# Patient Record
Sex: Male | Born: 1940
Health system: Southern US, Community
[De-identification: ages and names within clinical notes are randomized; demographics above are authoritative.]

## PROBLEM LIST (undated history)

## (undated) DIAGNOSIS — R6251 Failure to thrive (child): Secondary | ICD-10-CM

## (undated) DIAGNOSIS — C801 Malignant (primary) neoplasm, unspecified: Secondary | ICD-10-CM

## (undated) DIAGNOSIS — K5909 Other constipation: Secondary | ICD-10-CM

## (undated) DIAGNOSIS — D638 Anemia in other chronic diseases classified elsewhere: Secondary | ICD-10-CM

## (undated) DIAGNOSIS — S8292XA Unspecified fracture of left lower leg, initial encounter for closed fracture: Secondary | ICD-10-CM

## (undated) DIAGNOSIS — M109 Gout, unspecified: Secondary | ICD-10-CM

## (undated) DIAGNOSIS — R2681 Unsteadiness on feet: Secondary | ICD-10-CM

## (undated) DIAGNOSIS — N189 Chronic kidney disease, unspecified: Secondary | ICD-10-CM

## (undated) DIAGNOSIS — E1142 Type 2 diabetes mellitus with diabetic polyneuropathy: Secondary | ICD-10-CM

## (undated) DIAGNOSIS — S82899A Other fracture of unspecified lower leg, initial encounter for closed fracture: Secondary | ICD-10-CM

## (undated) DIAGNOSIS — E119 Type 2 diabetes mellitus without complications: Secondary | ICD-10-CM

## (undated) DIAGNOSIS — I10 Essential (primary) hypertension: Secondary | ICD-10-CM

## (undated) DIAGNOSIS — E785 Hyperlipidemia, unspecified: Secondary | ICD-10-CM

## (undated) HISTORY — DX: Morbid (severe) obesity due to excess calories: E66.01

## (undated) HISTORY — PX: PROSTATE SURGERY: SHX751

---

## 1997-08-11 ENCOUNTER — Ambulatory Visit (HOSPITAL_COMMUNITY): Admission: RE | Admit: 1997-08-11 | Discharge: 1997-08-11 | Payer: Self-pay | Admitting: Nephrology

## 1997-10-25 ENCOUNTER — Other Ambulatory Visit: Admission: RE | Admit: 1997-10-25 | Discharge: 1997-10-25 | Payer: Self-pay | Admitting: Nephrology

## 1999-09-26 ENCOUNTER — Encounter: Admission: RE | Admit: 1999-09-26 | Discharge: 1999-09-26 | Payer: Self-pay | Admitting: Nephrology

## 1999-09-26 ENCOUNTER — Encounter: Payer: Self-pay | Admitting: Nephrology

## 2000-06-09 ENCOUNTER — Emergency Department (HOSPITAL_COMMUNITY): Admission: EM | Admit: 2000-06-09 | Discharge: 2000-06-09 | Payer: Self-pay | Admitting: Emergency Medicine

## 2000-09-25 ENCOUNTER — Encounter: Payer: Self-pay | Admitting: Urology

## 2000-09-26 ENCOUNTER — Observation Stay: Admission: RE | Admit: 2000-09-26 | Discharge: 2000-09-27 | Payer: Self-pay | Admitting: Urology

## 2003-07-13 ENCOUNTER — Encounter: Admission: RE | Admit: 2003-07-13 | Discharge: 2003-07-13 | Payer: Self-pay | Admitting: Nephrology

## 2004-03-10 ENCOUNTER — Emergency Department (HOSPITAL_COMMUNITY): Admission: EM | Admit: 2004-03-10 | Discharge: 2004-03-10 | Payer: Self-pay | Admitting: Emergency Medicine

## 2004-03-10 IMAGING — CR DG KNEE COMPLETE 4+V*L*
4 series · 4 of 4 positions shown · non-contrast
Comparison: none

CLINICAL DATA: Left knee pain.

[view not recorded (1 of 4)]
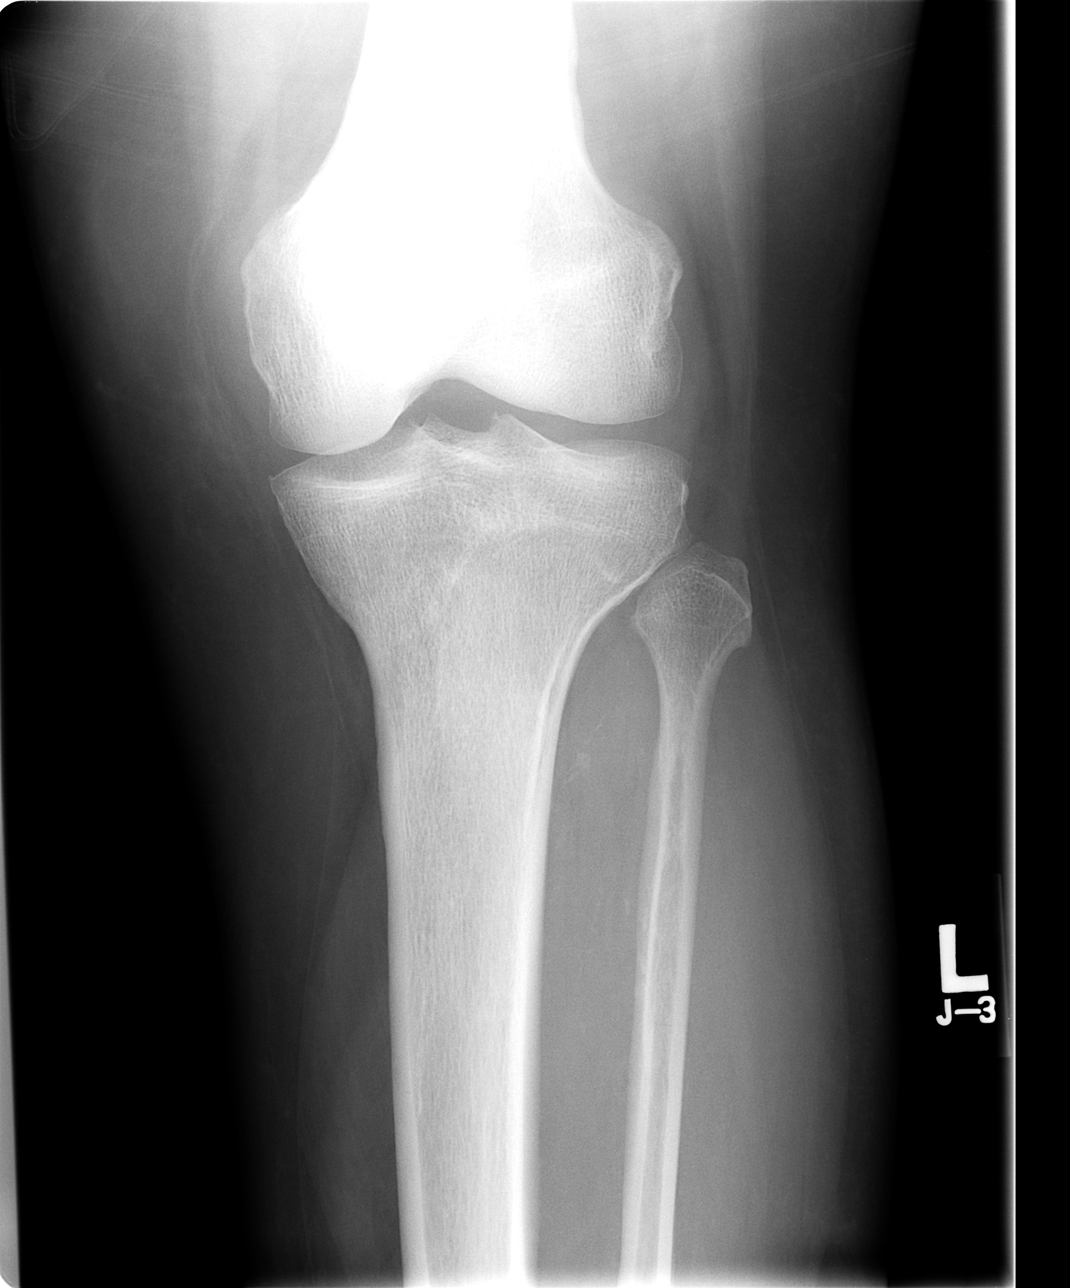

[view not recorded (2 of 4)]
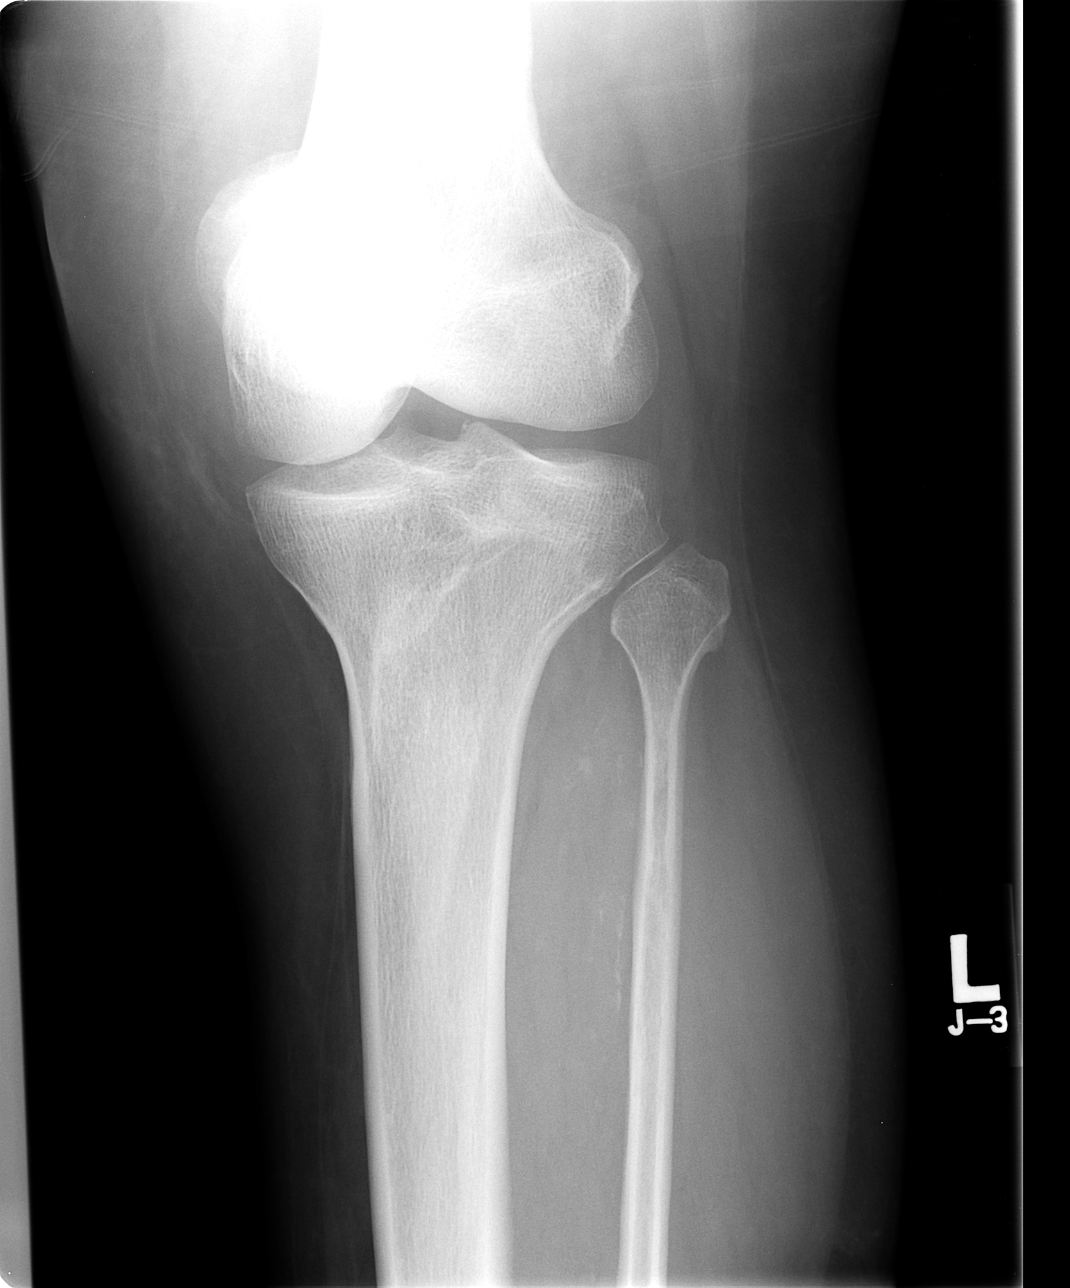

[view not recorded (3 of 4)]
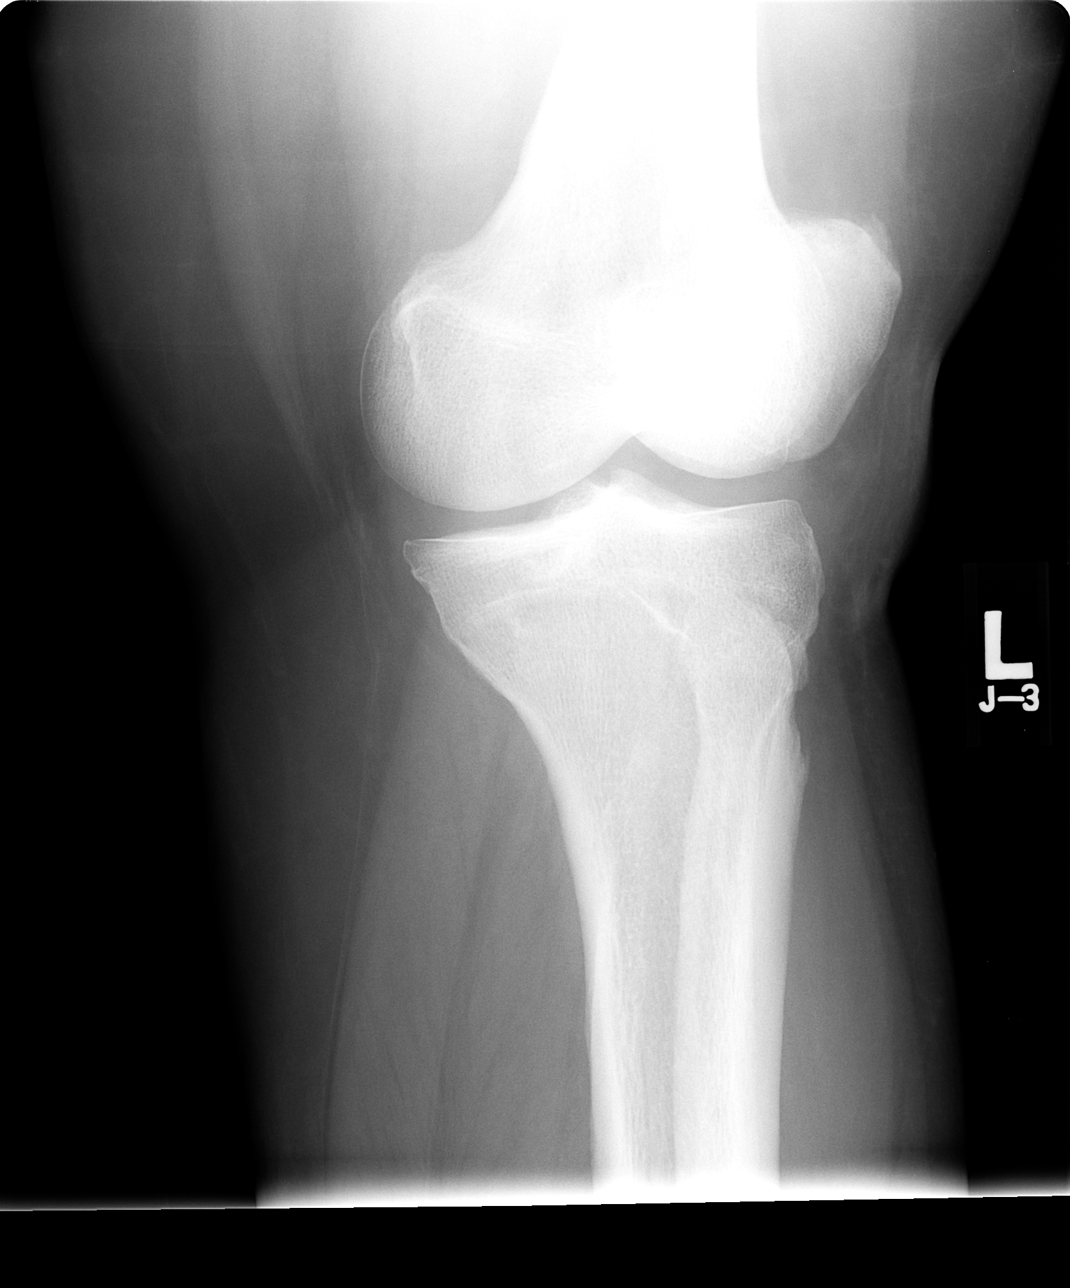

[view not recorded (4 of 4)]
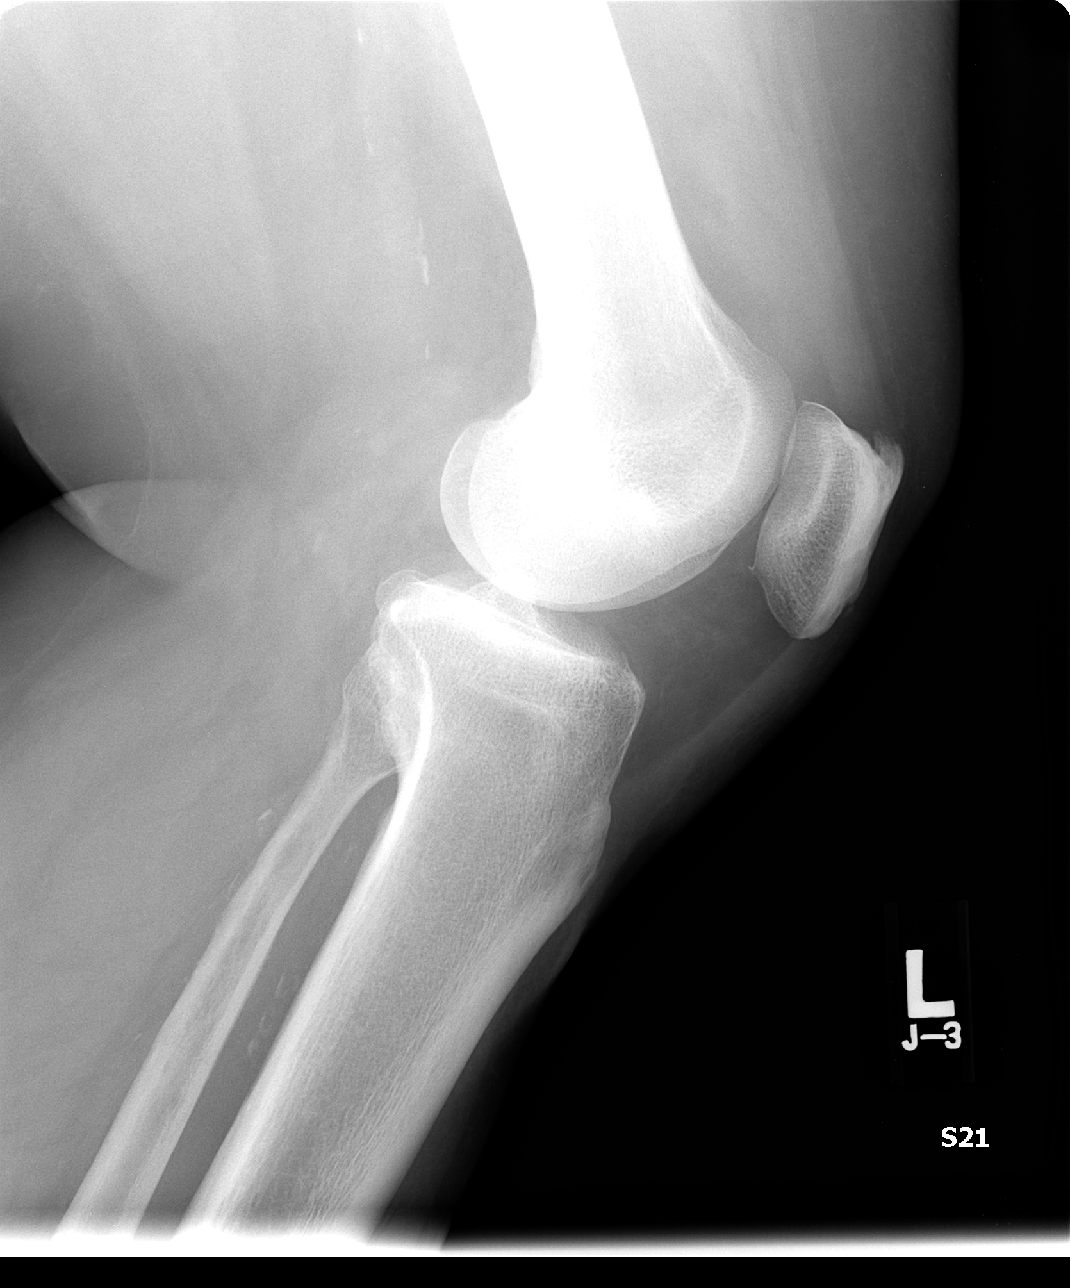

[4 of 4 positions shown; findings below may reference images not displayed]

LEFT KNEE - 4 VIEW:

There is no evidence of acute fracture or dislocation. A small knee joint
effusion is seen. Mild degenerative spurring of the tibial spines, medial
tibial plateau, and patella are seen without evidence of joint space narrowing. 
Mild peripheral vascular calcification is also seen.
IMPRESSION: 1. No evidence of fracture or dislocation. 

2. Small knee joint effusion.

3. Early degenerative spurring.

## 2004-04-03 ENCOUNTER — Inpatient Hospital Stay (HOSPITAL_COMMUNITY): Admission: AD | Admit: 2004-04-03 | Discharge: 2004-04-07 | Payer: Self-pay | Admitting: Nephrology

## 2004-04-03 IMAGING — CR DG KNEE 1-2V*L*
2 series · 2 of 2 positions shown · non-contrast
Comparison: none

CLINICAL DATA: Left knee pain.  No injury.  History of diabetes and gout. 
 LEFT KNEE:
 Two views of the left knee were obtained and compared to films of [DATE]. There has been an increase in volume of the left knee joint effusion.  No acute bony abnormality is seen.  On the lateral view, there is some cortical irregularity to the proximal anterior tibia near the insertion of the patellar tendon. This could be due to arthritis such as gout.

[view not recorded (1 of 2)]
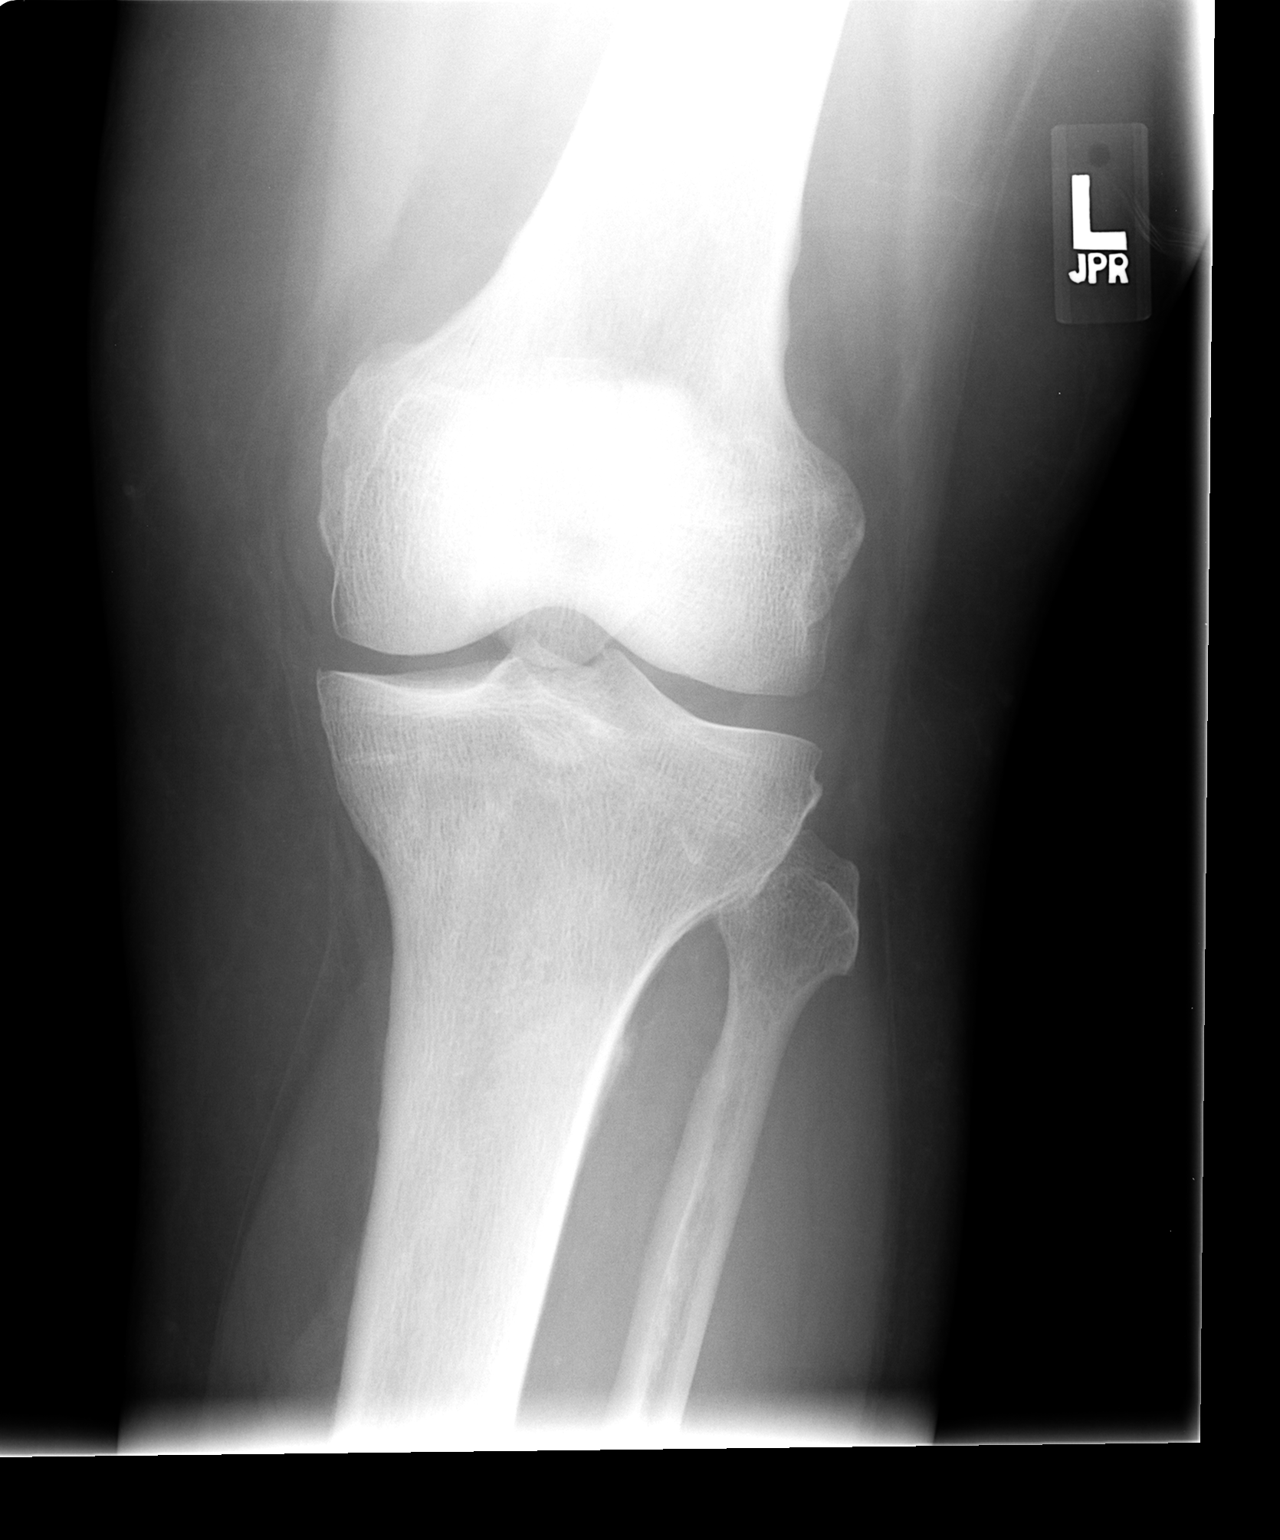

[view not recorded (2 of 2)]
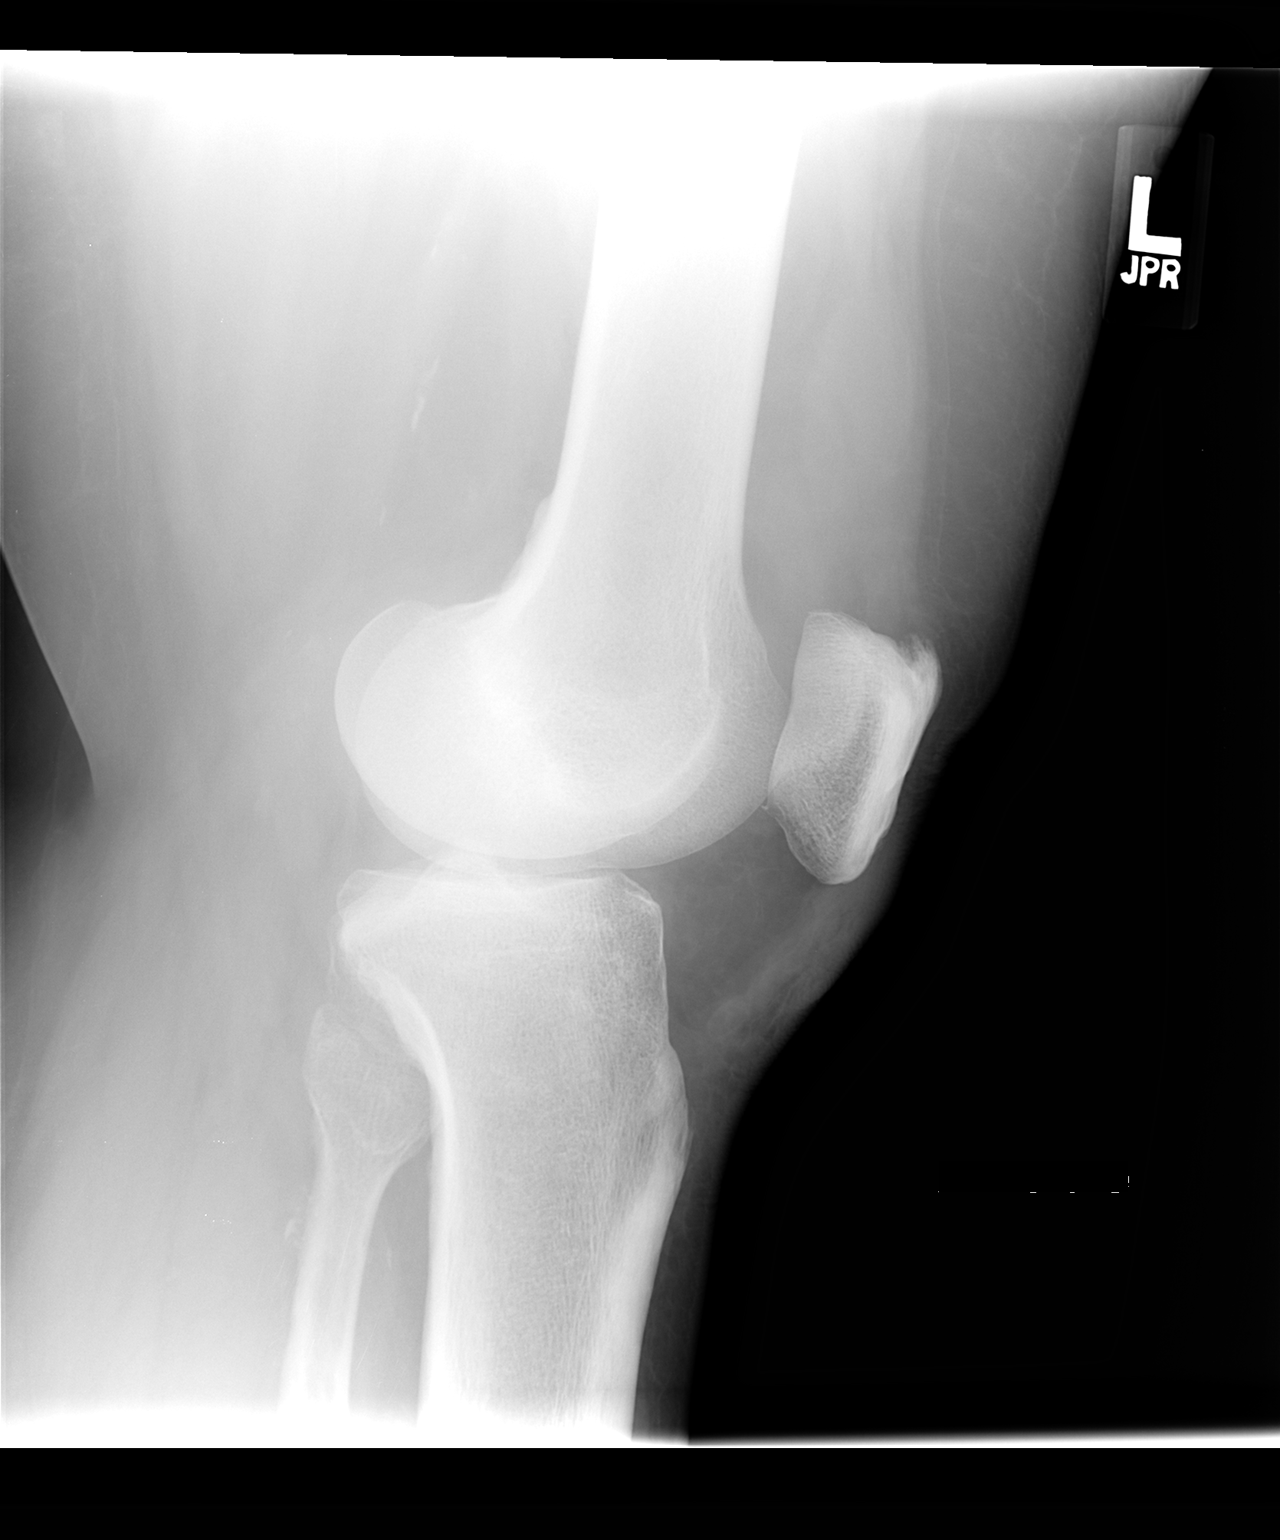

[2 of 2 positions shown; findings below may reference images not displayed]

IMPRESSION: 1.  Increase in left knee joint effusion.  
 2.  Questionable erosion of the proximal anterior tibia near the insertion of the patellar tendon.  Question gout.

## 2004-04-03 IMAGING — CR DG CHEST 2V
2 series · 2 of 2 positions shown · non-contrast
Comparison: none

CLINICAL DATA: Short of breath.  
 CHEST - TWO VIEW:
 Two views of the chest show no active process.  The heart is within normal limits in size.  No bony abnormality is seen.

[view not recorded (1 of 2)]
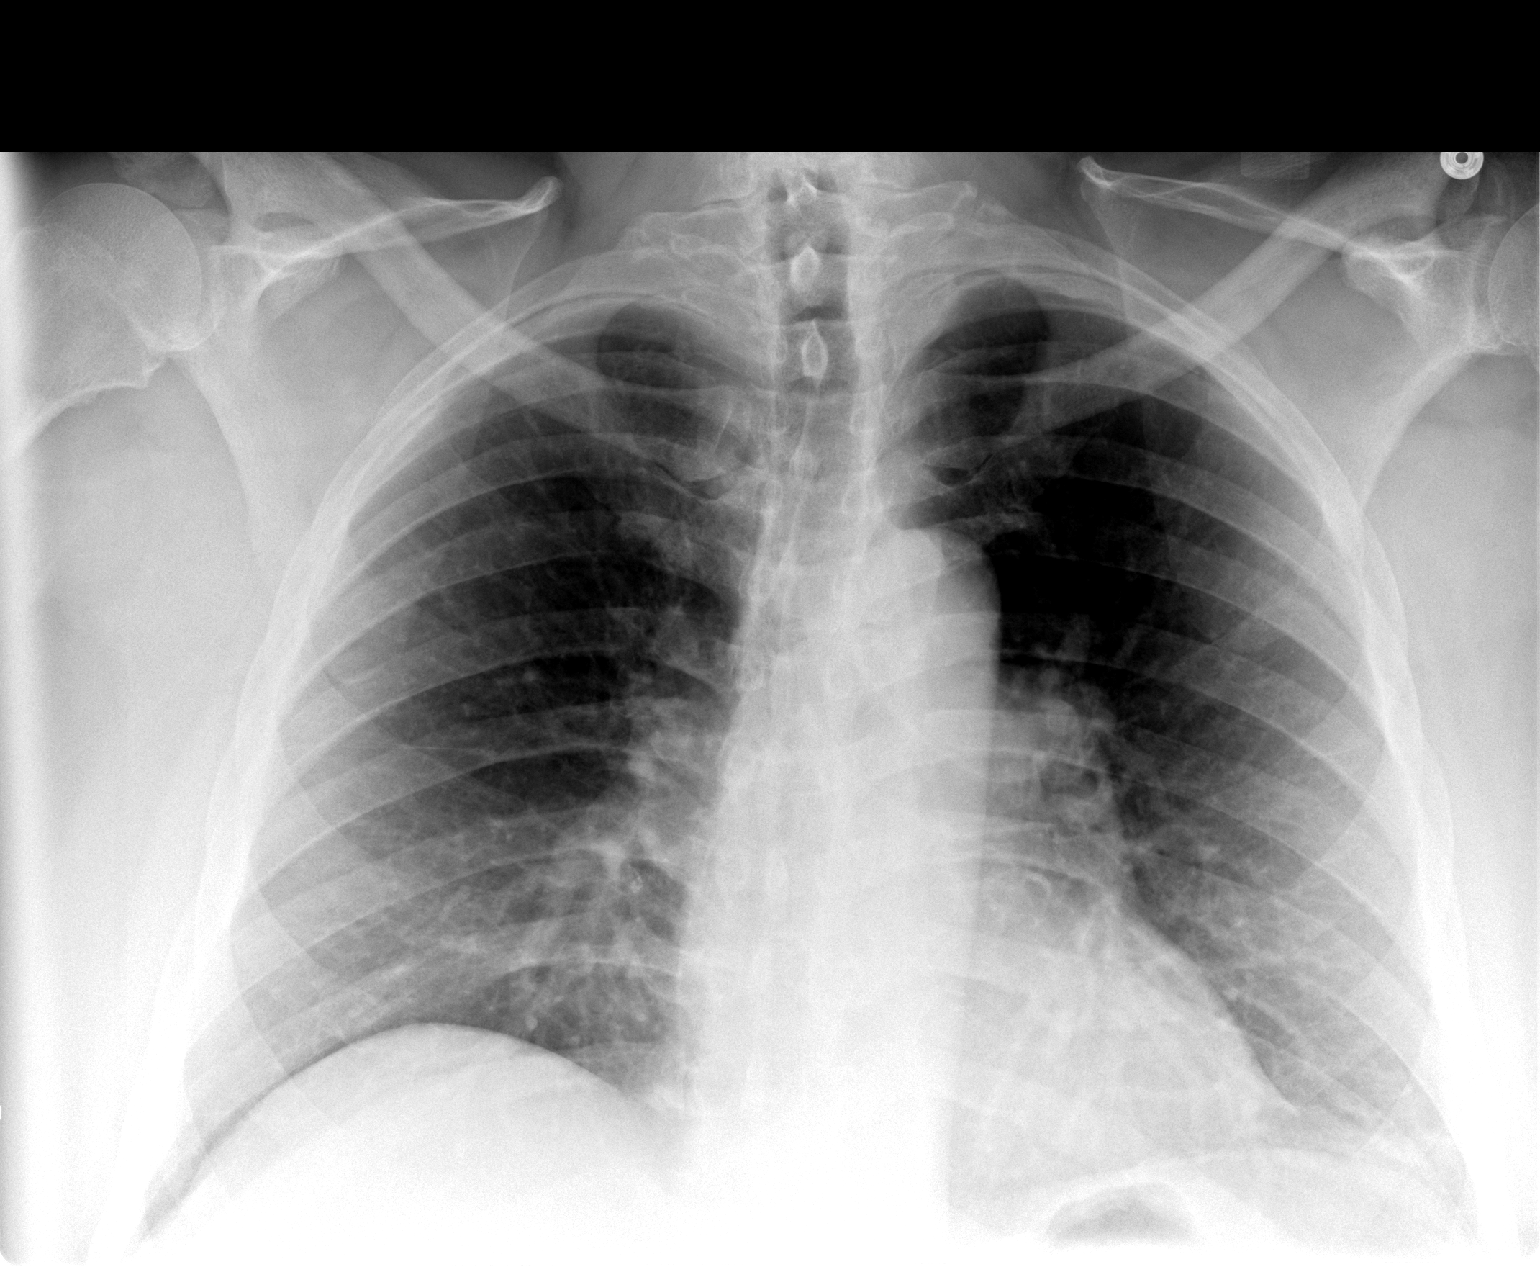

[view not recorded (2 of 2)]
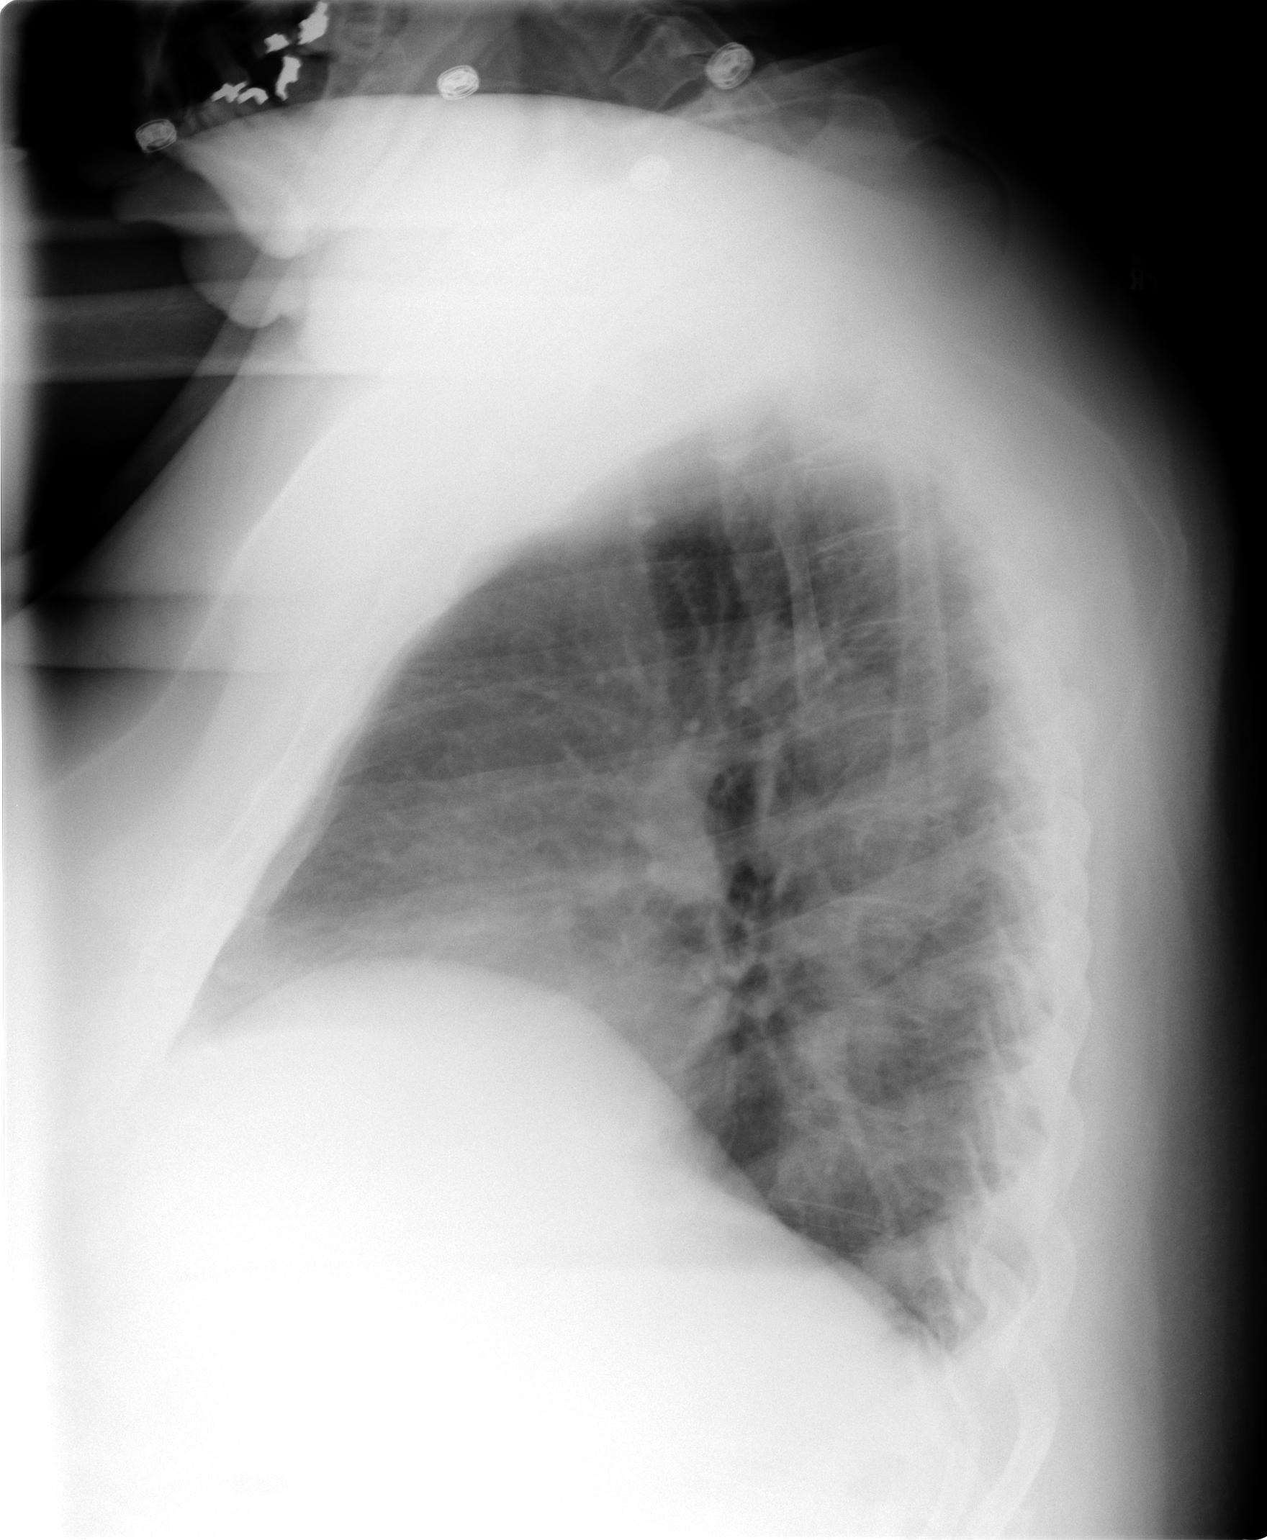

[2 of 2 positions shown; findings below may reference images not displayed]

IMPRESSION: No active lung disease.

## 2004-08-24 ENCOUNTER — Encounter: Admission: RE | Admit: 2004-08-24 | Discharge: 2004-08-24 | Payer: Self-pay | Admitting: Nephrology

## 2004-08-24 IMAGING — CR DG CHEST 2V
2 series · 2 of 2 positions shown · non-contrast
Comparison: [DATE].

CLINICAL DATA: Cough, congestion.
 CHEST, TWO VIEWS:

[w chest pa]
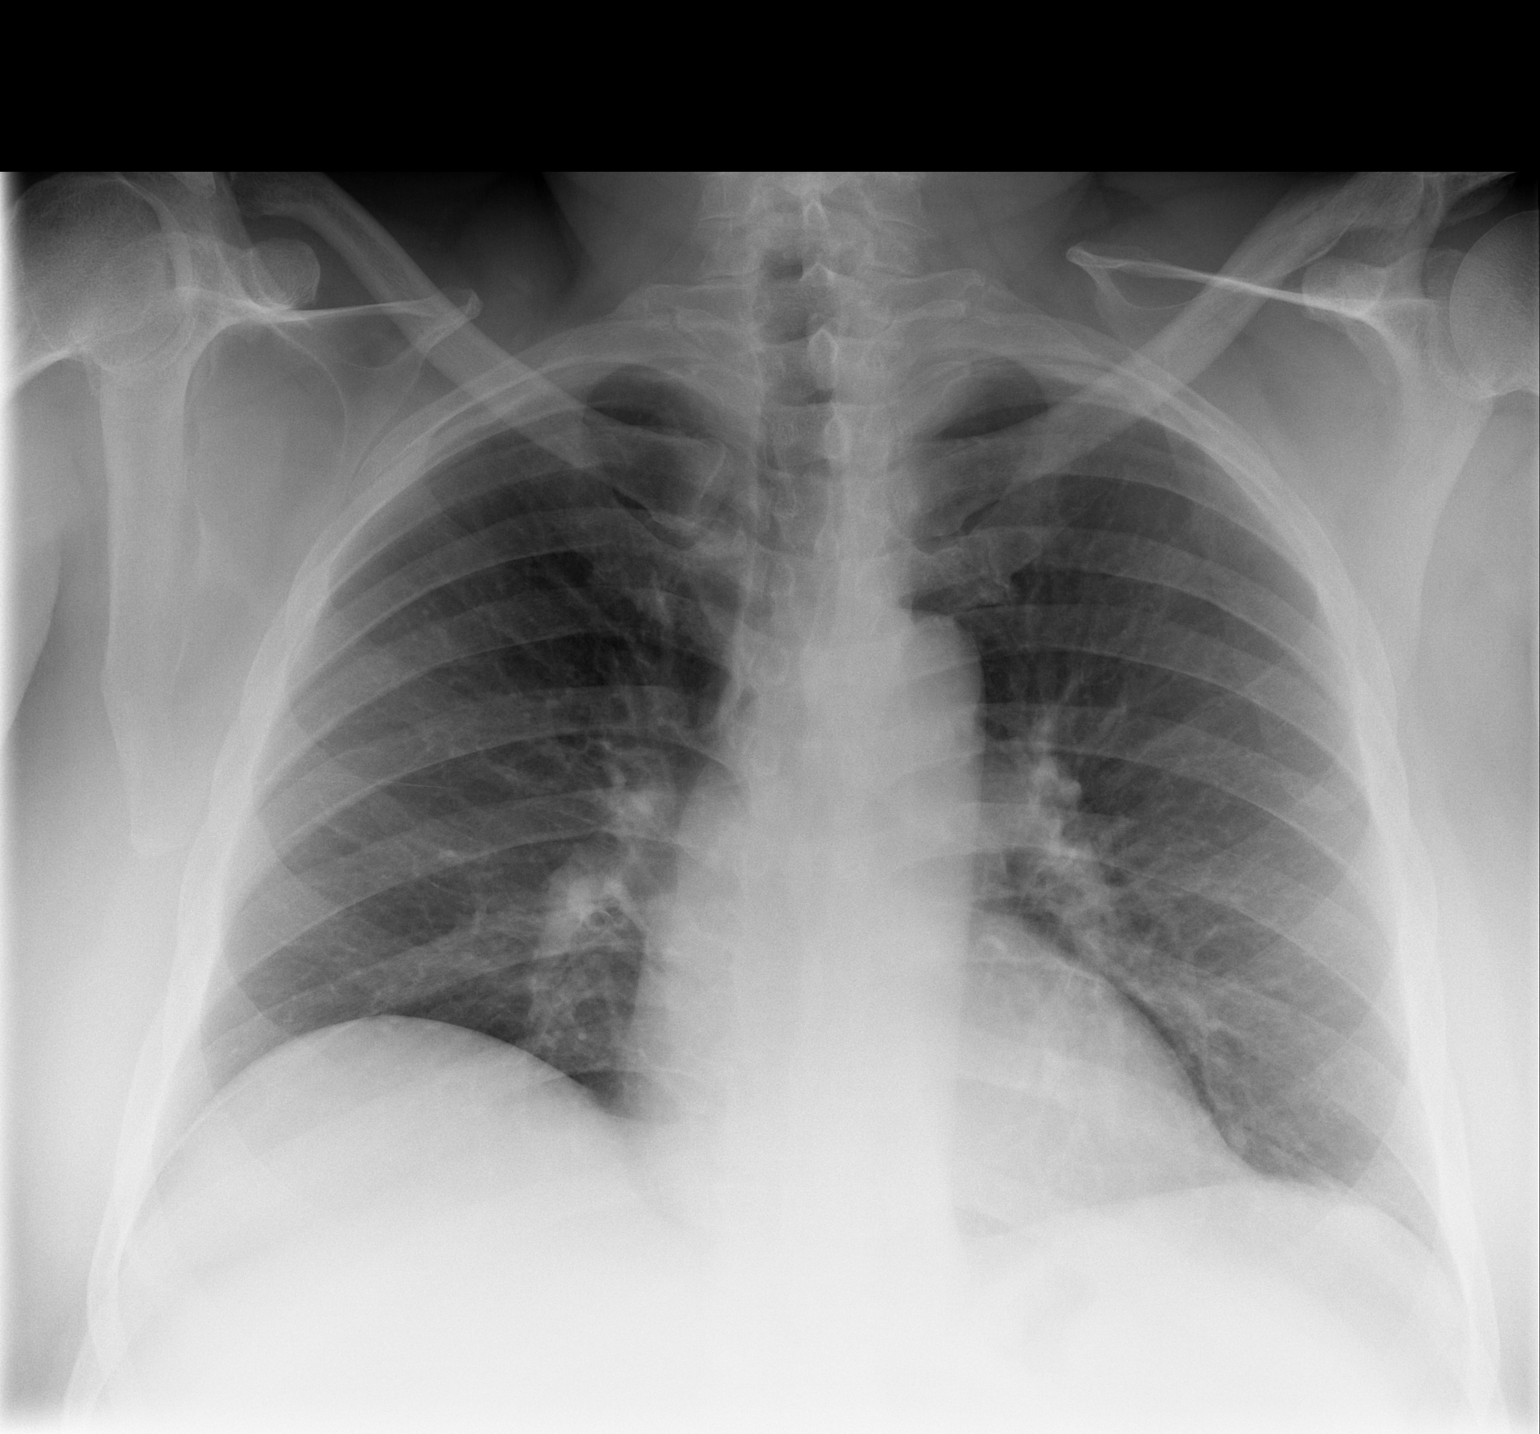

[w chest lat *]
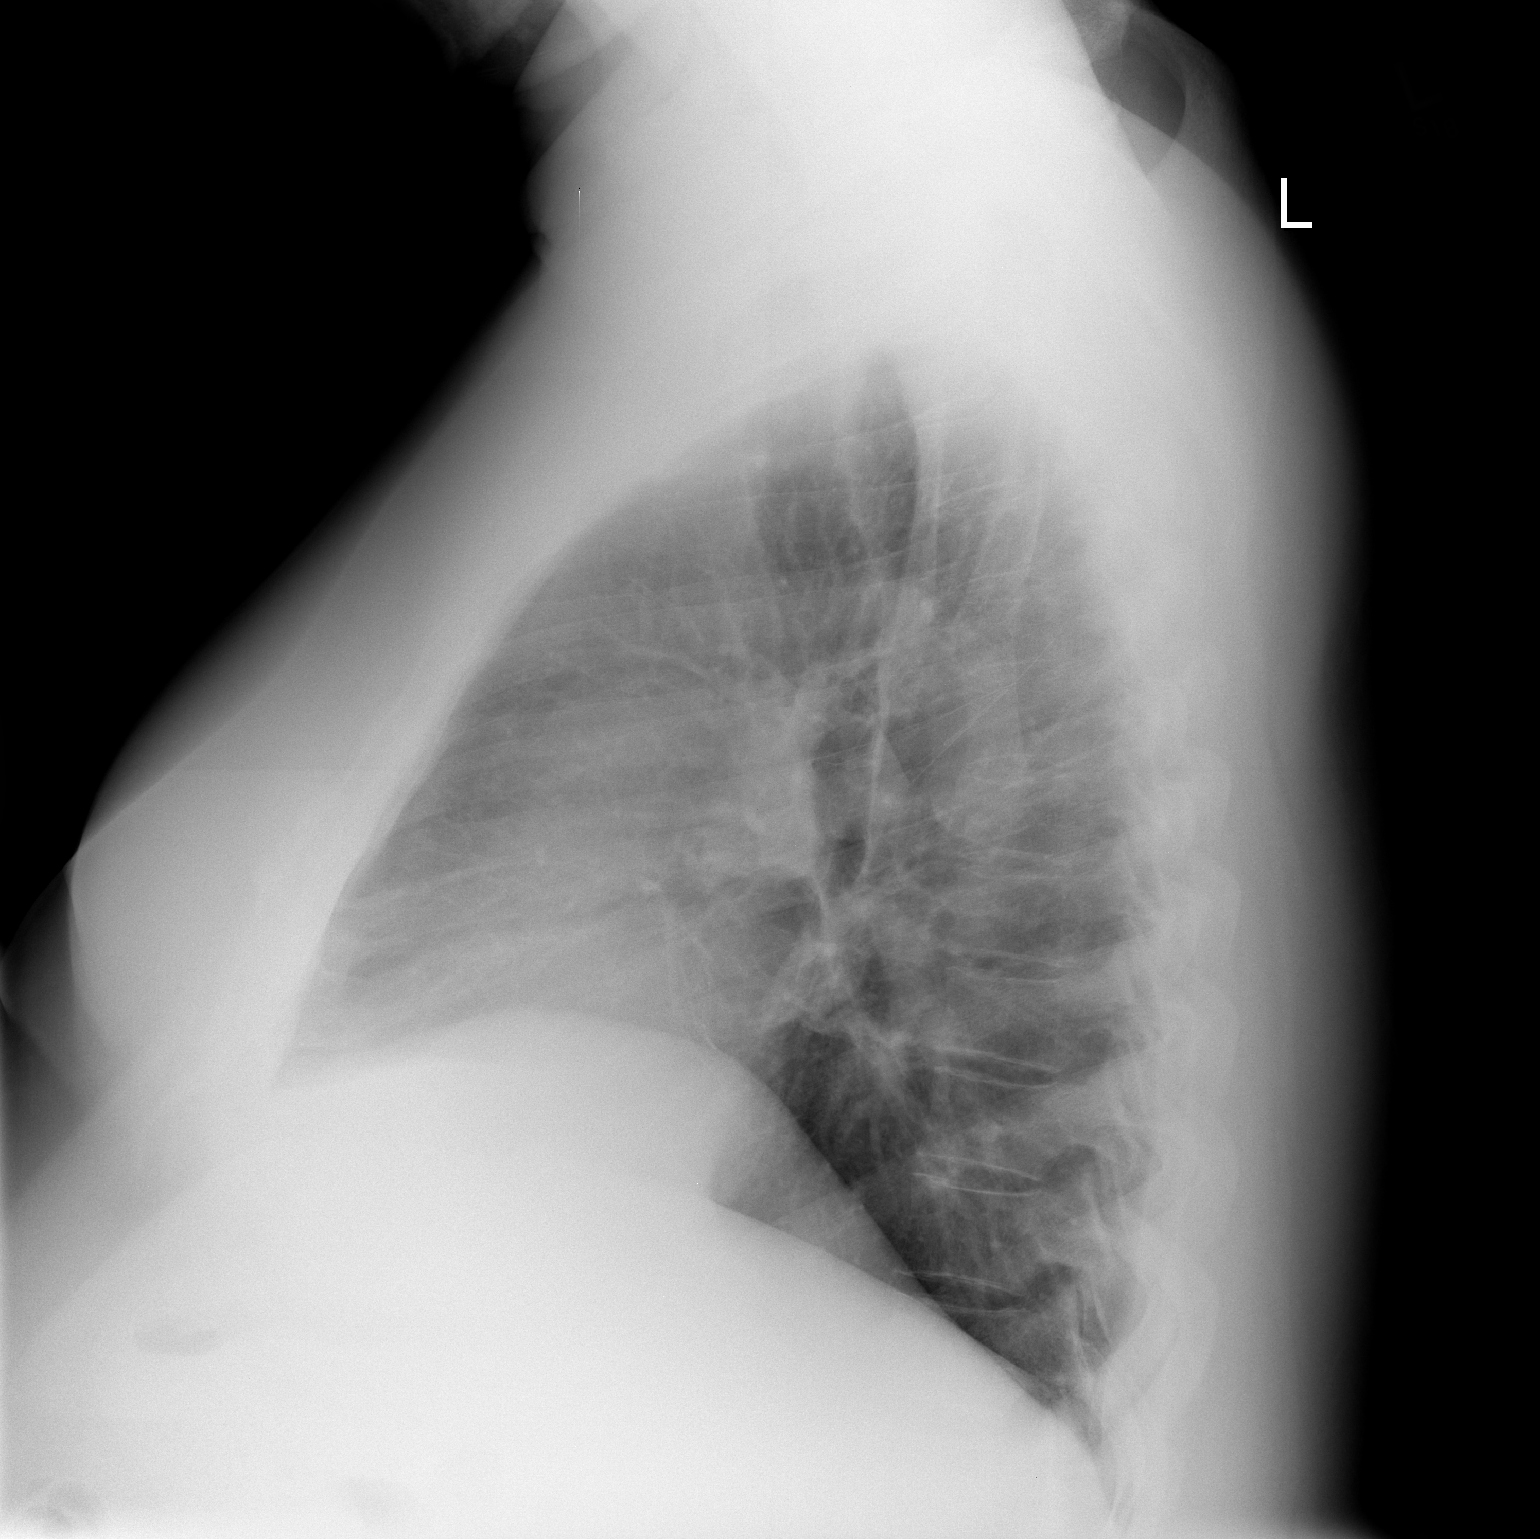

[2 of 2 positions shown; findings below may reference images not displayed]

FINDINGS: Heart and lungs essentially normal. Mild peribronchial thickening but no acute process.  No pleural fluid or osseous lesions.
IMPRESSION: Peribronchial thickening ? no active disease.

## 2004-09-08 ENCOUNTER — Encounter (HOSPITAL_COMMUNITY): Admission: RE | Admit: 2004-09-08 | Discharge: 2004-12-07 | Payer: Self-pay | Admitting: Nephrology

## 2007-10-10 ENCOUNTER — Emergency Department (HOSPITAL_COMMUNITY): Admission: EM | Admit: 2007-10-10 | Discharge: 2007-10-11 | Payer: Self-pay | Admitting: Emergency Medicine

## 2007-10-11 IMAGING — CR DG CHEST 2V
3 series · 3 of 3 positions shown · non-contrast
Comparison: [DATE]

CLINICAL DATA: Cough and congestion

CHEST - 2 VIEW

[w chest lat * (1 of 2)]
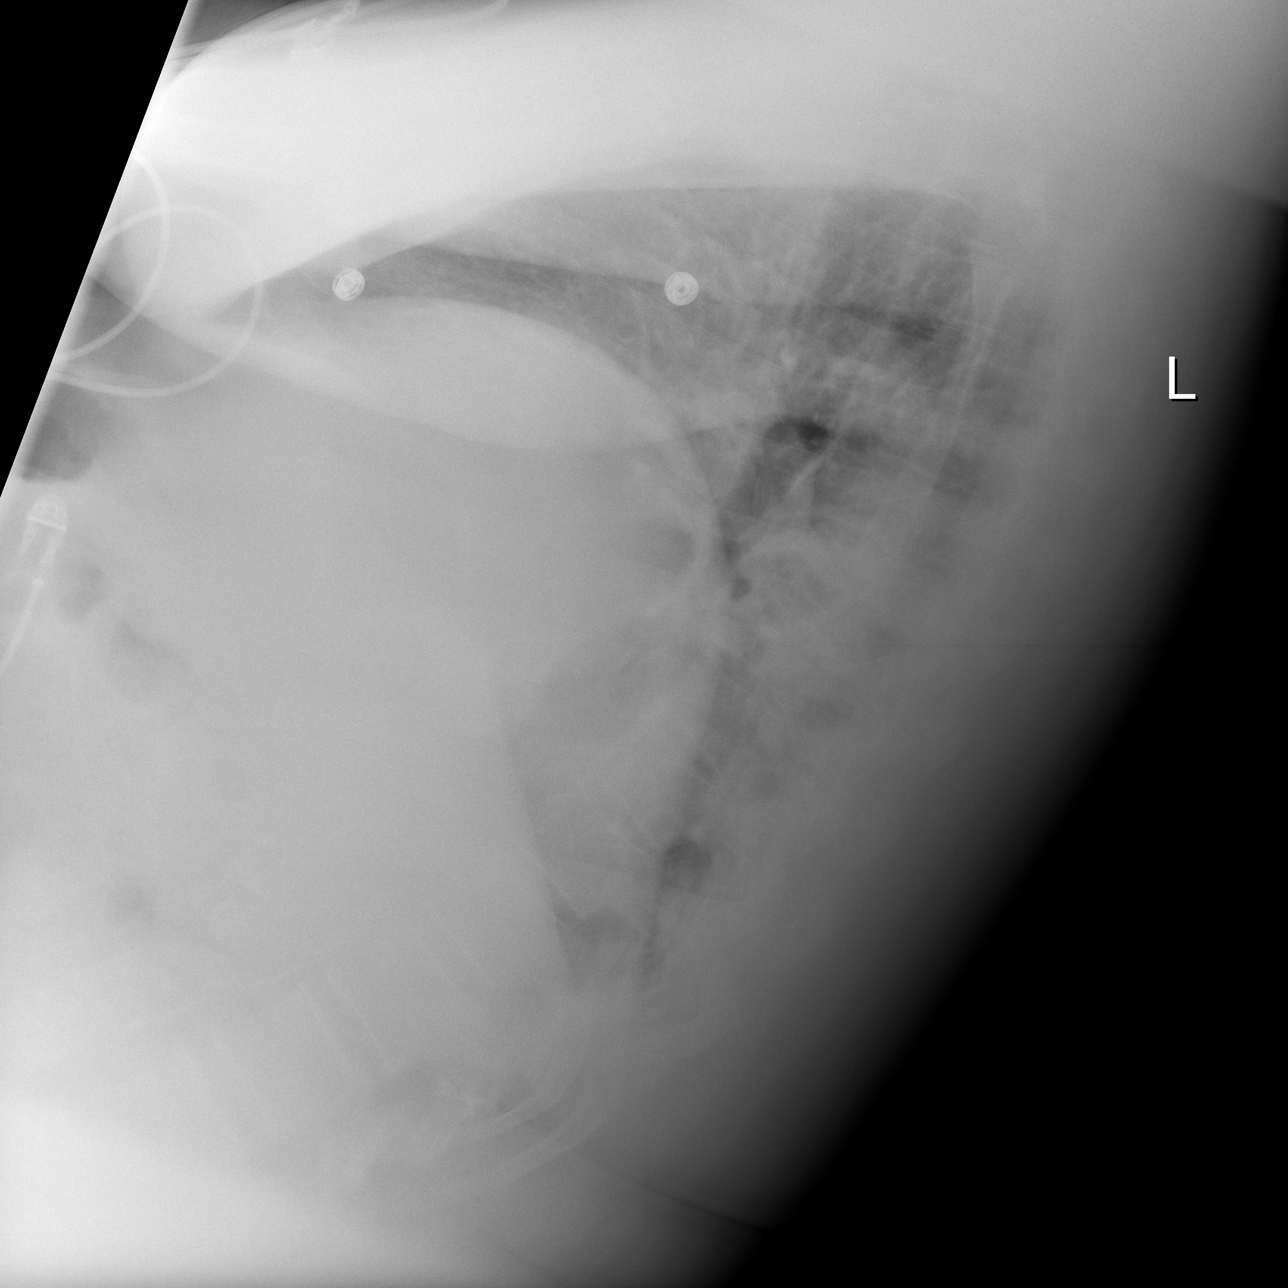

[w chest lat * (2 of 2)]
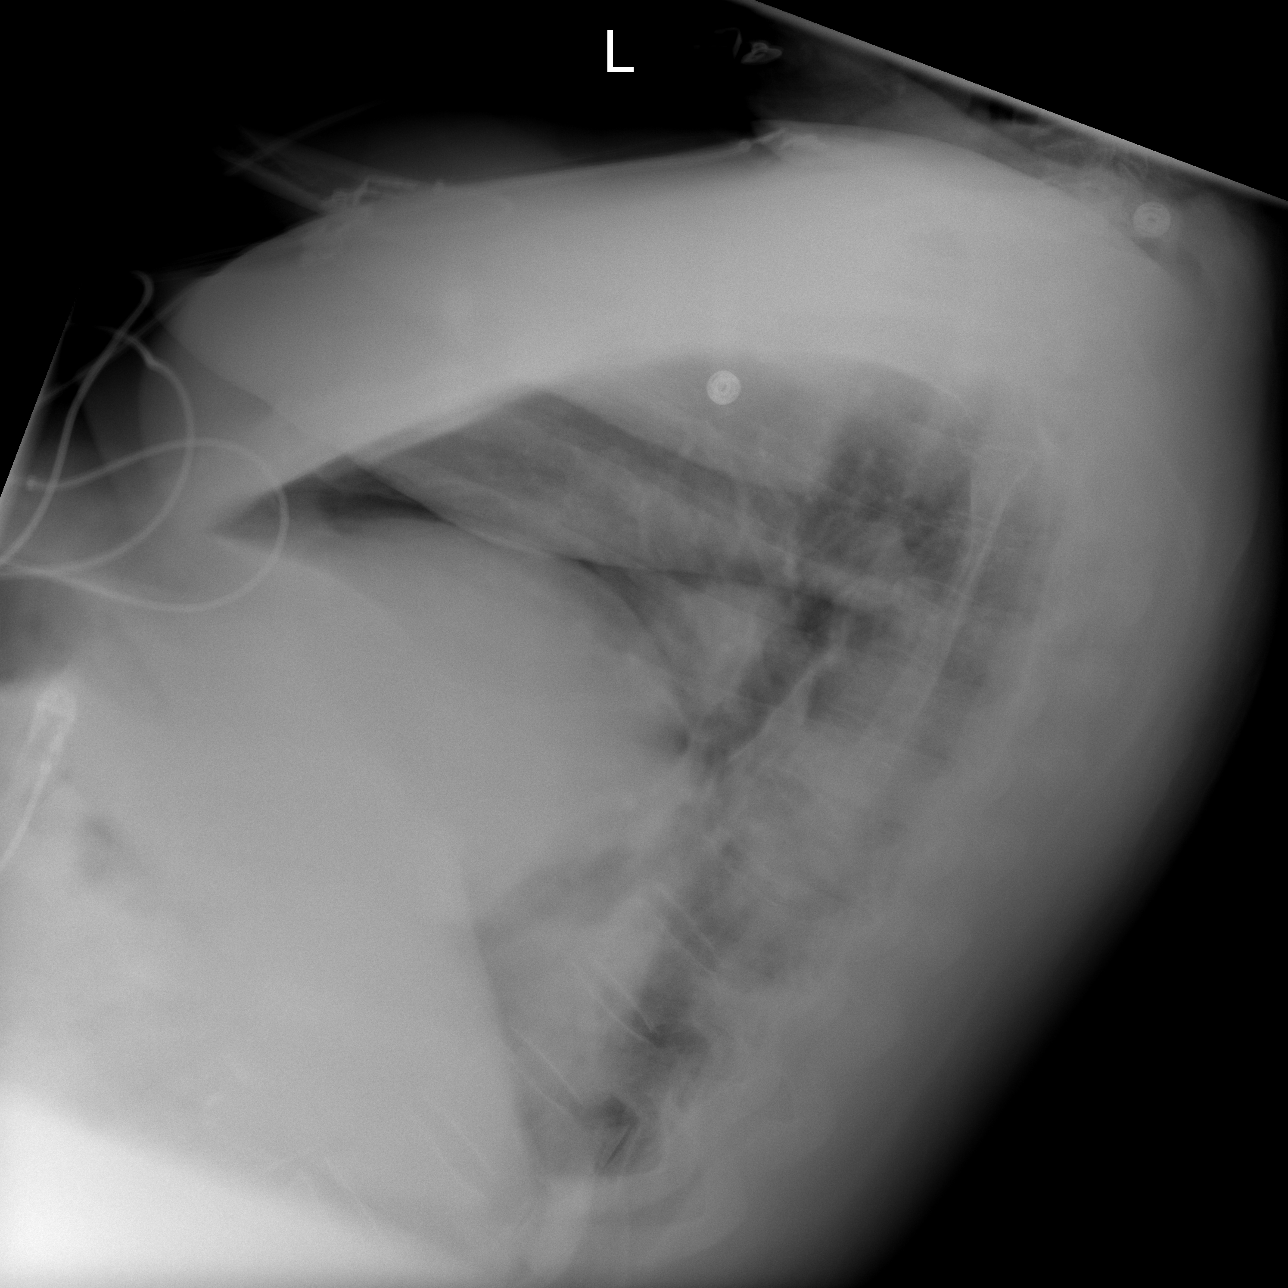

[view not recorded]
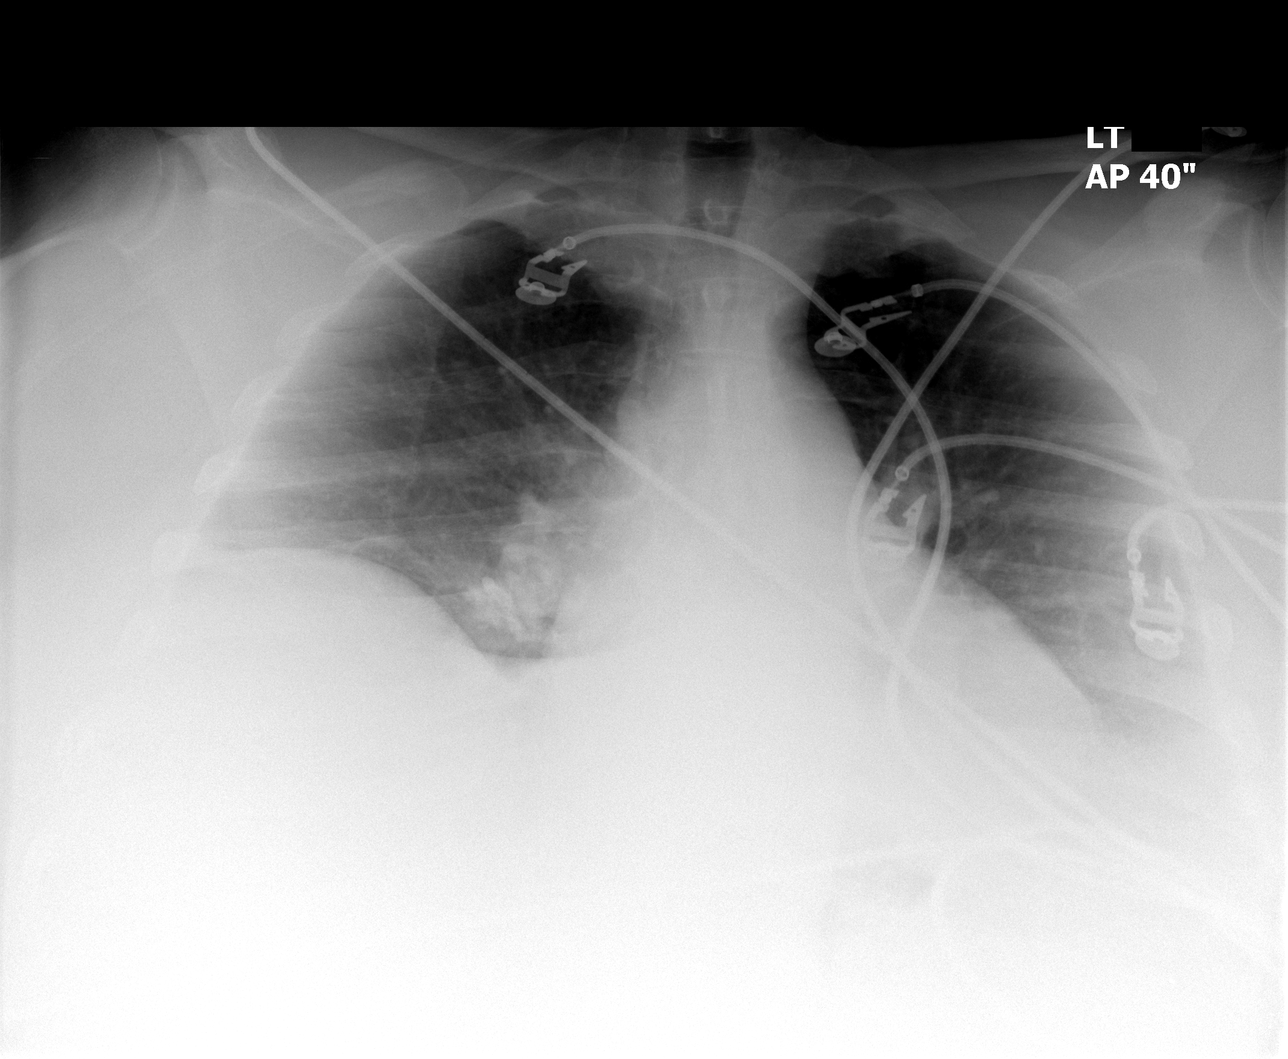

[3 of 3 positions shown; findings below may reference images not displayed]

FINDINGS: Low volumes with resultant crowding of bronchovascular
structures.  No confluent airspace infiltrate.  Mild apparent
cardiomegaly probably emphasized by low volumes and technique.
Hilar prominence probably also related to low volumes and
technique, although   adenopathy cannot be confidently excluded.
Visualized bones unremarkable.
IMPRESSION: 1.  Low lung volumes.  No convincing acute disease.

## 2007-11-05 ENCOUNTER — Encounter: Admission: RE | Admit: 2007-11-05 | Discharge: 2007-11-05 | Payer: Self-pay | Admitting: Nephrology

## 2007-11-05 IMAGING — CR DG CHEST 2V
2 series · 2 of 2 positions shown · non-contrast
Comparison: [DATE]

CLINICAL DATA: Short of breath.  Hypertension.  Diabetes.

CHEST - 2 VIEW

[view not recorded (1 of 2)]
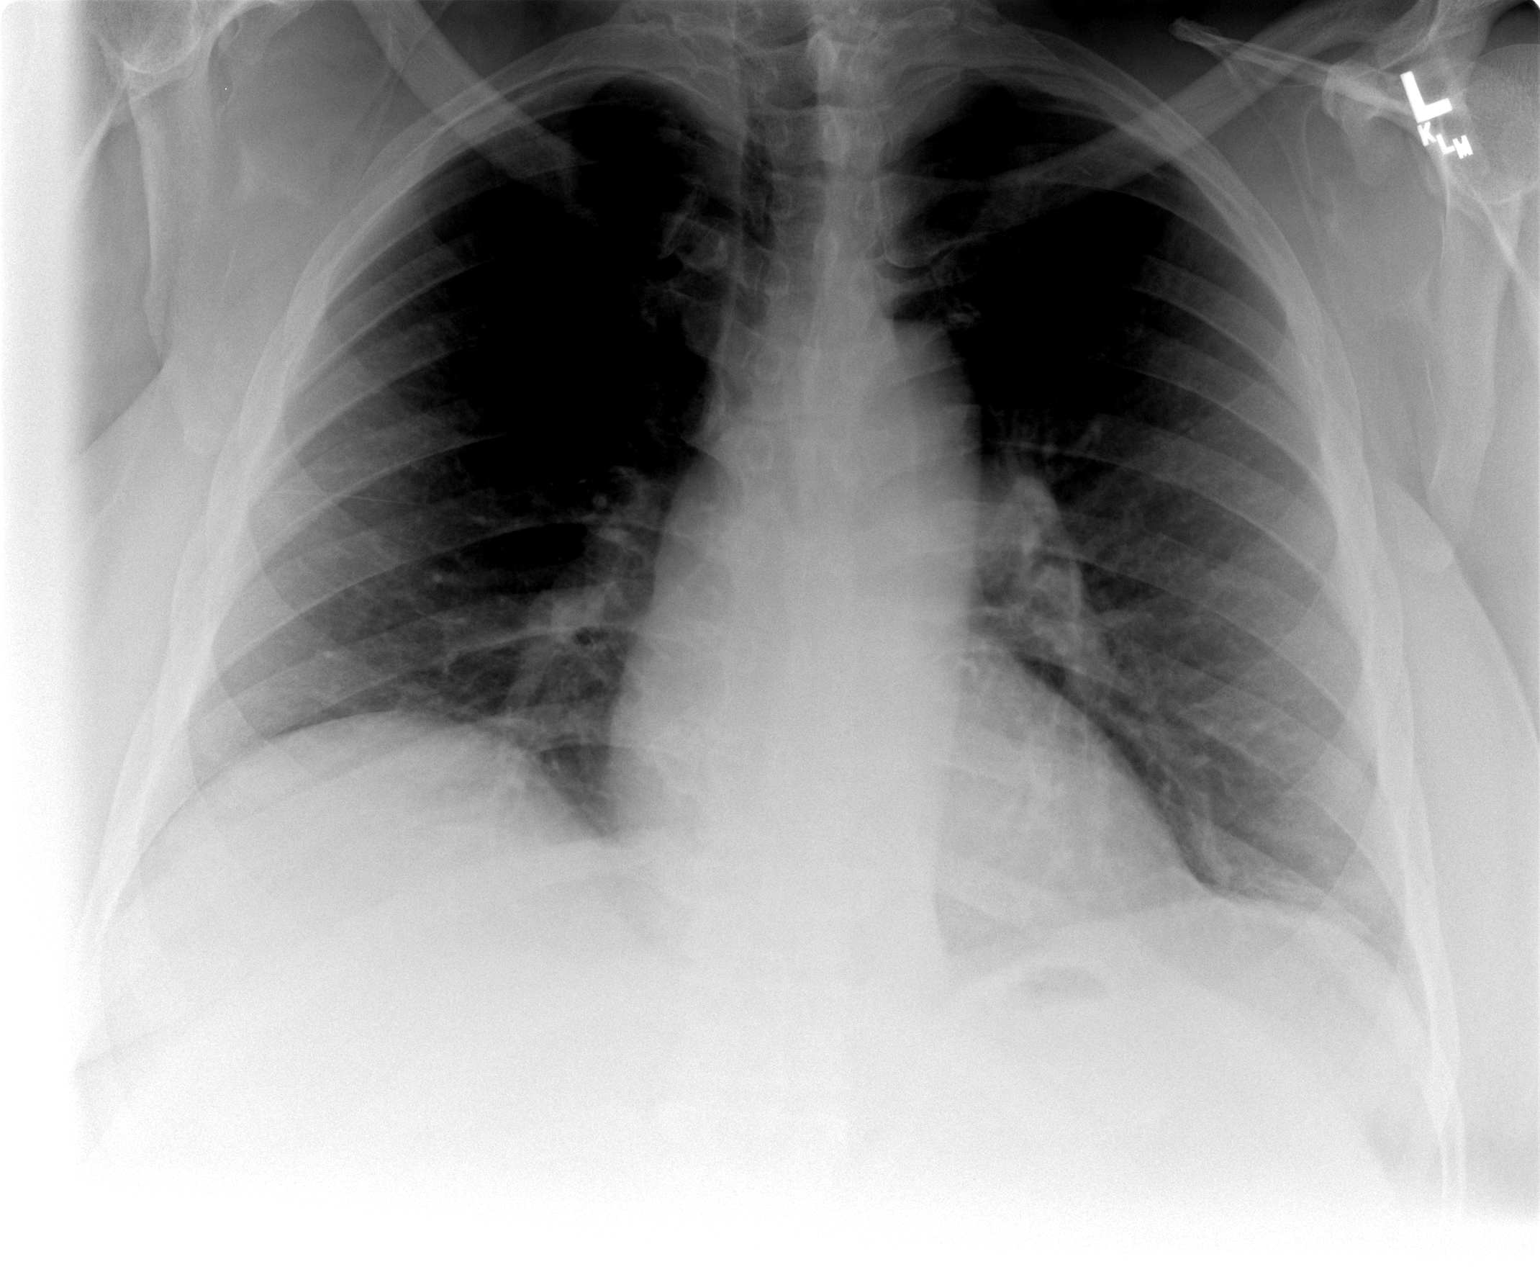

[view not recorded (2 of 2)]
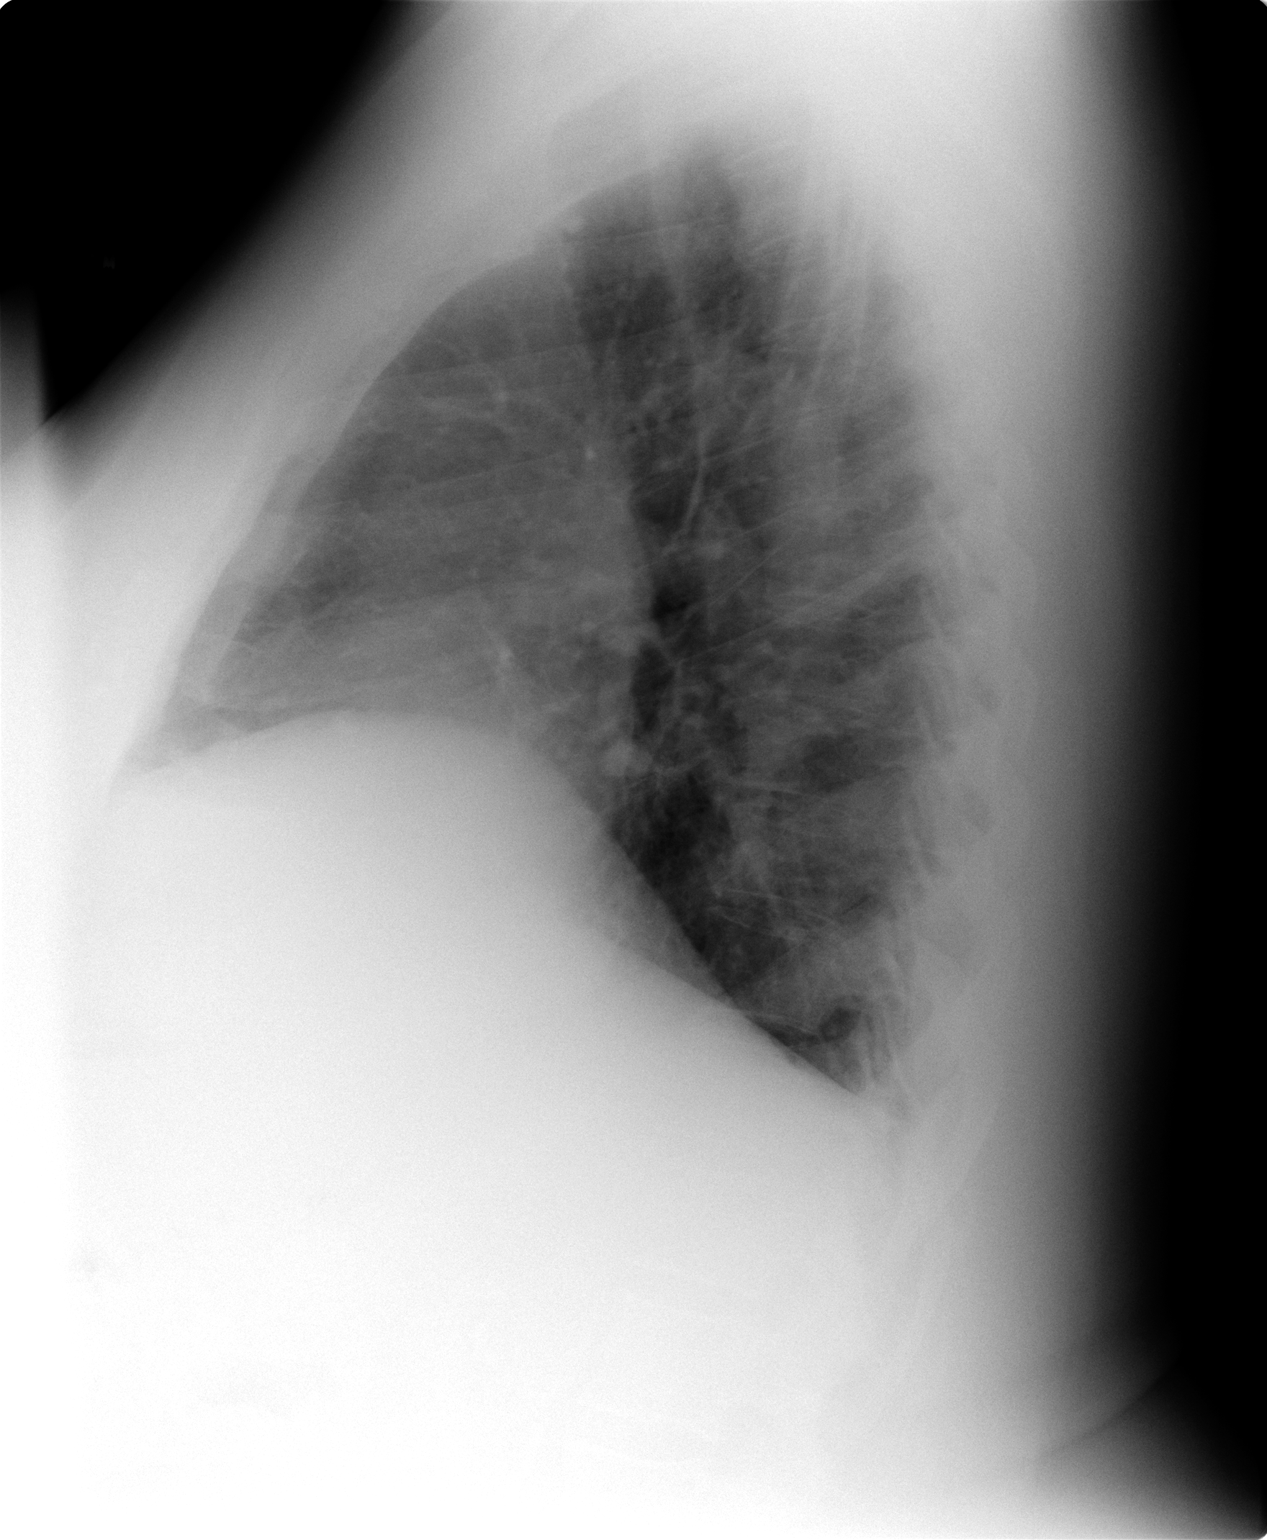

[2 of 2 positions shown; findings below may reference images not displayed]

FINDINGS: Heart size is normal.  The mediastinum is unremarkable.
There is minimal scarring or atelectasis at the left lung base.  No
infiltrate, lobar collapse or effusion.  Bony structures are
unremarkable.
IMPRESSION: Minimal linear atelectasis or scar at the left base.

## 2007-11-25 ENCOUNTER — Ambulatory Visit: Payer: Self-pay | Admitting: Vascular Surgery

## 2007-11-25 ENCOUNTER — Ambulatory Visit (HOSPITAL_COMMUNITY): Admission: RE | Admit: 2007-11-25 | Discharge: 2007-11-25 | Payer: Self-pay | Admitting: Nephrology

## 2007-11-25 ENCOUNTER — Encounter (INDEPENDENT_AMBULATORY_CARE_PROVIDER_SITE_OTHER): Payer: Self-pay | Admitting: Nephrology

## 2008-10-28 ENCOUNTER — Encounter: Admission: RE | Admit: 2008-10-28 | Discharge: 2008-10-28 | Payer: Self-pay | Admitting: Nephrology

## 2008-10-28 IMAGING — CR DG HIP (WITH OR WITHOUT PELVIS) 2-3V*L*
2 series · 2 of 2 positions shown · non-contrast
Comparison: None

CLINICAL DATA: Left-sided pain, history of prostate carcinoma

LEFT HIP - COMPLETE 2+ VIEW

[t hip ap left]
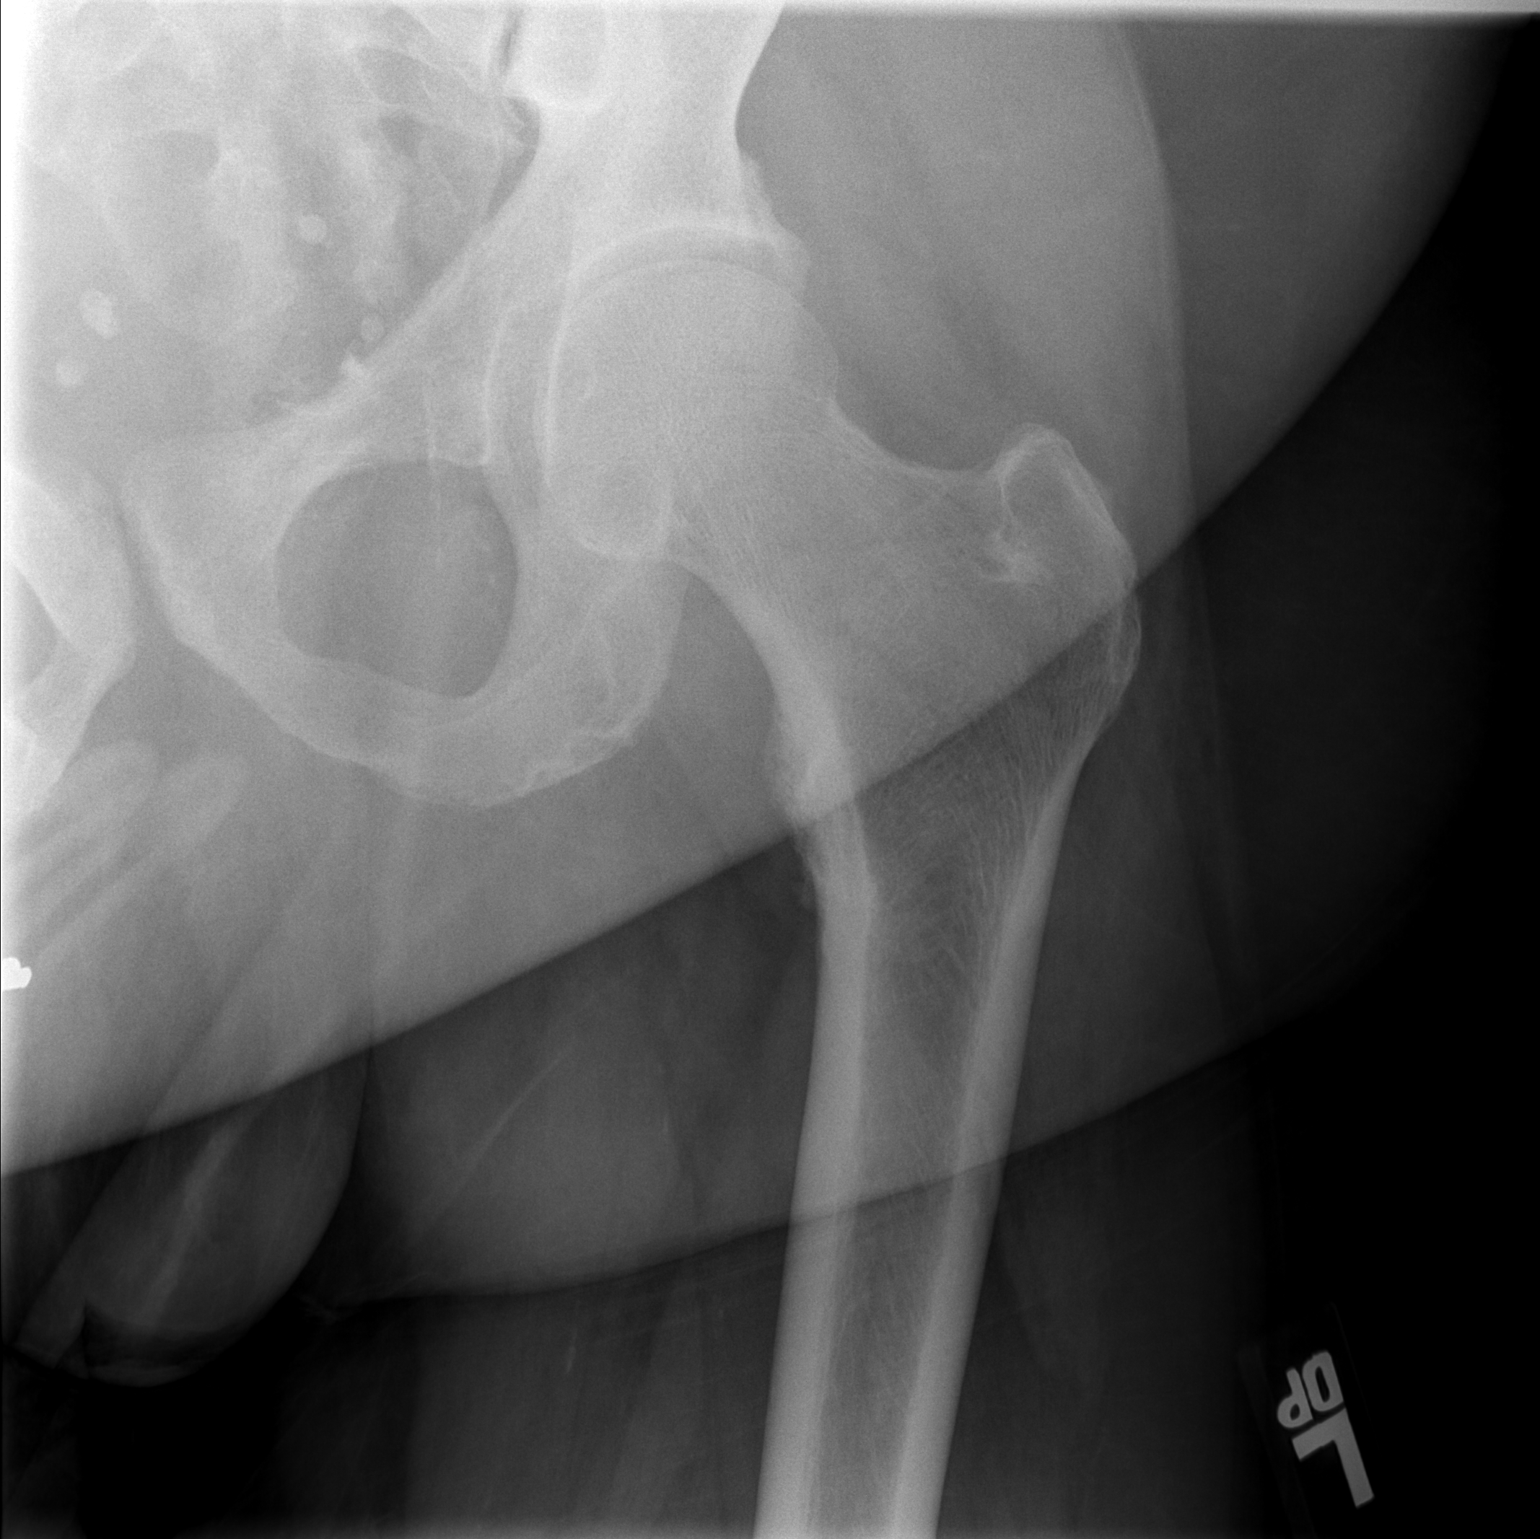

[t hip frog leg left]
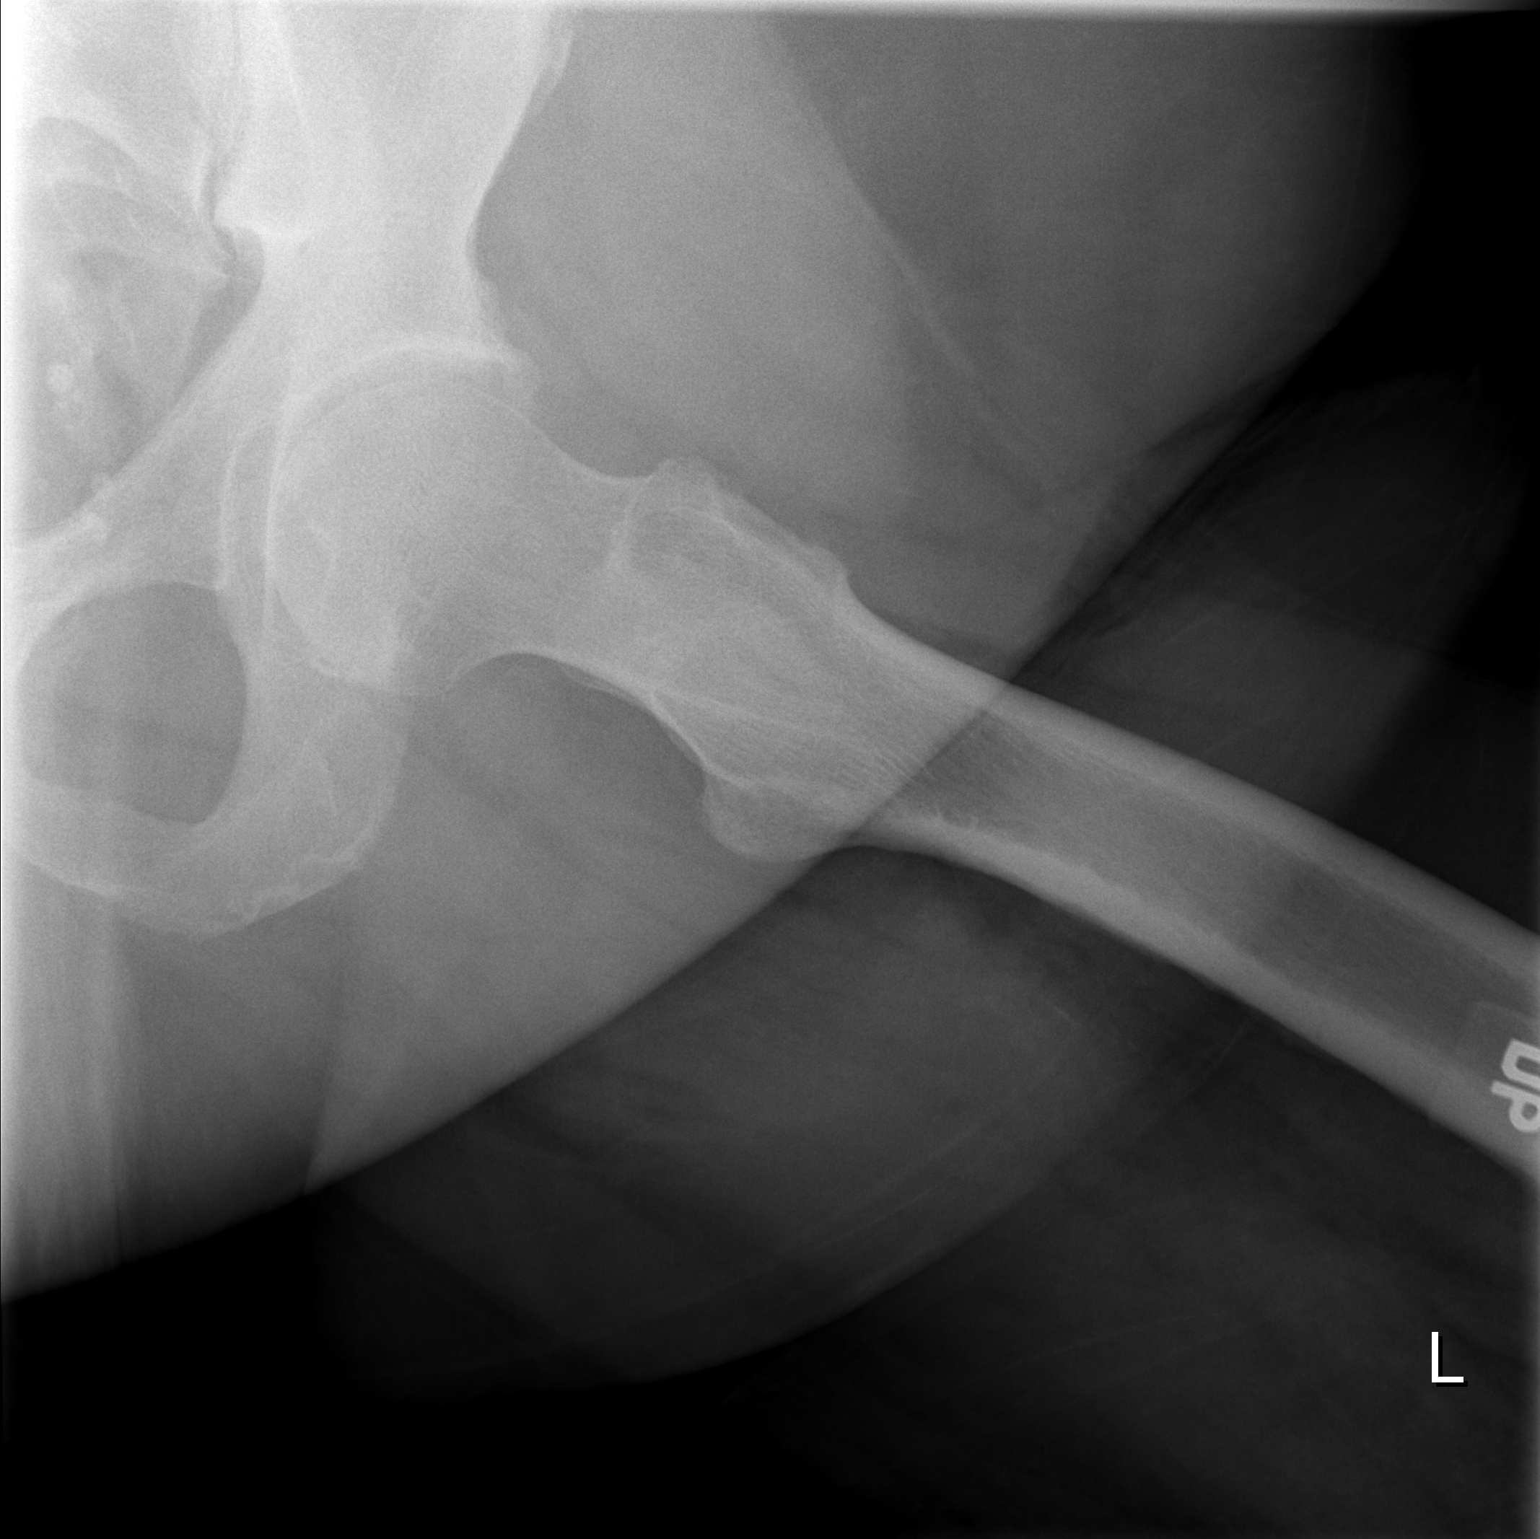

[2 of 2 positions shown; findings below may reference images not displayed]

FINDINGS: The left hip joint space is normal with only minimal
degenerative spurring from the superior acetabulum.  No acute
abnormality is seen.  The left ramus is intact.
IMPRESSION: Mild degenerative change.  No acute abnormality.

## 2008-12-18 IMAGING — CR DG LUMBAR SPINE COMPLETE 4+V
6 series · 6 of 6 positions shown · non-contrast
Comparison: None

CLINICAL DATA: Left-sided back pain, history of prostate carcinoma

LUMBAR SPINE - COMPLETE 4+ VIEW

[t l-spine a.p.]
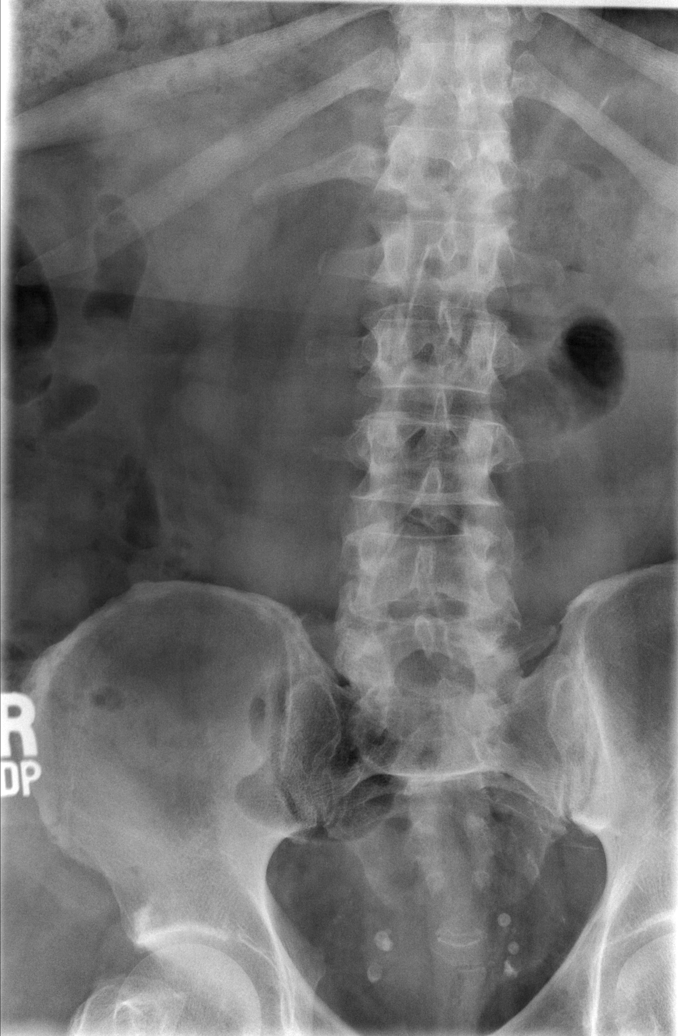

[t l-spine oblique exposure (1 of 3)]
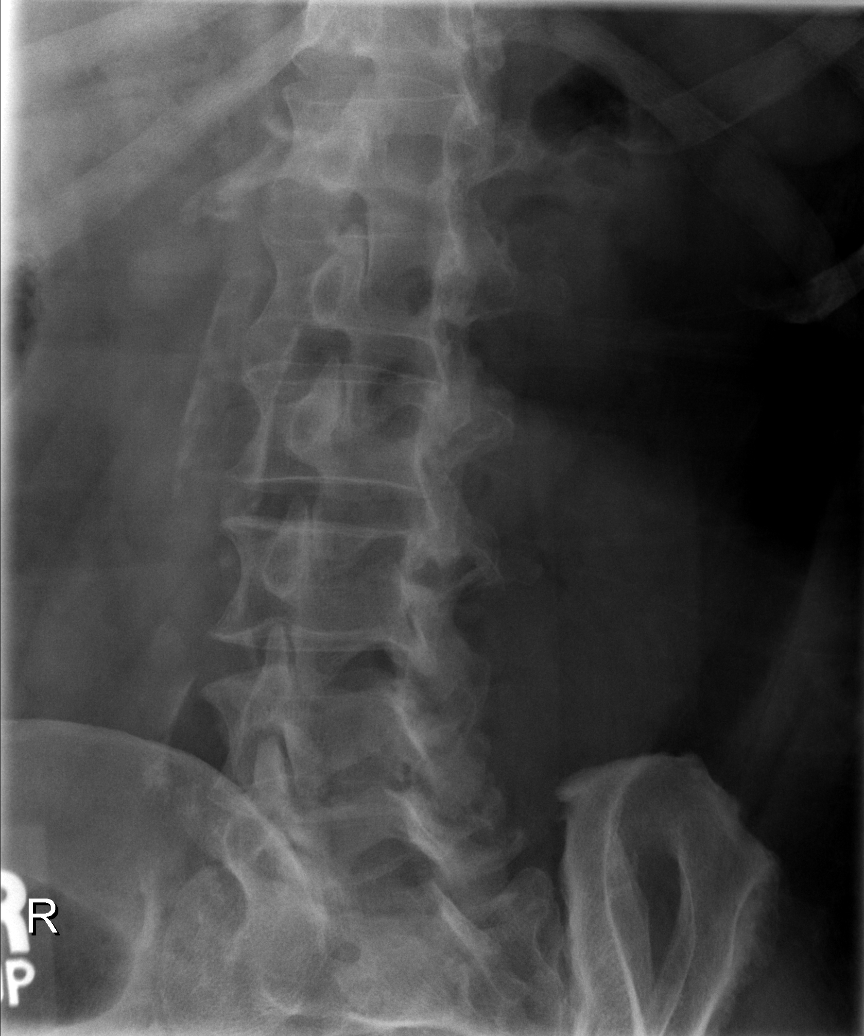

[t l-spine oblique exposure (2 of 3)]
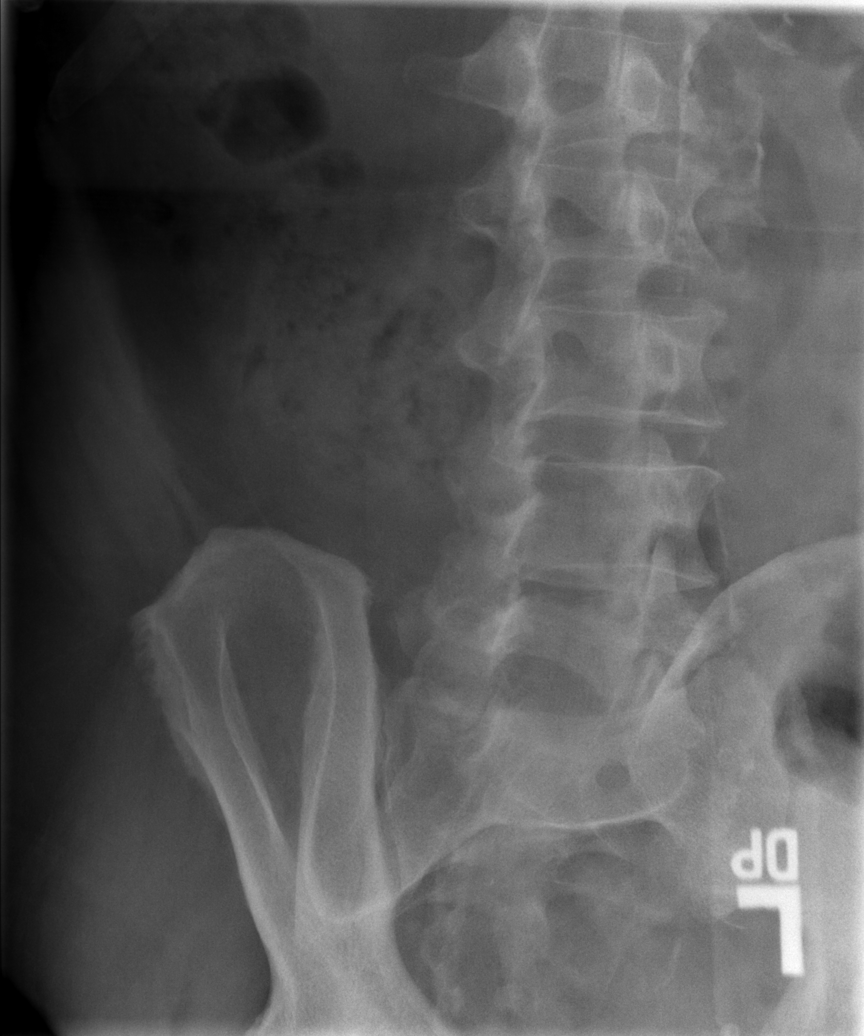

[t l-spine oblique exposure (3 of 3)]
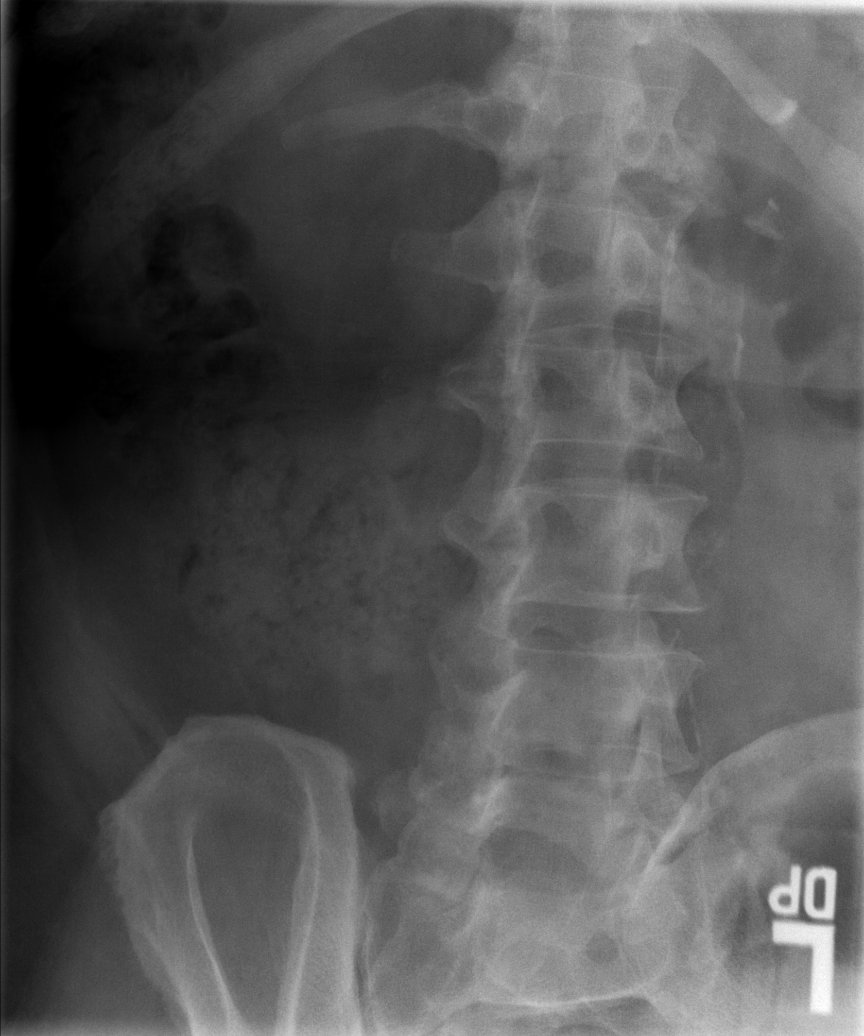

[t l-spine lat]
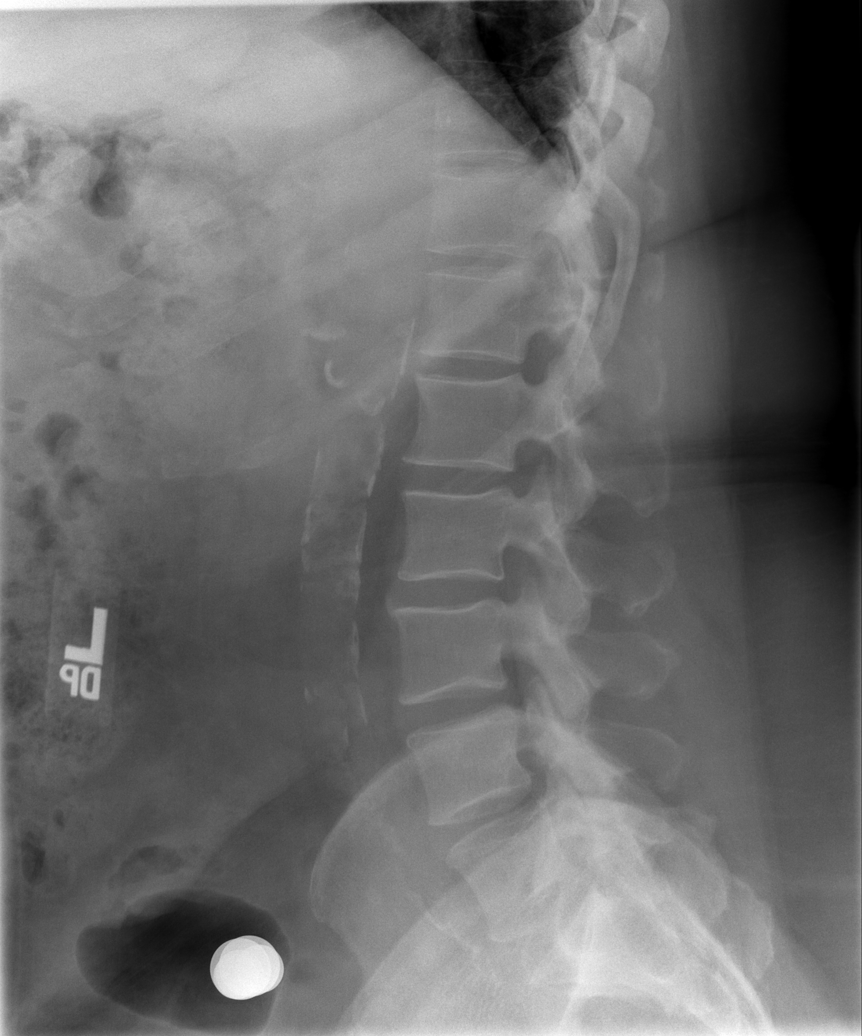

[t l-spine l5-s1 spot]
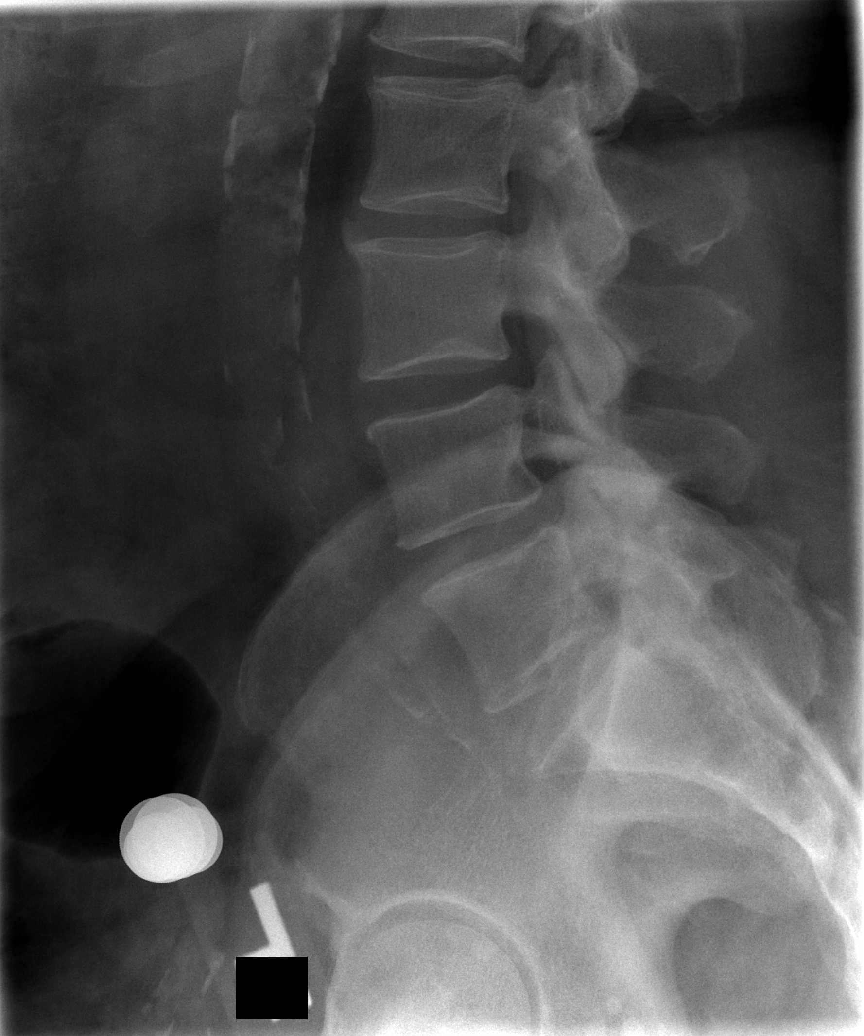

[6 of 6 positions shown; findings below may reference images not displayed]

FINDINGS: The lumbar vertebrae are in normal alignment with normal
intervertebral disc spaces.  No compression deformity is seen.  No
significant degenerative changes noted.  The SI joints appear
normal.
IMPRESSION: Normal alignment with normal disc spaces.

## 2009-12-13 ENCOUNTER — Encounter: Admission: RE | Admit: 2009-12-13 | Discharge: 2009-12-13 | Payer: Self-pay | Admitting: Nephrology

## 2009-12-13 IMAGING — CR DG CHEST 2V
2 series · 2 of 2 positions shown · non-contrast
Comparison: Chest x-ray of [DATE]

CLINICAL DATA: Cough, hypertension

CHEST - 2 VIEW

[view not recorded (1 of 2)]
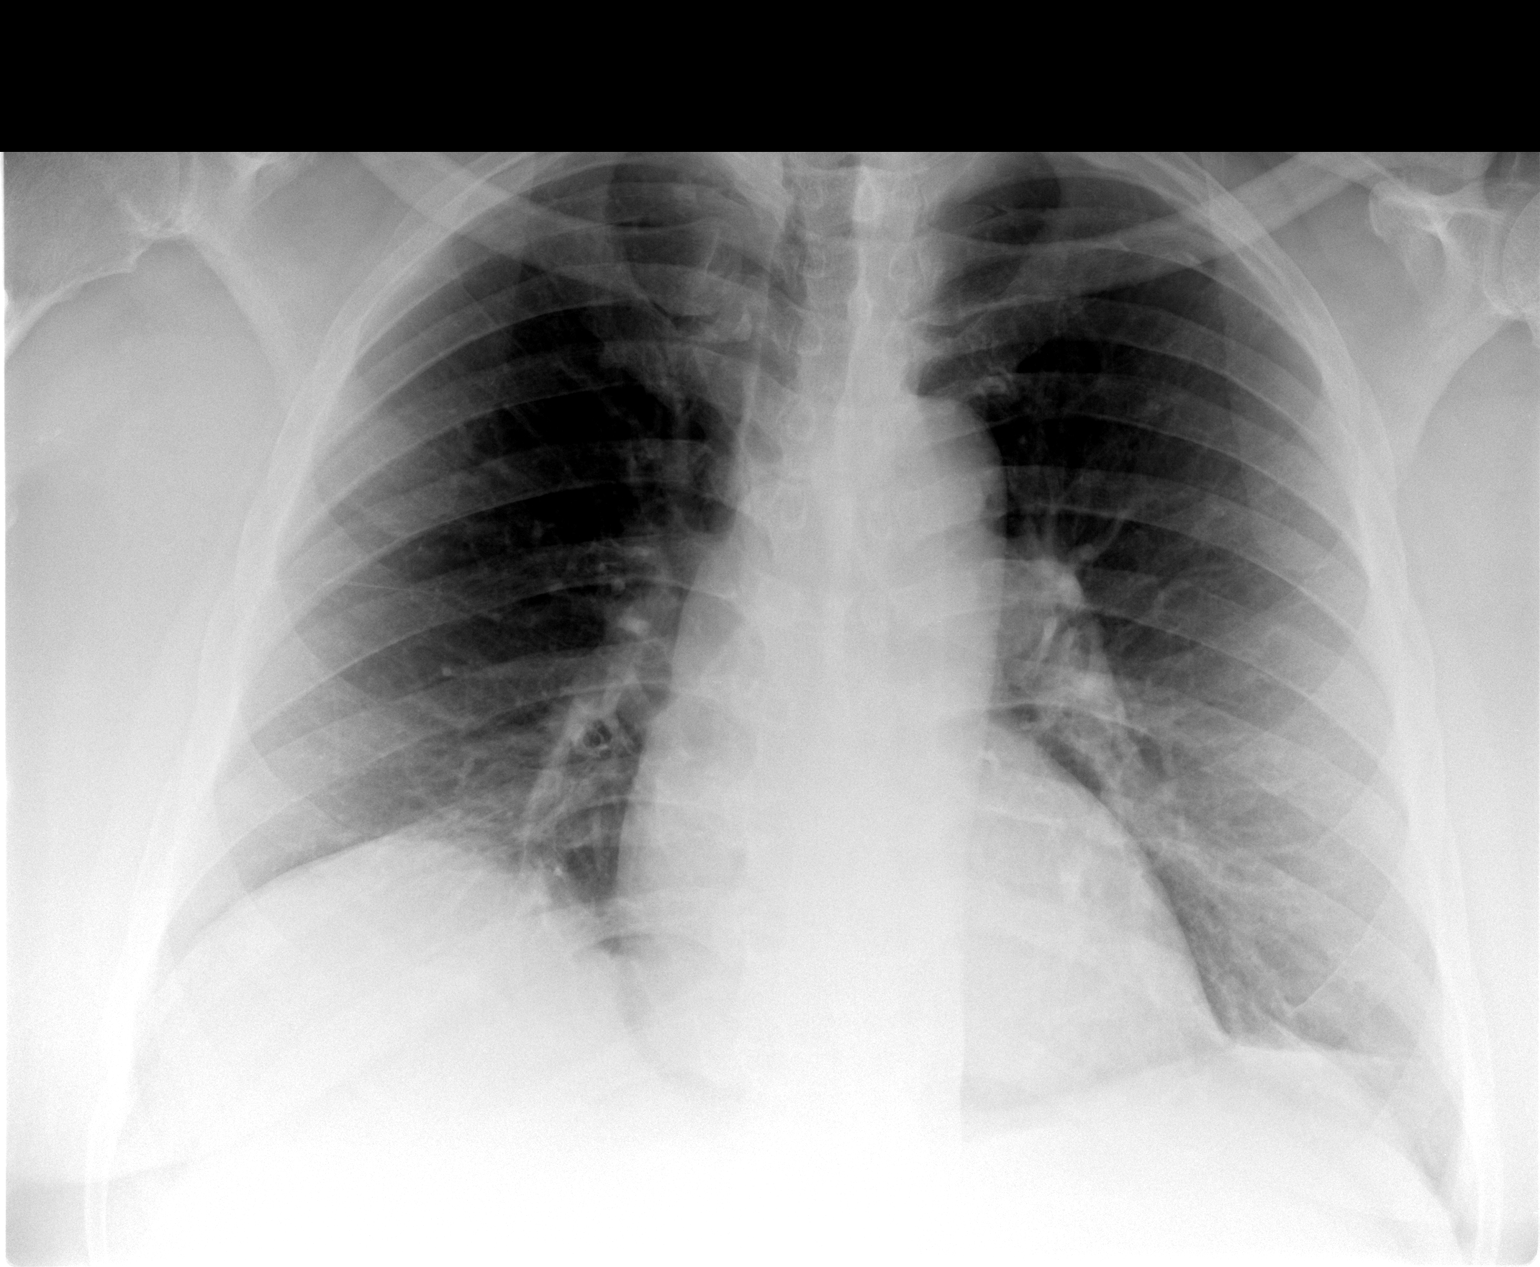

[view not recorded (2 of 2)]
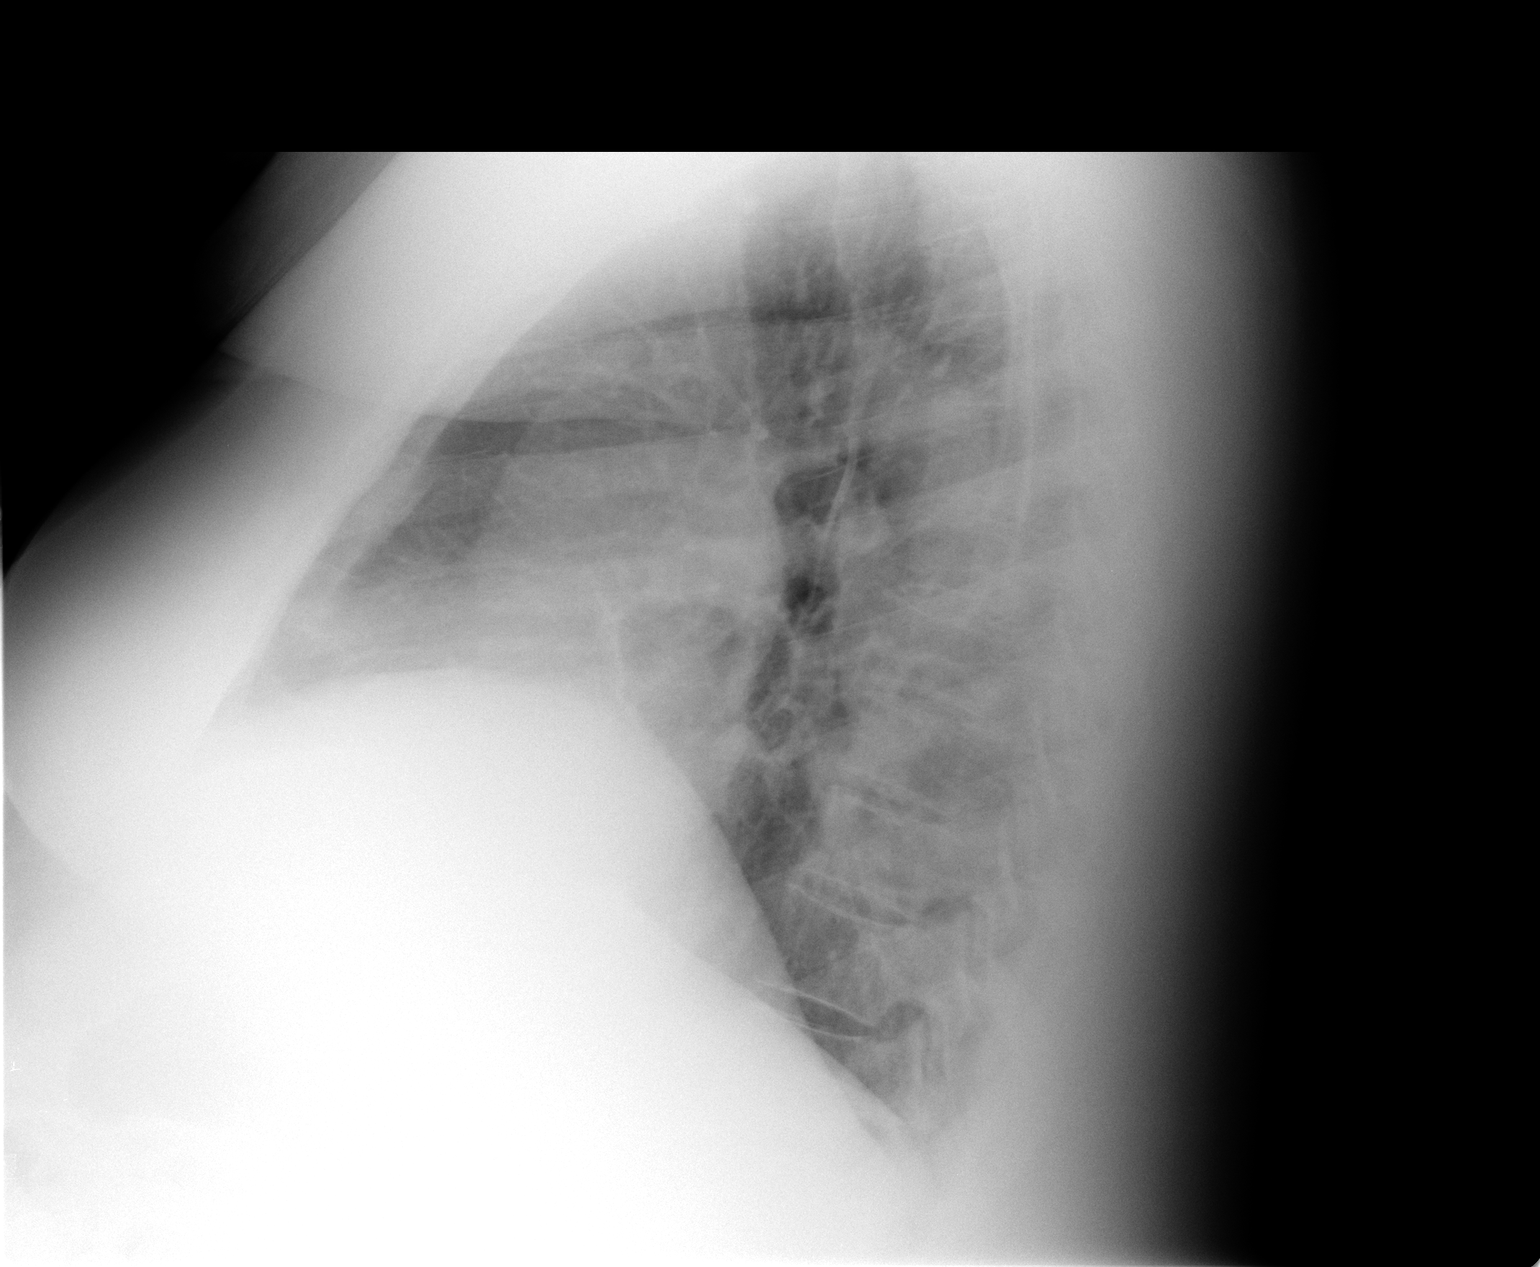

[2 of 2 positions shown; findings below may reference images not displayed]

FINDINGS: The lungs are clear.  Mediastinal contours are normal.
The heart is within upper limits of normal.  There are degenerative
changes in the thoracic spine.
IMPRESSION: No active lung disease.

## 2010-09-15 NOTE — Discharge Summary (Signed)
[facility]  Patient:    Nathan Proctor, Nathan Proctor                   MRN: [id number] Adm. Date:  [id number] Disc. Date: 09/27/00 Attending:  Lindaann Slough CC:         Jarome Matin, M.D.   Discharge Summary  DISCHARGE DIAGNOSES: 1. Erectile dysfunction. 2. Carcinoma of prostate. 3. Status post radical prostatectomy. 4. Diabetes. 5. Hypertension. 6. Hypercholesterolemia.  HISTORY OF PRESENT ILLNESS:  The patient is a 70 year old male who had a radical prostatectomy in 1997, for carcinoma of the prostate.  He has been complaining of difficulty having erections since.  He was treated with Viagra and PGE-1 without any success.  The patient wanted to have a semirigid prosthesis.  He had the procedure done on Sep 26, 2000, and was admitted for observation after the procedure.  PHYSICAL EXAMINATION:  VITAL SIGNS:  Blood pressure 128/80, pulse 104, respiratory rate 16, temperature 97.3.  HEENT:  Head is normal.  Pupils are equal, round and reactive to light.  Ears, nose and throat within normal limits.  LUNGS:  Clear.  HEART:  Regular rate and rhythm.  ABDOMEN:  Obese and nontender.  Liver, spleen and kidneys nonpalpable. Bladder nondistended.  Bowel sounds normal.  GENITALIA:  Penis is circumcised.  Meatus is normal.  Scrotum, testicles and epididymis are within normal limits.  LABORATORY DATA AND X-RAY FINDINGS:  His hemoglobin on admission was 11.8, hematocrit 35.8 and WBC 5.2.  Sodium 141, potassium 4.5, chloride 105, BUN 27, creatinine 1.8.  Urinalysis showed no rbcs or wbcs.  Chest x-ray showed no evidence of active disease.  EKG showed ST abnormality.  The patient had insertion of semirigid prosthesis done on Sep 26, 2000.  His postop course was uneventful.  He tolerated his diet well.  He voided well and his urine was clear.  His wound was clean.  He had minimal penile swelling. He was then discharged home on Sep 27, 2000.  DISCHARGE MEDICATIONS: 1. Lipitor 20 mg p.o. daily. 2. Actos 45 mg p.o. daily. 3. Tarka 4/240 p.o. daily. 4. Triamterene/HCTZ 37.5 p.o. daily. 5. Keflex 250 mg p.o. three times a day. 6. Percocet one or two tablets p.o. q.4h. p.r.n. 7. Insulin Humulin 70/30 55 units in a.m.  ACTIVITY:  The patient is instructed not to do any lifting, straining or driving until visit to my office.  DIET:  1800 calorie ADA diet.  CONDITION ON DISCHARGE:  Improved.  FOLLOWUP:  The patient will follow up with Dr. Brunilda Payor in two weeks. DD:  09/27/00 TD:  09/27/00 Job: [id number]

## 2010-09-15 NOTE — Discharge Summary (Signed)
NAME:  Nathan Proctor, Nathan Proctor            ACCOUNT NO.:  [id number]   MEDICAL RECORD NO.:  [id number]          PATIENT TYPE:  INP   LOCATION:  5743                         FACILITY:  [facility]   PHYSICIAN:  Jarome Matin, M.D.DATE OF BIRTH:  [birth year]   DATE OF ADMISSION:  04/03/2004  DATE OF DISCHARGE:  04/07/2004                                 DISCHARGE SUMMARY   ADMITTING DIAGNOSES:  1.  Shortness of breath.  2.  Congestive heart failure.   The patient also was morbidly obese.  In the office, he was very short of  breath and complaining of pain in his left knee.  The patient is a diabetic,  and he sometimes does not watch his health or take his medicines as he  should.  His wife, who is a professor at [employer], he lives with, and he had his  own [employer] streamer.  He has not been able to work too much of late.  He has arthritis and a history of gout.  His left knee is swollen.  He was  alert and oriented.  His chest had some bilateral wheezes, and a regular  sinus rhythm.  As reported before, he was morbidly obese, with active bowel  sounds.  No masses.  Male genitalia, with two descended testicles.  Could  move all his extremities.  His left knee was swollen.  He has evidence of  gout and also congestive heart failure.  We put him to bed rest and gave him  some Lasix.  X-rays of the left knee, and we put him on some of his home  medications, Lipitor 10 mg; 45 units of Humulin 70/30 in the morning, 10  units before supper; allopurinol 100 mg daily; and for his arthritis  Clinoril 200 mg b.i.d.; and clonidine 0.1 mg t.i.d. for his blood pressure.  Urinalysis and a urine culture and sensitivity, and he was also started on  Actos 45 mg daily for his sugar, along with his insulin.  The patient was  ultimately started on BiDil b.i.d. and Vytorin 10/20 daily to replace his  Zocor.  According to __________ endocrinology, we decided to stop the Actos  and put the patient on Avandia.  Actos may  cause more edema of the legs than  Avandia.  His temperature was 98.2; pulse 74; blood pressure is down to  98/48, and it was 169/81 on admission.  Gradually, the patient began to feel  better.  His blood pressure came back up.  His weight was 389.  Gradually,  his edema and swelling began to subside.  We continued him on Catapres 0.1  mg b.i.d.; Clinoril 200 mg b.i.d.; and BiDil one p.o. t.i.d.; Avandia 2 mg  p.o. b.i.d.; and also the allopurinol, kept him on that;  on that regimen we  decided to discharge him at home, and we would see him in the office in  about two weeks.      CEF/MEDQ  D:  08/02/2004  T:  08/02/2004  Job:  [id number]

## 2011-01-25 LAB — CBC
MCHC: 33.7
MCV: 93.8
RDW: 15.4

## 2011-01-25 LAB — DIFFERENTIAL
Basophils Absolute: 0
Basophils Relative: 0
Eosinophils Absolute: 0.2
Monocytes Absolute: 1.4 — ABNORMAL HIGH
Neutro Abs: 10.1 — ABNORMAL HIGH
Neutrophils Relative %: 79 — ABNORMAL HIGH

## 2011-01-25 LAB — BASIC METABOLIC PANEL
CO2: 29
Calcium: 9.6
Chloride: 102
Creatinine, Ser: 1.55 — ABNORMAL HIGH
Glucose, Bld: 205 — ABNORMAL HIGH

## 2011-07-03 ENCOUNTER — Other Ambulatory Visit: Payer: Self-pay | Admitting: Nephrology

## 2011-07-03 ENCOUNTER — Ambulatory Visit
Admission: RE | Admit: 2011-07-03 | Discharge: 2011-07-03 | Disposition: A | Discharge status: Home / Self Care | Payer: Medicare Other | Source: Ambulatory Visit | Attending: Nephrology | Admitting: Nephrology

## 2011-07-03 DIAGNOSIS — R52 Pain, unspecified: Secondary | ICD-10-CM

## 2011-07-03 IMAGING — CR DG FINGER INDEX 2+V*R*
3 series · 3 of 3 positions shown · non-contrast
Comparison: Prior hand films [DATE].

CLINICAL DATA: Injured finger.

RIGHT INDEX FINGER 2+V

[x finger pa right]
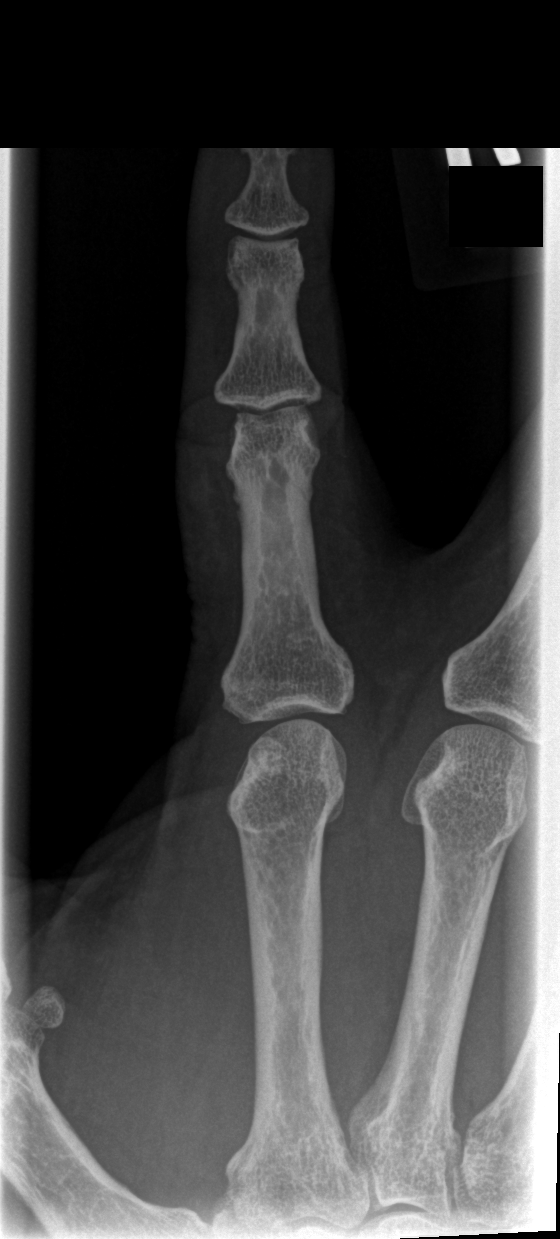

[x finger obl. right]
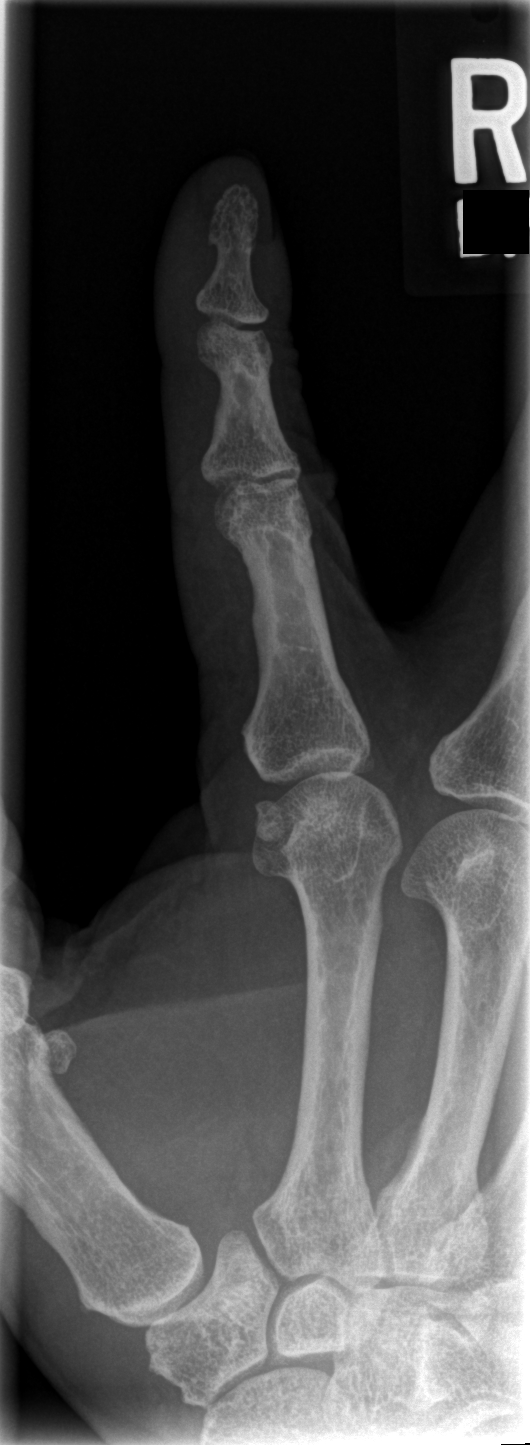

[x finger lateral right]
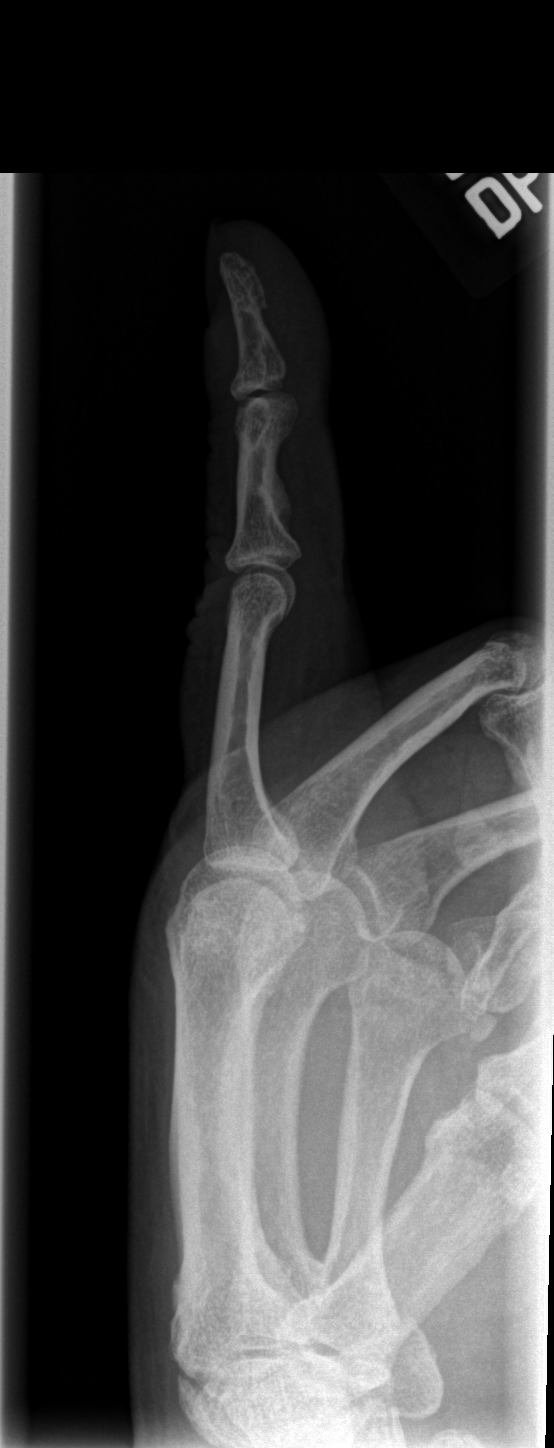

[3 of 3 positions shown; findings below may reference images not displayed]

FINDINGS: The joint spaces are maintained.  No acute fracture.  No
radiopaque foreign body.
IMPRESSION: No acute fracture or radiopaque foreign body.

## 2011-08-29 ENCOUNTER — Ambulatory Visit
Admission: RE | Admit: 2011-08-29 | Discharge: 2011-08-29 | Disposition: A | Discharge status: Home / Self Care | Payer: Medicare Other | Source: Ambulatory Visit | Attending: Nephrology | Admitting: Nephrology

## 2011-08-29 ENCOUNTER — Other Ambulatory Visit: Payer: Self-pay | Admitting: Nephrology

## 2011-08-29 DIAGNOSIS — R059 Cough, unspecified: Secondary | ICD-10-CM

## 2011-08-29 DIAGNOSIS — R05 Cough: Secondary | ICD-10-CM

## 2011-08-29 IMAGING — CR DG CHEST 2V
2 series · 2 of 2 positions shown · non-contrast
Comparison: Chest x-ray of [DATE]

CLINICAL DATA: Cough, congestion

CHEST - 2 VIEW

[w chest pa]
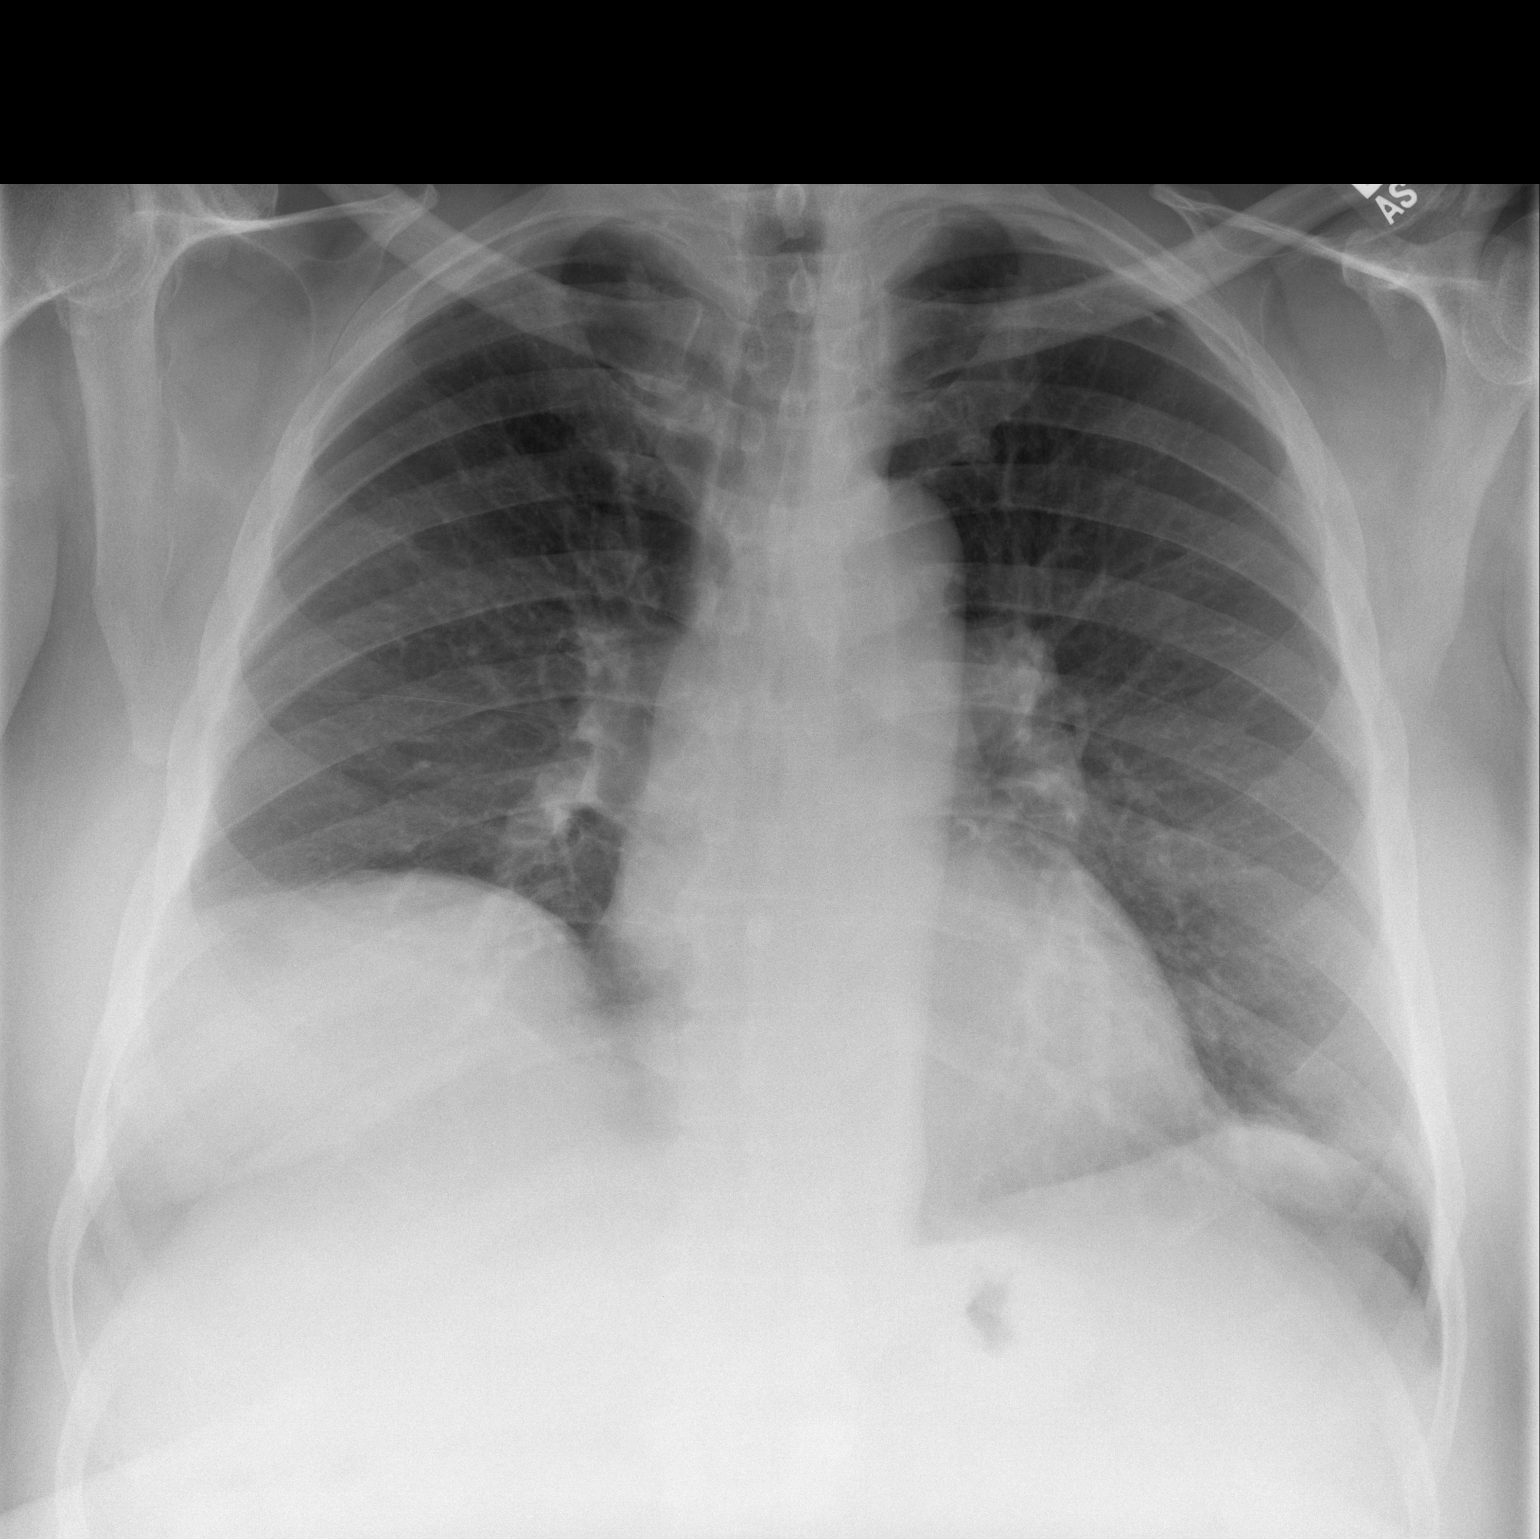

[w chest lat *]
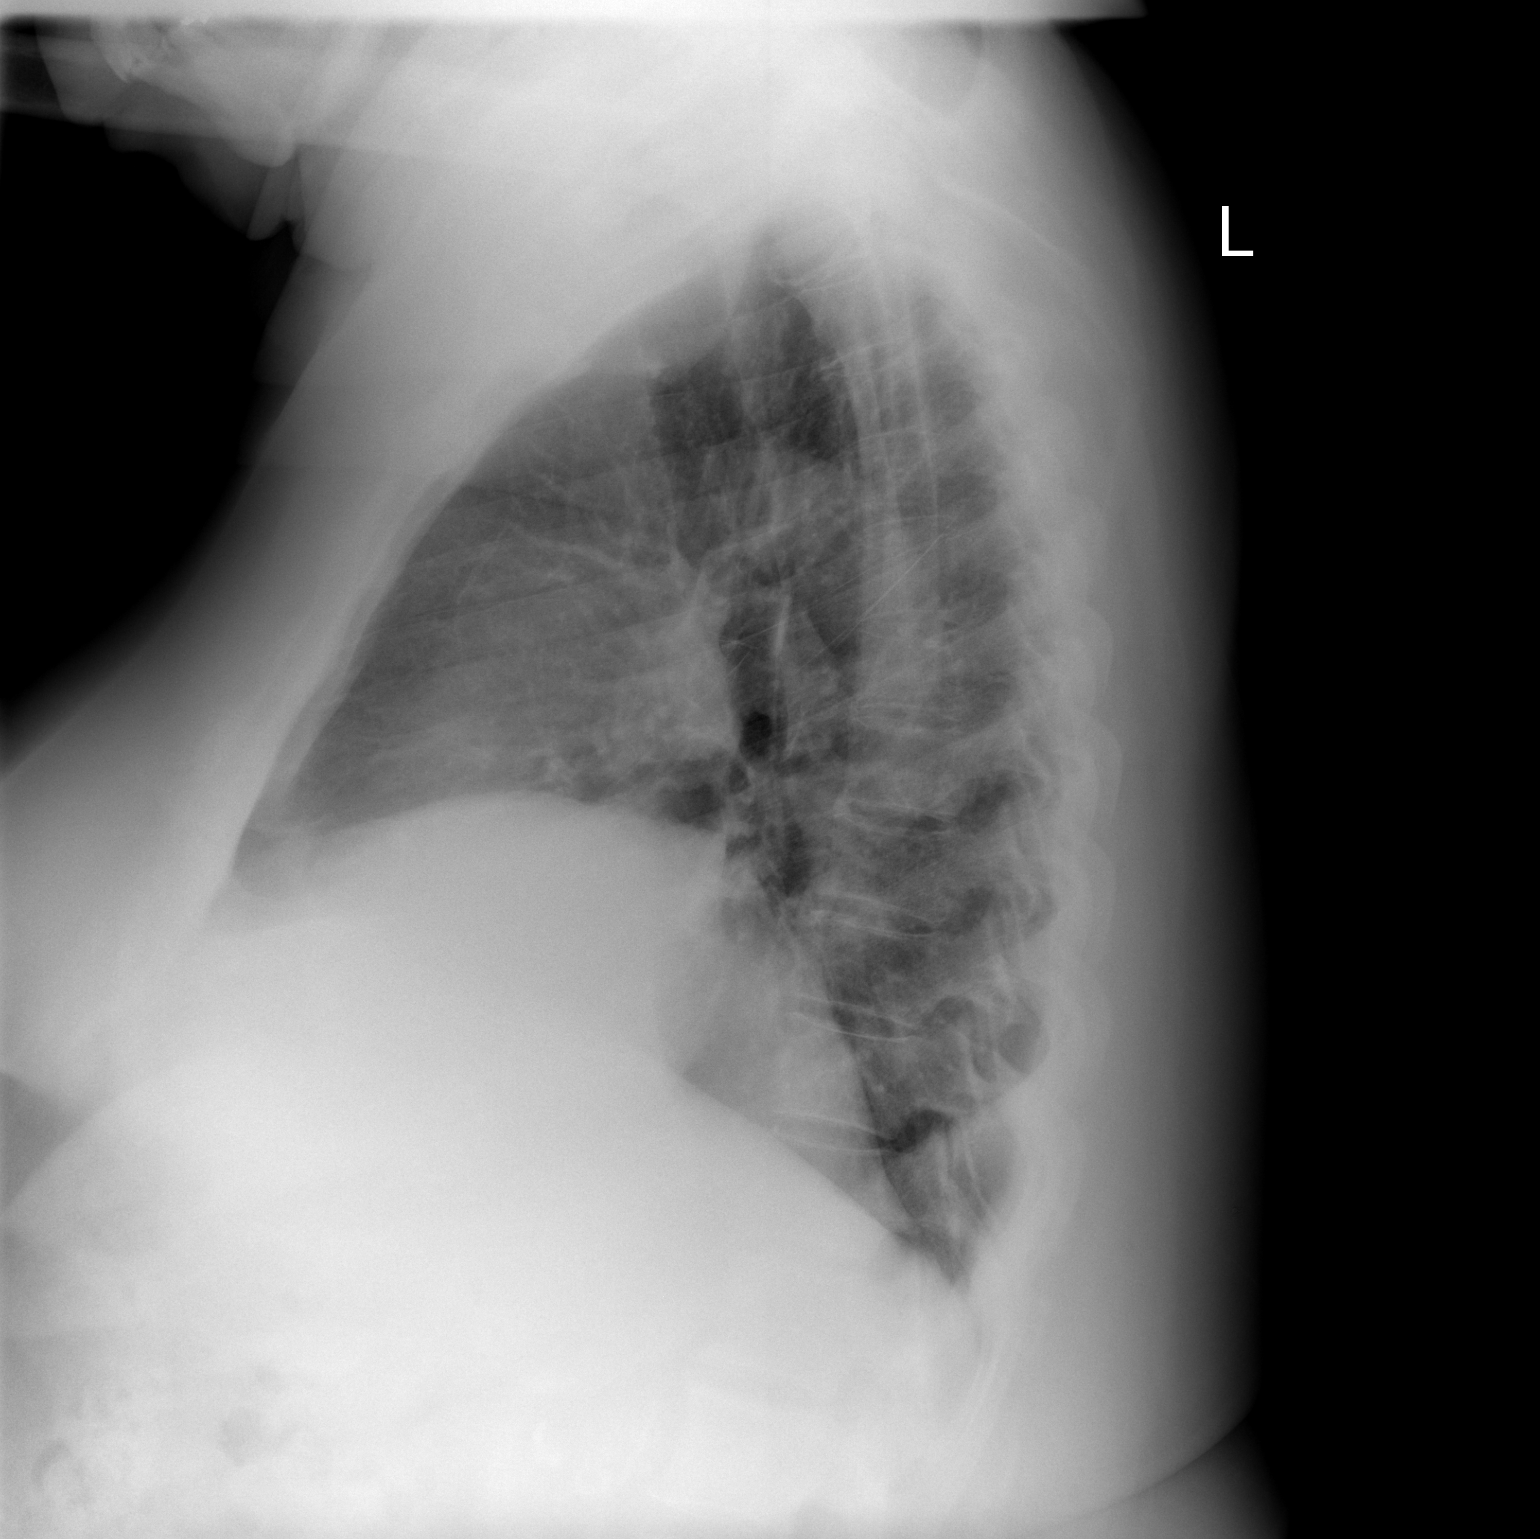

[2 of 2 positions shown; findings below may reference images not displayed]

FINDINGS: The lungs remain clear.  There is chronic elevation of
the right hemidiaphragm.  Mediastinal contours are stable.  The
heart is mildly enlarged and stable.  No bony abnormality is seen.
IMPRESSION: No active lung disease.  Stable mild cardiomegaly and chronic
elevation of the right hemidiaphragm.

## 2015-04-11 ENCOUNTER — Other Ambulatory Visit: Payer: Self-pay | Admitting: Internal Medicine

## 2015-04-11 DIAGNOSIS — R109 Unspecified abdominal pain: Secondary | ICD-10-CM

## 2015-04-11 DIAGNOSIS — R1084 Generalized abdominal pain: Secondary | ICD-10-CM

## 2015-04-18 ENCOUNTER — Ambulatory Visit (HOSPITAL_COMMUNITY): Payer: PPO

## 2015-04-21 ENCOUNTER — Ambulatory Visit (HOSPITAL_COMMUNITY): Payer: PPO

## 2015-05-19 ENCOUNTER — Other Ambulatory Visit: Payer: Self-pay | Admitting: Internal Medicine

## 2015-05-19 DIAGNOSIS — R109 Unspecified abdominal pain: Secondary | ICD-10-CM

## 2015-05-23 ENCOUNTER — Ambulatory Visit (HOSPITAL_COMMUNITY)
Admission: RE | Admit: 2015-05-23 | Discharge: 2015-05-23 | Disposition: A | Discharge status: Home / Self Care | Payer: Medicare HMO | Source: Ambulatory Visit | Attending: Internal Medicine | Admitting: Internal Medicine

## 2015-05-23 DIAGNOSIS — N281 Cyst of kidney, acquired: Secondary | ICD-10-CM | POA: Insufficient documentation

## 2015-05-23 DIAGNOSIS — R1011 Right upper quadrant pain: Secondary | ICD-10-CM | POA: Insufficient documentation

## 2015-05-23 DIAGNOSIS — R109 Unspecified abdominal pain: Secondary | ICD-10-CM

## 2015-07-13 IMAGING — US US ABDOMEN COMPLETE
1 series · 14 of 25 positions shown · non-contrast
Comparison: None.

CLINICAL DATA: Right upper quadrant abdominal pain for 4 months.

EXAM:
ABDOMEN ULTRASOUND COMPLETE

[Series 1: us abdomen complete · 0.27mm/px · 14 of 72 slices shown]
[im 1/72]
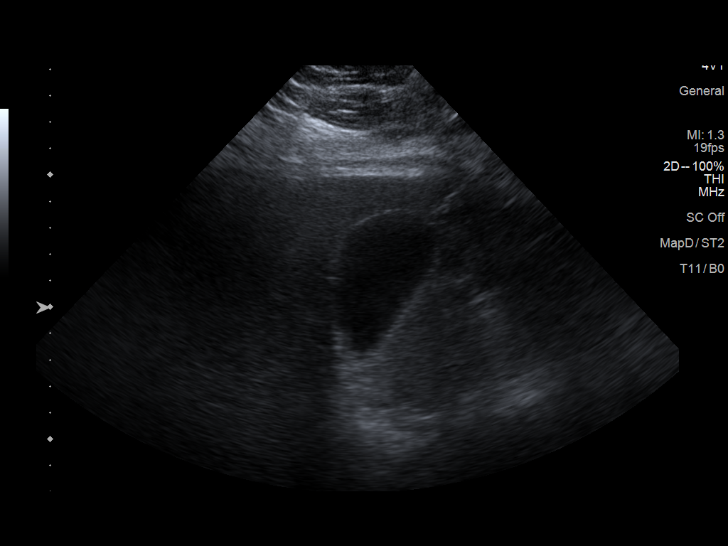
[im 6/72]
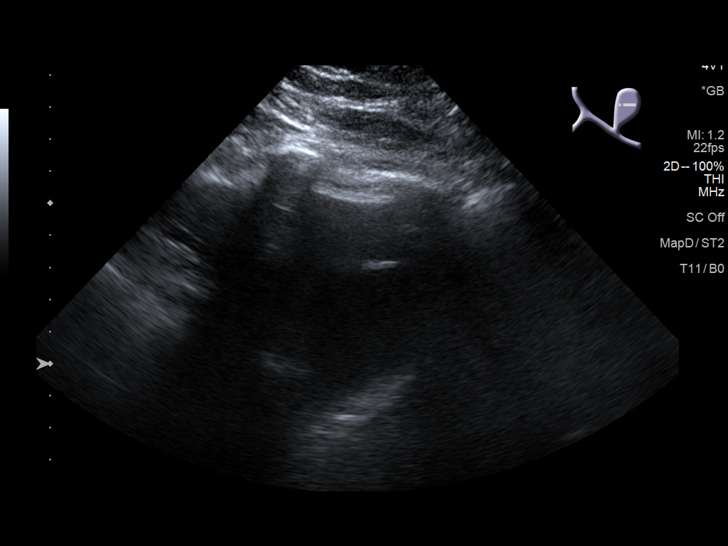
[im 12/72]
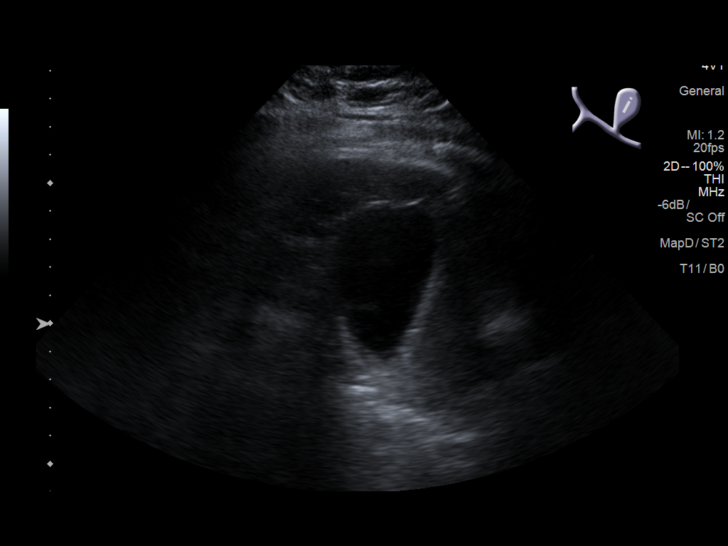
[im 18/72]
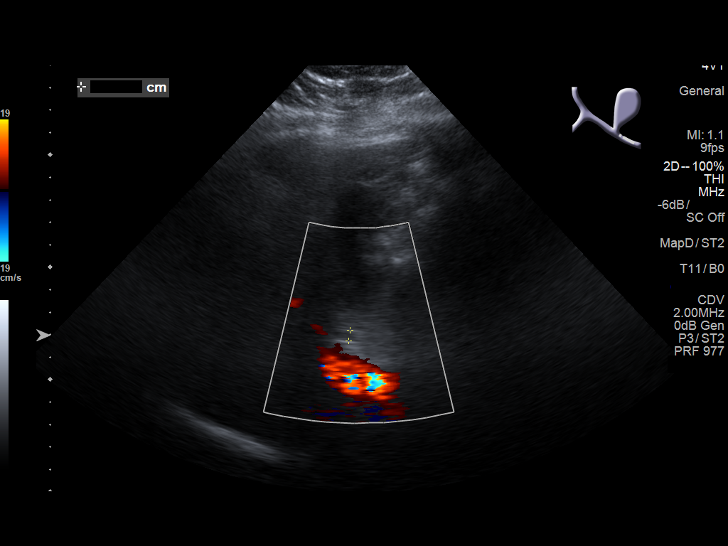
[im 24/72]
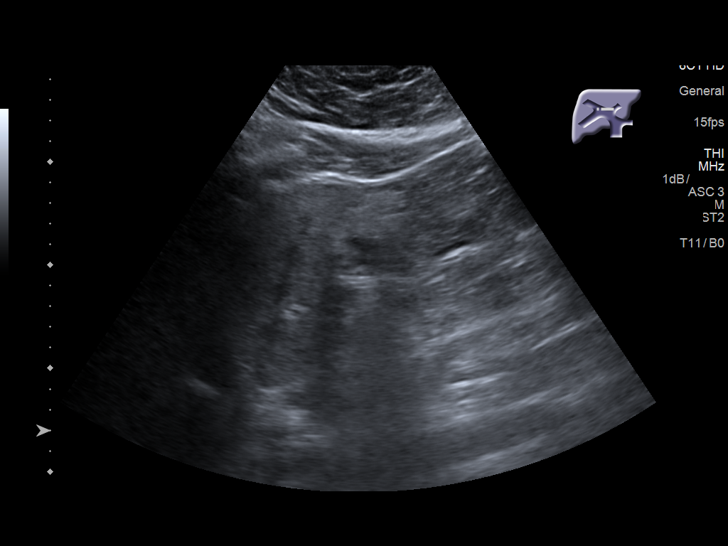
[im 27/72]
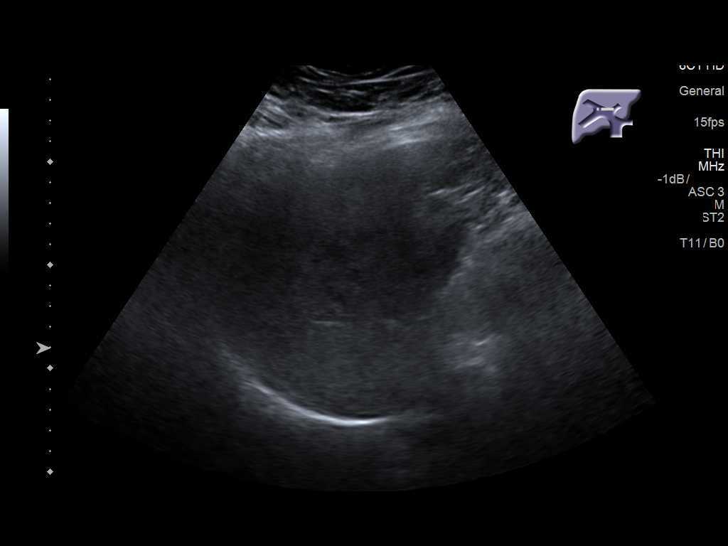
[im 33/72]
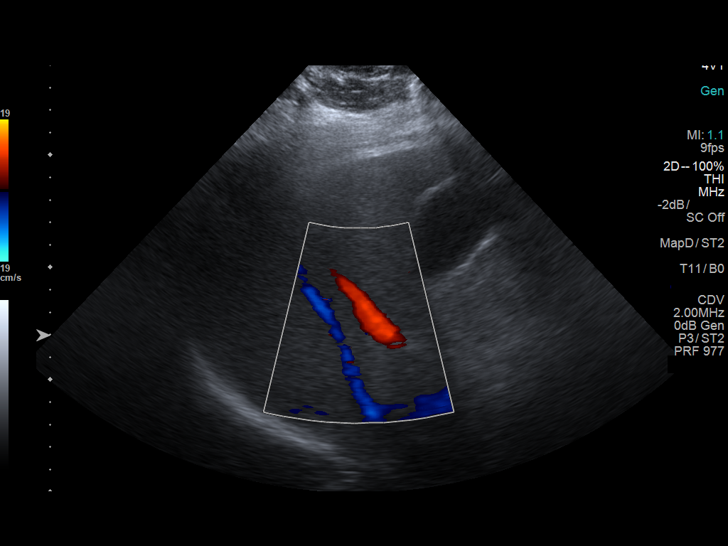
[im 39/72]
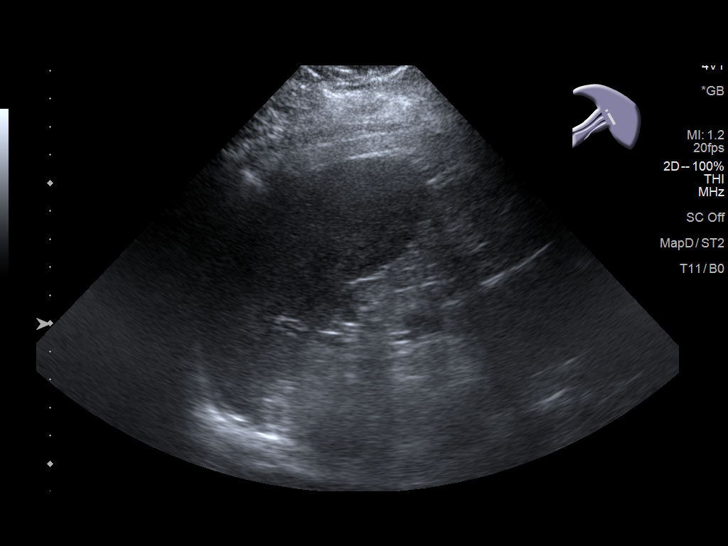
[im 45/72]
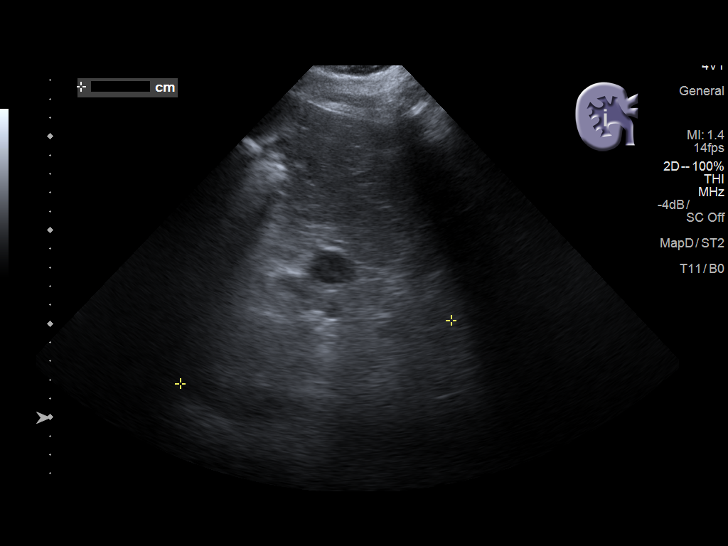
[im 48/72]
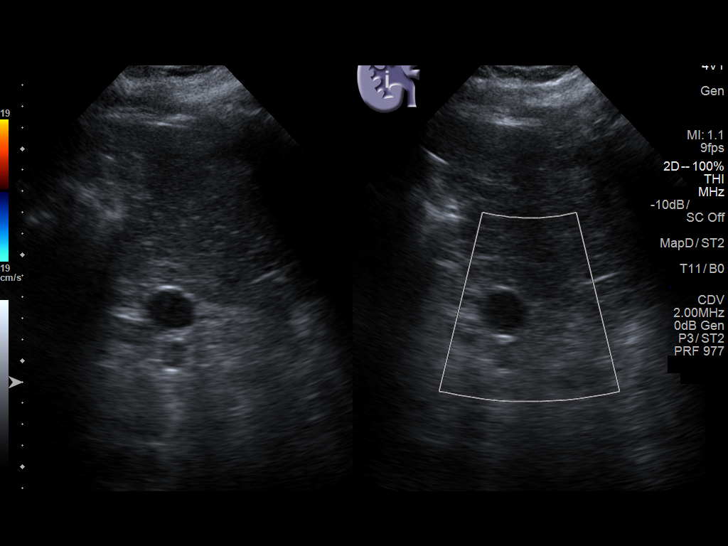
[im 54/72]
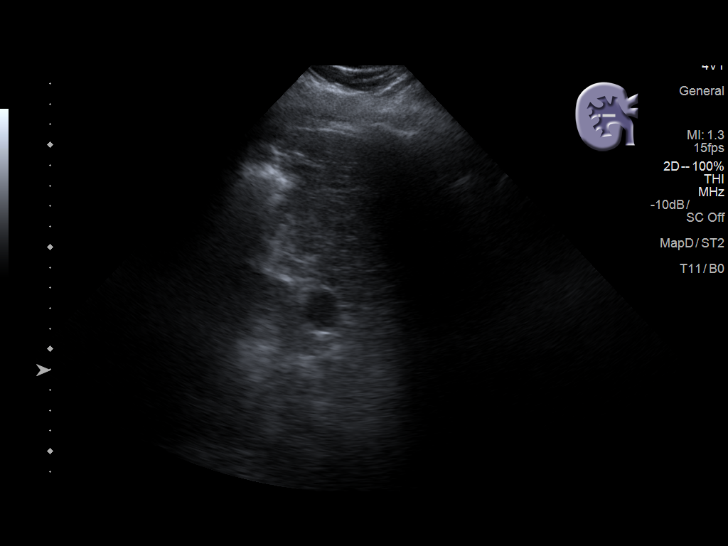
[im 60/72]
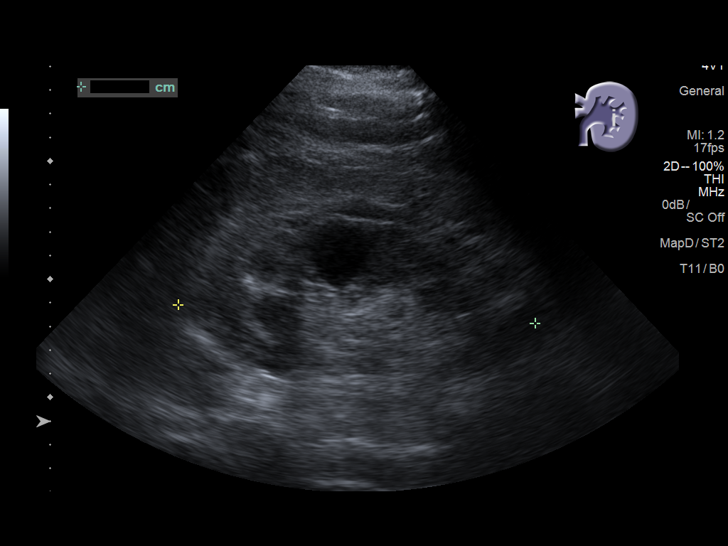
[im 66/72]
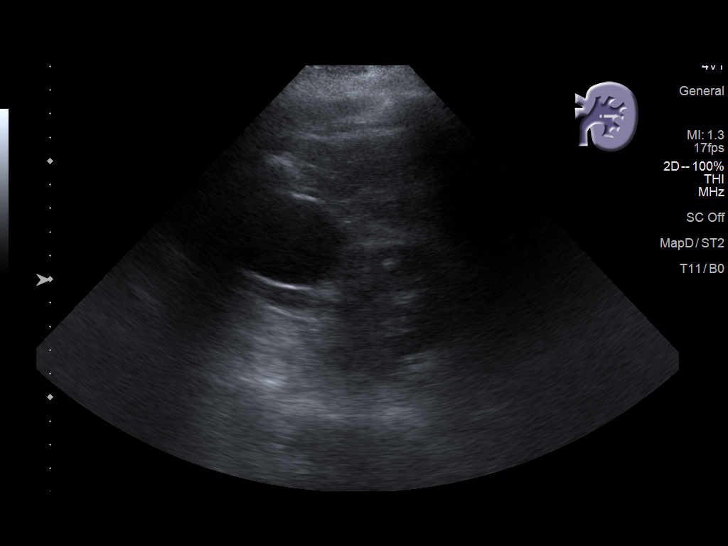
[im 72/72]
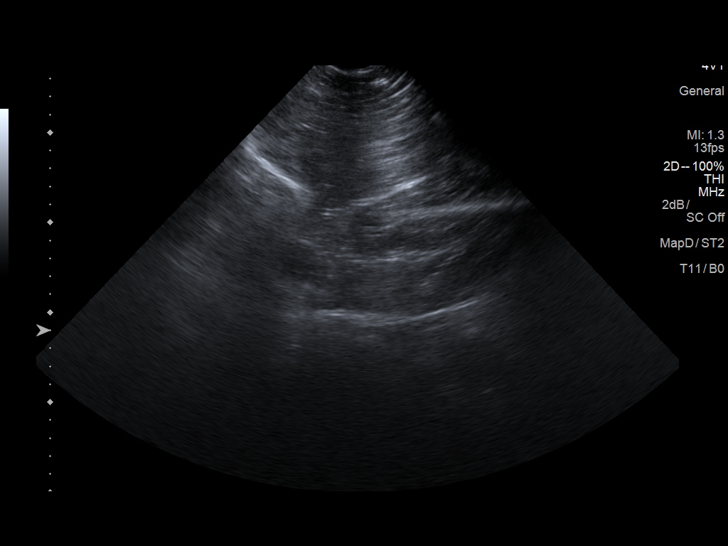

[14 of 25 positions shown; findings below may reference images not displayed]

FINDINGS: Gallbladder: No gallstones or wall thickening visualized. No
sonographic Murphy sign noted by sonographer.

Common bile duct: Diameter: 5 mm, within normal limits.

Liver: Suboptimally evaluated due to body habitus.

IVC: Suboptimally evaluated due to body habitus.

Pancreas: Suboptimally evaluated due to body habitus.

Spleen: 6.4 cm, negative.

Right Kidney: Length: 14.9 cm. Parenchymal echogenicity is grossly
within normal limits. Anechoic lesions with increased through
transmission measure up to 2.6 x 2.0 x 2.4 cm. No hydronephrosis.

Left Kidney: Length: 15.1 cm. Parenchymal echogenicity is grossly
within normal limits. Anechoic lesions with increased through
transmission measure up to 8.2 x 5.5 x 9.5 cm. The largest lesion
appears bilobed. No hydronephrosis.

Abdominal aorta: Poorly visualized due to body habitus.

Other findings: None.
IMPRESSION: 1. Suboptimal evaluation of multiple structures due to body habitus.
2. Bilateral renal cysts.

## 2016-02-15 ENCOUNTER — Emergency Department (HOSPITAL_COMMUNITY)
Admission: EM | Admit: 2016-02-15 | Discharge: 2016-02-16 | Disposition: A | Discharge status: Home / Self Care | Payer: Medicare HMO | Source: Home / Self Care | Attending: Emergency Medicine | Admitting: Emergency Medicine

## 2016-02-15 ENCOUNTER — Emergency Department (HOSPITAL_COMMUNITY): Payer: Medicare HMO

## 2016-02-15 ENCOUNTER — Encounter (HOSPITAL_COMMUNITY): Payer: Self-pay | Admitting: Emergency Medicine

## 2016-02-15 DIAGNOSIS — S82862A Displaced Maisonneuve's fracture of left leg, initial encounter for closed fracture: Secondary | ICD-10-CM | POA: Insufficient documentation

## 2016-02-15 DIAGNOSIS — E119 Type 2 diabetes mellitus without complications: Secondary | ICD-10-CM | POA: Diagnosis not present

## 2016-02-15 DIAGNOSIS — W109XXA Fall (on) (from) unspecified stairs and steps, initial encounter: Secondary | ICD-10-CM

## 2016-02-15 DIAGNOSIS — Z7984 Long term (current) use of oral hypoglycemic drugs: Secondary | ICD-10-CM | POA: Insufficient documentation

## 2016-02-15 DIAGNOSIS — E11649 Type 2 diabetes mellitus with hypoglycemia without coma: Secondary | ICD-10-CM

## 2016-02-15 DIAGNOSIS — Z79899 Other long term (current) drug therapy: Secondary | ICD-10-CM | POA: Insufficient documentation

## 2016-02-15 DIAGNOSIS — Y9289 Other specified places as the place of occurrence of the external cause: Secondary | ICD-10-CM | POA: Insufficient documentation

## 2016-02-15 DIAGNOSIS — Y999 Unspecified external cause status: Secondary | ICD-10-CM

## 2016-02-15 DIAGNOSIS — E162 Hypoglycemia, unspecified: Secondary | ICD-10-CM

## 2016-02-15 DIAGNOSIS — M25572 Pain in left ankle and joints of left foot: Secondary | ICD-10-CM

## 2016-02-15 DIAGNOSIS — S82892D Other fracture of left lower leg, subsequent encounter for closed fracture with routine healing: Secondary | ICD-10-CM | POA: Diagnosis not present

## 2016-02-15 DIAGNOSIS — Y9389 Activity, other specified: Secondary | ICD-10-CM

## 2016-02-15 DIAGNOSIS — Z8546 Personal history of malignant neoplasm of prostate: Secondary | ICD-10-CM | POA: Insufficient documentation

## 2016-02-15 DIAGNOSIS — S82832A Other fracture of upper and lower end of left fibula, initial encounter for closed fracture: Secondary | ICD-10-CM

## 2016-02-15 DIAGNOSIS — S82899A Other fracture of unspecified lower leg, initial encounter for closed fracture: Secondary | ICD-10-CM

## 2016-02-15 DIAGNOSIS — W19XXXA Unspecified fall, initial encounter: Secondary | ICD-10-CM | POA: Diagnosis not present

## 2016-02-15 HISTORY — DX: Other fracture of unspecified lower leg, initial encounter for closed fracture: S82.899A

## 2016-02-15 HISTORY — DX: Malignant (primary) neoplasm, unspecified: C80.1

## 2016-02-15 HISTORY — DX: Type 2 diabetes mellitus without complications: E11.9

## 2016-02-15 LAB — CBC
HEMATOCRIT: 35.3 % — AB (ref 39.0–52.0)
Hemoglobin: 10.9 g/dL — ABNORMAL LOW (ref 13.0–17.0)
MCH: 29.9 pg (ref 26.0–34.0)
MCHC: 30.9 g/dL (ref 30.0–36.0)
MCV: 96.7 fL (ref 78.0–100.0)
PLATELETS: 233 10*3/uL (ref 150–400)
RBC: 3.65 MIL/uL — ABNORMAL LOW (ref 4.22–5.81)
RDW: 14.9 % (ref 11.5–15.5)
WBC: 12 10*3/uL — AB (ref 4.0–10.5)

## 2016-02-15 LAB — BASIC METABOLIC PANEL
Anion gap: 10 (ref 5–15)
BUN: 25 mg/dL — AB (ref 6–20)
CALCIUM: 10 mg/dL (ref 8.9–10.3)
CHLORIDE: 108 mmol/L (ref 101–111)
CO2: 26 mmol/L (ref 22–32)
CREATININE: 1.56 mg/dL — AB (ref 0.61–1.24)
GFR calc Af Amer: 48 mL/min — ABNORMAL LOW (ref >60)
GFR calc non Af Amer: 41 mL/min — ABNORMAL LOW (ref >60)
Glucose, Bld: 58 mg/dL — ABNORMAL LOW (ref 65–99)
Potassium: 4.2 mmol/L (ref 3.5–5.1)
SODIUM: 144 mmol/L (ref 135–145)

## 2016-02-15 LAB — CBG MONITORING, ED
GLUCOSE-CAPILLARY: 55 mg/dL — AB (ref 65–99)
GLUCOSE-CAPILLARY: 61 mg/dL — AB (ref 65–99)
Glucose-Capillary: 45 mg/dL — ABNORMAL LOW (ref 65–99)
Glucose-Capillary: 162 mg/dL — ABNORMAL HIGH (ref 65–99)
Glucose-Capillary: 189 mg/dL — ABNORMAL HIGH (ref 65–99)
Glucose-Capillary: 213 mg/dL — ABNORMAL HIGH (ref 65–99)

## 2016-02-15 IMAGING — CR DG ANKLE COMPLETE 3+V*L*
3 series · 3 of 3 positions shown · non-contrast
Comparison: None

CLINICAL DATA: Stepped out of his truck and rolled his ankle
earlier today, pain throughout ankle and proximal LEFT foot
radiating proximally throughout LEFT lower leg and LEFT knee,
initial encounter

EXAM:
LEFT ANKLE COMPLETE - 3+ VIEW

[x ankle lat left]
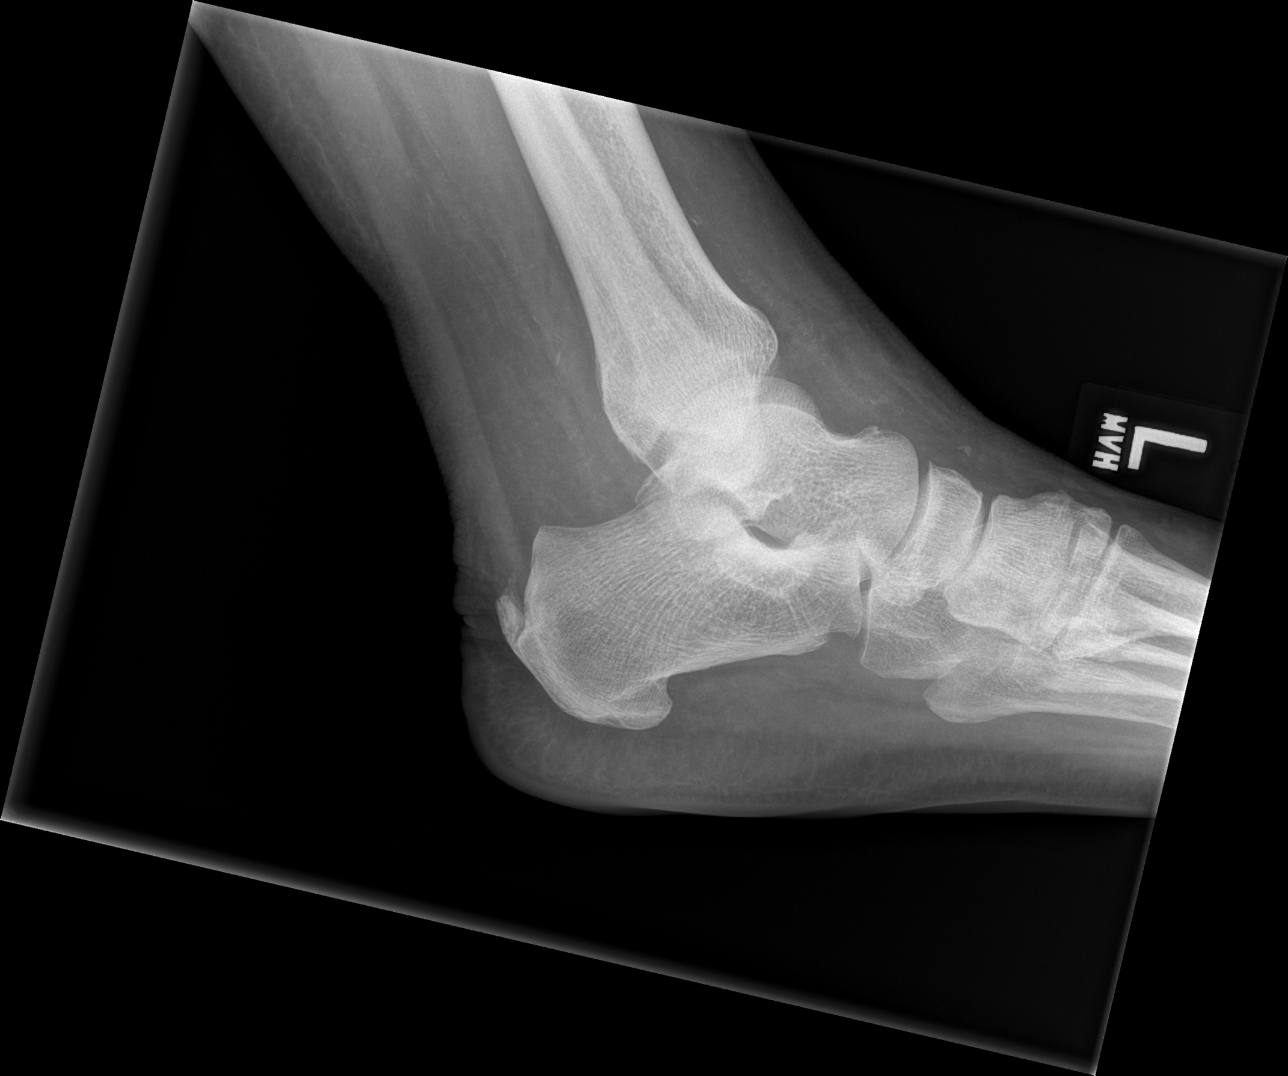

[x ankle ap left]
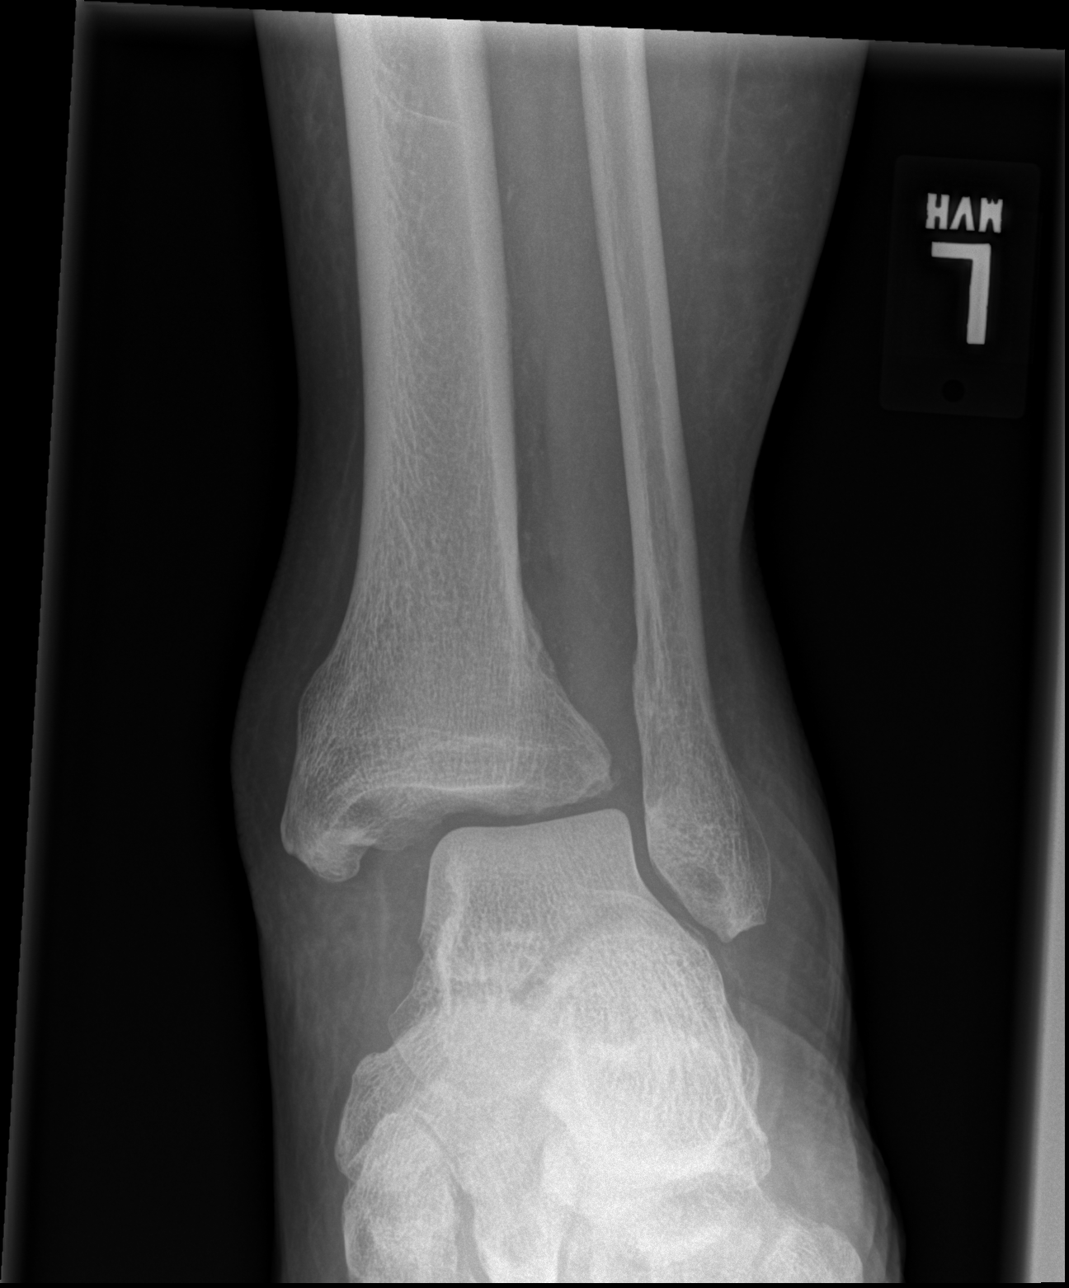

[x ankle obl left]
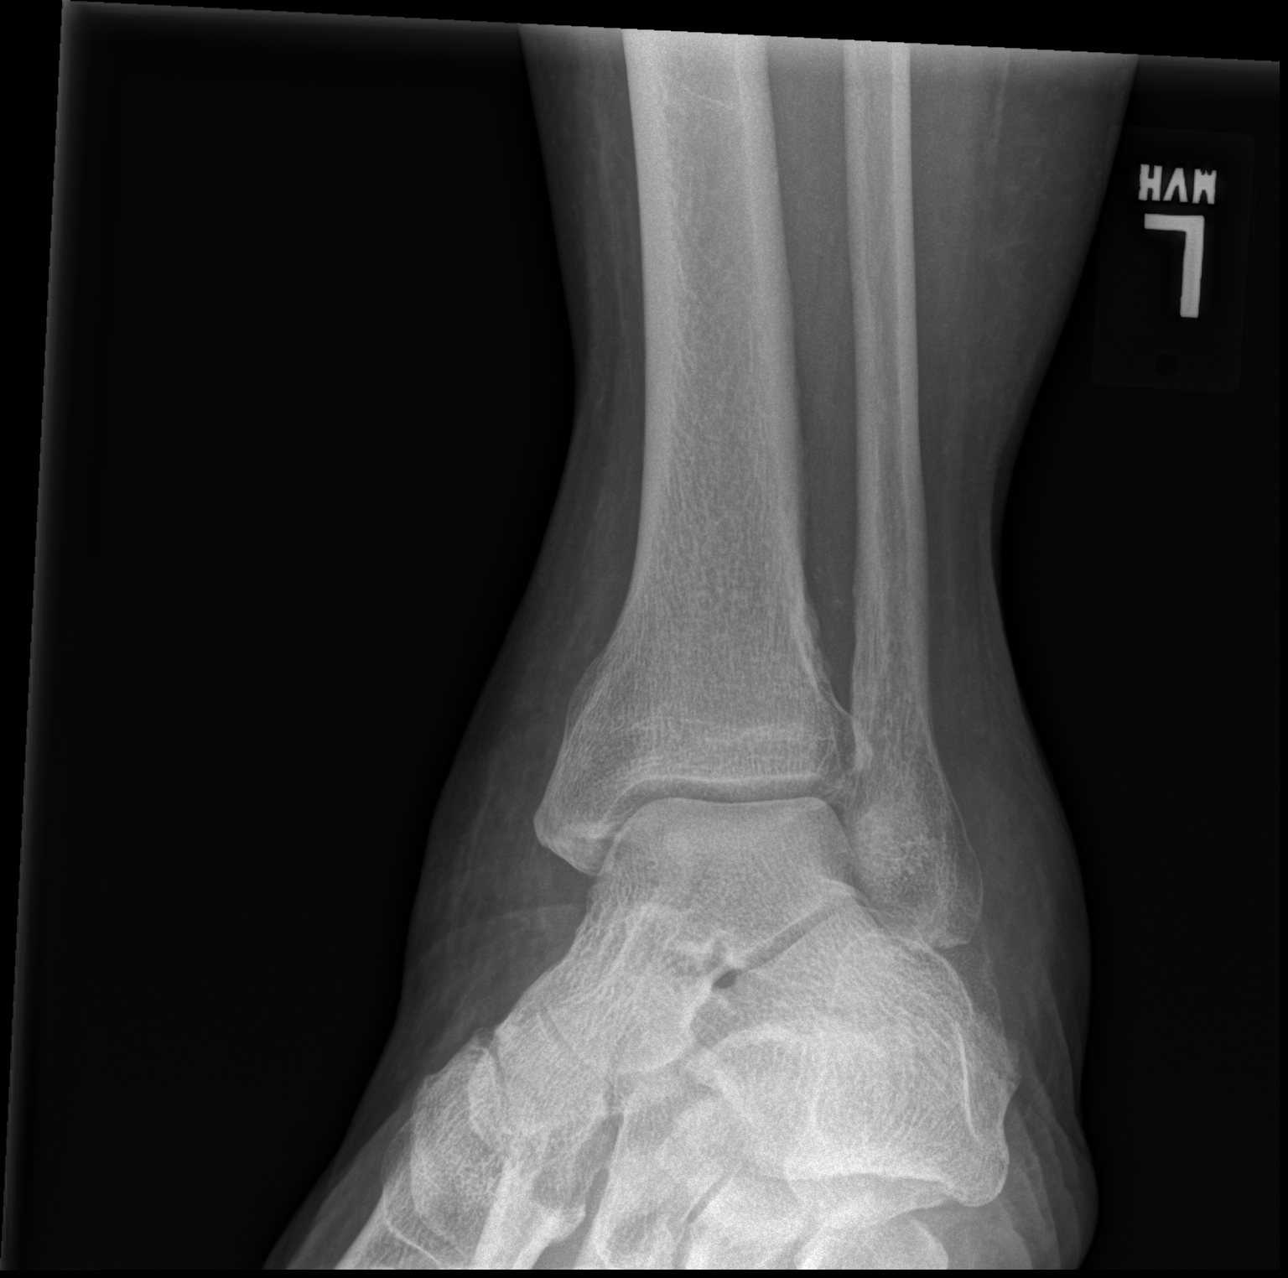

[3 of 3 positions shown; findings below may reference images not displayed]

FINDINGS: Marked widening of the medial joint line on AP view, appears reduced
on oblique view, consistent with ligamentous disruption.

Bones appear slightly demineralized.

No definite fracture or gross dislocation seen.

Large Achilles insertion calcaneal spur.
IMPRESSION: Marked widening of the medial ankle joint on AP view reduced on
oblique and lateral views, consistent with medial ligamentous injury
and subluxation.

No acute fracture or dislocation identified.

## 2016-02-15 IMAGING — CR DG TIBIA/FIBULA 2V*L*
4 series · 4 of 4 positions shown · non-contrast
Comparison: [DATE]

CLINICAL DATA: Stepped out of his truck and rolled his ankle
earlier today, pain throughout ankle and proximal LEFT foot
radiating proximally throughout LEFT lower leg and LEFT knee,
initial encounter

EXAM:
LEFT TIBIA AND FIBULA - 2 VIEW

[x tib-fib ap left (1 of 2)]
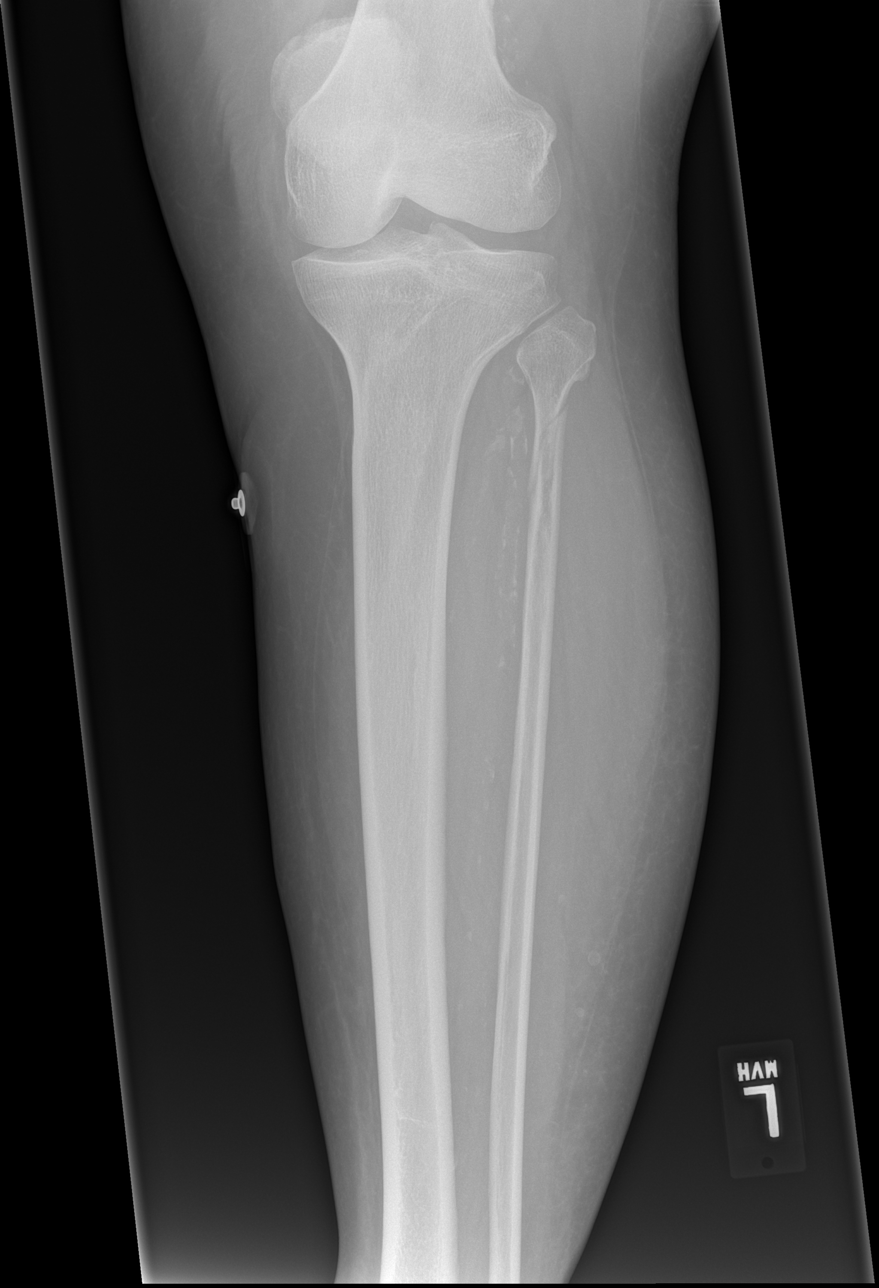

[x tib-fib ap left (2 of 2)]
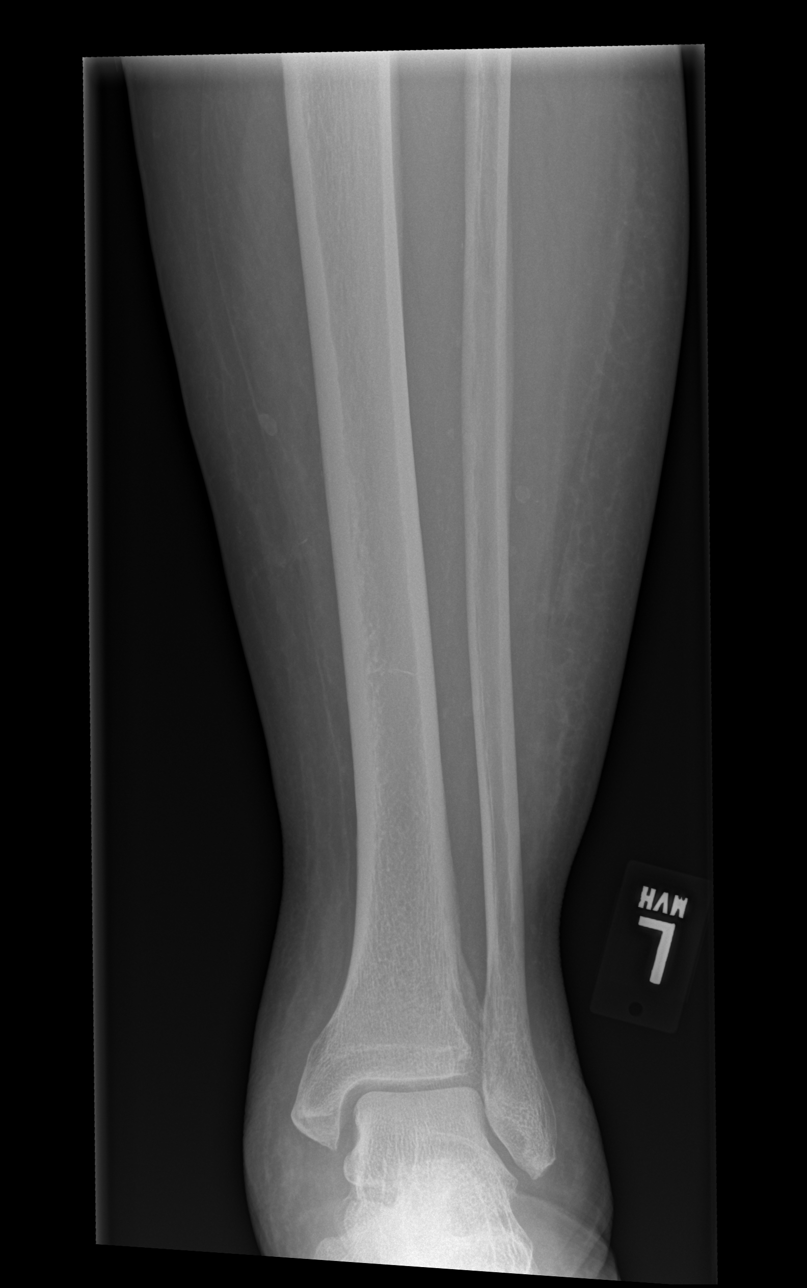

[x tib-fib lat left (1 of 2)]
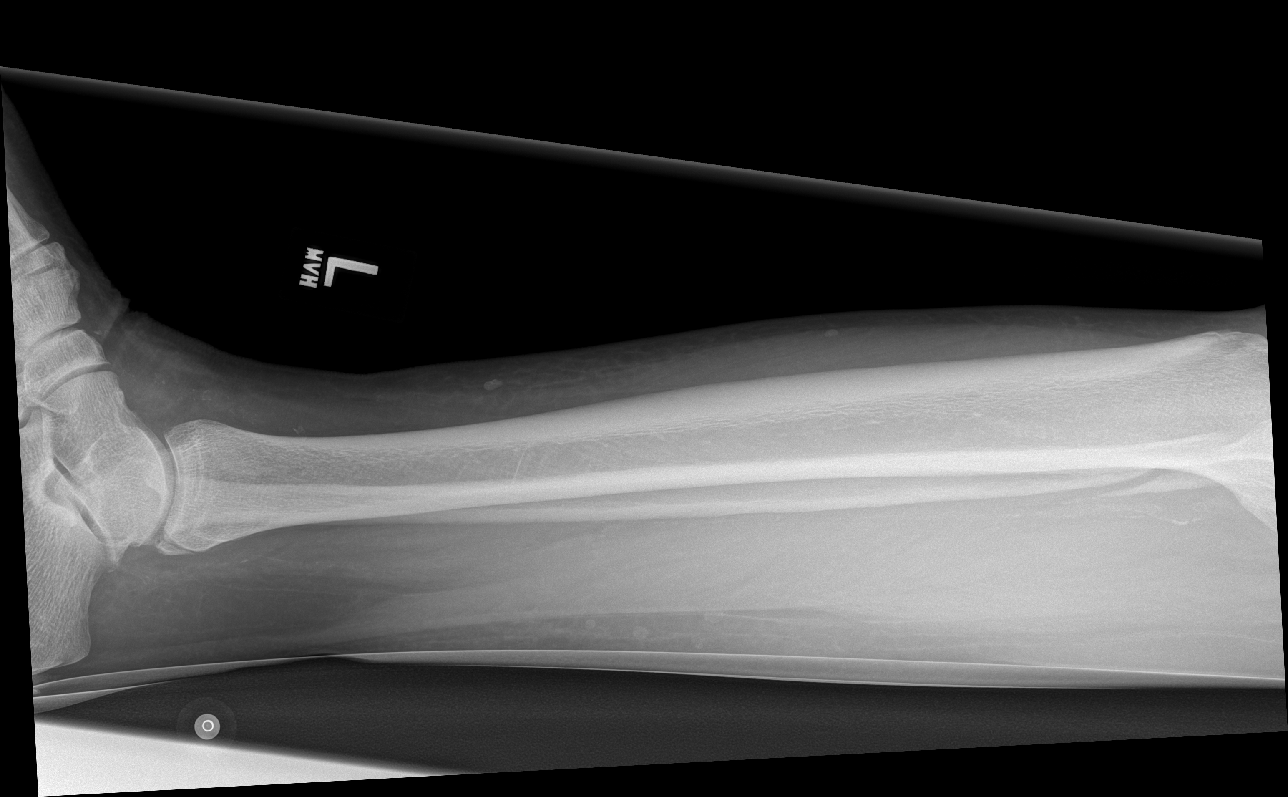

[x tib-fib lat left (2 of 2)]
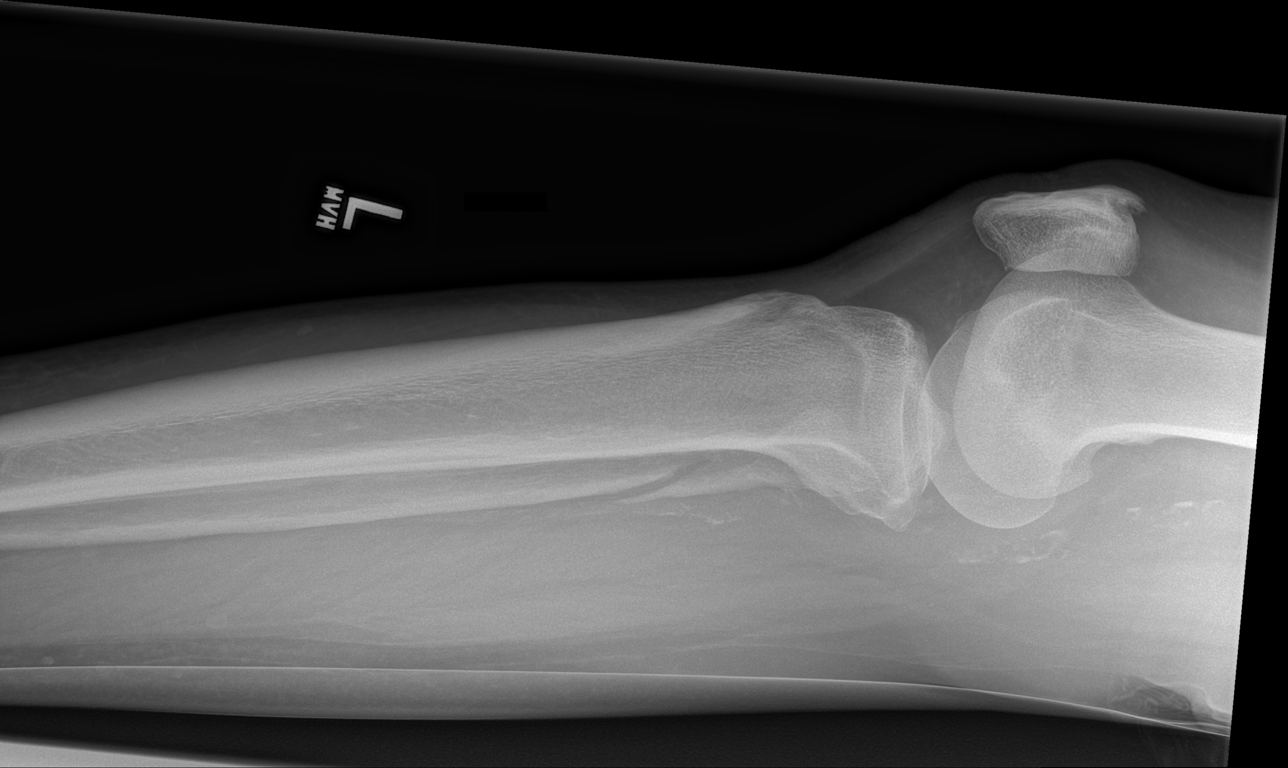

[4 of 4 positions shown; findings below may reference images not displayed]

FINDINGS: Osseous demineralization.

Joint spaces preserved.

Oblique nondisplaced fracture proximal fibular diaphysis.

No additional fracture, dislocation, or bone destruction.

Increased atherosclerotic calcifications since previous exam.

Soft tissue swelling at ankle.
IMPRESSION: Nondisplaced oblique proximal LEFT fibular diaphyseal fracture.

## 2016-02-15 IMAGING — CR DG FOOT COMPLETE 3+V*L*
3 series · 3 of 3 positions shown · non-contrast
Comparison: None

CLINICAL DATA: Stepped out of his truck and rolled his ankle
earlier today, pain throughout ankle and proximal LEFT foot
radiating proximally throughout LEFT lower leg and LEFT knee,
initial encounter

EXAM:
LEFT FOOT - COMPLETE 3+ VIEW

[x foot ap left]
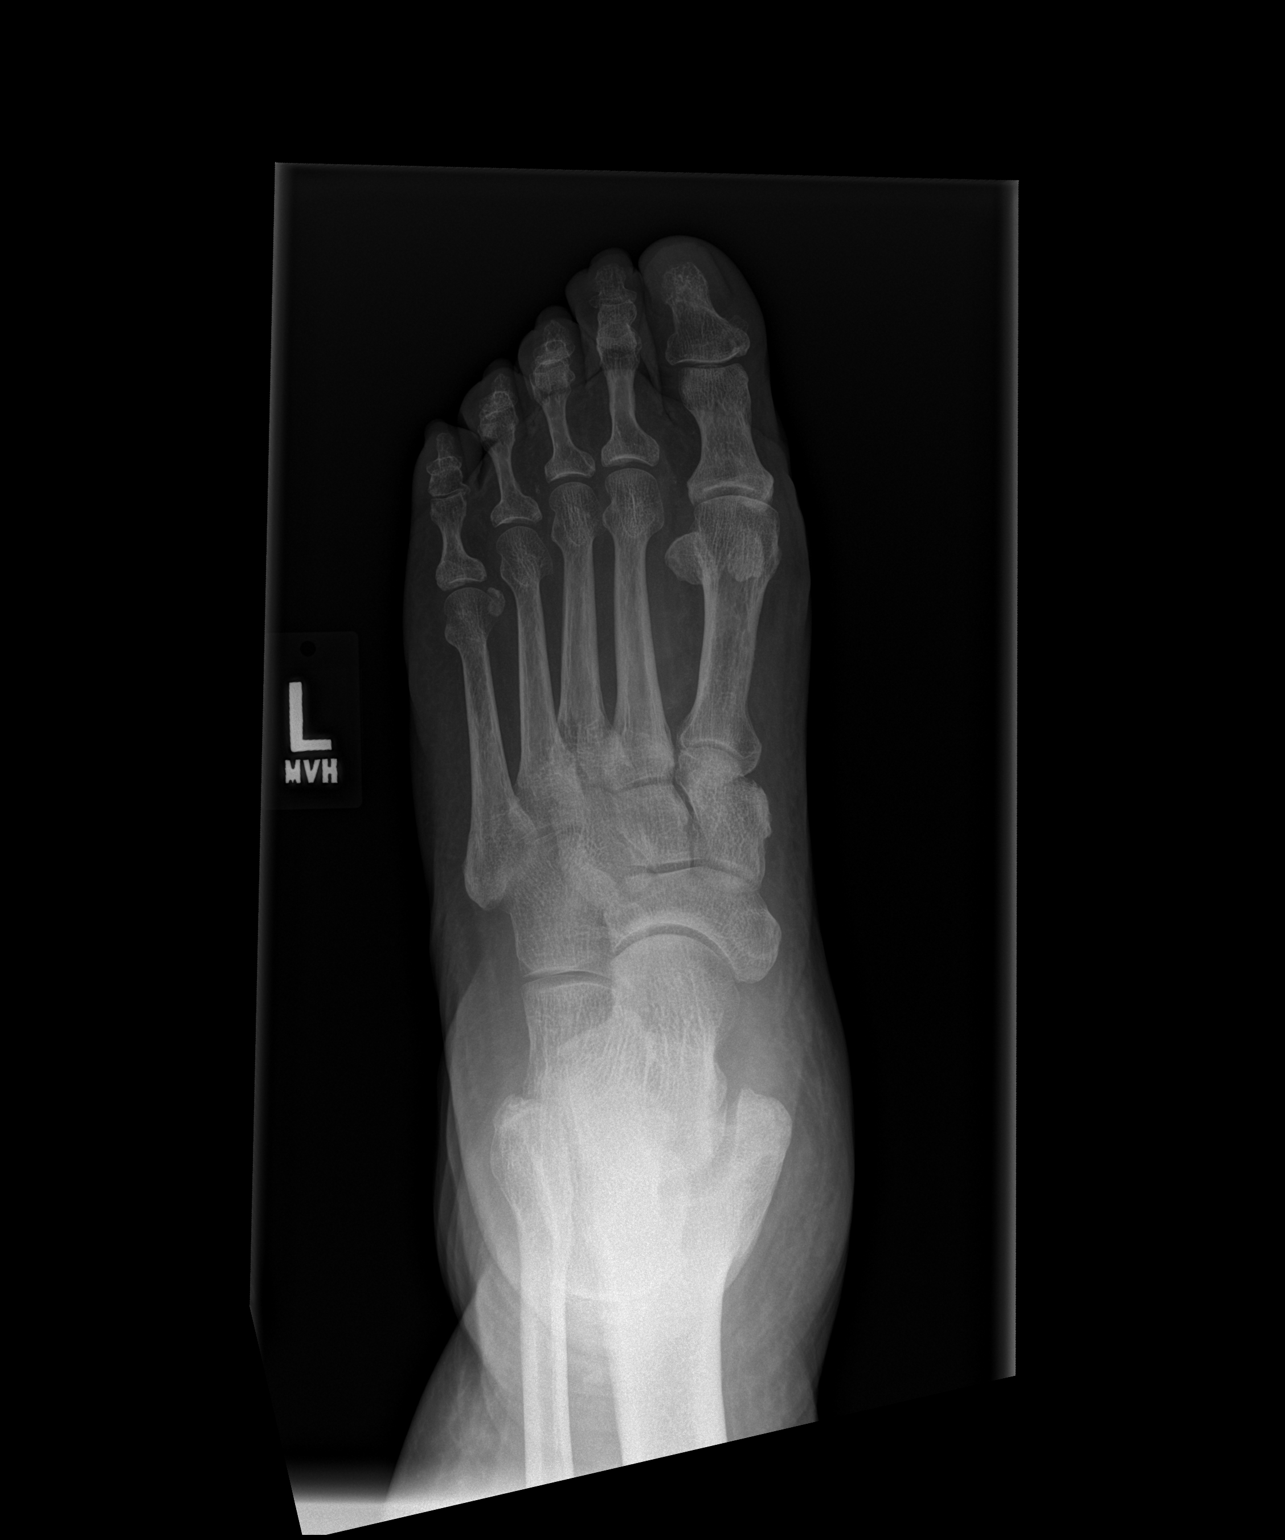

[x foot obl left]
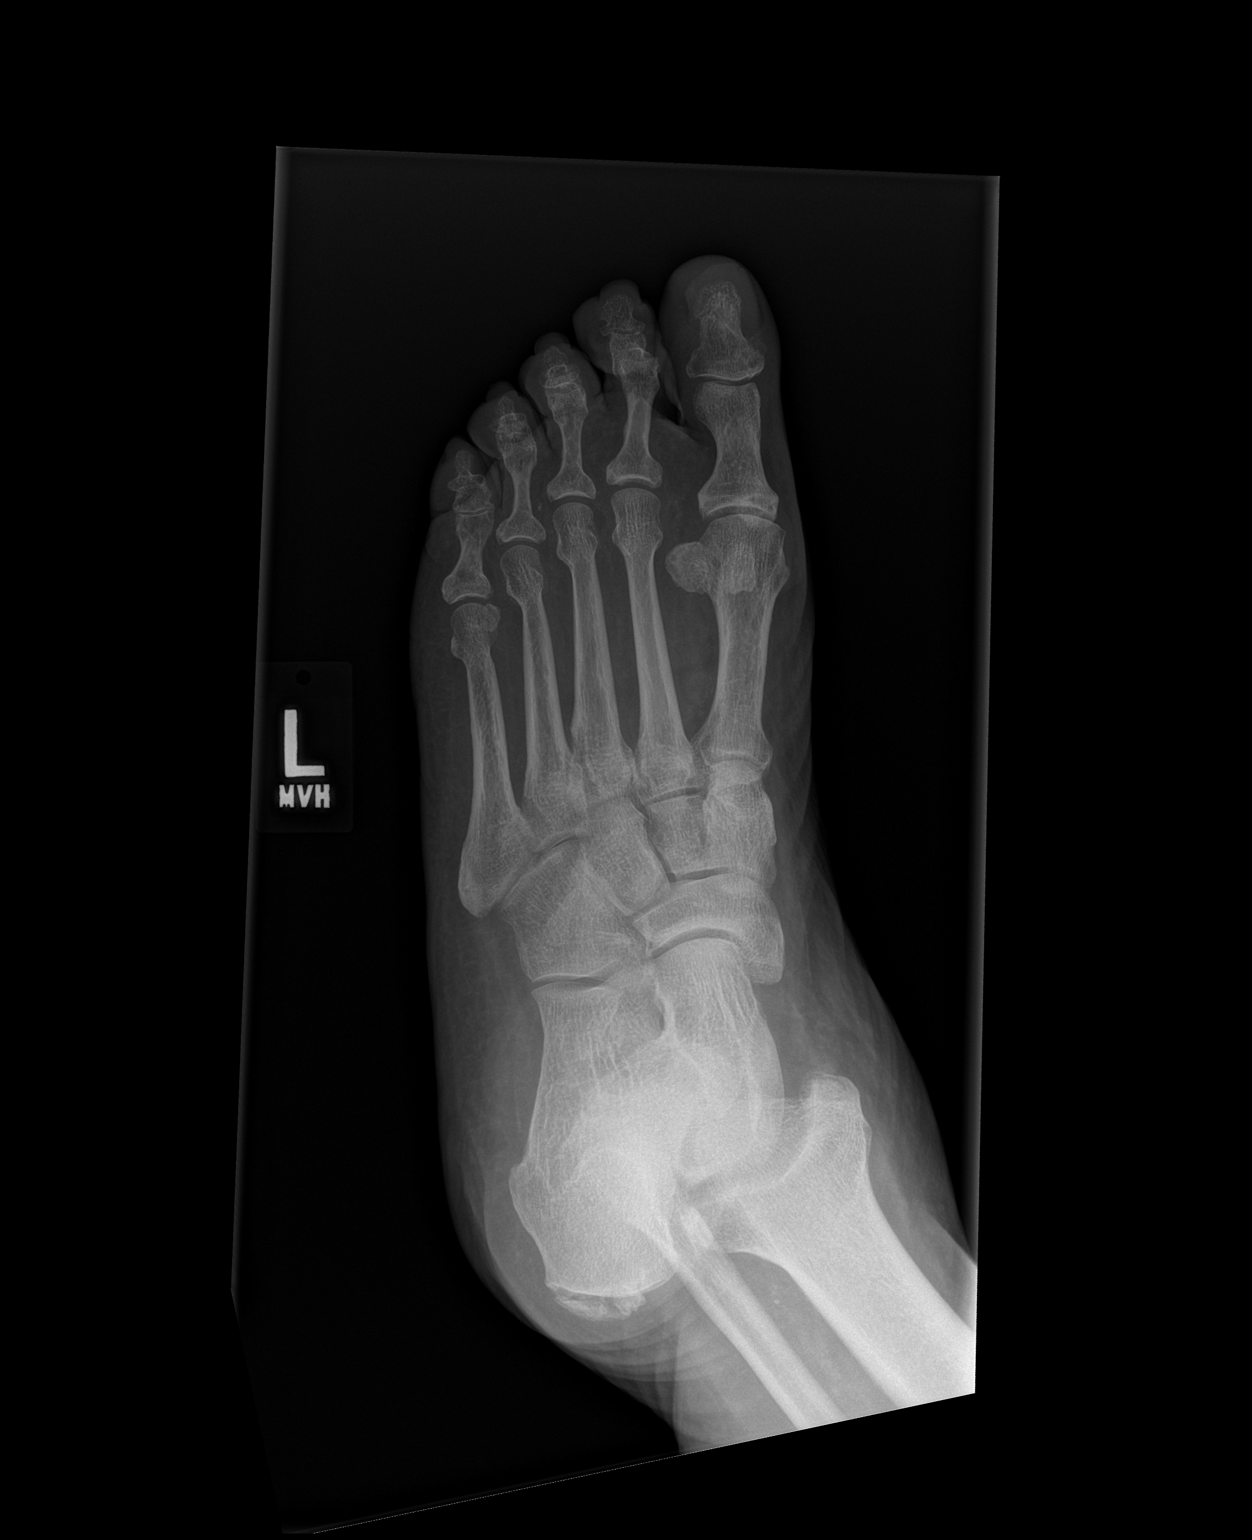

[x foot lat left]
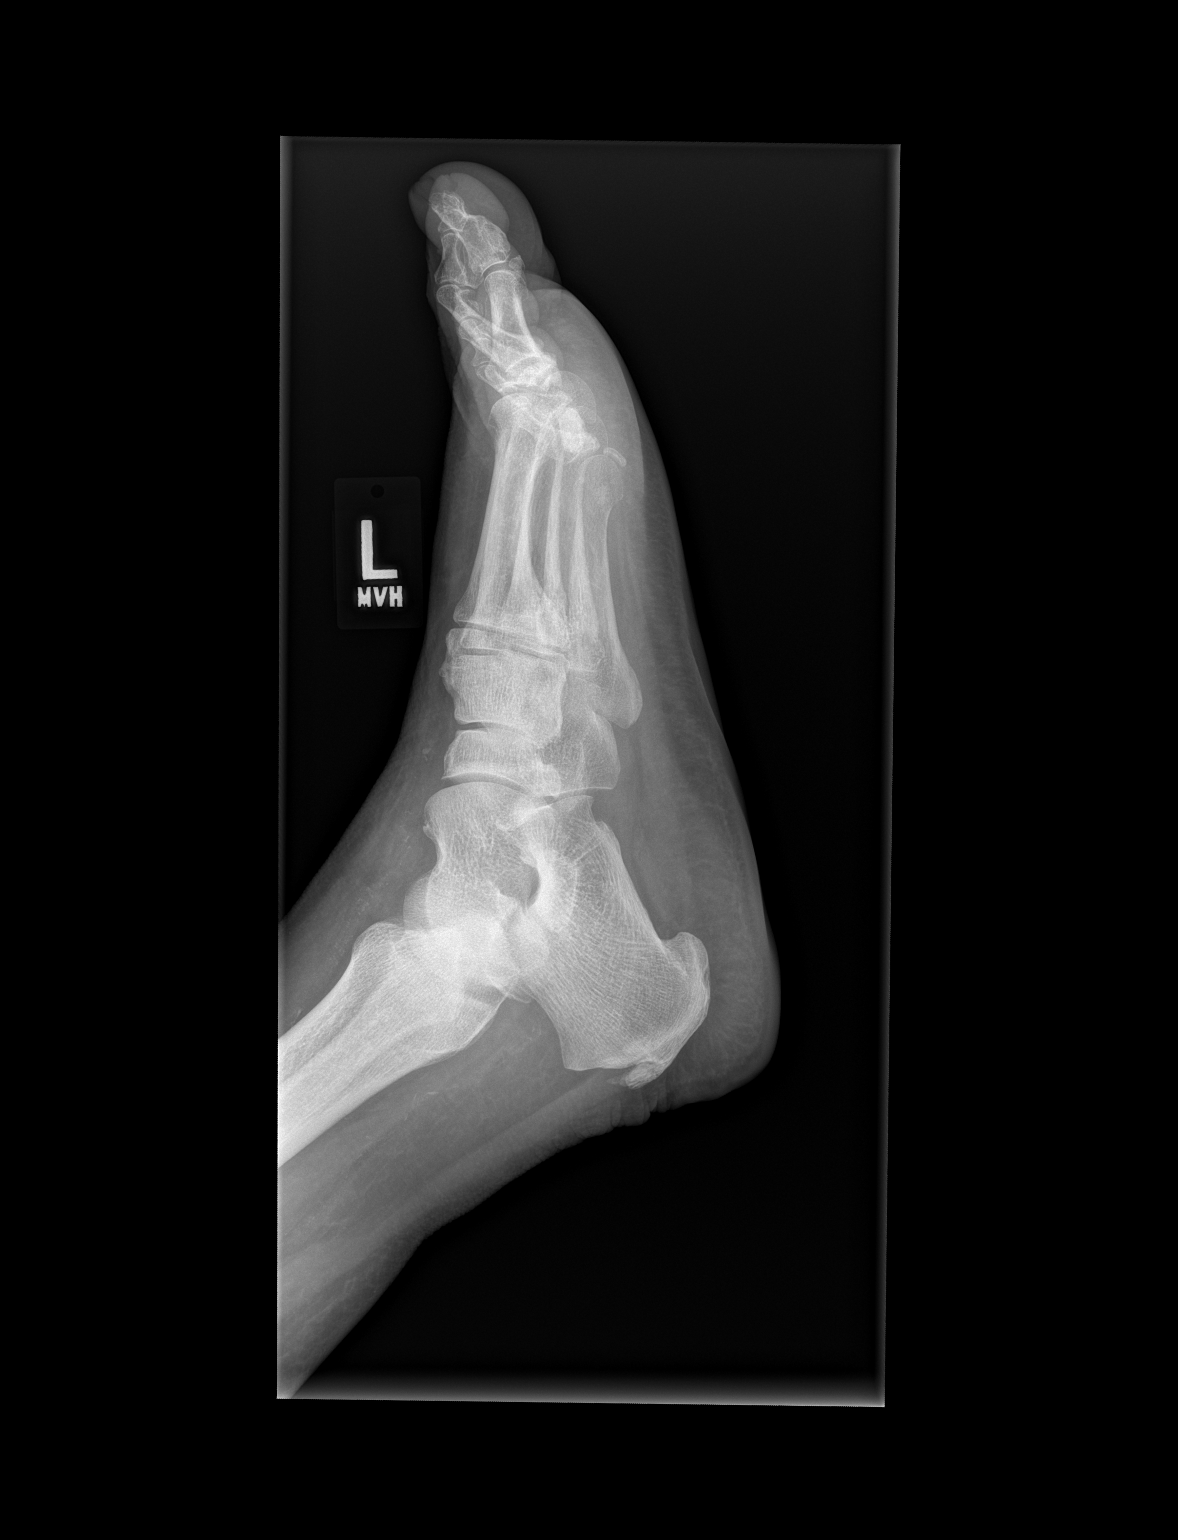

[3 of 3 positions shown; findings below may reference images not displayed]

FINDINGS: Osseous demineralization.

Joint spaces preserved.

No acute fracture, dislocation, or bone destruction.

Soft tissue swelling at ankle.

Large Achilles insertion calcaneal spur.

Scattered small vessel vascular calcifications at ankle.
IMPRESSION: No acute osseous abnormalities.

## 2016-02-15 IMAGING — CR DG KNEE COMPLETE 4+V*L*
4 series · 4 of 4 positions shown · non-contrast
Comparison: LEFT knee radiographs [DATE]

CLINICAL DATA: Stepped out of his truck and rolled his ankle
earlier today, pain throughout ankle and proximal LEFT foot
radiating proximally throughout LEFT lower leg and LEFT knee,
initial encounter

EXAM:
LEFT KNEE - COMPLETE 4+ VIEW

[x knee ap left (1 of 3)]
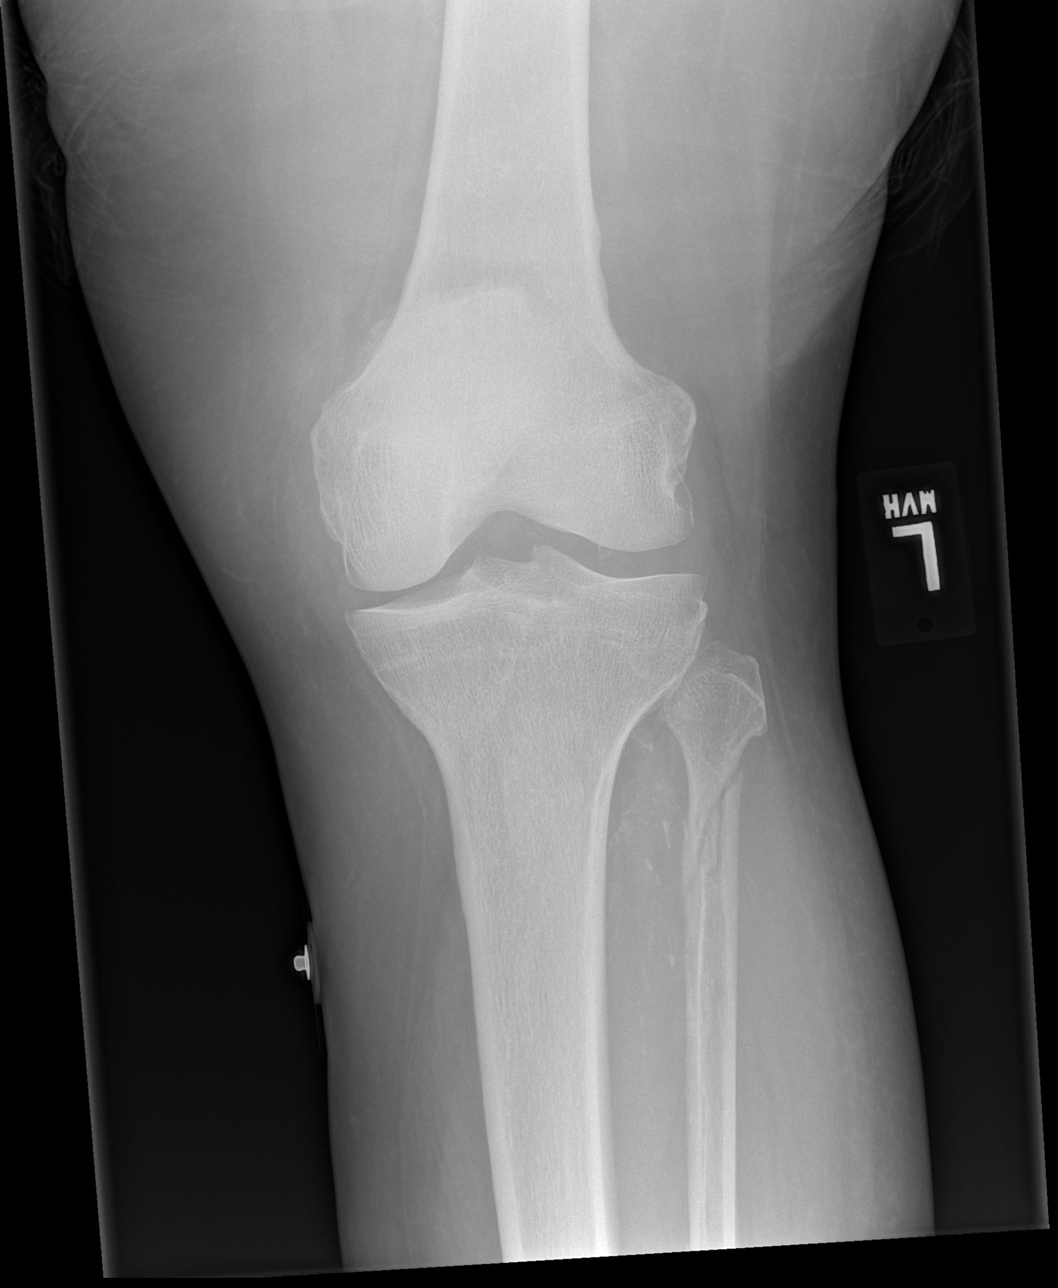

[x knee ap left (2 of 3)]
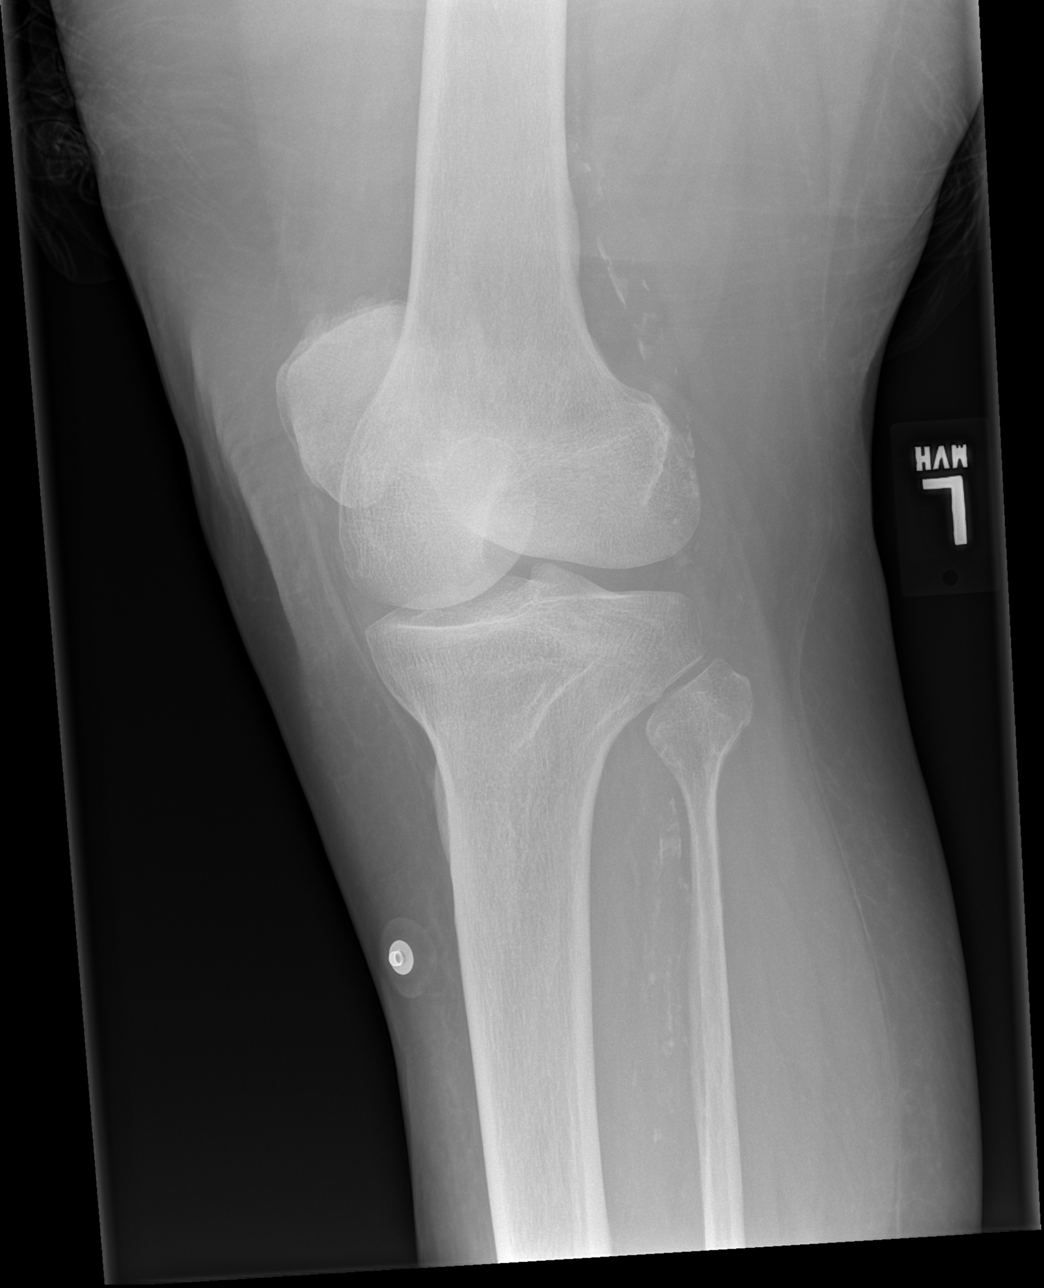

[x knee ap left (3 of 3)]
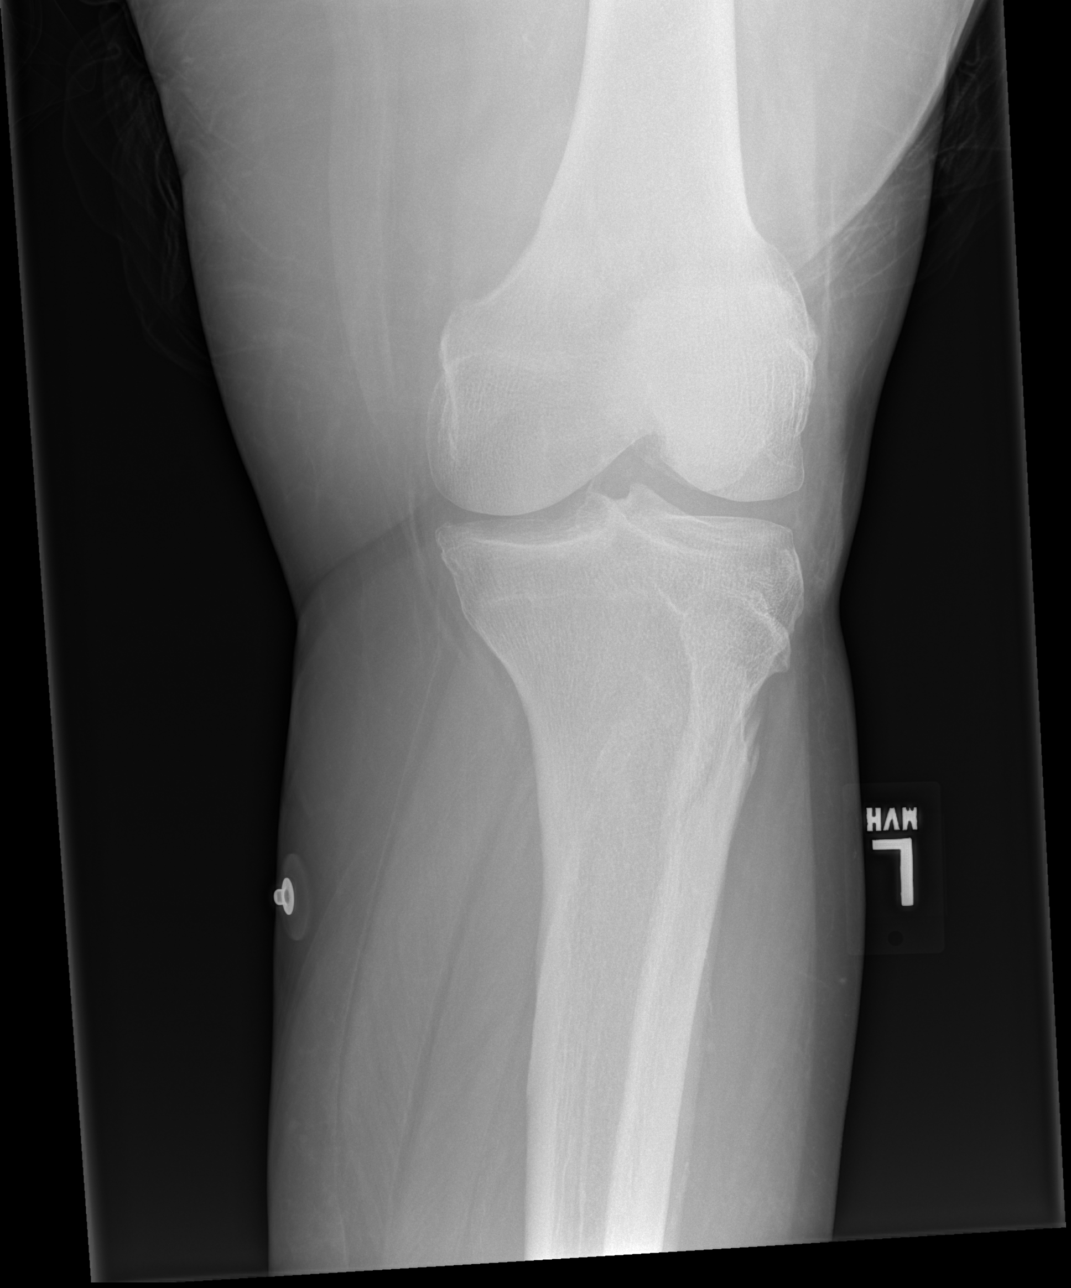

[x knee lat left]
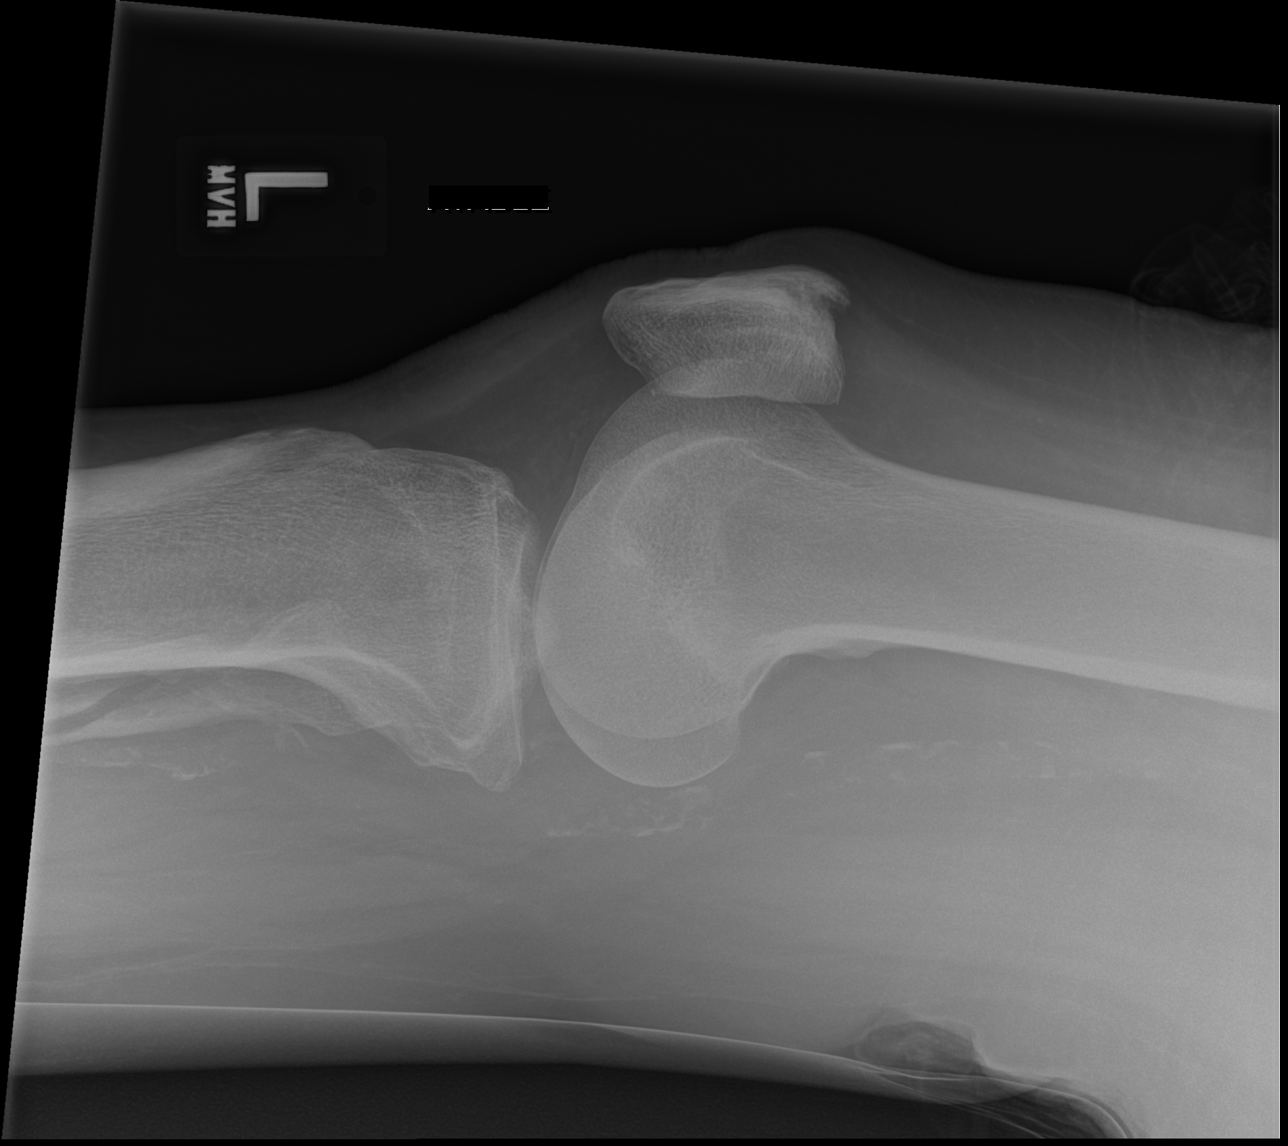

[4 of 4 positions shown; findings below may reference images not displayed]

FINDINGS: Osseous demineralization.

Medial compartment joint space narrowing.

Oblique nondisplaced fracture of proximal LEFT fibular diaphysis.

No additional fracture, dislocation or bone destruction.

No knee joint effusion.

Patellar spur at quadriceps tendon insertion.

Atherosclerotic calcification aorta.
IMPRESSION: Oblique nondisplaced fracture of proximal LEFT fibular diaphysis.

## 2016-02-15 MED ORDER — ACETAMINOPHEN 500 MG PO TABS: 1000.0000 mg | ORAL_TABLET | Freq: Three times a day (TID) | ORAL | 0 refills | Status: AC

## 2016-02-15 MED ORDER — HYDROCODONE-ACETAMINOPHEN 5-325 MG PO TABS: 1.0000 | ORAL_TABLET | Freq: Three times a day (TID) | ORAL | 0 refills | Status: AC | PRN

## 2016-02-15 MED ORDER — DEXTROSE 50 % IV SOLN
1.0000 | INTRAVENOUS | Status: DC | PRN
Start: 2016-02-15 — End: 2016-02-16

## 2016-02-15 MED ORDER — FENTANYL CITRATE (PF) 100 MCG/2ML IJ SOLN
100.0000 ug | Freq: Once | INTRAMUSCULAR | Status: AC
  Administered 2016-02-15: 100 ug via INTRAVENOUS
  Filled 2016-02-15: qty 2

## 2016-02-15 MED ORDER — HYDROCODONE-ACETAMINOPHEN 5-325 MG PO TABS
1.0000 | ORAL_TABLET | Freq: Once | ORAL | Status: AC
  Administered 2016-02-15: 1 via ORAL
  Filled 2016-02-15: qty 1

## 2016-02-15 MED ORDER — DEXTROSE-NACL 5-0.45 % IV SOLN
INTRAVENOUS | Status: DC
  Administered 2016-02-15: 17:00:00 via INTRAVENOUS

## 2016-02-15 MED ORDER — FENTANYL CITRATE (PF) 100 MCG/2ML IJ SOLN
75.0000 ug | Freq: Once | INTRAMUSCULAR | Status: AC
  Administered 2016-02-15: 75 ug via INTRAVENOUS
  Filled 2016-02-15: qty 2

## 2016-02-15 NOTE — Progress Notes (Signed)
ED CM assisted with bedside commode, order of wheelchair, educating male friend on setup of bedside commode  Referred to EDP for discussion of labs and imaging  Discussed w/c from [facility] if needed

## 2016-02-15 NOTE — Progress Notes (Addendum)
EDCM spoke to patient and family member at bedside.  Patient's caregiver chose Homecare providers for home health services.  [facility] called homecare providers however, they do not provide skilled nursing services.  Caregiver then chose [facility].  EDCM called [facility] and spoke to Manuela Schwartz who reports they do not accept [employer] in [place].  EDCM called [facility] and spoke to [place], they also do not accept [employer].  EDCM called [facility] ([facility]) and spoke to [place] who reports will accept the patient.  Patient and caregiver agreeable to accept home health services with [place].  Bethena Roys of [facility] reports they will be able to provide PT as early as tomorrow.  EDCM will speak to EDP to place home health orders with face to face.  Caregiver provided phone number [phone] or [phone].  No further EDCM needs at this time.  02/15/2016 2236pm A. Fenwick faxed home health orders to [place] [facility] with confirmation of receipt.  02/15/2016 2248pm  Correction to note above.  Patient will be discharged with the home health services of [place]. EDCM spoke to [place] earlier in shift.  Informed patient.  Patient is agreeable to services with [facility].

## 2016-02-15 NOTE — ED Triage Notes (Signed)
Per [facility], patient stepped out of his vehicle and rolled his ankle. Patient complaining of left ankle pain. Swelling noted. Patient fell to the ground. Denies hitting head or loss of consciousness. Patient is alert, oriented x4. [facility] checked his blood glucose and it was 61. Patient given 15 grams of insta-glucose. Patient's blood sugar remained 61. Patient is coming from home.

## 2016-02-15 NOTE — ED Notes (Signed)
Patient given orange juice to drink.

## 2016-02-15 NOTE — ED Notes (Signed)
Bed: [facility] Expected date:  Expected time:  Means of arrival:  Comments: 

## 2016-02-15 NOTE — ED Provider Notes (Signed)
[facility] DEPT Provider Note   CSN: [id number] Arrival date & time: 02/15/16  1429     History   Chief Complaint Chief Complaint  Patient presents with  . Fall  . Hypoglycemia    HPI Nathan Proctor. is a 75 y.o. male.  HPI Patient presents today after mechanical fall while trying to get out of his pickup truck. Patient reports that he rolled his left ankle and fell on his left leg. Denies any head trauma or other injuries. EMS was called to check the patient's glucose and patient noted to be hypoglycemic in the 60s. Patient denied any lightheadedness, blurry vision, loss of consciousness, or syncope prior to the fall. Patient reports that he was recently put back on his medication 1 month ago after taken a 2 month hiatus she decided to do on his own. Patient is on insulin and metformin for his diabetes and his insulin was increased from 30 units to 60 units daily. Patient does not check his glucose regularly and does not know the last reading.  Patient's only complaint currently is left ankle and leg pain. Past Medical History:  Diagnosis Date  . Cancer [facility])    Prostate  . Diabetes mellitus without complication ([place])     There are no active problems to display for this patient.   Past Surgical History:  Procedure Laterality Date  . PROSTATE SURGERY         Home Medications    Prior to Admission medications   Medication Sig Start Date End Date Taking? Authorizing Provider  allopurinol (ZYLOPRIM) 100 MG tablet Take 100 mg by mouth daily. 01/25/16  Yes Historical Provider, MD  amLODipine (NORVASC) 10 MG tablet Take 10 mg by mouth daily.   Yes Historical Provider, MD  atorvastatin (LIPITOR) 40 MG tablet Take 40 mg by mouth daily.   Yes Historical Provider, MD  metFORMIN (GLUCOPHAGE-XR) 750 MG 24 hr tablet Take 750 mg by mouth 2 (two) times daily as needed for high blood sugar. 01/25/16  Yes Historical Provider, MD  Multiple Vitamin (MULTIVITAMIN WITH MINERALS)  TABS tablet Take 1 tablet by mouth daily.   Yes Historical Provider, MD  acetaminophen (TYLENOL) 500 MG tablet Take 2 tablets (1,000 mg total) by mouth every 8 (eight) hours. Do not take more than 4000 mg of acetaminophen (Tylenol) in a 24-hour period. Please note that other medicines that you may be prescribed may have Tylenol as well. 02/15/16 02/20/16  Fatima Blank, MD  carvedilol (COREG) 25 MG tablet Take 25 mg by mouth 2 (two) times daily with a meal.    Historical Provider, MD  gabapentin (NEURONTIN) 300 MG capsule Take 300 mg by mouth daily.    Historical Provider, MD  HYDROcodone-acetaminophen (NORCO/VICODIN) 5-325 MG tablet Take 1 tablet by mouth every 8 (eight) hours as needed for severe pain (That is not improved by your scheduled acetaminophen regimen). Please do not exceed 4000 mg of acetaminophen (Tylenol) a 24-hour period. Please note that he may be prescribed additional medicine that contains acetaminophen. 02/15/16 02/20/16  Fatima Blank, MD  lubiprostone (AMITIZA) 24 MCG capsule Take 24 mcg by mouth daily with breakfast.    Historical Provider, MD    Family History No family history on file.  Social History Social History  Substance Use Topics  . Smoking status: Never Smoker  . Smokeless tobacco: Never Used  . Alcohol use No     Allergies   Review of patient's allergies indicates no known allergies.  Review of Systems Review of Systems Ten systems are reviewed and are negative for acute change except as noted in the HPI   Physical Exam Updated Vital Signs BP 128/82 (BP Location: Right Arm)   Pulse 78   Temp 97.5 F (36.4 C) (Oral)   Resp 18   Ht 5\' 10"  (1.778 m)   Wt (!) 350 lb (158.8 kg)   SpO2 98%   BMI 50.22 kg/m   Physical Exam  Constitutional: He is oriented to person, place, and time. He appears well-developed and well-nourished. No distress.  obese  HENT:  Head: Normocephalic and atraumatic.  Right Ear: External ear normal.    Left Ear: External ear normal.  Nose: Nose normal.  Mouth/Throat: Oropharynx is clear and moist.  Eyes: Conjunctivae and EOM are normal. Pupils are equal, round, and reactive to light. Right eye exhibits no discharge. Left eye exhibits no discharge. No scleral icterus.  Neck: Normal range of motion. Neck supple.  Cardiovascular: Normal rate, regular rhythm and normal heart sounds.  Exam reveals no gallop and no friction rub.   No murmur heard. Pulses:      Radial pulses are 2+ on the right side, and 2+ on the left side.       Dorsalis pedis pulses are 1+ on the right side, and 1+ on the left side.  Pulmonary/Chest: Effort normal and breath sounds normal. No stridor. No respiratory distress. He has no rales.  Abdominal: Soft. He exhibits no distension. There is no tenderness.  Musculoskeletal: He exhibits no edema.       Left ankle: He exhibits swelling, ecchymosis and deformity. Tenderness. Lateral malleolus and medial malleolus tenderness found.       Cervical back: He exhibits no bony tenderness.       Thoracic back: He exhibits no bony tenderness.       Lumbar back: He exhibits no bony tenderness.       Left lower leg: He exhibits tenderness.  Clavicle stable. Chest stable to AP/Lat compression. Pelvis stable to Lat compression.    Neurological: He is alert and oriented to person, place, and time. GCS eye subscore is 4. GCS verbal subscore is 5. GCS motor subscore is 6.  Moving all extremities   Skin: Skin is warm and dry. No rash noted. He is not diaphoretic. No erythema.  Psychiatric: He has a normal mood and affect.  Vitals reviewed.    ED Treatments / Results  Labs (all labs ordered are listed, but only abnormal results are displayed) Labs Reviewed  CBC - Abnormal; Notable for the following:       Result Value   WBC 12.0 (*)    RBC 3.65 (*)    Hemoglobin 10.9 (*)    HCT 35.3 (*)    All other components within normal limits  BASIC METABOLIC PANEL - Abnormal; Notable  for the following:    Glucose, Bld 58 (*)    BUN 25 (*)    Creatinine, Ser 1.56 (*)    GFR calc non Af Amer 41 (*)    GFR calc Af Amer 48 (*)    All other components within normal limits  CBG MONITORING, ED - Abnormal; Notable for the following:    Glucose-Capillary 45 (*)    All other components within normal limits  CBG MONITORING, ED - Abnormal; Notable for the following:    Glucose-Capillary 55 (*)    All other components within normal limits  CBG MONITORING, ED - Abnormal; Notable for  the following:    Glucose-Capillary 61 (*)    All other components within normal limits  CBG MONITORING, ED - Abnormal; Notable for the following:    Glucose-Capillary 162 (*)    All other components within normal limits  CBG MONITORING, ED - Abnormal; Notable for the following:    Glucose-Capillary 189 (*)    All other components within normal limits  CBG MONITORING, ED - Abnormal; Notable for the following:    Glucose-Capillary 213 (*)    All other components within normal limits    EKG  EKG Interpretation None       Radiology Dg Tibia/fibula Left  Result Date: 02/15/2016 CLINICAL DATA:  Stepped out of his truck and rolled his ankle earlier today, pain throughout ankle and proximal LEFT foot radiating proximally throughout LEFT lower leg and LEFT knee, initial encounter EXAM: LEFT TIBIA AND FIBULA - 2 VIEW COMPARISON:  04/03/2004 FINDINGS: Osseous demineralization. Joint spaces preserved. Oblique nondisplaced fracture proximal fibular diaphysis. No additional fracture, dislocation, or bone destruction. Increased atherosclerotic calcifications since previous exam. Soft tissue swelling at ankle. IMPRESSION: Nondisplaced oblique proximal LEFT fibular diaphyseal fracture. Electronically Signed   By: Lavonia Dana M.D.   On: 02/15/2016 15:40   Dg Ankle Complete Left  Result Date: 02/15/2016 CLINICAL DATA:  Stepped out of his truck and rolled his ankle earlier today, pain throughout ankle and  proximal LEFT foot radiating proximally throughout LEFT lower leg and LEFT knee, initial encounter EXAM: LEFT ANKLE COMPLETE - 3+ VIEW COMPARISON:  None FINDINGS: Marked widening of the medial joint line on AP view, appears reduced on oblique view, consistent with ligamentous disruption. Bones appear slightly demineralized. No definite fracture or gross dislocation seen. Large Achilles insertion calcaneal spur. IMPRESSION: Marked widening of the medial ankle joint on AP view reduced on oblique and lateral views, consistent with medial ligamentous injury and subluxation. No acute fracture or dislocation identified. Electronically Signed   By: Lavonia Dana M.D.   On: 02/15/2016 15:39   Dg Knee Complete 4 Views Left  Result Date: 02/15/2016 CLINICAL DATA:  Stepped out of his truck and rolled his ankle earlier today, pain throughout ankle and proximal LEFT foot radiating proximally throughout LEFT lower leg and LEFT knee, initial encounter EXAM: LEFT KNEE - COMPLETE 4+ VIEW COMPARISON:  LEFT knee radiographs 04/03/2004 FINDINGS: Osseous demineralization. Medial compartment joint space narrowing. Oblique nondisplaced fracture of proximal LEFT fibular diaphysis. No additional fracture, dislocation or bone destruction. No knee joint effusion. Patellar spur at quadriceps tendon insertion. Atherosclerotic calcification aorta. IMPRESSION: Oblique nondisplaced fracture of proximal LEFT fibular diaphysis. Electronically Signed   By: Lavonia Dana M.D.   On: 02/15/2016 15:42   Dg Foot Complete Left  Result Date: 02/15/2016 CLINICAL DATA:  Stepped out of his truck and rolled his ankle earlier today, pain throughout ankle and proximal LEFT foot radiating proximally throughout LEFT lower leg and LEFT knee, initial encounter EXAM: LEFT FOOT - COMPLETE 3+ VIEW COMPARISON:  None FINDINGS: Osseous demineralization. Joint spaces preserved. No acute fracture, dislocation, or bone destruction. Soft tissue swelling at ankle. Large  Achilles insertion calcaneal spur. Scattered small vessel vascular calcifications at ankle. IMPRESSION: No acute osseous abnormalities. Electronically Signed   By: Lavonia Dana M.D.   On: 02/15/2016 15:35    Procedures Procedures (including critical care time)  Medications Ordered in ED Medications  dextrose 50 % solution 50 mL (not administered)  dextrose 5 %-0.45 % sodium chloride infusion ( Intravenous Stopped 02/15/16 1907)  fentaNYL (SUBLIMAZE)  injection 100 mcg (100 mcg Intravenous Given 02/15/16 1548)  fentaNYL (SUBLIMAZE) injection 75 mcg (75 mcg Intravenous Given 02/15/16 1816)     Initial Impression / Assessment and Plan / ED Course  I have reviewed the triage vital signs and the nursing notes.  Pertinent labs & imaging results that were available during my care of the patient were reviewed by me and considered in my medical decision making (see chart for details).  Clinical Course     1. Hypoglycemia On metformin and Humulin 60U daily (recently raised 1 mo ago by PCP; previously at 30U daily). Patient given juice and diet. Monitor for several hours with stable CBG.  2. Left ankle pain Deformity. Pulses intact.plain films with Maisonneuve fracture and syndesmosis. Placed in a posterior leg splint. Consult and orthopedic who will follow-up.  Given the patient's stable CBG he is appropriate for discharge. Consult the social worker for home health assistance. Patient provided with home equipment.  Patient was instructed to follow-up with orthopedic surgery and contact his primary care provider in the morning for recommendations of insulin adjustment.  Final Clinical Impressions(s) / ED Diagnoses   Final diagnoses:  Left ankle pain  Hypoglycemia  Closed displaced Maisonneuve fracture of left lower extremity, initial encounter  Closed fracture of proximal end of left fibula, unspecified fracture morphology, initial encounter   Disposition: Discharge  Condition:  Good  I have discussed the results, Dx and Tx plan with the patient who expressed understanding and agree(s) with the plan. Discharge instructions discussed at great length. The patient was given strict return precautions who verbalized understanding of the instructions. No further questions at time of discharge.    New Prescriptions   ACETAMINOPHEN (TYLENOL) 500 MG TABLET    Take 2 tablets (1,000 mg total) by mouth every 8 (eight) hours. Do not take more than 4000 mg of acetaminophen (Tylenol) in a 24-hour period. Please note that other medicines that you may be prescribed may have Tylenol as well.   HYDROCODONE-ACETAMINOPHEN (NORCO/VICODIN) 5-325 MG TABLET    Take 1 tablet by mouth every 8 (eight) hours as needed for severe pain (That is not improved by your scheduled acetaminophen regimen). Please do not exceed 4000 mg of acetaminophen (Tylenol) a 24-hour period. Please note that he may be prescribed additional medicine that contains acetaminophen.    Follow Up: [facility] [place] [id number] [place] [address] [phone]   This is the company that has assisted with your equipment- bedside commode, walker If a wheelchair is still needed at discharge please call this number and ask if it can be picked up at the address [place], [place] [address] [place] [address] [phone]  Schedule an appointment as soon as possible for a visit in 1 day For close follow up to assess for left leg fracture  Foye Spurling, MD [address] [place] [address] [phone]  Call  for recommendation of insulin adjustment.      Fatima Blank, MD 02/15/16 2226

## 2016-02-15 NOTE — Progress Notes (Signed)
CM reviewed in details medicare guidelines, Choices of home health [facility]) (length of stay in home, types of [facility] staff available, coverage, primary caregiver, up to 24 hrs before services may be started) and choices of Private duty nursing (PDN-coverage, length of stay in the home types of staff available). CM reviewed availability of [facility] SW to assist pcp to get pt to snf (if desired disposition) from the community level. Discussed with pt and male visitor that generally pt are not sent to snf from ED Discussed ED is for stabilizing pt for d/c home or for admission if unable to safely d/c home for medical reason Pt frequently noted to be moving his left foot and right foot bent on bed with left foot on top of it at intervals CM provided pt/family with a list of [place] home health agencies and PDN.  Answered all questions Male states she would be primary caregiver and noted interested in home health but not pt Left them to discuss the options ED RN in room to establish an iv access

## 2016-02-15 NOTE — Progress Notes (Signed)
Cm noted patient stepped out of his vehicle and rolled his ankle. Patient complaining of left ankle pain. Swelling noted. Patient fell to the ground  Cm spoke with pt He has his male friend at bedside Pt denies having a cane or walker at home but states he may need one Pt taken to imaging CM spoke with Brenton Grills of [facility] about need for walker for pt Order entered in [place]

## 2016-02-15 NOTE — Progress Notes (Signed)
IF PATIENT IS DISCHARGED THIS EVENING PLEASE PLACE HOME HEALTH ORDERS FOR:  PT, OT, AIDE, AND SW WITH  FACE TO FACE  THANKS

## 2016-02-15 NOTE — Care Management Note (Addendum)
Case Management Note  Patient Details  Name: Nathan Proctor. MRN: [id number] Date of Birth: 10/17/1940  Subjective/Objective: Patient with fall at home with injury to his left leg.                 Action/Plan: Patient to be discharged home with services of [facility]   Expected Discharge Date:                  Expected Discharge Plan:  [place]  In-House Referral:     Discharge planning Services  CM Consult  Post Acute Care Choice:  Durable Medical Equipment, Home Health Choice offered to:  Patient, Spouse  DME Arranged:  3-N-1, Walker rolling, Wheelchair manual DME Agency:  [place]:  PT, OT, Nurse's Aide, Social Work [employer] Agency:  [employer] (now [facility], Patient sent home with home health services of [place].  Informed patient.  Patient agreeable to receive services with [facility]  Status of Service:  Completed, signed off  If discussed at [place] of Stay Meetings, dates discussed:    Additional CommentsLivia Snellen, RN 02/15/2016, 10:39 PM

## 2016-02-15 NOTE — ED Notes (Signed)
CBG was 162

## 2016-02-15 NOTE — Discharge Instructions (Signed)
Only take 30 units of Humulin in the morning until you contact your primary care provider for further recommendations. Please return to primary care provider in the morning.

## 2016-02-15 NOTE — ED Notes (Signed)
Bed: WA03 Expected date:  Expected time:  Means of arrival:  Comments: [facility] A- hypoglycemia

## 2016-02-15 NOTE — Consult Note (Signed)
Reason for Consult:Left ankle fracture Referring Physician: Dr. Maryclare Bean. is an 75 y.o. male.  HPI: 75 yo male who mis-stepped and twisted his left leg earlier this evening leading to immediate left ankle pain, deformity and inability to bear weight. Also had hypoglycemia. Denies numbness/tingling in his left leg and states he has diabetes but no neuropathy. Also with left lateral knee pain. No hip pain. Taken to ED where evaluation revealed Maissonueve fracture LLE. Being admitted by medical team for glucose control  Past Medical History:  Diagnosis Date  . Cancer [facility])    Prostate  . Diabetes mellitus without complication [facility])     Past Surgical History:  Procedure Laterality Date  . PROSTATE SURGERY      No family history on file.  Social History:  reports that he has never smoked. He has never used smokeless tobacco. He reports that he does not drink alcohol. His drug history is not on file.  Allergies: No Known Allergies  Medications: I have reviewed the patient's current medications.  Results for orders placed or performed during the hospital encounter of 02/15/16 (from the past 48 hour(s))  CBG monitoring, ED     Status: Abnormal   Collection Time: 02/15/16  2:39 PM  Result Value Ref Range   Glucose-Capillary 45 (L) 65 - 99 mg/dL  CBG monitoring, ED (now and then every hour for 3 hours)     Status: Abnormal   Collection Time: 02/15/16  3:39 PM  Result Value Ref Range   Glucose-Capillary 55 (L) 65 - 99 mg/dL  CBC     Status: Abnormal   Collection Time: 02/15/16  3:41 PM  Result Value Ref Range   WBC 12.0 (H) 4.0 - 10.5 K/uL   RBC 3.65 (L) 4.22 - 5.81 MIL/uL   Hemoglobin 10.9 (L) 13.0 - 17.0 g/dL   HCT 35.3 (L) 39.0 - 52.0 %   MCV 96.7 78.0 - 100.0 fL   MCH 29.9 26.0 - 34.0 pg   MCHC 30.9 30.0 - 36.0 g/dL   RDW 14.9 11.5 - 15.5 %   Platelets 233 150 - 400 K/uL  Basic metabolic panel     Status: Abnormal   Collection Time: 02/15/16  3:41 PM   Result Value Ref Range   Sodium 144 135 - 145 mmol/L   Potassium 4.2 3.5 - 5.1 mmol/L   Chloride 108 101 - 111 mmol/L   CO2 26 22 - 32 mmol/L   Glucose, Bld 58 (L) 65 - 99 mg/dL   BUN 25 (H) 6 - 20 mg/dL   Creatinine, Ser 1.56 (H) 0.61 - 1.24 mg/dL   Calcium 10.0 8.9 - 10.3 mg/dL   GFR calc non Af Amer 41 (L) >60 mL/min   GFR calc Af Amer 48 (L) >60 mL/min    Comment: (NOTE) The eGFR has been calculated using the CKD EPI equation. This calculation has not been validated in all clinical situations. eGFR's persistently <60 mL/min signify possible Chronic Kidney Disease.    Anion gap 10 5 - 15  CBG monitoring, ED (now and then every hour for 3 hours)     Status: Abnormal   Collection Time: 02/15/16  5:02 PM  Result Value Ref Range   Glucose-Capillary 61 (L) 65 - 99 mg/dL  CBG monitoring, ED (now and then every hour for 3 hours)     Status: Abnormal   Collection Time: 02/15/16  8:02 PM  Result Value Ref Range   Glucose-Capillary 162 (  H) 65 - 99 mg/dL  POC CBG, ED     Status: Abnormal   Collection Time: 02/15/16  9:08 PM  Result Value Ref Range   Glucose-Capillary 189 (H) 65 - 99 mg/dL  POC CBG, ED     Status: Abnormal   Collection Time: 02/15/16  9:50 PM  Result Value Ref Range   Glucose-Capillary 213 (H) 65 - 99 mg/dL   Comment 1 Notify RN    Comment 2 Document in Chart     Dg Tibia/fibula Left  Result Date: 02/15/2016 CLINICAL DATA:  Stepped out of his truck and rolled his ankle earlier today, pain throughout ankle and proximal LEFT foot radiating proximally throughout LEFT lower leg and LEFT knee, initial encounter EXAM: LEFT TIBIA AND FIBULA - 2 VIEW COMPARISON:  04/03/2004 FINDINGS: Osseous demineralization. Joint spaces preserved. Oblique nondisplaced fracture proximal fibular diaphysis. No additional fracture, dislocation, or bone destruction. Increased atherosclerotic calcifications since previous exam. Soft tissue swelling at ankle. IMPRESSION: Nondisplaced oblique  proximal LEFT fibular diaphyseal fracture. Electronically Signed   By: Lavonia Dana M.D.   On: 02/15/2016 15:40   Dg Ankle Complete Left  Result Date: 02/15/2016 CLINICAL DATA:  Stepped out of his truck and rolled his ankle earlier today, pain throughout ankle and proximal LEFT foot radiating proximally throughout LEFT lower leg and LEFT knee, initial encounter EXAM: LEFT ANKLE COMPLETE - 3+ VIEW COMPARISON:  None FINDINGS: Marked widening of the medial joint line on AP view, appears reduced on oblique view, consistent with ligamentous disruption. Bones appear slightly demineralized. No definite fracture or gross dislocation seen. Large Achilles insertion calcaneal spur. IMPRESSION: Marked widening of the medial ankle joint on AP view reduced on oblique and lateral views, consistent with medial ligamentous injury and subluxation. No acute fracture or dislocation identified. Electronically Signed   By: Lavonia Dana M.D.   On: 02/15/2016 15:39   Dg Knee Complete 4 Views Left  Result Date: 02/15/2016 CLINICAL DATA:  Stepped out of his truck and rolled his ankle earlier today, pain throughout ankle and proximal LEFT foot radiating proximally throughout LEFT lower leg and LEFT knee, initial encounter EXAM: LEFT KNEE - COMPLETE 4+ VIEW COMPARISON:  LEFT knee radiographs 04/03/2004 FINDINGS: Osseous demineralization. Medial compartment joint space narrowing. Oblique nondisplaced fracture of proximal LEFT fibular diaphysis. No additional fracture, dislocation or bone destruction. No knee joint effusion. Patellar spur at quadriceps tendon insertion. Atherosclerotic calcification aorta. IMPRESSION: Oblique nondisplaced fracture of proximal LEFT fibular diaphysis. Electronically Signed   By: Lavonia Dana M.D.   On: 02/15/2016 15:42   Dg Foot Complete Left  Result Date: 02/15/2016 CLINICAL DATA:  Stepped out of his truck and rolled his ankle earlier today, pain throughout ankle and proximal LEFT foot radiating  proximally throughout LEFT lower leg and LEFT knee, initial encounter EXAM: LEFT FOOT - COMPLETE 3+ VIEW COMPARISON:  None FINDINGS: Osseous demineralization. Joint spaces preserved. No acute fracture, dislocation, or bone destruction. Soft tissue swelling at ankle. Large Achilles insertion calcaneal spur. Scattered small vessel vascular calcifications at ankle. IMPRESSION: No acute osseous abnormalities. Electronically Signed   By: Lavonia Dana M.D.   On: 02/15/2016 15:35    ROS Blood pressure 166/93, pulse 97, temperature 97.4 F (36.3 C), temperature source Oral, resp. rate 18, height _0  (1.778 m), weight (!) 158.8 kg (350 lb), SpO2 100 %. Physical Exam  Left ankle swollen and tender; no deformity; wiggles toes; sensation intact; compartments soft left leg; tender proximal fibula without deformity; no knee effusion  Xray- Maissonueve fracture left ankle/prox fibula with mortise disruption  Assessment/Plan: LLE injury- ankle reduced and splinted. Will eventually require surgical treatment once medical issue stabilizes and swelling allows for safe surgical treatment. Very problematic injury pattern in a diabetic.. I will discuss with our foot and ankle specialist and patient can follow up in our office early next week. Instructed in strict elevation to decrease swelling   Avree Szczygiel V 02/15/2016, 10:11 PM

## 2016-02-15 NOTE — ED Notes (Signed)
[facility] called for transport.  

## 2016-02-16 ENCOUNTER — Telehealth: Payer: Self-pay | Admitting: *Deleted

## 2016-02-16 ENCOUNTER — Emergency Department (HOSPITAL_COMMUNITY)
Admission: EM | Admit: 2016-02-16 | Discharge: 2016-02-16 | Discharge status: Against Medical Advice | Payer: Medicare HMO | Attending: Emergency Medicine | Admitting: Emergency Medicine

## 2016-02-16 ENCOUNTER — Encounter (HOSPITAL_COMMUNITY): Payer: Self-pay

## 2016-02-16 DIAGNOSIS — W19XXXA Unspecified fall, initial encounter: Secondary | ICD-10-CM | POA: Insufficient documentation

## 2016-02-16 DIAGNOSIS — Z7984 Long term (current) use of oral hypoglycemic drugs: Secondary | ICD-10-CM | POA: Insufficient documentation

## 2016-02-16 DIAGNOSIS — E119 Type 2 diabetes mellitus without complications: Secondary | ICD-10-CM | POA: Insufficient documentation

## 2016-02-16 DIAGNOSIS — S82892D Other fracture of left lower leg, subsequent encounter for closed fracture with routine healing: Secondary | ICD-10-CM

## 2016-02-16 DIAGNOSIS — Z8546 Personal history of malignant neoplasm of prostate: Secondary | ICD-10-CM | POA: Insufficient documentation

## 2016-02-16 HISTORY — DX: Other fracture of unspecified lower leg, initial encounter for closed fracture: S82.899A

## 2016-02-16 LAB — CBG MONITORING, ED: GLUCOSE-CAPILLARY: 88 mg/dL (ref 65–99)

## 2016-02-16 NOTE — Progress Notes (Signed)
Cm spoke with pt Pt male friend, Ricci Barker not with him today- ED RN in pt room  Cm asked pt about his DME to follow up with CM interventions from 02/15/16 but began to discussed his desire to get to a nursing home and he was told to come to the ED to be placed  States he can not use his equipment at home and has not obtained the w/c ordered for him  CM reviewed with pt again and reminded him that she discussed this with him and Elvira on 02/15/16 criteria for facility placement  Pt states he contacted his insurance company and was told they would pay for him to go to a facility CM tried to informed pt that she does not assist with facility placement but pt interrupted CM to continue to tell CM what he felt she needed to do Pt states I probably won't even need the equipment and he did not come to [facility] to "make friends but to get my leg fix" " if it wasn't for 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

## 2016-02-16 NOTE — ED Notes (Signed)
Discussed with Amy, RNCM and Dr. Rogene Houston.  Pt not a candidate for rehab at this time as he is unable to participate with SNF level PT/OT therapies.  Pt's [employer] Medicare will not pay for SNF, so pt would have to be private pay at [facility].  CSW has called pt's wife and left messages on both her cell and home numbers.  Await return call.  Creta Levin, [place] [phone]

## 2016-02-16 NOTE — Care Management (Signed)
Pt states he has spoken to [facility] and they will accept his insurance and all they needed was a referral and that he needed to return to ED to obtain it.  Pt adamant that he is returning to ED to be placed in SNF despite [facility] efforts.

## 2016-02-16 NOTE — ED Provider Notes (Signed)
[facility] DEPT Provider Note   CSN: [id number] Arrival date & time: 02/16/16  1416     History   Chief Complaint Chief Complaint  Patient presents with  . Ankle Injury  . rehab placement    HPI Nathan Proctor. is a 75 y.o. male.  Patient sent in from home arrived by Nathan Proctor for placement into rehabilitation. Patient seen yesterday by a weighted following a fall had a fracture to his left ankle is splinted in has follow-up with orthopedics. Patient's is alert and answers questions but seems to have a little bit of confusion. As a paper from somebody that was filled out same where he was supposed to go. Story started to change after he was here he wanted to go home 1 at his splint off and then he wanted to go to a group home. Got the care management and social worker involved. Patient without any acute or specific new complaints.      Past Medical History:  Diagnosis Date  . Ankle fracture 02/15/2016  . Cancer [facility])    Prostate  . Diabetes mellitus without complication ([place])     There are no active problems to display for this patient.   Past Surgical History:  Procedure Laterality Date  . PROSTATE SURGERY         Home Medications    Prior to Admission medications   Medication Sig Start Date End Date Taking? Authorizing Provider  acetaminophen (TYLENOL) 500 MG tablet Take 2 tablets (1,000 mg total) by mouth every 8 (eight) hours. Do not take more than 4000 mg of acetaminophen (Tylenol) in a 24-hour period. Please note that other medicines that you may be prescribed may have Tylenol as well. 02/15/16 02/20/16 Yes Nathan Blank, MD  allopurinol (ZYLOPRIM) 100 MG tablet Take 100 mg by mouth daily. 01/25/16  Yes Historical Provider, MD  amLODipine (NORVASC) 10 MG tablet Take 10 mg by mouth daily.   Yes Historical Provider, MD  atorvastatin (LIPITOR) 40 MG tablet Take 40 mg by mouth daily.   Yes Historical Provider, MD  carvedilol (COREG) 25 MG tablet  Take 25 mg by mouth 2 (two) times daily with a meal.   Yes Historical Provider, MD  gabapentin (NEURONTIN) 300 MG capsule Take 300 mg by mouth daily.   Yes Historical Provider, MD  HYDROcodone-acetaminophen (NORCO/VICODIN) 5-325 MG tablet Take 1 tablet by mouth every 8 (eight) hours as needed for severe pain (That is not improved by your scheduled acetaminophen regimen). Please do not exceed 4000 mg of acetaminophen (Tylenol) a 24-hour period. Please note that he may be prescribed additional medicine that contains acetaminophen. 02/15/16 02/20/16 Yes Nathan Aretha Parrot, MD  lubiprostone (AMITIZA) 24 MCG capsule Take 24 mcg by mouth daily with breakfast.   Yes Historical Provider, MD  metFORMIN (GLUCOPHAGE-XR) 750 MG 24 hr tablet Take 750 mg by mouth 2 (two) times daily as needed for high blood sugar. 01/25/16  Yes Historical Provider, MD  Multiple Vitamin (MULTIVITAMIN WITH MINERALS) TABS tablet Take 1 tablet by mouth daily.    Historical Provider, MD    Family History History reviewed. No pertinent family history.  Social History Social History  Substance Use Topics  . Smoking status: Never Smoker  . Smokeless tobacco: Never Used  . Alcohol use No     Allergies   Review of patient's allergies indicates no known allergies.   Review of Systems Review of Systems  Constitutional: Negative for fever.  HENT: Negative for congestion.  Eyes: Negative for redness.  Respiratory: Negative for shortness of breath.   Cardiovascular: Negative for chest pain.  Gastrointestinal: Negative for abdominal pain.  Genitourinary: Negative for dysuria.  Musculoskeletal: Negative for back pain.  Neurological: Negative for headaches.  Hematological: Does not bruise/bleed easily.  Psychiatric/Behavioral: Positive for confusion.     Physical Exam Updated Vital Signs BP 193/94 (BP Location: Right Arm)   Pulse 97   Temp 98.4 F (36.9 C) (Oral)   Resp 18   Ht 5\' 10"  (1.778 m)   Wt (!) 158.8 kg    SpO2 96%   BMI 50.22 kg/m   Physical Exam  Constitutional: He appears well-developed and well-nourished. No distress.  HENT:  Head: Normocephalic and atraumatic.  Mouth/Throat: Oropharynx is clear and moist.  Eyes: EOM are normal. Pupils are equal, round, and reactive to light.  Neck: Normal range of motion.  Cardiovascular: Normal rate, regular rhythm and normal heart sounds.   Pulmonary/Chest: Effort normal and breath sounds normal.  Abdominal: Soft. Bowel sounds are normal.  Musculoskeletal: Normal range of motion.  Splint left leg. Refill to great toe less than 2 seconds.  Neurological: He is alert. No cranial nerve deficit. He exhibits normal muscle tone. Coordination normal.  Skin: Skin is warm. Capillary refill takes less than 2 seconds.  Nursing note and vitals reviewed.    ED Treatments / Results  Labs (all labs ordered are listed, but only abnormal results are displayed) Labs Reviewed - No data to display  EKG  EKG Interpretation None       Radiology Dg Tibia/fibula Left  Result Date: 02/15/2016 CLINICAL DATA:  Stepped out of his truck and rolled his ankle earlier today, pain throughout ankle and proximal LEFT foot radiating proximally throughout LEFT lower leg and LEFT knee, initial encounter EXAM: LEFT TIBIA AND FIBULA - 2 VIEW COMPARISON:  04/03/2004 FINDINGS: Osseous demineralization. Joint spaces preserved. Oblique nondisplaced fracture proximal fibular diaphysis. No additional fracture, dislocation, or bone destruction. Increased atherosclerotic calcifications since previous exam. Soft tissue swelling at ankle. IMPRESSION: Nondisplaced oblique proximal LEFT fibular diaphyseal fracture. Electronically Signed   By: Lavonia Dana M.D.   On: 02/15/2016 15:40   Dg Ankle Complete Left  Result Date: 02/15/2016 CLINICAL DATA:  Stepped out of his truck and rolled his ankle earlier today, pain throughout ankle and proximal LEFT foot radiating proximally throughout  LEFT lower leg and LEFT knee, initial encounter EXAM: LEFT ANKLE COMPLETE - 3+ VIEW COMPARISON:  None FINDINGS: Marked widening of the medial joint line on AP view, appears reduced on oblique view, consistent with ligamentous disruption. Bones appear slightly demineralized. No definite fracture or gross dislocation seen. Large Achilles insertion calcaneal spur. IMPRESSION: Marked widening of the medial ankle joint on AP view reduced on oblique and lateral views, consistent with medial ligamentous injury and subluxation. No acute fracture or dislocation identified. Electronically Signed   By: Lavonia Dana M.D.   On: 02/15/2016 15:39   Dg Knee Complete 4 Views Left  Result Date: 02/15/2016 CLINICAL DATA:  Stepped out of his truck and rolled his ankle earlier today, pain throughout ankle and proximal LEFT foot radiating proximally throughout LEFT lower leg and LEFT knee, initial encounter EXAM: LEFT KNEE - COMPLETE 4+ VIEW COMPARISON:  LEFT knee radiographs 04/03/2004 FINDINGS: Osseous demineralization. Medial compartment joint space narrowing. Oblique nondisplaced fracture of proximal LEFT fibular diaphysis. No additional fracture, dislocation or bone destruction. No knee joint effusion. Patellar spur at quadriceps tendon insertion. Atherosclerotic calcification aorta. IMPRESSION: Oblique nondisplaced  fracture of proximal LEFT fibular diaphysis. Electronically Signed   By: Lavonia Dana M.D.   On: 02/15/2016 15:42   Dg Foot Complete Left  Result Date: 02/15/2016 CLINICAL DATA:  Stepped out of his truck and rolled his ankle earlier today, pain throughout ankle and proximal LEFT foot radiating proximally throughout LEFT lower leg and LEFT knee, initial encounter EXAM: LEFT FOOT - COMPLETE 3+ VIEW COMPARISON:  None FINDINGS: Osseous demineralization. Joint spaces preserved. No acute fracture, dislocation, or bone destruction. Soft tissue swelling at ankle. Large Achilles insertion calcaneal spur. Scattered small  vessel vascular calcifications at ankle. IMPRESSION: No acute osseous abnormalities. Electronically Signed   By: Lavonia Dana M.D.   On: 02/15/2016 15:35    Procedures Procedures (including critical care time)  Medications Ordered in ED Medications - No data to display   Initial Impression / Assessment and Plan / ED Course  I have reviewed the triage vital signs and the nursing notes.  Pertinent labs & imaging results that were available during my care of the patient were reviewed by me and considered in my medical decision making (see chart for details).  Clinical Course   Patient arrived back here by Nathan Proctor. Stating that he had arrangements to go to rehabilitation. Payment evident that the patient probably has some dementia. Story started to change. He was seen here yesterday following an injury to his left ankle fracture had his splint in place. Supposed to follow-up with Dr. Reynaldo Minium from orthopedics. Patient was evaluated by social worker yesterday. The home health things were put in place. Supposed to home with wife it may be girlfriend. She apparently called and had him sent back in. Patient then thought he was supposed to go to a group home. Contacted the care management and Education officer, museum. They were planning on discussing this with his wife or girlfriend but patient then left AMA with 2 friends that showed up.  Patient's splint was in place Refill to the toes were less than 2 seconds. No obvious distress.  Final Clinical Impressions(s) / ED Diagnoses   Final diagnoses:  Closed fracture of left ankle with routine healing, subsequent encounter    New Prescriptions New Prescriptions   No medications on file     Fredia Sorrow, MD 02/16/16 1845

## 2016-02-16 NOTE — Telephone Encounter (Signed)
Pt was discharged 10/18 from [facility] with home health [facility]).  Pt states he has been in same position (flat on his back) since he was taken home by [facility] last night.  He states he was under the impression that he would have orthopedic surgery within days and on his way to recovery.  He called orthopedic office to find that they can not see him in office until 11/2.  Pt wants to be placed in SNF until surgery or admitted to hospital.  [facility] advised him that he WOULD NOT be admitted for orthopedic surgery unless it was an emergency situation. Pt states his wife can not help him because of his size. EDCM advised pt to let me call [facility] agency to find out when he would be receiving services.  EDCM contacted [place] who states they have a therapist on schedule to see pt today.  Karolee Stamps states she will contact pt to hear his concerns.  Will continue to follow pt progression.

## 2016-02-16 NOTE — ED Notes (Signed)
PT visitor place PT in wheelchair when asking PT if he was going to restroom PT state "nope I am going home". PT exit out of ED. RN have been made aware

## 2016-02-16 NOTE — ED Triage Notes (Signed)
Per EMS- Patient had a fall yesterday and now has a fracture of the left ankle. Patient was discharged home and Home health was suppose to start today., but patient was told to come to the ED by his physician for rehab placement.

## 2016-02-16 NOTE — ED Notes (Signed)
Pt keeps requesting

## 2016-02-16 NOTE — Progress Notes (Signed)
ED CM spoke with [facility] ED CM again Pt is at [facility] ED   CM contacted [facility] ED SW

## 2016-02-16 NOTE — ED Notes (Signed)
Patient yelling out stating that he wants to leave.

## 2016-02-17 ENCOUNTER — Encounter: Payer: Self-pay | Admitting: *Deleted

## 2016-02-17 ENCOUNTER — Telehealth: Payer: Self-pay | Admitting: *Deleted

## 2016-02-20 ENCOUNTER — Encounter: Payer: Self-pay | Admitting: Adult Health

## 2016-02-20 ENCOUNTER — Non-Acute Institutional Stay (SKILLED_NURSING_FACILITY): Payer: Medicare HMO | Admitting: Adult Health

## 2016-02-20 DIAGNOSIS — D638 Anemia in other chronic diseases classified elsewhere: Secondary | ICD-10-CM

## 2016-02-20 DIAGNOSIS — K5909 Other constipation: Secondary | ICD-10-CM

## 2016-02-20 DIAGNOSIS — S82892S Other fracture of left lower leg, sequela: Secondary | ICD-10-CM

## 2016-02-20 DIAGNOSIS — I10 Essential (primary) hypertension: Secondary | ICD-10-CM | POA: Diagnosis not present

## 2016-02-20 DIAGNOSIS — N183 Chronic kidney disease, stage 3 unspecified: Secondary | ICD-10-CM

## 2016-02-20 DIAGNOSIS — E1122 Type 2 diabetes mellitus with diabetic chronic kidney disease: Secondary | ICD-10-CM

## 2016-02-20 DIAGNOSIS — M109 Gout, unspecified: Secondary | ICD-10-CM

## 2016-02-20 DIAGNOSIS — S82832S Other fracture of upper and lower end of left fibula, sequela: Secondary | ICD-10-CM

## 2016-02-20 DIAGNOSIS — R2681 Unsteadiness on feet: Secondary | ICD-10-CM

## 2016-02-20 DIAGNOSIS — G629 Polyneuropathy, unspecified: Secondary | ICD-10-CM

## 2016-02-20 NOTE — Progress Notes (Signed)
Patient ID: Nathan Proctor., male   DOB: 03/22/1941, 75 y.o.   MRN: [id number]    DATE:  02/20/2016   MRN:  [id number]  BIRTHDAY: 02-12-1941  Facility:  Nursing Home Location:  [place] Room Number: 903-A  LEVEL OF CARE:  SNF [phone])  Contact Information    Name Relation Home Work [place] [phone]  [phone]       Code Status History    This patient does not have a recorded code status. Please follow your organizational policy for patients in this situation.       Chief Complaint  Patient presents with  . Hospitalization Follow-up    HISTORY OF PRESENT ILLNESS:  This is a 75 year old male who has PMH of  Diabetes Mellitus. He has been admitted to [facility] on 02/17/16. He fell getting from his truck and sustained a closed fracture of left ankle and closed fracture of proximal end of left fibula. He was admitted from [facility]. He is morbidly obese and spouse is not able to take care of him at home. He will follow-up with [facility].   PAST MEDICAL HISTORY:  Past Medical History:  Diagnosis Date  . Ankle fracture 02/15/2016  . Cancer [facility])    Prostate  . Diabetes mellitus without complication ([place])   . Morbid obesity ([place])      CURRENT MEDICATIONS: Reviewed  Patient's Medications  New Prescriptions   No medications on file  Previous Medications   ACETAMINOPHEN (TYLENOL) 500 MG TABLET    Take 2 tablets (1,000 mg total) by mouth every 8 (eight) hours. Do not take more than 4000 mg of acetaminophen (Tylenol) in a 24-hour period. Please note that other medicines that you may be prescribed may have Tylenol as well.   ALLOPURINOL (ZYLOPRIM) 100 MG TABLET    Take 100 mg by mouth daily.    AMLODIPINE (NORVASC) 10 MG TABLET    Take 10 mg by mouth daily.    ATORVASTATIN (LIPITOR) 40 MG TABLET    Take 40 mg by mouth daily.    CARVEDILOL (COREG) 25 MG TABLET    Take 25 mg  by mouth 2 (two) times daily with a meal.    GABAPENTIN (NEURONTIN) 300 MG CAPSULE    Take 300 mg by mouth daily.   HYDROCODONE-ACETAMINOPHEN (NORCO/VICODIN) 5-325 MG TABLET    Take 1 tablet by mouth every 8 (eight) hours as needed for severe pain (That is not improved by your scheduled acetaminophen regimen). Please do not exceed 4000 mg of acetaminophen (Tylenol) a 24-hour period. Please note that he may be prescribed additional medicine that contains acetaminophen.   INSULIN ASPART PROTAMINE- ASPART (NOVOLOG MIX 70/30) (70-30) 100 UNIT/ML INJECTION    Inject into the skin.   INSULIN LISPRO PROTAMINE-LISPRO (HUMALOG 75/25 MIX) (75-25) 100 UNIT/ML SUSP INJECTION    Inject 60 Units into the skin daily with breakfast.   LUBIPROSTONE (AMITIZA) 24 MCG CAPSULE    Take 24 mcg by mouth 2 (two) times daily with a meal.    METFORMIN (GLUCOPHAGE-XR) 750 MG 24 HR TABLET    Take 750 mg by mouth daily.    MULTIPLE VITAMIN (MULTIVITAMIN WITH MINERALS) TABS TABLET    Take 1 tablet by mouth daily.   VITAMIN D, ERGOCALCIFEROL, (DRISDOL) 50000 UNITS CAPS CAPSULE    Take 50,000 Units by mouth 2 (two) times a week.  Modified Medications   No medications on  file  Discontinued Medications   No medications on file     No Known Allergies   REVIEW OF SYSTEMS:  GENERAL: no change in appetite, no fatigue, no weight changes, no fever, chills or weakness EYES: Denies change in vision, dry eyes, eye pain, itching or discharge EARS: Denies change in hearing, ringing in ears, or earache NOSE: Denies nasal congestion or epistaxis MOUTH and THROAT: Denies oral discomfort, gingival pain or bleeding, pain from teeth or hoarseness   RESPIRATORY: no cough, SOB, DOE, wheezing, hemoptysis CARDIAC: no chest pain, edema or palpitations GI: no abdominal pain, diarrhea, constipation, heart burn, nausea or vomiting GU: Denies dysuria, frequency, hematuria, incontinence, or discharge PSYCHIATRIC: Denies feeling of depression or  anxiety. No report of hallucinations, insomnia, paranoia, or agitation   PHYSICAL EXAMINATION  GENERAL APPEARANCE: Well nourished. In no acute distress. Morbidly obese SKIN:  Left leg with splint and ace wrap HEAD: Normal in size and contour. No evidence of trauma EYES: Lids open and close normally. No blepharitis, entropion or ectropion. PERRL. Conjunctivae are clear and sclerae are white. Lenses are without opacity EARS: Pinnae are normal. Patient hears normal voice tunes of the examiner MOUTH and THROAT: Lips are without lesions. Oral mucosa is moist and without lesions. Tongue is normal in shape, size, and color and without lesions NECK: supple, trachea midline, no neck masses, no thyroid tenderness, no thyromegaly LYMPHATICS: no LAN in the neck, no supraclavicular LAN RESPIRATORY: breathing is even & unlabored, BS CTAB CARDIAC: RRR, no murmur,no extra heart sounds, no edema GI: abdomen soft, normal BS, no masses, no tenderness, no hepatomegaly, no splenomegaly EXTREMITIES:  Able to move X 4 extremities; left lower leg/foot covered with splint and ACE wrap; able to wiggle toes PSYCHIATRIC: Alert and oriented X 3. Affect and behavior are appropriate  LABS/RADIOLOGY: Labs reviewed: Basic Metabolic Panel:  Recent Labs  02/15/16 1541  NA 144  K 4.2  CL 108  CO2 26  GLUCOSE 58*  BUN 25*  CREATININE 1.56*  CALCIUM 10.0   CBC:  Recent Labs  02/15/16 1541  WBC 12.0*  HGB 10.9*  HCT 35.3*  MCV 96.7  PLT 233    CBG:  Recent Labs  02/15/16 2002 02/15/16 2108 02/15/16 2150  GLUCAP 162* 189* 213*      Dg Tibia/fibula Left  Result Date: 02/15/2016 CLINICAL DATA:  Stepped out of his truck and rolled his ankle earlier today, pain throughout ankle and proximal LEFT foot radiating proximally throughout LEFT lower leg and LEFT knee, initial encounter EXAM: LEFT TIBIA AND FIBULA - 2 VIEW COMPARISON:  04/03/2004 FINDINGS: Osseous demineralization. Joint spaces preserved.  Oblique nondisplaced fracture proximal fibular diaphysis. No additional fracture, dislocation, or bone destruction. Increased atherosclerotic calcifications since previous exam. Soft tissue swelling at ankle. IMPRESSION: Nondisplaced oblique proximal LEFT fibular diaphyseal fracture. Electronically Signed   By: Lavonia Dana M.D.   On: 02/15/2016 15:40   Dg Ankle Complete Left  Result Date: 02/15/2016 CLINICAL DATA:  Stepped out of his truck and rolled his ankle earlier today, pain throughout ankle and proximal LEFT foot radiating proximally throughout LEFT lower leg and LEFT knee, initial encounter EXAM: LEFT ANKLE COMPLETE - 3+ VIEW COMPARISON:  None FINDINGS: Marked widening of the medial joint line on AP view, appears reduced on oblique view, consistent with ligamentous disruption. Bones appear slightly demineralized. No definite fracture or gross dislocation seen. Large Achilles insertion calcaneal spur. IMPRESSION: Marked widening of the medial ankle joint on AP view reduced on oblique and  lateral views, consistent with medial ligamentous injury and subluxation. No acute fracture or dislocation identified. Electronically Signed   By: Lavonia Dana M.D.   On: 02/15/2016 15:39   Dg Knee Complete 4 Views Left  Result Date: 02/15/2016 CLINICAL DATA:  Stepped out of his truck and rolled his ankle earlier today, pain throughout ankle and proximal LEFT foot radiating proximally throughout LEFT lower leg and LEFT knee, initial encounter EXAM: LEFT KNEE - COMPLETE 4+ VIEW COMPARISON:  LEFT knee radiographs 04/03/2004 FINDINGS: Osseous demineralization. Medial compartment joint space narrowing. Oblique nondisplaced fracture of proximal LEFT fibular diaphysis. No additional fracture, dislocation or bone destruction. No knee joint effusion. Patellar spur at quadriceps tendon insertion. Atherosclerotic calcification aorta. IMPRESSION: Oblique nondisplaced fracture of proximal LEFT fibular diaphysis. Electronically  Signed   By: Lavonia Dana M.D.   On: 02/15/2016 15:42   Dg Foot Complete Left  Result Date: 02/15/2016 CLINICAL DATA:  Stepped out of his truck and rolled his ankle earlier today, pain throughout ankle and proximal LEFT foot radiating proximally throughout LEFT lower leg and LEFT knee, initial encounter EXAM: LEFT FOOT - COMPLETE 3+ VIEW COMPARISON:  None FINDINGS: Osseous demineralization. Joint spaces preserved. No acute fracture, dislocation, or bone destruction. Soft tissue swelling at ankle. Large Achilles insertion calcaneal spur. Scattered small vessel vascular calcifications at ankle. IMPRESSION: No acute osseous abnormalities. Electronically Signed   By: Lavonia Dana M.D.   On: 02/15/2016 15:35    ASSESSMENT/PLAN:  Unsteady gait - for rehabilitation, PT and OT, for therapeutic strengthening exercises; fall precaution  Closed fracture of left ankle and closed fracture of proximal end of left fibula - will follow-up with [facility] tomorrow, 02/21/16; continue Norco 5/325 mg 1 tab PO Q 8 hours and Tylenol 500 mg give 2 capsules = 1,000 mg PO Q 8 PRN for pain  Gout - continue Allopurinol 100 mg 1 tab PO Q D  Hypertension - continue Norvasc 10 mg 1 tab PO Q D, Coreg 25 mg 1 tab PO BID  Hyperlipidemia - continue Lipitor 40 mg 1 tab PO Q D  Neuropathy - continue Neurontin 300 mg PO Q D  Diabetes mellitus, type 2 - continue Humalog Mix 75-25 kwikpen 60 units SQ Q AM and Metformin 750 mg 24 hr 1 tab PO BID; check hgbA1c; CBG Q D  Chronic constipation - continue Amitiza 24 mcg 1 capsule PO BID with meals  Chronic kidney disease, stage 3 - GFR 48 ; will monitor; check CMP Lab Results  Component Value Date   CREATININE 1.56 (H) 02/15/2016   Anemia of chronic disease - will monitor; check CBC Lab Results  Component Value Date   HGB 10.9 (L) 02/15/2016       Goals of care:  Short-term rehabilitation     Durenda Age, NP [facility] [phone]

## 2016-02-20 NOTE — Progress Notes (Deleted)
Patient ID: Nathan Proctor., male   DOB: [birth year], 75 y.o.   MRN: [id number]

## 2016-02-21 ENCOUNTER — Non-Acute Institutional Stay (SKILLED_NURSING_FACILITY): Payer: Medicare HMO | Admitting: Internal Medicine

## 2016-02-21 ENCOUNTER — Encounter: Payer: Self-pay | Admitting: Internal Medicine

## 2016-02-21 DIAGNOSIS — S82832S Other fracture of upper and lower end of left fibula, sequela: Secondary | ICD-10-CM

## 2016-02-21 DIAGNOSIS — S82892S Other fracture of left lower leg, sequela: Secondary | ICD-10-CM | POA: Diagnosis not present

## 2016-02-21 DIAGNOSIS — D638 Anemia in other chronic diseases classified elsewhere: Secondary | ICD-10-CM

## 2016-02-21 DIAGNOSIS — R2681 Unsteadiness on feet: Secondary | ICD-10-CM

## 2016-02-21 DIAGNOSIS — E785 Hyperlipidemia, unspecified: Secondary | ICD-10-CM | POA: Diagnosis not present

## 2016-02-21 DIAGNOSIS — N183 Chronic kidney disease, stage 3 unspecified: Secondary | ICD-10-CM

## 2016-02-21 DIAGNOSIS — K5909 Other constipation: Secondary | ICD-10-CM | POA: Diagnosis not present

## 2016-02-21 DIAGNOSIS — E1142 Type 2 diabetes mellitus with diabetic polyneuropathy: Secondary | ICD-10-CM | POA: Diagnosis not present

## 2016-02-21 DIAGNOSIS — I10 Essential (primary) hypertension: Secondary | ICD-10-CM

## 2016-02-21 LAB — HEPATIC FUNCTION PANEL
ALK PHOS: 76 U/L (ref 25–125)
ALT: 11 U/L (ref 10–40)
AST: 12 U/L — AB (ref 14–40)
Bilirubin, Total: 0.5 mg/dL

## 2016-02-21 LAB — BASIC METABOLIC PANEL
BUN: 18 mg/dL (ref 4–21)
CREATININE: 1.4 mg/dL — AB (ref 0.6–1.3)
Glucose: 142 mg/dL
POTASSIUM: 4.4 mmol/L (ref 3.4–5.3)
SODIUM: 146 mmol/L (ref 137–147)

## 2016-02-21 LAB — HEMOGLOBIN A1C: HEMOGLOBIN A1C: 6.5

## 2016-02-21 LAB — CBC AND DIFFERENTIAL
HCT: 32 % — AB (ref 41–53)
HEMOGLOBIN: 9.8 g/dL — AB (ref 13.5–17.5)
Neutrophils Absolute: 5 /uL
PLATELETS: 254 10*3/uL (ref 150–399)
WBC: 7.5 10^3/mL

## 2016-02-21 NOTE — Progress Notes (Signed)
LOCATION: [place]  PCP: Nathan Spurling, MD   Code Status: Full Code  Goals of care: Advanced Directive information Advanced Directives 02/16/2016  Does patient have an advance directive? No  Would patient like information on creating an advanced directive? No - patient declined information       Extended Emergency Contact Information Primary Emergency Contact: Nathan Proctor Address: [address]          [place], [place] [address] Nathan Proctor of [place] Phone: [phone] Mobile Phone: [phone] Relation: Spouse   No Known Allergies  Chief Complaint  Patient presents with  . New Admit To SNF    New Admission Visit     HPI:  Patient is a 75 y.o. male seen today for short term rehabilitation post ED visit on 02/16/16 with left ankle closed fracture and fracture of left proximal end of fibula. He had not had surgery. He is to be seen by [facility] today. He has morbid obesity and DM among others. He is seen in his room today.   Review of Systems:  Constitutional: Negative for fever, chills.  Feels weak and tired. HENT: Negative for headache, congestion, nasal discharge, hearing loss, sore throat, difficulty swallowing.   Eyes: Negative for blurred vision, discharge.  Respiratory: Negative for cough, shortness of breath and wheezing.   Cardiovascular: Negative for chest pain, palpitations, leg swelling.  Gastrointestinal: Negative for heartburn, nausea, vomiting, abdominal pain. Last bowel movement was 2 days ago.  Genitourinary: Negative for dysuria and flank pain.  Musculoskeletal: Negative for back pain, fall in the facility.  has left leg pain. Pain medication has been helpful.  Skin: Negative for itching, rash.  Neurological: Negative for dizziness. Psychiatric/Behavioral: Negative for depression   Past Medical History:  Diagnosis Date  . Ankle fracture 02/15/2016  . Cancer [facility])    Prostate  . Diabetes mellitus without  complication ([place])   . Morbid obesity ([place])    Past Surgical History:  Procedure Laterality Date  . PROSTATE SURGERY     Social History:   reports that he has never smoked. He has never used smokeless tobacco. He reports that he does not drink alcohol or use drugs.  No family history on file.  Medications:   Medication List       Accurate as of 02/21/16 12:11 PM. Always use your most recent med list.          acetaminophen 500 MG tablet Commonly known as:  TYLENOL Take 1,000 mg by mouth every 8 (eight) hours as needed for mild pain.   allopurinol 100 MG tablet Commonly known as:  ZYLOPRIM Take 100 mg by mouth daily.   amLODipine 10 MG tablet Commonly known as:  NORVASC Take 10 mg by mouth daily.   atorvastatin 40 MG tablet Commonly known as:  LIPITOR Take 40 mg by mouth daily.   carvedilol 25 MG tablet Commonly known as:  COREG Take 25 mg by mouth 2 (two) times daily with a meal.   gabapentin 300 MG capsule Commonly known as:  NEURONTIN Take 300 mg by mouth daily.   HYDROcodone-acetaminophen 5-325 MG tablet Commonly known as:  NORCO/VICODIN Take 1 tablet by mouth every 8 (eight) hours as needed for moderate pain.   insulin lispro protamine-lispro (75-25) 100 UNIT/ML Susp injection Commonly known as:  HUMALOG 75/25 MIX Inject 60 Units into the skin daily with breakfast.   lubiprostone 24 MCG capsule Commonly known as:  AMITIZA Take 24 mcg by mouth 2 (  two) times daily with a meal.   metFORMIN 750 MG 24 hr tablet Commonly known as:  GLUCOPHAGE-XR Take 750 mg by mouth daily.   multivitamin with minerals Tabs tablet Take 1 tablet by mouth daily.   Vitamin D (Ergocalciferol) 50000 units Caps capsule Commonly known as:  DRISDOL Take 50,000 Units by mouth 2 (two) times a week.       Immunizations:  There is no immunization history on file for this patient.   Physical Exam:  Vitals:   02/21/16 1207  BP: (!) 162/83  Pulse: 85  Resp: 18  Temp:  98 F (36.7 C)  TempSrc: Oral  SpO2: 97%  Weight: (!) 350 lb (158.8 kg)  Height: 5\' 10"  (1.778 m)   Body mass index is 50.22 kg/m.  General- elderly morbidly obese male, in no acute distress Head- normocephalic, atraumatic Throat- moist mucus membrane Eyes- PERRLA, EOMI, no pallor, no icterus Neck- no cervical lymphadenopathy Cardiovascular- normal s1,s2, no murmur, trace leg edema Respiratory- bilateral clear to auscultation, no wheeze, no rhonchi, no crackles, no use of accessory muscles Abdomen- bowel sounds present, soft, non tender Musculoskeletal- able to move all 4 extremities, left leg with ace wrap, able to move his toes and has good capillary refill, splint present to LLE Neurological- alert and oriented to person, place and time Skin- warm and dry Psychiatry- normal mood and affect    Labs reviewed: Basic Metabolic Panel:  Recent Labs  02/15/16 1541  NA 144  K 4.2  CL 108  CO2 26  GLUCOSE 58*  BUN 25*  CREATININE 1.56*  CALCIUM 10.0   Liver Function Tests: No results for input(s): AST, ALT, ALKPHOS, BILITOT, PROT, ALBUMIN in the last 8760 hours. No results for input(s): LIPASE, AMYLASE in the last 8760 hours. No results for input(s): AMMONIA in the last 8760 hours. CBC:  Recent Labs  02/15/16 1541  WBC 12.0*  HGB 10.9*  HCT 35.3*  MCV 96.7  PLT 233   Cardiac Enzymes: No results for input(s): CKTOTAL, CKMB, CKMBINDEX, TROPONINI in the last 8760 hours. BNP: Invalid input(s): POCBNP CBG:  Recent Labs  02/15/16 2002 02/15/16 2108 02/15/16 2150  GLUCAP 162* 189* 213*    Radiological Exams: Dg Tibia/fibula Left  Result Date: 02/15/2016 CLINICAL DATA:  Stepped out of his truck and rolled his ankle earlier today, pain throughout ankle and proximal LEFT foot radiating proximally throughout LEFT lower leg and LEFT knee, initial encounter EXAM: LEFT TIBIA AND FIBULA - 2 VIEW COMPARISON:  04/03/2004 FINDINGS: Osseous demineralization. Joint  spaces preserved. Oblique nondisplaced fracture proximal fibular diaphysis. No additional fracture, dislocation, or bone destruction. Increased atherosclerotic calcifications since previous exam. Soft tissue swelling at ankle. IMPRESSION: Nondisplaced oblique proximal LEFT fibular diaphyseal fracture. Electronically Signed   By: Lavonia Dana M.D.   On: 02/15/2016 15:40   Dg Ankle Complete Left  Result Date: 02/15/2016 CLINICAL DATA:  Stepped out of his truck and rolled his ankle earlier today, pain throughout ankle and proximal LEFT foot radiating proximally throughout LEFT lower leg and LEFT knee, initial encounter EXAM: LEFT ANKLE COMPLETE - 3+ VIEW COMPARISON:  None FINDINGS: Marked widening of the medial joint line on AP view, appears reduced on oblique view, consistent with ligamentous disruption. Bones appear slightly demineralized. No definite fracture or gross dislocation seen. Large Achilles insertion calcaneal spur. IMPRESSION: Marked widening of the medial ankle joint on AP view reduced on oblique and lateral views, consistent with medial ligamentous injury and subluxation. No acute fracture or dislocation  identified. Electronically Signed   By: Lavonia Dana M.D.   On: 02/15/2016 15:39   Dg Knee Complete 4 Views Left  Result Date: 02/15/2016 CLINICAL DATA:  Stepped out of his truck and rolled his ankle earlier today, pain throughout ankle and proximal LEFT foot radiating proximally throughout LEFT lower leg and LEFT knee, initial encounter EXAM: LEFT KNEE - COMPLETE 4+ VIEW COMPARISON:  LEFT knee radiographs 04/03/2004 FINDINGS: Osseous demineralization. Medial compartment joint space narrowing. Oblique nondisplaced fracture of proximal LEFT fibular diaphysis. No additional fracture, dislocation or bone destruction. No knee joint effusion. Patellar spur at quadriceps tendon insertion. Atherosclerotic calcification aorta. IMPRESSION: Oblique nondisplaced fracture of proximal LEFT fibular diaphysis.  Electronically Signed   By: Lavonia Dana M.D.   On: 02/15/2016 15:42   Dg Foot Complete Left  Result Date: 02/15/2016 CLINICAL DATA:  Stepped out of his truck and rolled his ankle earlier today, pain throughout ankle and proximal LEFT foot radiating proximally throughout LEFT lower leg and LEFT knee, initial encounter EXAM: LEFT FOOT - COMPLETE 3+ VIEW COMPARISON:  None FINDINGS: Osseous demineralization. Joint spaces preserved. No acute fracture, dislocation, or bone destruction. Soft tissue swelling at ankle. Large Achilles insertion calcaneal spur. Scattered small vessel vascular calcifications at ankle. IMPRESSION: No acute osseous abnormalities. Electronically Signed   By: Lavonia Dana M.D.   On: 02/15/2016 15:35    Assessment/Plan  Unsteady gait Will have him work with physical therapy and occupational therapy team to help with gait training and muscle strengthening exercises.fall precautions. Skin care. Encourage to be out of bed.   Closed fracture of left ankle Has splint in place and is NWB to LLE at present. To follow with orthopedic service today. Continue norco 5-325 mg q8h prn pain.  Closed fracture of left proximal fibula Splint to be in place, NWB to LLE. Has orthopedic follow up. Will have patient work with PT/OT as tolerated to regain strength and restore function.  Fall precautions are in place.  Hypertension Monitor BP reading. Continue norvasc and coreg for now. Check bmp  Dm type 2 with neuropathy No results found for: HGBA1C  monitor cbg. Continue metformin and humalog mix 75/25 60 u in am. Check a1c. Continue neurontin for neuropathic pain  Hyperlipidemia continue lipitor   Chronic constipation Continue amitiza for now  Anemia of chronic disease Monitor cbc  ckd stage 3 Monitor his BMP    Goals of care: short term rehabilitation   Labs/tests ordered: cbc, cmp, a1c   Family/ staff Communication: reviewed care plan with patient and nursing  supervisor    Blanchie Serve, MD Internal Medicine [place], [place] [address] Cell Phone (Monday-Friday 8 am - 5 pm): [phone] On Call: [phone] and follow prompts after 5 pm and on weekends Office Phone: [phone] Office Fax: [phone]

## 2016-02-21 NOTE — Progress Notes (Signed)
02/21/2016 2001pm A. Weir Reviewed chart for follow up.  Per chart review, patient now at [facility].  No further needs at this time.

## 2016-02-23 ENCOUNTER — Non-Acute Institutional Stay (SKILLED_NURSING_FACILITY): Payer: Medicare HMO | Admitting: Adult Health

## 2016-02-23 ENCOUNTER — Encounter: Payer: Self-pay | Admitting: Adult Health

## 2016-02-23 ENCOUNTER — Encounter (HOSPITAL_COMMUNITY): Payer: Self-pay | Admitting: *Deleted

## 2016-02-23 DIAGNOSIS — E1142 Type 2 diabetes mellitus with diabetic polyneuropathy: Secondary | ICD-10-CM | POA: Diagnosis not present

## 2016-02-23 DIAGNOSIS — N183 Chronic kidney disease, stage 3 unspecified: Secondary | ICD-10-CM

## 2016-02-23 DIAGNOSIS — D638 Anemia in other chronic diseases classified elsewhere: Secondary | ICD-10-CM

## 2016-02-23 NOTE — Progress Notes (Signed)
Patient ID: [name]., male   DOB: 01/15/1941, 75 y.o.   MRN: [id number]    DATE:   02/23/16  MRN:  [id number]  BIRTHDAY: 01/14/1941  Facility:  Nursing Home Location:  [place] Room Number: 903-A  LEVEL OF CARE:  SNF (31)  Contact Information    Name Relation Home Work [place] [phone]  [phone]       Code Status History    This patient does not have a recorded code status. Please follow your organizational policy for patients in this situation.       Chief Complaint  Patient presents with  . Acute Visit    Diabetes management    HISTORY OF PRESENT ILLNESS:  This is a 75 year old male who has CBGs - 159, 99, 66, 94, 142, 173 and 178. Charge nurse reported that his CBG this morning was 102 so 60 units of Humalog 75/25 insulin was held. He also takes Glucophage XR 750 mg 1 tab PO BID. Latest creatinine is 1.40 and GFR >60. He was seen in his room today with his wife @ bedside.  He has PMH of  Diabetes Mellitus. He has been admitted to [facility] on 02/17/16. He fell getting from his truck and sustained a closed fracture of left ankle and closed fracture of proximal end of left fibula. He was admitted from [facility]. He is morbidly obese and spouse is not able to take care of him at home. He will follow-up with [facility].   PAST MEDICAL HISTORY:  Past Medical History:  Diagnosis Date  . Anemia of chronic disease   . Ankle fracture 02/15/2016  . Cancer [facility])    Prostate  . Chronic constipation   . Chronic kidney disease    stage III  . Diabetes mellitus without complication ([place])    type II   . Diabetic peripheral neuropathy ([place])   . Failure to thrive (0-17)   . Fracture of left lower leg   . Gout   . Hyperlipidemia   . Hypertension   . Morbid obesity ([place])   . Unstable gait      CURRENT MEDICATIONS: Reviewed  Patient's Medications  New  Prescriptions   No medications on file  Previous Medications   ACETAMINOPHEN (TYLENOL) 500 MG TABLET    Take 1,000 mg by mouth every 8 (eight) hours as needed for mild pain.   ALLOPURINOL (ZYLOPRIM) 100 MG TABLET    Take 100 mg by mouth daily.    AMLODIPINE (NORVASC) 10 MG TABLET    Take 10 mg by mouth daily.    ATORVASTATIN (LIPITOR) 40 MG TABLET    Take 40 mg by mouth daily.    CARVEDILOL (COREG) 25 MG TABLET    Take 25 mg by mouth 2 (two) times daily with a meal.    ENOXAPARIN (LOVENOX) 40 MG/0.4ML INJECTION    Inject 40 mg into the skin daily with breakfast. At 8am per MAR.   GABAPENTIN (NEURONTIN) 300 MG CAPSULE    Take 300 mg by mouth daily.   HYDROCODONE-ACETAMINOPHEN (NORCO/VICODIN) 5-325 MG TABLET    Take 1 tablet by mouth every 8 (eight) hours as needed for moderate pain.   INSULIN LISPRO PROTAMINE-LISPRO (HUMALOG 75/25 MIX) (75-25) 100 UNIT/ML SUSP INJECTION    Inject 60 Units into the skin daily with breakfast.   LUBIPROSTONE (AMITIZA) 24 MCG CAPSULE    Take 24 mcg by mouth  2 (two) times daily with a meal.    MULTIPLE VITAMIN (MULTIVITAMIN WITH MINERALS) TABS TABLET    Take 1 tablet by mouth daily.   VITAMIN D, ERGOCALCIFEROL, (DRISDOL) 50000 UNITS CAPS CAPSULE    Take 50,000 Units by mouth 2 (two) times a week. On Wed and Fri per [facility].  Modified Medications   No medications on file  Discontinued Medications   METFORMIN (GLUCOPHAGE-XR) 750 MG 24 HR TABLET    Take 750 mg by mouth 2 (two) times daily with a meal.      No Known Allergies   REVIEW OF SYSTEMS:  GENERAL: no change in appetite, no fatigue, no weight changes, no fever, chills or weakness EYES: Denies change in vision, dry eyes, eye pain, itching or discharge EARS: Denies change in hearing, ringing in ears, or earache NOSE: Denies nasal congestion or epistaxis MOUTH and THROAT: Denies oral discomfort, gingival pain or bleeding, pain from teeth or hoarseness   RESPIRATORY: no cough, SOB, DOE, wheezing,  hemoptysis CARDIAC: no chest pain, edema or palpitations GI: no abdominal pain, diarrhea, constipation, heart burn, nausea or vomiting GU: Denies dysuria, frequency, hematuria, incontinence, or discharge PSYCHIATRIC: Denies feeling of depression or anxiety. No report of hallucinations, insomnia, paranoia, or agitation   PHYSICAL EXAMINATION  GENERAL APPEARANCE: Well nourished. In no acute distress. Morbidly obese SKIN:  Left leg with splint and ace wrap HEAD: Normal in size and contour. No evidence of trauma EYES: Lids open and close normally. No blepharitis, entropion or ectropion. PERRL. Conjunctivae are clear and sclerae are white. Lenses are without opacity EARS: Pinnae are normal. Patient hears normal voice tunes of the examiner MOUTH and THROAT: Lips are without lesions. Oral mucosa is moist and without lesions. Tongue is normal in shape, size, and color and without lesions NECK: supple, trachea midline, no neck masses, no thyroid tenderness, no thyromegaly LYMPHATICS: no LAN in the neck, no supraclavicular LAN RESPIRATORY: breathing is even & unlabored, BS CTAB CARDIAC: RRR, no murmur,no extra heart sounds, no edema GI: abdomen soft, normal BS, no masses, no tenderness, no hepatomegaly, no splenomegaly EXTREMITIES:  Able to move X 4 extremities; left lower leg/foot covered with splint and ACE wrap; able to wiggle toes PSYCHIATRIC: Alert and oriented X 3. Affect and behavior are appropriate  LABS/RADIOLOGY: Labs reviewed: Basic Metabolic Panel:  Recent Labs  02/15/16 1541 02/21/16  NA 144 146  K 4.2 4.4  CL 108  --   CO2 26  --   GLUCOSE 58*  --   BUN 25* 18  CREATININE 1.56* 1.4*  CALCIUM 10.0  --    CBC:  Recent Labs  02/15/16 1541 02/21/16  WBC 12.0* 7.5  NEUTROABS  --  5  HGB 10.9* 9.8*  HCT 35.3* 32*  MCV 96.7  --   PLT 233 254    CBG:  Recent Labs  02/15/16 2002 02/15/16 2108 02/15/16 2150  GLUCAP 162* 189* 213*      Dg Tibia/fibula  Left  Result Date: 02/15/2016 CLINICAL DATA:  Stepped out of his truck and rolled his ankle earlier today, pain throughout ankle and proximal LEFT foot radiating proximally throughout LEFT lower leg and LEFT knee, initial encounter EXAM: LEFT TIBIA AND FIBULA - 2 VIEW COMPARISON:  04/03/2004 FINDINGS: Osseous demineralization. Joint spaces preserved. Oblique nondisplaced fracture proximal fibular diaphysis. No additional fracture, dislocation, or bone destruction. Increased atherosclerotic calcifications since previous exam. Soft tissue swelling at ankle. IMPRESSION: Nondisplaced oblique proximal LEFT fibular diaphyseal fracture. Electronically Signed  By: Lavonia Dana M.D.   On: 02/15/2016 15:40   Dg Ankle Complete Left  Result Date: 02/15/2016 CLINICAL DATA:  Stepped out of his truck and rolled his ankle earlier today, pain throughout ankle and proximal LEFT foot radiating proximally throughout LEFT lower leg and LEFT knee, initial encounter EXAM: LEFT ANKLE COMPLETE - 3+ VIEW COMPARISON:  None FINDINGS: Marked widening of the medial joint line on AP view, appears reduced on oblique view, consistent with ligamentous disruption. Bones appear slightly demineralized. No definite fracture or gross dislocation seen. Large Achilles insertion calcaneal spur. IMPRESSION: Marked widening of the medial ankle joint on AP view reduced on oblique and lateral views, consistent with medial ligamentous injury and subluxation. No acute fracture or dislocation identified. Electronically Signed   By: Lavonia Dana M.D.   On: 02/15/2016 15:39   Dg Knee Complete 4 Views Left  Result Date: 02/15/2016 CLINICAL DATA:  Stepped out of his truck and rolled his ankle earlier today, pain throughout ankle and proximal LEFT foot radiating proximally throughout LEFT lower leg and LEFT knee, initial encounter EXAM: LEFT KNEE - COMPLETE 4+ VIEW COMPARISON:  LEFT knee radiographs 04/03/2004 FINDINGS: Osseous demineralization. Medial  compartment joint space narrowing. Oblique nondisplaced fracture of proximal LEFT fibular diaphysis. No additional fracture, dislocation or bone destruction. No knee joint effusion. Patellar spur at quadriceps tendon insertion. Atherosclerotic calcification aorta. IMPRESSION: Oblique nondisplaced fracture of proximal LEFT fibular diaphysis. Electronically Signed   By: Lavonia Dana M.D.   On: 02/15/2016 15:42   Dg Foot Complete Left  Result Date: 02/15/2016 CLINICAL DATA:  Stepped out of his truck and rolled his ankle earlier today, pain throughout ankle and proximal LEFT foot radiating proximally throughout LEFT lower leg and LEFT knee, initial encounter EXAM: LEFT FOOT - COMPLETE 3+ VIEW COMPARISON:  None FINDINGS: Osseous demineralization. Joint spaces preserved. No acute fracture, dislocation, or bone destruction. Soft tissue swelling at ankle. Large Achilles insertion calcaneal spur. Scattered small vessel vascular calcifications at ankle. IMPRESSION: No acute osseous abnormalities. Electronically Signed   By: Lavonia Dana M.D.   On: 02/15/2016 15:35    ASSESSMENT/PLAN:  Diabetes mellitus, type 2 - continue Humalog Mix 75-25 kwikpen 60 units SQ Q AM and discontinue Metformin 750 mg 24 hr 1 tab PO BID Lab Results  Component Value Date   HGBA1C 6.5 02/21/2016   Chronic kidney disease, stage 3 - GFR> 60 ; stable Lab Results  Component Value Date   CREATININE 1.4 (A) 02/21/2016   Anemia of chronic disease - stable Lab Results  Component Value Date   HGB 9.8 (A) 02/21/2016       Durenda Age, NP [employer] [phone]

## 2016-02-24 NOTE — Progress Notes (Signed)
Called [facility] x 2 with no answer each time asking to speak to nurse who takes care of patient.  Wanted to verify some medications and times taken prior to sending preop instructions.  3rd phone call I received the voice mail of DON and left her a message that I needed to speak with nurse prior to sending preop instructions for surgery regarding medications

## 2016-02-24 NOTE — Progress Notes (Signed)
Left voice mail message for wife at [phone] with info on date, time of surgery and arrival time.  Asked for wife to call me back with any questiions and to confirm she has received message.

## 2016-02-24 NOTE — Progress Notes (Signed)
Surgery on 02/29/2016.  Need orders in EPIC.  Thank You.

## 2016-02-24 NOTE — Progress Notes (Signed)
I asked nurse , [place] at [facility] if there were any preop instructions for Lovenox from the surgeon.  She was unaware of any.  Left Nathan Proctor [facility] ) a message regarding this.

## 2016-02-24 NOTE — Progress Notes (Signed)
Spoke with Karlton Lemon , nurse at [facility] place who takes care of patient and she verified patient takes Allopurinol and Amlodipinein the am.  Patient also takes Humalog 75/25  Insulin - 60 units in am verified by nurse.  Informed nurse that I would be faxing over preop instructions  And they would need to call bac to confirm they have received preop instructions.  Nurse voiced understanding.

## 2016-02-28 MED ORDER — DEXTROSE 5 % IV SOLN
3.0000 g | INTRAVENOUS | Status: AC
  Administered 2016-02-29: 3 g via INTRAVENOUS
  Filled 2016-02-28: qty 3

## 2016-02-28 NOTE — Progress Notes (Signed)
Wife called back and inforemd her of patient's surgery time on 02/29/2016 along with arrival time.  She stated per Levada Dy at [facility] preop orders never received by fax.  Refaxed to [phone] per Angela's request at [facility] place  the preop instructions for patient.  Confirmation received.

## 2016-02-28 NOTE — Progress Notes (Signed)
Labs done 02/21/2016 are in EPIC.

## 2016-02-28 NOTE — Progress Notes (Signed)
CBC and BMP results done 02/21/16- faxed via EPIC to Dr Stann Mainland.

## 2016-02-28 NOTE — Progress Notes (Signed)
Attempted x 2 to call [facility] with no answer by nurse to confirm if preop instrtuctions had been received.

## 2016-02-28 NOTE — Progress Notes (Signed)
Left message at [phone] and  [phone] and gave ehr husband surgery time at [facility] and arrival time.  Asked wife to call me back at [phone] to know she received messages.

## 2016-02-29 ENCOUNTER — Encounter (HOSPITAL_COMMUNITY): Payer: Self-pay | Admitting: Certified Registered Nurse Anesthetist

## 2016-02-29 ENCOUNTER — Inpatient Hospital Stay (HOSPITAL_COMMUNITY): Payer: Medicare HMO

## 2016-02-29 ENCOUNTER — Inpatient Hospital Stay (HOSPITAL_COMMUNITY)
Admission: RE | Admit: 2016-02-29 | Discharge: 2016-03-02 | DRG: 493 | Disposition: A | Discharge status: Skilled Nursing Facility | Payer: Medicare HMO | Source: Ambulatory Visit | Attending: Orthopedic Surgery | Admitting: Orthopedic Surgery

## 2016-02-29 ENCOUNTER — Encounter (HOSPITAL_COMMUNITY)
Admission: RE | Disposition: A | Discharge status: Skilled Nursing Facility | Payer: Self-pay | Source: Ambulatory Visit | Attending: Orthopedic Surgery

## 2016-02-29 ENCOUNTER — Inpatient Hospital Stay (HOSPITAL_COMMUNITY): Payer: Medicare HMO | Admitting: Certified Registered Nurse Anesthetist

## 2016-02-29 DIAGNOSIS — Z6841 Body Mass Index (BMI) 40.0 and over, adult: Secondary | ICD-10-CM

## 2016-02-29 DIAGNOSIS — K5909 Other constipation: Secondary | ICD-10-CM | POA: Diagnosis present

## 2016-02-29 DIAGNOSIS — Z8546 Personal history of malignant neoplasm of prostate: Secondary | ICD-10-CM

## 2016-02-29 DIAGNOSIS — E1122 Type 2 diabetes mellitus with diabetic chronic kidney disease: Secondary | ICD-10-CM | POA: Diagnosis present

## 2016-02-29 DIAGNOSIS — D638 Anemia in other chronic diseases classified elsewhere: Secondary | ICD-10-CM | POA: Diagnosis present

## 2016-02-29 DIAGNOSIS — S82862A Displaced Maisonneuve's fracture of left leg, initial encounter for closed fracture: Secondary | ICD-10-CM | POA: Diagnosis present

## 2016-02-29 DIAGNOSIS — Z794 Long term (current) use of insulin: Secondary | ICD-10-CM | POA: Diagnosis not present

## 2016-02-29 DIAGNOSIS — I129 Hypertensive chronic kidney disease with stage 1 through stage 4 chronic kidney disease, or unspecified chronic kidney disease: Secondary | ICD-10-CM | POA: Diagnosis present

## 2016-02-29 DIAGNOSIS — W19XXXA Unspecified fall, initial encounter: Secondary | ICD-10-CM | POA: Diagnosis present

## 2016-02-29 DIAGNOSIS — Z79899 Other long term (current) drug therapy: Secondary | ICD-10-CM

## 2016-02-29 DIAGNOSIS — N183 Chronic kidney disease, stage 3 (moderate): Secondary | ICD-10-CM | POA: Diagnosis present

## 2016-02-29 DIAGNOSIS — Z419 Encounter for procedure for purposes other than remedying health state, unspecified: Secondary | ICD-10-CM

## 2016-02-29 DIAGNOSIS — S82892A Other fracture of left lower leg, initial encounter for closed fracture: Secondary | ICD-10-CM | POA: Diagnosis present

## 2016-02-29 DIAGNOSIS — S82302A Unspecified fracture of lower end of left tibia, initial encounter for closed fracture: Secondary | ICD-10-CM | POA: Diagnosis present

## 2016-02-29 HISTORY — DX: Failure to thrive (child): R62.51

## 2016-02-29 HISTORY — DX: Unsteadiness on feet: R26.81

## 2016-02-29 HISTORY — DX: Chronic kidney disease, unspecified: N18.9

## 2016-02-29 HISTORY — DX: Essential (primary) hypertension: I10

## 2016-02-29 HISTORY — PX: ORIF ANKLE FRACTURE: SHX5408

## 2016-02-29 HISTORY — DX: Unspecified fracture of left lower leg, initial encounter for closed fracture: S82.92XA

## 2016-02-29 HISTORY — DX: Other constipation: K59.09

## 2016-02-29 HISTORY — DX: Hyperlipidemia, unspecified: E78.5

## 2016-02-29 HISTORY — DX: Gout, unspecified: M10.9

## 2016-02-29 HISTORY — DX: Anemia in other chronic diseases classified elsewhere: D63.8

## 2016-02-29 HISTORY — DX: Type 2 diabetes mellitus with diabetic polyneuropathy: E11.42

## 2016-02-29 LAB — CBC
HCT: 34.9 % — ABNORMAL LOW (ref 39.0–52.0)
HCT: 34.5 % — ABNORMAL LOW (ref 39.0–52.0)
HEMOGLOBIN: 10.9 g/dL — AB (ref 13.0–17.0)
Hemoglobin: 10.8 g/dL — ABNORMAL LOW (ref 13.0–17.0)
MCH: 29.5 pg (ref 26.0–34.0)
MCH: 29.3 pg (ref 26.0–34.0)
MCHC: 31.2 g/dL (ref 30.0–36.0)
MCHC: 31.3 g/dL (ref 30.0–36.0)
MCV: 94.6 fL (ref 78.0–100.0)
MCV: 93.8 fL (ref 78.0–100.0)
PLATELETS: 252 10*3/uL (ref 150–400)
Platelets: 264 10*3/uL (ref 150–400)
RBC: 3.69 MIL/uL — ABNORMAL LOW (ref 4.22–5.81)
RBC: 3.68 MIL/uL — ABNORMAL LOW (ref 4.22–5.81)
RDW: 14.7 % (ref 11.5–15.5)
RDW: 14.6 % (ref 11.5–15.5)
WBC: 8 10*3/uL (ref 4.0–10.5)
WBC: 8.9 10*3/uL (ref 4.0–10.5)

## 2016-02-29 LAB — GLUCOSE, CAPILLARY
GLUCOSE-CAPILLARY: 103 mg/dL — AB (ref 65–99)
GLUCOSE-CAPILLARY: 134 mg/dL — AB (ref 65–99)
GLUCOSE-CAPILLARY: 155 mg/dL — AB (ref 65–99)
Glucose-Capillary: 187 mg/dL — ABNORMAL HIGH (ref 65–99)
Glucose-Capillary: 218 mg/dL — ABNORMAL HIGH (ref 65–99)

## 2016-02-29 LAB — CREATININE, SERUM
Creatinine, Ser: 1.42 mg/dL — ABNORMAL HIGH (ref 0.61–1.24)
GFR calc non Af Amer: 46 mL/min — ABNORMAL LOW (ref >60)
GFR, EST AFRICAN AMERICAN: 54 mL/min — AB (ref >60)

## 2016-02-29 IMAGING — RF DG ANKLE COMPLETE 3+V*L*
1 series · 4 of 4 positions shown · non-contrast
Comparison: [DATE]

FLUOROSCOPY TIME:  0 minutes 24 seconds; 4 submitted images

CLINICAL DATA: Open reduction internal fixation for instability

EXAM:
LEFT ANKLE COMPLETE - 3+ VIEW

[Series 1: run · 4 of 4 slices shown]
[im 1/4]
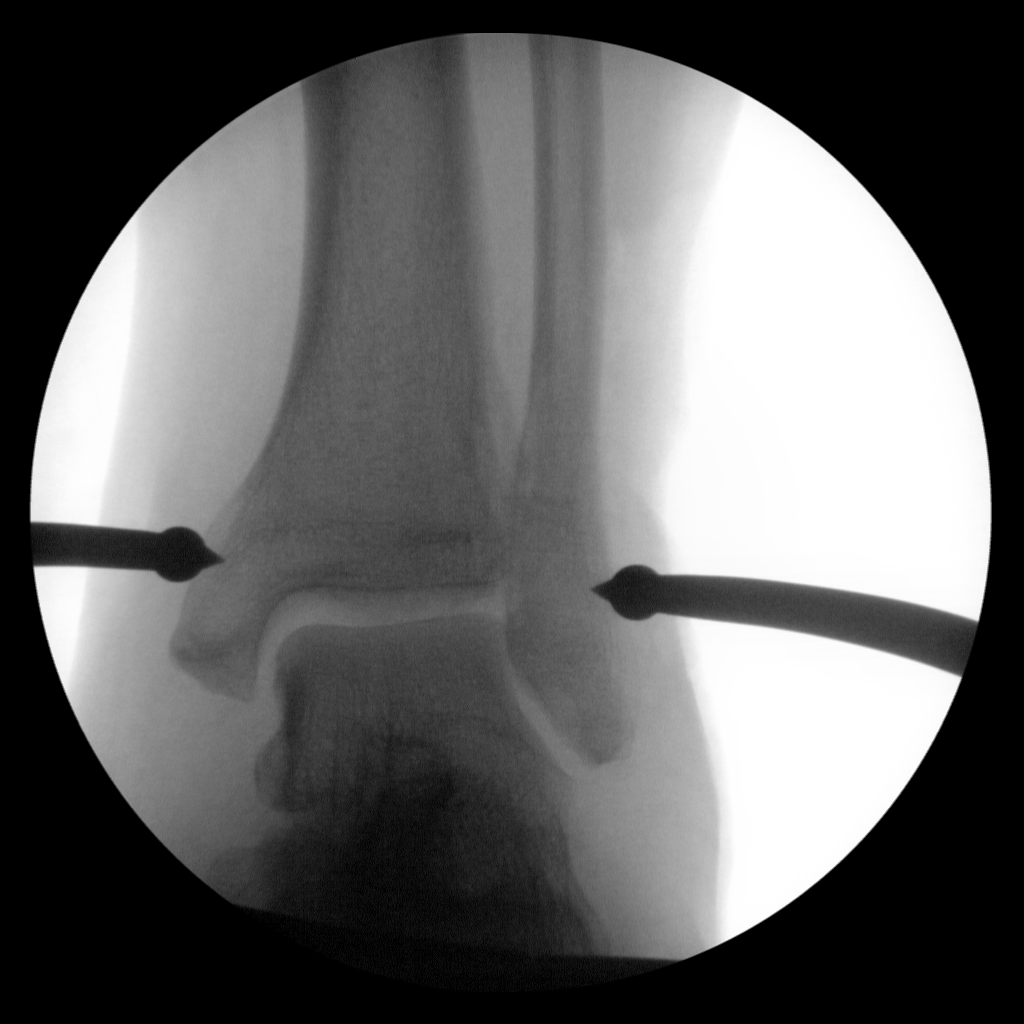
[im 2/4]
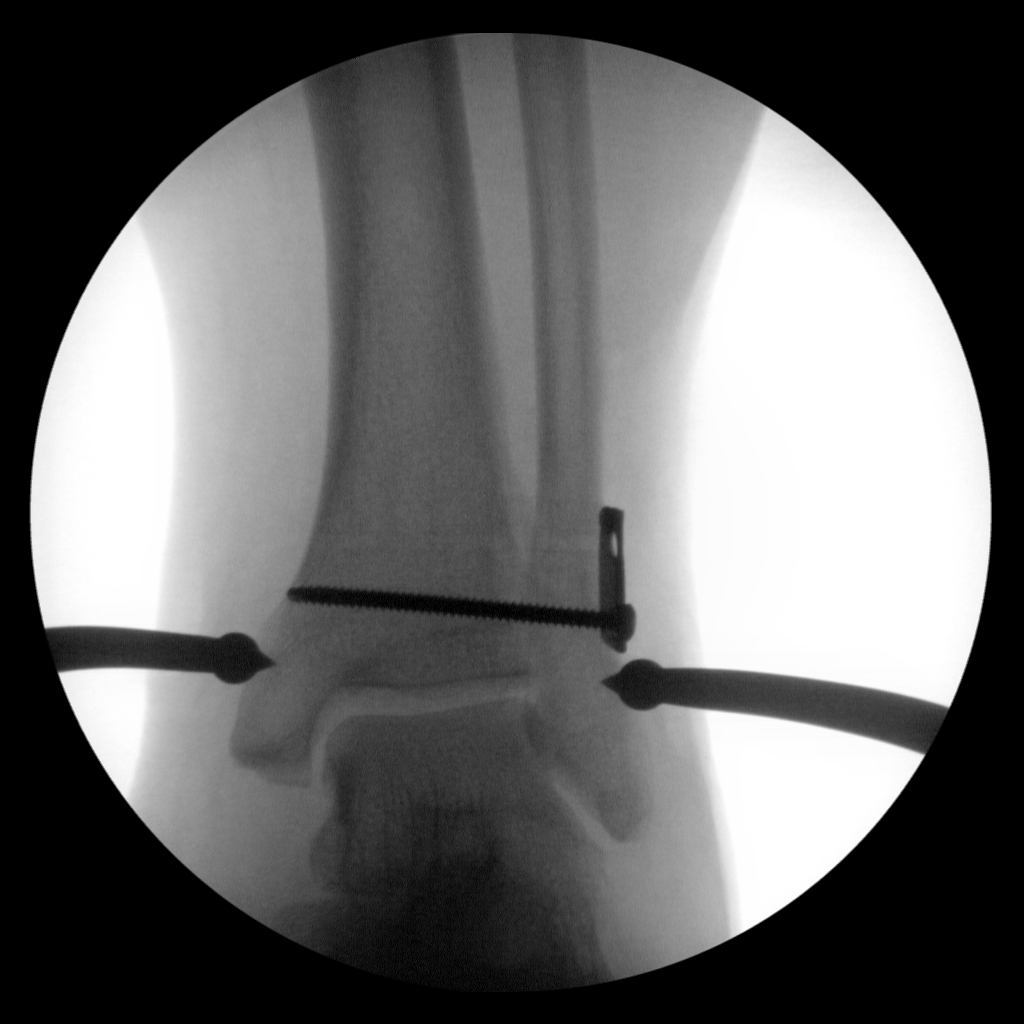
[im 3/4]
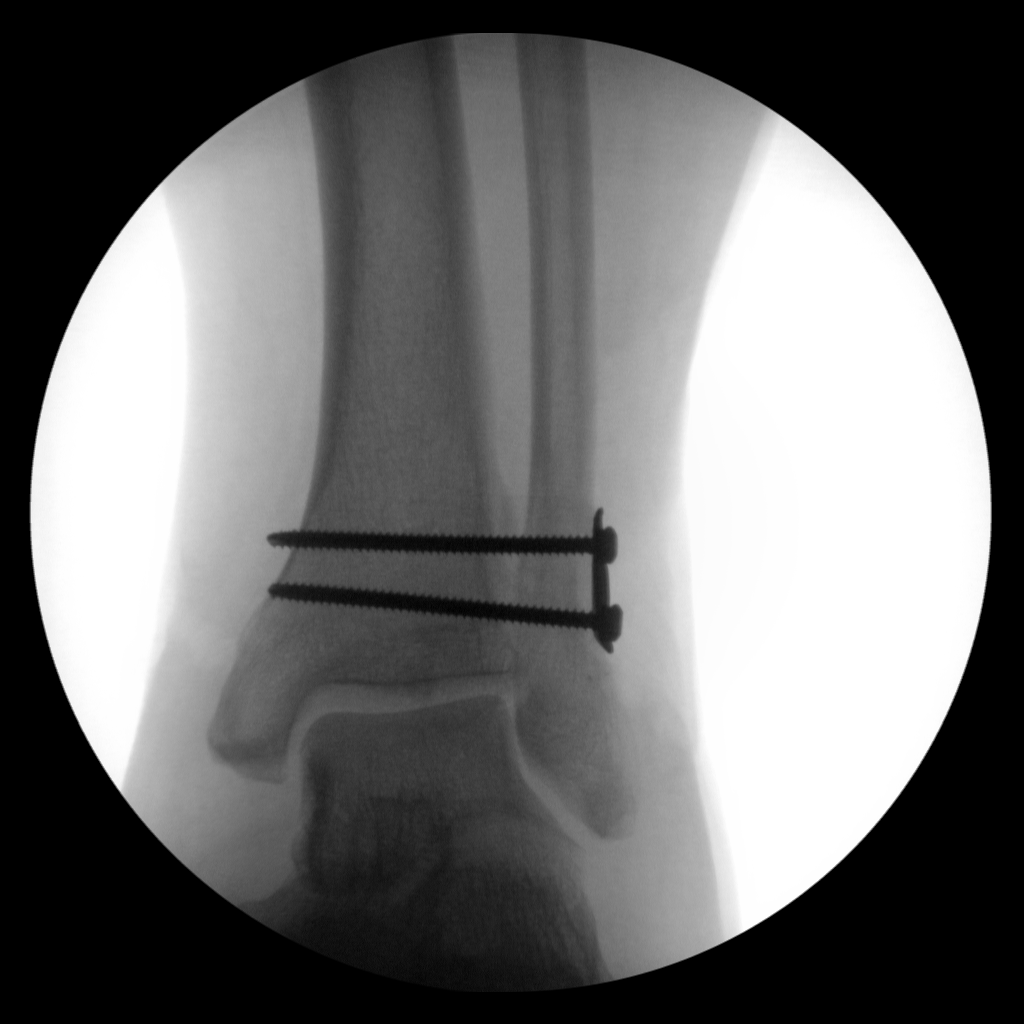
[im 4/4]
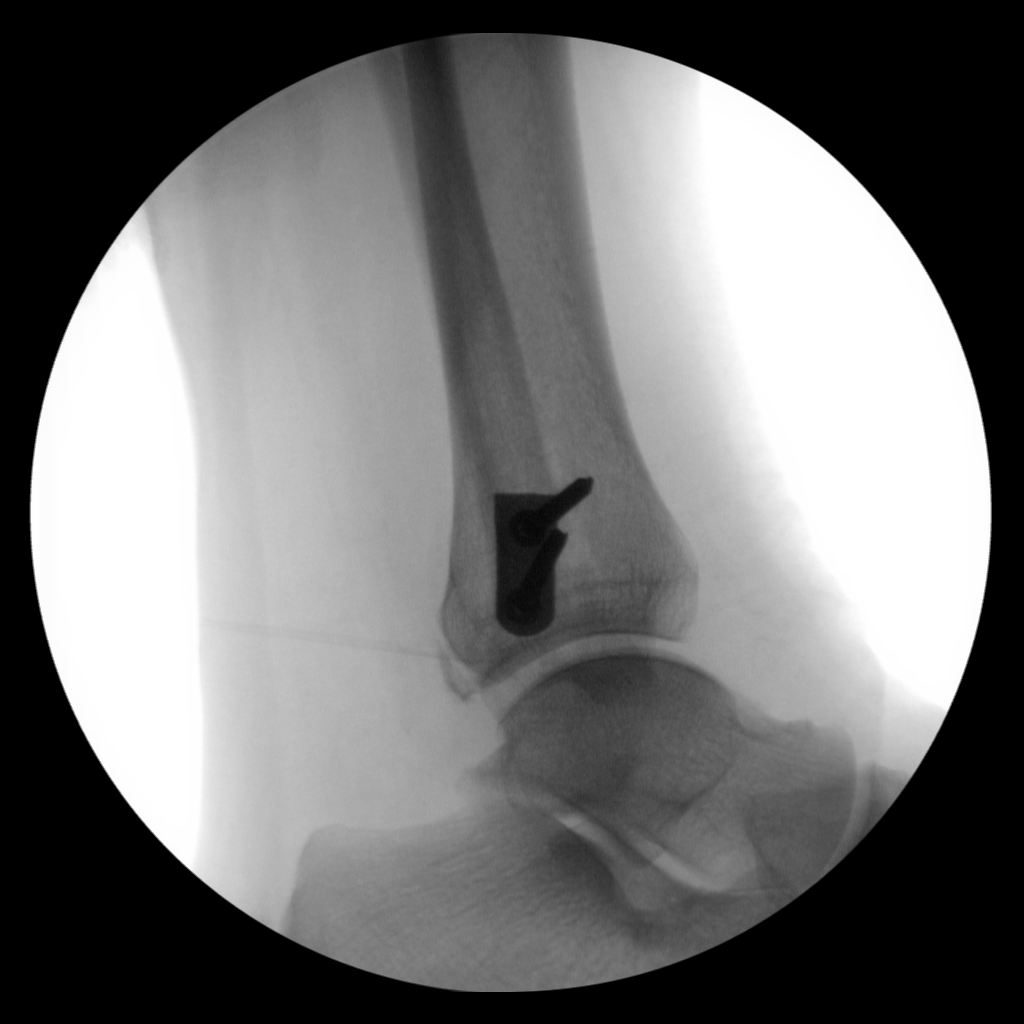

[4 of 4 positions shown; findings below may reference images not displayed]

FINDINGS: Frontal, oblique and lateral views obtained. There is screw and
plate fixation through the distal tibiofibular syndesmosis with
alignment anatomic in appearance. There is evidence of a fracture of
the posterior distal tibia with alignment near anatomic. Ankle
mortise appears grossly intact on submitted images.
IMPRESSION: Screw and plate fixation through the distal tibiofibular
syndesmosis. Ankle mortise appears grossly intact. Fracture along
the posterior most aspect of the distal tibia with alignment near
anatomic.

## 2016-02-29 SURGERY — OPEN REDUCTION INTERNAL FIXATION (ORIF) ANKLE FRACTURE
Anesthesia: General | Site: Ankle | Laterality: Left

## 2016-02-29 MED ORDER — ACETAMINOPHEN 500 MG PO TABS
1000.0000 mg | ORAL_TABLET | Freq: Four times a day (QID) | ORAL | Status: DC
  Administered 2016-02-29 – 2016-03-02 (×8): 1000 mg via ORAL
  Filled 2016-02-29 (×8): qty 2

## 2016-02-29 MED ORDER — NEOSTIGMINE METHYLSULFATE 5 MG/5ML IV SOSY
PREFILLED_SYRINGE | INTRAVENOUS | Status: AC
  Filled 2016-02-29: qty 5

## 2016-02-29 MED ORDER — METOCLOPRAMIDE HCL 5 MG/ML IJ SOLN
INTRAMUSCULAR | Status: AC
  Filled 2016-02-29: qty 2

## 2016-02-29 MED ORDER — SUCCINYLCHOLINE CHLORIDE 200 MG/10ML IV SOSY
PREFILLED_SYRINGE | INTRAVENOUS | Status: DC | PRN
  Administered 2016-02-29: 140 mg via INTRAVENOUS

## 2016-02-29 MED ORDER — EPHEDRINE 5 MG/ML INJ
INTRAVENOUS | Status: AC
  Filled 2016-02-29: qty 10

## 2016-02-29 MED ORDER — ROCURONIUM BROMIDE 50 MG/5ML IV SOSY
PREFILLED_SYRINGE | INTRAVENOUS | Status: DC | PRN
  Administered 2016-02-29: 50 mg via INTRAVENOUS

## 2016-02-29 MED ORDER — KETAMINE HCL 10 MG/ML IJ SOLN
INTRAMUSCULAR | Status: DC | PRN
  Administered 2016-02-29: 40 mg via INTRAVENOUS

## 2016-02-29 MED ORDER — HYDROMORPHONE HCL 1 MG/ML IJ SOLN
INTRAMUSCULAR | Status: AC
  Filled 2016-02-29: qty 1

## 2016-02-29 MED ORDER — CHLORHEXIDINE GLUCONATE 4 % EX LIQD: 60.0000 mL | Freq: Once | CUTANEOUS | Status: DC

## 2016-02-29 MED ORDER — DEXAMETHASONE SODIUM PHOSPHATE 4 MG/ML IJ SOLN
INTRAMUSCULAR | Status: DC | PRN
  Administered 2016-02-29: 10 mg via INTRAVENOUS

## 2016-02-29 MED ORDER — MORPHINE SULFATE (PF) 2 MG/ML IV SOLN
2.0000 mg | INTRAVENOUS | Status: DC | PRN
  Administered 2016-02-29 – 2016-03-01 (×2): 2 mg via INTRAVENOUS
  Filled 2016-02-29 (×2): qty 1

## 2016-02-29 MED ORDER — GABAPENTIN 300 MG PO CAPS
300.0000 mg | ORAL_CAPSULE | Freq: Every day | ORAL | Status: DC
  Administered 2016-02-29 – 2016-03-02 (×3): 300 mg via ORAL
  Filled 2016-02-29 (×3): qty 1

## 2016-02-29 MED ORDER — INSULIN ASPART 100 UNIT/ML ~~LOC~~ SOLN
0.0000 [IU] | Freq: Three times a day (TID) | SUBCUTANEOUS | Status: DC
  Administered 2016-02-29 – 2016-03-01 (×4): 3 [IU] via SUBCUTANEOUS
  Administered 2016-03-01: 5 [IU] via SUBCUTANEOUS
  Administered 2016-03-02: 3 [IU] via SUBCUTANEOUS
  Filled 2016-02-29 (×3): qty 1

## 2016-02-29 MED ORDER — LIDOCAINE 2% (20 MG/ML) 5 ML SYRINGE
INTRAMUSCULAR | Status: DC | PRN
  Administered 2016-02-29: 60 mg via INTRAVENOUS

## 2016-02-29 MED ORDER — DEXAMETHASONE SODIUM PHOSPHATE 10 MG/ML IJ SOLN
INTRAMUSCULAR | Status: AC
  Filled 2016-02-29: qty 1

## 2016-02-29 MED ORDER — PROMETHAZINE HCL 25 MG/ML IJ SOLN: 6.2500 mg | INTRAMUSCULAR | Status: DC | PRN

## 2016-02-29 MED ORDER — PHENYLEPHRINE 40 MCG/ML (10ML) SYRINGE FOR IV PUSH (FOR BLOOD PRESSURE SUPPORT)
PREFILLED_SYRINGE | INTRAVENOUS | Status: AC
  Filled 2016-02-29: qty 10

## 2016-02-29 MED ORDER — PHENYLEPHRINE HCL 10 MG/ML IJ SOLN
INTRAVENOUS | Status: DC | PRN
  Administered 2016-02-29: 50 ug/min via INTRAVENOUS

## 2016-02-29 MED ORDER — GLYCOPYRROLATE 0.2 MG/ML IV SOSY
PREFILLED_SYRINGE | INTRAVENOUS | Status: AC
  Filled 2016-02-29: qty 6

## 2016-02-29 MED ORDER — 0.9 % SODIUM CHLORIDE (POUR BTL) OPTIME
TOPICAL | Status: DC | PRN
  Administered 2016-02-29: 1000 mL

## 2016-02-29 MED ORDER — FENTANYL CITRATE (PF) 100 MCG/2ML IJ SOLN
INTRAMUSCULAR | Status: AC
  Filled 2016-02-29: qty 2

## 2016-02-29 MED ORDER — LACTATED RINGERS IV SOLN
INTRAVENOUS | Status: DC | PRN
  Administered 2016-02-29 (×2): via INTRAVENOUS

## 2016-02-29 MED ORDER — HYDROMORPHONE HCL 1 MG/ML IJ SOLN
0.2500 mg | INTRAMUSCULAR | Status: DC | PRN
  Administered 2016-02-29 (×3): 0.5 mg via INTRAVENOUS

## 2016-02-29 MED ORDER — ACETAMINOPHEN 325 MG PO TABS: 650.0000 mg | ORAL_TABLET | Freq: Four times a day (QID) | ORAL | Status: DC | PRN

## 2016-02-29 MED ORDER — PROPOFOL 10 MG/ML IV BOLUS
INTRAVENOUS | Status: DC | PRN
  Administered 2016-02-29: 200 mg via INTRAVENOUS

## 2016-02-29 MED ORDER — METOCLOPRAMIDE HCL 5 MG/ML IJ SOLN
INTRAMUSCULAR | Status: DC | PRN
  Administered 2016-02-29: 10 mg via INTRAVENOUS

## 2016-02-29 MED ORDER — NEOSTIGMINE METHYLSULFATE 10 MG/10ML IV SOLN
INTRAVENOUS | Status: DC | PRN
  Administered 2016-02-29: 4 mg via INTRAVENOUS

## 2016-02-29 MED ORDER — ONDANSETRON HCL 4 MG/2ML IJ SOLN
INTRAMUSCULAR | Status: DC | PRN
  Administered 2016-02-29: 4 mg via INTRAVENOUS

## 2016-02-29 MED ORDER — ATORVASTATIN CALCIUM 20 MG PO TABS
40.0000 mg | ORAL_TABLET | Freq: Every day | ORAL | Status: DC
  Administered 2016-02-29 – 2016-03-02 (×3): 40 mg via ORAL
  Filled 2016-02-29 (×3): qty 2

## 2016-02-29 MED ORDER — AMLODIPINE BESYLATE 10 MG PO TABS
10.0000 mg | ORAL_TABLET | Freq: Every day | ORAL | Status: DC
  Administered 2016-02-29 – 2016-03-02 (×3): 10 mg via ORAL
  Filled 2016-02-29 (×3): qty 1

## 2016-02-29 MED ORDER — ENOXAPARIN SODIUM 40 MG/0.4ML ~~LOC~~ SOLN
40.0000 mg | SUBCUTANEOUS | Status: DC
  Administered 2016-02-29 – 2016-03-02 (×2): 40 mg via SUBCUTANEOUS
  Filled 2016-02-29 (×2): qty 0.4

## 2016-02-29 MED ORDER — ALLOPURINOL 100 MG PO TABS
100.0000 mg | ORAL_TABLET | Freq: Every day | ORAL | Status: DC
  Administered 2016-02-29 – 2016-03-02 (×3): 100 mg via ORAL
  Filled 2016-02-29 (×3): qty 1

## 2016-02-29 MED ORDER — PHENYLEPHRINE 40 MCG/ML (10ML) SYRINGE FOR IV PUSH (FOR BLOOD PRESSURE SUPPORT)
PREFILLED_SYRINGE | INTRAVENOUS | Status: DC | PRN
  Administered 2016-02-29 (×2): 80 ug via INTRAVENOUS
  Administered 2016-02-29: 120 ug via INTRAVENOUS
  Administered 2016-02-29: 160 ug via INTRAVENOUS
  Administered 2016-02-29: 120 ug via INTRAVENOUS
  Administered 2016-02-29: 80 ug via INTRAVENOUS

## 2016-02-29 MED ORDER — EPHEDRINE SULFATE-NACL 50-0.9 MG/10ML-% IV SOSY
PREFILLED_SYRINGE | INTRAVENOUS | Status: DC | PRN
  Administered 2016-02-29: 20 mg via INTRAVENOUS
  Administered 2016-02-29: 10 mg via INTRAVENOUS
  Administered 2016-02-29: 20 mg via INTRAVENOUS

## 2016-02-29 MED ORDER — DOCUSATE SODIUM 100 MG PO CAPS
100.0000 mg | ORAL_CAPSULE | Freq: Two times a day (BID) | ORAL | Status: DC
  Administered 2016-02-29 – 2016-03-02 (×5): 100 mg via ORAL
  Filled 2016-02-29 (×5): qty 1

## 2016-02-29 MED ORDER — OXYCODONE HCL 5 MG PO TABS
5.0000 mg | ORAL_TABLET | ORAL | Status: DC | PRN
  Administered 2016-02-29: 10 mg via ORAL
  Administered 2016-02-29: 5 mg via ORAL
  Administered 2016-03-01 – 2016-03-02 (×10): 10 mg via ORAL
  Filled 2016-02-29 (×9): qty 2
  Filled 2016-02-29: qty 1
  Filled 2016-02-29 (×2): qty 2

## 2016-02-29 MED ORDER — LUBIPROSTONE 24 MCG PO CAPS
24.0000 ug | ORAL_CAPSULE | Freq: Two times a day (BID) | ORAL | Status: DC
  Administered 2016-02-29 – 2016-03-02 (×5): 24 ug via ORAL
  Filled 2016-02-29 (×5): qty 1

## 2016-02-29 MED ORDER — MIDAZOLAM HCL 5 MG/5ML IJ SOLN
INTRAMUSCULAR | Status: DC | PRN
  Administered 2016-02-29 (×2): 0.5 mg via INTRAVENOUS

## 2016-02-29 MED ORDER — ONDANSETRON HCL 4 MG/2ML IJ SOLN
INTRAMUSCULAR | Status: AC
  Filled 2016-02-29: qty 2

## 2016-02-29 MED ORDER — BUPIVACAINE HCL (PF) 0.25 % IJ SOLN
INTRAMUSCULAR | Status: DC | PRN
  Administered 2016-02-29: 30 mL

## 2016-02-29 MED ORDER — LIDOCAINE 2% (20 MG/ML) 5 ML SYRINGE
INTRAMUSCULAR | Status: AC
  Filled 2016-02-29: qty 5

## 2016-02-29 MED ORDER — MIDAZOLAM HCL 2 MG/2ML IJ SOLN
INTRAMUSCULAR | Status: AC
  Filled 2016-02-29: qty 2

## 2016-02-29 MED ORDER — ROCURONIUM BROMIDE 50 MG/5ML IV SOSY
PREFILLED_SYRINGE | INTRAVENOUS | Status: AC
  Filled 2016-02-29: qty 5

## 2016-02-29 MED ORDER — SUCCINYLCHOLINE CHLORIDE 20 MG/ML IJ SOLN
INTRAMUSCULAR | Status: AC
Start: 2016-02-29 — End: 2016-02-29
  Filled 2016-02-29: qty 1

## 2016-02-29 MED ORDER — GLYCOPYRROLATE 0.2 MG/ML IJ SOLN
INTRAMUSCULAR | Status: DC | PRN
  Administered 2016-02-29: 0.1 mg via INTRAVENOUS
  Administered 2016-02-29: 0.6 mg via INTRAVENOUS
  Administered 2016-02-29: 0.1 mg via INTRAVENOUS

## 2016-02-29 MED ORDER — CARVEDILOL 25 MG PO TABS
25.0000 mg | ORAL_TABLET | Freq: Once | ORAL | Status: AC
  Administered 2016-02-29: 25 mg via ORAL
  Filled 2016-02-29: qty 1

## 2016-02-29 MED ORDER — FENTANYL CITRATE (PF) 100 MCG/2ML IJ SOLN
INTRAMUSCULAR | Status: DC | PRN
  Administered 2016-02-29: 50 ug via INTRAVENOUS
  Administered 2016-02-29: 100 ug via INTRAVENOUS
  Administered 2016-02-29: 50 ug via INTRAVENOUS

## 2016-02-29 MED ORDER — KETAMINE HCL 10 MG/ML IJ SOLN
INTRAMUSCULAR | Status: AC
  Filled 2016-02-29: qty 1

## 2016-02-29 MED ORDER — PHENYLEPHRINE HCL 10 MG/ML IJ SOLN
INTRAMUSCULAR | Status: AC
  Filled 2016-02-29: qty 1

## 2016-02-29 MED ORDER — PROPOFOL 10 MG/ML IV BOLUS
INTRAVENOUS | Status: AC
  Filled 2016-02-29: qty 20

## 2016-02-29 MED ORDER — ACETAMINOPHEN 650 MG RE SUPP: 650.0000 mg | Freq: Four times a day (QID) | RECTAL | Status: DC | PRN

## 2016-02-29 MED ORDER — CARVEDILOL 25 MG PO TABS
25.0000 mg | ORAL_TABLET | Freq: Two times a day (BID) | ORAL | Status: DC
  Administered 2016-02-29 – 2016-03-02 (×5): 25 mg via ORAL
  Filled 2016-02-29 (×5): qty 1

## 2016-02-29 MED ORDER — BUPIVACAINE HCL (PF) 0.25 % IJ SOLN
INTRAMUSCULAR | Status: AC
  Filled 2016-02-29: qty 30

## 2016-02-29 SURGICAL SUPPLY — 42 items
BAG SPEC THK2 15X12 ZIP CLS (MISCELLANEOUS) ×1
BAG ZIPLOCK 12X15 (MISCELLANEOUS) ×2 IMPLANT
BANDAGE ACE 4X5 VEL STRL LF (GAUZE/BANDAGES/DRESSINGS) ×1 IMPLANT
BANDAGE ACE 6X5 VEL STRL LF (GAUZE/BANDAGES/DRESSINGS) ×1 IMPLANT
CUFF TOURN SGL QUICK 34 (TOURNIQUET CUFF) ×2
CUFF TRNQT CYL 34X4X40X1 (TOURNIQUET CUFF) ×1 IMPLANT
DRAPE C-ARM 42X120 X-RAY (DRAPES) ×2 IMPLANT
DRAPE EXTREMITY T 121X128X90 (DRAPE) ×1 IMPLANT
DRAPE U-SHAPE 47X51 STRL (DRAPES) ×2 IMPLANT
DRSG ADAPTIC 3X8 NADH LF (GAUZE/BANDAGES/DRESSINGS) ×2 IMPLANT
DRSG PAD ABDOMINAL 8X10 ST (GAUZE/BANDAGES/DRESSINGS) ×2 IMPLANT
DURAPREP 26ML APPLICATOR (WOUND CARE) ×3 IMPLANT
ELECT REM PT RETURN 9FT ADLT (ELECTROSURGICAL) ×2
ELECTRODE REM PT RTRN 9FT ADLT (ELECTROSURGICAL) ×1 IMPLANT
GAUZE SPONGE 4X4 12PLY STRL (GAUZE/BANDAGES/DRESSINGS) ×3 IMPLANT
GAUZE XEROFORM 1X8 LF (GAUZE/BANDAGES/DRESSINGS) ×1 IMPLANT
GLOVE BIOGEL PI IND STRL 7.5 (GLOVE) ×1 IMPLANT
GLOVE BIOGEL PI IND STRL 8.5 (GLOVE) ×1 IMPLANT
GLOVE BIOGEL PI INDICATOR 7.5 (GLOVE) ×3
GLOVE BIOGEL PI INDICATOR 8.5 (GLOVE) ×1
GLOVE ORTHO TXT STRL SZ7.5 (GLOVE) ×4 IMPLANT
GLOVE SURG SS PI 7.5 STRL IVOR (GLOVE) ×1 IMPLANT
GOWN STRL REUS W/ TWL XL LVL3 (GOWN DISPOSABLE) IMPLANT
GOWN STRL REUS W/TWL LRG LVL3 (GOWN DISPOSABLE) ×2 IMPLANT
GOWN STRL REUS W/TWL XL LVL3 (GOWN DISPOSABLE) ×2
MANIFOLD NEPTUNE II (INSTRUMENTS) ×2 IMPLANT
PACK TOTAL JOINT (CUSTOM PROCEDURE TRAY) ×2 IMPLANT
PAD ABD 8X10 STRL (GAUZE/BANDAGES/DRESSINGS) ×1 IMPLANT
PAD CAST 4YDX4 CTTN HI CHSV (CAST SUPPLIES) ×2 IMPLANT
PADDING CAST COTTON 4X4 STRL (CAST SUPPLIES) ×2
PADDING CAST COTTON 6X4 STRL (CAST SUPPLIES) ×1 IMPLANT
PLATE LCP 3.5 1/3 TUB 5HX57 (Plate) ×1 IMPLANT
POSITIONER SURGICAL ARM (MISCELLANEOUS) ×2 IMPLANT
SCREW CORTEX 3.5 55MM (Screw) ×2 IMPLANT
STRIP CLOSURE SKIN 1/2X4 (GAUZE/BANDAGES/DRESSINGS) ×2 IMPLANT
SUT ETHILON 3 0 PS 1 (SUTURE) ×2 IMPLANT
SUT MNCRL AB 4-0 PS2 18 (SUTURE) ×2 IMPLANT
SUT VIC AB 1 CT1 27 (SUTURE) ×4
SUT VIC AB 1 CT1 27XBRD ANTBC (SUTURE) ×2 IMPLANT
SUT VIC AB 2-0 CT1 27 (SUTURE) ×2
SUT VIC AB 2-0 CT1 TAPERPNT 27 (SUTURE) ×1 IMPLANT
TOWEL OR 17X26 10 PK STRL BLUE (TOWEL DISPOSABLE) ×4 IMPLANT

## 2016-02-29 NOTE — Transfer of Care (Signed)
Immediate Anesthesia Transfer of Care Note  Patient: Nathan Proctor.  Procedure(s) Performed: Procedure(s): OPEN REDUCTION INTERNAL FIXATION (ORIF) ANKLE FRACTURE (Left)  Patient Location: PACU  Anesthesia Type:General  Level of Consciousness: Patient easily awoken, sedated, comfortable, cooperative, following commands, responds to stimulation.   Airway & Oxygen Therapy: Patient spontaneously breathing, ventilating well, oxygen via simple oxygen mask.  Post-op Assessment: Report given to PACU RN, vital signs reviewed and stable, moving all extremities.   Post vital signs: Reviewed and stable.  Complications: No apparent anesthesia complications Last Vitals:  Vitals:   02/29/16 0704 02/29/16 1017  BP: (!) 159/73 135/74  Pulse: 88 79  Resp: 20 10  Temp: 36.7 C 36.6 C    Last Pain:  Vitals:   02/29/16 0704  TempSrc: Oral      Patients Stated Pain Goal: 4 ([id number])  Complications: No apparent anesthesia complications

## 2016-02-29 NOTE — Anesthesia Procedure Notes (Signed)
Procedure Name: Intubation Date/Time: 02/29/2016 8:42 AM Performed by: Deliah Boston Pre-anesthesia Checklist: Patient identified, Emergency Drugs available, Suction available and Patient being monitored Patient Re-evaluated:Patient Re-evaluated prior to inductionOxygen Delivery Method: Circle system utilized Preoxygenation: Pre-oxygenation with 100% oxygen Intubation Type: IV induction Ventilation: Mask ventilation without difficulty Laryngoscope Size: Mac and 4 Grade View: Grade II Tube type: Oral Tube size: 7.5 mm Number of attempts: 1 Airway Equipment and Method: Stylet and Oral airway Placement Confirmation: ETT inserted through vocal cords under direct vision,  positive ETCO2 and breath sounds checked- equal and bilateral Secured at: 24 cm Tube secured with: Tape Dental Injury: Teeth and Oropharynx as per pre-operative assessment

## 2016-02-29 NOTE — H&P (Signed)
ORTHOPAEDIC H and P  REQUESTING PHYSICIAN: Nicholes Stairs, MD  PCP:  Foye Spurling, MD  Chief Complaint: Left ankle fracture  HPI: Nathan Proctor. is a 75 y.o. male who complains of  Left ankle fracture after a fall on 02/15/2016.  We saw and evaluated him in clinic following his fall and have elected to proceed with surgical fixation today.  All risks and benefits were discussed preoperatively.  He is ready to proceed.  Past Medical History:  Diagnosis Date  . Anemia of chronic disease   . Ankle fracture 02/15/2016  . Cancer [facility])    Prostate  . Chronic constipation   . Chronic kidney disease    stage III  . Diabetes mellitus without complication ([place])    type II   . Diabetic peripheral neuropathy ([place])   . Failure to thrive (0-17)   . Fracture of left lower leg   . Gout   . Hyperlipidemia   . Hypertension   . Morbid obesity ([place])   . Unstable gait    Past Surgical History:  Procedure Laterality Date  . PROSTATE SURGERY     Social History   Social History  . Marital status: Married    Spouse name: N/A  . Number of children: N/A  . Years of education: N/A   Social History Main Topics  . Smoking status: Never Smoker  . Smokeless tobacco: Never Used  . Alcohol use No  . Drug use: No  . Sexual activity: Not Asked   Other Topics Concern  . None   Social History Narrative  . None   History reviewed. No pertinent family history. No Known Allergies Prior to Admission medications   Medication Sig Start Date End Date Taking? Authorizing Provider  acetaminophen (TYLENOL) 500 MG tablet Take 1,000 mg by mouth every 8 (eight) hours as needed for mild pain.   Yes Historical Provider, MD  allopurinol (ZYLOPRIM) 100 MG tablet Take 100 mg by mouth daily.  01/25/16  Yes Historical Provider, MD  amLODipine (NORVASC) 10 MG tablet Take 10 mg by mouth daily.    Yes Historical Provider, MD  atorvastatin (LIPITOR) 40 MG tablet Take 40 mg by mouth daily.    Yes  Historical Provider, MD  carvedilol (COREG) 25 MG tablet Take 25 mg by mouth 2 (two) times daily with a meal.    Yes Historical Provider, MD  enoxaparin (LOVENOX) 40 MG/0.4ML injection Inject 40 mg into the skin daily with breakfast. At 8am per MAR.   Yes Historical Provider, MD  gabapentin (NEURONTIN) 300 MG capsule Take 300 mg by mouth daily.   Yes Historical Provider, MD  HYDROcodone-acetaminophen (NORCO/VICODIN) 5-325 MG tablet Take 1 tablet by mouth every 8 (eight) hours as needed for moderate pain.   Yes Historical Provider, MD  insulin lispro protamine-lispro (HUMALOG 75/25 MIX) (75-25) 100 UNIT/ML SUSP injection Inject 60 Units into the skin daily with breakfast.   Yes Historical Provider, MD  lubiprostone (AMITIZA) 24 MCG capsule Take 24 mcg by mouth 2 (two) times daily with a meal.    Yes Historical Provider, MD  Multiple Vitamin (MULTIVITAMIN WITH MINERALS) TABS tablet Take 1 tablet by mouth daily.   Yes Historical Provider, MD  Vitamin D, Ergocalciferol, (DRISDOL) 50000 units CAPS capsule Take 50,000 Units by mouth 2 (two) times a week. On Wed and Fri per [facility].   Yes Historical Provider, MD   No results found.  Positive ROS: All other systems have been reviewed and were  otherwise negative with the exception of those mentioned in the HPI and as above.  Physical Exam: General: Alert, no acute distress Cardiovascular: No pedal edema Respiratory: No cyanosis, no use of accessory musculature GI: No organomegaly, abdomen is soft and non-tender Skin: No lesions in the area of chief complaint Neurologic: Sensation intact distally Psychiatric: Patient is competent for consent with normal mood and affect Lymphatic: No axillary or cervical lymphadenopathy  MUSCULOSKELETAL:  LLE- splinted, with skin wwp, wiggles toes.  Assessment: Left ankle fracture  Plan: -OR today for ORIF -NWB with splint post op -will start PT/OT on floor for dc planning    Nicholes Stairs, MD Cell  [phone]    02/29/2016 8:12 AM

## 2016-02-29 NOTE — Anesthesia Preprocedure Evaluation (Signed)
Anesthesia Evaluation  Patient identified by MRN, date of birth, ID band Patient awake    Reviewed: Allergy & Precautions, NPO status , Patient's Chart, lab work & pertinent test results  Airway Mallampati: II  TM Distance: >3 FB Neck ROM: Full    Dental no notable dental hx.    Pulmonary neg pulmonary ROS,    Pulmonary exam normal breath sounds clear to auscultation       Cardiovascular hypertension, Normal cardiovascular exam Rhythm:Regular Rate:Normal     Neuro/Psych negative neurological ROS  negative psych ROS   GI/Hepatic negative GI ROS, Neg liver ROS,   Endo/Other  diabetesMorbid obesity  Renal/GU negative Renal ROS  negative genitourinary   Musculoskeletal negative musculoskeletal ROS (+)   Abdominal   Peds negative pediatric ROS (+)  Hematology negative hematology ROS (+)   Anesthesia Other Findings   Reproductive/Obstetrics negative OB ROS                             Anesthesia Physical Anesthesia Plan  ASA: III  Anesthesia Plan: General   Post-op Pain Management:    Induction: Intravenous  Airway Management Planned: Oral ETT  Additional Equipment:   Intra-op Plan:   Post-operative Plan: Extubation in OR  Informed Consent: I have reviewed the patients History and Physical, chart, labs and discussed the procedure including the risks, benefits and alternatives for the proposed anesthesia with the patient or authorized representative who has indicated his/her understanding and acceptance.   Dental advisory given  Plan Discussed with: CRNA and Surgeon  Anesthesia Plan Comments:         Anesthesia Quick Evaluation

## 2016-02-29 NOTE — Clinical Social Work Note (Signed)
Clinical Social Work Assessment  Patient Details  Name: Nathan L Lawlor Jr. MRN: [id number] Date of Birth: 04/07/1941  Date of referral:  02/29/16               Reason for consult:  Discharge Planning                Permission sought to share information with:  Facility Contact Representative Permission granted to share information::  Yes, Verbal Permission Granted  Name::        Agency::     Relationship::     Contact Information:     Housing/Transportation Living arrangements for the past 2 months:  Skilled Nursing Facility Source of Information:  Patient Patient Interpreter Needed:  None Criminal Activity/Legal Involvement Pertinent to Current Situation/Hospitalization:  No - Comment as needed Significant Relationships:  Spouse Lives with:  Facility Resident, Spouse Do you feel safe going back to the place where you live?  Yes Need for family participation in patient care:  Yes (Comment)  Care giving concerns:  Pt is concerned regarding insurance covering cost of SNF placement for prior stay at [facility]. CSW has contacted SNF regarding this concern. Awaiting return call.   Social Worker assessment / plan:  Pt hospitalized on 02/29/16 from [facility] with a left ankle fx. Pt fx his ankle on 02/15/16. Surgery was completed today. SNF placement ( pvt pay ) has been needed since fx occurred due to limited assistance available at home. CSW met with pt / spouse today to assist with d/c planning. PT evaluation is pending. Pt reports that he will follow recommendations provided from PT / MD. [facility] contacted and will readmit pt, when stable, if rehab is needed. CSW reassured pt that is PT recommends SNF at d/c CSW will assist with [employer] medicare authorization so, hopefully, insurance will assist with SNF payment.   Employment status:  Retired Insurance information:  Managed Medicare PT Recommendations:  Not assessed at this time Information / Referral to community resources:   Skilled Nursing Facility  Patient/Family's Response to care:  Disposition to be determined.  Patient/Family's Understanding of and Emotional Response to Diagnosis, Current Treatment, and Prognosis: Pt / spouse are aware of pt's medical status. Pt is relieved surgery is completed and went well. " I'm not sure if I'm going home from here or returning to [facility]. " Pt / spouse appreciate CSW's assistance with d/c planning.  Emotional Assessment Appearance:  Appears stated age Attitude/Demeanor/Rapport:    Affect (typically observed):  Other (cooperative) Orientation:  Oriented to Self, Oriented to Place, Oriented to Situation Alcohol / Substance use:  Not Applicable Psych involvement (Current and /or in the community):  No (Comment)  Discharge Needs  Concerns to be addressed:  Discharge Planning Concerns Readmission within the last 30 days:  No Current discharge risk:  None Barriers to Discharge:  No Barriers Identified   Haidinger, Jamie Lee, LCSW  [phone] 02/29/2016, 1:23 PM  

## 2016-02-29 NOTE — Op Note (Signed)
   Date of Surgery: 02/29/2016  INDICATIONS: Nathan Proctor is a 75 y.o.-year-old male who sustained a left ankle fracture; he was indicated for open reduction and internal fixation due to the displaced nature of the mortise and syndesmotic injury and came to the operating room today for this procedure. The patient did consent to the procedure after discussion of the risks and benefits.  PREOPERATIVE DIAGNOSIS: left maisonneuve fracture ankle fracture (syndesmotic disruption with proximal fibula fracture)  POSTOPERATIVE DIAGNOSIS: Same.  PROCEDURE: Open treatment of left ankle fracture with internal fixation of the syndesmosis.  CPT [phone]  SURGEON: Dannielle Karvonen. Stann Mainland, M.D.  ASSIST: none.  ANESTHESIA:  general  TOURNIQUET TIME: 53 min  IV FLUIDS AND URINE: See anesthesia.  ESTIMATED BLOOD LOSS: 10 mL.  IMPLANTS: 3.5 mm synthes cortical screws x 2 2 hole 1/3 tubular synthes plate  COMPLICATIONS: None.  DESCRIPTION OF PROCEDURE: The patient was brought to the operating room and placed supine on the operating table.  The patient had been signed prior to the procedure and this was documented. The patient had the anesthesia placed by the anesthesiologist.  A nonsterile tourniquet was placed on the upper thigh.  The prep verification and incision time-outs were performed to confirm that this was the correct patient, site, side and location. The patient had an SCD on the opposite lower extremity. The patient did receive antibiotics prior to the incision and was re-dosed during the procedure as needed at indicated intervals.  The patient had the lower extremity prepped and draped in the standard surgical fashion.  The extremity was exsanguinated using an esmarch bandage and the tourniquet was inflated to 300 mm Hg.   Incision was made over the distal fibula and the tibiofibular joint was debridement to allow for adequate reduction.  Next a stab incision was made along the medial malleolous to allow  for clamp placement.  A king-tong clamp was brought in to facilitate reduction.  The heel was free from the bed and the foot was placed in plantagrade position.  Next, under flouroscopy on the AP view the syndesmosis was reduced using the large reduction clamp.  The reduction was determined to be adequate on the mortise view.  Next we placed two 3.10mm screws through the fibula and tibia, crossing four cortices and parallel to the joint approximately 2 cm and 4 cm proximal to the plafond.  These screws were predrilled and placed through a 2 hole 1/3 tubular plate and tightened securely in place with hand.  The clamp was removed and a final AP and lateral xray confirmed good reduction and placement of the hardware.   The wounds were irrigated, and closed with vicryl with nylon closure for the skin. The wounds were injected with local anesthetic. Sterile gauze was applied followed by a posterior splint. He was awakened and returned to the PACU in stable and satisfactory condition. There were no complications.  Counts were all correct.  POSTOPERATIVE PLAN: Nathan Proctor will remain nonweightbearing on this leg for approximately 10 weeks; Nathan Proctor will return for suture removal in 2 weeks.  He will be immobilized in a short leg splint and then transitioned to a CAM walker at his first follow up appointment.  Nathan Proctor will receive DVT prophylaxis based on other medications, activity level, and risk ratio of bleeding to thrombosis.  Geralynn Rile, [place] [phone] 10:15 AM

## 2016-03-01 LAB — SURGICAL PCR SCREEN
MRSA, PCR: NEGATIVE
STAPHYLOCOCCUS AUREUS: NEGATIVE

## 2016-03-01 LAB — GLUCOSE, CAPILLARY
GLUCOSE-CAPILLARY: 190 mg/dL — AB (ref 65–99)
Glucose-Capillary: 224 mg/dL — ABNORMAL HIGH (ref 65–99)
Glucose-Capillary: 176 mg/dL — ABNORMAL HIGH (ref 65–99)
Glucose-Capillary: 139 mg/dL — ABNORMAL HIGH (ref 65–99)

## 2016-03-01 NOTE — Progress Notes (Signed)
Physical Therapy Treatment Patient Details Name: Nathan Proctor. MRN: [id number] DOB: 1940/08/22 Today's Date: 03/01/2016    History of Present Illness s/p L ORIF for ankle fx    PT Comments    Assisted pt back to bed from recliner. Pt required max assist of 2. In future, mechanical lift recommended for recliner to bed transfers.   Follow Up Recommendations  SNF     Equipment Recommendations  None recommended by PT    Recommendations for Other Services OT consult     Precautions / Restrictions Precautions Precautions: Fall Restrictions Weight Bearing Restrictions: Yes LLE Weight Bearing: Non weight bearing    Mobility  Bed Mobility Overal bed mobility: Needs Assistance Bed Mobility: Sit to Supine     Supine to sit: Min assist Sit to supine: Min assist   General bed mobility comments: support for LLE  Transfers Overall transfer level: Needs assistance Equipment used: Rolling walker (2 wheeled) Transfers: Sit to/from Stand Sit to Stand: +2 safety/equipment;Max assist;+2 physical assistance Stand pivot transfers: +2 safety/equipment;Mod assist;+2 physical assistance       General transfer comment: cues to maintain NWB.  Cues for UE/LLE placement, activity tolerance limited by fatigue, significant effort for sit to stand from recliner, recommending lift for future recliner to bed transfers  Ambulation/Gait                 Stairs            Wheelchair Mobility    Modified Rankin (Stroke Patients Only)       Balance Overall balance assessment: Needs assistance   Sitting balance-Leahy Scale: Good       Standing balance-Leahy Scale: Poor Standing balance comment: requires BUE support 2* NWB LLE status                    Cognition Arousal/Alertness: Awake/alert Behavior During Therapy: WFL for tasks assessed/performed Overall Cognitive Status: Within Functional Limits for tasks assessed                       Exercises      General Comments        Pertinent Vitals/Pain Pain Score: 10-Worst pain ever Pain Location: L ankle Pain Descriptors / Indicators: Aching Pain Intervention(s): Limited activity within patient's tolerance;Monitored during session;Premedicated before session    Home Living Family/patient expects to be discharged to:: Skilled nursing facility Living Arrangements: Spouse/significant other Available Help at Discharge: Family;Available 24 hours/day   Home Access: Stairs to enter   Home Layout: One level Home Equipment: None Additional Comments: pt was at [facility] following injury while awaiting sx.     Prior Function Level of Independence: Needs assistance  Gait / Transfers Assistance Needed: independent prior to injury   Comments: since injury.  Performed sliding board transfers to w/c.  assist for adls   PT Goals (current goals can now be found in the care plan section) Acute Rehab PT Goals Patient Stated Goal: return to independence; recover quickly PT Goal Formulation: With patient Time For Goal Achievement: 03/15/16 Potential to Achieve Goals: Good Progress towards PT goals: Progressing toward goals    Frequency    Min 4X/week      PT Plan Current plan remains appropriate    Co-evaluation PT/OT/SLP Co-Evaluation/Treatment: Yes Reason for Co-Treatment: Complexity of the patient's impairments (multi-system involvement);For patient/therapist safety PT goals addressed during session: Mobility/safety with mobility       End of Session Equipment Utilized During Treatment:  Gait belt Activity Tolerance: Patient limited by fatigue Patient left: with call bell/phone within reach;in bed;with nursing/sitter in room     Time: 1501-1511 PT Time Calculation (min) (ACUTE ONLY): 10 min  Charges:  $Therapeutic Activity: 8-22 mins                    G Codes:      Philomena Doheny 03/01/2016, 3:25 PM [phone]

## 2016-03-01 NOTE — Evaluation (Signed)
Occupational Therapy Evaluation Patient Details Name: Nathan Proctor. MRN: [id number] DOB: Dec 20, 1940 Today's Date: 03/01/2016    History of Present Illness s/p L ORIF for ankle fx   Clinical Impression   This 75 year old man was admitted for the above sx. He will benefit from continued OT in acute and follow up at [facility].  Pt was independent prior to admission, and he is now NWB. Goals in acute are for min guard level.  He needs min A +2 for transfers and up to max A +2 for LB adls.  He needs reinforcement to maintain NWB also    Follow Up Recommendations  SNF    Equipment Recommendations  3 in 1 bedside comode (wide)    Recommendations for Other Services       Precautions / Restrictions Precautions Precautions: Fall Restrictions Weight Bearing Restrictions: Yes LLE Weight Bearing: Non weight bearing      Mobility Bed Mobility Overal bed mobility: Needs Assistance Bed Mobility: Supine to Sit     Supine to sit: Min assist     General bed mobility comments: support for LLE  Transfers Overall transfer level: Needs assistance Equipment used: Rolling walker (2 wheeled) Transfers: Sit to/from [facility] Sit to Stand: Min assist;+2 safety/equipment;From elevated surface Stand pivot transfers: Min assist;+2 safety/equipment       General transfer comment: cues to maintain NWB.  Cues for UE/LLE placement    Balance                                            ADL Overall ADL's : Needs assistance/impaired     Grooming: Set up;Sitting   Upper Body Bathing: Set up;Sitting   Lower Body Bathing: +2 for physical assistance;Sit to/from stand;Moderate assistance   Upper Body Dressing : Set up;Sitting   Lower Body Dressing: Maximal assistance;+2 for safety/equipment;Sit to/from stand   Toilet Transfer: Minimal assistance;+2 for safety/equipment;RW (to chair)   Toileting- Clothing Manipulation and Hygiene: Moderate  assistance;+2 for safety/equipment;Sit to/from stand         General ADL Comments: performed SPT to chair.  Dyspnea 2/4; sats in 90s.  Educated briefly on AE, but did not use this session.  Pt required cues to maintain NWB     Vision     Perception     Praxis      Pertinent Vitals/Pain Pain Assessment: 0-10 Pain Score: 10-Worst pain ever Pain Location: L ankle with movement Pain Descriptors / Indicators: Aching Pain Intervention(s): Limited activity within patient's tolerance;Monitored during session;Repositioned;Patient requesting pain meds-RN notified     Hand Dominance     Extremity/Trunk Assessment Upper Extremity Assessment Upper Extremity Assessment: Overall WFL for tasks assessed           Communication Communication Communication: No difficulties   Cognition Arousal/Alertness: Awake/alert Behavior During Therapy: WFL for tasks assessed/performed Overall Cognitive Status: Within Functional Limits for tasks assessed                     General Comments       Exercises       Shoulder Instructions      Home Living Family/patient expects to be discharged to:: Unsure Living Arrangements: Spouse/significant other Available Help at Discharge: Family;Available 24 hours/day   Home Access: Stairs to enter Entrance Stairs-Number of Steps: 1   Home Layout: One level  Home Equipment: None   Additional Comments: pt was at [facility] place following injury while awaiting sx.       Prior Functioning/Environment Level of Independence: Needs assistance        Comments: since injury.  Performed sliding board transfers to w/c.  assist for adls        OT Problem List: Decreased strength;Decreased activity tolerance;Decreased knowledge of use of DME or AE;Pain   OT Treatment/Interventions: Self-care/ADL training;DME and/or AE instruction;Patient/family education;Balance training;Therapeutic activities    OT Goals(Current goals can  be found in the care plan section) Acute Rehab OT Goals Patient Stated Goal: return to independence; recover quickly OT Goal Formulation: With patient Time For Goal Achievement: 03/08/16 Potential to Achieve Goals: Good ADL Goals Pt Will Perform Lower Body Bathing: sitting/lateral leans;with adaptive equipment;sit to/from stand;with min guard assist Pt Will Perform Lower Body Dressing: with min guard assist;with adaptive equipment;sitting/lateral leans;sit to/from stand Pt Will Transfer to Toilet: with min guard assist;bedside commode;stand pivot transfer  OT Frequency: Min 2X/week   Barriers to D/C:            Co-evaluation PT/OT/SLP Co-Evaluation/Treatment: Yes Reason for Co-Treatment: For patient/therapist safety PT goals addressed during session: Mobility/safety with mobility OT goals addressed during session: ADL's and self-care      End of Session    Activity Tolerance: Patient limited by fatigue Patient left: in chair;with family/visitor present;with call bell/phone within reach   Time: [id number] OT Time Calculation (min): 28 min Charges:  OT General Charges $OT Visit: 1 Procedure OT Evaluation $OT Eval Low Complexity: 1 Procedure G-Codes:    Rakeya Glab 03/10/16, 11:22 AM Lesle Chris, OTR/L [phone] 10-Mar-2016

## 2016-03-01 NOTE — Progress Notes (Signed)
CSW assisting with d/c planning. PN reviewed. PT OT recommend ST Rehab at d/c. Pt plans to return to [facility]. SNF is working with [employer] for authorization. CSW will continue to follow to assist with d/c planning to SNF.  Werner Lean LCSW [phone]

## 2016-03-01 NOTE — Anesthesia Postprocedure Evaluation (Signed)
Anesthesia Post Note  Patient: Nathan Proctor.  Procedure(s) Performed: Procedure(s) (LRB): OPEN REDUCTION INTERNAL FIXATION (ORIF) ANKLE FRACTURE (Left)  Patient location during evaluation: PACU Anesthesia Type: General Level of consciousness: awake and alert Pain management: pain level controlled Vital Signs Assessment: post-procedure vital signs reviewed and stable Respiratory status: spontaneous breathing, nonlabored ventilation, respiratory function stable and patient connected to nasal cannula oxygen Cardiovascular status: blood pressure returned to baseline and stable Postop Assessment: no signs of nausea or vomiting Anesthetic complications: no    Last Vitals:  Vitals:   03/01/16 1000 03/01/16 1330  BP: (!) 122/97 (!) 147/85  Pulse: 91 87  Resp: 18 18  Temp: 37 C 36.5 C    Last Pain:  Vitals:   03/01/16 1330  TempSrc: Axillary  PainSc:                  [name] S

## 2016-03-01 NOTE — Progress Notes (Signed)
   Subjective:  Patient reports pain as moderate.  Elevating.  Objective:   VITALS:   Vitals:   03/01/16 0245 03/01/16 0614 03/01/16 0722 03/01/16 1000  BP: (!) 150/67 (!) 145/66 (!) 141/70 (!) 122/97  Pulse: 80 85 86 91  Resp: 20 18 18 18   Temp: 98.2 F (36.8 C) 98.2 F (36.8 C) 97.1 F (36.2 C) 98.6 F (37 C)  TempSrc: Oral Oral Oral Oral  SpO2: 100% 100% 97% 98%  Weight:      Height:        LLE short leg splint in place, good repair.  +wiggles toes, +SILT DP/SP and skin wwp   Lab Results  Component Value Date   WBC 8.9 02/29/2016   HGB 10.8 (L) 02/29/2016   HCT 34.5 (L) 02/29/2016   MCV 93.8 02/29/2016   PLT 252 02/29/2016   BMET    Component Value Date/Time   NA 146 02/21/2016   K 4.4 02/21/2016   CL 108 02/15/2016 1541   CO2 26 02/15/2016 1541   GLUCOSE 58 (L) 02/15/2016 1541   BUN 18 02/21/2016   CREATININE 1.42 (H) 02/29/2016 1311   CALCIUM 10.0 02/15/2016 1541   GFRNONAA 46 (L) 02/29/2016 1311   GFRAA 54 (L) 02/29/2016 1311     Assessment/Plan: 1 Day Post-Op   Active Problems:   Ankle fracture, left   Closed left ankle fracture  -NWB LLE -PT/OT -elevation  -will continue to work on pain control as inpatient -SSI with DM diet for DM   Nicholes Stairs 03/01/2016, 12:33 PM   Geralynn Rile, MD [phone]

## 2016-03-01 NOTE — NC FL2 (Signed)
[place] LEVEL OF CARE SCREENING TOOL     IDENTIFICATION  Patient Name: Nathan Proctor. Birthdate: May 11, 1940 Sex: male Admission Date (Current Location): 02/29/2016  [facility] and [place] Number:  Herbalist and Address:  [facility],  [place], [place]      Provider Number: [id number]  Attending Physician Name and Address:  Nicholes Stairs, MD  Relative Name and Phone Number:       Current Level of Care: Hospital Recommended Level of Care: [place] Prior Approval Number:    Date Approved/Denied:   PASRR Number: [id number]  Discharge Plan: SNF    Current Diagnoses: Patient Active Problem List   Diagnosis Date Noted  . Ankle fracture, left 02/29/2016  . Closed left ankle fracture 02/29/2016  . Gout 02/20/2016  . Essential hypertension 02/20/2016    Orientation RESPIRATION BLADDER Height & Weight     Self, Time, Situation, Place  Normal Continent Weight: (!) 345 lb 4 oz (156.6 kg) Height:  5\' 10"  (177.8 cm)  BEHAVIORAL SYMPTOMS/MOOD NEUROLOGICAL BOWEL NUTRITION STATUS  Other (Comment) (no behaviors)   Continent Diet  AMBULATORY STATUS COMMUNICATION OF NEEDS Skin   Extensive Assist Verbally Surgical wounds                       Personal Care Assistance Level of Assistance  Bathing, Feeding, Dressing Bathing Assistance: Limited assistance Feeding assistance: Independent Dressing Assistance: Maximum assistance     Functional Limitations Info  Sight, Hearing, Speech Sight Info: Adequate Hearing Info: Adequate Speech Info: Adequate    SPECIAL CARE FACTORS FREQUENCY  PT (By licensed PT), OT (By licensed OT)     PT Frequency: 5x wk OT Frequency: 5x wk            Contractures Contractures Info: Not present    Additional Factors Info  Code Status Code Status Info: Full Code             Current Medications (03/01/2016):  This is the current hospital active  medication list Current Facility-Administered Medications  Medication Dose Route Frequency Provider Last Rate Last Dose  . acetaminophen (TYLENOL) tablet 650 mg  650 mg Oral Q6H PRN Nicholes Stairs, MD       Or  . acetaminophen (TYLENOL) suppository 650 mg  650 mg Rectal Q6H PRN Nicholes Stairs, MD      . acetaminophen (TYLENOL) tablet 1,000 mg  1,000 mg Oral Q6H Nicholes Stairs, MD   1,000 mg at 03/01/16 [phone]  . allopurinol (ZYLOPRIM) tablet 100 mg  100 mg Oral Daily Nicholes Stairs, MD   100 mg at 03/01/16 1037  . amLODipine (NORVASC) tablet 10 mg  10 mg Oral Daily Nicholes Stairs, MD   10 mg at 03/01/16 1037  . atorvastatin (LIPITOR) tablet 40 mg  40 mg Oral q1800 Nicholes Stairs, MD   40 mg at 02/29/16 1719  . carvedilol (COREG) tablet 25 mg  25 mg Oral BID WC Nicholes Stairs, MD   25 mg at 03/01/16 0857  . docusate sodium (COLACE) capsule 100 mg  100 mg Oral BID Nicholes Stairs, MD   100 mg at 03/01/16 1037  . enoxaparin (LOVENOX) injection 40 mg  40 mg Subcutaneous Q24H Nicholes Stairs, MD   40 mg at 02/29/16 2219  . gabapentin (NEURONTIN) capsule 300 mg  300 mg Oral Daily Nicholes Stairs, MD   300 mg  at 03/01/16 1037  . insulin aspart (novoLOG) injection 0-15 Units  0-15 Units Subcutaneous TID WC Nicholes Stairs, MD   3 Units at 03/01/16 [phone]  . lubiprostone (AMITIZA) capsule 24 mcg  24 mcg Oral BID WC Nicholes Stairs, MD   24 mcg at 03/01/16 0857  . morphine 2 MG/ML injection 2 mg  2 mg Intravenous Q2H PRN Nicholes Stairs, MD   2 mg at 02/29/16 1341  . oxyCODONE (Oxy IR/ROXICODONE) immediate release tablet 5-10 mg  5-10 mg Oral Q3H PRN Nicholes Stairs, MD   10 mg at 03/01/16 1037     Discharge Medications: Please see discharge summary for a list of discharge medications.  Relevant Imaging Results:  Relevant Lab Results:   Additional Information SS # [id number]  Nathan Proctor, Randall An, LCSW

## 2016-03-01 NOTE — Care Management Note (Signed)
Case Management Note  Patient Details  Name: Nathan Proctor. MRN: [id number] Date of Birth: [birth year]  Subjective/Objective:                  Open treatment of left ankle fracture with internal fixation of the syndesmosis Action/Plan: Discharge planning Expected Discharge Date:                  Expected Discharge Plan:  [place]  In-House Referral:     Discharge planning Services  CM Consult  Post Acute Care Choice:    Choice offered to:     DME Arranged:    DME Agency:     HH Arranged:    [place] Agency:     Status of Service:  Completed, signed off  If discussed at [employer], dates discussed:    Additional Comments: CM notes pt to go to SNF; CSW aware and arranging.  No other CM needs were communicated. Dellie Catholic, RN 03/01/2016, 11:48 AM

## 2016-03-01 NOTE — Evaluation (Signed)
Physical Therapy Evaluation Patient Details Name: Nathan Proctor. MRN: [id number] DOB: 02/03/1941 Today's Date: 03/01/2016   History of Present Illness  s/p L ORIF for ankle fx  Clinical Impression  Pt admitted with above diagnosis. Pt currently with functional limitations due to the deficits listed below (see PT Problem List). +2 for safety and Min assist for pivot transfer to recliner, activity tolerance limited by fatigue. Pt will benefit from skilled PT to increase their independence and safety with mobility to allow discharge to the venue listed below.       Follow Up Recommendations SNF    Equipment Recommendations  None recommended by PT    Recommendations for Other Services OT consult     Precautions / Restrictions Precautions Precautions: Fall Restrictions Weight Bearing Restrictions: Yes LLE Weight Bearing: Non weight bearing      Mobility  Bed Mobility Overal bed mobility: Needs Assistance Bed Mobility: Supine to Sit     Supine to sit: Min assist     General bed mobility comments: support for LLE  Transfers Overall transfer level: Needs assistance Equipment used: Rolling walker (2 wheeled) Transfers: Sit to/from [facility] Sit to Stand: Min assist;+2 safety/equipment;From elevated surface Stand pivot transfers: Min assist;+2 safety/equipment       General transfer comment: cues to maintain NWB.  Cues for UE/LLE placement, activity tolerance limited by fatigue, 3/4 dyspnea with stand pivot transfer, vital signs stable  Ambulation/Gait                Stairs            Wheelchair Mobility    Modified Rankin (Stroke Patients Only)       Balance Overall balance assessment: Needs assistance   Sitting balance-Leahy Scale: Good       Standing balance-Leahy Scale: Poor Standing balance comment: requires BUE support 2* NWB LLE status                             Pertinent Vitals/Pain Pain  Assessment: 0-10 Pain Score: 10-Worst pain ever Pain Location: L ankle with activity Pain Descriptors / Indicators: Aching Pain Intervention(s): Limited activity within patient's tolerance;Monitored during session;Repositioned;Patient requesting pain meds-RN notified    Home Living Family/patient expects to be discharged to:: Skilled nursing facility Living Arrangements: Spouse/significant other Available Help at Discharge: Family;Available 24 hours/day   Home Access: Stairs to enter   Entrance Stairs-Number of Steps: 1 Home Layout: One level Home Equipment: None Additional Comments: pt was at [facility] following injury while awaiting sx.     Prior Function Level of Independence: Needs assistance   Gait / Transfers Assistance Needed: independent prior to injury     Comments: since injury.  Performed sliding board transfers to w/c.  assist for adls     Hand Dominance        Extremity/Trunk Assessment   Upper Extremity Assessment: Overall WFL for tasks assessed           Lower Extremity Assessment: LLE deficits/detail   LLE Deficits / Details: SLR 3/5, knee ext +3/5, can wiggle toes, sensation intact to light touch at toes  Cervical / Trunk Assessment: Normal  Communication   Communication: No difficulties  Cognition Arousal/Alertness: Awake/alert Behavior During Therapy: WFL for tasks assessed/performed Overall Cognitive Status: Within Functional Limits for tasks assessed                      General  Comments      Exercises     Assessment/Plan    PT Assessment Patient needs continued PT services  PT Problem List Decreased activity tolerance;Pain;Decreased knowledge of use of DME;Decreased mobility;Decreased balance          PT Treatment Interventions DME instruction;Gait training;Functional mobility training;Balance training;Therapeutic exercise;Therapeutic activities;Patient/family education    PT Goals (Current goals can be found in the  Care Plan section)  Acute Rehab PT Goals Patient Stated Goal: return to independence; recover quickly PT Goal Formulation: With patient Time For Goal Achievement: 03/15/16 Potential to Achieve Goals: Good    Frequency Min 4X/week   Barriers to discharge        Co-evaluation PT/OT/SLP Co-Evaluation/Treatment: Yes Reason for Co-Treatment: Complexity of the patient's impairments (multi-system involvement);For patient/therapist safety PT goals addressed during session: Mobility/safety with mobility OT goals addressed during session: ADL's and self-care       End of Session Equipment Utilized During Treatment: Gait belt Activity Tolerance: Patient limited by fatigue Patient left: in chair;with call bell/phone within reach;with chair alarm set Nurse Communication: Mobility status         Time: 1000-1026 PT Time Calculation (min) (ACUTE ONLY): 26 min   Charges:   PT Evaluation $PT Eval Low Complexity: 1 Procedure     PT G Codes:        Philomena Doheny 03/01/2016, 11:44 AM [phone]

## 2016-03-02 LAB — GLUCOSE, CAPILLARY
GLUCOSE-CAPILLARY: 123 mg/dL — AB (ref 65–99)
GLUCOSE-CAPILLARY: 199 mg/dL — AB (ref 65–99)
Glucose-Capillary: 200 mg/dL — ABNORMAL HIGH (ref 65–99)

## 2016-03-02 MED ORDER — ASPIRIN EC 325 MG PO TBEC: 325.0000 mg | DELAYED_RELEASE_TABLET | Freq: Two times a day (BID) | ORAL | 0 refills | Status: AC

## 2016-03-02 MED ORDER — ONDANSETRON HCL 4 MG/2ML IJ SOLN: 4.0000 mg | Freq: Four times a day (QID) | INTRAMUSCULAR | Status: DC | PRN

## 2016-03-02 MED ORDER — POLYETHYLENE GLYCOL 3350 17 G PO PACK
17.0000 g | PACK | Freq: Every day | ORAL | Status: DC
  Administered 2016-03-02: 17 g via ORAL
  Filled 2016-03-02: qty 1

## 2016-03-02 NOTE — Progress Notes (Signed)
Occupational Therapy Treatment Patient Details Name: Nathan Proctor. MRN: [id number] DOB: [birth year] Today's Date: 03/02/2016    History of present illness s/p L ORIF for ankle fx   OT comments  Pt unable to safely perform lateral transfer to L side (slightly uphill).  Used maximove to return to bed.    Follow Up Recommendations  SNF    Equipment Recommendations  3 in 1 bedside comode    Recommendations for Other Services      Precautions / Restrictions Precautions Precautions: Fall Precaution Comments: L ankle fracture Restrictions Weight Bearing Restrictions: Yes LLE Weight Bearing: Non weight bearing Other Position/Activity Restrictions: LLE       Mobility Bed Mobility Overal bed mobility: Needs Assistance Bed Mobility: Sit to Supine Rolling: Min assist         Transfers Overall transfer level: Needs assistance             General transfer comment: see ADL section above    Balance                                   ADL                                         General ADL Comments: Attempted lateral scoot transfer back to bed.  It was slightly uphill and had to transfer to L (involved side) due to chair only lowers on that side.  Unable to safely accomplish this with max +2 assistance.  Pt was leaning backwards when trying to scoot over, despite cues.   changed to [facility] lift for safety.  Pt able laterally shift in chair for lift pad to be placed between legs.  Rolled to bil sides with min A to remove pad once in bed.  Bed was changed out due to locked out      Vision                     Perception     Praxis      Cognition   Behavior During Therapy: [facility] for tasks assessed/performed Overall Cognitive Status:  (had difficulty following cue to not lean backwards)                       Extremity/Trunk Assessment               Exercises    Shoulder Instructions       General  Comments      Pertinent Vitals/ Pain       Pain Assessment: Faces Pain Score: 4  Faces Pain Scale: Hurts even more Pain Location: L ankle Pain Descriptors / Indicators: Sore;Discomfort Pain Intervention(s): Limited activity within patient's tolerance;Monitored during session;Premedicated before session;Repositioned;Ice applied  Home Living                                          Prior Functioning/Environment              Frequency           Progress Toward Goals  OT Goals(current goals can now be found in the care plan section)  Progress towards OT goals: Not progressing toward goals -  comment (will reassess next session)     Plan      Co-evaluation                 End of Session     Activity Tolerance Patient limited by fatigue;Patient limited by pain   Patient Left in bed;with call bell/phone within reach;with bed alarm set (pt requests 4 rails up)   Nurse Communication          Time: [id number] OT Time Calculation (min): 38 min  Charges: OT General Charges $OT Visit: 1 Procedure OT Treatments $Therapeutic Activity: 23-37 mins  Leyan Branden 03/02/2016, 2:26 PM  Lesle Chris, OTR/L [phone] 03/02/2016

## 2016-03-02 NOTE — Progress Notes (Signed)
Physical Therapy Treatment Patient Details Name: Nathan Proctor. MRN: [id number] DOB: August 06, 1940 Today's Date: 03/02/2016    History of Present Illness s/p L ORIF for ankle fx    PT Comments    Patient was seen in bed upon arrival with family present. Performed AROM and AAROM  L   LE exercises in bed x 10 reps. Patient was able to recall exercises with verbal cuing. Performed supine to sit x minA for supporting LLE. Patient required increased time to perform bed mobility with assistance from bed rails. Performed lateral scoot from the bed to the recliner x modA+2.  VC's required for safe hand placement and to lean forward to gain momentum and push up through UE and increase weight bearing through RLE. Family was present during treatment and was educated on how to perform exercises and safe transfers. Patient is limited by weakness, decreased activity tolerance, and decreased endurance. Patient will be D/C to a SNF.   Follow Up Recommendations  SNF     Equipment Recommendations       Recommendations for Other Services       Precautions / Restrictions Precautions Precautions: Fall Precaution Comments: L ankle fracture Restrictions Weight Bearing Restrictions: Yes LLE Weight Bearing: Non weight bearing Other Position/Activity Restrictions: LLE    Mobility  Bed Mobility Overal bed mobility: Needs Assistance Bed Mobility: Sit to Supine     Supine to sit: Min assist     General bed mobility comments: Required minA for supporting LLE. Required increased time to perform bed mobility.   Transfers Overall transfer level: Needs assistance   Transfers: Lateral/Scoot Transfers          Lateral/Scoot Transfers: Mod assist;+2 physical assistance General transfer comment: Required increased time and modA to safely lateral scoot from the bed to the recliner. VC's required for safe hand placement and to lean forward in order to gain momentum and push up through UE and increase  weight through RLE.   Ambulation/Gait                 Stairs            Wheelchair Mobility    Modified Rankin (Stroke Patients Only)       Balance                                    Cognition                            Exercises General Exercises - Lower Extremity Quad Sets: AROM;Left;10 reps;Supine Gluteal Sets: AROM;Left;Supine;10 reps Short Arc Quad: 10 reps;AROM;Supine;Left Hip ABduction/ADduction: Left;AAROM;10 reps;Supine Straight Leg Raises: AAROM;Left;10 reps;Supine    General Comments        Pertinent Vitals/Pain Pain Assessment: Faces Pain Score: 4  Faces Pain Scale: Hurts little more Pain Location: L ankle Pain Descriptors / Indicators: Discomfort Pain Intervention(s): Limited activity within patient's tolerance;Monitored during session;Repositioned    Home Living                      Prior Function            PT Goals (current goals can now be found in the care plan section) Progress towards PT goals: Progressing toward goals    Frequency    Min 4X/week      PT Plan Current plan remains appropriate  Co-evaluation             End of Session   Activity Tolerance: Patient limited by fatigue;No increased pain Patient left: with call bell/phone within reach;with family/visitor present;in chair     Time:  - 11:05 - 11:50    Charges:   1 te  2 ta                      G Codes:      Caitlin Medlin, SPTA [facility] Acute Rehab [phone]  Present during session and agree with above  Rica Koyanagi  PTA [facility]  Acute  Rehab Pager      [phone]

## 2016-03-02 NOTE — Discharge Summary (Addendum)
Patient ID: Nathan L Bowlby Jr. MRN: [id number] DOB/AGE: 07/21/1940 75 y.o.  Admit date: 02/29/2016 Discharge date:   Primary Diagnosis: Left ankle fracture with syndesmosis injury   Admission Diagnoses:  Past Medical History:  Diagnosis Date  . Anemia of chronic disease   . Ankle fracture 02/15/2016  . Cancer (HCC)    Prostate  . Chronic constipation   . Chronic kidney disease    stage III  . Diabetes mellitus without complication (HCC)    type II   . Diabetic peripheral neuropathy (HCC)   . Failure to thrive (0-17)   . Fracture of left lower leg   . Gout   . Hyperlipidemia   . Hypertension   . Morbid obesity (HCC)   . Unstable gait    Discharge Diagnoses:   Active Problems:   Ankle fracture, left   Closed left ankle fracture  Estimated body mass index is 49.54 kg/m as calculated from the following:   Height as of this encounter: 5' 10" (1.778 m).   Weight as of this encounter: 156.6 kg (345 lb 4 oz).  Procedure:  Procedure(s) (LRB): OPEN REDUCTION INTERNAL FIXATION (ORIF) ANKLE FRACTURE (Left)   Consults: None  HPI: [name] is a 75 y.o.-year-old male who sustained a left ankle fracture; he was indicated for open reduction and internal fixation due to the displaced nature of the mortise and syndesmotic injury and came to the operating room today for this procedure. Laboratory Data: Admission on 02/29/2016  Component Date Value Ref Range Status  . WBC 02/29/2016 8.0  4.0 - 10.5 K/uL Final  . RBC 02/29/2016 3.69* 4.22 - 5.81 MIL/uL Final  . Hemoglobin 02/29/2016 10.9* 13.0 - 17.0 g/dL Final  . HCT 02/29/2016 34.9* 39.0 - 52.0 % Final  . MCV 02/29/2016 94.6  78.0 - 100.0 fL Final  . MCH 02/29/2016 29.5  26.0 - 34.0 pg Final  . MCHC 02/29/2016 31.2  30.0 - 36.0 g/dL Final  . RDW 02/29/2016 14.7  11.5 - 15.5 % Final  . Platelets 02/29/2016 264  150 - 400 K/uL Final  . Glucose-Capillary 02/29/2016 103* 65 - 99 mg/dL Final  . Glucose-Capillary 02/29/2016  134* 65 - 99 mg/dL Final  . WBC 02/29/2016 8.9  4.0 - 10.5 K/uL Final  . RBC 02/29/2016 3.68* 4.22 - 5.81 MIL/uL Final  . Hemoglobin 02/29/2016 10.8* 13.0 - 17.0 g/dL Final  . HCT 02/29/2016 34.5* 39.0 - 52.0 % Final  . MCV 02/29/2016 93.8  78.0 - 100.0 fL Final  . MCH 02/29/2016 29.3  26.0 - 34.0 pg Final  . MCHC 02/29/2016 31.3  30.0 - 36.0 g/dL Final  . RDW 02/29/2016 14.6  11.5 - 15.5 % Final  . Platelets 02/29/2016 252  150 - 400 K/uL Final  . Creatinine, Ser 02/29/2016 1.42* 0.61 - 1.24 mg/dL Final  . GFR calc non Af Amer 02/29/2016 46* >60 mL/min Final  . GFR calc Af Amer 02/29/2016 54* >60 mL/min Final   Comment: (NOTE) The eGFR has been calculated using the CKD EPI equation. This calculation has not been validated in all clinical situations. eGFR's persistently <60 mL/min signify possible Chronic Kidney Disease.   . Glucose-Capillary 02/29/2016 155* 65 - 99 mg/dL Final  . Glucose-Capillary 02/29/2016 187* 65 - 99 mg/dL Final  . Glucose-Capillary 02/29/2016 218* 65 - 99 mg/dL Final  . Glucose-Capillary 03/01/2016 190* 65 - 99 mg/dL Final  . MRSA, PCR 03/01/2016 NEGATIVE  NEGATIVE Final  . Staphylococcus aureus 03/01/2016 NEGATIVE  NEGATIVE   Final   Comment:        The Xpert SA Assay (FDA approved for NASAL specimens in patients over 21 years of age), is one component of a comprehensive surveillance program.  Test performance has been validated by [facility] for patients greater than or equal to 1 year old. It is not intended to diagnose infection nor to guide or monitor treatment.   . Glucose-Capillary 03/01/2016 224* 65 - 99 mg/dL Final  . Glucose-Capillary 03/01/2016 176* 65 - 99 mg/dL Final  . Glucose-Capillary 03/01/2016 139* 65 - 99 mg/dL Final  . Glucose-Capillary 03/02/2016 123* 65 - 99 mg/dL Final  . Glucose-Capillary 03/02/2016 199* 65 - 99 mg/dL Final  Nursing Home on 02/23/2016  Component Date Value Ref Range Status  . Hemoglobin 02/21/2016 9.8* 13.5 -  17.5 g/dL Final  . HCT 02/21/2016 32* 41 - 53 % Final  . Neutrophils Absolute 02/21/2016 5  /L Final  . Platelets 02/21/2016 254  150 - 399 K/L Final  . WBC 02/21/2016 7.5  10^3/mL Final  . Glucose 02/21/2016 142  mg/dL Final  . BUN 02/21/2016 18  4 - 21 mg/dL Final  . Creatinine 02/21/2016 1.4* 0.6 - 1.3 mg/dL Final  . Potassium 02/21/2016 4.4  3.4 - 5.3 mmol/L Final  . Sodium 02/21/2016 146  137 - 147 mmol/L Final  . Alkaline Phosphatase 02/21/2016 76  25 - 125 U/L Final  . ALT 02/21/2016 11  10 - 40 U/L Final  . AST 02/21/2016 12* 14 - 40 U/L Final  . Bilirubin, Total 02/21/2016 0.5  mg/dL Final  . Hemoglobin A1C 02/21/2016 6.5   Final  Admission on 02/15/2016, Discharged on 02/16/2016  Component Date Value Ref Range Status  . Glucose-Capillary 02/15/2016 45* 65 - 99 mg/dL Final  . WBC 02/15/2016 12.0* 4.0 - 10.5 K/uL Final  . RBC 02/15/2016 3.65* 4.22 - 5.81 MIL/uL Final  . Hemoglobin 02/15/2016 10.9* 13.0 - 17.0 g/dL Final  . HCT 02/15/2016 35.3* 39.0 - 52.0 % Final  . MCV 02/15/2016 96.7  78.0 - 100.0 fL Final  . MCH 02/15/2016 29.9  26.0 - 34.0 pg Final  . MCHC 02/15/2016 30.9  30.0 - 36.0 g/dL Final  . RDW 02/15/2016 14.9  11.5 - 15.5 % Final  . Platelets 02/15/2016 233  150 - 400 K/uL Final  . Glucose-Capillary 02/15/2016 55* 65 - 99 mg/dL Final  . Sodium 02/15/2016 144  135 - 145 mmol/L Final  . Potassium 02/15/2016 4.2  3.5 - 5.1 mmol/L Final  . Chloride 02/15/2016 108  101 - 111 mmol/L Final  . CO2 02/15/2016 26  22 - 32 mmol/L Final  . Glucose, Bld 02/15/2016 58* 65 - 99 mg/dL Final  . BUN 02/15/2016 25* 6 - 20 mg/dL Final  . Creatinine, Ser 02/15/2016 1.56* 0.61 - 1.24 mg/dL Final  . Calcium 02/15/2016 10.0  8.9 - 10.3 mg/dL Final  . GFR calc non Af Amer 02/15/2016 41* >60 mL/min Final  . GFR calc Af Amer 02/15/2016 48* >60 mL/min Final   Comment: (NOTE) The eGFR has been calculated using the CKD EPI equation. This calculation has not been validated in all  clinical situations. eGFR's persistently <60 mL/min signify possible Chronic Kidney Disease.   . Anion gap 02/15/2016 10  5 - 15 Final  . Glucose-Capillary 02/15/2016 61* 65 - 99 mg/dL Final  . Glucose-Capillary 02/15/2016 162* 65 - 99 mg/dL Final  . Glucose-Capillary 02/15/2016 189* 65 - 99 mg/dL Final  . Glucose-Capillary 02/15/2016   213* 65 - 99 mg/dL Final  . Comment 1 02/15/2016 Notify RN   Final  . Comment 2 02/15/2016 Document in Chart   Final  . Glucose-Capillary 02/16/2016 88  65 - 99 mg/dL Final     X-Rays:Dg Tibia/fibula Left  Result Date: 02/15/2016 CLINICAL DATA:  Stepped out of his truck and rolled his ankle earlier today, pain throughout ankle and proximal LEFT foot radiating proximally throughout LEFT lower leg and LEFT knee, initial encounter EXAM: LEFT TIBIA AND FIBULA - 2 VIEW COMPARISON:  04/03/2004 FINDINGS: Osseous demineralization. Joint spaces preserved. Oblique nondisplaced fracture proximal fibular diaphysis. No additional fracture, dislocation, or bone destruction. Increased atherosclerotic calcifications since previous exam. Soft tissue swelling at ankle. IMPRESSION: Nondisplaced oblique proximal LEFT fibular diaphyseal fracture. Electronically Signed   By: Lavonia Dana M.D.   On: 02/15/2016 15:40   Dg Ankle Complete Left  Result Date: 02/29/2016 CLINICAL DATA:  Open reduction internal fixation for instability EXAM: LEFT ANKLE COMPLETE - 3+ VIEW COMPARISON:  February 15, 2016 FLUOROSCOPY TIME:  0 minutes 24 seconds; 4 submitted images FINDINGS: Frontal, oblique and lateral views obtained. There is screw and plate fixation through the distal tibiofibular syndesmosis with alignment anatomic in appearance. There is evidence of a fracture of the posterior distal tibia with alignment near anatomic. Ankle mortise appears grossly intact on submitted images. IMPRESSION: Screw and plate fixation through the distal tibiofibular syndesmosis. Ankle mortise appears grossly intact.  Fracture along the posterior most aspect of the distal tibia with alignment near anatomic. Electronically Signed   By: Lowella Grip III M.D.   On: 02/29/2016 10:11   Dg Ankle Complete Left  Result Date: 02/15/2016 CLINICAL DATA:  Stepped out of his truck and rolled his ankle earlier today, pain throughout ankle and proximal LEFT foot radiating proximally throughout LEFT lower leg and LEFT knee, initial encounter EXAM: LEFT ANKLE COMPLETE - 3+ VIEW COMPARISON:  None FINDINGS: Marked widening of the medial joint line on AP view, appears reduced on oblique view, consistent with ligamentous disruption. Bones appear slightly demineralized. No definite fracture or gross dislocation seen. Large Achilles insertion calcaneal spur. IMPRESSION: Marked widening of the medial ankle joint on AP view reduced on oblique and lateral views, consistent with medial ligamentous injury and subluxation. No acute fracture or dislocation identified. Electronically Signed   By: Lavonia Dana M.D.   On: 02/15/2016 15:39   Dg Knee Complete 4 Views Left  Result Date: 02/15/2016 CLINICAL DATA:  Stepped out of his truck and rolled his ankle earlier today, pain throughout ankle and proximal LEFT foot radiating proximally throughout LEFT lower leg and LEFT knee, initial encounter EXAM: LEFT KNEE - COMPLETE 4+ VIEW COMPARISON:  LEFT knee radiographs 04/03/2004 FINDINGS: Osseous demineralization. Medial compartment joint space narrowing. Oblique nondisplaced fracture of proximal LEFT fibular diaphysis. No additional fracture, dislocation or bone destruction. No knee joint effusion. Patellar spur at quadriceps tendon insertion. Atherosclerotic calcification aorta. IMPRESSION: Oblique nondisplaced fracture of proximal LEFT fibular diaphysis. Electronically Signed   By: Lavonia Dana M.D.   On: 02/15/2016 15:42   Dg Foot Complete Left  Result Date: 02/15/2016 CLINICAL DATA:  Stepped out of his truck and rolled his ankle earlier today,  pain throughout ankle and proximal LEFT foot radiating proximally throughout LEFT lower leg and LEFT knee, initial encounter EXAM: LEFT FOOT - COMPLETE 3+ VIEW COMPARISON:  None FINDINGS: Osseous demineralization. Joint spaces preserved. No acute fracture, dislocation, or bone destruction. Soft tissue swelling at ankle. Large Achilles insertion calcaneal spur.  Scattered small vessel vascular calcifications at ankle. IMPRESSION: No acute osseous abnormalities. Electronically Signed   By: Mark  Boles M.D.   On: 02/15/2016 15:35   Dg C-arm 1-60 Min-no Report  Result Date: 02/29/2016 CLINICAL DATA: surgery elective C-ARM 1-60 MINUTES Fluoroscopy was utilized by the requesting physician.  No radiographic interpretation.    EKG: Orders placed or performed during the hospital encounter of 02/29/16  . EKG 12 lead  . EKG 12 lead  . EKG 12-Lead  . EKG 12-Lead     Hospital Course: Nathan L Craney Jr. is a 75 y.o. who was admitted to [facility]. They were brought to the operating room on 02/29/2016 and underwent Procedure(s): OPEN REDUCTION INTERNAL FIXATION (ORIF) ANKLE FRACTURE.  Patient tolerated the procedure well and was later transferred to the recovery room and then to the orthopaedic floor for postoperative care.  They were given PO and IV analgesics for pain control following their surgery.  They were given 24 hours of postoperative antibiotics of  Anti-infectives    Start     Dose/Rate Route Frequency Ordered Stop   02/29/16 0600  ceFAZolin (ANCEF) 3 g in dextrose 5 % 50 mL IVPB     3 g 130 mL/hr over 30 Minutes Intravenous On call to O.R. 02/28/16 1315 02/29/16 0844     and started on DVT prophylaxis in the form of Lovenox.   PT and OT were ordered for mobility.  Discharge planning consulted to help with postop disposition and equipment needs.  Patient had a fair night on the evening of surgery.  They started to get up OOB with therapy on day one.   We worked on pain control and  bowel movements while inpatient.  He worked with therapy and was determined best to return to [facility] for more inpatient care.   Diet: Diabetic diet Activity:NWB Follow-up:in 2 weeks Disposition - Skilled nursing facility Discharged Condition: good    Medication List    STOP taking these medications   enoxaparin 40 MG/0.4ML injection Commonly known as:  LOVENOX     TAKE these medications   acetaminophen 500 MG tablet Commonly known as:  TYLENOL Take 1,000 mg by mouth every 8 (eight) hours as needed for mild pain.   allopurinol 100 MG tablet Commonly known as:  ZYLOPRIM Take 100 mg by mouth daily.   amLODipine 10 MG tablet Commonly known as:  NORVASC Take 10 mg by mouth daily.   aspirin EC 325 MG tablet Take 1 tablet (325 mg total) by mouth 2 (two) times daily.   atorvastatin 40 MG tablet Commonly known as:  LIPITOR Take 40 mg by mouth daily.   carvedilol 25 MG tablet Commonly known as:  COREG Take 25 mg by mouth 2 (two) times daily with a meal.   gabapentin 300 MG capsule Commonly known as:  NEURONTIN Take 300 mg by mouth daily.   HYDROcodone-acetaminophen 5-325 MG tablet Commonly known as:  NORCO/VICODIN Take 1 tablet by mouth every 8 (eight) hours as needed for moderate pain.   insulin lispro protamine-lispro (75-25) 100 UNIT/ML Susp injection Commonly known as:  HUMALOG 75/25 MIX Inject 60 Units into the skin daily with breakfast.   lubiprostone 24 MCG capsule Commonly known as:  AMITIZA Take 24 mcg by mouth 2 (two) times daily with a meal.   multivitamin with minerals Tabs tablet Take 1 tablet by mouth daily.   Vitamin D (Ergocalciferol) 50000 units Caps capsule Commonly known as:  DRISDOL Take 50,000 Units   by mouth 2 (two) times a week. On Wed and Fri per MAR.       Discharge Instructions    Call MD / Call 911    Complete by:  As directed    If you experience chest pain or shortness of breath, CALL 911 and be transported to the hospital  emergency room.  If you develope a fever above 101 F, pus (white drainage) or increased drainage or redness at the wound, or calf pain, call your surgeon's office.   Constipation Prevention    Complete by:  As directed    Drink plenty of fluids.  Prune juice may be helpful.  You may use a stool softener, such as Colace (over the counter) 100 mg twice a day.  Use MiraLax (over the counter) for constipation as needed.   Diet - low sodium heart healthy    Complete by:  As directed    Discharge instructions    Complete by:  As directed    - No weight bearing to left lower extremity -keep left leg elevated when able -maintain dressing clean and dry until follow up appointment with surgeon in 2 weeks -take aspirin 325 mg bid for 4 weeks for dvt protection -pain control with tylenol as first line then would use tramadol for moderate pain and plain oxycodone there after for breakthrough.   Driving restrictions    Complete by:  As directed    No driving for 6 weeks   Increase activity slowly as tolerated    Complete by:  As directed      Patient was recently discharged from hospital and all medications have been reviewed.   Follow-up Information    Jason Patrick Rogers, MD Follow up in 2 week(s).   Specialty:  Orthopedic Surgery Contact information: [address] [place] [address] [phone]           Signed: Jason P Rogers, MD Orthopaedic Surgery 03/02/2016, 2:35 PM       

## 2016-03-02 NOTE — Progress Notes (Addendum)
CSW assisting with d/c planning. Spouse and an additional caregiver will work with PT OT this am to see if spouse / caregiver can manage pt's care at home. [facility] has contacted CSW reporting that Bernadene Person has provided authorization for SNF placement. MD contacted and updated. Pt is ready for d/c today. CSW will contact MD with finalized d/c plan. CSW will continue to follow to assist with d/c planning needs.  Werner Lean LCSW [id number]  12:12 Pt is unable to return home safely. PT OT recommends SNF. [facility] is able to admit pt today. [employer] authorization in place for SNF. Pt / spouse are in agreement with d/c to [facility] today. MD has been contacted with update. [facility] transport is needed. Medical necessity form completed. D/C Summary sent to SNF for review. # for report provided to nsg.  Werner Lean LCSW [phone]

## 2016-03-02 NOTE — Progress Notes (Signed)
   Subjective:  Patient reports pain as moderate.  Elevating.  Having flatus but no Bm, and also having some nausea.  Objective:   VITALS:   Vitals:   03/01/16 1000 03/01/16 1330 03/01/16 2309 03/02/16 0544  BP: (!) 122/97 (!) 147/85 132/66 (!) 141/80  Pulse: 91 87 88 87  Resp: 18 18 16 16   Temp: 98.6 F (37 C) 97.7 F (36.5 C) 98.1 F (36.7 C) 98.6 F (37 C)  TempSrc: Oral Axillary Oral Oral  SpO2: 98% 98% 94% 96%  Weight:      Height:        LLE short leg splint in place, good repair.  +wiggles toes, +SILT DP/SP and skin wwp   Lab Results  Component Value Date   WBC 8.9 02/29/2016   HGB 10.8 (L) 02/29/2016   HCT 34.5 (L) 02/29/2016   MCV 93.8 02/29/2016   PLT 252 02/29/2016   BMET    Component Value Date/Time   NA 146 02/21/2016   K 4.4 02/21/2016   CL 108 02/15/2016 1541   CO2 26 02/15/2016 1541   GLUCOSE 58 (L) 02/15/2016 1541   BUN 18 02/21/2016   CREATININE 1.42 (H) 02/29/2016 1311   CALCIUM 10.0 02/15/2016 1541   GFRNONAA 46 (L) 02/29/2016 1311   GFRAA 54 (L) 02/29/2016 1311     Assessment/Plan: 2 Days Post-Op   Active Problems:   Ankle fracture, left   Closed left ankle fracture  -NWB LLE -PT/OT -elevation  -will continue to work on pain control as inpatient -SSI with DM diet for DM -adding zofran and Miralax to help with nausea and constipaion -will change ou splint to fracture boot today, due to my concerns of pressure on plantar foot from plaster material -dc planning   Nathan Proctor 03/02/2016, 8:53 AM   Geralynn Rile, MD [phone]

## 2016-03-05 ENCOUNTER — Encounter: Payer: Self-pay | Admitting: Adult Health

## 2016-03-05 ENCOUNTER — Non-Acute Institutional Stay (SKILLED_NURSING_FACILITY): Payer: Medicare HMO | Admitting: Adult Health

## 2016-03-05 DIAGNOSIS — I10 Essential (primary) hypertension: Secondary | ICD-10-CM | POA: Diagnosis not present

## 2016-03-05 DIAGNOSIS — D62 Acute posthemorrhagic anemia: Secondary | ICD-10-CM

## 2016-03-05 DIAGNOSIS — R2681 Unsteadiness on feet: Secondary | ICD-10-CM

## 2016-03-05 DIAGNOSIS — E559 Vitamin D deficiency, unspecified: Secondary | ICD-10-CM

## 2016-03-05 DIAGNOSIS — N183 Chronic kidney disease, stage 3 unspecified: Secondary | ICD-10-CM

## 2016-03-05 DIAGNOSIS — M109 Gout, unspecified: Secondary | ICD-10-CM

## 2016-03-05 DIAGNOSIS — E785 Hyperlipidemia, unspecified: Secondary | ICD-10-CM

## 2016-03-05 DIAGNOSIS — K5909 Other constipation: Secondary | ICD-10-CM | POA: Diagnosis not present

## 2016-03-05 DIAGNOSIS — S82892S Other fracture of left lower leg, sequela: Secondary | ICD-10-CM

## 2016-03-05 DIAGNOSIS — E1122 Type 2 diabetes mellitus with diabetic chronic kidney disease: Secondary | ICD-10-CM

## 2016-03-05 DIAGNOSIS — E8809 Other disorders of plasma-protein metabolism, not elsewhere classified: Secondary | ICD-10-CM

## 2016-03-05 DIAGNOSIS — G629 Polyneuropathy, unspecified: Secondary | ICD-10-CM | POA: Diagnosis not present

## 2016-03-05 NOTE — Progress Notes (Signed)
Patient ID: Nathan Proctor., male   DOB: 10-16-1940, 75 y.o.   MRN: [id number]    DATE:  03/05/2016   MRN:  [id number]  BIRTHDAY: 1940-12-12  Facility:  Nursing Home Location:  [place] Room Number: [id number]  LEVEL OF CARE:  SNF [id number])  Contact Information    Name Relation Home Work [place] [phone]  [phone]       Code Status History    Date Active Date Inactive Code Status Order ID Comments User Context   02/29/2016 11:43 AM 03/02/2016 10:29 PM Full Code ID:[id number]  Nicholes Stairs, MD Inpatient       Chief Complaint  Patient presents with  . Hospitalization Follow-up    HISTORY OF PRESENT ILLNESS:  This is a 75 year old male who has been re-admitted to [facility] on 03/02/16 hospitalization 02/29/16 to 03/02/16. He had a fall and sustained a left ankle fracture. He was 1st admitted to [facility] on 10/20 then transferred to the hospital for surgery on 02/29/16. He had ORIF of left ankle.  He has been admitted for a short-term rehabilitation.   PAST MEDICAL HISTORY:  Past Medical History:  Diagnosis Date  . Anemia of chronic disease   . Ankle fracture 02/15/2016  . Cancer [facility])    Prostate  . Chronic constipation   . Chronic kidney disease    stage III  . Diabetes mellitus without complication ([place])    type II   . Diabetic peripheral neuropathy ([place])   . Failure to thrive (0-17)   . Fracture of left lower leg   . Gout   . Hyperlipidemia   . Hypertension   . Morbid obesity ([place])   . Unstable gait      CURRENT MEDICATIONS: Reviewed  Patient's Medications  New Prescriptions   No medications on file  Previous Medications   ACETAMINOPHEN (TYLENOL) 500 MG TABLET    Take 1,000 mg by mouth every 8 (eight) hours as needed for mild pain.   ALLOPURINOL (ZYLOPRIM) 100 MG TABLET    Take 100 mg by mouth daily.    AMLODIPINE (NORVASC) 10 MG TABLET    Take 10 mg by mouth daily.     ASPIRIN EC 325 MG TABLET    Take 1 tablet (325 mg total) by mouth 2 (two) times daily.   ATORVASTATIN (LIPITOR) 40 MG TABLET    Take 40 mg by mouth daily.    CARVEDILOL (COREG) 25 MG TABLET    Take 25 mg by mouth 2 (two) times daily with a meal.    DOCUSATE SODIUM (COLACE) 100 MG CAPSULE    Take 100 mg by mouth 2 (two) times daily.   GABAPENTIN (NEURONTIN) 300 MG CAPSULE    Take 300 mg by mouth daily.    HYDROCODONE-ACETAMINOPHEN (NORCO/VICODIN) 5-325 MG TABLET    Take 1 tablet by mouth every 8 (eight) hours as needed for moderate pain.   INSULIN LISPRO PROTAMINE-LISPRO (HUMALOG 75/25 MIX) (75-25) 100 UNIT/ML SUSP INJECTION    Inject 60 Units into the skin daily with breakfast.   LUBIPROSTONE (AMITIZA) 24 MCG CAPSULE    Take 24 mcg by mouth 2 (two) times daily with a meal.    MULTIPLE VITAMIN (MULTIVITAMIN WITH MINERALS) TABS TABLET    Take 1 tablet by mouth daily.   POLYETHYLENE GLYCOL (MIRALAX / GLYCOLAX) PACKET    Take 17 g by mouth daily as needed for mild constipation.   VITAMIN  D, ERGOCALCIFEROL, (DRISDOL) 50000 UNITS CAPS CAPSULE    Take 50,000 Units by mouth 2 (two) times a week. On Wed and Fri per [facility].  Modified Medications   No medications on file  Discontinued Medications   No medications on file     No Known Allergies   REVIEW OF SYSTEMS:  GENERAL: no change in appetite, no fatigue, no weight changes, no fever, chills or weakness EYES: Denies change in vision, dry eyes, eye pain, itching or discharge EARS: Denies change in hearing, ringing in ears, or earache NOSE: Denies nasal congestion or epistaxis MOUTH and THROAT: Denies oral discomfort, gingival pain or bleeding, pain from teeth or hoarseness   RESPIRATORY: no cough, SOB, DOE, wheezing, hemoptysis CARDIAC: no chest pain, edema or palpitations GI: no abdominal pain, diarrhea, constipation, heart burn, nausea or vomiting GU: Denies dysuria, frequency, hematuria, incontinence, or discharge PSYCHIATRIC: Denies feeling of  depression or anxiety. No report of hallucinations, insomnia, paranoia, or agitation   PHYSICAL EXAMINATION  GENERAL APPEARANCE: Well nourished. In no acute distress. Normal body habitus SKIN:  Left ankle surgical site is covered with ACE wrap HEAD: Normal in size and contour. No evidence of trauma EYES: Lids open and close normally. No blepharitis, entropion or ectropion. PERRL. Conjunctivae are clear and sclerae are white. Lenses are without opacity EARS: Pinnae are normal. Patient hears normal voice tunes of the examiner MOUTH and THROAT: Lips are without lesions. Oral mucosa is moist and without lesions. Tongue is normal in shape, size, and color and without lesions NECK: supple, trachea midline, no neck masses, no thyroid tenderness, no thyromegaly LYMPHATICS: no LAN in the neck, no supraclavicular LAN RESPIRATORY: breathing is even & unlabored, BS CTAB CARDIAC: RRR, no murmur,no extra heart sounds, no edema GI: abdomen soft, normal BS, no masses, no tenderness, no hepatomegaly, no splenomegaly EXTREMITIES:  Able to move X 4 extremities: Left foot with CAM walker PSYCHIATRIC: Alert and oriented X 3. Affect and behavior are appropriate  LABS/RADIOLOGY: Labs reviewed: Basic Metabolic Panel:  Recent Labs  02/15/16 1541 02/21/16 02/29/16 1311  NA 144 146  --   K 4.2 4.4  --   CL 108  --   --   CO2 26  --   --   GLUCOSE 58*  --   --   BUN 25* 18  --   CREATININE 1.56* 1.4* 1.42*  CALCIUM 10.0  --   --    Liver Function Tests:  Recent Labs  02/21/16  AST 12*  ALT 11  ALKPHOS 76   CBC:  Recent Labs  02/15/16 1541 02/21/16 02/29/16 0754 02/29/16 1311  WBC 12.0* 7.5 8.0 8.9  NEUTROABS  --  5  --   --   HGB 10.9* 9.8* 10.9* 10.8*  HCT 35.3* 32* 34.9* 34.5*  MCV 96.7  --  94.6 93.8  PLT 233 254 264 252    CBG:  Recent Labs  03/02/16 0702 03/02/16 1205 03/02/16 1706  GLUCAP 123* 199* 200*      Dg Tibia/fibula Left  Result Date: 02/15/2016 CLINICAL  DATA:  Stepped out of his truck and rolled his ankle earlier today, pain throughout ankle and proximal LEFT foot radiating proximally throughout LEFT lower leg and LEFT knee, initial encounter EXAM: LEFT TIBIA AND FIBULA - 2 VIEW COMPARISON:  04/03/2004 FINDINGS: Osseous demineralization. Joint spaces preserved. Oblique nondisplaced fracture proximal fibular diaphysis. No additional fracture, dislocation, or bone destruction. Increased atherosclerotic calcifications since previous exam. Soft tissue swelling at ankle. IMPRESSION: Nondisplaced  oblique proximal LEFT fibular diaphyseal fracture. Electronically Signed   By: Lavonia Dana M.D.   On: 02/15/2016 15:40   Dg Ankle Complete Left  Result Date: 02/29/2016 CLINICAL DATA:  Open reduction internal fixation for instability EXAM: LEFT ANKLE COMPLETE - 3+ VIEW COMPARISON:  February 15, 2016 FLUOROSCOPY TIME:  0 minutes 24 seconds; 4 submitted images FINDINGS: Frontal, oblique and lateral views obtained. There is screw and plate fixation through the distal tibiofibular syndesmosis with alignment anatomic in appearance. There is evidence of a fracture of the posterior distal tibia with alignment near anatomic. Ankle mortise appears grossly intact on submitted images. IMPRESSION: Screw and plate fixation through the distal tibiofibular syndesmosis. Ankle mortise appears grossly intact. Fracture along the posterior most aspect of the distal tibia with alignment near anatomic. Electronically Signed   By: Lowella Grip III M.D.   On: 02/29/2016 10:11   Dg Ankle Complete Left  Result Date: 02/15/2016 CLINICAL DATA:  Stepped out of his truck and rolled his ankle earlier today, pain throughout ankle and proximal LEFT foot radiating proximally throughout LEFT lower leg and LEFT knee, initial encounter EXAM: LEFT ANKLE COMPLETE - 3+ VIEW COMPARISON:  None FINDINGS: Marked widening of the medial joint line on AP view, appears reduced on oblique view, consistent with  ligamentous disruption. Bones appear slightly demineralized. No definite fracture or gross dislocation seen. Large Achilles insertion calcaneal spur. IMPRESSION: Marked widening of the medial ankle joint on AP view reduced on oblique and lateral views, consistent with medial ligamentous injury and subluxation. No acute fracture or dislocation identified. Electronically Signed   By: Lavonia Dana M.D.   On: 02/15/2016 15:39   Dg Knee Complete 4 Views Left  Result Date: 02/15/2016 CLINICAL DATA:  Stepped out of his truck and rolled his ankle earlier today, pain throughout ankle and proximal LEFT foot radiating proximally throughout LEFT lower leg and LEFT knee, initial encounter EXAM: LEFT KNEE - COMPLETE 4+ VIEW COMPARISON:  LEFT knee radiographs 04/03/2004 FINDINGS: Osseous demineralization. Medial compartment joint space narrowing. Oblique nondisplaced fracture of proximal LEFT fibular diaphysis. No additional fracture, dislocation or bone destruction. No knee joint effusion. Patellar spur at quadriceps tendon insertion. Atherosclerotic calcification aorta. IMPRESSION: Oblique nondisplaced fracture of proximal LEFT fibular diaphysis. Electronically Signed   By: Lavonia Dana M.D.   On: 02/15/2016 15:42   Dg Foot Complete Left  Result Date: 02/15/2016 CLINICAL DATA:  Stepped out of his truck and rolled his ankle earlier today, pain throughout ankle and proximal LEFT foot radiating proximally throughout LEFT lower leg and LEFT knee, initial encounter EXAM: LEFT FOOT - COMPLETE 3+ VIEW COMPARISON:  None FINDINGS: Osseous demineralization. Joint spaces preserved. No acute fracture, dislocation, or bone destruction. Soft tissue swelling at ankle. Large Achilles insertion calcaneal spur. Scattered small vessel vascular calcifications at ankle. IMPRESSION: No acute osseous abnormalities. Electronically Signed   By: Lavonia Dana M.D.   On: 02/15/2016 15:35   Dg C-arm 1-60 Min-no Report  Result Date:  02/29/2016 CLINICAL DATA: surgery elective C-ARM 1-60 MINUTES Fluoroscopy was utilized by the requesting physician.  No radiographic interpretation.    ASSESSMENT/PLAN:  Unsteady gait - for rehabilitation, PT and OT, for therapeutic strengthening exercises; fall precaution  Left ankle fracture S/P ORIF - for rehabilitation, PT and OT, for therapeutic strengthening exercises; LLE NWB; follow-up with orthopedic surgeon, Dr.Patrick Stann [place], in 2 weeks; continue ASA 325 mg 1 tab PO BID X 4 weeks; Tylenol 500 mg 2 tabs = 1,000 mg PO Q 8 hours  PRN and Norco 5/325 mg 1 tab PO Q 8 hours PRN for pain  Gout - continue Allopurinol 100 mg 1 tab PO Q D  Hypertension - continue Norvasc 10 mg 1 tab PO Q D and Coreg 25 mg 1 tab PO BID; check BMP  Hyperlipidemia - continue Atorvastatin 40 mg 1 tab PO Q D  Neuropathy - continue Neurontin 300 mg 1 capsule PO Q D  Diabetes Mellitus, type 2 - continue Humalog 75/25 60 units SQ daily Lab Results  Component Value Date   HGBA1C 6.5 02/21/2016    Chronic constipation - continue Amitiza 24 mcg 1 capsule PO BID  Vitamin D deficiency - continue Vitamin D2 Ergocalciferol 50,000 units 1 capsule PO 2X/week (Wed and Fri)  Anemia, acute blood loss - re-check CBC Lab Results  Component Value Date   HGB 10.8 (L) 02/29/2016   Hypoalbuminemia - albumin 3.34; RD consult    Goals of care:  Short-term rehabilitation    Durenda Age, NP [place] [phone]

## 2016-03-06 ENCOUNTER — Encounter: Payer: Self-pay | Admitting: Internal Medicine

## 2016-03-06 ENCOUNTER — Non-Acute Institutional Stay (SKILLED_NURSING_FACILITY): Payer: Medicare HMO | Admitting: Internal Medicine

## 2016-03-06 DIAGNOSIS — D62 Acute posthemorrhagic anemia: Secondary | ICD-10-CM | POA: Diagnosis not present

## 2016-03-06 DIAGNOSIS — E785 Hyperlipidemia, unspecified: Secondary | ICD-10-CM | POA: Diagnosis not present

## 2016-03-06 DIAGNOSIS — Z794 Long term (current) use of insulin: Secondary | ICD-10-CM | POA: Diagnosis not present

## 2016-03-06 DIAGNOSIS — S82892S Other fracture of left lower leg, sequela: Secondary | ICD-10-CM

## 2016-03-06 DIAGNOSIS — I10 Essential (primary) hypertension: Secondary | ICD-10-CM

## 2016-03-06 DIAGNOSIS — E1143 Type 2 diabetes mellitus with diabetic autonomic (poly)neuropathy: Secondary | ICD-10-CM

## 2016-03-06 DIAGNOSIS — R2681 Unsteadiness on feet: Secondary | ICD-10-CM | POA: Diagnosis not present

## 2016-03-06 DIAGNOSIS — K5901 Slow transit constipation: Secondary | ICD-10-CM | POA: Diagnosis not present

## 2016-03-06 LAB — BASIC METABOLIC PANEL
BUN: 20 mg/dL (ref 4–21)
CREATININE: 1.2 mg/dL (ref 0.6–1.3)
Glucose: 141 mg/dL
Potassium: 4.3 mmol/L (ref 3.4–5.3)
SODIUM: 146 mmol/L (ref 137–147)

## 2016-03-06 LAB — CBC AND DIFFERENTIAL
HCT: 33 % — AB (ref 41–53)
HEMOGLOBIN: 10.1 g/dL — AB (ref 13.5–17.5)
Neutrophils Absolute: 5 /uL
Platelets: 257 10*3/uL (ref 150–399)
WBC: 7.6 10^3/mL

## 2016-03-06 NOTE — Progress Notes (Signed)
LOCATION: [place]  PCP: Foye Spurling, MD   Code Status: Full Code  Goals of care: Advanced Directive information Advanced Directives 02/29/2016  Does patient have an advance directive? No  Copy of advanced directive(s) in chart? -  Would patient like information on creating an advanced directive? No - patient declined information       Extended Emergency Contact Information Primary Emergency Contact: [place] Address: [address]          [place], [place] [address] [name] of [place] Phone: [phone] Mobile Phone: [phone] Relation: Spouse   No Known Allergies  Chief Complaint  Patient presents with  . Readmit To SNF    Readmission Visit     HPI:  Patient is a 75 y.o. male seen today for short term rehabilitation post hospital re-admission from 02/29/2016-03/02/2016 for open reduction and internal fixation of his left ankle fracture. Of note, he was here for rehabilitation with left ankle closed fracture and fracture of left proximal end of fibula and was undergoing rehabilitation while awaiting surgery. He is seen in his room today.  Review of Systems:  Constitutional: Negative for fever, chills, diaphoresis.  HENT: Negative for headache, congestion, nasal discharge, sore throat, difficulty swallowing.   Eyes: Negative for double vision and discharge.  Respiratory: Negative for cough and wheezing. Positive for shortness of breath with exertion.  Cardiovascular: Negative for chest pain, palpitations, leg swelling.  Gastrointestinal: Negative for heartburn, nausea, vomiting, abdominal pain. Last bowel movement was yesterday  Genitourinary: Negative for dysuria Musculoskeletal: Negative for back pain, fall in the facility.  Skin: Negative for itching, rash.  Neurological: Negative for dizziness.    Past Medical History:  Diagnosis Date  . Anemia of chronic disease   . Ankle fracture 02/15/2016  . Cancer [facility])    Prostate  . Chronic constipation   . Chronic kidney disease    stage III  . Diabetes mellitus without complication ([place])    type II   . Diabetic peripheral neuropathy ([place])   . Failure to thrive (0-17)   . Fracture of left lower leg   . Gout   . Hyperlipidemia   . Hypertension   . Morbid obesity ([place])   . Unstable gait    Past Surgical History:  Procedure Laterality Date  . ORIF ANKLE FRACTURE Left 02/29/2016   Procedure: OPEN REDUCTION INTERNAL FIXATION (ORIF) ANKLE FRACTURE;  Surgeon: Nicholes Stairs, MD;  Location: [facility];  Service: Orthopedics;  Laterality: Left;  . PROSTATE SURGERY     Social History:   reports that he has never smoked. He has never used smokeless tobacco. He reports that he does not drink alcohol or use drugs.  No family history on file.  Medications:   Medication List       Accurate as of 03/06/16  2:54 PM. Always use your most recent med list.          acetaminophen 500 MG tablet Commonly known as:  TYLENOL Take 1,000 mg by mouth every 8 (eight) hours as needed for mild pain.   allopurinol 100 MG tablet Commonly known as:  ZYLOPRIM Take 100 mg by mouth daily.   amLODipine 10 MG tablet Commonly known as:  NORVASC Take 10 mg by mouth daily.   aspirin EC 325 MG tablet Take 1 tablet (325 mg total) by mouth 2 (two) times daily.   atorvastatin 40 MG tablet Commonly known as:  LIPITOR Take 40 mg by mouth daily.  carvedilol 25 MG tablet Commonly known as:  COREG Take 25 mg by mouth 2 (two) times daily with a meal.   docusate sodium 100 MG capsule Commonly known as:  COLACE Take 100 mg by mouth 2 (two) times daily.   gabapentin 300 MG capsule Commonly known as:  NEURONTIN Take 300 mg by mouth daily.   HYDROcodone-acetaminophen 5-325 MG tablet Commonly known as:  NORCO/VICODIN Take 1 tablet by mouth every 8 (eight) hours as needed for moderate pain.   insulin lispro protamine-lispro (75-25) 100 UNIT/ML Susp injection Commonly  known as:  HUMALOG 75/25 MIX Inject 60 Units into the skin daily with breakfast.   lubiprostone 24 MCG capsule Commonly known as:  AMITIZA Take 24 mcg by mouth 2 (two) times daily with a meal.   multivitamin with minerals Tabs tablet Take 1 tablet by mouth daily.   polyethylene glycol packet Commonly known as:  MIRALAX / GLYCOLAX Take 17 g by mouth daily as needed for mild constipation.   Vitamin D (Ergocalciferol) 50000 units Caps capsule Commonly known as:  DRISDOL Take 50,000 Units by mouth 2 (two) times a week. On Wed and Fri per [facility].       Immunizations:  There is no immunization history on file for this patient.   Physical Exam: Vitals:   03/06/16 1450  BP: (!) 158/88  Pulse: 86  Resp: 18  Temp: 97.6 F (36.4 C)  TempSrc: Oral  SpO2: 93%  Weight: (!) 350 lb (158.8 kg)  Height: 5\' 10"  (1.778 m)   Body mass index is 50.22 kg/m.  General- elderly Morbidly obese male, in no acute distress Head- normocephalic, atraumatic Throat- moist mucus membrane Eyes- PERRLA, EOMI, no pallor, no icterus Neck- no cervical lymphadenopathy Cardiovascular- normal s1,s2, no murmur Respiratory- bilateral clear to auscultation, no wheeze, no rhonchi, no crackles, no use of accessory muscles Abdomen- bowel sounds present, soft, non tender Musculoskeletal- able to move all 4 extremities, limited left ankle range of motion, Ace wrap and brace on left ankle, able to move his toes with good capillary refill present Neurological- alert and oriented to person, place and time Skin- warm and dry Psychiatry- normal mood and affect    Labs reviewed: Basic Metabolic Panel:  Recent Labs  02/15/16 1541 02/21/16 02/29/16 1311  NA 144 146  --   K 4.2 4.4  --   CL 108  --   --   CO2 26  --   --   GLUCOSE 58*  --   --   BUN 25* 18  --   CREATININE 1.56* 1.4* 1.42*  CALCIUM 10.0  --   --    Liver Function Tests:  Recent Labs  02/21/16  AST 12*  ALT 11  ALKPHOS 76   No  results for input(s): LIPASE, AMYLASE in the last 8760 hours. No results for input(s): AMMONIA in the last 8760 hours. CBC:  Recent Labs  02/15/16 1541 02/21/16 02/29/16 0754 02/29/16 1311  WBC 12.0* 7.5 8.0 8.9  NEUTROABS  --  5  --   --   HGB 10.9* 9.8* 10.9* 10.8*  HCT 35.3* 32* 34.9* 34.5*  MCV 96.7  --  94.6 93.8  PLT 233 254 264 252   Cardiac Enzymes: No results for input(s): CKTOTAL, CKMB, CKMBINDEX, TROPONINI in the last 8760 hours. BNP: Invalid input(s): POCBNP CBG:  Recent Labs  03/02/16 0702 03/02/16 1205 03/02/16 1706  GLUCAP 123* 199* 200*    Radiological Exams: Dg Tibia/fibula Left  Result Date:  02/15/2016 CLINICAL DATA:  Stepped out of his truck and rolled his ankle earlier today, pain throughout ankle and proximal LEFT foot radiating proximally throughout LEFT lower leg and LEFT knee, initial encounter EXAM: LEFT TIBIA AND FIBULA - 2 VIEW COMPARISON:  04/03/2004 FINDINGS: Osseous demineralization. Joint spaces preserved. Oblique nondisplaced fracture proximal fibular diaphysis. No additional fracture, dislocation, or bone destruction. Increased atherosclerotic calcifications since previous exam. Soft tissue swelling at ankle. IMPRESSION: Nondisplaced oblique proximal LEFT fibular diaphyseal fracture. Electronically Signed   By: Lavonia Dana M.D.   On: 02/15/2016 15:40   Dg Ankle Complete Left  Result Date: 02/29/2016 CLINICAL DATA:  Open reduction internal fixation for instability EXAM: LEFT ANKLE COMPLETE - 3+ VIEW COMPARISON:  February 15, 2016 FLUOROSCOPY TIME:  0 minutes 24 seconds; 4 submitted images FINDINGS: Frontal, oblique and lateral views obtained. There is screw and plate fixation through the distal tibiofibular syndesmosis with alignment anatomic in appearance. There is evidence of a fracture of the posterior distal tibia with alignment near anatomic. Ankle mortise appears grossly intact on submitted images. IMPRESSION: Screw and plate fixation through  the distal tibiofibular syndesmosis. Ankle mortise appears grossly intact. Fracture along the posterior most aspect of the distal tibia with alignment near anatomic. Electronically Signed   By: Lowella Grip III M.D.   On: 02/29/2016 10:11   Dg Ankle Complete Left  Result Date: 02/15/2016 CLINICAL DATA:  Stepped out of his truck and rolled his ankle earlier today, pain throughout ankle and proximal LEFT foot radiating proximally throughout LEFT lower leg and LEFT knee, initial encounter EXAM: LEFT ANKLE COMPLETE - 3+ VIEW COMPARISON:  None FINDINGS: Marked widening of the medial joint line on AP view, appears reduced on oblique view, consistent with ligamentous disruption. Bones appear slightly demineralized. No definite fracture or gross dislocation seen. Large Achilles insertion calcaneal spur. IMPRESSION: Marked widening of the medial ankle joint on AP view reduced on oblique and lateral views, consistent with medial ligamentous injury and subluxation. No acute fracture or dislocation identified. Electronically Signed   By: Lavonia Dana M.D.   On: 02/15/2016 15:39   Dg Knee Complete 4 Views Left  Result Date: 02/15/2016 CLINICAL DATA:  Stepped out of his truck and rolled his ankle earlier today, pain throughout ankle and proximal LEFT foot radiating proximally throughout LEFT lower leg and LEFT knee, initial encounter EXAM: LEFT KNEE - COMPLETE 4+ VIEW COMPARISON:  LEFT knee radiographs 04/03/2004 FINDINGS: Osseous demineralization. Medial compartment joint space narrowing. Oblique nondisplaced fracture of proximal LEFT fibular diaphysis. No additional fracture, dislocation or bone destruction. No knee joint effusion. Patellar spur at quadriceps tendon insertion. Atherosclerotic calcification aorta. IMPRESSION: Oblique nondisplaced fracture of proximal LEFT fibular diaphysis. Electronically Signed   By: Lavonia Dana M.D.   On: 02/15/2016 15:42   Dg Foot Complete Left  Result Date:  02/15/2016 CLINICAL DATA:  Stepped out of his truck and rolled his ankle earlier today, pain throughout ankle and proximal LEFT foot radiating proximally throughout LEFT lower leg and LEFT knee, initial encounter EXAM: LEFT FOOT - COMPLETE 3+ VIEW COMPARISON:  None FINDINGS: Osseous demineralization. Joint spaces preserved. No acute fracture, dislocation, or bone destruction. Soft tissue swelling at ankle. Large Achilles insertion calcaneal spur. Scattered small vessel vascular calcifications at ankle. IMPRESSION: No acute osseous abnormalities. Electronically Signed   By: Lavonia Dana M.D.   On: 02/15/2016 15:35   Dg C-arm 1-60 Min-no Report  Result Date: 02/29/2016 CLINICAL DATA: surgery elective C-ARM 1-60 MINUTES Fluoroscopy was utilized by  the requesting physician.  No radiographic interpretation.    Assessment/Plan  Unsteady gait With left ankle fracture. Here for rehabilitation. Will have him work with physical therapy and occupational therapy on gait balance and transfer  Left ankle fracture Status post open reduction and internal fixation. Has follow-up with orthopedic. Nonweightbearing to left lower extremity for now. Continue Norco 5-3 25 mg every 8*as needed for pain along with Tylenol on a needed basis. Continue aspirin enteric-coated 325 mg twice a day for DVT prophylaxis until first of December 2017. Will need for him to work with physical therapy and occupational therapy on strength training, safety transfers and balance. Fall precautions to be taken.  Slow transit constipation On Colace 100 mg twice a day, Amitiza and MiraLAX daily as needed for now. Monitor clinically.  Acute blood loss anemia Post surgery, monitor CBC  Type 2 diabetes mellitus with neuropathy Continue Humalog 75-25 mix current regimen. Continue gabapentin.  Hypertension Monitor blood pressure readings. Continue amlodipine and Coreg.  Hyperlipidemia Continue Lipitor.     Goals of care: short term  rehabilitation   Labs/tests ordered: cbc, bmp  Family/ staff Communication: reviewed care plan with patient and nursing supervisor    Blanchie Serve, MD Internal Medicine [place], [place] [address] Cell Phone (Monday-Friday 8 am - 5 pm): [phone] On Call: [phone] and follow prompts after 5 pm and on weekends Office Phone: [phone] Office Fax: [phone]

## 2016-03-19 ENCOUNTER — Non-Acute Institutional Stay (SKILLED_NURSING_FACILITY): Payer: Medicare HMO | Admitting: Adult Health

## 2016-03-19 ENCOUNTER — Encounter: Payer: Self-pay | Admitting: Adult Health

## 2016-03-19 DIAGNOSIS — Z794 Long term (current) use of insulin: Secondary | ICD-10-CM | POA: Diagnosis not present

## 2016-03-19 DIAGNOSIS — R2681 Unsteadiness on feet: Secondary | ICD-10-CM | POA: Diagnosis not present

## 2016-03-19 DIAGNOSIS — K5909 Other constipation: Secondary | ICD-10-CM | POA: Diagnosis not present

## 2016-03-19 DIAGNOSIS — E785 Hyperlipidemia, unspecified: Secondary | ICD-10-CM | POA: Diagnosis not present

## 2016-03-19 DIAGNOSIS — I10 Essential (primary) hypertension: Secondary | ICD-10-CM

## 2016-03-19 DIAGNOSIS — G629 Polyneuropathy, unspecified: Secondary | ICD-10-CM

## 2016-03-19 DIAGNOSIS — E1143 Type 2 diabetes mellitus with diabetic autonomic (poly)neuropathy: Secondary | ICD-10-CM

## 2016-03-19 DIAGNOSIS — D62 Acute posthemorrhagic anemia: Secondary | ICD-10-CM

## 2016-03-19 DIAGNOSIS — M109 Gout, unspecified: Secondary | ICD-10-CM | POA: Diagnosis not present

## 2016-03-19 DIAGNOSIS — S82892S Other fracture of left lower leg, sequela: Secondary | ICD-10-CM

## 2016-03-19 DIAGNOSIS — E559 Vitamin D deficiency, unspecified: Secondary | ICD-10-CM

## 2016-03-19 NOTE — Progress Notes (Signed)
DATE:  03/19/2016   MRN:  [id number]  BIRTHDAY: 12/16/1940  Facility:  Nursing Home Location:  [place] Room Number: [id number]  LEVEL OF CARE:  SNF [phone]  Contact Information    Name Relation Home Work [place] [phone]  [phone]       Code Status History    Date Active Date Inactive Code Status Order ID Comments User Context   02/29/2016 11:43 AM 03/02/2016 10:29 PM Full Code ID:[id number]  Nicholes Stairs, MD Inpatient       Chief Complaint  Patient presents with  . Discharge Note    HISTORY OF PRESENT ILLNESS:  This is a 75 year old male who is for discharge home on 03/21/16 with home health PT, OT and Nursing.  He has been re-admitted to [facility] on 03/02/16. He had a fall and sustained a left ankle fracture. He was first admitted to [facility] and rehabilitation on 02/17/16 then transferred to hospital for surgery on 02/29/16 and had ORIF of left ankle. He was readmitted on 03/02/16 from [facility] admission 02/29/16-03/02/16.  Patient was admitted to this facility for short-term rehabilitation after the patient's recent hospitalization.  Patient has completed SNF rehabilitation and therapy has cleared the patient for discharge.   PAST MEDICAL HISTORY:  Past Medical History:  Diagnosis Date  . Anemia of chronic disease   . Ankle fracture 02/15/2016  . Cancer [facility])    Prostate  . Chronic constipation   . Chronic kidney disease    stage III  . Diabetes mellitus without complication ([place])    type II   . Diabetic peripheral neuropathy ([place])   . Failure to thrive (0-17)   . Fracture of left lower leg   . Gout   . Hyperlipidemia   . Hypertension   . Morbid obesity ([place])   . Unstable gait      CURRENT MEDICATIONS: Reviewed  Patient's Medications  New Prescriptions   No medications on file  Previous Medications   ACETAMINOPHEN (TYLENOL) 500 MG TABLET    Take  1,000 mg by mouth every 8 (eight) hours as needed for mild pain.   ALLOPURINOL (ZYLOPRIM) 100 MG TABLET    Take 100 mg by mouth daily.    AMLODIPINE (NORVASC) 10 MG TABLET    Take 10 mg by mouth daily.    ASPIRIN EC 325 MG TABLET    Take 1 tablet (325 mg total) by mouth 2 (two) times daily.   ATORVASTATIN (LIPITOR) 40 MG TABLET    Take 40 mg by mouth daily.    CARVEDILOL (COREG) 25 MG TABLET    Take 25 mg by mouth 2 (two) times daily with a meal.    DOCUSATE SODIUM (COLACE) 100 MG CAPSULE    Take 100 mg by mouth 2 (two) times daily.   GABAPENTIN (NEURONTIN) 300 MG CAPSULE    Take 300 mg by mouth daily.    HYDROCODONE-ACETAMINOPHEN (NORCO/VICODIN) 5-325 MG TABLET    Take 1 tablet by mouth every 8 (eight) hours as needed for moderate pain.   INSULIN LISPRO PROTAMINE-LISPRO (HUMALOG 75/25 MIX) (75-25) 100 UNIT/ML SUSP INJECTION    Inject 60 Units into the skin daily with breakfast.   LUBIPROSTONE (AMITIZA) 24 MCG CAPSULE    Take 24 mcg by mouth 2 (two) times daily with a meal.    MULTIPLE VITAMIN (MULTIVITAMIN WITH MINERALS) TABS TABLET    Take 1 tablet by  mouth daily.   POLYETHYLENE GLYCOL (MIRALAX / GLYCOLAX) PACKET    Take 17 g by mouth daily as needed for mild constipation.   VITAMIN D, ERGOCALCIFEROL, (DRISDOL) 50000 UNITS CAPS CAPSULE    Take 50,000 Units by mouth 2 (two) times a week. On Wed and Fri per [facility].  Modified Medications   No medications on file  Discontinued Medications   No medications on file     No Known Allergies   REVIEW OF SYSTEMS:  GENERAL: no change in appetite, no fatigue, no weight changes, no fever, chills or weakness EYES: Denies change in vision, dry eyes, eye pain, itching or discharge EARS: Denies change in hearing, ringing in ears, or earache NOSE: Denies nasal congestion or epistaxis MOUTH and THROAT: Denies oral discomfort, gingival pain or bleeding, pain from teeth or hoarseness   RESPIRATORY: no cough, SOB, DOE, wheezing, hemoptysis CARDIAC: no chest  pain, edema or palpitations GI: no abdominal pain, diarrhea, constipation, heart burn, nausea or vomiting GU: Denies dysuria, frequency, hematuria, incontinence, or discharge PSYCHIATRIC: Denies feeling of depression or anxiety. No report of hallucinations, insomnia, paranoia, or agitation     PHYSICAL EXAMINATION  GENERAL APPEARANCE: Well nourished. In no acute distress. Morbidly obese SKIN:  Left lateral heel surgical site is healed, has CAM boot HEAD: Normal in size and contour. No evidence of trauma EYES: Lids open and close normally. No blepharitis, entropion or ectropion. PERRL. Conjunctivae are clear and sclerae are white. Lenses are without opacity EARS: Pinnae are normal. Patient hears normal voice tunes of the examiner MOUTH and THROAT: Lips are without lesions. Oral mucosa is moist and without lesions. Tongue is normal in shape, size, and color and without lesions NECK: supple, trachea midline, no neck masses, no thyroid tenderness, no thyromegaly LYMPHATICS: no LAN in the neck, no supraclavicular LAN RESPIRATORY: breathing is even & unlabored, BS CTAB CARDIAC: RRR, no murmur,no extra heart sounds, no edema GI: abdomen soft, normal BS, no masses, no tenderness, no hepatomegaly, no splenomegaly EXTREMITIES:  Able to move 4 extremities PSYCHIATRIC: Alert and oriented X 3. Affect and behavior are appropriate   LABS/RADIOLOGY: Labs reviewed: Basic Metabolic Panel:  Recent Labs  02/15/16 1541 02/21/16 02/29/16 1311 03/06/16  NA 144 146  --  146  K 4.2 4.4  --  4.3  CL 108  --   --   --   CO2 26  --   --   --   GLUCOSE 58*  --   --   --   BUN 25* 18  --  20  CREATININE 1.56* 1.4* 1.42* 1.2  CALCIUM 10.0  --   --   --    Liver Function Tests:  Recent Labs  02/21/16  AST 12*  ALT 11  ALKPHOS 76   CBC:  Recent Labs  02/15/16 1541 02/21/16 02/29/16 0754 02/29/16 1311 03/06/16  WBC 12.0* 7.5 8.0 8.9 7.6  NEUTROABS  --  5  --   --  5  HGB 10.9* 9.8* 10.9*  10.8* 10.1*  HCT 35.3* 32* 34.9* 34.5* 33*  MCV 96.7  --  94.6 93.8  --   PLT 233 254 264 252 257   CBG:  Recent Labs  03/02/16 0702 03/02/16 1205 03/02/16 1706  GLUCAP 123* 199* 200*    Dg Ankle Complete Left  Result Date: 02/29/2016 CLINICAL DATA:  Open reduction internal fixation for instability EXAM: LEFT ANKLE COMPLETE - 3+ VIEW COMPARISON:  February 15, 2016 FLUOROSCOPY TIME:  0 minutes 24  seconds; 4 submitted images FINDINGS: Frontal, oblique and lateral views obtained. There is screw and plate fixation through the distal tibiofibular syndesmosis with alignment anatomic in appearance. There is evidence of a fracture of the posterior distal tibia with alignment near anatomic. Ankle mortise appears grossly intact on submitted images. IMPRESSION: Screw and plate fixation through the distal tibiofibular syndesmosis. Ankle mortise appears grossly intact. Fracture along the posterior most aspect of the distal tibia with alignment near anatomic. Electronically Signed   By: Lowella Grip III M.D.   On: 02/29/2016 10:11   Dg C-arm 1-60 Min-no Report  Result Date: 02/29/2016 CLINICAL DATA: surgery elective C-ARM 1-60 MINUTES Fluoroscopy was utilized by the requesting physician.  No radiographic interpretation.    ASSESSMENT/PLAN:  1. Unsteady gait - for home health and OT for therapeutic strengthening exercises; fall precautions   2. Closed fracture of left ankle, sequela - for home health PT and OT, for therapeutic strengthening exercises; follow-up with her to surgeon, Dr. Campbell Lerner; NWB LLE; continue Norco 5-325 mg 1 tab by mouth every 8 hours when necessary and acetaminophen 500 mg 2 tabs = 1000 mg by mouth every 8 hours when necessary for pain; aspirin 325 mg 1 tab by mouth twice a day  to 03/30/16 for DVT prophylaxis   3. Gout, unspecified cause, unspecified chronicity, unspecified site - continue allopurinol 100 mg 1 tab by mouth daily   4. Acute blood loss anemia - hgb  10.1, stable   5. Type 2 diabetes mellitus with diabetic autonomic neuropathy, with long-term current use of insulin (HCC) - continue Humalog mix 75-25 60 units subcutaneous daily with breakfast Lab Results  Component Value Date   HGBA1C 6.5 02/21/2016     6. Chronic constipation - continue MiraLAX 17 g by mouth daily when necessary, Colace 100 mg 1 capsule by mouth twice a day and Amitiza 24 g 1 capsule by mouth twice a day   7. Essential hypertension - well-controlled; continue amlodipine 10 mg 1 tab by mouth daily and Coreg 25 mg 1 tab by mouth twice a day   8. Vitamin D deficiency - continue vitamin D2 50,000 units by mouth 2 times/week   9. Neuropathy - continue gabapentin 300 mg 1 capsule by mouth daily   10. Hyperlipidemia, unspecified hyperlipidemia type - continue Lipitor 40 mg 1 tab by mouth daily      I have filled out patient's discharge paperwork and written prescriptions.  Patient will receive home health PT, OT, Nursing and CNA.  DME provided:  Semi-electric hospital bed, heavy duty drop arm bariatric bedside commode, heavy duty wheelchair, cushion, elevating leg rests, anti-tippers, break extensions and removable armrest  Total discharge time: Greater than 30 minutes Greater than 50% was spent in counseling and coordination of care.  Discharge time involved coordination of the discharge process with social worker, nursing staff and therapy department. Medical justification for home health services/DME verified.     Demeisha Geraghty C. [place] - NP [employer] [phone]

## 2016-03-24 DIAGNOSIS — E1142 Type 2 diabetes mellitus with diabetic polyneuropathy: Secondary | ICD-10-CM | POA: Diagnosis not present

## 2016-03-24 DIAGNOSIS — N183 Chronic kidney disease, stage 3 (moderate): Secondary | ICD-10-CM | POA: Diagnosis not present

## 2016-03-24 DIAGNOSIS — R269 Unspecified abnormalities of gait and mobility: Secondary | ICD-10-CM | POA: Diagnosis not present

## 2016-03-24 DIAGNOSIS — E1122 Type 2 diabetes mellitus with diabetic chronic kidney disease: Secondary | ICD-10-CM | POA: Diagnosis not present

## 2016-03-24 DIAGNOSIS — R531 Weakness: Secondary | ICD-10-CM | POA: Diagnosis not present

## 2016-03-24 DIAGNOSIS — E785 Hyperlipidemia, unspecified: Secondary | ICD-10-CM | POA: Diagnosis not present

## 2016-03-24 DIAGNOSIS — M109 Gout, unspecified: Secondary | ICD-10-CM | POA: Diagnosis not present

## 2016-03-24 DIAGNOSIS — S82892D Other fracture of left lower leg, subsequent encounter for closed fracture with routine healing: Secondary | ICD-10-CM | POA: Diagnosis not present

## 2016-03-24 DIAGNOSIS — I129 Hypertensive chronic kidney disease with stage 1 through stage 4 chronic kidney disease, or unspecified chronic kidney disease: Secondary | ICD-10-CM | POA: Diagnosis not present

## 2016-04-04 ENCOUNTER — Emergency Department (HOSPITAL_COMMUNITY): Payer: Medicare HMO

## 2016-04-04 ENCOUNTER — Encounter (HOSPITAL_COMMUNITY): Payer: Self-pay

## 2016-04-04 ENCOUNTER — Emergency Department (HOSPITAL_COMMUNITY)
Admission: EM | Admit: 2016-04-04 | Discharge: 2016-04-04 | Disposition: A | Discharge status: Home / Self Care | Payer: Medicare HMO | Attending: Emergency Medicine | Admitting: Emergency Medicine

## 2016-04-04 DIAGNOSIS — I129 Hypertensive chronic kidney disease with stage 1 through stage 4 chronic kidney disease, or unspecified chronic kidney disease: Secondary | ICD-10-CM | POA: Diagnosis not present

## 2016-04-04 DIAGNOSIS — E860 Dehydration: Secondary | ICD-10-CM

## 2016-04-04 DIAGNOSIS — E1140 Type 2 diabetes mellitus with diabetic neuropathy, unspecified: Secondary | ICD-10-CM | POA: Diagnosis not present

## 2016-04-04 DIAGNOSIS — Z79899 Other long term (current) drug therapy: Secondary | ICD-10-CM | POA: Insufficient documentation

## 2016-04-04 DIAGNOSIS — N183 Chronic kidney disease, stage 3 (moderate): Secondary | ICD-10-CM | POA: Diagnosis not present

## 2016-04-04 DIAGNOSIS — E1122 Type 2 diabetes mellitus with diabetic chronic kidney disease: Secondary | ICD-10-CM | POA: Insufficient documentation

## 2016-04-04 DIAGNOSIS — Z794 Long term (current) use of insulin: Secondary | ICD-10-CM | POA: Diagnosis not present

## 2016-04-04 DIAGNOSIS — R55 Syncope and collapse: Secondary | ICD-10-CM | POA: Diagnosis present

## 2016-04-04 DIAGNOSIS — Z8546 Personal history of malignant neoplasm of prostate: Secondary | ICD-10-CM | POA: Diagnosis not present

## 2016-04-04 LAB — CBC WITH DIFFERENTIAL/PLATELET
Basophils Absolute: 0 10*3/uL (ref 0.0–0.1)
Basophils Relative: 0 %
Eosinophils Absolute: 0.2 10*3/uL (ref 0.0–0.7)
Eosinophils Relative: 2 %
HEMATOCRIT: 36.3 % — AB (ref 39.0–52.0)
HEMOGLOBIN: 11.6 g/dL — AB (ref 13.0–17.0)
LYMPHS ABS: 1.6 10*3/uL (ref 0.7–4.0)
LYMPHS PCT: 21 %
MCH: 29.2 pg (ref 26.0–34.0)
MCHC: 32 g/dL (ref 30.0–36.0)
MCV: 91.4 fL (ref 78.0–100.0)
MONOS PCT: 8 %
Monocytes Absolute: 0.6 10*3/uL (ref 0.1–1.0)
NEUTROS ABS: 5.3 10*3/uL (ref 1.7–7.7)
NEUTROS PCT: 69 %
Platelets: 239 10*3/uL (ref 150–400)
RBC: 3.97 MIL/uL — AB (ref 4.22–5.81)
RDW: 14.6 % (ref 11.5–15.5)
WBC: 7.7 10*3/uL (ref 4.0–10.5)

## 2016-04-04 LAB — URINALYSIS, ROUTINE W REFLEX MICROSCOPIC
BILIRUBIN URINE: NEGATIVE
Glucose, UA: NEGATIVE mg/dL
HGB URINE DIPSTICK: NEGATIVE
Ketones, ur: NEGATIVE mg/dL
Leukocytes, UA: NEGATIVE
Nitrite: NEGATIVE
PH: 6 (ref 5.0–8.0)
Protein, ur: NEGATIVE mg/dL
SPECIFIC GRAVITY, URINE: 1.015 (ref 1.005–1.030)

## 2016-04-04 LAB — BASIC METABOLIC PANEL
Anion gap: 9 (ref 5–15)
BUN: 21 mg/dL — AB (ref 6–20)
CHLORIDE: 105 mmol/L (ref 101–111)
CO2: 28 mmol/L (ref 22–32)
CREATININE: 1.48 mg/dL — AB (ref 0.61–1.24)
Calcium: 10 mg/dL (ref 8.9–10.3)
GFR calc Af Amer: 51 mL/min — ABNORMAL LOW (ref >60)
GFR calc non Af Amer: 44 mL/min — ABNORMAL LOW (ref >60)
Glucose, Bld: 75 mg/dL (ref 65–99)
POTASSIUM: 3.8 mmol/L (ref 3.5–5.1)
SODIUM: 142 mmol/L (ref 135–145)

## 2016-04-04 LAB — CBG MONITORING, ED: GLUCOSE-CAPILLARY: 83 mg/dL (ref 65–99)

## 2016-04-04 LAB — HEPATIC FUNCTION PANEL
ALK PHOS: 73 U/L (ref 38–126)
ALT: 15 U/L — AB (ref 17–63)
AST: 20 U/L (ref 15–41)
Albumin: 3.6 g/dL (ref 3.5–5.0)
BILIRUBIN INDIRECT: 0.5 mg/dL (ref 0.3–0.9)
Bilirubin, Direct: 0.1 mg/dL (ref 0.1–0.5)
TOTAL PROTEIN: 6.8 g/dL (ref 6.5–8.1)
Total Bilirubin: 0.6 mg/dL (ref 0.3–1.2)

## 2016-04-04 LAB — D-DIMER, QUANTITATIVE (NOT AT ARMC): D DIMER QUANT: 1.94 ug{FEU}/mL — AB (ref 0.00–0.50)

## 2016-04-04 IMAGING — DX DG CHEST 2V
2 series · 2 of 2 positions shown · non-contrast
Comparison: [DATE].

CLINICAL DATA: Diaphoresis.

EXAM:
CHEST  2 VIEW

[w chest lat]
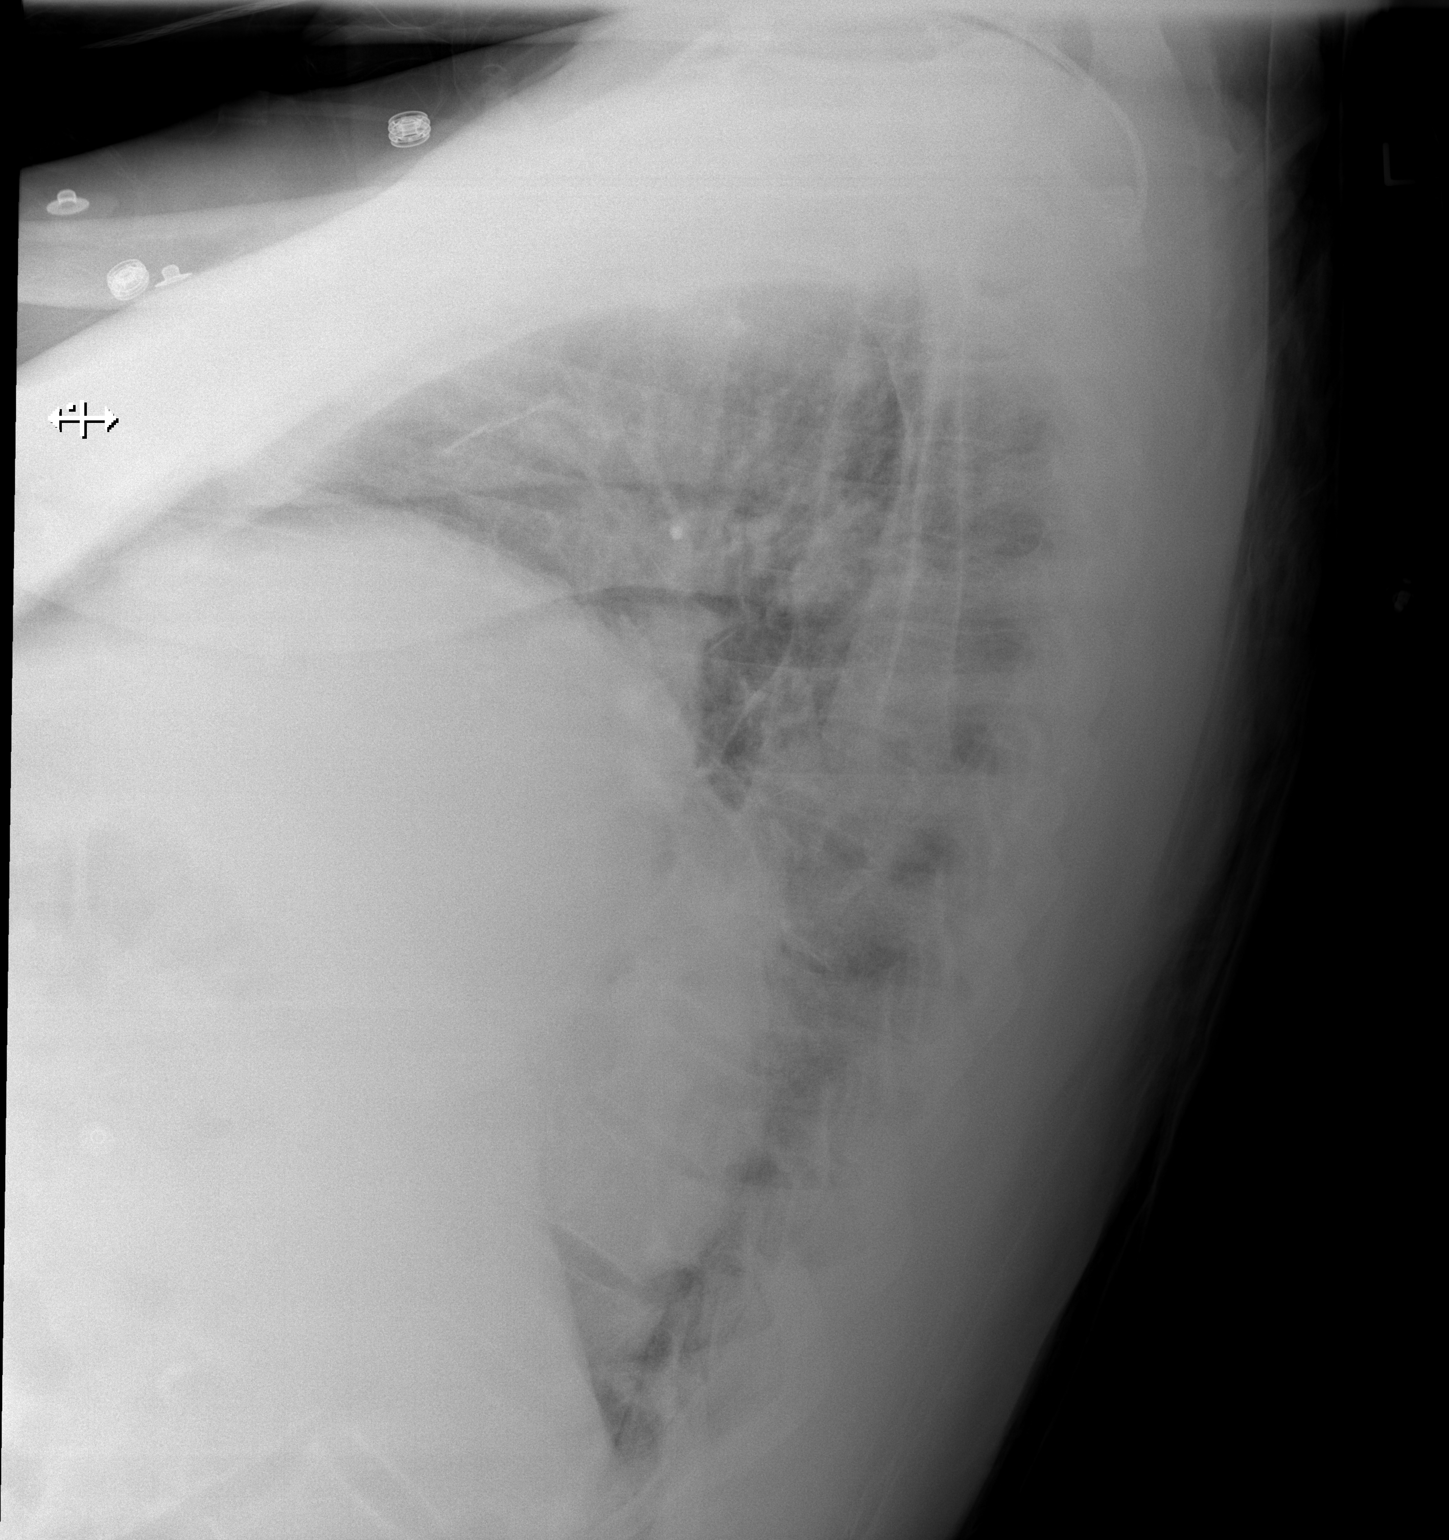

[x chest ap]
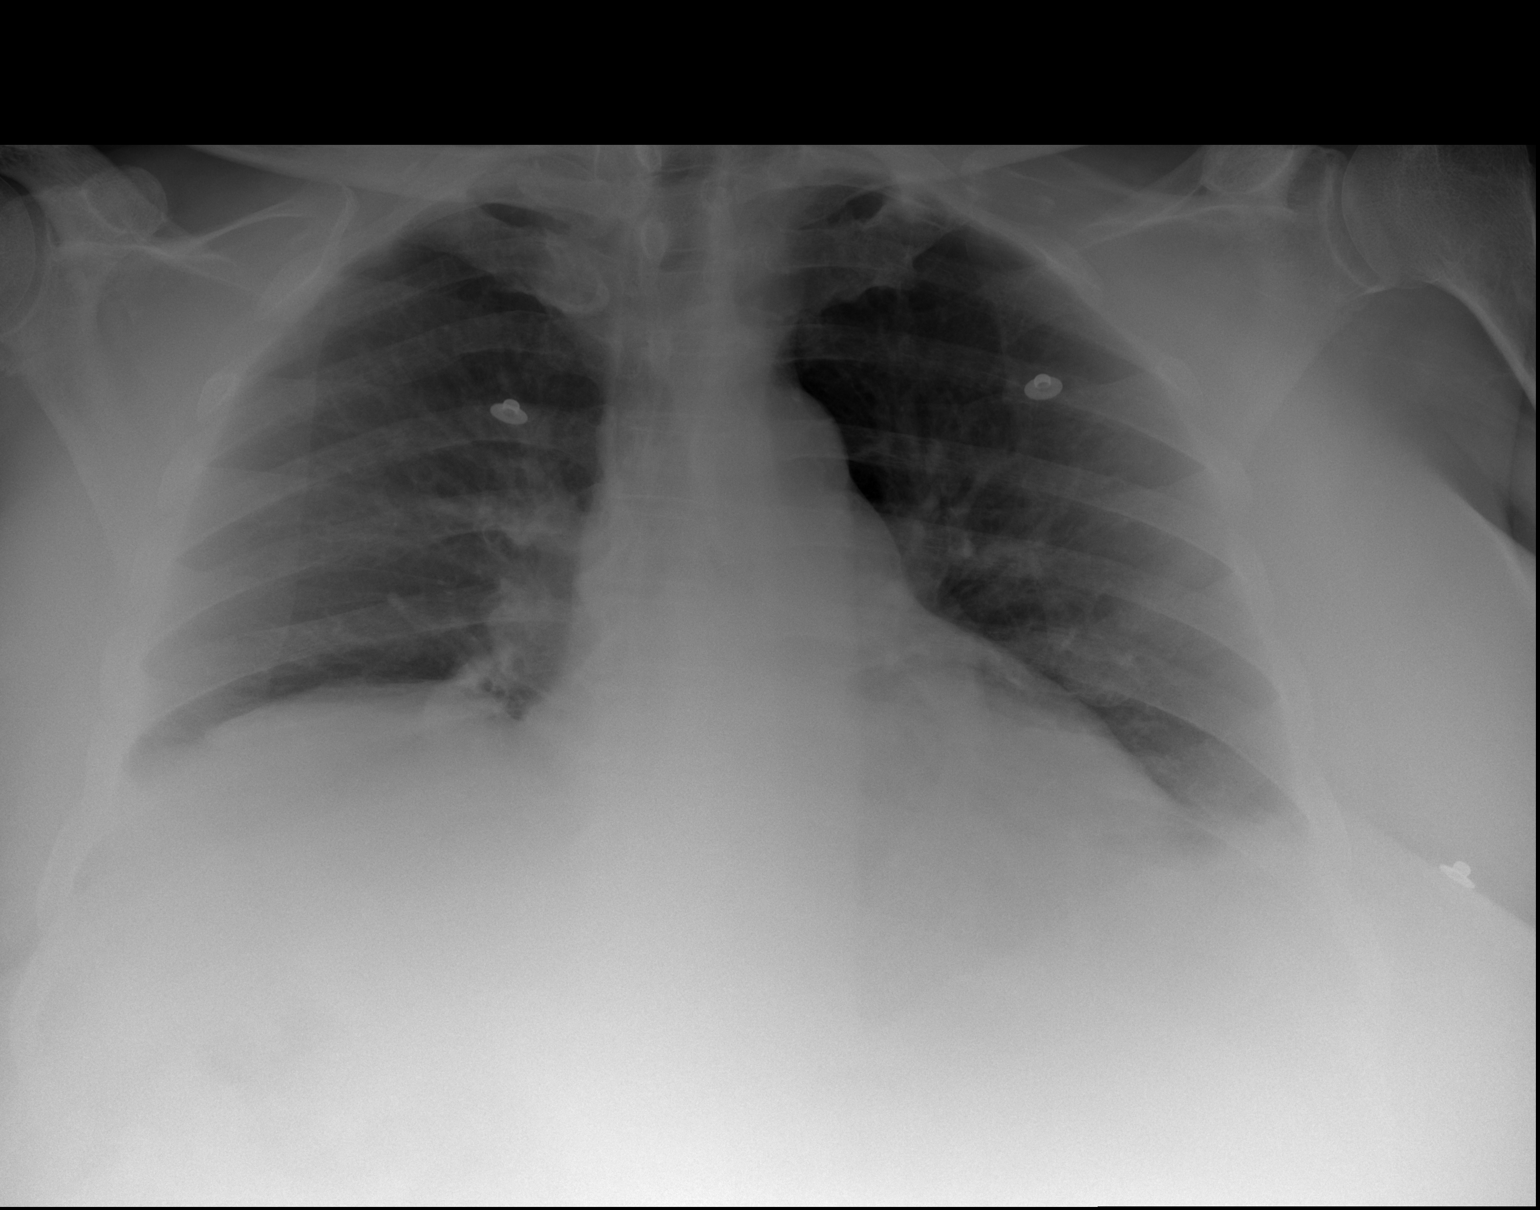

[2 of 2 positions shown; findings below may reference images not displayed]

FINDINGS: Mediastinum and hilar structures are normal. Low lung volumes with
mild basilar atelectasis. Mild infiltrate left lung base cannot be
excluded. Stable elevation right hemidiaphragm. No pleural effusion
or pneumothorax . Cardiomegaly with normal pulmonary vascularity .
No acute bony abnormality.
IMPRESSION: Low lung volumes with mild bibasilar atelectasis. Mild infiltrate
left lung base cannot be excluded.

## 2016-04-04 IMAGING — CT CT ANGIO CHEST
2 of 7 series · 18 of 46 positions shown · IV contrast (APPLIED)
Comparison: Chest radiograph earlier this day

CLINICAL DATA: Syncope today.

EXAM:
CT ANGIOGRAPHY CHEST WITH CONTRAST
TECHNIQUE: Multidetector CT imaging of the chest was performed using the
standard protocol during bolus administration of intravenous
contrast. Multiplanar CT image reconstructions and MIPs were
obtained to evaluate the vascular anatomy.
CONTRAST:  100 cc Isovue 370 IV

[Series 5: thins · axial · 0.90mm/px · z∈[-313,-47]mm · 15 of 292 slices shown]
[im 13/292  lung]
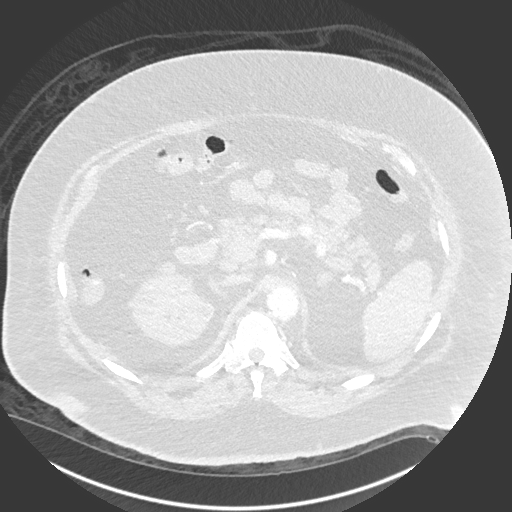
[im 38/292  soft-tissue]
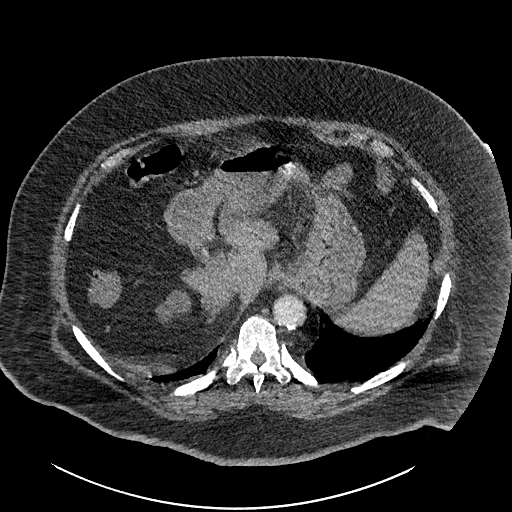
[im 51/292  lung]
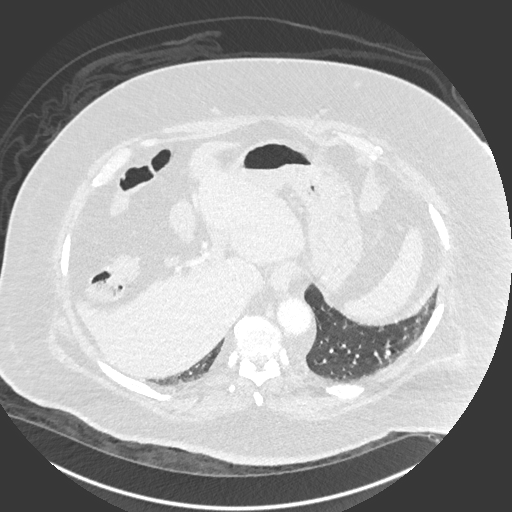
[im 76/292  soft-tissue]
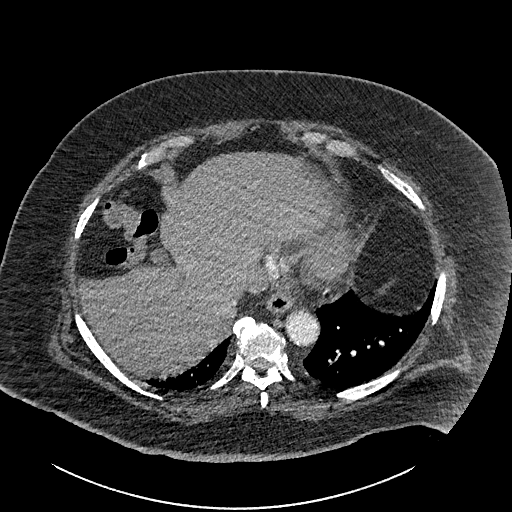
[im 89/292  lung]
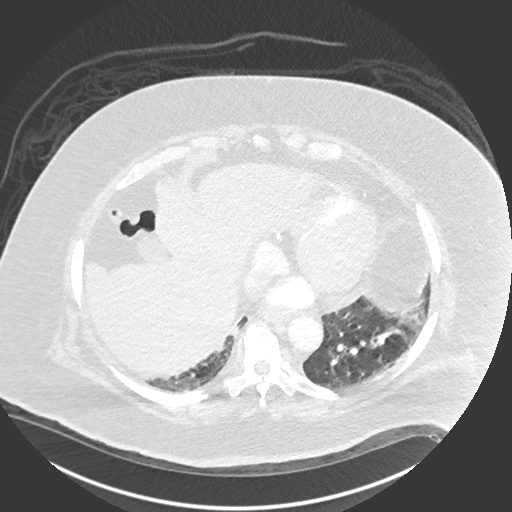
[im 114/292  soft-tissue]
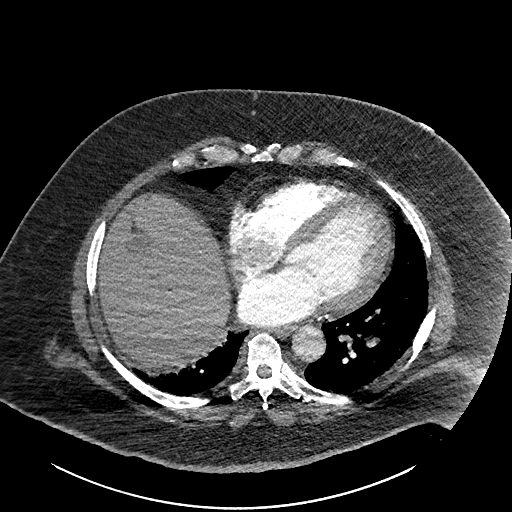
[im 127/292  lung]
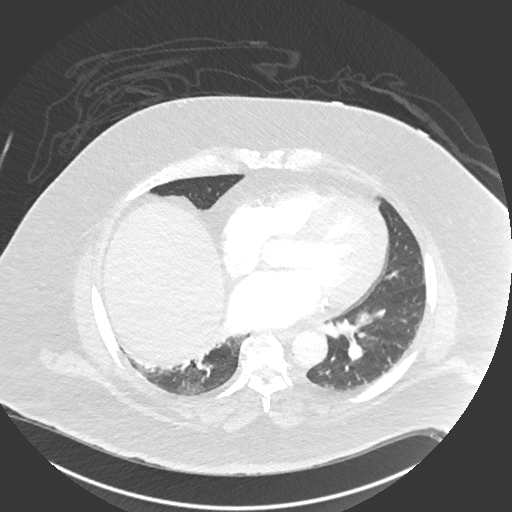
[im 152/292  soft-tissue]
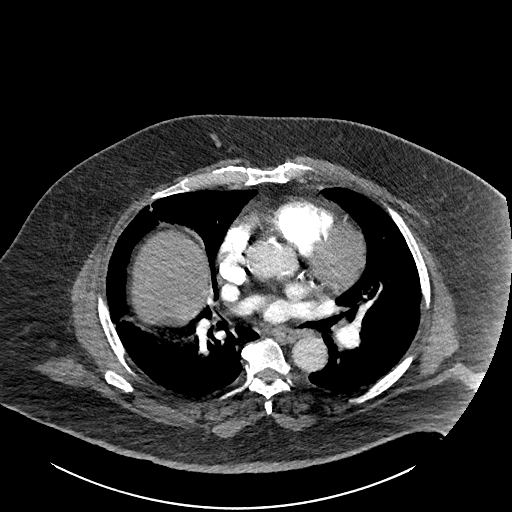
[im 165/292  lung]
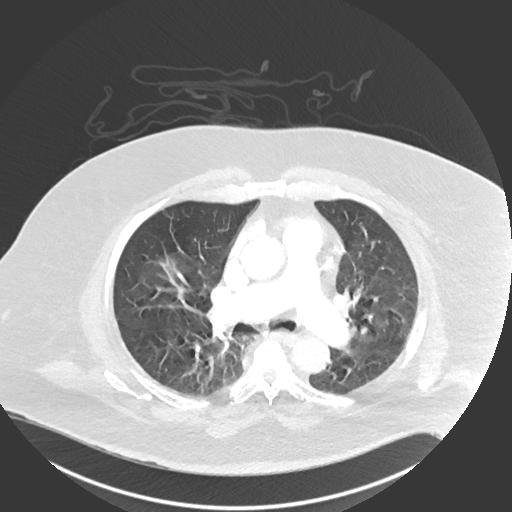
[im 178/292  soft-tissue]
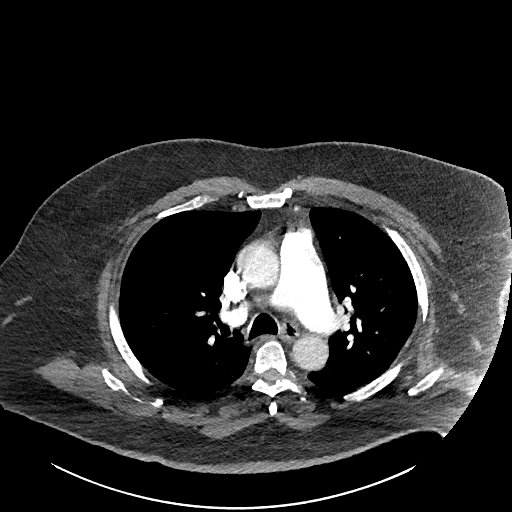
[im 203/292  lung]
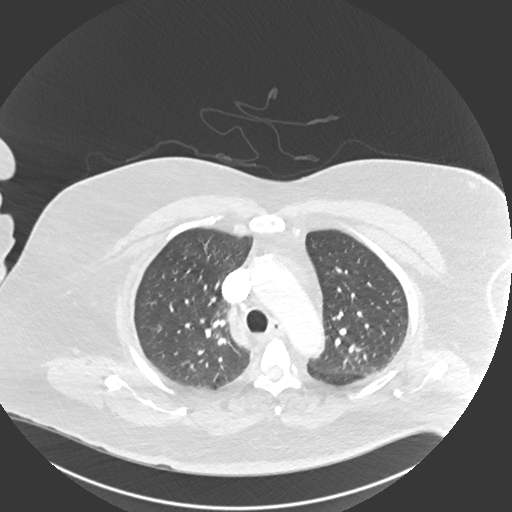
[im 216/292  soft-tissue]
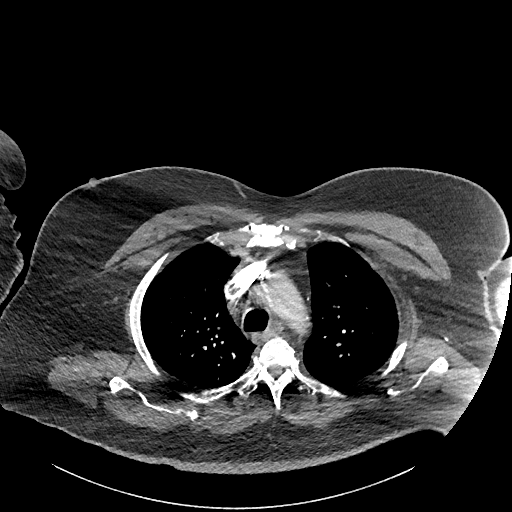
[im 241/292  lung]
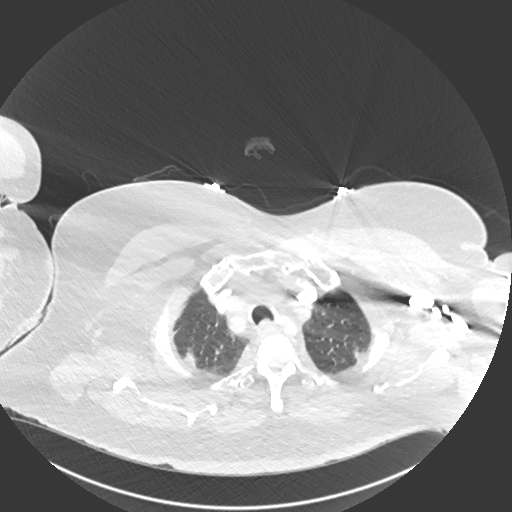
[im 254/292  soft-tissue]
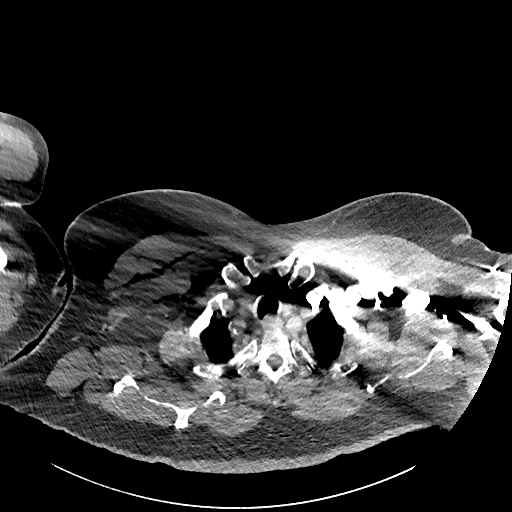
[im 279/292  lung]
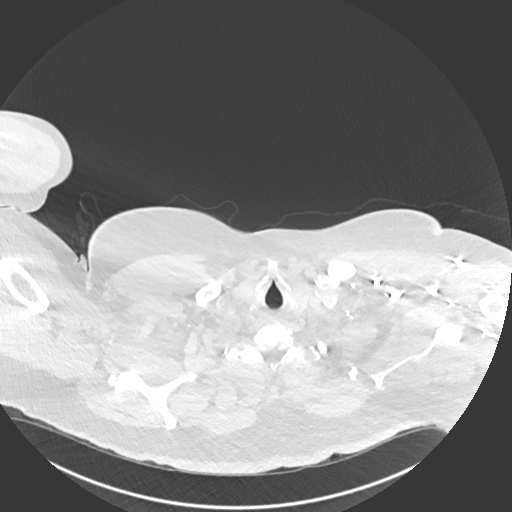

[Series 7: coronal mpr · coronal · 0.56mm/px · 3 of 151 slices shown]
[im 38/151  soft-tissue]
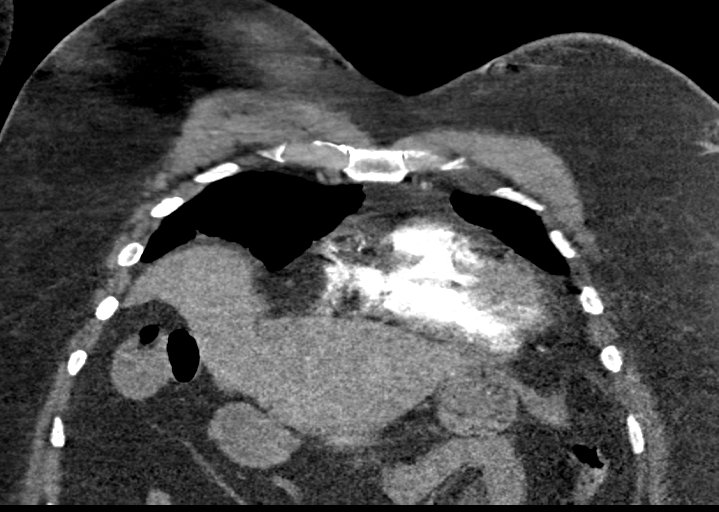
[im 76/151  soft-tissue]
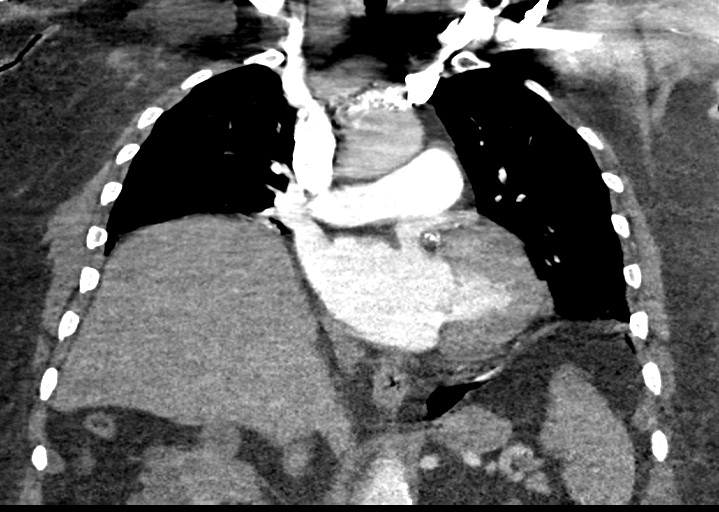
[im 113/151  soft-tissue]
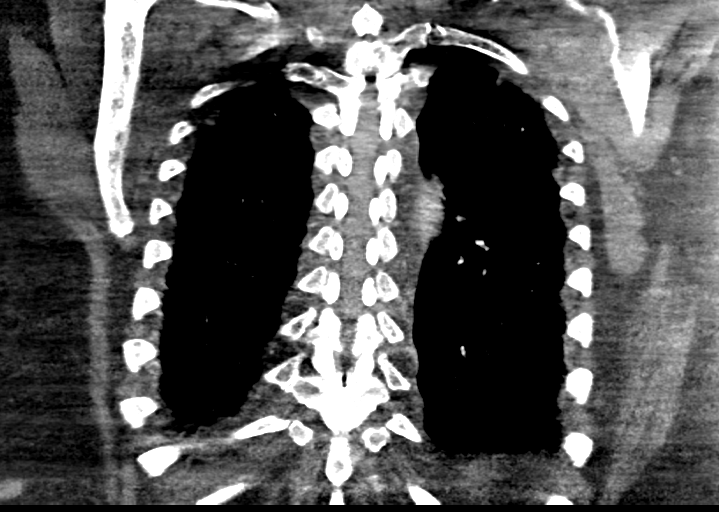

[18 of 46 positions shown; findings below may reference images not displayed]

FINDINGS: Cardiovascular: There are no filling defects within the pulmonary
arteries to suggest pulmonary embolus. Atherosclerosis of the
thoracic aorta with tortuosity, no evidence of dissection.
Three-vessel coronary artery calcifications. Borderline
cardiomegaly.

Mediastinum/Nodes: No mediastinal, hilar, or axillary adenopathy.
Thyroid gland obscured by streak artifact from dense contrast. The
esophagus is decompressed.

Lungs/Pleura: Elevated right hemidiaphragm with adjacent atelectasis
or scarring. No focal airspace disease or evidence of pulmonary
edema. Breathing motion partially limits assessment. No pleural
fluid.

Upper Abdomen: No acute abnormality. Multiple low-density lesions in
the right kidney, suboptimally assessed due to phase contrast,
likely cysts.

Musculoskeletal: No chest wall abnormality. No acute or significant
osseous findings. Degenerative change in the thoracic spine.

Review of the MIP images confirms the above findings.
IMPRESSION: 1. No pulmonary embolus.  No acute intrathoracic abnormality.
2. Thoracic aortic atherosclerosis.  Coronary artery calcifications.

## 2016-04-04 MED ORDER — SODIUM CHLORIDE 0.9 % IV BOLUS (SEPSIS)
1000.0000 mL | Freq: Once | INTRAVENOUS | Status: AC
  Administered 2016-04-04: 1000 mL via INTRAVENOUS

## 2016-04-04 MED ORDER — HYDROCODONE-ACETAMINOPHEN 5-325 MG PO TABS
1.0000 | ORAL_TABLET | Freq: Once | ORAL | Status: AC
  Administered 2016-04-04: 1 via ORAL
  Filled 2016-04-04: qty 1

## 2016-04-04 MED ORDER — IOPAMIDOL (ISOVUE-370) INJECTION 76%
INTRAVENOUS | Status: AC
  Administered 2016-04-04: 100 mL
  Filled 2016-04-04: qty 100

## 2016-04-04 NOTE — ED Triage Notes (Signed)
Pt brought in by EMS due to syncope. Per EMS pt was at home with home health RN and became diaphoretic and then LOC for 2 minutes. Per pt he doesn't remember LOC and says he remembers everything. Pt a&ox4.

## 2016-04-04 NOTE — ED Notes (Signed)
Patient transported to X-ray 

## 2016-04-04 NOTE — ED Notes (Signed)
Pt transported to CT ?

## 2016-04-04 NOTE — Discharge Instructions (Addendum)
Your work up today shows that you are likely dehydrated. This may have contributed to your passing out episode. You should have your kidney function re-checked when you see your doctor this week for re-evaluation to make sure it is back to normal.  Your work up today did not show significant electrolyte abnormalities or signs that you have an infection. You did not have a blood clot in your lung on your CT scan.   You should return to the emergency department if you develop chest pain, have further episodes of passing out, or any new or worsening symptoms.

## 2016-04-04 NOTE — ED Notes (Signed)
Called CT and was informed pt should be next

## 2016-04-04 NOTE — ED Notes (Signed)
Pt's CBG 83.  Informed Katie, Therapist, sports.

## 2016-04-04 NOTE — ED Notes (Signed)
Assisted Santiago Glad, RN in getting the pt on a bedpan.  Was assisted by Santiago Glad, RN and [employer], RN in removing the pt from the bedpan.  Pericare performed.

## 2016-04-04 NOTE — ED Notes (Signed)
Pt verbalized understanding discharge instructions and denies any further needs or questions at this time. VS stable 

## 2016-04-04 NOTE — ED Notes (Signed)
Notified MD pt is requesting pain medication. Will await orders.

## 2016-04-04 NOTE — ED Provider Notes (Signed)
[facility] DEPT Provider Note   CSN: [id number] Arrival date & time: 04/04/16  1442     History   Chief Complaint Chief Complaint  Patient presents with  . Loss of Consciousness    HPI Nathan Proctor. is a 75 y.o. male.  HPI Patient is a 75 year old male with past nuchal history significant for type 2 diabetes, chronic kidney disease, chronic anemia, status post recent fracture of left lower leg who presents after an episode of syncope. Patient reports he was at home and he just taken all his medications. He had not eaten breakfast yet. He was sitting in a wheelchair when he became sweaty, lightheaded and then lost consciousness. Patient reports he lost consciousness for only a few seconds. Per EMS report home health nurse reportedly was about 2 minutes. Patient rapidly returned to baseline and is currently feeling asymptomatic. Blood sugar 99 prior to arrival. Patient is currently nonambulatory due to recent left lower extremity fracture. He has been doing physical therapy but is mainly confined to a wheelchair. He is not currently taking any anticoagulation. He does not have a prior history of bleeding or clotting problems.  Past Medical History:  Diagnosis Date  . Anemia of chronic disease   . Ankle fracture 02/15/2016  . Cancer [facility])    Prostate  . Chronic constipation   . Chronic kidney disease    stage III  . Diabetes mellitus without complication ([place])    type II   . Diabetic peripheral neuropathy ([place])   . Failure to thrive (0-17)   . Fracture of left lower leg   . Gout   . Hyperlipidemia   . Hypertension   . Morbid obesity ([place])   . Unstable gait     Patient Active Problem List   Diagnosis Date Noted  . Ankle fracture, left 02/29/2016  . Closed left ankle fracture 02/29/2016  . Gout 02/20/2016  . Essential hypertension 02/20/2016    Past Surgical History:  Procedure Laterality Date  . ORIF ANKLE FRACTURE Left 02/29/2016   Procedure: OPEN  REDUCTION INTERNAL FIXATION (ORIF) ANKLE FRACTURE;  Surgeon: Nicholes Stairs, MD;  Location: [facility] ORS;  Service: Orthopedics;  Laterality: Left;  . PROSTATE SURGERY         Home Medications    Prior to Admission medications   Medication Sig Start Date End Date Taking? Authorizing Provider  acetaminophen (TYLENOL) 500 MG tablet Take 1,000 mg by mouth every 8 (eight) hours as needed for mild pain.   Yes Historical Provider, MD  allopurinol (ZYLOPRIM) 100 MG tablet Take 100 mg by mouth daily.  01/25/16  Yes Historical Provider, MD  amLODipine (NORVASC) 10 MG tablet Take 10 mg by mouth daily.    Yes Historical Provider, MD  atorvastatin (LIPITOR) 40 MG tablet Take 40 mg by mouth daily.    Yes Historical Provider, MD  carvedilol (COREG) 25 MG tablet Take 25 mg by mouth daily.    Yes Historical Provider, MD  docusate sodium (COLACE) 100 MG capsule Take 100 mg by mouth 2 (two) times daily.   Yes Historical Provider, MD  gabapentin (NEURONTIN) 300 MG capsule Take 300 mg by mouth daily.    Yes Historical Provider, MD  HYDROcodone-acetaminophen (NORCO/VICODIN) 5-325 MG tablet Take 1 tablet by mouth every 8 (eight) hours as needed for moderate pain.   Yes Historical Provider, MD  insulin lispro protamine-lispro (HUMALOG 75/25 MIX) (75-25) 100 UNIT/ML SUSP injection Inject 60 Units into the skin daily with breakfast.  Yes Historical Provider, MD  lubiprostone (AMITIZA) 24 MCG capsule Take 24 mcg by mouth daily with breakfast.    Yes Historical Provider, MD  Vitamin D, Ergocalciferol, (DRISDOL) 50000 units CAPS capsule Take 50,000 Units by mouth 2 (two) times a week. On Wed and Fri per [facility].   Yes Historical Provider, MD    Family History No family history on file.  Social History Social History  Substance Use Topics  . Smoking status: Never Smoker  . Smokeless tobacco: Never Used  . Alcohol use No     Allergies   Patient has no known allergies.   Review of Systems Review of Systems    Constitutional: Positive for diaphoresis. Negative for chills and fever.  HENT: Negative for ear pain and sore throat.   Eyes: Negative for pain and visual disturbance.  Respiratory: Negative for cough and shortness of breath.   Cardiovascular: Negative for chest pain and palpitations.  Gastrointestinal: Negative for abdominal pain and vomiting.  Genitourinary: Negative for dysuria and hematuria.  Musculoskeletal: Negative for arthralgias and back pain.  Skin: Negative for color change and rash.  Neurological: Positive for dizziness, syncope and light-headedness. Negative for seizures, speech difficulty, weakness, numbness and headaches.  All other systems reviewed and are negative.    Physical Exam Updated Vital Signs BP 117/68   Pulse 74   Temp 97.7 F (36.5 C) (Oral)   Resp 18   Ht 5\' 10"  (1.778 m)   Wt (!) 156.5 kg   SpO2 96%   BMI 49.50 kg/m   Physical Exam  Constitutional: He is oriented to person, place, and time. He appears well-developed and well-nourished.  HENT:  Head: Normocephalic and atraumatic.  Eyes: Conjunctivae are normal.  Neck: Neck supple.  Cardiovascular: Normal rate and regular rhythm.   No murmur heard. Pulmonary/Chest: Effort normal and breath sounds normal. No respiratory distress.  Abdominal: Soft. There is no tenderness.  Musculoskeletal: He exhibits no edema.  Distal left lower extremity and fracture boot. Able to wiggle left toes. Left foot is neurovascularly intact. No signs of compartment syndrome. 5 out of 5 strength in bilateral upper extremities and right lower extremity. Strength is not tested in left lower extremity due to known weightbearing injury.  Neurological: He is alert and oriented to person, place, and time. He has normal strength and normal reflexes. No cranial nerve deficit or sensory deficit. He displays a negative Romberg sign. Coordination normal. GCS eye subscore is 4. GCS verbal subscore is 5. GCS motor subscore is 6.   Gait not tested as patient currently nonweightbearing.  Skin: Skin is warm and dry.  Psychiatric: He has a normal mood and affect.  Nursing note and vitals reviewed.    ED Treatments / Results  Labs (all labs ordered are listed, but only abnormal results are displayed) Labs Reviewed  CBC WITH DIFFERENTIAL/PLATELET - Abnormal; Notable for the following:       Result Value   RBC 3.97 (*)    Hemoglobin 11.6 (*)    HCT 36.3 (*)    All other components within normal limits  BASIC METABOLIC PANEL - Abnormal; Notable for the following:    BUN 21 (*)    Creatinine, Ser 1.48 (*)    GFR calc non Af Amer 44 (*)    GFR calc Af Amer 51 (*)    All other components within normal limits  D-DIMER, QUANTITATIVE (NOT AT [facility]) - Abnormal; Notable for the following:    D-Dimer, Quant 1.94 (*)  All other components within normal limits  HEPATIC FUNCTION PANEL - Abnormal; Notable for the following:    ALT 15 (*)    All other components within normal limits  URINALYSIS, ROUTINE W REFLEX MICROSCOPIC  CBG MONITORING, ED    EKG  EKG Interpretation  Date/Time:  Wednesday April 04 2016 14:52:55 EST Ventricular Rate:  67 PR Interval:    QRS Duration: 94 QT Interval:  437 QTC Calculation: 462 R Axis:   6 Text Interpretation:  Sinus rhythm Borderline T wave abnormalities NO STEMI When compared to prior, no significant changes were seen.  Confirmed by Sherry Ruffing MD, [place] [phone]) on 04/04/2016 3:39:07 PM       Radiology Dg Chest 2 View  Result Date: 04/04/2016 CLINICAL DATA:  Diaphoresis. EXAM: CHEST  2 VIEW COMPARISON:  08/29/2011. FINDINGS: Mediastinum and hilar structures are normal. Low lung volumes with mild basilar atelectasis. Mild infiltrate left lung base cannot be excluded. Stable elevation right hemidiaphragm. No pleural effusion or pneumothorax . Cardiomegaly with normal pulmonary vascularity . No acute bony abnormality. IMPRESSION: Low lung volumes with mild bibasilar  atelectasis. Mild infiltrate left lung base cannot be excluded. Electronically Signed   By: Marcello Moores  Register   On: 04/04/2016 16:33   Ct Angio Chest Pe W And/or Wo Contrast  Result Date: 04/04/2016 CLINICAL DATA:  Syncope today. EXAM: CT ANGIOGRAPHY CHEST WITH CONTRAST TECHNIQUE: Multidetector CT imaging of the chest was performed using the standard protocol during bolus administration of intravenous contrast. Multiplanar CT image reconstructions and MIPs were obtained to evaluate the vascular anatomy. CONTRAST:  100 cc Isovue 370 IV COMPARISON:  Chest radiograph earlier this day FINDINGS: Cardiovascular: There are no filling defects within the pulmonary arteries to suggest pulmonary embolus. Atherosclerosis of the thoracic aorta with tortuosity, no evidence of dissection. Three-vessel coronary artery calcifications. Borderline cardiomegaly. Mediastinum/Nodes: No mediastinal, hilar, or axillary adenopathy. Thyroid gland obscured by streak artifact from dense contrast. The esophagus is decompressed. Lungs/Pleura: Elevated right hemidiaphragm with adjacent atelectasis or scarring. No focal airspace disease or evidence of pulmonary edema. Breathing motion partially limits assessment. No pleural fluid. Upper Abdomen: No acute abnormality. Multiple low-density lesions in the right kidney, suboptimally assessed due to phase contrast, likely cysts. Musculoskeletal: No chest wall abnormality. No acute or significant osseous findings. Degenerative change in the thoracic spine. Review of the MIP images confirms the above findings. IMPRESSION: 1. No pulmonary embolus.  No acute intrathoracic abnormality. 2. Thoracic aortic atherosclerosis.  Coronary artery calcifications. Electronically Signed   By: Jeb Levering M.D.   On: 04/04/2016 20:39    Procedures Procedures (including critical care time)  Medications Ordered in ED Medications  sodium chloride 0.9 % bolus 1,000 mL (0 mLs Intravenous Stopped 04/04/16 1911)   HYDROcodone-acetaminophen (NORCO/VICODIN) 5-325 MG per tablet 1 tablet (1 tablet Oral Given 04/04/16 1735)  iopamidol (ISOVUE-370) 76 % injection (100 mLs  Contrast Given 04/04/16 2008)     Initial Impression / Assessment and Plan / ED Course  I have reviewed the triage vital signs and the nursing notes.  Pertinent labs & imaging results that were available during my care of the patient were reviewed by me and considered in my medical decision making (see chart for details).  Clinical Course    Patient is a 75 yo male who presents after syncopal episode as described above. On presentation patient is afebrile, VSS. No focal deficits identified on neurologic exam. IV fluid bolus given. Syncope workup including EKG, chest x-ray, labs obtained. In addition d-dimer ordered  to evaluate for possible PE as patient has been nonambulatory following surgery for his left lower extremity fracture. EKG does not show acute arrhythmia or ischemic changes. Chest x-ray with questionable infiltrate left lung base. Patient denies cough, fever or sputum production. Doubt PNA. Labs significant for mildly elevated BUN and creatinine consistent with dehydration. Mild chronic anemia. D-dimer is elevated. CTA chest obtained and is negative for PE. In addition chest CT does not show signs of pneumonia or other acute cardiac or pulmonary abnormality. UA negative for signs of infection. Likely had a vasovagal syncope episode secondary to taking medicines without eating and mild dehydration. Based on history, exam, and work up, doubt neurologic, cardiac, infectious or metabolic cause of syncope. Patient has remained HDS in the emergency department and remains asymptomatic, with non-focal neurologic exam on reevaluation.  He was discharged in stable condition. Will follow up with primary care doctor for reevaluation and re-check of renal function. Strict return precautions were discussed and patient reports understanding and  agreement with plan.  Patient seen and discussed with Dr. Sherry Ruffing, ED attending  Final Clinical Impressions(s) / ED Diagnoses   Final diagnoses:  Dehydration  Syncope, unspecified syncope type    New Prescriptions Discharge Medication List as of 04/04/2016 10:08 PM       Gibson Ramp, MD 04/05/16 0410    Gwenyth Allegra Tegeler, MD 04/05/16 2210

## 2016-04-07 ENCOUNTER — Other Ambulatory Visit: Payer: Self-pay | Admitting: Adult Health

## 2016-05-06 ENCOUNTER — Other Ambulatory Visit: Payer: Self-pay | Admitting: Adult Health

## 2016-05-08 ENCOUNTER — Other Ambulatory Visit: Payer: Self-pay | Admitting: Adult Health

## 2016-05-10 ENCOUNTER — Other Ambulatory Visit: Payer: Self-pay | Admitting: Adult Health

## 2016-05-18 ENCOUNTER — Other Ambulatory Visit: Payer: Self-pay | Admitting: Adult Health

## 2016-05-20 DIAGNOSIS — R2689 Other abnormalities of gait and mobility: Secondary | ICD-10-CM | POA: Diagnosis not present

## 2016-05-20 DIAGNOSIS — R531 Weakness: Secondary | ICD-10-CM | POA: Diagnosis not present

## 2016-05-20 DIAGNOSIS — S82892D Other fracture of left lower leg, subsequent encounter for closed fracture with routine healing: Secondary | ICD-10-CM | POA: Diagnosis not present

## 2016-05-20 DIAGNOSIS — Z4789 Encounter for other orthopedic aftercare: Secondary | ICD-10-CM | POA: Diagnosis not present

## 2016-05-20 DIAGNOSIS — R269 Unspecified abnormalities of gait and mobility: Secondary | ICD-10-CM | POA: Diagnosis not present

## 2016-05-20 DIAGNOSIS — S82832D Other fracture of upper and lower end of left fibula, subsequent encounter for closed fracture with routine healing: Secondary | ICD-10-CM | POA: Diagnosis not present

## 2016-05-23 DIAGNOSIS — E1122 Type 2 diabetes mellitus with diabetic chronic kidney disease: Secondary | ICD-10-CM | POA: Diagnosis not present

## 2016-05-23 DIAGNOSIS — N183 Chronic kidney disease, stage 3 (moderate): Secondary | ICD-10-CM | POA: Diagnosis not present

## 2016-05-23 DIAGNOSIS — S82892D Other fracture of left lower leg, subsequent encounter for closed fracture with routine healing: Secondary | ICD-10-CM | POA: Diagnosis not present

## 2016-05-23 DIAGNOSIS — R269 Unspecified abnormalities of gait and mobility: Secondary | ICD-10-CM | POA: Diagnosis not present

## 2016-05-23 DIAGNOSIS — R531 Weakness: Secondary | ICD-10-CM | POA: Diagnosis not present

## 2016-05-23 DIAGNOSIS — E785 Hyperlipidemia, unspecified: Secondary | ICD-10-CM | POA: Diagnosis not present

## 2016-05-23 DIAGNOSIS — M109 Gout, unspecified: Secondary | ICD-10-CM | POA: Diagnosis not present

## 2016-05-23 DIAGNOSIS — I129 Hypertensive chronic kidney disease with stage 1 through stage 4 chronic kidney disease, or unspecified chronic kidney disease: Secondary | ICD-10-CM | POA: Diagnosis not present

## 2016-05-23 DIAGNOSIS — E1142 Type 2 diabetes mellitus with diabetic polyneuropathy: Secondary | ICD-10-CM | POA: Diagnosis not present

## 2016-05-24 DIAGNOSIS — S82892D Other fracture of left lower leg, subsequent encounter for closed fracture with routine healing: Secondary | ICD-10-CM | POA: Diagnosis not present

## 2016-05-26 ENCOUNTER — Other Ambulatory Visit: Payer: Self-pay | Admitting: Adult Health

## 2016-06-20 DIAGNOSIS — S82832D Other fracture of upper and lower end of left fibula, subsequent encounter for closed fracture with routine healing: Secondary | ICD-10-CM | POA: Diagnosis not present

## 2016-06-20 DIAGNOSIS — R531 Weakness: Secondary | ICD-10-CM | POA: Diagnosis not present

## 2016-06-20 DIAGNOSIS — R2689 Other abnormalities of gait and mobility: Secondary | ICD-10-CM | POA: Diagnosis not present

## 2016-06-20 DIAGNOSIS — Z4789 Encounter for other orthopedic aftercare: Secondary | ICD-10-CM | POA: Diagnosis not present

## 2016-06-20 DIAGNOSIS — S82892D Other fracture of left lower leg, subsequent encounter for closed fracture with routine healing: Secondary | ICD-10-CM | POA: Diagnosis not present

## 2016-06-20 DIAGNOSIS — R269 Unspecified abnormalities of gait and mobility: Secondary | ICD-10-CM | POA: Diagnosis not present

## 2016-07-18 DIAGNOSIS — R531 Weakness: Secondary | ICD-10-CM | POA: Diagnosis not present

## 2016-07-18 DIAGNOSIS — S82832D Other fracture of upper and lower end of left fibula, subsequent encounter for closed fracture with routine healing: Secondary | ICD-10-CM | POA: Diagnosis not present

## 2016-07-18 DIAGNOSIS — R2689 Other abnormalities of gait and mobility: Secondary | ICD-10-CM | POA: Diagnosis not present

## 2016-07-18 DIAGNOSIS — R269 Unspecified abnormalities of gait and mobility: Secondary | ICD-10-CM | POA: Diagnosis not present

## 2016-07-18 DIAGNOSIS — Z4789 Encounter for other orthopedic aftercare: Secondary | ICD-10-CM | POA: Diagnosis not present

## 2016-07-18 DIAGNOSIS — S82892D Other fracture of left lower leg, subsequent encounter for closed fracture with routine healing: Secondary | ICD-10-CM | POA: Diagnosis not present

## 2016-07-20 DIAGNOSIS — S82892D Other fracture of left lower leg, subsequent encounter for closed fracture with routine healing: Secondary | ICD-10-CM | POA: Diagnosis not present

## 2016-07-20 DIAGNOSIS — Z4789 Encounter for other orthopedic aftercare: Secondary | ICD-10-CM | POA: Diagnosis not present

## 2016-08-18 DIAGNOSIS — Z4789 Encounter for other orthopedic aftercare: Secondary | ICD-10-CM | POA: Diagnosis not present

## 2016-08-18 DIAGNOSIS — R531 Weakness: Secondary | ICD-10-CM | POA: Diagnosis not present

## 2016-08-18 DIAGNOSIS — R269 Unspecified abnormalities of gait and mobility: Secondary | ICD-10-CM | POA: Diagnosis not present

## 2016-08-18 DIAGNOSIS — R2689 Other abnormalities of gait and mobility: Secondary | ICD-10-CM | POA: Diagnosis not present

## 2016-08-18 DIAGNOSIS — S82892D Other fracture of left lower leg, subsequent encounter for closed fracture with routine healing: Secondary | ICD-10-CM | POA: Diagnosis not present

## 2016-08-18 DIAGNOSIS — S82832D Other fracture of upper and lower end of left fibula, subsequent encounter for closed fracture with routine healing: Secondary | ICD-10-CM | POA: Diagnosis not present

## 2016-08-23 DIAGNOSIS — E119 Type 2 diabetes mellitus without complications: Secondary | ICD-10-CM | POA: Diagnosis not present

## 2016-08-23 DIAGNOSIS — Z125 Encounter for screening for malignant neoplasm of prostate: Secondary | ICD-10-CM | POA: Diagnosis not present

## 2016-08-23 DIAGNOSIS — I10 Essential (primary) hypertension: Secondary | ICD-10-CM | POA: Diagnosis not present

## 2016-08-23 DIAGNOSIS — E559 Vitamin D deficiency, unspecified: Secondary | ICD-10-CM | POA: Diagnosis not present

## 2016-08-23 DIAGNOSIS — E538 Deficiency of other specified B group vitamins: Secondary | ICD-10-CM | POA: Diagnosis not present

## 2016-09-17 DIAGNOSIS — S82892D Other fracture of left lower leg, subsequent encounter for closed fracture with routine healing: Secondary | ICD-10-CM | POA: Diagnosis not present

## 2016-09-17 DIAGNOSIS — R2689 Other abnormalities of gait and mobility: Secondary | ICD-10-CM | POA: Diagnosis not present

## 2016-09-17 DIAGNOSIS — R531 Weakness: Secondary | ICD-10-CM | POA: Diagnosis not present

## 2016-09-17 DIAGNOSIS — R269 Unspecified abnormalities of gait and mobility: Secondary | ICD-10-CM | POA: Diagnosis not present

## 2016-09-17 DIAGNOSIS — Z4789 Encounter for other orthopedic aftercare: Secondary | ICD-10-CM | POA: Diagnosis not present

## 2016-09-17 DIAGNOSIS — S82832D Other fracture of upper and lower end of left fibula, subsequent encounter for closed fracture with routine healing: Secondary | ICD-10-CM | POA: Diagnosis not present

## 2016-10-18 DIAGNOSIS — S82892D Other fracture of left lower leg, subsequent encounter for closed fracture with routine healing: Secondary | ICD-10-CM | POA: Diagnosis not present

## 2016-10-18 DIAGNOSIS — S82832D Other fracture of upper and lower end of left fibula, subsequent encounter for closed fracture with routine healing: Secondary | ICD-10-CM | POA: Diagnosis not present

## 2016-10-18 DIAGNOSIS — R2689 Other abnormalities of gait and mobility: Secondary | ICD-10-CM | POA: Diagnosis not present

## 2016-10-18 DIAGNOSIS — Z4789 Encounter for other orthopedic aftercare: Secondary | ICD-10-CM | POA: Diagnosis not present

## 2016-10-18 DIAGNOSIS — R531 Weakness: Secondary | ICD-10-CM | POA: Diagnosis not present

## 2016-10-18 DIAGNOSIS — R269 Unspecified abnormalities of gait and mobility: Secondary | ICD-10-CM | POA: Diagnosis not present

## 2016-11-17 DIAGNOSIS — R2689 Other abnormalities of gait and mobility: Secondary | ICD-10-CM | POA: Diagnosis not present

## 2016-11-17 DIAGNOSIS — R531 Weakness: Secondary | ICD-10-CM | POA: Diagnosis not present

## 2016-11-17 DIAGNOSIS — S82892D Other fracture of left lower leg, subsequent encounter for closed fracture with routine healing: Secondary | ICD-10-CM | POA: Diagnosis not present

## 2016-11-17 DIAGNOSIS — S82832D Other fracture of upper and lower end of left fibula, subsequent encounter for closed fracture with routine healing: Secondary | ICD-10-CM | POA: Diagnosis not present

## 2016-11-17 DIAGNOSIS — Z4789 Encounter for other orthopedic aftercare: Secondary | ICD-10-CM | POA: Diagnosis not present

## 2016-11-17 DIAGNOSIS — R269 Unspecified abnormalities of gait and mobility: Secondary | ICD-10-CM | POA: Diagnosis not present

## 2016-12-04 DIAGNOSIS — E7800 Pure hypercholesterolemia, unspecified: Secondary | ICD-10-CM | POA: Diagnosis not present

## 2016-12-04 DIAGNOSIS — I10 Essential (primary) hypertension: Secondary | ICD-10-CM | POA: Diagnosis not present

## 2016-12-04 DIAGNOSIS — E119 Type 2 diabetes mellitus without complications: Secondary | ICD-10-CM | POA: Diagnosis not present

## 2016-12-18 DIAGNOSIS — Z4789 Encounter for other orthopedic aftercare: Secondary | ICD-10-CM | POA: Diagnosis not present

## 2016-12-18 DIAGNOSIS — R2689 Other abnormalities of gait and mobility: Secondary | ICD-10-CM | POA: Diagnosis not present

## 2016-12-18 DIAGNOSIS — R269 Unspecified abnormalities of gait and mobility: Secondary | ICD-10-CM | POA: Diagnosis not present

## 2016-12-18 DIAGNOSIS — R531 Weakness: Secondary | ICD-10-CM | POA: Diagnosis not present

## 2016-12-18 DIAGNOSIS — S82892D Other fracture of left lower leg, subsequent encounter for closed fracture with routine healing: Secondary | ICD-10-CM | POA: Diagnosis not present

## 2016-12-18 DIAGNOSIS — S82832D Other fracture of upper and lower end of left fibula, subsequent encounter for closed fracture with routine healing: Secondary | ICD-10-CM | POA: Diagnosis not present

## 2017-01-09 ENCOUNTER — Other Ambulatory Visit: Payer: Self-pay | Admitting: Adult Health

## 2017-01-18 DIAGNOSIS — R269 Unspecified abnormalities of gait and mobility: Secondary | ICD-10-CM | POA: Diagnosis not present

## 2017-01-18 DIAGNOSIS — Z4789 Encounter for other orthopedic aftercare: Secondary | ICD-10-CM | POA: Diagnosis not present

## 2017-01-18 DIAGNOSIS — R2689 Other abnormalities of gait and mobility: Secondary | ICD-10-CM | POA: Diagnosis not present

## 2017-01-18 DIAGNOSIS — S82832D Other fracture of upper and lower end of left fibula, subsequent encounter for closed fracture with routine healing: Secondary | ICD-10-CM | POA: Diagnosis not present

## 2017-01-18 DIAGNOSIS — R531 Weakness: Secondary | ICD-10-CM | POA: Diagnosis not present

## 2017-01-18 DIAGNOSIS — S82892D Other fracture of left lower leg, subsequent encounter for closed fracture with routine healing: Secondary | ICD-10-CM | POA: Diagnosis not present

## 2017-04-02 DIAGNOSIS — I10 Essential (primary) hypertension: Secondary | ICD-10-CM | POA: Diagnosis not present

## 2017-04-02 DIAGNOSIS — E7800 Pure hypercholesterolemia, unspecified: Secondary | ICD-10-CM | POA: Diagnosis not present

## 2017-04-02 DIAGNOSIS — E119 Type 2 diabetes mellitus without complications: Secondary | ICD-10-CM | POA: Diagnosis not present

## 2017-04-04 ENCOUNTER — Other Ambulatory Visit: Payer: Self-pay | Admitting: Adult Health

## 2017-04-16 DIAGNOSIS — E119 Type 2 diabetes mellitus without complications: Secondary | ICD-10-CM | POA: Diagnosis not present

## 2017-04-16 DIAGNOSIS — E7800 Pure hypercholesterolemia, unspecified: Secondary | ICD-10-CM | POA: Diagnosis not present

## 2017-04-16 DIAGNOSIS — I10 Essential (primary) hypertension: Secondary | ICD-10-CM | POA: Diagnosis not present

## 2017-05-23 DIAGNOSIS — K5900 Constipation, unspecified: Secondary | ICD-10-CM | POA: Diagnosis not present

## 2017-05-23 DIAGNOSIS — I10 Essential (primary) hypertension: Secondary | ICD-10-CM | POA: Diagnosis not present

## 2017-05-23 DIAGNOSIS — E7849 Other hyperlipidemia: Secondary | ICD-10-CM | POA: Diagnosis not present

## 2017-05-23 DIAGNOSIS — E0842 Diabetes mellitus due to underlying condition with diabetic polyneuropathy: Secondary | ICD-10-CM | POA: Diagnosis not present

## 2017-05-23 DIAGNOSIS — E119 Type 2 diabetes mellitus without complications: Secondary | ICD-10-CM | POA: Diagnosis not present

## 2017-06-24 DIAGNOSIS — R69 Illness, unspecified: Secondary | ICD-10-CM | POA: Diagnosis not present

## 2017-06-25 DIAGNOSIS — N182 Chronic kidney disease, stage 2 (mild): Secondary | ICD-10-CM | POA: Diagnosis not present

## 2017-06-25 DIAGNOSIS — E7849 Other hyperlipidemia: Secondary | ICD-10-CM | POA: Diagnosis not present

## 2017-06-25 DIAGNOSIS — K5900 Constipation, unspecified: Secondary | ICD-10-CM | POA: Diagnosis not present

## 2017-06-25 DIAGNOSIS — I10 Essential (primary) hypertension: Secondary | ICD-10-CM | POA: Diagnosis not present

## 2017-06-25 DIAGNOSIS — E119 Type 2 diabetes mellitus without complications: Secondary | ICD-10-CM | POA: Diagnosis not present

## 2017-06-26 DIAGNOSIS — K5900 Constipation, unspecified: Secondary | ICD-10-CM | POA: Diagnosis not present

## 2017-06-26 DIAGNOSIS — Z7722 Contact with and (suspected) exposure to environmental tobacco smoke (acute) (chronic): Secondary | ICD-10-CM | POA: Diagnosis not present

## 2017-06-26 DIAGNOSIS — E1042 Type 1 diabetes mellitus with diabetic polyneuropathy: Secondary | ICD-10-CM | POA: Diagnosis not present

## 2017-06-26 DIAGNOSIS — Z7982 Long term (current) use of aspirin: Secondary | ICD-10-CM | POA: Diagnosis not present

## 2017-06-26 DIAGNOSIS — I10 Essential (primary) hypertension: Secondary | ICD-10-CM | POA: Diagnosis not present

## 2017-06-26 DIAGNOSIS — Z87891 Personal history of nicotine dependence: Secondary | ICD-10-CM | POA: Diagnosis not present

## 2017-06-26 DIAGNOSIS — E785 Hyperlipidemia, unspecified: Secondary | ICD-10-CM | POA: Diagnosis not present

## 2017-06-26 DIAGNOSIS — Z809 Family history of malignant neoplasm, unspecified: Secondary | ICD-10-CM | POA: Diagnosis not present

## 2017-06-26 DIAGNOSIS — R32 Unspecified urinary incontinence: Secondary | ICD-10-CM | POA: Diagnosis not present

## 2017-06-26 DIAGNOSIS — Z8546 Personal history of malignant neoplasm of prostate: Secondary | ICD-10-CM | POA: Diagnosis not present

## 2017-07-23 DIAGNOSIS — R69 Illness, unspecified: Secondary | ICD-10-CM | POA: Diagnosis not present

## 2017-08-20 DIAGNOSIS — N182 Chronic kidney disease, stage 2 (mild): Secondary | ICD-10-CM | POA: Diagnosis not present

## 2017-08-20 DIAGNOSIS — I10 Essential (primary) hypertension: Secondary | ICD-10-CM | POA: Diagnosis not present

## 2017-08-20 DIAGNOSIS — E119 Type 2 diabetes mellitus without complications: Secondary | ICD-10-CM | POA: Diagnosis not present

## 2017-08-20 DIAGNOSIS — K5900 Constipation, unspecified: Secondary | ICD-10-CM | POA: Diagnosis not present

## 2017-11-19 DIAGNOSIS — I10 Essential (primary) hypertension: Secondary | ICD-10-CM | POA: Diagnosis not present

## 2017-11-19 DIAGNOSIS — K5900 Constipation, unspecified: Secondary | ICD-10-CM | POA: Diagnosis not present

## 2017-11-19 DIAGNOSIS — Z23 Encounter for immunization: Secondary | ICD-10-CM | POA: Diagnosis not present

## 2017-11-19 DIAGNOSIS — E1142 Type 2 diabetes mellitus with diabetic polyneuropathy: Secondary | ICD-10-CM | POA: Diagnosis not present

## 2017-11-19 DIAGNOSIS — E0842 Diabetes mellitus due to underlying condition with diabetic polyneuropathy: Secondary | ICD-10-CM | POA: Diagnosis not present

## 2017-11-19 DIAGNOSIS — N182 Chronic kidney disease, stage 2 (mild): Secondary | ICD-10-CM | POA: Diagnosis not present

## 2017-11-22 DIAGNOSIS — R69 Illness, unspecified: Secondary | ICD-10-CM | POA: Diagnosis not present

## 2017-12-31 DIAGNOSIS — N182 Chronic kidney disease, stage 2 (mild): Secondary | ICD-10-CM | POA: Diagnosis not present

## 2017-12-31 DIAGNOSIS — N529 Male erectile dysfunction, unspecified: Secondary | ICD-10-CM | POA: Diagnosis not present

## 2017-12-31 DIAGNOSIS — K5900 Constipation, unspecified: Secondary | ICD-10-CM | POA: Diagnosis not present

## 2017-12-31 DIAGNOSIS — E1142 Type 2 diabetes mellitus with diabetic polyneuropathy: Secondary | ICD-10-CM | POA: Diagnosis not present

## 2017-12-31 DIAGNOSIS — I10 Essential (primary) hypertension: Secondary | ICD-10-CM | POA: Diagnosis not present

## 2018-02-11 DIAGNOSIS — I10 Essential (primary) hypertension: Secondary | ICD-10-CM | POA: Diagnosis not present

## 2018-02-11 DIAGNOSIS — K5900 Constipation, unspecified: Secondary | ICD-10-CM | POA: Diagnosis not present

## 2018-02-11 DIAGNOSIS — Z23 Encounter for immunization: Secondary | ICD-10-CM | POA: Diagnosis not present

## 2018-02-11 DIAGNOSIS — N182 Chronic kidney disease, stage 2 (mild): Secondary | ICD-10-CM | POA: Diagnosis not present

## 2018-02-11 DIAGNOSIS — E1142 Type 2 diabetes mellitus with diabetic polyneuropathy: Secondary | ICD-10-CM | POA: Diagnosis not present

## 2018-03-31 DIAGNOSIS — N4883 Acquired buried penis: Secondary | ICD-10-CM | POA: Diagnosis not present

## 2018-03-31 DIAGNOSIS — Z8546 Personal history of malignant neoplasm of prostate: Secondary | ICD-10-CM | POA: Diagnosis not present

## 2018-06-19 ENCOUNTER — Other Ambulatory Visit: Payer: Self-pay | Admitting: Adult Health

## 2019-02-03 ENCOUNTER — Encounter: Payer: Self-pay | Admitting: Podiatry

## 2019-02-03 ENCOUNTER — Other Ambulatory Visit: Payer: Self-pay

## 2019-02-03 ENCOUNTER — Ambulatory Visit: Payer: Medicare Other | Admitting: Podiatry

## 2019-02-03 VITALS — BP 151/90 | HR 95

## 2019-02-03 DIAGNOSIS — M2141 Flat foot [pes planus] (acquired), right foot: Secondary | ICD-10-CM

## 2019-02-03 DIAGNOSIS — M79674 Pain in right toe(s): Secondary | ICD-10-CM | POA: Diagnosis not present

## 2019-02-03 DIAGNOSIS — B351 Tinea unguium: Secondary | ICD-10-CM | POA: Diagnosis not present

## 2019-02-03 DIAGNOSIS — M79675 Pain in left toe(s): Secondary | ICD-10-CM | POA: Diagnosis not present

## 2019-02-03 DIAGNOSIS — M2041 Other hammer toe(s) (acquired), right foot: Secondary | ICD-10-CM

## 2019-02-03 DIAGNOSIS — M2042 Other hammer toe(s) (acquired), left foot: Secondary | ICD-10-CM

## 2019-02-03 DIAGNOSIS — E119 Type 2 diabetes mellitus without complications: Secondary | ICD-10-CM

## 2019-02-03 DIAGNOSIS — Z794 Long term (current) use of insulin: Secondary | ICD-10-CM

## 2019-02-03 DIAGNOSIS — M2142 Flat foot [pes planus] (acquired), left foot: Secondary | ICD-10-CM

## 2019-02-03 NOTE — Patient Instructions (Signed)
Diabetes Mellitus and Foot Care Foot care is an important part of your health, especially when you have diabetes. Diabetes may cause you to have problems because of poor blood flow (circulation) to your feet and legs, which can cause your skin to:  Become thinner and drier.  Break more easily.  Heal more slowly.  Peel and crack. You may also have nerve damage (neuropathy) in your legs and feet, causing decreased feeling in them. This means that you may not notice minor injuries to your feet that could lead to more serious problems. Noticing and addressing any potential problems early is the best way to prevent future foot problems. How to care for your feet Foot hygiene  Wash your feet daily with warm water and mild soap. Do not use hot water. Then, pat your feet and the areas between your toes until they are completely dry. Do not soak your feet as this can dry your skin.  Trim your toenails straight across. Do not dig under them or around the cuticle. File the edges of your nails with an emery board or nail file.  Apply a moisturizing lotion or petroleum jelly to the skin on your feet and to dry, brittle toenails. Use lotion that does not contain alcohol and is unscented. Do not apply lotion between your toes. Shoes and socks  Wear clean socks or stockings every day. Make sure they are not too tight. Do not wear knee-high stockings since they may decrease blood flow to your legs.  Wear shoes that fit properly and have enough cushioning. Always look in your shoes before you put them on to be sure there are no objects inside.  To break in new shoes, wear them for just a few hours a day. This prevents injuries on your feet. Wounds, scrapes, corns, and calluses  Check your feet daily for blisters, cuts, bruises, sores, and redness. If you cannot see the bottom of your feet, use a mirror or ask someone for help.  Do not cut corns or calluses or try to remove them with medicine.  If you  find a minor scrape, cut, or break in the skin on your feet, keep it and the skin around it clean and dry. You may clean these areas with mild soap and water. Do not clean the area with peroxide, alcohol, or iodine.  If you have a wound, scrape, corn, or callus on your foot, look at it several times a day to make sure it is healing and not infected. Check for: ? Redness, swelling, or pain. ? Fluid or blood. ? Warmth. ? Pus or a bad smell. General instructions  Do not cross your legs. This may decrease blood flow to your feet.  Do not use heating pads or hot water bottles on your feet. They may burn your skin. If you have lost feeling in your feet or legs, you may not know this is happening until it is too late.  Protect your feet from hot and cold by wearing shoes, such as at the beach or on hot pavement.  Schedule a complete foot exam at least once a year (annually) or more often if you have foot problems. If you have foot problems, report any cuts, sores, or bruises to your health care provider immediately. Contact a health care provider if:  You have a medical condition that increases your risk of infection and you have any cuts, sores, or bruises on your feet.  You have an injury that is not   healing.  You have redness on your legs or feet.  You feel burning or tingling in your legs or feet.  You have pain or cramps in your legs and feet.  Your legs or feet are numb.  Your feet always feel cold.  You have pain around a toenail. Get help right away if:  You have a wound, scrape, corn, or callus on your foot and: ? You have pain, swelling, or redness that gets worse. ? You have fluid or blood coming from the wound, scrape, corn, or callus. ? Your wound, scrape, corn, or callus feels warm to the touch. ? You have pus or a bad smell coming from the wound, scrape, corn, or callus. ? You have a fever. ? You have a red line going up your leg. Summary  Check your feet every day  for cuts, sores, red spots, swelling, and blisters.  Moisturize feet and legs daily.  Wear shoes that fit properly and have enough cushioning.  If you have foot problems, report any cuts, sores, or bruises to your health care provider immediately.  Schedule a complete foot exam at least once a year (annually) or more often if you have foot problems. This information is not intended to replace advice given to you by your health care provider. Make sure you discuss any questions you have with your health care provider. Document Released: 04/13/2000 Document Revised: 05/29/2017 Document Reviewed: 05/18/2016 Elsevier Patient Education  2020 [employer]   Onychomycosis/Fungal Toenails  WHAT IS IT? An infection that lies within the keratin of your nail plate that is caused by a fungus.  WHY ME? Fungal infections affect all ages, sexes, races, and creeds.  There may be many factors that predispose you to a fungal infection such as age, coexisting medical conditions such as diabetes, or an autoimmune disease; stress, medications, fatigue, genetics, etc.  Bottom line: fungus thrives in a warm, moist environment and your shoes offer such a location.  IS IT CONTAGIOUS? Theoretically, yes.  You do not want to share shoes, nail clippers or files with someone who has fungal toenails.  Walking around barefoot in the same room or sleeping in the same bed is unlikely to transfer the organism.  It is important to realize, however, that fungus can spread easily from one nail to the next on the same foot.  HOW DO WE TREAT THIS?  There are several ways to treat this condition.  Treatment may depend on many factors such as age, medications, pregnancy, liver and kidney conditions, etc.  It is best to ask your doctor which options are available to you.  1. No treatment.   Unlike many other medical concerns, you can live with this condition.  However for many people this can be a painful condition and may lead to  ingrown toenails or a bacterial infection.  It is recommended that you keep the nails cut short to help reduce the amount of fungal nail. 2. Topical treatment.  These range from herbal remedies to prescription strength nail lacquers.  About 40-50% effective, topicals require twice daily application for approximately 9 to 12 months or until an entirely new nail has grown out.  The most effective topicals are medical grade medications available through physicians offices. 3. Oral antifungal medications.  With an 80-90% cure rate, the most common oral medication requires 3 to 4 months of therapy and stays in your system for a year as the new nail grows out.  Oral antifungal medications do require   blood work to make sure it is a safe drug for you.  A liver function panel will be performed prior to starting the medication and after the first month of treatment.  It is important to have the blood work performed to avoid any harmful side effects.  In general, this medication safe but blood work is required. 4. Laser Therapy.  This treatment is performed by applying a specialized laser to the affected nail plate.  This therapy is noninvasive, fast, and non-painful.  It is not covered by insurance and is therefore, out of pocket.  The results have been very good with a 80-95% cure rate.  The [facility] is the only practice in the area to offer this therapy. 5. Permanent Nail Avulsion.  Removing the entire nail so that a new nail will not grow back. 

## 2019-02-05 ENCOUNTER — Encounter: Payer: Self-pay | Admitting: Podiatry

## 2019-02-05 NOTE — Progress Notes (Signed)
Subjective: Nathan Proctor. presents today referred by Nolene Ebbs, MD for diabetic foot evaluation.  Patient relates 10 year history of diabetes.  Patient denies any history of foot wounds.  Patient admits occasional episodes of numbness, tingling, burning, pins/needles sensations in feet.  He does take gabapentin for this.   Today, patient c/o of painful, discolored, thick toenails which interfere with daily activities.  Pain is aggravated when wearing enclosed shoe gear.   He states he has not taken any of his medications today. He is feeling fine. No headaches, no dizziness.   Past Medical History:  Diagnosis Date  . Anemia of chronic disease   . Ankle fracture 02/15/2016  . Cancer [facility])    Prostate  . Chronic constipation   . Chronic kidney disease    stage III  . Diabetes mellitus without complication ([place])    type II   . Diabetic peripheral neuropathy ([place])   . Failure to thrive (0-17)   . Fracture of left lower leg   . Gout   . Hyperlipidemia   . Hypertension   . Morbid obesity ([place])   . Unstable gait     Patient Active Problem List   Diagnosis Date Noted  . Ankle fracture, left 02/29/2016  . Closed left ankle fracture 02/29/2016  . Gout 02/20/2016  . Essential hypertension 02/20/2016    Past Surgical History:  Procedure Laterality Date  . ORIF ANKLE FRACTURE Left 02/29/2016   Procedure: OPEN REDUCTION INTERNAL FIXATION (ORIF) ANKLE FRACTURE;  Surgeon: Nicholes Stairs, MD;  Location: [facility]S;  Service: Orthopedics;  Laterality: Left;  . PROSTATE SURGERY      Current Outpatient Medications on File Prior to Visit  Medication Sig Dispense Refill  . acetaminophen (TYLENOL) 500 MG tablet Take 1,000 mg by mouth every 8 (eight) hours as needed for mild pain.    Marland Kitchen allopurinol (ZYLOPRIM) 100 MG tablet Take 100 mg by mouth daily.     Marland Kitchen amLODipine (NORVASC) 10 MG tablet Take 10 mg by mouth daily.     Marland Kitchen atorvastatin (LIPITOR) 40 MG tablet Take 40 mg by  mouth daily.     . carvedilol (COREG) 25 MG tablet Take 25 mg by mouth daily.     Marland Kitchen docusate sodium (COLACE) 100 MG capsule Take 100 mg by mouth 2 (two) times daily.    Marland Kitchen gabapentin (NEURONTIN) 300 MG capsule Take 300 mg by mouth daily.     Marland Kitchen HYDROcodone-acetaminophen (NORCO/VICODIN) 5-325 MG tablet Take 1 tablet by mouth every 8 (eight) hours as needed for moderate pain.    Marland Kitchen insulin lispro protamine-lispro (HUMALOG 75/25 MIX) (75-25) 100 UNIT/ML SUSP injection Inject 60 Units into the skin daily with breakfast.    . lubiprostone (AMITIZA) 24 MCG capsule Take 24 mcg by mouth daily with breakfast.     . metFORMIN (GLUCOPHAGE-XR) 750 MG 24 hr tablet Take by mouth.    . Vitamin D, Ergocalciferol, (DRISDOL) 50000 units CAPS capsule Take 50,000 Units by mouth 2 (two) times a week. On Wed and Fri per [facility].     No current facility-administered medications on file prior to visit.      No Known Allergies   History reviewed. No pertinent family history.  Social History   Occupational History  . Not on file  Tobacco Use  . Smoking status: Former Smoker    Types: Cigarettes    Quit date: 1979    Years since quitting: 41.7  . Smokeless tobacco: Never Used  Substance and Sexual Activity  . Alcohol use: Yes    Comment: Occasionally  . Drug use: No  . Sexual activity: Not on file     There is no immunization history on file for this patient.  Review of systems: Positive Findings in bold print.  Constitutional:  chills, fatigue, fever, sweats, weight change Communication: Optometrist, sign Ecologist, hand writing, iPad/Android device Head: headaches, head injury Eyes: changes in vision, eye pain, glaucoma, cataracts, macular degeneration, diplopia, glare,  light sensitivity, eyeglasses or contacts, blindness Ears nose mouth throat: hearing impaired, hearing aids,  ringing in ears, deaf, sign language,  vertigo, nosebleeds,  rhinitis,  cold sores, snoring, swollen  glands Cardiovascular: HTN, edema, arrhythmia, pacemaker in place, defibrillator in place, chest pain/tightness, chronic anticoagulation, blood clot, heart failure, MI Peripheral Vascular: leg cramps, varicose veins, blood clots, lymphedema, varicosities Respiratory:  difficulty breathing, denies congestion, SOB, wheezing, cough, emphysema Gastrointestinal: change in appetite or weight, abdominal pain, constipation, diarrhea, nausea, vomiting, vomiting blood, change in bowel habits, abdominal pain, jaundice, rectal bleeding, hemorrhoids, GERD Genitourinary:  nocturia,  pain on urination, polyuria,  blood in urine, Foley catheter, urinary urgency, ESRD on hemodialysis Musculoskeletal: amputation, cramping, stiff joints, painful joints, decreased joint motion, fractures, OA, gout, hemiplegia, paraplegia, uses cane, wheelchair bound, uses walker, uses rollator Skin: +changes in toenails, color change, dryness, itching, mole changes,  rash, wound(s) Neurological: headaches, numbness in feet, paresthesias in feet, burning in feet, fainting,  seizures, change in speech,  headaches, memory problems/poor historian, cerebral palsy, weakness, paralysis, CVA, TIA Endocrine: diabetes, hypothyroidism, hyperthyroidism,  goiter, dry mouth, flushing, heat intolerance,  cold intolerance,  excessive thirst, denies polyuria,  nocturia Hematological:  easy bleeding, excessive bleeding, easy bruising, enlarged lymph nodes, on long term blood thinner, history of past transusions Allergy/immunological:  hives, eczema, frequent infections, multiple drug allergies, seasonal allergies, transplant recipient, multiple food allergies Psychiatric:  anxiety, depression, mood disorder, suicidal ideations, hallucinations, insomnia  Objective: Vitals:   02/03/19 0832  BP: (!) 151/90  Pulse: 95   Vascular Examination: Capillary refill time immediate x 10 digits  Dorsalis pedis pulses 2/4 b/l.  Posterior tibial pulses 2/4  b/l.  Digital hair sparse b/l.  Skin temperature gradient WNL b/l.  Dermatological Examination: Skin with normal turgor, texture and tone b/l.  Toenails 1-5 b/l discolored, thick, dystrophic with subungual debris and pain with palpation to nailbeds due to thickness of nails.  Musculoskeletal: Muscle strength 5/5 to all LE muscle groups b/l.  Pes planus foot deformity b/l.  Hammertoes 2-5 b/l.  Neurological: Sensation intact 5/5 b/l  with 10 gram monofilament.  Vibratory sensation intact b/l.  Assessment: 1. Painful onychomycosis toenails 1-5 b/l  2. Hammertoes 2-5 b/l 3. Pes planus foot deformity b/l 4. NIDDM with polyneuropathy  Plan: 1. Discussed diabetic foot care principles. Literature dispensed on today. 2. Toenails 1-5 b/l were debrided in length and girth without iatrogenic bleeding. 3. Per Medicare guidelines, patient's feet need to be evaluated by an MD/DO managing patient's diabetes and diabetic shoe certification form needs to be signed by the MD/DO. If patient's diabetes is being managed by an Endocrinologist, the Endocrinologist must evaluate patient's feet and sign the Medicare diabetic shoe certification form.Discussed diabetic shoe program. He is interested and qualifies based on exam today.  4. Patient to continue soft, supportive shoe gear. 5. Patient to report any pedal injuries to medical professional immediately. 6. Follow up 3 months.  7. Patient/POA to call should there be a concern in the interim.

## 2019-02-12 ENCOUNTER — Other Ambulatory Visit: Payer: Self-pay

## 2019-02-12 ENCOUNTER — Ambulatory Visit: Payer: Medicare Other | Admitting: Orthotics

## 2019-02-12 DIAGNOSIS — M79674 Pain in right toe(s): Secondary | ICD-10-CM

## 2019-02-12 DIAGNOSIS — B351 Tinea unguium: Secondary | ICD-10-CM

## 2019-02-12 DIAGNOSIS — M2141 Flat foot [pes planus] (acquired), right foot: Secondary | ICD-10-CM

## 2019-02-12 DIAGNOSIS — M2142 Flat foot [pes planus] (acquired), left foot: Secondary | ICD-10-CM

## 2019-02-12 DIAGNOSIS — M79675 Pain in left toe(s): Secondary | ICD-10-CM

## 2019-02-12 NOTE — Progress Notes (Signed)

## 2019-03-18 ENCOUNTER — Other Ambulatory Visit: Payer: Self-pay

## 2019-03-18 ENCOUNTER — Ambulatory Visit: Payer: Medicare Other | Admitting: Orthotics

## 2019-03-18 DIAGNOSIS — M2142 Flat foot [pes planus] (acquired), left foot: Secondary | ICD-10-CM

## 2019-03-18 DIAGNOSIS — E119 Type 2 diabetes mellitus without complications: Secondary | ICD-10-CM

## 2019-03-18 DIAGNOSIS — M2041 Other hammer toe(s) (acquired), right foot: Secondary | ICD-10-CM | POA: Diagnosis not present

## 2019-03-18 DIAGNOSIS — M2042 Other hammer toe(s) (acquired), left foot: Secondary | ICD-10-CM

## 2019-03-18 DIAGNOSIS — M2141 Flat foot [pes planus] (acquired), right foot: Secondary | ICD-10-CM | POA: Diagnosis not present

## 2019-05-04 ENCOUNTER — Ambulatory Visit: Payer: Medicare Other | Admitting: Podiatry

## 2019-05-14 DIAGNOSIS — E1142 Type 2 diabetes mellitus with diabetic polyneuropathy: Secondary | ICD-10-CM | POA: Diagnosis not present

## 2019-05-14 DIAGNOSIS — Z79899 Other long term (current) drug therapy: Secondary | ICD-10-CM | POA: Diagnosis not present

## 2019-05-14 DIAGNOSIS — M19012 Primary osteoarthritis, left shoulder: Secondary | ICD-10-CM | POA: Diagnosis not present

## 2019-05-14 DIAGNOSIS — R6889 Other general symptoms and signs: Secondary | ICD-10-CM | POA: Diagnosis not present

## 2019-05-14 DIAGNOSIS — Z0000 Encounter for general adult medical examination without abnormal findings: Secondary | ICD-10-CM | POA: Diagnosis not present

## 2019-05-14 DIAGNOSIS — I10 Essential (primary) hypertension: Secondary | ICD-10-CM | POA: Diagnosis not present

## 2019-05-14 DIAGNOSIS — E7849 Other hyperlipidemia: Secondary | ICD-10-CM | POA: Diagnosis not present

## 2019-07-09 DIAGNOSIS — R32 Unspecified urinary incontinence: Secondary | ICD-10-CM | POA: Diagnosis not present

## 2019-07-09 DIAGNOSIS — Z794 Long term (current) use of insulin: Secondary | ICD-10-CM | POA: Diagnosis not present

## 2019-07-09 DIAGNOSIS — Z6841 Body Mass Index (BMI) 40.0 and over, adult: Secondary | ICD-10-CM | POA: Diagnosis not present

## 2019-07-09 DIAGNOSIS — E785 Hyperlipidemia, unspecified: Secondary | ICD-10-CM | POA: Diagnosis not present

## 2019-07-09 DIAGNOSIS — Z7982 Long term (current) use of aspirin: Secondary | ICD-10-CM | POA: Diagnosis not present

## 2019-07-09 DIAGNOSIS — I10 Essential (primary) hypertension: Secondary | ICD-10-CM | POA: Diagnosis not present

## 2019-07-09 DIAGNOSIS — E1142 Type 2 diabetes mellitus with diabetic polyneuropathy: Secondary | ICD-10-CM | POA: Diagnosis not present

## 2019-07-09 DIAGNOSIS — K5900 Constipation, unspecified: Secondary | ICD-10-CM | POA: Diagnosis not present

## 2019-07-09 DIAGNOSIS — N529 Male erectile dysfunction, unspecified: Secondary | ICD-10-CM | POA: Diagnosis not present

## 2019-08-13 DIAGNOSIS — M19012 Primary osteoarthritis, left shoulder: Secondary | ICD-10-CM | POA: Diagnosis not present

## 2019-08-13 DIAGNOSIS — E1142 Type 2 diabetes mellitus with diabetic polyneuropathy: Secondary | ICD-10-CM | POA: Diagnosis not present

## 2019-08-13 DIAGNOSIS — I10 Essential (primary) hypertension: Secondary | ICD-10-CM | POA: Diagnosis not present

## 2019-08-13 DIAGNOSIS — E0842 Diabetes mellitus due to underlying condition with diabetic polyneuropathy: Secondary | ICD-10-CM | POA: Diagnosis not present

## 2019-09-24 DIAGNOSIS — K5900 Constipation, unspecified: Secondary | ICD-10-CM | POA: Diagnosis not present

## 2019-09-24 DIAGNOSIS — M19012 Primary osteoarthritis, left shoulder: Secondary | ICD-10-CM | POA: Diagnosis not present

## 2019-09-24 DIAGNOSIS — I10 Essential (primary) hypertension: Secondary | ICD-10-CM | POA: Diagnosis not present

## 2019-09-24 DIAGNOSIS — E1142 Type 2 diabetes mellitus with diabetic polyneuropathy: Secondary | ICD-10-CM | POA: Diagnosis not present

## 2019-10-03 ENCOUNTER — Encounter (HOSPITAL_COMMUNITY): Payer: Self-pay

## 2019-10-03 ENCOUNTER — Emergency Department (HOSPITAL_COMMUNITY): Payer: Medicare PPO

## 2019-10-03 ENCOUNTER — Inpatient Hospital Stay (HOSPITAL_COMMUNITY)
Admission: EM | Admit: 2019-10-03 | Discharge: 2019-10-07 | DRG: 065 | Disposition: A | Discharge status: Rehabilitation Facility | Payer: Medicare PPO | Attending: Family Medicine | Admitting: Family Medicine

## 2019-10-03 DIAGNOSIS — M109 Gout, unspecified: Secondary | ICD-10-CM | POA: Diagnosis present

## 2019-10-03 DIAGNOSIS — I63512 Cerebral infarction due to unspecified occlusion or stenosis of left middle cerebral artery: Secondary | ICD-10-CM | POA: Diagnosis not present

## 2019-10-03 DIAGNOSIS — K5909 Other constipation: Secondary | ICD-10-CM | POA: Diagnosis present

## 2019-10-03 DIAGNOSIS — Z8546 Personal history of malignant neoplasm of prostate: Secondary | ICD-10-CM

## 2019-10-03 DIAGNOSIS — I10 Essential (primary) hypertension: Secondary | ICD-10-CM | POA: Diagnosis present

## 2019-10-03 DIAGNOSIS — G8191 Hemiplegia, unspecified affecting right dominant side: Secondary | ICD-10-CM | POA: Diagnosis present

## 2019-10-03 DIAGNOSIS — I429 Cardiomyopathy, unspecified: Secondary | ICD-10-CM | POA: Diagnosis present

## 2019-10-03 DIAGNOSIS — I130 Hypertensive heart and chronic kidney disease with heart failure and stage 1 through stage 4 chronic kidney disease, or unspecified chronic kidney disease: Secondary | ICD-10-CM | POA: Diagnosis present

## 2019-10-03 DIAGNOSIS — D638 Anemia in other chronic diseases classified elsewhere: Secondary | ICD-10-CM | POA: Diagnosis present

## 2019-10-03 DIAGNOSIS — Z79899 Other long term (current) drug therapy: Secondary | ICD-10-CM

## 2019-10-03 DIAGNOSIS — Z87891 Personal history of nicotine dependence: Secondary | ICD-10-CM

## 2019-10-03 DIAGNOSIS — E1142 Type 2 diabetes mellitus with diabetic polyneuropathy: Secondary | ICD-10-CM | POA: Diagnosis present

## 2019-10-03 DIAGNOSIS — E785 Hyperlipidemia, unspecified: Secondary | ICD-10-CM

## 2019-10-03 DIAGNOSIS — D539 Nutritional anemia, unspecified: Secondary | ICD-10-CM | POA: Diagnosis present

## 2019-10-03 DIAGNOSIS — I639 Cerebral infarction, unspecified: Secondary | ICD-10-CM | POA: Diagnosis present

## 2019-10-03 DIAGNOSIS — T383X5A Adverse effect of insulin and oral hypoglycemic [antidiabetic] drugs, initial encounter: Secondary | ICD-10-CM

## 2019-10-03 DIAGNOSIS — N1831 Chronic kidney disease, stage 3a: Secondary | ICD-10-CM | POA: Diagnosis present

## 2019-10-03 DIAGNOSIS — I6522 Occlusion and stenosis of left carotid artery: Secondary | ICD-10-CM | POA: Diagnosis present

## 2019-10-03 DIAGNOSIS — I5042 Chronic combined systolic (congestive) and diastolic (congestive) heart failure: Secondary | ICD-10-CM | POA: Diagnosis present

## 2019-10-03 DIAGNOSIS — N183 Chronic kidney disease, stage 3 unspecified: Secondary | ICD-10-CM

## 2019-10-03 DIAGNOSIS — I472 Ventricular tachycardia: Secondary | ICD-10-CM

## 2019-10-03 DIAGNOSIS — R471 Dysarthria and anarthria: Secondary | ICD-10-CM | POA: Diagnosis present

## 2019-10-03 DIAGNOSIS — E860 Dehydration: Secondary | ICD-10-CM

## 2019-10-03 DIAGNOSIS — R414 Neurologic neglect syndrome: Secondary | ICD-10-CM | POA: Diagnosis present

## 2019-10-03 DIAGNOSIS — R4781 Slurred speech: Secondary | ICD-10-CM | POA: Diagnosis present

## 2019-10-03 DIAGNOSIS — E160 Drug-induced hypoglycemia without coma: Secondary | ICD-10-CM

## 2019-10-03 DIAGNOSIS — E1122 Type 2 diabetes mellitus with diabetic chronic kidney disease: Secondary | ICD-10-CM | POA: Diagnosis present

## 2019-10-03 DIAGNOSIS — E11319 Type 2 diabetes mellitus with unspecified diabetic retinopathy without macular edema: Secondary | ICD-10-CM | POA: Diagnosis present

## 2019-10-03 DIAGNOSIS — Z794 Long term (current) use of insulin: Secondary | ICD-10-CM

## 2019-10-03 DIAGNOSIS — I4729 Other ventricular tachycardia: Secondary | ICD-10-CM

## 2019-10-03 DIAGNOSIS — Z20822 Contact with and (suspected) exposure to covid-19: Secondary | ICD-10-CM | POA: Diagnosis present

## 2019-10-03 DIAGNOSIS — Z6841 Body Mass Index (BMI) 40.0 and over, adult: Secondary | ICD-10-CM

## 2019-10-03 DIAGNOSIS — E11649 Type 2 diabetes mellitus with hypoglycemia without coma: Secondary | ICD-10-CM | POA: Diagnosis present

## 2019-10-03 LAB — APTT: aPTT: 30 seconds (ref 24–36)

## 2019-10-03 LAB — CBC
HCT: 36.6 % — ABNORMAL LOW (ref 39.0–52.0)
Hemoglobin: 11.2 g/dL — ABNORMAL LOW (ref 13.0–17.0)
MCH: 31 pg (ref 26.0–34.0)
MCHC: 30.6 g/dL (ref 30.0–36.0)
MCV: 101.4 fL — ABNORMAL HIGH (ref 80.0–100.0)
Platelets: 180 10*3/uL (ref 150–400)
RBC: 3.61 MIL/uL — ABNORMAL LOW (ref 4.22–5.81)
RDW: 13.3 % (ref 11.5–15.5)
WBC: 6.8 10*3/uL (ref 4.0–10.5)
nRBC: 0 % (ref 0.0–0.2)

## 2019-10-03 LAB — COMPREHENSIVE METABOLIC PANEL
ALT: 12 U/L (ref 0–44)
AST: 15 U/L (ref 15–41)
Albumin: 3.7 g/dL (ref 3.5–5.0)
Alkaline Phosphatase: 68 U/L (ref 38–126)
Anion gap: 7 (ref 5–15)
BUN: 31 mg/dL — ABNORMAL HIGH (ref 8–23)
CO2: 23 mmol/L (ref 22–32)
Calcium: 9.1 mg/dL (ref 8.9–10.3)
Chloride: 113 mmol/L — ABNORMAL HIGH (ref 98–111)
Creatinine, Ser: 1.85 mg/dL — ABNORMAL HIGH (ref 0.61–1.24)
GFR calc Af Amer: 39 mL/min — ABNORMAL LOW (ref >60)
GFR calc non Af Amer: 34 mL/min — ABNORMAL LOW (ref >60)
Glucose, Bld: 66 mg/dL — ABNORMAL LOW (ref 70–99)
Potassium: 4.3 mmol/L (ref 3.5–5.1)
Sodium: 143 mmol/L (ref 135–145)
Total Bilirubin: 1.1 mg/dL (ref 0.3–1.2)
Total Protein: 6.5 g/dL (ref 6.5–8.1)

## 2019-10-03 LAB — I-STAT CHEM 8, ED
BUN: 30 mg/dL — ABNORMAL HIGH (ref 8–23)
Calcium, Ion: 1.19 mmol/L (ref 1.15–1.40)
Chloride: 111 mmol/L (ref 98–111)
Creatinine, Ser: 1.9 mg/dL — ABNORMAL HIGH (ref 0.61–1.24)
Glucose, Bld: 61 mg/dL — ABNORMAL LOW (ref 70–99)
HCT: 33 % — ABNORMAL LOW (ref 39.0–52.0)
Hemoglobin: 11.2 g/dL — ABNORMAL LOW (ref 13.0–17.0)
Potassium: 4.1 mmol/L (ref 3.5–5.1)
Sodium: 145 mmol/L (ref 135–145)
TCO2: 22 mmol/L (ref 22–32)

## 2019-10-03 LAB — CBG MONITORING, ED
Glucose-Capillary: 57 mg/dL — ABNORMAL LOW (ref 70–99)
Glucose-Capillary: 69 mg/dL — ABNORMAL LOW (ref 70–99)
Glucose-Capillary: 86 mg/dL (ref 70–99)

## 2019-10-03 LAB — DIFFERENTIAL
Abs Immature Granulocytes: 0.02 10*3/uL (ref 0.00–0.07)
Basophils Absolute: 0 10*3/uL (ref 0.0–0.1)
Basophils Relative: 0 %
Eosinophils Absolute: 0.1 10*3/uL (ref 0.0–0.5)
Eosinophils Relative: 1 %
Immature Granulocytes: 0 %
Lymphocytes Relative: 21 %
Lymphs Abs: 1.4 10*3/uL (ref 0.7–4.0)
Monocytes Absolute: 0.6 10*3/uL (ref 0.1–1.0)
Monocytes Relative: 9 %
Neutro Abs: 4.7 10*3/uL (ref 1.7–7.7)
Neutrophils Relative %: 69 %

## 2019-10-03 LAB — PROTIME-INR
INR: 1.1 (ref 0.8–1.2)
Prothrombin Time: 13.9 seconds (ref 11.4–15.2)

## 2019-10-03 IMAGING — CT CT HEAD CODE STROKE
3 series · 15 of 47 positions shown, 18 images · non-contrast
Comparison: No pertinent prior studies available for comparison.

CLINICAL DATA: Code stroke. Possible stroke, neuro deficit, acute,
stroke suspected. Additional history obtained from electronic
medical record: Right arm weakness, numbness and neglect, last known
normal [DATE] at [DATE]

EXAM:
CT HEAD WITHOUT CONTRAST
TECHNIQUE: Contiguous axial images were obtained from the base of the skull
through the vertex without intravenous contrast.

[Series 3: head 5.0 st · axial · 0.46mm/px · z∈[-184,-29]mm · 9 of 37 slices shown, 12 images]
[im 3/37  brain]
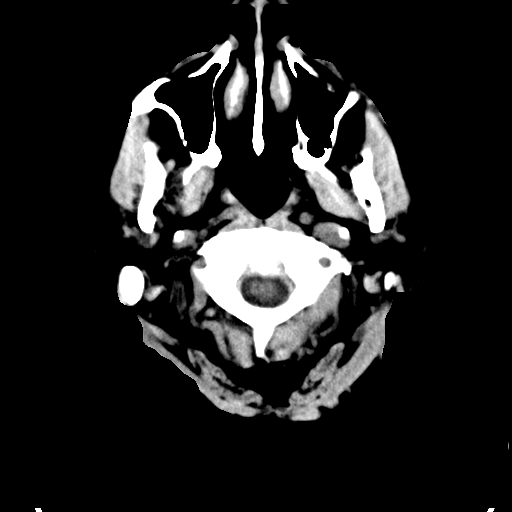
[im 3/37  bone]
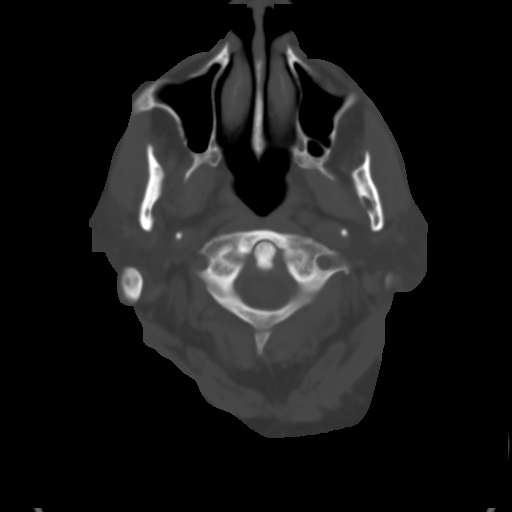
[im 7/37  brain]
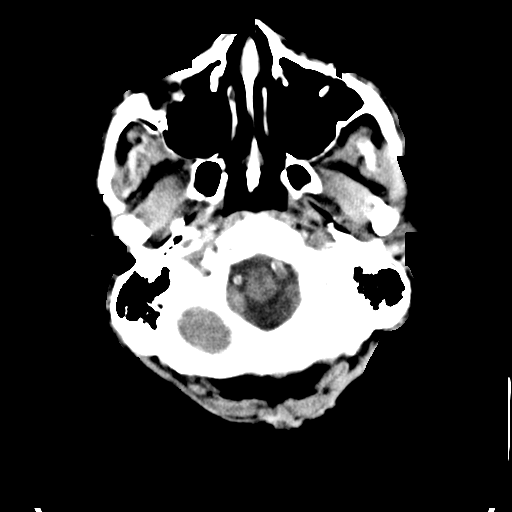
[im 10/37  brain]
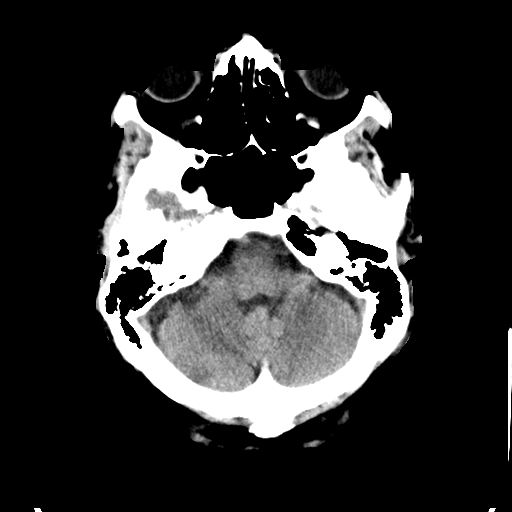
[im 14/37  brain]
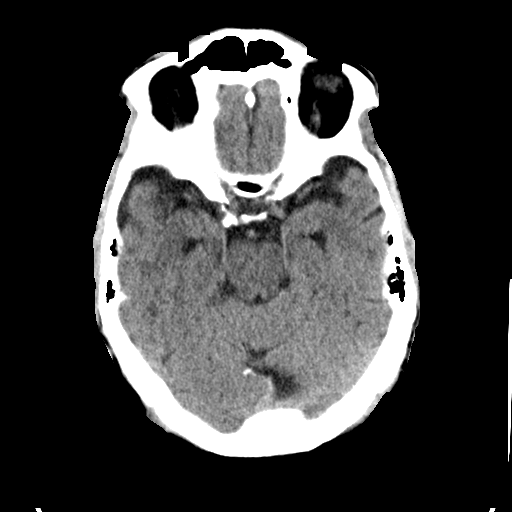
[im 19/37  brain]
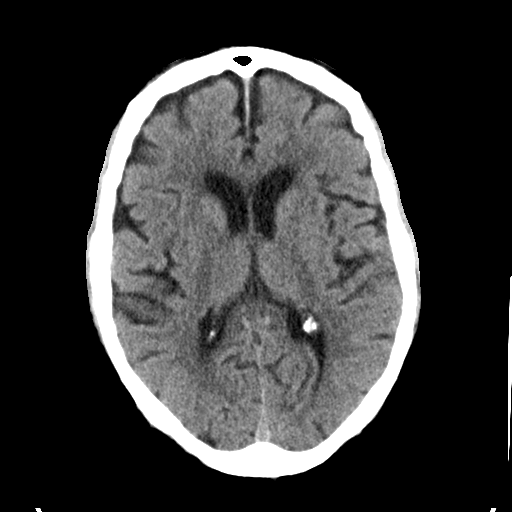
[im 19/37  bone]
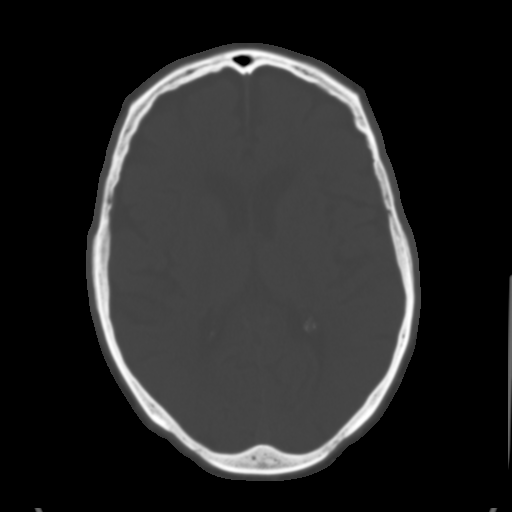
[im 23/37  brain]
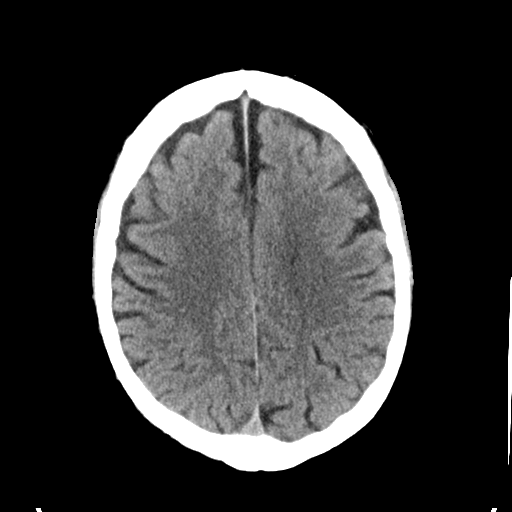
[im 27/37  brain]
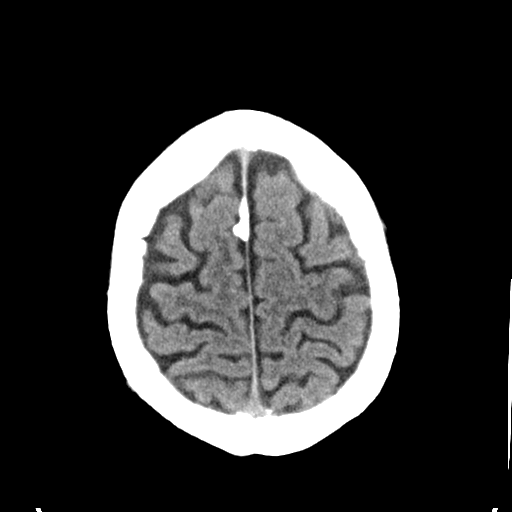
[im 30/37  brain]
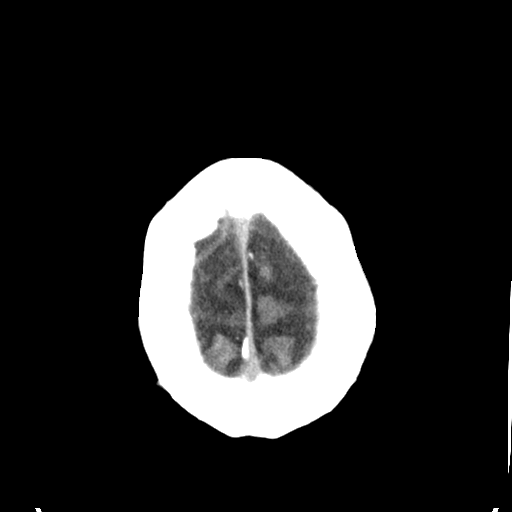
[im 34/37  brain]
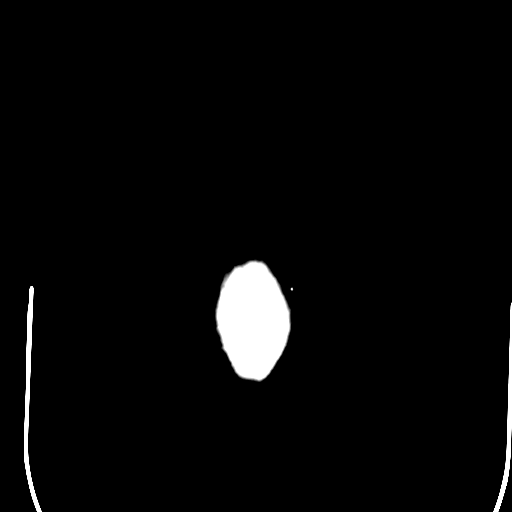
[im 34/37  bone]
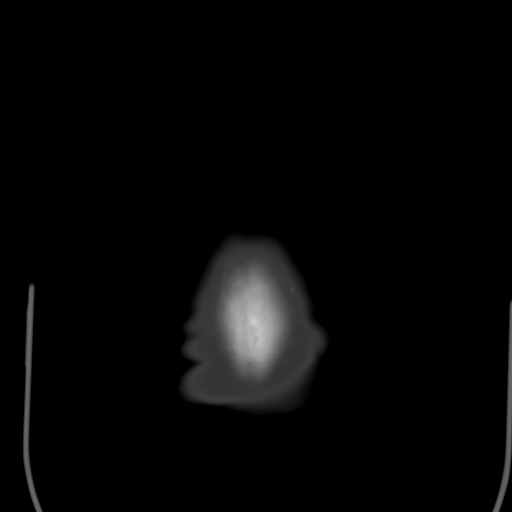

[Series 5: head 3.0 cor st · coronal · 0.34mm/px · 3 of 77 slices shown]
[im 26/77  brain]
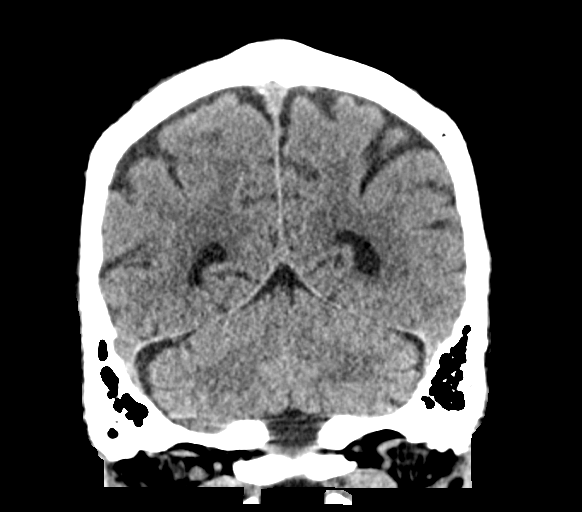
[im 34/77  brain]
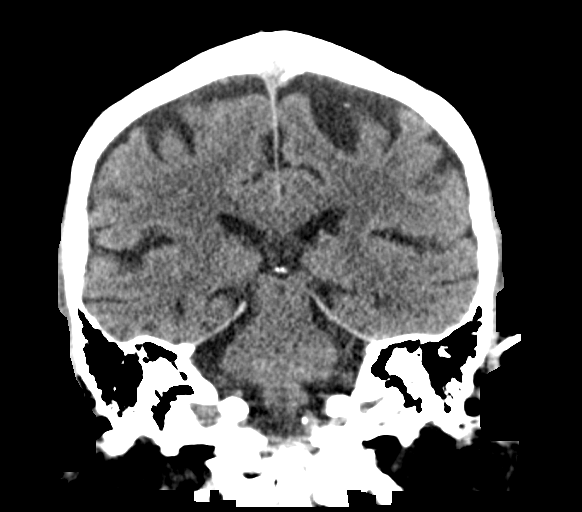
[im 43/77  brain]
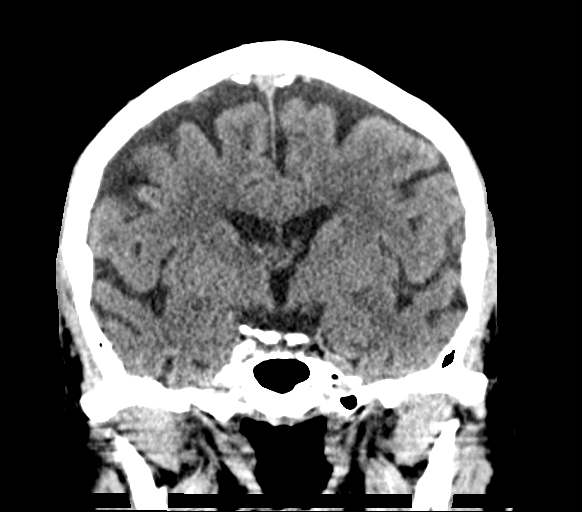

[Series 6: head 3.0 sag st · sagittal · 0.34mm/px · 3 of 66 slices shown]
[im 22/66  brain]
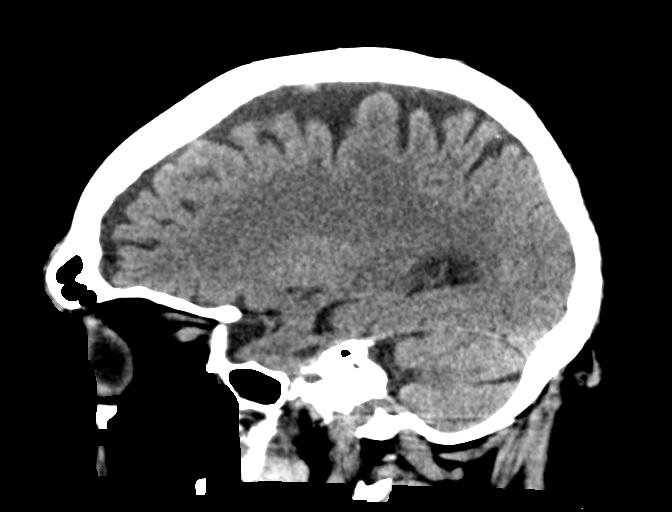
[im 33/66  brain]
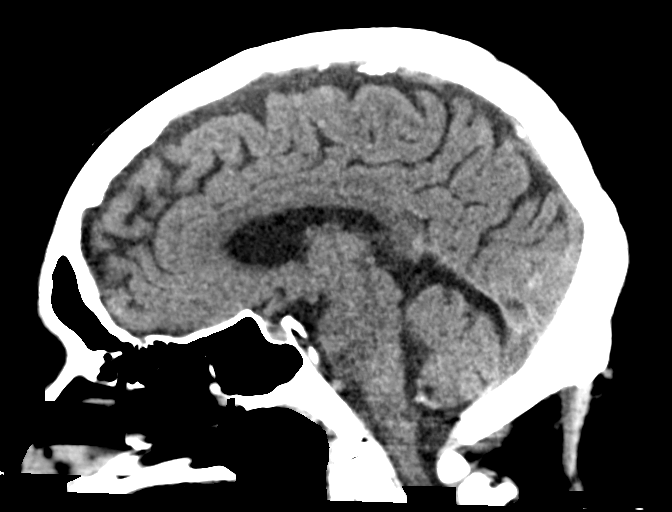
[im 44/66  brain]
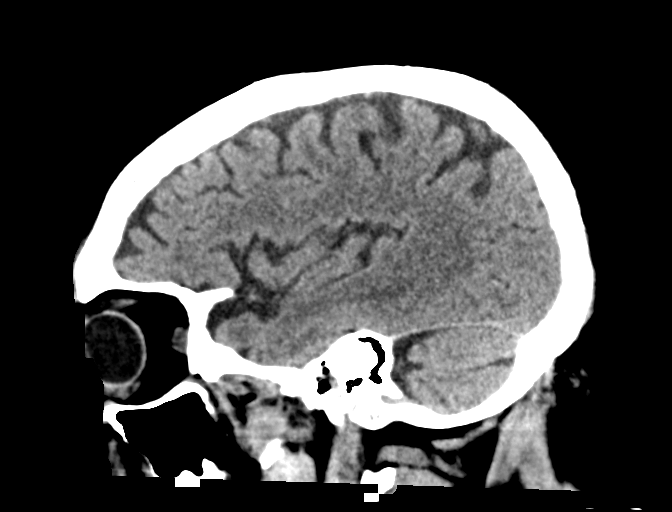

[15 of 47 positions shown; findings below may reference images not displayed]

FINDINGS: Brain:

There is mild generalized parenchymal atrophy.

Mild ill-defined hypoattenuation within the cerebral white matter is
nonspecific, but consistent with chronic small vessel ischemic
disease.

There is no acute intracranial hemorrhage.

No demarcated cortical infarct is identified.

No extra-axial fluid collection.

No evidence of intracranial mass.

No midline shift.

Vascular: No hyperdense vessel.  Atherosclerotic calcifications

Skull: Normal. Negative for fracture or focal lesion.

Sinuses/Orbits: Visualized orbits show no acute finding. No
significant paranasal sinus disease or mastoid effusion at the
imaged levels.

ASPECTS (Alberta Stroke Program Early CT Score)

- Ganglionic level infarction (caudate, lentiform nuclei, internal
capsule, insula, M1-M3 cortex): 7

- Supraganglionic infarction (M4-M6 cortex): 3

Total score (0-10 with 10 being normal): 10

These results were communicated to Dr. ARNULFO At [DATE] pmon
[DATE]by text page via the AMION messaging system.
IMPRESSION: 1. No CT evidence of acute intracranial abnormality.  ASPECTS is 10.
2. Mild generalized parenchymal atrophy and chronic small vessel
ischemic disease

## 2019-10-03 MED ORDER — DEXTROSE 5 % IV SOLN: Freq: Once | INTRAVENOUS | Status: AC

## 2019-10-03 MED ORDER — SODIUM CHLORIDE 0.9% FLUSH
3.0000 mL | Freq: Once | INTRAVENOUS | Status: AC
  Administered 2019-10-03: 3 mL via INTRAVENOUS

## 2019-10-03 MED ORDER — DEXTROSE 250 MG/ML IV SOLN
25.0000 g | Freq: Once | INTRAVENOUS | Status: AC
  Administered 2019-10-03: 25 g via INTRAVENOUS

## 2019-10-03 NOTE — ED Triage Notes (Signed)
Pt came in [facility] code stroke. Pt presents with Right Arm weakness, numbness and neglect/ LSK was 10/02/2019 around 2300. Pt woke up around 0500 and started to have these symptoms. CBG 57. [id number]

## 2019-10-03 NOTE — Consult Note (Addendum)
Neurology Consultation  Reason for Consult: code stroke Referring Physician: Dr Karle Starch, EDP  CC: right sided weakness  History is obtained from: chart, patient  HPI: Nathan Proctor. is a 79 y.o. male who has had a past medical history of diabetes, prostate cancer, CKD 3, diabetic neuropathy, morbid obesity, hypertension, hyperlipidemia, presented to the emergency room as an acute code stroke due to initial last known well of 5 AM today but upon further history taking, he went to bed, and around 11:59 PM on 10/02/2019 and woke up at 5 AM today with some right-sided weakness.  He did not make much of it but when the weakness did not improve, he called EMS.  On initial EMS evaluation, he did exhibit some right-sided neglect for which they activated a LVO positive code stroke.  At the ER bridge-blood glucose 57.  Given half ampule of D50 and taken for CT head. My examination details as documented below.  I do not appreciate visual or sensory neglect -clinical exam not significant for large vessel occlusion. Outside the window for TPA. Denies any chest pain shortness of breath cough fever nausea vomiting.  Denies any Covid contacts.  Noted that on arrival to the ER bridge-blood sugar 57.   LKW: 11:59 PM on 10/02/2019 tpa given?: no, outside the window Premorbid modified Rankin scale (mRS): 3    ROS: Performed and negative except noted in HPI  Past Medical History:  Diagnosis Date  . Anemia of chronic disease   . Ankle fracture 02/15/2016  . Cancer [facility])    Prostate  . Chronic constipation   . Chronic kidney disease    stage III  . Diabetes mellitus without complication ([place])    type II   . Diabetic peripheral neuropathy ([place])   . Failure to thrive (0-17)   . Fracture of left lower leg   . Gout   . Hyperlipidemia   . Hypertension   . Morbid obesity ([place])   . Unstable gait    No family history on file.   Social History:   reports that he quit smoking about 42 years ago.  His smoking use included cigarettes. He has never used smokeless tobacco. He reports current alcohol use. He reports that he does not use drugs.  Medications  Current Facility-Administered Medications:  .  sodium chloride flush (NS) 0.9 % injection 3 mL, 3 mL, Intravenous, Once, Truddie Hidden, MD  Current Outpatient Medications:  .  acetaminophen (TYLENOL) 500 MG tablet, Take 1,000 mg by mouth every 8 (eight) hours as needed for mild pain., Disp: , Rfl:  .  allopurinol (ZYLOPRIM) 100 MG tablet, Take 100 mg by mouth daily. , Disp: , Rfl:  .  amLODipine (NORVASC) 10 MG tablet, Take 10 mg by mouth daily. , Disp: , Rfl:  .  atorvastatin (LIPITOR) 40 MG tablet, Take 40 mg by mouth daily. , Disp: , Rfl:  .  carvedilol (COREG) 25 MG tablet, Take 25 mg by mouth daily. , Disp: , Rfl:  .  docusate sodium (COLACE) 100 MG capsule, Take 100 mg by mouth 2 (two) times daily., Disp: , Rfl:  .  gabapentin (NEURONTIN) 300 MG capsule, Take 300 mg by mouth daily. , Disp: , Rfl:  .  HYDROcodone-acetaminophen (NORCO/VICODIN) 5-325 MG tablet, Take 1 tablet by mouth every 8 (eight) hours as needed for moderate pain., Disp: , Rfl:  .  insulin lispro protamine-lispro (HUMALOG 75/25 MIX) (75-25) 100 UNIT/ML SUSP injection, Inject 60 Units into the skin  daily with breakfast., Disp: , Rfl:  .  lubiprostone (AMITIZA) 24 MCG capsule, Take 24 mcg by mouth daily with breakfast. , Disp: , Rfl:  .  metFORMIN (GLUCOPHAGE-XR) 750 MG 24 hr tablet, Take by mouth., Disp: , Rfl:  .  Vitamin D, Ergocalciferol, (DRISDOL) 50000 units CAPS capsule, Take 50,000 Units by mouth 2 (two) times a week. On Wed and Fri per [facility] Disp: , Rfl:   Exam: Current vital signs: BP 115/87   Pulse 72   Temp 98.1 F (36.7 C) (Oral)   Resp 16   Ht 5\' 10"  (1.778 m)   Wt (!) 142.4 kg   SpO2 97%   BMI 45.04 kg/m  Vital signs in last 24 hours: Temp:  [98.1 F (36.7 C)] 98.1 F (36.7 C) (06/05 2240) Pulse Rate:  [67-72] 72 (06/05 2245) Resp:   [16-32] 16 (06/05 2245) BP: (115-134)/(62-87) 115/87 (06/05 2245) SpO2:  [97 %-99 %] 97 % (06/05 2245) Weight:  [142.4 kg] 142.4 kg (06/05 2226) General: Awake alert in no distress HEENT: Normocephalic atraumatic dry oral mucous membranes Lungs: Clear to auscultation Cardiovascular: Regular rate rhythm Abdomen obese, nontender Extremities: Warm, well perfused with edema Neurological exam Awake alert oriented x3 Speech is mildly dysarthric No evidence of aphasia Slightly reduced attention concentration Cranial nerves: Pupils equal round react to light, extraocular movement intact with mild disconjugate gaze at rest with left exotropia, facial sensation intact, face symmetric, tongue and palate midline. Motor exam: Right upper extremity has a drift on outstretched arm, otherwise no drift in any of the other 3 extremities. Sensory exam intact light touch with no extinction Coordination: Intact finger-nose-finger NIH stroke scale-2  Labs I have reviewed labs in epic and the results pertinent to this consultation are:  CBC    Component Value Date/Time   WBC 7.7 04/04/2016 1615   RBC 3.97 (L) 04/04/2016 1615   HGB 11.6 (L) 04/04/2016 1615   HCT 36.3 (L) 04/04/2016 1615   PLT 239 04/04/2016 1615   MCV 91.4 04/04/2016 1615   MCH 29.2 04/04/2016 1615   MCHC 32.0 04/04/2016 1615   RDW 14.6 04/04/2016 1615   LYMPHSABS 1.6 04/04/2016 1615   MONOABS 0.6 04/04/2016 1615   EOSABS 0.2 04/04/2016 1615   BASOSABS 0.0 04/04/2016 1615    CMP     Component Value Date/Time   NA 142 04/04/2016 1615   NA 146 03/06/2016 0000   K 3.8 04/04/2016 1615   CL 105 04/04/2016 1615   CO2 28 04/04/2016 1615   GLUCOSE 75 04/04/2016 1615   BUN 21 (H) 04/04/2016 1615   BUN 20 03/06/2016 0000   CREATININE 1.48 (H) 04/04/2016 1615   CALCIUM 10.0 04/04/2016 1615   PROT 6.8 04/04/2016 1615   ALBUMIN 3.6 04/04/2016 1615   AST 20 04/04/2016 1615   ALT 15 (L) 04/04/2016 1615   ALKPHOS 73 04/04/2016  1615   BILITOT 0.6 04/04/2016 1615   GFRNONAA 44 (L) 04/04/2016 1615   GFRAA 51 (L) 04/04/2016 1615    Imaging I have reviewed the images obtained:  CT-scan of the brain-no acute change.  Aspects 10.  No bleed   Assessment: 79 year old man with above past medical history presenting with sudden onset of right arm weakness and slurred speech when he woke up this morning at 5 AM.  Last known normal was midnight last night. Outside the window for IV TPA Exam not suggestive of LVO. Vessel imaging with CTA was not performed due to deranged renal function. He  needs to be admitted for further work-up.  Other differentials to also considered are hypoglycemia which was present on presentation  Impression: Acute ischemic stroke Symptomatic hypoglycemia  Recommendations: Admit to hospitalist Telemetry Frequent neurochecks MRI brain without contrast MRA head without contrast MRA neck without contrast-due to deranged renal function 2D echo A1c Lipid panel Aspirin 325 Atorvastatin 80 PT OT next speech therapy N.p.o. until cleared by bedside swallow evaluation stroke screen or formal swallow evaluation. Stroke team will follow. Discussed with Dr. Karle Starch, ED provider at bedside.    -- Amie Portland, MD [facility] Pager: [phone] If 7pm to 7am, please call on call as listed on AMION.

## 2019-10-03 NOTE — ED Provider Notes (Signed)
[facility] EMERGENCY DE[facility]MENT Provider Note   CSN: [id number] Arrival date & time: 10/03/19  2213  An emergency department physician performed an initial assessment on this suspected stroke patient at 2214.  History No chief complaint on file.   Nathan Proctor. is a 79 y.o. male.  HPI Patient with multiple medical problems brought in by EMS as a Code Stroke due to R arm weakness and slurred speech, initially reported to have been last seen normal at 5am today however on further questioning it was before bed the night before (~24hrs ago).     Past Medical History:  Diagnosis Date  . Anemia of chronic disease   . Ankle fracture 02/15/2016  . Cancer [facility])    Prostate  . Chronic constipation   . Chronic kidney disease    stage III  . Diabetes mellitus without complication ([place])    type II   . Diabetic peripheral neuropathy ([place])   . Failure to thrive (0-17)   . Fracture of left lower leg   . Gout   . Hyperlipidemia   . Hypertension   . Morbid obesity ([place])   . Unstable gait     Patient Active Problem List   Diagnosis Date Noted  . Acute ischemic stroke ([place]) 10/04/2019  . Hypoglycemia due to insulin 10/04/2019  . CKD (chronic kidney disease), stage III 10/04/2019  . Dehydration 10/04/2019  . Dyslipidemia 10/04/2019  . Ankle fracture, left 02/29/2016  . Closed left ankle fracture 02/29/2016  . Gout 02/20/2016  . Essential hypertension 02/20/2016    Past Surgical History:  Procedure Laterality Date  . ORIF ANKLE FRACTURE Left 02/29/2016   Procedure: OPEN REDUCTION INTERNAL FIXATION (ORIF) ANKLE FRACTURE;  Surgeon: Nicholes Stairs, MD;  Location: [facility] ORS;  Service: Orthopedics;  Laterality: Left;  . PROSTATE SURGERY         No family history on file.  Social History   Tobacco Use  . Smoking status: Former Smoker    Types: Cigarettes    Quit date: 1979    Years since quitting: 42.4  . Smokeless tobacco: Never Used  Substance  Use Topics  . Alcohol use: Yes    Comment: Occasionally  . Drug use: No    Home Medications Prior to Admission medications   Medication Sig Start Date End Date Taking? Authorizing Provider  acetaminophen (TYLENOL) 500 MG tablet Take 1,000 mg by mouth every 8 (eight) hours as needed for mild pain.    [provider]  allopurinol (ZYLOPRIM) 100 MG tablet Take 100 mg by mouth daily.  01/25/16   [provider]  amLODipine (NORVASC) 10 MG tablet Take 10 mg by mouth daily.     [provider]  atorvastatin (LIPITOR) 40 MG tablet Take 40 mg by mouth daily.     [provider]  carvedilol (COREG) 25 MG tablet Take 25 mg by mouth daily.     [provider]  docusate sodium (COLACE) 100 MG capsule Take 100 mg by mouth 2 (two) times daily.    [provider]  gabapentin (NEURONTIN) 300 MG capsule Take 300 mg by mouth 3 (three) times daily.     [provider]  HYDROcodone-acetaminophen (NORCO/VICODIN) 5-325 MG tablet Take 1 tablet by mouth every 8 (eight) hours as needed for moderate pain.    [provider]  insulin NPH-regular Human (70-30) 100 UNIT/ML injection Inject 30 Units into the skin daily.    [provider]  lubiprostone (  AMITIZA) 24 MCG capsule Take 24 mcg by mouth daily with breakfast.     [provider]  tiZANidine (ZANAFLEX) 4 MG tablet Take 4 mg by mouth 2 (two) times daily as needed for muscle spasms. 08/13/19   [provider]  Vitamin D, Ergocalciferol, (DRISDOL) 50000 units CAPS capsule Take 50,000 Units by mouth 2 (two) times a week. On Wed and Fri per [facility].    [provider]    Allergies    Patient has no known allergies.  Review of Systems   Review of Systems A comprehensive review of systems was completed and negative except as noted in HPI.   Physical Exam Updated Vital Signs BP 130/63   Pulse 74   Temp 97.9 F (36.6 C)   Resp 19   Ht 5\' 10"  (1.778 m)   Wt  (!) 141.6 kg   SpO2 98%   BMI 44.79 kg/m   Physical Exam Vitals and nursing note reviewed.  Constitutional:      Appearance: Normal appearance.  HENT:     Head: Normocephalic and atraumatic.     Nose: Nose normal.     Mouth/Throat:     Mouth: Mucous membranes are moist.  Eyes:     Extraocular Movements: Extraocular movements intact.     Conjunctiva/sclera: Conjunctivae normal.  Cardiovascular:     Rate and Rhythm: Normal rate.  Pulmonary:     Effort: Pulmonary effort is normal.     Breath sounds: Normal breath sounds.  Abdominal:     General: Abdomen is flat.     Palpations: Abdomen is soft.     Tenderness: There is no abdominal tenderness.  Musculoskeletal:        General: No swelling. Normal range of motion.     Cervical back: Neck supple.  Skin:    General: Skin is warm and dry.  Neurological:     Mental Status: He is alert.     Comments: R arm weakness, mild dysarthria, see Stroke MD notes for full NIHSS.  Psychiatric:        Mood and Affect: Mood normal.     ED Results / Procedures / Treatments   Labs (all labs ordered are listed, but only abnormal results are displayed) Labs Reviewed  CBC - Abnormal; Notable for the following components:      Result Value   RBC 3.61 (*)    Hemoglobin 11.2 (*)    HCT 36.6 (*)    MCV 101.4 (*)    All other components within normal limits  COMPREHENSIVE METABOLIC PANEL - Abnormal; Notable for the following components:   Chloride 113 (*)    Glucose, Bld 66 (*)    BUN 31 (*)    Creatinine, Ser 1.85 (*)    GFR calc non Af Amer 34 (*)    GFR calc Af Amer 39 (*)    All other components within normal limits  COMPREHENSIVE METABOLIC PANEL - Abnormal; Notable for the following components:   Glucose, Bld 251 (*)    BUN 30 (*)    Creatinine, Ser 1.59 (*)    Total Protein 6.1 (*)    Albumin 3.2 (*)    GFR calc non Af Amer 40 (*)    GFR calc Af Amer 47 (*)    All other components within normal limits  CBC - Abnormal; Notable  for the following components:   RBC 3.59 (*)    Hemoglobin 10.9 (*)    HCT 35.0 (*)  All other components within normal limits  GLUCOSE, CAPILLARY - Abnormal; Notable for the following components:   Glucose-Capillary 101 (*)    All other components within normal limits  LIPID PANEL - Abnormal; Notable for the following components:   LDL Cholesterol 105 (*)    All other components within normal limits  GLUCOSE, CAPILLARY - Abnormal; Notable for the following components:   Glucose-Capillary 239 (*)    All other components within normal limits  GLUCOSE, CAPILLARY - Abnormal; Notable for the following components:   Glucose-Capillary 145 (*)    All other components within normal limits  I-STAT CHEM 8, ED - Abnormal; Notable for the following components:   BUN 30 (*)    Creatinine, Ser 1.90 (*)    Glucose, Bld 61 (*)    Hemoglobin 11.2 (*)    HCT 33.0 (*)    All other components within normal limits  CBG MONITORING, ED - Abnormal; Notable for the following components:   Glucose-Capillary 57 (*)    All other components within normal limits  CBG MONITORING, ED - Abnormal; Notable for the following components:   Glucose-Capillary 69 (*)    All other components within normal limits  SARS CORONAVIRUS 2 BY RT PCR (HOSPITAL ORDER, [place] [facility])  PROTIME-INR  APTT  DIFFERENTIAL  PROTIME-INR  APTT  MAGNESIUM  PHOSPHORUS  HEMOGLOBIN A1C  CBG MONITORING, ED  CBG MONITORING, ED  CBG MONITORING, ED  CBG MONITORING, ED  CBG MONITORING, ED  CBG MONITORING, ED    EKG EKG Interpretation  Date/Time:  Saturday October 03 2019 22:38:39 EDT Ventricular Rate:  68 PR Interval:    QRS Duration: 86 QT Interval:  358 QTC Calculation: 381 R Axis:   61 Text Interpretation: Sinus rhythm Atrial premature complex Minimal ST depression, inferior leads No significant change since last tracing Confirmed by Calvert Cantor [phone]) on 10/03/2019 10:58:35 PM   Radiology MR  ANGIO HEAD WO CONTRAST  Result Date: 10/04/2019 CLINICAL DATA:  Right arm weakness and numbness. Negative CT evaluation yesterday. EXAM: MRI HEAD WITHOUT CONTRAST MRA HEAD WITHOUT CONTRAST MRA NECK WITHOUT AND WITH CONTRAST TECHNIQUE: Multiplanar, multiecho pulse sequences of the brain and surrounding structures were obtained without and with intravenous contrast. Angiographic images of the Circle of Willis were obtained using MRA technique without intravenous contrast. Angiographic images of the neck were obtained using MRA technique without and with intravenous contrast. Carotid stenosis measurements (when applicable) are obtained utilizing NASCET criteria, using the distal internal carotid diameter as the denominator. CONTRAST:  23mL GADAVIST GADOBUTROL 1 MMOL/ML IV SOLN COMPARISON:  Head CT 10/03/2019 FINDINGS: MRI HEAD FINDINGS Brain: Diffusion imaging shows scattered acute infarctions affecting the left parietal cortex. No large confluent infarction. No evidence of swelling or hemorrhage. Elsewhere, the brainstem and cerebellum are normal. Cerebral hemispheres show minimal changes of small vessel disease affecting the white matter. No mass, hydrocephalus or extra-axial collection. Vascular: Major vessels at the base of the brain show flow. Skull and upper cervical spine: Negative Sinuses/Orbits: Clear/normal Other: None MRA HEAD FINDINGS Both internal carotid arteries are patent through the skull base and siphon regions. There is mild siphon atherosclerotic irregularity but no flow limiting stenosis. Probable 2 mm periophthalmic artery aneurysm on the left. The anterior and middle cerebral vessels are patent without proximal stenosis, aneurysm or vascular malformation. No missing large or medium-sized branch vessels are identified. Both vertebral arteries are widely patent to the basilar. No basilar stenosis. Posterior circulation branch vessels are normal. MRA  NECK FINDINGS Both common carotid arteries are  widely patent to the bifurcation. Both carotid bifurcations appear widely patent without flow limiting stenosis. Smooth atherosclerotic plaque is present in the ICA bulb on the left, but narrowing is no more than 30%. Both vertebral arteries are patent with the left being dominant. Possible narrowing of the vertebral artery origins. Limited detail due to breathing motion. IMPRESSION: Areas of acute infarction affecting cortex in the left parietal region consistent with left MCA branch vessel territory infarction. Underlying white matter is largely spared. No swelling or hemorrhage. Mild chronic small-vessel change elsewhere affecting the cerebral hemispheric white matter. Smooth plaque affecting the ICA bulb on the left with stenosis of 30%. Cannot rule out vertebral artery origin narrowing. Detail is limited because of physiologic chest motion. No intracranial large or medium vessel occlusion identified presently. 2 mm left periophthalmic aneurysm. Electronically Signed   By: Nelson Chimes M.D.   On: 10/04/2019 07:45   MR ANGIO NECK W WO CONTRAST  Result Date: 10/04/2019 CLINICAL DATA:  Right arm weakness and numbness. Negative CT evaluation yesterday. EXAM: MRI HEAD WITHOUT CONTRAST MRA HEAD WITHOUT CONTRAST MRA NECK WITHOUT AND WITH CONTRAST TECHNIQUE: Multiplanar, multiecho pulse sequences of the brain and surrounding structures were obtained without and with intravenous contrast. Angiographic images of the Circle of Willis were obtained using MRA technique without intravenous contrast. Angiographic images of the neck were obtained using MRA technique without and with intravenous contrast. Carotid stenosis measurements (when applicable) are obtained utilizing NASCET criteria, using the distal internal carotid diameter as the denominator. CONTRAST:  38mL GADAVIST GADOBUTROL 1 MMOL/ML IV SOLN COMPARISON:  Head CT 10/03/2019 FINDINGS: MRI HEAD FINDINGS Brain: Diffusion imaging shows scattered acute infarctions  affecting the left parietal cortex. No large confluent infarction. No evidence of swelling or hemorrhage. Elsewhere, the brainstem and cerebellum are normal. Cerebral hemispheres show minimal changes of small vessel disease affecting the white matter. No mass, hydrocephalus or extra-axial collection. Vascular: Major vessels at the base of the brain show flow. Skull and upper cervical spine: Negative Sinuses/Orbits: Clear/normal Other: None MRA HEAD FINDINGS Both internal carotid arteries are patent through the skull base and siphon regions. There is mild siphon atherosclerotic irregularity but no flow limiting stenosis. Probable 2 mm periophthalmic artery aneurysm on the left. The anterior and middle cerebral vessels are patent without proximal stenosis, aneurysm or vascular malformation. No missing large or medium-sized branch vessels are identified. Both vertebral arteries are widely patent to the basilar. No basilar stenosis. Posterior circulation branch vessels are normal. MRA NECK FINDINGS Both common carotid arteries are widely patent to the bifurcation. Both carotid bifurcations appear widely patent without flow limiting stenosis. Smooth atherosclerotic plaque is present in the ICA bulb on the left, but narrowing is no more than 30%. Both vertebral arteries are patent with the left being dominant. Possible narrowing of the vertebral artery origins. Limited detail due to breathing motion. IMPRESSION: Areas of acute infarction affecting cortex in the left parietal region consistent with left MCA branch vessel territory infarction. Underlying white matter is largely spared. No swelling or hemorrhage. Mild chronic small-vessel change elsewhere affecting the cerebral hemispheric white matter. Smooth plaque affecting the ICA bulb on the left with stenosis of 30%. Cannot rule out vertebral artery origin narrowing. Detail is limited because of physiologic chest motion. No intracranial large or medium vessel occlusion  identified presently. 2 mm left periophthalmic aneurysm. Electronically Signed   By: Nelson Chimes M.D.   On: 10/04/2019 07:45   MR  BRAIN WO CONTRAST  Result Date: 10/04/2019 CLINICAL DATA:  Right arm weakness and numbness. Negative CT evaluation yesterday. EXAM: MRI HEAD WITHOUT CONTRAST MRA HEAD WITHOUT CONTRAST MRA NECK WITHOUT AND WITH CONTRAST TECHNIQUE: Multiplanar, multiecho pulse sequences of the brain and surrounding structures were obtained without and with intravenous contrast. Angiographic images of the Circle of Willis were obtained using MRA technique without intravenous contrast. Angiographic images of the neck were obtained using MRA technique without and with intravenous contrast. Carotid stenosis measurements (when applicable) are obtained utilizing NASCET criteria, using the distal internal carotid diameter as the denominator. CONTRAST:  68mL GADAVIST GADOBUTROL 1 MMOL/ML IV SOLN COMPARISON:  Head CT 10/03/2019 FINDINGS: MRI HEAD FINDINGS Brain: Diffusion imaging shows scattered acute infarctions affecting the left parietal cortex. No large confluent infarction. No evidence of swelling or hemorrhage. Elsewhere, the brainstem and cerebellum are normal. Cerebral hemispheres show minimal changes of small vessel disease affecting the white matter. No mass, hydrocephalus or extra-axial collection. Vascular: Major vessels at the base of the brain show flow. Skull and upper cervical spine: Negative Sinuses/Orbits: Clear/normal Other: None MRA HEAD FINDINGS Both internal carotid arteries are patent through the skull base and siphon regions. There is mild siphon atherosclerotic irregularity but no flow limiting stenosis. Probable 2 mm periophthalmic artery aneurysm on the left. The anterior and middle cerebral vessels are patent without proximal stenosis, aneurysm or vascular malformation. No missing large or medium-sized branch vessels are identified. Both vertebral arteries are widely patent to the  basilar. No basilar stenosis. Posterior circulation branch vessels are normal. MRA NECK FINDINGS Both common carotid arteries are widely patent to the bifurcation. Both carotid bifurcations appear widely patent without flow limiting stenosis. Smooth atherosclerotic plaque is present in the ICA bulb on the left, but narrowing is no more than 30%. Both vertebral arteries are patent with the left being dominant. Possible narrowing of the vertebral artery origins. Limited detail due to breathing motion. IMPRESSION: Areas of acute infarction affecting cortex in the left parietal region consistent with left MCA branch vessel territory infarction. Underlying white matter is largely spared. No swelling or hemorrhage. Mild chronic small-vessel change elsewhere affecting the cerebral hemispheric white matter. Smooth plaque affecting the ICA bulb on the left with stenosis of 30%. Cannot rule out vertebral artery origin narrowing. Detail is limited because of physiologic chest motion. No intracranial large or medium vessel occlusion identified presently. 2 mm left periophthalmic aneurysm. Electronically Signed   By: Nelson Chimes M.D.   On: 10/04/2019 07:45   CT HEAD CODE STROKE WO CONTRAST  Result Date: 10/03/2019 CLINICAL DATA:  Code stroke. Possible stroke, neuro deficit, acute, stroke suspected. Additional history obtained from [place] arm weakness, numbness and neglect, last known normal 10/02/2019 at 23:00 EXAM: CT HEAD WITHOUT CONTRAST TECHNIQUE: Contiguous axial images were obtained from the base of the skull through the vertex without intravenous contrast. COMPARISON:  No pertinent prior studies available for comparison. FINDINGS: Brain: There is mild generalized parenchymal atrophy. Mild ill-defined hypoattenuation within the cerebral white matter is nonspecific, but consistent with chronic small vessel ischemic disease. There is no acute intracranial hemorrhage. No demarcated cortical  infarct is identified. No extra-axial fluid collection. No evidence of intracranial mass. No midline shift. Vascular: No hyperdense vessel.  Atherosclerotic calcifications Skull: Normal. Negative for fracture or focal lesion. Sinuses/Orbits: Visualized orbits show no acute finding. No significant paranasal sinus disease or mastoid effusion at the imaged levels. ASPECTS [facility] Stroke Program Early CT Score) - Ganglionic level infarction (  caudate, lentiform nuclei, internal capsule, insula, M1-M3 cortex): 7 - Supraganglionic infarction (M4-M6 cortex): 3 Total score (0-10 with 10 being normal): 10 These results were communicated to Dr. Rory Percy At 10:34 pmon 6/5/2021by text page via the [facility] messaging system. IMPRESSION: 1. No CT evidence of acute intracranial abnormality.  ASPECTS is 10. 2. Mild generalized parenchymal atrophy and chronic small vessel ischemic disease Electronically Signed   By: Kellie Simmering DO   On: 10/03/2019 22:34    Procedures Procedures (including critical care time)  Medications Ordered in ED Medications  aspirin tablet 325 mg (325 mg Oral Not Given 10/04/19 0433)  atorvastatin (LIPITOR) tablet 80 mg (80 mg Oral Given 10/04/19 0811)  insulin aspart (novoLOG) injection 0-9 Units (1 Units Subcutaneous Given 10/04/19 0812)  insulin aspart (novoLOG) injection 0-5 Units (has no administration in time range)  sodium chloride flush (NS) 0.9 % injection 3 mL (3 mLs Intravenous Given 10/03/19 2230)  dextrose 25% IV injection (0 g Intravenous Stopped 10/04/19 0612)  dextrose 5 % solution ( Intravenous Stopped 10/04/19 0611)  gadobutrol (GADAVIST) 1 MMOL/ML injection 10 mL (10 mLs Intravenous Contrast Given 10/04/19 0732)    ED Course  I have reviewed the triage vital signs and the nursing notes.  Pertinent labs & imaging results that were available during my care of the patient were reviewed by me and considered in my medical decision making (see chart for details).  Clinical Course as of Oct 03 1201  Sat Oct 03, 2019  2308 Spoke with Dr. Josephine Cables who will evaluate for admission.    [CS]    Clinical Course User Index [CS] Truddie Hidden, MD   MDM Rules/Calculators/A&P                      Patient with stroke-like symptoms outside of the window for tPA. Stroke MD at bedside on patient arrival recommends admission to hospitalist for further evaluation.  Final Clinical Impression(s) / ED Diagnoses Final diagnoses:  Cerebrovascular accident (CVA), unspecified mechanism [facility])    Rx / DC Orders ED Discharge Orders    None       Truddie Hidden, MD 10/04/19 1203

## 2019-10-04 ENCOUNTER — Other Ambulatory Visit: Payer: Self-pay

## 2019-10-04 ENCOUNTER — Observation Stay (HOSPITAL_COMMUNITY): Payer: Medicare PPO

## 2019-10-04 DIAGNOSIS — N183 Chronic kidney disease, stage 3 unspecified: Secondary | ICD-10-CM | POA: Diagnosis not present

## 2019-10-04 DIAGNOSIS — G8191 Hemiplegia, unspecified affecting right dominant side: Secondary | ICD-10-CM | POA: Diagnosis present

## 2019-10-04 DIAGNOSIS — D638 Anemia in other chronic diseases classified elsewhere: Secondary | ICD-10-CM | POA: Diagnosis present

## 2019-10-04 DIAGNOSIS — I472 Ventricular tachycardia: Secondary | ICD-10-CM | POA: Diagnosis present

## 2019-10-04 DIAGNOSIS — I130 Hypertensive heart and chronic kidney disease with heart failure and stage 1 through stage 4 chronic kidney disease, or unspecified chronic kidney disease: Secondary | ICD-10-CM | POA: Diagnosis present

## 2019-10-04 DIAGNOSIS — N1831 Chronic kidney disease, stage 3a: Secondary | ICD-10-CM | POA: Diagnosis present

## 2019-10-04 DIAGNOSIS — K5909 Other constipation: Secondary | ICD-10-CM | POA: Diagnosis present

## 2019-10-04 DIAGNOSIS — I639 Cerebral infarction, unspecified: Secondary | ICD-10-CM | POA: Diagnosis present

## 2019-10-04 DIAGNOSIS — I6389 Other cerebral infarction: Secondary | ICD-10-CM | POA: Diagnosis not present

## 2019-10-04 DIAGNOSIS — Z20822 Contact with and (suspected) exposure to covid-19: Secondary | ICD-10-CM | POA: Diagnosis present

## 2019-10-04 DIAGNOSIS — Z794 Long term (current) use of insulin: Secondary | ICD-10-CM | POA: Diagnosis not present

## 2019-10-04 DIAGNOSIS — I429 Cardiomyopathy, unspecified: Secondary | ICD-10-CM | POA: Diagnosis present

## 2019-10-04 DIAGNOSIS — M109 Gout, unspecified: Secondary | ICD-10-CM | POA: Diagnosis present

## 2019-10-04 DIAGNOSIS — E785 Hyperlipidemia, unspecified: Secondary | ICD-10-CM

## 2019-10-04 DIAGNOSIS — Z87891 Personal history of nicotine dependence: Secondary | ICD-10-CM | POA: Diagnosis not present

## 2019-10-04 DIAGNOSIS — I69351 Hemiplegia and hemiparesis following cerebral infarction affecting right dominant side: Secondary | ICD-10-CM | POA: Diagnosis not present

## 2019-10-04 DIAGNOSIS — Z79899 Other long term (current) drug therapy: Secondary | ICD-10-CM | POA: Diagnosis not present

## 2019-10-04 DIAGNOSIS — Z6841 Body Mass Index (BMI) 40.0 and over, adult: Secondary | ICD-10-CM | POA: Diagnosis not present

## 2019-10-04 DIAGNOSIS — E1122 Type 2 diabetes mellitus with diabetic chronic kidney disease: Secondary | ICD-10-CM | POA: Diagnosis present

## 2019-10-04 DIAGNOSIS — E160 Drug-induced hypoglycemia without coma: Secondary | ICD-10-CM

## 2019-10-04 DIAGNOSIS — Z8546 Personal history of malignant neoplasm of prostate: Secondary | ICD-10-CM | POA: Diagnosis not present

## 2019-10-04 DIAGNOSIS — I63512 Cerebral infarction due to unspecified occlusion or stenosis of left middle cerebral artery: Secondary | ICD-10-CM | POA: Diagnosis present

## 2019-10-04 DIAGNOSIS — E1142 Type 2 diabetes mellitus with diabetic polyneuropathy: Secondary | ICD-10-CM | POA: Diagnosis present

## 2019-10-04 DIAGNOSIS — R471 Dysarthria and anarthria: Secondary | ICD-10-CM | POA: Diagnosis present

## 2019-10-04 DIAGNOSIS — I10 Essential (primary) hypertension: Secondary | ICD-10-CM | POA: Diagnosis not present

## 2019-10-04 DIAGNOSIS — E860 Dehydration: Secondary | ICD-10-CM

## 2019-10-04 DIAGNOSIS — I5042 Chronic combined systolic (congestive) and diastolic (congestive) heart failure: Secondary | ICD-10-CM | POA: Diagnosis present

## 2019-10-04 DIAGNOSIS — R414 Neurologic neglect syndrome: Secondary | ICD-10-CM | POA: Diagnosis present

## 2019-10-04 DIAGNOSIS — R4781 Slurred speech: Secondary | ICD-10-CM | POA: Diagnosis present

## 2019-10-04 LAB — COMPREHENSIVE METABOLIC PANEL
ALT: 12 U/L (ref 0–44)
AST: 16 U/L (ref 15–41)
Albumin: 3.2 g/dL — ABNORMAL LOW (ref 3.5–5.0)
Alkaline Phosphatase: 68 U/L (ref 38–126)
Anion gap: 7 (ref 5–15)
BUN: 30 mg/dL — ABNORMAL HIGH (ref 8–23)
CO2: 24 mmol/L (ref 22–32)
Calcium: 9.1 mg/dL (ref 8.9–10.3)
Chloride: 109 mmol/L (ref 98–111)
Creatinine, Ser: 1.59 mg/dL — ABNORMAL HIGH (ref 0.61–1.24)
GFR calc Af Amer: 47 mL/min — ABNORMAL LOW (ref >60)
GFR calc non Af Amer: 40 mL/min — ABNORMAL LOW (ref >60)
Glucose, Bld: 251 mg/dL — ABNORMAL HIGH (ref 70–99)
Potassium: 4 mmol/L (ref 3.5–5.1)
Sodium: 140 mmol/L (ref 135–145)
Total Bilirubin: 0.8 mg/dL (ref 0.3–1.2)
Total Protein: 6.1 g/dL — ABNORMAL LOW (ref 6.5–8.1)

## 2019-10-04 LAB — GLUCOSE, CAPILLARY
Glucose-Capillary: 101 mg/dL — ABNORMAL HIGH (ref 70–99)
Glucose-Capillary: 239 mg/dL — ABNORMAL HIGH (ref 70–99)
Glucose-Capillary: 145 mg/dL — ABNORMAL HIGH (ref 70–99)
Glucose-Capillary: 137 mg/dL — ABNORMAL HIGH (ref 70–99)
Glucose-Capillary: 167 mg/dL — ABNORMAL HIGH (ref 70–99)
Glucose-Capillary: 119 mg/dL — ABNORMAL HIGH (ref 70–99)

## 2019-10-04 LAB — CBC
HCT: 35 % — ABNORMAL LOW (ref 39.0–52.0)
Hemoglobin: 10.9 g/dL — ABNORMAL LOW (ref 13.0–17.0)
MCH: 30.4 pg (ref 26.0–34.0)
MCHC: 31.1 g/dL (ref 30.0–36.0)
MCV: 97.5 fL (ref 80.0–100.0)
Platelets: 167 10*3/uL (ref 150–400)
RBC: 3.59 MIL/uL — ABNORMAL LOW (ref 4.22–5.81)
RDW: 13.2 % (ref 11.5–15.5)
WBC: 5.4 10*3/uL (ref 4.0–10.5)
nRBC: 0 % (ref 0.0–0.2)

## 2019-10-04 LAB — LIPID PANEL
Cholesterol: 164 mg/dL (ref 0–200)
HDL: 50 mg/dL (ref >40)
LDL Cholesterol: 105 mg/dL — ABNORMAL HIGH (ref 0–99)
Total CHOL/HDL Ratio: 3.3 RATIO
Triglycerides: 45 mg/dL (ref <150)
VLDL: 9 mg/dL (ref 0–40)

## 2019-10-04 LAB — PHOSPHORUS: Phosphorus: 3.2 mg/dL (ref 2.5–4.6)

## 2019-10-04 LAB — APTT: aPTT: 29 seconds (ref 24–36)

## 2019-10-04 LAB — CBG MONITORING, ED: Glucose-Capillary: 79 mg/dL (ref 70–99)

## 2019-10-04 LAB — HEMOGLOBIN A1C
Hgb A1c MFr Bld: 5.6 % (ref 4.8–5.6)
Mean Plasma Glucose: 114.02 mg/dL

## 2019-10-04 LAB — SARS CORONAVIRUS 2 BY RT PCR (HOSPITAL ORDER, PERFORMED IN ~~LOC~~ HOSPITAL LAB): SARS Coronavirus 2: NEGATIVE

## 2019-10-04 LAB — PROTIME-INR
INR: 1.1 (ref 0.8–1.2)
Prothrombin Time: 13.4 seconds (ref 11.4–15.2)

## 2019-10-04 LAB — MAGNESIUM: Magnesium: 1.9 mg/dL (ref 1.7–2.4)

## 2019-10-04 IMAGING — MR MR HEAD W/O CM
12 of 14 series · 43 of 48 positions shown · IV contrast (gadavist)
Comparison: Head CT [DATE]

CLINICAL DATA: Right arm weakness and numbness. Negative CT
evaluation yesterday.

EXAM:
MRI HEAD WITHOUT CONTRAST
MRA HEAD WITHOUT CONTRAST
MRA NECK WITHOUT AND WITH CONTRAST
TECHNIQUE: Multiplanar, multiecho pulse sequences of the brain and surrounding
structures were obtained without and with intravenous contrast.
Angiographic images of the Circle of Willis were obtained using MRA
technique without intravenous contrast. Angiographic images of the
neck were obtained using MRA technique without and with intravenous
contrast. Carotid stenosis measurements (when applicable) are
obtained utilizing NASCET criteria, using the distal internal
carotid diameter as the denominator.
CONTRAST:  10mL GADAVIST GADOBUTROL 1 MMOL/ML IV SOLN

[Series 5: T1 · sagittal · 5.0mm · 0.75mm/px · 3 of 25 slices shown]
[im 1/25]
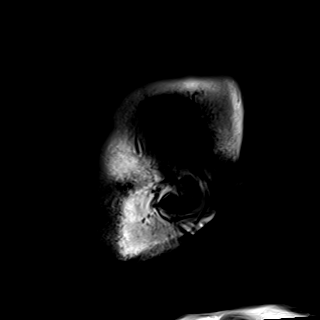
[im 13/25]
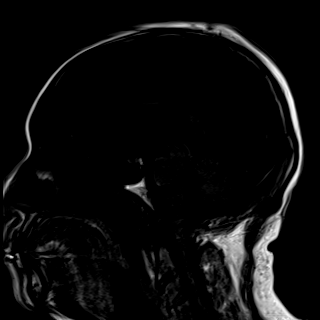
[im 25/25]
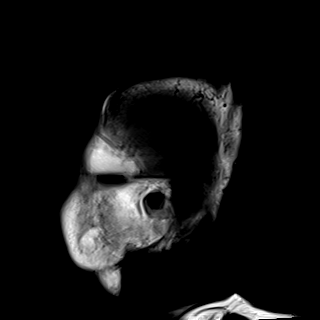

[Series 6: DWI · axial · 3.0mm · 0.88mm/px · z∈[-62,+76]mm · 7 of 96 slices shown (1 of 4)]
[im 1/96]
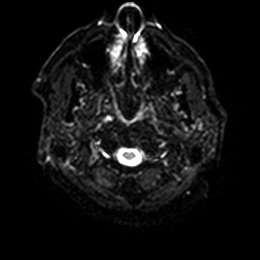
[im 16/96]
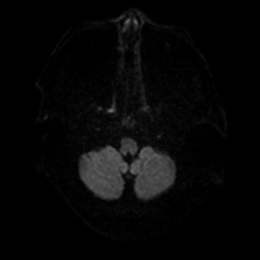
[im 32/96]
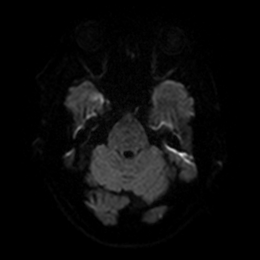
[im 48/96]
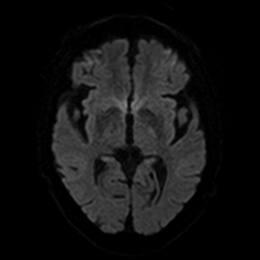
[im 64/96]
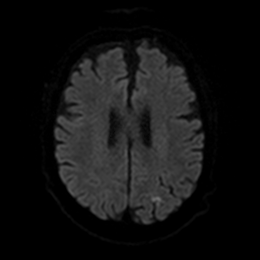
[im 80/96]
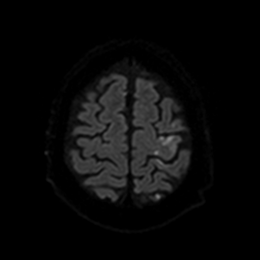
[im 96/96]
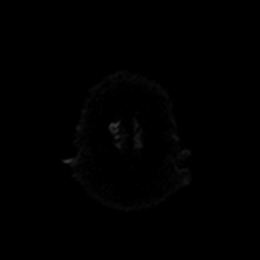

[Series 7: DWI · axial · 3.0mm · 0.88mm/px · z∈[-62,+76]mm · 4 of 48 slices shown (2 of 4)]
[im 1/48]
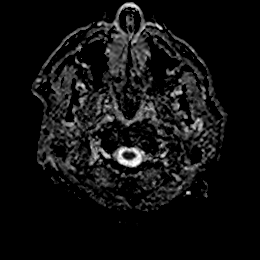
[im 16/48]
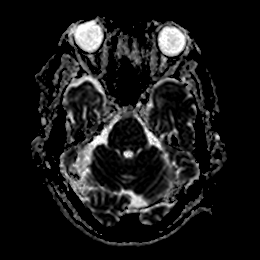
[im 32/48]
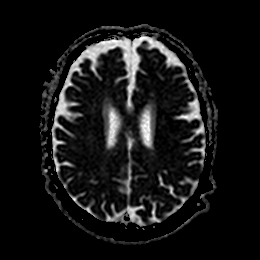
[im 48/48]
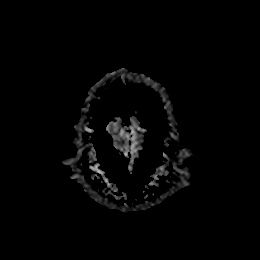

[Series 8: DWI · coronal · 4.0mm · 0.88mm/px · 5 of 66 slices shown (3 of 4)]
[im 1/66]
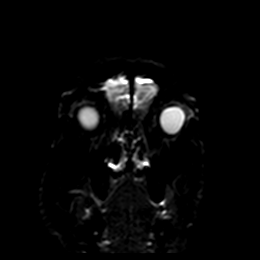
[im 17/66]
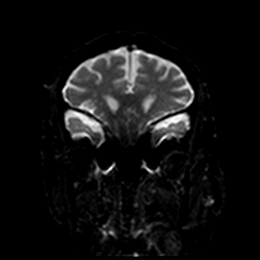
[im 33/66]
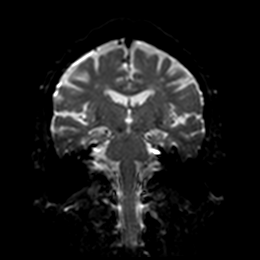
[im 49/66]
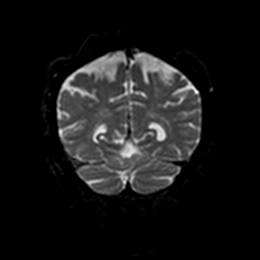
[im 66/66]
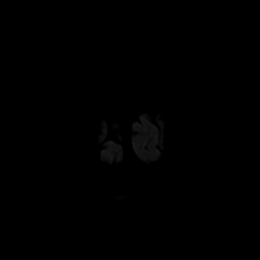

[Series 9: DWI · coronal · 4.0mm · 0.88mm/px · 2 of 33 slices shown (4 of 4)]
[im 1/33]
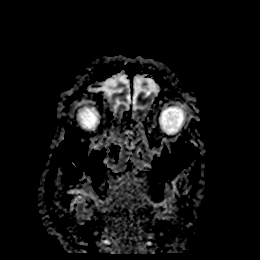
[im 33/33]
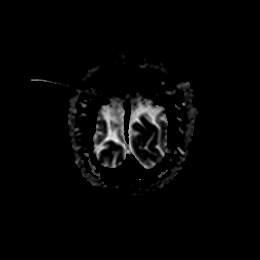

[Series 11: FLAIR · axial · 5.0mm · 0.90mm/px · z∈[-68,+85]mm · 2 of 27 slices shown]
[im 1/27]
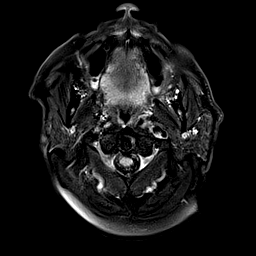
[im 27/27]
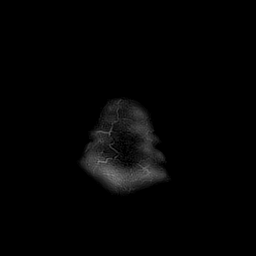

[Series 12: T2 · axial · 5.0mm · 0.72mm/px · z∈[-68,+85]mm · 2 of 27 slices shown (1 of 2)]
[im 1/27]
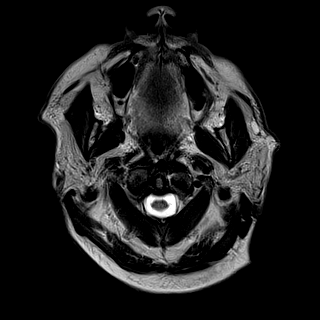
[im 27/27]
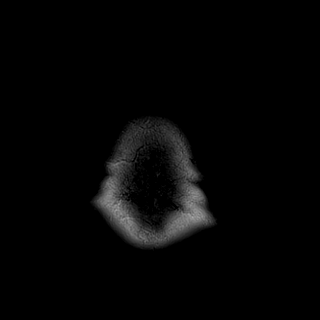

[Series 13: mag_images · axial · 3.0mm · 0.90mm/px · z∈[-73,+89]mm · 4 of 56 slices shown]
[im 1/56]
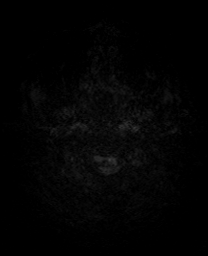
[im 19/56]
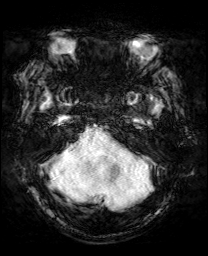
[im 37/56]
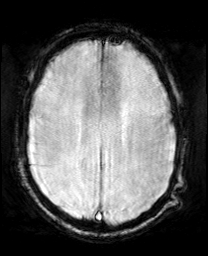
[im 56/56]
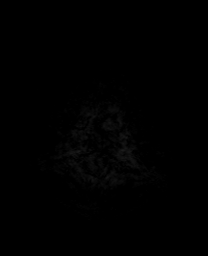

[Series 14: pha_images · axial · 3.0mm · 0.90mm/px · z∈[-70,+89]mm · 4 of 55 slices shown]
[im 1/55]
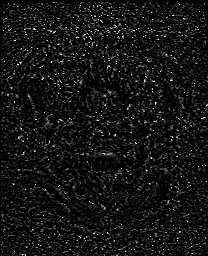
[im 19/55]
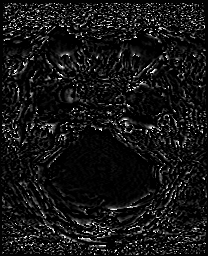
[im 37/55]
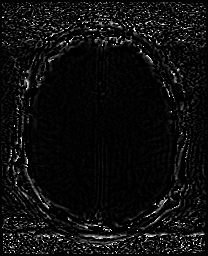
[im 55/55]
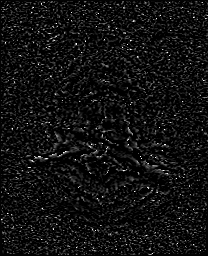

[Series 15: swi_images · axial · 3.0mm · 0.90mm/px · z∈[-73,+89]mm · 4 of 56 slices shown]
[im 1/56]
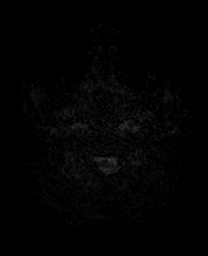
[im 19/56]
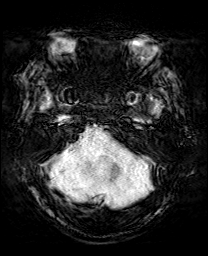
[im 37/56]
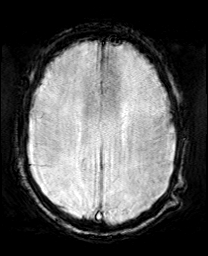
[im 56/56]
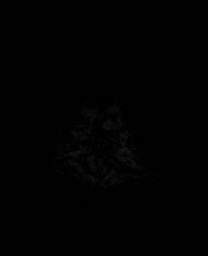

[Series 16: mip_images(sw) · axial · 24.0mm · 0.90mm/px · z∈[-63,+78]mm · 4 of 49 slices shown]
[im 1/49]
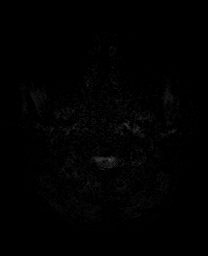
[im 17/49]
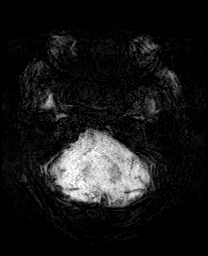
[im 33/49]
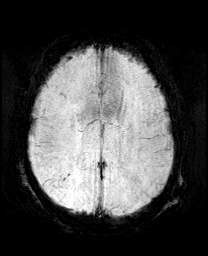
[im 49/49]
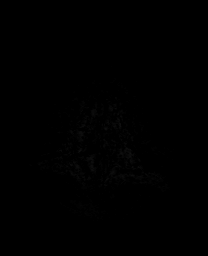

[Series 18: T2 · coronal · 5.0mm · 0.34mm/px · 2 of 29 slices shown (2 of 2)]
[im 1/29]
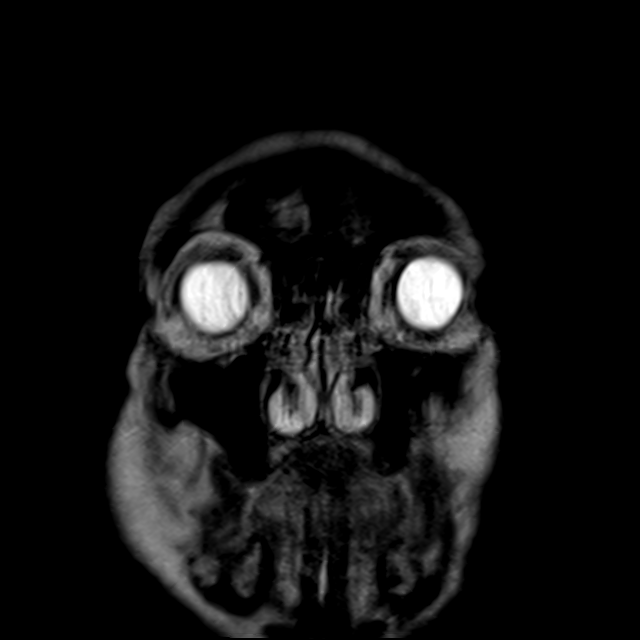
[im 29/29]
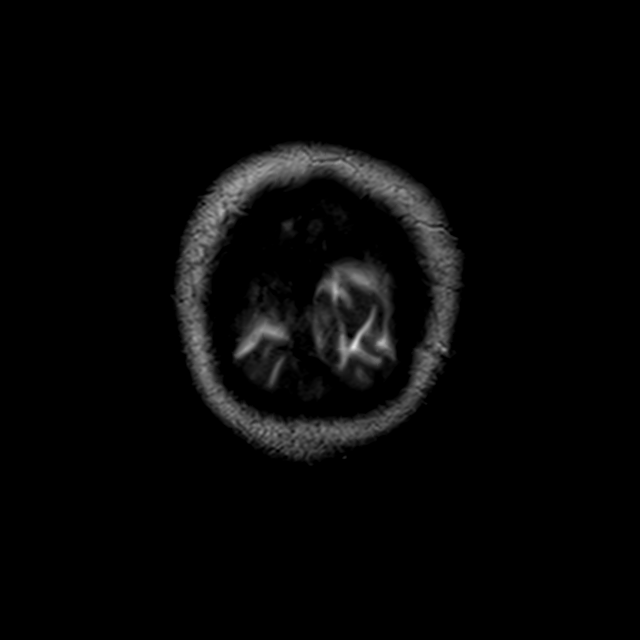

[43 of 48 positions shown; findings below may reference images not displayed]

FINDINGS: MRI HEAD FINDINGS

Brain: Diffusion imaging shows scattered acute infarctions affecting
the left parietal cortex. No large confluent infarction. No evidence
of swelling or hemorrhage. Elsewhere, the brainstem and cerebellum
are normal. Cerebral hemispheres show minimal changes of small
vessel disease affecting the white matter. No mass, hydrocephalus or
extra-axial collection.

Vascular: Major vessels at the base of the brain show flow.

Skull and upper cervical spine: Negative

Sinuses/Orbits: Clear/normal

Other: None

MRA HEAD FINDINGS

Both internal carotid arteries are patent through the skull base and
siphon regions. There is mild siphon atherosclerotic irregularity
but no flow limiting stenosis. Probable 2 mm periophthalmic artery
aneurysm on the left. The anterior and middle cerebral vessels are
patent without proximal stenosis, aneurysm or vascular malformation.
No missing large or medium-sized branch vessels are identified.

Both vertebral arteries are widely patent to the basilar. No basilar
stenosis. Posterior circulation branch vessels are normal.

MRA NECK FINDINGS

Both common carotid arteries are widely patent to the bifurcation.
Both carotid bifurcations appear widely patent without flow limiting
stenosis. Smooth atherosclerotic plaque is present in the ICA bulb
on the left, but narrowing is no more than 30%.

Both vertebral arteries are patent with the left being dominant.
Possible narrowing of the vertebral artery origins. Limited detail
due to breathing motion.
IMPRESSION: Areas of acute infarction affecting cortex in the left parietal
region consistent with left MCA branch vessel territory infarction.
Underlying white matter is largely spared. No swelling or
hemorrhage.

Mild chronic small-vessel change elsewhere affecting the cerebral
hemispheric white matter.

Smooth plaque affecting the ICA bulb on the left with stenosis of
30%.

Cannot rule out vertebral artery origin narrowing. Detail is limited
because of physiologic chest motion.

No intracranial large or medium vessel occlusion identified
presently.

2 mm left periophthalmic aneurysm.

## 2019-10-04 IMAGING — MR MR MRA HEAD W/O CM
1 series · 18 of 48 positions shown · IV contrast (gadavist)
Comparison: Head CT [DATE]

CLINICAL DATA: Right arm weakness and numbness. Negative CT
evaluation yesterday.

EXAM:
MRI HEAD WITHOUT CONTRAST
MRA HEAD WITHOUT CONTRAST
MRA NECK WITHOUT AND WITH CONTRAST
TECHNIQUE: Multiplanar, multiecho pulse sequences of the brain and surrounding
structures were obtained without and with intravenous contrast.
Angiographic images of the Circle of Willis were obtained using MRA
technique without intravenous contrast. Angiographic images of the
neck were obtained using MRA technique without and with intravenous
contrast. Carotid stenosis measurements (when applicable) are
obtained utilizing NASCET criteria, using the distal internal
carotid diameter as the denominator.
CONTRAST:  10mL GADAVIST GADOBUTROL 1 MMOL/ML IV SOLN

[Series 1: 3d cow · axial · 0.5mm · 0.41mm/px · z∈[-67,+20]mm · 18 of 188 slices shown]
[im 1/188]
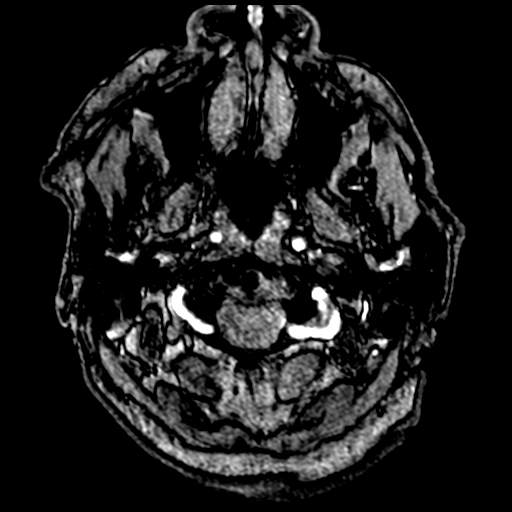
[im 4/188]
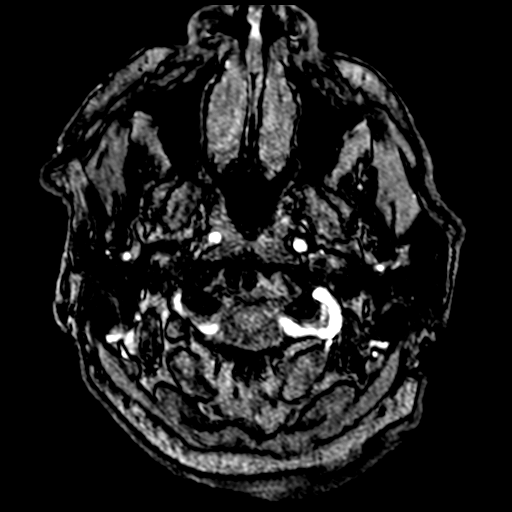
[im 8/188]
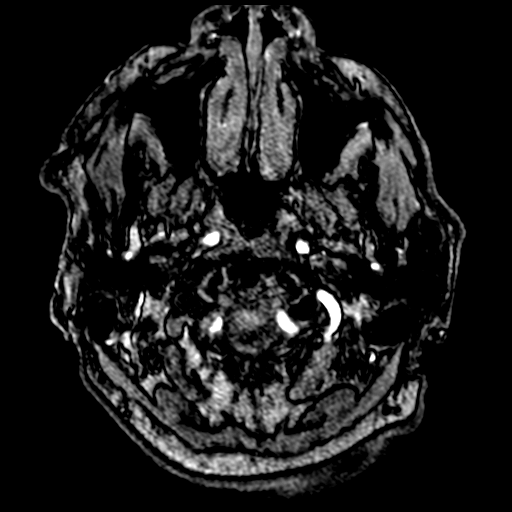
[im 12/188]
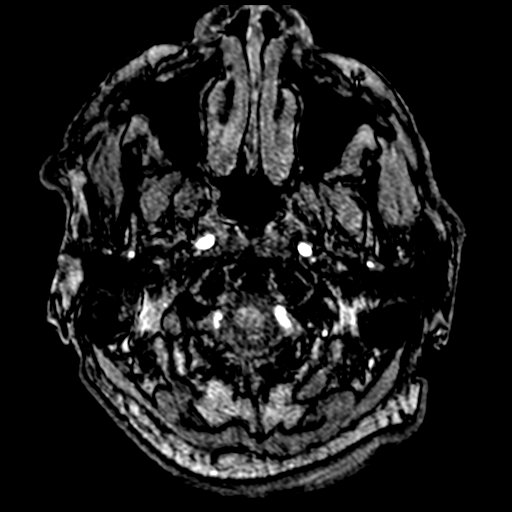
[im 16/188]
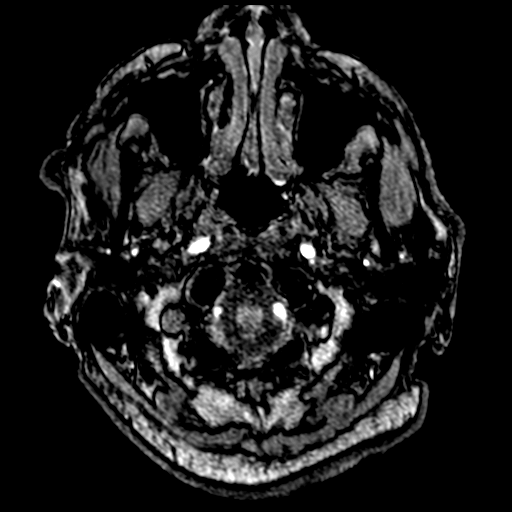
[im 20/188]
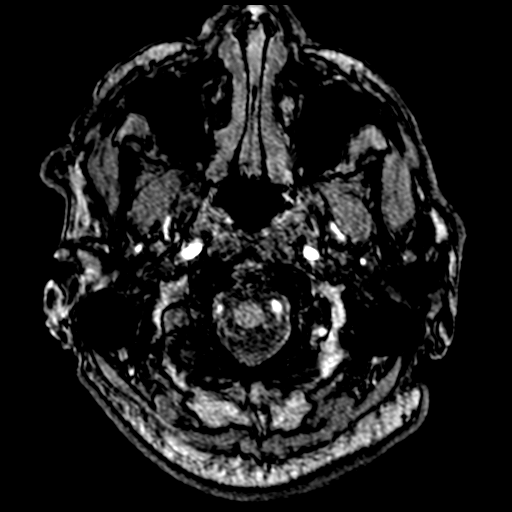
[im 24/188]
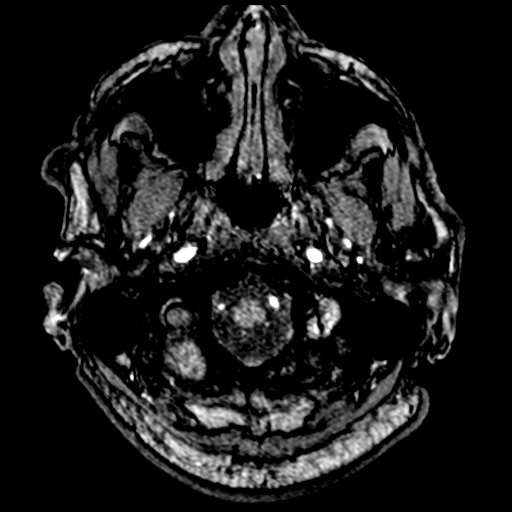
[im 28/188]
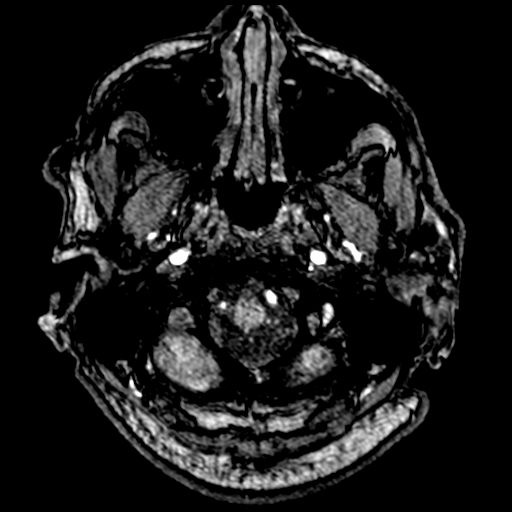
[im 32/188]
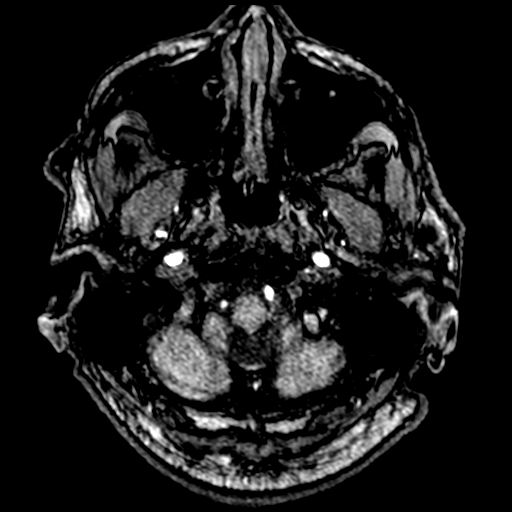
[im 36/188]
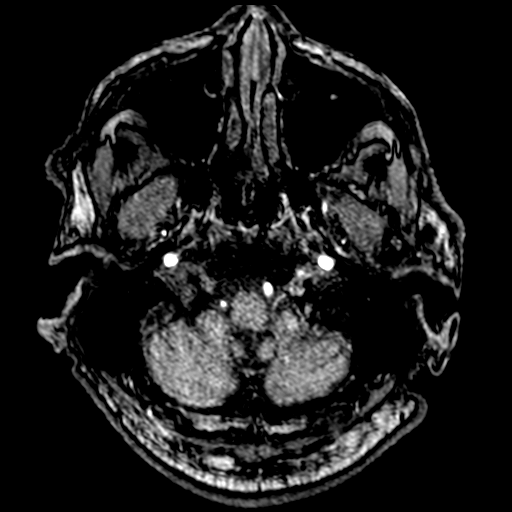
[im 60/188]
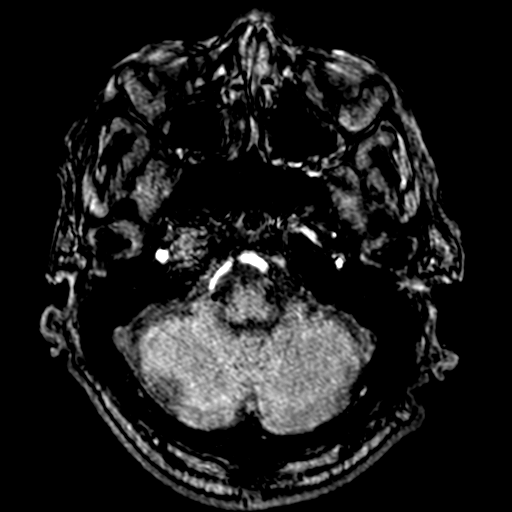
[im 84/188]
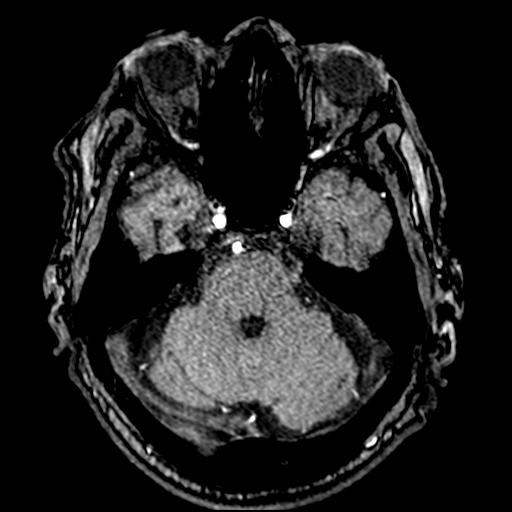
[im 96/188]
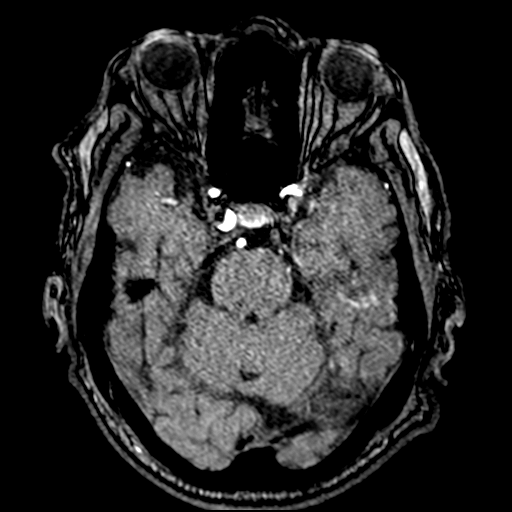
[im 108/188]
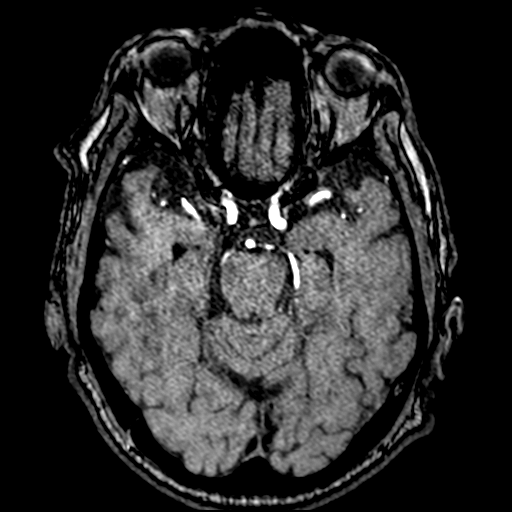
[im 132/188]
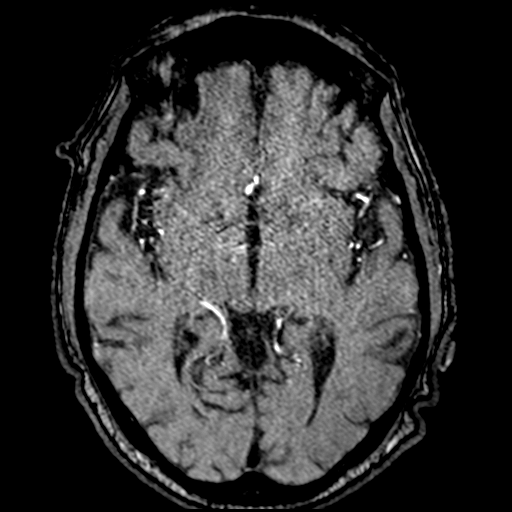
[im 156/188]
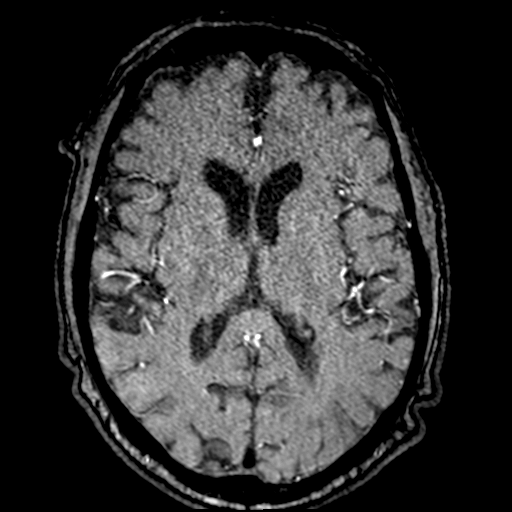
[im 160/188]
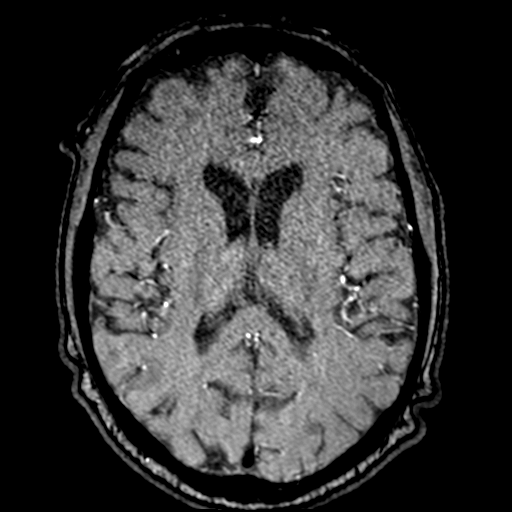
[im 180/188]
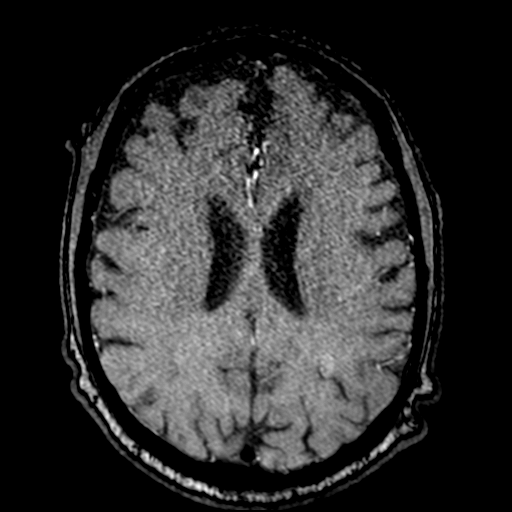

[18 of 48 positions shown; findings below may reference images not displayed]

FINDINGS: MRI HEAD FINDINGS

Brain: Diffusion imaging shows scattered acute infarctions affecting
the left parietal cortex. No large confluent infarction. No evidence
of swelling or hemorrhage. Elsewhere, the brainstem and cerebellum
are normal. Cerebral hemispheres show minimal changes of small
vessel disease affecting the white matter. No mass, hydrocephalus or
extra-axial collection.

Vascular: Major vessels at the base of the brain show flow.

Skull and upper cervical spine: Negative

Sinuses/Orbits: Clear/normal

Other: None

MRA HEAD FINDINGS

Both internal carotid arteries are patent through the skull base and
siphon regions. There is mild siphon atherosclerotic irregularity
but no flow limiting stenosis. Probable 2 mm periophthalmic artery
aneurysm on the left. The anterior and middle cerebral vessels are
patent without proximal stenosis, aneurysm or vascular malformation.
No missing large or medium-sized branch vessels are identified.

Both vertebral arteries are widely patent to the basilar. No basilar
stenosis. Posterior circulation branch vessels are normal.

MRA NECK FINDINGS

Both common carotid arteries are widely patent to the bifurcation.
Both carotid bifurcations appear widely patent without flow limiting
stenosis. Smooth atherosclerotic plaque is present in the ICA bulb
on the left, but narrowing is no more than 30%.

Both vertebral arteries are patent with the left being dominant.
Possible narrowing of the vertebral artery origins. Limited detail
due to breathing motion.
IMPRESSION: Areas of acute infarction affecting cortex in the left parietal
region consistent with left MCA branch vessel territory infarction.
Underlying white matter is largely spared. No swelling or
hemorrhage.

Mild chronic small-vessel change elsewhere affecting the cerebral
hemispheric white matter.

Smooth plaque affecting the ICA bulb on the left with stenosis of
30%.

Cannot rule out vertebral artery origin narrowing. Detail is limited
because of physiologic chest motion.

No intracranial large or medium vessel occlusion identified
presently.

2 mm left periophthalmic aneurysm.

## 2019-10-04 IMAGING — MR MR MRA NECK WO/W CM
5 of 8 series · 41 of 48 positions shown · IV contrast (gadavist)
Comparison: Head CT [DATE]

CLINICAL DATA: Right arm weakness and numbness. Negative CT
evaluation yesterday.

EXAM:
MRI HEAD WITHOUT CONTRAST
MRA HEAD WITHOUT CONTRAST
MRA NECK WITHOUT AND WITH CONTRAST
TECHNIQUE: Multiplanar, multiecho pulse sequences of the brain and surrounding
structures were obtained without and with intravenous contrast.
Angiographic images of the Circle of Willis were obtained using MRA
technique without intravenous contrast. Angiographic images of the
neck were obtained using MRA technique without and with intravenous
contrast. Carotid stenosis measurements (when applicable) are
obtained utilizing NASCET criteria, using the distal internal
carotid diameter as the denominator.
CONTRAST:  10mL GADAVIST GADOBUTROL 1 MMOL/ML IV SOLN

[Series 1: tof_fl3d_tra_iso · axial · 0.6mm · 0.52mm/px · z∈[-204,-126]mm · 9 of 133 slices shown]
[im 1/133]
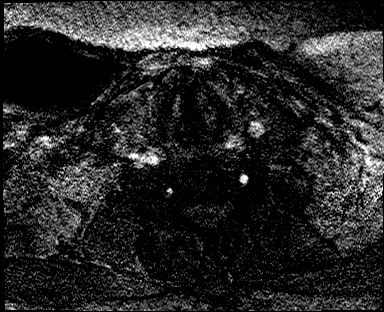
[im 23/133]
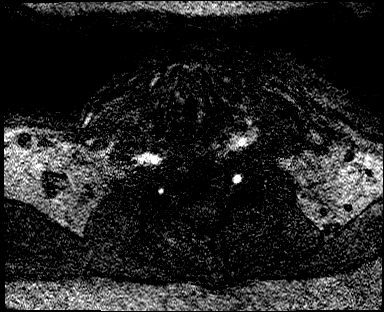
[im 45/133]
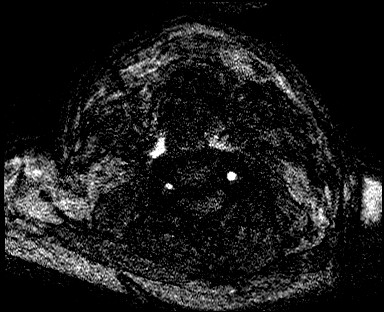
[im 56/133]
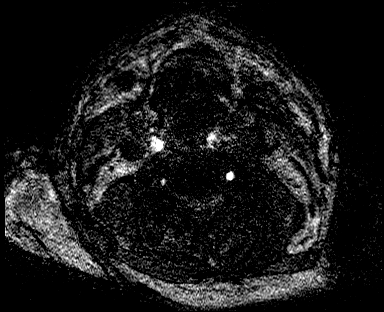
[im 67/133]
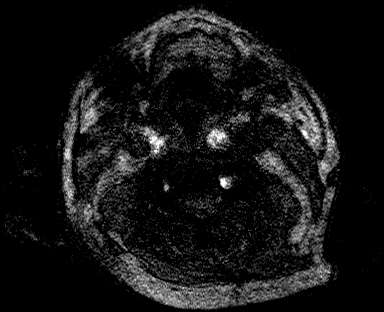
[im 78/133]
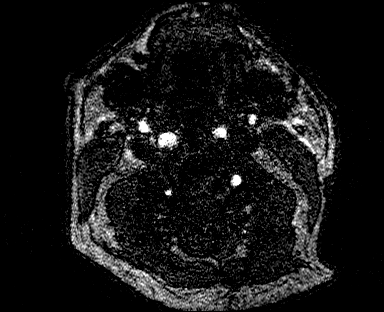
[im 89/133]
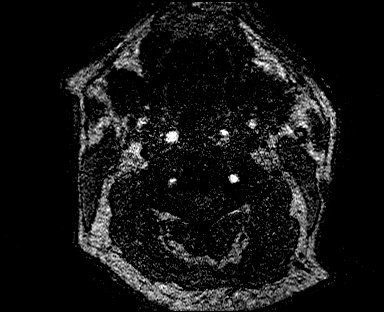
[im 111/133]
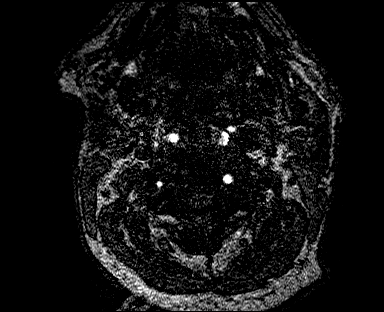
[im 133/133]
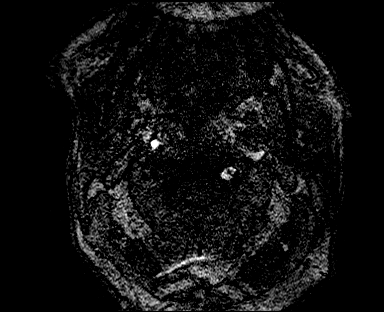

[Series 7: angio_fl3d_cor_pre_ttc=3.0s · coronal · 0.9mm · 0.85mm/px · 8 of 80 slices shown]
[im 1/80]
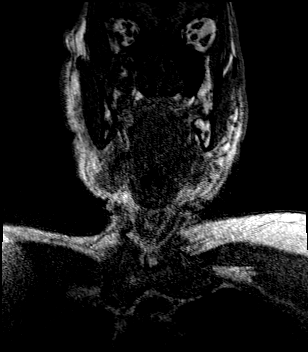
[im 12/80]
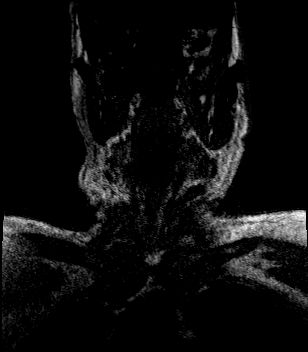
[im 23/80]
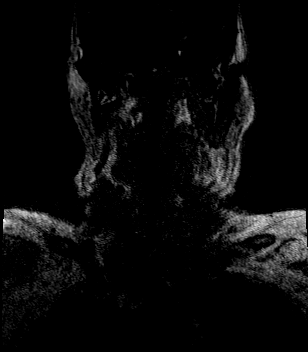
[im 34/80]
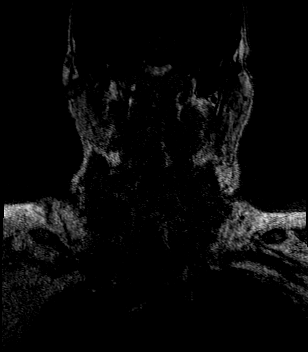
[im 46/80]
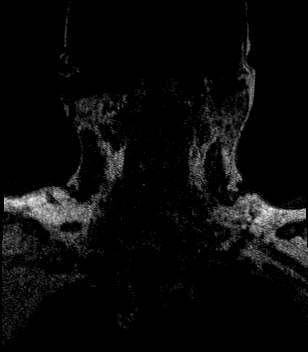
[im 57/80]
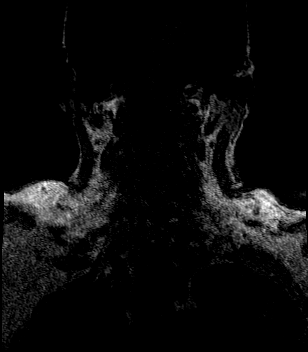
[im 68/80]
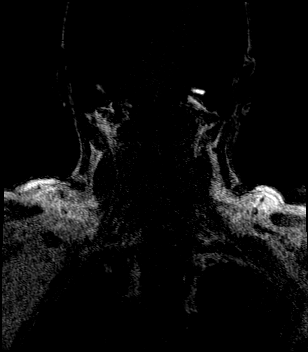
[im 80/80]
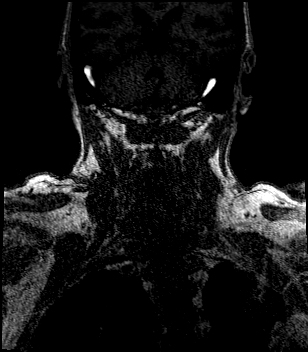

[Series 9: angio_fl3d_cor_post_ttc=3.0s · coronal · 0.9mm · 0.85mm/px · 8 of 80 slices shown]
[im 1/80]
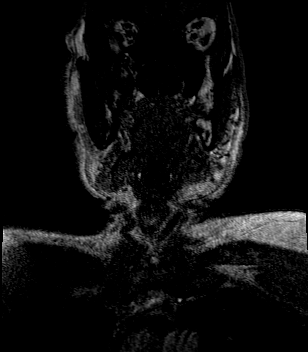
[im 12/80]
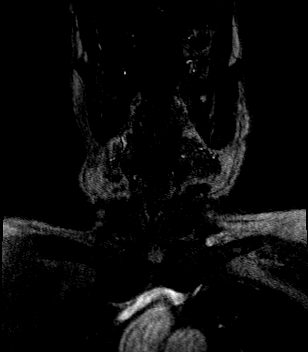
[im 23/80]
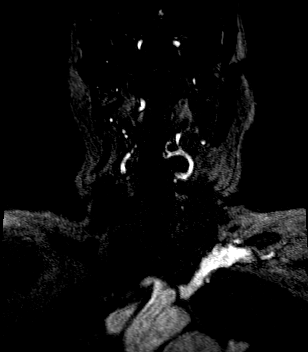
[im 34/80]
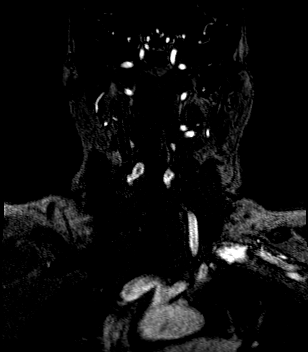
[im 46/80]
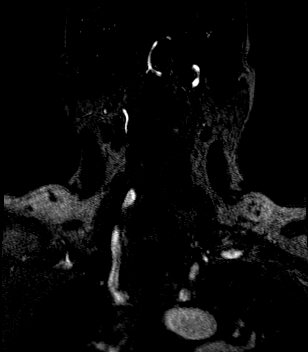
[im 57/80]
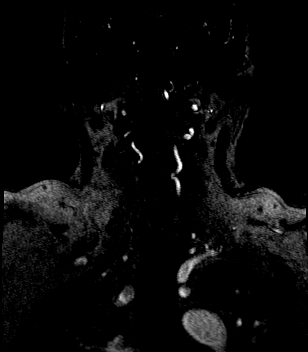
[im 68/80]
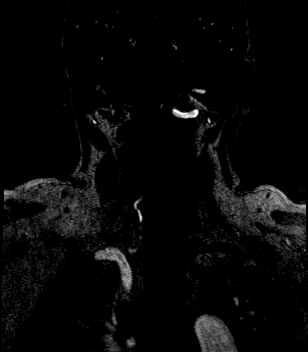
[im 80/80]
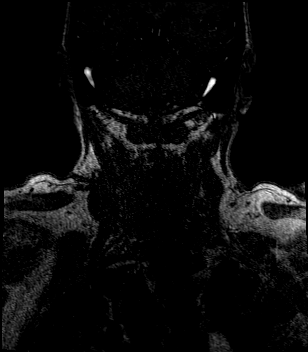

[Series 10: angio_fl3d_cor_post_ttc=3.0s_moco-adv · coronal · 0.9mm · 0.85mm/px · 8 of 80 slices shown]
[im 1/80]
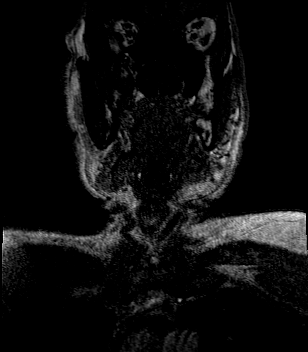
[im 12/80]
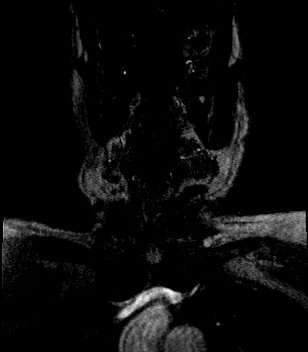
[im 23/80]
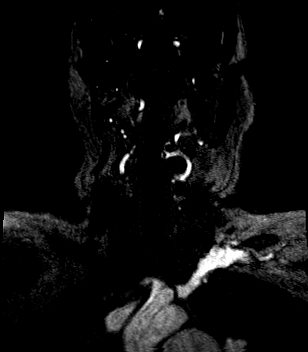
[im 34/80]
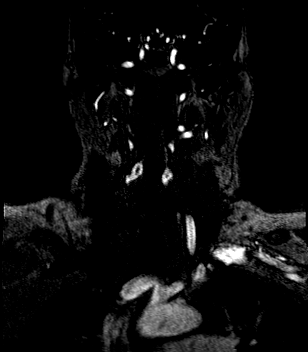
[im 46/80]
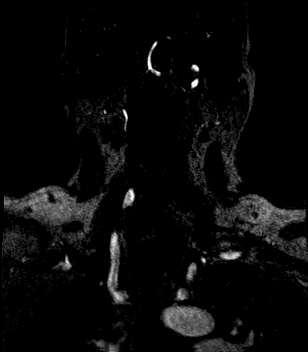
[im 57/80]
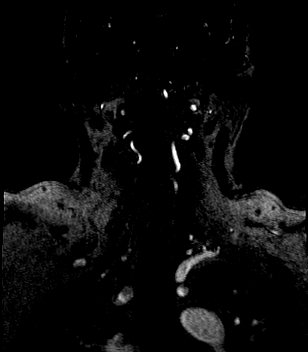
[im 68/80]
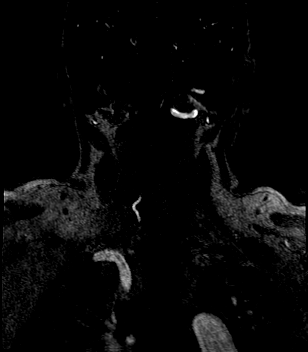
[im 80/80]
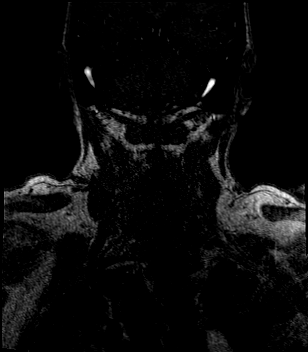

[Series 11: angio_fl3d_cor_post_ttc=3.0s_moco-adv_sub · coronal · 0.9mm · 0.85mm/px · 8 of 80 slices shown]
[im 1/80]
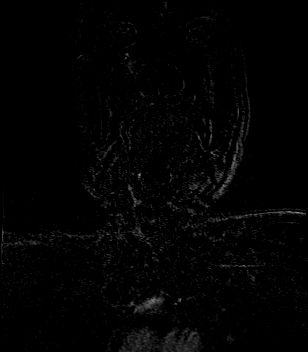
[im 12/80]
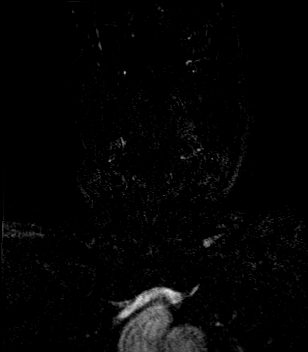
[im 23/80]
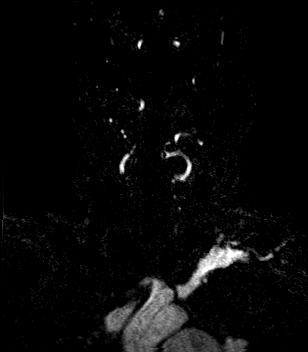
[im 34/80]
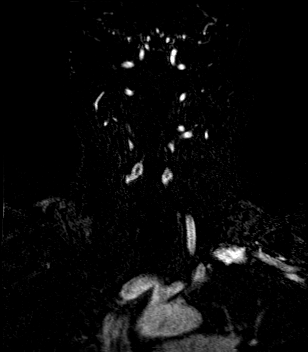
[im 46/80]
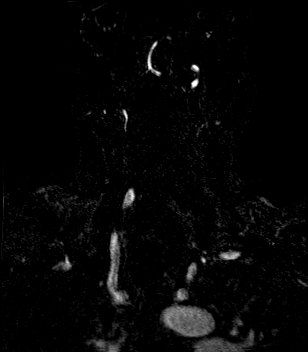
[im 57/80]
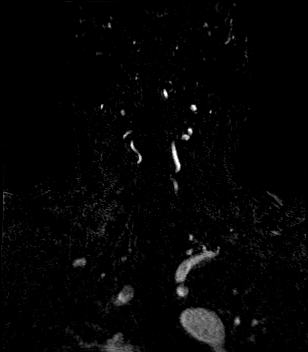
[im 68/80]
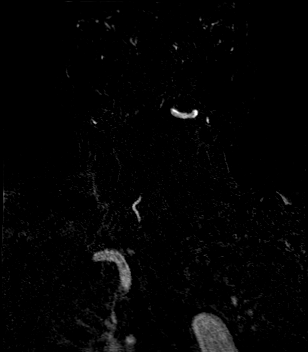
[im 80/80]
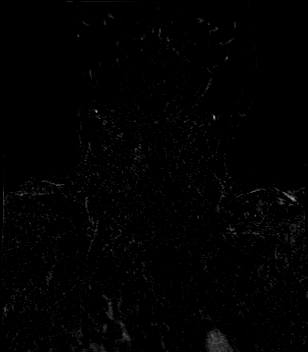

[41 of 48 positions shown; findings below may reference images not displayed]

FINDINGS: MRI HEAD FINDINGS

Brain: Diffusion imaging shows scattered acute infarctions affecting
the left parietal cortex. No large confluent infarction. No evidence
of swelling or hemorrhage. Elsewhere, the brainstem and cerebellum
are normal. Cerebral hemispheres show minimal changes of small
vessel disease affecting the white matter. No mass, hydrocephalus or
extra-axial collection.

Vascular: Major vessels at the base of the brain show flow.

Skull and upper cervical spine: Negative

Sinuses/Orbits: Clear/normal

Other: None

MRA HEAD FINDINGS

Both internal carotid arteries are patent through the skull base and
siphon regions. There is mild siphon atherosclerotic irregularity
but no flow limiting stenosis. Probable 2 mm periophthalmic artery
aneurysm on the left. The anterior and middle cerebral vessels are
patent without proximal stenosis, aneurysm or vascular malformation.
No missing large or medium-sized branch vessels are identified.

Both vertebral arteries are widely patent to the basilar. No basilar
stenosis. Posterior circulation branch vessels are normal.

MRA NECK FINDINGS

Both common carotid arteries are widely patent to the bifurcation.
Both carotid bifurcations appear widely patent without flow limiting
stenosis. Smooth atherosclerotic plaque is present in the ICA bulb
on the left, but narrowing is no more than 30%.

Both vertebral arteries are patent with the left being dominant.
Possible narrowing of the vertebral artery origins. Limited detail
due to breathing motion.
IMPRESSION: Areas of acute infarction affecting cortex in the left parietal
region consistent with left MCA branch vessel territory infarction.
Underlying white matter is largely spared. No swelling or
hemorrhage.

Mild chronic small-vessel change elsewhere affecting the cerebral
hemispheric white matter.

Smooth plaque affecting the ICA bulb on the left with stenosis of
30%.

Cannot rule out vertebral artery origin narrowing. Detail is limited
because of physiologic chest motion.

No intracranial large or medium vessel occlusion identified
presently.

2 mm left periophthalmic aneurysm.

## 2019-10-04 MED ORDER — GADOBUTROL 1 MMOL/ML IV SOLN
10.0000 mL | Freq: Once | INTRAVENOUS | Status: AC | PRN
  Administered 2019-10-04: 10 mL via INTRAVENOUS

## 2019-10-04 MED ORDER — DEXTROSE 5 % AND 0.9 % NACL IV BOLUS: 1000.0000 mL | Freq: Once | INTRAVENOUS | Status: DC

## 2019-10-04 MED ORDER — DEXTROSE-NACL 5-0.9 % IV SOLN: INTRAVENOUS | Status: AC

## 2019-10-04 MED ORDER — HEPARIN SODIUM (PORCINE) 5000 UNIT/ML IJ SOLN: 5000.0000 [IU] | Freq: Three times a day (TID) | INTRAMUSCULAR | Status: DC

## 2019-10-04 MED ORDER — INSULIN ASPART 100 UNIT/ML ~~LOC~~ SOLN
0.0000 [IU] | Freq: Three times a day (TID) | SUBCUTANEOUS | Status: DC
  Administered 2019-10-04: 2 [IU] via SUBCUTANEOUS
  Administered 2019-10-04 (×2): 1 [IU] via SUBCUTANEOUS
  Administered 2019-10-05 (×2): 2 [IU] via SUBCUTANEOUS
  Administered 2019-10-06: 1 [IU] via SUBCUTANEOUS
  Administered 2019-10-06 – 2019-10-07 (×2): 2 [IU] via SUBCUTANEOUS

## 2019-10-04 MED ORDER — ATORVASTATIN CALCIUM 80 MG PO TABS
80.0000 mg | ORAL_TABLET | Freq: Every day | ORAL | Status: DC
  Administered 2019-10-04 – 2019-10-07 (×4): 80 mg via ORAL
  Filled 2019-10-04 (×4): qty 1

## 2019-10-04 MED ORDER — INSULIN ASPART 100 UNIT/ML ~~LOC~~ SOLN
0.0000 [IU] | Freq: Every day | SUBCUTANEOUS | Status: DC
  Administered 2019-10-06: 2 [IU] via SUBCUTANEOUS

## 2019-10-04 MED ORDER — HEPARIN SODIUM (PORCINE) 5000 UNIT/ML IJ SOLN
5000.0000 [IU] | Freq: Three times a day (TID) | INTRAMUSCULAR | Status: DC
  Administered 2019-10-04 – 2019-10-07 (×10): 5000 [IU] via SUBCUTANEOUS
  Filled 2019-10-04 (×9): qty 1

## 2019-10-04 MED ORDER — ASPIRIN 325 MG PO TABS: 325.0000 mg | ORAL_TABLET | Freq: Once | ORAL | Status: DC

## 2019-10-04 NOTE — Evaluation (Signed)
Physical Therapy Evaluation Patient Details Name: Nathan Proctor. MRN: [id number] DOB: [birth year] Today's Date: 10/04/2019   History of Present Illness  Nathan Proctor. is a 79 y.o. male who has had a past medical history of diabetes, prostate cancer, CKD 3, diabetic neuropathy, morbid obesity, hypertension, hyperlipidemia, presenting with sudden onset of right arm weakness and slurred speech; MRI reported: "Areas of acute infarction affecting cortex in the left parietal region consistent with left MCA branch vessel territory infarction."    Clinical Impression   Pt admitted with above diagnosis. Pt comes from home where he lives with his wife (who works) in a single level home with 3 steps to enter; Independent at baseline, drives, is a Furniture conservator/restorer; Presents to PT with gait and balance dysfunction, incr fall risk, noted R sided dyscoordination and weakness (more notable upper extremity than LE); Notable for decr activity tolerance, in particular, standing tolerance, with incr reported 'wooziness' with standing acts;  Pt currently with functional limitations due to the deficits listed below (see PT Problem List). Pt will benefit from skilled PT to increase their independence and safety with mobility to allow discharge to the venue listed below.       Follow Up Recommendations CIR;Supervision/Assistance - 24 hour    Equipment Recommendations  Rolling walker with 5" wheels    Recommendations for Other Services OT consult(as ordered)     Precautions / Restrictions Precautions Precautions: Fall      Mobility  Bed Mobility Overal bed mobility: Needs Assistance Bed Mobility: Supine to Sit     Supine to sit: Min guard(without physical contact)     General bed mobility comments: incr effort and use of bedrails  Transfers Overall transfer level: Needs assistance Equipment used: 1 person hand held assist Transfers: Sit to/from Stand Sit to Stand: Min  assist         General transfer comment: Min assist to steady  Ambulation/Gait Ambulation/Gait assistance: Min assist;Mod assist Gait Distance (Feet): 10 Feet Assistive device: 1 person hand held assist Gait Pattern/deviations: Wide base of support     General Gait Details: Tending to reach out for UE support; wide base of support/step width; notable for reported "wooziness" with more time in upright standing which limited amb distance  Stairs            Wheelchair Mobility    Modified Rankin (Stroke Patients Only) Modified Rankin (Stroke Patients Only) Pre-Morbid Rankin Score: No symptoms Modified Rankin: Moderate disability     Balance Overall balance assessment: Needs assistance   Sitting balance-Leahy Scale: Fair       Standing balance-Leahy Scale: Poor Standing balance comment: consistently reaching out for UE support with standing activities                             Pertinent Vitals/Pain Pain Assessment: No/denies pain    Home Living Family/patient expects to be discharged to:: Private residence Living Arrangements: Spouse/significant other Available Help at Discharge: Family;Available PRN/intermittently(Wife is a Professor) Type of Home: House Home Access: Stairs to enter Entrance Stairs-Rails: None Technical brewer of Steps: 3 Home Layout: One level Home Equipment: Cane - single point      Prior Function Level of Independence: Needs assistance   Gait / Transfers Assistance Needed: Uses a cane occasionally; "not as often as maybe I should"  ADL's / Homemaking Assistance Needed: Could use more info -- at one point he said he isn't  able to put his socks on, but did not elaborate when asked how he manages   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, sports, and manages many endeavors     Hand Dominance   Dominant Hand: Right    Extremity/Trunk Assessment   Upper Extremity Assessment Upper Extremity Assessment: Defer to OT evaluation     Lower Extremity Assessment Lower Extremity Assessment: Generalized weakness;RLE deficits/detail RLE Deficits / Details: R hip flexor 3+/5; quad 4/5; hamstring tested seated 4-/5 RLE Coordination: decreased gross motor    Cervical / Trunk Assessment Cervical / Trunk Assessment: Other exceptions Cervical / Trunk Exceptions: incr body habitus  Communication   Communication: No difficulties  Cognition Arousal/Alertness: Awake/alert Behavior During Therapy: WFL for tasks assessed/performed(NOtable for mood change when discussed inpt rehab) Overall Cognitive Status: Impaired/Different from baseline Area of Impairment: Safety/judgement                         Safety/Judgement: Decreased awareness of safety;Decreased awareness of deficits            General Comments General comments (skin integrity, edema, etc.): Reported "wooziness" with more time in standing activities; did not have the opportunity to obtain a BP at that time; sensation of wooziniess coincided with a marked decr in his conversation/talkativeness    Exercises     Assessment/Plan    PT Assessment Patient needs continued PT services  PT Problem List Decreased strength;Decreased range of motion;Decreased activity tolerance;Decreased balance;Decreased mobility;Decreased coordination;Decreased cognition;Decreased knowledge of use of DME;Decreased safety awareness;Decreased knowledge of precautions;Obesity       PT Treatment Interventions DME instruction;Gait training;Stair training;Functional mobility training;Therapeutic activities;Therapeutic exercise;Balance training;Neuromuscular re-education;Cognitive remediation;Patient/family education    PT Goals (Current goals can be found in the Care Plan section)  Acute Rehab PT Goals Patient Stated Goal: Hopes to go home soon PT Goal Formulation: With patient Time For Goal Achievement: 10/18/19 Potential to Achieve Goals: Good    Frequency Min 4X/week    Barriers to discharge        Co-evaluation               AM-PAC PT "6 Clicks" Mobility  Outcome Measure Help needed turning from your back to your side while in a flat bed without using bedrails?: A Little Help needed moving from lying on your back to sitting on the side of a flat bed without using bedrails?: A Little Help needed moving to and from a bed to a chair (including a wheelchair)?: A Little Help needed standing up from a chair using your arms (e.g., wheelchair or bedside chair)?: A Little Help needed to walk in hospital room?: A Lot Help needed climbing 3-5 steps with a railing? : A Lot 6 Click Score: 16    End of Session Equipment Utilized During Treatment: Gait belt Activity Tolerance: Other (comment)(limited by wooziness with standing activity) Patient left: in chair;with call bell/phone within reach;with chair alarm set Nurse Communication: Mobility status;Other (comment)(Dc recs) PT Visit Diagnosis: Unsteadiness on feet (R26.81);Other abnormalities of gait and mobility (R26.89);Hemiplegia and hemiparesis Hemiplegia - Right/Left: Right Hemiplegia - dominant/non-dominant: Dominant Hemiplegia - caused by: Cerebral infarction    Time: 0802-0838 PT Time Calculation (min) (ACUTE ONLY): 36 min   Charges:   PT Evaluation $PT Eval Moderate Complexity: 1 Mod PT Treatments $Gait Training: 8-22 mins        Roney Marion, PT  Acute Rehabilitation Services Pager [phone] Office [phone]   [place] 10/04/2019, 10:05 AM

## 2019-10-04 NOTE — Progress Notes (Signed)
STROKE TEAM PROGRESS NOTE   HISTORY OF PRESENT ILLNESS (per record) Nathan Proctor. is a 79 y.o. male who has had a past medical history of diabetes, prostate cancer, CKD 3, diabetic neuropathy, morbid obesity, hypertension, hyperlipidemia, presented to the emergency room as an acute code stroke due to initial last known well of 5 AM today but upon further history taking, he went to bed, and around 11:59 PM on 10/02/2019 and woke up at 5 AM today with some right-sided weakness.  He did not make much of it but when the weakness did not improve, he called EMS.  On initial EMS evaluation, he did exhibit some right-sided neglect for which they activated a LVO positive code stroke.  At the ER bridge-blood glucose 57.  Given half ampule of D50 and taken for CT head. My examination details as documented below.  I do not appreciate visual or sensory neglect -clinical exam not significant for large vessel occlusion. Outside the window for TPA. Denies any chest pain shortness of breath cough fever nausea vomiting.  Denies any Covid contacts. Noted that on arrival to the ER bridge-blood sugar 57. LKW: 11:59 PM on 10/02/2019 tpa given?: no, outside the window Premorbid modified Rankin scale (mRS): 3   INTERVAL HISTORY His  daughter is at the bedside.  He presented with right sided weakness yesterday upon awakening from sleep which has persisted.  Denies any prior history of strokes.  MRI scan shows an embolic patchy left MCA branch infarct.  MRA of the neck shows 30% proximal left ICA stenosis.  MRA of the brain showed no significant large vessel stenosis.  LDL cholesterol is 105 mg percent and hemoglobin A1c is 5.6.  Echocardiogram   is pending.    OBJECTIVE Vitals:   10/04/19 0100 10/04/19 0115 10/04/19 0210 10/04/19 0750  BP: (!) 144/78 132/68 (!) 127/52 130/63  Pulse: 73 64 62 74  Resp: 19 15 20 19   Temp:   97.6 F (36.4 C) 97.9 F (36.6 C)  TempSrc:   Oral   SpO2: 98% 96% 99% 98%  Weight:    (!) 141.6 kg   Height:   5\' 10"  (1.778 m)     CBC:  Recent Labs  Lab 10/03/19 2217 10/03/19 2217 10/03/19 2224 10/04/19 0531  WBC 6.8  --   --  5.4  NEUTROABS 4.7  --   --   --   HGB 11.2*   < > 11.2* 10.9*  HCT 36.6*   < > 33.0* 35.0*  MCV 101.4*  --   --  97.5  PLT 180  --   --  167   < > = values in this interval not displayed.    Basic Metabolic Panel:  Recent Labs  Lab 10/03/19 2217 10/03/19 2217 10/03/19 2224 10/04/19 0531  NA 143   < > 145 140  K 4.3   < > 4.1 4.0  CL 113*   < > 111 109  CO2 23  --   --  24  GLUCOSE 66*   < > 61* 251*  BUN 31*   < > 30* 30*  CREATININE 1.85*   < > 1.90* 1.59*  CALCIUM 9.1  --   --  9.1  MG  --   --   --  1.9  PHOS  --   --   --  3.2   < > = values in this interval not displayed.    Lipid Panel:     Component  Value Date/Time   CHOL 164 10/04/2019 0531   TRIG 45 10/04/2019 0531   HDL 50 10/04/2019 0531   CHOLHDL 3.3 10/04/2019 0531   VLDL 9 10/04/2019 0531   LDLCALC 105 (H) 10/04/2019 0531   HgbA1c:  Lab Results  Component Value Date   HGBA1C 5.6 10/04/2019   Urine Drug Screen: No results found for: LABOPIA, COCAINSCRNUR, LABBENZ, AMPHETMU, THCU, LABBARB  Alcohol Level No results found for: [place]  IMAGING  MR BRAIN WO CONTRAST MR ANGIO HEAD WO CONTRAST MR ANGIO NECK W WO CONTRAST 10/04/2019 IMPRESSION:  Areas of acute infarction affecting cortex in the left parietal region consistent with left MCA branch vessel territory infarction. Underlying white matter is largely spared. No swelling or hemorrhage.  Mild chronic small-vessel change elsewhere affecting the cerebral hemispheric white matter.  Smooth plaque affecting the ICA bulb on the left with stenosis of 30%. Cannot rule out vertebral artery origin narrowing. Detail is limited because of physiologic chest motion.  No intracranial large or medium vessel occlusion identified presently. 2 mm left periophthalmic aneurysm.   CT HEAD CODE STROKE WO  CONTRAST 10/03/2019 IMPRESSION:  1. No CT evidence of acute intracranial abnormality.  ASPECTS is 10.  2. Mild generalized parenchymal atrophy and chronic small vessel ischemic disease   Transthoracic Echocardiogram  00/00/2021 Pending  ECG - SR rate 68 BPM. (See cardiology reading for complete details)    PHYSICAL EXAM Blood pressure 130/63, pulse 74, temperature 97.9 F (36.6 C), resp. rate 19, height 5\' 10"  (1.778 m), weight (!) 141.6 kg, SpO2 98 %. Obese elderly African-American male not in distress. . Afebrile. Head is nontraumatic. Neck is supple without bruit.    Cardiac exam no murmur or gallop. Lungs are clear to auscultation. Distal pulses are well felt. Neurological Exam : Awake alert oriented to time place and person.  Speech is clear with only mild dysarthria.  Extraocular movements are full range without nystagmus.  Face is symmetric without weakness.  Tongue midline.  Motor system exam shows mild right upper extremity drift with weakness of right grip and intrinsic hand muscles.  Orbits left over right upper extremity.  Lower extremity strength testing seems symmetric.  Sensation is intact.  Deep tendon reflexes are symmetric.  Plantars downgoing.  Gait not tested.  NIH stroke scale 2 premorbid modified Rankin scale 1.    ASSESSMENT/PLAN [name]. is a 79 y.o. male with history of diabetes, prostate cancer, CKD 3, diabetic neuropathy, morbid obesity, anemia, hypertension, hyperlipidemia, presented with right-sided weakness and neglect. Blood glucose was 57 per EMTs.  He did not receive IV t-PA due to late presentation (>4.5 hours from time of onset).  Stroke: left MCA infarct - embolic - source unknown  Resultant mild right upper extremity weakness  Code Stroke CT Head - No CT evidence of acute intracranial abnormality.  ASPECTS is 10. Mild generalized parenchymal atrophy and chronic small vessel ischemic disease   CT head - not ordered  MRI head -  Areas of acute infarction affecting cortex in the left parietal region consistent with left MCA branch vessel territory infarction. Underlying white matter is largely spared. No swelling or hemorrhage. Mild chronic small-vessel change elsewhere affecting the cerebral hemispheric white matter.   MRA H&N - Smooth plaque affecting the ICA bulb on the left with stenosis of 30%. Cannot rule out vertebral artery origin narrowing. Detail is limited because of physiologic chest motion. No intracranial large or medium vessel occlusion identified presently. 2 mm left  periophthalmic aneurysm.   CTA H&N - not ordered  CT Perfusion - not ordered  Carotid Doppler - MRA neck performed - carotid dopplers not indicated.  2D Echo - pending  Lacey Jensen Virus 2 - negative  LDL - 105  HgbA1c - 5.6  UDS - not ordered  VTE prophylaxis - SCDs Diet  Diet Order            Diet heart healthy/carb modified Room service appropriate? Yes; Fluid consistency: Thin  Diet effective now              No antithrombotic prior to admission, now on aspirin 325 mg daily  Patient counseled to be compliant with his antithrombotic medications  Ongoing aggressive stroke risk factor management  Therapy recommendations:  CIR recommended  Disposition:  Pending  Hypertension  Home BP meds: Norvasc ; Coreg  Current BP meds: none   Stable . Permissive hypertension (OK if < 220/120) but gradually normalize in 5-7 days . Long-term BP goal normotensive  Hyperlipidemia  Home Lipid lowering medication: Lipitor 40 mg daiy  LDL 105, goal < 70  Current lipid lowering medication: Lipitor 80 mg daily  Continue statin at discharge  Diabetes  Home diabetic meds: insulin  Current diabetic meds: insulin  HgbA1c 5.6, goal < 7.0 Recent Labs    10/04/19 0240 10/04/19 0449 10/04/19 0752  GLUCAP 101* 239* 145*    Other Stroke Risk Factors  Advanced age  Former cigarette smoker - quit  ETOH use, advised  to drink no more than 1 alcoholic beverage per day.  Obesity, Body mass index is 44.79 kg/m., recommend weight loss, diet and exercise as appropriate   Family hx stroke - not on file  Other Active Problems  Code status - Full code  2 mm left periophthalmic aneurysm.   Anemia of chronic disease - Hb - 10.9  CKD stage 3 creatinine - 1.85->1.90->1.59    Hospital day # 0 He presented with embolic left MCA branch infarct etiology to be determined.  Continue aspirin and Plavix for 3 weeks followed by aspirin alone and ongoing stroke work-up and aggressive risk factor modification.  Patient may also consider possible participation in the BMS enzymatic stroke prevention trial if interested and will be given information to review and decide.  Discussed with Dr. Florene Glen and patient's daughter and wife whom I spoke to over the phone and answered questions.  Greater than 50% time during this 35-minute visit was spent on counseling and coordination of care about his embolic stroke and answering questions. Antony Contras, MD Medical Director [facility] Pager: [phone] 10/04/2019 3:30 PM To contact Stroke Continuity provider, please refer to [email or web address] After hours, contact General Neurology

## 2019-10-04 NOTE — Progress Notes (Signed)
Inpatient Rehab Admissions Coordinator Note:   Per PT/OT recommendations, pt was screened for CIR candidacy by Gayland Curry, MS, CCC-SLP.  At this time we are recommending an Inpatient Rehab consult.  AC will place order per protocol.  Please contact me with questions.    Gayland Curry, MS, CCC-SLP Admissions Coordinator [phone] 10/04/19 1:52 PM

## 2019-10-04 NOTE — H&P (Signed)
History and Physical  Nathan Proctor. UKG:[id number] DOB: 12-19-1940 DOA: 10/03/2019  Referring physician: Calvert Cantor MD PCP: Nathan Ebbs, MD  Patient coming from: Home Chief Complaint: Right-sided weakness  HPI: Nathan Proctor. is a 79 y.o. male with medical history significant for type 2 diabetes mellitus, CKD 3, morbid obesity, diabetic neuropathy, hypertension, hyperlipidemia who presents to the emergency department due to right-sided weakness noted when he woke up this morning around 5 AM.  Patient was last well known to be at baseline when he went to bed at 11:59 PM on 10/02/2019, on waking up this morning around 5 AM, he noted right arm weakness, but he did not think much of it because he thought it will self resolve shortly, unfortunately, the weakness did not improve, so he activated EMS and on arrival of EMS, patient was noted to exhibit some right-sided neglect and it was also hypoglycemic with blood glucose of 57.   1/2 ample of D50 was given in route to the ED.  He denies headache, chest pain, shortness of breath, fever, chills, nausea, vomiting or any sick contact.  ED Course:  In the emergency department, he was hemodynamically stable.  Work-up in the ED showed macrocytic anemia, BUN/creatinine 31/1.85 (last creatinine for comparison was 3 years ago with creatinine of 1.42-1.48).  Blood glucose was 66.  SARS coronavirus 2 was negative.  CT head without contrast showed no acute ventricular abnormality.  Neurologist (Dr. Rory Proctor, Nathan Proctor) was consulted and recommended further stroke work-up.  Hospitalist was also admitted for further evaluation monitor.  Review of Systems: Constitutional: Negative for chills and fever.  HENT: Negative for ear pain and sore throat.   Eyes: Negative for pain and visual disturbance.  Respiratory: Negative for cough, chest tightness and shortness of breath.   Cardiovascular: Negative for chest pain and palpitations.  Gastrointestinal:  Negative for abdominal pain and vomiting.  Endocrine: Negative for polyphagia and polyuria.  Genitourinary: Negative for decreased urine volume, dysuria, enuresis Musculoskeletal: Negative for arthralgias and back pain.  Skin: Negative for color change and rash.  Allergic/Immunologic: Negative for immunocompromised state.  Neurological:Negative for tremors, syncope, speech difficulty, weakness, light-headedness and headaches.  Hematological: Does not bruise/bleed easily.       Past Medical History:  Diagnosis Date  . Anemia of chronic disease   . Ankle fracture 02/15/2016  . Cancer [facility])    Prostate  . Chronic constipation   . Chronic kidney disease    stage III  . Diabetes mellitus without complication ([place])    type II   . Diabetic peripheral neuropathy ([place])   . Failure to thrive (0-17)   . Fracture of left lower leg   . Gout   . Hyperlipidemia   . Hypertension   . Morbid obesity ([place])   . Unstable gait    Past Surgical History:  Procedure Laterality Date  . ORIF ANKLE FRACTURE Left 02/29/2016   Procedure: OPEN REDUCTION INTERNAL FIXATION (ORIF) ANKLE FRACTURE;  Surgeon: Nicholes Stairs, MD;  Location: [facility] ORS;  Service: Orthopedics;  Laterality: Left;  . PROSTATE SURGERY      Social History:  reports that he quit smoking about 42 years ago. His smoking use included cigarettes. He has never used smokeless tobacco. He reports current alcohol use. He reports that he does not use drugs.   No Known Allergies  No family history on file.   Prior to Admission medications   Medication Sig Start Date End Date Taking? Authorizing Provider  acetaminophen (  TYLENOL) 500 MG tablet Take 1,000 mg by mouth every 8 (eight) hours as needed for mild pain.    [provider]  allopurinol (ZYLOPRIM) 100 MG tablet Take 100 mg by mouth daily.  01/25/16   [provider]  amLODipine (NORVASC) 10 MG tablet Take 10 mg by mouth daily.     [provider]   atorvastatin (LIPITOR) 40 MG tablet Take 40 mg by mouth daily.     [provider]  carvedilol (COREG) 25 MG tablet Take 25 mg by mouth daily.     [provider]  docusate sodium (COLACE) 100 MG capsule Take 100 mg by mouth 2 (two) times daily.    [provider]  gabapentin (NEURONTIN) 300 MG capsule Take 300 mg by mouth 3 (three) times daily.     [provider]  HYDROcodone-acetaminophen (NORCO/VICODIN) 5-325 MG tablet Take 1 tablet by mouth every 8 (eight) hours as needed for moderate pain.    [provider]  insulin NPH-regular Human (70-30) 100 UNIT/ML injection Inject 30 Units into the skin daily.    [provider]  lubiprostone (AMITIZA) 24 MCG capsule Take 24 mcg by mouth daily with breakfast.     [provider]  tiZANidine (ZANAFLEX) 4 MG tablet Take 4 mg by mouth 2 (two) times daily as needed for muscle spasms. 08/13/19   [provider]  Vitamin D, Ergocalciferol, (DRISDOL) 50000 units CAPS capsule Take 50,000 Units by mouth 2 (two) times a week. On Wed and Fri per [facility].    [provider]    Physical Exam: BP (!) 127/52 (BP Location: Right Arm)   Pulse 62   Temp 97.6 F (36.4 C) (Oral)   Resp 20   Ht 5\' 10"  (1.778 m)   Wt (!) 141.6 kg   SpO2 99%   BMI 44.79 kg/m   . General: 79 y.o. year-old male well developed well nourished in no acute distress.  Alert and oriented x3. Marland Kitchen HEENT: NCAT, PERRLA, dry mucous membrane . Neck: Supple, trachea midline . Cardiovascular: Regular rate and rhythm with no rubs or gallops.  No thyromegaly or JVD noted.  No lower extremity edema. 2/4 pulses in all 4 extremities. Marland Kitchen Respiratory: Clear to auscultation with no wheezes or rales. Good inspiratory effort. . Abdomen: Soft nontender nondistended with normal bowel sounds x4 quadrants. . Muskuloskeletal: RUE strength 4/5,LUE 5/5, lower extremities bilaterally 5/5.  No cyanosis, clubbing or edema noted  bilaterally . Neuro:RUE drift on outstretched arm.  CN II-XII intact, strength, sensation, reflexes . Skin: No ulcerative lesions noted or rashes . Psychiatry: Judgement and insight appear normal. Mood is appropriate for condition and setting          Labs on Admission:  Basic Metabolic Panel: Recent Labs  Lab 10/03/19 2217 10/03/19 2224  NA 143 145  K 4.3 4.1  CL 113* 111  CO2 23  --   GLUCOSE 66* 61*  BUN 31* 30*  CREATININE 1.85* 1.90*  CALCIUM 9.1  --    Liver Function Tests: Recent Labs  Lab 10/03/19 2217  AST 15  ALT 12  ALKPHOS 68  BILITOT 1.1  PROT 6.5  ALBUMIN 3.7   No results for input(s): LIPASE, AMYLASE in the last 168 hours. No results for input(s): AMMONIA in the last 168 hours. CBC: Recent Labs  Lab 10/03/19 2217 10/03/19 2224  WBC 6.8  --   NEUTROABS 4.7  --   HGB 11.2* 11.2*  HCT 36.6* 33.0*  MCV 101.4*  --   PLT 180  --    Cardiac Enzymes: No results for input(s): CKTOTAL, CKMB, CKMBINDEX, TROPONINI in the last 168 hours.  BNP (last 3 results) No results for input(s): BNP in the last 8760 hours.  ProBNP (last 3 results) No results for input(s): PROBNP in the last 8760 hours.  CBG: Recent Labs  Lab 10/03/19 2215 10/03/19 2236 10/03/19 2328 10/04/19 0029 10/04/19 0240  GLUCAP 57* 86 69* 79 101*    Radiological Exams on Admission: CT HEAD CODE STROKE WO CONTRAST  Result Date: 10/03/2019 CLINICAL DATA:  Code stroke. Possible stroke, neuro deficit, acute, stroke suspected. Additional history obtained from [place] arm weakness, numbness and neglect, last known normal 10/02/2019 at 23:00 EXAM: CT HEAD WITHOUT CONTRAST TECHNIQUE: Contiguous axial images were obtained from the base of the skull through the vertex without intravenous contrast. COMPARISON:  No pertinent prior studies available for comparison. FINDINGS: Brain: There is mild generalized parenchymal atrophy. Mild ill-defined hypoattenuation within the  cerebral white matter is nonspecific, but consistent with chronic small vessel ischemic disease. There is no acute intracranial hemorrhage. No demarcated cortical infarct is identified. No extra-axial fluid collection. No evidence of intracranial mass. No midline shift. Vascular: No hyperdense vessel.  Atherosclerotic calcifications Skull: Normal. Negative for fracture or focal lesion. Sinuses/Orbits: Visualized orbits show no acute finding. No significant paranasal sinus disease or mastoid effusion at the imaged levels. ASPECTS [facility] Stroke Program Early CT Score) - Ganglionic level infarction (caudate, lentiform nuclei, internal capsule, insula, M1-M3 cortex): 7 - Supraganglionic infarction (M4-M6 cortex): 3 Total score (0-10 with 10 being normal): 10 These results were communicated to Dr. Rory Proctor At 10:34 pmon 6/5/2021by text page via the [facility] messaging system. IMPRESSION: 1. No CT evidence of acute intracranial abnormality.  ASPECTS is 10. 2. Mild generalized parenchymal atrophy and chronic small vessel ischemic disease Electronically Signed   By: Kellie Simmering DO   On: 10/03/2019 22:34    EKG: I independently viewed the EKG done and my findings are as followed: Sinus rhythm a rate of 68 bpm with ST depression in inferior leads and APCs.  Assessment/Plan Present on Admission: . Acute ischemic stroke ([place]) . Essential hypertension . Gout  Principal Problem:   Acute ischemic stroke [facility]) Active Problems:   Gout   Essential hypertension   Hypoglycemia due to insulin   CKD (chronic kidney disease), stage III   Dehydration   Dyslipidemia  Acute ischemic stroke Patient presents with right arm weakness CT head without contrast showed no acute ventricular abnormality. Patient will be admitted to telemetry unit  Echocardiogram in the morning MRI of brain without contrast in the morning MRA head and neck without contrast in the morning Continue aspirin and statin Continue fall precautions and  neuro checks Lipid panel and hemoglobin A1c will be checked Continue PT/ST/OT eval and treat Bedside swallow eval by nursing prior to diet Consider tele neurology consult status post imaging studies  Hypoglycemia in the setting of type 2 diabetes mellitus Dehydration Continue IVF D5 NS at 100 mls per hour Continuous insulin sliding scale and hypoglycemia protocol  CKD stage III BUN/creatinine 31/1.85 (last creatinine for comparison was 3 years ago with creatinine of 1.42-1.48) Rnally adjust medications, avoid nephrotoxic agents/dehydration/hypotension  Essential hypertension Permissive hypertension will be allowed at this time due to acute ischemic stroke  Hyperlipidemia Continue statin as indicated above  Gout Allopurinol will be held at this time due to CKD  Morbid obesity (BMI 44.79) Patient  will follow up with PCP for weight loss program   DVT prophylaxis: SCDs  Code Status: Full code  Family Communication: Wife at bedside (all questions answered to satisfaction)  Disposition Plan:  Patient is from:                        home Anticipated DC to:                   SNF or family members home Anticipated DC date:               2-3 days Anticipated DC barriers:         Clinical response to treatment   Consults called: Neurologist (Dr. Demetra Shiner)  Admission status: Observation    Bernadette Hoit MD [facility]  If 7PM-7AM, please contact night-coverage [email or web address]  10/04/2019, 3:35 AM

## 2019-10-04 NOTE — Progress Notes (Signed)
PROGRESS NOTE    Nathan Proctor.  NFA:[id number] DOB: November 27, 1940 DOA: 10/03/2019 PCP: Nolene Ebbs, MD   No chief complaint on file.   Brief Narrative:  Nathan Proctor. is Nathan Proctor 79 y.o. male with medical history significant for type 2 diabetes mellitus, CKD 3, morbid obesity, diabetic neuropathy, hypertension, hyperlipidemia who presents to the emergency department due to right-sided weakness noted when he woke up this morning around 5 AM.  Patient was last well known to be at baseline when he went to bed at 11:59 PM on 10/02/2019, on waking up this morning around 5 AM, he noted right arm weakness, but he did not think much of it because he thought it will self resolve shortly, unfortunately, the weakness did not improve, so he activated EMS and on arrival of EMS, patient was noted to exhibit some right-sided neglect and it was also hypoglycemic with blood glucose of 57.   1/2 ample of D50 was given in route to the ED.  He denies headache, chest pain, shortness of breath, fever, chills, nausea, vomiting or any sick contact.  ED Course:  In the emergency department, he was hemodynamically stable.  Work-up in the ED showed macrocytic anemia, BUN/creatinine 31/1.85 (last creatinine for comparison was 3 years ago with creatinine of 1.42-1.48).  Blood glucose was 66.  SARS coronavirus 2 was negative.  CT head without contrast showed no acute ventricular abnormality.  Neurologist (Dr. Rory Percy, Ashish) was consulted and recommended further stroke work-up.  Hospitalist was also admitted for further evaluation monitor.  Assessment & Plan:   Principal Problem:   Acute ischemic stroke [facility]) Active Problems:   Gout   Essential hypertension   Hypoglycemia due to insulin   CKD (chronic kidney disease), stage III   Dehydration   Dyslipidemia  Acute ischemic stroke Patient presents with right arm weakness CT head without contrast showed no acute intracranial abnormality. MRI brain with areas of  acute infarction affecting cortex in the L parietal region c/w L MCA branch vessel territory infarction, mild chronic small vessel change, smooth plaque affecting the ICA bulb on the L with stenosis of 30%, cannot r/o vertebral artery origin narrowing, no intracranial large or medium vessel occlusion, 2 mm L periopthalmic aneurysm (see report) Echocardiogram pending Continue aspirin and statin PT/OT/SLP A1c (5.6), lipid panel (LDL 105) Telemetry Appreciate neurology recommendations  Hypoglycemia in the setting of type 2 diabetes mellitus Dehydration A1c 5.6, per med rec, he's on 30 units 70/30 daily, this likely can be d/c'd or decreased - follow on SSI only  Continue D5NS, will d/c later in day and watch for hypoglycemia  CKD stage III BUN/creatinine 31/1.85 (last creatinine for comparison was 3 years ago with creatinine of 1.42-1.48) Improved to 1.59, follow with IVF Rnally adjust medications, avoid nephrotoxic agents/dehydration/hypotension  Essential hypertension Permissive hypertension will be allowed at this time due to acute ischemic stroke  Hyperlipidemia Continue statin as indicated above  Gout Allopurinol will be held at this time due to CKD  Morbid obesity (BMI 44.79) Patient will follow up with PCP for weight loss program  DVT prophylaxis: heparin Code Status: full  Family Communication: none at bedside Disposition:   Status is: Observation  The patient will require care spanning > 2 midnights and should be moved to inpatient because: Inpatient level of care appropriate due to severity of illness  Dispo: The patient is from: Home              Anticipated d/c is to:  CIR              Anticipated d/c date is: 1 day              Patient currently is not medically stable to d/c.   Consultants:   neurology  Procedures:    Antimicrobials:  Anti-infectives (From admission, onward)   None     Subjective: No complaints R arm weakness,  discoordination  Objective: Vitals:   10/04/19 0100 10/04/19 0115 10/04/19 0210 10/04/19 0750  BP: (!) 144/78 132/68 (!) 127/52 130/63  Pulse: 73 64 62 74  Resp: 19 15 20 19   Temp:   97.6 F (36.4 C) 97.9 F (36.6 C)  TempSrc:   Oral   SpO2: 98% 96% 99% 98%  Weight:   (!) 141.6 kg   Height:   5\' 10"  (1.778 m)    No intake or output data in the 24 hours ending 10/04/19 1249 Filed Weights   10/03/19 2226 10/04/19 0210  Weight: (!) 142.4 kg (!) 141.6 kg    Examination:  General exam: Appears calm and comfortable  Respiratory system: Clear to auscultation. Respiratory effort normal. Cardiovascular system: S1 & S2 heard, RRR. Gastrointestinal system: Abdomen is nondistended, soft and nontender Central nervous system: Alert and oriented.RUE weakness Extremities: Symmetric 5 x 5 power. Skin: No rashes, lesions or ulcers Psychiatry: Judgement and insight appear normal. Mood & affect appropriate.     Data Reviewed: I have personally reviewed following labs and imaging studies  CBC: Recent Labs  Lab 10/03/19 2217 10/03/19 2224 10/04/19 0531  WBC 6.8  --  5.4  NEUTROABS 4.7  --   --   HGB 11.2* 11.2* 10.9*  HCT 36.6* 33.0* 35.0*  MCV 101.4*  --  97.5  PLT 180  --  376    Basic Metabolic Panel: Recent Labs  Lab 10/03/19 2217 10/03/19 2224 10/04/19 0531  NA 143 145 140  K 4.3 4.1 4.0  CL 113* 111 109  CO2 23  --  24  GLUCOSE 66* 61* 251*  BUN 31* 30* 30*  CREATININE 1.85* 1.90* 1.59*  CALCIUM 9.1  --  9.1  MG  --   --  1.9  PHOS  --   --  3.2    GFR: Estimated Creatinine Clearance: 52.6 mL/min (Jermesha Sottile) (by C-G formula based on SCr of 1.59 mg/dL (H)).  Liver Function Tests: Recent Labs  Lab 10/03/19 2217 10/04/19 0531  AST 15 16  ALT 12 12  ALKPHOS 68 68  BILITOT 1.1 0.8  PROT 6.5 6.1*  ALBUMIN 3.7 3.2*    CBG: Recent Labs  Lab 10/03/19 2328 10/04/19 0029 10/04/19 0240 10/04/19 0449 10/04/19 0752  GLUCAP 69* 79 101* 239* 145*     Recent  Results (from the past 240 hour(s))  SARS Coronavirus 2 by RT PCR (hospital order, performed in [facility] hospital lab) Nasopharyngeal Nasopharyngeal Swab     Status: None   Collection Time: 10/03/19 10:41 PM   Specimen: Nasopharyngeal Swab  Result Value Ref Range Status   SARS Coronavirus 2 NEGATIVE NEGATIVE Final    Comment: (NOTE) SARS-CoV-2 target nucleic acids are NOT DETECTED. The SARS-CoV-2 RNA is generally detectable in upper and lower respiratory specimens during the acute phase of infection. The lowest concentration of SARS-CoV-2 viral copies this assay can detect is 250 copies / mL. Cristiana Yochim negative result does not preclude SARS-CoV-2 infection and should not be used as the sole basis for treatment or other patient management decisions.  Devrin Monforte negative result may occur with improper specimen collection / handling, submission of specimen other than nasopharyngeal swab, presence of viral mutation(s) within the areas targeted by this assay, and inadequate number of viral copies (<250 copies / mL). Terique Kawabata negative result must be combined with clinical observations, patient history, and epidemiological information. Fact Sheet for Patients:   StrictlyIdeas.no Fact Sheet for Healthcare Providers: BankingDealers.co.za This test is not yet approved or cleared  by the [place] FDA and has been authorized for detection and/or diagnosis of SARS-CoV-2 by FDA under an Emergency Use Authorization (EUA).  This EUA will remain in effect (meaning this test can be used) for the duration of the COVID-19 declaration under Section 564(b)(1) of the Act, 21 U.S.C. section 360bbb-3(b)(1), unless the authorization is terminated or revoked sooner. Performed at [facility], [place] [address], [place], [place] [address]          Radiology Studies: MR ANGIO HEAD WO CONTRAST  Result Date: 10/04/2019 CLINICAL DATA:  Right arm weakness and numbness.  Negative CT evaluation yesterday. EXAM: MRI HEAD WITHOUT CONTRAST MRA HEAD WITHOUT CONTRAST MRA NECK WITHOUT AND WITH CONTRAST TECHNIQUE: Multiplanar, multiecho pulse sequences of the brain and surrounding structures were obtained without and with intravenous contrast. Angiographic images of the Circle of Willis were obtained using MRA technique without intravenous contrast. Angiographic images of the neck were obtained using MRA technique without and with intravenous contrast. Carotid stenosis measurements (when applicable) are obtained utilizing NASCET criteria, using the distal internal carotid diameter as the denominator. CONTRAST:  13mL GADAVIST GADOBUTROL 1 MMOL/ML IV SOLN COMPARISON:  Head CT 10/03/2019 FINDINGS: MRI HEAD FINDINGS Brain: Diffusion imaging shows scattered acute infarctions affecting the left parietal cortex. No large confluent infarction. No evidence of swelling or hemorrhage. Elsewhere, the brainstem and cerebellum are normal. Cerebral hemispheres show minimal changes of small vessel disease affecting the white matter. No mass, hydrocephalus or extra-axial collection. Vascular: Major vessels at the base of the brain show flow. Skull and upper cervical spine: Negative Sinuses/Orbits: Clear/normal Other: None MRA HEAD FINDINGS Both internal carotid arteries are patent through the skull base and siphon regions. There is mild siphon atherosclerotic irregularity but no flow limiting stenosis. Probable 2 mm periophthalmic artery aneurysm on the left. The anterior and middle cerebral vessels are patent without proximal stenosis, aneurysm or vascular malformation. No missing large or medium-sized branch vessels are identified. Both vertebral arteries are widely patent to the basilar. No basilar stenosis. Posterior circulation branch vessels are normal. MRA NECK FINDINGS Both common carotid arteries are widely patent to the bifurcation. Both carotid bifurcations appear widely patent without flow  limiting stenosis. Smooth atherosclerotic plaque is present in the ICA bulb on the left, but narrowing is no more than 30%. Both vertebral arteries are patent with the left being dominant. Possible narrowing of the vertebral artery origins. Limited detail due to breathing motion. IMPRESSION: Areas of acute infarction affecting cortex in the left parietal region consistent with left MCA branch vessel territory infarction. Underlying white matter is largely spared. No swelling or hemorrhage. Mild chronic small-vessel change elsewhere affecting the cerebral hemispheric white matter. Smooth plaque affecting the ICA bulb on the left with stenosis of 30%. Cannot rule out vertebral artery origin narrowing. Detail is limited because of physiologic chest motion. No intracranial large or medium vessel occlusion identified presently. 2 mm left periophthalmic aneurysm. Electronically Signed   By: Nelson Chimes M.D.   On: 10/04/2019 07:45   MR ANGIO NECK W WO CONTRAST  Result Date: 10/04/2019 CLINICAL DATA:  Right arm weakness and numbness. Negative CT evaluation yesterday. EXAM: MRI HEAD WITHOUT CONTRAST MRA HEAD WITHOUT CONTRAST MRA NECK WITHOUT AND WITH CONTRAST TECHNIQUE: Multiplanar, multiecho pulse sequences of the brain and surrounding structures were obtained without and with intravenous contrast. Angiographic images of the Circle of Willis were obtained using MRA technique without intravenous contrast. Angiographic images of the neck were obtained using MRA technique without and with intravenous contrast. Carotid stenosis measurements (when applicable) are obtained utilizing NASCET criteria, using the distal internal carotid diameter as the denominator. CONTRAST:  93mL GADAVIST GADOBUTROL 1 MMOL/ML IV SOLN COMPARISON:  Head CT 10/03/2019 FINDINGS: MRI HEAD FINDINGS Brain: Diffusion imaging shows scattered acute infarctions affecting the left parietal cortex. No large confluent infarction. No evidence of swelling or  hemorrhage. Elsewhere, the brainstem and cerebellum are normal. Cerebral hemispheres show minimal changes of small vessel disease affecting the white matter. No mass, hydrocephalus or extra-axial collection. Vascular: Major vessels at the base of the brain show flow. Skull and upper cervical spine: Negative Sinuses/Orbits: Clear/normal Other: None MRA HEAD FINDINGS Both internal carotid arteries are patent through the skull base and siphon regions. There is mild siphon atherosclerotic irregularity but no flow limiting stenosis. Probable 2 mm periophthalmic artery aneurysm on the left. The anterior and middle cerebral vessels are patent without proximal stenosis, aneurysm or vascular malformation. No missing large or medium-sized branch vessels are identified. Both vertebral arteries are widely patent to the basilar. No basilar stenosis. Posterior circulation branch vessels are normal. MRA NECK FINDINGS Both common carotid arteries are widely patent to the bifurcation. Both carotid bifurcations appear widely patent without flow limiting stenosis. Smooth atherosclerotic plaque is present in the ICA bulb on the left, but narrowing is no more than 30%. Both vertebral arteries are patent with the left being dominant. Possible narrowing of the vertebral artery origins. Limited detail due to breathing motion. IMPRESSION: Areas of acute infarction affecting cortex in the left parietal region consistent with left MCA branch vessel territory infarction. Underlying white matter is largely spared. No swelling or hemorrhage. Mild chronic small-vessel change elsewhere affecting the cerebral hemispheric white matter. Smooth plaque affecting the ICA bulb on the left with stenosis of 30%. Cannot rule out vertebral artery origin narrowing. Detail is limited because of physiologic chest motion. No intracranial large or medium vessel occlusion identified presently. 2 mm left periophthalmic aneurysm. Electronically Signed   By: Nelson Chimes M.D.   On: 10/04/2019 07:45   MR BRAIN WO CONTRAST  Result Date: 10/04/2019 CLINICAL DATA:  Right arm weakness and numbness. Negative CT evaluation yesterday. EXAM: MRI HEAD WITHOUT CONTRAST MRA HEAD WITHOUT CONTRAST MRA NECK WITHOUT AND WITH CONTRAST TECHNIQUE: Multiplanar, multiecho pulse sequences of the brain and surrounding structures were obtained without and with intravenous contrast. Angiographic images of the Circle of Willis were obtained using MRA technique without intravenous contrast. Angiographic images of the neck were obtained using MRA technique without and with intravenous contrast. Carotid stenosis measurements (when applicable) are obtained utilizing NASCET criteria, using the distal internal carotid diameter as the denominator. CONTRAST:  83mL GADAVIST GADOBUTROL 1 MMOL/ML IV SOLN COMPARISON:  Head CT 10/03/2019 FINDINGS: MRI HEAD FINDINGS Brain: Diffusion imaging shows scattered acute infarctions affecting the left parietal cortex. No large confluent infarction. No evidence of swelling or hemorrhage. Elsewhere, the brainstem and cerebellum are normal. Cerebral hemispheres show minimal changes of small vessel disease affecting the white matter. No mass, hydrocephalus or extra-axial collection. Vascular: Major vessels at  the base of the brain show flow. Skull and upper cervical spine: Negative Sinuses/Orbits: Clear/normal Other: None MRA HEAD FINDINGS Both internal carotid arteries are patent through the skull base and siphon regions. There is mild siphon atherosclerotic irregularity but no flow limiting stenosis. Probable 2 mm periophthalmic artery aneurysm on the left. The anterior and middle cerebral vessels are patent without proximal stenosis, aneurysm or vascular malformation. No missing large or medium-sized branch vessels are identified. Both vertebral arteries are widely patent to the basilar. No basilar stenosis. Posterior circulation branch vessels are normal. MRA NECK  FINDINGS Both common carotid arteries are widely patent to the bifurcation. Both carotid bifurcations appear widely patent without flow limiting stenosis. Smooth atherosclerotic plaque is present in the ICA bulb on the left, but narrowing is no more than 30%. Both vertebral arteries are patent with the left being dominant. Possible narrowing of the vertebral artery origins. Limited detail due to breathing motion. IMPRESSION: Areas of acute infarction affecting cortex in the left parietal region consistent with left MCA branch vessel territory infarction. Underlying white matter is largely spared. No swelling or hemorrhage. Mild chronic small-vessel change elsewhere affecting the cerebral hemispheric white matter. Smooth plaque affecting the ICA bulb on the left with stenosis of 30%. Cannot rule out vertebral artery origin narrowing. Detail is limited because of physiologic chest motion. No intracranial large or medium vessel occlusion identified presently. 2 mm left periophthalmic aneurysm. Electronically Signed   By: Nelson Chimes M.D.   On: 10/04/2019 07:45   CT HEAD CODE STROKE WO CONTRAST  Result Date: 10/03/2019 CLINICAL DATA:  Code stroke. Possible stroke, neuro deficit, acute, stroke suspected. Additional history obtained from [place] arm weakness, numbness and neglect, last known normal 10/02/2019 at 23:00 EXAM: CT HEAD WITHOUT CONTRAST TECHNIQUE: Contiguous axial images were obtained from the base of the skull through the vertex without intravenous contrast. COMPARISON:  No pertinent prior studies available for comparison. FINDINGS: Brain: There is mild generalized parenchymal atrophy. Mild ill-defined hypoattenuation within the cerebral white matter is nonspecific, but consistent with chronic small vessel ischemic disease. There is no acute intracranial hemorrhage. No demarcated cortical infarct is identified. No extra-axial fluid collection. No evidence of intracranial mass. No  midline shift. Vascular: No hyperdense vessel.  Atherosclerotic calcifications Skull: Normal. Negative for fracture or focal lesion. Sinuses/Orbits: Visualized orbits show no acute finding. No significant paranasal sinus disease or mastoid effusion at the imaged levels. ASPECTS [facility] Stroke Program Early CT Score) - Ganglionic level infarction (caudate, lentiform nuclei, internal capsule, insula, M1-M3 cortex): 7 - Supraganglionic infarction (M4-M6 cortex): 3 Total score (0-10 with 10 being normal): 10 These results were communicated to Dr. Rory Percy At 10:34 pmon 6/5/2021by text page via the [facility] messaging system. IMPRESSION: 1. No CT evidence of acute intracranial abnormality.  ASPECTS is 10. 2. Mild generalized parenchymal atrophy and chronic small vessel ischemic disease Electronically Signed   By: Kellie Simmering DO   On: 10/03/2019 22:34        Scheduled Meds: . aspirin  325 mg Oral Once  . atorvastatin  80 mg Oral Daily  . insulin aspart  0-5 Units Subcutaneous QHS  . insulin aspart  0-9 Units Subcutaneous TID WC   Continuous Infusions:   LOS: 0 days    Time spent: over 30 min    Fayrene Helper, MD [facility]   To contact the attending provider between 7A-7P or the covering provider during after hours 7P-7A, please log into the web site [email or web address]  and access using universal [place] password for that web site. If you do not have the password, please call the hospital operator.  10/04/2019, 12:49 PM

## 2019-10-04 NOTE — Evaluation (Signed)
Clinical/Bedside Swallow Evaluation Patient Details  Name: Nathan Proctor. MRN: [id number] Date of Birth: 1940/07/07  Today's Date: 10/04/2019 Time: SLP Start Time (ACUTE ONLY): 0850 SLP Stop Time (ACUTE ONLY): 0906 SLP Time Calculation (min) (ACUTE ONLY): 16 min  Past Medical History:  Past Medical History:  Diagnosis Date  . Anemia of chronic disease   . Ankle fracture 02/15/2016  . Cancer [facility])    Prostate  . Chronic constipation   . Chronic kidney disease    stage III  . Diabetes mellitus without complication ([place])    type II   . Diabetic peripheral neuropathy ([place])   . Failure to thrive (0-17)   . Fracture of left lower leg   . Gout   . Hyperlipidemia   . Hypertension   . Morbid obesity ([place])   . Unstable gait    Past Surgical History:  Past Surgical History:  Procedure Laterality Date  . ORIF ANKLE FRACTURE Left 02/29/2016   Procedure: OPEN REDUCTION INTERNAL FIXATION (ORIF) ANKLE FRACTURE;  Surgeon: Nicholes Stairs, MD;  Location: [facility] ORS;  Service: Orthopedics;  Laterality: Left;  . PROSTATE SURGERY     HPI:  Nathan Proctor. is a 79 y.o. male with medical history significant for type 2 diabetes mellitus, CKD 3, morbid obesity, diabetic neuropathy, hypertension, hyperlipidemia who presents to the emergency department due to right-sided weakness.  MRI reported: "Areas of acute infarction affecting cortex in the left parietal region consistent with left MCA branch vessel territory infarction."    Assessment / Plan / Recommendation Clinical Impression  Pt was seen for a bedside swallow evaluation in the setting of an acute infarct and he presents with suspected functional oropharyngeal swallowing abilities.  Pt was encountered awake/alert and he was agreeable to this evaluation.  Oral mechanism exam was unremarkable.  Pt consumed trials of thin liquid, puree, and regular solids.  Pt fed himself independently and he demonstrated good bolus acceptance, timely  mastication, suspected timely AP transport/swallow initiation, and consistent hyolaryngeal elevation/excursion to observation and palpation.  No overt s/sx of aspiration were observed with any trials.  Recommend continuation of regular solids and thin liquids with medications administered whole with liquid.  No further skilled ST is warranted targeting dysphagia at this time.  Please re-consult if additional needs arise.    Pt denied acute changes with cognitive-linguistic abilities; however, due to infarction, pt would likely benefit from a comprehensive cognitive-linguistic evaluation to further evaluate.  MD please order if you agree.    SLP Visit Diagnosis: Dysphagia, unspecified (R13.10)    Aspiration Risk  No limitations    Diet Recommendation Regular;Thin liquid   Liquid Administration via: Cup;Straw Medication Administration: Whole meds with liquid Supervision: Patient able to self feed Compensations: Slow rate;Small sips/bites Postural Changes: Seated upright at 90 degrees    Other  Recommendations Oral Care Recommendations: Oral care BID   Follow up Recommendations None        Swallow Study   General HPI: Nathan Proctor. is a 79 y.o. male with medical history significant for type 2 diabetes mellitus, CKD 3, morbid obesity, diabetic neuropathy, hypertension, hyperlipidemia who presents to the emergency department due to right-sided weakness.  MRI reported: "Areas of acute infarction affecting cortex in the left parietal region consistent with left MCA branch vessel territory infarction."  Type of Study: Bedside Swallow Evaluation Previous Swallow Assessment: None  Diet Prior to this Study: Regular;Thin liquids Temperature Spikes Noted: No Respiratory Status: Room air History of  Recent Intubation: No Behavior/Cognition: Alert;Cooperative;Pleasant mood Oral Cavity Assessment: Within Functional Limits Oral Care Completed by SLP: No Oral Cavity - Dentition: Adequate  natural dentition Vision: Functional for self-feeding Self-Feeding Abilities: Able to feed self Patient Positioning: Upright in chair Baseline Vocal Quality: Normal Volitional Swallow: Able to elicit    Oral/Motor/Sensory Function Overall Oral Motor/Sensory Function: Within functional limits   Ice Chips Ice chips: Not tested   Thin Liquid Thin Liquid: Within functional limits Presentation: Cup;Straw;Self Fed    Nectar Thick Nectar Thick Liquid: Not tested   Honey Thick Honey Thick Liquid: Not tested   Puree Puree: Within functional limits Presentation: Spoon;Self Fed   Solid     Solid: Within functional limits Presentation: Nathan Proctor., M.S., [place] Office: [phone]   [place] 10/04/2019,9:07 AM

## 2019-10-04 NOTE — Evaluation (Addendum)
Occupational Therapy Evaluation Patient Details Name: Nathan Proctor. MRN: [id number] DOB: Mar 05, 1941 Today's Date: 10/04/2019    History of Present Illness Nathan Proctor. is a 79 y.o. male who has had a past medical history of diabetes, prostate cancer, CKD 3, diabetic neuropathy, morbid obesity, hypertension, hyperlipidemia, presenting with sudden onset of right arm weakness and slurred speech; MRI reported: "Areas of acute infarction affecting cortex in the left parietal region consistent with left MCA branch vessel territory infarction."     Clinical Impression   PTA, pt was living with his wife and was very independent; drives and business owner. Pt currently requiring Mod A for UB ADLs, Max A for LB ADLs, and Min A for functional mobility with RW. Pt presenting with decreased balance, activity tolerance, and functional use of RUE (dominant hand). Pt very motivated to participate in therapy. Pt would benefit from further acute OT to facilitate safe dc. Due to pt's motivation, PLOF, and deficits, recommend dc to CIR for intensive OT to optimize safety, independence with ADLs, and return to PLOF.    Follow Up Recommendations  CIR    Equipment Recommendations  3 in 1 bedside commode    Recommendations for Other Services PT consult     Precautions / Restrictions Precautions Precautions: Fall      Mobility Bed Mobility Overal bed mobility: Needs Assistance Bed Mobility: Supine to Sit     Supine to sit: Min guard(without physical contact)     General bed mobility comments: In recliner upon arrival  Transfers Overall transfer level: Needs assistance Equipment used: Rolling walker (2 wheeled) Transfers: Sit to/from Stand Sit to Stand: Min assist         General transfer comment: Min A for slight posterior lean    Balance Overall balance assessment: Needs assistance Sitting-balance support: No upper extremity supported;Feet supported Sitting balance-Leahy  Scale: Fair     Standing balance support: Single extremity supported;No upper extremity supported;During functional activity Standing balance-Leahy Scale: Poor Standing balance comment: consistently reaching out for UE support with standing activities                           ADL either performed or assessed with clinical judgement   ADL Overall ADL's : Needs assistance/impaired Eating/Feeding: Minimal assistance;Sitting Eating/Feeding Details (indicate cue type and reason): Difficulty with bilateral coorindation and grasp at RUE Grooming: Minimal assistance;Sitting   Upper Body Bathing: Moderate assistance;Sitting   Lower Body Bathing: Maximal assistance;Sit to/from stand   Upper Body Dressing : Moderate assistance;Sitting   Lower Body Dressing: Maximal assistance;Sit to/from stand Lower Body Dressing Details (indicate cue type and reason): Max A for sock management and  Toilet Transfer: Minimal assistance;Ambulation;RW(simulated to recliner)           Functional mobility during ADLs: Minimal assistance;Rolling walker General ADL Comments: Pt with decreased functional use of RUE, poor balance, and slow processing.      Vision Baseline Vision/History: No visual deficits Patient Visual Report: No change from baseline Vision Assessment?: Yes Eye Alignment: Within Functional Limits Ocular Range of Motion: Within Functional Limits Alignment/Gaze Preference: Within Defined Limits Tracking/Visual Pursuits: Decreased smoothness of horizontal tracking;Decreased smoothness of vertical tracking Additional Comments: Decreased smooth tracking. Able to track to all four quads. Will need to further assess.     Perception     Praxis      Pertinent Vitals/Pain Pain Assessment: No/denies pain     Hand Dominance Right  Extremity/Trunk Assessment Upper Extremity Assessment Upper Extremity Assessment: RUE deficits/detail RUE Deficits / Details: Weakness at grasp and  gross motor. Unable to perform opposition to finger and pinky. Poor rapid alternating movement and gross motor coordination. Decreased awareness of RUE. Difficulty opening tooth paste and managing tooth brush.  RUE Coordination: decreased fine motor;decreased gross motor   Lower Extremity Assessment Lower Extremity Assessment: Defer to PT evaluation RLE Deficits / Details: R hip flexor 3+/5; quad 4/5; hamstring tested seated 4-/5 RLE Coordination: decreased gross motor   Cervical / Trunk Assessment Cervical / Trunk Assessment: Other exceptions Cervical / Trunk Exceptions: incr body habitus   Communication Communication Communication: No difficulties   Cognition Arousal/Alertness: Awake/alert Behavior During Therapy: WFL for tasks assessed/performed Overall Cognitive Status: Impaired/Different from baseline Area of Impairment: Safety/judgement;Problem solving;Awareness                         Safety/Judgement: Decreased awareness of safety;Decreased awareness of deficits Awareness: Emergent Problem Solving: Slow processing;Requires verbal cues General Comments: Pt requiring increased time and cues. Difficulty problem solving recliner chair as well as opening tooth brush packet. Poor awareness of RUE. Pt very motivated and wants to participate in therapy, also concerned about his businesses and managing them   General Comments  BP 135/87 and 163/90 before and after activity    Exercises     Shoulder Instructions      Home Living Family/patient expects to be discharged to:: Private residence Living Arrangements: Spouse/significant other Available Help at Discharge: Family;Available PRN/intermittently(Wife is a Professor) Type of Home: House Home Access: Stairs to enter [employer] of Steps: 3 Entrance Stairs-Rails: None Home Layout: One level     Bathroom Shower/Tub: Other (comment)(tub with a door)         Home Equipment: Cane - single point           Prior Functioning/Environment Level of Independence: Needs assistance  Gait / Transfers Assistance Needed: Uses a cane occasionally; "not as often as maybe I should" ADL's / Homemaking Assistance Needed: Pt reports he does ADLs and owns/runs several business   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, sports, and manages many endeavors        OT Problem List: Decreased strength;Decreased range of motion;Decreased activity tolerance;Impaired balance (sitting and/or standing);Decreased knowledge of use of DME or AE;Decreased knowledge of precautions;Impaired UE functional use;Decreased cognition      OT Treatment/Interventions: Self-care/ADL training;Therapeutic exercise;Energy conservation;DME and/or AE instruction;Therapeutic activities;Patient/family education    OT Goals(Current goals can be found in the care plan section) Acute Rehab OT Goals Patient Stated Goal: Agreeable to therapy OT Goal Formulation: With patient Time For Goal Achievement: 10/18/19 Potential to Achieve Goals: Good  OT Frequency: Min 2X/week   Barriers to D/C:            Co-evaluation              AM-PAC OT "6 Clicks" Daily Activity     Outcome Measure Help from another person eating meals?: A Little Help from another person taking care of personal grooming?: A Little Help from another person toileting, which includes using toliet, bedpan, or urinal?: A Little Help from another person bathing (including washing, rinsing, drying)?: A Lot Help from another person to put on and taking off regular upper body clothing?: A Little Help from another person to put on and taking off regular lower body clothing?: A Lot 6 Click Score: 16   End of Session Equipment Utilized During Treatment:  Rolling walker Nurse Communication: Mobility status  Activity Tolerance: Patient tolerated treatment well Patient left: in chair;with call bell/phone within reach;with chair alarm set  OT Visit Diagnosis: Unsteadiness on feet  (R26.81);Other abnormalities of gait and mobility (R26.89);Muscle weakness (generalized) (M62.81);Hemiplegia and hemiparesis Hemiplegia - Right/Left: Right Hemiplegia - dominant/non-dominant: Dominant Hemiplegia - caused by: Cerebral infarction                Time: 4373-5789 OT Time Calculation (min): 29 min Charges:  OT General Charges $OT Visit: 1 Visit OT Evaluation $OT Eval Moderate Complexity: 1 Mod OT Treatments $Self Care/Home Management : 8-22 mins  Liliauna Santoni MSOT, OTR/L Acute Rehab Pager: [phone] Office: [place] 10/04/2019, 12:22 PM

## 2019-10-05 ENCOUNTER — Inpatient Hospital Stay (HOSPITAL_COMMUNITY): Payer: Medicare PPO

## 2019-10-05 ENCOUNTER — Encounter (HOSPITAL_COMMUNITY): Payer: Self-pay | Admitting: Family Medicine

## 2019-10-05 DIAGNOSIS — I6389 Other cerebral infarction: Secondary | ICD-10-CM

## 2019-10-05 DIAGNOSIS — I639 Cerebral infarction, unspecified: Secondary | ICD-10-CM

## 2019-10-05 LAB — COMPREHENSIVE METABOLIC PANEL
ALT: 11 U/L (ref 0–44)
AST: 14 U/L — ABNORMAL LOW (ref 15–41)
Albumin: 3.4 g/dL — ABNORMAL LOW (ref 3.5–5.0)
Alkaline Phosphatase: 71 U/L (ref 38–126)
Anion gap: 8 (ref 5–15)
BUN: 26 mg/dL — ABNORMAL HIGH (ref 8–23)
CO2: 25 mmol/L (ref 22–32)
Calcium: 9.2 mg/dL (ref 8.9–10.3)
Chloride: 109 mmol/L (ref 98–111)
Creatinine, Ser: 1.5 mg/dL — ABNORMAL HIGH (ref 0.61–1.24)
GFR calc Af Amer: 50 mL/min — ABNORMAL LOW (ref >60)
GFR calc non Af Amer: 43 mL/min — ABNORMAL LOW (ref >60)
Glucose, Bld: 101 mg/dL — ABNORMAL HIGH (ref 70–99)
Potassium: 3.9 mmol/L (ref 3.5–5.1)
Sodium: 142 mmol/L (ref 135–145)
Total Bilirubin: 0.8 mg/dL (ref 0.3–1.2)
Total Protein: 6.3 g/dL — ABNORMAL LOW (ref 6.5–8.1)

## 2019-10-05 LAB — CBC WITH DIFFERENTIAL/PLATELET
Abs Immature Granulocytes: 0.01 10*3/uL (ref 0.00–0.07)
Basophils Absolute: 0 10*3/uL (ref 0.0–0.1)
Basophils Relative: 0 %
Eosinophils Absolute: 0.1 10*3/uL (ref 0.0–0.5)
Eosinophils Relative: 2 %
HCT: 34.5 % — ABNORMAL LOW (ref 39.0–52.0)
Hemoglobin: 11 g/dL — ABNORMAL LOW (ref 13.0–17.0)
Immature Granulocytes: 0 %
Lymphocytes Relative: 35 %
Lymphs Abs: 1.9 10*3/uL (ref 0.7–4.0)
MCH: 30.8 pg (ref 26.0–34.0)
MCHC: 31.9 g/dL (ref 30.0–36.0)
MCV: 96.6 fL (ref 80.0–100.0)
Monocytes Absolute: 0.6 10*3/uL (ref 0.1–1.0)
Monocytes Relative: 11 %
Neutro Abs: 2.8 10*3/uL (ref 1.7–7.7)
Neutrophils Relative %: 52 %
Platelets: 184 10*3/uL (ref 150–400)
RBC: 3.57 MIL/uL — ABNORMAL LOW (ref 4.22–5.81)
RDW: 13.2 % (ref 11.5–15.5)
WBC: 5.4 10*3/uL (ref 4.0–10.5)
nRBC: 0 % (ref 0.0–0.2)

## 2019-10-05 LAB — ECHOCARDIOGRAM COMPLETE
Height: 70 in
Weight: 4994.74 oz

## 2019-10-05 LAB — GLUCOSE, CAPILLARY
Glucose-Capillary: 100 mg/dL — ABNORMAL HIGH (ref 70–99)
Glucose-Capillary: 151 mg/dL — ABNORMAL HIGH (ref 70–99)
Glucose-Capillary: 154 mg/dL — ABNORMAL HIGH (ref 70–99)
Glucose-Capillary: 125 mg/dL — ABNORMAL HIGH (ref 70–99)

## 2019-10-05 LAB — PHOSPHORUS: Phosphorus: 3.3 mg/dL (ref 2.5–4.6)

## 2019-10-05 LAB — MAGNESIUM: Magnesium: 1.9 mg/dL (ref 1.7–2.4)

## 2019-10-05 MED ORDER — CLOPIDOGREL BISULFATE 75 MG PO TABS
75.0000 mg | ORAL_TABLET | Freq: Every day | ORAL | Status: DC
  Administered 2019-10-05 – 2019-10-07 (×3): 75 mg via ORAL
  Filled 2019-10-05 (×3): qty 1

## 2019-10-05 MED ORDER — PERFLUTREN LIPID MICROSPHERE
1.0000 mL | INTRAVENOUS | Status: AC | PRN
  Filled 2019-10-05: qty 10

## 2019-10-05 MED ORDER — ASPIRIN EC 81 MG PO TBEC
81.0000 mg | DELAYED_RELEASE_TABLET | Freq: Every day | ORAL | Status: DC
  Administered 2019-10-06 – 2019-10-07 (×2): 81 mg via ORAL
  Filled 2019-10-05 (×2): qty 1

## 2019-10-05 NOTE — Progress Notes (Signed)
Physical Therapy Treatment Patient Details Name: Nathan Proctor. MRN: [id number] DOB: Feb 05, 1941 Today's Date: 10/05/2019    History of Present Illness Nathan Proctor. is a 79 y.o. male who has had a past medical history of diabetes, prostate cancer, CKD 3, diabetic neuropathy, morbid obesity, hypertension, hyperlipidemia, presenting with sudden onset of right arm weakness and slurred speech; MRI reported: "Areas of acute infarction affecting cortex in the left parietal region consistent with left MCA branch vessel territory infarction."      PT Comments    Pt tolerated repeated bouts of gait with multiple standing rest breaks for fatigue and dyspnea, pt unsteady requiring PT assist and reaching for environment. Pt tolerated RUE and LE exercises well this session, PT focusing on RUE coordination/strengthening, core strength, and LE muscular endurance. Pt motivated to d/c to CIR and return to PLOF, will continue to follow acutely.    Follow Up Recommendations  CIR;Supervision/Assistance - 24 hour     Equipment Recommendations  Rolling walker with 5" wheels    Recommendations for Other Services       Precautions / Restrictions Precautions Precautions: Fall Restrictions Weight Bearing Restrictions: No    Mobility  Bed Mobility Overal bed mobility: Needs Assistance Bed Mobility: Supine to Sit;Sit to Supine     Supine to sit: Min guard;HOB elevated Sit to supine: Min guard;HOB elevated   General bed mobility comments: use of bedrails, increased HOB elevation, and increased time to perform.  Transfers Overall transfer level: Needs assistance Equipment used: 1 person hand held assist Transfers: Sit to/from Stand Sit to Stand: Min assist         General transfer comment: min assist for initial power up, steadying. Transitioned to min guard for safety with repeated sit to stand as intervention.  Ambulation/Gait Ambulation/Gait assistance: Min assist Gait  Distance (Feet): 60 Feet(4x50 ft, standing rest breaks) Assistive device: 1 person hand held assist Gait Pattern/deviations: Wide base of support;Step-through pattern;Decreased stride length;Drifts right/left Gait velocity: decr   General Gait Details: min assist to steady, pt with large lateral excursion bilaterally which pt describes as "weeble wobbles". Decreased stance time noted RLE due to functional weakness, verbal cuing for increased RLE knee flexion during swing phase.   Stairs             Wheelchair Mobility    Modified Rankin (Stroke Patients Only) Modified Rankin (Stroke Patients Only) Pre-Morbid Rankin Score: No symptoms Modified Rankin: Moderately severe disability     Balance Overall balance assessment: Needs assistance Sitting-balance support: No upper extremity supported;Feet supported Sitting balance-Leahy Scale: Fair     Standing balance support: No upper extremity supported;During functional activity Standing balance-Leahy Scale: Fair Standing balance comment: able to ambulate without AD, occasional HHA or reaching for environment                            Cognition Arousal/Alertness: Awake/alert Behavior During Therapy: WFL for tasks assessed/performed Overall Cognitive Status: Impaired/Different from baseline Area of Impairment: Safety/judgement;Problem solving;Awareness                         Safety/Judgement: Decreased awareness of safety;Decreased awareness of deficits Awareness: Emergent Problem Solving: Slow processing;Requires verbal cues;Requires tactile cues General Comments: Multimodal cuing for safe mobility and performance of exercises, poor RUE coordinatio and awareness of deficits.      Exercises General Exercises - Lower Extremity Ankle Circles/Pumps: AROM;Left;5 reps;Seated(encouraged pt to perform  bilateral ankle pumps frequently for edema management) Mini-Sqauts: AROM;5 reps;Both;Seated;Standing(sit to  stands from EOB with emphasis on slow eccentric lowering, pt with use of UEs pushing from knees) Other Exercises Other Exercises: RUE D1 flexion with emphasis on wrist and finger flexion, target of PT hand on L side of pt, x10 reps Other Exercises: Lateral propping on elbow to upright, x5 each direction. Min assist for placement of R forearm on bed and power up to elbow extension    General Comments General comments (skin integrity, edema, etc.): HR 120s with exertion, RRmax 30 breaths/min      Pertinent Vitals/Pain Pain Assessment: Faces Faces Pain Scale: Hurts a little bit Pain Location: ankles, L>R, due to tightness of socks Pain Descriptors / Indicators: Tightness Pain Intervention(s): Monitored during session;Limited activity within patient's tolerance;Other (comment)(changed pt to XXXL socks)    Home Living   Living Arrangements: Spouse/significant other;Children(13 year old son, [name] lives with them) Available Help at Discharge: Family;Available 24 hours/day                Prior Function            PT Goals (current goals can now be found in the care plan section) Acute Rehab PT Goals Patient Stated Goal: Agreeable to therapy PT Goal Formulation: With patient Time For Goal Achievement: 10/18/19 Potential to Achieve Goals: Good Progress towards PT goals: Progressing toward goals    Frequency    Min 4X/week      PT Plan Current plan remains appropriate    Co-evaluation              AM-PAC PT "6 Clicks" Mobility   Outcome Measure  Help needed turning from your back to your side while in a flat bed without using bedrails?: A Little Help needed moving from lying on your back to sitting on the side of a flat bed without using bedrails?: A Little Help needed moving to and from a bed to a chair (including a wheelchair)?: A Little Help needed standing up from a chair using your arms (e.g., wheelchair or bedside chair)?: A Little Help needed to walk in  hospital room?: A Little Help needed climbing 3-5 steps with a railing? : A Lot 6 Click Score: 17    End of Session Equipment Utilized During Treatment: Gait belt Activity Tolerance: Patient tolerated treatment well;Patient limited by fatigue Patient left: with call bell/phone within reach;in bed;with family/visitor present;with bed alarm set Nurse Communication: Mobility status PT Visit Diagnosis: Unsteadiness on feet (R26.81);Other abnormalities of gait and mobility (R26.89);Hemiplegia and hemiparesis Hemiplegia - Right/Left: Right Hemiplegia - dominant/non-dominant: Dominant Hemiplegia - caused by: Cerebral infarction     Time: 6503-5465 PT Time Calculation (min) (ACUTE ONLY): 26 min  Charges:  $Gait Training: 8-22 mins $Therapeutic Exercise: 8-22 mins                     Darnelle Derrick E, PT Acute Rehabilitation Services Pager [phone]  Office [phone]   [place] Hijazi D Roe Koffman 10/05/2019, 5:09 PM

## 2019-10-05 NOTE — Progress Notes (Signed)
STROKE TEAM PROGRESS NOTE   INTERVAL HISTORY He is sitting up in bed.  He complains of mild dizziness.  His strength is improving.  He was interested in participating in the BMS stroke trial but his wife took too long to make up her mind and he is now out of the window to participate.  Echocardiogram   is yet pending.  Therapy evaluation suggest inpatient rehab.    OBJECTIVE Vitals:   10/05/19 0218 10/05/19 0319 10/05/19 0400 10/05/19 0735  BP:   (!) 139/58 123/68  Pulse:   71 72  Resp: 17 14  18   Temp:   98.2 F (36.8 C) 98.1 F (36.7 C)  TempSrc:   Oral Oral  SpO2:   100% 96%  Weight:      Height:        CBC:  Recent Labs  Lab 10/03/19 2217 10/03/19 2224 10/04/19 0531 10/05/19 0243  WBC 6.8   < > 5.4 5.4  NEUTROABS 4.7  --   --  2.8  HGB 11.2*   < > 10.9* 11.0*  HCT 36.6*   < > 35.0* 34.5*  MCV 101.4*   < > 97.5 96.6  PLT 180   < > 167 184   < > = values in this interval not displayed.    Basic Metabolic Panel:  Recent Labs  Lab 10/04/19 0531 10/05/19 0243  NA 140 142  K 4.0 3.9  CL 109 109  CO2 24 25  GLUCOSE 251* 101*  BUN 30* 26*  CREATININE 1.59* 1.50*  CALCIUM 9.1 9.2  MG 1.9 1.9  PHOS 3.2 3.3    Lipid Panel:     Component Value Date/Time   CHOL 164 10/04/2019 0531   TRIG 45 10/04/2019 0531   HDL 50 10/04/2019 0531   CHOLHDL 3.3 10/04/2019 0531   VLDL 9 10/04/2019 0531   LDLCALC 105 (H) 10/04/2019 0531   HgbA1c:  Lab Results  Component Value Date   HGBA1C 5.6 10/04/2019   Urine Drug Screen: No results found for: LABOPIA, COCAINSCRNUR, LABBENZ, AMPHETMU, THCU, LABBARB  Alcohol Level No results found for: [place]  IMAGING  MR BRAIN WO CONTRAST MR ANGIO HEAD WO CONTRAST MR ANGIO NECK W WO CONTRAST 10/04/2019 IMPRESSION:  Areas of acute infarction affecting cortex in the left parietal region consistent with left MCA branch vessel territory infarction. Underlying white matter is largely spared. No swelling or hemorrhage.  Mild chronic  small-vessel change elsewhere affecting the cerebral hemispheric white matter.  Smooth plaque affecting the ICA bulb on the left with stenosis of 30%. Cannot rule out vertebral artery origin narrowing. Detail is limited because of physiologic chest motion.  No intracranial large or medium vessel occlusion identified presently. 2 mm left periophthalmic aneurysm.   CT HEAD CODE STROKE WO CONTRAST 10/03/2019 IMPRESSION:  1. No CT evidence of acute intracranial abnormality.  ASPECTS is 10.  2. Mild generalized parenchymal atrophy and chronic small vessel ischemic disease   Transthoracic Echocardiogram  00/00/2021 Pending  ECG - SR rate 68 BPM. (See cardiology reading for complete details)    PHYSICAL EXAM Blood pressure 123/68, pulse 72, temperature 98.1 F (36.7 C), temperature source Oral, resp. rate 18, height 5\' 10"  (1.778 m), weight (!) 141.6 kg, SpO2 96 %. Obese elderly African-American male not in distress. . Afebrile. Head is nontraumatic. Neck is supple without bruit.    Cardiac exam no murmur or gallop. Lungs are clear to auscultation. Distal pulses are well felt. Neurological Exam : Awake  alert oriented to time place and person.  Speech is clear with only mild dysarthria.  Extraocular movements are full range without nystagmus.  Face is symmetric without weakness.  Tongue midline.  Motor system exam shows mild weakness of right grip and intrinsic hand muscles.  Orbits left over right upper extremity.  Lower extremity strength testing seems symmetric.  Sensation is intact.  Deep tendon reflexes are symmetric.  Plantars downgoing.  Gait not tested.   .    ASSESSMENT/PLAN [name]. is a 79 y.o. male with history of diabetes, prostate cancer, CKD 3, diabetic neuropathy, morbid obesity, anemia, hypertension, hyperlipidemia, presented with right-sided weakness and neglect. Blood glucose was 57 per EMTs.  He did not receive IV t-PA due to late presentation (>4.5 hours  from time of onset).  Stroke: left MCA infarct - embolic - source unknown  Resultant mild right upper extremity weakness  Code Stroke CT Head - No CT evidence of acute intracranial abnormality.  ASPECTS is 10. Mild generalized parenchymal atrophy and chronic small vessel ischemic disease   CT head - not ordered  MRI head - Areas of acute infarction affecting cortex in the left parietal region consistent with left MCA branch vessel territory infarction. Underlying white matter is largely spared. No swelling or hemorrhage. Mild chronic small-vessel change elsewhere affecting the cerebral hemispheric white matter.   MRA H&N - Smooth plaque affecting the ICA bulb on the left with stenosis of 30%. Cannot rule out vertebral artery origin narrowing. Detail is limited because of physiologic chest motion. No intracranial large or medium vessel occlusion identified presently. 2 mm left periophthalmic aneurysm.   CTA H&N - not ordered  CT Perfusion - not ordered  Carotid Doppler - MRA neck performed - carotid dopplers not indicated.  2D Echo - pending  Lacey Jensen Virus 2 - negative  LDL - 105  HgbA1c - 5.6  UDS - not ordered  VTE prophylaxis - SCDs Diet  Diet Order            Diet heart healthy/carb modified Room service appropriate? Yes; Fluid consistency: Thin  Diet effective now              No antithrombotic prior to admission, now on aspirin 81 and Plavix 75 mg daily into 3 weeks followed by aspirin alone  Patient counseled to be compliant with his antithrombotic medications  Ongoing aggressive stroke risk factor management  Therapy recommendations:  CIR recommended  Disposition:  Pending  Hypertension  Home BP meds: Norvasc ; Coreg  Current BP meds: none   Stable . Permissive hypertension (OK if < 220/120) but gradually normalize in 5-7 days . Long-term BP goal normotensive  Hyperlipidemia  Home Lipid lowering medication: Lipitor 40 mg daiy  LDL 105, goal  < 70  Current lipid lowering medication: Lipitor 80 mg daily  Continue statin at discharge  Diabetes  Home diabetic meds: insulin  Current diabetic meds: insulin  HgbA1c 5.6, goal < 7.0 Recent Labs    10/04/19 2120 10/05/19 0739 10/05/19 1134  GLUCAP 119* 100* 151*    Other Stroke Risk Factors  Advanced age  Former cigarette smoker - quit  ETOH use, advised to drink no more than 1 alcoholic beverage per day.  Obesity, Body mass index is 44.79 kg/m., recommend weight loss, diet and exercise as appropriate   Family hx stroke - not on file  Other Active Problems  Code status - Full code  2 mm left periophthalmic aneurysm.  Anemia of chronic disease - Hb - 10.9  CKD stage 3 creatinine - 1.85->1.90->1.59    Hospital day # 1 He presented with embolic left MCA branch infarct etiology to be determined.  Continue aspirin and Plavix for 3 weeks followed by aspirin alone and ongoing stroke work-up and aggressive risk factor modification.  Check echocardiogram results.  Recommend loop recorder insertion prior to discharge to look for paroxysmal A. fib.  TEE is not indicated given patient's age it is likely going to be low yield.  Transfer to inpatient rehab when bed available after insurance approval in the next few days.  Discussed with Dr. Florene Glen.  Stroke team will sign off.  Follow-up as an outpatient stroke clinic in 6 6 weeks.   .  Greater than 50% time during this 25-minute visit was spent on counseling and coordination of care about his embolic stroke and answering questions. Antony Contras, MD Medical Director [place] Pager: [phone] 10/05/2019 12:36 PM To contact Stroke Continuity provider, please refer to [email or web address] After hours, contact General Neurology
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
## 2019-10-05 NOTE — PMR Pre-admission (Addendum)
PMR Admission Coordinator Pre-Admission Assessment  Patient: Nathan Proctor. is an 79 y.o., male MRN: [id number] DOB: Apr 20, 1941 Height: 5\' 10"  (177.8 cm) Weight: (!) 141.6 kg              Insurance Information  PRIMARY: [employer]      Policy#: [id number]      Subscriber: pt CM Name: Dot      Phone#: [phone] ext [phone]    Fax#: [phone] Pre-Cert#: [id number] approved for 7 days with f/u with Jeanette Caprice ext [phone]      Employer:  Benefits:  Phone #: [phone]     Name:  Eff. Date: 05/01/2019     Deduct: $500      Out of Pocket Max: $6700      Life Max: none  CIR: $450 per day days 1 until 4      SNF: 100% coverage Outpatient: $20 to $40 per visit     Co-Pay: visits limited per medcial neccesity Home Health: 100%      Co-Pay: bisits per medcial neccesity DME: 80%     Co-Pay: 20% Providers: in network  SECONDARY: none      Policy#:       Phone#:   Development worker, community:       Phone#:   [employer] Engineer, [employer]" for patients in Inpatient Rehabilitation Facilities with attached "Privacy Act [place] Records" was provided and verbally reviewed with: Patient and Family  Emergency Contact Information Contact Information     Name Relation Home Work [place] [phone]  [phone]      Current Medical History  Patient Admitting Diagnosis: CVA  History of Present Illness:  Nathan Proctor is an 80 year old male with history of HTN, T2DM, CKD III, morbid obesity, prostate cancer; who was admitted on 10/03/19 with persistent right-sided weakness and numbness that started on a.m. on 06/04.  Blood sugar-57 at admission and TPA not administered due to late presentation.  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  Dr. Leonie Man felt  the stroke was embolic due to unknown source and recommended DAPT x3 weeks followed by ASA alone.     2D echo done showing severe LVH with EF 45 to 50%, mild aortic sclerosis, aortic dilatation and global hypokinesis.  He has few episodes of NSVT ~ 16 seconds on 06/08 and cardiology consulted for input. Coreg resumed and  Repeat echo done revealing global hypokinesis with normal right RV and EF 40-45% with normal systolic function.  No signs of overload and lipitor increased to 80 mg. Dr. Harrington Challenger recommends 3wk Zio patch at discharge and further evaluation after recovery from CVA.  DM managed with SSI and acute on CKD improved with IVF for hydration. .        Therapy ongoing and patient limited by weakness, dizziness as well as DOE.  Complete NIHSS TOTAL: 0    Past Medical History  Past Medical History:  Diagnosis Date   Anemia of chronic disease    Ankle fracture 02/15/2016   Cancer ([place])    Prostate   Chronic constipation    Chronic kidney disease    stage III   Diabetes mellitus without complication ([place])    type II    Diabetic peripheral neuropathy ([facility])    Failure to thrive (0-17)  Fracture of left lower leg    Gout    Hyperlipidemia    Hypertension    Morbid obesity ([place])    Unstable gait     Family History  family history includes Heart Problems in his father and sister.  Prior Rehab/Hospitalizations:  Has the patient had prior rehab or hospitalizations prior to admission? Yes  Has the patient had major surgery during 100 days prior to admission? No  Current Medications   Current Facility-Administered Medications:    aspirin EC tablet 81 mg, 81 mg, Oral, Daily, Leonie Man, Pramod S, MD, 81 mg at 10/07/19 0917   atorvastatin (LIPITOR) tablet 80 mg, 80 mg, Oral, Daily, Adefeso, Oladapo, DO, 80 mg at 10/07/19 0916   carvedilol (COREG) tablet 12.5 mg, 12.5 mg, Oral, BID WC, Elodia Florence., MD, 12.5 mg at 10/07/19 2774   clopidogrel (PLAVIX) tablet 75 mg, 75 mg, Oral,  Daily, Elodia Florence., MD, 75 mg at 10/07/19 0917   heparin injection 5,000 Units, 5,000 Units, Subcutaneous, Q8H, Elodia Florence., MD, 5,000 Units at 10/07/19 1287   insulin aspart (novoLOG) injection 0-5 Units, 0-5 Units, Subcutaneous, QHS, Adefeso, Oladapo, DO, 2 Units at 10/06/19 2116   insulin aspart (novoLOG) injection 0-9 Units, 0-9 Units, Subcutaneous, TID WC, Adefeso, Oladapo, DO, 1 Units at 10/06/19 1804  Patients Current Diet:  Diet Order             Diet - low sodium heart healthy        Diet heart healthy/carb modified Room service appropriate? 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  Independent  Indoor Mobility: Did the patient need assistance with walking from room to room (with or without device)? Independent  Stairs: Did the patient need assistance with internal or external stairs (with or without device)? Independent  Functional Cognition: Did the patient need help planning regular tasks such as shopping or remembering to take medications? Independent  Home Assistive Devices / Equipment Home Assistive Devices/Equipment: None Home Equipment: Cane - single point  Prior Device Use: Indicate devices/aids used by the patient prior to current illness,  exacerbation or injury?  cane as needed  Current Functional Level Cognition  Overall Cognitive Status: Impaired/Different from baseline Current Attention Level: Sustained Orientation Level: Oriented to time, Oriented to place Following Commands: Follows one step commands with increased time Safety/Judgement: Decreased awareness of safety, Decreased awareness of deficits General Comments: A&Ox4 this day, with increased time required to respond to questions. Multimodal cuing and demonstration required to participate in tasks.    Extremity Assessment (includes Sensation/Coordination)  Upper Extremity Assessment: RUE deficits/detail RUE Deficits / Details: Weakness at grasp and gross motor. Unable to perform opposition to finger and pinky. Poor rapid alternating movement and gross motor coordination. Decreased awareness of RUE. Difficulty opening tooth  paste and managing tooth brush.  RUE Coordination: decreased fine motor, decreased gross motor  Lower Extremity Assessment: Defer to PT evaluation RLE Deficits / Details: R hip flexor 3+/5; quad 4/5; hamstring tested seated 4-/5 RLE Coordination: decreased gross motor    ADLs  Overall ADL's : Needs assistance/impaired Eating/Feeding: Minimal assistance, Sitting Eating/Feeding Details (indicate cue type and reason): Difficulty with bilateral coorindation and grasp at RUE Grooming: Minimal assistance, Sitting Grooming Details (indicate cue type and reason): issued red built up handled and educated in use for grooming items (toothbrush) as well as feeding utensils  Upper Body Bathing: Moderate assistance, Sitting Lower Body Bathing: Maximal assistance, Sit to/from stand Upper Body Dressing : Moderate assistance, Sitting Lower Body Dressing: Maximal assistance, Sit to/from stand Lower Body Dressing Details (indicate cue type and reason): Max A for sock management and  Toilet Transfer: Minimal assistance, Ambulation, RW(simulated to  recliner) Functional mobility during ADLs: Minimal assistance General ADL Comments: focus on RUE fine motor strengthening/coordination    Mobility  Overal bed mobility: Needs Assistance Bed Mobility: Supine to Sit Sidelying to sit: Min guard Supine to sit: Min assist Sit to supine: Min guard, HOB elevated Sit to sidelying: Min guard General bed mobility comments: Min assist for trunk elevation for pt to bring RUE forward, assist for scooting to EOB. Increased time and effort.    Transfers  Overall transfer level: Needs assistance Equipment used: None Transfers: Sit to/from Stand Sit to Stand: Min assist General transfer comment: Min assist for initial power up and steadying upon standing; sit to stand x4, from EOB x2, hallway chair, and recliner.    Ambulation / Gait / Stairs / Wheelchair Mobility  Ambulation/Gait Ambulation/Gait assistance: Min assist, Min guard Gait Distance (Feet): 75 Feet(75+30+50+55) Assistive device: None Gait Pattern/deviations: Wide base of support, Step-through pattern, Decreased stride length, Drifts right/left General Gait Details: Min guard for safety, occasional min assist to steady. Dynamic balance challenged, pt with mild to moderate difficulty accepting challenge (see balance section). Increased lateral leaning bilaterally with each step, LOB requiring PT assist to correct x1 with distraction in hallway. Gait velocity: decr    Posture / Balance Balance Overall balance assessment: Needs assistance Sitting-balance support: No upper extremity supported, Feet supported Sitting balance-Leahy Scale: Fair Standing balance support: No upper extremity supported, During functional activity Standing balance-Leahy Scale: Fair Standing balance comment: accepts mild challenge to gait High level balance activites: Head turns, Turns, Direction changes High Level Balance Comments: Mild deviations to gait path with horizontal head turning, LOB with distraction and  head turn in hallway    Special needs/care consideration  Hgb A1c 5.6 Designated visitor is wife, Ricci Barker   Previous Home Environment  Living Arrangements: Spouse/significant other, Children(12 year old son, [name] lives with them)  Lives With: Spouse, Son Available Help at Discharge: Family, Available 24 hours/day Type of Home: [place]: One level Home Access: Stairs to enter Entrance Stairs-Rails: None Entrance Stairs-Number of Steps: 3 Bathroom Shower/Tub: Other (comment)(tub with a door) Bathroom Toilet: Handicapped height Bathroom Accessibility: Yes How Accessible: Accessible via walker [place]: No  Discharge Living Setting Plans for Discharge Living Setting: Patient's home, Lives with (comment)(wife and adult son) Type of Home at Discharge: House Discharge Home Layout: One level Discharge Home Access: Stairs to enter Entrance Stairs-Rails: None Entrance Stairs-Number of Steps: 3 Discharge Bathroom Shower/Tub: Other (comment)(tub with a door) Discharge Bathroom Toilet: Handicapped height Discharge Bathroom Accessibility: Yes How Accessible: Accessible via walker Does the patient have any problems  obtaining your medications?: No  Social/Family/Support Systems Patient Roles: Spouse, Parent(business owner) Contact Information: wife, Ricci Barker Anticipated Caregiver: wife Anticipated Caregiver's Contact Information: see above Caregiver Availability: 24/7 Discharge Plan Discussed with Primary Caregiver: Yes Is Caregiver In Agreement with Plan?: Yes Does Caregiver/Family have Issues with Lodging/Transportation while Pt is in Rehab?: No  Goals Patient/Family Goal for Rehab: Mod I with PT and OT Expected length of stay: ELOS 7 to 10 days Pt/Family Agrees to Admission and willing to participate: Yes Program Orientation Provided & Reviewed with Pt/Caregiver Including Roles  & Responsibilities: Yes  Decrease burden of Care through IP rehab admission:  n/a  Possible need for SNF placement upon discharge: Not anticipated  Patient Condition: This patient's condition remains as documented in the consult dated 10/05/2019, in which the Rehabilitation Physician determined and documented that the patient's condition is appropriate for intensive rehabilitative care in an inpatient rehabilitation facility. Will admit to inpatient rehab today.  Preadmission Screen Completed By:  Cleatrice Burke, RN, 10/07/2019 11:49 AM ______________________________________________________________________   Discussed status with Dr. Posey Pronto on 10/07/2019 at  69 and received approval for admission today.  Admission Coordinator:  Cleatrice Burke, time 10/07/2019 Date 10/07/2019

## 2019-10-05 NOTE — Progress Notes (Signed)
  Echocardiogram 2D Echocardiogram has been performed.  Marybelle Killings 10/05/2019, 2:30 PM

## 2019-10-05 NOTE — Progress Notes (Addendum)
PROGRESS NOTE    Nathan Proctor.  Nathan Proctor:[id number] DOB: 1940/10/19 DOA: 10/03/2019 PCP: Nolene Ebbs, MD   No chief complaint on file.   Brief Narrative:  Nathan Proctor. is Nathan Proctor 79 y.o. male with medical history significant for type 2 diabetes mellitus, CKD 3, morbid obesity, diabetic neuropathy, hypertension, hyperlipidemia who presents to the emergency department due to right-sided weakness noted when he woke up this morning around 5 AM.  Patient was last well known to be at baseline when he went to bed at 11:59 PM on 10/02/2019, on waking up this morning around 5 AM, he noted right arm weakness, but he did not think much of it because he thought it will self resolve shortly, unfortunately, the weakness did not improve, so he activated EMS and on arrival of EMS, patient was noted to exhibit some right-sided neglect and it was also hypoglycemic with blood glucose of 57.   1/2 ample of D50 was given in route to the ED.  He denies headache, chest pain, shortness of breath, fever, chills, nausea, vomiting or any sick contact.  ED Course:  In the emergency department, he was hemodynamically stable.  Work-up in the ED showed macrocytic anemia, BUN/creatinine 31/1.85 (last creatinine for comparison was 3 years ago with creatinine of 1.42-1.48).  Blood glucose was 66.  SARS coronavirus 2 was negative.  CT head without contrast showed no acute ventricular abnormality.  Neurologist (Dr. Rory Percy, Ashish) was consulted and recommended further stroke work-up.  Hospitalist was also admitted for further evaluation monitor.  Assessment & Plan:   Principal Problem:   Acute ischemic stroke [facility]) Active Problems:   Gout   Essential hypertension   Hypoglycemia due to insulin   CKD (chronic kidney disease), stage III   Dehydration   Dyslipidemia   Stroke [facility])  Acute ischemic stroke Patient presents with right arm weakness CT head without contrast showed no acute intracranial abnormality. MRI  brain with areas of acute infarction affecting cortex in the L parietal region c/w L MCA branch vessel territory infarction, mild chronic small vessel change, smooth plaque affecting the ICA bulb on the L with stenosis of 30%, cannot r/o vertebral artery origin narrowing, no intracranial large or medium vessel occlusion, 2 mm L periopthalmic aneurysm (see report) Echocardiogram pending Continue DAPT x 3 weeks, then aspirin alone and statin PT/OT/SLP -> CIR A1c (5.6), lipid panel (LDL 105) Telemetry - sinus Appreciate neurology recommendations - thought to be embolic with unknown source - recommending DAPT x 3 weeks, then ASA alone - neurology recommending loop recorder as well    NSVT: 9 beats on tele, follow echo  Hypoglycemia in the setting of type 2 diabetes mellitus Dehydration A1c 5.6, per med rec, he's on 30 units 70/30 daily, this likely can be d/c'd or decreased - follow on SSI only   CKD stage III BUN/creatinine 31/1.85 (last creatinine for comparison was 3 years ago with creatinine of 1.42-1.48) Improved to 1.50, follow with IVF Renally adjust medications, avoid nephrotoxic agents/dehydration/hypotension  Essential hypertension Permissive hypertension will be allowed at this time due to acute ischemic stroke  Hyperlipidemia Continue statin as indicated above  Gout Allopurinol will be held at this time due to CKD  Morbid obesity (BMI 44.79) Patient will follow up with PCP for weight loss program  DVT prophylaxis: heparin Code Status: full  Family Communication: none at bedside Disposition:   Status is: Observation  The patient will require care spanning > 2 midnights and should be moved  to inpatient because: Inpatient level of care appropriate due to severity of illness  Dispo: The patient is from: Home              Anticipated d/c is to: CIR              Anticipated d/c date is: 1 day              Patient currently is not medically stable to d/c.    Consultants:   neurology  Procedures:    Antimicrobials:  Anti-infectives (From admission, onward)   None     Subjective: No complaints this AM  Objective: Vitals:   10/05/19 0218 10/05/19 0319 10/05/19 0400 10/05/19 0735  BP:   (!) 139/58 123/68  Pulse:   71 72  Resp: 17 14  18   Temp:   98.2 F (36.8 C) 98.1 F (36.7 C)  TempSrc:   Oral Oral  SpO2:   100% 96%  Weight:      Height:        Intake/Output Summary (Last 24 hours) at 10/05/2019 1206 Last data filed at 10/05/2019 0742 Gross per 24 hour  Intake 352.63 ml  Output 700 ml  Net -347.37 ml   Filed Weights   10/03/19 2226 10/04/19 0210  Weight: (!) 142.4 kg (!) 141.6 kg    Examination:  General: No acute distress. Cardiovascular: Heart sounds show Nathan Proctor regular rate, and rhythm. Lungs: Clear to auscultation bilaterally . Abdomen: Soft, nontender, nondistended Neurological: Alert and oriented 3. 4+/5 RUE strength. Cranial nerves II through XII grossly intact. Skin: Warm and dry. No rashes or lesions. Extremities: No clubbing or cyanosis. No edema.   Data Reviewed: I have personally reviewed following labs and imaging studies  CBC: Recent Labs  Lab 10/03/19 2217 10/03/19 2224 10/04/19 0531 10/05/19 0243  WBC 6.8  --  5.4 5.4  NEUTROABS 4.7  --   --  2.8  HGB 11.2* 11.2* 10.9* 11.0*  HCT 36.6* 33.0* 35.0* 34.5*  MCV 101.4*  --  97.5 96.6  PLT 180  --  167 161    Basic Metabolic Panel: Recent Labs  Lab 10/03/19 2217 10/03/19 2224 10/04/19 0531 10/05/19 0243  NA 143 145 140 142  K 4.3 4.1 4.0 3.9  CL 113* 111 109 109  CO2 23  --  24 25  GLUCOSE 66* 61* 251* 101*  BUN 31* 30* 30* 26*  CREATININE 1.85* 1.90* 1.59* 1.50*  CALCIUM 9.1  --  9.1 9.2  MG  --   --  1.9 1.9  PHOS  --   --  3.2 3.3    GFR: Estimated Creatinine Clearance: 55.8 mL/min ([name]) (by C-G formula based on SCr of 1.5 mg/dL (H)).  Liver Function Tests: Recent Labs  Lab 10/03/19 2217 10/04/19 0531 10/05/19 0243  AST  15 16 14*  ALT 12 12 11   ALKPHOS 68 68 71  BILITOT 1.1 0.8 0.8  PROT 6.5 6.1* 6.3*  ALBUMIN 3.7 3.2* 3.4*    CBG: Recent Labs  Lab 10/04/19 1252 10/04/19 1651 10/04/19 2120 10/05/19 0739 10/05/19 1134  GLUCAP 137* 167* 119* 100* 151*     Recent Results (from the past 240 hour(s))  SARS Coronavirus 2 by RT PCR (hospital order, performed in [facility] hospital lab) Nasopharyngeal Nasopharyngeal Swab     Status: None   Collection Time: 10/03/19 10:41 PM   Specimen: Nasopharyngeal Swab  Result Value Ref Range Status   SARS Coronavirus 2 NEGATIVE NEGATIVE Final  Comment: (NOTE) SARS-CoV-2 target nucleic acids are NOT DETECTED. The SARS-CoV-2 RNA is generally detectable in upper and lower respiratory specimens during the acute phase of infection. The lowest concentration of SARS-CoV-2 viral copies this assay can detect is 250 copies / mL. Loriana Samad negative result does not preclude SARS-CoV-2 infection and should not be used as the sole basis for treatment or other patient management decisions.  Sereen Schaff negative result may occur with improper specimen collection / handling, submission of specimen other than nasopharyngeal swab, presence of viral mutation(s) within the areas targeted by this assay, and inadequate number of viral copies (<250 copies / mL). [place]ont Skalicky negative result must be combined with clinical observations, patient history, and epidemiological information. Fact Sheet for Patients:   StrictlyIdeas.no Fact Sheet for Healthcare Providers: BankingDealers.co.za This test is not yet approved or cleared  by the [place] FDA and has been authorized for detection and/or diagnosis of SARS-CoV-2 by FDA under an Emergency Use Authorization (EUA).  This EUA will remain in effect (meaning this test can be used) for the duration of the COVID-19 declaration under Section 564(b)(1) of the Act, 21 U.S.C. section 360bbb-3(b)(1), unless the  authorization is terminated or revoked sooner. Performed at [facility], [place] [address], [place], [place] [address]          Radiology Studies: MR ANGIO HEAD WO CONTRAST  Result Date: 10/04/2019 CLINICAL DATA:  Right arm weakness and numbness. Negative CT evaluation yesterday. EXAM: MRI HEAD WITHOUT CONTRAST MRA HEAD WITHOUT CONTRAST MRA NECK WITHOUT AND WITH CONTRAST TECHNIQUE: Multiplanar, multiecho pulse sequences of the brain and surrounding structures were obtained without and with intravenous contrast. Angiographic images of the Circle of Willis were obtained using MRA technique without intravenous contrast. Angiographic images of the neck were obtained using MRA technique without and with intravenous contrast. Carotid stenosis measurements (when applicable) are obtained utilizing NASCET criteria, using the distal internal carotid diameter as the denominator. CONTRAST:  13mL GADAVIST GADOBUTROL 1 MMOL/ML IV SOLN COMPARISON:  Head CT 10/03/2019 FINDINGS: MRI HEAD FINDINGS Brain: Diffusion imaging shows scattered acute infarctions affecting the left parietal cortex. No large confluent infarction. No evidence of swelling or hemorrhage. Elsewhere, the brainstem and cerebellum are normal. Cerebral hemispheres show minimal changes of small vessel disease affecting the white matter. No mass, hydrocephalus or extra-axial collection. Vascular: Major vessels at the base of the brain show flow. Skull and upper cervical spine: Negative Sinuses/Orbits: Clear/normal Other: None MRA HEAD FINDINGS Both internal carotid arteries are patent through the skull base and siphon regions. There is mild siphon atherosclerotic irregularity but no flow limiting stenosis. Probable 2 mm periophthalmic artery aneurysm on the left. The anterior and middle cerebral vessels are patent without proximal stenosis, aneurysm or vascular malformation. No missing large or medium-sized branch vessels are identified. Both vertebral  arteries are widely patent to the basilar. No basilar stenosis. Posterior circulation branch vessels are normal. MRA NECK FINDINGS Both common carotid arteries are widely patent to the bifurcation. Both carotid bifurcations appear widely patent without flow limiting stenosis. Smooth atherosclerotic plaque is present in the ICA bulb on the left, but narrowing is no more than 30%. Both vertebral arteries are patent with the left being dominant. Possible narrowing of the vertebral artery origins. Limited detail due to breathing motion. IMPRESSION: Areas of acute infarction affecting cortex in the left parietal region consistent with left MCA branch vessel territory infarction. Underlying white matter is largely spared. No swelling or hemorrhage. Mild chronic small-vessel change elsewhere affecting the  cerebral hemispheric white matter. Smooth plaque affecting the ICA bulb on the left with stenosis of 30%. Cannot rule out vertebral artery origin narrowing. Detail is limited because of physiologic chest motion. No intracranial large or medium vessel occlusion identified presently. 2 mm left periophthalmic aneurysm. Electronically Signed   By: Nelson Chimes M.D.   On: 10/04/2019 07:45   MR ANGIO NECK W WO CONTRAST  Result Date: 10/04/2019 CLINICAL DATA:  Right arm weakness and numbness. Negative CT evaluation yesterday. EXAM: MRI HEAD WITHOUT CONTRAST MRA HEAD WITHOUT CONTRAST MRA NECK WITHOUT AND WITH CONTRAST TECHNIQUE: Multiplanar, multiecho pulse sequences of the brain and surrounding structures were obtained without and with intravenous contrast. Angiographic images of the Circle of Willis were obtained using MRA technique without intravenous contrast. Angiographic images of the neck were obtained using MRA technique without and with intravenous contrast. Carotid stenosis measurements (when applicable) are obtained utilizing NASCET criteria, using the distal internal carotid diameter as the denominator. CONTRAST:   10mL GADAVIST GADOBUTROL 1 MMOL/ML IV SOLN COMPARISON:  Head CT 10/03/2019 FINDINGS: MRI HEAD FINDINGS Brain: Diffusion imaging shows scattered acute infarctions affecting the left parietal cortex. No large confluent infarction. No evidence of swelling or hemorrhage. Elsewhere, the brainstem and cerebellum are normal. Cerebral hemispheres show minimal changes of small vessel disease affecting the white matter. No mass, hydrocephalus or extra-axial collection. Vascular: Major vessels at the base of the brain show flow. Skull and upper cervical spine: Negative Sinuses/Orbits: Clear/normal Other: None MRA HEAD FINDINGS Both internal carotid arteries are patent through the skull base and siphon regions. There is mild siphon atherosclerotic irregularity but no flow limiting stenosis. Probable 2 mm periophthalmic artery aneurysm on the left. The anterior and middle cerebral vessels are patent without proximal stenosis, aneurysm or vascular malformation. No missing large or medium-sized branch vessels are identified. Both vertebral arteries are widely patent to the basilar. No basilar stenosis. Posterior circulation branch vessels are normal. MRA NECK FINDINGS Both common carotid arteries are widely patent to the bifurcation. Both carotid bifurcations appear widely patent without flow limiting stenosis. Smooth atherosclerotic plaque is present in the ICA bulb on the left, but narrowing is no more than 30%. Both vertebral arteries are patent with the left being dominant. Possible narrowing of the vertebral artery origins. Limited detail due to breathing motion. IMPRESSION: Areas of acute infarction affecting cortex in the left parietal region consistent with left MCA branch vessel territory infarction. Underlying white matter is largely spared. No swelling or hemorrhage. Mild chronic small-vessel change elsewhere affecting the cerebral hemispheric white matter. Smooth plaque affecting the ICA bulb on the left with stenosis  of 30%. Cannot rule out vertebral artery origin narrowing. Detail is limited because of physiologic chest motion. No intracranial large or medium vessel occlusion identified presently. 2 mm left periophthalmic aneurysm. Electronically Signed   By: Nelson Chimes M.D.   On: 10/04/2019 07:45   MR BRAIN WO CONTRAST  Result Date: 10/04/2019 CLINICAL DATA:  Right arm weakness and numbness. Negative CT evaluation yesterday. EXAM: MRI HEAD WITHOUT CONTRAST MRA HEAD WITHOUT CONTRAST MRA NECK WITHOUT AND WITH CONTRAST TECHNIQUE: Multiplanar, multiecho pulse sequences of the brain and surrounding structures were obtained without and with intravenous contrast. Angiographic images of the Circle of Willis were obtained using MRA technique without intravenous contrast. Angiographic images of the neck were obtained using MRA technique without and with intravenous contrast. Carotid stenosis measurements (when applicable) are obtained utilizing NASCET criteria, using the distal internal carotid diameter as the denominator. CONTRAST:  55mL GADAVIST GADOBUTROL 1 MMOL/ML IV SOLN COMPARISON:  Head CT 10/03/2019 FINDINGS: MRI HEAD FINDINGS Brain: Diffusion imaging shows scattered acute infarctions affecting the left parietal cortex. No large confluent infarction. No evidence of swelling or hemorrhage. Elsewhere, the brainstem and cerebellum are normal. Cerebral hemispheres show minimal changes of small vessel disease affecting the white matter. No mass, hydrocephalus or extra-axial collection. Vascular: Major vessels at the base of the brain show flow. Skull and upper cervical spine: Negative Sinuses/Orbits: Clear/normal Other: None MRA HEAD FINDINGS Both internal carotid arteries are patent through the skull base and siphon regions. There is mild siphon atherosclerotic irregularity but no flow limiting stenosis. Probable 2 mm periophthalmic artery aneurysm on the left. The anterior and middle cerebral vessels are patent without  proximal stenosis, aneurysm or vascular malformation. No missing large or medium-sized branch vessels are identified. Both vertebral arteries are widely patent to the basilar. No basilar stenosis. Posterior circulation branch vessels are normal. MRA NECK FINDINGS Both common carotid arteries are widely patent to the bifurcation. Both carotid bifurcations appear widely patent without flow limiting stenosis. Smooth atherosclerotic plaque is present in the ICA bulb on the left, but narrowing is no more than 30%. Both vertebral arteries are patent with the left being dominant. Possible narrowing of the vertebral artery origins. Limited detail due to breathing motion. IMPRESSION: Areas of acute infarction affecting cortex in the left parietal region consistent with left MCA branch vessel territory infarction. Underlying white matter is largely spared. No swelling or hemorrhage. Mild chronic small-vessel change elsewhere affecting the cerebral hemispheric white matter. Smooth plaque affecting the ICA bulb on the left with stenosis of 30%. Cannot rule out vertebral artery origin narrowing. Detail is limited because of physiologic chest motion. No intracranial large or medium vessel occlusion identified presently. 2 mm left periophthalmic aneurysm. Electronically Signed   By: Nelson Chimes M.D.   On: 10/04/2019 07:45   CT HEAD CODE STROKE WO CONTRAST  Result Date: 10/03/2019 CLINICAL DATA:  Code stroke. Possible stroke, neuro deficit, acute, stroke suspected. Additional history obtained from [place] arm weakness, numbness and neglect, last known normal 10/02/2019 at 23:00 EXAM: CT HEAD WITHOUT CONTRAST TECHNIQUE: Contiguous axial images were obtained from the base of the skull through the vertex without intravenous contrast. COMPARISON:  No pertinent prior studies available for comparison. FINDINGS: Brain: There is mild generalized parenchymal atrophy. Mild ill-defined hypoattenuation within the  cerebral white matter is nonspecific, but consistent with chronic small vessel ischemic disease. There is no acute intracranial hemorrhage. No demarcated cortical infarct is identified. No extra-axial fluid collection. No evidence of intracranial mass. No midline shift. Vascular: No hyperdense vessel.  Atherosclerotic calcifications Skull: Normal. Negative for fracture or focal lesion. Sinuses/Orbits: Visualized orbits show no acute finding. No significant paranasal sinus disease or mastoid effusion at the imaged levels. ASPECTS [facility] Stroke Program Early CT Score) - Ganglionic level infarction (caudate, lentiform nuclei, internal capsule, insula, M1-M3 cortex): 7 - Supraganglionic infarction (M4-M6 cortex): 3 Total score (0-10 with 10 being normal): 10 These results were communicated to Dr. Rory Percy At 10:34 pmon 6/5/2021by text page via the [facility] messaging system. IMPRESSION: 1. No CT evidence of acute intracranial abnormality.  ASPECTS is 10. 2. Mild generalized parenchymal atrophy and chronic small vessel ischemic disease Electronically Signed   By: Kellie Simmering DO   On: 10/03/2019 22:34        Scheduled Meds: . aspirin  325 mg Oral Once  . atorvastatin  80 mg Oral Daily  .  clopidogrel  75 mg Oral Daily  . heparin injection (subcutaneous)  5,000 Units Subcutaneous Q8H  . insulin aspart  0-5 Units Subcutaneous QHS  . insulin aspart  0-9 Units Subcutaneous TID WC   Continuous Infusions:   LOS: 1 day    Time spent: over 30 min    Fayrene Helper, MD [facility]   To contact the attending provider between 7A-7P or the covering provider during after hours 7P-7A, please log into the web site [email or web address] and access using universal Welcome password for that web site. If you do not have the password, please call the hospital operator.  10/05/2019, 12:06 PM

## 2019-10-06 ENCOUNTER — Inpatient Hospital Stay (HOSPITAL_COMMUNITY): Payer: Medicare PPO

## 2019-10-06 DIAGNOSIS — I6389 Other cerebral infarction: Secondary | ICD-10-CM

## 2019-10-06 LAB — COMPREHENSIVE METABOLIC PANEL
ALT: 12 U/L (ref 0–44)
AST: 15 U/L (ref 15–41)
Albumin: 3.4 g/dL — ABNORMAL LOW (ref 3.5–5.0)
Alkaline Phosphatase: 68 U/L (ref 38–126)
Anion gap: 11 (ref 5–15)
BUN: 21 mg/dL (ref 8–23)
CO2: 24 mmol/L (ref 22–32)
Calcium: 9.3 mg/dL (ref 8.9–10.3)
Chloride: 105 mmol/L (ref 98–111)
Creatinine, Ser: 1.36 mg/dL — ABNORMAL HIGH (ref 0.61–1.24)
GFR calc Af Amer: 57 mL/min — ABNORMAL LOW (ref >60)
GFR calc non Af Amer: 49 mL/min — ABNORMAL LOW (ref >60)
Glucose, Bld: 110 mg/dL — ABNORMAL HIGH (ref 70–99)
Potassium: 3.8 mmol/L (ref 3.5–5.1)
Sodium: 140 mmol/L (ref 135–145)
Total Bilirubin: 1 mg/dL (ref 0.3–1.2)
Total Protein: 6.4 g/dL — ABNORMAL LOW (ref 6.5–8.1)

## 2019-10-06 LAB — PHOSPHORUS: Phosphorus: 3 mg/dL (ref 2.5–4.6)

## 2019-10-06 LAB — GLUCOSE, CAPILLARY
Glucose-Capillary: 115 mg/dL — ABNORMAL HIGH (ref 70–99)
Glucose-Capillary: 166 mg/dL — ABNORMAL HIGH (ref 70–99)
Glucose-Capillary: 127 mg/dL — ABNORMAL HIGH (ref 70–99)
Glucose-Capillary: 231 mg/dL — ABNORMAL HIGH (ref 70–99)

## 2019-10-06 LAB — CBC WITH DIFFERENTIAL/PLATELET
Abs Immature Granulocytes: 0.01 10*3/uL (ref 0.00–0.07)
Basophils Absolute: 0 10*3/uL (ref 0.0–0.1)
Basophils Relative: 0 %
Eosinophils Absolute: 0.1 10*3/uL (ref 0.0–0.5)
Eosinophils Relative: 2 %
HCT: 34.1 % — ABNORMAL LOW (ref 39.0–52.0)
Hemoglobin: 11.1 g/dL — ABNORMAL LOW (ref 13.0–17.0)
Immature Granulocytes: 0 %
Lymphocytes Relative: 26 %
Lymphs Abs: 1.4 10*3/uL (ref 0.7–4.0)
MCH: 30.7 pg (ref 26.0–34.0)
MCHC: 32.6 g/dL (ref 30.0–36.0)
MCV: 94.2 fL (ref 80.0–100.0)
Monocytes Absolute: 0.6 10*3/uL (ref 0.1–1.0)
Monocytes Relative: 10 %
Neutro Abs: 3.4 10*3/uL (ref 1.7–7.7)
Neutrophils Relative %: 62 %
Platelets: 176 10*3/uL (ref 150–400)
RBC: 3.62 MIL/uL — ABNORMAL LOW (ref 4.22–5.81)
RDW: 13.3 % (ref 11.5–15.5)
WBC: 5.6 10*3/uL (ref 4.0–10.5)
nRBC: 0 % (ref 0.0–0.2)

## 2019-10-06 LAB — ECHOCARDIOGRAM LIMITED
Height: 70 in
Weight: 4994.74 oz

## 2019-10-06 LAB — MAGNESIUM: Magnesium: 1.8 mg/dL (ref 1.7–2.4)

## 2019-10-06 MED ORDER — CARVEDILOL 12.5 MG PO TABS
12.5000 mg | ORAL_TABLET | Freq: Two times a day (BID) | ORAL | Status: DC
  Administered 2019-10-06 – 2019-10-07 (×2): 12.5 mg via ORAL
  Filled 2019-10-06 (×2): qty 1

## 2019-10-06 MED ORDER — PERFLUTREN LIPID MICROSPHERE
1.0000 mL | INTRAVENOUS | Status: AC | PRN
  Administered 2019-10-06: 2 mL via INTRAVENOUS
  Filled 2019-10-06: qty 10

## 2019-10-06 NOTE — Consult Note (Addendum)
Cardiology Consultation:   Patient ID: Nathan Proctor. MRN: [id number]; DOB: 1940-09-25  Admit date: 10/03/2019 Date of Consult: 10/06/2019  Primary Care Provider: Nolene Ebbs, MD [facility] Cardiologist: [place] Electrophysiologist:  None    Patient Profile:   Nathan Gonnella. is a 79 y.o. male with a hx of type 2 diabetes, CKD stage III, morbid obesity, hypertension, hyperlipidemia who is being seen today for the evaluation of failure at the request of Dr. Florene Glen.  History of Present Illness:   Nathan Proctor not been seen by cardiology in the past.  No known history of heart attack or stent.  Family history positive for MI in his sister and in his father. He lives with his wife and able to care for themselves. He doesn't use a cane or walker. Denies tobacco, alcohol, drug use.   Patient presented to the ED 10/04/19 with right-sided weakness that began when he woke up at 5 AM that morning.  Weakness did not improve so he called EMS.  EMS noted some right-sided neglect as well as hypoglycemia with a blood glucose of 57 and was given D50 in route to the ED.   Work-up in the ED showed creatinine 1.85, BUN 31, blood glucose 66, anemia.  Covid negative.  CT head showed no acute abnormality.  Neurology saw the patient who recommended further stroke work-up.  MRI of the brain showed areas of acute infarction in the cortex in the left parietal region insistent with left MCA branch vessel territory infarction, mild small vessel change, no intracranial large or medium vessel occlusion, 2 mm left periophthalmic aneurysm.  Echo showed EF of 45 to 50% with grade 1 diastolic dysfunction and severe LVH.  Patient was started on Lipitor 80 mg daily and for LDL of 105.  A1c came back at 5.6.  DAPT with aspirin and Plavix for 3 weeks followed by aspirin alone.  Allergy recommending CIR discharge.  She was noted to have recurrent episodes of nonsustained V. tach and cardiology was  consulted before transfer to CIR.    Past Medical History:  Diagnosis Date   Anemia of chronic disease    Ankle fracture 02/15/2016   Cancer ([place])    Prostate   Chronic constipation    Chronic kidney disease    stage III   Diabetes mellitus without complication ([place])    type II    Diabetic peripheral neuropathy ([place])    Failure to thrive (0-17)    Fracture of left lower leg    Gout    Hyperlipidemia    Hypertension    Morbid obesity ([place])    Unstable gait     Past Surgical History:  Procedure Laterality Date   ORIF ANKLE FRACTURE Left 02/29/2016   Procedure: OPEN REDUCTION INTERNAL FIXATION (ORIF) ANKLE FRACTURE;  Surgeon: Nicholes Stairs, MD;  Location: [facility];  Service: Orthopedics;  Laterality: Left;   PROSTATE SURGERY       Home Medications:  Prior to Admission medications   Medication Sig Start Date End Date Taking? Authorizing Provider  acetaminophen (TYLENOL) 500 MG tablet Take 1,000 mg by mouth every 8 (eight) hours as needed for mild pain.   Yes [provider]  allopurinol (ZYLOPRIM) 100 MG tablet Take 100 mg by mouth daily.  01/25/16  Yes [provider]  amLODipine (NORVASC) 10 MG tablet Take 10 mg by mouth daily.    Yes [provider]  atorvastatin (LIPITOR) 40 MG tablet Take 40 mg  by mouth daily.    Yes [provider]  carvedilol (COREG) 25 MG tablet Take 25 mg by mouth daily.    Yes [provider]  gabapentin (NEURONTIN) 300 MG capsule Take 300 mg by mouth 2 (two) times daily.    Yes [provider]  insulin NPH-regular Human (70-30) 100 UNIT/ML injection Inject 30 Units into the skin daily.   Yes [provider]  linaclotide (LINZESS) 145 MCG CAPS capsule Take 145 mcg by mouth daily as needed (constipation.).   Yes [provider]  tiZANidine (ZANAFLEX) 4 MG tablet Take 4 mg by mouth 2 (two) times daily as needed for muscle spasms. 08/13/19  Yes [provider]  Vitamin D,  Ergocalciferol, (DRISDOL) 50000 units CAPS capsule Take 50,000 Units by mouth every 7 (seven) days.    Yes [provider]    Inpatient Medications: Scheduled Meds:  aspirin EC  81 mg Oral Daily   atorvastatin  80 mg Oral Daily   carvedilol  12.5 mg Oral BID WC   clopidogrel  75 mg Oral Daily   heparin injection (subcutaneous)  5,000 Units Subcutaneous Q8H   insulin aspart  0-5 Units Subcutaneous QHS   insulin aspart  0-9 Units Subcutaneous TID WC   Continuous Infusions:   PRN Meds:   Allergies:   No Known Allergies  Social History:   Social History   Socioeconomic History   Marital status: Married    Spouse name: Not on file   Number of children: Not on file   Years of education: Not on file   Highest education level: Not on file  Occupational History   Not on file  Tobacco Use   Smoking status: Former Smoker    Types: Cigarettes    Quit date: 1979    Years since quitting: 42.4   Smokeless tobacco: Never Used  Substance and Sexual Activity   Alcohol use: Yes    Comment: Occasionally   Drug use: No   Sexual activity: Not on file  Other Topics Concern   Not on file  Social History Narrative   Not on file   Social Determinants of Health   Financial Resource Strain:    Difficulty of Paying Living Expenses:   Food Insecurity:    Worried About Charity fundraiser in the Last Year:    Arboriculturist in the Last Year:   Transportation Needs:    Film/video editor (Medical):    Lack of Transportation (Non-Medical):   Physical Activity:    Days of Exercise per Week:    Minutes of Exercise per Session:   Stress:    Feeling of Stress :   Social Connections:    Frequency of Communication with Friends and Family:    Frequency of Social Gatherings with Friends and Family:    Attends Religious Services:    Active Member of Clubs or Organizations:    Attends Music therapist:    Marital Status:   Intimate Partner Violence:    Fear of  Current or Ex-Partner:    Emotionally Abused:    Physically Abused:    Sexually Abused:     Family History:   Family History  Problem Relation Age of Onset   Heart Problems Father    Heart Problems Sister      ROS:  Please see the history of present illness.  All other ROS reviewed and negative.     Physical Exam/Data:   Vitals:  10/05/19 0735 10/05/19 1753 10/05/19 2304 10/06/19 0741  BP: 123/68 (!) 152/66 (!) 155/66 (!) 151/70  Pulse: 72 81 78 75  Resp: 18 18 20 18   Temp: 98.1 F (36.7 C) 97.7 F (36.5 C) 98.5 F (36.9 C) 98.9 F (37.2 C)  TempSrc: Oral  Oral   SpO2: 96% 95% 96% 95%  Weight:      Height:        Intake/Output Summary (Last 24 hours) at 10/06/2019 1354 Last data filed at 10/06/2019 0411 Gross per 24 hour  Intake --  Output 1325 ml  Net -1325 ml   Last 3 Weights 10/04/2019 10/03/2019 04/04/2016  Weight (lbs) 312 lb 2.7 oz 313 lb 15 oz 345 lb  Weight (kg) 141.6 kg 142.4 kg 156.491 kg     Body mass index is 44.79 kg/m.  General:  Well nourished, well developed, in no acute distress HEENT: normal Lymph: no adenopathy Neck: no JVD Endocrine:  No thryomegaly Vascular: No carotid bruits; FA pulses 2+ bilaterally without bruits  Cardiac:  normal S1, S2; RRR; no murmur  Lungs:  clear to auscultation bilaterally, no wheezing, rhonchi or rales  Abd: soft, nontender, no hepatomegaly  Ext: Trace edema Musculoskeletal:  No deformities, BUE and BLE strength normal and equal Skin: warm and dry  Neuro:  CNs 2-12 intact, no focal abnormalities noted Psych:  Normal affect   EKG:  The EKG was personally reviewed and demonstrates:  NSR, PACs, minimal ST depression inferior leads Telemetry:  Telemetry was personally reviewed and demonstrates:  NSR, NSVT, longest 27 beats  Relevant CV Studies:  Echo 10/05/2019   1. Left ventricular ejection fraction, by estimation, is 45 to 50%. The  left ventricle has normal function. The left ventricle demonstrates global   hypokinesis. There is severe left ventricular hypertrophy. Left  ventricular diastolic parameters are  consistent with Grade I diastolic dysfunction (impaired relaxation).   2. Right ventricular systolic function is low normal. The right  ventricular size is normal.   3. The mitral valve is abnormal. Trivial mitral valve regurgitation.   4. The aortic valve is tricuspid. Aortic valve regurgitation is not  visualized. Mild aortic valve sclerosis is present, with no evidence of  aortic valve stenosis.   5. Aortic dilatation noted. There is moderate dilatation at the level of  the sinuses of Valsalva measuring 44 mm.   6. Definity contrast not given due to possible IV infiltration   Laboratory Data:  High Sensitivity Troponin:  No results for input(s): TROPONINIHS in the last 720 hours.   Chemistry Recent Labs  Lab 10/04/19 0531 10/05/19 0243 10/06/19 0356  NA 140 142 140  K 4.0 3.9 3.8  CL 109 109 105  CO2 24 25 24   GLUCOSE 251* 101* 110*  BUN 30* 26* 21  CREATININE 1.59* 1.50* 1.36*  CALCIUM 9.1 9.2 9.3  GFRNONAA 40* 43* 49*  GFRAA 47* 50* 57*  ANIONGAP 7 8 11     Recent Labs  Lab 10/04/19 0531 10/05/19 0243 10/06/19 0356  PROT 6.1* 6.3* 6.4*  ALBUMIN 3.2* 3.4* 3.4*  AST 16 14* 15  ALT 12 11 12   ALKPHOS 68 71 68  BILITOT 0.8 0.8 1.0   Hematology Recent Labs  Lab 10/04/19 0531 10/05/19 0243 10/06/19 0356  WBC 5.4 5.4 5.6  RBC 3.59* 3.57* 3.62*  HGB 10.9* 11.0* 11.1*  HCT 35.0* 34.5* 34.1*  MCV 97.5 96.6 94.2  MCH 30.4 30.8 30.7  MCHC 31.1 31.9 32.6  RDW 13.2 13.2 13.3  PLT 167 184 176   BNPNo results for input(s): BNP, PROBNP in the last 168 hours.  DDimer No results for input(s): DDIMER in the last 168 hours.   Radiology/Studies:  MR ANGIO HEAD WO CONTRAST  Result Date: 10/04/2019 CLINICAL DATA:  Right arm weakness and numbness. Negative CT evaluation yesterday. EXAM: MRI HEAD WITHOUT CONTRAST MRA HEAD WITHOUT CONTRAST MRA NECK WITHOUT AND WITH  CONTRAST TECHNIQUE: Multiplanar, multiecho pulse sequences of the brain and surrounding structures were obtained without and with intravenous contrast. Angiographic images of the Circle of Willis were obtained using MRA technique without intravenous contrast. Angiographic images of the neck were obtained using MRA technique without and with intravenous contrast. Carotid stenosis measurements (when applicable) are obtained utilizing NASCET criteria, using the distal internal carotid diameter as the denominator. CONTRAST:  73mL GADAVIST GADOBUTROL 1 MMOL/ML IV SOLN COMPARISON:  Head CT 10/03/2019 FINDINGS: MRI HEAD FINDINGS Brain: Diffusion imaging shows scattered acute infarctions affecting the left parietal cortex. No large confluent infarction. No evidence of swelling or hemorrhage. Elsewhere, the brainstem and cerebellum are normal. Cerebral hemispheres show minimal changes of small vessel disease affecting the white matter. No mass, hydrocephalus or extra-axial collection. Vascular: Major vessels at the base of the brain show flow. Skull and upper cervical spine: Negative Sinuses/Orbits: Clear/normal Other: None MRA HEAD FINDINGS Both internal carotid arteries are patent through the skull base and siphon regions. There is mild siphon atherosclerotic irregularity but no flow limiting stenosis. Probable 2 mm periophthalmic artery aneurysm on the left. The anterior and middle cerebral vessels are patent without proximal stenosis, aneurysm or vascular malformation. No missing large or medium-sized branch vessels are identified. Both vertebral arteries are widely patent to the basilar. No basilar stenosis. Posterior circulation branch vessels are normal. MRA NECK FINDINGS Both common carotid arteries are widely patent to the bifurcation. Both carotid bifurcations appear widely patent without flow limiting stenosis. Smooth atherosclerotic plaque is present in the ICA bulb on the left, but narrowing is no more than 30%.  Both vertebral arteries are patent with the left being dominant. Possible narrowing of the vertebral artery origins. Limited detail due to breathing motion. IMPRESSION: Areas of acute infarction affecting cortex in the left parietal region consistent with left MCA branch vessel territory infarction. Underlying white matter is largely spared. No swelling or hemorrhage. Mild chronic small-vessel change elsewhere affecting the cerebral hemispheric white matter. Smooth plaque affecting the ICA bulb on the left with stenosis of 30%. Cannot rule out vertebral artery origin narrowing. Detail is limited because of physiologic chest motion. No intracranial large or medium vessel occlusion identified presently. 2 mm left periophthalmic aneurysm. Electronically Signed   By: Nelson Chimes M.D.   On: 10/04/2019 07:45   MR ANGIO NECK W WO CONTRAST  Result Date: 10/04/2019 CLINICAL DATA:  Right arm weakness and numbness. Negative CT evaluation yesterday. EXAM: MRI HEAD WITHOUT CONTRAST MRA HEAD WITHOUT CONTRAST MRA NECK WITHOUT AND WITH CONTRAST TECHNIQUE: Multiplanar, multiecho pulse sequences of the brain and surrounding structures were obtained without and with intravenous contrast. Angiographic images of the Circle of Willis were obtained using MRA technique without intravenous contrast. Angiographic images of the neck were obtained using MRA technique without and with intravenous contrast. Carotid stenosis measurements (when applicable) are obtained utilizing NASCET criteria, using the distal internal carotid diameter as the denominator. CONTRAST:  65mL GADAVIST GADOBUTROL 1 MMOL/ML IV SOLN COMPARISON:  Head CT 10/03/2019 FINDINGS: MRI HEAD FINDINGS Brain: Diffusion imaging shows scattered acute infarctions affecting the left parietal  cortex. No large confluent infarction. No evidence of swelling or hemorrhage. Elsewhere, the brainstem and cerebellum are normal. Cerebral hemispheres show minimal changes of small vessel  disease affecting the white matter. No mass, hydrocephalus or extra-axial collection. Vascular: Major vessels at the base of the brain show flow. Skull and upper cervical spine: Negative Sinuses/Orbits: Clear/normal Other: None MRA HEAD FINDINGS Both internal carotid arteries are patent through the skull base and siphon regions. There is mild siphon atherosclerotic irregularity but no flow limiting stenosis. Probable 2 mm periophthalmic artery aneurysm on the left. The anterior and middle cerebral vessels are patent without proximal stenosis, aneurysm or vascular malformation. No missing large or medium-sized branch vessels are identified. Both vertebral arteries are widely patent to the basilar. No basilar stenosis. Posterior circulation branch vessels are normal. MRA NECK FINDINGS Both common carotid arteries are widely patent to the bifurcation. Both carotid bifurcations appear widely patent without flow limiting stenosis. Smooth atherosclerotic plaque is present in the ICA bulb on the left, but narrowing is no more than 30%. Both vertebral arteries are patent with the left being dominant. Possible narrowing of the vertebral artery origins. Limited detail due to breathing motion. IMPRESSION: Areas of acute infarction affecting cortex in the left parietal region consistent with left MCA branch vessel territory infarction. Underlying white matter is largely spared. No swelling or hemorrhage. Mild chronic small-vessel change elsewhere affecting the cerebral hemispheric white matter. Smooth plaque affecting the ICA bulb on the left with stenosis of 30%. Cannot rule out vertebral artery origin narrowing. Detail is limited because of physiologic chest motion. No intracranial large or medium vessel occlusion identified presently. 2 mm left periophthalmic aneurysm. Electronically Signed   By: Nelson Chimes M.D.   On: 10/04/2019 07:45   MR BRAIN WO CONTRAST  Result Date: 10/04/2019 CLINICAL DATA:  Right arm weakness and  numbness. Negative CT evaluation yesterday. EXAM: MRI HEAD WITHOUT CONTRAST MRA HEAD WITHOUT CONTRAST MRA NECK WITHOUT AND WITH CONTRAST TECHNIQUE: Multiplanar, multiecho pulse sequences of the brain and surrounding structures were obtained without and with intravenous contrast. Angiographic images of the Circle of Willis were obtained using MRA technique without intravenous contrast. Angiographic images of the neck were obtained using MRA technique without and with intravenous contrast. Carotid stenosis measurements (when applicable) are obtained utilizing NASCET criteria, using the distal internal carotid diameter as the denominator. CONTRAST:  34mL GADAVIST GADOBUTROL 1 MMOL/ML IV SOLN COMPARISON:  Head CT 10/03/2019 FINDINGS: MRI HEAD FINDINGS Brain: Diffusion imaging shows scattered acute infarctions affecting the left parietal cortex. No large confluent infarction. No evidence of swelling or hemorrhage. Elsewhere, the brainstem and cerebellum are normal. Cerebral hemispheres show minimal changes of small vessel disease affecting the white matter. No mass, hydrocephalus or extra-axial collection. Vascular: Major vessels at the base of the brain show flow. Skull and upper cervical spine: Negative Sinuses/Orbits: Clear/normal Other: None MRA HEAD FINDINGS Both internal carotid arteries are patent through the skull base and siphon regions. There is mild siphon atherosclerotic irregularity but no flow limiting stenosis. Probable 2 mm periophthalmic artery aneurysm on the left. The anterior and middle cerebral vessels are patent without proximal stenosis, aneurysm or vascular malformation. No missing large or medium-sized branch vessels are identified. Both vertebral arteries are widely patent to the basilar. No basilar stenosis. Posterior circulation branch vessels are normal. MRA NECK FINDINGS Both common carotid arteries are widely patent to the bifurcation. Both carotid bifurcations appear widely patent without  flow limiting stenosis. Smooth atherosclerotic plaque is present in the  ICA bulb on the left, but narrowing is no more than 30%. Both vertebral arteries are patent with the left being dominant. Possible narrowing of the vertebral artery origins. Limited detail due to breathing motion. IMPRESSION: Areas of acute infarction affecting cortex in the left parietal region consistent with left MCA branch vessel territory infarction. Underlying white matter is largely spared. No swelling or hemorrhage. Mild chronic small-vessel change elsewhere affecting the cerebral hemispheric white matter. Smooth plaque affecting the ICA bulb on the left with stenosis of 30%. Cannot rule out vertebral artery origin narrowing. Detail is limited because of physiologic chest motion. No intracranial large or medium vessel occlusion identified presently. 2 mm left periophthalmic aneurysm. Electronically Signed   By: Nelson Chimes M.D.   On: 10/04/2019 07:45   ECHOCARDIOGRAM COMPLETE  Result Date: 10/05/2019    ECHOCARDIOGRAM REPORT   Patient Name:   Nathan Rosas. Date of Exam: 10/05/2019 Medical Rec #:  [id number]              Height:       70.0 in Accession #:    [id number]             Weight:       312.2 lb Date of Birth:  20-Mar-1941              BSA:          2.522 m Patient Age:    83 years               BP:           123/68 mmHg Patient Gender: M                      HR:           72 bpm. Exam Location:  Inpatient Procedure: 2D Echo and Cardiac Doppler Indications:    Stroke  History:        Patient has no prior history of Echocardiogram examinations.                 Risk Factors:Hypertension, Dyslipidemia and Former Smoker. CKD.  Sonographer:    Clayton Lefort RDCS (AE) Referring Phys: [id number] [facility] ADEFESO  Sonographer Comments: Definity use attempted. Patient reported pain at IV site with saline flush. No furthuer attempts made to use Definity. Nurse notified of patient reaction. IMPRESSIONS  1. Left ventricular ejection  fraction, by estimation, is 45 to 50%. The left ventricle has normal function. The left ventricle demonstrates global hypokinesis. There is severe left ventricular hypertrophy. Left ventricular diastolic parameters are consistent with Grade I diastolic dysfunction (impaired relaxation).  2. Right ventricular systolic function is low normal. The right ventricular size is normal.  3. The mitral valve is abnormal. Trivial mitral valve regurgitation.  4. The aortic valve is tricuspid. Aortic valve regurgitation is not visualized. Mild aortic valve sclerosis is present, with no evidence of aortic valve stenosis.  5. Aortic dilatation noted. There is moderate dilatation at the level of the sinuses of Valsalva measuring 44 mm.  6. Definity contrast not given due to possible IV infiltration FINDINGS  Left Ventricle: Left ventricular ejection fraction, by estimation, is 45 to 50%. The left ventricle has normal function. The left ventricle demonstrates global hypokinesis. The left ventricular internal cavity size was normal in size. There is severe left ventricular hypertrophy. Left ventricular diastolic parameters are consistent with Grade I diastolic dysfunction (impaired relaxation). Indeterminate filling pressures. Right Ventricle: The right  ventricular size is normal. No increase in right ventricular wall thickness. Right ventricular systolic function is low normal. Left Atrium: Left atrial size was normal in size. Right Atrium: Right atrial size was normal in size. Pericardium: There is no evidence of pericardial effusion. Mitral Valve: The mitral valve is abnormal. There is mild thickening of the mitral valve leaflet(s). Mild to moderate mitral annular calcification. Trivial mitral valve regurgitation. MV peak gradient, 9.1 mmHg. The mean mitral valve gradient is 2.0 mmHg. Tricuspid Valve: The tricuspid valve is grossly normal. Tricuspid valve regurgitation is trivial. Aortic Valve: The aortic valve is tricuspid.  Aortic valve regurgitation is not visualized. Mild aortic valve sclerosis is present, with no evidence of aortic valve stenosis. Aortic valve mean gradient measures 3.0 mmHg. Aortic valve peak gradient measures 5.4 mmHg. Aortic valve area, by VTI measures 2.70 cm. Pulmonic Valve: The pulmonic valve was normal in structure. Pulmonic valve regurgitation is not visualized. Aorta: Aortic dilatation noted. There is moderate dilatation at the level of the sinuses of Valsalva measuring 44 mm. Venous: The inferior vena cava was not well visualized. IAS/Shunts: No atrial level shunt detected by color flow Doppler.  LEFT VENTRICLE PLAX 2D LVIDd:         4.33 cm  Diastology LVIDs:         3.33 cm  LV e' lateral:   5.56 cm/s LV PW:         2.29 cm  LV E/e' lateral: 12.2 LV IVS:        1.80 cm  LV e' medial:    3.75 cm/s LVOT diam:     2.50 cm  LV E/e' medial:  18.1 LV SV:         68 LV SV Index:   27 LVOT Area:     4.91 cm  RIGHT VENTRICLE RV S prime:     10.60 cm/s TAPSE (M-mode): 2.1 cm LEFT ATRIUM             Index       RIGHT ATRIUM           Index LA diam:        3.00 cm 1.19 cm/m  RA Area:     16.90 cm LA Vol (A2C):   95.8 ml 37.98 ml/m RA Volume:   40.20 ml  15.94 ml/m LA Vol (A4C):   64.6 ml 25.61 ml/m LA Biplane Vol: 82.0 ml 32.51 ml/m  AORTIC VALVE AV Area (Vmax):    3.45 cm AV Area (Vmean):   3.53 cm AV Area (VTI):     2.70 cm AV Vmax:           116.00 cm/s AV Vmean:          74.400 cm/s AV VTI:            0.253 m AV Peak Grad:      5.4 mmHg AV Mean Grad:      3.0 mmHg LVOT Vmax:         81.50 cm/s LVOT Vmean:        53.500 cm/s LVOT VTI:          0.139 m LVOT/AV VTI ratio: 0.55  AORTA Ao Root diam: 4.40 cm MITRAL VALVE MV Area (PHT): 3.30 cm     SHUNTS MV Peak grad:  9.1 mmHg     Systemic VTI:  0.14 m MV Mean grad:  2.0 mmHg     Systemic Diam: 2.50 cm MV Vmax:  1.51 m/s MV Vmean:      63.9 cm/s MV Decel Time: 230 msec MV E velocity: 67.70 cm/s MV A velocity: 127.00 cm/s MV E/A ratio:  0.53 Lyman Bishop MD Electronically signed by Lyman Bishop MD Signature Date/Time: 10/05/2019/4:11:54 PM    Final    CT HEAD CODE STROKE WO CONTRAST  Result Date: 10/03/2019 CLINICAL DATA:  Code stroke. Possible stroke, neuro deficit, acute, stroke suspected. Additional history obtained from [place] arm weakness, numbness and neglect, last known normal 10/02/2019 at 23:00 EXAM: CT HEAD WITHOUT CONTRAST TECHNIQUE: Contiguous axial images were obtained from the base of the skull through the vertex without intravenous contrast. COMPARISON:  No pertinent prior studies available for comparison. FINDINGS: Brain: There is mild generalized parenchymal atrophy. Mild ill-defined hypoattenuation within the cerebral white matter is nonspecific, but consistent with chronic small vessel ischemic disease. There is no acute intracranial hemorrhage. No demarcated cortical infarct is identified. No extra-axial fluid collection. No evidence of intracranial mass. No midline shift. Vascular: No hyperdense vessel.  Atherosclerotic calcifications Skull: Normal. Negative for fracture or focal lesion. Sinuses/Orbits: Visualized orbits show no acute finding. No significant paranasal sinus disease or mastoid effusion at the imaged levels. ASPECTS [facility] Stroke Program Early CT Score) - Ganglionic level infarction (caudate, lentiform nuclei, internal capsule, insula, M1-M3 cortex): 7 - Supraganglionic infarction (M4-M6 cortex): 3 Total score (0-10 with 10 being normal): 10 These results were communicated to Dr. Rory Percy At 10:34 pmon 6/5/2021by text page via the [facility] messaging system. IMPRESSION: 1. No CT evidence of acute intracranial abnormality.  ASPECTS is 10. 2. Mild generalized parenchymal atrophy and chronic small vessel ischemic disease Electronically Signed   By: Kellie Simmering DO   On: 10/03/2019 22:34     Assessment and Plan:  Diastolic heart failure/Cardiomyopathy -Echo showed EF 45 to 50%, global hypokinesis,  severe LVH, grade 1 diastolic dysfunction, normal RV function, trivial MR, mild aortic valve sclerosis, aortic dilatation to 57mm -EF is mildly reduced with no prior echoes for comparison -Patient has no significant cardiac history -RF for CAD/ischemia or hypertension hyperlipidemia, diabetes and obesity -CT in 03/2016 showed thoracic aortic atherosclerosis and coronary artery calcifications -EKG shows sinus rhythm with PACs and minimal ST depression in inferior leads however relatively unchanged from prior. -Denies chest pain or dyspnea. Reported occasional LLE, no orthopnea -Patient will likely require ischemic evaluation however would wait until patient is more stable. Can likely be OP - MD to see  NSVT - 27 beats NSVT on tele. Patient was asymptomatic -Agree with Coreg 12.5 mg twice daily. Can increase as needed  Hypertension -Coreg as above - can possibly add ACE/ARB if creatinine is stable  Hyperlipidemia -Atorvastatin 80 mg  Acute stroke -Plan for CIR discharge - still has some right sided weakness -Per IM/neurology -DAPT with aspirin and Plavix for 3 weeks   For questions or updates, please contact [facility] Please consult [email or web address] for contact info under    Signed, Cadence Ninfa Meeker, PA-C  10/06/2019 1:54 PM  Pt seen and examined   See my accompanying consult note Will continue to follow

## 2019-10-06 NOTE — Progress Notes (Signed)
Pt had 27 beats of V-tech this morning MD was notified and new orders were given. This also delayed his move to CIR will remain on 2W for further observation and test.

## 2019-10-06 NOTE — Progress Notes (Addendum)
Inpatient Rehabilitation Admissions Coordinator  I have insurance approval to admit to CIR today. I met with patient at bedside an notified wife by phone. I will contact Dr. Florene Glen and [facility] team and arrange admit.  Danne Baxter, RN, MSN Rehab Admissions Coordinator [phone] 10/06/2019 12:58 PM   Admit on hold per Dr. Florene Glen for Cardiology consulted.

## 2019-10-06 NOTE — Progress Notes (Signed)
*  PRELIMINARY RESULTS* Echocardiogram 2D Echocardiogram with definity has been performed.  Leavy Cella 10/06/2019, 4:18 PM

## 2019-10-06 NOTE — Consult Note (Signed)
Cardiology Consultation:   Patient ID: Nathan Proctor. MRN: [id number]; DOB: [birth year]  Admit date: 10/03/2019 Date of Consult: 10/06/2019  Primary Care Provider: Nolene Ebbs, MD [facility] Cardiologist: New    Patient Profile:   Nathan Knoth. is a 79 y.o. male with a hx of CVA who is being seen today for the evaluation of PVCs, NSVT  at the request of Dr Florene Glen    History of Present Illness:   Nathan Proctor is an 79 yo with hstory of DM, CKD, morbid obesity, HTN, HL and diabetic retinopathy.  Admitted on 6/6 with R arm weaknessR sided neglect   Found to have acute CVA   Started on DAPT and statin  Pt noted to have recurrent VT on monitor    Echo dones showed LVEF 45 to 50%  The pt denies CP   Denies SOB   Occasional dizziness  No palpitations  No syncope   Past Medical History:  Diagnosis Date  . Anemia of chronic disease   . Ankle fracture 02/15/2016  . Cancer [facility])    Prostate  . Chronic constipation   . Chronic kidney disease    stage III  . Diabetes mellitus without complication ([place])    type II   . Diabetic peripheral neuropathy ([place])   . Failure to thrive (0-17)   . Fracture of left lower leg   . Gout   . Hyperlipidemia   . Hypertension   . Morbid obesity ([place])   . Unstable gait     Past Surgical History:  Procedure Laterality Date  . ORIF ANKLE FRACTURE Left 02/29/2016   Procedure: OPEN REDUCTION INTERNAL FIXATION (ORIF) ANKLE FRACTURE;  Surgeon: Nicholes Stairs, MD;  Location: [facility];  Service: Orthopedics;  Laterality: Left;  . PROSTATE SURGERY         Inpatient Medications: Scheduled Meds: . aspirin EC  81 mg Oral Daily  . atorvastatin  80 mg Oral Daily  . carvedilol  12.5 mg Oral BID WC  . clopidogrel  75 mg Oral Daily  . heparin injection (subcutaneous)  5,000 Units Subcutaneous Q8H  . insulin aspart  0-5 Units Subcutaneous QHS  . insulin aspart  0-9 Units Subcutaneous TID WC   Continuous Infusions:  PRN  Meds:   Allergies:   No Known Allergies  Social History:   Social History   Socioeconomic History  . Marital status: Married    Spouse name: Not on file  . Number of children: Not on file  . Years of education: Not on file  . Highest education level: Not on file  Occupational History  . Not on file  Tobacco Use  . Smoking status: Former Smoker    Types: Cigarettes    Quit date: 1979    Years since quitting: 42.4  . Smokeless tobacco: Never Used  Substance and Sexual Activity  . Alcohol use: Yes    Comment: Occasionally  . Drug use: No  . Sexual activity: Not on file  Other Topics Concern  . Not on file  Social History Narrative  . Not on file   Social Determinants of Health   Financial Resource Strain:   . Difficulty of Paying Living Expenses:   Food Insecurity:   . Worried About Charity fundraiser in the Last Year:   . Arboriculturist in the Last Year:   Transportation Needs:   . Film/video editor (Medical):   Marland Kitchen Lack of Transportation (Non-Medical):  Physical Activity:   . Days of Exercise per Week:   . Minutes of Exercise per Session:   Stress:   . Feeling of Stress :   Social Connections:   . Frequency of Communication with Friends and Family:   . Frequency of Social Gatherings with Friends and Family:   . Attends Religious Services:   . Active Member of Clubs or Organizations:   . Attends Archivist Meetings:   Marland Kitchen Marital Status:   Intimate Partner Violence:   . Fear of Current or Ex-Partner:   . Emotionally Abused:   Marland Kitchen Physically Abused:   . Sexually Abused:     Family History:    Family History  Problem Relation Age of Onset  . Heart Problems Father   . Heart Problems Sister      ROS:  Please see the history of present illness.   All other ROS reviewed and negative.     Physical Exam/Data:   Vitals:   10/05/19 0735 10/05/19 1753 10/05/19 2304 10/06/19 0741  BP: 123/68 (!) 152/66 (!) 155/66 (!) 151/70  Pulse: 72 81 78  75  Resp: 18 18 20 18   Temp: 98.1 F (36.7 C) 97.7 F (36.5 C) 98.5 F (36.9 C) 98.9 F (37.2 C)  TempSrc: Oral  Oral   SpO2: 96% 95% 96% 95%  Weight:      Height:        Intake/Output Summary (Last 24 hours) at 10/06/2019 1355 Last data filed at 10/06/2019 0411 Gross per 24 hour  Intake --  Output 1325 ml  Net -1325 ml   Last 3 Weights 10/04/2019 10/03/2019 04/04/2016  Weight (lbs) 312 lb 2.7 oz 313 lb 15 oz 345 lb  Weight (kg) 141.6 kg 142.4 kg 156.491 kg     Body mass index is 44.79 kg/m.  General:  Well nourished, well developed, in no acute distress HEENT: normal Lymph: no adenopathy Neck: Neck full  No obvious JVD   No bruits   Endocrine:  No thryomegaly Vascular: No carotid bruits; FA pulses 2+ bilaterally without bruits  Cardiac:  normal S1, S2; RRR; no murmur  Lungs:  clear to auscultation bilaterally, no wheezing, rhonchi or rales  Abd: soft, nontender, no hepatomegaly  Ext: no LE edema Musculoskeletal:  No deformities, BUE and BLE strength normal and equal Skin: warm and dry Chronic stasis changes legs  Neuro:  CNs 2-12 intact  Otherwise deferred   Psych:  Normal affect   EKG:  The EKG was personally reviewed and demonstrates:  SR 68 bpm   Occasional PAC   Nonspecific ST changes (10/03/19) Telemetry:  Telemetry was personally reviewed and demonstrates:  SR   Occasional NSVT (3 spells, longest 21 beats)    Relevant CV Studies: Echo 6/7  1. Left ventricular ejection fraction, by estimation, is 45 to 50%. The left ventricle has normal function. The left ventricle demonstrates global hypokinesis. There is severe left ventricular hypertrophy. Left ventricular diastolic parameters are consistent with Grade I diastolic dysfunction (impaired relaxation). 2. Right ventricular systolic function is low normal. The right ventricular size is normal. 3. The mitral valve is abnormal. Trivial mitral valve regurgitation. 4. The aortic valve is tricuspid. Aortic valve regurgitation  is not visualized. Mild aortic valve sclerosis is present, with no evidence of aortic valve stenosis. 5. Aortic dilatation noted. There is moderate dilatation at the level of the sinuses of Valsalva measuring 44 mm. 6. Definity contrast not given due to possible IV infiltration  Laboratory Data:  High Sensitivity Troponin:  No results for input(s): TROPONINIHS in the last 720 hours.   Chemistry Recent Labs  Lab 10/04/19 0531 10/05/19 0243 10/06/19 0356  NA 140 142 140  K 4.0 3.9 3.8  CL 109 109 105  CO2 24 25 24   GLUCOSE 251* 101* 110*  BUN 30* 26* 21  CREATININE 1.59* 1.50* 1.36*  CALCIUM 9.1 9.2 9.3  GFRNONAA 40* 43* 49*  GFRAA 47* 50* 57*  ANIONGAP 7 8 11     Recent Labs  Lab 10/04/19 0531 10/05/19 0243 10/06/19 0356  PROT 6.1* 6.3* 6.4*  ALBUMIN 3.2* 3.4* 3.4*  AST 16 14* 15  ALT 12 11 12   ALKPHOS 68 71 68  BILITOT 0.8 0.8 1.0   Hematology Recent Labs  Lab 10/04/19 0531 10/05/19 0243 10/06/19 0356  WBC 5.4 5.4 5.6  RBC 3.59* 3.57* 3.62*  HGB 10.9* 11.0* 11.1*  HCT 35.0* 34.5* 34.1*  MCV 97.5 96.6 94.2  MCH 30.4 30.8 30.7  MCHC 31.1 31.9 32.6  RDW 13.2 13.2 13.3  PLT 167 184 176   BNPNo results for input(s): BNP, PROBNP in the last 168 hours.  DDimer No results for input(s): DDIMER in the last 168 hours.   Radiology/Studies:  MR ANGIO HEAD WO CONTRAST  Result Date: 10/04/2019 CLINICAL DATA:  Right arm weakness and numbness. Negative CT evaluation yesterday. EXAM: MRI HEAD WITHOUT CONTRAST MRA HEAD WITHOUT CONTRAST MRA NECK WITHOUT AND WITH CONTRAST TECHNIQUE: Multiplanar, multiecho pulse sequences of the brain and surrounding structures were obtained without and with intravenous contrast. Angiographic images of the Circle of Willis were obtained using MRA technique without intravenous contrast. Angiographic images of the neck were obtained using MRA technique without and with intravenous contrast. Carotid stenosis measurements (when applicable) are  obtained utilizing NASCET criteria, using the distal internal carotid diameter as the denominator. CONTRAST:  4mL GADAVIST GADOBUTROL 1 MMOL/ML IV SOLN COMPARISON:  Head CT 10/03/2019 FINDINGS: MRI HEAD FINDINGS Brain: Diffusion imaging shows scattered acute infarctions affecting the left parietal cortex. No large confluent infarction. No evidence of swelling or hemorrhage. Elsewhere, the brainstem and cerebellum are normal. Cerebral hemispheres show minimal changes of small vessel disease affecting the white matter. No mass, hydrocephalus or extra-axial collection. Vascular: Major vessels at the base of the brain show flow. Skull and upper cervical spine: Negative Sinuses/Orbits: Clear/normal Other: None MRA HEAD FINDINGS Both internal carotid arteries are patent through the skull base and siphon regions. There is mild siphon atherosclerotic irregularity but no flow limiting stenosis. Probable 2 mm periophthalmic artery aneurysm on the left. The anterior and middle cerebral vessels are patent without proximal stenosis, aneurysm or vascular malformation. No missing large or medium-sized branch vessels are identified. Both vertebral arteries are widely patent to the basilar. No basilar stenosis. Posterior circulation branch vessels are normal. MRA NECK FINDINGS Both common carotid arteries are widely patent to the bifurcation. Both carotid bifurcations appear widely patent without flow limiting stenosis. Smooth atherosclerotic plaque is present in the ICA bulb on the left, but narrowing is no more than 30%. Both vertebral arteries are patent with the left being dominant. Possible narrowing of the vertebral artery origins. Limited detail due to breathing motion. IMPRESSION: Areas of acute infarction affecting cortex in the left parietal region consistent with left MCA branch vessel territory infarction. Underlying white matter is largely spared. No swelling or hemorrhage. Mild chronic small-vessel change elsewhere  affecting the cerebral hemispheric white matter. Smooth plaque affecting the ICA bulb on the left with stenosis of  30%. Cannot rule out vertebral artery origin narrowing. Detail is limited because of physiologic chest motion. No intracranial large or medium vessel occlusion identified presently. 2 mm left periophthalmic aneurysm. Electronically Signed   By: Nelson Chimes M.D.   On: 10/04/2019 07:45   MR ANGIO NECK W WO CONTRAST  Result Date: 10/04/2019 CLINICAL DATA:  Right arm weakness and numbness. Negative CT evaluation yesterday. EXAM: MRI HEAD WITHOUT CONTRAST MRA HEAD WITHOUT CONTRAST MRA NECK WITHOUT AND WITH CONTRAST TECHNIQUE: Multiplanar, multiecho pulse sequences of the brain and surrounding structures were obtained without and with intravenous contrast. Angiographic images of the Circle of Willis were obtained using MRA technique without intravenous contrast. Angiographic images of the neck were obtained using MRA technique without and with intravenous contrast. Carotid stenosis measurements (when applicable) are obtained utilizing NASCET criteria, using the distal internal carotid diameter as the denominator. CONTRAST:  72mL GADAVIST GADOBUTROL 1 MMOL/ML IV SOLN COMPARISON:  Head CT 10/03/2019 FINDINGS: MRI HEAD FINDINGS Brain: Diffusion imaging shows scattered acute infarctions affecting the left parietal cortex. No large confluent infarction. No evidence of swelling or hemorrhage. Elsewhere, the brainstem and cerebellum are normal. Cerebral hemispheres show minimal changes of small vessel disease affecting the white matter. No mass, hydrocephalus or extra-axial collection. Vascular: Major vessels at the base of the brain show flow. Skull and upper cervical spine: Negative Sinuses/Orbits: Clear/normal Other: None MRA HEAD FINDINGS Both internal carotid arteries are patent through the skull base and siphon regions. There is mild siphon atherosclerotic irregularity but no flow limiting stenosis.  Probable 2 mm periophthalmic artery aneurysm on the left. The anterior and middle cerebral vessels are patent without proximal stenosis, aneurysm or vascular malformation. No missing large or medium-sized branch vessels are identified. Both vertebral arteries are widely patent to the basilar. No basilar stenosis. Posterior circulation branch vessels are normal. MRA NECK FINDINGS Both common carotid arteries are widely patent to the bifurcation. Both carotid bifurcations appear widely patent without flow limiting stenosis. Smooth atherosclerotic plaque is present in the ICA bulb on the left, but narrowing is no more than 30%. Both vertebral arteries are patent with the left being dominant. Possible narrowing of the vertebral artery origins. Limited detail due to breathing motion. IMPRESSION: Areas of acute infarction affecting cortex in the left parietal region consistent with left MCA branch vessel territory infarction. Underlying white matter is largely spared. No swelling or hemorrhage. Mild chronic small-vessel change elsewhere affecting the cerebral hemispheric white matter. Smooth plaque affecting the ICA bulb on the left with stenosis of 30%. Cannot rule out vertebral artery origin narrowing. Detail is limited because of physiologic chest motion. No intracranial large or medium vessel occlusion identified presently. 2 mm left periophthalmic aneurysm. Electronically Signed   By: Nelson Chimes M.D.   On: 10/04/2019 07:45   MR BRAIN WO CONTRAST  Result Date: 10/04/2019 CLINICAL DATA:  Right arm weakness and numbness. Negative CT evaluation yesterday. EXAM: MRI HEAD WITHOUT CONTRAST MRA HEAD WITHOUT CONTRAST MRA NECK WITHOUT AND WITH CONTRAST TECHNIQUE: Multiplanar, multiecho pulse sequences of the brain and surrounding structures were obtained without and with intravenous contrast. Angiographic images of the Circle of Willis were obtained using MRA technique without intravenous contrast. Angiographic images of  the neck were obtained using MRA technique without and with intravenous contrast. Carotid stenosis measurements (when applicable) are obtained utilizing NASCET criteria, using the distal internal carotid diameter as the denominator. CONTRAST:  89mL GADAVIST GADOBUTROL 1 MMOL/ML IV SOLN COMPARISON:  Head CT 10/03/2019 FINDINGS: MRI HEAD  FINDINGS Brain: Diffusion imaging shows scattered acute infarctions affecting the left parietal cortex. No large confluent infarction. No evidence of swelling or hemorrhage. Elsewhere, the brainstem and cerebellum are normal. Cerebral hemispheres show minimal changes of small vessel disease affecting the white matter. No mass, hydrocephalus or extra-axial collection. Vascular: Major vessels at the base of the brain show flow. Skull and upper cervical spine: Negative Sinuses/Orbits: Clear/normal Other: None MRA HEAD FINDINGS Both internal carotid arteries are patent through the skull base and siphon regions. There is mild siphon atherosclerotic irregularity but no flow limiting stenosis. Probable 2 mm periophthalmic artery aneurysm on the left. The anterior and middle cerebral vessels are patent without proximal stenosis, aneurysm or vascular malformation. No missing large or medium-sized branch vessels are identified. Both vertebral arteries are widely patent to the basilar. No basilar stenosis. Posterior circulation branch vessels are normal. MRA NECK FINDINGS Both common carotid arteries are widely patent to the bifurcation. Both carotid bifurcations appear widely patent without flow limiting stenosis. Smooth atherosclerotic plaque is present in the ICA bulb on the left, but narrowing is no more than 30%. Both vertebral arteries are patent with the left being dominant. Possible narrowing of the vertebral artery origins. Limited detail due to breathing motion. IMPRESSION: Areas of acute infarction affecting cortex in the left parietal region consistent with left MCA branch vessel  territory infarction. Underlying white matter is largely spared. No swelling or hemorrhage. Mild chronic small-vessel change elsewhere affecting the cerebral hemispheric white matter. Smooth plaque affecting the ICA bulb on the left with stenosis of 30%. Cannot rule out vertebral artery origin narrowing. Detail is limited because of physiologic chest motion. No intracranial large or medium vessel occlusion identified presently. 2 mm left periophthalmic aneurysm. Electronically Signed   By: Nelson Chimes M.D.   On: 10/04/2019 07:45   ECHOCARDIOGRAM COMPLETE  Result Date: 10/05/2019    ECHOCARDIOGRAM REPORT   Patient Name:   Kayler Rise. Date of Exam: 10/05/2019 Medical Rec #:  [id number]              Height:       70.0 in Accession #:    [id number]             Weight:       312.2 lb Date of Birth:  1941-03-06              BSA:          2.522 m Patient Age:    75 years               BP:           123/68 mmHg Patient Gender: M                      HR:           72 bpm. Exam Location:  Inpatient Procedure: 2D Echo and Cardiac Doppler Indications:    Stroke  History:        Patient has no prior history of Echocardiogram examinations.                 Risk Factors:Hypertension, Dyslipidemia and Former Smoker. CKD.  Sonographer:    Clayton Lefort RDCS (AE) Referring Phys: [id number] [facility] ADEFESO  Sonographer Comments: Definity use attempted. Patient reported pain at IV site with saline flush. No furthuer attempts made to use Definity. Nurse notified of patient reaction. IMPRESSIONS  1. Left ventricular ejection fraction, by estimation, is 45  to 50%. The left ventricle has normal function. The left ventricle demonstrates global hypokinesis. There is severe left ventricular hypertrophy. Left ventricular diastolic parameters are consistent with Grade I diastolic dysfunction (impaired relaxation).  2. Right ventricular systolic function is low normal. The right ventricular size is normal.  3. The mitral valve is abnormal.  Trivial mitral valve regurgitation.  4. The aortic valve is tricuspid. Aortic valve regurgitation is not visualized. Mild aortic valve sclerosis is present, with no evidence of aortic valve stenosis.  5. Aortic dilatation noted. There is moderate dilatation at the level of the sinuses of Valsalva measuring 44 mm.  6. Definity contrast not given due to possible IV infiltration FINDINGS  Left Ventricle: Left ventricular ejection fraction, by estimation, is 45 to 50%. The left ventricle has normal function. The left ventricle demonstrates global hypokinesis. The left ventricular internal cavity size was normal in size. There is severe left ventricular hypertrophy. Left ventricular diastolic parameters are consistent with Grade I diastolic dysfunction (impaired relaxation). Indeterminate filling pressures. Right Ventricle: The right ventricular size is normal. No increase in right ventricular wall thickness. Right ventricular systolic function is low normal. Left Atrium: Left atrial size was normal in size. Right Atrium: Right atrial size was normal in size. Pericardium: There is no evidence of pericardial effusion. Mitral Valve: The mitral valve is abnormal. There is mild thickening of the mitral valve leaflet(s). Mild to moderate mitral annular calcification. Trivial mitral valve regurgitation. MV peak gradient, 9.1 mmHg. The mean mitral valve gradient is 2.0 mmHg. Tricuspid Valve: The tricuspid valve is grossly normal. Tricuspid valve regurgitation is trivial. Aortic Valve: The aortic valve is tricuspid. Aortic valve regurgitation is not visualized. Mild aortic valve sclerosis is present, with no evidence of aortic valve stenosis. Aortic valve mean gradient measures 3.0 mmHg. Aortic valve peak gradient measures 5.4 mmHg. Aortic valve area, by VTI measures 2.70 cm. Pulmonic Valve: The pulmonic valve was normal in structure. Pulmonic valve regurgitation is not visualized. Aorta: Aortic dilatation noted. There is  moderate dilatation at the level of the sinuses of Valsalva measuring 44 mm. Venous: The inferior vena cava was not well visualized. IAS/Shunts: No atrial level shunt detected by color flow Doppler.  LEFT VENTRICLE PLAX 2D LVIDd:         4.33 cm  Diastology LVIDs:         3.33 cm  LV e' lateral:   5.56 cm/s LV PW:         2.29 cm  LV E/e' lateral: 12.2 LV IVS:        1.80 cm  LV e' medial:    3.75 cm/s LVOT diam:     2.50 cm  LV E/e' medial:  18.1 LV SV:         68 LV SV Index:   27 LVOT Area:     4.91 cm  RIGHT VENTRICLE RV S prime:     10.60 cm/s TAPSE (M-mode): 2.1 cm LEFT ATRIUM             Index       RIGHT ATRIUM           Index LA diam:        3.00 cm 1.19 cm/m  RA Area:     16.90 cm LA Vol (A2C):   95.8 ml 37.98 ml/m RA Volume:   40.20 ml  15.94 ml/m LA Vol (A4C):   64.6 ml 25.61 ml/m LA Biplane Vol: 82.0 ml 32.51 ml/m  AORTIC VALVE AV  Area (Vmax):    3.45 cm AV Area (Vmean):   3.53 cm AV Area (VTI):     2.70 cm AV Vmax:           116.00 cm/s AV Vmean:          74.400 cm/s AV VTI:            0.253 m AV Peak Grad:      5.4 mmHg AV Mean Grad:      3.0 mmHg LVOT Vmax:         81.50 cm/s LVOT Vmean:        53.500 cm/s LVOT VTI:          0.139 m LVOT/AV VTI ratio: 0.55  AORTA Ao Root diam: 4.40 cm MITRAL VALVE MV Area (PHT): 3.30 cm     SHUNTS MV Peak grad:  9.1 mmHg     Systemic VTI:  0.14 m MV Mean grad:  2.0 mmHg     Systemic Diam: 2.50 cm MV Vmax:       1.51 m/s MV Vmean:      63.9 cm/s MV Decel Time: 230 msec MV E velocity: 67.70 cm/s MV A velocity: 127.00 cm/s MV E/A ratio:  0.53 Lyman Bishop MD Electronically signed by Lyman Bishop MD Signature Date/Time: 10/05/2019/4:11:54 PM    Final    CT HEAD CODE STROKE WO CONTRAST  Result Date: 10/03/2019 CLINICAL DATA:  Code stroke. Possible stroke, neuro deficit, acute, stroke suspected. Additional history obtained from [place] arm weakness, numbness and neglect, last known normal 10/02/2019 at 23:00 EXAM: CT HEAD WITHOUT  CONTRAST TECHNIQUE: Contiguous axial images were obtained from the base of the skull through the vertex without intravenous contrast. COMPARISON:  No pertinent prior studies available for comparison. FINDINGS: Brain: There is mild generalized parenchymal atrophy. Mild ill-defined hypoattenuation within the cerebral white matter is nonspecific, but consistent with chronic small vessel ischemic disease. There is no acute intracranial hemorrhage. No demarcated cortical infarct is identified. No extra-axial fluid collection. No evidence of intracranial mass. No midline shift. Vascular: No hyperdense vessel.  Atherosclerotic calcifications Skull: Normal. Negative for fracture or focal lesion. Sinuses/Orbits: Visualized orbits show no acute finding. No significant paranasal sinus disease or mastoid effusion at the imaged levels. ASPECTS [facility] Stroke Program Early CT Score) - Ganglionic level infarction (caudate, lentiform nuclei, internal capsule, insula, M1-M3 cortex): 7 - Supraganglionic infarction (M4-M6 cortex): 3 Total score (0-10 with 10 being normal): 10 These results were communicated to Dr. Rory Percy At 10:34 pmon 6/5/2021by text page via the [facility] messaging system. IMPRESSION: 1. No CT evidence of acute intracranial abnormality.  ASPECTS is 10. 2. Mild generalized parenchymal atrophy and chronic small vessel ischemic disease Electronically Signed   By: Kellie Simmering DO   On: 10/03/2019 22:34   {    Assessment and Plan:   1. NSVT  Self limited  Patient not symptomatic   I have reviewed echo  Very difficult images   Severe LVH    I would recomm a repeat limited echo ( I have placed order for this ) with Definity to be done in echo lab   Confirm LV measurements    Define wall motion/LVEF furhter   ? Hypertrophy elsewhere For now would recomm b blocker Rx with carvedilol Discussed with EP   They have been contacted for possible LINQ        2   LV dysfunction.    WIll repeat  Echo to confirm LV  function    Volume status is OK   Pt with history of DM has risk for CAD No active symptoms  3   HTN   BP  139-151/   Follow on [facility]  4  HL   On liipitor  LDL was 105    WOuld increase to 80 mg and folllow  Goal lower with hx of DM and CVA     5  CVA   L parietal CVA  Pt on ASA and Plavix Plan to transfer patient to rehab     For questions or updates, please contact [facility] Please consult [email or web address] for contact info under    Signed, Dorris Carnes, MD  10/06/2019 1:55 PM

## 2019-10-06 NOTE — Progress Notes (Addendum)
PROGRESS NOTE    Nathan Proctor.  GUY:[id number] DOB: March 05, 1941 DOA: 10/03/2019 PCP: Nolene Ebbs, MD   No chief complaint on file.   Brief Narrative:  Nathan Wishart. is Nathan Proctor 79 y.o. male with medical history significant for type 2 diabetes mellitus, CKD 3, morbid obesity, diabetic neuropathy, hypertension, hyperlipidemia who presents to the emergency department due to right-sided weakness noted when he woke up this morning around 5 AM.  Patient was last well known to be at baseline when he went to bed at 11:59 PM on 10/02/2019, on waking up this morning around 5 AM, he noted right arm weakness, but he did not think much of it because he thought it will self resolve shortly, unfortunately, the weakness did not improve, so he activated EMS and on arrival of EMS, patient was noted to exhibit some right-sided neglect and it was also hypoglycemic with blood glucose of 57.   1/2 ample of D50 was given in route to the ED.  He denies headache, chest pain, shortness of breath, fever, chills, nausea, vomiting or any sick contact.  ED Course:  In the emergency department, he was hemodynamically stable.  Work-up in the ED showed macrocytic anemia, BUN/creatinine 31/1.85 (last creatinine for comparison was 3 years ago with creatinine of 1.42-1.48).  Blood glucose was 66.  SARS coronavirus 2 was negative.  CT head without contrast showed no acute ventricular abnormality.  Neurologist (Dr. Rory Percy, Ashish) was consulted and recommended further stroke work-up.  Hospitalist was also admitted for further evaluation monitor.  He was admitted for RUE weakness and found to have an acute stroke.  He's been started on DAPT and statin.  Currently planning for CIR.  Pt noted to have recurrent episodes of nonsustained vtach and will have cardiology c/s prior to transfer to CIR.  Also needs loop recorder.   Assessment & Plan:   Principal Problem:   Acute ischemic stroke [facility]) Active Problems:   Gout    Essential hypertension   Hypoglycemia due to insulin   CKD (chronic kidney disease), stage III   Dehydration   Dyslipidemia   Stroke [facility])  Acute ischemic stroke Patient presents with right arm weakness CT head without contrast showed no acute intracranial abnormality. MRI brain with areas of acute infarction affecting cortex in the L parietal region c/w L MCA branch vessel territory infarction, mild chronic small vessel change, smooth plaque affecting the ICA bulb on the L with stenosis of 30%, cannot r/o vertebral artery origin narrowing, no intracranial large or medium vessel occlusion, 2 mm L periopthalmic aneurysm (see report) Echocardiogram with grade 1 diastolic dysfunction, EF 56-38% Continue DAPT x 3 weeks, then aspirin alone and statin PT/OT/SLP -> CIR A1c (5.6), lipid panel (LDL 105) Telemetry - sinus Appreciate neurology recommendations - thought to be embolic with unknown source - recommending DAPT x 3 weeks, then ASA alone - neurology recommending loop recorder as well, cardiology has been c/s   NSVT:  Several episodes, this morning with ~16 seconds when he was using bathroom.  Will discuss with cardiology.  Start coreg.  Heart Failure: EF 45-50% with global hypokinesis, grade 1 diastolic dysfunction.  Borderline EF.  Started on coreg as noted above.   Appreciate cards recs.  Hypoglycemia in the setting of type 2 diabetes mellitus Dehydration A1c 5.6, per med rec, he's on 30 units 70/30 daily, this likely can be d/c'd or decreased - follow on SSI only - would follow on SSI only in CIR.  CKD  stage III BUN/creatinine 31/1.85 (last creatinine for comparison was 3 years ago with creatinine of 1.42-1.48) Improved to 1.36, follow with IVF Renally adjust medications, avoid nephrotoxic agents/dehydration/hypotension  Essential hypertension Permissive hypertension will be allowed at this time due to acute ischemic stroke  Hyperlipidemia Continue statin as indicated  above  Gout Allopurinol will be held at this time due to CKD  Morbid obesity (BMI 44.79) Patient will follow up with PCP for weight loss program  DVT prophylaxis: heparin Code Status: full  Family Communication: none at bedside Disposition:   Status is: Inpatient  The patient will require care spanning > 2 midnights and should be moved to inpatient because: Inpatient level of care appropriate due to severity of illness  Dispo: The patient is from: Home              Anticipated d/c is to: CIR              Anticipated d/c date is: 1 day              Patient currently is not medically stable to d/c.   Consultants:   neurology  Procedures:    Antimicrobials:  Anti-infectives (From admission, onward)   None     Subjective: No complaints today  Objective: Vitals:   10/05/19 0735 10/05/19 1753 10/05/19 2304 10/06/19 0741  BP: 123/68 (!) 152/66 (!) 155/66 (!) 151/70  Pulse: 72 81 78 75  Resp: 18 18 20 18   Temp: 98.1 F (36.7 C) 97.7 F (36.5 C) 98.5 F (36.9 C) 98.9 F (37.2 C)  TempSrc: Oral  Oral   SpO2: 96% 95% 96% 95%  Weight:      Height:        Intake/Output Summary (Last 24 hours) at 10/06/2019 1333 Last data filed at 10/06/2019 0411 Gross per 24 hour  Intake --  Output 1325 ml  Net -1325 ml   Filed Weights   10/03/19 2226 10/04/19 0210  Weight: (!) 142.4 kg (!) 141.6 kg    Examination:  General: No acute distress. Cardiovascular: Heart sounds show Nathan Proctor regular rate, and rhythm. Lungs: Clear to auscultation bilaterally  Abdomen: Soft, nontender, nondistended  Neurological: Alert and oriented 3. Moves all extremities 4.  Strength to upper extremities similar today.  Cranial nerves II through XII grossly intact. Skin: Warm and dry. No rashes or lesions. Extremities: No clubbing or cyanosis. No edema  Data Reviewed: I have personally reviewed following labs and imaging studies  CBC: Recent Labs  Lab 10/03/19 2217 10/03/19 2224 10/04/19 0531  10/05/19 0243 10/06/19 0356  WBC 6.8  --  5.4 5.4 5.6  NEUTROABS 4.7  --   --  2.8 3.4  HGB 11.2* 11.2* 10.9* 11.0* 11.1*  HCT 36.6* 33.0* 35.0* 34.5* 34.1*  MCV 101.4*  --  97.5 96.6 94.2  PLT 180  --  167 184 834    Basic Metabolic Panel: Recent Labs  Lab 10/03/19 2217 10/03/19 2224 10/04/19 0531 10/05/19 0243 10/06/19 0356  NA 143 145 140 142 140  K 4.3 4.1 4.0 3.9 3.8  CL 113* 111 109 109 105  CO2 23  --  24 25 24   GLUCOSE 66* 61* 251* 101* 110*  BUN 31* 30* 30* 26* 21  CREATININE 1.85* 1.90* 1.59* 1.50* 1.36*  CALCIUM 9.1  --  9.1 9.2 9.3  MG  --   --  1.9 1.9 1.8  PHOS  --   --  3.2 3.3 3.0    GFR: Estimated  Creatinine Clearance: 61.5 mL/min (Nathan Proctor) (by C-G formula based on SCr of 1.36 mg/dL (H)).  Liver Function Tests: Recent Labs  Lab 10/03/19 2217 10/04/19 0531 10/05/19 0243 10/06/19 0356  AST 15 16 14* 15  ALT 12 12 11 12   ALKPHOS 68 68 71 68  BILITOT 1.1 0.8 0.8 1.0  PROT 6.5 6.1* 6.3* 6.4*  ALBUMIN 3.7 3.2* 3.4* 3.4*    CBG: Recent Labs  Lab 10/05/19 1134 10/05/19 1749 10/05/19 2109 10/06/19 0740 10/06/19 1143  GLUCAP 151* 154* 125* 115* 166*     Recent Results (from the past 240 hour(s))  SARS Coronavirus 2 by RT PCR (hospital order, performed in [facility] hospital lab) Nasopharyngeal Nasopharyngeal Swab     Status: None   Collection Time: 10/03/19 10:41 PM   Specimen: Nasopharyngeal Swab  Result Value Ref Range Status   SARS Coronavirus 2 NEGATIVE NEGATIVE Final    Comment: (NOTE) SARS-CoV-2 target nucleic acids are NOT DETECTED. The SARS-CoV-2 RNA is generally detectable in upper and lower respiratory specimens during the acute phase of infection. The lowest concentration of SARS-CoV-2 viral copies this assay can detect is 250 copies / mL. Nathan Proctor negative result does not preclude SARS-CoV-2 infection and should not be used as the sole basis for treatment or other patient management decisions.  Nathan Proctor negative result may occur with improper  specimen collection / handling, submission of specimen other than nasopharyngeal swab, presence of viral mutation(s) within the areas targeted by this assay, and inadequate number of viral copies (<250 copies / mL). Nathan Proctor negative result must be combined with clinical observations, patient history, and epidemiological information. Fact Sheet for Patients:   StrictlyIdeas.no Fact Sheet for Healthcare Providers: BankingDealers.co.za This test is not yet approved or cleared  by the [place] FDA and has been authorized for detection and/or diagnosis of SARS-CoV-2 by FDA under an Emergency Use Authorization (EUA).  This EUA will remain in effect (meaning this test can be used) for the duration of the COVID-19 declaration under Section 564(b)(1) of the Act, 21 U.S.C. section 360bbb-3(b)(1), unless the authorization is terminated or revoked sooner. Performed at [facility], [place] [address], [place], [place] [address]          Radiology Studies: ECHOCARDIOGRAM COMPLETE  Result Date: 10/05/2019    ECHOCARDIOGRAM REPORT   Patient Name:   Nathan Yan. Date of Exam: 10/05/2019 Medical Rec #:  [id number]              Height:       70.0 in Accession #:    [id number]             Weight:       312.2 lb Date of Birth:  04/23/1941              BSA:          2.522 m Patient Age:    7 years               BP:           123/68 mmHg Patient Gender: M                      HR:           72 bpm. Exam Location:  Inpatient Procedure: 2D Echo and Cardiac Doppler Indications:    Stroke  History:        Patient has no prior history of Echocardiogram examinations.  Risk Factors:Hypertension, Dyslipidemia and Former Smoker. CKD.  Sonographer:    Nathan Proctor RDCS (AE) Referring Phys: [id number] [facility] Nathan Proctor  Sonographer Comments: Definity use attempted. Patient reported pain at IV site with saline flush. No furthuer attempts made to use Definity.  Nurse notified of patient reaction. IMPRESSIONS  1. Left ventricular ejection fraction, by estimation, is 45 to 50%. The left ventricle has normal function. The left ventricle demonstrates global hypokinesis. There is severe left ventricular hypertrophy. Left ventricular diastolic parameters are consistent with Grade I diastolic dysfunction (impaired relaxation).  2. Right ventricular systolic function is low normal. The right ventricular size is normal.  3. The mitral valve is abnormal. Trivial mitral valve regurgitation.  4. The aortic valve is tricuspid. Aortic valve regurgitation is not visualized. Mild aortic valve sclerosis is present, with no evidence of aortic valve stenosis.  5. Aortic dilatation noted. There is moderate dilatation at the level of the sinuses of Valsalva measuring 44 mm.  6. Definity contrast not given due to possible IV infiltration FINDINGS  Left Ventricle: Left ventricular ejection fraction, by estimation, is 45 to 50%. The left ventricle has normal function. The left ventricle demonstrates global hypokinesis. The left ventricular internal cavity size was normal in size. There is severe left ventricular hypertrophy. Left ventricular diastolic parameters are consistent with Grade I diastolic dysfunction (impaired relaxation). Indeterminate filling pressures. Right Ventricle: The right ventricular size is normal. No increase in right ventricular wall thickness. Right ventricular systolic function is low normal. Left Atrium: Left atrial size was normal in size. Right Atrium: Right atrial size was normal in size. Pericardium: There is no evidence of pericardial effusion. Mitral Valve: The mitral valve is abnormal. There is mild thickening of the mitral valve leaflet(s). Mild to moderate mitral annular calcification. Trivial mitral valve regurgitation. MV peak gradient, 9.1 mmHg. The mean mitral valve gradient is 2.0 mmHg. Tricuspid Valve: The tricuspid valve is grossly normal. Tricuspid  valve regurgitation is trivial. Aortic Valve: The aortic valve is tricuspid. Aortic valve regurgitation is not visualized. Mild aortic valve sclerosis is present, with no evidence of aortic valve stenosis. Aortic valve mean gradient measures 3.0 mmHg. Aortic valve peak gradient measures 5.4 mmHg. Aortic valve area, by VTI measures 2.70 cm. Pulmonic Valve: The pulmonic valve was normal in structure. Pulmonic valve regurgitation is not visualized. Aorta: Aortic dilatation noted. There is moderate dilatation at the level of the sinuses of Valsalva measuring 44 mm. Venous: The inferior vena cava was not well visualized. IAS/Shunts: No atrial level shunt detected by color flow Doppler.  LEFT VENTRICLE PLAX 2D LVIDd:         4.33 cm  Diastology LVIDs:         3.33 cm  LV e' lateral:   5.56 cm/s LV PW:         2.29 cm  LV E/e' lateral: 12.2 LV IVS:        1.80 cm  LV e' medial:    3.75 cm/s LVOT diam:     2.50 cm  LV E/e' medial:  18.1 LV SV:         68 LV SV Index:   27 LVOT Area:     4.91 cm  RIGHT VENTRICLE RV S prime:     10.60 cm/s TAPSE (M-mode): 2.1 cm LEFT ATRIUM             Index       RIGHT ATRIUM           Index LA  diam:        3.00 cm 1.19 cm/m  RA Area:     16.90 cm LA Vol (A2C):   95.8 ml 37.98 ml/m RA Volume:   40.20 ml  15.94 ml/m LA Vol (A4C):   64.6 ml 25.61 ml/m LA Biplane Vol: 82.0 ml 32.51 ml/m  AORTIC VALVE AV Area (Vmax):    3.45 cm AV Area (Vmean):   3.53 cm AV Area (VTI):     2.70 cm AV Vmax:           116.00 cm/s AV Vmean:          74.400 cm/s AV VTI:            0.253 m AV Peak Grad:      5.4 mmHg AV Mean Grad:      3.0 mmHg LVOT Vmax:         81.50 cm/s LVOT Vmean:        53.500 cm/s LVOT VTI:          0.139 m LVOT/AV VTI ratio: 0.55  AORTA Ao Root diam: 4.40 cm MITRAL VALVE MV Area (PHT): 3.30 cm     SHUNTS MV Peak grad:  9.1 mmHg     Systemic VTI:  0.14 m MV Mean grad:  2.0 mmHg     Systemic Diam: 2.50 cm MV Vmax:       1.51 m/s MV Vmean:      63.9 cm/s MV Decel Time: 230 msec MV E  velocity: 67.70 cm/s MV Rockie Schnoor velocity: 127.00 cm/s MV E/Anup Brigham ratio:  0.53 Lyman Bishop MD Electronically signed by Lyman Bishop MD Signature Date/Time: 10/05/2019/4:11:54 PM    Final         Scheduled Meds: . aspirin EC  81 mg Oral Daily  . atorvastatin  80 mg Oral Daily  . carvedilol  12.5 mg Oral BID WC  . clopidogrel  75 mg Oral Daily  . heparin injection (subcutaneous)  5,000 Units Subcutaneous Q8H  . insulin aspart  0-5 Units Subcutaneous QHS  . insulin aspart  0-9 Units Subcutaneous TID WC   Continuous Infusions:   LOS: 2 days    Time spent: over 30 min    Fayrene Helper, MD [facility]   To contact the attending provider between 7A-7P or the covering provider during after hours 7P-7A, please log into the web site [email or web address] and access using universal  password for that web site. If you do not have the password, please call the hospital operator.  10/06/2019, 1:33 PM

## 2019-10-06 NOTE — H&P (Signed)
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 and importance of weight loss to help promote mobility and helth.  13.. H/o gout: Resume allopurinol  14. Dyslipidemia: Now on high dose statin.  15.  Anemia of chronic disease?:  CBC ordered.  Bary Leriche, PA-C 10/07/2019  I have  personally performed a face to face diagnostic evaluation, including, but not limited to relevant history and physical exam findings, of this patient and developed relevant assessment and plan.  Additionally, I have reviewed and concur with the physician assistant's documentation above.  Delice Lesch, MD, ABPMR

## 2019-10-06 NOTE — Progress Notes (Signed)
Physical Therapy Treatment Patient Details Name: Nathan Proctor. MRN: [id number] DOB: Jul 11, 1940 Today's Date: 10/06/2019    History of Present Illness Nathan Proctor. is a 79 y.o. male who has had a past medical history of diabetes, prostate cancer, CKD 3, diabetic neuropathy, morbid obesity, hypertension, hyperlipidemia, presenting with sudden onset of right arm weakness and slurred speech; MRI reported: "Areas of acute infarction affecting cortex in the left parietal region consistent with left MCA branch vessel territory infarction."      PT Comments    Pt demonstrating orientation, safety awareness, and attention deficits this day, and pt is very upset by cognitive deficits. PT oriented and assisted pt as needed, required increased cuing for safety during mobility this day. Pt ambulated hallway distance with min A from PT for steadying and hallway navigation, pt appearing to have less awareness of obstacles with + bumping into hallway objects x2. Pt requires rest breaks during mobility due to fatigability and generalized weakness. PT continuing to recommend CIR, will continue to follow acutely.    Follow Up Recommendations  CIR;Supervision/Assistance - 24 hour     Equipment Recommendations  Rolling walker with 5" wheels    Recommendations for Other Services       Precautions / Restrictions Precautions Precautions: Fall Restrictions Weight Bearing Restrictions: No    Mobility  Bed Mobility Overal bed mobility: Needs Assistance Bed Mobility: Supine to Sit     Supine to sit: Min assist;HOB elevated     General bed mobility comments: Min assist for bringing LEs to EOB, increased time and use of HOB elevation.  Transfers Overall transfer level: Needs assistance Equipment used: None Transfers: Sit to/from Stand Sit to Stand: Min guard         General transfer comment: for safety, increased time to rise and steady. Pt requests to attempt stand without  physical assist. Sit to stand x3, from EOB x2 and from hallway chair x1.  Ambulation/Gait Ambulation/Gait assistance: Min assist Gait Distance (Feet): 100 Feet(x2) Assistive device: 1 person hand held assist Gait Pattern/deviations: Wide base of support;Step-through pattern;Decreased stride length;Drifts right/left Gait velocity: decr   General Gait Details: Min assist for steadying, requires cuing for ambulating in middle of hallway as pt weaving L/R. Seated rest break x1 minute in hallway, pt limited by fatigue and "wooziness".   Stairs             Wheelchair Mobility    Modified Rankin (Stroke Patients Only) Modified Rankin (Stroke Patients Only) Pre-Morbid Rankin Score: No symptoms Modified Rankin: Moderately severe disability     Balance Overall balance assessment: Needs assistance Sitting-balance support: No upper extremity supported;Feet supported Sitting balance-Leahy Scale: Fair     Standing balance support: No upper extremity supported;During functional activity Standing balance-Leahy Scale: Fair Standing balance comment: able to ambulate without AD, occasional HHA or reaching for environment                            Cognition Arousal/Alertness: Awake/alert Behavior During Therapy: WFL for tasks assessed/performed Overall Cognitive Status: Impaired/Different from baseline Area of Impairment: Orientation;Attention;Memory                 Orientation Level: Disoriented to;Person;Place;Time Current Attention Level: Sustained     Safety/Judgement: Decreased awareness of safety;Decreased awareness of deficits Awareness: Emergent Problem Solving: Slow processing;Requires verbal cues;Requires tactile cues;Decreased initiation;Difficulty sequencing General Comments: Pt states he feels foggy, woozy (BP WFL). Pt unable to state  name without PT cuing "starts with an E", unable to state year without given options "2011 or 2021", and disoriented to  place. Pt aware he had a stroke, and is aware he is having trouble with memory today. Requires multimodal cuing for mobility today.      Exercises General Exercises - Upper Extremity Elbow Flexion: AROM;Right;15 reps;Seated;Other (comment)(in horizontal abduction and neutral) General Exercises - Lower Extremity Long Arc Quad: AROM;Both;10 reps;Seated    General Comments General comments (skin integrity, edema, etc.): HRmax 126 bpm during gait, requires rest break to recover.      Pertinent Vitals/Pain Pain Assessment: No/denies pain Pain Intervention(s): Limited activity within patient's tolerance;Monitored during session    Home Living                      Prior Function            PT Goals (current goals can now be found in the care plan section) Acute Rehab PT Goals Patient Stated Goal: Agreeable to therapy PT Goal Formulation: With patient Time For Goal Achievement: 10/18/19 Potential to Achieve Goals: Good Progress towards PT goals: Progressing toward goals    Frequency    Min 4X/week      PT Plan Current plan remains appropriate    Co-evaluation              AM-PAC PT "6 Clicks" Mobility   Outcome Measure  Help needed turning from your back to your side while in a flat bed without using bedrails?: A Little Help needed moving from lying on your back to sitting on the side of a flat bed without using bedrails?: A Little Help needed moving to and from a bed to a chair (including a wheelchair)?: A Little Help needed standing up from a chair using your arms (e.g., wheelchair or bedside chair)?: A Little Help needed to walk in hospital room?: A Little Help needed climbing 3-5 steps with a railing? : A Lot 6 Click Score: 17    End of Session Equipment Utilized During Treatment: Gait belt Activity Tolerance: Patient limited by fatigue Patient left: with call bell/phone within reach;in chair;with chair alarm set Nurse Communication: Mobility  status PT Visit Diagnosis: Unsteadiness on feet (R26.81);Other abnormalities of gait and mobility (R26.89);Hemiplegia and hemiparesis Hemiplegia - Right/Left: Right Hemiplegia - dominant/non-dominant: Dominant Hemiplegia - caused by: Cerebral infarction     Time: 4196-2229(- 5 minutes for talking on phone) PT Time Calculation (min) (ACUTE ONLY): 36 min  Charges:  $Gait Training: 8-22 mins $Therapeutic Activity: 8-22 mins                    Regana Kemple E, PT [place] Pager [phone]  Office [phone]    Yadira Hada D [place] 10/06/2019, 10:52 AM

## 2019-10-06 NOTE — Progress Notes (Signed)
Inpatient Rehabilitation Admissions Coordinator  Met with patient, wife, Dr. Harrington Challenger and Dr. Florene Glen. Will hold admit to complete cardiology workup . I will follow up tomorrow.  Danne Baxter, RN, MSN Rehab Admissions Coordinator [phone] 10/06/2019 3:13 PM

## 2019-10-06 NOTE — Progress Notes (Signed)
Occupational Therapy Treatment Patient Details Name: Nathan Proctor. MRN: [id number] DOB: 1940-07-29 Today's Date: 10/06/2019    History of present illness Nathan Proctor. is a 79 y.o. male who has had a past medical history of diabetes, prostate cancer, CKD 3, diabetic neuropathy, morbid obesity, hypertension, hyperlipidemia, presenting with sudden onset of right arm weakness and slurred speech; MRI reported: "Areas of acute infarction affecting cortex in the left parietal region consistent with left MCA branch vessel territory infarction."     OT comments  Pt presents supine in bed pleasant and agreeable to therapy session. Issued RUE HEP for strengthening and to further address fine motor/coordination deficits. Pt return demonstrating some exercises but requiring mod cues for return demo. Pt noted with difficulty with command follow and sequencing/motor planning certain tasks/instructions. HR up to low 100s with activity. Pt completing functional transfers with close minguard -minA throughout. Feel pt remains an excellent candidate for CIR level therapies at time of discharge. Will continue to follow acutely.   Follow Up Recommendations  CIR    Equipment Recommendations  3 in 1 bedside commode          Precautions / Restrictions Precautions Precautions: Fall Restrictions Weight Bearing Restrictions: No       Mobility Bed Mobility Overal bed mobility: Needs Assistance Bed Mobility: Sidelying to Sit;Sit to Sidelying   Sidelying to sit: Min guard     Sit to sidelying: Min guard General bed mobility comments: for safety, increased time/effort, HOB elevated   Transfers Overall transfer level: Needs assistance Equipment used: None Transfers: Sit to/from Stand Sit to Stand: Min guard         General transfer comment: for safety and balance, pt standing without cues to do so     Balance Overall balance assessment: Needs assistance Sitting-balance support: No  upper extremity supported;Feet supported Sitting balance-Leahy Scale: Fair     Standing balance support: No upper extremity supported;During functional activity Standing balance-Leahy Scale: Fair                             ADL either performed or assessed with clinical judgement   ADL Overall ADL's : Needs assistance/impaired       Grooming Details (indicate cue type and reason): issued red built up handled and educated in use for grooming items (toothbrush) as well as feeding utensils                              Functional mobility during ADLs: Minimal assistance General ADL Comments: focus on RUE fine motor strengthening/coordination                       Cognition Arousal/Alertness: Awake/alert Behavior During Therapy: WFL for tasks assessed/performed Overall Cognitive Status: Impaired/Different from baseline Area of Impairment: Attention;Memory;Following commands;Safety/judgement;Problem solving                   Current Attention Level: Sustained Memory: Decreased short-term memory Following Commands: Follows one step commands with increased time;Follows one step commands inconsistently Safety/Judgement: Decreased awareness of safety;Decreased awareness of deficits Awareness: Emergent Problem Solving: Slow processing;Requires verbal cues;Requires tactile cues;Decreased initiation;Difficulty sequencing General Comments: pt having difficulty sequencing/motor planning commands today        Exercises Exercises: General Upper Extremity;Other exercises General Exercises - Upper Extremity Shoulder Flexion: AROM;Right;10 reps;Seated Digit Composite Flexion: AROM;Right;10 reps;Seated Composite Extension: AROM;Right;10  reps;Seated Other Exercises Other Exercises: issued fine motor/coordination HEP and reviewed, pt return demonstrating multiple exercises x10 using RUE Other Exercises: opening/closing various sized containers/bottles  using RUE - requiring increased time and effort due to R hand weakness/incoordination but suspect also cognition playing a part    Shoulder Instructions       General Comments      Pertinent Vitals/ Pain       Pain Assessment: No/denies pain  Home Living                                          Prior Functioning/Environment              Frequency  Min 2X/week        Progress Toward Goals  OT Goals(current goals can now be found in the care plan section)  Progress towards OT goals: Progressing toward goals  Acute Rehab OT Goals Patient Stated Goal: Agreeable to therapy OT Goal Formulation: With patient Time For Goal Achievement: 10/18/19 Potential to Achieve Goals: Good ADL Goals Pt Will Perform Grooming: with set-up;with supervision;standing Pt Will Perform Upper Body Dressing: with set-up;with supervision;sitting Pt Will Perform Lower Body Dressing: with set-up;with supervision;sit to/from stand Pt Will Transfer to Toilet: with supervision;with set-up;ambulating;bedside commode Pt Will Perform Toileting - Clothing Manipulation and hygiene: with set-up;with supervision;sit to/from stand Additional ADL Goal #1: Pt will incorporate RUE into 75% of ADLs with 2-3 cues Additional ADL Goal #2: Pt will demonstrate understanding of FM cooridnation exercises with Min cues  Plan Discharge plan remains appropriate    Co-evaluation                 AM-PAC OT "6 Clicks" Daily Activity     Outcome Measure   Help from another person eating meals?: A Little Help from another person taking care of personal grooming?: A Little Help from another person toileting, which includes using toliet, bedpan, or urinal?: A Little Help from another person bathing (including washing, rinsing, drying)?: A Lot Help from another person to put on and taking off regular upper body clothing?: A Little Help from another person to put on and taking off regular lower body  clothing?: A Lot 6 Click Score: 16    End of Session    OT Visit Diagnosis: Unsteadiness on feet (R26.81);Other abnormalities of gait and mobility (R26.89);Muscle weakness (generalized) (M62.81);Hemiplegia and hemiparesis Hemiplegia - Right/Left: Right Hemiplegia - dominant/non-dominant: Dominant Hemiplegia - caused by: Cerebral infarction   Activity Tolerance Patient tolerated treatment well   Patient Left in bed;with call bell/phone within reach;with bed alarm set   Nurse Communication Mobility status        Time: 7741-4239 OT Time Calculation (min): 18 min  Charges: OT General Charges $OT Visit: 1 Visit OT Treatments $Therapeutic Activity: 8-22 mins  Lou Cal, OT Acute Rehabilitation Services Pager [phone] Office [place] 10/06/2019, 5:24 PM

## 2019-10-07 ENCOUNTER — Inpatient Hospital Stay (HOSPITAL_COMMUNITY)
Admission: RE | Admit: 2019-10-07 | Discharge: 2019-10-14 | DRG: 057 | Disposition: A | Discharge status: Home / Self Care | Payer: Medicare PPO | Source: Intra-hospital | Attending: Physical Medicine & Rehabilitation | Admitting: Physical Medicine & Rehabilitation

## 2019-10-07 ENCOUNTER — Encounter (HOSPITAL_COMMUNITY): Payer: Self-pay | Admitting: Physical Medicine & Rehabilitation

## 2019-10-07 ENCOUNTER — Other Ambulatory Visit: Payer: Self-pay | Admitting: Student

## 2019-10-07 ENCOUNTER — Other Ambulatory Visit: Payer: Self-pay

## 2019-10-07 DIAGNOSIS — N183 Chronic kidney disease, stage 3 unspecified: Secondary | ICD-10-CM

## 2019-10-07 DIAGNOSIS — I63512 Cerebral infarction due to unspecified occlusion or stenosis of left middle cerebral artery: Secondary | ICD-10-CM | POA: Diagnosis not present

## 2019-10-07 DIAGNOSIS — E785 Hyperlipidemia, unspecified: Secondary | ICD-10-CM | POA: Diagnosis present

## 2019-10-07 DIAGNOSIS — I5022 Chronic systolic (congestive) heart failure: Secondary | ICD-10-CM

## 2019-10-07 DIAGNOSIS — E1142 Type 2 diabetes mellitus with diabetic polyneuropathy: Secondary | ICD-10-CM

## 2019-10-07 DIAGNOSIS — I69392 Facial weakness following cerebral infarction: Secondary | ICD-10-CM

## 2019-10-07 DIAGNOSIS — Z794 Long term (current) use of insulin: Secondary | ICD-10-CM

## 2019-10-07 DIAGNOSIS — N179 Acute kidney failure, unspecified: Secondary | ICD-10-CM | POA: Diagnosis present

## 2019-10-07 DIAGNOSIS — R627 Adult failure to thrive: Secondary | ICD-10-CM | POA: Diagnosis present

## 2019-10-07 DIAGNOSIS — I472 Ventricular tachycardia: Secondary | ICD-10-CM | POA: Diagnosis present

## 2019-10-07 DIAGNOSIS — Z8546 Personal history of malignant neoplasm of prostate: Secondary | ICD-10-CM

## 2019-10-07 DIAGNOSIS — I69351 Hemiplegia and hemiparesis following cerebral infarction affecting right dominant side: Principal | ICD-10-CM

## 2019-10-07 DIAGNOSIS — I4729 Other ventricular tachycardia: Secondary | ICD-10-CM

## 2019-10-07 DIAGNOSIS — I10 Essential (primary) hypertension: Secondary | ICD-10-CM

## 2019-10-07 DIAGNOSIS — Z6841 Body Mass Index (BMI) 40.0 and over, adult: Secondary | ICD-10-CM

## 2019-10-07 DIAGNOSIS — I428 Other cardiomyopathies: Secondary | ICD-10-CM | POA: Diagnosis present

## 2019-10-07 DIAGNOSIS — Z7289 Other problems related to lifestyle: Secondary | ICD-10-CM

## 2019-10-07 DIAGNOSIS — N189 Chronic kidney disease, unspecified: Secondary | ICD-10-CM

## 2019-10-07 DIAGNOSIS — D638 Anemia in other chronic diseases classified elsewhere: Secondary | ICD-10-CM | POA: Diagnosis present

## 2019-10-07 DIAGNOSIS — E1122 Type 2 diabetes mellitus with diabetic chronic kidney disease: Secondary | ICD-10-CM | POA: Diagnosis present

## 2019-10-07 DIAGNOSIS — Z79899 Other long term (current) drug therapy: Secondary | ICD-10-CM

## 2019-10-07 DIAGNOSIS — K5909 Other constipation: Secondary | ICD-10-CM | POA: Diagnosis present

## 2019-10-07 DIAGNOSIS — Z87891 Personal history of nicotine dependence: Secondary | ICD-10-CM

## 2019-10-07 DIAGNOSIS — M109 Gout, unspecified: Secondary | ICD-10-CM | POA: Diagnosis present

## 2019-10-07 DIAGNOSIS — Z713 Dietary counseling and surveillance: Secondary | ICD-10-CM

## 2019-10-07 DIAGNOSIS — I700 Atherosclerosis of aorta: Secondary | ICD-10-CM | POA: Diagnosis present

## 2019-10-07 DIAGNOSIS — I69393 Ataxia following cerebral infarction: Secondary | ICD-10-CM | POA: Diagnosis not present

## 2019-10-07 DIAGNOSIS — D631 Anemia in chronic kidney disease: Secondary | ICD-10-CM | POA: Diagnosis present

## 2019-10-07 DIAGNOSIS — I639 Cerebral infarction, unspecified: Secondary | ICD-10-CM

## 2019-10-07 DIAGNOSIS — I129 Hypertensive chronic kidney disease with stage 1 through stage 4 chronic kidney disease, or unspecified chronic kidney disease: Secondary | ICD-10-CM | POA: Diagnosis present

## 2019-10-07 LAB — CBC WITH DIFFERENTIAL/PLATELET
Abs Immature Granulocytes: 0.02 10*3/uL (ref 0.00–0.07)
Basophils Absolute: 0 10*3/uL (ref 0.0–0.1)
Basophils Relative: 0 %
Eosinophils Absolute: 0.1 10*3/uL (ref 0.0–0.5)
Eosinophils Relative: 1 %
HCT: 35.7 % — ABNORMAL LOW (ref 39.0–52.0)
Hemoglobin: 11.6 g/dL — ABNORMAL LOW (ref 13.0–17.0)
Immature Granulocytes: 0 %
Lymphocytes Relative: 27 %
Lymphs Abs: 1.5 10*3/uL (ref 0.7–4.0)
MCH: 30.9 pg (ref 26.0–34.0)
MCHC: 32.5 g/dL (ref 30.0–36.0)
MCV: 94.9 fL (ref 80.0–100.0)
Monocytes Absolute: 0.7 10*3/uL (ref 0.1–1.0)
Monocytes Relative: 13 %
Neutro Abs: 3.2 10*3/uL (ref 1.7–7.7)
Neutrophils Relative %: 59 %
Platelets: 175 10*3/uL (ref 150–400)
RBC: 3.76 MIL/uL — ABNORMAL LOW (ref 4.22–5.81)
RDW: 13.2 % (ref 11.5–15.5)
WBC: 5.5 10*3/uL (ref 4.0–10.5)
nRBC: 0 % (ref 0.0–0.2)

## 2019-10-07 LAB — COMPREHENSIVE METABOLIC PANEL
ALT: 12 U/L (ref 0–44)
AST: 18 U/L (ref 15–41)
Albumin: 3.4 g/dL — ABNORMAL LOW (ref 3.5–5.0)
Alkaline Phosphatase: 69 U/L (ref 38–126)
Anion gap: 6 (ref 5–15)
BUN: 24 mg/dL — ABNORMAL HIGH (ref 8–23)
CO2: 26 mmol/L (ref 22–32)
Calcium: 9.4 mg/dL (ref 8.9–10.3)
Chloride: 109 mmol/L (ref 98–111)
Creatinine, Ser: 1.47 mg/dL — ABNORMAL HIGH (ref 0.61–1.24)
GFR calc Af Amer: 51 mL/min — ABNORMAL LOW (ref >60)
GFR calc non Af Amer: 44 mL/min — ABNORMAL LOW (ref >60)
Glucose, Bld: 106 mg/dL — ABNORMAL HIGH (ref 70–99)
Potassium: 4 mmol/L (ref 3.5–5.1)
Sodium: 141 mmol/L (ref 135–145)
Total Bilirubin: 1 mg/dL (ref 0.3–1.2)
Total Protein: 6.4 g/dL — ABNORMAL LOW (ref 6.5–8.1)

## 2019-10-07 LAB — GLUCOSE, CAPILLARY
Glucose-Capillary: 115 mg/dL — ABNORMAL HIGH (ref 70–99)
Glucose-Capillary: 165 mg/dL — ABNORMAL HIGH (ref 70–99)
Glucose-Capillary: 169 mg/dL — ABNORMAL HIGH (ref 70–99)
Glucose-Capillary: 159 mg/dL — ABNORMAL HIGH (ref 70–99)
Glucose-Capillary: 173 mg/dL — ABNORMAL HIGH (ref 70–99)

## 2019-10-07 LAB — MAGNESIUM: Magnesium: 1.9 mg/dL (ref 1.7–2.4)

## 2019-10-07 LAB — PHOSPHORUS: Phosphorus: 3.1 mg/dL (ref 2.5–4.6)

## 2019-10-07 MED ORDER — ALLOPURINOL 100 MG PO TABS
100.0000 mg | ORAL_TABLET | Freq: Every day | ORAL | Status: DC
  Administered 2019-10-07 – 2019-10-14 (×8): 100 mg via ORAL
  Filled 2019-10-07 (×8): qty 1

## 2019-10-07 MED ORDER — ALUM & MAG HYDROXIDE-SIMETH 200-200-20 MG/5ML PO SUSP: 30.0000 mL | ORAL | Status: DC | PRN

## 2019-10-07 MED ORDER — CARVEDILOL 12.5 MG PO TABS
12.5000 mg | ORAL_TABLET | Freq: Two times a day (BID) | ORAL | Status: DC
  Administered 2019-10-07 – 2019-10-12 (×10): 12.5 mg via ORAL
  Filled 2019-10-07 (×10): qty 1

## 2019-10-07 MED ORDER — CLOPIDOGREL BISULFATE 75 MG PO TABS
75.0000 mg | ORAL_TABLET | Freq: Every day | ORAL | Status: DC
  Administered 2019-10-08 – 2019-10-14 (×7): 75 mg via ORAL
  Filled 2019-10-07 (×7): qty 1

## 2019-10-07 MED ORDER — INSULIN ASPART 100 UNIT/ML ~~LOC~~ SOLN
0.0000 [IU] | Freq: Three times a day (TID) | SUBCUTANEOUS | Status: DC
  Administered 2019-10-07: 2 [IU] via SUBCUTANEOUS
  Administered 2019-10-08 – 2019-10-13 (×8): 1 [IU] via SUBCUTANEOUS

## 2019-10-07 MED ORDER — GUAIFENESIN-DM 100-10 MG/5ML PO SYRP: 5.0000 mL | ORAL_SOLUTION | Freq: Four times a day (QID) | ORAL | Status: DC | PRN

## 2019-10-07 MED ORDER — ATORVASTATIN CALCIUM 80 MG PO TABS: 80.0000 mg | ORAL_TABLET | Freq: Every day | ORAL | Status: DC

## 2019-10-07 MED ORDER — PROCHLORPERAZINE EDISYLATE 10 MG/2ML IJ SOLN: 5.0000 mg | Freq: Four times a day (QID) | INTRAMUSCULAR | Status: DC | PRN

## 2019-10-07 MED ORDER — POLYETHYLENE GLYCOL 3350 17 G PO PACK: 17.0000 g | PACK | Freq: Every day | ORAL | Status: DC | PRN

## 2019-10-07 MED ORDER — ENOXAPARIN SODIUM 40 MG/0.4ML ~~LOC~~ SOLN
40.0000 mg | SUBCUTANEOUS | Status: DC
  Administered 2019-10-07 – 2019-10-08 (×2): 40 mg via SUBCUTANEOUS
  Filled 2019-10-07 (×2): qty 0.4

## 2019-10-07 MED ORDER — ATORVASTATIN CALCIUM 80 MG PO TABS
80.0000 mg | ORAL_TABLET | Freq: Every day | ORAL | Status: DC
  Administered 2019-10-08 – 2019-10-14 (×7): 80 mg via ORAL
  Filled 2019-10-07 (×7): qty 1

## 2019-10-07 MED ORDER — ACETAMINOPHEN 325 MG PO TABS: 325.0000 mg | ORAL_TABLET | ORAL | Status: DC | PRN

## 2019-10-07 MED ORDER — FLEET ENEMA 7-19 GM/118ML RE ENEM: 1.0000 | ENEMA | Freq: Once | RECTAL | Status: DC | PRN

## 2019-10-07 MED ORDER — AMLODIPINE BESYLATE 5 MG PO TABS: 5.0000 mg | ORAL_TABLET | Freq: Every day | ORAL | Status: DC

## 2019-10-07 MED ORDER — ASPIRIN 81 MG PO TBEC: 81.0000 mg | DELAYED_RELEASE_TABLET | Freq: Every day | ORAL | Status: DC

## 2019-10-07 MED ORDER — LINACLOTIDE 145 MCG PO CAPS
145.0000 ug | ORAL_CAPSULE | Freq: Every day | ORAL | Status: DC | PRN
  Filled 2019-10-07: qty 1

## 2019-10-07 MED ORDER — CLOPIDOGREL BISULFATE 75 MG PO TABS: 75.0000 mg | ORAL_TABLET | Freq: Every day | ORAL | Status: DC

## 2019-10-07 MED ORDER — PROCHLORPERAZINE 25 MG RE SUPP: 12.5000 mg | Freq: Four times a day (QID) | RECTAL | Status: DC | PRN

## 2019-10-07 MED ORDER — PROCHLORPERAZINE MALEATE 5 MG PO TABS: 5.0000 mg | ORAL_TABLET | Freq: Four times a day (QID) | ORAL | Status: DC | PRN

## 2019-10-07 MED ORDER — BISACODYL 10 MG RE SUPP: 10.0000 mg | Freq: Every day | RECTAL | Status: DC | PRN

## 2019-10-07 MED ORDER — TRAZODONE HCL 50 MG PO TABS: 25.0000 mg | ORAL_TABLET | Freq: Every evening | ORAL | Status: DC | PRN

## 2019-10-07 MED ORDER — INSULIN ASPART 100 UNIT/ML ~~LOC~~ SOLN: 0.0000 [IU] | Freq: Every day | SUBCUTANEOUS | Status: DC

## 2019-10-07 MED ORDER — ASPIRIN EC 81 MG PO TBEC
81.0000 mg | DELAYED_RELEASE_TABLET | Freq: Every day | ORAL | Status: DC
  Administered 2019-10-08 – 2019-10-14 (×7): 81 mg via ORAL
  Filled 2019-10-07 (×7): qty 1

## 2019-10-07 MED ORDER — DIPHENHYDRAMINE HCL 12.5 MG/5ML PO ELIX: 12.5000 mg | ORAL_SOLUTION | Freq: Four times a day (QID) | ORAL | Status: DC | PRN

## 2019-10-07 MED ORDER — INSULIN ASPART PROT & ASPART (70-30 MIX) 100 UNIT/ML ~~LOC~~ SUSP
5.0000 [IU] | Freq: Every day | SUBCUTANEOUS | Status: DC
  Administered 2019-10-08 – 2019-10-14 (×7): 5 [IU] via SUBCUTANEOUS
  Filled 2019-10-07: qty 10

## 2019-10-07 NOTE — Plan of Care (Signed)
  Problem: Education: Goal: Knowledge of disease or condition will improve Outcome: Progressing Goal: Knowledge of secondary prevention will improve Outcome: Progressing Goal: Knowledge of patient specific risk factors addressed and post discharge goals established will improve Outcome: Progressing   Problem: Health Behavior/Discharge Planning: Goal: Ability to manage health-related needs will improve Outcome: Progressing

## 2019-10-07 NOTE — H&P (Signed)
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 diet and importance of weight loss to help promote mobility and helth.  13.. H/o gout: Resume allopurinol  14. Dyslipidemia: Now on high dose statin.  15.  Anemia of chronic disease?:  CBC ordered.  Bary Leriche, PA-C 10/07/2019  I have  personally performed a face to face diagnostic evaluation, including, but not limited to relevant history and physical exam findings, of this patient and developed relevant assessment and plan.  Additionally, I have reviewed and concur with the physician assistant's documentation above.  Delice Lesch, MD, ABPMR  The patient's status has not changed. The original post admission physician evaluation remains appropriate, and any changes from the pre-admission screening or documentation from the acute chart are noted above.   Delice Lesch, MD, ABPMR

## 2019-10-07 NOTE — Progress Notes (Signed)
Physical Therapy Treatment Patient Details Name: Nathan Proctor. MRN: [id number] DOB: 10/03/1940 Today's Date: 10/07/2019    History of Present Illness Nathan Chelf. is a 79 y.o. male who has had a past medical history of diabetes, prostate cancer, CKD 3, diabetic neuropathy, morbid obesity, hypertension, hyperlipidemia, presenting with sudden onset of right arm weakness and slurred speech; MRI reported: "Areas of acute infarction affecting cortex in the left parietal region consistent with left MCA branch vessel territory infarction."      PT Comments    Pt more cognitively appropriate today, A&Ox4 with no fogginess or "wooziness" until end of session with exertion. Pt ambulated short, repeated distance and accepted min challenges to gait with head turns and directional changes, LOB x1 requiring PT assist to recover. Pt with tachycardia with mobility, requiring PT-directed standing and seated rest breaks intermittently, but no VTach alarms noted. Pt tolerated seated and standing balance interventions well today, with increased time and effort as well as multimodal cuing to perform. PT to continue to follow acutely.     Follow Up Recommendations  CIR;Supervision/Assistance - 24 hour     Equipment Recommendations  Rolling walker with 5" wheels    Recommendations for Other Services       Precautions / Restrictions Precautions Precautions: Fall Restrictions Weight Bearing Restrictions: No    Mobility  Bed Mobility Overal bed mobility: Needs Assistance Bed Mobility: Supine to Sit     Supine to sit: Min assist     General bed mobility comments: Min assist for trunk elevation for pt to bring RUE forward, assist for scooting to EOB. Increased time and effort.  Transfers Overall transfer level: Needs assistance Equipment used: None Transfers: Sit to/from Stand Sit to Stand: Min assist         General transfer comment: Min assist for initial power up and  steadying upon standing; sit to stand x4, from EOB x2, hallway chair, and recliner.  Ambulation/Gait Ambulation/Gait assistance: Min assist;Min guard Gait Distance (Feet): 75 Feet(75+30+50+55) Assistive device: None Gait Pattern/deviations: Wide base of support;Step-through pattern;Decreased stride length;Drifts right/left Gait velocity: decr   General Gait Details: Min guard for safety, occasional min assist to steady. Dynamic balance challenged, pt with mild to moderate difficulty accepting challenge (see balance section). Increased lateral leaning bilaterally with each step, LOB requiring PT assist to correct x1 with distraction in hallway.   Stairs             Wheelchair Mobility    Modified Rankin (Stroke Patients Only) Modified Rankin (Stroke Patients Only) Pre-Morbid Rankin Score: No symptoms Modified Rankin: Moderately severe disability     Balance Overall balance assessment: Needs assistance Sitting-balance support: No upper extremity supported;Feet supported Sitting balance-Leahy Scale: Fair     Standing balance support: No upper extremity supported;During functional activity Standing balance-Leahy Scale: Fair Standing balance comment: accepts mild challenge to gait             High level balance activites: Head turns;Turns;Direction changes High Level Balance Comments: Mild deviations to gait path with horizontal head turning, LOB with distraction and head turn in hallway            Cognition Arousal/Alertness: Awake/alert Behavior During Therapy: WFL for tasks assessed/performed Overall Cognitive Status: Impaired/Different from baseline Area of Impairment: Attention;Memory;Following commands;Safety/judgement;Problem solving                   Current Attention Level: Sustained Memory: Decreased short-term memory Following Commands: Follows one step commands with  increased time Safety/Judgement: Decreased awareness of safety;Decreased  awareness of deficits Awareness: Emergent Problem Solving: Slow processing;Requires verbal cues;Requires tactile cues;Decreased initiation;Difficulty sequencing General Comments: A&Ox4 this day, with increased time required to respond to questions. Multimodal cuing and demonstration required to participate in tasks.      Exercises Other Exercises Other Exercises: Lateral forearm propping to upright, x5 each direction Other Exercises: AP leanining in recliner, x10, without use of UEs Other Exercises: forward, diagonal downward reaching with CL UE (cue "touch your R hand to your L foot") x10 each side    General Comments General comments (skin integrity, edema, etc.): HRmax 133 bpm, no VTACH seen during gait. PT encouraged rest breaks with tachycardia, x1 seated rest break and x2 standing rest breaks      Pertinent Vitals/Pain Pain Assessment: No/denies pain Pain Intervention(s): Limited activity within patient's tolerance;Monitored during session    Home Living                      Prior Function            PT Goals (current goals can now be found in the care plan section) Acute Rehab PT Goals Patient Stated Goal: Agreeable to therapy PT Goal Formulation: With patient Time For Goal Achievement: 10/18/19 Potential to Achieve Goals: Good Progress towards PT goals: Progressing toward goals    Frequency    Min 4X/week      PT Plan Current plan remains appropriate    Co-evaluation              AM-PAC PT "6 Clicks" Mobility   Outcome Measure  Help needed turning from your back to your side while in a flat bed without using bedrails?: A Little Help needed moving from lying on your back to sitting on the side of a flat bed without using bedrails?: A Little Help needed moving to and from a bed to a chair (including a wheelchair)?: A Little Help needed standing up from a chair using your arms (e.g., wheelchair or bedside chair)?: A Little Help needed to walk  in hospital room?: A Little Help needed climbing 3-5 steps with a railing? : A Lot 6 Click Score: 17    End of Session Equipment Utilized During Treatment: Gait belt Activity Tolerance: Patient limited by fatigue Patient left: with call bell/phone within reach;in chair;with chair alarm set Nurse Communication: Mobility status PT Visit Diagnosis: Unsteadiness on feet (R26.81);Other abnormalities of gait and mobility (R26.89);Hemiplegia and hemiparesis Hemiplegia - Right/Left: Right Hemiplegia - dominant/non-dominant: Dominant Hemiplegia - caused by: Cerebral infarction     Time: 0830-0858 PT Time Calculation (min) (ACUTE ONLY): 28 min  Charges:  $Gait Training: 8-22 mins $Neuromuscular Re-education: 8-22 mins                    Jaritza Duignan E, [place] Pager [phone]  Office [phone]   Shelisha Gautier D Cathryn Gallery 10/07/2019, 10:58 AM

## 2019-10-07 NOTE — Progress Notes (Signed)
Progress Note  Patient Name: Nathan Proctor. Date of Encounter: 10/07/2019  [facility] Cardiologist: New   Subjective   NO CP   NO SOB   Inpatient Medications    Scheduled Meds: . aspirin EC  81 mg Oral Daily  . atorvastatin  80 mg Oral Daily  . carvedilol  12.5 mg Oral BID WC  . clopidogrel  75 mg Oral Daily  . heparin injection (subcutaneous)  5,000 Units Subcutaneous Q8H  . insulin aspart  0-5 Units Subcutaneous QHS  . insulin aspart  0-9 Units Subcutaneous TID WC   Continuous Infusions:  PRN Meds:    Vital Signs    Vitals:   10/06/19 0741 10/06/19 1635 10/06/19 2314 10/07/19 0800  BP: (!) 151/70 (!) 119/103 131/63 132/64  Pulse: 75 91 70 80  Resp: 18 18 18  (!) 21  Temp: 98.9 F (37.2 C) 98.7 F (37.1 C) 98.4 F (36.9 C) 98.3 F (36.8 C)  TempSrc:   Oral   SpO2: 95% 99% 96% 99%  Weight:      Height:       No intake or output data in the 24 hours ending 10/07/19 1107 Last 3 Weights 10/04/2019 10/03/2019 04/04/2016  Weight (lbs) 312 lb 2.7 oz 313 lb 15 oz 345 lb  Weight (kg) 141.6 kg 142.4 kg 156.491 kg      Telemetry    SR  PVC - Personally Reviewed  ECG    None - Personally Reviewed  Physical Exam   GEN: Morbidly obese 79 yo in  No acute distress.   Neck: No JVD Cardiac: RRR, no murmurs, rubs, or gallops.  Respiratory: Clear to auscultation bilaterally. GI: Soft, nontender, non-distended  MS: No edema; No deformity. Neuro:  Nonfocal  Psych: Normal affect   Labs    High Sensitivity Troponin:  No results for input(s): TROPONINIHS in the last 720 hours.    Chemistry Recent Labs  Lab 10/05/19 0243 10/06/19 0356 10/07/19 0240  NA 142 140 141  K 3.9 3.8 4.0  CL 109 105 109  CO2 25 24 26   GLUCOSE 101* 110* 106*  BUN 26* 21 24*  CREATININE 1.50* 1.36* 1.47*  CALCIUM 9.2 9.3 9.4  PROT 6.3* 6.4* 6.4*  ALBUMIN 3.4* 3.4* 3.4*  AST 14* 15 18  ALT 11 12 12   ALKPHOS 71 68 69  BILITOT 0.8 1.0 1.0  GFRNONAA 43* 49* 44*  GFRAA 50*  57* 51*  ANIONGAP 8 11 6      Hematology Recent Labs  Lab 10/05/19 0243 10/06/19 0356 10/07/19 0240  WBC 5.4 5.6 5.5  RBC 3.57* 3.62* 3.76*  HGB 11.0* 11.1* 11.6*  HCT 34.5* 34.1* 35.7*  MCV 96.6 94.2 94.9  MCH 30.8 30.7 30.9  MCHC 31.9 32.6 32.5  RDW 13.2 13.3 13.2  PLT 184 176 175    BNPNo results for input(s): BNP, PROBNP in the last 168 hours.   DDimer No results for input(s): DDIMER in the last 168 hours.   Radiology    ECHOCARDIOGRAM COMPLETE  Result Date: 10/05/2019    ECHOCARDIOGRAM REPORT   Patient Name:   Nathan Proctor. Date of Exam: 10/05/2019 Medical Rec #:  [id number]              Height:       70.0 in Accession #:    [id number]             Weight:       312.2 lb Date of  Birth:  [birth year]              BSA:          2.522 m Patient Age:    79 years               BP:           123/68 mmHg Patient Gender: M                      HR:           72 bpm. Exam Location:  Inpatient Procedure: 2D Echo and Cardiac Doppler Indications:    Stroke  History:        Patient has no prior history of Echocardiogram examinations.                 Risk Factors:Hypertension, Dyslipidemia and Former Smoker. CKD.  Sonographer:    Clayton Lefort RDCS (AE) Referring Phys: [id number] [facility] ADEFESO  Sonographer Comments: Definity use attempted. Patient reported pain at IV site with saline flush. No furthuer attempts made to use Definity. Nurse notified of patient reaction. IMPRESSIONS  1. Left ventricular ejection fraction, by estimation, is 45 to 50%. The left ventricle has normal function. The left ventricle demonstrates global hypokinesis. There is severe left ventricular hypertrophy. Left ventricular diastolic parameters are consistent with Grade I diastolic dysfunction (impaired relaxation).  2. Right ventricular systolic function is low normal. The right ventricular size is normal.  3. The mitral valve is abnormal. Trivial mitral valve regurgitation.  4. The aortic valve is tricuspid. Aortic valve  regurgitation is not visualized. Mild aortic valve sclerosis is present, with no evidence of aortic valve stenosis.  5. Aortic dilatation noted. There is moderate dilatation at the level of the sinuses of Valsalva measuring 44 mm.  6. Definity contrast not given due to possible IV infiltration FINDINGS  Left Ventricle: Left ventricular ejection fraction, by estimation, is 45 to 50%. The left ventricle has normal function. The left ventricle demonstrates global hypokinesis. The left ventricular internal cavity size was normal in size. There is severe left ventricular hypertrophy. Left ventricular diastolic parameters are consistent with Grade I diastolic dysfunction (impaired relaxation). Indeterminate filling pressures. Right Ventricle: The right ventricular size is normal. No increase in right ventricular wall thickness. Right ventricular systolic function is low normal. Left Atrium: Left atrial size was normal in size. Right Atrium: Right atrial size was normal in size. Pericardium: There is no evidence of pericardial effusion. Mitral Valve: The mitral valve is abnormal. There is mild thickening of the mitral valve leaflet(s). Mild to moderate mitral annular calcification. Trivial mitral valve regurgitation. MV peak gradient, 9.1 mmHg. The mean mitral valve gradient is 2.0 mmHg. Tricuspid Valve: The tricuspid valve is grossly normal. Tricuspid valve regurgitation is trivial. Aortic Valve: The aortic valve is tricuspid. Aortic valve regurgitation is not visualized. Mild aortic valve sclerosis is present, with no evidence of aortic valve stenosis. Aortic valve mean gradient measures 3.0 mmHg. Aortic valve peak gradient measures 5.4 mmHg. Aortic valve area, by VTI measures 2.70 cm. Pulmonic Valve: The pulmonic valve was normal in structure. Pulmonic valve regurgitation is not visualized. Aorta: Aortic dilatation noted. There is moderate dilatation at the level of the sinuses of Valsalva measuring 44 mm. Venous: The  inferior vena cava was not well visualized. IAS/Shunts: No atrial level shunt detected by color flow Doppler.  LEFT VENTRICLE PLAX 2D LVIDd:         4.33 cm  Diastology LVIDs:         3.33 cm  LV e' lateral:   5.56 cm/s LV PW:         2.29 cm  LV E/e' lateral: 12.2 LV IVS:        1.80 cm  LV e' medial:    3.75 cm/s LVOT diam:     2.50 cm  LV E/e' medial:  18.1 LV SV:         68 LV SV Index:   27 LVOT Area:     4.91 cm  RIGHT VENTRICLE RV S prime:     10.60 cm/s TAPSE (M-mode): 2.1 cm LEFT ATRIUM             Index       RIGHT ATRIUM           Index LA diam:        3.00 cm 1.19 cm/m  RA Area:     16.90 cm LA Vol (A2C):   95.8 ml 37.98 ml/m RA Volume:   40.20 ml  15.94 ml/m LA Vol (A4C):   64.6 ml 25.61 ml/m LA Biplane Vol: 82.0 ml 32.51 ml/m  AORTIC VALVE AV Area (Vmax):    3.45 cm AV Area (Vmean):   3.53 cm AV Area (VTI):     2.70 cm AV Vmax:           116.00 cm/s AV Vmean:          74.400 cm/s AV VTI:            0.253 m AV Peak Grad:      5.4 mmHg AV Mean Grad:      3.0 mmHg LVOT Vmax:         81.50 cm/s LVOT Vmean:        53.500 cm/s LVOT VTI:          0.139 m LVOT/AV VTI ratio: 0.55  AORTA Ao Root diam: 4.40 cm MITRAL VALVE MV Area (PHT): 3.30 cm     SHUNTS MV Peak grad:  9.1 mmHg     Systemic VTI:  0.14 m MV Mean grad:  2.0 mmHg     Systemic Diam: 2.50 cm MV Vmax:       1.51 m/s MV Vmean:      63.9 cm/s MV Decel Time: 230 msec MV E velocity: 67.70 cm/s MV A velocity: 127.00 cm/s MV E/A ratio:  0.53 Lyman Bishop MD Electronically signed by Lyman Bishop MD Signature Date/Time: 10/05/2019/4:11:54 PM    Final    ECHOCARDIOGRAM LIMITED  Result Date: 10/06/2019    ECHOCARDIOGRAM LIMITED REPORT   Patient Name:   Alby Schwabe. Date of Exam: 10/06/2019 Medical Rec #:  [id number]              Height:       70.0 in Accession #:    [id number]             Weight:       312.2 lb Date of Birth:  09/20/1940              BSA:          2.522 m Patient Age:    33 years               BP:           151/70 mmHg  Patient Gender: M  HR:           75 bpm. Exam Location:  Inpatient Procedure: Limited Echo and Intracardiac Opacification Agent STAT ECHO Indications:    Stroke 434.91 / I163.9  History:        Patient has prior history of Echocardiogram examinations, most                 recent 10/05/2019. Stroke; Risk Factors:Hypertension, Former                 Smoker and Dyslipidemia. CKD (chronic kidney disease), stage                 III.  Sonographer:    Leavy Cella Referring Phys: [id number] Santos Hardwick V Keylon Labelle IMPRESSIONS  1. Suboptimal imaging even after contrast administration. Left ventricular ejection fraction, by estimation, is 40 to 45%. The left ventricle has mildly decreased function. Global hypokinesis.  2. Right ventricular systolic function is normal. The right ventricular size is normal. FINDINGS  Left Ventricle: Left ventricular ejection fraction, by estimation, is 40 to 45%. The left ventricle has mildly decreased function. The left ventricle demonstrates global hypokinesis. Definity contrast agent was given IV to delineate the left ventricular  endocardial borders. Right Ventricle: The right ventricular size is normal. Right ventricular systolic function is normal. Oswaldo Milian MD Electronically signed by Oswaldo Milian MD Signature Date/Time: 10/06/2019/9:09:25 PM    Final     Cardiac Studies     Patient Profile     79 y.o. male admitted with CVA   Asked to see re NSVT   Assessment & Plan    1   NSVT   Epsiode yesterday 21 beat   Pt asymptomatic     Agree with Rx with b blocker   He is tolerating  Would set up for 3 wk Zio patch  2  Cardiomyopathy    LVEF is down   I have reviewed echo.  Even with definity it is difficult    I am not convinced it is as hypertrophied as noted on initial echo.   LVEF is probably mildly depressed     Pt will need f/u eval as outpt once recovered for CVA  OK to sent to rehab.  For questions or updates, please contact [place] Please consult [email or web address] for contact info under        Signed, Dorris Carnes, MD  10/07/2019, 11:07 AM

## 2019-10-07 NOTE — TOC CAGE-AID Note (Signed)
Transition of Care [facility]) - CAGE-AID Screening   Patient Details  Name: Nathan Proctor. MRN: [id number] Date of Birth: [birth year]  [facility] CM/SW Contact:    Emeterio Reeve, [place] Phone Number: 10/07/2019, 3:41 PM   Clinical Narrative: Pt denied alcohol use and substance use.    CAGE-AID Screening:    Have You Ever Felt You Ought to Cut Down on Your Drinking or Drug Use?: No Have People Annoyed You By Critizing Your Drinking Or Drug Use?: No Have You Felt Bad Or Guilty About Your Drinking Or Drug Use?: No Have You Ever Had a Drink or Used Drugs First Thing In The Morning to STeady Your Nerves or to Get Rid of a Hangover?: No CAGE-AID Score: 0  Substance Abuse Education Offered: No     Blima Ledger, [place] Social Worker [phone]

## 2019-10-07 NOTE — Progress Notes (Signed)
Discussed personally with patient.  With EF 40-45% and NSVT, would like further work up before considering for LOOP as he may require other therapies.   Will plan 30 day event monitor to look for further NSVT and for AF in the setting of cryptogenic stroke.   He will follow up with me in the office in 6 weeks following placement of the monitor to discuss results. (He will receive monitor once discharged from CIR)  All above discussed with Dr. Curt Bears.   Legrand Como [address] [place], PA-C  10/07/2019 2:10 PM

## 2019-10-07 NOTE — Progress Notes (Signed)
Patient admitted to 4w23 A&Ox3 patient oriented to unit, room, fall and masking/ visitor policy.

## 2019-10-07 NOTE — Progress Notes (Signed)
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 Note that insurance prior authorization may be required for reimbursement for recommended care.   Comment:      Bary Leriche, PA-C 10/05/2019    "I have personally performed a face to face diagnostic evaluation of this patient.  Additionally, I have reviewed and concur with the physician assistant's documentation above." Charlett Blake M.D. [facility] FAAPM&R (Neuromuscular Med) Diplomate Am Board of Electrodiagnostic Med Fellow Am Board of Interventional Pain            Revision History                     Routing History

## 2019-10-07 NOTE — Discharge Summary (Signed)
Physician Discharge Summary  Nathan Proctor. XHB:[id number] DOB: 1940-09-30 DOA: 10/03/2019  PCP: Nolene Ebbs, MD  Admit date: 10/03/2019 Discharge date: 10/07/2019  Admitted From: Home  Disposition:  CIR   Recommendations for Outpatient Follow-up:  1. Follow up with PCP Dr. Jeanie Cooks in 1-2 weeks after discharge from SNF 2. Follow up with Neurology Dr. Leonie Man in 4-6 weeks 3. Follow up with Cardiology in 4-6 weeks     Home Health: N/A  Equipment/Devices: TBD at [facility]  Discharge Condition: Improving  CODE STATUS: FULL Diet recommendation: Cardiac  Brief/Interim Summary: Nathan Proctor is an 79 y.o. M with DM, CKD IIIa, obesity, and HTN who presented with acute onset right sided weakness.  In the ER, CT head unremarkable, MRI brain showed acute stroke.       PRINCIPAL HOSPITAL DIAGNOSIS: Acute ischemic stroke    Discharge Diagnoses:   Acute ischemic stroke MRI brain showed areas of acute infarction in the left parietal region consistent with left MCA territory infarction. -Non-invasive angiography showed disease -Echocardiogram showed no cardiogenic source of embolism but EF 45 to 50%  -Lipids ordered: discharged on atorvastatin 80 mg -Aspirin ordered at admission --> discharged on aspirin and Plavix for 3 weeks, followed by aspirin alone -Atrial fibrillation: Not present on telemetry --   Patient evaluated by cardiology who recommended further cardiac work-up, office follow-up prior to likely loop recorder placement as an outpatient.  Cardiology follow-up pending.  -tPA not given because outside the window -Dysphagia screen ordered in ER -PT eval ordered: recommended inpatient rehab -Smoking cessation: Not applicable   Diabetes Controlled well with sliding scale corrections only here, last 7030 units of 70/30 twice daily that he uses at home.  Given hypoglycemia here, will defer to inpatient rehab to adjust his antiglycemics.  Nonsustained ventricular  tachycardia Continue Coreg.  Follow-up with electrophysiology as an outpatient.  Chronic systolic and diastolic CHF EF noted to have 45 to 30% EF with global hypokinesis, grade 1 diastolic dysfunction.  No evidence of fluid overload  CKD IIIa Stable  Morbid obesity BMI 44  Hypertension Amlodipine held.  Carvedilol continued at discharge.                 Discharge Instructions  Discharge Instructions    Diet - low sodium heart healthy   Complete by: As directed    Discharge instructions   Complete by: As directed    From Drs. Marcelline Deist and Kathrine Rieves: You were admitted for a stroke You should take baby aspirin and clopidogrel/Plavix together for 3 weeks, and after that take aspirin 81 mg once daily from now on  You should continue your Coreg 25 mg twice daily You should reduce your dose of amlodipine and see your primary care doctor in 1 week to see if Dr. Jeanie Cooks wants to adjust your medications  The Cardiology office will contact you about a heart monitor once you get home  If you don't have an appointment with a Cardiologist already listed below, Call the heart specialist/Cardiologist Dr. Harrington Challenger whom you saw in the hospital for a follow up appointment You need to have a cardiology follow up appointment in 4-6 weeks   Increase activity slowly   Complete by: As directed    Reason for NOT prescribing anticoagulant therapy at discharge   Complete by: As directed    Reason for not prescribing antIcoagulant at discharge?: Other (Type in Comment field on line #2)   Comment: patient did not have embolic stroke  Allergies as of 10/07/2019   No Known Allergies     Medication List    TAKE these medications   acetaminophen 500 MG tablet Commonly known as: TYLENOL Take 1,000 mg by mouth every 8 (eight) hours as needed for mild pain.   allopurinol 100 MG tablet Commonly known as: ZYLOPRIM Take 100 mg by mouth daily.   amLODipine 5 MG tablet Commonly known as:  NORVASC Take 1 tablet (5 mg total) by mouth daily. What changed:   medication strength  how much to take   aspirin 81 MG EC tablet Take 1 tablet (81 mg total) by mouth daily. Start taking on: October 08, 2019   atorvastatin 80 MG tablet Commonly known as: LIPITOR Take 1 tablet (80 mg total) by mouth daily. Start taking on: October 08, 2019 What changed:   medication strength  how much to take   carvedilol 25 MG tablet Commonly known as: COREG Take 25 mg by mouth daily.   clopidogrel 75 MG tablet Commonly known as: PLAVIX Take 1 tablet (75 mg total) by mouth daily for 21 days. Start taking on: October 08, 2019   gabapentin 300 MG capsule Commonly known as: NEURONTIN Take 300 mg by mouth 2 (two) times daily.   insulin NPH-regular Human (70-30) 100 UNIT/ML injection Inject 30 Units into the skin daily.   linaclotide 145 MCG Caps capsule Commonly known as: LINZESS Take 145 mcg by mouth daily as needed (constipation.).   tiZANidine 4 MG tablet Commonly known as: ZANAFLEX Take 4 mg by mouth 2 (two) times daily as needed for muscle spasms.   Vitamin D (Ergocalciferol) 1.25 MG (50000 UNIT) Caps capsule Commonly known as: DRISDOL Take 50,000 Units by mouth every 7 (seven) days.      Follow-up Information    Nolene Ebbs, MD. Schedule an appointment as soon as possible for a visit.   Specialty: Internal Medicine Why: make an appointment for 1 week after rehab discharge Contact information: [place] [address] [phone]        Fay Records, MD. Schedule an appointment as soon as possible for a visit in 1 month(s).   Specialty: Cardiology Contact information: [place] [address] [phone]          No Known Allergies  Consultations:  Neurology  Cardiology  Electrophysiology   Procedures/Studies: MR ANGIO HEAD WO CONTRAST  Result Date: 10/04/2019 CLINICAL DATA:  Right arm weakness and  numbness. Negative CT evaluation yesterday. EXAM: MRI HEAD WITHOUT CONTRAST MRA HEAD WITHOUT CONTRAST MRA NECK WITHOUT AND WITH CONTRAST TECHNIQUE: Multiplanar, multiecho pulse sequences of the brain and surrounding structures were obtained without and with intravenous contrast. Angiographic images of the Circle of Willis were obtained using MRA technique without intravenous contrast. Angiographic images of the neck were obtained using MRA technique without and with intravenous contrast. Carotid stenosis measurements (when applicable) are obtained utilizing NASCET criteria, using the distal internal carotid diameter as the denominator. CONTRAST:  58mL GADAVIST GADOBUTROL 1 MMOL/ML IV SOLN COMPARISON:  Head CT 10/03/2019 FINDINGS: MRI HEAD FINDINGS Brain: Diffusion imaging shows scattered acute infarctions affecting the left parietal cortex. No large confluent infarction. No evidence of swelling or hemorrhage. Elsewhere, the brainstem and cerebellum are normal. Cerebral hemispheres show minimal changes of small vessel disease affecting the white matter. No mass, hydrocephalus or extra-axial collection. Vascular: Major vessels at the base of the brain show flow. Skull and upper cervical spine: Negative Sinuses/Orbits: Clear/normal Other: None MRA HEAD  FINDINGS Both internal carotid arteries are patent through the skull base and siphon regions. There is mild siphon atherosclerotic irregularity but no flow limiting stenosis. Probable 2 mm periophthalmic artery aneurysm on the left. The anterior and middle cerebral vessels are patent without proximal stenosis, aneurysm or vascular malformation. No missing large or medium-sized branch vessels are identified. Both vertebral arteries are widely patent to the basilar. No basilar stenosis. Posterior circulation branch vessels are normal. MRA NECK FINDINGS Both common carotid arteries are widely patent to the bifurcation. Both carotid bifurcations appear widely patent without  flow limiting stenosis. Smooth atherosclerotic plaque is present in the ICA bulb on the left, but narrowing is no more than 30%. Both vertebral arteries are patent with the left being dominant. Possible narrowing of the vertebral artery origins. Limited detail due to breathing motion. IMPRESSION: Areas of acute infarction affecting cortex in the left parietal region consistent with left MCA branch vessel territory infarction. Underlying white matter is largely spared. No swelling or hemorrhage. Mild chronic small-vessel change elsewhere affecting the cerebral hemispheric white matter. Smooth plaque affecting the ICA bulb on the left with stenosis of 30%. Cannot rule out vertebral artery origin narrowing. Detail is limited because of physiologic chest motion. No intracranial large or medium vessel occlusion identified presently. 2 mm left periophthalmic aneurysm. Electronically Signed   By: Nelson Chimes M.D.   On: 10/04/2019 07:45   MR ANGIO NECK W WO CONTRAST  Result Date: 10/04/2019 CLINICAL DATA:  Right arm weakness and numbness. Negative CT evaluation yesterday. EXAM: MRI HEAD WITHOUT CONTRAST MRA HEAD WITHOUT CONTRAST MRA NECK WITHOUT AND WITH CONTRAST TECHNIQUE: Multiplanar, multiecho pulse sequences of the brain and surrounding structures were obtained without and with intravenous contrast. Angiographic images of the Circle of Willis were obtained using MRA technique without intravenous contrast. Angiographic images of the neck were obtained using MRA technique without and with intravenous contrast. Carotid stenosis measurements (when applicable) are obtained utilizing NASCET criteria, using the distal internal carotid diameter as the denominator. CONTRAST:  25mL GADAVIST GADOBUTROL 1 MMOL/ML IV SOLN COMPARISON:  Head CT 10/03/2019 FINDINGS: MRI HEAD FINDINGS Brain: Diffusion imaging shows scattered acute infarctions affecting the left parietal cortex. No large confluent infarction. No evidence of swelling  or hemorrhage. Elsewhere, the brainstem and cerebellum are normal. Cerebral hemispheres show minimal changes of small vessel disease affecting the white matter. No mass, hydrocephalus or extra-axial collection. Vascular: Major vessels at the base of the brain show flow. Skull and upper cervical spine: Negative Sinuses/Orbits: Clear/normal Other: None MRA HEAD FINDINGS Both internal carotid arteries are patent through the skull base and siphon regions. There is mild siphon atherosclerotic irregularity but no flow limiting stenosis. Probable 2 mm periophthalmic artery aneurysm on the left. The anterior and middle cerebral vessels are patent without proximal stenosis, aneurysm or vascular malformation. No missing large or medium-sized branch vessels are identified. Both vertebral arteries are widely patent to the basilar. No basilar stenosis. Posterior circulation branch vessels are normal. MRA NECK FINDINGS Both common carotid arteries are widely patent to the bifurcation. Both carotid bifurcations appear widely patent without flow limiting stenosis. Smooth atherosclerotic plaque is present in the ICA bulb on the left, but narrowing is no more than 30%. Both vertebral arteries are patent with the left being dominant. Possible narrowing of the vertebral artery origins. Limited detail due to breathing motion. IMPRESSION: Areas of acute infarction affecting cortex in the left parietal region consistent with left MCA branch vessel territory infarction. Underlying white matter is  largely spared. No swelling or hemorrhage. Mild chronic small-vessel change elsewhere affecting the cerebral hemispheric white matter. Smooth plaque affecting the ICA bulb on the left with stenosis of 30%. Cannot rule out vertebral artery origin narrowing. Detail is limited because of physiologic chest motion. No intracranial large or medium vessel occlusion identified presently. 2 mm left periophthalmic aneurysm. Electronically Signed   By: Nelson Chimes M.D.   On: 10/04/2019 07:45   MR BRAIN WO CONTRAST  Result Date: 10/04/2019 CLINICAL DATA:  Right arm weakness and numbness. Negative CT evaluation yesterday. EXAM: MRI HEAD WITHOUT CONTRAST MRA HEAD WITHOUT CONTRAST MRA NECK WITHOUT AND WITH CONTRAST TECHNIQUE: Multiplanar, multiecho pulse sequences of the brain and surrounding structures were obtained without and with intravenous contrast. Angiographic images of the Circle of Willis were obtained using MRA technique without intravenous contrast. Angiographic images of the neck were obtained using MRA technique without and with intravenous contrast. Carotid stenosis measurements (when applicable) are obtained utilizing NASCET criteria, using the distal internal carotid diameter as the denominator. CONTRAST:  63mL GADAVIST GADOBUTROL 1 MMOL/ML IV SOLN COMPARISON:  Head CT 10/03/2019 FINDINGS: MRI HEAD FINDINGS Brain: Diffusion imaging shows scattered acute infarctions affecting the left parietal cortex. No large confluent infarction. No evidence of swelling or hemorrhage. Elsewhere, the brainstem and cerebellum are normal. Cerebral hemispheres show minimal changes of small vessel disease affecting the white matter. No mass, hydrocephalus or extra-axial collection. Vascular: Major vessels at the base of the brain show flow. Skull and upper cervical spine: Negative Sinuses/Orbits: Clear/normal Other: None MRA HEAD FINDINGS Both internal carotid arteries are patent through the skull base and siphon regions. There is mild siphon atherosclerotic irregularity but no flow limiting stenosis. Probable 2 mm periophthalmic artery aneurysm on the left. The anterior and middle cerebral vessels are patent without proximal stenosis, aneurysm or vascular malformation. No missing large or medium-sized branch vessels are identified. Both vertebral arteries are widely patent to the basilar. No basilar stenosis. Posterior circulation branch vessels are normal. MRA NECK  FINDINGS Both common carotid arteries are widely patent to the bifurcation. Both carotid bifurcations appear widely patent without flow limiting stenosis. Smooth atherosclerotic plaque is present in the ICA bulb on the left, but narrowing is no more than 30%. Both vertebral arteries are patent with the left being dominant. Possible narrowing of the vertebral artery origins. Limited detail due to breathing motion. IMPRESSION: Areas of acute infarction affecting cortex in the left parietal region consistent with left MCA branch vessel territory infarction. Underlying white matter is largely spared. No swelling or hemorrhage. Mild chronic small-vessel change elsewhere affecting the cerebral hemispheric white matter. Smooth plaque affecting the ICA bulb on the left with stenosis of 30%. Cannot rule out vertebral artery origin narrowing. Detail is limited because of physiologic chest motion. No intracranial large or medium vessel occlusion identified presently. 2 mm left periophthalmic aneurysm. Electronically Signed   By: Nelson Chimes M.D.   On: 10/04/2019 07:45   ECHOCARDIOGRAM COMPLETE  Result Date: 10/05/2019    ECHOCARDIOGRAM REPORT   Patient Name:   Nathan Proctor. Date of Exam: 10/05/2019 Medical Rec #:  [id number]              Height:       70.0 in Accession #:    [id number]             Weight:       312.2 lb Date of Birth:  July 05, 1940  BSA:          2.522 m Patient Age:    39 years               BP:           123/68 mmHg Patient Gender: M                      HR:           72 bpm. Exam Location:  Inpatient Procedure: 2D Echo and Cardiac Doppler Indications:    Stroke  History:        Patient has no prior history of Echocardiogram examinations.                 Risk Factors:Hypertension, Dyslipidemia and Former Smoker. CKD.  Sonographer:    Clayton Lefort RDCS (AE) Referring Phys: [id number] [facility] ADEFESO  Sonographer Comments: Definity use attempted. Patient reported pain at IV site with saline  flush. No furthuer attempts made to use Definity. Nurse notified of patient reaction. IMPRESSIONS  1. Left ventricular ejection fraction, by estimation, is 45 to 50%. The left ventricle has normal function. The left ventricle demonstrates global hypokinesis. There is severe left ventricular hypertrophy. Left ventricular diastolic parameters are consistent with Grade I diastolic dysfunction (impaired relaxation).  2. Right ventricular systolic function is low normal. The right ventricular size is normal.  3. The mitral valve is abnormal. Trivial mitral valve regurgitation.  4. The aortic valve is tricuspid. Aortic valve regurgitation is not visualized. Mild aortic valve sclerosis is present, with no evidence of aortic valve stenosis.  5. Aortic dilatation noted. There is moderate dilatation at the level of the sinuses of Valsalva measuring 44 mm.  6. Definity contrast not given due to possible IV infiltration FINDINGS  Left Ventricle: Left ventricular ejection fraction, by estimation, is 45 to 50%. The left ventricle has normal function. The left ventricle demonstrates global hypokinesis. The left ventricular internal cavity size was normal in size. There is severe left ventricular hypertrophy. Left ventricular diastolic parameters are consistent with Grade I diastolic dysfunction (impaired relaxation). Indeterminate filling pressures. Right Ventricle: The right ventricular size is normal. No increase in right ventricular wall thickness. Right ventricular systolic function is low normal. Left Atrium: Left atrial size was normal in size. Right Atrium: Right atrial size was normal in size. Pericardium: There is no evidence of pericardial effusion. Mitral Valve: The mitral valve is abnormal. There is mild thickening of the mitral valve leaflet(s). Mild to moderate mitral annular calcification. Trivial mitral valve regurgitation. MV peak gradient, 9.1 mmHg. The mean mitral valve gradient is 2.0 mmHg. Tricuspid Valve: The  tricuspid valve is grossly normal. Tricuspid valve regurgitation is trivial. Aortic Valve: The aortic valve is tricuspid. Aortic valve regurgitation is not visualized. Mild aortic valve sclerosis is present, with no evidence of aortic valve stenosis. Aortic valve mean gradient measures 3.0 mmHg. Aortic valve peak gradient measures 5.4 mmHg. Aortic valve area, by VTI measures 2.70 cm. Pulmonic Valve: The pulmonic valve was normal in structure. Pulmonic valve regurgitation is not visualized. Aorta: Aortic dilatation noted. There is moderate dilatation at the level of the sinuses of Valsalva measuring 44 mm. Venous: The inferior vena cava was not well visualized. IAS/Shunts: No atrial level shunt detected by color flow Doppler.  LEFT VENTRICLE PLAX 2D LVIDd:         4.33 cm  Diastology LVIDs:         3.33 cm  LV e' lateral:  5.56 cm/s LV PW:         2.29 cm  LV E/e' lateral: 12.2 LV IVS:        1.80 cm  LV e' medial:    3.75 cm/s LVOT diam:     2.50 cm  LV E/e' medial:  18.1 LV SV:         68 LV SV Index:   27 LVOT Area:     4.91 cm  RIGHT VENTRICLE RV S prime:     10.60 cm/s TAPSE (M-mode): 2.1 cm LEFT ATRIUM             Index       RIGHT ATRIUM           Index LA diam:        3.00 cm 1.19 cm/m  RA Area:     16.90 cm LA Vol (A2C):   95.8 ml 37.98 ml/m RA Volume:   40.20 ml  15.94 ml/m LA Vol (A4C):   64.6 ml 25.61 ml/m LA Biplane Vol: 82.0 ml 32.51 ml/m  AORTIC VALVE AV Area (Vmax):    3.45 cm AV Area (Vmean):   3.53 cm AV Area (VTI):     2.70 cm AV Vmax:           116.00 cm/s AV Vmean:          74.400 cm/s AV VTI:            0.253 m AV Peak Grad:      5.4 mmHg AV Mean Grad:      3.0 mmHg LVOT Vmax:         81.50 cm/s LVOT Vmean:        53.500 cm/s LVOT VTI:          0.139 m LVOT/AV VTI ratio: 0.55  AORTA Ao Root diam: 4.40 cm MITRAL VALVE MV Area (PHT): 3.30 cm     SHUNTS MV Peak grad:  9.1 mmHg     Systemic VTI:  0.14 m MV Mean grad:  2.0 mmHg     Systemic Diam: 2.50 cm MV Vmax:       1.51 m/s MV Vmean:       63.9 cm/s MV Decel Time: 230 msec MV E velocity: 67.70 cm/s MV A velocity: 127.00 cm/s MV E/A ratio:  0.53 Lyman Bishop MD Electronically signed by Lyman Bishop MD Signature Date/Time: 10/05/2019/4:11:54 PM    Final    CT HEAD CODE STROKE WO CONTRAST  Result Date: 10/03/2019 CLINICAL DATA:  Code stroke. Possible stroke, neuro deficit, acute, stroke suspected. Additional history obtained from [place] arm weakness, numbness and neglect, last known normal 10/02/2019 at 23:00 EXAM: CT HEAD WITHOUT CONTRAST TECHNIQUE: Contiguous axial images were obtained from the base of the skull through the vertex without intravenous contrast. COMPARISON:  No pertinent prior studies available for comparison. FINDINGS: Brain: There is mild generalized parenchymal atrophy. Mild ill-defined hypoattenuation within the cerebral white matter is nonspecific, but consistent with chronic small vessel ischemic disease. There is no acute intracranial hemorrhage. No demarcated cortical infarct is identified. No extra-axial fluid collection. No evidence of intracranial mass. No midline shift. Vascular: No hyperdense vessel.  Atherosclerotic calcifications Skull: Normal. Negative for fracture or focal lesion. Sinuses/Orbits: Visualized orbits show no acute finding. No significant paranasal sinus disease or mastoid effusion at the imaged levels. ASPECTS [facility] Stroke Program Early CT Score) - Ganglionic level infarction (caudate, lentiform nuclei, internal capsule, insula, M1-M3 cortex): 7 - Supraganglionic  infarction (M4-M6 cortex): 3 Total score (0-10 with 10 being normal): 10 These results were communicated to Dr. Rory Percy At 10:34 pmon 6/5/2021by text page via the [facility] messaging system. IMPRESSION: 1. No CT evidence of acute intracranial abnormality.  ASPECTS is 10. 2. Mild generalized parenchymal atrophy and chronic small vessel ischemic disease Electronically Signed   By: Kellie Simmering DO   On: 10/03/2019 22:34    ECHOCARDIOGRAM LIMITED  Result Date: 10/06/2019    ECHOCARDIOGRAM LIMITED REPORT   Patient Name:   Nathan Proctor. Date of Exam: 10/06/2019 Medical Rec #:  [id number]              Height:       70.0 in Accession #:    [id number]             Weight:       312.2 lb Date of Birth:  Mar 05, 1941              BSA:          2.522 m Patient Age:    39 years               BP:           151/70 mmHg Patient Gender: M                      HR:           75 bpm. Exam Location:  Inpatient Procedure: Limited Echo and Intracardiac Opacification Agent STAT ECHO Indications:    Stroke 434.91 / I163.9  History:        Patient has prior history of Echocardiogram examinations, most                 recent 10/05/2019. Stroke; Risk Factors:Hypertension, Former                 Smoker and Dyslipidemia. CKD (chronic kidney disease), stage                 III.  Sonographer:    Leavy Cella Referring Phys: [id number] PAULA V ROSS IMPRESSIONS  1. Suboptimal imaging even after contrast administration. Left ventricular ejection fraction, by estimation, is 40 to 45%. The left ventricle has mildly decreased function. Global hypokinesis.  2. Right ventricular systolic function is normal. The right ventricular size is normal. FINDINGS  Left Ventricle: Left ventricular ejection fraction, by estimation, is 40 to 45%. The left ventricle has mildly decreased function. The left ventricle demonstrates global hypokinesis. Definity contrast agent was given IV to delineate the left ventricular  endocardial borders. Right Ventricle: The right ventricular size is normal. Right ventricular systolic function is normal. Oswaldo Milian MD Electronically signed by Oswaldo Milian MD Signature Date/Time: 10/06/2019/9:09:25 PM    Final       Subjective: Well.  He feels at his baseline.  No confusion, focal weakness, fever.  No respiratory distress.  Discharge Exam: Vitals:   10/06/19 2314 10/07/19 0800  BP: 131/63 132/64  Pulse: 70 80  Resp: 18  (!) 21  Temp: 98.4 F (36.9 C) 98.3 F (36.8 C)  SpO2: 96% 99%   Vitals:   10/06/19 0741 10/06/19 1635 10/06/19 2314 10/07/19 0800  BP: (!) 151/70 (!) 119/103 131/63 132/64  Pulse: 75 91 70 80  Resp: 18 18 18  (!) 21  Temp: 98.9 F (37.2 C) 98.7 F (37.1 C) 98.4 F (36.9 C) 98.3 F (36.8 C)  TempSrc:   Oral  SpO2: 95% 99% 96% 99%  Weight:      Height:        General: Pt is alert, awake, not in acute distress Cardiovascular: RRR, nl S1-S2, no murmurs appreciated.   No LE edema.   Respiratory: Normal respiratory rate and rhythm.  CTAB without rales or wheezes. Abdominal: Abdomen soft and non-tender.  No distension or HSM.   Neuro/Psych: Strength diminished in the right upper and lower extremities, normal on the left.  Judgment and insight appear normal.   The results of significant diagnostics from this hospitalization (including imaging, microbiology, ancillary and laboratory) are listed below for reference.     Microbiology: Recent Results (from the past 240 hour(s))  SARS Coronavirus 2 by RT PCR (hospital order, performed in [facility] hospital lab) Nasopharyngeal Nasopharyngeal Swab     Status: None   Collection Time: 10/03/19 10:41 PM   Specimen: Nasopharyngeal Swab  Result Value Ref Range Status   SARS Coronavirus 2 NEGATIVE NEGATIVE Final    Comment: (NOTE) SARS-CoV-2 target nucleic acids are NOT DETECTED. The SARS-CoV-2 RNA is generally detectable in upper and lower respiratory specimens during the acute phase of infection. The lowest concentration of SARS-CoV-2 viral copies this assay can detect is 250 copies / mL. A negative result does not preclude SARS-CoV-2 infection and should not be used as the sole basis for treatment or other patient management decisions.  A negative result may occur with improper specimen collection / handling, submission of specimen other than nasopharyngeal swab, presence of viral mutation(s) within the areas targeted by this assay,  and inadequate number of viral copies (<250 copies / mL). A negative result must be combined with clinical observations, patient history, and epidemiological information. Fact Sheet for Patients:   StrictlyIdeas.no Fact Sheet for Healthcare Providers: BankingDealers.co.za This test is not yet approved or cleared  by the [place] FDA and has been authorized for detection and/or diagnosis of SARS-CoV-2 by FDA under an Emergency Use Authorization (EUA).  This EUA will remain in effect (meaning this test can be used) for the duration of the COVID-19 declaration under Section 564(b)(1) of the Act, 21 U.S.C. section 360bbb-3(b)(1), unless the authorization is terminated or revoked sooner. Performed at [facility], [place] [address], [place], [place] [address]      Labs: BNP (last 3 results) No results for input(s): BNP in the last 8760 hours. Basic Metabolic Panel: Recent Labs  Lab 10/03/19 2217 10/03/19 2217 10/03/19 2224 10/04/19 0531 10/05/19 0243 10/06/19 0356 10/07/19 0240  NA 143   < > 145 140 142 140 141  K 4.3   < > 4.1 4.0 3.9 3.8 4.0  CL 113*   < > 111 109 109 105 109  CO2 23  --   --  24 25 24 26   GLUCOSE 66*   < > 61* 251* 101* 110* 106*  BUN 31*   < > 30* 30* 26* 21 24*  CREATININE 1.85*   < > 1.90* 1.59* 1.50* 1.36* 1.47*  CALCIUM 9.1  --   --  9.1 9.2 9.3 9.4  MG  --   --   --  1.9 1.9 1.8 1.9  PHOS  --   --   --  3.2 3.3 3.0 3.1   < > = values in this interval not displayed.   Liver Function Tests: Recent Labs  Lab 10/03/19 2217 10/04/19 0531 10/05/19 0243 10/06/19 0356 10/07/19 0240  AST 15 16 14* 15 18  ALT 12  12 11 12 12   ALKPHOS 68 68 71 68 69  BILITOT 1.1 0.8 0.8 1.0 1.0  PROT 6.5 6.1* 6.3* 6.4* 6.4*  ALBUMIN 3.7 3.2* 3.4* 3.4* 3.4*   No results for input(s): LIPASE, AMYLASE in the last 168 hours. No results for input(s): AMMONIA in the last 168 hours. CBC: Recent Labs  Lab  10/03/19 2217 10/03/19 2217 10/03/19 2224 10/04/19 0531 10/05/19 0243 10/06/19 0356 10/07/19 0240  WBC 6.8  --   --  5.4 5.4 5.6 5.5  NEUTROABS 4.7  --   --   --  2.8 3.4 3.2  HGB 11.2*   < > 11.2* 10.9* 11.0* 11.1* 11.6*  HCT 36.6*   < > 33.0* 35.0* 34.5* 34.1* 35.7*  MCV 101.4*  --   --  97.5 96.6 94.2 94.9  PLT 180  --   --  167 184 176 175   < > = values in this interval not displayed.   Cardiac Enzymes: No results for input(s): CKTOTAL, CKMB, CKMBINDEX, TROPONINI in the last 168 hours. BNP: Invalid input(s): POCBNP CBG: Recent Labs  Lab 10/06/19 0740 10/06/19 1143 10/06/19 1635 10/06/19 2103 10/07/19 0758  GLUCAP 115* 166* 127* 231* 115*   D-Dimer No results for input(s): DDIMER in the last 72 hours. Hgb A1c No results for input(s): HGBA1C in the last 72 hours. Lipid Profile No results for input(s): CHOL, HDL, LDLCALC, TRIG, CHOLHDL, LDLDIRECT in the last 72 hours. Thyroid function studies No results for input(s): TSH, T4TOTAL, T3FREE, THYROIDAB in the last 72 hours.  Invalid input(s): FREET3 Anemia work up No results for input(s): VITAMINB12, FOLATE, FERRITIN, TIBC, IRON, RETICCTPCT in the last 72 hours. Urinalysis    Component Value Date/Time   COLORURINE YELLOW 04/04/2016 1504   APPEARANCEUR CLEAR 04/04/2016 1504   LABSPEC 1.015 04/04/2016 1504   PHURINE 6.0 04/04/2016 1504   GLUCOSEU NEGATIVE 04/04/2016 1504   HGBUR NEGATIVE 04/04/2016 1504   BILIRUBINUR NEGATIVE 04/04/2016 1504   KETONESUR NEGATIVE 04/04/2016 1504   PROTEINUR NEGATIVE 04/04/2016 1504   NITRITE NEGATIVE 04/04/2016 1504   LEUKOCYTESUR NEGATIVE 04/04/2016 1504   Sepsis Labs Invalid input(s): PROCALCITONIN,  WBC,  LACTICIDVEN Microbiology Recent Results (from the past 240 hour(s))  SARS Coronavirus 2 by RT PCR (hospital order, performed in [place] hospital lab) Nasopharyngeal Nasopharyngeal Swab     Status: None   Collection Time: 10/03/19 10:41 PM   Specimen: Nasopharyngeal  Swab  Result Value Ref Range Status   SARS Coronavirus 2 NEGATIVE NEGATIVE Final    Comment: (NOTE) SARS-CoV-2 target nucleic acids are NOT DETECTED. The SARS-CoV-2 RNA is generally detectable in upper and lower respiratory specimens during the acute phase of infection. The lowest concentration of SARS-CoV-2 viral copies this assay can detect is 250 copies / mL. A negative result does not preclude SARS-CoV-2 infection and should not be used as the sole basis for treatment or other patient management decisions.  A negative result may occur with improper specimen collection / handling, submission of specimen other than nasopharyngeal swab, presence of viral mutation(s) within the areas targeted by this assay, and inadequate number of viral copies (<250 copies / mL). A negative result must be combined with clinical observations, patient history, and epidemiological information. Fact Sheet for Patients:   StrictlyIdeas.no Fact Sheet for Healthcare Providers: BankingDealers.co.za This test is not yet approved or cleared  by the [place] FDA and has been authorized for detection and/or diagnosis of SARS-CoV-2 by FDA under an Emergency Use Authorization (EUA).  This EUA will remain in effect (meaning this test can be used) for the duration of the COVID-19 declaration under Section 564(b)(1) of the Act, 21 U.S.C. section 360bbb-3(b)(1), unless the authorization is terminated or revoked sooner. Performed at [facility], [place] [address], [place], [place] [address]      Time coordinating discharge: 25 minutes The [place] controlled substances registry was reviewed for this patient       SIGNED:   Edwin Dada, MD  [facility] 10/07/2019, 10:36 AM

## 2019-10-07 NOTE — Progress Notes (Signed)
Nathan Arn, MD  Physician  Physical Medicine and Rehabilitation  PMR Pre-admission      Addendum  Date of Service:  10/05/2019  3:47 PM      Related encounter: ED to Hosp-Admission (Current) from 10/03/2019 in [place] [facility] Progressive Care        Show:Clear all [x] Manual[x] Template[x] Copied  Added by: [x] Cristina Gong, RN[x] Nathan Arn, MD  [] Hover for details PMR Admission Coordinator Pre-Admission Assessment   Patient: Nathan Proctor. is an 79 y.o., male MRN: [id number] DOB: 18-May-1940 Height: 5\' 10"  (177.8 cm) Weight: (!) 141.6 kg                                                                                                                                                  Insurance Information   PRIMARY: [employer]      Policy#: [id number]      Subscriber: pt CM Name: Dot      Phone#: [phone] ext [phone]    Fax#: [phone] Pre-Cert#: [id number] approved for 7 days with f/u with Jeanette Caprice ext [phone]      Employer:  Benefits:  Phone #: [phone]     Name:  Eff. Date: 05/01/2019     Deduct: $500      Out of Pocket Max: $6700      Life Max: none  CIR: $450 per day days 1 until 4      SNF: 100% coverage Outpatient: $20 to $40 per visit     Co-Pay: visits limited per medcial neccesity Home Health: 100%      Co-Pay: bisits per medcial neccesity DME: 80%     Co-Pay: 20% Providers: in network  SECONDARY: none      Policy#:       Phone#:    Development worker, community:       Phone#:    The Engineer, petroleum" for patients in Inpatient Rehabilitation Facilities with attached "Privacy Act Gantt Records" was provided and verbally reviewed with: Patient and Family   Emergency Contact Information Contact Information       Name Relation Home Work [place] [address] [place] [phone]   [phone]         Current Medical History  Patient Admitting Diagnosis: CVA   History of Present Illness:  Nathan Proctor is an 79 year old male with history of HTN, T2DM, CKD III, morbid obesity, prostate cancer; who was admitted on 10/03/19 with persistent right-sided weakness and numbness that started on a.m. on 06/04.  Blood sugar-57 at admission and TPA not administered due to late presentation.  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  Dr. Leonie Man felt the stroke  was embolic due to unknown source and recommended DAPT x3 weeks followed by ASA alone.     2D echo done showing severe LVH with EF 45 to 50%, mild aortic sclerosis, aortic dilatation and global hypokinesis.  He has few episodes of NSVT ~ 16 seconds on 06/08 and cardiology consulted for input. Coreg resumed and  Repeat echo done revealing global hypokinesis with normal right RV and EF 40-45% with normal systolic function.  No signs of overload and lipitor increased to 80 mg. Dr. Harrington Challenger recommends 3wk Zio patch at discharge and further evaluation after recovery from CVA.  DM managed with SSI and acute on CKD improved with IVF for hydration. .        Therapy ongoing and patient limited by weakness, dizziness as well as DOE.   Complete NIHSS TOTAL: 0   Past Medical History      Past Medical History:  Diagnosis Date  . Anemia of chronic disease    . Ankle fracture 02/15/2016  . Cancer [facility])      Prostate  . Chronic constipation    . Chronic kidney disease      stage III  . Diabetes mellitus without complication ([place])      type II   . Diabetic peripheral neuropathy ([place])    . Failure to thrive (0-17)    . Fracture of left lower leg    . Gout    . Hyperlipidemia    . Hypertension    . Morbid obesity ([place])    . Unstable gait        Family History  family history includes Heart Problems in his father and sister.   Prior Rehab/Hospitalizations:  Has  the patient had prior rehab or hospitalizations prior to admission? Yes   Has the patient had major surgery during 100 days prior to admission? No   Current Medications    Current Facility-Administered Medications:  .  aspirin EC tablet 81 mg, 81 mg, Oral, Daily, Garvin Fila, MD, 81 mg at 10/07/19 0917 .  atorvastatin (LIPITOR) tablet 80 mg, 80 mg, Oral, Daily, Adefeso, Oladapo, DO, 80 mg at 10/07/19 0916 .  carvedilol (COREG) tablet 12.5 mg, 12.5 mg, Oral, BID WC, Elodia Florence., MD, 12.5 mg at 10/07/19 0917 .  clopidogrel (PLAVIX) tablet 75 mg, 75 mg, Oral, Daily, Elodia Florence., MD, 75 mg at 10/07/19 0917 .  heparin injection 5,000 Units, 5,000 Units, Subcutaneous, Q8H, Elodia Florence., MD, 5,000 Units at 10/07/19 [id number] .  insulin aspart (novoLOG) injection 0-5 Units, 0-5 Units, Subcutaneous, QHS, Adefeso, Oladapo, DO, 2 Units at 10/06/19 2116 .  insulin aspart (novoLOG) injection 0-9 Units, 0-9 Units, Subcutaneous, TID WC, Adefeso, Oladapo, DO, 1 Units at 10/06/19 1804   Patients Current Diet:  Diet Order                  Diet - low sodium heart healthy          Diet heart healthy/carb modified Room service appropriate? 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  Independent   Indoor Mobility: Did the patient need assistance with walking from room to room (with or without device)? Independent   Stairs: Did the patient need assistance with internal or external stairs (with or without device)? Independent   Functional Cognition: Did the patient need help planning regular tasks such as shopping or remembering to take medications? Independent   Home Assistive Devices / Equipment Home Assistive Devices/Equipment: None Home Equipment: Cane - single point   Prior Device Use: Indicate devices/aids used by the patient prior to current illness, exacerbation or injury?  cane as needed   Current Functional Level Cognition   Overall Cognitive Status: Impaired/Different from baseline Current Attention Level: Sustained Orientation Level: Oriented to time, Oriented to place Following Commands: Follows one step commands with increased time Safety/Judgement: Decreased awareness of safety, Decreased awareness of deficits General Comments: A&Ox4 this day, with increased time required to respond to questions. Multimodal cuing and demonstration required to participate in tasks.    Extremity Assessment (includes Sensation/Coordination)   Upper Extremity Assessment: RUE deficits/detail RUE Deficits / Details: Weakness at grasp and gross motor. Unable to perform opposition to finger and pinky. Poor rapid alternating movement and gross motor coordination. Decreased awareness of RUE. Difficulty opening tooth paste and managing tooth brush.  RUE Coordination: decreased fine motor, decreased gross motor  Lower Extremity Assessment: Defer to PT evaluation RLE Deficits / Details: R hip flexor 3+/5; quad 4/5; hamstring tested seated 4-/5 RLE Coordination: decreased gross motor     ADLs   Overall ADL's : Needs assistance/impaired Eating/Feeding: Minimal assistance, Sitting Eating/Feeding Details (indicate cue type and reason): Difficulty with bilateral  coorindation and grasp at RUE Grooming: Minimal assistance, Sitting Grooming Details (indicate cue type and reason): issued red built up handled and educated in use for grooming items (toothbrush) as well as feeding utensils  Upper Body Bathing: Moderate assistance, Sitting Lower Body Bathing: Maximal assistance, Sit to/from stand Upper Body Dressing : Moderate assistance, Sitting Lower Body Dressing: Maximal assistance, Sit to/from stand Lower Body Dressing Details (indicate cue type and reason): Max A for sock management and  Toilet Transfer: Minimal assistance, Ambulation, RW(simulated to recliner) Functional mobility during ADLs: Minimal assistance General ADL Comments: focus on RUE fine motor strengthening/coordination     Mobility   Overal bed mobility: Needs Assistance Bed Mobility: Supine to Sit Sidelying to sit: Min guard Supine to sit: Min assist Sit to supine: Min guard, HOB elevated Sit to sidelying: Min guard General bed mobility comments: Min assist for trunk elevation for pt to bring RUE forward, assist for scooting to EOB. Increased time and effort.     Transfers   Overall transfer level: Needs assistance Equipment used: None Transfers: Sit to/from Stand Sit to Stand: Min assist General transfer comment: Min assist for initial power up and steadying upon standing; sit to stand x4, from EOB x2, hallway chair, and recliner.     Ambulation / Gait / Stairs / Wheelchair Mobility   Ambulation/Gait Ambulation/Gait assistance: Min assist, Min guard Gait Distance (Feet): 75 Feet(75+30+50+55) Assistive device: None Gait Pattern/deviations: Wide base of support, Step-through pattern, Decreased stride length, Drifts right/left General Gait Details: Min guard for  safety, occasional min assist to steady. Dynamic balance challenged, pt with mild to moderate difficulty accepting challenge (see balance section). Increased lateral leaning bilaterally with each step, LOB requiring PT  assist to correct x1 with distraction in hallway. Gait velocity: decr     Posture / Balance Balance Overall balance assessment: Needs assistance Sitting-balance support: No upper extremity supported, Feet supported Sitting balance-Leahy Scale: Fair Standing balance support: No upper extremity supported, During functional activity Standing balance-Leahy Scale: Fair Standing balance comment: accepts mild challenge to gait High level balance activites: Head turns, Turns, Direction changes High Level Balance Comments: Mild deviations to gait path with horizontal head turning, LOB with distraction and head turn in hallway     Special needs/care consideration   Hgb A1c 5.6 Designated visitor is wife, Ricci Barker    Previous Home Environment  Living Arrangements: Spouse/significant other, Children(54 year old son, [name] lives with them)  Lives With: Spouse, Son Available Help at Discharge: Family, Available 24 hours/day Type of Home: [place]: One level Home Access: Stairs to enter Entrance Stairs-Rails: None Entrance Stairs-Number of Steps: 3 Bathroom Shower/Tub: Other (comment)(tub with a door) Bathroom Toilet: Handicapped height Bathroom Accessibility: Yes How Accessible: Accessible via walker [place]: No   Discharge Living Setting Plans for Discharge Living Setting: Patient's home, Lives with (comment)(wife and adult son) Type of Home at Discharge: House Discharge Home Layout: One level Discharge Home Access: Stairs to enter Entrance Stairs-Rails: None Entrance Stairs-Number of Steps: 3 Discharge Bathroom Shower/Tub: Other (comment)(tub with a door) Discharge Bathroom Toilet: Handicapped height Discharge Bathroom Accessibility: Yes How Accessible: Accessible via walker Does the patient have any problems obtaining your medications?: No   Social/Family/Support Systems Patient Roles: Spouse, Parent(business owner) Contact Information: wife, Financial trader Anticipated  Caregiver: wife Anticipated Caregiver's Contact Information: see above Caregiver Availability: 24/7 Discharge Plan Discussed with Primary Caregiver: Yes Is Caregiver In Agreement with Plan?: Yes Does Caregiver/Family have Issues with Lodging/Transportation while Pt is in Rehab?: No   Goals Patient/Family Goal for Rehab: Mod I with PT and OT Expected length of stay: ELOS 7 to 10 days Pt/Family Agrees to Admission and willing to participate: Yes Program Orientation Provided & Reviewed with Pt/Caregiver Including Roles  & Responsibilities: Yes   Decrease burden of Care through IP rehab admission: n/a   Possible need for SNF placement upon discharge: Not anticipated   Patient Condition: This patient's condition remains as documented in the consult dated 10/05/2019, in which the Rehabilitation Physician determined and documented that the patient's condition is appropriate for intensive rehabilitative care in an inpatient rehabilitation facility. Will admit to inpatient rehab today.   Preadmission Screen Completed By:  Cleatrice Burke, RN, 10/07/2019 11:49 AM ______________________________________________________________________   Discussed status with Dr. Posey Pronto on 10/07/2019 at  54 and received approval for admission today.   Admission Coordinator:  Cleatrice Burke, time 10/07/2019 Date 10/07/2019         Revision History

## 2019-10-08 ENCOUNTER — Inpatient Hospital Stay (HOSPITAL_COMMUNITY): Payer: Medicare PPO | Admitting: Physical Therapy

## 2019-10-08 ENCOUNTER — Inpatient Hospital Stay (HOSPITAL_COMMUNITY): Payer: Medicare PPO | Admitting: Occupational Therapy

## 2019-10-08 DIAGNOSIS — I63512 Cerebral infarction due to unspecified occlusion or stenosis of left middle cerebral artery: Secondary | ICD-10-CM

## 2019-10-08 LAB — CBC WITH DIFFERENTIAL/PLATELET
Abs Immature Granulocytes: 0.01 10*3/uL (ref 0.00–0.07)
Basophils Absolute: 0 10*3/uL (ref 0.0–0.1)
Basophils Relative: 0 %
Eosinophils Absolute: 0.1 10*3/uL (ref 0.0–0.5)
Eosinophils Relative: 1 %
HCT: 34.3 % — ABNORMAL LOW (ref 39.0–52.0)
Hemoglobin: 11 g/dL — ABNORMAL LOW (ref 13.0–17.0)
Immature Granulocytes: 0 %
Lymphocytes Relative: 32 %
Lymphs Abs: 1.8 10*3/uL (ref 0.7–4.0)
MCH: 30.6 pg (ref 26.0–34.0)
MCHC: 32.1 g/dL (ref 30.0–36.0)
MCV: 95.5 fL (ref 80.0–100.0)
Monocytes Absolute: 0.7 10*3/uL (ref 0.1–1.0)
Monocytes Relative: 13 %
Neutro Abs: 3.1 10*3/uL (ref 1.7–7.7)
Neutrophils Relative %: 54 %
Platelets: 175 10*3/uL (ref 150–400)
RBC: 3.59 MIL/uL — ABNORMAL LOW (ref 4.22–5.81)
RDW: 13.3 % (ref 11.5–15.5)
WBC: 5.7 10*3/uL (ref 4.0–10.5)
nRBC: 0 % (ref 0.0–0.2)

## 2019-10-08 LAB — COMPREHENSIVE METABOLIC PANEL
ALT: 18 U/L (ref 0–44)
AST: 20 U/L (ref 15–41)
Albumin: 3.5 g/dL (ref 3.5–5.0)
Alkaline Phosphatase: 68 U/L (ref 38–126)
Anion gap: 9 (ref 5–15)
BUN: 36 mg/dL — ABNORMAL HIGH (ref 8–23)
CO2: 25 mmol/L (ref 22–32)
Calcium: 9.3 mg/dL (ref 8.9–10.3)
Chloride: 108 mmol/L (ref 98–111)
Creatinine, Ser: 2.08 mg/dL — ABNORMAL HIGH (ref 0.61–1.24)
GFR calc Af Amer: 34 mL/min — ABNORMAL LOW (ref >60)
GFR calc non Af Amer: 29 mL/min — ABNORMAL LOW (ref >60)
Glucose, Bld: 117 mg/dL — ABNORMAL HIGH (ref 70–99)
Potassium: 4.1 mmol/L (ref 3.5–5.1)
Sodium: 142 mmol/L (ref 135–145)
Total Bilirubin: 1.1 mg/dL (ref 0.3–1.2)
Total Protein: 6.4 g/dL — ABNORMAL LOW (ref 6.5–8.1)

## 2019-10-08 LAB — GLUCOSE, CAPILLARY
Glucose-Capillary: 113 mg/dL — ABNORMAL HIGH (ref 70–99)
Glucose-Capillary: 114 mg/dL — ABNORMAL HIGH (ref 70–99)
Glucose-Capillary: 141 mg/dL — ABNORMAL HIGH (ref 70–99)
Glucose-Capillary: 167 mg/dL — ABNORMAL HIGH (ref 70–99)

## 2019-10-08 MED ORDER — BLOOD PRESSURE CONTROL BOOK
Freq: Once | Status: AC
  Filled 2019-10-08: qty 1

## 2019-10-08 MED ORDER — LIVING WELL WITH DIABETES BOOK
Freq: Once | Status: AC
  Filled 2019-10-08: qty 1

## 2019-10-08 NOTE — Progress Notes (Signed)
[facility] Individual Statement of Services  Patient Name:  Nathan Proctor.  Date:  10/08/2019  Welcome to the [facility].  Our goal is to provide you with an individualized program based on your diagnosis and situation, designed to meet your specific needs.  With this comprehensive rehabilitation program, you will be expected to participate in at least 3 hours of rehabilitation therapies Monday-Friday, with modified therapy programming on the weekends.  Your rehabilitation program will include the following services:  Physical Therapy (PT), Occupational Therapy (OT), Speech Therapy (ST), 24 hour per day rehabilitation nursing, Therapeutic Recreaction (TR), Neuropsychology, Care Coordinator, Rehabilitation Medicine, Nutrition Services, Pharmacy Services and Other  Weekly team conferences will be held on Wednesdays to discuss your progress.  Your Inpatient Rehabilitation Care Coordinator will talk with you frequently to get your input and to update you on team discussions.  Team conferences with you and your family in attendance may also be held.  Expected length of stay: 7-10 Days  Overall anticipated outcome: MOD I  Depending on your progress and recovery, your program may change. Your Inpatient Rehabilitation Care Coordinator will coordinate services and will keep you informed of any changes. Your Inpatient Rehabilitation Care Coordinator's name and contact numbers are listed  below.  The following services may also be recommended but are not provided by the [facility]:    [place] will be made to provide these services after discharge if needed.  Arrangements include referral to agencies that provide these services.  Your insurance has been verified to be:  [employer] Your primary doctor is:  Nolene Ebbs, MD  Pertinent information will be  shared with your doctor and your insurance company.  Inpatient Rehabilitation Care Coordinator:  Erlene Quan, [place] or [phone]  Information discussed with and copy given to patient by: Dyanne Iha, 10/08/2019, 12:27 PM

## 2019-10-08 NOTE — Progress Notes (Signed)
Inpatient Rehabilitation Medication Review by a Pharmacist  A complete drug regimen review was completed for this patient to identify any potential clinically significant medication issues.  Clinically significant medication issues were identified:  yes   Type of Medication Issue Identified Description of Issue Urgent (address now) Non-Urgent (address on AM team rounds) Plan Plan Accepted by Provider? (Yes / No / Pending AM Rounds)  Drug Interaction(s) (clinically significant)       Duplicate Therapy       Allergy       No Medication Administration End Date       Incorrect Dose       Additional Drug Therapy Needed       Other  Amlodipine, vitamin D, tinazidine not resumed at tx but was on the dc summary. ASA/plavix x 3 wks then ASA alone   Stop date for plavix added. Need f/u with other meds    Name of provider notified for urgent issues identified: Algis Liming, PA  Provider Method of Notification: Secured chat   For non-urgent medication issues to be resolved on team rounds tomorrow morning a [facility] Secure [place] was sent to:    Pharmacist comments:   Time spent performing this drug regimen review (minutes):  20   [place] 10/08/2019 3:12 PM

## 2019-10-08 NOTE — Progress Notes (Signed)
Occupational Therapy Session Note  Patient Details  Name: Nathan Proctor. MRN: [id number] Date of Birth: [birth year]  Today's Date: 10/08/2019 OT Individual Time: 0459-1368 OT Individual Time Calculation (min): 40 min    Short Term Goals: Week 1:  OT Short Term Goal 1 (Week 1): STGs=LTGs secondary to estimated short LOS  Skilled Therapeutic Interventions/Progress Updates:    Pt greeted at time of session sitting up in wheelchair agreeable to OT session to focus on transfers and NMR of RUE. Pt expressed need to toilet, ambulated to bathroom with RW with Min A to guide RW through turns and performed toileting 3/3 tasks with Min A for clothing management and transfer. After transferring back to wheelchair with Min A pt brought to rehab gym and participated in [place] tasks for RUE 9 hole peg test to assess deficits and improve [name]. Pt averaged 1:16 for RUE and 35 seconds for LUE. Pt also given pink putty and provided education on pinch/grip exercises to perform in his room to continue to work on [name]. Pt did try to ambulate without AD and without locking brakes when back in room, ed provided on safety and fall precautions. Pt up in recliner with alarm on, call bell in reach, all needs met.    Therapy Documentation Precautions:  Precautions Precautions: Fall Restrictions Weight Bearing Restrictions: No Pain: none  Therapy/Group: Individual Therapy  Viona Gilmore 10/08/2019, 4:52 PM

## 2019-10-08 NOTE — Progress Notes (Signed)
Patient information reviewed and entered into [facility] System by Becky Aveya Beal, PPS coordinator. Information including medical coding, function ability, and quality indicators will be reviewed and updated through discharge.   

## 2019-10-08 NOTE — Progress Notes (Signed)
Inpatient Rehabilitation Care Coordinator Assessment and Plan  Patient Details  Name: Nathan Proctor. MRN: [id number] Date of Birth: 08-31-1940  Today's Date: 10/08/2019  Problem List:  Patient Active Problem List   Diagnosis Date Noted  . Acute ischemic left MCA stroke ([place]) 10/07/2019  . Morbid obesity ([place])   . NSVT (nonsustained ventricular tachycardia) ([place])   . Diabetic peripheral neuropathy ([place])   . Acute ischemic stroke ([place]) 10/04/2019  . Hypoglycemia due to insulin 10/04/2019  . CKD (chronic kidney disease), stage III 10/04/2019  . Dehydration 10/04/2019  . Dyslipidemia 10/04/2019  . Stroke ([place]) 10/04/2019  . Ankle fracture, left 02/29/2016  . Closed left ankle fracture 02/29/2016  . Gout 02/20/2016  . Essential hypertension 02/20/2016   Past Medical History:  Past Medical History:  Diagnosis Date  . Anemia of chronic disease   . Ankle fracture 02/15/2016  . Cancer [facility])    Prostate  . Chronic constipation   . Chronic kidney disease    stage III  . Diabetes mellitus without complication ([place])    type II   . Diabetic peripheral neuropathy ([place])   . Failure to thrive (0-17)   . Fracture of left lower leg   . Gout   . Hyperlipidemia   . Hypertension   . Morbid obesity ([place])   . Unstable gait    Past Surgical History:  Past Surgical History:  Procedure Laterality Date  . ORIF ANKLE FRACTURE Left 02/29/2016   Procedure: OPEN REDUCTION INTERNAL FIXATION (ORIF) ANKLE FRACTURE;  Surgeon: Nicholes Stairs, MD;  Location: [facility] ORS;  Service: Orthopedics;  Laterality: Left;  . PROSTATE SURGERY     Social History:  reports that he quit smoking about 42 years ago. His smoking use included cigarettes. He has never used smokeless tobacco. He reports current alcohol use. He reports that he does not use drugs.  Family / Support Systems Patient Roles: Spouse Spouse/Significant Other: Financial trader (spouse) Children: has son in home Anticipated Caregiver:  spouse Caregiver Availability: 24/7  Social History Preferred language: English Religion: [employer] Cultural Background: IT trainer Read: Yes Write: Yes   Abuse/Neglect Abuse/Neglect Assessment Can Be Completed: Yes Physical Abuse: Denies Verbal Abuse: Denies Sexual Abuse: Denies Exploitation of patient/patient's resources: Denies Self-Neglect: Denies  Emotional Status Pt's affect, behavior and adjustment status: no Recent Psychosocial Issues: no Psychiatric History: no Substance Abuse History: no  Patient / Family Perceptions, Expectations & Goals Pt/Family understanding of illness & functional limitations: yes Pt/family expectations/goals: Goal to discharge home with spouse and son to provide care  [employer]: None Premorbid Home Care/DME Agencies: None Transportation available at discharge: spouse/son able to transport  Discharge Planning Living Arrangements: Spouse/significant other, Children Support Systems: Spouse/significant other, Children Type of Residence: Private residence (3 steps, no railings) Insurance Resources: Multimedia programmer (specify) ([place]) Financial Screen Referred: No Living Expenses: Medical laboratory scientific officer Management: Patient, Spouse Does the patient have any problems obtaining your medications?: No Care Coordinator Anticipated Follow Up Needs: HH/OP Expected length of stay: 7-10 Days  Clinical Impression SW entered patient's room. Patient having lunch, very pleasant. Sw introduced self, explained role and process. SW will continue to follow up with patient to address questions and concerns. Patient reports everything is going well.   Dyanne Iha 10/08/2019, 2:36 PM

## 2019-10-08 NOTE — Progress Notes (Signed)
[facility] PHYSICAL MEDICINE & REHABILITATION PROGRESS NOTE   Subjective/Complaints:  No issues overnite , minA with ADLs, toileting this am with OT Had BM  ROS- neg CP, SOB, N/V/D  Objective:   ECHOCARDIOGRAM LIMITED  Result Date: 10/06/2019    ECHOCARDIOGRAM LIMITED REPORT   Patient Name:   Xue Low. Date of Exam: 10/06/2019 Medical Rec #:  [id number]              Height:       70.0 in Accession #:    [id number]             Weight:       312.2 lb Date of Birth:  07-01-1940              BSA:          2.522 m Patient Age:    79 years               BP:           151/70 mmHg Patient Gender: M                      HR:           75 bpm. Exam Location:  Inpatient Procedure: Limited Echo and Intracardiac Opacification Agent STAT ECHO Indications:    Stroke 434.91 / I163.9  History:        Patient has prior history of Echocardiogram examinations, most                 recent 10/05/2019. Stroke; Risk Factors:Hypertension, Former                 Smoker and Dyslipidemia. CKD (chronic kidney disease), stage                 III.  Sonographer:    Leavy Cella Referring Phys: [id number] PAULA V ROSS IMPRESSIONS  1. Suboptimal imaging even after contrast administration. Left ventricular ejection fraction, by estimation, is 40 to 45%. The left ventricle has mildly decreased function. Global hypokinesis.  2. Right ventricular systolic function is normal. The right ventricular size is normal. FINDINGS  Left Ventricle: Left ventricular ejection fraction, by estimation, is 40 to 45%. The left ventricle has mildly decreased function. The left ventricle demonstrates global hypokinesis. Definity contrast agent was given IV to delineate the left ventricular  endocardial borders. Right Ventricle: The right ventricular size is normal. Right ventricular systolic function is normal. Oswaldo Milian MD Electronically signed by Oswaldo Milian MD Signature Date/Time: 10/06/2019/9:09:25 PM    Final    Recent Labs     10/07/19 0240 10/08/19 0035  WBC 5.5 5.7  HGB 11.6* 11.0*  HCT 35.7* 34.3*  PLT 175 175   Recent Labs    10/07/19 0240 10/08/19 0035  NA 141 142  K 4.0 4.1  CL 109 108  CO2 26 25  GLUCOSE 106* 117*  BUN 24* 36*  CREATININE 1.47* 2.08*  CALCIUM 9.4 9.3    Intake/Output Summary (Last 24 hours) at 10/08/2019 0618 Last data filed at 10/08/2019 0150 Gross per 24 hour  Intake 180 ml  Output 200 ml  Net -20 ml     Physical Exam: Vital Signs Blood pressure 106/80, pulse 75, temperature 98.5 F (36.9 C), temperature source Oral, resp. rate 18, height 5\' 10"  (1.778 m), weight (!) 137 kg, SpO2 96 %.   General: No acute distress Mood and affect are appropriate Heart: Regular rate  and rhythm no rubs murmurs or extra sounds Lungs: Clear to auscultation, breathing unlabored, no rales or wheezes Abdomen: Positive bowel sounds, soft nontender to palpation, nondistended Extremities: No clubbing, cyanosis, or edema Skin: No evidence of breakdown, no evidence of rash Neurologic: Cranial nerves II through XII intact, motor strength is 5/5 in bilateral deltoid, bicep, tricep, grip, hip flexor, knee extensors, ankle dorsiflexor and plantar flexor Sensory exam normal sensation to light touch  in bilateral upper and lower extremities Cerebellar exam normal finger to nose to finger as well as heel to shin in bilateral upper and lower extremities  Decreased fine motor unable to perform finger to thumb opposition in the RIght hand  Musculoskeletal: Full range of motion in all 4 extremities. No joint swelling   Assessment/Plan: 1. Functional deficits secondary to Left MCA infarct  which require 3+ hours per day of interdisciplinary therapy in a comprehensive inpatient rehab setting.  Physiatrist is providing close team supervision and 24 hour management of active medical problems listed below.  Physiatrist and rehab team continue to assess barriers to discharge/monitor patient progress  toward functional and medical goals  Care Tool:  Bathing              Bathing assist       Upper Body Dressing/Undressing Upper body dressing   What is the patient wearing?: Hospital gown only    Upper body assist      Lower Body Dressing/Undressing Lower body dressing      What is the patient wearing?: Hospital gown only     Lower body assist       Toileting Toileting    Toileting assist Assist for toileting: Independent with assistive device Assistive Device Comment: urinal   Transfers Chair/bed transfer  Transfers assist     Chair/bed transfer assist level: Independent with assistive device (rolling walker)     Locomotion Ambulation   Ambulation assist              Walk 10 feet activity   Assist           Walk 50 feet activity   Assist           Walk 150 feet activity   Assist           Walk 10 feet on uneven surface  activity   Assist           Wheelchair     Assist               Wheelchair 50 feet with 2 turns activity    Assist            Wheelchair 150 feet activity     Assist          Blood pressure 106/80, pulse 75, temperature 98.5 F (36.9 C), temperature source Oral, resp. rate 18, height 5\' 10"  (1.778 m), weight (!) 137 kg, SpO2 96 %.    Medical Problem List and Plan: 1.  Dizziness, DOE, balance deficits secondary to left MCA infarct.             -patient may shower             -ELOS/Goals: 7-10 days/mod I/supervision             Admit to CIR 2.  Antithrombotics: -DVT/anticoagulation:  Pharmaceutical: Heparin             -antiplatelet therapy: DAPT X 3 weeks 3. Pain Management: Neurontin bid for neuropathy. Tylenol prn.  4. Mood: LCSW to follow for evaluation and support.              -antipsychotic agents: N/A  5. Neuropsych: This patient is capable of making decisions on his own behalf. 6. Skin/Wound Care: routine pressure relief measures.  7.  Fluids/Electrolytes/Nutrition: Monitor I/O.  CMP ordered for tomorrow a.m. 8. T2DM with neuropathy: Monitor BS ac/hs. Was on 70/30 insulin --30 units daily. Will resume at 5 units in am and titrate upwards.              Monitor with increased mobility CBG (last 3)  Recent Labs    10/07/19 1700 10/07/19 2051 10/08/19 0605  GLUCAP 159* 173* 113*  controlled 10/08/2019  9. CKD III: BUN/SCr- 31/1.87 at admission-->24/1.47.              CMP ordered for tomorrow a.m. 10. HTN: Monitor for orthostatic changes--reports of dizziness with activity. Permissive hypertension--on Coreg, Norvasc             Vitals:   10/07/19 1929 10/08/19 0411  BP: 120/74 106/80  Pulse: 88 75  Resp: 16 18  Temp: 98.1 F (36.7 C) 98.5 F (36.9 C)  SpO2: 100% 96%  controlled  11. NSVT: Asymptomatic. Back on Coreg and to follow up on outpatient basis.              Monitor heart rate with increased exertion 12. Morbid obesity-BMI 44.7:  Educate on appropriate diet and importance of weight loss to help promote mobility and helth.  13.. H/o gout: Resume allopurinol  14. Dyslipidemia: Now on high dose statin.  15.  Anemia of chronic disease?:  CBC ordered  LOS: 1 days A FACE TO FACE EVALUATION WAS PERFORMED  Charlett Blake 10/08/2019, 6:18 AM

## 2019-10-08 NOTE — Evaluation (Signed)
Physical Therapy Assessment and Plan  Patient Details  Name: Nathan Proctor. MRN: [id number] Date of Birth: July 30, 1940  PT Diagnosis: Abnormal posture, Abnormality of gait, Difficulty walking, Hemiparesis dominant and Muscle weakness Rehab Potential: Excellent ELOS: 7-10days   Today's Date: 10/08/2019 PT Individual Time: 1110-1210 PT Individual Time Calculation (min): 60 min    Problem List:  Patient Active Problem List   Diagnosis Date Noted  . Acute ischemic left MCA stroke ([place]) 10/07/2019  . Morbid obesity ([place])   . NSVT (nonsustained ventricular tachycardia) ([place])   . Diabetic peripheral neuropathy ([place])   . Acute ischemic stroke ([place]) 10/04/2019  . Hypoglycemia due to insulin 10/04/2019  . CKD (chronic kidney disease), stage III 10/04/2019  . Dehydration 10/04/2019  . Dyslipidemia 10/04/2019  . Stroke ([place]) 10/04/2019  . Ankle fracture, left 02/29/2016  . Closed left ankle fracture 02/29/2016  . Gout 02/20/2016  . Essential hypertension 02/20/2016    Past Medical History:  Past Medical History:  Diagnosis Date  . Anemia of chronic disease   . Ankle fracture 02/15/2016  . Cancer [facility])    Prostate  . Chronic constipation   . Chronic kidney disease    stage III  . Diabetes mellitus without complication ([place])    type II   . Diabetic peripheral neuropathy ([place])   . Failure to thrive (0-17)   . Fracture of left lower leg   . Gout   . Hyperlipidemia   . Hypertension   . Morbid obesity ([place])   . Unstable gait    Past Surgical History:  Past Surgical History:  Procedure Laterality Date  . ORIF ANKLE FRACTURE Left 02/29/2016   Procedure: OPEN REDUCTION INTERNAL FIXATION (ORIF) ANKLE FRACTURE;  Surgeon: Nicholes Stairs, MD;  Location: [facility];  Service: Orthopedics;  Laterality: Left;  . PROSTATE SURGERY      Assessment & Plan Clinical Impression: Patient is a 79 y.o. year old male with history of HTN, T2DM, CKD III, morbid obesity, prostate cancer;  who was admitted on 10/03/19 with persistent right-sided weakness and numbness that started on a.m. on 10/02/2019.  History taken from chart review and patient due to processing.  Blood sugar-57 at admission and TPA not administered due to late presentation.  MRI/MRI brain done revealing acute left MCA infarct affecting left parietal cortex, smooth plaque with 30% stenosis left-ICA bulb, unable to rule out VA origin narrowing/2 mm left periophthalmic aneurysm and no large/medium vessel occlusion.  Dr. Leonie Man felt the stroke was embolic due to unknown source and recommended DAPT x3 weeks followed by ASA alone.    Echocardiogram showing ejection fraction of 45-50% with severe LVH, mild aortic sclerosis, aortic dilatation and global hypokinesis.  He has few episodes of NSVT ~ 16 seconds on 06/08 and cardiology consulted for input. Coreg resumed and repeat echo done revealing global hypokinesis with normal right RV and EF 40-45% with normal systolic function.  No signs of overload and lipitor increased to 80 mg. Dr. Harrington Challenger recommends 3wk Zio patch at discharge and further evaluation after recovery from CVA.  DM managed with SSI and hospital course was complicated by acute on CKD improved with IVF for hydration.   Therapy ongoing and patient limited by weakness, dizziness as well as DOE. CIR recommended due to functional decline.   Patient transferred to CIR on 10/07/2019 .   Patient currently requires CGA/min assist with mobility secondary to muscle weakness, decreased cardiorespiratoy endurance, unbalanced muscle activation and decreased standing balance, decreased postural control  and decreased balance strategies.  Prior to hospitalization, patient was independent  with mobility and lived with Spouse, Son in a House home.  Home access is 3Stairs to enter.  Patient will benefit from skilled PT intervention to maximize safe functional mobility, minimize fall risk and decrease caregiver burden for planned discharge home  with 24 hour supervision.  Anticipate patient will benefit from follow up OP at discharge.  PT - End of Session Activity Tolerance: Tolerates 30+ min activity with multiple rests Endurance Deficit: Yes Endurance Deficit Description: seated rest breaks due to endrance deficits PT Assessment Rehab Potential (ACUTE/IP ONLY): Excellent PT Barriers to Discharge: Inaccessible home environment PT Patient demonstrates impairments in the following area(s): Balance;Safety;Edema;Sensory;Endurance;Motor;Nutrition;Pain;Skin Integrity PT Transfers Functional Problem(s): Bed Mobility;Bed to Chair;Car;Furniture PT Locomotion Functional Problem(s): Ambulation;Stairs PT Plan PT Intensity: Minimum of 1-2 x/day ,45 to 90 minutes PT Frequency: 5 out of 7 days PT Duration Estimated Length of Stay: 7-10days PT Treatment/Interventions: Ambulation/gait training;Community reintegration;DME/adaptive equipment instruction;Neuromuscular re-education;Psychosocial support;Stair training;UE/LE Strength taining/ROM;Balance/vestibular training;Discharge planning;Functional electrical stimulation;Pain management;Skin care/wound management;Therapeutic Activities;UE/LE Coordination activities;Cognitive remediation/compensation;Disease management/prevention;Functional mobility training;Patient/family education;Splinting/orthotics;Therapeutic Exercise;Visual/perceptual remediation/compensation PT Transfers Anticipated Outcome(s): mod-I using LRAD PT Locomotion Anticipated Outcome(s): mod-I uisng LRAD PT Recommendation Follow Up Recommendations: Outpatient PT;24 hour supervision/assistance Patient destination: Home Equipment Recommended: To be determined Equipment Details: has hurrycane  Skilled Therapeutic Intervention Evaluation completed (see details above and below) with education on PT POC and goals and individual treatment initiated with focus on bed mobility, transfers, gait training, stair navigation, balance, activity  tolerance, and education regarding daily therapy schedule, weekly team meetings, purpose of PT evaluation, and other CIR information. Pt received sitting in recliner and very excited to participate in therapy session. Therapist retrieved pt different wheelchair for improved fit to allow increased upright OOB activity tolerance with cushion for pressure relief. Sit<>stands, no AD, with close supervision/CGA for steadying throughout session. Stand pivot transfer recliner>EOB>w/c, no AD, with CGA for steadying. Sit<>supine without using bed features with supervision.  Transported to/from gym in w/c for time management and energy conservation. Simulated ambulatory car transfer (SUV/truck height), no AD, with CGA for safety. Therapist educated pt on discussing when he is cleared to drive with the MD. Ambulated up/down ramp ~34f, no UE support, with CGA/min assist for steadying. Therapist educated pt on importance of calling 911 in event symptoms of stroke occur. Gait training ~723fx2, no AD, with CGA for safety - demonstrates wide BOS, R/L lateral trunk lean onto stance limb, and decreased gait speed. HR monitored after 1st walk to be 87bpm. Ascended/descended 12 steps using B HRs with CGA for steadying self-selecting reciprocal pattern each direction. HR 95bpm recovering to 80bpm with seated rest break Gait training 14121fno AD, with CGA for steadying - continues with above gait impairments. Transported back to room. Stand pivot to recliner, no AD, with CGA for safety. Pt left seated in recliner with needs in reach and chair alarm on.  PT Evaluation Precautions/Restrictions Precautions Precautions: Fall Restrictions Weight Bearing Restrictions: No Pain Pain Assessment Pain Scale: 0-10 Pain Score: 0-No pain Home Living/Prior Functioning Home Living Available Help at Discharge: Family;Available 24 hours/day Type of Home: House Home Access: Stairs to enter [employer] Steps: 3 Entrance  Stairs-Rails: None Home Layout: One level Bathroom Shower/Tub: Other (comment) (tub with door) Bathroom Toilet: Handicapped height Bathroom Accessibility: Yes  Lives With: Spouse;Son Prior Function Level of Independence: Requires assistive device for independence;Independent with transfers;Independent with gait (reports intermittent use of hurrycane for balance)  Able to Take  Stairs?: Yes Driving: Yes Comments: reports has hired help for housework Vision/Perception  Patient Visual Report: reports "blurred" "foggy" vision since the CVA Perception Perception: Within Functional Limits Praxis Praxis: Intact  Cognition Overall Cognitive Status: No family/caregiver present to determine baseline cognitive functioning Arousal/Alertness: Awake/alert Orientation Level: Oriented X4 Attention: Focused;Sustained Focused Attention: Appears intact Sustained Attention: Appears intact Awareness: Appears intact Safety/Judgment: Appears intact Sensation Sensation Light Touch: Appears Intact Hot/Cold: Not tested Proprioception: Appears Intact Stereognosis: Not tested Coordination Gross Motor Movements are Fluid and Coordinated: No Coordination and Movement Description: gross motor coordination midly impaired due to R hemipareiss and generalized deconditioning Motor  Motor Motor: Other (comment) Motor - Skilled Clinical Observations: mild R hemiparesis  Mobility Bed Mobility Bed Mobility: Supine to Sit;Sit to Supine Supine to Sit: Supervision/Verbal cueing Sit to Supine: Supervision/Verbal cueing Transfers Transfers: Sit to Stand;Stand to Sit;Stand Pivot Transfers Sit to Stand: Contact Guard/Touching assist Stand to Sit: Contact Guard/Touching assist Stand Pivot Transfers: Contact Guard/Touching assist Transfer (Assistive device): None Locomotion  Gait Ambulation: Yes Gait Assistance: Contact Guard/Touching assist Gait Distance (Feet): 141 Feet Assistive device: None Gait Gait:  Yes Gait Pattern: Impaired Gait Pattern: Wide base of support;Lateral trunk lean to right;Lateral trunk lean to left Gait velocity: decreased Stairs / Additional Locomotion Stairs: Yes Stairs Assistance: Contact Guard/Touching assist Stair Management Technique: Two rails Number of Stairs: 12 Height of Stairs: 6 Ramp: Contact Guard/touching assist Curb: Minimal Assistance - Patient >75% Wheelchair Mobility Wheelchair Mobility: No  Trunk/Postural Assessment  Cervical Assessment Cervical Assessment: Exceptions to [facility] (forward head) Thoracic Assessment Thoracic Assessment: Exceptions to [facility] (kyphotic) Lumbar Assessment Lumbar Assessment: Exceptions to [facility] (posterior pelvic tilt) Postural Control Postural Control: Deficits on evaluation Postural Limitations: decreased with pt demonstrating wider BOS for increased stability  Balance Balance Balance Assessed: Yes Dynamic Sitting Balance Dynamic Sitting - Balance Support: Feet supported Dynamic Sitting - Level of Assistance: 5: Stand by assistance Dynamic Standing Balance Dynamic Standing - Balance Support: During functional activity Dynamic Standing - Level of Assistance: 4: Min assist Extremity Assessment      RLE Assessment RLE Assessment: Exceptions to [facility] RLE Strength Right Hip Flexion: 3+/5 Right Knee Flexion: 3+/5 Right Knee Extension: 3+/5 Right Ankle Dorsiflexion: 3+/5 Right Ankle Plantar Flexion: 3+/5 LLE Assessment LLE Assessment: Exceptions to [facility] Active Range of Motion (AROM) Comments: WFL LLE Strength Left Hip Flexion: 4+/5 Left Knee Flexion: 4+/5 Left Knee Extension: 4+/5 Left Ankle Dorsiflexion: 4+/5 Left Ankle Plantar Flexion: 4+/5    Refer to Care Plan for Long Term Goals  Recommendations for other services: None   Discharge Criteria: Patient will be discharged from PT if patient refuses treatment 3 consecutive times without medical reason, if treatment goals not met, if there is a change in  medical status, if patient makes no progress towards goals or if patient is discharged from hospital.  The above assessment, treatment plan, treatment alternatives and goals were discussed and mutually agreed upon: by patient  Tawana Scale, PT, DPT 10/08/2019, 7:57 AM

## 2019-10-08 NOTE — Evaluation (Signed)
Occupational Therapy Assessment and Plan  Patient Details  Name: Nathan Proctor. MRN: [id number] Date of Birth: 10-Sep-1940  OT Diagnosis: hemiplegia affecting dominant side, muscle weakness (generalized) and coordination disorder Rehab Potential: Rehab Potential (ACUTE ONLY): Good ELOS: 7-10 days   Today's Date: 10/08/2019 OT Individual Time: 6387-5643 OT Individual Time Calculation (min): 70 min     Problem List:  Patient Active Problem List   Diagnosis Date Noted  . Acute ischemic left MCA stroke ([place]) 10/07/2019  . Morbid obesity ([place])   . NSVT (nonsustained ventricular tachycardia) ([place])   . Diabetic peripheral neuropathy ([place])   . Acute ischemic stroke ([place]) 10/04/2019  . Hypoglycemia due to insulin 10/04/2019  . CKD (chronic kidney disease), stage III 10/04/2019  . Dehydration 10/04/2019  . Dyslipidemia 10/04/2019  . Stroke ([place]) 10/04/2019  . Ankle fracture, left 02/29/2016  . Closed left ankle fracture 02/29/2016  . Gout 02/20/2016  . Essential hypertension 02/20/2016    Past Medical History:  Past Medical History:  Diagnosis Date  . Anemia of chronic disease   . Ankle fracture 02/15/2016  . Cancer [facility])    Prostate  . Chronic constipation   . Chronic kidney disease    stage III  . Diabetes mellitus without complication ([place])    type II   . Diabetic peripheral neuropathy ([place])   . Failure to thrive (0-17)   . Fracture of left lower leg   . Gout   . Hyperlipidemia   . Hypertension   . Morbid obesity ([place])   . Unstable gait    Past Surgical History:  Past Surgical History:  Procedure Laterality Date  . ORIF ANKLE FRACTURE Left 02/29/2016   Procedure: OPEN REDUCTION INTERNAL FIXATION (ORIF) ANKLE FRACTURE;  Surgeon: Nicholes Stairs, MD;  Location: [facility];  Service: Orthopedics;  Laterality: Left;  . PROSTATE SURGERY      Assessment & Plan Clinical Impression: Patient is a 79 y.o. year old male with history of HTN, T2DM, CKD III, morbid  obesity, prostate cancer; who was admitted on 10/03/19 with persistent right-sided weakness and numbness that started on a.m. on 10/02/2019.  History taken from chart review and patient due to processing.  Blood sugar-57 at admission and TPA not administered due to late presentation.  MRI/MRI brain done revealing acute left MCA infarct affecting left parietal cortex, smooth plaque with 30% stenosis left-ICA bulb, unable to rule out VA origin narrowing/2 mm left periophthalmic aneurysm and no large/medium vessel occlusion.  Dr. Leonie Man felt the stroke was embolic due to unknown source and recommended DAPT x3 weeks followed by ASA alone.    Echocardiogram showing ejection fraction of 45-50% with severe LVH, mild aortic sclerosis, aortic dilatation and global hypokinesis.  He has few episodes of NSVT ~ 16 seconds on 06/08 and cardiology consulted for input. Coreg resumed and repeat echo done revealing global hypokinesis with normal right RV and EF 40-45% with normal systolic function.  No signs of overload and lipitor increased to 80 mg. Dr. Harrington Challenger recommends 3wk Zio patch at discharge and further evaluation after recovery from CVA.  DM managed with SSI and hospital course was complicated by acute on CKD improved with IVF for hydration.   Therapy ongoing and patient limited by weakness, dizziness as well as DOE.  CIR recommended due to functional decline .  Patient transferred to CIR on 10/07/2019 .    Patient currently requires min with basic self-care skills and IADL secondary to muscle weakness, decreased cardiorespiratoy endurance and  decreased sitting balance, decreased standing balance, hemiplegia and decreased balance strategies.  Prior to hospitalization, patient could complete ADLs and IADLs with min.  Patient will benefit from skilled intervention to increase independence with basic self-care skills prior to discharge home with care partner.  Anticipate patient will require intermittent supervision and follow  up outpatient.  OT - End of Session Activity Tolerance: Decreased this session Endurance Deficit: Yes Endurance Deficit Description: multiple rest breaks needed with self care tasks OT Assessment Rehab Potential (ACUTE ONLY): Good OT Barriers to Discharge: Other (comments) OT Barriers to Discharge Comments: none known at this time OT Patient demonstrates impairments in the following area(s): Balance;Endurance;Motor;Safety OT Basic ADL's Functional Problem(s): Grooming;Bathing;Dressing;Toileting OT Transfers Functional Problem(s): Toilet;Tub/Shower OT Additional Impairment(s): Fuctional Use of Upper Extremity OT Plan OT Intensity: Minimum of 1-2 x/day, 45 to 90 minutes OT Frequency: 5 out of 7 days OT Duration/Estimated Length of Stay: 7-10 days OT Treatment/Interventions: Balance/vestibular training;DME/adaptive equipment instruction;Patient/family education;Therapeutic Activities;Psychosocial support;Therapeutic Exercise;Community reintegration;Functional mobility training;Self Care/advanced ADL retraining;UE/LE Strength taining/ROM;Discharge planning;Neuromuscular re-education;UE/LE Coordination activities OT Self Feeding Anticipated Outcome(s): n/a OT Basic Self-Care Anticipated Outcome(s): Mod I OT Toileting Anticipated Outcome(s): mod I OT Bathroom Transfers Anticipated Outcome(s): Mod I - toileting and  S - shower OT Recommendation Recommendations for Other Services: Therapeutic Recreation consult Therapeutic Recreation Interventions: Outing/community reintergration Patient destination: Home Follow Up Recommendations: Outpatient OT Equipment Recommended: To be determined   Skilled Therapeutic Intervention Upon entering the room, pt seated in recliner chair with no c/o pain. Pt ambulating without use of RW into bathroom for toileting needs. Pt transferred onto commode with  Min A and having BM. Pt performed hygiene with min A for standing balance and then transferred into shower  and seated on shower seat. Pt bathing while remaining seated with assistance to wash buttocks and B feet. Pt transferred to sitting in recliner chair for dressing tasks. Pt able to thread pants and socks onto feet with increased time and encouragement. Pt needing min guard - min A for standing balance while pulling pants over B hips. Pt donning pull over shirt with set up A. OT educating pt on OT purpose, POC, and goals with pt verbalizing understanding and agreement. Pt remains in recliner chair with chair alarm activated and call bell within reach.   OT Evaluation Precautions/Restrictions  Precautions Precautions: Fall Restrictions Weight Bearing Restrictions: No Pain Pain Assessment Pain Scale: 0-10 Pain Score: 0-No pain Home Living/Prior Functioning Home Living Family/patient expects to be discharged to:: Private residence Living Arrangements: Spouse/significant other, Children Available Help at Discharge: Family, Available 24 hours/day Type of Home: House Home Access: Stairs to enter [employer] of Steps: 3 Entrance Stairs-Rails: None Home Layout: One level Bathroom Shower/Tub: Other (comment) (tub with door) Bathroom Toilet: Handicapped height Bathroom Accessibility: Yes  Lives With: Spouse, Son Prior Function Level of Independence: Requires assistive device for independence, Independent with transfers, Independent with gait (reports intermittent use of hurrycane for balance)  Able to Take Stairs?: Yes Driving: Yes Comments: reports has hired help for housework Vision Baseline Vision/History: No visual deficits Patient Visual Report: Other (comment) (reports "blurred" "foggy" vision since the CVA) Perception  Perception: Within Functional Limits Praxis Praxis: Intact Cognition Overall Cognitive Status: Within Functional Limits for tasks assessed Arousal/Alertness: Awake/alert Year: 2021 Month: June Day of Week: Correct Memory: Appears intact Immediate  Memory Recall: Sock;Blue;Bed Memory Recall Sock: Without Cue Memory Recall Blue: Without Cue Memory Recall Bed: Without Cue Attention: Focused;Sustained Focused Attention: Appears intact Sustained Attention: Appears intact Awareness:  Appears intact Safety/Judgment: Appears intact Sensation Sensation Light Touch: Appears Intact Hot/Cold: Appears Intact Proprioception: Appears Intact Stereognosis: Not tested Coordination Gross Motor Movements are Fluid and Coordinated: Yes Fine Motor Movements are Fluid and Coordinated: No Coordination and Movement Description: decreased dexterity in R UE Motor  Motor Motor: Other (comment) Motor - Skilled Clinical Observations: mild R hemiparesis Mobility  Bed Mobility Bed Mobility: Supine to Sit;Sit to Supine Supine to Sit: Supervision/Verbal cueing Sit to Supine: Supervision/Verbal cueing Transfers Sit to Stand: Contact Guard/Touching assist Stand to Sit: Contact Guard/Touching assist  Trunk/Postural Assessment  Cervical Assessment Cervical Assessment: Exceptions to [facility] (forward head) Thoracic Assessment Thoracic Assessment: Exceptions to [facility] (kyphotic) Lumbar Assessment Lumbar Assessment: Exceptions to [facility] (posterier pelvic tilt)  Balance Balance Balance Assessed: Yes Dynamic Sitting Balance Dynamic Sitting - Balance Support: Feet supported Dynamic Sitting - Level of Assistance: 5: Stand by assistance Dynamic Standing Balance Dynamic Standing - Balance Support: During functional activity Dynamic Standing - Level of Assistance: 4: Min assist Extremity/Trunk Assessment RUE Assessment RUE Assessment: Within Functional Limits LUE Assessment LUE Assessment: Within Functional Limits     Refer to Care Plan for Long Term Goals  Recommendations for other services: Therapeutic Recreation  Outing/community reintegration   Discharge Criteria: Patient will be discharged from OT if patient refuses treatment 3 consecutive times without  medical reason, if treatment goals not met, if there is a change in medical status, if patient makes no progress towards goals or if patient is discharged from hospital.  The above assessment, treatment plan, treatment alternatives and goals were discussed and mutually agreed upon: by patient  Gypsy Decant 10/08/2019, 12:34 PM

## 2019-10-08 NOTE — Care Management (Signed)
Patient ID: Nathan Proctor., male   DOB: 1941/02/22, 79 y.o.   MRN: [id number] Met with the patient to review role of CM and collaboration with SW (Christina)to facilitate smooth transition to home. Addressed secondary stroke prevention including management of HTN, HLD, DM and stroke prophylaxis; DAPT x 2 for period of time and switch over to ASA solo. Patient reported the information reviewed was not all new; he had been monitoring CBGs PTA. Given handouts for Living well with DM and HTN control along with carbohydrate counting, DASH diet and aspirin and your heart.Continue to follow case and address nursing issues as they arise.

## 2019-10-09 ENCOUNTER — Inpatient Hospital Stay (HOSPITAL_COMMUNITY): Payer: Medicare PPO | Admitting: Physical Therapy

## 2019-10-09 ENCOUNTER — Inpatient Hospital Stay (HOSPITAL_COMMUNITY): Payer: Medicare PPO | Admitting: Occupational Therapy

## 2019-10-09 LAB — GLUCOSE, CAPILLARY
Glucose-Capillary: 104 mg/dL — ABNORMAL HIGH (ref 70–99)
Glucose-Capillary: 133 mg/dL — ABNORMAL HIGH (ref 70–99)
Glucose-Capillary: 117 mg/dL — ABNORMAL HIGH (ref 70–99)
Glucose-Capillary: 137 mg/dL — ABNORMAL HIGH (ref 70–99)

## 2019-10-09 MED ORDER — ENOXAPARIN SODIUM 80 MG/0.8ML ~~LOC~~ SOLN
70.0000 mg | SUBCUTANEOUS | Status: DC
  Administered 2019-10-09 – 2019-10-12 (×4): 70 mg via SUBCUTANEOUS
  Filled 2019-10-09 (×4): qty 0.7

## 2019-10-09 MED ORDER — SODIUM CHLORIDE 0.9 % IV BOLUS
250.0000 mL | Freq: Once | INTRAVENOUS | Status: AC
  Administered 2019-10-09: 250 mL via INTRAVENOUS

## 2019-10-09 NOTE — IPOC Note (Signed)
Overall Plan of Care [facility]) Patient Details Name: Myka Lukins. MRN: [id number] DOB: 02/07/1941  Admitting Diagnosis: Acute ischemic left MCA stroke [facility])  Hospital Problems: Principal Problem:   Acute ischemic left MCA stroke [facility])     Functional Problem List: Nursing Bowel, Bladder, Endurance, Motor, Nutrition, Safety, Skin Integrity, Pain  PT Balance, Safety, Edema, Sensory, Endurance, Motor, Nutrition, Pain, Skin Integrity  OT Balance, Endurance, Motor, Safety  SLP    TR         Basic ADL's: OT Grooming, Bathing, Dressing, Toileting     Advanced  ADL's: OT       Transfers: PT Bed Mobility, Bed to Chair, Car, Manufacturing systems engineer, Metallurgist: PT Ambulation, Stairs     Additional Impairments: OT Fuctional Use of Upper Extremity  SLP        TR      Anticipated Outcomes Item Anticipated Outcome  Self Feeding n/a  Swallowing      Basic self-care  Mod I  Toileting  mod I   Bathroom Transfers Mod I - toileting and  S - shower  Bowel/Bladder  remain continent of bowel and bladder with mod I assist  Transfers  mod-I using LRAD  Locomotion  mod-I uisng LRAD  Communication     Cognition     Pain  Pt will continue to remain free of pain  Safety/Judgment  Pt will follow safety guidelines and remain free of falls and injuries with cues and reminders   Therapy Plan: PT Intensity: Minimum of 1-2 x/day ,45 to 90 minutes PT Frequency: 5 out of 7 days PT Duration Estimated Length of Stay: 7-10days OT Intensity: Minimum of 1-2 x/day, 45 to 90 minutes OT Frequency: 5 out of 7 days OT Duration/Estimated Length of Stay: 7-10 days     Due to the current state of emergency, patients may not be receiving their 3-hours of Medicare-mandated therapy.   Team Interventions: Nursing Interventions Patient/Family Education, Disease Management/Prevention, Pain Management, Medication Management, Bladder Management, Bowel Management, Psychosocial  Support, Discharge Planning, Skin Care/Wound Management  PT interventions Ambulation/gait training, Community reintegration, DME/adaptive equipment instruction, Neuromuscular re-education, Psychosocial support, Stair training, UE/LE Strength taining/ROM, Training and development officer, Discharge planning, Functional electrical stimulation, Pain management, Skin care/wound management, Therapeutic Activities, UE/LE Coordination activities, Cognitive remediation/compensation, Disease management/prevention, Functional mobility training, Patient/family education, Splinting/orthotics, Therapeutic Exercise, Visual/perceptual remediation/compensation  OT Interventions Training and development officer, DME/adaptive equipment instruction, Patient/family education, Therapeutic Activities, Psychosocial support, Therapeutic Exercise, Community reintegration, Functional mobility training, Self Care/advanced ADL retraining, UE/LE Strength taining/ROM, Discharge planning, Neuromuscular re-education, UE/LE Coordination activities  SLP Interventions    TR Interventions    SW/CM Interventions Discharge Planning, Psychosocial Support, Patient/Family Education   Barriers to Discharge MD  Medical stability and Weight  Nursing Inaccessible home environment 3 step entry to home  PT Inaccessible home environment    OT Other (comments) none known at this time  SLP      SW       Team Discharge Planning: Destination: PT-Home ,OT- Home , SLP-  Projected Follow-up: PT-Outpatient PT, 24 hour supervision/assistance, OT-  Outpatient OT, SLP-  Projected Equipment Needs: PT-To be determined, OT- To be determined, SLP-  Equipment Details: PT-has hurrycane, OT-  Patient/family involved in discharge planning: PT- Patient,  OT-Patient, SLP-   MD ELOS: 7-10d Medical Rehab Prognosis:  Excellent Assessment:  79 year old male with history of HTN, T2DM, CKD III, morbid obesity, prostate cancer; who was admitted on 10/03/19 with persistent  right-sided weakness and numbness that started on a.m. on 10/02/2019.  History taken from chart review and patient due to processing.  Blood sugar-57 at admission and TPA not administered due to late presentation.  MRI/MRI brain done revealing acute left MCA infarct affecting left parietal cortex, smooth plaque with 30% stenosis left-ICA bulb, unable to rule out VA origin narrowing/2 mm left periophthalmic aneurysm and no large/medium vessel occlusion.  Dr. Leonie Man felt the stroke was embolic due to unknown source and recommended DAPT x3 weeks followed by ASA alone.    Echocardiogram showing ejection fraction of 45-50% with severe LVH, mild aortic sclerosis, aortic dilatation and global hypokinesis.  He has few episodes of NSVT ~ 16 seconds on 06/08 and cardiology consulted for input. Coreg resumed and repeat echo done revealing global hypokinesis with normal right RV and EF 40-45% with normal systolic function.  No signs of overload and lipitor increased to 80 mg. Dr. Harrington Challenger recommends 3wk Zio patch at discharge and further evaluation after recovery from CVA.  DM managed with SSI and hospital course was complicated by acute on CKD improved with IVF for hydration.   Therapy ongoing and patient limited by weakness, dizziness as well as DOE   Now requiring 24/7 Rehab RN,MD, as well as CIR level PT, OT and SLP.  Treatment team will focus on ADLs and mobility with goals set at Mod I  See Team Conference Notes for weekly updates to the plan of care

## 2019-10-09 NOTE — Progress Notes (Signed)
Physical Therapy Session Note  Patient Details  Name: Nathan Proctor. MRN: [id number] Date of Birth: 1941/01/30  Today's Date: 10/09/2019 PT Individual Time: 1400-1500 and 1620-1650 PT Individual Time Calculation (min): 60 min and 30 min   Short Term Goals: Week 1:  PT Short Term Goal 1 (Week 1): = to LTGs based on ELOS  Skilled Therapeutic Interventions/Progress Updates:    Session 1 Pt received sitting in WC and agreeable to PT. Stand pivot transfer to [facility] with supervision assist and min cues for decreased speed in turn. Pt transported to rehab gym in [facility].  Gait training over level surface without AD and supervision assist x 138f. No LOB noted throughout. Dynamic gait training with no AD to performed figure 8 at 6 ft, forward/reverse and side stepping supervision assist throughout with min cues for step height on the L intermittently.   Patient demonstrates increased fall risk as noted by score of   41/56 on Berg Balance Scale.  (<36= high risk for falls, close to 100%; 37-45 significant >80%; 46-51 moderate >50%; 52-55 lower >25%)  [facility] and all needs met.     Session 2.  Pt received sitting in WC and agreeable to PT. [facility]. Gait training through hall with supervision assist 2 x 1628fand min cues for decreased speed in turns.   PT instructed pt dynamic balance training to perform lateral reach and obtain bean bag, then toss to target. Pt then picked up ben bags from floor 2 x 8 with supervision assist throughout. Giant connect while stand with supervision assist when performing cross body reaches, noted difficulty understand objective of game, regardless of cues from PT.   [facility] returned to [facility] and [facility] and all needs met.        Therapy Documentation Precautions:  Precautions Precautions: Fall Restrictions Weight  Bearing Restrictions: No Pain: denies    Balance: Standardized Balance Assessment Standardized Balance Assessment: Berg Balance Test Berg Balance Test Sit to Stand: Able to stand without using hands and stabilize independently Standing Unsupported: Able to stand safely 2 minutes Sitting with Back Unsupported but Feet Supported on Floor or Stool: Able to sit safely and securely 2 minutes Stand to Sit: Sits safely with minimal use of hands Transfers: Able to transfer safely, minor use of hands Standing Unsupported with Eyes Closed: Able to stand 10 seconds with supervision Standing Ubsupported with Feet Together: Able to place feet together independently and stand for 1 minute with supervision From Standing, Reach Forward with Outstretched Arm: Can reach forward >12 cm safely (5") From Standing Position, Pick up Object from Floor: Able to pick up shoe, needs supervision From Standing Position, Turn to Look Behind Over each Shoulder: Looks behind one side only/other side shows less weight shift Turn 360 Degrees: Able to turn 360 degrees safely but slowly Standing Unsupported, Alternately Place Feet on Step/Stool: Needs assistance to keep from falling or unable to try Standing Unsupported, One Foot in Front: Able to plae foot ahead of the other independently and hold 30 seconds Standing on One Leg: Tries to lift leg/unable to hold 3 seconds but remains standing independently Total Score: 41    Therapy/Group: Individual Therapy  AuLorie Phenix/02/2020, 5:27 PM

## 2019-10-09 NOTE — Progress Notes (Signed)
Occupational Therapy Session Note  Patient Details  Name: Nathan Proctor. MRN: [id number] Date of Birth: 13-Apr-1941  Today's Date: 10/09/2019 OT Individual Time: 6861-6837 OT Individual Time Calculation (min): 27 min    Short Term Goals: Week 1:  OT Short Term Goal 1 (Week 1): STGs=LTGs secondary to estimated short LOS  Skilled Therapeutic Interventions/Progress Updates:    Pt greeted at time of session sitting up in recliner agreeable to OT session to work on standing balance during grooming tasks and functional use of RUE. Pt ambulated with RW to stand in front of sink and completed shaving his face/grooming with supervision for problem solving and overall safety during standing. Pt able to complete with RUE after some encouragement, pt stated he felt like he "couldn't do it" with R hand but was able to complete with encouragement. All to improve GM/FMC in RUE and standing balance/tolerance. Able to tolerate standing approx 8-10 minutes requiring rest break after shaving face. Ambulated back to recliner and up in chair with alarm on, call bell in reach, all needs met.   Therapy Documentation Precautions:  Precautions Precautions: Fall Restrictions Weight Bearing Restrictions: No     Therapy/Group: Individual Therapy  Viona Gilmore 10/09/2019, 3:29 PM

## 2019-10-09 NOTE — Progress Notes (Signed)
[facility] PHYSICAL MEDICINE & REHABILITATION PROGRESS NOTE   Subjective/Complaints:  No issues overnight, discussed elevated creatinine and need to drink more fluid.  Reviewed echo ejection fraction approximately 40%  ROS- neg CP, SOB, N/V/D  Objective:   No results found. Recent Labs    10/07/19 0240 10/08/19 0035  WBC 5.5 5.7  HGB 11.6* 11.0*  HCT 35.7* 34.3*  PLT 175 175   Recent Labs    10/07/19 0240 10/08/19 0035  NA 141 142  K 4.0 4.1  CL 109 108  CO2 26 25  GLUCOSE 106* 117*  BUN 24* 36*  CREATININE 1.47* 2.08*  CALCIUM 9.4 9.3    Intake/Output Summary (Last 24 hours) at 10/09/2019 0856 Last data filed at 10/09/2019 0745 Gross per 24 hour  Intake 480 ml  Output 100 ml  Net 380 ml     Physical Exam: Vital Signs Blood pressure 140/72, pulse 80, temperature 98.9 F (37.2 C), temperature source Oral, resp. rate 17, height 5\' 10"  (1.778 m), weight (!) 137 kg, SpO2 96 %.    General: No acute distress Mood and affect are appropriate Heart: Regular rate and rhythm no rubs murmurs or extra sounds Lungs: Clear to auscultation, breathing unlabored, no rales or wheezes Abdomen: Positive bowel sounds, soft nontender to palpation, nondistended Extremities: No clubbing, cyanosis, or edema Skin: No evidence of breakdown, no evidence of rash Neurologic: Cranial nerves II through XII intact, motor strength is 5/5 in bilateral deltoid, bicep, tricep, grip, hip flexor, knee extensors, ankle dorsiflexor and plantar flexor Sensory exam normal sensation to light touch and proprioception in bilateral upper and lower extremities Cerebellar exam normal finger to nose to finger  Skin: No evidence of breakdown, no evidence of rash Decreased fine motor unable to perform finger to thumb opposition in the RIght hand  Musculoskeletal: Full range of motion in all 4 extremities. No joint swelling   Assessment/Plan: 1. Functional deficits secondary to Left MCA infarct  which  require 3+ hours per day of interdisciplinary therapy in a comprehensive inpatient rehab setting.  Physiatrist is providing close team supervision and 24 hour management of active medical problems listed below.  Physiatrist and rehab team continue to assess barriers to discharge/monitor patient progress toward functional and medical goals  Care Tool:  Bathing    Body parts bathed by patient: Right arm, Left arm, Chest, Abdomen, Front perineal area, Right upper leg, Left upper leg, Face, Buttocks, Right lower leg, Left lower leg   Body parts bathed by helper: Buttocks, Right lower leg, Left lower leg     Bathing assist Assist Level: Contact Guard/Touching assist     Upper Body Dressing/Undressing Upper body dressing   What is the patient wearing?: Pull over shirt    Upper body assist Assist Level: Set up assist    Lower Body Dressing/Undressing Lower body dressing      What is the patient wearing?: Pants, Incontinence brief     Lower body assist Assist for lower body dressing: Contact Guard/Touching assist     Toileting Toileting    Toileting assist Assist for toileting: Minimal Assistance - Patient > 75% Assistive Device Comment: RW at toilet level   Transfers Chair/bed transfer  Transfers assist     Chair/bed transfer assist level: Contact Guard/Touching assist Chair/bed transfer assistive device: Programmer, multimedia   Ambulation assist      Assist level: Contact Guard/Touching assist Assistive device: No Device Max distance: 123ft   Walk 10 feet activity  Assist     Assist level: Contact Guard/Touching assist Assistive device: No Device   Walk 50 feet activity   Assist    Assist level: Contact Guard/Touching assist Assistive device: No Device    Walk 150 feet activity   Assist    Assist level: Contact Guard/Touching assist Assistive device: No Device    Walk 10 feet on uneven surface  activity   Assist      Assist level: Minimal Assistance - Patient > 75% Assistive device: Other (comment) (no UE support)   Wheelchair     Assist Will patient use wheelchair at discharge?: No             Wheelchair 50 feet with 2 turns activity    Assist            Wheelchair 150 feet activity     Assist          Blood pressure 140/72, pulse 80, temperature 98.9 F (37.2 C), temperature source Oral, resp. rate 17, height 5\' 10"  (1.778 m), weight (!) 137 kg, SpO2 96 %.    Medical Problem List and Plan: 1.  Dizziness, DOE, balance deficits secondary to left MCA infarct.             -patient may shower             -ELOS/Goals: 7-10 days/mod I/supervision             PT, OT,SLP CIR level  2.  Antithrombotics: -DVT/anticoagulation:  Pharmaceutical: Heparin             -antiplatelet therapy: DAPT X 3 weeks 3. Pain Management: Neurontin bid for neuropathy. Tylenol prn.  4. Mood: LCSW to follow for evaluation and support.              -antipsychotic agents: N/A  5. Neuropsych: This patient is capable of making decisions on his own behalf. 6. Skin/Wound Care: routine pressure relief measures.  7. Fluids/Electrolytes/Nutrition: Monitor I/O.  CMP ordered for tomorrow a.m. 8. T2DM with neuropathy: Monitor BS ac/hs. Was on 70/30 insulin --30 units daily. Will resume at 5 units in am and titrate upwards.              Monitor with increased mobility CBG (last 3)  Recent Labs    10/08/19 1659 10/08/19 2059 10/09/19 0557  GLUCAP 141* 167* 104*  controlled 10/09/2019  9. CKD III: BUN/SCr- 31/1.87 at admission-->24/1.47. - Acute exacerbation now 36 and 2.08            fluid bolus today  10. HTN: Monitor for orthostatic changes--reports of dizziness with activity. Permissive hypertension--on Coreg, Norvasc             Vitals:   10/08/19 1948 10/09/19 0555  BP: 123/61 140/72  Pulse: 84 80  Resp:    Temp: 99.3 F (37.4 C) 98.9 F (37.2 C)  SpO2: 100% 96%  controlled 6/11 11.  NSVT: Asymptomatic. Back on Coreg and to follow up on outpatient basis.              Monitor heart rate with increased exertion 12. Morbid obesity-BMI 44.7:  Educate on appropriate diet and importance of weight loss to help promote mobility and helth.  13.. H/o gout: Resume allopurinol  14. Dyslipidemia: Now on high dose statin.  15.  Anemia of chronic disease?:  CBC ordered  LOS: 2 days A FACE TO FACE EVALUATION WAS PERFORMED  Charlett Blake 10/09/2019, 8:56 AM

## 2019-10-09 NOTE — Plan of Care (Signed)
°  Problem: RH SAFETY Goal: RH STG ADHERE TO SAFETY PRECAUTIONS W/ASSISTANCE/DEVICE Description: STG Adhere to Safety Precautions With cues and reminders Outcome: Progressing   Problem: RH PAIN MANAGEMENT Goal: RH STG PAIN MANAGED AT OR BELOW PT'S PAIN GOAL Description: Pt. Will have pain level managed at a level 4 or less out of 10. Outcome: Progressing

## 2019-10-09 NOTE — Progress Notes (Signed)
Progress Note  Patient Name: Nathan Proctor. Date of Encounter: 10/09/2019  [facility] HeartCare Cardiologist: New   Subjective   NO CP   NO SOB   Inpatient Medications    Scheduled Meds:  allopurinol  100 mg Oral Daily   aspirin EC  81 mg Oral Daily   atorvastatin  80 mg Oral Daily   carvedilol  12.5 mg Oral BID WC   clopidogrel  75 mg Oral Daily   enoxaparin (LOVENOX) injection  70 mg Subcutaneous Q24H   insulin aspart  0-5 Units Subcutaneous QHS   insulin aspart  0-9 Units Subcutaneous TID WC   insulin aspart protamine- aspart  5 Units Subcutaneous Q breakfast   Continuous Infusions:  PRN Meds:    Vital Signs    Vitals:   10/08/19 1337 10/08/19 1948 10/09/19 0555 10/09/19 1304  BP: 118/72 123/61 140/72 136/67  Pulse: 79 84 80 66  Resp: 17   18  Temp: 98.4 F (36.9 C) 99.3 F (37.4 C) 98.9 F (37.2 C) 97.9 F (36.6 C)  TempSrc: Oral Oral Oral Oral  SpO2: 99% 100% 96% 100%  Weight:      Height:        Intake/Output Summary (Last 24 hours) at 10/09/2019 1356 Last data filed at 10/09/2019 0745 Gross per 24 hour  Intake 480 ml  Output 100 ml  Net 380 ml   Last 3 Weights 10/07/2019 10/07/2019 10/04/2019  Weight (lbs) 302 lb 0.5 oz 302 lb 0.5 oz 312 lb 2.7 oz  Weight (kg) 137 kg 137 kg 141.6 kg      Telemetry    SR  PVC - Personally Reviewed  ECG    None - Personally Reviewed  Physical Exam   GEN: Morbidly obese 79 yo in  No acute distress.   Neck: No JVD Cardiac: RRR, no murmurs, rubs, or gallops.  Respiratory: Clear to auscultation bilaterally. GI: Soft, nontender, non-distended  MS: No edema; No deformity. Neuro:  Nonfocal  Psych: Normal affect   Labs    High Sensitivity Troponin:  No results for input(s): TROPONINIHS in the last 720 hours.    Chemistry Recent Labs  Lab 10/06/19 0356 10/07/19 0240 10/08/19 0035  NA 140 141 142  K 3.8 4.0 4.1  CL 105 109 108  CO2 24 26 25   GLUCOSE 110* 106* 117*  BUN 21 24* 36*    CREATININE 1.36* 1.47* 2.08*  CALCIUM 9.3 9.4 9.3  PROT 6.4* 6.4* 6.4*  ALBUMIN 3.4* 3.4* 3.5  AST 15 18 20   ALT 12 12 18   ALKPHOS 68 69 68  BILITOT 1.0 1.0 1.1  GFRNONAA 49* 44* 29*  GFRAA 57* 51* 34*  ANIONGAP 11 6 9      Hematology Recent Labs  Lab 10/06/19 0356 10/07/19 0240 10/08/19 0035  WBC 5.6 5.5 5.7  RBC 3.62* 3.76* 3.59*  HGB 11.1* 11.6* 11.0*  HCT 34.1* 35.7* 34.3*  MCV 94.2 94.9 95.5  MCH 30.7 30.9 30.6  MCHC 32.6 32.5 32.1  RDW 13.3 13.2 13.3  PLT 176 175 175    BNPNo results for input(s): BNP, PROBNP in the last 168 hours.   DDimer No results for input(s): DDIMER in the last 168 hours.   Radiology    No results found.  Cardiac Studies     Patient Profile     79 y.o. male admitted with CVA   Asked to see re NSVT   Assessment & Plan    1  NSVT  Pt on b blocker  No symptoms  Plan for 30 day event monitor     2  Cardiomyopathy    LVEF is down   I have reviewed echo.  Even with Definity it is difficult    I am not convinced it is as hypertrophied as noted on initial echo.   LVEF is probably mildly depressed   With labile renal function I would hold off on other meds for right now  Do not want hypotension Keep on carvedilol REpeat BMET in AM    Will make sure he has f/u  or CVAFor questions or updates, please contact Luke Please consult [email or web address] for contact info under        Signed, Dorris Carnes, MD  10/09/2019, 1:56 PM

## 2019-10-09 NOTE — Progress Notes (Signed)
Pt's BMI is 43. Ok to increase his DVT prophylaxis lovenox to 0.5mg /kg/day per Dr. Letta Pate.  Lovenox 70mg  SQ qday F/u with scr in AM  Onnie Boer, PharmD, BCIDP, AAHIVP, CPP Infectious Disease Pharmacist 10/09/2019 10:11 AM

## 2019-10-09 NOTE — Progress Notes (Signed)
Occupational Therapy Session Note  Patient Details  Name: Nathan Proctor. MRN: [id number] Date of Birth: 1940/08/04  Today's Date: 10/09/2019 OT Individual Time: 8832-5498 OT Individual Time Calculation (min): 55 min    Short Term Goals: Week 1:  OT Short Term Goal 1 (Week 1): STGs=LTGs secondary to estimated short LOS  Skilled Therapeutic Interventions/Progress Updates:    Upon entering the room, pt supine in bed sleeping soundly with no c/o pain. Pt requesting to not have therapy scheduled at this time again as he would like to eat breakfast before starting therapeutic intervention. Pt was agreeable to this session. Supine >sit with supervision to EOB. Pt performed  Bathing and dressing tasks with sit <> stand from EOB with supervision/set up A overall. Pt declined toileting this session and transferred into recliner chair with min guard without use of AD. Pt began eating breakfast and OT provided education regarding energy conservation for self care tasks, IADLs, and community mobility. Pt asking appropriate questions and OT providing with several examples to consider for home environment. Pt remained in recliner chair with chair alarm pad activated. Call bell and all needed items within reach upon exiting the room.   Therapy Documentation Precautions:  Precautions Precautions: Fall Restrictions Weight Bearing Restrictions: No General:   Vital Signs: Therapy Vitals Temp: 98.9 F (37.2 C) Temp Source: Oral Pulse Rate: 80 BP: 140/72 Patient Position (if appropriate): Lying Oxygen Therapy SpO2: 96 % O2 Device: Room Air   Therapy/Group: Individual Therapy  Gypsy Decant 10/09/2019, 7:59 AM

## 2019-10-10 ENCOUNTER — Inpatient Hospital Stay (HOSPITAL_COMMUNITY): Payer: Medicare PPO | Admitting: Physical Therapy

## 2019-10-10 ENCOUNTER — Inpatient Hospital Stay (HOSPITAL_COMMUNITY): Payer: Medicare PPO | Admitting: Occupational Therapy

## 2019-10-10 LAB — GLUCOSE, CAPILLARY
Glucose-Capillary: 93 mg/dL (ref 70–99)
Glucose-Capillary: 143 mg/dL — ABNORMAL HIGH (ref 70–99)
Glucose-Capillary: 127 mg/dL — ABNORMAL HIGH (ref 70–99)
Glucose-Capillary: 174 mg/dL — ABNORMAL HIGH (ref 70–99)

## 2019-10-10 LAB — COMPREHENSIVE METABOLIC PANEL
ALT: 24 U/L (ref 0–44)
AST: 22 U/L (ref 15–41)
Albumin: 3.2 g/dL — ABNORMAL LOW (ref 3.5–5.0)
Alkaline Phosphatase: 63 U/L (ref 38–126)
Anion gap: 10 (ref 5–15)
BUN: 32 mg/dL — ABNORMAL HIGH (ref 8–23)
CO2: 23 mmol/L (ref 22–32)
Calcium: 9 mg/dL (ref 8.9–10.3)
Chloride: 106 mmol/L (ref 98–111)
Creatinine, Ser: 1.5 mg/dL — ABNORMAL HIGH (ref 0.61–1.24)
GFR calc Af Amer: 50 mL/min — ABNORMAL LOW (ref >60)
GFR calc non Af Amer: 43 mL/min — ABNORMAL LOW (ref >60)
Glucose, Bld: 98 mg/dL (ref 70–99)
Potassium: 4.2 mmol/L (ref 3.5–5.1)
Sodium: 139 mmol/L (ref 135–145)
Total Bilirubin: 0.9 mg/dL (ref 0.3–1.2)
Total Protein: 5.8 g/dL — ABNORMAL LOW (ref 6.5–8.1)

## 2019-10-10 MED ORDER — LOSARTAN POTASSIUM 50 MG PO TABS
25.0000 mg | ORAL_TABLET | Freq: Every day | ORAL | Status: DC
  Administered 2019-10-10 – 2019-10-12 (×3): 25 mg via ORAL
  Filled 2019-10-10 (×3): qty 1

## 2019-10-10 MED ORDER — SODIUM CHLORIDE 0.45 % IV SOLN: INTRAVENOUS | Status: DC

## 2019-10-10 NOTE — Progress Notes (Addendum)
Progress Note  Patient Name: Nathan Proctor. Date of Encounter: 10/10/2019  [facility] Cardiologist: New   Subjective   No CP   No SOB   Inpatient Medications    Scheduled Meds: . allopurinol  100 mg Oral Daily  . aspirin EC  81 mg Oral Daily  . atorvastatin  80 mg Oral Daily  . carvedilol  12.5 mg Oral BID WC  . clopidogrel  75 mg Oral Daily  . enoxaparin (LOVENOX) injection  70 mg Subcutaneous Q24H  . insulin aspart  0-5 Units Subcutaneous QHS  . insulin aspart  0-9 Units Subcutaneous TID WC  . insulin aspart protamine- aspart  5 Units Subcutaneous Q breakfast   Continuous Infusions:  PRN Meds:    Vital Signs    Vitals:   10/09/19 0555 10/09/19 1304 10/09/19 1957 10/10/19 0446  BP: 140/72 136/67 128/74 (!) 144/74  Pulse: 80 66 78 83  Resp:  18    Temp: 98.9 F (37.2 C) 97.9 F (36.6 C) 98.2 F (36.8 C) 98.9 F (37.2 C)  TempSrc: Oral Oral Oral Oral  SpO2: 96% 100% 99% 95%  Weight:      Height:        Intake/Output Summary (Last 24 hours) at 10/10/2019 2423 Last data filed at 10/10/2019 0443 Gross per 24 hour  Intake 600 ml  Output 1000 ml  Net -400 ml   Last 3 Weights 10/07/2019 10/07/2019 10/04/2019  Weight (lbs) 302 lb 0.5 oz 302 lb 0.5 oz 312 lb 2.7 oz  Weight (kg) 137 kg 137 kg 141.6 kg      Telemetry    None - Personally Reviewed  ECG    None - Personally Reviewed  Physical Exam   GEN: Morbidly obese 79 yo in  No acute distress.   Neck: JVP is normal   Cardiac: RRR, no murmurs.  Respiratory: Clear to auscultation bilaterally. GI: Soft, nontender, non-distended  MS: No edema; No deformity. Neuro:  Nonfocal  Psych: Normal affect   Labs    High Sensitivity Troponin:  No results for input(s): TROPONINIHS in the last 720 hours.    Chemistry Recent Labs  Lab 10/07/19 0240 10/08/19 0035 10/10/19 0512  NA 141 142 139  K 4.0 4.1 4.2  CL 109 108 106  CO2 26 25 23   GLUCOSE 106* 117* 98  BUN 24* 36* 32*  CREATININE 1.47*  2.08* 1.50*  CALCIUM 9.4 9.3 9.0  PROT 6.4* 6.4* 5.8*  ALBUMIN 3.4* 3.5 3.2*  AST 18 20 22   ALT 12 18 24   ALKPHOS 69 68 63  BILITOT 1.0 1.1 0.9  GFRNONAA 44* 29* 43*  GFRAA 51* 34* 50*  ANIONGAP 6 9 10      Hematology Recent Labs  Lab 10/06/19 0356 10/07/19 0240 10/08/19 0035  WBC 5.6 5.5 5.7  RBC 3.62* 3.76* 3.59*  HGB 11.1* 11.6* 11.0*  HCT 34.1* 35.7* 34.3*  MCV 94.2 94.9 95.5  MCH 30.7 30.9 30.6  MCHC 32.6 32.5 32.1  RDW 13.3 13.2 13.3  PLT 176 175 175    BNPNo results for input(s): BNP, PROBNP in the last 168 hours.   DDimer No results for input(s): DDIMER in the last 168 hours.   Radiology    No results found.  Cardiac Studies     Patient Profile     79 y.o. male admitted with CVA   Asked to see re NSVT   Assessment & Plan    1   NSVT  Pt on b blocker now Never had symptoms Plan for 30 day event monitor     2  Cardiomyopathy    LVEF is down   I have reviewed echo.  Even with Definity it is difficult Prob 45%     I am not convinced it is as hypertrophied as noted on initial echo. Pt on b blocker   Will add low dose ARB for BP control/ afterload reduction / renal   Watch renal function and BP  Pt will need outpt f/u for long run.   Poss ischemic eval at some pt when recovers .   3  HL   On lipitor   LDL 105  Goal 70    For questions or updates, please contact [place] Please consult [email or web address] for contact info under        Signed, Dorris Carnes, MD  10/10/2019, 6:39 AM

## 2019-10-10 NOTE — Progress Notes (Signed)
Physical Therapy Session Note  Patient Details  Name: Nathan Proctor. MRN: [id number] Date of Birth: 1941/04/28  Today's Date: 10/10/2019 PT Individual Time: 4580-9983 and 1650-1730 PT Individual Time Calculation (min): 55 min and 40   Short Term Goals: Week 1:  PT Short Term Goal 1 (Week 1): = to LTGs based on ELOS  Skilled Therapeutic Interventions/Progress Updates:  Session 1  Pt received sitting in recliner and agreeable to PT. Stand pivot transfer to [facility] with supervision assist and no AD.   Pt transpotred to rehab gym. Dynamic balance training to stand on red and blue wedges to compelte medium difficulty pipe tree puzle with CGA-supervision assist. Diona Foley toss of trampoline from flat surface forward/R and L with supervision assist for safety and improve for trunk rotation with lateral toss.   Gait training in rehab gym x 274f with supervision assist from PT for safety and min cues for safety in turns throughout. Borg RPE 12/20  nustep reciprocal movement training x5 min, level 6, with cues for decreased speed below 70 to prevent excessive fatigue. [facility] on and off nustep for safety and to provide cues for use of UE to push off WC.   Patient returned to room and left sitting in recliner with call bell in reach and all needs met.     Session 2.   Pt received sitting in recliner and agreeable to PT. [facility] placement of IV pole. Pt transported to atrium of hospital. Gait training with supervision assist for safety and to manage IV pole 2x 250. Min cues for safety in tight spaces and to improve step height Bil when fatigue.   PT instructed pt in modified [place] strengthening program standing in parallel. HS curls, hip abduction, calf raise, mini squats. X 10 BLE with cues for full ROM and proper speed.  Stand pivot transfer to recliner with no AD and supervision  assist. Patient returned to room and left sitting in recliner  with call bell in reach and all needs met.          Therapy Documentation Precautions:  Precautions Precautions: Fall Restrictions Weight Bearing Restrictions: No    Vital Signs: Therapy Vitals Temp: 98 F (36.7 C) Temp Source: Oral Pulse Rate: 70 Resp: 16 BP: 120/66 Patient Position (if appropriate): Sitting Oxygen Therapy SpO2: 100 % O2 Device: Room Air Pain: denies   Therapy/Group: Individual Therapy  ALorie Phenix6/03/2020, 5:48 PM

## 2019-10-10 NOTE — Progress Notes (Signed)
[facility] PHYSICAL MEDICINE & REHABILITATION PROGRESS NOTE   Subjective/Complaints:  C/o of dizziness the last few days. Also reports some pain when he swallows,moves neck. Swallowing solids or liquids doesn't really affect pain  ROS: Patient denies fever, rash, sore throat, blurred vision, nausea, vomiting, diarrhea, cough, shortness of breath or chest pain, joint or back pain, headache, or mood change.   Objective:   No results found. Recent Labs    10/08/19 0035  WBC 5.7  HGB 11.0*  HCT 34.3*  PLT 175   Recent Labs    10/08/19 0035 10/10/19 0512  NA 142 139  K 4.1 4.2  CL 108 106  CO2 25 23  GLUCOSE 117* 98  BUN 36* 32*  CREATININE 2.08* 1.50*  CALCIUM 9.3 9.0    Intake/Output Summary (Last 24 hours) at 10/10/2019 1156 Last data filed at 10/10/2019 0900 Gross per 24 hour  Intake 600 ml  Output 1000 ml  Net -400 ml     Physical Exam: Vital Signs Blood pressure (!) 144/74, pulse 83, temperature 98.9 F (37.2 C), temperature source Oral, resp. rate 18, height 5\' 10"  (1.778 m), weight (!) 137 kg, SpO2 95 %.    Constitutional: No distress . Vital signs reviewed. HEENT: EOMI, oral membranes moist Neck: left neck sl tender along anterior SCM, lymph node palpable,tender Cardiovascular: RRR without murmur. No JVD    Respiratory/Chest: CTA Bilaterally without wheezes or rales. Normal effort    GI/Abdomen: BS +, non-tender, non-distended Ext: no clubbing, cyanosis, or edema Psych: pleasant and cooperative  Skin: No evidence of breakdown, no evidence of rash Neurologic: no nystagmus, good sitting balance. Cranial nerves II through XII intact, motor strength is 5/5 in bilateral deltoid, bicep, tricep, grip, hip flexor, knee extensors, ankle dorsiflexor and plantar flexor Sensory exam normal sensation to light touch and proprioception in bilateral upper and lower extremities Cerebellar exam normal finger to nose to finger  Skin: No evidence of breakdown, no evidence  of rash Decreased FMC RUE  Musculoskeletal: Full range of motion in all 4 extremities. No joint swelling   Assessment/Plan: 1. Functional deficits secondary to Left MCA infarct  which require 3+ hours per day of interdisciplinary therapy in a comprehensive inpatient rehab setting.  Physiatrist is providing close team supervision and 24 hour management of active medical problems listed below.  Physiatrist and rehab team continue to assess barriers to discharge/monitor patient progress toward functional and medical goals  Care Tool:  Bathing    Body parts bathed by patient: Right arm, Left arm, Chest, Abdomen, Front perineal area, Right upper leg, Left upper leg, Face, Buttocks, Right lower leg, Left lower leg   Body parts bathed by helper: Buttocks, Right lower leg, Left lower leg     Bathing assist Assist Level: Contact Guard/Touching assist     Upper Body Dressing/Undressing Upper body dressing   What is the patient wearing?: Pull over shirt    Upper body assist Assist Level: Set up assist    Lower Body Dressing/Undressing Lower body dressing      What is the patient wearing?: Underwear/pull up, Pants     Lower body assist Assist for lower body dressing: Contact Guard/Touching assist     Toileting Toileting    Toileting assist Assist for toileting: Contact Guard/Touching assist Assistive Device Comment: urinal   Transfers Chair/bed transfer  Transfers assist     Chair/bed transfer assist level: Supervision/Verbal cueing Chair/bed transfer assistive device: Programmer, multimedia   Ambulation assist  Assist level: Supervision/Verbal cueing Assistive device: No Device Max distance: 160   Walk 10 feet activity   Assist     Assist level: Supervision/Verbal cueing Assistive device: No Device   Walk 50 feet activity   Assist    Assist level: Supervision/Verbal cueing Assistive device: No Device    Walk 150 feet  activity   Assist    Assist level: Supervision/Verbal cueing Assistive device: No Device    Walk 10 feet on uneven surface  activity   Assist     Assist level: Minimal Assistance - Patient > 75% Assistive device: Other (comment) (no UE support)   Wheelchair     Assist Will patient use wheelchair at discharge?: No   Wheelchair activity did not occur: N/A         Wheelchair 50 feet with 2 turns activity    Assist    Wheelchair 50 feet with 2 turns activity did not occur: N/A       Wheelchair 150 feet activity     Assist  Wheelchair 150 feet activity did not occur: N/A       Blood pressure (!) 144/74, pulse 83, temperature 98.9 F (37.2 C), temperature source Oral, resp. rate 18, height 5\' 10"  (1.778 m), weight (!) 137 kg, SpO2 95 %.    Medical Problem List and Plan: 1.  Dizziness, DOE, balance deficits secondary to left MCA infarct.             -patient may shower             -ELOS/Goals: 7-10 days/mod I/supervision             PT, OT,SLP CIR level  2.  Antithrombotics: -DVT/anticoagulation:  Pharmaceutical: Heparin             -antiplatelet therapy: DAPT X 3 weeks 3. Pain Management: Neurontin bid for neuropathy. Tylenol prn.  4. Mood: LCSW to follow for evaluation and support.              -antipsychotic agents: N/A  5. Neuropsych: This patient is capable of making decisions on his own behalf. 6. Skin/Wound Care: routine pressure relief measures.  7. Fluids/Electrolytes/Nutrition: Monitor I/O.  CMP ordered for tomorrow a.m. 8. T2DM with neuropathy: Monitor BS ac/hs. Was on 70/30 insulin --30 units daily. Will resume at 5 units in am and titrate upwards.              Monitor with increased mobility CBG (last 3)  Recent Labs    10/09/19 2114 10/10/19 0557 10/10/19 1126  GLUCAP 137* 93 143*  controlled 6/11-12  9. CKD III: BUN/SCr- 31/1.87 at admission-->24/1.47.   - Acute exacerbation now 36 and 2.08-->32/1.5 on 6/12   -suspect this  is responsible for dizziness he describes   -received IVF bolus yesterday, will run another liter in him today   -recheck labs tomorrow            10. HTN: Monitor for orthostatic changes--reports of dizziness with activity. Permissive hypertension--on Coreg, Norvasc             Vitals:   10/09/19 1957 10/10/19 0446  BP: 128/74 (!) 144/74  Pulse: 78 83  Resp:    Temp: 98.2 F (36.8 C) 98.9 F (37.2 C)  SpO2: 99% 95%  controlled 6/12 11. NSVT: Asymptomatic. Back on Coreg and to follow up on outpatient basis.              Monitor heart rate with increased exertion 12.  Morbid obesity-BMI 44.7:  Educate on appropriate diet and importance of weight loss to help promote mobility and helth.  13.. H/o gout: Resume allopurinol  14. Dyslipidemia: Now on high dose statin.  15.  Anemia of chronic disease?:  CBC ordered  LOS: 3 days A FACE TO FACE EVALUATION WAS PERFORMED  Meredith Staggers 10/10/2019, 11:56 AM

## 2019-10-10 NOTE — Plan of Care (Signed)
  Problem: RH SAFETY Goal: RH STG ADHERE TO SAFETY PRECAUTIONS W/ASSISTANCE/DEVICE Description: STG Adhere to Safety Precautions With cues and reminders Outcome: Progressing

## 2019-10-10 NOTE — Progress Notes (Signed)
Occupational Therapy Session Note  Patient Details  Name: Nathan Proctor. MRN: [id number] Date of Birth: [birth year]  Today's Date: 10/10/2019 OT Individual Time: 1975-8832 OT Individual Time Calculation (min): 89 min   Short Term Goals: Week 1:  OT Short Term Goal 1 (Week 1): STGs=LTGs secondary to estimated short LOS  Skilled Therapeutic Interventions/Progress Updates:    Pt greeted EOB with no c/o pain. Requesting to engage in several ADL tasks this morning. He completed toileting (using standard toilet, B+B void), bathing (at shower level, sit<stand), dressing (sit<stand from recliner using RW), oral care (standing at sink), and bedmaking tasks (sitting in recliner) during session. Tx focus placed on Rt NMR, dynamic standing balance, and ADL retraining. All functional transfers completed using RW with CGA and vcs for positioning of device prior to sitting on transfer surfaces. He was mindful of using his Rt hand at dominant level throughout session, required increased time to meet fine motor demands of task (I.e. uncapping/capping soap bottle, removing plastic wrap from toothbrush, capping/uncapping toothpaste), but able to meet demands unassisted. Close supervision for dynamic sitting tasks and CGA for dynamic standing activity overall. To work on fine motor coordination of the Rt hand, pt donned 4 pillowcases on pillows for his bed. At end of session pt remained comfortably in his recliner, able to elevate legs and lower them using the chair feature with his Rt hand, chair alarm activated and all needs within reach.   Therapy Documentation Precautions:  Precautions Precautions: Fall Restrictions Weight Bearing Restrictions: No Vital Signs:  Pain:   ADL:       Therapy/Group: Individual Therapy  [name] [name] 10/10/2019, 12:50 PM

## 2019-10-11 ENCOUNTER — Inpatient Hospital Stay (HOSPITAL_COMMUNITY): Payer: Medicare PPO | Admitting: Occupational Therapy

## 2019-10-11 LAB — BASIC METABOLIC PANEL
Anion gap: 9 (ref 5–15)
BUN: 28 mg/dL — ABNORMAL HIGH (ref 8–23)
CO2: 23 mmol/L (ref 22–32)
Calcium: 8.8 mg/dL — ABNORMAL LOW (ref 8.9–10.3)
Chloride: 105 mmol/L (ref 98–111)
Creatinine, Ser: 1.46 mg/dL — ABNORMAL HIGH (ref 0.61–1.24)
GFR calc Af Amer: 52 mL/min — ABNORMAL LOW (ref >60)
GFR calc non Af Amer: 45 mL/min — ABNORMAL LOW (ref >60)
Glucose, Bld: 108 mg/dL — ABNORMAL HIGH (ref 70–99)
Potassium: 4 mmol/L (ref 3.5–5.1)
Sodium: 137 mmol/L (ref 135–145)

## 2019-10-11 LAB — GLUCOSE, CAPILLARY
Glucose-Capillary: 97 mg/dL (ref 70–99)
Glucose-Capillary: 133 mg/dL — ABNORMAL HIGH (ref 70–99)
Glucose-Capillary: 137 mg/dL — ABNORMAL HIGH (ref 70–99)
Glucose-Capillary: 198 mg/dL — ABNORMAL HIGH (ref 70–99)

## 2019-10-11 NOTE — Progress Notes (Signed)
Occupational Therapy Session Note  Patient Details  Name: Nathan Proctor. MRN: [id number] Date of Birth: 12-19-1940  Today's Date: 10/11/2019 OT Individual Time: 1300-1355 OT Individual Time Calculation (min): 55 min   Short Term Goals: Week 1:  OT Short Term Goal 1 (Week 1): STGs=LTGs secondary to estimated short LOS  Skilled Therapeutic Interventions/Progress Updates:    Pt greeted in bed with no c/o pain, getting up to eat his lunch. Supine<sit from flat bed without bedrail completed at independent level. While he ate his meal, discussed d/c plans, DME needs, f/u, and OT provided him with 2 printouts regarding HEPs for his Rt hand. We reviewed them together with pt having no further questions. He wanted to get dressed, doffed soiled brief and then stated he needed to have a BM. Ambulatory transfer to toilet completed with supervision assistance using RW. Pt had continent B+B void and completed perihygiene with setup assistance. When ambulating out of the bathroom pt with 1 small LOB towards the Rt side, CGA to correct. Supervision/setup for dressing EOB at sit<stand level using RW for balance support after. To finish session, pt completed oral care while standing at the sink at Mod I level. Pt remained in the recliner at close of session, all needs within reach and chair alarm set.   Therapy Documentation Precautions:  Precautions Precautions: Fall Restrictions Weight Bearing Restrictions: No Pain:   ADL:     Therapy/Group: Individual Therapy  Eisley Barber A Zakariyya Helfman 10/11/2019, 3:40 PM

## 2019-10-11 NOTE — Plan of Care (Signed)
  Problem: Consults Goal: RH STROKE PATIENT EDUCATION Description: See Patient Education module for education specifics  Outcome: Progressing Goal: Diabetes Guidelines if Diabetic/Glucose > 140 Description: If diabetic or lab glucose is > 140 mg/dl - Initiate Diabetes/Hyperglycemia Guidelines & Document Interventions  Outcome: Progressing   Problem: RH SKIN INTEGRITY Goal: RH STG SKIN FREE OF INFECTION/BREAKDOWN Description: Skin will remain free of infection and breakdown with Mod I assist.  Outcome: Progressing Goal: RH STG MAINTAIN SKIN INTEGRITY WITH ASSISTANCE Description: STG Maintain Skin Integrity With mod I Assistance. Outcome: Progressing   Problem: RH SAFETY Goal: RH STG ADHERE TO SAFETY PRECAUTIONS W/ASSISTANCE/DEVICE Description: STG Adhere to Safety Precautions With cues and reminders Outcome: Progressing

## 2019-10-11 NOTE — Progress Notes (Signed)
Progress Note  Patient Name: Nathan Proctor. Date of Encounter: 10/11/2019  [facility] Cardiologist: New   Subjective   No CP   Breathing OK   Was a little dizzy yesterday   Not now .    Inpatient Medications    Scheduled Meds: . allopurinol  100 mg Oral Daily  . aspirin EC  81 mg Oral Daily  . atorvastatin  80 mg Oral Daily  . carvedilol  12.5 mg Oral BID WC  . clopidogrel  75 mg Oral Daily  . enoxaparin (LOVENOX) injection  70 mg Subcutaneous Q24H  . insulin aspart  0-5 Units Subcutaneous QHS  . insulin aspart  0-9 Units Subcutaneous TID WC  . insulin aspart protamine- aspart  5 Units Subcutaneous Q breakfast  . losartan  25 mg Oral Daily   Continuous Infusions: . sodium chloride 75 mL/hr at 10/11/19 0430   PRN Meds:    Vital Signs    Vitals:   10/10/19 0446 10/10/19 1554 10/10/19 1940 10/11/19 0429  BP: (!) 144/74 120/66 133/62 124/60  Pulse: 83 70 69 71  Resp:  16 18 17   Temp: 98.9 F (37.2 C) 98 F (36.7 C) 98.3 F (36.8 C) 98.1 F (36.7 C)  TempSrc: Oral Oral Oral Oral  SpO2: 95% 100% 99% 100%  Weight:      Height:        Intake/Output Summary (Last 24 hours) at 10/11/2019 9449 Last data filed at 10/11/2019 0430 Gross per 24 hour  Intake 1676.24 ml  Output 800 ml  Net 876.24 ml   Last 3 Weights 10/07/2019 10/07/2019 10/04/2019  Weight (lbs) 302 lb 0.5 oz 302 lb 0.5 oz 312 lb 2.7 oz  Weight (kg) 137 kg 137 kg 141.6 kg      Telemetry    None - Personally Reviewed  ECG    None - Personally Reviewed  Physical Exam   GEN: Morbidly obese 79 yo in  No acute distress.   Neck: JVP is not elevated  Cardiac: RRR, no murmurs.  Respiratory: Clear to auscultation bilaterally. GI: Soft, nontender, non-distended  MS: No edema; No deformity. Neuro:  Nonfocal  Psych: Normal affect   Labs    High Sensitivity Troponin:  No results for input(s): TROPONINIHS in the last 720 hours.    Chemistry Recent Labs  Lab 10/07/19 0240 10/07/19 0240  10/08/19 0035 10/10/19 0512 10/11/19 0538  NA 141   < > 142 139 137  K 4.0   < > 4.1 4.2 4.0  CL 109   < > 108 106 105  CO2 26   < > 25 23 23   GLUCOSE 106*   < > 117* 98 108*  BUN 24*   < > 36* 32* 28*  CREATININE 1.47*   < > 2.08* 1.50* 1.46*  CALCIUM 9.4   < > 9.3 9.0 8.8*  PROT 6.4*  --  6.4* 5.8*  --   ALBUMIN 3.4*  --  3.5 3.2*  --   AST 18  --  20 22  --   ALT 12  --  18 24  --   ALKPHOS 69  --  68 63  --   BILITOT 1.0  --  1.1 0.9  --   GFRNONAA 44*   < > 29* 43* 45*  GFRAA 51*   < > 34* 50* 52*  ANIONGAP 6   < > 9 10 9    < > = values in this interval not  displayed.     Hematology Recent Labs  Lab 10/06/19 0356 10/07/19 0240 10/08/19 0035  WBC 5.6 5.5 5.7  RBC 3.62* 3.76* 3.59*  HGB 11.1* 11.6* 11.0*  HCT 34.1* 35.7* 34.3*  MCV 94.2 94.9 95.5  MCH 30.7 30.9 30.6  MCHC 32.6 32.5 32.1  RDW 13.3 13.2 13.3  PLT 176 175 175    BNPNo results for input(s): BNP, PROBNP in the last 168 hours.   DDimer No results for input(s): DDIMER in the last 168 hours.   Radiology    No results found.  Cardiac Studies     Patient Profile     79 y.o. male admitted with CVA   Asked to see re NSVT   Assessment & Plan    1   NSVT  Pt on b blocker now Never had symptoms Plan for 30 day event monitor  His BP and HR have both been OK on current dose.   WOuld keep at this     2  Cardiomyopathy    LVEF is down   I have reviewed echo.  Even with Definity it is difficult Prob 45%     I am not convinced it is as hypertrophied as noted on initial echo. Pt on b blocker     Yesterday he had some dizziness.   BUN/CR 32/1.5  This was down for 36 and 2.1 a couple days before   He was started on IV fluids    BP has been approx 120/ throughout  I have recomm stopping IV fluids   IF he continues having dizziness then I would recomm orthostatic check to document before starting    I will stop lisinopril   Continue b blocker  3  HL   On lipitor   LDL 105  GOal 70   Will follow as outpt     Will set up to see pt after d/c in cardiology clinic   LVEF and PVCs will need to be followed.  Ischemic eval. And event monitor  Possible loop  Will be available in hosp as needed if he has recurrent dizziness  Will sign off for now  Call with questions    For questions or updates, please contact [facility] Please consult [email or web address] for contact info under        Signed, Dorris Carnes, MD  10/11/2019, 7:07 AM

## 2019-10-11 NOTE — Progress Notes (Addendum)
[place] PHYSICAL MEDICINE & REHABILITATION PROGRESS NOTE   Subjective/Complaints:  Pt feeling better today. Able to sleep last night  ROS: Patient denies fever, rash, sore throat, blurred vision, nausea, vomiting, diarrhea, cough, shortness of breath or chest pain, joint or back pain, headache, or mood change.   Objective:   No results found. No results for input(s): WBC, HGB, HCT, PLT in the last 72 hours. Recent Labs    10/10/19 0512 10/11/19 0538  NA 139 137  K 4.2 4.0  CL 106 105  CO2 23 23  GLUCOSE 98 108*  BUN 32* 28*  CREATININE 1.50* 1.46*  CALCIUM 9.0 8.8*    Intake/Output Summary (Last 24 hours) at 10/11/2019 1028 Last data filed at 10/11/2019 0430 Gross per 24 hour  Intake 1436.24 ml  Output 800 ml  Net 636.24 ml     Physical Exam: Vital Signs Blood pressure 128/67, pulse 74, temperature 98.1 F (36.7 C), temperature source Oral, resp. rate 17, height 5\' 10"  (1.778 m), weight (!) 137 kg, SpO2 100 %.    Constitutional: No distress . Vital signs reviewed. HEENT: EOMI, oral membranes moist Neck: supple Cardiovascular: RRR without murmur. No JVD    Respiratory/Chest: CTA Bilaterally without wheezes or rales. Normal effort    GI/Abdomen: BS +, non-tender, non-distended Ext: no clubbing, cyanosis, or edema Psych: pleasant and cooperative Skin: No evidence of breakdown, no evidence of rash Neurologic: no nystagmus, good sitting balance. Cranial nerves II through XII intact, motor strength is 5/5 in bilateral deltoid, bicep, tricep, grip, hip flexor, knee extensors, ankle dorsiflexor and plantar flexor Sensory exam normal sensation to light touch and proprioception in bilateral upper and lower extremities. Decreased [place] RUE Skin: intact.  Musculoskeletal: Full range of motion in all 4 extremities. No joint swelling   Assessment/Plan: 1. Functional deficits secondary to Left MCA infarct  which require 3+ hours per day of interdisciplinary therapy in a  comprehensive inpatient rehab setting.  Physiatrist is providing close team supervision and 24 hour management of active medical problems listed below.  Physiatrist and rehab team continue to assess barriers to discharge/monitor patient progress toward functional and medical goals  Care Tool:  Bathing    Body parts bathed by patient: Right arm, Left arm, Chest, Abdomen, Front perineal area, Right upper leg, Left upper leg, Face, Buttocks, Right lower leg, Left lower leg   Body parts bathed by helper: Buttocks, Right lower leg, Left lower leg     Bathing assist Assist Level: Contact Guard/Touching assist     Upper Body Dressing/Undressing Upper body dressing   What is the patient wearing?: Pull over shirt    Upper body assist Assist Level: Set up assist    Lower Body Dressing/Undressing Lower body dressing      What is the patient wearing?: Underwear/pull up, Pants     Lower body assist Assist for lower body dressing: Contact Guard/Touching assist     Toileting Toileting    Toileting assist Assist for toileting: Contact Guard/Touching assist Assistive Device Comment: urinal   Transfers Chair/bed transfer  Transfers assist     Chair/bed transfer assist level: Supervision/Verbal cueing Chair/bed transfer assistive device: Programmer, multimedia   Ambulation assist      Assist level: Supervision/Verbal cueing Assistive device: No Device Max distance: 160   Walk 10 feet activity   Assist     Assist level: Supervision/Verbal cueing Assistive device: No Device   Walk 50 feet activity   Assist    Assist  level: Supervision/Verbal cueing Assistive device: No Device    Walk 150 feet activity   Assist    Assist level: Supervision/Verbal cueing Assistive device: No Device    Walk 10 feet on uneven surface  activity   Assist     Assist level: Minimal Assistance - Patient > 75% Assistive device: Other (comment) (no UE support)    Wheelchair     Assist Will patient use wheelchair at discharge?: No   Wheelchair activity did not occur: N/A         Wheelchair 50 feet with 2 turns activity    Assist    Wheelchair 50 feet with 2 turns activity did not occur: N/A       Wheelchair 150 feet activity     Assist  Wheelchair 150 feet activity did not occur: N/A       Blood pressure 128/67, pulse 74, temperature 98.1 F (36.7 C), temperature source Oral, resp. rate 17, height 5\' 10"  (1.778 m), weight (!) 137 kg, SpO2 100 %.    Medical Problem List and Plan: 1.  Dizziness, DOE, balance deficits secondary to left MCA infarct.             -patient may shower             -ELOS/Goals: 7-10 days/mod I/supervision             PT, OT,SLP CIR level  2.  Antithrombotics: -DVT/anticoagulation:  Pharmaceutical: Heparin             -antiplatelet therapy: DAPT X 3 weeks 3. Pain Management: Neurontin bid for neuropathy. Tylenol prn.  4. Mood: LCSW to follow for evaluation and support.              -antipsychotic agents: N/A  5. Neuropsych: This patient is capable of making decisions on his own behalf. 6. Skin/Wound Care: routine pressure relief measures.  7. Fluids/Electrolytes/Nutrition: Monitor I/O.  CMP ordered for tomorrow a.m. 8. T2DM with neuropathy: Monitor BS ac/hs. Was on 70/30 insulin --30 units daily. Will resume at 5 units in am and titrate upwards.              Monitor with increased mobility CBG (last 3)  Recent Labs    10/10/19 1647 10/10/19 2056 10/11/19 0609  GLUCAP 127* 174* 97  controlled 6/11-13  9. CKD III: BUN/SCr- 31/1.87 at admission-->24/1.47.   - Acute exacerbation now 36 and 2.08-->32/1.5 on 6/12   -suspect this was responsible for dizziness he described  6/13 BUN/Cr have improved to 28/1.46   -IVF given x 1L   -pt states dizziness is better            10. HTN: Monitor for orthostatic changes--reports of dizziness with activity. Permissive hypertension--on Coreg,  cozaar             Vitals:   10/11/19 0429 10/11/19 0748  BP: 124/60 128/67  Pulse: 71 74  Resp: 17   Temp: 98.1 F (36.7 C)   SpO2: 100%   controlled 6/13, cardiology note states will stop lisinopril but he's not on this. May have meant cozaar? Will check with cards to verify 11. NSVT: Asymptomatic. Back on Coreg and to follow up on outpatient basis.              Monitor heart rate with increased exertion  -appreciate Cards f/u,  12. Morbid obesity-BMI 44.7:  Educate on appropriate diet and importance of weight loss to help promote mobility and helth.  13.. H/o  gout: Resume allopurinol  14. Dyslipidemia: Now on high dose statin.  15.  Anemia of chronic disease?:  CBC ordered  LOS: 4 days A FACE TO FACE EVALUATION WAS PERFORMED  Meredith Staggers 10/11/2019, 10:28 AM

## 2019-10-12 ENCOUNTER — Inpatient Hospital Stay (HOSPITAL_COMMUNITY): Payer: Medicare PPO | Admitting: Physical Therapy

## 2019-10-12 ENCOUNTER — Inpatient Hospital Stay (HOSPITAL_COMMUNITY): Payer: Medicare PPO

## 2019-10-12 ENCOUNTER — Inpatient Hospital Stay (HOSPITAL_COMMUNITY): Payer: Medicare PPO | Admitting: Occupational Therapy

## 2019-10-12 LAB — GLUCOSE, CAPILLARY
Glucose-Capillary: 96 mg/dL (ref 70–99)
Glucose-Capillary: 139 mg/dL — ABNORMAL HIGH (ref 70–99)
Glucose-Capillary: 114 mg/dL — ABNORMAL HIGH (ref 70–99)
Glucose-Capillary: 142 mg/dL — ABNORMAL HIGH (ref 70–99)

## 2019-10-12 LAB — CBC
HCT: 32 % — ABNORMAL LOW (ref 39.0–52.0)
Hemoglobin: 10.2 g/dL — ABNORMAL LOW (ref 13.0–17.0)
MCH: 30.4 pg (ref 26.0–34.0)
MCHC: 31.9 g/dL (ref 30.0–36.0)
MCV: 95.2 fL (ref 80.0–100.0)
Platelets: 174 10*3/uL (ref 150–400)
RBC: 3.36 MIL/uL — ABNORMAL LOW (ref 4.22–5.81)
RDW: 13 % (ref 11.5–15.5)
WBC: 4.9 10*3/uL (ref 4.0–10.5)
nRBC: 0 % (ref 0.0–0.2)

## 2019-10-12 LAB — BASIC METABOLIC PANEL
Anion gap: 7 (ref 5–15)
BUN: 24 mg/dL — ABNORMAL HIGH (ref 8–23)
CO2: 24 mmol/L (ref 22–32)
Calcium: 9 mg/dL (ref 8.9–10.3)
Chloride: 109 mmol/L (ref 98–111)
Creatinine, Ser: 1.43 mg/dL — ABNORMAL HIGH (ref 0.61–1.24)
GFR calc Af Amer: 53 mL/min — ABNORMAL LOW (ref >60)
GFR calc non Af Amer: 46 mL/min — ABNORMAL LOW (ref >60)
Glucose, Bld: 109 mg/dL — ABNORMAL HIGH (ref 70–99)
Potassium: 4.2 mmol/L (ref 3.5–5.1)
Sodium: 140 mmol/L (ref 135–145)

## 2019-10-12 MED ORDER — CARVEDILOL 25 MG PO TABS
25.0000 mg | ORAL_TABLET | Freq: Two times a day (BID) | ORAL | Status: DC
  Administered 2019-10-12 – 2019-10-14 (×4): 25 mg via ORAL
  Filled 2019-10-12 (×4): qty 1

## 2019-10-12 NOTE — Plan of Care (Signed)
°  Problem: Consults Goal: RH STROKE PATIENT EDUCATION Description: See Patient Education module for education specifics  Outcome: Progressing Goal: Diabetes Guidelines if Diabetic/Glucose > 140 Description: If diabetic or lab glucose is > 140 mg/dl - Initiate Diabetes/Hyperglycemia Guidelines & Document Interventions  Outcome: Progressing   Problem: RH SKIN INTEGRITY Goal: RH STG SKIN FREE OF INFECTION/BREAKDOWN Description: Skin will remain free of infection and breakdown with Mod I assist.  Outcome: Progressing Goal: RH STG MAINTAIN SKIN INTEGRITY WITH ASSISTANCE Description: STG Maintain Skin Integrity With mod I Assistance. Outcome: Progressing   Problem: RH SAFETY Goal: RH STG ADHERE TO SAFETY PRECAUTIONS W/ASSISTANCE/DEVICE Description: STG Adhere to Safety Precautions With cues and reminders Outcome: Progressing   Problem: RH PAIN MANAGEMENT Goal: RH STG PAIN MANAGED AT OR BELOW PT'S PAIN GOAL Description: Pt. Will have pain level managed at a level 4 or less out of 10. Outcome: Progressing   Problem: RH KNOWLEDGE DEFICIT Goal: RH STG INCREASE KNOWLEDGE OF DIABETES Description: Pt. Will be able to adhere to medication regime and dietary and lifestyle modifications to control blood sugar levels with Mod I assist. Outcome: Progressing Goal: RH STG INCREASE KNOWLEDGE OF HYPERTENSION Description: Pt. Will be able to adhere to medication regime and dietary and lifestyle modifications to control hypertension with Mod I assist. Pt. Will be knowledgeable about normal blood pressure ranges. Outcome: Progressing Goal: RH STG INCREASE KNOWLEGDE OF HYPERLIPIDEMIA Description: Pt. Will be able to adhere to medication regime and dietary and lifestyle modifications to control hyperlipidemia with Mod I assist. Pt can identify medications to assist with hyperlipidemia with Mod I assistance. Outcome: Progressing Goal: RH STG INCREASE KNOWLEDGE OF STROKE PROPHYLAXIS Description: Pt. Will be  able to identify medication regimen and dietary and lifestyle modifications to prevent  future stroke with Mod I assist. Patient can identify and verbalize signs and symptoms of stroke with mod I assist. Outcome: Progressing

## 2019-10-12 NOTE — Progress Notes (Signed)
Occupational Therapy Session Note  Patient Details  Name: Nathan Proctor. MRN: [id number] Date of Birth: [birth year]  Today's Date: 10/12/2019 OT Individual Time: 1440-1530 OT Individual Time Calculation (min): 50 min    Short Term Goals: Week 1:  OT Short Term Goal 1 (Week 1): STGs=LTGs secondary to estimated short LOS  Skilled Therapeutic Interventions/Progress Updates:    Pt received sitting up in the recliner with no c/o pain. Requesting to take shower. Pt completed item retrieval around room, gathering items for shower at (S) level without the use of any AD. Pt required 1 cue for completing LB dressing seated instead of standing. Pt transferred onto TTB in walk in shower. All bathing completed at mod I level seated. Pt able to don underwear and pants with (S), requiring several attempts to loop around feet but no assistance required. Pt donned shirt mod I. Pt able to don socks with mod I. Pt completed 150 ft of functional mobility progressing to mod I. Pt completed [facility] activity with his RUE- completing palm to finger translation with min cueing. Pt was left sitting up in the recliner with all needs met.   Therapy Documentation Precautions:  Precautions Precautions: Fall Restrictions Weight Bearing Restrictions: No   Therapy/Group: Individual Therapy  Curtis Sites 10/12/2019, 3:30 PM

## 2019-10-12 NOTE — Progress Notes (Signed)
Physical Therapy Session Note  Patient Details  Name: Nathan Proctor. MRN: [id number] Date of Birth: October 25, 1940  Today's Date: 10/12/2019 PT Individual Time: 0917-1015 PT Individual Time Calculation (min): 58 min   Short Term Goals: Week 1:  PT Short Term Goal 1 (Week 1): = to LTGs based on ELOS  Skilled Therapeutic Interventions/Progress Updates: Pt presented in recliner agreeable to therapy. Pt denies pain but states some "lightheadedness" which per pt has been constant. Pt ambulated to rehab gym with supervision. Pt participated in obstacle course in hallway including weaving through cones, stepping over thresholds, and walking on compliant surface. Pt able to perform all with CGA fading to close S. Pt then ascended/descended x 4 step with B rails, supervision and step through pattern. Pt indicated that he has only 1 step without rails but otherwise has no steps into house. Pt then participated in seated and standing LE therex for general strengthening and conditioning. Performed standing hip abd/add, standing march, and hamstring curls 2 x 10 bilaterally. Pt also performed LAQ 2 x 10. Pt required intermittent rests dur to fatigue but was able to perform all with good technique.      Therapy Documentation Precautions:  Precautions Precautions: Fall Restrictions Weight Bearing Restrictions: No General:   Vital Signs: Therapy Vitals Temp: 98.3 F (36.8 C) Temp Source: Oral Pulse Rate: 70 Resp: 14 BP: (!) 141/77 Patient Position (if appropriate): Sitting Oxygen Therapy SpO2: 98 % O2 Device: Room Air Pain: Pain Assessment Pain Scale: 0-10 Pain Score: 0-No pain Mobility:   Locomotion :    Trunk/Postural Assessment :    Balance:   Exercises:   Other Treatments:      Therapy/Group: Individual Therapy  Sahand Gosch 10/12/2019, 4:20 PM

## 2019-10-12 NOTE — Progress Notes (Signed)
[place] PHYSICAL MEDICINE & REHABILITATION PROGRESS NOTE   Subjective/Complaints:  Appreciate cardiology note, reviewed BMET  ROS: Patient denies CP, SOB, N/V/D   No results found. Recent Labs    10/12/19 0708  WBC 4.9  HGB 10.2*  HCT 32.0*  PLT 174   Recent Labs    10/11/19 0538 10/12/19 0708  NA 137 140  K 4.0 4.2  CL 105 109  CO2 23 24  GLUCOSE 108* 109*  BUN 28* 24*  CREATININE 1.46* 1.43*  CALCIUM 8.8* 9.0    Intake/Output Summary (Last 24 hours) at 10/12/2019 0935 Last data filed at 10/12/2019 0700 Gross per 24 hour  Intake 400 ml  Output 925 ml  Net -525 ml     Physical Exam: Vital Signs Blood pressure (!) 149/77, pulse 76, temperature 98.2 F (36.8 C), temperature source Oral, resp. rate 16, height 5\' 10"  (1.778 m), weight (!) 137 kg, SpO2 97 %.    Constitutional: No distress . Vital signs reviewed. HEENT: EOMI, oral membranes moist Neck: supple Cardiovascular: RRR without murmur. No JVD    Respiratory/Chest: CTA Bilaterally without wheezes or rales. Normal effort    GI/Abdomen: BS +, non-tender, non-distended Ext: no clubbing, cyanosis, or edema Psych: pleasant and cooperative Skin: No evidence of breakdown, no evidence of rash Neurologic: no nystagmus, good sitting balance. Cranial nerves II through XII intact, motor strength is 5/5 in bilateral deltoid, bicep, tricep, grip, hip flexor, knee extensors, ankle dorsiflexor and plantar flexor Sensory exam normal sensation to light touch and proprioception in bilateral upper and lower extremities. Decreased [place] RUE Skin: intact.  Musculoskeletal: Full range of motion in all 4 extremities. No joint swelling   Assessment/Plan: 1. Functional deficits secondary to Left MCA infarct  which require 3+ hours per day of interdisciplinary therapy in a comprehensive inpatient rehab setting.  Physiatrist is providing close team supervision and 24 hour management of active medical problems listed  below.  Physiatrist and rehab team continue to assess barriers to discharge/monitor patient progress toward functional and medical goals  Care Tool:  Bathing    Body parts bathed by patient: Right arm, Left arm, Chest, Abdomen, Front perineal area, Right upper leg, Left upper leg, Face, Buttocks, Right lower leg, Left lower leg   Body parts bathed by helper: Buttocks, Right lower leg, Left lower leg     Bathing assist Assist Level: Contact Guard/Touching assist     Upper Body Dressing/Undressing Upper body dressing   What is the patient wearing?: Pull over shirt    Upper body assist Assist Level: Independent with assistive device    Lower Body Dressing/Undressing Lower body dressing      What is the patient wearing?: Underwear/pull up, Pants     Lower body assist Assist for lower body dressing: Independent with assitive device     Toileting Toileting    Toileting assist Assist for toileting: Set up assist Assistive Device Comment:  (rolling walker)   Transfers Chair/bed transfer  Transfers assist     Chair/bed transfer assist level: Supervision/Verbal cueing Chair/bed transfer assistive device: Programmer, multimedia   Ambulation assist      Assist level: Supervision/Verbal cueing Assistive device: No Device Max distance: 160   Walk 10 feet activity   Assist     Assist level: Supervision/Verbal cueing Assistive device: No Device   Walk 50 feet activity   Assist    Assist level: Supervision/Verbal cueing Assistive device: No Device    Walk 150 feet activity  Assist    Assist level: Supervision/Verbal cueing Assistive device: No Device    Walk 10 feet on uneven surface  activity   Assist     Assist level: Minimal Assistance - Patient > 75% Assistive device: Other (comment) (no UE support)   Wheelchair     Assist Will patient use wheelchair at discharge?: No   Wheelchair activity did not occur: N/A          Wheelchair 50 feet with 2 turns activity    Assist    Wheelchair 50 feet with 2 turns activity did not occur: N/A       Wheelchair 150 feet activity     Assist  Wheelchair 150 feet activity did not occur: N/A       Blood pressure (!) 149/77, pulse 76, temperature 98.2 F (36.8 C), temperature source Oral, resp. rate 16, height 5\' 10"  (1.778 m), weight (!) 137 kg, SpO2 97 %.    Medical Problem List and Plan: 1.  Dizziness, DOE, balance deficits secondary to left MCA infarct.             -patient may shower             -ELOS/Goals: Should be ready Wed 6/16             PT, OT,SLP CIR level  2.  Antithrombotics: -DVT/anticoagulation:  Pharmaceutical: Heparin             -antiplatelet therapy: DAPT X 3 weeks 3. Pain Management: Neurontin bid for neuropathy. Tylenol prn.  4. Mood: LCSW to follow for evaluation and support.              -antipsychotic agents: N/A  5. Neuropsych: This patient is capable of making decisions on his own behalf. 6. Skin/Wound Care: routine pressure relief measures.  7. Fluids/Electrolytes/Nutrition: Monitor I/O.  CMP Improved creat will d/c IV  8. T2DM with neuropathy: Monitor BS ac/hs. Was on 70/30 insulin --30 units daily. Will resume at 5 units in am and titrate upwards.              Monitor with increased mobility CBG (last 3)  Recent Labs    10/11/19 1723 10/11/19 2107 10/12/19 0557  GLUCAP 137* 198* 96  controlled 6/14  9. CKD III: BUN/SCr- 31/1.87 at admission-->24/1.47.   - Acute exacerbation now 36 and 2.08-->32/1.5 on 6/12   -suspect this was responsible for dizziness he described  6/13 BUN/Cr have improved to 28/1.46   -IVF given x 1L   -pt states dizziness is better            10. HTN: Monitor for orthostatic changes--reports of dizziness with activity. Permissive hypertension--on Coreg, cozaar             Vitals:   10/12/19 0555 10/12/19 0556  BP: (!) 149/77 (!) 149/77  Pulse: 81 76  Resp: 19 16  Temp: 98.2 F  (36.8 C) 98.2 F (36.8 C)  SpO2: 98% 97%  controlled 6/14, cardiology note states will stop lisinopril but he's not on this. Is on Cozaar will stop and bump Carvedilol to 25mg  BID home dose    11. NSVT: Asymptomatic. Back on Coreg and to follow up on outpatient basis.              Monitor heart rate with increased exertion  -appreciate Cards f/u,  12. Morbid obesity-BMI 44.7:  Educate on appropriate diet and importance of weight loss to help promote mobility and helth.  13.. H/o  gout: Resume allopurinol  14. Dyslipidemia: Now on high dose statin.  15.  Anemia of chronic disease?:  CBC ordered  LOS: 5 days A FACE TO FACE EVALUATION WAS PERFORMED  Charlett Blake 10/12/2019, 9:35 AM

## 2019-10-12 NOTE — Progress Notes (Signed)
Occupational Therapy Session Note  Patient Details  Name: Nathan Proctor. MRN: [id number] Date of Birth: [birth year]  Today's Date: 10/12/2019 OT Individual Time: 6063-0160 OT Individual Time Calculation (min): 42 min  Short Term Goals: Week 1:  OT Short Term Goal 1 (Week 1): STGs=LTGs secondary to estimated short LOS  Skilled Therapeutic Interventions/Progress Updates:    Pt greeted in bed with no c/o pain. Agreeable to start session by getting dressed. Supine<sit completed unassisted and then pt used his RW to gather new clothing for the day, ambulating at Mod I level. He got dressed sit<stand from EOB using RW for standing support, also completed perihygiene with setup assist. Pt ate his breakfast while EOB and we discussed options of HHOT vs OPOT for f/u. Pt reported that he would like HHOT because his spouse takes care of their adult son and he doesn't want to put more stress on spouse. Oral care and handwashing then completed while standing at sink using RW. OT provided him with a walker bag and we discussed functional use at home and also how to use bag when setting himself up for shower later in the day during last OT session. At end of session pt remained sitting in the recliner with all needs within reach and chair alarm set. Tx focus placed on d/c planning, ADL retraining, dynamic balance, and activity tolerance.   Therapy Documentation Precautions:  Precautions Precautions: Fall Restrictions Weight Bearing Restrictions: No ADL:       Therapy/Group: Individual Therapy  Emersynn Deatley A Miranda Frese 10/12/2019, 12:16 PM

## 2019-10-12 NOTE — Progress Notes (Signed)
Occupational Therapy Session Note  Patient Details  Name: Nathan Proctor. MRN: [id number] Date of Birth: 05-21-1940  Today's Date: 10/12/2019 OT Individual Time: 1130-1156 OT Individual Time Calculation (min): 26 min    Short Term Goals: Week 1:  OT Short Term Goal 1 (Week 1): STGs=LTGs secondary to estimated short LOS  Skilled Therapeutic Interventions/Progress Updates:    Pt greeted at time of session sitting up in recliner, no c/o pain. Pt did say he is eager to go home with wife, wants HHOT. Collaborated with the patient and problem solved his home set up for shower given his jacuzzi tub that has a seat build in to the tub and a sliding door so a TTB/shower seat will not fit. Education on safe transfer techniques and decreasing risks of falls when he does go home. BUE strengthening w/ 4# dowel for bicep curl, chest press, overhead press with 1 rest break in between sets. Pt ed on continuing UB strengthening program at home with his small dumbbells to improve endurance and UB strength. Pt up in chair with all needs met, call bell in reach, alarm on.   Therapy Documentation Precautions:  Precautions Precautions: Fall Restrictions Weight Bearing Restrictions: No     Therapy/Group: Individual Therapy  Viona Gilmore 10/12/2019, 1:02 PM

## 2019-10-13 ENCOUNTER — Inpatient Hospital Stay (HOSPITAL_COMMUNITY): Payer: Medicare PPO | Admitting: Occupational Therapy

## 2019-10-13 ENCOUNTER — Inpatient Hospital Stay (HOSPITAL_COMMUNITY): Payer: Medicare PPO | Admitting: Physical Therapy

## 2019-10-13 LAB — GLUCOSE, CAPILLARY
Glucose-Capillary: 103 mg/dL — ABNORMAL HIGH (ref 70–99)
Glucose-Capillary: 118 mg/dL — ABNORMAL HIGH (ref 70–99)
Glucose-Capillary: 130 mg/dL — ABNORMAL HIGH (ref 70–99)

## 2019-10-13 NOTE — Progress Notes (Signed)
Physical Therapy Discharge Summary  Patient Details  Name: Nathan Proctor. MRN: [id number] Date of Birth: 1940/11/09  Today's Date: 10/13/2019    Patient has met 9 of 9 long term goals due to improved activity tolerance, improved balance, improved postural control, increased strength, improved attention, improved awareness and improved coordination.  Patient to discharge at an ambulatory level Modified Independent.   Patient's care partner is independent to provide the necessary physical assistance at discharge.  Reasons goals not met: N/A  Recommendation:  Patient will benefit from ongoing skilled PT services in home health setting to continue to advance safe functional mobility, address ongoing impairments in balance, strength, gait, safety, and minimize fall risk.  Equipment: No equipment provided  Reasons for discharge: treatment goals met  Patient/family agrees with progress made and goals achieved: Yes  PT Discharge Precautions/Restrictions Precautions Precautions: Fall Restrictions Weight Bearing Restrictions: No Vital Signs Therapy Vitals Temp: 98.6 F (37 C) Temp Source: Oral Pulse Rate: 75 Resp: 16 BP: 130/74 Patient Position (if appropriate): Lying Oxygen Therapy SpO2: 96 % O2 Device: Room Air Pain Pain Assessment Pain Scale: 0-10 Pain Score: 0-No pain  Cognition Overall Cognitive Status: Within Functional Limits for tasks assessed Arousal/Alertness: Awake/alert Sensation Sensation Light Touch: Appears Intact Hot/Cold: Appears Intact Motor  Motor Motor: Within Functional Limits Motor - Discharge Observations: improved R hemiplegia  Mobility Bed Mobility Bed Mobility: Supine to Sit;Sit to Supine Supine to Sit: Independent Sit to Supine: Independent Transfers Transfers: Sit to Stand;Stand to Sit;Stand Pivot Transfers Sit to Stand: Independent with assistive device Stand to Sit: Independent with assistive device Stand Pivot Transfers:  Independent Transfer (Assistive device): None Locomotion  Gait Ambulation: Yes Gait Assistance: Independent Gait Distance (Feet): 150 Feet Assistive device: None Gait Gait: Yes Gait Pattern: Impaired Gait Pattern: Wide base of support;Lateral trunk lean to left;Lateral trunk lean to right (most likely due to body habitus) Gait velocity: decreased Stairs / Additional Locomotion Stairs: Yes Stairs Assistance: Independent with assistive device Stair Management Technique: Two rails Number of Stairs: 12 Height of Stairs: 6 Ramp: Independent with assistive device (with rail) Curb: Independent with assistive device Wheelchair Mobility Wheelchair Mobility: No  Trunk/Postural Assessment  Cervical Assessment Cervical Assessment: Exceptions to [facility] (forward head) Thoracic Assessment Thoracic Assessment: Exceptions to [facility] (rounded shoulders) Lumbar Assessment Lumbar Assessment: Exceptions to [facility] (posterior pelvis tilt) Postural Control Postural Control: Deficits on evaluation  Balance Balance Balance Assessed: Yes Dynamic Sitting Balance Dynamic Sitting - Balance Support: Feet supported Dynamic Sitting - Level of Assistance: 7: Independent Dynamic Standing Balance Dynamic Standing - Balance Support: During functional activity Dynamic Standing - Level of Assistance: 6: Modified independent (Device/Increase time) Dynamic Standing - Balance Activities: Reaching across midline;Reaching for objects;Ball toss Dynamic Standing - Comments: ball toss to rebounder Extremity Assessment  RUE Assessment RUE Assessment: Within Functional Limits LUE Assessment LUE Assessment: Within Functional Limits RLE Assessment RLE Assessment: Exceptions to [facility] Active Range of Motion (AROM) Comments: [facility] General Strength Comments: grossly 4/5 LLE Assessment LLE Assessment: Within Functional Limits    Rosita DeChalus 10/13/2019, 4:19 PM     Barrie Folk PT, DPT  10/14/19 10:05 AM

## 2019-10-13 NOTE — Progress Notes (Signed)
Physical Therapy Session Note  Patient Details  Name: Nathan Proctor. MRN: [id number] Date of Birth: August 21, 1940  Today's Date: 10/13/2019 PT Individual Time: 1000-1100 PT Individual Time Calculation (min): 60 min   Short Term Goals: Week 1:  PT Short Term Goal 1 (Week 1): = to LTGs based on ELOS  Skilled Therapeutic Interventions/Progress Updates: Pt presented in recliner agreeable to therapy. Pt denies pain and ready for d/c. Session focused on functional activities in preparation for d/c. Pt transported to ortho gym via w/c for energy conservation. Participated in car transfer, gait on ramp, uneven surface (mulch) without AD and at independent level. Pt then transported to ADL apt and participated in bed mobility at independent level. Pt then transported to rehab gym and discussed stairs per home set up. Per pt x 1 landing then x 2 small steps to enter home. Pt ascended/descended x 8 steps (3in) without AD however required supervision due to mild unsteadiness. Discussed use of HurryCane with stairs which pt performed with [facility] and demonstrated significant improvement with stairs. Pt verbalized agreement and understanding with use of HurryCane when performing home entry. Pt then ascended/descended x 12 steps (6in) with B rails and mod I. Pt also participated in dynamic balance including use of rebounder forward and with lateral turns. Pt then ambulated back to room at end of session independently and left sitting EOB (pt mod I in room) with call bell within reach and needs met.      Therapy Documentation Precautions:  Precautions Precautions: Fall Restrictions Weight Bearing Restrictions: No General:   Vital Signs: Therapy Vitals Temp: 98.6 F (37 C) Temp Source: Oral Pulse Rate: 75 Resp: 16 BP: 130/74 Patient Position (if appropriate): Lying Oxygen Therapy SpO2: 96 % O2 Device: Room Air   Therapy/Group: Individual Therapy  Dwane Andres  Phila Shoaf,  PTA  10/13/2019, 4:03 PM

## 2019-10-13 NOTE — Plan of Care (Signed)
°  Problem: RH SAFETY Goal: RH STG ADHERE TO SAFETY PRECAUTIONS W/ASSISTANCE/DEVICE Description: STG Adhere to Safety Precautions With cues and reminders Outcome: Progressing   Problem: RH KNOWLEDGE DEFICIT Goal: RH STG INCREASE KNOWLEDGE OF DIABETES Description: Pt. Will be able to adhere to medication regime and dietary and lifestyle modifications to control blood sugar levels with Mod I assist. Outcome: Progressing Goal: RH STG INCREASE KNOWLEDGE OF HYPERTENSION Description: Pt. Will be able to adhere to medication regime and dietary and lifestyle modifications to control hypertension with Mod I assist. Pt. Will be knowledgeable about normal blood pressure ranges. Outcome: Progressing Goal: RH STG INCREASE KNOWLEGDE OF HYPERLIPIDEMIA Description: Pt. Will be able to adhere to medication regime and dietary and lifestyle modifications to control hyperlipidemia with Mod I assist. Pt can identify medications to assist with hyperlipidemia with Mod I assistance. Outcome: Progressing Goal: RH STG INCREASE KNOWLEDGE OF STROKE PROPHYLAXIS Description: Pt. Will be able to identify medication regimen and dietary and lifestyle modifications to prevent  future stroke with Mod I assist. Patient can identify and verbalize signs and symptoms of stroke with mod I assist. Outcome: Progressing

## 2019-10-13 NOTE — Progress Notes (Signed)
Occupational Therapy Discharge Summary  Patient Details  Name: Nathan Proctor. MRN: [id number] Date of Birth: 06-29-1940  Today's Date: 10/13/2019 OT Individual Time: 4098-1191 and 1300-1411 OT Individual Time Calculation (min): 55 min and 71 min   Patient has met 10 of 10 long term goals due to improved activity tolerance, improved balance, postural control, ability to compensate for deficits, functional use of  RIGHT upper and RIGHT lower extremity and improved coordination.  Patient to discharge at overall Modified Independent level.    Reasons goals not met: all goals met  Recommendation:  Patient will benefit from ongoing skilled OT services in home health setting to continue to advance functional skills in the area of BADL and iADL.  Equipment: No equipment provided  Reasons for discharge: treatment goals met  Patient/family agrees with progress made and goals achieved: Yes   OT Intervention: Session 1: Upon entering the room, pt supine in bed on phone with no c/o pain. Pt is agreeable to OT intervention. OT reviewing long term goals with pt. Pt demonstrated ability to ambulate without use of AD in room to obtain needed clothing and items for shower at mod I level for increased time. Pt bathing while seated on TTB in walk in shower at mod I level and then ambulating to sit on EOB and don clothing items with mod I overall. OT making pt mod I in room and reviewing he must stay in room for safety but could still call staff if needed. Pt seated on EOB and eating breakfast with call bell and all needed items within reach.   Session 2: Upon entering the room, pt seated on EOB awaiting OT arrival. OT reviewing theraputty HEP with pt needing min cuing for proper exercise. OT also providing pt with HEP for B UE strengthening with use of level 3 resistive theraband. OT demonstrating exercises and pt returning demonstrations with min cuing for 3 sets of 10 chest pulls, shoulder diagonals,  shoulder elevation, bicep curls, and alternating punches with rest breaks between each set as needed. Pt also on phone with wife during this session and OT provided education on discharge time and expectations for tomorrow. No further questions at this time. Call bell and all needed items within reach.   OT Discharge Precautions/Restrictions  Precautions Precautions: Fall Vital Signs Therapy Vitals Temp: 98.6 F (37 C) Pulse Rate: 76 Resp: 16 BP: (!) 135/56 Patient Position (if appropriate): Lying Oxygen Therapy SpO2: 97 % O2 Device: Room Air Pain Pain Assessment Pain Scale: 0-10 Pain Score: 0-No pain Vision Baseline Vision/History: No visual deficits Cognition Overall Cognitive Status: Within Functional Limits for tasks assessed Arousal/Alertness: Awake/alert Orientation Level: Oriented X4 Sensation Sensation Light Touch: Appears Intact Hot/Cold: Appears Intact Proprioception: Appears Intact Coordination Gross Motor Movements are Fluid and Coordinated: Yes Fine Motor Movements are Fluid and Coordinated: Yes Motor  Motor Motor - Discharge Observations: mild R hemiparesis improved since initial evaluation Mobility  Bed Mobility Bed Mobility: Supine to Sit;Sit to Supine Supine to Sit: Independent Sit to Supine: Independent Transfers Sit to Stand: Independent with assistive device Stand to Sit: Independent with assistive device  Trunk/Postural Assessment  Cervical Assessment Cervical Assessment: Exceptions to [facility] (forward head) Thoracic Assessment Thoracic Assessment: Exceptions to [facility] (kyphotic) Lumbar Assessment Lumbar Assessment: Exceptions to [facility] (posterior pelvic tilt)  Balance Balance Balance Assessed: Yes Dynamic Sitting Balance Dynamic Sitting - Balance Support: Feet supported Dynamic Sitting - Level of Assistance: 7: Independent Dynamic Standing Balance Dynamic Standing - Balance Support: During functional  activity Dynamic Standing - Level of  Assistance: 6: Modified independent (Device/Increase time) Extremity/Trunk Assessment RUE Assessment RUE Assessment: Within Functional Limits LUE Assessment LUE Assessment: Within Functional Limits   Gypsy Decant 10/13/2019, 7:24 AM

## 2019-10-13 NOTE — Progress Notes (Signed)
[place] PHYSICAL MEDICINE & REHABILITATION PROGRESS NOTE   Subjective/Complaints:  No issues overnite discussed d/c  ROS: Patient denies CP, SOB, N/V/D   No results found. Recent Labs    10/12/19 0708  WBC 4.9  HGB 10.2*  HCT 32.0*  PLT 174   Recent Labs    10/11/19 0538 10/12/19 0708  NA 137 140  K 4.0 4.2  CL 105 109  CO2 23 24  GLUCOSE 108* 109*  BUN 28* 24*  CREATININE 1.46* 1.43*  CALCIUM 8.8* 9.0    Intake/Output Summary (Last 24 hours) at 10/13/2019 0848 Last data filed at 10/13/2019 0436 Gross per 24 hour  Intake 360 ml  Output 1450 ml  Net -1090 ml     Physical Exam: Vital Signs Blood pressure (!) 135/56, pulse 76, temperature 98.6 F (37 C), resp. rate 16, height 5\' 10"  (1.778 m), weight (!) 137 kg, SpO2 97 %.    Constitutional: No distress . Vital signs reviewed. HEENT: EOMI, oral membranes moist Neck: supple Cardiovascular: RRR without murmur. No JVD    Respiratory/Chest: CTA Bilaterally without wheezes or rales. Normal effort    GI/Abdomen: BS +, non-tender, non-distended Ext: no clubbing, cyanosis, or edema Psych: pleasant and cooperative Skin: No evidence of breakdown, no evidence of rash Neurologic: no nystagmus, good sitting balance. Cranial nerves II through XII intact, motor strength is 5/5 in bilateral deltoid, bicep, tricep, grip, hip flexor, knee extensors, ankle dorsiflexor and plantar flexor Sensory exam normal sensation to light touch and proprioception in bilateral upper and lower extremities. Decreased [place] RUE Skin: intact.  Musculoskeletal: Full range of motion in all 4 extremities. No joint swelling   Assessment/Plan: 1. Functional deficits secondary to Left MCA infarct  which require 3+ hours per day of interdisciplinary therapy in a comprehensive inpatient rehab setting.  Physiatrist is providing close team supervision and 24 hour management of active medical problems listed below.  Physiatrist and rehab team continue  to assess barriers to discharge/monitor patient progress toward functional and medical goals  Care Tool:  Bathing    Body parts bathed by patient: Right arm, Left arm, Chest, Abdomen, Front perineal area, Right upper leg, Left upper leg, Face, Buttocks, Right lower leg, Left lower leg   Body parts bathed by helper: Buttocks, Right lower leg, Left lower leg     Bathing assist Assist Level: Independent with assistive device Assistive Device Comment: TTB, seated   Upper Body Dressing/Undressing Upper body dressing   What is the patient wearing?: Pull over shirt    Upper body assist Assist Level: Independent with assistive device    Lower Body Dressing/Undressing Lower body dressing      What is the patient wearing?: Underwear/pull up, Pants     Lower body assist Assist for lower body dressing: Independent with assitive device     Toileting Toileting    Toileting assist Assist for toileting: Independent with assistive device Assistive Device Comment:  (rolling walker)   Transfers Chair/bed transfer  Transfers assist     Chair/bed transfer assist level: Independent Chair/bed transfer assistive device: Museum/gallery exhibitions officer assist      Assist level: Supervision/Verbal cueing Assistive device: No Device Max distance: 127ft   Walk 10 feet activity   Assist     Assist level: Supervision/Verbal cueing Assistive device: No Device   Walk 50 feet activity   Assist    Assist level: Supervision/Verbal cueing Assistive device: No Device    Walk 150 feet activity  Assist Walk 150 feet activity did not occur: Safety/medical concerns (max distance 141 feet)  Assist level: Supervision/Verbal cueing Assistive device: No Device    Walk 10 feet on uneven surface  activity   Assist     Assist level: Contact Guard/Touching assist Assistive device: Other (comment) (no UE support)   Wheelchair     Assist Will patient use  wheelchair at discharge?: No   Wheelchair activity did not occur: N/A         Wheelchair 50 feet with 2 turns activity    Assist    Wheelchair 50 feet with 2 turns activity did not occur: N/A       Wheelchair 150 feet activity     Assist  Wheelchair 150 feet activity did not occur: N/A       Blood pressure (!) 135/56, pulse 76, temperature 98.6 F (37 C), resp. rate 16, height 5\' 10"  (1.778 m), weight (!) 137 kg, SpO2 97 %.    Medical Problem List and Plan: 1.  Dizziness, DOE, balance deficits secondary to left MCA infarct.             -patient may shower             -ELOS/Goals: Should be ready Wed 6/16             PT, OT,SLP CIR level  2.  Antithrombotics: -DVT/anticoagulation:  Pharmaceutical: Heparin amb >50' d/c              -antiplatelet therapy: DAPT X 3 weeks 3. Pain Management: Neurontin bid for neuropathy. Tylenol prn.  4. Mood: LCSW to follow for evaluation and support.              -antipsychotic agents: N/A  5. Neuropsych: This patient is capable of making decisions on his own behalf. 6. Skin/Wound Care: routine pressure relief measures.  7. Fluids/Electrolytes/Nutrition: Monitor I/O.  CMP Improved creat will d/c IV  8. T2DM with neuropathy: Monitor BS ac/hs. Was on 70/30 insulin --30 units daily. Will resume at 5 units in am and titrate upwards.              Monitor with increased mobility CBG (last 3)  Recent Labs    10/12/19 1636 10/12/19 2100 10/13/19 0609  GLUCAP 114* 142* 103*  controlled 6/14  9. CKD III: BUN/SCr- back to baseline off Cozaar per cardiology             10. HTN: Monitor for orthostatic changes--reports of dizziness with activity. Permissive hypertension--on Coreg, cozaar             Vitals:   10/12/19 1954 10/13/19 0439  BP: (!) 145/70 (!) 135/56  Pulse: 73 76  Resp: 14 16  Temp: 98.4 F (36.9 C) 98.6 F (37 C)  SpO2: 98% 97%  controlled 6/15  Cozaar and bump Carvedilol to 25mg  BID home dose   HR in 70s 11.  NSVT: Asymptomatic. Back on Coreg and to follow up on outpatient basis.              Monitor heart rate with increased exertion  -appreciate Cards f/u,  12. Morbid obesity-BMI 44.7:  Educate on appropriate diet and importance of weight loss to help promote mobility and helth.  13.. H/o gout: Resume allopurinol  14. Dyslipidemia: Now on high dose statin.  15.  Anemia of chronic disease?:  CBC ordered  LOS: 6 days A FACE TO FACE EVALUATION WAS PERFORMED  Charlett Blake 10/13/2019, 8:48 AM

## 2019-10-14 DIAGNOSIS — N189 Chronic kidney disease, unspecified: Secondary | ICD-10-CM

## 2019-10-14 DIAGNOSIS — N179 Acute kidney failure, unspecified: Secondary | ICD-10-CM

## 2019-10-14 LAB — GLUCOSE, CAPILLARY
Glucose-Capillary: 108 mg/dL — ABNORMAL HIGH (ref 70–99)
Glucose-Capillary: 105 mg/dL — ABNORMAL HIGH (ref 70–99)

## 2019-10-14 MED ORDER — ACETAMINOPHEN 325 MG PO TABS: 325.0000 mg | ORAL_TABLET | ORAL | Status: DC | PRN

## 2019-10-14 MED ORDER — INSULIN NPH ISOPHANE & REGULAR (70-30) 100 UNIT/ML ~~LOC~~ SUSP: 5.0000 [IU] | Freq: Every day | SUBCUTANEOUS | 11 refills | Status: DC

## 2019-10-14 MED ORDER — CARVEDILOL 25 MG PO TABS: 25.0000 mg | ORAL_TABLET | Freq: Two times a day (BID) | ORAL | 0 refills | Status: DC

## 2019-10-14 MED ORDER — ATORVASTATIN CALCIUM 80 MG PO TABS: 80.0000 mg | ORAL_TABLET | Freq: Every day | ORAL | 0 refills | Status: DC

## 2019-10-14 MED ORDER — CLOPIDOGREL BISULFATE 75 MG PO TABS: 75.0000 mg | ORAL_TABLET | Freq: Every day | ORAL | 0 refills | Status: DC

## 2019-10-14 NOTE — Plan of Care (Signed)
°  Problem: RH SAFETY Goal: RH STG ADHERE TO SAFETY PRECAUTIONS W/ASSISTANCE/DEVICE Description: STG Adhere to Safety Precautions With cues and reminders Outcome: Progressing

## 2019-10-14 NOTE — Progress Notes (Signed)
Inpatient Rehabilitation Care Coordinator  Discharge Note  The overall goal for the admission was met for:   Discharge location: Yes  Length of Stay: Yes  Discharge activity level: Yes  Home/community participation: Yes  Services provided included: MD, RD, PT, OT, SLP, RN, CM, TR, Pharmacy and [place]: [employer]  Follow-up services arranged: Outpatient: [place]  Comments (or additional information):  Patient/Family verbalized understanding of follow-up arrangements: Yes  Individual responsible for coordination of the follow-up plan: elvira [phone]  Confirmed correct DME delivered: Dyanne Iha 10/14/2019    Dyanne Iha

## 2019-10-14 NOTE — Patient Care Conference (Signed)
Inpatient RehabilitationTeam Conference and Plan of Care Update Date: 10/14/2019   Time: 1:26 PM    Patient Name: Nathan Proctor.      Medical Record Number: [id number]  Date of Birth: Apr 14, 1941 Sex: Male         Room/Bed: 4W23C/4W23C-01 Payor Info: Payor: [employer] MEDICARE / Plan: [employer] MEDICARE CHOICE PPO / Product Type: *No Product type* /    Admit Date/Time:  10/07/2019  4:27 PM  Primary Diagnosis:  Acute ischemic left MCA stroke [facility])  Patient Active Problem List   Diagnosis Date Noted  . Acute ischemic left MCA stroke ([place]) 10/07/2019  . Morbid obesity ([place])   . NSVT (nonsustained ventricular tachycardia) ([place])   . Diabetic peripheral neuropathy ([place])   . Acute ischemic stroke ([place]) 10/04/2019  . Hypoglycemia due to insulin 10/04/2019  . CKD (chronic kidney disease), stage III 10/04/2019  . Dehydration 10/04/2019  . Dyslipidemia 10/04/2019  . Stroke ([place]) 10/04/2019  . Ankle fracture, left 02/29/2016  . Closed left ankle fracture 02/29/2016  . Gout 02/20/2016  . Essential hypertension 02/20/2016    Expected Discharge Date: Expected Discharge Date: 10/14/19  Team Members Present: Physician leading conference: Dr. Alysia Penna Care Coodinator Present: Nestor Lewandowsky, RN, BSN, CRRN;Christina Sampson Goon, [place] Nurse Present: Debroah Loop, RN PT Present: Phylliss Bob, PTA OT Present: Darleen Crocker, OT PPS Coordinator present : Ileana Ladd, PT     Current Status/Progress Goal Weekly Team Focus  Bowel/Bladder   Patient is continent of bowel and bladder. LBM 10/11/19  Maintain continence of bowel and bladder  patient is independent, nursing to monitor q shift output of bowel and bladder   Swallow/Nutrition/ Hydration             ADL's   Mod I overall, using RW at ambulatory level  Mod I overall  D/c planning   Mobility   independent bed mobitily, mod I transfers, mod I gait without AD  mod I  higher level balance, endurance, transfersm d/c planning    Communication             Safety/Cognition/ Behavioral Observations            Pain   no c/o pain  maintain pain free  q shift pain assessment, provide pain interventions PRN   Skin   patient have MASD in abdominal folds  prevent skin from breakdown  Q shift skin assessment    Rehab Goals Patient on target to meet rehab goals: Yes Rehab Goals Revised: on target with current goals *See Care Plan and progress notes for long and short-term goals.     Barriers to Discharge  Current Status/Progress Possible Resolutions Date Resolved   Nursing                  PT                    OT                  SLP                Care Coordinator     on target          Discharge Planning/Teaching Needs:  home with spouse and son to provide care  will schedule if needed   Team Discussion:  On target to meet mod I goals; has all of his equipment for discharge.  Revisions to Treatment Plan:      Medical Summary  I attest that I was present, lead the team conference, and concur with the assessment and plan of the team.   Margarito Liner 10/14/2019, 1:26 PM

## 2019-10-14 NOTE — Discharge Summary (Signed)
Physician Discharge Summary  Patient ID: [name]. MRN: [id number] DOB/AGE: January 14, 1941 79 y.o.  Admit date: 10/07/2019 Discharge date: 10/14/2019  Discharge Diagnoses:  Principal Problem:   Acute ischemic left MCA stroke [facility]) Active Problems:   Essential hypertension   Dyslipidemia   Morbid obesity ([place])   Acute on chronic renal failure [facility])   Discharged Condition: Stable   Significant Diagnostic Studies: N/A   Labs:  Basic Metabolic Panel: Recent Labs  Lab 10/08/19 0035 10/10/19 0512 10/11/19 0538 10/12/19 0708  NA 142 139 137 140  K 4.1 4.2 4.0 4.2  CL 108 106 105 109  CO2 25 23 23 24   GLUCOSE 117* 98 108* 109*  BUN 36* 32* 28* 24*  CREATININE 2.08* 1.50* 1.46* 1.43*  CALCIUM 9.3 9.0 8.8* 9.0    CBC: Recent Labs  Lab 10/08/19 0035 10/12/19 0708  WBC 5.7 4.9  NEUTROABS 3.1  --   HGB 11.0* 10.2*  HCT 34.3* 32.0*  MCV 95.5 95.2  PLT 175 174    CBG: Recent Labs  Lab 10/13/19 0609 10/13/19 1127 10/13/19 1649 10/13/19 2059 10/14/19 0610  GLUCAP 103* 118* 130* 108* 105*    Brief HPI:   Anacleto Batterman. is a 79 y.o. male with history of HTN, T2DM, CKDIII, morbid obesity, prostate cancer; who was admitted on 10/03/2019 with persistent right-sided weakness and numbness that had started a.m. the day before.  Blood sugar was 57 at admission and TPA not administered due to late presentation.  MRI/MRI brain done revealing acute left MCA infarct affecting left parietal cortex.  2D echo showed EF of 45 to 50% with severe LVH, mild aortic sclerosis, aortic dilatation and global hypokinesis.  He had few episodes of NSVT 06/08 and cardiology recommended resuming Coreg.  2D echo was repeated showing EF 40 to 45% with global hypokinesis. Dr. Harrington Challenger recommended 3 weeks Zio patch at discharge.  Hospital course was complicated by acute on chronic renal failure which improved with IVF for hydration.  Therapy was ongoing and patient limited by weakness,  dizziness and DOE. CIR recommended due to functional decline.    Hospital Course: Jullian Clayson. was admitted to rehab 10/07/2019 for inpatient therapies to consist of PT and OT at least three hours five days a week. Past admission physiatrist, therapy team and rehab RN have worked together to provide customized collaborative inpatient rehab.  Blood pressures were monitored on TID basis and and has been reasonably controlled on Coreg twice daily.  No arrhythmia or tachycardia reported with increase in activity.  70/30 insulin was resumed at 5 units daily and his blood sugars have been relatively controlled on this dose.  He was advised to continue monitoring blood sugars on ac/hs basis and slowly increase insulin by 5 units daily if blood sugars remain greater than 150 consistently.  Follow-up labs shows acute on chronic renal failure is resolving.  Follow-up CBC showed mild drop in H&H question due to hemodilution.  He has made good gains during his rehab stay and is modified independent at discharge. He will continue to receive follow up outpatient PT and OT at [facility] after discharge.    Rehab course: During patient's stay in rehab weekly team conferences were held to monitor patient's progress, set goals and discuss barriers to discharge. At admission, patient required CGA/min assist with mobility and min assist with ADL tasks. He  has had improvement in activity tolerance, balance, postural control as well as ability to compensate  for deficits.  He is able to complete ADL tasks at modified independent level. He is modified independent for transfers and to ambulate 150' without AD. Family is able to provide care as needed.   Disposition: Home  Diet: Heart Healthy/Carb Modified.   Special Instructions: 1. Recommend repeat CBC/BMET in a week. 2. Stop Plavix after 10 days.  3. No driving till cleared by MD   Discharge Instructions    Ambulatory referral to Physical Medicine Rehab    Complete by: As directed    1-2 weeks TC appt     Allergies as of 10/14/2019   No Known Allergies     Medication List    STOP taking these medications   amLODipine 5 MG tablet Commonly known as: NORVASC   gabapentin 300 MG capsule Commonly known as: NEURONTIN   tiZANidine 4 MG tablet Commonly known as: ZANAFLEX     TAKE these medications   acetaminophen 325 MG tablet Commonly known as: TYLENOL Take 1-2 tablets (325-650 mg total) by mouth every 4 (four) hours as needed for mild pain. What changed:   medication strength  how much to take  when to take this   allopurinol 100 MG tablet Commonly known as: ZYLOPRIM Take 100 mg by mouth daily.   aspirin 81 MG EC tablet Take 1 tablet (81 mg total) by mouth daily.   atorvastatin 80 MG tablet Commonly known as: LIPITOR Take 1 tablet (80 mg total) by mouth daily.   carvedilol 25 MG tablet Commonly known as: COREG Take 1 tablet (25 mg total) by mouth 2 (two) times daily with a meal. What changed: when to take this   clopidogrel 75 MG tablet Commonly known as: PLAVIX Take 1 tablet (75 mg total) by mouth daily.   insulin NPH-regular Human (70-30) 100 UNIT/ML injection Inject 5 Units into the skin daily. What changed: how much to take   linaclotide 145 MCG Caps capsule Commonly known as: LINZESS Take 145 mcg by mouth daily as needed (constipation.).   Vitamin D (Ergocalciferol) 1.25 MG (50000 UNIT) Caps capsule Commonly known as: DRISDOL Take 50,000 Units by mouth every 7 (seven) days.       Follow-up Information    Fay Records, MD Follow up.   Specialty: Cardiology Why: Our office will call you to arrange follow-up appointment.  Contact information: [place] [address] [phone]        Nolene Ebbs, MD. Call on 10/15/2019.   Specialty: Internal Medicine Why: for post hospital follow up Contact information: [place]  [address] [phone]        Charlett Blake, MD Follow up.   Specialty: Physical Medicine and Rehabilitation Why: Office will call you with follow up appointment Contact information: [place] [address] [phone]               Signed: Bary Leriche 10/14/2019, 11:41 PM

## 2019-10-14 NOTE — Progress Notes (Signed)
[facility] PHYSICAL MEDICINE & REHABILITATION PROGRESS NOTE   Subjective/Complaints:  Pt without c/o cont Bowel and bladder   ROS: Patient denies CP, SOB, N/V/D   No results found. Recent Labs    10/12/19 0708  WBC 4.9  HGB 10.2*  HCT 32.0*  PLT 174   Recent Labs    10/12/19 0708  NA 140  K 4.2  CL 109  CO2 24  GLUCOSE 109*  BUN 24*  CREATININE 1.43*  CALCIUM 9.0    Intake/Output Summary (Last 24 hours) at 10/14/2019 1002 Last data filed at 10/14/2019 0705 Gross per 24 hour  Intake 520 ml  Output 1025 ml  Net -505 ml     Physical Exam: Vital Signs Blood pressure 128/63, pulse 70, temperature 98 F (36.7 C), temperature source Oral, resp. rate 16, height 5\' 10"  (1.778 m), weight (!) 137 kg, SpO2 97 %.    Constitutional: No distress . Vital signs reviewed. HEENT: EOMI, oral membranes moist Neck: supple Cardiovascular: RRR without murmur. No JVD    Respiratory/Chest: CTA Bilaterally without wheezes or rales. Normal effort    GI/Abdomen: BS +, non-tender, non-distended Ext: no clubbing, cyanosis, or edema Psych: pleasant and cooperative Skin: No evidence of breakdown, no evidence of rash Neurologic: no nystagmus, good sitting balance. Cranial nerves II through XII intact, motor strength is 5/5 in bilateral deltoid, bicep, tricep, grip, hip flexor, knee extensors, ankle dorsiflexor and plantar flexor Sensory exam normal sensation to light touch and proprioception in bilateral upper and lower extremities. Decreased [place] RUE Skin: intact.  Musculoskeletal: Full range of motion in all 4 extremities. No joint swelling   Assessment/Plan: 1. Functional deficits secondary to Left MCA infarct Stable for D/C today F/u PCP in 3-4 weeks F/u PM&R 2 weeks See D/C summary See D/C instructions Care Tool:  Bathing    Body parts bathed by patient: Right arm, Left arm, Chest, Abdomen, Front perineal area, Right upper leg, Left upper leg, Face, Buttocks, Right lower leg,  Left lower leg   Body parts bathed by helper: Buttocks, Right lower leg, Left lower leg     Bathing assist Assist Level: Independent with assistive device Assistive Device Comment: TTB, seated   Upper Body Dressing/Undressing Upper body dressing   What is the patient wearing?: Pull over shirt    Upper body assist Assist Level: Independent with assistive device    Lower Body Dressing/Undressing Lower body dressing      What is the patient wearing?: Underwear/pull up, Pants     Lower body assist Assist for lower body dressing: Independent with assitive device     Toileting Toileting    Toileting assist Assist for toileting: Independent with assistive device Assistive Device Comment:  (rolling walker)   Transfers Chair/bed transfer  Transfers assist     Chair/bed transfer assist level: Independent Chair/bed transfer assistive device: Museum/gallery exhibitions officer assist      Assist level: Independent Assistive device: No Device Max distance: 168ft   Walk 10 feet activity   Assist     Assist level: Independent Assistive device: No Device   Walk 50 feet activity   Assist    Assist level: Independent Assistive device: No Device    Walk 150 feet activity   Assist Walk 150 feet activity did not occur: Safety/medical concerns (max distance 141 feet)  Assist level: Independent Assistive device: No Device    Walk 10 feet on uneven surface  activity   Assist  Assist level: Supervision/Verbal cueing Assistive device:  (no AD)   Wheelchair     Assist Will patient use wheelchair at discharge?: No   Wheelchair activity did not occur: N/A         Wheelchair 50 feet with 2 turns activity    Assist    Wheelchair 50 feet with 2 turns activity did not occur: N/A       Wheelchair 150 feet activity     Assist  Wheelchair 150 feet activity did not occur: N/A       Blood pressure 128/63, pulse 70,  temperature 98 F (36.7 C), temperature source Oral, resp. rate 16, height 5\' 10"  (1.778 m), weight (!) 137 kg, SpO2 97 %.    Medical Problem List and Plan: 1.  Dizziness, DOE, balance deficits secondary to left MCA infarct.             -patient may shower             D/C Wed 6/16             PT, OT,SLP CIR level  2.  Antithrombotics: -DVT/anticoagulation:  Pharmaceutical: Heparin amb >50' d/c              -antiplatelet therapy: DAPT X 3 weeks 3. Pain Management: Neurontin bid for neuropathy. Tylenol prn.  4. Mood: LCSW to follow for evaluation and support.              -antipsychotic agents: N/A  5. Neuropsych: This patient is capable of making decisions on his own behalf. 6. Skin/Wound Care: routine pressure relief measures.  7. Fluids/Electrolytes/Nutrition: Monitor I/O.  CMP Improved creat will d/c IV  8. T2DM with neuropathy: Monitor BS ac/hs. Was on 70/30 insulin --30 units daily. Will resume at 5 units in am and titrate upwards.              Monitor with increased mobility CBG (last 3)  Recent Labs    10/13/19 1649 10/13/19 2059 10/14/19 0610  GLUCAP 130* 108* 105*  controlled 6/14  9. CKD III: BUN/SCr- back to baseline off Cozaar per cardiology             10. HTN: Monitor for orthostatic changes--reports of dizziness with activity. Permissive hypertension--on Coreg, cozaar             Vitals:   10/13/19 1934 10/14/19 0613  BP: (!) 146/84 128/63  Pulse: 63 70  Resp: 16 16  Temp: 98.1 F (36.7 C) 98 F (36.7 C)  SpO2: 99% 97%  controlled 6/15  Cozaar and bump Carvedilol to 25mg  BID home dose   HR in 70s 11. NSVT: Asymptomatic. Back on Coreg and to follow up on outpatient basis.              Monitor heart rate with increased exertion  -appreciate Cards f/u,  12. Morbid obesity-BMI 44.7:  Educate on appropriate diet and importance of weight loss to help promote mobility and helth.  13.. H/o gout: Resume allopurinol  14. Dyslipidemia: Now on high dose statin.  15.   Anemia of chronic disease?:  CBC ordered  LOS: 7 days A FACE TO FACE EVALUATION WAS PERFORMED  Charlett Blake 10/14/2019, 10:02 AM

## 2019-10-14 NOTE — Discharge Instructions (Signed)
Inpatient Rehab Discharge Instructions  Cordelro Gautreau. Discharge date and time: 10/14/19    Activities/Precautions/ Functional Status: Activity: no lifting, driving, or strenuous exercise till cleared by MD Diet: cardiac diet and diabetic diet Wound Care: none needed   Functional status:  ___ No restrictions     ___ Walk up steps independently ___ 24/7 supervision/assistance   ___ Walk up steps with assistance _X__ Intermittent supervision/assistance  ___ Bathe/dress independently ___ Walk with walker     ___ Bathe/dress with assistance ___ Walk Independently    ___ Shower independently ___ Walk with assistance    __X_ Shower with assistance _X__ No alcohol     ___ Return to work/school ________  Special Instructions: 1. Monitor blood sugars before meals and at bedtime. If blood sugars are over 140 in 24 hour period increase insulin by 5 units daily to home dose.    COMMUNITY REFERRALS UPON DISCHARGE:    Outpatient: PT     OT                Agency: [facility] Phone: [phone]              Appointment Date/Time: Facility to schedule with family   STROKE/TIA DISCHARGE INSTRUCTIONS SMOKING Cigarette smoking nearly doubles your risk of having a stroke & is the single most alterable risk factor  If you smoke or have smoked in the last 12 months, you are advised to quit smoking for your health.  Most of the excess cardiovascular risk related to smoking disappears within a year of stopping.  Ask you doctor about anti-smoking medications  [place] Quit Line: 1-800-QUIT NOW  Free Smoking Cessation Classes [phone]  CHOLESTEROL Know your levels; limit fat & cholesterol in your diet  Lipid Panel     Component Value Date/Time   CHOL 164 10/04/2019 0531   TRIG 45 10/04/2019 0531   HDL 50 10/04/2019 0531   CHOLHDL 3.3 10/04/2019 0531   VLDL 9 10/04/2019 0531   LDLCALC 105 (H) 10/04/2019 0531      Many patients benefit from treatment even  if their cholesterol is at goal.  Goal: Total Cholesterol (CHOL) less than 160  Goal:  Triglycerides (TRIG) less than 150  Goal:  HDL greater than 40  Goal:  LDL (LDLCALC) less than 100   BLOOD PRESSURE American Stroke Association blood pressure target is less that 120/80 mm/Hg  Your discharge blood pressure is:  BP: 128/63  Monitor your blood pressure  Limit your salt and alcohol intake  Many individuals will require more than one medication for high blood pressure  DIABETES (A1c is a blood sugar average for last 3 months) Goal HGBA1c is under 7% (HBGA1c is blood sugar average for last 3 months)  Diabetes:     Lab Results  Component Value Date   HGBA1C 5.6 10/04/2019     Your HGBA1c can be lowered with medications, healthy diet, and exercise.  Check your blood sugar as directed by your physician  Call your physician if you experience unexplained or low blood sugars.  PHYSICAL ACTIVITY/REHABILITATION Goal is 30 minutes at least 4 days per week  Activity: No driving, Therapies: see above Return to work: N/A  Activity decreases your risk of heart attack and stroke and makes your heart stronger.  It helps control your weight and blood pressure; helps you relax and can improve your mood.  Participate in a regular exercise program.  Talk with your doctor about the best form  of exercise for you (dancing, walking, swimming, cycling).  DIET/WEIGHT Goal is to maintain a healthy weight  Your discharge diet is:  Diet Order            Diet heart healthy/carb modified Room service appropriate? Yes; Fluid consistency: Thin  Diet effective now                liquids Your height is:  Height: 5\' 10"  (177.8 cm) Your current weight is: Weight: (!) 137 kg- 301 lbs Your Body Mass Index (BMI) is:  BMI (Calculated): 43.34  Following the type of diet specifically designed for you will help prevent another stroke.  Your goal weight  is:  174 lbs  Your goal Body Mass Index (BMI) is  19-24.  Healthy food habits can help reduce 3 risk factors for stroke:  High cholesterol, hypertension, and excess weight.  RESOURCES Stroke/Support Group:  Call [phone]   STROKE EDUCATION PROVIDED/REVIEWED AND GIVEN TO PATIENT Stroke warning signs and symptoms How to activate emergency medical system (call 911). Medications prescribed at discharge. Need for follow-up after discharge. Personal risk factors for stroke. Pneumonia vaccine given:  Flu vaccine given:  My questions have been answered, the writing is legible, and I understand these instructions.  I will adhere to these goals & educational materials that have been provided to me after my discharge from the hospital.      My questions have been answered and I understand these instructions. I will adhere to these goals and the provided educational materials after my discharge from the hospital.  Patient/Caregiver Signature _______________________________ Date __________  Clinician Signature _______________________________________ Date __________  Please bring this form and your medication list with you to all your follow-up doctor's appointments.

## 2019-10-16 ENCOUNTER — Telehealth: Payer: Self-pay | Admitting: Registered Nurse

## 2019-10-16 ENCOUNTER — Telehealth: Payer: Self-pay | Admitting: Radiology

## 2019-10-16 NOTE — Telephone Encounter (Signed)
Enrolled patient for a 30 day [employer] monitor to be mailed to patients home. Brief instructions were gone over with the patient and his wife.

## 2019-10-16 NOTE — Telephone Encounter (Signed)
Placed a call to Nathan Proctor no answer. Left message to return the call.

## 2019-10-20 ENCOUNTER — Ambulatory Visit: Payer: Medicare PPO | Admitting: Cardiovascular Disease

## 2019-10-23 ENCOUNTER — Other Ambulatory Visit: Payer: Self-pay | Admitting: Physical Medicine and Rehabilitation

## 2019-10-27 ENCOUNTER — Other Ambulatory Visit: Payer: Self-pay

## 2019-10-27 ENCOUNTER — Encounter: Payer: Medicare PPO | Attending: Registered Nurse | Admitting: Registered Nurse

## 2019-10-27 VITALS — BP 137/89 | HR 79 | Temp 98.5°F | Ht 70.0 in | Wt 302.0 lb

## 2019-10-27 DIAGNOSIS — I10 Essential (primary) hypertension: Secondary | ICD-10-CM | POA: Diagnosis present

## 2019-10-27 DIAGNOSIS — E785 Hyperlipidemia, unspecified: Secondary | ICD-10-CM | POA: Insufficient documentation

## 2019-10-27 DIAGNOSIS — I63512 Cerebral infarction due to unspecified occlusion or stenosis of left middle cerebral artery: Secondary | ICD-10-CM | POA: Diagnosis present

## 2019-10-27 NOTE — Progress Notes (Signed)
Subjective:    Patient ID: Nathan Portela., male    DOB: 1940-09-26, 79 y.o.   MRN: [id number]  HPI: Nathan Rietz. is a 79 y.o. male who is here for Transitional care visit in follow up of his Acute ischemic left MCA stroke, essential hypertension, dyslipidemia and morbid obesity. He presented to [facility] Emergency room via EMS on 10/03/2019 with complaints of right sided weakness. Neurology was consulted.  CT Head WO Contrast:  IMPRESSION: 1. No CT evidence of acute intracranial abnormality.  ASPECTS is 10. 2. Mild generalized parenchymal atrophy and chronic small vessel ischemic disease  MRI Brain WO Contrast:  IMPRESSION: Areas of acute infarction affecting cortex in the left parietal region consistent with left MCA branch vessel territory infarction. Underlying white matter is largely spared. No swelling or hemorrhage.  Mild chronic small-vessel change elsewhere affecting the cerebral hemispheric white matter.  Smooth plaque affecting the ICA bulb on the left with stenosis of 30%.  Cannot rule out vertebral artery origin narrowing. Detail is limited because of physiologic chest motion.  No intracranial large or medium vessel occlusion identified presently.  2 mm left periophthalmic aneurysm.  He was admitted to inpatient rehabilitation on 10/07/2019 and discharged home on 10/14/2019. He will be  attending outpatient therapy at [facility]Rehabilitation. He denies any pain. He rates his pain 0. Also reports he has a good appetite.   Wife in the room, all questions answered.      Pain Inventory Average Pain 0 Pain Right Now 0 My pain is na  In the last 24 hours, has pain interfered with the following? General activity 0 Relation with others 0 Enjoyment of life 0 What TIME of day is your pain at its worst? na Sleep (in general) Good  Pain is worse with: na Pain improves with: na Relief from Meds: na  Mobility use a walker do you drive?   no  Function I need assistance with the following:  meal prep, household duties and shopping  Neuro/Psych numbness trouble walking dizziness  Prior Studies TC appt  Physicians involved in your care TC appt   Family History  Problem Relation Age of Onset  . Heart Problems Father   . Heart Problems Sister    Social History   Socioeconomic History  . Marital status: Married    Spouse name: Not on file  . Number of children: Not on file  . Years of education: Not on file  . Highest education level: Not on file  Occupational History  . Not on file  Tobacco Use  . Smoking status: Former Smoker    Types: Cigarettes    Quit date: 1979    Years since quitting: 42.5  . Smokeless tobacco: Never Used  Substance and Sexual Activity  . Alcohol use: Yes    Comment: Occasionally  . Drug use: No  . Sexual activity: Not on file  Other Topics Concern  . Not on file  Social History Narrative  . Not on file   Social Determinants of Health   Financial Resource Strain:   . Difficulty of Paying Living Expenses:   Food Insecurity:   . Worried About Charity fundraiser in the Last Year:   . Arboriculturist in the Last Year:   Transportation Needs:   . Film/video editor (Medical):   Marland Kitchen Lack of Transportation (Non-Medical):   Physical Activity:   . Days of Exercise per Week:   . Minutes of Exercise  per Session:   Stress:   . Feeling of Stress :   Social Connections:   . Frequency of Communication with Friends and Family:   . Frequency of Social Gatherings with Friends and Family:   . Attends Religious Services:   . Active Member of Clubs or Organizations:   . Attends Archivist Meetings:   Marland Kitchen Marital Status:    Past Surgical History:  Procedure Laterality Date  . ORIF ANKLE FRACTURE Left 02/29/2016   Procedure: OPEN REDUCTION INTERNAL FIXATION (ORIF) ANKLE FRACTURE;  Surgeon: Nicholes Stairs, MD;  Location: [facility] ORS;  Service: Orthopedics;  Laterality:  Left;  . PROSTATE SURGERY     Past Medical History:  Diagnosis Date  . Anemia of chronic disease   . Ankle fracture 02/15/2016  . Cancer [facility])    Prostate  . Chronic constipation   . Chronic kidney disease    stage III  . Diabetes mellitus without complication ([place])    type II   . Diabetic peripheral neuropathy ([place])   . Failure to thrive (0-17)   . Fracture of left lower leg   . Gout   . Hyperlipidemia   . Hypertension   . Morbid obesity ([place])   . Unstable gait    BP 137/89   Pulse 79   Temp 98.5 F (36.9 C)   Ht 5\' 10"  (1.778 m)   Wt (!) 302 lb (137 kg)   SpO2 95%   BMI 43.33 kg/m   Opioid Risk Score:   Fall Risk Score:  `1  Depression screen PHQ 2/9  Depression screen PHQ 2/9 10/27/2019  Decreased Interest 0  Down, Depressed, Hopeless 0  PHQ - 2 Score 0  Altered sleeping 0  Tired, decreased energy 1  Change in appetite 0  Feeling bad or failure about yourself  0  Trouble concentrating 0  Moving slowly or fidgety/restless 0  Suicidal thoughts 0  PHQ-9 Score 1  Difficult doing work/chores Not difficult at all   Review of Systems  Musculoskeletal: Positive for gait problem.  Neurological: Positive for dizziness and numbness.  All other systems reviewed and are negative.      Objective:   Physical Exam Vitals and nursing note reviewed.  Constitutional:      Appearance: Normal appearance. He is obese.  Cardiovascular:     Rate and Rhythm: Normal rate and regular rhythm.     Pulses: Normal pulses.     Heart sounds: Normal heart sounds.  Pulmonary:     Effort: Pulmonary effort is normal.     Breath sounds: Normal breath sounds.  Musculoskeletal:     Cervical back: Normal range of motion and neck supple.     Comments: Normal Muscle Bulk and Muscle Testing Reveals:  Upper Extremities: Full ROM and Muscle Strength 5/5  Lower Extremities: Full ROM and Muscle Strength 5/5 Arises from Table Slowly Unsteady  Gait with Tandem Walking  Skin:    General:  Skin is warm and dry.  Neurological:     Mental Status: He is oriented to person, place, and time.  Psychiatric:        Mood and Affect: Mood normal.        Behavior: Behavior normal.           Assessment & Plan:  1. Acute ischemic left MCA stroke: He was instructed to call [facility] to schedule HFU, Mr. And Mrs piotr christopher understanding.  2. Essential hypertension: Continue current medication regimen. PCP Following.  3. Dyslipidemia: Continue current medication regimen. PCP Following.  4.Morbid obesity.Continue with Healthy Diet regimen and HEP as tolerated. Continue to Monitor.   30 minutes of face to face patient care time was spent during this visit. All questions were encouraged and answered.  F/U in 4-6 weeks with Dr Letta Pate

## 2019-11-04 ENCOUNTER — Encounter: Payer: Self-pay | Admitting: Registered Nurse

## 2019-11-06 ENCOUNTER — Ambulatory Visit: Payer: Medicare PPO | Admitting: Occupational Therapy

## 2019-11-06 ENCOUNTER — Ambulatory Visit: Payer: Medicare PPO | Attending: Physical Medicine and Rehabilitation | Admitting: Physical Therapy

## 2019-11-06 ENCOUNTER — Other Ambulatory Visit: Payer: Self-pay

## 2019-11-06 ENCOUNTER — Encounter: Payer: Self-pay | Admitting: Occupational Therapy

## 2019-11-06 VITALS — BP 156/85 | HR 77

## 2019-11-06 DIAGNOSIS — R278 Other lack of coordination: Secondary | ICD-10-CM | POA: Diagnosis present

## 2019-11-06 DIAGNOSIS — M6281 Muscle weakness (generalized): Secondary | ICD-10-CM | POA: Diagnosis present

## 2019-11-06 DIAGNOSIS — I63512 Cerebral infarction due to unspecified occlusion or stenosis of left middle cerebral artery: Secondary | ICD-10-CM | POA: Diagnosis present

## 2019-11-06 DIAGNOSIS — R2681 Unsteadiness on feet: Secondary | ICD-10-CM | POA: Diagnosis present

## 2019-11-06 DIAGNOSIS — R2689 Other abnormalities of gait and mobility: Secondary | ICD-10-CM | POA: Insufficient documentation

## 2019-11-06 NOTE — Therapy (Signed)
[facility] [address] [place], [place], [address] Phone: [phone]   Fax:  [phone]  Occupational Therapy Treatment  Patient Details  Name: Nathan Proctor. MRN: [id number] Date of Birth: Aug 15, 1940 Referring Provider (OT): Dr. Letta Pate   Encounter Date: 11/06/2019   OT End of Session - 11/06/19 1332    Visit Number 1    Number of Visits 9    Date for OT Re-Evaluation 12/06/19    Authorization Type [employer] [facility]    Authorization Time Period 90 days    OT Start Time 1145    OT Stop Time 1210    OT Time Calculation (min) 25 min    Activity Tolerance Patient tolerated treatment well    Behavior During Therapy WFL for tasks assessed/performed           Past Medical History:  Diagnosis Date  . Anemia of chronic disease   . Ankle fracture 02/15/2016  . Cancer [facility])    Prostate  . Chronic constipation   . Chronic kidney disease    stage III  . Diabetes mellitus without complication ([place])    type II   . Diabetic peripheral neuropathy ([place])   . Failure to thrive (0-17)   . Fracture of left lower leg   . Gout   . Hyperlipidemia   . Hypertension   . Morbid obesity ([place])   . Unstable gait     Past Surgical History:  Procedure Laterality Date  . ORIF ANKLE FRACTURE Left 02/29/2016   Procedure: OPEN REDUCTION INTERNAL FIXATION (ORIF) ANKLE FRACTURE;  Surgeon: Nicholes Stairs, MD;  Location: [facility];  Service: Orthopedics;  Laterality: Left;  . PROSTATE SURGERY      There were no vitals filed for this visit.   Subjective Assessment - 11/06/19 1151    Subjective  I want to to be more active    Patient Stated Goals to be close to independent    Currently in Pain? No/denies              [facility] OT Assessment - 11/06/19 1154      Assessment   Medical Diagnosis Acute ischemic L MCA CVA 10/03/2019    Referring Provider (OT) Dr. Letta Pate    Onset Date/Surgical Date 10/03/19    Hand Dominance  Right      Precautions   Precautions Fall    Precaution Comments No driving       Balance Screen   Has the patient fallen in the past 6 months No    Has the patient had a decrease in activity level because of a fear of falling?  No    Is the patient reluctant to leave their home because of a fear of falling?  No      Home  Environment   Family/patient expects to be discharged to: Private residence    Lives With Spouse      Prior Function   Level of [place] Has a treadmill at home and was using occasionally; managed several properties prior to CVA, went to larger stores (used motorized cart)      ADL   Eating/Feeding Modified independent    Grooming Modified independent    Upper Body Bathing Modified independent    Upper Body Dressing Independent    Lower Body Dressing Modified independent    Toilet Transfer Modified independent    Tub/Shower Transfer Modified independent      Mobility  Mobility Status Independent   with cane     Vision - History   Baseline Vision No visual deficits      Vision Assessment   Vision Assessment Vision not tested      Cognition   Overall Cognitive Status Within Functional Limits for tasks assessed      Sensation   Light Touch Appears Intact      Coordination   Fine Motor Movements are Fluid and Coordinated No    9 Hole Peg Test Right;Left    Right 9 Hole Peg Test 39.97    Left 9 Hole Peg Test 28.94    Coordination mild impairment in RUE      ROM / Strength   AROM / PROM / Strength AROM;Strength      AROM   Overall AROM  Within functional limits for tasks performed      Strength   Overall Strength Deficits    Overall Strength Comments grossly 4/5 bilaterally      Hand Function   Right Hand Grip (lbs) 66.5 lbs    Left Hand Grip (lbs) 62.8 lbs                                 OT Long Term Goals - 11/06/19 1339      OT LONG TERM GOAL #1   Title I with HEP coordination,and  proximal UE strength    Time 4    Period Weeks    Status New      OT LONG TERM GOAL #2   Title Pt will demonstrate increased RUE fine motor coordination for ADLs as evidenced by decreasing 9 hole peg test score to 35 secs or less.    Baseline RUe39.97, LUE 28.94    Time 4    Period Weeks    Status New      OT LONG TERM GOAL #3   Title Pt will perfrom a physical and cognitve task simultaneously with 90% or better accuracy in prep for return to driving and grocery shopping.    Time 4    Period Weeks    Status New                 Plan - 11/06/19 1334    Clinical Impression Statement Nathan Sagar. is a 79 y.o. male who has had a past medical history of diabetes, prostate cancer, CKD 3, diabetic neuropathy, morbid obesity, hypertension, hyperlipidemia, presenting with sudden onset of right arm weakness and slurred speech; admitted to the hospital 10/03/19 MRI reported: acute infarction affecting cortex in the left parietal region consistent with left MCA branch vessel territory infarction. Pt presents with the following deficits:decreased strength, decreased coordination, decreased balance, decreased functional mobility which impedes daily activities.Pt can benefit fom skilled occupational therapy to address these deficits in order to maximize pt's safety and I with ADLs/IADLs.    OT Occupational Profile and History Problem Focused Assessment - Including review of records relating to presenting problem    Occupational performance deficits (Please refer to evaluation for details): ADL's;IADL's;Leisure;Social Participation    Body Structure / Function / Physical Skills ADL;Gait;Strength;GMC;Dexterity;Balance;UE functional use;ROM;IADL;Endurance;Mobility;Flexibility;Coordination;Decreased knowledge of precautions;FMC;Decreased knowledge of use of DME    Rehab Potential Good    Clinical Decision Making Limited treatment options, no task modification necessary    Comorbidities  Affecting Occupational Performance: May have comorbidities impacting occupational performance    Modification or Assistance to Complete Evaluation  No modification of tasks or assist necessary to complete eval    OT Frequency 2x / week    OT Duration 4 weeks   plus eval   OT Treatment/Interventions Self-care/ADL training;Moist Heat;Fluidtherapy;DME and/or AE instruction;Therapeutic activities;Aquatic Therapy;Gait Training;Ultrasound;Therapeutic exercise    Plan initiate coordination HEP for RUE, then following visit address light proximal strenghtening    Consulted and Agree with Plan of Care Patient;Family member/caregiver           Patient will benefit from skilled therapeutic intervention in order to improve the following deficits and impairments:   Body Structure / Function / Physical Skills: ADL, Gait, Strength, GMC, Dexterity, Balance, UE functional use, ROM, IADL, Endurance, Mobility, Flexibility, Coordination, Decreased knowledge of precautions, FMC, Decreased knowledge of use of DME       Visit Diagnosis: Other lack of coordination - Plan: Ot plan of care cert/re-cert  Muscle weakness (generalized) - Plan: Ot plan of care cert/re-cert  Other abnormalities of gait and mobility - Plan: Ot plan of care cert/re-cert    Problem List Patient Active Problem List   Diagnosis Date Noted  . Acute on chronic renal failure ([place]) 10/14/2019  . Acute ischemic left MCA stroke ([place]) 10/07/2019  . Morbid obesity ([place])   . NSVT (nonsustained ventricular tachycardia) ([place])   . Diabetic peripheral neuropathy ([place])   . Acute ischemic stroke ([place]) 10/04/2019  . Hypoglycemia due to insulin 10/04/2019  . CKD (chronic kidney disease), stage III 10/04/2019  . Dehydration 10/04/2019  . Dyslipidemia 10/04/2019  . Stroke ([place]) 10/04/2019  . Ankle fracture, left 02/29/2016  . Closed left ankle fracture 02/29/2016  . Gout 02/20/2016  . Essential hypertension 02/20/2016     Nathan Proctor 11/06/2019, 1:47 PM Theone Murdoch, OTR/L [phone] Phone: [phone] 1:47 PM 11/06/19 [facility] [address] [place], [place], [address] Phone: [phone]   Fax:  [phone]  Name: Nathan Proctor. MRN: [id number] Date of Birth: 28-Oct-1940

## 2019-11-06 NOTE — Therapy (Signed)
[facility] [address] [place], [place], [address] Phone: [phone]   Fax:  [phone]  Physical Therapy Evaluation  Patient Details  Name: Nathan Proctor. MRN: [id number] Date of Birth: [birth year] Referring Provider (PT): Dr. Letta Pate   Encounter Date: 11/06/2019   PT End of Session - 11/06/19 1628    Visit Number 1    Number of Visits 9    Date for PT Re-Evaluation 62/69/48   60 day cert for 4-wk POC (pt did not schedule at eval day, so may be delayed in scheduling)   Authorization Type [employer]    Progress Note Due on Visit 10    PT Start Time 1102    PT Stop Time 1146    PT Time Calculation (min) 44 min    Activity Tolerance Patient tolerated treatment well;Patient limited by fatigue   fatigued at end of session; O2 sats 97%, HR 84 bpm   Behavior During Therapy WFL for tasks assessed/performed           Past Medical History:  Diagnosis Date  . Anemia of chronic disease   . Ankle fracture 02/15/2016  . Cancer [facility])    Prostate  . Chronic constipation   . Chronic kidney disease    stage III  . Diabetes mellitus without complication ([place])    type II   . Diabetic peripheral neuropathy ([place])   . Failure to thrive (0-17)   . Fracture of left lower leg   . Gout   . Hyperlipidemia   . Hypertension   . Morbid obesity ([place])   . Unstable gait     Past Surgical History:  Procedure Laterality Date  . ORIF ANKLE FRACTURE Left 02/29/2016   Procedure: OPEN REDUCTION INTERNAL FIXATION (ORIF) ANKLE FRACTURE;  Surgeon: Nicholes Stairs, MD;  [facility];  Service: Orthopedics;  Laterality: Left;  . PROSTATE SURGERY      Vitals:   11/06/19 1125  BP: (!) 156/85  Pulse: 77      Subjective Assessment - 11/06/19 1111    Subjective Had stroke a little over a month ago-feel wobbly and a little unbalanced.  Not really having weakness, just feel more unstable. Uses cane, and was not using  any device prior to CVA.  No falls since coming home from the hospital. Feel like I'm getting a little weaker since getting home from the hospital.  Prior to the stroke, "I was all over the place, doing anything I wanted."    Patient is accompained by: Family member   wife   Pertinent History diabetes, prostate cancer, CKD 3, diabetic neuropathy, morbid obesity, hypertension, hyperlipidemia, Hx of ankle fracture    Patient Stated Goals Pt's goals for therapy are to improve steadiness.    Currently in Pain? No/denies              [facility] PT Assessment - 11/06/19 1115      Assessment   Medical Diagnosis Acute ischemic L MCA CVA 10/03/2019    Referring Provider (PT) Dr. Letta Pate    Onset Date/Surgical Date 10/03/19    Hand Dominance Right      Precautions   Precautions Fall    Precaution Comments No driving      Balance Screen   Has the patient fallen in the past 6 months Yes    How many times? 1   prior to CVA-missed a step   Has the patient had a decrease in activity level  because of a fear of falling?  No    Is the patient reluctant to leave their home because of a fear of falling?  No      Home Social worker Private residence    Living Arrangements Spouse/significant other    Available Help at Discharge Family    Type of [place] to enter    Entrance Stairs-Number of Steps 1   step x 2   Entrance Stairs-Rails None    Home Layout One level    [place] - 2 wheels;Cane - single point      Prior Function   Level of Independence Independent    Leisure Has a treadmill at home and was using occasionally; managed several properties prior to CVA, went to larger stores (used motorized cart)      ROM / Strength   AROM / PROM / Strength Strength      Strength   Overall Strength Deficits    Overall Strength Comments Grossly tested 4/5 BLEs-no deficits noted R versus L      Transfers   Transfers Sit to Stand;Stand to Sit     Sit to Stand 5: Supervision;With upper extremity assist;From chair/3-in-1    Five time sit to stand comments  17.22    Stand to Sit 5: Supervision;With upper extremity assist;To chair/3-in-1      Ambulation/Gait   Ambulation/Gait Yes    Ambulation/Gait Assistance 6: Modified independent (Device/Increase time)    Ambulation Distance (Feet) 230 Feet    Assistive device Straight cane   tripod base   Gait Pattern Step-through pattern;Wide base of support;Trendelenburg    Ambulation Surface Level;Indoor    Gait velocity 14.47 sec = 2.27 ft/sec       Standardized Balance Assessment   Standardized Balance Assessment Timed Up and Go Test;Dynamic Gait Index      Dynamic Gait Index   Level Surface Moderate Impairment   10.87 sec   Change in Gait Speed Mild Impairment    Gait with Horizontal Head Turns Moderate Impairment    Gait with Vertical Head Turns Moderate Impairment    Gait and Pivot Turn Mild Impairment    Step Over Obstacle Moderate Impairment    Step Around Obstacles Mild Impairment    Steps Moderate Impairment    Total Score 11    DGI comment: Scores <19/24 indicate increased fall risk.      Timed Up and Go Test   TUG Normal TUG    Normal TUG (seconds) 19.78    TUG Comments Scores >13.5 seconds indicate increased fall risk.                      Objective measurements completed on examination: See above findings.               PT Education - 11/06/19 1627    Education Details Results of PT eval, POC    Person(s) Educated Patient;Spouse    Methods Explanation    Comprehension Verbalized understanding               PT Long Term Goals - 11/06/19 1637      PT LONG TERM GOAL #1   Title Pt will be independent with HEP for improved strength, balance, transfers, and gait.  TARGET 12/04/2019 (May be delayed due to delay in scheduling)    Time 4    Period Weeks    Status New  PT LONG TERM GOAL #2   Title Pt will improve 5x sit<>stand to less  than or equal to 14 seconds to demonstrate improved functional strength and transfer efficiency.    Time 4    Period Weeks    Status New      PT LONG TERM GOAL #3   Title Pt will improve DGI score to at least 16/24 to decrease fall risk.    Time 4    Period Weeks    Status New      PT LONG TERM GOAL #4   Title Pt will improve TUG score to less than or equal to 15 seconds for decreased fall risk.    Time 4    Period Weeks    Status New      PT LONG TERM GOAL #5   Title Pt will improve gait velocity to at least 2.62 ft/sec for improved gait effiiciency for community ambulation.    Time 4      Additional Long Term Goals   Additional Long Term Goals Yes      PT LONG TERM GOAL #6   Title Pt will verbalize understanding of fall prevention in home environment.    Time 4    Period Weeks    Status New                  Plan - 11/06/19 1630    Clinical Impression Statement Patient is an 79 y.o. male who presented with sudden onset of right arm weakness and slurred speech, 10/03/2019, with acute ischemic L MCA stroke.  He participated in inpatient therapies in [facility] 10/07/2019-10/14/2019, then was discharged home.  He presents to OPPT today with reports of being "wobbly and unsteady" and just not doing as much as prior to CVA (pt was independent prior to CVA).  He demonstrates decreased functional strength, decreased balance, decreased gait stability and gait velocity.  He is at fall risk per TUG and DGI scores, he is in limited community ambulator range based on gait velocity of 2.27 ft/sec.  He would benefit from skilled PT to address above stated deficits to decrease fall risk and to improve overall functional mobility and independence.    Personal Factors and Comorbidities Comorbidity 3+    Comorbidities PMH: diabetes, prostate cancer, CKD 3, diabetic neuropathy, morbid obesity, hypertension, hyperlipidemia    Examination-Activity Limitations Stand;Transfers;Locomotion Level     Examination-Participation Restrictions Community Activity;Driving    Stability/Clinical Decision Making Evolving/Moderate complexity    Clinical Decision Making Moderate    Rehab Potential Good    PT Frequency 2x / week    PT Duration 4 weeks   plus eval   PT Treatment/Interventions ADLs/Self Care Home Management;Gait training;Stair training;Functional mobility training;Therapeutic activities;Therapeutic exercise;Balance training;Neuromuscular re-education;Patient/family education    PT Next Visit Plan Initiate HEP and walking program for home-balance and functional strength for HEP    Consulted and Agree with Plan of Care Patient;Family member/caregiver    Family Member Consulted wife           Patient will benefit from skilled therapeutic intervention in order to improve the following deficits and impairments:  Abnormal gait, Difficulty walking, Decreased activity tolerance, Decreased endurance, Decreased balance, Decreased mobility, Decreased strength  Visit Diagnosis: Other abnormalities of gait and mobility  Unsteadiness on feet  Muscle weakness (generalized)     Problem List Patient Active Problem List   Diagnosis Date Noted  . Acute on chronic renal failure ([place]) 10/14/2019  . Acute ischemic left  MCA stroke ([place]) 10/07/2019  . Morbid obesity ([place])   . NSVT (nonsustained ventricular tachycardia) ([place])   . Diabetic peripheral neuropathy ([place])   . Acute ischemic stroke ([place]) 10/04/2019  . Hypoglycemia due to insulin 10/04/2019  . CKD (chronic kidney disease), stage III 10/04/2019  . Dehydration 10/04/2019  . Dyslipidemia 10/04/2019  . Stroke ([place]) 10/04/2019  . Ankle fracture, left 02/29/2016  . Closed left ankle fracture 02/29/2016  . Gout 02/20/2016  . Essential hypertension 02/20/2016    Tnia Anglada W. 11/06/2019, 4:42 PM  Frazier Butt., PT   [facility] [address] [place], [place],  [address] Phone: [phone]   Fax:  [phone]  Name: Argyle Gustafson. MRN: [id number] Date of Birth: 1940/06/20

## 2019-11-11 ENCOUNTER — Telehealth: Payer: Self-pay | Admitting: Student

## 2019-11-11 NOTE — Telephone Encounter (Signed)
New message   Pt wife is calling asking for a call for help about the monitor pt received.

## 2019-11-12 NOTE — Telephone Encounter (Addendum)
Returned call to patient and tried to answer all her questions. I recomneded her calling the monitor company for specific questions about the equipment. Patients wife knows to call us back if unable to get monitor applied and we will set them up to come into the office.

## 2019-11-14 ENCOUNTER — Ambulatory Visit: Payer: Medicare PPO | Attending: Internal Medicine

## 2019-11-14 DIAGNOSIS — Z23 Encounter for immunization: Secondary | ICD-10-CM

## 2019-11-14 NOTE — Progress Notes (Signed)
   [id number]  Name:  Nathan Proctor.    MRN: [id number] DOB: 1941-02-17  11/14/2019  [name] was observed post Covid-19 immunization for 15 minutes without incident. He was provided with Vaccine Information Sheet and instruction to access the V-Safe system.   [name] was instructed to call 911 with any severe reactions post vaccine: Marland Kitchen Difficulty breathing  . Swelling of face and throat  . A fast heartbeat  . A bad rash all over body  . Dizziness and weakness   Immunizations Administered    Name Date Dose VIS Date Route   Pfizer COVID-19 Vaccine 11/14/2019  9:27 AM 0.3 mL 06/24/2018 Intramuscular   Manufacturer: [employer]   Lot: [id number]   [place]: [address]-7

## 2019-11-15 ENCOUNTER — Encounter: Payer: Self-pay | Admitting: Internal Medicine

## 2019-11-15 ENCOUNTER — Encounter (INDEPENDENT_AMBULATORY_CARE_PROVIDER_SITE_OTHER): Payer: Medicare PPO

## 2019-11-15 DIAGNOSIS — I5022 Chronic systolic (congestive) heart failure: Secondary | ICD-10-CM

## 2019-11-15 DIAGNOSIS — I4729 Other ventricular tachycardia: Secondary | ICD-10-CM

## 2019-11-15 DIAGNOSIS — I472 Ventricular tachycardia: Secondary | ICD-10-CM | POA: Diagnosis not present

## 2019-11-15 DIAGNOSIS — I639 Cerebral infarction, unspecified: Secondary | ICD-10-CM

## 2019-11-17 ENCOUNTER — Other Ambulatory Visit: Payer: Self-pay

## 2019-11-17 ENCOUNTER — Ambulatory Visit: Payer: Medicare PPO | Admitting: Physical Therapy

## 2019-11-17 ENCOUNTER — Encounter: Payer: Self-pay | Admitting: Physical Therapy

## 2019-11-17 VITALS — BP 103/63 | HR 104

## 2019-11-17 DIAGNOSIS — M6281 Muscle weakness (generalized): Secondary | ICD-10-CM

## 2019-11-17 DIAGNOSIS — R2689 Other abnormalities of gait and mobility: Secondary | ICD-10-CM

## 2019-11-17 DIAGNOSIS — R278 Other lack of coordination: Secondary | ICD-10-CM

## 2019-11-17 NOTE — Patient Instructions (Signed)
WALKING Tips  Walking is the single best weight bearing exercise for most people most of the time. All that is needed is a good pair of walking shoes and a safe place to walk. Start with walking for 1 minute every 2 hours.  Walk inside your house to start with then we will progress later.   Functional Quadriceps: Sit to Stand    Sit on edge of chair, feet flat on floor. Stand upright, extending knees fully. Repeat 5_ times per set. Do 2__ sets per session. Do 3____ sessions per day.  http://orth.exer.us/734   Copyright  VHI. All rights reserved.   Long Arc Knee Extension    Sit with knee bent as much as possible. Straighten knee.  Repeat 10___ times. Do _3__ times a day. Repeat with other leg.   Copyright  VHI. All rights reserved.      Marching    Alternate lifting knees as high as is comfortable, as if marching. Repeat 10 times each leg. Do 3___ sessions per day. Note: If possible place feet on floor.  Copyright  VHI. All rights reserved.    Ischemic Stroke  An ischemic stroke (cerebrovascular accident, or CVA) is the sudden death of brain tissue that occurs when an area of the brain does not get enough oxygen. It is a medical emergency that must be treated right away. An ischemic stroke can cause permanent loss of brain function. This can cause problems with how different parts of your body function. What are the causes? This condition is caused by a decrease of oxygen supply to an area of the brain, which may be the result of:  A small blood clot (embolus) or a buildup of plaque in the blood vessels (atherosclerosis) that blocks blood flow in the brain.  An abnormal heart rhythm (atrial fibrillation).  A blocked or damaged artery in the head or neck. Sometimes the cause of stroke is not known (cryptogenic). What increases the risk? Certain factors may make you more likely to develop this condition. Some of these factors are things that you can change, such  as:  Obesity.  Smoking cigarettes.  Taking oral birth control, especially if you also use tobacco.  Physical inactivity.  Excessive alcohol use.  Use of illegal drugs, especially cocaine and methamphetamine. Other risk factors include:  High blood pressure (hypertension).  High cholesterol.  Diabetes mellitus.  Heart disease.  Being [place] American, Native American, Hispanic, or [place] Native.  Being over age 36.  Family history of stroke.  Previous history of blood clots, stroke, or transient ischemic attack (TIA).  Sickle cell disease.  Being a woman with a history of preeclampsia.  Migraine headache.  Sleep apnea.  Irregular heartbeats, such as atrial fibrillation.  Chronic inflammatory diseases, such as rheumatoid arthritis or lupus.  Blood clotting disorders (hypercoagulable state). What are the signs or symptoms? Symptoms of this condition usually develop suddenly, or you may notice them after waking up from sleep. Symptoms may include sudden:  Weakness or numbness in your face, arm, or leg, especially on one side of your body.  Trouble walking or difficulty moving your arms or legs.  Loss of balance or coordination.  Confusion.  Slurred speech (dysarthria).  Trouble speaking, understanding speech, or both (aphasia).  Vision changes--such as double vision, blurred vision, or loss of vision--in one or both eyes.  Dizziness.  Nausea and vomiting.  Severe headache with no known cause. The headache is often described as the worst headache ever experienced. If possible,  make note of the exact time that you last felt like your normal self and what time your symptoms started. Tell your health care provider. If symptoms come and go, this could be a sign of a warning stroke, or TIA. Get help right away, even if you feel better. How is this diagnosed? This condition may be diagnosed based on:  Your symptoms, your medical history, and a physical  exam.  CT scan of the brain.  MRI.  CT angiogram. This test uses a computer to take X-rays of your arteries. A dye may be injected into your blood to show the inside of your blood vessels more clearly.  MRI angiogram. This is a type of MRI that is used to evaluate the blood vessels.  Cerebral angiogram. This test uses X-rays and a dye to show the blood vessels in the brain and neck. You may need to see a health care provider who specializes in stroke care. A stroke specialist can be seen in person or through communication using telephone or television technology (telemedicine). Other tests may also be done to find the cause of the stroke, such as:  Electrocardiogram (ECG).  Continuous heart monitoring.  Echocardiogram.  Transesophageal echocardiogram (TEE).  Carotid ultrasound.  A scan of the brain circulation.  Blood tests.  Sleep study to check for sleep apnea. How is this treated? Treatment for this condition will depend on the duration, severity, and cause of your symptoms and on the area of the brain affected. It is very important to get treatment at the first sign of stroke symptoms. Some treatments work better if they are done within 3-6 hours of the onset of stroke symptoms. These initial treatments may include:  Aspirin.  Medicines to control blood pressure.  Medicine given by injection to dissolve the blood clot (thrombolytic).  Treatments given directly to the affected artery to remove or dissolve the blood clot. Other treatment options may include:  Oxygen.  IV fluids.  Medicines to thin the blood (anticoagulants or antiplatelets).  Procedures to increase blood flow. Medicines and changes to your diet may be used to help treat and manage risk factors for stroke, such as diabetes, high cholesterol, and high blood pressure. After a stroke, you may work with physical, speech, mental health, or occupational therapists to help you recover. Follow these  instructions at home: Medicines  Take over-the-counter and prescription medicines only as told by your health care provider.  If you were told to take a medicine to thin your blood, such as aspirin or an anticoagulant, take it exactly as told by your health care provider. ? Taking too much blood-thinning medicine can cause bleeding. ? If you do not take enough blood-thinning medicine, you will not have the protection that you need against another stroke and other problems.  Understand the side effects of taking anticoagulant medicine. When taking this type of medicine, make sure you: ? Hold pressure over any cuts for longer than usual. ? Tell your dentist and other health care providers that you are taking anticoagulants before you have any procedures that may cause bleeding. ? Avoid activities that may cause trauma or injury. Eating and drinking  Follow instructions from your health care provider about diet.  Eat healthy foods.  If your ability to swallow was affected by the stroke, you may need to take steps to avoid choking, such as: ? Taking small bites when eating. ? Eating foods that are soft or pureed. Safety  Follow instructions from your health care  team about physical activity.  Use a walker or cane as told by your health care provider.  Take steps to create a safe home environment in order to reduce the risk of falls. This may include: ? Having your home looked at by specialists. ? Installing grab bars in the bedroom and bathroom. ? Using safety equipment, such as raised toilets and a seat in the shower. General instructions  Do not use any tobacco products, such as cigarettes, chewing tobacco, and e-cigarettes. If you need help quitting, ask your health care provider.  Limit alcohol intake to no more than 1 drink a day for nonpregnant women and 2 drinks a day for men. One drink equals 12 oz of beer, 5 oz of wine, or 1 oz of hard liquor.  If you need help to stop  using drugs or alcohol, ask your health care provider about a referral to a program or specialist.  Maintain an active and healthy lifestyle. Get regular exercise as told by your health care provider.  Keep all follow-up visits as told by your health care provider, including visits with all specialists on your health care team. This is important. How is this prevented? Your risk of another stroke can be decreased by managing high blood pressure, high cholesterol, diabetes, heart disease, sleep apnea, and obesity. It can also be decreased by quitting smoking, limiting alcohol, and staying physically active. Your health care provider will continue to work with you on measures to prevent short-term and long-term complications of stroke. Get help right away if:   You have any symptoms of a stroke. "BE FAST" is an easy way to remember the main warning signs of a stroke: ? B - Balance. Signs are dizziness, sudden trouble walking, or loss of balance. ? E - Eyes. Signs are trouble seeing or a sudden change in vision. ? F - Face. Signs are sudden weakness or numbness of the face, or the face or eyelid drooping on one side. ? A - Arms. Signs are weakness or numbness in an arm. This happens suddenly and usually on one side of the body. ? S - Speech. Signs are sudden trouble speaking, slurred speech, or trouble understanding what people say. ? T - Time. Time to call emergency services. Write down what time symptoms started.  You have other signs of a stroke, such as: ? A sudden, severe headache with no known cause. ? Nausea or vomiting. ? Seizure.  These symptoms may represent a serious problem that is an emergency. Do not wait to see if the symptoms will go away. Get medical help right away. Call your local emergency services (911 in the [place] Do not drive yourself to the hospital. Summary  An ischemic stroke (cerebrovascular accident, or CVA) is the sudden death of brain tissue that occurs when an  area of the brain does not get enough oxygen.  Symptoms of this condition usually develop suddenly, or you may notice them after waking up from sleep.  It is very important to get treatment at the first sign of stroke symptoms. Stroke is a medical emergency that must be treated right away. This information is not intended to replace advice given to you by your health care provider. Make sure you discuss any questions you have with your health care provider. Document Revised: 01/03/2018 Document Reviewed: 07/13/2015 Elsevier Patient Education  [place].
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
## 2019-11-19 ENCOUNTER — Encounter: Payer: Medicare PPO | Admitting: Physical Medicine & Rehabilitation

## 2019-11-20 ENCOUNTER — Telehealth: Payer: Self-pay

## 2019-11-20 ENCOUNTER — Encounter: Payer: Medicare PPO | Attending: Registered Nurse | Admitting: Physical Medicine & Rehabilitation

## 2019-11-20 ENCOUNTER — Other Ambulatory Visit: Payer: Self-pay

## 2019-11-20 ENCOUNTER — Ambulatory Visit: Payer: Medicare PPO

## 2019-11-20 DIAGNOSIS — R2681 Unsteadiness on feet: Secondary | ICD-10-CM

## 2019-11-20 DIAGNOSIS — M6281 Muscle weakness (generalized): Secondary | ICD-10-CM

## 2019-11-20 DIAGNOSIS — I63512 Cerebral infarction due to unspecified occlusion or stenosis of left middle cerebral artery: Secondary | ICD-10-CM | POA: Insufficient documentation

## 2019-11-20 DIAGNOSIS — I10 Essential (primary) hypertension: Secondary | ICD-10-CM | POA: Insufficient documentation

## 2019-11-20 DIAGNOSIS — R2689 Other abnormalities of gait and mobility: Secondary | ICD-10-CM | POA: Diagnosis not present

## 2019-11-20 DIAGNOSIS — E785 Hyperlipidemia, unspecified: Secondary | ICD-10-CM | POA: Insufficient documentation

## 2019-11-20 DIAGNOSIS — R278 Other lack of coordination: Secondary | ICD-10-CM

## 2019-11-20 NOTE — Telephone Encounter (Signed)
Left message for patient to call back.  Patient has a history of NSVT. He is on Coreg 25 mg daily and amlodipine 10 mg daily.

## 2019-11-20 NOTE — Telephone Encounter (Signed)
Spoke with [place] from [facility] [place] who reports that the patient had an 8 beat run of V Tach this afternoon around 1pm CT with HR at 163 bpm. Patient's underlying rhythm is sinus with average HR of 83 bpm.
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
## 2019-11-23 ENCOUNTER — Other Ambulatory Visit: Payer: Self-pay

## 2019-11-23 ENCOUNTER — Ambulatory Visit: Payer: Medicare PPO | Admitting: Occupational Therapy

## 2019-11-23 DIAGNOSIS — R2689 Other abnormalities of gait and mobility: Secondary | ICD-10-CM | POA: Diagnosis not present

## 2019-11-23 DIAGNOSIS — R278 Other lack of coordination: Secondary | ICD-10-CM

## 2019-11-23 NOTE — Telephone Encounter (Signed)
[employer] faxed over critical cardiac reports of this pts monitor from days 6 and 7, which were on 7/23 and 7/24.   7/23 at 12:50 pm CST was an auto-triggered event on this pt.  Recording showed sinus rhythm, V-TACH (8 beats) w/artifact. Pts HR-163 bpm.  7/24 at 6:16 pm CST was an auto-triggered event on this pt.  Recording showed V-Tach (8 beats), sinus rhythm with a HR- 175 bpm.  7/24 at 11:56 pm CST was an auto-triggered event on this pt.  Recording showed sinus rhythm w/run of V-tach (8 beats), and HR-176 bpm.  Pt was contacted on this event and endorsed to on-call that he was asymptomatic at this time, and was laying in bed.  I tried calling the pt as well to follow-up with him and inquire on his other events, and to see if he was having any other symptoms during that time.  Pt did not answer, so left him a detailed message to call the office back and request to speak with a triage nurse, to further discuss this.  Will go ahead and show the pts Primary Cardiologist, Dr. Harrington Challenger, his recordings, for further review and interpretation.   Pt has an appt scheduled with Dr. Harrington Challenger for 8/16 at [facility].  Pt was ordered this event monitor for outpatient to place, at discharge from the hospital for cryptogenic stroke and nSVT.    Dr. Harrington Challenger has these strips in her possession. She will review and advise on further management.  Will send her this message for her reference.  Will also CC her primary RN on this as well.

## 2019-11-23 NOTE — Therapy (Signed)
[facility] [address] [place], [place], [address] Phone: [phone]   Fax:  [phone]  Occupational Therapy Treatment  Patient Details  Name: Nathan Proctor. MRN: [id number] Date of Birth: [birth year] Referring Provider (OT): Dr. Letta Pate   Encounter Date: 11/23/2019   OT End of Session - 11/23/19 1136    Visit Number 2    Number of Visits 9    Date for OT Re-Evaluation 12/06/19    Authorization Type [employer] [facility]    Authorization Time Period 90 days    OT Start Time 1105    OT Stop Time 1143    OT Time Calculation (min) 38 min    Activity Tolerance Patient tolerated treatment well    Behavior During Therapy WFL for tasks assessed/performed           Past Medical History:  Diagnosis Date  . Anemia of chronic disease   . Ankle fracture 02/15/2016  . Cancer [facility])    Prostate  . Chronic constipation   . Chronic kidney disease    stage III  . Diabetes mellitus without complication ([place])    type II   . Diabetic peripheral neuropathy ([place])   . Failure to thrive (0-17)   . Fracture of left lower leg   . Gout   . Hyperlipidemia   . Hypertension   . Morbid obesity ([place])   . Unstable gait     Past Surgical History:  Procedure Laterality Date  . ORIF ANKLE FRACTURE Left 02/29/2016   Procedure: OPEN REDUCTION INTERNAL FIXATION (ORIF) ANKLE FRACTURE;  Surgeon: Nicholes Stairs, MD;  [facility];  Service: Orthopedics;  Laterality: Left;  . PROSTATE SURGERY      There were no vitals filed for this visit.   Subjective Assessment - 11/23/19 1109    Subjective  My coordination is worse Rt side, but Lt side seems to be weak too    Patient is accompanied by: Family member   WIFE   Patient Stated Goals to be close to independent    Currently in Pain? No/denies           Pt issued coordination HEP and performed/reviewed  - see pt instructions for details.  UBE x 10 min, level 3 for UE  strength/endurance                     OT Education - 11/23/19 1117    Education Details coordination HEP    Person(s) Educated Patient    Methods Explanation;Demonstration;Handout    Comprehension Verbalized understanding;Returned demonstration               OT Long Term Goals - 11/06/19 1339      OT LONG TERM GOAL #1   Title I with HEP coordination,and proximal UE strength    Time 4    Period Weeks    Status New      OT LONG TERM GOAL #2   Title Pt will demonstrate increased RUE fine motor coordination for ADLs as eveidenced by decreasing 9 hole peg test score to 35 secs or less.    Baseline RUe39.97, LUE 28.94    Time 4    Period Weeks    Status New      OT LONG TERM GOAL #3   Title Pt will perfrom a physical and cognitve task simultaneously with 90% or better accuracy in prep for return to driving and grocery shopping.    Time  [place] - 11/23/19 1138    Clinical Impression Statement Pt progressing with coordination and endurance    Occupational performance deficits (Please refer to evaluation for details): ADL's;IADL's;Leisure;Social Participation    Body Structure / Function / Physical Skills ADL;Gait;Strength;GMC;Dexterity;Balance;UE functional use;ROM;IADL;Endurance;Mobility;Flexibility;Coordination;Decreased knowledge of precautions;FMC;Decreased knowledge of use of DME    Rehab Potential Good    OT Frequency 2x / week    OT Duration 4 weeks    OT Treatment/Interventions Self-care/ADL training;Moist Heat;Fluidtherapy;DME and/or AE instruction;Therapeutic activities;Aquatic Therapy;Gait Training;Ultrasound;Therapeutic exercise    Plan address light proximal strenghtening    Consulted and Agree with Plan of Care Patient;Family member/caregiver           Patient will benefit from skilled therapeutic intervention in order to improve the following deficits and impairments:   Body Structure /  Function / Physical Skills: ADL, Gait, Strength, GMC, Dexterity, Balance, UE functional use, ROM, IADL, Endurance, Mobility, Flexibility, Coordination, Decreased knowledge of precautions, FMC, Decreased knowledge of use of DME       Visit Diagnosis: Other lack of coordination    Problem List Patient Active Problem List   Diagnosis Date Noted  . Acute on chronic renal failure ([place]) 10/14/2019  . Acute ischemic left MCA stroke ([place]) 10/07/2019  . Morbid obesity ([place])   . NSVT (nonsustained ventricular tachycardia) ([place])   . Diabetic peripheral neuropathy ([place])   . Acute ischemic stroke ([place]) 10/04/2019  . Hypoglycemia due to insulin 10/04/2019  . CKD (chronic kidney disease), stage III 10/04/2019  . Dehydration 10/04/2019  . Dyslipidemia 10/04/2019  . Stroke ([place]) 10/04/2019  . Ankle fracture, left 02/29/2016  . Closed left ankle fracture 02/29/2016  . Gout 02/20/2016  . Essential hypertension 02/20/2016    Carey Bullocks, OTR/L 11/23/2019, 11:41 AM  [facility] [address] [place], [place], [address] Phone: [phone]   Fax:  [phone]  Name: Nathan Proctor. MRN: [id number] Date of Birth: 1941/01/14

## 2019-11-23 NOTE — Patient Instructions (Signed)
  Coordination Activities  Perform the following activities for 10-15 minutes 2 times per day with right hand(s).   Rotate ball in fingertips (clockwise and counter-clockwise).  Toss ball in air and catch with the same hand.  Flip cards 1 at a time as fast as you can.  Deal cards with your thumb (Hold deck in hand and push card off top with thumb).  Rotate 1 card in hand (clockwise and counter-clockwise).  Pick up coins one at a time until you get 5 in your hand, then move coins from palm to fingertips to stack one at a time.  Practice writing.

## 2019-11-25 ENCOUNTER — Telehealth: Payer: Self-pay

## 2019-11-25 NOTE — Telephone Encounter (Addendum)
Received strip for Day 10 of 30 from [employer].   Pt had 11 beat run of VT on 11/24/19 at 9:46 pm. Max rate 156.   LM for the pt to call to assess his symptoms.   Will review with the DOD... Dr. Lovena Le.     Pt has an appt scheduled with Dr. Harrington Challenger for 8/16 at [facility].  Pt was ordered this event monitor for outpatient to place, at discharge from the hospital for cryptogenic stroke and nSVT.   Addendum: Spoke with Dr. Lovena Le and he says okay to have Dr. Harrington Challenger review at her next office day 11/27/19.   Strips placed in Dr. Harrington Challenger' mailbox.

## 2019-11-26 ENCOUNTER — Telehealth: Payer: Self-pay | Admitting: Internal Medicine

## 2019-11-26 ENCOUNTER — Telehealth: Payer: Self-pay | Admitting: Physician Assistant

## 2019-11-26 DIAGNOSIS — I5022 Chronic systolic (congestive) heart failure: Secondary | ICD-10-CM

## 2019-11-26 DIAGNOSIS — I472 Ventricular tachycardia: Secondary | ICD-10-CM

## 2019-11-26 DIAGNOSIS — I4729 Other ventricular tachycardia: Secondary | ICD-10-CM

## 2019-11-26 NOTE — Telephone Encounter (Signed)
Paged by Janett Billow of [facility] [phone]) regarding 7 seconds run of NSVT at HR of 167 bpm. This occurred at 5:15AM Central time which is 6:15AM EST. I am unable to reach the patient, but did leave him a message. [employer] is currently trying to reach the patient as well. I asked the [employer]ice company to send his strips to both Dr. Harrington Challenger and [facility].   Note, patient is on heart monitor due to cryptogenic stroke and NSVT last seen by Dr. Harrington Challenger in the hospital on 6/13. Since wearing the heart monitor, patient has had multiple reports of NSVT, mostly asymptomatic. [facility] last contacted cardiology around 2 AM this morning and spoke with fellow overnight. Dr. Lovena Le DOD reviewed the previous strips as well and recommended keep Dr. Harrington Challenger' follow up. However this episode appears to be longer than the previous episodes. Will review the strips before final recommendation.

## 2019-11-26 NOTE — Telephone Encounter (Signed)
Monitor strips given to Dr. Harrington Challenger for review.

## 2019-11-26 NOTE — Telephone Encounter (Signed)
Received 3 monitor strips on pt.  1:10A CST- Sinus rhythm with an 11 beat run of VT.  HR got up to 180bpm.  Comments say pt was asymptomatic and at home sleeping.  Auto triggered.  5:15A CST- Sinus rhythm with a 7 second run of VT.  HR got up to 164bpm.  Auto triggered.  6:32A CST- Sinus rhythm with PACs.  HR 73.  Pt triggered and symptom mentioned is passed out.  Attempted to contact pt.  Left message to call back.

## 2019-11-26 NOTE — Telephone Encounter (Signed)
Call received about one run 11 beat run of VT; rate 180 per [employer] team (unable to review strip).  Unable to reach patient.  11/24/19 patient had one episodes of 11 beat run; per chart review patient was asymptomatic at this time and was eating dinner.  Will continue to monitor; [employer] team will continue to reach out to pt.  If patient is symptomatic; will evaluate different plan of care overnight.  Rudean Haskell MD

## 2019-11-27 ENCOUNTER — Telehealth: Payer: Self-pay | Admitting: Student

## 2019-11-27 NOTE — Telephone Encounter (Signed)
reviewed

## 2019-11-27 NOTE — Telephone Encounter (Signed)
Pt recently admitted with CVA    Had NSVT in hosp   Also mild/mod LV dysfunction  NO CP WIth recent stroke, no invasive evaluation planned  For now would sched for lexiscan myovue to r/o significant asymptomatic ischemia Please confirm that pt is taking coreg.

## 2019-11-27 NOTE — Telephone Encounter (Signed)
° °  Received page from [place] at [facility] about abnormal EKG. Called and spoke with Nathan Proctor. Nathan Proctor had a 7 second run of VT with rates of 165 bpm earlier this morning around 2:30am Central Time (3:30am EST). Baseline rhythm is normal sinus rhythm with rates in the 80's. [employer] was able to call the Nathan Proctor and he reports he was asymptomatic at the time and was sleeping.  Reviewed chart. Nathan Proctor wearing monitor due to cryptogenic stroke and NSVT. Since wearing the monitor, Nathan Proctor has had multiple reports of NSVT, mostly asymptomatic. He had a similar 7 second runs of NSVT yesterday morning. Strips were given to Dr. Harrington Challenger for her to review.   Of note, Nathan Proctor was discharged on Coreg 25mg  twice daily after stroke. However, it looks like he reported not taking this on 11/06/2019. I tried to call Nathan Proctor to confirm that he was asymptomatic and clarify whether or not he is still taking Coreg but was unable to reach Nathan Proctor. Left Nathan Proctor a message asking him to call make if he is having any symptoms.  Will route message to Dr. Harrington Challenger so that she is aware Nathan Proctor had another 7 second run of VT.  Darreld Mclean, PA-C 11/27/2019 6:56 AM

## 2019-11-27 NOTE — Telephone Encounter (Signed)
Reviewed

## 2019-11-30 NOTE — Telephone Encounter (Signed)
Called patient and confirmed he is taking carvedilol 25 mg bid.  Informed Dr. Harrington Challenger ordered nuclear stress test and that he will be called to schedule this.  Instructions provided over the phone.

## 2019-11-30 NOTE — Telephone Encounter (Signed)
Day 14 Critical event reviewed by Dr. Ky Barban. No change in plan.

## 2019-11-30 NOTE — Addendum Note (Signed)
Addended by: Rodman Key on: 11/30/2019 09:52 AM   Modules accepted: Orders

## 2019-11-30 NOTE — Telephone Encounter (Signed)
Day 14  - Critical report received from 11/28/19 at 5:32 pm Auto triggered event Sinus rhythm w VT 13 seconds rate 195.

## 2019-12-01 ENCOUNTER — Other Ambulatory Visit: Payer: Self-pay

## 2019-12-01 ENCOUNTER — Ambulatory Visit: Payer: Medicare PPO | Attending: Physical Medicine and Rehabilitation | Admitting: Physical Therapy

## 2019-12-01 ENCOUNTER — Encounter: Payer: Self-pay | Admitting: Physical Therapy

## 2019-12-01 ENCOUNTER — Ambulatory Visit: Payer: Medicare PPO | Admitting: Occupational Therapy

## 2019-12-01 VITALS — BP 158/84

## 2019-12-01 DIAGNOSIS — M6281 Muscle weakness (generalized): Secondary | ICD-10-CM | POA: Diagnosis present

## 2019-12-01 DIAGNOSIS — R2689 Other abnormalities of gait and mobility: Secondary | ICD-10-CM | POA: Diagnosis present

## 2019-12-01 DIAGNOSIS — R2681 Unsteadiness on feet: Secondary | ICD-10-CM | POA: Insufficient documentation

## 2019-12-01 DIAGNOSIS — R278 Other lack of coordination: Secondary | ICD-10-CM | POA: Diagnosis present

## 2019-12-02 NOTE — Therapy (Signed)
[facility] [address] [place], [place], [address] Phone: [phone]   Fax:  [phone]  Physical Therapy Treatment  Patient Details  Name: Nathan Proctor. MRN: [id number] Date of Birth: December 15, 1940 Referring Provider (PT): Dr. Letta Pate   Encounter Date: 12/01/2019   PT End of Session - 12/01/19 1236    Visit Number 4    Number of Visits 9    Date for PT Re-Evaluation 16/55/37   60 day cert for 4-wk POC (pt did not schedule at eval day, so may be delayed in scheduling)   Authorization Type [employer]    Progress Note Due on Visit 10    PT Start Time 1233    PT Stop Time 1315    PT Time Calculation (min) 42 min    Equipment Utilized During Treatment Gait belt    Activity Tolerance Patient tolerated treatment well;Patient limited by fatigue   fatigued at end of session; O2 sats 97%, HR 84 bpm   Behavior During Therapy WFL for tasks assessed/performed           Past Medical History:  Diagnosis Date  . Anemia of chronic disease   . Ankle fracture 02/15/2016  . Cancer [facility])    Prostate  . Chronic constipation   . Chronic kidney disease    stage III  . Diabetes mellitus without complication ([place])    type II   . Diabetic peripheral neuropathy ([place])   . Failure to thrive (0-17)   . Fracture of left lower leg   . Gout   . Hyperlipidemia   . Hypertension   . Morbid obesity ([place])   . Unstable gait     Past Surgical History:  Procedure Laterality Date  . ORIF ANKLE FRACTURE Left 02/29/2016   Procedure: OPEN REDUCTION INTERNAL FIXATION (ORIF) ANKLE FRACTURE;  Surgeon: Nicholes Stairs, MD;  Location: [facility];  Service: Orthopedics;  Laterality: Left;  . PROSTATE SURGERY      Vitals:   12/01/19 1234 12/01/19 1255 12/01/19 1309  BP: (!) 169/87 (!) 173/93 (!) 158/84     Subjective Assessment - 12/01/19 1234    Subjective Reports feeling a little "woozy" today. Reports this is all the time  since the stroke. "Flairs up" with sudden movements. Denies any falls.    Patient is accompained by: Family member    Pertinent History diabetes, prostate cancer, CKD 3, diabetic neuropathy, morbid obesity, hypertension, hyperlipidemia, Hx of ankle fracture    Patient Stated Goals Pt's goals for therapy are to improve steadiness.    Currently in Pain? No/denies    Pain Score 0-No pain                 [facility] Adult PT Treatment/Exercise - 12/01/19 1241      Transfers   Transfers Sit to Stand;Stand to Sit    Sit to Stand 5: Supervision;With upper extremity assist;From chair/3-in-1    Stand to Sit 5: Supervision;With upper extremity assist;To chair/3-in-1      Ambulation/Gait   Ambulation/Gait Yes    Ambulation/Gait Assistance 5: Supervision;4: Min guard    Ambulation/Gait Assistance Details use of cane to enter/exit session. Use of rollator in session to continue to trial as a potential device for longer community distances. cues needed for hand placement on brakes and use of brakes on uneven surfaces outdoors. cues to stay closer to rollator needed at times.     Ambulation Distance (Feet) 230 Feet   x1, 300  x1 with rollator   Assistive device Straight cane;Rollator    Gait Pattern Step-through pattern;Wide base of support;Trendelenburg    Ambulation Surface Level;Indoor;Unlevel;Outdoor;Paved      Knee/Hip Exercises: Seated   Long Arc Quad AROM;Strengthening;Both;2 sets;10 reps;Limitations    Long Arc Quad Weight 2 lbs.    Long [employer] Limitations cues for controlled movements    Other Seated Knee/Hip Exercises with 2# ankle weights- heel<>toe raises for 10 reps,     Marching AROM;Strengthening;Both;2 sets;10 reps;Limitations;Weights    Marching Limitations cues on form and to stay tall with posture     Marching Weights 2 lbs.                    PT Long Term Goals - 11/06/19 1637      PT LONG TERM GOAL #1   Title Pt will be independent with HEP for improved  strength, balance, transfers, and gait.  TARGET 12/04/2019 (May be delayed due to delay in scheduling)    Time 4    Period Weeks    Status New      PT LONG TERM GOAL #2   Title Pt will improve 5x sit<>stand to less than or equal to 14 seconds to demonstrate improved functional strength and transfer efficiency.    Time 4    Period Weeks    Status New      PT LONG TERM GOAL #3   Title Pt will improve DGI score to at least 16/24 to decrease fall risk.    Time 4    Period Weeks    Status New      PT LONG TERM GOAL #4   Title Pt will improve TUG score to less than or equal to 15 seconds for decreased fall risk.    Time 4    Period Weeks    Status New      PT LONG TERM GOAL #5   Title Pt will improve gait velocity to at least 2.62 ft/sec for improved gait effiiciency for community ambulation.    Time 4      Additional Long Term Goals   Additional Long Term Goals Yes      PT LONG TERM GOAL #6   Title Pt will verbalize understanding of fall prevention in home environment.    Time 4    Period Weeks    Status New                 Plan - 12/01/19 1236    Clinical Impression Statement Today's skilled session continued to address use of rollator with gait for potential use in  community. Cues needed for general safety with use of rollator. Pt's BP elevated after second set of gait with rollator. Transitioned to working on seated LE strengthening with a decrease in BP noted at end of session. The pt is progressing toward goals and should benefit from continued PT to progress toward unmet goals.    Personal Factors and Comorbidities Comorbidity 3+    Comorbidities PMH: diabetes, prostate cancer, CKD 3, diabetic neuropathy, morbid obesity, hypertension, hyperlipidemia    Examination-Activity Limitations Stand;Transfers;Locomotion Level    Examination-Participation Restrictions Community Activity;Driving    Stability/Clinical Decision Making Evolving/Moderate complexity    Rehab  Potential Good    PT Frequency 2x / week    PT Duration 4 weeks   plus eval   PT Treatment/Interventions ADLs/Self Care Home Management;Gait training;Stair training;Functional mobility training;Therapeutic activities;Therapeutic exercise;Balance training;Neuromuscular re-education;Patient/family education    PT  Next Visit Plan continue to work on balance and functional strength.  Progress HEP as needed.    Consulted and Agree with Plan of Care Patient;Family member/caregiver    Family Member Consulted wife           Patient will benefit from skilled therapeutic intervention in order to improve the following deficits and impairments:  Abnormal gait, Difficulty walking, Decreased activity tolerance, Decreased endurance, Decreased balance, Decreased mobility, Decreased strength  Visit Diagnosis: Other abnormalities of gait and mobility  Muscle weakness (generalized)  Unsteadiness on feet     Problem List Patient Active Problem List   Diagnosis Date Noted  . Acute on chronic renal failure ([place]) 10/14/2019  . Acute ischemic left MCA stroke ([place]) 10/07/2019  . Morbid obesity ([place])   . NSVT (nonsustained ventricular tachycardia) ([place])   . Diabetic peripheral neuropathy ([place])   . Acute ischemic stroke ([place]) 10/04/2019  . Hypoglycemia due to insulin 10/04/2019  . CKD (chronic kidney disease), stage III 10/04/2019  . Dehydration 10/04/2019  . Dyslipidemia 10/04/2019  . Stroke ([place]) 10/04/2019  . Ankle fracture, left 02/29/2016  . Closed left ankle fracture 02/29/2016  . Gout 02/20/2016  . Essential hypertension 02/20/2016    Willow Ora, PTA, [facility] [address] [place], [place] [address] [phone] 12/02/19, 2:24 PM   Name: Jaecob Lowden. MRN: [id number] Date of Birth: October 28, 1940

## 2019-12-03 ENCOUNTER — Encounter: Payer: Self-pay | Admitting: Physical Medicine & Rehabilitation

## 2019-12-03 ENCOUNTER — Encounter: Payer: Medicare PPO | Attending: Registered Nurse | Admitting: Physical Medicine & Rehabilitation

## 2019-12-03 ENCOUNTER — Telehealth: Payer: Self-pay | Admitting: Internal Medicine

## 2019-12-03 ENCOUNTER — Other Ambulatory Visit: Payer: Self-pay

## 2019-12-03 VITALS — BP 152/76 | HR 78 | Temp 98.9°F | Ht 70.0 in | Wt 301.2 lb

## 2019-12-03 DIAGNOSIS — I63512 Cerebral infarction due to unspecified occlusion or stenosis of left middle cerebral artery: Secondary | ICD-10-CM

## 2019-12-03 DIAGNOSIS — I10 Essential (primary) hypertension: Secondary | ICD-10-CM | POA: Insufficient documentation

## 2019-12-03 DIAGNOSIS — E785 Hyperlipidemia, unspecified: Secondary | ICD-10-CM | POA: Diagnosis present

## 2019-12-03 NOTE — Telephone Encounter (Signed)
Called by [facility] Team.  8 beat run of NSVT; patient asymptomatic.  Similar to prior runs noted per team.  Will monitor unless situation changes.  Rudean Haskell MD

## 2019-12-03 NOTE — Telephone Encounter (Signed)
Received fax for transmission.   Dr. Burt Knack reviewed, no changes

## 2019-12-03 NOTE — Patient Instructions (Signed)
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
## 2019-12-04 ENCOUNTER — Encounter: Payer: Self-pay | Admitting: Physical Therapy

## 2019-12-04 ENCOUNTER — Ambulatory Visit: Payer: Medicare PPO | Admitting: Physical Therapy

## 2019-12-04 ENCOUNTER — Telehealth: Payer: Self-pay

## 2019-12-04 DIAGNOSIS — R2689 Other abnormalities of gait and mobility: Secondary | ICD-10-CM

## 2019-12-04 DIAGNOSIS — R2681 Unsteadiness on feet: Secondary | ICD-10-CM

## 2019-12-04 DIAGNOSIS — M6281 Muscle weakness (generalized): Secondary | ICD-10-CM

## 2019-12-04 NOTE — Patient Instructions (Addendum)

## 2019-12-04 NOTE — Telephone Encounter (Signed)
Received call from [employer]. Patient had first documented episode of Afib at a rate of ~ 100 for 18 seconds. Returned to [facility].   Will notify Dr. Harrington Challenger.   Alleta Avery K. Marletta Lor, MD

## 2019-12-04 NOTE — Telephone Encounter (Signed)
Received Monitor alert for 12/04/2019 at 2:37 02 for AFIB.  Attempted phone call to pt's mobile phone number and received message mailbox is full and cannot accept anymore messages.  Attempted phone call to pt's home phone and  Left voicemail message to contact RN at [phone].  Alert taken to Dr Marlou Porch, DOD for review and sign.

## 2019-12-04 NOTE — Telephone Encounter (Signed)
Please get strip for review/recording

## 2019-12-04 NOTE — Therapy (Signed)
[facility] [address] [place], [place], [address] Phone: [phone]   Fax:  [phone]  Physical Therapy Treatment  Patient Details  Name: Nathan Proctor. MRN: [id number] Date of Birth: [birth year] Referring Provider (PT): Dr. Letta Pate   Encounter Date: 12/04/2019   PT End of Session - 12/04/19 1108    Visit Number 5    Number of Visits 9    Date for PT Re-Evaluation 88/50/27   60 day cert for 4-wk POC (pt did not schedule at eval day, so may be delayed in scheduling)   Authorization Type [employer]    Progress Note Due on Visit 10    PT Start Time 1103    PT Stop Time 1142    PT Time Calculation (min) 39 min    Equipment Utilized During Treatment Gait belt    Activity Tolerance Patient tolerated treatment well;Patient limited by fatigue   fatigued at end of session; O2 sats 97%, HR 84 bpm   Behavior During Therapy WFL for tasks assessed/performed           Past Medical History:  Diagnosis Date  . Anemia of chronic disease   . Ankle fracture 02/15/2016  . Cancer [facility])    Prostate  . Chronic constipation   . Chronic kidney disease    stage III  . Diabetes mellitus without complication ([place])    type II   . Diabetic peripheral neuropathy ([place])   . Failure to thrive (0-17)   . Fracture of left lower leg   . Gout   . Hyperlipidemia   . Hypertension   . Morbid obesity ([place])   . Unstable gait     Past Surgical History:  Procedure Laterality Date  . ORIF ANKLE FRACTURE Left 02/29/2016   Procedure: OPEN REDUCTION INTERNAL FIXATION (ORIF) ANKLE FRACTURE;  Surgeon: Nicholes Stairs, MD;  Location: [facility];  Service: Orthopedics;  Laterality: Left;  . PROSTATE SURGERY      There were no vitals filed for this visit.   Subjective Assessment - 12/04/19 1105    Subjective No new complaints. No falls or pain to report. Saw Dr. Letta Pate yesterday and was released from him. Was told he could  return for a should injection if needed due to continued pain in shoulder.    Patient is accompained by: Family member   spouse   Pertinent History diabetes, prostate cancer, CKD 3, diabetic neuropathy, morbid obesity, hypertension, hyperlipidemia, Hx of ankle fracture    Patient Stated Goals Pt's goals for therapy are to improve steadiness.    Currently in Pain? Yes    Pain Location Shoulder    Pain Orientation Left    Pain Descriptors / Indicators Aching;Sore;Other (Comment)   "stiff"   Pain Type Acute pain    Pain Onset More than a month ago    Pain Frequency Intermittent    Aggravating Factors  immobility, poor positioning    Pain Relieving Factors movements/stretching              [facility] PT Assessment - 12/04/19 1111      Dynamic Gait Index   Level Surface Mild Impairment    slow, mildly antalgic   Change in Gait Speed Normal    Gait with Horizontal Head Turns Mild Impairment   decr speed   Gait with Vertical Head Turns Mild Impairment   decr speed   Gait and Pivot Turn Normal    Step Over Obstacle Mild Impairment  Step Around Obstacles Normal    Steps Mild Impairment    Total Score 19      Timed Up and Go Test   TUG Normal TUG    Normal TUG (seconds) 12.41   no AD   TUG Comments Scores >13.5 seconds indicate increased fall risk.                 [facility] Adult PT Treatment/Exercise - 12/04/19 1111      Transfers   Transfers Sit to Stand;Stand to Sit    Sit to Stand 5: Supervision;With upper extremity assist;From chair/3-in-1    Five time sit to stand comments  14.50 sec's with no UE support from standard height surface    Stand to Sit 5: Supervision;With upper extremity assist;To chair/3-in-1      Ambulation/Gait   Ambulation/Gait Yes    Ambulation/Gait Assistance 5: Supervision    Ambulation/Gait Assistance Details use of cane to enter/exit gym. no device used with session    Ambulation Distance (Feet) --   around gym with session   Assistive device  Straight cane    Gait Pattern Step-through pattern;Wide base of support;Trendelenburg    Ambulation Surface Level;Indoor    Gait velocity 11.0 sec's= 2.98 ft/sec no AD      Self-Care   Self-Care Other Self-Care Comments    Other Self-Care Comments  edcuated pt and spouse on fall prevention strategies for the home. written information provided.                   PT Education - 12/04/19 1713    Education Details fall prevention strategies for the home    Person(s) Educated Patient;Spouse    Methods Explanation;Demonstration;Handout    Comprehension Verbalized understanding;Returned demonstration               PT Long Term Goals - 12/04/19 1109      PT LONG TERM GOAL #1   Title Pt will be independent with HEP for improved strength, balance, transfers, and gait.  TARGET 12/04/2019 (May be delayed due to delay in scheduling)    Baseline 12/04/19: met with current program which he reports is still challenging.    Status Achieved      PT LONG TERM GOAL #2   Title Pt will improve 5x sit<>stand to less than or equal to 14 seconds to demonstrate improved functional strength and transfer efficiency.    Baseline 12/04/19: 14.50 sec's no UE support from standard height surface, improved just not to goal level    Time --    Period --    Status Partially Met      PT LONG TERM GOAL #3   Title Pt will improve DGI score to at least 16/24 to decrease fall risk.    Baseline 12/04/19: 19/24 scored today    Time --    Period --    Status Achieved      PT LONG TERM GOAL #4   Title Pt will improve TUG score to less than or equal to 15 seconds for decreased fall risk.    Baseline 12/04/19: 12.41 sec's no AD    Time --    Period --    Status Achieved      PT LONG TERM GOAL #5   Title Pt will improve gait velocity to at least 2.62 ft/sec for improved gait effiiciency for community ambulation.    Baseline 12/04/19: 2.98 ft/sed no AD    Time 4    Status Achieved  PT LONG TERM GOAL #6    Title Pt will verbalize understanding of fall prevention in home environment.    Baseline 12/04/19: met in session today with spouse present as well    Time --    Period --    Status Achieved                 Plan - 12/04/19 1108    Clinical Impression Statement Today's skilled session focused on progress toward goals with pt meeting his 10 meter gait speed goal with score of 2.98 ft/sec no AD, his DGI goal with score of 19/24 which stil places him in a fall risk category. The pt did not meet his 5 time sit to stand goal, however did demo improvement in the time. The pt also met his TUG goal with a score of 12.41 sec's, with no AD.The pt's next appointment is with the primary PT who will discuss the results of today with the pt to determine recert vs discharge at that time.    Personal Factors and Comorbidities Comorbidity 3+    Comorbidities PMH: diabetes, prostate cancer, CKD 3, diabetic neuropathy, morbid obesity, hypertension, hyperlipidemia    Examination-Activity Limitations Stand;Transfers;Locomotion Level    Examination-Participation Restrictions Community Activity;Driving    Stability/Clinical Decision Making Evolving/Moderate complexity    Rehab Potential Good    PT Frequency 2x / week    PT Duration 4 weeks   plus eval   PT Treatment/Interventions ADLs/Self Care Home Management;Gait training;Stair training;Functional mobility training;Therapeutic activities;Therapeutic exercise;Balance training;Neuromuscular re-education;Patient/family education    PT Next Visit Plan ? recert vs discharge (see's primary PT at next visit); continue to work on balance and functional strength.  Progress HEP as needed.    Consulted and Agree with Plan of Care Patient;Family member/caregiver    Family Member Consulted wife           Patient will benefit from skilled therapeutic intervention in order to improve the following deficits and impairments:  Abnormal gait, Difficulty walking, Decreased  activity tolerance, Decreased endurance, Decreased balance, Decreased mobility, Decreased strength  Visit Diagnosis: Other abnormalities of gait and mobility  Muscle weakness (generalized)  Unsteadiness on feet     Problem List Patient Active Problem List   Diagnosis Date Noted  . Acute on chronic renal failure ([place]) 10/14/2019  . Acute ischemic left MCA stroke ([place]) 10/07/2019  . Morbid obesity ([place])   . NSVT (nonsustained ventricular tachycardia) ([place])   . Diabetic peripheral neuropathy ([place])   . Acute ischemic stroke ([place]) 10/04/2019  . Hypoglycemia due to insulin 10/04/2019  . CKD (chronic kidney disease), stage III 10/04/2019  . Dehydration 10/04/2019  . Dyslipidemia 10/04/2019  . Stroke ([place]) 10/04/2019  . Ankle fracture, left 02/29/2016  . Closed left ankle fracture 02/29/2016  . Gout 02/20/2016  . Essential hypertension 02/20/2016    Willow Ora, PTA, [facility] [address] [place] [employer], [place] [address] [phone] 12/04/19, 5:14 PM   Name: Nathan Proctor. MRN: [id number] Date of Birth: [birth year]

## 2019-12-07 NOTE — Telephone Encounter (Signed)
Pt with monitor for cryptogenic stroke and NSVT  Strip reviewed by DOD and had Dr. Rayann Heman review for potential afib.  Per Dr. Rayann Heman- "I do not see afib.  Agree with junctional rhythm"  Will have strips scanned to Pt's chart

## 2019-12-08 ENCOUNTER — Ambulatory Visit: Payer: Medicare PPO | Attending: Internal Medicine

## 2019-12-08 ENCOUNTER — Telehealth: Payer: Self-pay

## 2019-12-08 ENCOUNTER — Encounter: Payer: Self-pay | Admitting: Occupational Therapy

## 2019-12-08 ENCOUNTER — Emergency Department (HOSPITAL_COMMUNITY)
Admission: EM | Admit: 2019-12-08 | Discharge: 2019-12-09 | Disposition: A | Discharge status: Home / Self Care | Payer: Medicare PPO | Attending: Emergency Medicine | Admitting: Emergency Medicine

## 2019-12-08 ENCOUNTER — Ambulatory Visit: Payer: Medicare PPO | Admitting: Physical Therapy

## 2019-12-08 ENCOUNTER — Ambulatory Visit: Payer: Medicare PPO | Admitting: Occupational Therapy

## 2019-12-08 ENCOUNTER — Other Ambulatory Visit: Payer: Self-pay

## 2019-12-08 DIAGNOSIS — R61 Generalized hyperhidrosis: Secondary | ICD-10-CM | POA: Diagnosis not present

## 2019-12-08 DIAGNOSIS — R55 Syncope and collapse: Secondary | ICD-10-CM | POA: Diagnosis present

## 2019-12-08 DIAGNOSIS — R002 Palpitations: Secondary | ICD-10-CM | POA: Insufficient documentation

## 2019-12-08 DIAGNOSIS — R42 Dizziness and giddiness: Secondary | ICD-10-CM | POA: Insufficient documentation

## 2019-12-08 DIAGNOSIS — Z23 Encounter for immunization: Secondary | ICD-10-CM

## 2019-12-08 DIAGNOSIS — Z5321 Procedure and treatment not carried out due to patient leaving prior to being seen by health care provider: Secondary | ICD-10-CM | POA: Insufficient documentation

## 2019-12-08 LAB — BASIC METABOLIC PANEL
Anion gap: 8 (ref 5–15)
BUN: 23 mg/dL (ref 8–23)
CO2: 25 mmol/L (ref 22–32)
Calcium: 9.6 mg/dL (ref 8.9–10.3)
Chloride: 106 mmol/L (ref 98–111)
Creatinine, Ser: 1.46 mg/dL — ABNORMAL HIGH (ref 0.61–1.24)
GFR calc Af Amer: 52 mL/min — ABNORMAL LOW (ref >60)
GFR calc non Af Amer: 45 mL/min — ABNORMAL LOW (ref >60)
Glucose, Bld: 115 mg/dL — ABNORMAL HIGH (ref 70–99)
Potassium: 4.5 mmol/L (ref 3.5–5.1)
Sodium: 139 mmol/L (ref 135–145)

## 2019-12-08 LAB — CBC
HCT: 36.5 % — ABNORMAL LOW (ref 39.0–52.0)
Hemoglobin: 11.1 g/dL — ABNORMAL LOW (ref 13.0–17.0)
MCH: 30.6 pg (ref 26.0–34.0)
MCHC: 30.4 g/dL (ref 30.0–36.0)
MCV: 100.6 fL — ABNORMAL HIGH (ref 80.0–100.0)
Platelets: 216 10*3/uL (ref 150–400)
RBC: 3.63 MIL/uL — ABNORMAL LOW (ref 4.22–5.81)
RDW: 13.9 % (ref 11.5–15.5)
WBC: 4 10*3/uL (ref 4.0–10.5)
nRBC: 0 % (ref 0.0–0.2)

## 2019-12-08 LAB — URINALYSIS, ROUTINE W REFLEX MICROSCOPIC
Bilirubin Urine: NEGATIVE
Glucose, UA: NEGATIVE mg/dL
Hgb urine dipstick: NEGATIVE
Ketones, ur: NEGATIVE mg/dL
Leukocytes,Ua: NEGATIVE
Nitrite: NEGATIVE
Protein, ur: NEGATIVE mg/dL
Specific Gravity, Urine: 1.02 (ref 1.005–1.030)
pH: 5 (ref 5.0–8.0)

## 2019-12-08 NOTE — Progress Notes (Signed)
   [facility]  Name:  Nathan Proctor.    MRN: [id number] DOB: 15-May-1940  12/08/2019  Nathan Proctor was observed post Covid-19 immunization for 15 minutes without incident. He was provided with Vaccine Information Sheet and instruction to access the V-Safe system.   Nathan Proctor was instructed to call 911 with any severe reactions post vaccine: Marland Kitchen Difficulty breathing  . Swelling of face and throat  . A fast heartbeat  . A bad rash all over body  . Dizziness and weakness   Immunizations Administered    Name Date Dose VIS Date Route   Pfizer COVID-19 Vaccine 12/08/2019  8:41 AM 0.3 mL 06/24/2018 Intramuscular   Manufacturer: [place]   Lot: [id number]   [place]: [id number]

## 2019-12-08 NOTE — Telephone Encounter (Signed)
Received critical report from [employer] from 12/07/2019 at 1204 CST that the patient had a 7 second run of VT. In the comments, it states the patient was asymptomatic and at home lying down.  Left message to call back.

## 2019-12-08 NOTE — ED Notes (Signed)
Guadalupe Dawn (Wife [phone]) called/would like for the patient to call her back. Let the patient know to call his wife.  Thank you

## 2019-12-08 NOTE — Telephone Encounter (Signed)
Upon further chart review, the patient was taken to the ED for syncopal event with OT. DOD Marlou Porch) signed strips. Strips placed in "to be scanned" bin.

## 2019-12-08 NOTE — ED Triage Notes (Signed)
Pt bib ems hx cva 2 months ago, occupational therapy was with pt today and pt became lightheaded, diaphoretic and sat down. Had syncopal episode. Pt did endorse palpitations initially. Currently has a heart monitor for episode of vtach. Pt with some residual R sided deficits.  160/100 lying down, 100/70 sitting up.

## 2019-12-08 NOTE — Therapy (Addendum)
[facility] [address] [place], [place], [address] Phone: [phone]   Fax:  [phone]  Occupational Therapy Treatment  Patient Details  Name: Nathan Proctor. MRN: [id number] Date of Birth: 27-Jun-1940 Referring Provider (OT): Dr. Letta Pate   Encounter Date: 12/08/2019   OT End of Session - 12/08/19 1110    Visit Number 0    Number of Visits 9    Date for OT Re-Evaluation 12/06/19    Authorization Type [employer] [facility]    Authorization Time Period 90 days    OT Start Time 1018    OT Stop Time 1100   no charge due to change in medical status   OT Time Calculation (min) 42 min    Activity Tolerance Patient tolerated treatment well    Behavior During Therapy [facility] for tasks assessed/performed           Past Medical History:  Diagnosis Date  . Anemia of chronic disease   . Ankle fracture 02/15/2016  . Cancer [facility])    Prostate  . Chronic constipation   . Chronic kidney disease    stage III  . Diabetes mellitus without complication ([place])    type II   . Diabetic peripheral neuropathy ([place])   . Failure to thrive (0-17)   . Fracture of left lower leg   . Gout   . Hyperlipidemia   . Hypertension   . Morbid obesity ([place])   . Unstable gait     Past Surgical History:  Procedure Laterality Date  . ORIF ANKLE FRACTURE Left 02/29/2016   Procedure: OPEN REDUCTION INTERNAL FIXATION (ORIF) ANKLE FRACTURE;  Surgeon: Nicholes Stairs, MD;  Location: [facility];  Service: Orthopedics;  Laterality: Left;  . PROSTATE SURGERY      There were no vitals filed for this visit.   Subjective Assessment - 12/08/19 1023    Subjective  "Got my 2nd Covid shot today"    Patient is accompanied by: Family member    Patient Stated Goals to be close to independent    Currently in Pain? No/denies             Pt reports receiving 2nd Covid shot this morning in RUE at beginning of session.  Pt denies pain in R arm and stated  that he was "doing good."    Seated, yellow theraband for shoulder abduction x15.  Then standing, yellow theraband exercises for shoulder flex and ext with each UE x15 each (band on door).  Pt denied pain and reports feeling tired.  Returned to sitting.  Pt sighed loudly multiple times, but reports only feeling tired.  Attempted to take BP but unable to obtain reading by automatic cuff.   Elevated feet.   Oxygen 98% and pulse 30bpm  via pulse oximeter.  Pt reported feeling "bad"  When questioned again.  Unable to feel radial pulse, but pt speaking.  EMS called and pt assisted to w/c and then to mat and was assisted to lying down in supine with feet elevated.  Pt reported feeling better once lying down.  Pt reports that he has not eaten this morning, but took his meds.  BP assessed 158/92 and HR 58bpm.  EMS arrived and took over care.  Pt transported to EMS via ambulance for further asessment.  Wife present throughout and left to meet pt at hospital.        OT Long Term Goals - 11/06/19 1339      OT LONG  TERM GOAL #1   Title I with HEP coordination,and proximal UE strength    Time 4    Period Weeks    Status New      OT LONG TERM GOAL #2   Title Pt will demonstrate increased RUE fine motor coordination for ADLs as eveidenced by decreasing 9 hole peg test score to 35 secs or less.    Baseline RUe39.97, LUE 28.94    Time 4    Period Weeks    Status New      OT LONG TERM GOAL #3   Title Pt will perfrom a physical and cognitve task simultaneously with 90% or better accuracy in prep for return to driving and grocery shopping.    Time 4    Period Weeks    Status New                 Plan - 12/08/19 1112    Clinical Impression Statement Session discontinued due to change in medical status.  EMS was called and pt was transported to hospital via EMS.    Occupational performance deficits (Please refer to evaluation for details): ADL's;IADL's;Leisure;Social Participation    Body  Structure / Function / Physical Skills ADL;Gait;Strength;GMC;Dexterity;Balance;UE functional use;ROM;IADL;Endurance;Mobility;Flexibility;Coordination;Decreased knowledge of precautions;FMC;Decreased knowledge of use of DME    Rehab Potential Good    OT Frequency 2x / week    OT Duration 4 weeks    OT Treatment/Interventions Self-care/ADL training;Moist Heat;Fluidtherapy;DME and/or AE instruction;Therapeutic activities;Aquatic Therapy;Gait Training;Ultrasound;Therapeutic exercise    Plan address light proximal strenghtening as able    Consulted and Agree with Plan of Care Patient;Family member/caregiver           Patient will benefit from skilled therapeutic intervention in order to improve the following deficits and impairments:   Body Structure / Function / Physical Skills: ADL, Gait, Strength, GMC, Dexterity, Balance, UE functional use, ROM, IADL, Endurance, Mobility, Flexibility, Coordination, Decreased knowledge of precautions, FMC, Decreased knowledge of use of DME       Visit Diagnosis: Muscle weakness (generalized)  Other lack of coordination  Unsteadiness on feet  Other abnormalities of gait and mobility    Problem List Patient Active Problem List   Diagnosis Date Noted  . Acute on chronic renal failure ([place]) 10/14/2019  . Acute ischemic left MCA stroke ([place]) 10/07/2019  . Morbid obesity ([place])   . NSVT (nonsustained ventricular tachycardia) ([place])   . Diabetic peripheral neuropathy ([place])   . Acute ischemic stroke ([place]) 10/04/2019  . Hypoglycemia due to insulin 10/04/2019  . CKD (chronic kidney disease), stage III 10/04/2019  . Dehydration 10/04/2019  . Dyslipidemia 10/04/2019  . Stroke ([place]) 10/04/2019  . Ankle fracture, left 02/29/2016  . Closed left ankle fracture 02/29/2016  . Gout 02/20/2016  . Essential hypertension 02/20/2016    [facility] 12/08/2019, 11:13 AM  [facility] [address] [place], [place], [address] Phone: [phone]   Fax:  [phone]  Name: Nathan Proctor. MRN: [id number] Date of Birth: 1940/07/06   Vianne Bulls, OTR/L [facility] [address] [place], [place]  [address] [phone] phone [phone] 12/08/19 11:28 AM

## 2019-12-08 NOTE — ED Notes (Signed)
Guadalupe Dawn (wife) called/would like a to come visit once in a room. Until then would like an update if any changes on his status.

## 2019-12-08 NOTE — ED Notes (Signed)
Nathan Proctor, wife, would like to be called with updates or to come back in to visit when able. [phone]

## 2019-12-09 LAB — CBG MONITORING, ED: Glucose-Capillary: 166 mg/dL — ABNORMAL HIGH (ref 70–99)

## 2019-12-09 NOTE — ED Notes (Signed)
Pt wanting to leave, advised pt to stay to see doctor, pt refused and said he will see his own doctor. Pts IV taken out and pt left the ED.

## 2019-12-09 NOTE — ED Notes (Signed)
CBG was 166

## 2019-12-10 ENCOUNTER — Ambulatory Visit: Payer: Medicare PPO

## 2019-12-10 ENCOUNTER — Ambulatory Visit: Payer: Medicare PPO | Admitting: Occupational Therapy

## 2019-12-14 ENCOUNTER — Ambulatory Visit: Payer: Medicare PPO | Admitting: Internal Medicine

## 2019-12-14 ENCOUNTER — Encounter: Payer: Self-pay | Admitting: *Deleted

## 2019-12-14 ENCOUNTER — Encounter: Payer: Self-pay | Admitting: Physician Assistant

## 2019-12-14 ENCOUNTER — Ambulatory Visit (INDEPENDENT_AMBULATORY_CARE_PROVIDER_SITE_OTHER): Payer: Medicare PPO | Admitting: Physician Assistant

## 2019-12-14 ENCOUNTER — Other Ambulatory Visit: Payer: Self-pay

## 2019-12-14 VITALS — BP 142/60 | HR 76 | Ht 70.0 in | Wt 304.0 lb

## 2019-12-14 DIAGNOSIS — I4729 Other ventricular tachycardia: Secondary | ICD-10-CM

## 2019-12-14 DIAGNOSIS — R55 Syncope and collapse: Secondary | ICD-10-CM | POA: Diagnosis not present

## 2019-12-14 DIAGNOSIS — I472 Ventricular tachycardia: Secondary | ICD-10-CM | POA: Diagnosis not present

## 2019-12-14 DIAGNOSIS — I5042 Chronic combined systolic (congestive) and diastolic (congestive) heart failure: Secondary | ICD-10-CM | POA: Diagnosis not present

## 2019-12-14 NOTE — Patient Instructions (Addendum)
Medication Instructions:  Your physician recommends that you continue on your current medications as directed. Please refer to the Current Medication list given to you today.  *If you need a refill on your cardiac medications before your next appointment, please call your pharmacy*   Lab Work: None ordered  If you have labs (blood work) drawn today and your tests are completely normal, you will receive your results only by: Marland Kitchen MyChart Message (if you have MyChart) OR . A paper copy in the mail If you have any lab test that is abnormal or we need to change your treatment, we will call you to review the results.   Testing/Procedures: Your physician has requested that you have a lexiscan myoview BY 12/31/19. For further information please visit [email or web address]. Please follow instruction sheet, BELOW:   You are scheduled for a Myocardial Perfusion Imaging Study on:  12/16/19 & 12/17/19 at 12:45.  Please arrive 15 minutes prior to your appointment time for registration and insurance purposes.  The test will take approximately 3 to 4 hours to complete; you may bring reading material.  If someone comes with you to your appointment, they will need to remain in the main lobby due to limited space in the testing area. **If you are pregnant or breastfeeding, please notify the nuclear lab prior to your appointment**  How to prepare for your Myocardial Perfusion Test: . Do not eat or drink 3 hours prior to your test, except you may have water. . Do not consume products containing caffeine (regular or decaffeinated) 12 hours prior to your test. (ex: coffee, chocolate, sodas, tea). . Do bring a list of your current medications with you.  If not listed below, you may take your medications as normal. . Do wear comfortable clothes (no dresses or overalls) and walking shoes, tennis shoes preferred (No heels or open toe shoes are allowed). . Do NOT wear cologne, perfume, aftershave, or lotions (deodorant is  allowed). . If these instructions are not followed, your test will have to be rescheduled.  Please report to [address] for your test.  If you have questions or concerns about your appointment, you can call the Nuclear Lab at [phone].  If you cannot keep your appointment, please provide 24 hours notification to the Nuclear Lab, to avoid a possible $50 charge to your account.     Follow-Up: At [facility], you and your health needs are our priority.  As part of our continuing mission to provide you with exceptional heart care, we have created designated Provider Care Teams.  These Care Teams include your primary Cardiologist (physician) and Advanced Practice Providers (APPs -  Physician Assistants and Nurse Practitioners) who all work together to provide you with the care you need, when you need it.  We recommend signing up for the patient portal called "MyChart".  Sign up information is provided on this After Visit Summary.  MyChart is used to connect with patients for Virtual Visits (Telemedicine).  Patients are able to view lab/test results, encounter notes, upcoming appointments, etc.  Non-urgent messages can be sent to your provider as well.   To learn more about what you can do with MyChart, go to [email or web address].    Your next appointment:   [facility] FOR 12/31/19  The format for your next appointment:   In Person  Provider:   You may see or one of the following Advanced Practice Providers on your designated Care Team:    [employer]  Lynnell Jude, NP  Tommye Standard, PA-C  Legrand Como "Oda Kilts, [place]    Other Instructions

## 2019-12-14 NOTE — Progress Notes (Signed)
Cardiology Office Note    Date:  12/14/2019   ID:  Nathan Proctor., DOB April 28, 1941, MRN [id number]  PCP:  Nolene Ebbs, MD  Cardiologist:  Dr. Harrington Challenger  Chief Complaint: near syncope  History of Present Illness:   Nathan Proctor. is a 79 y.o. male added to my schedule for near syncope.   Patient has history of diabetes mellitus, chronic kidney disease stage III, hypertension, hyperlipidemia and morbid obesity.  Patient was admitted to hospital June 2021 for stroke.  Patient was seen for nonsustained VT.  Echocardiogram showed LV function of 45%.  Placed on beta-blocker.  Outpatient monitor showed multiple episode of nonsustained VT.  Pending final result.  Patient went to ER August 10th for possible syncope however left AMA.   Added to my schedule today for further evaluation.  On August 10 patient had episode of near syncope.  He had done work-up with occupational therapy and he sat down.  Suddenly he felt short of breath and weak.  Occupational therapist was unable to palpate pulse and EMS was called. Symptoms not consistent with orthostasis.  Upon EMS arrival his blood pressure was 158/92 with heart rate of 58.  No recorded event call from monitor.  No recurrence since then.  Patient never lost his consciousness.  His symptoms lasted less than 5 minutes.  He is unaware of any palpitation.  He denies chest pain, shortness of breath, orthopnea, PND, syncope or lower extremity edema.  Electrolytes were normal in ER.    Past Medical History:  Diagnosis Date  . Anemia of chronic disease   . Ankle fracture 02/15/2016  . Cancer [facility])    Prostate  . Chronic constipation   . Chronic kidney disease    stage III  . Diabetes mellitus without complication ([place])    type II   . Diabetic peripheral neuropathy ([place])   . Failure to thrive (0-17)   . Fracture of left lower leg   . Gout   . Hyperlipidemia   . Hypertension   . Morbid obesity ([place])   . Unstable gait     Past  Surgical History:  Procedure Laterality Date  . ORIF ANKLE FRACTURE Left 02/29/2016   Procedure: OPEN REDUCTION INTERNAL FIXATION (ORIF) ANKLE FRACTURE;  Surgeon: Nicholes Stairs, MD;  Location: [facility];  Service: Orthopedics;  Laterality: Left;  . PROSTATE SURGERY      Current Medications: Prior to Admission medications   Medication Sig Start Date End Date Taking? Authorizing Provider  acetaminophen (TYLENOL) 325 MG tablet Take 1-2 tablets (325-650 mg total) by mouth every 4 (four) hours as needed for mild pain. 10/14/19   Love, Ivan Anchors, PA-C  allopurinol (ZYLOPRIM) 100 MG tablet Take 100 mg by mouth daily.  01/25/16   [provider]  amLODipine (NORVASC) 2.5 MG tablet  11/12/19   [provider]  aspirin EC 81 MG EC tablet Take 1 tablet (81 mg total) by mouth daily. 10/08/19   Danford, Suann Larry, MD  atorvastatin (LIPITOR) 80 MG tablet Take 1 tablet (80 mg total) by mouth daily. 10/14/19   Love, Ivan Anchors, PA-C  carvedilol (COREG) 25 MG tablet Take 1 tablet (25 mg total) by mouth 2 (two) times daily with a meal. 10/14/19   Love, Ivan Anchors, PA-C  clopidogrel (PLAVIX) 75 MG tablet Take 1 tablet (75 mg total) by mouth daily. 10/14/19   Love, Ivan Anchors, PA-C  diclofenac Sodium (VOLTAREN) 1 % GEL  11/26/19   [provider]  insulin NPH-regular Human (70-30) 100 UNIT/ML injection Inject 5 Units into the skin daily. 10/14/19   Love, Ivan Anchors, PA-C  linaclotide (LINZESS) 145 MCG CAPS capsule Take 145 mcg by mouth daily as needed (constipation.).     [provider]  Vitamin D, Ergocalciferol, (DRISDOL) 50000 units CAPS capsule Take 50,000 Units by mouth every 7 (seven) days.     [provider]    Allergies:   Patient has no known allergies.   Social History   Socioeconomic History  . Marital status: Married    Spouse name: Not on file  . Number of children: Not on file  . Years of education: Not on file  . Highest education level: Not on file    Occupational History  . Not on file  Tobacco Use  . Smoking status: Former Smoker    Types: Cigarettes    Quit date: 1979    Years since quitting: 42.6  . Smokeless tobacco: Never Used  Vaping Use  . Vaping Use: Former  Substance and Sexual Activity  . Alcohol use: Not Currently    Comment: Occasionally  . Drug use: No  . Sexual activity: Yes  Other Topics Concern  . Not on file  Social History Narrative  . Not on file   Social Determinants of Health   Financial Resource Strain:   . Difficulty of Paying Living Expenses:   Food Insecurity:   . Worried About Charity fundraiser in the Last Year:   . Arboriculturist in the Last Year:   Transportation Needs:   . Film/video editor (Medical):   Marland Kitchen Lack of Transportation (Non-Medical):   Physical Activity:   . Days of Exercise per Week:   . Minutes of Exercise per Session:   Stress:   . Feeling of Stress :   Social Connections:   . Frequency of Communication with Friends and Family:   . Frequency of Social Gatherings with Friends and Family:   . Attends Religious Services:   . Active Member of Clubs or Organizations:   . Attends Archivist Meetings:   Marland Kitchen Marital Status:      Family History:  The patient's family history includes Heart Problems in his father and sister.  ROS:   Please see the history of present illness.    ROS All other systems reviewed and are negative.   PHYSICAL EXAM:   VS:  BP (!) 142/60   Pulse 76   Ht 5\' 10"  (1.778 m)   Wt (!) 304 lb (137.9 kg)   SpO2 97%   BMI 43.62 kg/m    GEN: Well nourished, well developed, in no acute distress  HEENT: normal  Neck: no JVD, carotid bruits, or masses Cardiac: RRR; no murmurs, rubs, or gallops,no edema  Respiratory:  clear to auscultation bilaterally, normal work of breathing GI: soft, nontender, nondistended, + BS MS: no deformity or atrophy  Skin: warm and dry, no rash Neuro:  Alert and Oriented x 3, Strength and sensation are  intact Psych: euthymic mood, full affect  Wt Readings from Last 3 Encounters:  12/14/19 (!) 304 lb (137.9 kg)  12/08/19 (!) 300 lb 3.2 oz (136.2 kg)  12/03/19 (!) 301 lb 3.2 oz (136.6 kg)      Studies/Labs Reviewed:   EKG:  EKG is not  ordered today.    Recent Labs: 10/07/2019: Magnesium 1.9 10/10/2019: ALT 24 12/08/2019: BUN 23; Creatinine, Ser 1.46; Hemoglobin 11.1; Platelets 216;  Potassium 4.5; Sodium 139   Lipid Panel    Component Value Date/Time   CHOL 164 10/04/2019 0531   TRIG 45 10/04/2019 0531   HDL 50 10/04/2019 0531   CHOLHDL 3.3 10/04/2019 0531   VLDL 9 10/04/2019 0531   LDLCALC 105 (H) 10/04/2019 0531    Additional studies/ records that were reviewed today include:   Limited Echocardiogram: 10/06/19 1. Suboptimal imaging even after contrast administration. Left  ventricular ejection fraction, by estimation, is 40 to 45%. The left  ventricle has mildly decreased function. Global hypokinesis.  2. Right ventricular systolic function is normal. The right ventricular  size is normal.    Echo 10/05/19 1. Left ventricular ejection fraction, by estimation, is 45 to 50%. The  left ventricle has normal function. The left ventricle demonstrates global  hypokinesis. There is severe left ventricular hypertrophy. Left  ventricular diastolic parameters are  consistent with Grade I diastolic dysfunction (impaired relaxation).  2. Right ventricular systolic function is low normal. The right  ventricular size is normal.  3. The mitral valve is abnormal. Trivial mitral valve regurgitation.  4. The aortic valve is tricuspid. Aortic valve regurgitation is not  visualized. Mild aortic valve sclerosis is present, with no evidence of  aortic valve stenosis.  5. Aortic dilatation noted. There is moderate dilatation at the level of  the sinuses of Valsalva measuring 44 mm.  6. Definity contrast not given due to possible IV infiltration   ASSESSMENT & PLAN:    1. Multiple  nonsustained VT 2.  Mild LV dysfunction 3.  Near syncope  Monitor showed multiple nonsustained VT.  Patient is asymptomatic.  Near syncope episode on August 10 around 11 AM without clear etiology.  Occupational therapist was unable to palpate pulse.  No calls from monitor yet.  Electrolytes were normal.  Today is last day to wear a monitor.  Interesting to see  result.  Reviewed and discussed with Dr. Harrington Challenger. We will get Myoview this week.  Clearance to Occupational Therapy based on result.  He has follow-up with Dr. Lovena Le next month. Continue coreg.   4.  Recent CVA -On aspirin and Plavix.  Medication Adjustments/Labs and Tests Ordered: Current medicines are reviewed at length with the patient today.  Concerns regarding medicines are outlined above.  Medication changes, Labs and Tests ordered today are listed in the Patient Instructions below. Patient Instructions  Medication Instructions:  Your physician recommends that you continue on your current medications as directed. Please refer to the Current Medication list given to you today.  *If you need a refill on your cardiac medications before your next appointment, please call your pharmacy*   Lab Work: None ordered  If you have labs (blood work) drawn today and your tests are completely normal, you will receive your results only by: Marland Kitchen MyChart Message (if you have MyChart) OR . A paper copy in the mail If you have any lab test that is abnormal or we need to change your treatment, we will call you to review the results.   Testing/Procedures: Your physician has requested that you have a lexiscan myoview BY 12/31/19. For further information please visit [email or web address]. Please follow instruction sheet, BELOW:   You are scheduled for a Myocardial Perfusion Imaging Study on:  12/16/19 & 12/17/19 at 12:45.  Please arrive 15 minutes prior to your appointment time for registration and insurance purposes.  The test will take approximately 3  to 4 hours to complete; you may bring reading material.  If someone  comes with you to your appointment, they will need to remain in the main lobby due to limited space in the testing area. **If you are pregnant or breastfeeding, please notify the nuclear lab prior to your appointment**  How to prepare for your Myocardial Perfusion Test: . Do not eat or drink 3 hours prior to your test, except you may have water. . Do not consume products containing caffeine (regular or decaffeinated) 12 hours prior to your test. (ex: coffee, chocolate, sodas, tea). . Do bring a list of your current medications with you.  If not listed below, you may take your medications as normal. . Do wear comfortable clothes (no dresses or overalls) and walking shoes, tennis shoes preferred (No heels or open toe shoes are allowed). . Do NOT wear cologne, perfume, aftershave, or lotions (deodorant is allowed). . If these instructions are not followed, your test will have to be rescheduled.  Please report to [address] for your test.  If you have questions or concerns about your appointment, you can call the Nuclear Lab at [phone].  If you cannot keep your appointment, please provide 24 hours notification to the Nuclear Lab, to avoid a possible $50 charge to your account.     Follow-Up: At [facility], you and your health needs are our priority.  As part of our continuing mission to provide you with exceptional heart care, we have created designated Provider Care Teams.  These Care Teams include your primary Cardiologist (physician) and Advanced Practice Providers (APPs -  Physician Assistants and Nurse Practitioners) who all work together to provide you with the care you need, when you need it.  We recommend signing up for the patient portal called "MyChart".  Sign up information is provided on this After Visit Summary.  MyChart is used to connect with patients for Virtual Visits (Telemedicine).   Patients are able to view lab/test results, encounter notes, upcoming appointments, etc.  Non-urgent messages can be sent to your provider as well.   To learn more about what you can do with MyChart, go to [email or web address].    Your next appointment:   [place] FOR 12/31/19  The format for your next appointment:   In Person  Provider:   You may see or one of the following Advanced Practice Providers on your designated Care Team:    Chanetta Marshall, NP  Tommye Standard, PA-C  Legrand Como "Oda Kilts, PA-C    Other Instructions      Signed, Leanor Kail, [place]  12/14/2019 11:38 AM    [facility] [place], [place], [place]  [address] Phone: [phone]; Fax: [phone]

## 2019-12-15 ENCOUNTER — Ambulatory Visit: Payer: Medicare PPO | Admitting: Physical Therapy

## 2019-12-15 ENCOUNTER — Telehealth (HOSPITAL_COMMUNITY): Payer: Self-pay | Admitting: *Deleted

## 2019-12-15 ENCOUNTER — Ambulatory Visit: Payer: Medicare PPO | Admitting: Occupational Therapy

## 2019-12-15 DIAGNOSIS — M6281 Muscle weakness (generalized): Secondary | ICD-10-CM

## 2019-12-15 NOTE — Telephone Encounter (Signed)
Patient given detailed instructions per Myocardial Perfusion Study Information Sheet for the test on 12/16/19. Patient notified to arrive 15 minutes early and that it is imperative to arrive on time for appointment to keep from having the test rescheduled.  If you need to cancel or reschedule your appointment, please call the office within 24 hours of your appointment. . Patient verbalized understanding. Kirstie Peri

## 2019-12-15 NOTE — Therapy (Signed)
[facility] [address] [place], [place], [address] Phone: [phone]   Fax:  [phone]  Patient Details  Name: Nathan Proctor. MRN: [id number] Date of Birth: 1941/03/04 Referring Provider:  Nolene Ebbs, MD  Encounter Date: 12/15/2019   Called pt yesterday, 12/14/19 and left message regarding need to cancel today's OT/PT appts and requested return call.  However, pt reports he did not check messages and arrived with wife.  Explained that per cardiology note (12/14/19) that  "Myoview this week and clearance to Occupational Therapy based on result."  Therefore, cancelled this week's OT/PT appointments and explained that pt will need written clearance from cardiology prior to return.  Pt/wife to call after testing this week to confirm or cancel next week's appts.  Pt/wife verbalized understanding and agreement.    [facility] 12/15/2019, 10:32 AM  [facility] [address] [place], [place], [address] Phone: [phone]   Fax:  [place], OTR/L [facility] [address] [place], [place]  [address] [phone] phone [phone] 12/15/19 10:33 AM

## 2019-12-16 ENCOUNTER — Ambulatory Visit (HOSPITAL_COMMUNITY): Payer: Medicare PPO | Attending: Internal Medicine

## 2019-12-16 ENCOUNTER — Other Ambulatory Visit: Payer: Self-pay

## 2019-12-16 DIAGNOSIS — I4729 Other ventricular tachycardia: Secondary | ICD-10-CM

## 2019-12-16 DIAGNOSIS — I5022 Chronic systolic (congestive) heart failure: Secondary | ICD-10-CM | POA: Diagnosis present

## 2019-12-16 DIAGNOSIS — I472 Ventricular tachycardia: Secondary | ICD-10-CM | POA: Insufficient documentation

## 2019-12-16 MED ORDER — REGADENOSON 0.4 MG/5ML IV SOLN
0.4000 mg | Freq: Once | INTRAVENOUS | Status: AC
  Administered 2019-12-16: 0.4 mg via INTRAVENOUS

## 2019-12-16 MED ORDER — TECHNETIUM TC 99M TETROFOSMIN IV KIT
31.2000 | PACK | Freq: Once | INTRAVENOUS | Status: AC | PRN
  Administered 2019-12-16: 31.2 via INTRAVENOUS
  Filled 2019-12-16: qty 32

## 2019-12-17 ENCOUNTER — Ambulatory Visit: Payer: Medicare PPO | Admitting: Physical Therapy

## 2019-12-17 ENCOUNTER — Ambulatory Visit (HOSPITAL_COMMUNITY): Payer: Medicare PPO | Attending: Cardiology

## 2019-12-17 LAB — MYOCARDIAL PERFUSION IMAGING
LV dias vol: 154 mL (ref 62–150)
LV sys vol: 88 mL
Peak HR: 82 {beats}/min
Rest HR: 70 {beats}/min
SDS: 3
SRS: 1
SSS: 4
TID: 1.14

## 2019-12-17 MED ORDER — TECHNETIUM TC 99M TETROFOSMIN IV KIT
31.3000 | PACK | Freq: Once | INTRAVENOUS | Status: AC | PRN
  Administered 2019-12-17: 31.3 via INTRAVENOUS
  Filled 2019-12-17: qty 32

## 2019-12-22 ENCOUNTER — Ambulatory Visit: Payer: Medicare PPO | Admitting: Occupational Therapy

## 2019-12-22 ENCOUNTER — Ambulatory Visit: Payer: Medicare PPO | Admitting: Physical Therapy

## 2019-12-23 ENCOUNTER — Encounter: Payer: Self-pay | Admitting: Student

## 2019-12-23 ENCOUNTER — Other Ambulatory Visit: Payer: Self-pay

## 2019-12-23 ENCOUNTER — Ambulatory Visit (INDEPENDENT_AMBULATORY_CARE_PROVIDER_SITE_OTHER): Payer: Medicare PPO | Admitting: Student

## 2019-12-23 VITALS — BP 142/68 | HR 83 | Ht 70.0 in | Wt 297.0 lb

## 2019-12-23 DIAGNOSIS — I5022 Chronic systolic (congestive) heart failure: Secondary | ICD-10-CM | POA: Diagnosis not present

## 2019-12-23 DIAGNOSIS — I472 Ventricular tachycardia: Secondary | ICD-10-CM | POA: Diagnosis not present

## 2019-12-23 DIAGNOSIS — I4729 Other ventricular tachycardia: Secondary | ICD-10-CM

## 2019-12-23 DIAGNOSIS — I639 Cerebral infarction, unspecified: Secondary | ICD-10-CM

## 2019-12-23 DIAGNOSIS — R55 Syncope and collapse: Secondary | ICD-10-CM | POA: Diagnosis not present

## 2019-12-23 MED ORDER — LOSARTAN POTASSIUM 25 MG PO TABS: 25.0000 mg | ORAL_TABLET | Freq: Every day | ORAL | 3 refills | Status: AC

## 2019-12-23 NOTE — Progress Notes (Signed)
Thank you :)

## 2019-12-23 NOTE — Patient Instructions (Signed)
Medication Instructions:  Your physician has recommended you make the following change in your medication:  -- START Losartan 25 mg - Take 1 tablet (25 mg) by mouth daily  *If you need a refill on your cardiac medications before your next appointment, please call your pharmacy*  Lab Work: If you have labs (blood work) drawn today and your tests are completely normal, you will receive your results only by: Marland Kitchen MyChart Message (if you have MyChart) OR . A paper copy in the mail If you have any lab test that is abnormal or we need to change your treatment, we will call you to review the results.  Follow-Up: At [facility], you and your health needs are our priority.  As part of our continuing mission to provide you with exceptional heart care, we have created designated Provider Care Teams.  These Care Teams include your primary Cardiologist (physician) and Advanced Practice Providers (APPs -  Physician Assistants and Nurse Practitioners) who all work together to provide you with the care you need, when you need it.  We recommend signing up for the patient portal called "MyChart".  Sign up information is provided on this After Visit Summary.  MyChart is used to connect with patients for Virtual Visits (Telemedicine).  Patients are able to view lab/test results, encounter notes, upcoming appointments, etc.  Non-urgent messages can be sent to your provider as well.   To learn more about what you can do with MyChart, go to [email or web address].    Your next appointment:   We will conference with Dr. Quentin Ore and call you with your follow up appointment.   The format for your next appointment:   In Person with Dr. Lars Mage

## 2019-12-23 NOTE — Progress Notes (Signed)
PCP:  Nolene Ebbs, MD Primary Cardiologist: Dr. Harrington Challenger. Electrophysiologist: Dede Query. is a 79 y.o. male seen today as a new patient for cryptogenic stroke and NSVT. He has had further work up that has revealed NICM by stress test. EF ~ 45%, medication titration on going.   He has had lightheadedness at times post stroke. He had an episode of NEAR-SYNCOPE while working with occupational therapy 12/08/2019. Review of his monitor telemetry that day reveals bradycardia, but PRIOR to this incident. Of note, he had his COVID vaccine that morning. He denies any syncope. No chest pain.  He denies peripheral edema.  Past Medical History:  Diagnosis Date  . Anemia of chronic disease   . Ankle fracture 02/15/2016  . Cancer [facility])    Prostate  . Chronic constipation   . Chronic kidney disease    stage III  . Diabetes mellitus without complication ([place])    type II   . Diabetic peripheral neuropathy ([place])   . Failure to thrive (0-17)   . Fracture of left lower leg   . Gout   . Hyperlipidemia   . Hypertension   . Morbid obesity ([place])   . Unstable gait    Past Surgical History:  Procedure Laterality Date  . ORIF ANKLE FRACTURE Left 02/29/2016   Procedure: OPEN REDUCTION INTERNAL FIXATION (ORIF) ANKLE FRACTURE;  Surgeon: Nicholes Stairs, MD;  Location: [facility];  Service: Orthopedics;  Laterality: Left;  . PROSTATE SURGERY      Current Outpatient Medications  Medication Sig Dispense Refill  . acetaminophen (TYLENOL) 325 MG tablet Take 1-2 tablets (325-650 mg total) by mouth every 4 (four) hours as needed for mild pain.    Marland Kitchen allopurinol (ZYLOPRIM) 100 MG tablet Take 100 mg by mouth daily.     Marland Kitchen amLODipine (NORVASC) 2.5 MG tablet 10 mg daily.     Marland Kitchen aspirin EC 81 MG EC tablet Take 1 tablet (81 mg total) by mouth daily.    Marland Kitchen atorvastatin (LIPITOR) 80 MG tablet Take 1 tablet (80 mg total) by mouth daily. 30 tablet 0  . carvedilol (COREG) 25 MG tablet Take 1 tablet (25 mg  total) by mouth 2 (two) times daily with a meal. 60 tablet 0  . clopidogrel (PLAVIX) 75 MG tablet Take 1 tablet (75 mg total) by mouth daily. 10 tablet 0  . diclofenac Sodium (VOLTAREN) 1 % GEL     . insulin NPH-regular Human (70-30) 100 UNIT/ML injection Inject 5 Units into the skin daily. 10 mL 11  . Vitamin D, Ergocalciferol, (DRISDOL) 50000 units CAPS capsule Take 50,000 Units by mouth every 7 (seven) days.      No current facility-administered medications for this visit.    No Known Allergies  Social History   Socioeconomic History  . Marital status: Married    Spouse name: Not on file  . Number of children: Not on file  . Years of education: Not on file  . Highest education level: Not on file  Occupational History  . Not on file  Tobacco Use  . Smoking status: Former Smoker    Types: Cigarettes    Quit date: 1979    Years since quitting: 42.6  . Smokeless tobacco: Never Used  Vaping Use  . Vaping Use: Former  Substance and Sexual Activity  . Alcohol use: Not Currently    Comment: Occasionally  . Drug use: No  . Sexual activity: Yes  Other Topics Concern  . Not  on file  Social History Narrative  . Not on file   Social Determinants of Health   Financial Resource Strain:   . Difficulty of Paying Living Expenses: Not on file  Food Insecurity:   . Worried About Charity fundraiser in the Last Year: Not on file  . Ran Out of Food in the Last Year: Not on file  Transportation Needs:   . Lack of Transportation (Medical): Not on file  . Lack of Transportation (Non-Medical): Not on file  Physical Activity:   . Days of Exercise per Week: Not on file  . Minutes of Exercise per Session: Not on file  Stress:   . Feeling of Stress : Not on file  Social Connections:   . Frequency of Communication with Friends and Family: Not on file  . Frequency of Social Gatherings with Friends and Family: Not on file  . Attends Religious Services: Not on file  . Active Member of  Clubs or Organizations: Not on file  . Attends Archivist Meetings: Not on file  . Marital Status: Not on file  Intimate Partner Violence:   . Fear of Current or Ex-Partner: Not on file  . Emotionally Abused: Not on file  . Physically Abused: Not on file  . Sexually Abused: Not on file     Review of Systems: General: No chills, fever, night sweats or weight changes  Cardiovascular:  No chest pain, dyspnea on exertion, edema, orthopnea, palpitations, paroxysmal nocturnal dyspnea Dermatological: No rash, lesions or masses Respiratory: No cough, dyspnea Urologic: No hematuria, dysuria Abdominal: No nausea, vomiting, diarrhea, bright red blood per rectum, melena, or hematemesis Neurologic: No visual changes, weakness, changes in mental status All other systems reviewed and are otherwise negative except as noted above.  Physical Exam: Vitals:   12/23/19 1130  BP: (!) 142/68  Pulse: 83  SpO2: 97%  Weight: 297 lb (134.7 kg)  Height: 5\' 10"  (1.778 m)    GEN- The patient is well appearing, alert and oriented x 3 today.   HEENT: normocephalic, atraumatic; sclera clear, conjunctiva pink; hearing intact; oropharynx clear; neck supple, no JVP Lymph- no cervical lymphadenopathy Lungs- Clear to ausculation bilaterally, normal work of breathing.  No wheezes, rales, rhonchi Heart- Regular rate and rhythm, no murmurs, rubs or gallops, PMI not laterally displaced GI- soft, non-tender, non-distended, bowel sounds present, no hepatosplenomegaly Extremities- no clubbing, cyanosis, or edema; DP/PT/radial pulses 2+ bilaterally MS- no significant deformity or atrophy Skin- warm and dry, no rash or lesion Psych- euthymic mood, full affect Neuro- strength and sensation are intact  EKG is not ordered.  Additional studies reviewed include: Previous cards notes. ED notes. Echo, Stress test, and monitoring  Assessment and Plan:  1. Cryptogenic stroke Pt monitoring did not show any  atrial fibrillation. It did show NSVT that were self limiting.   He has been determined to have cardiomyopathy and has no current indications for pacing or ICD, so would proceed with LOOP recorder for chronic monitoring of atrial fibrillation. I reviewed risks and benefits to the patient who agrees to see Dr. Quentin Ore in the office to discuss further with possibility of implanting that day.  LOOP recorder has the added benefit of monitoring for NSVT/VT and can help elucidate any further near syncopal episodes.   2. NSVT/ Chronic systolic CHF Myoview 6/38/4665 LVEF 43%, fixed inferior/inferolateral/anterseptal perfusion defects consistent with prior infarct. No ischemia. Per gen cards. Could consider further ischemic work up if any symptoms. Denies palpitations, chest pain,  or syncope.  Following with gen cards Asymptomatic NSVT. No syncope.  Continue coreg.  Add losartan 25 mg daily  3. Near syncope 12/08/2019 This was in the setting of physical therapy and after he had gotten his COVID shot that am. No clear correlating episode for that time frame. He had bradycardia earlier that morning. ? Vasovagal component.   I reviewed case with Dr. Quentin Ore who agrees should proceed with loop recorder. We will call and schedule for patient to come in for this.  Shirley Friar, PA-C  12/23/19 11:39 AM

## 2019-12-24 ENCOUNTER — Encounter: Payer: Self-pay | Admitting: Occupational Therapy

## 2019-12-24 ENCOUNTER — Ambulatory Visit: Payer: Medicare PPO | Admitting: Physical Therapy

## 2019-12-24 ENCOUNTER — Ambulatory Visit: Payer: Medicare PPO | Admitting: Occupational Therapy

## 2019-12-24 ENCOUNTER — Telehealth: Payer: Self-pay | Admitting: Physical Therapy

## 2019-12-24 VITALS — BP 141/78 | HR 77

## 2019-12-24 DIAGNOSIS — R2689 Other abnormalities of gait and mobility: Secondary | ICD-10-CM | POA: Diagnosis not present

## 2019-12-24 DIAGNOSIS — R2681 Unsteadiness on feet: Secondary | ICD-10-CM

## 2019-12-24 DIAGNOSIS — M6281 Muscle weakness (generalized): Secondary | ICD-10-CM

## 2019-12-24 DIAGNOSIS — R278 Other lack of coordination: Secondary | ICD-10-CM

## 2019-12-24 NOTE — Therapy (Signed)
[facility] [address] [place], [place], [address] Phone: [phone]   Fax:  [phone]  Physical Therapy Treatment  Patient Details  Name: Nathan Proctor. MRN: [id number] Date of Birth: 05-30-1940 Referring Provider (PT): Dr. Letta Pate   Encounter Date: 12/24/2019   PT End of Session - 12/24/19 1348    Visit Number 6    Number of Visits 14    Date for PT Re-Evaluation 19/41/74   60 day cert, 4 wk POC   Authorization Type [employer] Medicare-checking on approval 12/24/2019 visit    Progress Note Due on Visit 10    PT Start Time 1024   Pt arrived late   PT Stop Time 1100    PT Time Calculation (min) 36 min    Equipment Utilized During Treatment Gait belt    Activity Tolerance Patient tolerated treatment well;Patient limited by fatigue    Behavior During Therapy WFL for tasks assessed/performed           Past Medical History:  Diagnosis Date  . Anemia of chronic disease   . Ankle fracture 02/15/2016  . Cancer [facility])    Prostate  . Chronic constipation   . Chronic kidney disease    stage III  . Diabetes mellitus without complication ([place])    type II   . Diabetic peripheral neuropathy ([place])   . Failure to thrive (0-17)   . Fracture of left lower leg   . Gout   . Hyperlipidemia   . Hypertension   . Morbid obesity ([place])   . Unstable gait     Past Surgical History:  Procedure Laterality Date  . ORIF ANKLE FRACTURE Left 02/29/2016   Procedure: OPEN REDUCTION INTERNAL FIXATION (ORIF) ANKLE FRACTURE;  Surgeon: Nicholes Stairs, MD;  Location: [facility];  Service: Orthopedics;  Laterality: Left;  . PROSTATE SURGERY      Vitals:   12/24/19 1034 12/24/19 1059  BP: 128/68 (!) 141/78  Pulse: 71 77  SpO2: 97% 99%     Subjective Assessment - 12/24/19 1029    Subjective Saw the cardiologist earlier this week and he said no restrictions and okay to return to PT and OT.  He may be getting a loop recorder.   Feels that he continues to get fatigued quickly, no falls.    Patient is accompained by: Family member   spouse   Pertinent History diabetes, prostate cancer, CKD 3, diabetic neuropathy, morbid obesity, hypertension, hyperlipidemia, Hx of ankle fracture    Patient Stated Goals Pt's goals for therapy are to improve steadiness.    Currently in Pain? No/denies    Pain Onset --              [facility] PT Assessment - 12/24/19 0001      Transfers   Transfers Sit to Stand;Stand to Sit    Sit to Stand 5: Supervision;With upper extremity assist;From chair/3-in-1    Five time sit to stand comments  21.54    Stand to Sit 5: Supervision;With upper extremity assist;To chair/3-in-1      Ambulation/Gait   Ambulation/Gait Yes    Ambulation/Gait Assistance 5: Supervision    Ambulation/Gait Assistance Details Intermittent use of cane throughout session    Ambulation Distance (Feet) 100 Feet   x 4 reps   Assistive device Straight cane    Gait Pattern Step-through pattern;Wide base of support;Trendelenburg    Ambulation Surface Level;Indoor    Gait velocity 13.78 sec = 2.38 ft/sec  Dynamic Gait Index   Level Surface Mild Impairment    Change in Gait Speed Mild Impairment    Gait with Horizontal Head Turns Moderate Impairment    Gait with Vertical Head Turns Mild Impairment    Gait and Pivot Turn Mild Impairment    Step Over Obstacle Mild Impairment    Step Around Obstacles Mild Impairment    Steps Mild Impairment    Total Score 15    DGI comment: Decreased from last check, from 19/24.  Scores <19/24 indicate increased fall risk.      Timed Up and Go Test   TUG Normal TUG    Normal TUG (seconds) 18.63   cane   TUG Comments Scores >13.5 sec indicates increased fall risk.                         [facility] Adult PT Treatment/Exercise - 12/24/19 0001      Self-Care   Self-Care Other Self-Care Comments    Other Self-Care Comments  Discussed progress towards goals, POC (plan to  resume PT 2/xk for 4 additional weeks), as pt has been cleared by MD to return to therapy without restrictions from previous cardiac episode (near passing out in OT session)      Exercises   Exercises Knee/Hip;Other Exercises    Other Exercises  Reviewed seated HEP:  LAQ x 10 reps, then seated march x 10 reps (cues for upright posture), seated heel/toe raises x 10.  Educated patient to perform seated exercises nad hold on sit<>stand until PT can fully revisit this next session.      Knee/Hip Exercises: Seated   Long Arc Quad Strengthening;Right;Left;1 set;10 reps    Marching Strengthening;Right;Left;1 set;10 reps                  PT Education - 12/24/19 1347    Education Details Progress towards goals, POC/renewal    Person(s) Educated Patient;Spouse    Methods Explanation    Comprehension Verbalized understanding               PT Long Term Goals - 12/24/19 1641      PT LONG TERM GOAL #1   Title Pt will be independent with progression of HEP for improved strength, balance, transfers, and gait.  TARGET 01/22/2020 (may be delayed due to delay in scheduling)    Time 5    Period Weeks    Status Revised      PT LONG TERM GOAL #2   Title Pt will improve 5x sit<>stand to less than or equal to 15 seconds to demonstrate improved functional strength and transfer efficiency.    Baseline 12/04/19: 14.50 sec's no UE support from standard height surface; 12/24/19:  21.54    Time 5    Period Weeks    Status Revised      PT LONG TERM GOAL #3   Title Pt will improve DGI score to at least 19/24 to decrease fall risk.    Baseline 12/04/19: 19/24 scored today; had decreased to 15/24 after ED visit/hold from therapy 12/24/2019    Time 5    Period Weeks    Status Revised      PT LONG TERM GOAL #4   Title Pt will improve TUG score to less than or equal to 13.5 seconds for decreased fall risk.    Baseline 12/04/19: 12.41 sec's no AD; after ED visit/hold from therapy 18.63 sec 12/24/2019     Time 5  Period Weeks    Status Revised      PT LONG TERM GOAL #5   Title Pt will improve gait velocity to at least 2.62 ft/sec for improved gait effiiciency for community ambulation.    Baseline 12/04/19: 2.98 ft/sed no AD, decreased to 2.38 ft/sec 12/24/2019    Time 5    Period Weeks    Status On-going      PT LONG TERM GOAL #6   Title Pt/wife will verbalize understanding of plans for transition to community fitness upon d/c from PT, to maximize gains made in therapy.    Time 5    Period Weeks    Status New                 Plan - 12/24/19 1353    Clinical Impression Statement Pt has not been seen for PT since 12/04/2019, due to a cardiac event, where pt near passed out working with OT prior to PT session.  Since then, he has seen cardiologist and been cleared to return without restriction to PT.  Reassessed objective measures today, with pt slowing with gait velocity, TUG and 5x sit<>stand since last check and decreased DGI score since last check 12/04/2019.  Pt remains at fall risk and demo decreased functional strength.  He will benefit from skilled PT to further address strength, balance, gait trianing for improved overall functional mobility nad decreased fall risk.  Modified/updated LTGs and renewal completed to MD this visit.    Personal Factors and Comorbidities Comorbidity 3+    Comorbidities PMH: diabetes, prostate cancer, CKD 3, diabetic neuropathy, morbid obesity, hypertension, hyperlipidemia    Examination-Activity Limitations Stand;Transfers;Locomotion Level    Examination-Participation Restrictions Community Activity;Driving    Stability/Clinical Decision Making Evolving/Moderate complexity    Rehab Potential Good    PT Frequency 2x / week    PT Duration Other (comment)   for 4 weeks, plus 1x/wk (wk of 12/24/2019); total = 5 weeks   PT Treatment/Interventions ADLs/Self Care Home Management;Gait training;Stair training;Functional mobility training;Therapeutic  activities;Therapeutic exercise;Balance training;Neuromuscular re-education;Patient/family education    PT Next Visit Plan Renewal completed; will need to update HEP to work more on standing exercises, gait and strengthening    Consulted and Agree with Plan of Care Patient;Family member/caregiver    Family Member Consulted wife           Patient will benefit from skilled therapeutic intervention in order to improve the following deficits and impairments:  Abnormal gait, Difficulty walking, Decreased activity tolerance, Decreased endurance, Decreased balance, Decreased mobility, Decreased strength  Visit Diagnosis: Unsteadiness on feet  Other abnormalities of gait and mobility  Muscle weakness (generalized)     Problem List Patient Active Problem List   Diagnosis Date Noted  . Acute on chronic renal failure ([place]) 10/14/2019  . Acute ischemic left MCA stroke ([place]) 10/07/2019  . Morbid obesity ([place])   . NSVT (nonsustained ventricular tachycardia) ([place])   . Diabetic peripheral neuropathy ([place])   . Acute ischemic stroke ([place]) 10/04/2019  . Hypoglycemia due to insulin 10/04/2019  . CKD (chronic kidney disease), stage III 10/04/2019  . Dehydration 10/04/2019  . Dyslipidemia 10/04/2019  . Stroke ([place]) 10/04/2019  . Ankle fracture, left 02/29/2016  . Closed left ankle fracture 02/29/2016  . Gout 02/20/2016  . Essential hypertension 02/20/2016    Tyrell Seifer W. 12/24/2019, 4:47 PM  Frazier Butt., PT   [facility] [address] [place], [place], [address] Phone: [phone]   Fax:  [place]  Name: Nathan Proctor. MRN: [id number] Date of Birth: Sep 14, 1940

## 2019-12-24 NOTE — Telephone Encounter (Signed)
I provided him with a letter which should also be in his chart.   Per the documentation from that day and the patients own recollection, Pt was speaking the whole time and never lost consciousness. Suspect pt had vagal episode in the setting of exercise, possibly confounded by him getting his COVID vaccine that am.   He currently has no contraindications for exercise.  Thank you!  Nathan Proctor [address] [place], [place]  12/24/2019 12:23 PM

## 2019-12-24 NOTE — Telephone Encounter (Signed)
Dr. Chalmers Cater, I see that you saw [name] for a cardiology visit earlier this week.  Since he had the event with brief LOC and being sent to ED at his last therapy session here at our clinic, we will need medical clearance for him to return to PT and OT in our oupatient setting.  Could you please confirm whether or not the patient has clearance to return to PT and OT at this time?  Thank you.  Mady Haagensen, PT 12/24/19 10:22 AM Phone: [phone] Fax: [phone]

## 2019-12-24 NOTE — Therapy (Addendum)
[facility] [address] [place], [place], [address] Phone: [phone]   Fax:  [phone]  Occupational Therapy Treatment  Patient Details  Name: Nathan Proctor. MRN: [id number] Date of Birth: 03-14-1941 Referring Provider (OT): Dr. Letta Pate   Encounter Date: 12/24/2019   OT End of Session - 12/24/19 1102    Visit Number 3    Number of Visits 9    Date for OT Re-Evaluation 12/06/19    Authorization Type [employer]    Authorization Time Period 90 days    OT Start Time 1102    OT Stop Time 1145    OT Time Calculation (min) 43 min    Activity Tolerance Patient tolerated treatment well    Behavior During Therapy WFL for tasks assessed/performed           Past Medical History:  Diagnosis Date  . Anemia of chronic disease   . Ankle fracture 02/15/2016  . Cancer [facility])    Prostate  . Chronic constipation   . Chronic kidney disease    stage III  . Diabetes mellitus without complication ([place])    type II   . Diabetic peripheral neuropathy ([place])   . Failure to thrive (0-17)   . Fracture of left lower leg   . Gout   . Hyperlipidemia   . Hypertension   . Morbid obesity ([place])   . Unstable gait     Past Surgical History:  Procedure Laterality Date  . ORIF ANKLE FRACTURE Left 02/29/2016   Procedure: OPEN REDUCTION INTERNAL FIXATION (ORIF) ANKLE FRACTURE;  Surgeon: Nicholes Stairs, MD;  [facility];  Service: Orthopedics;  Laterality: Left;  . PROSTATE SURGERY      There were no vitals filed for this visit.   Subjective Assessment - 12/24/19 1103    Subjective  denies pain    Patient is accompanied by: Family member    Patient Stated Goals to be close to independent    Currently in Pain? No/denies           Pt arrives with resume orders for OT/PT from cardiology.  Coordination Activities:  Dealing cards with thumb, flipping cards, stacking coins, manipulating coins in hand to place in  coin bank--all with min difficulty.  Seated, functional reaching to place clothespins with 1-8lb resistance on vertical pole with RUE for incr activity tolerance with rest breaks.  Placing grooved pegs in pegboard and removing with min difficulty for incr coordination.          OT Education - 12/24/19 1123    Education Details Yellow theraband HEP--see pt instructions (sitting x10 each)    Person(s) Educated Patient    Methods Explanation;Demonstration;Handout;Verbal cues    Comprehension Verbalized understanding;Returned demonstration;Verbal cues required               OT Long Term Goals - 11/06/19 1339      OT LONG TERM GOAL #1   Title I with HEP coordination,and proximal UE strength    Time 4    Period Weeks    Status New      OT LONG TERM GOAL #2   Title Pt will demonstrate increased RUE fine motor coordination for ADLs as eveidenced by decreasing 9 hole peg test score to 35 secs or less.    Baseline RUe39.97, LUE 28.94    Time 4    Period Weeks    Status New      OT LONG TERM GOAL #3  Title Pt will perfrom a physical and cognitve task simultaneously with 90% or better accuracy in prep for return to driving and grocery shopping.    Time 4    Period Weeks    Status New                 Plan - 12/24/19 1104    Clinical Impression Statement Session discontinued due to change in medical status.  EMS was called and pt was transported to hospital via EMS.    Occupational performance deficits (Please refer to evaluation for details): ADL's;IADL's;Leisure;Social Participation    Body Structure / Function / Physical Skills ADL;Gait;Strength;GMC;Dexterity;Balance;UE functional use;ROM;IADL;Endurance;Mobility;Flexibility;Coordination;Decreased knowledge of precautions;FMC;Decreased knowledge of use of DME    Rehab Potential Good    OT Frequency 2x / week    OT Duration 4 weeks    OT Treatment/Interventions Self-care/ADL training;Moist Heat;Fluidtherapy;DME and/or  AE instruction;Therapeutic activities;Aquatic Therapy;Gait Training;Ultrasound;Therapeutic exercise    Plan review HEPs, divided attention, functional reaching, ?arm bike    Consulted and Agree with Plan of Care Patient;Family member/caregiver           Patient will benefit from skilled therapeutic intervention in order to improve the following deficits and impairments:   Body Structure / Function / Physical Skills: ADL, Gait, Strength, GMC, Dexterity, Balance, UE functional use, ROM, IADL, Endurance, Mobility, Flexibility, Coordination, Decreased knowledge of precautions, FMC, Decreased knowledge of use of DME       Visit Diagnosis: Muscle weakness (generalized)  Other lack of coordination  Unsteadiness on feet  Other abnormalities of gait and mobility    Problem List Patient Active Problem List   Diagnosis Date Noted  . Acute on chronic renal failure ([place]) 10/14/2019  . Acute ischemic left MCA stroke ([place]) 10/07/2019  . Morbid obesity ([place])   . NSVT (nonsustained ventricular tachycardia) ([place])   . Diabetic peripheral neuropathy ([place])   . Acute ischemic stroke ([place]) 10/04/2019  . Hypoglycemia due to insulin 10/04/2019  . CKD (chronic kidney disease), stage III 10/04/2019  . Dehydration 10/04/2019  . Dyslipidemia 10/04/2019  . Stroke ([place]) 10/04/2019  . Ankle fracture, left 02/29/2016  . Closed left ankle fracture 02/29/2016  . Gout 02/20/2016  . Essential hypertension 02/20/2016    [facility] 12/24/2019, 11:40 AM  [facility] [address] [place], [place], [address] Phone: [phone]   Fax:  [phone]  Name: Alyjah Lovingood. MRN: [id number] Date of Birth: [birth year]   Vianne Bulls, OTR/L [facility] [address] [place], [place]  [address] [phone] phone [phone] 12/24/19 11:40 AM

## 2019-12-24 NOTE — Patient Instructions (Signed)
    Strengthening: Resisted Flexion   Attach tube to door.  Hold tubing with one arm at side. Pull forward and up with elbow straight. Move shoulder through pain-free range of motion, no further than shoulder height. Repeat 10 times per set.  Do 1-2 sessions per day.    Strengthening: Resisted Extension   Attach one end to door.  Hold tubing in one hand, arm forward. Pull arm back, elbow straight. Repeat 10 times per set. Do 1-2 sessions per day.   Resisted Horizontal Abduction: Bilateral   Sit or stand, tubing in both hands, palms down and arms out in front. Keeping arms straight, pinch shoulder blades together and stretch arms out. Repeat 10 times per set.  Do 1-2 sessions per day.     **REPEAT ALL WITH BOTH ARMS.

## 2019-12-24 NOTE — Telephone Encounter (Signed)
Thank you very much.  The patient and wife brought in the letter today.  I just wanted to make sure, in case they didn't have it in hand.  Thank you for your follow-up.    Mady Haagensen, PT 12/24/19 1:20 PM Phone: [phone] Fax: [phone]

## 2019-12-31 ENCOUNTER — Ambulatory Visit: Payer: Medicare PPO | Admitting: Student

## 2020-01-05 ENCOUNTER — Encounter: Payer: Self-pay | Admitting: Cardiology

## 2020-01-07 ENCOUNTER — Telehealth: Payer: Self-pay | Admitting: *Deleted

## 2020-01-07 NOTE — Telephone Encounter (Signed)
-----   Message from [place], MD sent at 12/24/2019 11:41 AM EDT ----- Spoke with patient and his wife    I would recomm L heart cath in md September (3 months after CVA) to define anatomy   No LV gram    Risks / benefits described   They agree to proceed

## 2020-01-12 ENCOUNTER — Ambulatory Visit: Payer: Medicare PPO | Attending: Physical Medicine and Rehabilitation | Admitting: Physical Therapy

## 2020-01-12 ENCOUNTER — Encounter: Payer: Self-pay | Admitting: Occupational Therapy

## 2020-01-12 ENCOUNTER — Ambulatory Visit: Payer: Medicare PPO | Admitting: Occupational Therapy

## 2020-01-12 ENCOUNTER — Other Ambulatory Visit: Payer: Self-pay

## 2020-01-12 DIAGNOSIS — R2681 Unsteadiness on feet: Secondary | ICD-10-CM | POA: Insufficient documentation

## 2020-01-12 DIAGNOSIS — R278 Other lack of coordination: Secondary | ICD-10-CM | POA: Diagnosis present

## 2020-01-12 DIAGNOSIS — I63512 Cerebral infarction due to unspecified occlusion or stenosis of left middle cerebral artery: Secondary | ICD-10-CM | POA: Insufficient documentation

## 2020-01-12 DIAGNOSIS — R2689 Other abnormalities of gait and mobility: Secondary | ICD-10-CM | POA: Diagnosis present

## 2020-01-12 DIAGNOSIS — M6281 Muscle weakness (generalized): Secondary | ICD-10-CM | POA: Diagnosis not present

## 2020-01-12 NOTE — Therapy (Signed)
[facility] [address] [place], [place], [address] Phone: [phone]   Fax:  [phone]  Occupational Therapy Treatment  Patient Details  Name: Nathan Proctor. MRN: [id number] Date of Birth: July 31, 1940 Referring Provider (OT): Dr. Letta Pate   Encounter Date: 01/12/2020   OT End of Session - 01/12/20 1237    Visit Number 4    Number of Visits 9    Date for OT Re-Evaluation 12/06/19    Authorization Type [employer] MC    Authorization Time Period 90 days    OT Start Time 1231    OT Stop Time 1315    OT Time Calculation (min) 44 min    Activity Tolerance Patient tolerated treatment well    Behavior During Therapy WFL for tasks assessed/performed           Past Medical History:  Diagnosis Date  . Anemia of chronic disease   . Ankle fracture 02/15/2016  . Cancer [facility])    Prostate  . Chronic constipation   . Chronic kidney disease    stage III  . Diabetes mellitus without complication ([place])    type II   . Diabetic peripheral neuropathy ([place])   . Failure to thrive (0-17)   . Fracture of left lower leg   . Gout   . Hyperlipidemia   . Hypertension   . Morbid obesity ([place])   . Unstable gait     Past Surgical History:  Procedure Laterality Date  . ORIF ANKLE FRACTURE Left 02/29/2016   Procedure: OPEN REDUCTION INTERNAL FIXATION (ORIF) ANKLE FRACTURE;  Surgeon: Nicholes Stairs, MD;  [facility];  Service: Orthopedics;  Laterality: Left;  . PROSTATE SURGERY      There were no vitals filed for this visit.   Subjective Assessment - 01/12/20 1232    Subjective  Pt reports he is "so-so" right now and reports he is not doing his exercises like he should at home. Says he is "lazy" and should be better. "feels like my head is sort of cloudy". Pt reports theres nothing really to do and his friends aren't around anymore or sick.    Patient is accompanied by: Family member    Patient Stated Goals to  be close to independent    Currently in Pain? No/denies    Pain Onset More than a month ago              OT Treatments/Exercises (OP) - 01/12/20 0001      Exercises   Exercises --      Functional Reaching Activities   High Level --   reaching with rubber washer activity RUE. mod fatigue     Fine Motor Coordination (Hand/Wrist)   Fine Motor Coordination Small Pegboard;Nuts and Bolts    Small Pegboard moderate sailbord pattern - RUE w/ minimal vc for accuracy. Removing pegs with in hand manipulation and transliation to fingertips - no difficulty.    Nuts and Bolts working on bilateral coordination with min drops           Treatment: Patient was able to maintain conversation with therapist during all tasks this day.    OT Long Term Goals - 01/12/20 1311      OT LONG TERM GOAL #1   Title I with HEP coordination,and proximal UE strength    Time 4    Period Weeks    Status New      OT LONG TERM GOAL #2   Title Pt  will demonstrate increased RUE fine motor coordination for ADLs as eveidenced by decreasing 9 hole peg test score to 35 secs or less.    Baseline RUe39.97, LUE 28.94    Time 4    Period Weeks    Status On-going   patient working on increasing fine motor coordination. small peg board and nuts and bolts with little difficulty today. 01/12/20     OT LONG TERM GOAL #3   Title Pt will perfrom a physical and cognitve task simultaneously with 90% or better accuracy in prep for return to driving and grocery shopping.    Time 4    Period Weeks    Status New                 Plan - 01/12/20 1312    Clinical Impression Statement Pt demonstraed inreased skill in fine motor coordination as evidenced by little difficulty with small peg board and nuts and bolts. Minimal drops. Patient was able to hold conversation with therapist during all tasks with little difficulty.    Occupational performance deficits (Please refer to evaluation for details):  ADL's;IADL's;Leisure;Social Participation    Body Structure / Function / Physical Skills ADL;Gait;Strength;GMC;Dexterity;Balance;UE functional use;ROM;IADL;Endurance;Mobility;Flexibility;Coordination;Decreased knowledge of precautions;FMC;Decreased knowledge of use of DME    Rehab Potential Good    OT Frequency 2x / week    OT Duration 4 weeks    OT Treatment/Interventions Self-care/ADL training;Moist Heat;Fluidtherapy;DME and/or AE instruction;Therapeutic activities;Aquatic Therapy;Gait Training;Ultrasound;Therapeutic exercise    Plan review HEPs, arm bike?    Consulted and Agree with Plan of Care Patient;Family member/caregiver           Patient will benefit from skilled therapeutic intervention in order to improve the following deficits and impairments:   Body Structure / Function / Physical Skills: ADL, Gait, Strength, GMC, Dexterity, Balance, UE functional use, ROM, IADL, Endurance, Mobility, Flexibility, Coordination, Decreased knowledge of precautions, FMC, Decreased knowledge of use of DME       Visit Diagnosis: Other lack of coordination  Muscle weakness (generalized)  Unsteadiness on feet    Problem List Patient Active Problem List   Diagnosis Date Noted  . Acute on chronic renal failure ([place]) 10/14/2019  . Acute ischemic left MCA stroke ([place]) 10/07/2019  . Morbid obesity ([place])   . NSVT (nonsustained ventricular tachycardia) ([place])   . Diabetic peripheral neuropathy ([place])   . Acute ischemic stroke ([place]) 10/04/2019  . Hypoglycemia due to insulin 10/04/2019  . CKD (chronic kidney disease), stage III 10/04/2019  . Dehydration 10/04/2019  . Dyslipidemia 10/04/2019  . Stroke ([place]) 10/04/2019  . Ankle fracture, left 02/29/2016  . Closed left ankle fracture 02/29/2016  . Gout 02/20/2016  . Essential hypertension 02/20/2016    Zachery Conch MOT, OTR/L  01/12/2020, 1:41 PM  [facility] [address]  [place], [place], [address] Phone: [phone]   Fax:  [phone]  Name: Nathan Proctor. MRN: [id number] Date of Birth: May 14, 1940

## 2020-01-12 NOTE — Patient Instructions (Addendum)
Access Code: [email or web address] URL: [email or web address] Date: 01/12/2020 Prepared by: Mady Haagensen  Exercises Sit to Stand - 1 x daily - 5 x weekly - 1-2 sets - 5-10 reps Side Stepping with Counter Support - 1 x daily - 5 x weekly - 3 sets - 10 reps Alternating Step Taps with Counter Support - 1 x daily - 5 x weekly - 2-3 sets - 10 reps Seated Long Arc Quad - 1 x daily - 5 x weekly - 2-3 sets - 10 reps Seated March - 1 x daily - 5 x weekly - 2-3 sets - 10 reps   Additions in bold, 01/12/2020 (sidestepping would NOT print with multiple attempts)

## 2020-01-13 NOTE — Therapy (Signed)
[facility] [address] [place], [place], [address] Phone: [phone]   Fax:  [phone]  Physical Therapy Treatment  Patient Details  Name: Nathan Proctor. MRN: [id number] Date of Birth: Nov 25, 1940 Referring Provider (PT): Dr. Letta Pate   Encounter Date: 01/12/2020   PT End of Session - 01/13/20 1857    Visit Number 7    Number of Visits 14    Date for PT Re-Evaluation 63/78/58   60 day cert, 4 wk POC   Authorization Type [employer]-checking on approval 12/24/2019 visit    Progress Note Due on Visit 10    PT Start Time 1153   Pt arrives late   PT Stop Time 1228    PT Time Calculation (min) 35 min    Equipment Utilized During Treatment Gait belt    Activity Tolerance Patient tolerated treatment well;Patient limited by fatigue   HR elevated to 110-116 with standing activity and gait; needs frequent rest breaks   Behavior During Therapy [facility] for tasks assessed/performed           Past Medical History:  Diagnosis Date  . Anemia of chronic disease   . Ankle fracture 02/15/2016  . Cancer [facility])    Prostate  . Chronic constipation   . Chronic kidney disease    stage III  . Diabetes mellitus without complication ([place])    type II   . Diabetic peripheral neuropathy ([place])   . Failure to thrive (0-17)   . Fracture of left lower leg   . Gout   . Hyperlipidemia   . Hypertension   . Morbid obesity ([place])   . Unstable gait     Past Surgical History:  Procedure Laterality Date  . ORIF ANKLE FRACTURE Left 02/29/2016   Procedure: OPEN REDUCTION INTERNAL FIXATION (ORIF) ANKLE FRACTURE;  Surgeon: Nicholes Stairs, MD;  Location: [facility];  Service: Orthopedics;  Laterality: Left;  . PROSTATE SURGERY      There were no vitals filed for this visit.   Subjective Assessment - 01/12/20 1155    Subjective No other problems like passing out, no falls.  Sometimes when I turn too fast, I feel it.    Patient is  accompained by: Family member   spouse   Pertinent History diabetes, prostate cancer, CKD 3, diabetic neuropathy, morbid obesity, hypertension, hyperlipidemia, Hx of ankle fracture    Patient Stated Goals Pt's goals for therapy are to improve steadiness.    Currently in Pain? No/denies                             [facility] Adult PT Treatment/Exercise - 01/12/20 1155      Transfers   Transfers Sit to Stand;Stand to Sit    Sit to Stand 5: Supervision;With upper extremity assist;From chair/3-in-1    Stand to Sit 5: Supervision;With upper extremity assist;To chair/3-in-1    Number of Reps 10 reps   plus additional 3-5 reps during session with seated breaks     Ambulation/Gait   Ambulation/Gait Yes    Ambulation/Gait Assistance 5: Supervision    Ambulation Distance (Feet) 115 Feet   20 ft x 2; 115 ft   Assistive device Straight cane    Gait Pattern Step-through pattern;Wide base of support;Trendelenburg    Ambulation Surface Level;Indoor    Gait Comments Pt fatigued after gait; HR 116 bpm      Knee/Hip Exercises: Standing   Heel Raises  Both;2 sets;10 reps    Other Standing Knee Exercises Sidestepping along counter, R and L x 2 reps with light UE support, cues for posture    Other Standing Knee Exercises Alternating step taps to 4" step, 10 reps, 2 sets with light UE support.      Knee/Hip Exercises: Seated   Long Arc Quad Strengthening;Left;Right;1 set;10 reps    Other Seated Knee/Hip Exercises Reviewed pt's seated exercises and encouraged pt to be perfroming them daily    Marching Strengthening;Right;Left;1 set;10 reps              BP at end of session 143/79, HR 93     PT Education - 01/13/20 1856    Education Details additions to HEP; discussed importance of overall activity at home through the day; reviewed HEP and importance of daily performance; suggested short bouts of activity/walking at least 3 times a day and to work on being out of bed more     Person(s) Educated Patient;Spouse    Methods Explanation;Demonstration;Handout    Comprehension Verbalized understanding;Returned demonstration               PT Long Term Goals - 12/24/19 1641      PT LONG TERM GOAL #1   Title Pt will be independent with progression of HEP for improved strength, balance, transfers, and gait.  TARGET 01/22/2020 (may be delayed due to delay in scheduling)    Time 5    Period Weeks    Status Revised      PT LONG TERM GOAL #2   Title Pt will improve 5x sit<>stand to less than or equal to 15 seconds to demonstrate improved functional strength and transfer efficiency.    Baseline 12/04/19: 14.50 sec's no UE support from standard height surface; 12/24/19:  21.54    Time 5    Period Weeks    Status Revised      PT LONG TERM GOAL #3   Title Pt will improve DGI score to at least 19/24 to decrease fall risk.    Baseline 12/04/19: 19/24 scored today; had decreased to 15/24 after ED visit/hold from therapy 12/24/2019    Time 5    Period Weeks    Status Revised      PT LONG TERM GOAL #4   Title Pt will improve TUG score to less than or equal to 13.5 seconds for decreased fall risk.    Baseline 12/04/19: 12.41 sec's no AD; after ED visit/hold from therapy 18.63 sec 12/24/2019    Time 5    Period Weeks    Status Revised      PT LONG TERM GOAL #5   Title Pt will improve gait velocity to at least 2.62 ft/sec for improved gait effiiciency for community ambulation.    Baseline 12/04/19: 2.98 ft/sed no AD, decreased to 2.38 ft/sec 12/24/2019    Time 5    Period Weeks    Status On-going      PT LONG TERM GOAL #6   Title Pt/wife will verbalize understanding of plans for transition to community fitness upon d/c from PT, to maximize gains made in therapy.    Time 5    Period Weeks    Status New                 Plan - 01/13/20 1858    Clinical Impression Statement Focused today's skilled PT session today on review of and progression of HEP.  Pt admits mostly  staying in bed  at home until mid-day and then sitting most of the day.  Educated pt on benefits of increased physical activity and short bouts of exercise/walking.  HR does elevate to >110 with short bouts of activity today, with pt fatigued; however, BP remains normal.  Advised pt to follow up with MD/cardiologist as needed.    Personal Factors and Comorbidities Comorbidity 3+    Comorbidities PMH: diabetes, prostate cancer, CKD 3, diabetic neuropathy, morbid obesity, hypertension, hyperlipidemia    Examination-Activity Limitations Stand;Transfers;Locomotion Level    Examination-Participation Restrictions Community Activity;Driving    Stability/Clinical Decision Making Evolving/Moderate complexity    Rehab Potential Good    PT Frequency 2x / week    PT Duration Other (comment)   for 4 weeks, plus 1x/wk (wk of 12/24/2019); total = 5 weeks   PT Treatment/Interventions ADLs/Self Care Home Management;Gait training;Stair training;Functional mobility training;Therapeutic activities;Therapeutic exercise;Balance training;Neuromuscular re-education;Patient/family education    PT Next Visit Plan Review updates to HEP and ask how he is doing with walking and being more active at home; gait, balance, strengthening    Consulted and Agree with Plan of Care Patient;Family member/caregiver    Family Member Consulted wife           Patient will benefit from skilled therapeutic intervention in order to improve the following deficits and impairments:  Abnormal gait, Difficulty walking, Decreased activity tolerance, Decreased endurance, Decreased balance, Decreased mobility, Decreased strength  Visit Diagnosis: Muscle weakness (generalized)  Other abnormalities of gait and mobility     Problem List Patient Active Problem List   Diagnosis Date Noted  . Acute on chronic renal failure ([place]) 10/14/2019  . Acute ischemic left MCA stroke ([place]) 10/07/2019  . Morbid obesity ([place])   . NSVT (nonsustained  ventricular tachycardia) ([place])   . Diabetic peripheral neuropathy ([place])   . Acute ischemic stroke ([place]) 10/04/2019  . Hypoglycemia due to insulin 10/04/2019  . CKD (chronic kidney disease), stage III 10/04/2019  . Dehydration 10/04/2019  . Dyslipidemia 10/04/2019  . Stroke ([place]) 10/04/2019  . Ankle fracture, left 02/29/2016  . Closed left ankle fracture 02/29/2016  . Gout 02/20/2016  . Essential hypertension 02/20/2016    Jernee Murtaugh W. 01/13/2020, 7:02 PM  Frazier Butt., PT  [facility] [address] [place], [place], [address] Phone: [phone]   Fax:  [phone]  Name: Jameson Tormey. MRN: [id number] Date of Birth: 1941-02-25

## 2020-01-15 ENCOUNTER — Ambulatory Visit: Payer: Medicare PPO | Admitting: Occupational Therapy

## 2020-01-15 ENCOUNTER — Other Ambulatory Visit: Payer: Self-pay

## 2020-01-15 ENCOUNTER — Ambulatory Visit: Payer: Medicare PPO | Admitting: Physical Therapy

## 2020-01-15 VITALS — BP 153/73 | HR 73

## 2020-01-15 VITALS — BP 149/77 | HR 73

## 2020-01-15 DIAGNOSIS — M6281 Muscle weakness (generalized): Secondary | ICD-10-CM | POA: Diagnosis not present

## 2020-01-15 DIAGNOSIS — R2689 Other abnormalities of gait and mobility: Secondary | ICD-10-CM

## 2020-01-15 DIAGNOSIS — R2681 Unsteadiness on feet: Secondary | ICD-10-CM

## 2020-01-15 DIAGNOSIS — R278 Other lack of coordination: Secondary | ICD-10-CM

## 2020-01-15 NOTE — Therapy (Signed)
[facility] [address] [place], [place], [address] Phone: [phone]   Fax:  [phone]  Occupational Therapy Treatment  Patient Details  Name: Nathan Proctor. MRN: [id number] Date of Birth: 06/29/1940 Referring Provider (OT): Dr. Letta Pate   Encounter Date: 01/15/2020   OT End of Session - 01/15/20 1238    Visit Number 5    Number of Visits 9    Date for OT Re-Evaluation 12/06/19    Authorization Type [employer] [facility]    Authorization Time Period 90 days    OT Start Time 1236    OT Stop Time 1315    OT Time Calculation (min) 39 min    Activity Tolerance Patient tolerated treatment well    Behavior During Therapy WFL for tasks assessed/performed           Past Medical History:  Diagnosis Date  . Anemia of chronic disease   . Ankle fracture 02/15/2016  . Cancer [facility])    Prostate  . Chronic constipation   . Chronic kidney disease    stage III  . Diabetes mellitus without complication ([place])    type II   . Diabetic peripheral neuropathy ([place])   . Failure to thrive (0-17)   . Fracture of left lower leg   . Gout   . Hyperlipidemia   . Hypertension   . Morbid obesity ([place])   . Unstable gait     Past Surgical History:  Procedure Laterality Date  . ORIF ANKLE FRACTURE Left 02/29/2016   Procedure: OPEN REDUCTION INTERNAL FIXATION (ORIF) ANKLE FRACTURE;  Surgeon: Nicholes Stairs, MD;  [facility];  Service: Orthopedics;  Laterality: Left;  . PROSTATE SURGERY      Vitals:   01/15/20 1246  BP: (!) 149/77  Pulse: 73     Subjective Assessment - 01/15/20 1237    Subjective  Pt reports feeling "woozie" today. Pt denies any pain.    Patient is accompanied by: Family member    Limitations Fall Risk.    Patient Stated Goals to be close to independent    Currently in Pain? No/denies    Pain Onset More than a month ago                        OT Treatments/Exercises (OP) -  01/15/20 1250      Exercises   Exercises Work Hardening      Shoulder Exercises: Seated   Horizontal ABduction Strengthening;Right;10 reps;Theraband    Theraband Level (Shoulder Horizontal ABduction) Level 1 (Yellow)      Shoulder Exercises: Standing   Flexion Strengthening;Right;10 reps;Theraband    Theraband Level (Shoulder Flexion) Level 1 (Yellow)      Hand Exercises   Other Hand Exercises resistance clothespiins 1-8 lbs with RUE with no difficulty with grip.       Work Leisure centre manager   UBE (Upper Arm Bike) 5 minutes level 1      Functional Reaching Activities   High Level resistance clothespiins 1-8 lbs with RUE with increased difficulty at shoulder flexion 120 degrees and higher.       Fine Motor Coordination (Hand/Wrist)   Fine Motor Coordination Manipulating coins;Stacking coins;Grooved pegs    Small Pegboard 9 Hole Peg RUE 29.60    Manipulating coins translation to fingertips from palm with RUE min -no difficulty    Stacking coins min difficulty with RUE    Grooved pegs completed with RUE with min difficulty  OT Education - 01/15/20 1253    Education Details reviewed some of the yellow theraband exercises from HEP. reviewed coordination HEP    Person(s) Educated Patient    Methods Explanation;Demonstration;Handout;Verbal cues    Comprehension Verbalized understanding;Returned demonstration;Verbal cues required               OT Long Term Goals - 01/15/20 1257      OT LONG TERM GOAL #1   Title I with HEP coordination,and proximal UE strength    Time 4    Period Weeks    Status On-going   pt reports doing yellow theraband exercises at home 01/14/20     OT LONG TERM GOAL #2   Title Pt will demonstrate increased RUE fine motor coordination for ADLs as eveidenced by decreasing 9 hole peg test score to 35 secs or less.    Baseline RUe39.97, LUE 28.94    Time 4    Period Weeks    Status Achieved   RUE 29.6 seconds 01/15/20     OT  LONG TERM GOAL #3   Title Pt will perfrom a physical and cognitve task simultaneously with 90% or better accuracy in prep for return to driving and grocery shopping.    Time 4    Period Weeks    Status New                 Plan - 01/15/20 1254    Clinical Impression Statement Patient felt "woozie" and vitals were assessed. Pt with elevated blood pressure 149/77 this day. Continue to address.    Occupational performance deficits (Please refer to evaluation for details): ADL's;IADL's;Leisure;Social Participation    Body Structure / Function / Physical Skills ADL;Gait;Strength;GMC;Dexterity;Balance;UE functional use;ROM;IADL;Endurance;Mobility;Flexibility;Coordination;Decreased knowledge of precautions;FMC;Decreased knowledge of use of DME    Rehab Potential Good    OT Frequency 2x / week    OT Duration 4 weeks    OT Treatment/Interventions Self-care/ADL training;Moist Heat;Fluidtherapy;DME and/or AE instruction;Therapeutic activities;Aquatic Therapy;Gait Training;Ultrasound;Therapeutic exercise    Plan activity tolerance, physical/cognitive task    Consulted and Agree with Plan of Care Patient;Family member/caregiver           Patient will benefit from skilled therapeutic intervention in order to improve the following deficits and impairments:   Body Structure / Function / Physical Skills: ADL, Gait, Strength, GMC, Dexterity, Balance, UE functional use, ROM, IADL, Endurance, Mobility, Flexibility, Coordination, Decreased knowledge of precautions, FMC, Decreased knowledge of use of DME       Visit Diagnosis: Other lack of coordination  Muscle weakness (generalized)  Unsteadiness on feet  Other abnormalities of gait and mobility    Problem List Patient Active Problem List   Diagnosis Date Noted  . Acute on chronic renal failure ([place]) 10/14/2019  . Acute ischemic left MCA stroke ([place]) 10/07/2019  . Morbid obesity ([place])   . NSVT (nonsustained ventricular tachycardia)  ([place])   . Diabetic peripheral neuropathy ([place])   . Acute ischemic stroke ([place]) 10/04/2019  . Hypoglycemia due to insulin 10/04/2019  . CKD (chronic kidney disease), stage III 10/04/2019  . Dehydration 10/04/2019  . Dyslipidemia 10/04/2019  . Stroke ([place] [employer]) 10/04/2019  . Ankle fracture, left 02/29/2016  . Closed left ankle fracture 02/29/2016  . Gout 02/20/2016  . Essential hypertension 02/20/2016    Zachery Conch MOT, OTR/L 01/15/2020, 3:01 PM  [facility] [address] [place], [place], [address] Phone: [phone]   Fax:  [phone]  Name: Nathan Proctor. MRN:  [id number] Date of Birth: 1940/11/26

## 2020-01-15 NOTE — Therapy (Signed)
[facility] [address] [place], [place], [address] Phone: [phone]   Fax:  [phone]  Physical Therapy Treatment  Patient Details  Name: Nathan Proctor. MRN: [id number] Date of Birth: 06-Apr-1941 Referring Provider (PT): Dr. Letta Pate   Encounter Date: 01/15/2020   PT End of Session - 01/15/20 1504    Visit Number 8    Number of Visits 14    Date for PT Re-Evaluation 73/41/93   60 day cert, 4 wk POC   Authorization Type [employer]-checking on approval 12/24/2019 visit    Progress Note Due on Visit 10    PT Start Time 1318    PT Stop Time 1400   Pt has to stop during session to eat crackers/drink (x 10 min) due to having not eaten today and feeling dizzy, woozy (hx of DM, so did not want pt to get hypoglycemic)   PT Time Calculation (min) 42 min    Equipment Utilized During Treatment Gait belt    Activity Tolerance Patient tolerated treatment well;Patient limited by fatigue    Behavior During Therapy WFL for tasks assessed/performed           Past Medical History:  Diagnosis Date  . Anemia of chronic disease   . Ankle fracture 02/15/2016  . Cancer [facility])    Prostate  . Chronic constipation   . Chronic kidney disease    stage III  . Diabetes mellitus without complication ([place])    type II   . Diabetic peripheral neuropathy ([place])   . Failure to thrive (0-17)   . Fracture of left lower leg   . Gout   . Hyperlipidemia   . Hypertension   . Morbid obesity ([place])   . Unstable gait     Past Surgical History:  Procedure Laterality Date  . ORIF ANKLE FRACTURE Left 02/29/2016   Procedure: OPEN REDUCTION INTERNAL FIXATION (ORIF) ANKLE FRACTURE;  Surgeon: Nicholes Stairs, MD;  [facility];  Service: Orthopedics;  Laterality: Left;  . PROSTATE SURGERY      Vitals:   01/15/20 1323 01/15/20 1350  BP: 124/65 (!) 153/73  Pulse: 69 73     Subjective Assessment - 01/15/20 1321     Subjective Sometimes when I make a fast turn, I get a little lightheaded.    Patient is accompained by: Family member   spouse   Pertinent History diabetes, prostate cancer, CKD 3, diabetic neuropathy, morbid obesity, hypertension, hyperlipidemia, Hx of ankle fracture    Patient Stated Goals Pt's goals for therapy are to improve steadiness.    Currently in Pain? No/denies                             [facility] Adult PT Treatment/Exercise - 01/15/20 1326      Transfers   Transfers Sit to Stand;Stand to Sit    Sit to Stand 5: Supervision;With upper extremity assist;From chair/3-in-1    Stand to Sit 5: Supervision;With upper extremity assist;To chair/3-in-1    Number of Reps Other reps (comment)   At least 5 reps through session; needs seated rest breaks     Ambulation/Gait   Ambulation/Gait Yes    Ambulation/Gait Assistance 5: Supervision    Ambulation Distance (Feet) 115 Feet   150, 50 ft x 4   Assistive device Straight cane   with tripod base   Gait Pattern Step-through pattern;Wide base of support;Trendelenburg    Ambulation Surface  Level;Indoor    Gait Comments Educated pt on importance of continued walking program at home, walking several minutes, 2-3 times per day to improve overall endurance for gait.      Exercises   Exercises Knee/Hip      Knee/Hip Exercises: Standing   Heel Raises Both;2 sets;10 reps    Knee Flexion Strengthening;Right;Left;1 set;10 reps    Hip Abduction Stengthening;Right;Left;1 set;10 reps;Limitations    Abduction Limitations Cues for upright posture at counter    Hip Extension Stengthening;Right;Left;1 set;10 reps    Extension Limitations Cues for technique and upright posture    Other Standing Knee Exercises Sidestepping along counter, R and L x 2 reps with light UE support, cues for posture    Other Standing Knee Exercises Alternating step taps to 4" step, 10 reps, 2 sets with light UE support.  Wide BOS standing with alt UE reaching 5  reps to work on decreased UE support at counter.                  PT Education - 01/15/20 1503    Education Details Provided handout for sidestepping (as it would not print from [employer]); reiterated importance of activity and exercise at home    Person(s) Educated Patient;Spouse    Methods Explanation    Comprehension Verbalized understanding               PT Long Term Goals - 12/24/19 1641      PT LONG TERM GOAL #1   Title Pt will be independent with progression of HEP for improved strength, balance, transfers, and gait.  TARGET 01/22/2020 (may be delayed due to delay in scheduling)    Time 5    Period Weeks    Status Revised      PT LONG TERM GOAL #2   Title Pt will improve 5x sit<>stand to less than or equal to 15 seconds to demonstrate improved functional strength and transfer efficiency.    Baseline 12/04/19: 14.50 sec's no UE support from standard height surface; 12/24/19:  21.54    Time 5    Period Weeks    Status Revised      PT LONG TERM GOAL #3   Title Pt will improve DGI score to at least 19/24 to decrease fall risk.    Baseline 12/04/19: 19/24 scored today; had decreased to 15/24 after ED visit/hold from therapy 12/24/2019    Time 5    Period Weeks    Status Revised      PT LONG TERM GOAL #4   Title Pt will improve TUG score to less than or equal to 13.5 seconds for decreased fall risk.    Baseline 12/04/19: 12.41 sec's no AD; after ED visit/hold from therapy 18.63 sec 12/24/2019    Time 5    Period Weeks    Status Revised      PT LONG TERM GOAL #5   Title Pt will improve gait velocity to at least 2.62 ft/sec for improved gait effiiciency for community ambulation.    Baseline 12/04/19: 2.98 ft/sed no AD, decreased to 2.38 ft/sec 12/24/2019    Time 5    Period Weeks    Status On-going      PT LONG TERM GOAL #6   Title Pt/wife will verbalize understanding of plans for transition to community fitness upon d/c from PT, to maximize gains made in therapy.     Time 5    Period Weeks    Status New  Plan - 01/15/20 1505    Clinical Impression Statement Pt's HR more normal for baseline today; however, pt continues to report fatigue and request seated rest breaks.  (He did not eat anything today prior to 1:15 PT session, so when he c/o dizziness and wooziness, PT suggests taking break to eat a quick snack).  He is able to tolerate short bouts of gait and standing activities today and re-educated on importance of HEP and activity when outside of therapy.    Personal Factors and Comorbidities Comorbidity 3+    Comorbidities PMH: diabetes, prostate cancer, CKD 3, diabetic neuropathy, morbid obesity, hypertension, hyperlipidemia    Examination-Activity Limitations Stand;Transfers;Locomotion Level    Examination-Participation Restrictions Community Activity;Driving    Stability/Clinical Decision Making Evolving/Moderate complexity    Rehab Potential Good    PT Frequency 2x / week    PT Duration Other (comment)   for 4 weeks, plus 1x/wk (wk of 12/24/2019); total = 5 weeks   PT Treatment/Interventions ADLs/Self Care Home Management;Gait training;Stair training;Functional mobility training;Therapeutic activities;Therapeutic exercise;Balance training;Neuromuscular re-education;Patient/family education    PT Next Visit Plan Gait with cane, standing exercises; try use of aerobic machines for activity level and strength; review updates to HEP (from last visit)    Consulted and Agree with Plan of Care Patient;Family member/caregiver    Family Member Consulted wife           Patient will benefit from skilled therapeutic intervention in order to improve the following deficits and impairments:  Abnormal gait, Difficulty walking, Decreased activity tolerance, Decreased endurance, Decreased balance, Decreased mobility, Decreased strength  Visit Diagnosis: Unsteadiness on feet  Other abnormalities of gait and mobility  Muscle weakness  (generalized)     Problem List Patient Active Problem List   Diagnosis Date Noted  . Acute on chronic renal failure ([place]) 10/14/2019  . Acute ischemic left MCA stroke ([place]) 10/07/2019  . Morbid obesity ([place])   . NSVT (nonsustained ventricular tachycardia) ([place])   . Diabetic peripheral neuropathy ([place])   . Acute ischemic stroke ([place]) 10/04/2019  . Hypoglycemia due to insulin 10/04/2019  . CKD (chronic kidney disease), stage III 10/04/2019  . Dehydration 10/04/2019  . Dyslipidemia 10/04/2019  . Stroke ([place]) 10/04/2019  . Ankle fracture, left 02/29/2016  . Closed left ankle fracture 02/29/2016  . Gout 02/20/2016  . Essential hypertension 02/20/2016    Nysir Fergusson W. 01/15/2020, 3:10 PM  Frazier Butt., PT   [facility] [address] [place], [place], [address] Phone: [phone]   Fax:  [phone]  Name: Ireland Chagnon. MRN: [id number] Date of Birth: [birth year]

## 2020-01-19 ENCOUNTER — Ambulatory Visit: Payer: Medicare PPO | Admitting: Physical Therapy

## 2020-01-19 ENCOUNTER — Ambulatory Visit: Payer: Medicare PPO | Admitting: Occupational Therapy

## 2020-01-19 ENCOUNTER — Encounter: Payer: Self-pay | Admitting: Occupational Therapy

## 2020-01-19 ENCOUNTER — Encounter: Payer: Self-pay | Admitting: Physical Therapy

## 2020-01-19 ENCOUNTER — Other Ambulatory Visit: Payer: Self-pay

## 2020-01-19 VITALS — BP 115/62 | HR 78

## 2020-01-19 VITALS — BP 132/69

## 2020-01-19 DIAGNOSIS — R2689 Other abnormalities of gait and mobility: Secondary | ICD-10-CM

## 2020-01-19 DIAGNOSIS — M6281 Muscle weakness (generalized): Secondary | ICD-10-CM

## 2020-01-19 DIAGNOSIS — R278 Other lack of coordination: Secondary | ICD-10-CM

## 2020-01-19 DIAGNOSIS — R2681 Unsteadiness on feet: Secondary | ICD-10-CM

## 2020-01-19 NOTE — Therapy (Signed)
[facility] [address] [place], [place], [address] Phone: [phone]   Fax:  [phone]  Occupational Therapy Treatment  Patient Details  Name: Nathan Proctor. MRN: [id number] Date of Birth: 20-Apr-1941 Referring Provider (OT): Dr. Letta Pate   Encounter Date: 01/19/2020    Past Medical History:  Diagnosis Date  . Anemia of chronic disease   . Ankle fracture 02/15/2016  . Cancer [facility])    Prostate  . Chronic constipation   . Chronic kidney disease    stage III  . Diabetes mellitus without complication ([place])    type II   . Diabetic peripheral neuropathy ([place])   . Failure to thrive (0-17)   . Fracture of left lower leg   . Gout   . Hyperlipidemia   . Hypertension   . Morbid obesity ([place])   . Unstable gait     Past Surgical History:  Procedure Laterality Date  . ORIF ANKLE FRACTURE Left 02/29/2016   Procedure: OPEN REDUCTION INTERNAL FIXATION (ORIF) ANKLE FRACTURE;  Surgeon: Nicholes Stairs, MD;  Location: [facility];  Service: Orthopedics;  Laterality: Left;  . PROSTATE SURGERY      Vitals:   01/19/20 1238  BP: 132/69     Subjective Assessment - 01/19/20 1238    Subjective  Pt reports mild shoulder pain today    Patient is accompanied by: Family member    Limitations Fall Risk.    Patient Stated Goals to be close to independent    Currently in Pain? Yes    Pain Score 2     Pain Location Shoulder    Pain Orientation Right;Left    Pain Descriptors / Indicators Aching    Pain Type Chronic pain    Pain Onset More than a month ago    Pain Frequency Intermittent    Aggravating Factors  not using arms    Pain Relieving Factors stretching           Treatment; ambulating with close supervision, while performing category generation, min-mod v.c  Copying small peg design for increased fine motor coordination with a cognitive component, removing with in hand manipulation, only min v.c                       OT Education - 01/19/20 1307    Education Details yellow theraband exercises 10-15 reps each, min v.c for proper positioning, recommendation that pt does not drive even with someone if he is feeling foggy headed    Person(s) Educated Patient    Methods Explanation;Demonstration;Handout;Verbal cues    Comprehension Verbalized understanding;Returned demonstration;Verbal cues required               OT Long Term Goals - 01/19/20 1307      OT LONG TERM GOAL #1   Title I with HEP coordination,and proximal UE strength    Time 4    Period Weeks    Status On-going   pt reports doing yellow theraband exercises at home 01/14/20     OT LONG TERM GOAL #2   Title Pt will demonstrate increased RUE fine motor coordination for ADLs as eveidenced by decreasing 9 hole peg test score to 35 secs or less.    Baseline RUe39.97, LUE 28.94    Time 4    Period Weeks    Status Achieved   RUE 29.6 seconds 01/15/20     OT LONG TERM GOAL #3   Title Pt will perfrom a  physical and cognitve task simultaneously with 90% or better accuracy in prep for return to driving and grocery shopping.    Time 4    Period Weeks    Status New                 Plan - 01/19/20 1239    Clinical Impression Statement Pt reports he's feeling a little better today.    Occupational performance deficits (Please refer to evaluation for details): ADL's;IADL's;Leisure;Social Participation    Body Structure / Function / Physical Skills ADL;Gait;Strength;GMC;Dexterity;Balance;UE functional use;ROM;IADL;Endurance;Mobility;Flexibility;Coordination;Decreased knowledge of precautions;FMC;Decreased knowledge of use of DME    Rehab Potential Good    OT Frequency 2x / week    OT Duration 4 weeks    OT Treatment/Interventions Self-care/ADL training;Moist Heat;Fluidtherapy;DME and/or AE instruction;Therapeutic activities;Aquatic Therapy;Gait Training;Ultrasound;Therapeutic exercise    Plan activity  tolerance, physical/cognitive task    Consulted and Agree with Plan of Care Patient;Family member/caregiver           Patient will benefit from skilled therapeutic intervention in order to improve the following deficits and impairments:   Body Structure / Function / Physical Skills: ADL, Gait, Strength, GMC, Dexterity, Balance, UE functional use, ROM, IADL, Endurance, Mobility, Flexibility, Coordination, Decreased knowledge of precautions, FMC, Decreased knowledge of use of DME       Visit Diagnosis: Other lack of coordination  Muscle weakness (generalized)  Unsteadiness on feet  Other abnormalities of gait and mobility    Problem List Patient Active Problem List   Diagnosis Date Noted  . Acute on chronic renal failure ([place]) 10/14/2019  . Acute ischemic left MCA stroke ([place]) 10/07/2019  . Morbid obesity ([place])   . NSVT (nonsustained ventricular tachycardia) ([place])   . Diabetic peripheral neuropathy ([place])   . Acute ischemic stroke ([place]) 10/04/2019  . Hypoglycemia due to insulin 10/04/2019  . CKD (chronic kidney disease), stage III 10/04/2019  . Dehydration 10/04/2019  . Dyslipidemia 10/04/2019  . Stroke ([place]) 10/04/2019  . Ankle fracture, left 02/29/2016  . Closed left ankle fracture 02/29/2016  . Gout 02/20/2016  . Essential hypertension 02/20/2016    Abdifatah Colquhoun 01/19/2020, 1:09 PM  [facility] [address] [place], [place], [address] Phone: [phone]   Fax:  [phone]  Name: Tedric Leeth. MRN: [id number] Date of Birth: 1941/01/17

## 2020-01-21 ENCOUNTER — Encounter: Payer: Self-pay | Admitting: Physical Therapy

## 2020-01-21 ENCOUNTER — Ambulatory Visit: Payer: Medicare PPO | Admitting: Occupational Therapy

## 2020-01-21 ENCOUNTER — Other Ambulatory Visit: Payer: Self-pay

## 2020-01-21 ENCOUNTER — Ambulatory Visit: Payer: Medicare PPO | Admitting: Physical Therapy

## 2020-01-21 VITALS — BP 141/72 | HR 83

## 2020-01-21 DIAGNOSIS — M6281 Muscle weakness (generalized): Secondary | ICD-10-CM

## 2020-01-21 DIAGNOSIS — R2681 Unsteadiness on feet: Secondary | ICD-10-CM

## 2020-01-21 DIAGNOSIS — R2689 Other abnormalities of gait and mobility: Secondary | ICD-10-CM

## 2020-01-21 DIAGNOSIS — R278 Other lack of coordination: Secondary | ICD-10-CM

## 2020-01-21 NOTE — Therapy (Addendum)
[facility] [address] [place], [place], [address] Phone: [phone]   Fax:  [phone]  Physical Therapy Treatment/10th Visit Progress Note  Patient Details  Name: Nathan Proctor. MRN: [id number] Date of Birth: 1941/04/26 Referring Provider (PT): Dr. Letta Pate  10th Visit Progress Note, with date of eval 11/06/2019-01/21/2020.  Pt was on hold for PT for several weeks due to ongoing cardiac issues, and returned to PT earlier this month, cleared by cardiologist to return.  Pt continues to note fatigue with activity, and reports not doing too much at home.  Objective measures:  5x sit<>stand 21. 24 sec, 2.38 ft/sec gt velocity, DGI 15/24.  Pt fatigues with activity in PT sessions, needing seated rest breaks.  Pt is progressing towards LTGs and will benefit from continued skilled PT towards LTGs for improved overall functional mobility and independence.  Mady Haagensen, PT 01/26/20 8:42 AM Phone: [phone] Fax: [phone]    Encounter Date: 01/21/2020   PT End of Session - 01/21/20 1110    Visit Number 10    Number of Visits 14    Date for PT Re-Evaluation 25/95/63   60 day cert, 4 wk POC   Authorization Type [employer]-checking on approval 12/24/2019 visit    Progress Note Due on Visit 10    PT Start Time 1025   pt arrived late for appt   PT Stop Time 1100    PT Time Calculation (min) 35 min    Equipment Utilized During Treatment Gait belt    Activity Tolerance Patient tolerated treatment well;Patient limited by fatigue    Behavior During Therapy WFL for tasks assessed/performed           Past Medical History:  Diagnosis Date  . Anemia of chronic disease   . Ankle fracture 02/15/2016  . Cancer [facility])    Prostate  . Chronic constipation   . Chronic kidney disease    stage III  . Diabetes mellitus without complication ([place])    type II   . Diabetic peripheral neuropathy ([place])   . Failure to thrive  (0-17)   . Fracture of left lower leg   . Gout   . Hyperlipidemia   . Hypertension   . Morbid obesity ([place])   . Unstable gait     Past Surgical History:  Procedure Laterality Date  . ORIF ANKLE FRACTURE Left 02/29/2016   Procedure: OPEN REDUCTION INTERNAL FIXATION (ORIF) ANKLE FRACTURE;  Surgeon: Nicholes Stairs, MD;  [facility];  Service: Orthopedics;  Laterality: Left;  . PROSTATE SURGERY      Vitals:   01/21/20 1031 01/21/20 1044  BP: 139/69 (!) 141/72  Pulse: 78 83     Subjective Assessment - 01/21/20 1029    Subjective Denies any falls or changes.  "I think the lightheadedness is part of the healing process."  Reports still having it with fast turns.    Patient is accompained by: Family member   spouse   Pertinent History diabetes, prostate cancer, CKD 3, diabetic neuropathy, morbid obesity, hypertension, hyperlipidemia, Hx of ankle fracture    Patient Stated Goals Pt's goals for therapy are to improve steadiness.    Currently in Pain? No/denies                             [facility] Adult PT Treatment/Exercise - 01/21/20 0001      Transfers   Transfers Sit to Stand;Stand to Sit  Sit to Stand 5: Supervision;With upper extremity assist;From chair/3-in-1    Stand to Sit 5: Supervision;With upper extremity assist;To chair/3-in-1    Number of Reps 10 reps    Comments from mat with hands on knees      Ambulation/Gait   Ambulation/Gait Yes    Ambulation/Gait Assistance 5: Supervision    Ambulation/Gait Assistance Details in/out of gym    Assistive device Straight cane    Gait Pattern Step-through pattern;Wide base of support;Trendelenburg    Ambulation Surface Level;Indoor      High Level Balance   High Level Balance Activities Marching forwards;Marching backwards;Other (comment)   standing marching   High Level Balance Comments on blue mat in // bars with random intermittent UE support and supervision      Knee/Hip Exercises: Aerobic    Other Aerobic Scifit UE/LE's level 2.5 for 8 minutes with goal rpm 60-65 for strengthening and activity tolerance.        Knee/Hip Exercises: Standing   Knee Flexion Strengthening;Both;2 sets;15 reps;Other (comment)   alternating on blue mat              Balance Exercises - 01/21/20 0001      Balance Exercises: Standing   Standing Eyes Opened Wide (BOA);Foam/compliant surface    Standing Eyes Closed Wide (BOA);Foam/compliant surface    Other Standing Exercises Comments on blue foam mat for eyes open and eyes closed for head turns and head nods and static standing x 10-20 reps x 3 sets each with no UE support needed                  PT Long Term Goals - 01/21/20 0003      PT LONG TERM GOAL #1   Title Pt will be independent with progression of HEP for improved strength, balance, transfers, and gait.  TARGET 02/05/2020 (may be delayed due to delay in scheduling)    Time 5    Period Weeks    Status Revised      PT LONG TERM GOAL #2   Title Pt will improve 5x sit<>stand to less than or equal to 15 seconds to demonstrate improved functional strength and transfer efficiency.    Baseline 12/04/19: 14.50 sec's no UE support from standard height surface; 12/24/19:  21.54    Time 5    Period Weeks    Status Revised      PT LONG TERM GOAL #3   Title Pt will improve DGI score to at least 19/24 to decrease fall risk.    Baseline 12/04/19: 19/24 scored today; had decreased to 15/24 after ED visit/hold from therapy 12/24/2019    Time 5    Period Weeks    Status Revised      PT LONG TERM GOAL #4   Title Pt will improve TUG score to less than or equal to 13.5 seconds for decreased fall risk.    Baseline 12/04/19: 12.41 sec's no AD; after ED visit/hold from therapy 18.63 sec 12/24/2019    Time 5    Period Weeks    Status Revised      PT LONG TERM GOAL #5   Title Pt will improve gait velocity to at least 2.62 ft/sec for improved gait effiiciency for community ambulation.    Baseline  12/04/19: 2.98 ft/sed no AD, decreased to 2.38 ft/sec 12/24/2019    Time 5    Period Weeks    Status On-going      PT LONG TERM GOAL #6  Title Pt/wife will verbalize understanding of plans for transition to community fitness upon d/c from PT, to maximize gains made in therapy.    Time 5    Period Weeks    Status New                 Plan - 01/21/20 1111    Clinical Impression Statement Skilled session focused on balance, endurance and strengthening.  Pt needed rest breaks during session.  Vitals stable during session.  Cont per poc.    Personal Factors and Comorbidities Comorbidity 3+    Comorbidities PMH: diabetes, prostate cancer, CKD 3, diabetic neuropathy, morbid obesity, hypertension, hyperlipidemia    Examination-Activity Limitations Stand;Transfers;Locomotion Level    Examination-Participation Restrictions Community Activity;Driving    Stability/Clinical Decision Making Evolving/Moderate complexity    Rehab Potential Good    PT Frequency 2x / week    PT Duration Other (comment)   for 4 weeks, plus 1x/wk (wk of 12/24/2019); total = 5 weeks   PT Treatment/Interventions ADLs/Self Care Home Management;Gait training;Stair training;Functional mobility training;Therapeutic activities;Therapeutic exercise;Balance training;Neuromuscular re-education;Patient/family education    PT Next Visit Plan Gait with cane, standing exercises; try use of aerobic machines for activity level and strength; review updates to HEP (from last visit)    Consulted and Agree with Plan of Care Patient;Family member/caregiver    Family Member Consulted wife           Patient will benefit from skilled therapeutic intervention in order to improve the following deficits and impairments:  Abnormal gait, Difficulty walking, Decreased activity tolerance, Decreased endurance, Decreased balance, Decreased mobility, Decreased strength  Visit Diagnosis: Other abnormalities of gait and mobility  Unsteadiness on  feet  Muscle weakness (generalized)     Problem List Patient Active Problem List   Diagnosis Date Noted  . Acute on chronic renal failure ([place]) 10/14/2019  . Acute ischemic left MCA stroke ([place]) 10/07/2019  . Morbid obesity ([place])   . NSVT (nonsustained ventricular tachycardia) ([place])   . Diabetic peripheral neuropathy ([place])   . Acute ischemic stroke ([place]) 10/04/2019  . Hypoglycemia due to insulin 10/04/2019  . CKD (chronic kidney disease), stage III 10/04/2019  . Dehydration 10/04/2019  . Dyslipidemia 10/04/2019  . Stroke ([place]) 10/04/2019  . Ankle fracture, left 02/29/2016  . Closed left ankle fracture 02/29/2016  . Gout 02/20/2016  . Essential hypertension 02/20/2016    Narda Bonds, PTA [facility] 01/21/20 11:14 AM Phone: [phone] Fax: [facility] [address] [place], [place], [address] Phone: [phone]   Fax:  [phone]  Name: Nathan Proctor. MRN: [id number] Date of Birth: [birth year]

## 2020-01-21 NOTE — Telephone Encounter (Signed)
Left message to call back on home and mobile numbers. 

## 2020-01-21 NOTE — Therapy (Signed)
[facility] [address] [place], [place], [address] Phone: [phone]   Fax:  [phone]  Occupational Therapy Treatment  Patient Details  Name: Nathan Proctor. MRN: [id number] Date of Birth: 08-14-1940 Referring Provider (OT): Dr. Letta Pate   Encounter Date: 01/21/2020   OT End of Session - 01/21/20 1157    Visit Number 7    Number of Visits 9    Date for OT Re-Evaluation 12/06/19    Authorization Type [employer]    Authorization Time Period 90 days    OT Start Time 1105    OT Stop Time 1145    OT Time Calculation (min) 40 min    Activity Tolerance Patient tolerated treatment well    Behavior During Therapy [facility] for tasks assessed/performed           Past Medical History:  Diagnosis Date   Anemia of chronic disease    Ankle fracture 02/15/2016   Cancer ([place])    Prostate   Chronic constipation    Chronic kidney disease    stage III   Diabetes mellitus without complication ([place])    type II    Diabetic peripheral neuropathy ([place])    Failure to thrive (0-17)    Fracture of left lower leg    Gout    Hyperlipidemia    Hypertension    Morbid obesity ([place])    Unstable gait     Past Surgical History:  Procedure Laterality Date   ORIF ANKLE FRACTURE Left 02/29/2016   Procedure: OPEN REDUCTION INTERNAL FIXATION (ORIF) ANKLE FRACTURE;  Surgeon: Nicholes Stairs, MD;  [facility];  Service: Orthopedics;  Laterality: Left;   PROSTATE SURGERY      There were no vitals filed for this visit.   Subjective Assessment - 01/21/20 1106    Subjective  My shoulder pain goes and comes    Patient is accompanied by: Family member    Limitations Fall Risk.    Patient Stated Goals to be close to independent    Currently in Pain? No/denies    Pain Onset More than a month ago          Pt ambulating 300+ ft while subtracting by 3's for activity tolerance and dual tasking with mod  difficulty and errors in subtractions and increased HR up to 98 after task w/ slight SOB, no decrease in O2 sats.  Pt encouraged to start grocery shopping with wife for 10 items or less to gradually build up endurance. Pt reports he is either staying in car or using scooter at this time.  Pt copying complex peg design with small pegs Rt hand with only 1 small error.  Pt then ambulated 200+ ft for category generation with mod cues for cognitive component.                            OT Long Term Goals - 01/21/20 1121      OT LONG TERM GOAL #1   Title I with HEP coordination,and proximal UE strength    Time 4    Period Weeks    Status Achieved   pt reports doing yellow theraband exercises at home 01/14/20     OT LONG TERM GOAL #2   Title Pt will demonstrate increased RUE fine motor coordination for ADLs as eveidenced by decreasing 9 hole peg test score to 35 secs or less.    Baseline RUe39.97, LUE  28.94    Time 4    Period Weeks    Status Achieved   RUE 29.6 seconds 01/15/20     OT LONG TERM GOAL #3   Title Pt will perfrom a physical and cognitve task simultaneously with 90% or better accuracy in prep for return to driving and grocery shopping.    Time 4    Period Weeks    Status On-going                 Plan - 01/21/20 1158    Clinical Impression Statement Pt has met LTG's #1 and #2. Pt continues to struggle with endurance and divided attention b/t physical and cognitive tasks.    Occupational performance deficits (Please refer to evaluation for details): ADL's;IADL's;Leisure;Social Participation    Body Structure / Function / Physical Skills ADL;Gait;Strength;GMC;Dexterity;Balance;UE functional use;ROM;IADL;Endurance;Mobility;Flexibility;Coordination;Decreased knowledge of precautions;FMC;Decreased knowledge of use of DME    Rehab Potential Good    OT Frequency 2x / week    OT Duration 4 weeks    OT Treatment/Interventions Self-care/ADL training;Moist  Heat;Fluidtherapy;DME and/or AE instruction;Therapeutic activities;Aquatic Therapy;Gait Training;Ultrasound;Therapeutic exercise    Plan continue activity tolerance, physical/cognitive task, begin discussion of renewal or d/c    Consulted and Agree with Plan of Care Patient;Family member/caregiver           Patient will benefit from skilled therapeutic intervention in order to improve the following deficits and impairments:   Body Structure / Function / Physical Skills: ADL, Gait, Strength, GMC, Dexterity, Balance, UE functional use, ROM, IADL, Endurance, Mobility, Flexibility, Coordination, Decreased knowledge of precautions, FMC, Decreased knowledge of use of DME       Visit Diagnosis: Muscle weakness (generalized)  Other lack of coordination  Unsteadiness on feet    Problem List Patient Active Problem List   Diagnosis Date Noted   Acute on chronic renal failure ([place]) 10/14/2019   Acute ischemic left MCA stroke ([place]) 10/07/2019   Morbid obesity ([place])    NSVT (nonsustained ventricular tachycardia) ([address])    Diabetic peripheral neuropathy ([place])    Acute ischemic stroke ([place]) 10/04/2019   Hypoglycemia due to insulin 10/04/2019   CKD (chronic kidney disease), stage III 10/04/2019   Dehydration 10/04/2019   Dyslipidemia 10/04/2019   Stroke ([place]) 10/04/2019   Ankle fracture, left 02/29/2016   Closed left ankle fracture 02/29/2016   Gout 02/20/2016   Essential hypertension 02/20/2016    Carey Bullocks, OTR/L 01/21/2020, 1:08 PM  [facility] [address] [place], [place], [address] Phone: [phone]   Fax:  [phone]  Name: Nathan Proctor. MRN: [id number] Date of Birth: [birth year]

## 2020-01-21 NOTE — Therapy (Signed)
[facility] [address] [place], [place], [address] Phone: [phone]   Fax:  [phone]  Physical Therapy Treatment  Patient Details  Name: Nathan Proctor. MRN: [id number] Date of Birth: [birth year] Referring Provider (PT): Dr. Letta Pate   Encounter Date: 01/19/2020     01/19/20 1318  PT Visits / Re-Eval  Visit Number 9  Number of Visits 14  Date for PT Re-Evaluation 25/05/39 (60 day cert, 4 wk POC)  Authorization  Authorization Type [employer]-checking on approval 12/24/2019 visit  Progress Note Due on Visit 10  PT Time Calculation  PT Start Time 1317  PT Stop Time 1357  PT Time Calculation (min) 40 min  PT - End of Session  Equipment Utilized During Treatment Gait belt  Activity Tolerance Patient tolerated treatment well;Patient limited by fatigue  Behavior During Therapy WFL for tasks assessed/performed    Past Medical History:  Diagnosis Date  . Anemia of chronic disease   . Ankle fracture 02/15/2016  . Cancer [facility])    Prostate  . Chronic constipation   . Chronic kidney disease    stage III  . Diabetes mellitus without complication ([place])    type II   . Diabetic peripheral neuropathy ([place])   . Failure to thrive (0-17)   . Fracture of left lower leg   . Gout   . Hyperlipidemia   . Hypertension   . Morbid obesity ([place])   . Unstable gait     Past Surgical History:  Procedure Laterality Date  . ORIF ANKLE FRACTURE Left 02/29/2016   Procedure: OPEN REDUCTION INTERNAL FIXATION (ORIF) ANKLE FRACTURE;  Surgeon: Nicholes Stairs, MD;  Location: [facility];  Service: Orthopedics;  Laterality: Left;  . PROSTATE SURGERY      Vitals:   01/19/20 1316  BP: 115/62  Pulse: 78  SpO2: 97%     01/19/20 1316  Symptoms/Limitations  Subjective Still getting some "fogginess" when he turns over, not all the time. Goes away quickly. No falls or pain to report. Shoulder was sore with OT session  prior to PT, no pain or soreness now.  Patient is accompained by: Family member (spouse)  Pertinent History diabetes, prostate cancer, CKD 3, diabetic neuropathy, morbid obesity, hypertension, hyperlipidemia, Hx of ankle fracture  Patient Stated Goals Pt's goals for therapy are to improve steadiness.  Pain Assessment  Currently in Pain? No/denies  Pain Score 0       01/19/20 1321  Transfers  Transfers Sit to Stand;Stand to Sit  Sit to Stand 5: Supervision;With upper extremity assist;From chair/3-in-1  Stand to Sit 5: Supervision;With upper extremity assist;To chair/3-in-1  Ambulation/Gait  Ambulation/Gait Yes  Ambulation/Gait Assistance 5: Supervision  Ambulation/Gait Assistance Details around gym with session  Assistive device Straight cane  Gait Pattern Step-through pattern;Wide base of support;Trendelenburg  Ambulation Surface Level;Indoor  High Level Balance  High Level Balance Activities Side stepping;Marching forwards;Marching backwards;Tandem walking (tandem gait fwd/bwd)  High Level Balance Comments on blue mat in parallel bars: 3 laps each/each way with light UE support on bars, cues on posture and ex form/technique. min guard assist.   Exercises  Exercises Other Exercises  Other Exercises  seated at edge of mat: sit<>stands with hands on knees for 10 reps, cues for controlled descent with sitting down.   Knee/Hip Exercises: Aerobic  Other Aerobic Scifit UE/LE's level 2.5 for 8 minutes with goal rpm 60-65 for strengthening and activity tolerance.  HR 86 afterwards with rest break needed.  01/19/20 1346  Balance Exercises: Standing  Standing Eyes Closed Wide (BOA);Head turns;Foam/compliant surface;Other reps (comment);30 secs;Limitations  Standing Eyes Closed Limitations on airex with no UE support, occasional touch to bars- EC 30 sec's x 3 reps, progressing to EC head movements left<>right, then up<>down for 10 reps. min assist for balance with cues on posture and  weight shifting to assist with balance.       PT Long Term Goals - 01/21/20 0003      PT LONG TERM GOAL #1   Title Pt will be independent with progression of HEP for improved strength, balance, transfers, and gait.  TARGET 02/05/2020 (may be delayed due to delay in scheduling)    Time 5    Period Weeks    Status Revised      PT LONG TERM GOAL #2   Title Pt will improve 5x sit<>stand to less than or equal to 15 seconds to demonstrate improved functional strength and transfer efficiency.    Baseline 12/04/19: 14.50 sec's no UE support from standard height surface; 12/24/19:  21.54    Time 5    Period Weeks    Status Revised      PT LONG TERM GOAL #3   Title Pt will improve DGI score to at least 19/24 to decrease fall risk.    Baseline 12/04/19: 19/24 scored today; had decreased to 15/24 after ED visit/hold from therapy 12/24/2019    Time 5    Period Weeks    Status Revised      PT LONG TERM GOAL #4   Title Pt will improve TUG score to less than or equal to 13.5 seconds for decreased fall risk.    Baseline 12/04/19: 12.41 sec's no AD; after ED visit/hold from therapy 18.63 sec 12/24/2019    Time 5    Period Weeks    Status Revised      PT LONG TERM GOAL #5   Title Pt will improve gait velocity to at least 2.62 ft/sec for improved gait effiiciency for community ambulation.    Baseline 12/04/19: 2.98 ft/sed no AD, decreased to 2.38 ft/sec 12/24/2019    Time 5    Period Weeks    Status On-going      PT LONG TERM GOAL #6   Title Pt/wife will verbalize understanding of plans for transition to community fitness upon d/c from PT, to maximize gains made in therapy.    Time 5    Period Weeks    Status New               01/19/20 1319  Plan  Clinical Impression Statement Today's sklled session continued to focus on strengthening, activity tolerance and balance with rest breaks taken as needed. No other issues noted or reported with session. The pt is progressing toward goals and should  benefit from continued PT. Of note, this is week 3 of 5 in plan of care from recert. LTGs due date updated to reflect this.  Personal Factors and Comorbidities Comorbidity 3+  Comorbidities PMH: diabetes, prostate cancer, CKD 3, diabetic neuropathy, morbid obesity, hypertension, hyperlipidemia  Examination-Activity Limitations Stand;Transfers;Locomotion Level  Examination-Participation Restrictions Community Activity;Driving  Pt will benefit from skilled therapeutic intervention in order to improve on the following deficits Abnormal gait;Difficulty walking;Decreased activity tolerance;Decreased endurance;Decreased balance;Decreased mobility;Decreased strength  Stability/Clinical Decision Making Evolving/Moderate complexity  Rehab Potential Good  PT Frequency 2x / week  PT Duration Other (comment) (for 4 weeks, plus 1x/wk (wk of 12/24/2019); total = 5 weeks)  PT  Treatment/Interventions ADLs/Self Care Home Management;Gait training;Stair training;Functional mobility training;Therapeutic activities;Therapeutic exercise;Balance training;Neuromuscular re-education;Patient/family education  PT Next Visit Plan 10th visist progress note due next visit; Gait with cane, standing exercises; try use of aerobic machines for activity level and strength; review updates to HEP (from last visit)  Consulted and Agree with Plan of Care Patient;Family member/caregiver  Family Member Consulted wife        Patient will benefit from skilled therapeutic intervention in order to improve the following deficits and impairments:  Abnormal gait, Difficulty walking, Decreased activity tolerance, Decreased endurance, Decreased balance, Decreased mobility, Decreased strength  Visit Diagnosis: Muscle weakness (generalized)  Unsteadiness on feet  Other abnormalities of gait and mobility     Problem List Patient Active Problem List   Diagnosis Date Noted  . Acute on chronic renal failure ([place]) 10/14/2019  . Acute  ischemic left MCA stroke ([place]) 10/07/2019  . Morbid obesity ([place])   . NSVT (nonsustained ventricular tachycardia) ([place])   . Diabetic peripheral neuropathy ([place])   . Acute ischemic stroke ([place]) 10/04/2019  . Hypoglycemia due to insulin 10/04/2019  . CKD (chronic kidney disease), stage III 10/04/2019  . Dehydration 10/04/2019  . Dyslipidemia 10/04/2019  . Stroke ([place]) 10/04/2019  . Ankle fracture, left 02/29/2016  . Closed left ankle fracture 02/29/2016  . Gout 02/20/2016  . Essential hypertension 02/20/2016    Willow Ora, PTA, [facility] [address] [place], [place] [address] [phone] 01/21/20, 12:04 AM   Name: Ojani Berenson. MRN: [id number] Date of Birth: Jul 30, 1940

## 2020-01-26 ENCOUNTER — Ambulatory Visit: Payer: Medicare PPO | Admitting: Physical Therapy

## 2020-01-26 ENCOUNTER — Other Ambulatory Visit: Payer: Self-pay

## 2020-01-26 ENCOUNTER — Encounter: Payer: Self-pay | Admitting: Occupational Therapy

## 2020-01-26 ENCOUNTER — Ambulatory Visit: Payer: Medicare PPO | Admitting: Occupational Therapy

## 2020-01-26 VITALS — BP 140/65 | HR 80

## 2020-01-26 DIAGNOSIS — R278 Other lack of coordination: Secondary | ICD-10-CM

## 2020-01-26 DIAGNOSIS — R2681 Unsteadiness on feet: Secondary | ICD-10-CM

## 2020-01-26 DIAGNOSIS — M6281 Muscle weakness (generalized): Secondary | ICD-10-CM

## 2020-01-26 DIAGNOSIS — R2689 Other abnormalities of gait and mobility: Secondary | ICD-10-CM

## 2020-01-26 NOTE — Therapy (Signed)
[facility] [address] [place], [place], [address] Phone: [phone]   Fax:  [phone]  Occupational Therapy Treatment  Patient Details  Name: Nathan Proctor. MRN: [id number] Date of Birth: [birth year] Referring Provider (OT): Dr. Letta Pate   Encounter Date: 01/26/2020   OT End of Session - 01/26/20 1319    Visit Number 8    Number of Visits 9    Date for OT Re-Evaluation 12/06/19    Authorization Type [employer] [facility]    Authorization Time Period 90 days    OT Start Time 1316    OT Stop Time 1400    OT Time Calculation (min) 44 min    Activity Tolerance Patient tolerated treatment well    Behavior During Therapy WFL for tasks assessed/performed           Past Medical History:  Diagnosis Date  . Anemia of chronic disease   . Ankle fracture 02/15/2016  . Cancer [facility])    Prostate  . Chronic constipation   . Chronic kidney disease    stage III  . Diabetes mellitus without complication ([place])    type II   . Diabetic peripheral neuropathy ([place])   . Failure to thrive (0-17)   . Fracture of left lower leg   . Gout   . Hyperlipidemia   . Hypertension   . Morbid obesity ([place])   . Unstable gait     Past Surgical History:  Procedure Laterality Date  . ORIF ANKLE FRACTURE Left 02/29/2016   Procedure: OPEN REDUCTION INTERNAL FIXATION (ORIF) ANKLE FRACTURE;  Surgeon: Nicholes Stairs, MD;  [facility];  Service: Orthopedics;  Laterality: Left;  . PROSTATE SURGERY      There were no vitals filed for this visit.   Subjective Assessment - 01/26/20 1316    Subjective  Pt denies any pain. Says he is doing "a little better". Pt reports fall yesterday trying to help move a piece of heavy equipment from a truck. Pt reports RUE shoulder took the brunt of the fall but does not have any pain.    Patient is accompanied by: Family member    Limitations Fall Risk.    Patient Stated Goals to be close to  independent    Currently in Pain? No/denies             Treatment: Reviewed Goals   Environmental Scanning in moderately distracting environment 75% accuracy - required seated break after approximately 3 minutes, after 1 minute and again after 1 minute. SpO2 99% but appeared and sounded SOB.    PVC Pipe tree - bimanual coordination and cognitive development w/ visual perceptual skills. Moderate verbal and visual cues for following pattern.   Brewing technologist for increase in endurance and activity tolerance. Patient placed 6 rubber washers with RUE and removed with LUE with min fatigue.       OT Long Term Goals - 01/26/20 1502      OT LONG TERM GOAL #1   Title I with HEP coordination,and proximal UE strength    Time 4    Period Weeks    Status Achieved   pt reports doing yellow theraband exercises at home 01/14/20     OT LONG TERM GOAL #2   Title Pt will demonstrate increased RUE fine motor coordination for ADLs as eveidenced by decreasing 9 hole peg test score to 35 secs or less.    Baseline RUe39.97, LUE 28.94    Time 4  Period Weeks    Status Achieved   RUE 29.6 seconds 01/15/20     OT LONG TERM GOAL #3   Title Pt will perfrom a physical and cognitve task simultaneously with 90% or better accuracy in prep for return to driving and grocery shopping.    Time 2    Period Weeks    Status On-going   75% accuracy 01/26/20     OT LONG TERM GOAL #4   Title Pt will be independent with updated HEP for strengthening and endurance. 02/09/20    Time 2    Period Weeks    Status New    Target Date 02/09/20      OT LONG TERM GOAL #5   Title Pt will tolerate 5 minutes walking or 10 minutes of standing for increased activity tolerance in order to prep for ADLs and IADLs.    Time 2    Period Weeks    Status New                 Plan - 01/26/20 1323    Clinical Impression Statement Pt has met 2/3 LTGs and additional goals have been added for targeting updating HEP. Pt  continues to present with decreased activity tolerance and deficits in cognition. Continuing to work towards increasing activity tolerance and endurance. Skilled OT is recommended to target remaining goals of HEP and increasing endurance to increase safety and independence for ADLs and IADLs.    OT Occupational Profile and History Detailed Assessment- Review of Records and additional review of physical, cognitive, psychosocial history related to current functional performance    Occupational performance deficits (Please refer to evaluation for details): ADL's;IADL's;Leisure;Social Participation    Body Structure / Function / Physical Skills ADL;Gait;Strength;GMC;Dexterity;Balance;UE functional use;ROM;IADL;Endurance;Mobility;Flexibility;Coordination;Decreased knowledge of precautions;FMC;Decreased knowledge of use of DME    Rehab Potential Good    OT Frequency 2x / week    OT Duration 2 weeks    OT Treatment/Interventions Self-care/ADL training;Moist Heat;Fluidtherapy;DME and/or AE instruction;Therapeutic activities;Aquatic Therapy;Gait Training;Ultrasound;Therapeutic exercise    Plan update HEP, activity tolerance, dual tasking    Consulted and Agree with Plan of Care Patient           Patient will benefit from skilled therapeutic intervention in order to improve the following deficits and impairments:   Body Structure / Function / Physical Skills: ADL, Gait, Strength, GMC, Dexterity, Balance, UE functional use, ROM, IADL, Endurance, Mobility, Flexibility, Coordination, Decreased knowledge of precautions, FMC, Decreased knowledge of use of DME       Visit Diagnosis: Muscle weakness (generalized) - Plan: Ot plan of care cert/re-cert, Ot plan of care cert/re-cert  Other lack of coordination - Plan: Ot plan of care cert/re-cert, Ot plan of care cert/re-cert  Unsteadiness on feet - Plan: Ot plan of care cert/re-cert, Ot plan of care cert/re-cert  Other abnormalities of gait and mobility -  Plan: Ot plan of care cert/re-cert, Ot plan of care cert/re-cert    Problem List Patient Active Problem List   Diagnosis Date Noted  . Acute on chronic renal failure ([place]) 10/14/2019  . Acute ischemic left MCA stroke ([place]) 10/07/2019  . Morbid obesity ([place])   . NSVT (nonsustained ventricular tachycardia) ([place])   . Diabetic peripheral neuropathy ([place])   . Acute ischemic stroke ([place]) 10/04/2019  . Hypoglycemia due to insulin 10/04/2019  . CKD (chronic kidney disease), stage III 10/04/2019  . Dehydration 10/04/2019  . Dyslipidemia 10/04/2019  . Stroke ([place]) 10/04/2019  . Ankle fracture, left 02/29/2016  .  Closed left ankle fracture 02/29/2016  . Gout 02/20/2016  . Essential hypertension 02/20/2016    Zachery Conch MOT, OTR/L  01/26/2020, 3:18 PM  [facility] [address] [place], [place], [address] Phone: [phone]   Fax:  [phone]  Name: Ilia Engelbert. MRN: [id number] Date of Birth: 09/09/1940

## 2020-01-27 NOTE — Therapy (Signed)
[facility] [address] [place], [place], [address] Phone: [phone]   Fax:  [phone]  Physical Therapy Treatment  Patient Details  Name: Nathan Proctor. MRN: [id number] Date of Birth: 1941-03-10 Referring Provider (PT): Dr. Letta Pate   Encounter Date: 01/26/2020   PT End of Session - 01/27/20 1901    Visit Number 11    Number of Visits 14    Date for PT Re-Evaluation 51/76/16   60 day cert, 4 wk POC   Authorization Type [employer]-checking on approval 12/24/2019 visit    PT Start Time 1234    PT Stop Time 1315    PT Time Calculation (min) 41 min    Equipment Utilized During Treatment Gait belt    Activity Tolerance Patient tolerated treatment well;Patient limited by fatigue   requests seated rest breaks throughout   Behavior During Therapy [facility] for tasks assessed/performed           Past Medical History:  Diagnosis Date   Anemia of chronic disease    Ankle fracture 02/15/2016   Cancer ([place])    Prostate   Chronic constipation    Chronic kidney disease    stage III   Diabetes mellitus without complication ([place])    type II    Diabetic peripheral neuropathy ([place])    Failure to thrive (0-17)    Fracture of left lower leg    Gout    Hyperlipidemia    Hypertension    Morbid obesity ([place])    Unstable gait     Past Surgical History:  Procedure Laterality Date   ORIF ANKLE FRACTURE Left 02/29/2016   Procedure: OPEN REDUCTION INTERNAL FIXATION (ORIF) ANKLE FRACTURE;  Surgeon: Nicholes Stairs, MD;  [facility];  Service: Orthopedics;  Laterality: Left;   PROSTATE SURGERY      Vitals:   01/26/20 1254 01/26/20 1312  BP: (!) 144/72 140/65  Pulse: 86 80  SpO2: 96% 97%     Subjective Assessment - 01/26/20 1237    Subjective Had a little tumble yesterday, trying to pick up something too heavy from the back of the truck.  It was heavier that I thought, so I didn't get  hurt.  Took a while to get me up.    Patient is accompained by: Family member   spouse   Pertinent History diabetes, prostate cancer, CKD 3, diabetic neuropathy, morbid obesity, hypertension, hyperlipidemia, Hx of ankle fracture    Patient Stated Goals Pt's goals for therapy are to improve steadiness.    Currently in Pain? No/denies                             [facility] Adult PT Treatment/Exercise - 01/26/20 1235      Transfers   Transfers Sit to Stand;Stand to Sit    Sit to Stand 5: Supervision;Without upper extremity assist;From chair/3-in-1    Five time sit to stand comments  16.88    Stand to Sit 5: Supervision;Without upper extremity assist;To chair/3-in-1      Ambulation/Gait   Ambulation/Gait Yes    Ambulation/Gait Assistance 5: Supervision    Ambulation Distance (Feet) 115 Feet   20 ft x 4; 80 ft   Assistive device Straight cane    Gait Pattern Step-through pattern;Wide base of support;Trendelenburg    Ambulation Surface Level;Indoor    Gait velocity 12.91 sec = 2.54 ft/sec      Standardized Balance Assessment  Standardized Balance Assessment Dynamic Gait Index      Dynamic Gait Index   Level Surface Mild Impairment    Change in Gait Speed Mild Impairment    Gait with Horizontal Head Turns Mild Impairment    Gait with Vertical Head Turns Mild Impairment    Gait and Pivot Turn Mild Impairment    Step Over Obstacle Moderate Impairment    Step Around Obstacles Mild Impairment    Steps Mild Impairment    Total Score 15               Balance Exercises - 01/26/20 1240      Balance Exercises: Standing   Tandem Gait Forward;Retro;3 reps;Upper extremity support    Retro Gait Upper extremity support;3 reps   forward/back walking at counter   Marching Upper extremity assist 1;Solid surface;Dynamic;Forwards   4 reps along counter        REviewed previous HEP, with pt return demo understanding: Side Stepping with Counter Support - 1 x daily - 5 x  weekly - 3 sets - 10 reps Alternating Step Taps with Counter Support - 1 x daily - 5 x weekly - 2-3 sets - 10 reps    PT Education - 01/27/20 1901    Education Details Discussed progress towards goals, continued fatigue level limitations with activity; possible plans for d/c next week    Person(s) Educated Patient;Spouse    Methods Explanation    Comprehension Verbalized understanding               PT Long Term Goals - 01/21/20 0003      PT LONG TERM GOAL #1   Title Pt will be independent with progression of HEP for improved strength, balance, transfers, and gait.  TARGET 02/05/2020 (may be delayed due to delay in scheduling)    Time 5    Period Weeks    Status Revised      PT LONG TERM GOAL #2   Title Pt will improve 5x sit<>stand to less than or equal to 15 seconds to demonstrate improved functional strength and transfer efficiency.    Baseline 12/04/19: 14.50 sec's no UE support from standard height surface; 12/24/19:  21.54    Time 5    Period Weeks    Status Revised      PT LONG TERM GOAL #3   Title Pt will improve DGI score to at least 19/24 to decrease fall risk.    Baseline 12/04/19: 19/24 scored today; had decreased to 15/24 after ED visit/hold from therapy 12/24/2019    Time 5    Period Weeks    Status Revised      PT LONG TERM GOAL #4   Title Pt will improve TUG score to less than or equal to 13.5 seconds for decreased fall risk.    Baseline 12/04/19: 12.41 sec's no AD; after ED visit/hold from therapy 18.63 sec 12/24/2019    Time 5    Period Weeks    Status Revised      PT LONG TERM GOAL #5   Title Pt will improve gait velocity to at least 2.62 ft/sec for improved gait effiiciency for community ambulation.    Baseline 12/04/19: 2.98 ft/sed no AD, decreased to 2.38 ft/sec 12/24/2019    Time 5    Period Weeks    Status On-going      PT LONG TERM GOAL #6   Title Pt/wife will verbalize understanding of plans for transition to community fitness upon d/c from PT,  to  maximize gains made in therapy.    Time 5    Period Weeks    Status New                 Plan - 01/27/20 1902    Clinical Impression Statement Vitals monitored throughout session and stable; pt SOB with standing and gait activities, needing seated rest breaks throughout.  Began looking at functional meausres with improved 5x sit<>stand measure, improved gait velocity; DGI score unchanged.  Pt's fatigue level seems to be limiting further progression with therapy at this time; likely planning for discharge next week.    Personal Factors and Comorbidities Comorbidity 3+    Comorbidities PMH: diabetes, prostate cancer, CKD 3, diabetic neuropathy, morbid obesity, hypertension, hyperlipidemia    Examination-Activity Limitations Stand;Transfers;Locomotion Level    Examination-Participation Restrictions Community Activity;Driving    Stability/Clinical Decision Making Evolving/Moderate complexity    Rehab Potential Good    PT Frequency 2x / week    PT Duration Other (comment)   for 4 weeks, plus 1x/wk (wk of 12/24/2019); total = 5 weeks   PT Treatment/Interventions ADLs/Self Care Home Management;Gait training;Stair training;Functional mobility training;Therapeutic activities;Therapeutic exercise;Balance training;Neuromuscular re-education;Patient/family education    PT Next Visit Plan Update HEP with dynamic counter exercises; discuss possible return to gym/[facility] plans and likely plan for d/c next week    Consulted and Agree with Plan of Care Patient;Family member/caregiver    Family Member Consulted wife           Patient will benefit from skilled therapeutic intervention in order to improve the following deficits and impairments:  Abnormal gait, Difficulty walking, Decreased activity tolerance, Decreased endurance, Decreased balance, Decreased mobility, Decreased strength  Visit Diagnosis: Other abnormalities of gait and mobility  Unsteadiness on feet  Muscle weakness  (generalized)     Problem List Patient Active Problem List   Diagnosis Date Noted   Acute on chronic renal failure ([place]) 10/14/2019   Acute ischemic left MCA stroke ([place]) 10/07/2019   Morbid obesity ([place])    NSVT (nonsustained ventricular tachycardia) ([place])    Diabetic peripheral neuropathy ([place])    Acute ischemic stroke ([place]) 10/04/2019   Hypoglycemia due to insulin 10/04/2019   CKD (chronic kidney disease), stage III 10/04/2019   Dehydration 10/04/2019   Dyslipidemia 10/04/2019   Stroke ([place]) 10/04/2019   Ankle fracture, left 02/29/2016   Closed left ankle fracture 02/29/2016   Gout 02/20/2016   Essential hypertension 02/20/2016    Nasya Vincent W. 01/27/2020, 7:06 PM Frazier Butt., PT  [facility] [address] [place], [place], [address] Phone: [phone]   Fax:  [phone]  Name: Nathan Proctor. MRN: [id number] Date of Birth: 27-Feb-1941

## 2020-01-28 ENCOUNTER — Other Ambulatory Visit: Payer: Self-pay

## 2020-01-28 ENCOUNTER — Encounter: Payer: Self-pay | Admitting: Occupational Therapy

## 2020-01-28 ENCOUNTER — Ambulatory Visit: Payer: Medicare PPO

## 2020-01-28 ENCOUNTER — Ambulatory Visit: Payer: Medicare PPO | Admitting: Occupational Therapy

## 2020-01-28 DIAGNOSIS — R2689 Other abnormalities of gait and mobility: Secondary | ICD-10-CM

## 2020-01-28 DIAGNOSIS — R278 Other lack of coordination: Secondary | ICD-10-CM

## 2020-01-28 DIAGNOSIS — R2681 Unsteadiness on feet: Secondary | ICD-10-CM

## 2020-01-28 DIAGNOSIS — I63512 Cerebral infarction due to unspecified occlusion or stenosis of left middle cerebral artery: Secondary | ICD-10-CM

## 2020-01-28 DIAGNOSIS — M6281 Muscle weakness (generalized): Secondary | ICD-10-CM

## 2020-01-28 NOTE — Telephone Encounter (Signed)
Forwarding to Dr. Harrington Challenger to make aware - patient has not returned my calls to schedule heart catheterization.  Also noted in chart, EP is recommending a loop recorder and have been unable to reach patient to arrange appointment for this. A letter was sent on 01/05/20 to call office.

## 2020-01-28 NOTE — Therapy (Signed)
[facility] [address] [place], [place], [address] Phone: [phone]   Fax:  [phone]  Occupational Therapy Treatment  Patient Details  Name: Nathan Proctor. MRN: [id number] Date of Birth: [birth year] Referring Provider (OT): Dr. Letta Pate   Encounter Date: 01/28/2020   OT End of Session - 01/28/20 1404    Visit Number 9    Number of Visits 12    Date for OT Re-Evaluation 02/11/20    Authorization Type [employer] [facility]    Authorization Time Period 90 days    OT Start Time 1404    OT Stop Time 1445    OT Time Calculation (min) 41 min    Activity Tolerance Patient tolerated treatment well    Behavior During Therapy WFL for tasks assessed/performed           Past Medical History:  Diagnosis Date  . Anemia of chronic disease   . Ankle fracture 02/15/2016  . Cancer [facility])    Prostate  . Chronic constipation   . Chronic kidney disease    stage III  . Diabetes mellitus without complication ([place])    type II   . Diabetic peripheral neuropathy ([place])   . Failure to thrive (0-17)   . Fracture of left lower leg   . Gout   . Hyperlipidemia   . Hypertension   . Morbid obesity ([place])   . Unstable gait     Past Surgical History:  Procedure Laterality Date  . ORIF ANKLE FRACTURE Left 02/29/2016   Procedure: OPEN REDUCTION INTERNAL FIXATION (ORIF) ANKLE FRACTURE;  Surgeon: Nicholes Stairs, MD;  Location: [facility];  Service: Orthopedics;  Laterality: Left;  . PROSTATE SURGERY      There were no vitals filed for this visit.   Subjective Assessment - 01/28/20 1406    Subjective  Pt says his shoulder was hurting before but he put some lotion on it and it is currently not in pain.    Patient is accompanied by: Family member    Limitations Fall Risk.    Patient Stated Goals to be close to independent    Currently in Pain? No/denies                Treatment: Standing tolerance - Pt stood for approx 5  minutes while placing and removing resistance clothespins 1-8# with RUE  Reviewed HEP with BUE exercises using theraband. Upgraded from yellow to red this day. Patient required rest breaks after each exercise. Required min verbal cues and demonstration.  Small Peg board design with alternating attention with looking at pattern to the left and back to board. Patient utilized RUE for coordination and completed pattern with increased time and no additional assistance.   UEB 5 minutes level 1      OT Education - 01/28/20 1423    Education Details upgraded HEP to red theraband. See pt instructions for exercises. printed and gave to patient again    Person(s) Educated Patient    Methods Explanation;Demonstration;Handout;Verbal cues    Comprehension Verbalized understanding;Returned demonstration;Verbal cues required               OT Long Term Goals - 01/26/20 1502      OT LONG TERM GOAL #1   Title I with HEP coordination,and proximal UE strength    Time 4    Period Weeks    Status Achieved   pt reports doing yellow theraband exercises at home 01/14/20     OT  LONG TERM GOAL #2   Title Pt will demonstrate increased RUE fine motor coordination for ADLs as eveidenced by decreasing 9 hole peg test score to 35 secs or less.    Baseline RUe39.97, LUE 28.94    Time 4    Period Weeks    Status Achieved   RUE 29.6 seconds 01/15/20     OT LONG TERM GOAL #3   Title Pt will perfrom a physical and cognitve task simultaneously with 90% or better accuracy in prep for return to driving and grocery shopping.    Time 2    Period Weeks    Status On-going   75% accuracy 01/26/20     OT LONG TERM GOAL #4   Title Pt will be independent with updated HEP for strengthening and endurance. 02/09/20    Time 2    Period Weeks    Status New    Target Date 02/09/20      OT LONG TERM GOAL #5   Title Pt will tolerate 5 minutes walking or 10 minutes of standing for increased activity tolerance in order to  prep for ADLs and IADLs.    Time 2    Period Weeks    Status New                 Plan - 01/28/20 1407    Clinical Impression Statement Pt continuing to work towards remaining goals. Pt with poor endurance with standing and UE exercises this day but making improvements. Skilled OT continued to benefit patient with increasing strength and endurance.    OT Occupational Profile and History Detailed Assessment- Review of Records and additional review of physical, cognitive, psychosocial history related to current functional performance    Occupational performance deficits (Please refer to evaluation for details): ADL's;IADL's;Leisure;Social Participation    Body Structure / Function / Physical Skills ADL;Gait;Strength;GMC;Dexterity;Balance;UE functional use;ROM;IADL;Endurance;Mobility;Flexibility;Coordination;Decreased knowledge of precautions;FMC;Decreased knowledge of use of DME    Rehab Potential Good    OT Frequency 2x / week    OT Duration 2 weeks    OT Treatment/Interventions Self-care/ADL training;Moist Heat;Fluidtherapy;DME and/or AE instruction;Therapeutic activities;Aquatic Therapy;Gait Training;Ultrasound;Therapeutic exercise    Plan alternating attention, standing and walking tolerance    Consulted and Agree with Plan of Care Patient           Patient will benefit from skilled therapeutic intervention in order to improve the following deficits and impairments:   Body Structure / Function / Physical Skills: ADL, Gait, Strength, GMC, Dexterity, Balance, UE functional use, ROM, IADL, Endurance, Mobility, Flexibility, Coordination, Decreased knowledge of precautions, FMC, Decreased knowledge of use of DME       Visit Diagnosis: Muscle weakness (generalized)  Other lack of coordination  Unsteadiness on feet  Other abnormalities of gait and mobility    Problem List Patient Active Problem List   Diagnosis Date Noted  . Acute on chronic renal failure ([place]) 10/14/2019    . Acute ischemic left MCA stroke ([place]) 10/07/2019  . Morbid obesity ([place])   . NSVT (nonsustained ventricular tachycardia) ([place])   . Diabetic peripheral neuropathy ([place])   . Acute ischemic stroke ([place]) 10/04/2019  . Hypoglycemia due to insulin 10/04/2019  . CKD (chronic kidney disease), stage III 10/04/2019  . Dehydration 10/04/2019  . Dyslipidemia 10/04/2019  . Stroke ([place]) 10/04/2019  . Ankle fracture, left 02/29/2016  . Closed left ankle fracture 02/29/2016  . Gout 02/20/2016  . Essential hypertension 02/20/2016    Zachery Conch MOT, OTR/L  01/28/2020, 2:28 PM  [facility] [address] [place], [place], [address] Phone: [phone]   Fax:  [phone]  Name: Jerrit Horen. MRN: [id number] Date of Birth: [birth year]

## 2020-01-28 NOTE — Therapy (Signed)
[facility] [address] [place], [place], [address] Phone: [phone]   Fax:  [phone]  Physical Therapy Treatment  Patient Details  Name: Nathan Proctor. MRN: [id number] Date of Birth: 1940/07/02 Referring Provider (PT): Dr. Letta Pate   Encounter Date: 01/28/2020   PT End of Session - 01/28/20 1553    Visit Number 12    Number of Visits 14    Date for PT Re-Evaluation 29/93/71   60 day cert, 4 wk POC   Authorization Type [employer] Medicare-checking on approval 12/24/2019 visit    PT Start Time 6967    PT Stop Time 1530    PT Time Calculation (min) 45 min    Equipment Utilized During Treatment Gait belt    Activity Tolerance Patient tolerated treatment well;Patient limited by fatigue   requests seated rest breaks throughout   Behavior During Therapy WFL for tasks assessed/performed           Past Medical History:  Diagnosis Date  . Anemia of chronic disease   . Ankle fracture 02/15/2016  . Cancer [facility])    Prostate  . Chronic constipation   . Chronic kidney disease    stage III  . Diabetes mellitus without complication ([place])    type II   . Diabetic peripheral neuropathy ([place])   . Failure to thrive (0-17)   . Fracture of left lower leg   . Gout   . Hyperlipidemia   . Hypertension   . Morbid obesity ([place])   . Unstable gait     Past Surgical History:  Procedure Laterality Date  . ORIF ANKLE FRACTURE Left 02/29/2016   Procedure: OPEN REDUCTION INTERNAL FIXATION (ORIF) ANKLE FRACTURE;  Surgeon: Nicholes Stairs, MD;  [facility];  Service: Orthopedics;  Laterality: Left;  . PROSTATE SURGERY      There were no vitals filed for this visit.   Subjective Assessment - 01/28/20 1501    Subjective When I fell last time, I couldn't get back up and 2 men had to pick me up.    Patient is accompained by: Family member   spouse   Pertinent History diabetes, prostate cancer, CKD 3, diabetic  neuropathy, morbid obesity, hypertension, hyperlipidemia, Hx of ankle fracture    Patient Stated Goals Pt's goals for therapy are to improve steadiness.              Pt verbally educated on safe floor transfers. Discussed having family/friends bring a chair out to him if he falls outside and if there is no support surface nearby. Having him stand up using support surface I is much safer then having someone pick him up. This will reduce risk of patient injury and also reduce injury risk to person who his helping him off the floor. Pt verbalized understanding and didn't feel he needed to practice that as he was able to get off the floor in past with previous falls with use of support surface.  Walking fwd and bwd: 2 pt gait walking fwd, 3 pt gait walking bwd: 3 x 20 feet each way  Walking laterally: 3 x 10 feet pt holds can in air  Sit to stand: 2 x 10 Heel raises: 2 x 10   Discussed and demo  countertop exercises: walking laterally,  Walking backwards, heel raises, high knee flexion (handouts given)  PT Long Term Goals - 01/21/20 0003      PT LONG TERM GOAL #1   Title Pt will be independent with progression of HEP for improved strength, balance, transfers, and gait.  TARGET 02/05/2020 (may be delayed due to delay in scheduling)    Time 5    Period Weeks    Status Revised      PT LONG TERM GOAL #2   Title Pt will improve 5x sit<>stand to less than or equal to 15 seconds to demonstrate improved functional strength and transfer efficiency.    Baseline 12/04/19: 14.50 sec's no UE support from standard height surface; 12/24/19:  21.54    Time 5    Period Weeks    Status Revised      PT LONG TERM GOAL #3   Title Pt will improve DGI score to at least 19/24 to decrease fall risk.    Baseline 12/04/19: 19/24 scored today; had decreased to 15/24 after ED visit/hold from therapy 12/24/2019    Time 5    Period Weeks    Status Revised       PT LONG TERM GOAL #4   Title Pt will improve TUG score to less than or equal to 13.5 seconds for decreased fall risk.    Baseline 12/04/19: 12.41 sec's no AD; after ED visit/hold from therapy 18.63 sec 12/24/2019    Time 5    Period Weeks    Status Revised      PT LONG TERM GOAL #5   Title Pt will improve gait velocity to at least 2.62 ft/sec for improved gait effiiciency for community ambulation.    Baseline 12/04/19: 2.98 ft/sed no AD, decreased to 2.38 ft/sec 12/24/2019    Time 5    Period Weeks    Status On-going      PT LONG TERM GOAL #6   Title Pt/wife will verbalize understanding of plans for transition to community fitness upon d/c from PT, to maximize gains made in therapy.    Time 5    Period Weeks    Status New                 Plan - 01/28/20 1501    Clinical Impression Statement Today's session was focused on developing some exercise program that patient can perform at the countertop at home. patient was educated on gradual walking program where he can walk outside with his cane with a family member for safety or he can go to the store and walk around periphery of the store. We discuased on how to properly get off the floor as last time he fell, he needed help from 2 people to get off the floor. Pt was offered to practice floor transfers today. Patient was educated that having a support surface can help him greatly. Pt was educated that he can always have his helpers get a tall stool or chair from house and bring it to him so he can use that to get off the floor. Patient reported that he feels comfortable doing that as he was able to get off the floor using support surface in past with previous falls. We discussed discharge planning for next week. Pt was educated to think about any questions or conerns he may have and bring them to his last 2 appts so we can go over them with him.    Personal Factors and Comorbidities Comorbidity 3+    Comorbidities PMH: diabetes, prostate  cancer, CKD 3, diabetic neuropathy, morbid obesity,  hypertension, hyperlipidemia    Examination-Activity Limitations Stand;Transfers;Locomotion Level    Examination-Participation Restrictions Community Activity;Driving    Stability/Clinical Decision Making Evolving/Moderate complexity    Rehab Potential Good    PT Frequency 2x / week    PT Duration Other (comment)   for 4 weeks, plus 1x/wk (wk of 12/24/2019); total = 5 weeks   PT Treatment/Interventions ADLs/Self Care Home Management;Gait training;Stair training;Functional mobility training;Therapeutic activities;Therapeutic exercise;Balance training;Neuromuscular re-education;Patient/family education    PT Next Visit Plan Plan D/C next week, review and finalize HEP    Consulted and Agree with Plan of Care Patient;Family member/caregiver    Family Member Consulted wife           Patient will benefit from skilled therapeutic intervention in order to improve the following deficits and impairments:  Abnormal gait, Difficulty walking, Decreased activity tolerance, Decreased endurance, Decreased balance, Decreased mobility, Decreased strength  Visit Diagnosis: Muscle weakness (generalized)  Other lack of coordination  Unsteadiness on feet  Other abnormalities of gait and mobility  Acute ischemic left MCA stroke [facility])     Problem List Patient Active Problem List   Diagnosis Date Noted  . Acute on chronic renal failure ([place]) 10/14/2019  . Acute ischemic left MCA stroke ([place]) 10/07/2019  . Morbid obesity ([place])   . NSVT (nonsustained ventricular tachycardia) ([place])   . Diabetic peripheral neuropathy ([place])   . Acute ischemic stroke ([place]) 10/04/2019  . Hypoglycemia due to insulin 10/04/2019  . CKD (chronic kidney disease), stage III 10/04/2019  . Dehydration 10/04/2019  . Dyslipidemia 10/04/2019  . Stroke ([place]) 10/04/2019  . Ankle fracture, left 02/29/2016  . Closed left ankle fracture 02/29/2016  . Gout 02/20/2016  . Essential  hypertension 02/20/2016    Kerrie Pleasure 01/28/2020, 3:58 PM  [facility] [address] [place], [place], [address] Phone: [phone]   Fax:  [phone]  Name: Nathan Proctor. MRN: [id number] Date of Birth: 1940-09-27

## 2020-01-28 NOTE — Patient Instructions (Signed)
Access Code: [email or web address] URL: [email or web address] Date: 01/28/2020 Prepared by: Markus Jarvis  Exercises Standing Heel Raise with Support - 1 x daily - 7 x weekly - 2 sets - 10 reps Side Stepping with Unilateral Counter Support - 1 x daily - 7 x weekly - 1 sets - 10 reps Backward Walking with Counter Support - 1 x daily - 7 x weekly - 3 sets - 10 reps Standing Marching - 1 x daily - 7 x weekly - 1 sets - 10 reps

## 2020-01-28 NOTE — Patient Instructions (Signed)

## 2020-02-02 ENCOUNTER — Ambulatory Visit: Payer: Medicare PPO | Admitting: Occupational Therapy

## 2020-02-02 ENCOUNTER — Other Ambulatory Visit: Payer: Self-pay

## 2020-02-02 ENCOUNTER — Encounter: Payer: Self-pay | Admitting: Occupational Therapy

## 2020-02-02 ENCOUNTER — Ambulatory Visit: Payer: Medicare PPO | Attending: Physical Medicine and Rehabilitation | Admitting: Physical Therapy

## 2020-02-02 DIAGNOSIS — R42 Dizziness and giddiness: Secondary | ICD-10-CM | POA: Diagnosis present

## 2020-02-02 DIAGNOSIS — M6281 Muscle weakness (generalized): Secondary | ICD-10-CM

## 2020-02-02 DIAGNOSIS — R278 Other lack of coordination: Secondary | ICD-10-CM | POA: Insufficient documentation

## 2020-02-02 DIAGNOSIS — R2681 Unsteadiness on feet: Secondary | ICD-10-CM

## 2020-02-02 DIAGNOSIS — R2689 Other abnormalities of gait and mobility: Secondary | ICD-10-CM | POA: Diagnosis present

## 2020-02-02 NOTE — Therapy (Signed)
[facility] [address] [place], [place], [address] Phone: [phone]   Fax:  [phone]  Occupational Therapy Treatment  Patient Details  Name: Nathan Proctor. MRN: [id number] Date of Birth: 1940-11-23 Referring Provider (OT): Dr. Letta Pate   Encounter Date: 02/02/2020   OT End of Session - 02/02/20 1239    Visit Number 10    Number of Visits 12    Date for OT Re-Evaluation 02/11/20    Authorization Type [employer] [facility]    Authorization Time Period 90 days    OT Start Time 1237    OT Stop Time 1316    OT Time Calculation (min) 39 min    Activity Tolerance Patient tolerated treatment well    Behavior During Therapy [facility] for tasks assessed/performed           Past Medical History:  Diagnosis Date   Anemia of chronic disease    Ankle fracture 02/15/2016   Cancer ([place])    Prostate   Chronic constipation    Chronic kidney disease    stage III   Diabetes mellitus without complication ([place])    type II    Diabetic peripheral neuropathy ([place])    Failure to thrive (0-17)    Fracture of left lower leg    Gout    Hyperlipidemia    Hypertension    Morbid obesity ([place])    Unstable gait     Past Surgical History:  Procedure Laterality Date   ORIF ANKLE FRACTURE Left 02/29/2016   Procedure: OPEN REDUCTION INTERNAL FIXATION (ORIF) ANKLE FRACTURE;  Surgeon: Nicholes Stairs, MD;  [facility];  Service: Orthopedics;  Laterality: Left;   PROSTATE SURGERY      There were no vitals filed for this visit.   Subjective Assessment - 02/02/20 1238    Subjective  Pt reports some arthritis in shoulder, no pain currently.  Pt reports that he walks to do what he needs to do but is not walking/standing just to walk because he still gets tired.--recommended pt discuss with PT    Patient is accompanied by: Family member    Limitations Fall Risk.    Patient Stated Goals to be close to independent     Currently in Pain? No/denies           Standing for approx 12 min and performing BUE functional reaching to place small pegs in vertical pegboard to copy design.  Pt needed mod cueing for copying design.  After rest, pt able to stand for another 3 min.    Discussed progress and barriers to activity tolerance with standing/walking at home.  Pt reports feeling "lightheaded/dizzy" when standing.  Discussed need to slowly incr activity to improve activity tolerance.    Arm bike x20min level 3 for conditioning without rest.  O2 98% with HR 73bpm.  Ambulating while tossing ball between hands and performing category generation for divided attention with approx 75% accuracy/min cueing needed.    Pt also needed rest breaks between each activity.     OT Long Term Goals - 02/02/20 1300      OT LONG TERM GOAL #1   Title I with HEP coordination,and proximal UE strength    Time 4    Period Weeks    Status Achieved   pt reports doing yellow theraband exercises at home 01/14/20     OT LONG TERM GOAL #2   Title Pt will demonstrate increased RUE fine motor coordination for ADLs as  eveidenced by decreasing 9 hole peg test score to 35 secs or less.    Baseline RUe39.97, LUE 28.94    Time 4    Period Weeks    Status Achieved   RUE 29.6 seconds 01/15/20     OT LONG TERM GOAL #3   Title Pt will perfrom a physical and cognitve task simultaneously with 90% or better accuracy in prep for return to driving and grocery shopping.    Time 2    Period Weeks    Status On-going   75% accuracy 01/26/20     OT LONG TERM GOAL #4   Title Pt will be independent with updated HEP for strengthening and endurance. 02/09/20    Time 2    Period Weeks    Status On-going      OT LONG TERM GOAL #5   Title Pt will tolerate 5 minutes walking or 10 minutes of standing for increased activity tolerance in order to prep for ADLs and IADLs.    Time 2    Period Weeks    Status On-going   02/02/20:  12 min standing today                  Plan - 02/02/20 1240    Clinical Impression Statement Progress Note Reporting Period 11/06/19-02/02/20:  Pt is progressing towards goals with slowly improving activity tolerance.  Initial POC was interrrupted due to medical issue and schedule conflicts.  Therefore, pt would benefit from additional occupational therapy to address remaining unmet goals for improved IADL performance.    OT Occupational Profile and History Detailed Assessment- Review of Records and additional review of physical, cognitive, psychosocial history related to current functional performance    Occupational performance deficits (Please refer to evaluation for details): ADL's;IADL's;Leisure;Social Participation    Body Structure / Function / Physical Skills ADL;Gait;Strength;GMC;Dexterity;Balance;UE functional use;ROM;IADL;Endurance;Mobility;Flexibility;Coordination;Decreased knowledge of precautions;FMC;Decreased knowledge of use of DME    Rehab Potential Good    OT Frequency 2x / week    OT Duration 2 weeks    OT Treatment/Interventions Self-care/ADL training;Moist Heat;Fluidtherapy;DME and/or AE instruction;Therapeutic activities;Aquatic Therapy;Gait Training;Ultrasound;Therapeutic exercise    Plan alternating attention, standing and walking tolerance, ?anticipate d/c in next 1-2 visits    Consulted and Agree with Plan of Care Patient           Patient will benefit from skilled therapeutic intervention in order to improve the following deficits and impairments:   Body Structure / Function / Physical Skills: ADL, Gait, Strength, GMC, Dexterity, Balance, UE functional use, ROM, IADL, Endurance, Mobility, Flexibility, Coordination, Decreased knowledge of precautions, FMC, Decreased knowledge of use of DME       Visit Diagnosis: Muscle weakness (generalized)  Other lack of coordination  Unsteadiness on feet    Problem List Patient Active Problem List   Diagnosis Date Noted   Acute on chronic  renal failure ([place]) 10/14/2019   Acute ischemic left MCA stroke ([place]) 10/07/2019   Morbid obesity ([place])    NSVT (nonsustained ventricular tachycardia) ([place])    Diabetic peripheral neuropathy ([place])    Acute ischemic stroke ([place]) 10/04/2019   Hypoglycemia due to insulin 10/04/2019   CKD (chronic kidney disease), stage III ([place]) 10/04/2019   Dehydration 10/04/2019   Dyslipidemia 10/04/2019   Stroke ([place]) 10/04/2019   Ankle fracture, left 02/29/2016   Closed left ankle fracture 02/29/2016   Gout 02/20/2016   Essential hypertension 02/20/2016    [facility] 02/02/2020, 2:42 PM  [facility] [address]  [place], [place], [address] Phone: [phone]   Fax:  [phone]  Name: Nathan Proctor. MRN: [id number] Date of Birth: August 23, 1940   Vianne Bulls, OTR/L [facility] [address] [place], [place]  [address] [phone] phone [phone] 02/02/20 2:42 PM

## 2020-02-03 NOTE — Therapy (Signed)
[facility] [address] [place], [place], [address] Phone: [phone]   Fax:  [phone]  Physical Therapy Treatment  Patient Details  Name: Nathan Proctor. MRN: [id number] Date of Birth: Oct 22, 1940 Referring Provider (PT): Dr. Letta Pate   Encounter Date: 02/02/2020   PT End of Session - 02/03/20 1721    Visit Number 13    Number of Visits 14    Date for PT Re-Evaluation 68/11/57   60 day cert, 4 wk POC   Authorization Type [employer] Medicare-checking on approval 12/24/2019 visit    PT Start Time 2620    PT Stop Time 1400    PT Time Calculation (min) 43 min    Equipment Utilized During Treatment Gait belt    Activity Tolerance Patient tolerated treatment well;Patient limited by fatigue   requests seated rest breaks throughout; O2 sats WNL; cues for pursed lip breathing   Behavior During Therapy [facility] for tasks assessed/performed           Past Medical History:  Diagnosis Date  . Anemia of chronic disease   . Ankle fracture 02/15/2016  . Cancer [facility])    Prostate  . Chronic constipation   . Chronic kidney disease    stage III  . Diabetes mellitus without complication ([place])    type II   . Diabetic peripheral neuropathy ([place])   . Failure to thrive (0-17)   . Fracture of left lower leg   . Gout   . Hyperlipidemia   . Hypertension   . Morbid obesity ([place])   . Unstable gait     Past Surgical History:  Procedure Laterality Date  . ORIF ANKLE FRACTURE Left 02/29/2016   Procedure: OPEN REDUCTION INTERNAL FIXATION (ORIF) ANKLE FRACTURE;  Surgeon: Nicholes Stairs, MD;  Location: [facility];  Service: Orthopedics;  Laterality: Left;  . PROSTATE SURGERY      There were no vitals filed for this visit.   Subjective Assessment - 02/02/20 1314    Subjective Sometimes feel lightheaded when I walk.  I feel like it's my brain is in a healing mode.  When I turn my head fast, and feel rushed, I feel like it shakes  something up.    Patient is accompained by: Family member   spouse   Pertinent History diabetes, prostate cancer, CKD 3, diabetic neuropathy, morbid obesity, hypertension, hyperlipidemia, Hx of ankle fracture    Patient Stated Goals Pt's goals for therapy are to improve steadiness.    Currently in Pain? Yes    Pain Score 5     Pain Location Shoulder    Pain Orientation Right;Left    Pain Descriptors / Indicators Aching   arthritis-type pain   Pain Type Chronic pain    Pain Onset More than a month ago    Aggravating Factors  lying on it certain ways    Pain Relieving Factors stretching it out, cream helps                   Vestibular Assessment - 02/02/20 1325      Symptom Behavior   Subjective history of current problem happens when walking, gradually comes on after fatigue, happens at times with quick head turns    Type of Dizziness  Comment   Woozy, "swimming headed", unbalanced   Frequency of Dizziness with increased activity    Duration of Dizziness seconds    Symptom Nature Motion provoked;Intermittent    Aggravating Factors Activity in general;Turning head  quickly;Turning body quickly   [place]Alking, especially when fatigued   Relieving Factors Rest    Progression of Symptoms No change since onset    History of similar episodes Has started just since this CVA      Oculomotor Exam   Oculomotor Alignment Normal    Ocular ROM WNL    Head shaking Horizontal Absent   No symptoms     Vestibulo-Ocular Reflex   VOR 1 Head Only (x 1 viewing) Pt performs x 10 reps, no symptoms      Visual Acuity   Static Line 8    Dynamic Line 4   No c/o symptoms                   [facility] Adult PT Treatment/Exercise - 02/02/20 1320      Transfers   Transfers Sit to Stand;Stand to Sit    Sit to Stand 5: Supervision;Without upper extremity assist;From chair/3-in-1    Five time sit to stand comments  14.28    Stand to Sit 5: Supervision;Without upper extremity assist;To  chair/3-in-1      Standardized Balance Assessment   Standardized Balance Assessment Timed Up and Go Test      Timed Up and Go Test   TUG Normal TUG    Normal TUG (seconds) 13.75   13.65, 11.85              Balance Exercises - 02/02/20 1325      Balance Exercises: Standing   Standing Eyes Opened Wide (BOA);Solid surface;5 reps   Head turns, head nods 2 sets x 5   Standing Eyes Closed Wide (BOA);Solid surface;3 reps;10 secs         Pt does have slight lag with eye movements and head turns in horizontal and vertical directions.    PT Education - 02/03/20 1728    Education Details Looking at vision system for balance to determine where his "wooziness" feeling may be coming from    [employer]) Educated Patient;Spouse    Methods Explanation    Comprehension Verbalized understanding               PT Long Term Goals - 02/02/20 1332      PT LONG TERM GOAL #1   Title Pt will be independent with progression of HEP for improved strength, balance, transfers, and gait.  TARGET 02/05/2020 (may be delayed due to delay in scheduling)    Time 5    Period Weeks    Status Revised      PT LONG TERM GOAL #2   Title Pt will improve 5x sit<>stand to less than or equal to 15 seconds to demonstrate improved functional strength and transfer efficiency.    Baseline 12/04/19: 14.50 sec's no UE support from standard height surface; 12/24/19:  21.54; 02/02/20:  14.28    Time 5    Period Weeks    Status Achieved      PT LONG TERM GOAL #3   Title Pt will improve DGI score to at least 19/24 to decrease fall risk.    Baseline 12/04/19: 19/24 scored today; had decreased to 15/24 after ED visit/hold from therapy 12/24/2019    Time 5    Period Weeks    Status Revised      PT LONG TERM GOAL #4   Title Pt will improve TUG score to less than or equal to 13.5 seconds for decreased fall risk.    Baseline 12/04/19: 12.41 sec's no AD; after ED visit/hold  from therapy 18.63 sec 12/24/2019; 11.85 sec 02/02/2020      Time 5    Period Weeks    Status Achieved      PT LONG TERM GOAL #5   Title Pt will improve gait velocity to at least 2.62 ft/sec for improved gait effiiciency for community ambulation.    Baseline 12/04/19: 2.98 ft/sed no AD, decreased to 2.38 ft/sec 12/24/2019    Time 5    Period Weeks    Status On-going      PT LONG TERM GOAL #6   Title Pt/wife will verbalize understanding of plans for transition to community fitness upon d/c from PT, to maximize gains made in therapy.    Time 5    Period Weeks    Status New                 Plan - 02/03/20 1722    Clinical Impression Statement Reviewed HEP with pt return demo understanding, from additional last visit.  Began assessing LTGs, with pt meeting LTG 2 and LTG 4.  Pt describes (and OT also attests to this per report today) pt's c/o wooziness/swimmy-headedness.  He rpeorts it happens with fatigue and with head motions/quick turns.  Assessed visual system use for balance today, with pt noting 4 line difference with static/dynamic visual acuity and slightly slowed visual tracking with head/eye movements (horizontal).  Pt does not describe room-spinning dizziness, so positional vertigo testing not performed.  Discussed POC with him and decided in lieu of these descriptions, we may need to focus on some visual/vestibular aspects of balance in order to further help pt's mobility and decreased fall risk.    Personal Factors and Comorbidities Comorbidity 3+    Comorbidities PMH: diabetes, prostate cancer, CKD 3, diabetic neuropathy, morbid obesity, hypertension, hyperlipidemia    Examination-Activity Limitations Stand;Transfers;Locomotion Level    Examination-Participation Restrictions Community Activity;Driving    Stability/Clinical Decision Making Evolving/Moderate complexity    Rehab Potential Good    PT Frequency 2x / week    PT Duration Other (comment)   for 4 weeks, plus 1x/wk (wk of 12/24/2019); total = 5 weeks   PT  Treatment/Interventions ADLs/Self Care Home Management;Gait training;Stair training;Functional mobility training;Therapeutic activities;Therapeutic exercise;Balance training;Neuromuscular re-education;Patient/family education    PT Next Visit Plan Per discussion with pt today, will plan to renew; need to assess remaining LTGs; look at corner balance, standing EO/EC foam and work on more compliant surfaces, give x1 or x2 viewing if needed to add to HEP (should we check orthostatics to rule out?)   Consulted and Agree with Plan of Care Patient;Family member/caregiver    Family Member Consulted wife           Patient will benefit from skilled therapeutic intervention in order to improve the following deficits and impairments:  Abnormal gait, Difficulty walking, Decreased activity tolerance, Decreased endurance, Decreased balance, Decreased mobility, Decreased strength  Visit Diagnosis: Unsteadiness on feet  Muscle weakness (generalized)  Other abnormalities of gait and mobility     Problem List Patient Active Problem List   Diagnosis Date Noted  . Acute on chronic renal failure ([place]) 10/14/2019  . Acute ischemic left MCA stroke ([place]) 10/07/2019  . Morbid obesity ([place])   . NSVT (nonsustained ventricular tachycardia) ([place])   . Diabetic peripheral neuropathy ([place])   . Acute ischemic stroke ([place]) 10/04/2019  . Hypoglycemia due to insulin 10/04/2019  . CKD (chronic kidney disease), stage III ([place]) 10/04/2019  . Dehydration 10/04/2019  . Dyslipidemia 10/04/2019  .  Stroke ([place]) 10/04/2019  . Ankle fracture, left 02/29/2016  . Closed left ankle fracture 02/29/2016  . Gout 02/20/2016  . Essential hypertension 02/20/2016    Jonluke Cobbins W. 02/03/2020, 5:31 PM  Frazier Butt., PT  [facility] [address] [place], [place], [address] Phone: [phone]   Fax:  [phone]  Name: Nathan Proctor. MRN:  [id number] Date of Birth: 1941/02/11

## 2020-02-04 ENCOUNTER — Ambulatory Visit: Payer: Medicare PPO | Admitting: Occupational Therapy

## 2020-02-04 ENCOUNTER — Other Ambulatory Visit: Payer: Self-pay

## 2020-02-04 ENCOUNTER — Ambulatory Visit: Payer: Medicare PPO | Admitting: Physical Therapy

## 2020-02-04 VITALS — HR 83

## 2020-02-04 DIAGNOSIS — R2689 Other abnormalities of gait and mobility: Secondary | ICD-10-CM

## 2020-02-04 DIAGNOSIS — R278 Other lack of coordination: Secondary | ICD-10-CM

## 2020-02-04 DIAGNOSIS — R2681 Unsteadiness on feet: Secondary | ICD-10-CM

## 2020-02-04 DIAGNOSIS — M6281 Muscle weakness (generalized): Secondary | ICD-10-CM

## 2020-02-04 DIAGNOSIS — R42 Dizziness and giddiness: Secondary | ICD-10-CM

## 2020-02-04 NOTE — Therapy (Signed)
[place] [address] [place], [place], [address] Phone: [phone]   Fax:  [phone]  Physical Therapy Treatment  Patient Details  Name: Nathan Proctor. MRN: [id number] Date of Birth: May 14, 1940 Referring Provider (PT): Dr. Letta Pate   Encounter Date: 02/04/2020   PT End of Session - 02/04/20 1626    Visit Number 14    Number of Visits 22    Date for PT Re-Evaluation 34/19/62   60 day cert for 4 week POC   Authorization Type [employer] Medicare-submitted additional paper after 02/04/2020 visit requesting 8 more visits    Progress Note Due on Visit 20    PT Start Time 1239   Pt arrives late   PT Stop Time 1317    PT Time Calculation (min) 38 min    Equipment Utilized During Treatment Gait belt    Activity Tolerance Patient tolerated treatment well    Behavior During Therapy WFL for tasks assessed/performed           Past Medical History:  Diagnosis Date  . Anemia of chronic disease   . Ankle fracture 02/15/2016  . Cancer [facility])    Prostate  . Chronic constipation   . Chronic kidney disease    stage III  . Diabetes mellitus without complication ([place])    type II   . Diabetic peripheral neuropathy ([place])   . Failure to thrive (0-17)   . Fracture of left lower leg   . Gout   . Hyperlipidemia   . Hypertension   . Morbid obesity ([place])   . Unstable gait     Past Surgical History:  Procedure Laterality Date  . ORIF ANKLE FRACTURE Left 02/29/2016   Procedure: OPEN REDUCTION INTERNAL FIXATION (ORIF) ANKLE FRACTURE;  Surgeon: Nicholes Stairs, MD;  [facility];  Service: Orthopedics;  Laterality: Left;  . PROSTATE SURGERY      There were no vitals filed for this visit.   Subjective Assessment - 02/04/20 1241    Subjective Nothing has really changed.  Sometimes when I lie down and turn over in the bed, I get a little rush in my head.  It's over in a couple of seconds.  No falls.    Patient is  accompained by: Family member   spouse   Pertinent History diabetes, prostate cancer, CKD 3, diabetic neuropathy, morbid obesity, hypertension, hyperlipidemia, Hx of ankle fracture    Patient Stated Goals Pt's goals for therapy are to improve steadiness.    Currently in Pain? Yes    Pain Score 6     Pain Location Shoulder    Pain Orientation Right;Left    Pain Descriptors / Indicators Aching    Pain Type Chronic pain    Pain Onset More than a month ago    Pain Frequency Intermittent    Aggravating Factors  lying on it certain ways    Pain Relieving Factors stretching it out                   Vestibular Assessment - 02/04/20 0001      Positional Testing   Sidelying Test Sidelying Right;Sidelying Left      Sidelying Right   Sidelying Right Duration Performed 2 reps; pt closes eyes first rep, c/o minimal dizziness, but not the sensation of heaviness in top of his head (his c/c today)    Sidelying Right Symptoms No nystagmus;Other (comment)      Sidelying Left   Sidelying Left  Duration Performed x 1 rep, no symtpoms    Sidelying Left Symptoms No nystagmus      Positional Sensitivities   Rolling Right No dizziness    Rolling Left No dizziness      Orthostatics   BP supine (x 5 minutes) 121/72    HR supine (x 5 minutes) 72    BP standing (after 1 minute) 114/66    HR standing (after 1 minute) 92    BP standing (after 3 minutes) 115/71    HR standing (after 3 minutes) 86    Orthostatics Comment Pt c/o initial lightheadedness upon initial standing.          Educated patient in generally spending a few minutes in each position with position changes to avoid quick changes of movement.          [employer] Adult PT Treatment/Exercise - 02/04/20 0001      Ambulation/Gait   Ambulation/Gait Yes    Ambulation/Gait Assistance 5: Supervision    Ambulation Distance (Feet) 115 Feet    Assistive device Straight cane    Gait Pattern Step-through pattern;Wide base of  support;Trendelenburg    Ambulation Surface Level;Indoor    Gait velocity 12.15 sec = 2.7 ft/sec                  PT Education - 02/04/20 1624    Education Details Orthostatic measures and positional testing for BPPV-does not appear to be either of those contributing to dizziness/wooziness    Person(s) Educated Patient;Spouse    Methods Explanation    Comprehension Verbalized understanding               PT Long Term Goals - 02/04/20 1628      PT LONG TERM GOAL #1   Title Pt will be independent with progression of HEP for improved strength, balance, transfers, and gait.  TARGET 02/05/2020 (may be delayed due to delay in scheduling)    Time 5    Period Weeks    Status On-going      PT LONG TERM GOAL #2   Title Pt will improve 5x sit<>stand to less than or equal to 15 seconds to demonstrate improved functional strength and transfer efficiency.    Baseline 12/04/19: 14.50 sec's no UE support from standard height surface; 12/24/19:  21.54; 02/02/20:  14.28    Time 5    Period Weeks    Status Achieved      PT LONG TERM GOAL #3   Title Pt will improve DGI score to at least 19/24 to decrease fall risk.    Baseline 12/04/19: 19/24 scored today; had decreased to 15/24 after ED visit/hold from therapy 12/24/2019 (unable to assess 02/04/2020 due to time constraints)    Time 5    Period Weeks    Status On-going      PT LONG TERM GOAL #4   Title Pt will improve TUG score to less than or equal to 13.5 seconds for decreased fall risk.    Baseline 12/04/19: 12.41 sec's no AD; after ED visit/hold from therapy 18.63 sec 12/24/2019; 11.85 sec 02/02/2020    Time 5    Period Weeks    Status Achieved      PT LONG TERM GOAL #5   Title Pt will improve gait velocity to at least 2.62 ft/sec for improved gait effiiciency for community ambulation.    Baseline 12/04/19: 2.98 ft/sed no AD, decreased to 2.38 ft/sec 12/24/2019; 2.7 f/tsec 02/04/2020    Time 5  Period Weeks    Status Achieved      PT  LONG TERM GOAL #6   Title Pt/wife will verbalize understanding of plans for transition to community fitness upon d/c from PT, to maximize gains made in therapy.    Baseline Initiated conversation, but pt has not yet followed through    Time 5    Period Weeks    Status On-going           New LTGs:  PT Long Term Goals - 02/04/20 1646      PT LONG TERM GOAL #1   Title Pt will be independent with progression of HEP for improved strength, balance, transfers, and gait.  TARGET 03/04/2020    Time 4    Period Weeks    Status On-going      PT LONG TERM GOAL #2   Title Pt will report 50% decrease in dizziness/wooziness with gait activities in the home for improved overall functional mobility.    Time 4    Period Weeks    Status New      PT LONG TERM GOAL #3   Title Pt will improve DGI score to at least 19/24 to decrease fall risk.    Baseline 12/04/19: 19/24 scored today; had decreased to 15/24 after ED visit/hold from therapy 12/24/2019 (unable to assess 02/04/2020 due to time constraints)    Time 4    Period Weeks    Status On-going      PT LONG TERM GOAL #4   Title Pt will ambulate at least 1000 ft, indoors and outdoors with cane, mod independently, for return to community gait.    Time 4    Period Weeks    Status New      PT LONG TERM GOAL #5   Title Pt/wife will verbalize understanding of plans for transition to community fitness upon d/c from PT, to maximize gains made in therapy.    Time 4    Period Weeks    Status On-going                Plan - 02/04/20 1630    Clinical Impression Statement Began session today assessing orthostatic blood pressure measures, with pt's BP not changing in large enough parameters to be considered orthostatic hypotension; however, pt's HR does increase from 72 in supine to 92 in standing.  Also, based on pt's reports of heavy feeling in head with rolling, decided to perform positional testing-no nystagmus noted sidelying test or with rolling  R and L.  Pt does not complain of that feeling with these movements, though he does report slight dizziness/wooziness.  Assessed gait velocity, with pt meeting LTG 5.  Due to time constraints, did not assess DGI or HEP.  LTG 1, 3, and 6 are ongoing.  Plan to recert/renew for short course of therapy to address pt's feelings of wooziness and imbalance for overall improved functional mobility.    Personal Factors and Comorbidities Comorbidity 3+    Comorbidities PMH: diabetes, prostate cancer, CKD 3, diabetic neuropathy, morbid obesity, hypertension, hyperlipidemia    Examination-Activity Limitations Stand;Transfers;Locomotion Level    Examination-Participation Restrictions Community Activity;Driving    Stability/Clinical Decision Making Evolving/Moderate complexity    Rehab Potential Good    PT Frequency 2x / week    PT Duration 4 weeks   per recert 43/06/2949   PT Treatment/Interventions ADLs/Self Care Home Management;Gait training;Stair training;Functional mobility training;Therapeutic activities;Therapeutic exercise;Balance training;Neuromuscular re-education;Patient/family education    PT Next Visit Plan Corner balance,  standing EO/EC foam, work on compliant surfaces; give x1 or x2 viewing if neede to add to HEP; make sure pt is walking at home as part of HEP    Consulted and Agree with Plan of Care Patient;Family member/caregiver    Family Member Consulted wife           Patient will benefit from skilled therapeutic intervention in order to improve the following deficits and impairments:  Abnormal gait, Difficulty walking, Decreased activity tolerance, Decreased endurance, Decreased balance, Decreased mobility, Decreased strength  Visit Diagnosis: Unsteadiness on feet  Dizziness and giddiness  Other abnormalities of gait and mobility  Muscle weakness (generalized)     Problem List Patient Active Problem List   Diagnosis Date Noted  . Acute on chronic renal failure ([place])  10/14/2019  . Acute ischemic left MCA stroke ([place]) 10/07/2019  . Morbid obesity ([place])   . NSVT (nonsustained ventricular tachycardia) ([place])   . Diabetic peripheral neuropathy ([place])   . Acute ischemic stroke ([place]) 10/04/2019  . Hypoglycemia due to insulin 10/04/2019  . CKD (chronic kidney disease), stage III ([place]) 10/04/2019  . Dehydration 10/04/2019  . Dyslipidemia 10/04/2019  . Stroke ([place]) 10/04/2019  . Ankle fracture, left 02/29/2016  . Closed left ankle fracture 02/29/2016  . Gout 02/20/2016  . Essential hypertension 02/20/2016    Dollie Bressi W. 02/04/2020, 4:42 PM Frazier Butt., PT  [facility] [address] [place], [place], [address] Phone: [phone]   Fax:  [phone]  Name: Maurilio Puryear. MRN: [id number] Date of Birth: 12/13/1940

## 2020-02-04 NOTE — Therapy (Signed)
[facility] [address] [place], [place], [address] Phone: [phone]   Fax:  [phone]  Occupational Therapy Treatment  Patient Details  Name: Nathan Proctor. MRN: [id number] Date of Birth: 1940/05/19 Referring Provider (OT): Dr. Letta Pate   Encounter Date: 02/04/2020   OT End of Session - 02/04/20 1507    Visit Number 11    Number of Visits 12    Date for OT Re-Evaluation 02/11/20    Authorization Type [employer]    Authorization Time Period 90 days    OT Start Time 1318    OT Stop Time 1356    OT Time Calculation (min) 38 min    Activity Tolerance Patient tolerated treatment well    Behavior During Therapy WFL for tasks assessed/performed           Past Medical History:  Diagnosis Date  . Anemia of chronic disease   . Ankle fracture 02/15/2016  . Cancer [facility])    Prostate  . Chronic constipation   . Chronic kidney disease    stage III  . Diabetes mellitus without complication ([place])    type II   . Diabetic peripheral neuropathy ([place])   . Failure to thrive (0-17)   . Fracture of left lower leg   . Gout   . Hyperlipidemia   . Hypertension   . Morbid obesity ([place])   . Unstable gait     Past Surgical History:  Procedure Laterality Date  . ORIF ANKLE FRACTURE Left 02/29/2016   Procedure: OPEN REDUCTION INTERNAL FIXATION (ORIF) ANKLE FRACTURE;  Surgeon: Nicholes Stairs, MD;  Location: [facility];  Service: Orthopedics;  Laterality: Left;  . PROSTATE SURGERY      Vitals:   02/04/20 1327  Pulse: 83  SpO2: 97%     Subjective Assessment - 02/04/20 1511    Subjective  denies pain at rest    Limitations Fall Risk.    Patient Stated Goals to be close to independent    Currently in Pain? No/denies                 Treatment:Checked progress towards goals. Ambulating while performing category generation and then tossing ball in one hand min v.c (90% accuracy) for approx 5  mins. Standing to perform functional overhead reach with RUE to place/ remove graded clothespins.  Arm bike x 5 mins level 3 for conditioning.               OT Education - 02/04/20 1511    Education Details reviewed red theraband HEP, 10-20 reps each, min v.c then pt retruned demonstration.    Person(s) Educated Patient    Methods Explanation;Demonstration;Verbal cues    Comprehension Verbalized understanding;Returned demonstration;Verbal cues required               OT Long Term Goals - 02/04/20 1344      OT LONG TERM GOAL #1   Title I with HEP coordination,and proximal UE strength    Status Achieved      OT LONG TERM GOAL #2   Title Pt will demonstrate increased RUE fine motor coordination for ADLs as eveidenced by decreasing 9 hole peg test score to 35 secs or less.    Status Achieved      OT LONG TERM GOAL #3   Title Pt will perfrom a physical and cognitve task simultaneously with 90% or better accuracy in prep for return to driving and grocery shopping.  Status Achieved   met on this day, grossly 90%     OT LONG TERM GOAL #4   Title Pt will be independent with updated HEP for strengthening and endurance. 02/09/20    Status Achieved      OT LONG TERM GOAL #5   Title Pt will tolerate 5 minutes walking or 10 minutes of standing for increased activity tolerance in order to prep for ADLs and IADLs.    Status Achieved   5 mins walking and greater than 10 mins standing                Plan - 02/04/20 1508    Clinical Impression Statement Pt demonstrates good overall progress with OT goals. Pt/ wife agree with d/c from occupational therapy.    OT Occupational Profile and History Detailed Assessment- Review of Records and additional review of physical, cognitive, psychosocial history related to current functional performance    Occupational performance deficits (Please refer to evaluation for details): ADL's;IADL's;Leisure;Social Participation    Body  Structure / Function / Physical Skills ADL;Gait;Strength;GMC;Dexterity;Balance;UE functional use;ROM;IADL;Endurance;Mobility;Flexibility;Coordination;Decreased knowledge of precautions;FMC;Decreased knowledge of use of DME    Rehab Potential Good    OT Frequency 2x / week    OT Duration 2 weeks    OT Treatment/Interventions Self-care/ADL training;Moist Heat;Fluidtherapy;DME and/or AE instruction;Therapeutic activities;Aquatic Therapy;Gait Training;Ultrasound;Therapeutic exercise    Plan d/c OT    Consulted and Agree with Plan of Care Patient           Patient will benefit from skilled therapeutic intervention in order to improve the following deficits and impairments:   Body Structure / Function / Physical Skills: ADL, Gait, Strength, GMC, Dexterity, Balance, UE functional use, ROM, IADL, Endurance, Mobility, Flexibility, Coordination, Decreased knowledge of precautions, FMC, Decreased knowledge of use of DME     OCCUPATIONAL THERAPY DISCHARGE SUMMARY   Current functional level related to goals / functional outcomes: Pt met goals. He demonstrates good overall progress.   Remaining deficits: Decreased strength, decreased endurance, decreased  balance   Education / Equipment: Pt was educated regarding HEP, he demonstrates understanding. Plan: Patient agrees to discharge.  Patient goals were met. Patient is being discharged due to meeting the stated rehab goals.  ?????       Visit Diagnosis: Muscle weakness (generalized)  Other lack of coordination  Unsteadiness on feet  Other abnormalities of gait and mobility    Problem List Patient Active Problem List   Diagnosis Date Noted  . Acute on chronic renal failure ([place]) 10/14/2019  . Acute ischemic left MCA stroke ([place]) 10/07/2019  . Morbid obesity ([place])   . NSVT (nonsustained ventricular tachycardia) ([place])   . Diabetic peripheral neuropathy ([place])   . Acute ischemic stroke ([place]) 10/04/2019  . Hypoglycemia due to insulin  10/04/2019  . CKD (chronic kidney disease), stage III ([place]) 10/04/2019  . Dehydration 10/04/2019  . Dyslipidemia 10/04/2019  . Stroke ([place]) 10/04/2019  . Ankle fracture, left 02/29/2016  . Closed left ankle fracture 02/29/2016  . Gout 02/20/2016  . Essential hypertension 02/20/2016    Nathan Proctor 02/04/2020, 3:12 PM  [facility] [address] [place], [place], [address] Phone: [phone]   Fax:  [phone]  Name: Dyer Klug. MRN: [id number] Date of Birth: May 26, 1940

## 2020-02-16 ENCOUNTER — Ambulatory Visit: Payer: Medicare PPO | Admitting: Physical Therapy

## 2020-02-16 ENCOUNTER — Other Ambulatory Visit: Payer: Self-pay

## 2020-02-16 ENCOUNTER — Encounter: Payer: Self-pay | Admitting: Physical Therapy

## 2020-02-16 VITALS — BP 120/68

## 2020-02-16 DIAGNOSIS — R2681 Unsteadiness on feet: Secondary | ICD-10-CM

## 2020-02-16 DIAGNOSIS — M6281 Muscle weakness (generalized): Secondary | ICD-10-CM

## 2020-02-16 DIAGNOSIS — R2689 Other abnormalities of gait and mobility: Secondary | ICD-10-CM

## 2020-02-16 DIAGNOSIS — R42 Dizziness and giddiness: Secondary | ICD-10-CM

## 2020-02-17 NOTE — Therapy (Signed)
[facility] [address] [place], [place], [address] Phone: [phone]   Fax:  [phone]  Physical Therapy Treatment  Patient Details  Name: Nathan Proctor. MRN: [id number] Date of Birth: April 28, 1941 Referring Provider (PT): Dr. Letta Pate   Encounter Date: 02/16/2020   PT End of Session - 02/16/20 1238    Visit Number 15    Number of Visits 22    Date for PT Re-Evaluation 13/24/40   60 day cert for 4 week POC   Authorization Type [employer]-submitted additional paper after 02/04/2020 visit requesting 8 more visits    Progress Note Due on Visit 20    PT Start Time 1233    PT Stop Time 1313    PT Time Calculation (min) 40 min    Equipment Utilized During Treatment Gait belt    Activity Tolerance Patient tolerated treatment well    Behavior During Therapy WFL for tasks assessed/performed           Past Medical History:  Diagnosis Date  . Anemia of chronic disease   . Ankle fracture 02/15/2016  . Cancer [facility])    Prostate  . Chronic constipation   . Chronic kidney disease    stage III  . Diabetes mellitus without complication ([place])    type II   . Diabetic peripheral neuropathy ([place])   . Failure to thrive (0-17)   . Fracture of left lower leg   . Gout   . Hyperlipidemia   . Hypertension   . Morbid obesity ([place])   . Unstable gait     Past Surgical History:  Procedure Laterality Date  . ORIF ANKLE FRACTURE Left 02/29/2016   Procedure: OPEN REDUCTION INTERNAL FIXATION (ORIF) ANKLE FRACTURE;  Surgeon: Nicholes Stairs, MD;  [facility];  Service: Orthopedics;  Laterality: Left;  . PROSTATE SURGERY      Vitals:   02/16/20 1236  BP: 120/68     Subjective Assessment - 02/16/20 1236    Subjective No new complaints. Still getting the dizziness with quick movements. No falls. Reports he has been trying to slow down and take a moment with transition movements to decrease the occurance of  dizziness. Has not been doing his HEp. "I'm just lazy I guess".    Patient is accompained by: Family member   spouse   Pertinent History diabetes, prostate cancer, CKD 3, diabetic neuropathy, morbid obesity, hypertension, hyperlipidemia, Hx of ankle fracture    Patient Stated Goals Pt's goals for therapy are to improve steadiness.    Currently in Pain? No/denies    Pain Score 0-No pain                  [facility] Adult PT Treatment/Exercise - 02/16/20 1239      Transfers   Transfers Sit to Stand;Stand to Sit    Sit to Stand 5: Supervision;Without upper extremity assist;From chair/3-in-1    Stand to Sit 5: Supervision;Without upper extremity assist;To chair/3-in-1      Ambulation/Gait   Ambulation/Gait Yes    Ambulation/Gait Assistance 5: Supervision    Ambulation/Gait Assistance Details around gym with session    Assistive device Straight cane    Gait Pattern Step-through pattern;Wide base of support;Trendelenburg    Ambulation Surface Level;Indoor      Neuro Re-ed    Neuro Re-ed Details  for balance/muscle re-ed: gait around track for 2 laps with scanning all directions randomly, min guard assist with cane with no balance loss noted.  minor veering with decr gait speed with lateral scanning.            Vestibular Treatment/Exercise - 02/16/20 1246      Vestibular Treatment/Exercise   Vestibular Treatment Provided Gaze    Gaze Exercises X1 Viewing Horizontal;X1 Viewing Vertical      X1 Viewing Horizontal   Foot Position seated>progressing to standing    Time --   30 sec's   Reps --   x1 in each position   Comments no symptoms with seated. only reported fatigue with standing with feet apart.       X1 Viewing Vertical   Foot Position seated>progressing to standing    Time --   30 sec's   Reps --   x1 in each position   Comments no symptoms with seated. only reported fatigue with standing with feet wide apart.              Balance Exercises - 02/16/20 1259       Balance Exercises: Standing   Standing Eyes Closed Wide (BOA);Solid surface;Other reps (comment);30 secs;Limitations    Standing Eyes Closed Limitations on floor with feet apart: EC no head movements, progressing to EC head movements left<>right, up<>down. min guard assist for balance with mild increase in postural sway with EC.     Partial Tandem Stance Eyes closed;2 reps;20 secs;Limitations    Partial Tandem Stance Limitations 2 reps each foot forward with mild "wooziness" reported that resolved with rest breaks.                   PT Long Term Goals - 02/04/20 1646      PT LONG TERM GOAL #1   Title Pt will be independent with progression of HEP for improved strength, balance, transfers, and gait.  TARGET 03/04/2020    Time 4    Period Weeks    Status On-going      PT LONG TERM GOAL #2   Title Pt will report 50% decrease in dizziness/wooziness with gait activities in the home for improved overall functional mobility.    Time 4    Period Weeks    Status New      PT LONG TERM GOAL #3   Title Pt will improve DGI score to at least 19/24 to decrease fall risk.    Baseline 12/04/19: 19/24 scored today; had decreased to 15/24 after ED visit/hold from therapy 12/24/2019 (unable to assess 02/04/2020 due to time constraints)    Time 4    Period Weeks    Status On-going      PT LONG TERM GOAL #4   Title Pt will ambulate at least 1000 ft, indoors and outdoors with cane, mod independently, for return to community gait.    Time 4    Period Weeks    Status New      PT LONG TERM GOAL #5   Title Pt/wife will verbalize understanding of plans for transition to community fitness upon d/c from PT, to maximize gains made in therapy.    Time 4    Period Weeks    Status On-going                 Plan - 02/16/20 1239    Clinical Impression Statement Today's skilled session focused on activities that require an increase vestibular imput for balance with frequent rest breaks needed due to  fatigue with standing activiites. Otherwise pt only reported symptoms with modified tandem standing with eyes closed that resolved  quickly with seated rest breaks. The pt is progressing toward goals and should benefit from continued PT to progress toward unmet goals.    Personal Factors and Comorbidities Comorbidity 3+    Comorbidities PMH: diabetes, prostate cancer, CKD 3, diabetic neuropathy, morbid obesity, hypertension, hyperlipidemia    Examination-Activity Limitations Stand;Transfers;Locomotion Level    Examination-Participation Restrictions Community Activity;Driving    Stability/Clinical Decision Making Evolving/Moderate complexity    Rehab Potential Good    PT Frequency 2x / week    PT Duration 4 weeks   per recert 69/10/9478   PT Treatment/Interventions ADLs/Self Care Home Management;Gait training;Stair training;Functional mobility training;Therapeutic activities;Therapeutic exercise;Balance training;Neuromuscular re-education;Patient/family education    PT Next Visit Plan Corner balance, standing EO/EC foam, work on compliant surfaces; try x2 viewing ex's, and add to HEP if appropriate; make sure pt is walking at home as part of HEP    Consulted and Agree with Plan of Care Patient;Family member/caregiver    Family Member Consulted wife           Patient will benefit from skilled therapeutic intervention in order to improve the following deficits and impairments:  Abnormal gait, Difficulty walking, Decreased activity tolerance, Decreased endurance, Decreased balance, Decreased mobility, Decreased strength  Visit Diagnosis: Muscle weakness (generalized)  Unsteadiness on feet  Other abnormalities of gait and mobility  Dizziness and giddiness     Problem List Patient Active Problem List   Diagnosis Date Noted  . Acute on chronic renal failure ([place]) 10/14/2019  . Acute ischemic left MCA stroke ([place]) 10/07/2019  . Morbid obesity ([place])   . NSVT (nonsustained ventricular  tachycardia) ([place])   . Diabetic peripheral neuropathy ([place])   . Acute ischemic stroke ([place]) 10/04/2019  . Hypoglycemia due to insulin 10/04/2019  . CKD (chronic kidney disease), stage III ([place]) 10/04/2019  . Dehydration 10/04/2019  . Dyslipidemia 10/04/2019  . Stroke ([place]) 10/04/2019  . Ankle fracture, left 02/29/2016  . Closed left ankle fracture 02/29/2016  . Gout 02/20/2016  . Essential hypertension 02/20/2016    Willow Ora, PTA, [facility] [address] [place], [place] [address] [phone] 02/17/20, 4:21 PM   Name: Nathan Proctor. MRN: [id number] Date of Birth: [birth year]

## 2020-02-23 ENCOUNTER — Ambulatory Visit: Payer: Medicare PPO | Admitting: Physical Therapy

## 2020-02-23 ENCOUNTER — Encounter: Payer: Self-pay | Admitting: Physical Therapy

## 2020-02-23 ENCOUNTER — Other Ambulatory Visit: Payer: Self-pay

## 2020-02-23 DIAGNOSIS — R2681 Unsteadiness on feet: Secondary | ICD-10-CM

## 2020-02-23 DIAGNOSIS — R42 Dizziness and giddiness: Secondary | ICD-10-CM

## 2020-02-23 DIAGNOSIS — R2689 Other abnormalities of gait and mobility: Secondary | ICD-10-CM

## 2020-02-23 DIAGNOSIS — M6281 Muscle weakness (generalized): Secondary | ICD-10-CM

## 2020-02-24 NOTE — Therapy (Signed)
[facility] [address] [place], [place], [address] Phone: [phone]   Fax:  [phone]  Physical Therapy Treatment  Patient Details  Name: Nathan Proctor. MRN: [id number] Date of Birth: 07/05/1940 Referring Provider (PT): Dr. Letta Pate   Encounter Date: 02/23/2020   PT End of Session - 02/23/20 1238    Visit Number 16    Number of Visits 22    Date for PT Re-Evaluation 72/53/66   60 day cert for 4 week POC   Authorization Type [employer]-submitted additional paper after 02/04/2020 visit requesting 8 more visits    Progress Note Due on Visit 20    PT Start Time 1234    PT Stop Time 1315    PT Time Calculation (min) 41 min    Equipment Utilized During Treatment Gait belt    Activity Tolerance Patient tolerated treatment well    Behavior During Therapy WFL for tasks assessed/performed           Past Medical History:  Diagnosis Date  . Anemia of chronic disease   . Ankle fracture 02/15/2016  . Cancer [facility])    Prostate  . Chronic constipation   . Chronic kidney disease    stage III  . Diabetes mellitus without complication ([place])    type II   . Diabetic peripheral neuropathy ([place])   . Failure to thrive (0-17)   . Fracture of left lower leg   . Gout   . Hyperlipidemia   . Hypertension   . Morbid obesity ([place])   . Unstable gait     Past Surgical History:  Procedure Laterality Date  . ORIF ANKLE FRACTURE Left 02/29/2016   Procedure: OPEN REDUCTION INTERNAL FIXATION (ORIF) ANKLE FRACTURE;  Surgeon: Nicholes Stairs, MD;  [facility];  Service: Orthopedics;  Laterality: Left;  . PROSTATE SURGERY      There were no vitals filed for this visit.   Subjective Assessment - 02/23/20 1237    Subjective No new complaints. No falls or pain to report. Reports only getting dizzy when he turns quickly in bed. Reports walking around house more and to the mailbox 3-4 times a week.    Patient is  accompained by: Family member   spouse   Pertinent History diabetes, prostate cancer, CKD 3, diabetic neuropathy, morbid obesity, hypertension, hyperlipidemia, Hx of ankle fracture    Patient Stated Goals Pt's goals for therapy are to improve steadiness.    Currently in Pain? No/denies                 [facility] Adult PT Treatment/Exercise - 02/23/20 1238      Transfers   Transfers Sit to Stand;Stand to Sit    Sit to Stand 5: Supervision;Without upper extremity assist;From chair/3-in-1    Stand to Sit 5: Supervision;Without upper extremity assist;To chair/3-in-1      Ambulation/Gait   Ambulation/Gait Yes    Ambulation/Gait Assistance 5: Supervision;4: Min guard    Ambulation/Gait Assistance Details no balance issues noted. HR 118 after gait for 500 feet with mild shortness of breath reported.     Ambulation Distance (Feet) 500 Feet   x1   Assistive device Straight cane    Gait Pattern Step-through pattern;Wide base of support;Trendelenburg    Ambulation Surface Level;Indoor;Unlevel;Outdoor;Paved      Self-Care   Self-Care Other Self-Care Comments    Other Self-Care Comments  Discussesd walking program and ex's for home. Pt reports not walking consistently for ex at  home. Pt and spouse are to begin daily walks to mailbox together, and then progress to further distance as they are able too. Spouse reprots pt asks her to bring him things at home vs getting up to get them himself.  Discussed getting up himself to get these items as it will work toward increasing his activity, endurance and mobility. Pt's spouse in agreement to this, pt non commital. Both pt and spouse report he has been doing the standing head movement exercise at home.               Balance Exercises - 02/23/20 1253      Balance Exercises: Standing   Standing Eyes Closed Narrow base of support (BOS);Foam/compliant surface;30 secs;Limitations;Wide (BOA);Head turns    Standing Eyes Closed Limitations on airex with  no UE support: feet together for EC 30 sec's x 3 reps. Min guard assist to min assist for balance. Cues on weight shifting and posture to assist with balance.   Partial Tandem Stance Eyes closed;Foam/compliant surface;Limitations;2 reps;30 secs    Partial Tandem Stance Limitations on airex no UE support: 2 reps each foot forward with min guard to min assist for balance.     Sit to Stand Standard surface;Upper extremity support;Limitations;Foam/compliant surface    Sit to Stand Limitations seated with feet on airex: sit<>stands for 5 reps. Cues for full upright standing and controlled descent with sitting back down.                 PT Long Term Goals - 02/04/20 1646      PT LONG TERM GOAL #1   Title Pt will be independent with progression of HEP for improved strength, balance, transfers, and gait.  TARGET 03/04/2020    Time 4    Period Weeks    Status On-going      PT LONG TERM GOAL #2   Title Pt will report 50% decrease in dizziness/wooziness with gait activities in the home for improved overall functional mobility.    Time 4    Period Weeks    Status New      PT LONG TERM GOAL #3   Title Pt will improve DGI score to at least 19/24 to decrease fall risk.    Baseline 12/04/19: 19/24 scored today; had decreased to 15/24 after ED visit/hold from therapy 12/24/2019 (unable to assess 02/04/2020 due to time constraints)    Time 4    Period Weeks    Status On-going      PT LONG TERM GOAL #4   Title Pt will ambulate at least 1000 ft, indoors and outdoors with cane, mod independently, for return to community gait.    Time 4    Period Weeks    Status New      PT LONG TERM GOAL #5   Title Pt/wife will verbalize understanding of plans for transition to community fitness upon d/c from PT, to maximize gains made in therapy.    Time 4    Period Weeks    Status On-going             Plan - 02/23/20 1238    Clinical Impression Statement Today's skilled session continued to focus on  compliance with HEP/walking program at home, gait/activity tolerance and balance reactions with increased vestibular imput needed. Rest breaks needed due to fatigue and elevated HR after gait ffor 500 feet. HR remained stable with remainder of session. The pt is progressing toward goals and should benefit from continued PT  to progress toward unmet goals.    Personal Factors and Comorbidities Comorbidity 3+    Comorbidities PMH: diabetes, prostate cancer, CKD 3, diabetic neuropathy, morbid obesity, hypertension, hyperlipidemia    Examination-Activity Limitations Stand;Transfers;Locomotion Level    Examination-Participation Restrictions Community Activity;Driving    Stability/Clinical Decision Making Evolving/Moderate complexity    Rehab Potential Good    PT Frequency 2x / week    PT Duration 4 weeks   per recert 19/07/1738   PT Treatment/Interventions ADLs/Self Care Home Management;Gait training;Stair training;Functional mobility training;Therapeutic activities;Therapeutic exercise;Balance training;Neuromuscular re-education;Patient/family education    PT Next Visit Plan has pt and spouse been walking more? continued to work on gait, activity tolerance, and balance ex's. monitor HR with activity    Consulted and Agree with Plan of Care Patient;Family member/caregiver    Family Member Consulted wife                 Patient will benefit from skilled therapeutic intervention in order to improve the following deficits and impairments:  Abnormal gait, Difficulty walking, Decreased activity tolerance, Decreased endurance, Decreased balance, Decreased mobility, Decreased strength  Visit Diagnosis: Muscle weakness (generalized)  Unsteadiness on feet  Other abnormalities of gait and mobility  Dizziness and giddiness     Problem List Patient Active Problem List   Diagnosis Date Noted  . Acute on chronic renal failure ([place]) 10/14/2019  . Acute ischemic left MCA stroke ([place]) 10/07/2019    . Morbid obesity ([place])   . NSVT (nonsustained ventricular tachycardia) ([place])   . Diabetic peripheral neuropathy ([place])   . Acute ischemic stroke ([place]) 10/04/2019  . Hypoglycemia due to insulin 10/04/2019  . CKD (chronic kidney disease), stage III ([place]) 10/04/2019  . Dehydration 10/04/2019  . Dyslipidemia 10/04/2019  . Stroke ([place]) 10/04/2019  . Ankle fracture, left 02/29/2016  . Closed left ankle fracture 02/29/2016  . Gout 02/20/2016  . Essential hypertension 02/20/2016    Willow Ora, PTA, [facility] [address], [place], [place] [address] [phone] 02/24/20, 7:35 PM   Name: Gonsalo Cuthbertson. MRN: [id number] Date of Birth: 29-Jan-1941

## 2020-02-25 ENCOUNTER — Other Ambulatory Visit: Payer: Self-pay

## 2020-02-25 ENCOUNTER — Encounter: Payer: Self-pay | Admitting: Physical Therapy

## 2020-02-25 ENCOUNTER — Ambulatory Visit: Payer: Medicare PPO | Admitting: Physical Therapy

## 2020-02-25 DIAGNOSIS — M6281 Muscle weakness (generalized): Secondary | ICD-10-CM

## 2020-02-25 DIAGNOSIS — R2681 Unsteadiness on feet: Secondary | ICD-10-CM | POA: Diagnosis not present

## 2020-02-25 DIAGNOSIS — R42 Dizziness and giddiness: Secondary | ICD-10-CM

## 2020-02-25 DIAGNOSIS — R2689 Other abnormalities of gait and mobility: Secondary | ICD-10-CM

## 2020-02-26 NOTE — Therapy (Signed)
[facility] [address] [place], [place], [address] Phone: [phone]   Fax:  [phone]  Physical Therapy Treatment  Patient Details  Name: Nathan Proctor. MRN: [id number] Date of Birth: 10-30-1940 Referring Provider (PT): Dr. Letta Pate   Encounter Date: 02/25/2020   PT End of Session - 02/25/20 1026    Visit Number 17    Number of Visits 22    Date for PT Re-Evaluation 29/92/42   60 day cert for 4 week POC   Authorization Type [employer]-submitted additional paper after 02/04/2020 visit requesting 8 more visits    Progress Note Due on Visit 20    PT Start Time 1022   pt running late for session   PT Stop Time 1100    PT Time Calculation (min) 38 min    Equipment Utilized During Treatment Gait belt    Activity Tolerance Patient tolerated treatment well    Behavior During Therapy WFL for tasks assessed/performed           Past Medical History:  Diagnosis Date  . Anemia of chronic disease   . Ankle fracture 02/15/2016  . Cancer [facility])    Prostate  . Chronic constipation   . Chronic kidney disease    stage III  . Diabetes mellitus without complication ([place])    type II   . Diabetic peripheral neuropathy ([place])   . Failure to thrive (0-17)   . Fracture of left lower leg   . Gout   . Hyperlipidemia   . Hypertension   . Morbid obesity ([place])   . Unstable gait     Past Surgical History:  Procedure Laterality Date  . ORIF ANKLE FRACTURE Left 02/29/2016   Procedure: OPEN REDUCTION INTERNAL FIXATION (ORIF) ANKLE FRACTURE;  Surgeon: Nicholes Stairs, MD;  Location: [facility];  Service: Orthopedics;  Laterality: Left;  . PROSTATE SURGERY      There were no vitals filed for this visit.   Subjective Assessment - 02/25/20 1024    Subjective Had some swelling in the left thigh and arm. Saw the MD yesterday who placed him on a 10 day course of medicine to address this. No falls or pain to report.  Continues to report getting dizzy only with quick movements- rolling over in bed fast, standing up quickly. Does report they walked yesterday to mailbox and back.    Patient is accompained by: Family member   spouse   Pertinent History diabetes, prostate cancer, CKD 3, diabetic neuropathy, morbid obesity, hypertension, hyperlipidemia, Hx of ankle fracture                 [facility] Adult PT Treatment/Exercise - 02/25/20 1029      Transfers   Transfers Sit to Stand;Stand to Sit    Sit to Stand 5: Supervision;Without upper extremity assist;From chair/3-in-1    Stand to Sit 5: Supervision;Without upper extremity assist;To chair/3-in-1      Ambulation/Gait   Ambulation/Gait Yes    Ambulation/Gait Assistance 5: Supervision;4: Min guard   min guard for safety when fatigued.    Ambulation/Gait Assistance Details HR before gait-76 bpm, SaO2 98%.  HR 103, SaO2 99%, Dyspnea 3/4 after 500 feet with cane, min guard to supervision asisst.     Ambulation Distance (Feet) 500 Feet   x1,    Assistive device Straight cane    Gait Pattern Step-through pattern;Wide base of support;Trendelenburg    Ambulation Surface Level;Unlevel;Indoor;Outdoor;Paved      High Level Balance  High Level Balance Activities Side stepping;Marching forwards;Marching backwards;Tandem walking   tandem fwd/bwd   High Level Balance Comments on blue mat next to counter top: UE support on counter with min guard assist for 3 laps each/each way. Cues on posture, ex form and weight shifitng.                 Balance Exercises - 02/25/20 1049      Balance Exercises: Standing   Standing Eyes Closed Limitations on doubled blue mat with feet hip width apart, no UE support: EC 30 sec's x 3 reps, then EC head movements left<>right, up<>down for 10 reps each. min guard to min assist with posterior balance loss perference. cues for posture and weight shifitng needed.                    PT Long Term Goals - 02/04/20 1646        PT LONG TERM GOAL #1   Title Pt will be independent with progression of HEP for improved strength, balance, transfers, and gait.  TARGET 03/04/2020    Time 4    Period Weeks    Status On-going      PT LONG TERM GOAL #2   Title Pt will report 50% decrease in dizziness/wooziness with gait activities in the home for improved overall functional mobility.    Time 4    Period Weeks    Status New      PT LONG TERM GOAL #3   Title Pt will improve DGI score to at least 19/24 to decrease fall risk.    Baseline 12/04/19: 19/24 scored today; had decreased to 15/24 after ED visit/hold from therapy 12/24/2019 (unable to assess 02/04/2020 due to time constraints)    Time 4    Period Weeks    Status On-going      PT LONG TERM GOAL #4   Title Pt will ambulate at least 1000 ft, indoors and outdoors with cane, mod independently, for return to community gait.    Time 4    Period Weeks    Status New      PT LONG TERM GOAL #5   Title Pt/wife will verbalize understanding of plans for transition to community fitness upon d/c from PT, to maximize gains made in therapy.    Time 4    Period Weeks    Status On-going                 Plan - 02/25/20 1028    Clinical Impression Statement Today's skiled session continued to focus on activity tolerance with walking distance and balance training on compliant surfaces. Pt's HR continues to elevate with longer walking distances, recovers quickly with seated rest breaks. Pt continues to need up to min assist for balance on compliant surfaces when vision in removed. No dizziness reported this session. The pt should benefit from continued PT to progress toward unmet goals.    Personal Factors and Comorbidities Comorbidity 3+    Comorbidities PMH: diabetes, prostate cancer, CKD 3, diabetic neuropathy, morbid obesity, hypertension, hyperlipidemia    Examination-Activity Limitations Stand;Transfers;Locomotion Level    Examination-Participation Restrictions  Community Activity;Driving    Stability/Clinical Decision Making Evolving/Moderate complexity    Rehab Potential Good    PT Frequency 2x / week    PT Duration 4 weeks   per recert 57/11/4694   PT Treatment/Interventions ADLs/Self Care Home Management;Gait training;Stair training;Functional mobility training;Therapeutic activities;Therapeutic exercise;Balance training;Neuromuscular re-education;Patient/family education    PT Next Visit  Plan LTGs due 03/04/2020. continued to work on gait, activity tolerance, and balance ex's. monitor HR with activity    Consulted and Agree with Plan of Care Patient;Family member/caregiver    Family Member Consulted wife           Patient will benefit from skilled therapeutic intervention in order to improve the following deficits and impairments:  Abnormal gait, Difficulty walking, Decreased activity tolerance, Decreased endurance, Decreased balance, Decreased mobility, Decreased strength  Visit Diagnosis: Muscle weakness (generalized)  Unsteadiness on feet  Other abnormalities of gait and mobility  Dizziness and giddiness     Problem List Patient Active Problem List   Diagnosis Date Noted  . Acute on chronic renal failure ([place]) 10/14/2019  . Acute ischemic left MCA stroke ([place]) 10/07/2019  . Morbid obesity ([place])   . NSVT (nonsustained ventricular tachycardia) ([place])   . Diabetic peripheral neuropathy ([place])   . Acute ischemic stroke ([place]) 10/04/2019  . Hypoglycemia due to insulin 10/04/2019  . CKD (chronic kidney disease), stage III ([place]) 10/04/2019  . Dehydration 10/04/2019  . Dyslipidemia 10/04/2019  . Stroke ([place]) 10/04/2019  . Ankle fracture, left 02/29/2016  . Closed left ankle fracture 02/29/2016  . Gout 02/20/2016  . Essential hypertension 02/20/2016   Willow Ora, PTA, [facility] [address], [place], [place] [address] [phone] 02/26/20, 12:46 PM   Name: Nathan Proctor. MRN:  [id number] Date of Birth: Sep 25, 1940

## 2020-03-01 ENCOUNTER — Other Ambulatory Visit: Payer: Self-pay

## 2020-03-01 ENCOUNTER — Ambulatory Visit: Payer: Medicare PPO | Attending: Physical Medicine and Rehabilitation

## 2020-03-01 DIAGNOSIS — R2681 Unsteadiness on feet: Secondary | ICD-10-CM | POA: Insufficient documentation

## 2020-03-01 DIAGNOSIS — R2689 Other abnormalities of gait and mobility: Secondary | ICD-10-CM | POA: Insufficient documentation

## 2020-03-01 DIAGNOSIS — M6281 Muscle weakness (generalized): Secondary | ICD-10-CM | POA: Insufficient documentation

## 2020-03-01 DIAGNOSIS — R42 Dizziness and giddiness: Secondary | ICD-10-CM | POA: Insufficient documentation

## 2020-03-01 NOTE — Therapy (Signed)
[facility] [address] [place], [place], [address] Phone: [phone]   Fax:  [phone]  Physical Therapy Treatment  Patient Details  Name: Nathan Proctor. MRN: [id number] Date of Birth: 08-Jun-1940 Referring Provider (PT): Dr. Letta Pate   Encounter Date: 03/01/2020   PT End of Session - 03/01/20 1303    Visit Number 18    Number of Visits 22    Date for PT Re-Evaluation 27/51/70   60 day cert for 4 week POC   Authorization Type [employer]-submitted additional paper after 02/04/2020 visit requesting 8 more visits    Progress Note Due on Visit 20    PT Start Time 1230    PT Stop Time 1315    PT Time Calculation (min) 45 min    Equipment Utilized During Treatment Gait belt    Activity Tolerance Patient tolerated treatment well    Behavior During Therapy WFL for tasks assessed/performed           Past Medical History:  Diagnosis Date  . Anemia of chronic disease   . Ankle fracture 02/15/2016  . Cancer [facility])    Prostate  . Chronic constipation   . Chronic kidney disease    stage III  . Diabetes mellitus without complication ([place])    type II   . Diabetic peripheral neuropathy ([place])   . Failure to thrive (0-17)   . Fracture of left lower leg   . Gout   . Hyperlipidemia   . Hypertension   . Morbid obesity ([place])   . Unstable gait     Past Surgical History:  Procedure Laterality Date  . ORIF ANKLE FRACTURE Left 02/29/2016   Procedure: OPEN REDUCTION INTERNAL FIXATION (ORIF) ANKLE FRACTURE;  Surgeon: Nicholes Stairs, MD;  [facility];  Service: Orthopedics;  Laterality: Left;  . PROSTATE SURGERY      There were no vitals filed for this visit.   Subjective Assessment - 03/01/20 1239    Subjective Pt reports that his dizziness is leveling off. He still gets dizzy when he turns in bed.    Patient is accompained by: Family member   spouse   Pertinent History diabetes, prostate cancer,  CKD 3, diabetic neuropathy, morbid obesity, hypertension, hyperlipidemia, Hx of ankle fracture    Currently in Pain? No/denies               Gait training: 1 x 621' with SBA and no AD, BORG scale 15 (Hard) for Rate of perceived exertion Walking backwards: 4 x 20 feet no AD, CGA Walking laterally: 3 x 10 feet each way Walking over 4" obstacles: 2 obstacles: 4x leading with R leg, 4x leading with L leg   We discussed plan to go to [facility], we discussed gradual walking program with family being present   Gait training: 1 x 207' with SBA and no AD, BORG 17 Adequate rest breaks were needed to recover SOB after activities.                         PT Long Term Goals - 02/04/20 1646      PT LONG TERM GOAL #1   Title Pt will be independent with progression of HEP for improved strength, balance, transfers, and gait.  TARGET 03/04/2020    Time 4    Period Weeks    Status On-going      PT LONG TERM GOAL #2   Title Pt will  report 50% decrease in dizziness/wooziness with gait activities in the home for improved overall functional mobility.    Time 4    Period Weeks    Status New      PT LONG TERM GOAL #3   Title Pt will improve DGI score to at least 19/24 to decrease fall risk.    Baseline 12/04/19: 19/24 scored today; had decreased to 15/24 after ED visit/hold from therapy 12/24/2019 (unable to assess 02/04/2020 due to time constraints)    Time 4    Period Weeks    Status On-going      PT LONG TERM GOAL #4   Title Pt will ambulate at least 1000 ft, indoors and outdoors with cane, mod independently, for return to community gait.    Time 4    Period Weeks    Status New      PT LONG TERM GOAL #5   Title Pt/wife will verbalize understanding of plans for transition to community fitness upon d/c from PT, to maximize gains made in therapy.    Time 4    Period Weeks    Status On-going                 Plan - 03/01/20 1302    Clinical Impression Statement  Today's skilled session was focused on improving walking endurance and educating patient in transitioning into community with goals of joining [employer] or haing an indepdent walkinga and exercise program to continue to manage symptoms independently.    Comorbidities PMH: diabetes, prostate cancer, CKD 3, diabetic neuropathy, morbid obesity, hypertension, hyperlipidemia    Examination-Activity Limitations Stand;Transfers;Locomotion Level    Examination-Participation Restrictions Community Activity;Driving    Stability/Clinical Decision Making Evolving/Moderate complexity    Clinical Decision Making Moderate    Rehab Potential Good    PT Frequency 2x / week    PT Duration 4 weeks    PT Treatment/Interventions ADLs/Self Care Home Management;Gait training;Stair training;Functional mobility training;Therapeutic activities;Therapeutic exercise;Balance training;Neuromuscular re-education;Patient/family education    PT Next Visit Plan continued to work on gait, activity tolerance, and balance ex's. monitor HR with activity    Consulted and Agree with Plan of Care Patient;Family member/caregiver    Family Member Consulted wife           Patient will benefit from skilled therapeutic intervention in order to improve the following deficits and impairments:  Abnormal gait, Difficulty walking, Decreased activity tolerance, Decreased endurance, Decreased balance, Decreased mobility, Decreased strength  Visit Diagnosis: Muscle weakness (generalized)  Unsteadiness on feet  Other abnormalities of gait and mobility  Dizziness and giddiness     Problem List Patient Active Problem List   Diagnosis Date Noted  . Acute on chronic renal failure ([place]) 10/14/2019  . Acute ischemic left MCA stroke ([place]) 10/07/2019  . Morbid obesity ([place])   . NSVT (nonsustained ventricular tachycardia) ([place])   . Diabetic peripheral neuropathy ([place])   . Acute ischemic stroke ([place]) 10/04/2019  . Hypoglycemia due to insulin  10/04/2019  . CKD (chronic kidney disease), stage III ([place]) 10/04/2019  . Dehydration 10/04/2019  . Dyslipidemia 10/04/2019  . Stroke ([place]) 10/04/2019  . Ankle fracture, left 02/29/2016  . Closed left ankle fracture 02/29/2016  . Gout 02/20/2016  . Essential hypertension 02/20/2016    Kerrie Pleasure, PT 03/01/2020, 1:09 PM  [facility] [address] [place], [place], [address] Phone: [phone]   Fax:  [phone]  Name: Nathan Proctor. MRN: [id number] Date of Birth:  12/01/1940   

## 2020-03-03 ENCOUNTER — Ambulatory Visit: Payer: Medicare PPO | Admitting: Physical Therapy

## 2020-03-03 ENCOUNTER — Other Ambulatory Visit: Payer: Self-pay

## 2020-03-03 DIAGNOSIS — Z23 Encounter for immunization: Secondary | ICD-10-CM | POA: Diagnosis not present

## 2020-03-03 DIAGNOSIS — R2689 Other abnormalities of gait and mobility: Secondary | ICD-10-CM | POA: Diagnosis not present

## 2020-03-03 DIAGNOSIS — L02426 Furuncle of left lower limb: Secondary | ICD-10-CM | POA: Diagnosis not present

## 2020-03-03 DIAGNOSIS — E1142 Type 2 diabetes mellitus with diabetic polyneuropathy: Secondary | ICD-10-CM | POA: Diagnosis not present

## 2020-03-03 DIAGNOSIS — R2681 Unsteadiness on feet: Secondary | ICD-10-CM | POA: Diagnosis not present

## 2020-03-03 DIAGNOSIS — R42 Dizziness and giddiness: Secondary | ICD-10-CM | POA: Diagnosis not present

## 2020-03-03 DIAGNOSIS — I10 Essential (primary) hypertension: Secondary | ICD-10-CM | POA: Diagnosis not present

## 2020-03-03 DIAGNOSIS — G589 Mononeuropathy, unspecified: Secondary | ICD-10-CM | POA: Diagnosis not present

## 2020-03-03 DIAGNOSIS — M6281 Muscle weakness (generalized): Secondary | ICD-10-CM | POA: Diagnosis not present

## 2020-03-03 NOTE — Patient Instructions (Signed)
Access Code: [email or web address] URL: [email or web address] Date: 03/03/2020 Prepared by: Mady Haagensen  Exercises Standing Heel Raise with Support - 1 x daily - 7 x weekly - 2 sets - 10 reps Side Stepping with Unilateral Counter Support - 1 x daily - 7 x weekly - 1 sets - 10 reps Backward Walking with Counter Support - 1 x daily - 7 x weekly - 3 sets - 10 reps Standing Marching - 1 x daily - 7 x weekly - 1 sets - 10 reps Walking with Head Rotation - 1-2 x daily - 5 x weekly - 1 sets - 3 reps

## 2020-03-03 NOTE — Therapy (Signed)
[facility] [address] [place], [place], [address] Phone: [phone]   Fax:  [phone]  Physical Therapy Treatment  Patient Details  Name: Nathan Proctor. MRN: [id number] Date of Birth: 25-Nov-1940 Referring Provider (PT): Dr. Letta Pate   Encounter Date: 03/03/2020   PT End of Session - 03/03/20 1634    Visit Number 19    Number of Visits 22    Date for PT Re-Evaluation 44/03/47   60 day cert for 4 week POC   Authorization Type [employer] Medicare-submitted additional paper after 02/04/2020 visit requesting 8 more visits    Progress Note Due on Visit 20    PT Start Time 1406    PT Stop Time 1445    PT Time Calculation (min) 39 min    Activity Tolerance Patient tolerated treatment well    Behavior During Therapy [facility] for tasks assessed/performed           Past Medical History:  Diagnosis Date  . Anemia of chronic disease   . Ankle fracture 02/15/2016  . Cancer [facility])    Prostate  . Chronic constipation   . Chronic kidney disease    stage III  . Diabetes mellitus without complication ([place])    type II   . Diabetic peripheral neuropathy ([place])   . Failure to thrive (0-17)   . Fracture of left lower leg   . Gout   . Hyperlipidemia   . Hypertension   . Morbid obesity ([place])   . Unstable gait     Past Surgical History:  Procedure Laterality Date  . ORIF ANKLE FRACTURE Left 02/29/2016   Procedure: OPEN REDUCTION INTERNAL FIXATION (ORIF) ANKLE FRACTURE;  Surgeon: Nicholes Stairs, MD;  [facility];  Service: Orthopedics;  Laterality: Left;  . PROSTATE SURGERY      There were no vitals filed for this visit.   Subjective Assessment - 03/03/20 1409    Subjective Doing a little more walking.  Dizziness comes and goes with turning over.    Patient is accompained by: Family member   spouse   Pertinent History diabetes, prostate cancer, CKD 3, diabetic neuropathy, morbid obesity, hypertension,  hyperlipidemia, Hx of ankle fracture    Currently in Pain? No/denies                             [facility] Adult PT Treatment/Exercise - 03/03/20 0001      Ambulation/Gait   Ambulation/Gait Yes    Ambulation/Gait Assistance 5: Supervision;4: Min guard    Ambulation Distance (Feet) 600 Feet    Assistive device Straight cane    Gait Pattern Step-through pattern;Wide base of support;Trendelenburg    Ambulation Surface Level;Indoor;Unlevel    Stairs Yes    Stairs Assistance 6: Modified independent (Device/Increase time)    Stair Management Technique Two rails;Alternating pattern;Forwards    Number of Stairs 4    Height of Stairs 6    Ramp 6: Modified independent (Device)   2 reps using cane   Curb Other (comment)   Min Guard, pt uses cane and window sill for support   Gait Comments 98% O2, 79 HR pre-gait; HR after gait 107 ft, 100% O2.      Self-Care   Self-Care Other Self-Care Comments    Other Self-Care Comments  Discussed plans for d/c next week.  Discussed continued importance of HEP performance as well as transition to community fitness.  He either plans  to join [employer] or [employer]; suggested he "replace" PT time with time in the gym, for 2x/wk to start.          With distance gait, rates dyspnea as mild shortness of breath, RPE 13 (somewhat hard) Additional gait, short distances with speeding up/slowing down, head turns, head nods, normal speed, with cane.  Noted slowing with head turns/head nods.     Balance Exercises - 03/03/20 0001      Balance Exercises: Standing   Standing Eyes Opened Wide (BOA);Solid surface;5 reps;Narrow base of support (BOS);Head turns   Head nods   Gait with Head Turns Forward;4 reps;Intermittent upper extremity support   At counter            Exercises Standing Heel Raise with Support - 1 x daily - 7 x weekly - 2 sets - 10 reps Side Stepping with Unilateral Counter Support - 1 x daily - 7 x weekly - 1 sets - 10 reps Backward  Walking with Counter Support - 1 x daily - 7 x weekly - 3 sets - 10 reps Standing Marching - 1 x daily - 7 x weekly - 1 sets - 10 reps (Forward/back along counter)        PT Long Term Goals - 03/03/20 1438      PT LONG TERM GOAL #1   Title Pt will be independent with progression of HEP for improved strength, balance, transfers, and gait.  TARGET 03/04/2020    Time 4    Period Weeks    Status On-going      PT LONG TERM GOAL #2   Title Pt will report 50% decrease in dizziness/wooziness with gait activities in the home for improved overall functional mobility.    Time 4    Period Weeks    Status New      PT LONG TERM GOAL #3   Title Pt will improve DGI score to at least 19/24 to decrease fall risk.    Baseline 12/04/19: 19/24 scored today; had decreased to 15/24 after ED visit/hold from therapy 12/24/2019 (unable to assess 02/04/2020 due to time constraints)    Time 4    Period Weeks    Status On-going      PT LONG TERM GOAL #4   Title Pt will ambulate at least 1000 ft, indoors and outdoors with cane, mod independently, for return to community gait.    Time 4    Period Weeks    Status New      PT LONG TERM GOAL #5   Title Pt/wife will verbalize understanding of plans for transition to community fitness upon d/c from PT, to maximize gains made in therapy.    Time 4    Period Weeks    Status On-going                 Plan - 03/03/20 1635    Clinical Impression Statement No c/o dizziness today with head turns with gait and standing activities.  Pt able to ambulate 600 ft today, including curb, ramp, stair negotiation prior to needing seated break.  Did not go outdoors due to weather.  Pt progressing towards LTGs, and will formally asses and plan for d/c next visit.    Comorbidities PMH: diabetes, prostate cancer, CKD 3, diabetic neuropathy, morbid obesity, hypertension, hyperlipidemia    Examination-Activity Limitations Stand;Transfers;Locomotion Level     Examination-Participation Restrictions Community Activity;Driving    Stability/Clinical Decision Making Evolving/Moderate complexity    Rehab Potential Good  PT Frequency 2x / week    PT Duration 4 weeks    PT Treatment/Interventions ADLs/Self Care Home Management;Gait training;Stair training;Functional mobility training;Therapeutic activities;Therapeutic exercise;Balance training;Neuromuscular re-education;Patient/family education    PT Next Visit Plan Check LTGs and plan for d/c next visit.    Consulted and Agree with Plan of Care Patient;Family member/caregiver    Family Member Consulted wife           Patient will benefit from skilled therapeutic intervention in order to improve the following deficits and impairments:  Abnormal gait, Difficulty walking, Decreased activity tolerance, Decreased endurance, Decreased balance, Decreased mobility, Decreased strength  Visit Diagnosis: Other abnormalities of gait and mobility  Unsteadiness on feet  Muscle weakness (generalized)     Problem List Patient Active Problem List   Diagnosis Date Noted  . Acute on chronic renal failure ([place]) 10/14/2019  . Acute ischemic left MCA stroke ([place]) 10/07/2019  . Morbid obesity ([place])   . NSVT (nonsustained ventricular tachycardia) ([place])   . Diabetic peripheral neuropathy ([place])   . Acute ischemic stroke ([place]) 10/04/2019  . Hypoglycemia due to insulin 10/04/2019  . CKD (chronic kidney disease), stage III ([place]) 10/04/2019  . Dehydration 10/04/2019  . Dyslipidemia 10/04/2019  . Stroke ([place]) 10/04/2019  . Ankle fracture, left 02/29/2016  . Closed left ankle fracture 02/29/2016  . Gout 02/20/2016  . Essential hypertension 02/20/2016    Claudetta Sallie W. 03/03/2020, 4:38 PM Frazier Butt., PT  [facility] [address] [place], [place], [address] Phone: [phone]   Fax:  [phone]  Name: Jamieson Hetland. MRN:  [id number] Date of Birth: [birth year]

## 2020-03-08 ENCOUNTER — Other Ambulatory Visit: Payer: Self-pay

## 2020-03-08 ENCOUNTER — Ambulatory Visit: Payer: Medicare PPO | Admitting: Physical Therapy

## 2020-03-08 DIAGNOSIS — R2681 Unsteadiness on feet: Secondary | ICD-10-CM | POA: Diagnosis not present

## 2020-03-08 DIAGNOSIS — R2689 Other abnormalities of gait and mobility: Secondary | ICD-10-CM | POA: Diagnosis not present

## 2020-03-08 DIAGNOSIS — M6281 Muscle weakness (generalized): Secondary | ICD-10-CM | POA: Diagnosis not present

## 2020-03-08 DIAGNOSIS — R42 Dizziness and giddiness: Secondary | ICD-10-CM | POA: Diagnosis not present

## 2020-03-08 NOTE — Therapy (Signed)
[facility] [address] [place], [place], [address] Phone: [phone]   Fax:  [phone]  Physical Therapy Treatment/Discharge Summary  Patient Details  Name: Nathan Proctor. MRN: [id number] Date of Birth: 01/24/1941 Referring Provider (PT): Dr. Letta Pate   Encounter Date: 03/08/2020   PT End of Session - 03/08/20 2028    Visit Number 20    Number of Visits 22    Date for PT Re-Evaluation 69/62/95   60 day cert for 4 week POC   Authorization Type [employer]-submitted additional paper after 02/04/2020 visit requesting 8 more visits    Progress Note Due on Visit 20    PT Start Time 2841   Late pt arrival   PT Stop Time 1309   discharge visit   PT Time Calculation (min) 34 min    Activity Tolerance Patient tolerated treatment well    Behavior During Therapy [facility] for tasks assessed/performed           Past Medical History:  Diagnosis Date  . Anemia of chronic disease   . Ankle fracture 02/15/2016  . Cancer [facility])    Prostate  . Chronic constipation   . Chronic kidney disease    stage III  . Diabetes mellitus without complication ([place])    type II   . Diabetic peripheral neuropathy ([place])   . Failure to thrive (0-17)   . Fracture of left lower leg   . Gout   . Hyperlipidemia   . Hypertension   . Morbid obesity ([place])   . Unstable gait     Past Surgical History:  Procedure Laterality Date  . ORIF ANKLE FRACTURE Left 02/29/2016   Procedure: OPEN REDUCTION INTERNAL FIXATION (ORIF) ANKLE FRACTURE;  Surgeon: Nicholes Stairs, MD;  Location: [facility];  Service: Orthopedics;  Laterality: Left;  . PROSTATE SURGERY      There were no vitals filed for this visit.   Subjective Assessment - 03/08/20 1237    Subjective No falls, no changes.  Plan to get back to [facility], with Silver [employer]neakers.  Walked into [employer] last week, for the first time in a long time.    Patient is accompained by: Family member    spouse   Pertinent History diabetes, prostate cancer, CKD 3, diabetic neuropathy, morbid obesity, hypertension, hyperlipidemia, Hx of ankle fracture    Patient Stated Goals Pt's goals for therapy are to improve steadiness.    Currently in Pain? No/denies                             [facility] Adult PT Treatment/Exercise - 03/08/20 0001      Ambulation/Gait   Ambulation/Gait Yes    Ambulation/Gait Assistance 5: Supervision;6: Modified independent (Device/Increase time)    Ambulation Distance (Feet) 250 Feet   then 500 ft, outdoors   Assistive device Straight cane    Gait Pattern Step-through pattern;Wide base of support;Trendelenburg    Ambulation Surface Level;Indoor;Unlevel;Outdoor      Dynamic Gait Index   Level Surface Mild Impairment    Change in Gait Speed Mild Impairment    Gait with Horizontal Head Turns Mild Impairment    Gait with Vertical Head Turns Mild Impairment    Gait and Pivot Turn Mild Impairment    Step Over Obstacle Mild Impairment    Step Around Obstacles Mild Impairment    Steps Mild Impairment    Total Score 16  High Level Balance   High Level Balance Comments Reviewed update to HEP last visit:  walking along counter with head turns, then head nods, then 20 ft with cane in gym:  head nods, head turns with supervision.      Self-Care   Self-Care Other Self-Care Comments    Other Self-Care Comments  REviewed pt's plans for transition to community fitness.  Discussed POC, progress towards goals, and plans for d/c.            FOTO score/questionnaire completed and PT discussed results, FOTO score 60.        PT Education - 03/08/20 2028    Education Details Progress towards goals, improvement in FOTO score (60), POC and plans for d/c    Person(s) Educated Patient;Spouse    Methods Explanation    Comprehension Verbalized understanding               PT Long Term Goals - 03/08/20 1238      PT LONG TERM GOAL #1   Title Pt  will be independent with progression of HEP for improved strength, balance, transfers, and gait.  TARGET 03/04/2020    Time 4    Period Weeks    Status Achieved      PT LONG TERM GOAL #2   Title Pt will report 50% decrease in dizziness/wooziness with gait activities in the home for improved overall functional mobility.    Baseline reports 65%-70% improvement in dizziness/wooziness with bed mobility activities    Time 4    Period Weeks    Status Achieved      PT LONG TERM GOAL #3   Title Pt will improve DGI score to at least 19/24 to decrease fall risk.    Baseline 16/24  03/08/2020    Time 4    Period Weeks    Status Not Met      PT LONG TERM GOAL #4   Title Pt will ambulate at least 1000 ft, indoors and outdoors with cane, mod independently, for return to community gait.    Baseline 500 ft 03/08/2020    Time 4    Period Weeks    Status Not Met      PT LONG TERM GOAL #5   Title Pt/wife will verbalize understanding of plans for transition to community fitness upon d/c from PT, to maximize gains made in therapy.    Time 4    Period Weeks    Status Achieved                 Plan - 03/08/20 2031    Clinical Impression Statement Assessed goals this visit, with pt meeting 3 of 5 LTGs.  LTG 1, 2, 5 met; LTG 3 and 4 not met, with pt requiring cane for DGI and pt ambulating 500 ft indoor and outdoor surfaces prior to fatigue and requesting rest.  Pt continues to have limitations in endurance.  He is appropriate for d/c at this time.    Comorbidities PMH: diabetes, prostate cancer, CKD 3, diabetic neuropathy, morbid obesity, hypertension, hyperlipidemia    Examination-Activity Limitations Stand;Transfers;Locomotion Level    Examination-Participation Restrictions Community Activity;Driving    Stability/Clinical Decision Making Evolving/Moderate complexity    Rehab Potential Good    PT Frequency 2x / week    PT Duration 4 weeks    PT Treatment/Interventions ADLs/Self Care Home  Management;Gait training;Stair training;Functional mobility training;Therapeutic activities;Therapeutic exercise;Balance training;Neuromuscular re-education;Patient/family education    PT Next Visit Plan D/C this visit  Consulted and Agree with Plan of Care Patient;Family member/caregiver    Family Member Consulted wife           Patient will benefit from skilled therapeutic intervention in order to improve the following deficits and impairments:  Abnormal gait, Difficulty walking, Decreased activity tolerance, Decreased endurance, Decreased balance, Decreased mobility, Decreased strength  Visit Diagnosis: Other abnormalities of gait and mobility  Unsteadiness on feet     Problem List Patient Active Problem List   Diagnosis Date Noted  . Acute on chronic renal failure ([place]) 10/14/2019  . Acute ischemic left MCA stroke ([place]) 10/07/2019  . Morbid obesity ([place])   . NSVT (nonsustained ventricular tachycardia) ([place])   . Diabetic peripheral neuropathy ([place])   . Acute ischemic stroke ([place]) 10/04/2019  . Hypoglycemia due to insulin 10/04/2019  . CKD (chronic kidney disease), stage III ([place]) 10/04/2019  . Dehydration 10/04/2019  . Dyslipidemia 10/04/2019  . Stroke ([place]) 10/04/2019  . Ankle fracture, left 02/29/2016  . Closed left ankle fracture 02/29/2016  . Gout 02/20/2016  . Essential hypertension 02/20/2016    Marijke Guadiana W. 03/08/2020, 8:33 PM  Frazier Butt., PT   [facility] [address] [place], [place], [address] Phone: [phone]   Fax:  [phone]  Name: Lemuel Boodram. MRN: [id number] Date of Birth: Apr 23, 1941  PHYSICAL THERAPY DISCHARGE SUMMARY  Visits from Start of Care: 20  Current functional level related to goals / functional outcomes:  PT Long Term Goals - 03/08/20 1238      PT LONG TERM GOAL #1   Title Pt will be independent with progression of HEP for improved strength,  balance, transfers, and gait.  TARGET 03/04/2020    Time 4    Period Weeks    Status Achieved      PT LONG TERM GOAL #2   Title Pt will report 50% decrease in dizziness/wooziness with gait activities in the home for improved overall functional mobility.    Baseline reports 65%-70% improvement in dizziness/wooziness with bed mobility activities    Time 4    Period Weeks    Status Achieved      PT LONG TERM GOAL #3   Title Pt will improve DGI score to at least 19/24 to decrease fall risk.    Baseline 16/24  03/08/2020    Time 4    Period Weeks    Status Not Met      PT LONG TERM GOAL #4   Title Pt will ambulate at least 1000 ft, indoors and outdoors with cane, mod independently, for return to community gait.    Baseline 500 ft 03/08/2020    Time 4    Period Weeks    Status Not Met      PT LONG TERM GOAL #5   Title Pt/wife will verbalize understanding of plans for transition to community fitness upon d/c from PT, to maximize gains made in therapy.    Time 4    Period Weeks    Status Achieved          Pt has met 3 of 5 LTGs.   Remaining deficits: Decreased endurance, occasional c/o dizziness   Education / Equipment: Educated in [employer] and plans for community fitness  Plan: Patient agrees to discharge.  Patient goals were partially met. Patient is being discharged due to being pleased with the current functional level.  ?????    Mady Haagensen, PT 03/08/20 8:35 PM Phone: [phone] Fax: [phone]

## 2020-03-10 ENCOUNTER — Ambulatory Visit: Payer: Medicare PPO | Admitting: Physical Therapy

## 2020-05-03 DIAGNOSIS — E7849 Other hyperlipidemia: Secondary | ICD-10-CM | POA: Diagnosis not present

## 2020-05-03 DIAGNOSIS — I6789 Other cerebrovascular disease: Secondary | ICD-10-CM | POA: Diagnosis not present

## 2020-05-03 DIAGNOSIS — I10 Essential (primary) hypertension: Secondary | ICD-10-CM | POA: Diagnosis not present

## 2020-05-03 DIAGNOSIS — E1142 Type 2 diabetes mellitus with diabetic polyneuropathy: Secondary | ICD-10-CM | POA: Diagnosis not present

## 2020-05-03 DIAGNOSIS — M109 Gout, unspecified: Secondary | ICD-10-CM | POA: Diagnosis not present

## 2020-05-05 DIAGNOSIS — E7849 Other hyperlipidemia: Secondary | ICD-10-CM | POA: Diagnosis not present

## 2020-05-05 DIAGNOSIS — M109 Gout, unspecified: Secondary | ICD-10-CM | POA: Diagnosis not present

## 2020-05-05 DIAGNOSIS — I10 Essential (primary) hypertension: Secondary | ICD-10-CM | POA: Diagnosis not present

## 2020-05-05 DIAGNOSIS — E1142 Type 2 diabetes mellitus with diabetic polyneuropathy: Secondary | ICD-10-CM | POA: Diagnosis not present

## 2020-05-05 DIAGNOSIS — Z0000 Encounter for general adult medical examination without abnormal findings: Secondary | ICD-10-CM | POA: Diagnosis not present

## 2020-05-05 DIAGNOSIS — I6789 Other cerebrovascular disease: Secondary | ICD-10-CM | POA: Diagnosis not present

## 2020-08-04 DIAGNOSIS — K5900 Constipation, unspecified: Secondary | ICD-10-CM | POA: Diagnosis not present

## 2020-08-04 DIAGNOSIS — E1142 Type 2 diabetes mellitus with diabetic polyneuropathy: Secondary | ICD-10-CM | POA: Diagnosis not present

## 2020-08-04 DIAGNOSIS — I10 Essential (primary) hypertension: Secondary | ICD-10-CM | POA: Diagnosis not present

## 2020-09-04 ENCOUNTER — Other Ambulatory Visit: Payer: Self-pay | Admitting: Physical Medicine and Rehabilitation

## 2020-11-24 DIAGNOSIS — Z6841 Body Mass Index (BMI) 40.0 and over, adult: Secondary | ICD-10-CM | POA: Diagnosis not present

## 2020-11-24 DIAGNOSIS — E1143 Type 2 diabetes mellitus with diabetic autonomic (poly)neuropathy: Secondary | ICD-10-CM | POA: Diagnosis not present

## 2020-11-24 DIAGNOSIS — I69351 Hemiplegia and hemiparesis following cerebral infarction affecting right dominant side: Secondary | ICD-10-CM | POA: Diagnosis not present

## 2020-11-24 DIAGNOSIS — E785 Hyperlipidemia, unspecified: Secondary | ICD-10-CM | POA: Diagnosis not present

## 2020-11-24 DIAGNOSIS — E1142 Type 2 diabetes mellitus with diabetic polyneuropathy: Secondary | ICD-10-CM | POA: Diagnosis not present

## 2020-11-24 DIAGNOSIS — Z794 Long term (current) use of insulin: Secondary | ICD-10-CM | POA: Diagnosis not present

## 2020-11-24 DIAGNOSIS — E1159 Type 2 diabetes mellitus with other circulatory complications: Secondary | ICD-10-CM | POA: Diagnosis not present

## 2020-11-24 DIAGNOSIS — I10 Essential (primary) hypertension: Secondary | ICD-10-CM | POA: Diagnosis not present

## 2020-11-24 DIAGNOSIS — I2510 Atherosclerotic heart disease of native coronary artery without angina pectoris: Secondary | ICD-10-CM | POA: Diagnosis not present

## 2021-05-31 ENCOUNTER — Other Ambulatory Visit: Payer: Self-pay | Admitting: Internal Medicine

## 2021-05-31 DIAGNOSIS — Z0000 Encounter for general adult medical examination without abnormal findings: Secondary | ICD-10-CM | POA: Diagnosis not present

## 2021-05-31 DIAGNOSIS — E1142 Type 2 diabetes mellitus with diabetic polyneuropathy: Secondary | ICD-10-CM | POA: Diagnosis not present

## 2021-05-31 DIAGNOSIS — M169 Osteoarthritis of hip, unspecified: Secondary | ICD-10-CM | POA: Diagnosis not present

## 2021-05-31 DIAGNOSIS — M109 Gout, unspecified: Secondary | ICD-10-CM | POA: Diagnosis not present

## 2021-05-31 DIAGNOSIS — E7849 Other hyperlipidemia: Secondary | ICD-10-CM | POA: Diagnosis not present

## 2021-05-31 DIAGNOSIS — I10 Essential (primary) hypertension: Secondary | ICD-10-CM | POA: Diagnosis not present

## 2021-05-31 LAB — LIPID PANEL
Cholesterol: 304 mg/dL — ABNORMAL HIGH (ref <200)
HDL: 60 mg/dL (ref >=40)
LDL Cholesterol (Calc): 223 mg/dL (calc) — ABNORMAL HIGH
Non-HDL Cholesterol (Calc): 244 mg/dL (calc) — ABNORMAL HIGH (ref <130)
Total CHOL/HDL Ratio: 5.1 (calc) — ABNORMAL HIGH (ref <5.0)
Triglycerides: 93 mg/dL (ref <150)

## 2021-05-31 LAB — COMPLETE METABOLIC PANEL WITH GFR
AG Ratio: 1.6 (calc) (ref 1.0–2.5)
ALT: 7 U/L — ABNORMAL LOW (ref 9–46)
AST: 12 U/L (ref 10–35)
Albumin: 4 g/dL (ref 3.6–5.1)
Alkaline phosphatase (APISO): 73 U/L (ref 35–144)
BUN/Creatinine Ratio: 15 (calc) (ref 6–22)
BUN: 24 mg/dL (ref 7–25)
CO2: 26 mmol/L (ref 20–32)
Calcium: 9.4 mg/dL (ref 8.6–10.3)
Chloride: 110 mmol/L (ref 98–110)
Creat: 1.59 mg/dL — ABNORMAL HIGH (ref 0.70–1.22)
Globulin: 2.5 g/dL (calc) (ref 1.9–3.7)
Glucose, Bld: 122 mg/dL — ABNORMAL HIGH (ref 65–99)
Potassium: 4.4 mmol/L (ref 3.5–5.3)
Sodium: 144 mmol/L (ref 135–146)
Total Bilirubin: 0.8 mg/dL (ref 0.2–1.2)
Total Protein: 6.5 g/dL (ref 6.1–8.1)
eGFR: 43 mL/min/{1.73_m2} — ABNORMAL LOW (ref >=60)

## 2021-05-31 LAB — CBC
HCT: 36.2 % — ABNORMAL LOW (ref 38.5–50.0)
Hemoglobin: 12.1 g/dL — ABNORMAL LOW (ref 13.2–17.1)
MCH: 31.1 pg (ref 27.0–33.0)
MCHC: 33.4 g/dL (ref 32.0–36.0)
MCV: 93.1 fL (ref 80.0–100.0)
MPV: 11.1 fL (ref 7.5–12.5)
Platelets: 189 10*3/uL (ref 140–400)
RBC: 3.89 10*6/uL — ABNORMAL LOW (ref 4.20–5.80)
RDW: 13.3 % (ref 11.0–15.0)
WBC: 5.1 10*3/uL (ref 3.8–10.8)

## 2021-05-31 LAB — TSH: TSH: 0.66 mIU/L (ref 0.40–4.50)

## 2021-05-31 LAB — URIC ACID: Uric Acid, Serum: 5.6 mg/dL (ref 4.0–8.0)

## 2021-06-28 DIAGNOSIS — E1142 Type 2 diabetes mellitus with diabetic polyneuropathy: Secondary | ICD-10-CM | POA: Diagnosis not present

## 2021-06-28 DIAGNOSIS — I10 Essential (primary) hypertension: Secondary | ICD-10-CM | POA: Diagnosis not present

## 2021-06-28 DIAGNOSIS — I6789 Other cerebrovascular disease: Secondary | ICD-10-CM | POA: Diagnosis not present

## 2021-06-28 DIAGNOSIS — M169 Osteoarthritis of hip, unspecified: Secondary | ICD-10-CM | POA: Diagnosis not present

## 2021-06-29 DIAGNOSIS — E1142 Type 2 diabetes mellitus with diabetic polyneuropathy: Secondary | ICD-10-CM | POA: Diagnosis not present

## 2021-06-29 DIAGNOSIS — I509 Heart failure, unspecified: Secondary | ICD-10-CM | POA: Diagnosis not present

## 2021-06-29 DIAGNOSIS — M109 Gout, unspecified: Secondary | ICD-10-CM | POA: Diagnosis not present

## 2021-06-29 DIAGNOSIS — I110 Hypertensive heart disease with heart failure: Secondary | ICD-10-CM | POA: Diagnosis not present

## 2021-06-29 DIAGNOSIS — I2510 Atherosclerotic heart disease of native coronary artery without angina pectoris: Secondary | ICD-10-CM | POA: Diagnosis not present

## 2021-06-29 DIAGNOSIS — Z6841 Body Mass Index (BMI) 40.0 and over, adult: Secondary | ICD-10-CM | POA: Diagnosis not present

## 2021-06-29 DIAGNOSIS — Z794 Long term (current) use of insulin: Secondary | ICD-10-CM | POA: Diagnosis not present

## 2021-06-29 DIAGNOSIS — E785 Hyperlipidemia, unspecified: Secondary | ICD-10-CM | POA: Diagnosis not present

## 2021-06-29 DIAGNOSIS — I69354 Hemiplegia and hemiparesis following cerebral infarction affecting left non-dominant side: Secondary | ICD-10-CM | POA: Diagnosis not present

## 2021-06-29 DIAGNOSIS — K5900 Constipation, unspecified: Secondary | ICD-10-CM | POA: Diagnosis not present

## 2021-06-29 DIAGNOSIS — E1136 Type 2 diabetes mellitus with diabetic cataract: Secondary | ICD-10-CM | POA: Diagnosis not present

## 2021-08-02 DIAGNOSIS — I10 Essential (primary) hypertension: Secondary | ICD-10-CM | POA: Diagnosis not present

## 2021-08-02 DIAGNOSIS — M169 Osteoarthritis of hip, unspecified: Secondary | ICD-10-CM | POA: Diagnosis not present

## 2021-08-02 DIAGNOSIS — E1142 Type 2 diabetes mellitus with diabetic polyneuropathy: Secondary | ICD-10-CM | POA: Diagnosis not present

## 2021-08-02 DIAGNOSIS — R32 Unspecified urinary incontinence: Secondary | ICD-10-CM | POA: Diagnosis not present

## 2021-11-01 DIAGNOSIS — K5900 Constipation, unspecified: Secondary | ICD-10-CM | POA: Diagnosis not present

## 2021-11-01 DIAGNOSIS — E1142 Type 2 diabetes mellitus with diabetic polyneuropathy: Secondary | ICD-10-CM | POA: Diagnosis not present

## 2021-11-01 DIAGNOSIS — I10 Essential (primary) hypertension: Secondary | ICD-10-CM | POA: Diagnosis not present

## 2022-01-25 DIAGNOSIS — J302 Other seasonal allergic rhinitis: Secondary | ICD-10-CM | POA: Diagnosis not present

## 2022-01-25 DIAGNOSIS — E1142 Type 2 diabetes mellitus with diabetic polyneuropathy: Secondary | ICD-10-CM | POA: Diagnosis not present

## 2022-01-25 DIAGNOSIS — K5900 Constipation, unspecified: Secondary | ICD-10-CM | POA: Diagnosis not present

## 2022-01-25 DIAGNOSIS — Z23 Encounter for immunization: Secondary | ICD-10-CM | POA: Diagnosis not present

## 2022-01-25 DIAGNOSIS — I6789 Other cerebrovascular disease: Secondary | ICD-10-CM | POA: Diagnosis not present

## 2022-01-25 DIAGNOSIS — I10 Essential (primary) hypertension: Secondary | ICD-10-CM | POA: Diagnosis not present

## 2022-04-01 ENCOUNTER — Ambulatory Visit
Admission: EM | Admit: 2022-04-01 | Discharge: 2022-04-01 | Disposition: A | Discharge status: Home / Self Care | Payer: Medicare HMO | Attending: Internal Medicine | Admitting: Internal Medicine

## 2022-04-01 DIAGNOSIS — R059 Cough, unspecified: Secondary | ICD-10-CM | POA: Diagnosis not present

## 2022-04-01 DIAGNOSIS — Z79899 Other long term (current) drug therapy: Secondary | ICD-10-CM | POA: Diagnosis not present

## 2022-04-01 DIAGNOSIS — Z1152 Encounter for screening for COVID-19: Secondary | ICD-10-CM | POA: Insufficient documentation

## 2022-04-01 DIAGNOSIS — S161XXA Strain of muscle, fascia and tendon at neck level, initial encounter: Secondary | ICD-10-CM

## 2022-04-01 DIAGNOSIS — J069 Acute upper respiratory infection, unspecified: Secondary | ICD-10-CM

## 2022-04-01 DIAGNOSIS — X58XXXA Exposure to other specified factors, initial encounter: Secondary | ICD-10-CM | POA: Insufficient documentation

## 2022-04-01 MED ORDER — FLUTICASONE PROPIONATE 50 MCG/ACT NA SUSP: 1.0000 | Freq: Every day | NASAL | 0 refills | Status: DC

## 2022-04-01 MED ORDER — BENZONATATE 100 MG PO CAPS: 100.0000 mg | ORAL_CAPSULE | Freq: Three times a day (TID) | ORAL | 0 refills | Status: DC | PRN

## 2022-04-01 NOTE — ED Provider Notes (Signed)
[facility]    CSN: [id number] Arrival date & time: 04/01/22  1342      History   Chief Complaint Chief Complaint  Patient presents with   head congesiton    HPI Nathan Proctor. is a 81 y.o. male.   Patient presents with cough, feelings of chest congestion, nasal congestion that started about 2 to 3 days ago.  Patient denies any known fevers or sick contacts.  Denies chest pain, shortness of breath, sore throat, ear pain, nausea, vomiting, diarrhea, abdominal pain.  He has taken Mucinex with minimal improvement.  Denies formal diagnosis of asthma or COPD.  Patient also reporting some left-sided neck pain that started this morning.  He denies any injury to the area.  He reports that he sleeps on the pillow "sideways" sometimes so is not sure if he has a muscle strain.  He has not taken any medications for pain. Denies any numbness or tingling.      Past Medical History:  Diagnosis Date   Anemia of chronic disease    Ankle fracture 02/15/2016   Cancer ([place])    Prostate   Chronic constipation    Chronic kidney disease    stage III   Diabetes mellitus without complication ([place])    type II    Diabetic peripheral neuropathy ([facility])    Failure to thrive (0-17)    Fracture of left lower leg    Gout    Hyperlipidemia    Hypertension    Morbid obesity ([place])    Unstable gait     Patient Active Problem List   Diagnosis Date Noted   Acute on chronic renal failure ([place]) 10/14/2019   Acute ischemic left MCA stroke ([place]) 10/07/2019   Morbid obesity ([place])    NSVT (nonsustained ventricular tachycardia) ([place])    Diabetic peripheral neuropathy ([place])    Acute ischemic stroke ([place]) 10/04/2019   Hypoglycemia due to insulin 10/04/2019   CKD (chronic kidney disease), stage III ([place]) 10/04/2019   Dehydration 10/04/2019   Dyslipidemia 10/04/2019   Stroke ([place]) 10/04/2019   Ankle fracture, left 02/29/2016   Closed left ankle fracture 02/29/2016   Gout 02/20/2016    Essential hypertension 02/20/2016    Past Surgical History:  Procedure Laterality Date   ORIF ANKLE FRACTURE Left 02/29/2016   Procedure: OPEN REDUCTION INTERNAL FIXATION (ORIF) ANKLE FRACTURE;  Surgeon: Nicholes Stairs, MD;  Location[facility];  Service: Orthopedics;  Laterality: Left;   PROSTATE SURGERY         Home Medications    Prior to Admission medications   Medication Sig Start Date End Date Taking? Authorizing Provider  benzonatate (TESSALON) 100 MG capsule Take 1 capsule (100 mg total) by mouth every 8 (eight) hours as needed for cough. 04/01/22  Yes Yuepheng Schaller, Hildred Alamin E, FNP  fluticasone (FLONASE) 50 MCG/ACT nasal spray Place 1 spray into both nostrils daily. 04/01/22  Yes Amberlie Gaillard, Hildred Alamin E, FNP  gabapentin (NEURONTIN) 300 MG capsule Take 300 mg by mouth 3 (three) times daily. 02/05/22  Yes [provider]  JARDIANCE 10 MG TABS tablet Take 10 mg by mouth daily. 03/31/22  Yes [provider]  acetaminophen (TYLENOL) 325 MG tablet Take 1-2 tablets (325-650 mg total) by mouth every 4 (four) hours as needed for mild pain. 10/14/19   Love, Ivan Anchors, PA-C  allopurinol (ZYLOPRIM) 100 MG tablet Take 100 mg by mouth daily.  01/25/16   [provider]  amLODipine (NORVASC) 2.5 MG tablet 10 mg  daily.  11/12/19   [provider]  aspirin EC 81 MG EC tablet Take 1 tablet (81 mg total) by mouth daily. 10/08/19   Danford, Suann Larry, MD  atorvastatin (LIPITOR) 80 MG tablet Take 1 tablet (80 mg total) by mouth daily. 10/14/19   Love, Ivan Anchors, PA-C  carvedilol (COREG) 25 MG tablet TAKE 1 TABLET BY MOUTH TWICE DAILY WITH A MEAL 09/05/20   Shirley Friar, PA-C  clopidogrel (PLAVIX) 75 MG tablet Take 1 tablet (75 mg total) by mouth daily. 10/14/19   Love, Ivan Anchors, PA-C  diclofenac Sodium (VOLTAREN) 1 % GEL  11/26/19   [provider]  insulin NPH-regular Human (70-30) 100 UNIT/ML injection Inject 5 Units into the skin daily. 10/14/19   Love, Ivan Anchors, PA-C   losartan (COZAAR) 25 MG tablet Take 1 tablet (25 mg total) by mouth daily. 12/23/19 03/22/20  Shirley Friar, PA-C  Vitamin D, Ergocalciferol, (DRISDOL) 50000 units CAPS capsule Take 50,000 Units by mouth every 7 (seven) days.     [provider]    Family History Family History  Problem Relation Age of Onset   Heart Problems Father    Heart Problems Sister     Social History Social History   Tobacco Use   Smoking status: Former    Types: Cigarettes    Quit date: 1979    Years since quitting: 44.9   Smokeless tobacco: Never  Vaping Use   Vaping Use: Former  Substance Use Topics   Alcohol use: Not Currently    Comment: Occasionally   Drug use: No     Allergies   Patient has no known allergies.   Review of Systems Review of Systems Per HPI  Physical Exam Triage Vital Signs ED Triage Vitals [04/01/22 1455]  Enc Vitals Group     BP (!) 167/90     Pulse Rate 86     Resp 16     Temp 98 F (36.7 C)     Temp Source Oral     SpO2 95 %     Weight      Height      Head Circumference      Peak Flow      Pain Score 5     Pain Loc      Pain Edu?      Excl. in [place]?    No data found.  Updated Vital Signs BP (!) 146/83   Pulse 86   Temp 98 F (36.7 C) (Oral)   Resp 16   SpO2 95%   Visual Acuity Right Eye Distance:   Left Eye Distance:   Bilateral Distance:    Right Eye Near:   Left Eye Near:    Bilateral Near:     Physical Exam Constitutional:      General: He is not in acute distress.    Appearance: Normal appearance. He is not toxic-appearing or diaphoretic.  HENT:     Head: Normocephalic and atraumatic.     Right Ear: Tympanic membrane and ear canal normal.     Left Ear: Tympanic membrane and ear canal normal.     Nose: Congestion present.     Mouth/Throat:     Mouth: Mucous membranes are moist.     Pharynx: No posterior oropharyngeal erythema.  Eyes:     Extraocular Movements: Extraocular movements intact.      Conjunctiva/sclera: Conjunctivae normal.     Pupils: Pupils are equal, round, and reactive to light.  Neck:     Comments: Tenderness to left posterior that extends to lateral neck.  No obvious swelling or discoloration noted.  No direct spinal tenderness or crepitus noted. Cardiovascular:     Rate and Rhythm: Normal rate and regular rhythm.     Pulses: Normal pulses.     Heart sounds: Normal heart sounds.  Pulmonary:     Effort: Pulmonary effort is normal. No respiratory distress.     Breath sounds: Normal breath sounds. No stridor. No wheezing, rhonchi or rales.  Abdominal:     General: Abdomen is flat. Bowel sounds are normal.     Palpations: Abdomen is soft.  Musculoskeletal:        General: Normal range of motion.     Cervical back: Normal range of motion. No edema or erythema. Pain with movement present. No spinous process tenderness or muscular tenderness.  Skin:    General: Skin is warm and dry.  Neurological:     General: No focal deficit present.     Mental Status: He is alert and oriented to person, place, and time. Mental status is at baseline.  Psychiatric:        Mood and Affect: Mood normal.        Behavior: Behavior normal.      UC Treatments / Results  Labs (all labs ordered are listed, but only abnormal results are displayed) Labs Reviewed  SARS CORONAVIRUS 2 (TAT 6-24 HRS)    EKG   Radiology No results found.  Procedures Procedures (including critical care time)  Medications Ordered in UC Medications - No data to display  Initial Impression / Assessment and Plan / UC Course  I have reviewed the triage vital signs and the nursing notes.  Pertinent labs & imaging results that were available during my care of the patient were reviewed by me and considered in my medical decision making (see chart for details).     Patient presents with symptoms likely from a viral upper respiratory infection. Differential includes bacterial pneumonia, sinusitis,  allergic rhinitis, COVID-19, flu, RSV. Do not suspect underlying cardiopulmonary process. Symptoms seem unlikely related to ACS, CHF or COPD exacerbations, pneumonia, pneumothorax. Patient is nontoxic appearing and not in need of emergent medical intervention.  Covid test pending.  Do not think chest imaging is necessary given no adventitious lung sounds on exam or signs of respiratory compromise.  Recommended symptom control with medications and supportive care.  Patient sent prescriptions.  Neck pain appears consistent with neck muscle strain.  Patient to avoid NSAIDs given that he takes blood thinners.  Patient may take Tylenol and advised to alternate ice and heat.  Return if symptoms fail to improve in 1-2 weeks or you develop shortness of breath, chest pain, severe headache. Patient states understanding and is agreeable.  Discharged with PCP followup.  Final Clinical Impressions(s) / UC Diagnoses   Final diagnoses:  Viral upper respiratory tract infection with cough  Strain of neck muscle, initial encounter     Discharge Instructions      It appears that you have a viral illness that should run its course and self resolve with symptomatic treatment.  I have prescribed you 2 medications to help alleviate symptoms and cough.  COVID test is pending.  Will call if it is positive.  Neck pain appears consistent with a neck muscle strain.  Please alternate ice and heat to affected area and take Tylenol as needed.  Please follow-up if symptoms persist or worsen with neck pain  as well.    ED Prescriptions     Medication Sig Dispense Auth. Provider   fluticasone (FLONASE) 50 MCG/ACT nasal spray Place 1 spray into both nostrils daily. 16 g Cayleen Benjamin, Hildred Alamin E, [place]   benzonatate (TESSALON) 100 MG capsule Take 1 capsule (100 mg total) by mouth every 8 (eight) hours as needed for cough. 21 capsule [place], Michele Rockers, [place]      PDMP not reviewed this encounter.   Teodora Medici, [place] 04/01/22  [phone]

## 2022-04-01 NOTE — ED Triage Notes (Signed)
Pt c/o head and neck congestion that is limiting ROM onset ~ 3-4 days ago. Also c/o constipation LBM today.

## 2022-04-01 NOTE — Discharge Instructions (Addendum)
It appears that you have a viral illness that should run its course and self resolve with symptomatic treatment.  I have prescribed you 2 medications to help alleviate symptoms and cough.  COVID test is pending.  Will call if it is positive.  Neck pain appears consistent with a neck muscle strain.  Please alternate ice and heat to affected area and take Tylenol as needed.  Please follow-up if symptoms persist or worsen with neck pain as well.

## 2022-04-02 LAB — SARS CORONAVIRUS 2 (TAT 6-24 HRS): SARS Coronavirus 2: NEGATIVE

## 2022-08-30 ENCOUNTER — Other Ambulatory Visit: Payer: Self-pay | Admitting: Internal Medicine

## 2022-09-01 LAB — URINE CULTURE
MICRO NUMBER:: 14905190
SPECIMEN QUALITY:: ADEQUATE

## 2022-09-05 ENCOUNTER — Encounter: Payer: Self-pay | Admitting: Podiatry

## 2022-09-05 ENCOUNTER — Ambulatory Visit (INDEPENDENT_AMBULATORY_CARE_PROVIDER_SITE_OTHER): Payer: Medicare PPO | Admitting: Podiatry

## 2022-09-05 DIAGNOSIS — M2041 Other hammer toe(s) (acquired), right foot: Secondary | ICD-10-CM | POA: Diagnosis not present

## 2022-09-05 DIAGNOSIS — M79674 Pain in right toe(s): Secondary | ICD-10-CM | POA: Diagnosis not present

## 2022-09-05 DIAGNOSIS — E1142 Type 2 diabetes mellitus with diabetic polyneuropathy: Secondary | ICD-10-CM | POA: Diagnosis not present

## 2022-09-05 DIAGNOSIS — B351 Tinea unguium: Secondary | ICD-10-CM

## 2022-09-05 DIAGNOSIS — M2042 Other hammer toe(s) (acquired), left foot: Secondary | ICD-10-CM | POA: Diagnosis not present

## 2022-09-05 DIAGNOSIS — M79675 Pain in left toe(s): Secondary | ICD-10-CM

## 2022-09-05 DIAGNOSIS — M2141 Flat foot [pes planus] (acquired), right foot: Secondary | ICD-10-CM | POA: Diagnosis not present

## 2022-09-05 NOTE — Addendum Note (Signed)
Addended by: Helane Gunther on: 09/05/2022 02:43 PM   Modules accepted: Level of Service

## 2022-09-05 NOTE — Progress Notes (Addendum)
This patient presents to the office with chief complaint of long thick nails and diabetic feet.  This patient  says there  is  numbness  and cold feet in his feet.  This patient says there are long thick painful nails.  These nails are painful walking and wearing shoes.  Patient has no history of infection or drainage from both feet.  Patient is unable to  self treat his own nails . This patient presents  to the office today for treatment of the  long nails and a foot evaluation due to history of  diabetes and requests diabetic shoes.  Patient has CKD and coagulation defect due to plavix.  General Appearance  Alert, conversant and in no acute stress.  Vascular  Dorsalis pedis  are palpable  bilaterally.  Posterior tibial pulses are not palpable. Capillary return is within normal limits  bilaterally. Temperature is within normal limits  bilaterally.  Neurologic  Senn-Weinstein monofilament wire test diminished   bilaterally. Muscle power within normal limits bilaterally.  Nails Thick disfigured discolored nails with subungual debris  from hallux to fifth toes bilaterally. No evidence of bacterial infection or drainage bilaterally.  Orthopedic  No limitations of motion of motion feet .  No crepitus or effusions noted. Pes planus.  Hammer toes 2-5  B/L.  Skin  normotropic skin with no porokeratosis noted bilaterally.  No signs of infections or ulcers noted.     Onychomycosis  Diabetic neuropathy.  IE  Debride nails x 10.  A diabetic foot exam was performed and there is no evidence of severe vascular or neurologic pathology.  Patient to make an appointment with [facility] for diabetic shoes.   RTC 3 months.   Helane Gunther DPM

## 2022-09-06 ENCOUNTER — Ambulatory Visit (INDEPENDENT_AMBULATORY_CARE_PROVIDER_SITE_OTHER): Payer: Medicare PPO

## 2022-09-06 DIAGNOSIS — M2141 Flat foot [pes planus] (acquired), right foot: Secondary | ICD-10-CM

## 2022-09-06 DIAGNOSIS — M2041 Other hammer toe(s) (acquired), right foot: Secondary | ICD-10-CM

## 2022-09-06 DIAGNOSIS — M2142 Flat foot [pes planus] (acquired), left foot: Secondary | ICD-10-CM

## 2022-09-06 DIAGNOSIS — E1142 Type 2 diabetes mellitus with diabetic polyneuropathy: Secondary | ICD-10-CM

## 2022-09-06 NOTE — Progress Notes (Signed)
Patient presents to the office today for diabetic shoe and insole measuring.  ABN signed.   Documentation of medical necessity will be sent to patient's treating diabetic doctor to verify and sign.   Patient's diabetic provider: Fleet Contras NPI: [id number]  Shoes and insoles will be ordered at that time and patient will be notified for an appointment for fitting when they arrive.   Patient shoe selection-   1st   Shoe choice:   [id number]  Shoe size ordered: 12W

## 2022-10-04 ENCOUNTER — Other Ambulatory Visit: Payer: Self-pay | Admitting: Internal Medicine

## 2022-10-05 LAB — BASIC METABOLIC PANEL WITH GFR
BUN/Creatinine Ratio: 15 (calc) (ref 6–22)
BUN: 23 mg/dL (ref 7–25)
CO2: 24 mmol/L (ref 20–32)
Calcium: 9.1 mg/dL (ref 8.6–10.3)
Chloride: 106 mmol/L (ref 98–110)
Creat: 1.56 mg/dL — ABNORMAL HIGH (ref 0.70–1.22)
Glucose, Bld: 195 mg/dL — ABNORMAL HIGH (ref 65–99)
Potassium: 4.4 mmol/L (ref 3.5–5.3)
Sodium: 140 mmol/L (ref 135–146)
eGFR: 44 mL/min/{1.73_m2} — ABNORMAL LOW (ref >=60)

## 2022-10-05 LAB — CBC
HCT: 36.3 % — ABNORMAL LOW (ref 38.5–50.0)
Hemoglobin: 11.5 g/dL — ABNORMAL LOW (ref 13.2–17.1)
MCH: 30.3 pg (ref 27.0–33.0)
MCHC: 31.7 g/dL — ABNORMAL LOW (ref 32.0–36.0)
MCV: 95.8 fL (ref 80.0–100.0)
MPV: 11.1 fL (ref 7.5–12.5)
Platelets: 164 10*3/uL (ref 140–400)
RBC: 3.79 10*6/uL — ABNORMAL LOW (ref 4.20–5.80)
RDW: 13.3 % (ref 11.0–15.0)
WBC: 4.1 10*3/uL (ref 3.8–10.8)

## 2022-10-12 ENCOUNTER — Telehealth: Payer: Self-pay | Admitting: Podiatry

## 2022-10-12 NOTE — Telephone Encounter (Signed)
Lmom for pt to call back to schedule picking up diabetic shoes   

## 2022-10-16 ENCOUNTER — Other Ambulatory Visit: Payer: Self-pay | Admitting: Internal Medicine

## 2022-10-17 ENCOUNTER — Ambulatory Visit (INDEPENDENT_AMBULATORY_CARE_PROVIDER_SITE_OTHER): Payer: Medicare PPO | Admitting: Podiatry

## 2022-10-17 DIAGNOSIS — M2142 Flat foot [pes planus] (acquired), left foot: Secondary | ICD-10-CM

## 2022-10-17 DIAGNOSIS — M2041 Other hammer toe(s) (acquired), right foot: Secondary | ICD-10-CM

## 2022-10-17 DIAGNOSIS — E1142 Type 2 diabetes mellitus with diabetic polyneuropathy: Secondary | ICD-10-CM

## 2022-10-17 DIAGNOSIS — M2042 Other hammer toe(s) (acquired), left foot: Secondary | ICD-10-CM

## 2022-10-17 DIAGNOSIS — M2141 Flat foot [pes planus] (acquired), right foot: Secondary | ICD-10-CM

## 2022-10-17 NOTE — Progress Notes (Signed)
Patient presents today to pick up diabetic shoes and insoles.  Patient was dispensed 1 pair of diabetic shoes and 3 pairs of foam casted diabetic insoles. He tried on the shoes with the insoles and the fit was not satisfactory.   Shoes too narrow  I recommended reorder with XW.   We will send back:   [id number] 12W   Reorder:  [id number]   Insoles were kept   to check fit with reordered shoes.  Patient will be contacted for a fitting appointment once the reordered shoes arrive in office.

## 2022-10-18 LAB — URINE CULTURE
MICRO NUMBER:: 15096784
SPECIMEN QUALITY:: ADEQUATE

## 2022-10-29 ENCOUNTER — Ambulatory Visit (INDEPENDENT_AMBULATORY_CARE_PROVIDER_SITE_OTHER): Payer: Medicare PPO | Admitting: Podiatry

## 2022-10-29 DIAGNOSIS — E1142 Type 2 diabetes mellitus with diabetic polyneuropathy: Secondary | ICD-10-CM

## 2022-10-29 DIAGNOSIS — M2041 Other hammer toe(s) (acquired), right foot: Secondary | ICD-10-CM

## 2022-10-29 DIAGNOSIS — M2141 Flat foot [pes planus] (acquired), right foot: Secondary | ICD-10-CM | POA: Diagnosis not present

## 2022-10-29 DIAGNOSIS — M2142 Flat foot [pes planus] (acquired), left foot: Secondary | ICD-10-CM

## 2022-10-29 DIAGNOSIS — M2042 Other hammer toe(s) (acquired), left foot: Secondary | ICD-10-CM

## 2022-10-29 NOTE — Progress Notes (Signed)
Patient presents today to pick up diabetic shoes and insoles.  Patient was dispensed 1 pair of diabetic shoes and 3 pairs of foam casted diabetic insoles. Fit was satisfactory. Instructions for break-in and wear was reviewed and a copy was given to the patient.   Re-appointment for regularly scheduled diabetic foot care visits or if they should experience any trouble with the shoes or insoles.  

## 2022-12-07 ENCOUNTER — Encounter: Payer: Self-pay | Admitting: Podiatry

## 2022-12-07 ENCOUNTER — Ambulatory Visit: Payer: Medicare PPO | Admitting: Podiatry

## 2022-12-07 DIAGNOSIS — M79674 Pain in right toe(s): Secondary | ICD-10-CM

## 2022-12-07 DIAGNOSIS — M79675 Pain in left toe(s): Secondary | ICD-10-CM

## 2022-12-07 DIAGNOSIS — B351 Tinea unguium: Secondary | ICD-10-CM | POA: Diagnosis not present

## 2022-12-07 DIAGNOSIS — E1142 Type 2 diabetes mellitus with diabetic polyneuropathy: Secondary | ICD-10-CM | POA: Diagnosis not present

## 2022-12-07 NOTE — Progress Notes (Signed)
This patient returns to my office for at risk foot care.  This patient requires this care by a professional since this patient will be at risk due to having coagulation defect  and diabetic neuropathy.This patient is unable to cut nails himself since the patient cannot reach his nails.These nails are painful walking and wearing shoes.  This patient presents for at risk   foot care today.  General Appearance  Alert, conversant and in no acute stress.  Vascular  Dorsalis pedis and posterior tibial  pulses are palpable  bilaterally.  Capillary return is within normal limits  bilaterally. Temperature is within normal limits  bilaterally.  Neurologic  Senn-Weinstein monofilament wire test diminished  bilaterally. Muscle power within normal limits bilaterally.  Nails Thick disfigured discolored nails with subungual debris  from hallux to fifth toes bilaterally. No evidence of bacterial infection or drainage bilaterally.  Orthopedic  No limitations of motion  feet .  No crepitus or effusions noted.  No bony pathology or digital deformities noted.  Skin  normotropic skin with no porokeratosis noted bilaterally.  No signs of infections or ulcers noted.     Onychomycosis  Pain in right toes  Pain in left toes  Consent was obtained for treatment procedures.   Mechanical debridement of nails 1-5  bilaterally performed with a nail nipper.  Filed with dremel without incident.    Return office visit    3 months                  Told patient to return for periodic foot care and evaluation due to potential at risk complications.   Helane Gunther DPM

## 2023-01-06 ENCOUNTER — Encounter (HOSPITAL_COMMUNITY): Payer: Self-pay | Admitting: *Deleted

## 2023-01-06 ENCOUNTER — Emergency Department (HOSPITAL_COMMUNITY): Payer: Medicare PPO

## 2023-01-06 ENCOUNTER — Other Ambulatory Visit: Payer: Self-pay

## 2023-01-06 ENCOUNTER — Emergency Department (HOSPITAL_COMMUNITY)
Admission: EM | Admit: 2023-01-06 | Discharge: 2023-01-07 | Disposition: A | Discharge status: Home / Self Care | Payer: Medicare PPO | Attending: Emergency Medicine | Admitting: Emergency Medicine

## 2023-01-06 ENCOUNTER — Encounter: Payer: Self-pay | Admitting: *Deleted

## 2023-01-06 ENCOUNTER — Ambulatory Visit
Admission: EM | Admit: 2023-01-06 | Discharge: 2023-01-06 | Disposition: A | Discharge status: Home / Self Care | Payer: Medicare PPO | Attending: Internal Medicine | Admitting: Internal Medicine

## 2023-01-06 DIAGNOSIS — Z8546 Personal history of malignant neoplasm of prostate: Secondary | ICD-10-CM | POA: Diagnosis not present

## 2023-01-06 DIAGNOSIS — N183 Chronic kidney disease, stage 3 unspecified: Secondary | ICD-10-CM | POA: Insufficient documentation

## 2023-01-06 DIAGNOSIS — Z7902 Long term (current) use of antithrombotics/antiplatelets: Secondary | ICD-10-CM | POA: Insufficient documentation

## 2023-01-06 DIAGNOSIS — Z7982 Long term (current) use of aspirin: Secondary | ICD-10-CM | POA: Diagnosis not present

## 2023-01-06 DIAGNOSIS — E1122 Type 2 diabetes mellitus with diabetic chronic kidney disease: Secondary | ICD-10-CM | POA: Insufficient documentation

## 2023-01-06 DIAGNOSIS — Z794 Long term (current) use of insulin: Secondary | ICD-10-CM | POA: Insufficient documentation

## 2023-01-06 DIAGNOSIS — Z79899 Other long term (current) drug therapy: Secondary | ICD-10-CM | POA: Diagnosis not present

## 2023-01-06 DIAGNOSIS — R42 Dizziness and giddiness: Secondary | ICD-10-CM

## 2023-01-06 DIAGNOSIS — I129 Hypertensive chronic kidney disease with stage 1 through stage 4 chronic kidney disease, or unspecified chronic kidney disease: Secondary | ICD-10-CM | POA: Insufficient documentation

## 2023-01-06 DIAGNOSIS — Z20822 Contact with and (suspected) exposure to covid-19: Secondary | ICD-10-CM | POA: Diagnosis not present

## 2023-01-06 DIAGNOSIS — R0602 Shortness of breath: Secondary | ICD-10-CM | POA: Insufficient documentation

## 2023-01-06 LAB — CBC
HCT: 36.3 % — ABNORMAL LOW (ref 39.0–52.0)
Hemoglobin: 11.4 g/dL — ABNORMAL LOW (ref 13.0–17.0)
MCH: 30.6 pg (ref 26.0–34.0)
MCHC: 31.4 g/dL (ref 30.0–36.0)
MCV: 97.6 fL (ref 80.0–100.0)
Platelets: 186 10*3/uL (ref 150–400)
RBC: 3.72 MIL/uL — ABNORMAL LOW (ref 4.22–5.81)
RDW: 13.5 % (ref 11.5–15.5)
WBC: 4.5 10*3/uL (ref 4.0–10.5)
nRBC: 0 % (ref 0.0–0.2)

## 2023-01-06 LAB — MAGNESIUM: Magnesium: 2.1 mg/dL (ref 1.7–2.4)

## 2023-01-06 LAB — BASIC METABOLIC PANEL
Anion gap: 8 (ref 5–15)
BUN: 18 mg/dL (ref 8–23)
CO2: 25 mmol/L (ref 22–32)
Calcium: 9.3 mg/dL (ref 8.9–10.3)
Chloride: 107 mmol/L (ref 98–111)
Creatinine, Ser: 1.49 mg/dL — ABNORMAL HIGH (ref 0.61–1.24)
GFR, Estimated: 46 mL/min — ABNORMAL LOW (ref >60)
Glucose, Bld: 156 mg/dL — ABNORMAL HIGH (ref 70–99)
Potassium: 4 mmol/L (ref 3.5–5.1)
Sodium: 140 mmol/L (ref 135–145)

## 2023-01-06 LAB — TROPONIN I (HIGH SENSITIVITY)
Troponin I (High Sensitivity): 17 ng/L (ref <18)
Troponin I (High Sensitivity): 18 ng/L — ABNORMAL HIGH (ref <18)

## 2023-01-06 MED ORDER — LACTATED RINGERS IV BOLUS
500.0000 mL | Freq: Once | INTRAVENOUS | Status: AC
  Administered 2023-01-07: 500 mL via INTRAVENOUS

## 2023-01-06 MED ORDER — MECLIZINE HCL 25 MG PO TABS
25.0000 mg | ORAL_TABLET | Freq: Once | ORAL | Status: AC
  Administered 2023-01-06: 25 mg via ORAL
  Filled 2023-01-06: qty 1

## 2023-01-06 NOTE — ED Triage Notes (Signed)
Pt reports feeling "dizzy" and "spinning", esp with movement over the last 2 days. He has been feeling lightheaded with standing over the last 2 weeks (has seen PCP for this) but the vertigo started 2 days ago. He has an appt with ENT in October

## 2023-01-06 NOTE — Discharge Instructions (Signed)
Please go straight to the emergency department for further evaluation and management.

## 2023-01-06 NOTE — ED Provider Notes (Signed)
[facility] EMERGENCY DEPARTMENT AT [facility] Provider Note   CSN: [id number] Arrival date & time: 01/06/23  1642     History {Add pertinent medical, surgical, social history, OB history to HPI:1} Chief Complaint  Patient presents with   Dizziness    Nathan Proctor. is a 82 y.o. male with PMH as listed below who presents with dizziness that has worsened over the past few days. Patient reports that he has been feeling dizziness and shortness of breath over the past few weeks. He was evaluated by PCP who placed a referral for ENT who he is supposed to see on October 9. He also had an MRI that was normal per wife's report. Reports that over the past 2 days the dizziness has worsened and typically occurs when he stands up. He is currently sitting in a wheelchair giving difficulty ambulating due to dizziness. He denies chest pain, headache, nausea, vomiting. Reports that he does not currently smoke cigarettes. Denies any falls or head injuries.  States that he has fluid behind one of his ears and has appt with ENT. Has continued to have vertigo symptoms that are worse when he turns his head or moves around.  Endorses "head cold" with nasal congestion, rhinorrhea, cough and coughing up yellow mucous. Denies f/c chest pain, asymmetric numbness/tingling, asymmetric weakness, nausea/vomiting/diarrhea, urinary symptoms, abdominal pain, leg swelling, h/o DVT /PE.   Past Medical History:  Diagnosis Date   Anemia of chronic disease    Ankle fracture 02/15/2016   Cancer (HCC)    Prostate   Chronic constipation    Chronic kidney disease    stage III   Diabetes mellitus without complication (HCC)    type II    Diabetic peripheral neuropathy (HCC)    Failure to thrive (0-17)    Fracture of left lower leg    Gout    Hyperlipidemia    Hypertension    Morbid obesity (HCC)    Unstable gait        Home Medications Prior to Admission medications   Medication Sig Start Date End  Date Taking? Authorizing Provider  acetaminophen (TYLENOL) 325 MG tablet Take 1-2 tablets (325-650 mg total) by mouth every 4 (four) hours as needed for mild pain. 10/14/19   Love, Evlyn Kanner, PA-C  allopurinol (ZYLOPRIM) 100 MG tablet Take 100 mg by mouth daily.  01/25/16   [provider]  amLODipine (NORVASC) 2.5 MG tablet 10 mg daily.  11/12/19   [provider]  aspirin EC 81 MG EC tablet Take 1 tablet (81 mg total) by mouth daily. 10/08/19   Danford, Earl Lites, MD  atorvastatin (LIPITOR) 80 MG tablet Take 1 tablet (80 mg total) by mouth daily. 10/14/19   Love, Evlyn Kanner, PA-C  benzonatate (TESSALON) 100 MG capsule Take 1 capsule (100 mg total) by mouth every 8 (eight) hours as needed for cough. 04/01/22   Gustavus Bryant, FNP  carvedilol (COREG) 25 MG tablet TAKE 1 TABLET BY MOUTH TWICE DAILY WITH A MEAL 09/05/20   Graciella Freer, PA-C  clopidogrel (PLAVIX) 75 MG tablet Take 1 tablet (75 mg total) by mouth daily. 10/14/19   Love, Evlyn Kanner, PA-C  diclofenac Sodium (VOLTAREN) 1 % GEL  11/26/19   [provider]  fluticasone (FLONASE) 50 MCG/ACT nasal spray Place 1 spray into both nostrils daily. 04/01/22   Gustavus Bryant, FNP  gabapentin (NEURONTIN) 300 MG capsule Take 300 mg by mouth 3 (three) times daily. 02/05/22  [provider]  insulin NPH-regular Human (70-30) 100 UNIT/ML injection Inject 5 Units into the skin daily. 10/14/19   Love, Evlyn Kanner, PA-C  JARDIANCE 10 MG TABS tablet Take 10 mg by mouth daily. 03/31/22   [provider]  losartan (COZAAR) 25 MG tablet Take 1 tablet (25 mg total) by mouth daily. 12/23/19 03/22/20  Graciella Freer, PA-C  Vitamin D, Ergocalciferol, (DRISDOL) 50000 units CAPS capsule Take 50,000 Units by mouth every 7 (seven) days.     [provider]      Allergies    Patient has no known allergies.    Review of Systems   Review of Systems A 10 point review of systems was performed and is negative  unless otherwise reported in HPI.  Physical Exam Updated Vital Signs BP (!) 153/85 (BP Location: Right Arm)   Pulse (!) 102   Temp 97.9 F (36.6 C) (Oral)   Resp (!) 23   Ht 5\' 10"  (1.778 m)   Wt 134.7 kg   SpO2 97%   BMI 42.61 kg/m  Physical Exam General: Normal appearing {Desc; male/male:11659}, lying in bed.  HEENT: PERRLA, Sclera anicteric, MMM, trachea midline.  Cardiology: RRR, no murmurs/rubs/gallops. BL radial and DP pulses equal bilaterally.  Resp: Normal respiratory rate and effort. CTAB, no wheezes, rhonchi, crackles.  Abd: Soft, non-tender, non-distended. No rebound tenderness or guarding.  GU: Deferred. MSK: No peripheral edema or signs of trauma. Extremities without deformity or TTP. No cyanosis or clubbing. Skin: warm, dry. No rashes or lesions. Back: No CVA tenderness Neuro: A&Ox4, CNs II-XII grossly intact. MAEs. Sensation grossly intact.  Psych: Normal mood and affect.   ED Results / Procedures / Treatments   Labs (all labs ordered are listed, but only abnormal results are displayed) Labs Reviewed  BASIC METABOLIC PANEL - Abnormal; Notable for the following components:      Result Value   Glucose, Bld 156 (*)    Creatinine, Ser 1.49 (*)    GFR, Estimated 46 (*)    All other components within normal limits  CBC - Abnormal; Notable for the following components:   RBC 3.72 (*)    Hemoglobin 11.4 (*)    HCT 36.3 (*)    All other components within normal limits  TROPONIN I (HIGH SENSITIVITY) - Abnormal; Notable for the following components:   Troponin I (High Sensitivity) 18 (*)    All other components within normal limits  SARS CORONAVIRUS 2 BY RT PCR  MAGNESIUM  URINALYSIS, ROUTINE W REFLEX MICROSCOPIC  D-DIMER, QUANTITATIVE  TROPONIN I (HIGH SENSITIVITY)    EKG None  Radiology DG Chest 2 View  Result Date: 01/06/2023 CLINICAL DATA:  Dizziness, peripheral edema EXAM: CHEST - 2 VIEW COMPARISON:  04/04/2016 FINDINGS: There is stable elevation of  the right hemidiaphragm. Mild bibasilar atelectasis. Lungs are otherwise clear. No pneumothorax or pleural effusion. Cardiac size within normal limits. Pulmonary vascularity is normal. No acute bone abnormality. IMPRESSION: 1. Mild bibasilar atelectasis. Electronically Signed   By: Helyn Numbers M.D.   On: 01/06/2023 18:18    Procedures Procedures  {Document cardiac monitor, telemetry assessment procedure when appropriate:1}  Medications Ordered in ED Medications  meclizine (ANTIVERT) tablet 25 mg (25 mg Oral Given 01/06/23 2309)    ED Course/ Medical Decision Making/ A&P                          Medical Decision Making Amount and/or Complexity of Data Reviewed  Labs: ordered. Decision-making details documented in ED Course. Radiology:  Decision-making details documented in ED Course.    This patient presents to the ED for concern of ***, this involves an extensive number of treatment options, and is a complaint that carries with it a high risk of complications and morbidity.  I considered the following differential and admission for this acute, potentially life threatening condition.   MDM:    If peripheral vertigo, consider BPPV, meniere's syndrome, labyrinthitis, vestibular neuritis, ear foreign body. No reported head trauma. If central, consider posterior stroke, brain tumor, vertebral artery dissection, basilar migraine, vertebrobasilar insufficiency, demyelinating disorder, infections such as neurosyphilis or tuberculosis.  Other causes include anemia, hyperviscosity syndrome, toxic (alcohol, aminoglycosides), metabolic such as thyroid disease or hypoglycemia. Patient has no nystagmus on exam, cannot complete a HINTS exam.    DDX for dyspnea includes but is not limited to: Cardiac- EKG w/o signs of ischemia or arrhythmia and troponin is flat, very low c/f ACS. No significant LE edema or crackles/pulm edema to indicate CHF. CXR doesn't show enlarged heart to indicate CHF or cardiac  tamponade, normal BP.  Respiratory - No wheezing to indicate COPD/RAD. CXR doesn't show PNA, pulm edema, pleural effusion, or PTX. Will obtain dimer to r/o PE. Consider viral URI such as covid as well or bronchitis given patient's report of head cold symptoms as well.  Other - No worsening anemia, Hgb at baseline of ~11.     Clinical Course as of 01/06/23 2319  Wynelle Link Jan 06, 2023  2233 DG Chest 2 View There is stable elevation of the right hemidiaphragm. Mild bibasilar atelectasis. Lungs are otherwise clear. No pneumothorax or pleural effusion. Cardiac size within normal limits. Pulmonary vascularity is normal. No acute bone abnormality.  IMPRESSION: 1. Mild bibasilar atelectasis.   [HN]  2233 Troponin I (High Sensitivity)(!): 18 Flat, 17-18 [HN]    Clinical Course User Index [HN] Loetta Rough, MD    Labs: I Ordered, and personally interpreted labs.  The pertinent results include:  those listed above  Imaging Studies ordered: I ordered imaging studies including CXR I independently visualized and interpreted imaging. I agree with the radiologist interpretation  Additional history obtained from chart review, wife at bedside.    Reevaluation: After the interventions noted above, I reevaluated the patient and found that they have :{resolved/improved/worsened:23923::"improved"}  Social Determinants of Health: Lives independently with wife  Disposition:  ***  Co morbidities that complicate the patient evaluation  Past Medical History:  Diagnosis Date   Anemia of chronic disease    Ankle fracture 02/15/2016   Cancer (HCC)    Prostate   Chronic constipation    Chronic kidney disease    stage III   Diabetes mellitus without complication (HCC)    type II    Diabetic peripheral neuropathy (HCC)    Failure to thrive (0-17)    Fracture of left lower leg    Gout    Hyperlipidemia    Hypertension    Morbid obesity (HCC)    Unstable gait      Medicines Meds ordered  this encounter  Medications   meclizine (ANTIVERT) tablet 25 mg    I have reviewed the patients home medicines and have made adjustments as needed  Problem List / ED Course: Problem List Items Addressed This Visit   None        {Document critical care time when appropriate:1} {Document review of labs and clinical decision tools ie heart score, Chads2Vasc2 etc:1}  {Document  your independent review of radiology images, and any outside records:1} {Document your discussion with family members, caretakers, and with consultants:1} {Document social determinants of health affecting pt's care:1} {Document your decision making why or why not admission, treatments were needed:1}  This note was created using dictation software, which may contain spelling or grammatical errors.

## 2023-01-06 NOTE — ED Notes (Signed)
Patient is being discharged from the Urgent Care and sent to the Emergency Department via pov . Per Rolly Salter, NP, patient is in need of higher level of care due to dizziness. Patient is aware and verbalizes understanding of plan of care.  Vitals:   01/06/23 1459  BP: (!) 149/89  Pulse: 93  Resp: 20  SpO2: 95%

## 2023-01-06 NOTE — ED Provider Notes (Signed)
[facility]    CSN: [id number] Arrival date & time: 01/06/23  1451      History   Chief Complaint Chief Complaint  Patient presents with   Dizziness    HPI Nathan Proctor. is a 82 y.o. male.   Patient presents with dizziness that has worsened over the past few days.  Patient reports that he has been feeling dizziness and shortness of breath over the past few weeks.  He was evaluated by PCP who placed a referral for ENT who he is supposed to see on October 9.  He also had an MRI that was normal per wife's report.  Reports that over the past 2 days the dizziness has worsened and typically occurs when he stands up.  He is currently sitting in a wheelchair giving difficulty ambulating due to dizziness.  He denies chest pain, headache, nausea, vomiting.  Reports that he does not currently smoke cigarettes.  Denies any falls or head injuries.   Dizziness   Past Medical History:  Diagnosis Date   Anemia of chronic disease    Ankle fracture 02/15/2016   Cancer (HCC)    Prostate   Chronic constipation    Chronic kidney disease    stage III   Diabetes mellitus without complication (HCC)    type II    Diabetic peripheral neuropathy (HCC)    Failure to thrive (0-17)    Fracture of left lower leg    Gout    Hyperlipidemia    Hypertension    Morbid obesity (HCC)    Unstable gait     Patient Active Problem List   Diagnosis Date Noted   Pain due to onychomycosis of toenails of both feet 09/05/2022   Acute on chronic renal failure (HCC) 10/14/2019   Acute ischemic left MCA stroke (HCC) 10/07/2019   Morbid obesity (HCC)    NSVT (nonsustained ventricular tachycardia) (HCC)    Diabetic peripheral neuropathy (HCC)    Acute ischemic stroke (HCC) 10/04/2019   Hypoglycemia due to insulin 10/04/2019   CKD (chronic kidney disease), stage III (HCC) 10/04/2019   Dehydration 10/04/2019   Dyslipidemia 10/04/2019   Stroke (HCC) 10/04/2019   Ankle fracture, left  02/29/2016   Closed left ankle fracture 02/29/2016   Gout 02/20/2016   Essential hypertension 02/20/2016    Past Surgical History:  Procedure Laterality Date   ORIF ANKLE FRACTURE Left 02/29/2016   Procedure: OPEN REDUCTION INTERNAL FIXATION (ORIF) ANKLE FRACTURE;  Surgeon: Yolonda Kida, MD;  Location: [facility];  Service: Orthopedics;  Laterality: Left;   PROSTATE SURGERY         Home Medications    Prior to Admission medications   Medication Sig Start Date End Date Taking? Authorizing Provider  acetaminophen (TYLENOL) 325 MG tablet Take 1-2 tablets (325-650 mg total) by mouth every 4 (four) hours as needed for mild pain. 10/14/19   Love, Evlyn Kanner, PA-C  allopurinol (ZYLOPRIM) 100 MG tablet Take 100 mg by mouth daily.  01/25/16   [provider]  amLODipine (NORVASC) 2.5 MG tablet 10 mg daily.  11/12/19   [provider]  aspirin EC 81 MG EC tablet Take 1 tablet (81 mg total) by mouth daily. 10/08/19   Danford, Earl Lites, MD  atorvastatin (LIPITOR) 80 MG tablet Take 1 tablet (80 mg total) by mouth daily. 10/14/19   Love, Evlyn Kanner, PA-C  benzonatate (TESSALON) 100 MG capsule Take 1 capsule (100 mg total) by mouth every 8 (eight) hours as  needed for cough. 04/01/22   Gustavus Bryant, FNP  carvedilol (COREG) 25 MG tablet TAKE 1 TABLET BY MOUTH TWICE DAILY WITH A MEAL 09/05/20   Graciella Freer, PA-C  clopidogrel (PLAVIX) 75 MG tablet Take 1 tablet (75 mg total) by mouth daily. 10/14/19   Love, Evlyn Kanner, PA-C  diclofenac Sodium (VOLTAREN) 1 % GEL  11/26/19   [provider]  fluticasone (FLONASE) 50 MCG/ACT nasal spray Place 1 spray into both nostrils daily. 04/01/22   Gustavus Bryant, FNP  gabapentin (NEURONTIN) 300 MG capsule Take 300 mg by mouth 3 (three) times daily. 02/05/22   [provider]  insulin NPH-regular Human (70-30) 100 UNIT/ML injection Inject 5 Units into the skin daily. 10/14/19   Love, Evlyn Kanner, PA-C  JARDIANCE 10 MG TABS tablet  Take 10 mg by mouth daily. 03/31/22   [provider]  losartan (COZAAR) 25 MG tablet Take 1 tablet (25 mg total) by mouth daily. 12/23/19 03/22/20  Graciella Freer, PA-C  Vitamin D, Ergocalciferol, (DRISDOL) 50000 units CAPS capsule Take 50,000 Units by mouth every 7 (seven) days.     [provider]    Family History Family History  Problem Relation Age of Onset   Heart Problems Father    Heart Problems Sister     Social History Social History   Tobacco Use   Smoking status: Former    Current packs/day: 0.00    Types: Cigarettes    Quit date: 1979    Years since quitting: 45.7   Smokeless tobacco: Never  Vaping Use   Vaping status: Former  Substance Use Topics   Alcohol use: Not Currently    Comment: Occasionally   Drug use: No     Allergies   Patient has no known allergies.   Review of Systems Review of Systems Per HPI  Physical Exam Triage Vital Signs ED Triage Vitals  Encounter Vitals Group     BP 01/06/23 1459 (!) 149/89     Systolic BP Percentile --      Diastolic BP Percentile --      Pulse Rate 01/06/23 1459 93     Resp 01/06/23 1459 20     Temp --      Temp Source 01/06/23 1459 Oral     SpO2 01/06/23 1459 95 %     Weight --      Height --      Head Circumference --      Peak Flow --      Pain Score 01/06/23 1500 0     Pain Loc --      Pain Education --      Exclude from Growth Chart --    No data found.  Updated Vital Signs BP (!) 149/89 (BP Location: Right Arm)   Pulse 93   Resp 20   SpO2 95%   Visual Acuity Right Eye Distance:   Left Eye Distance:   Bilateral Distance:    Right Eye Near:   Left Eye Near:    Bilateral Near:     Physical Exam Constitutional:      General: He is not in acute distress.    Appearance: Normal appearance. He is not toxic-appearing or diaphoretic.  HENT:     Head: Normocephalic and atraumatic.     Right Ear: Ear canal normal. A middle ear effusion is present. Tympanic  membrane is not perforated, erythematous or bulging.     Left Ear: Ear canal normal.  A middle ear effusion is present. Tympanic membrane is not perforated, erythematous or bulging.  Eyes:     Extraocular Movements: Extraocular movements intact.     Conjunctiva/sclera: Conjunctivae normal.     Pupils: Pupils are equal, round, and reactive to light.  Cardiovascular:     Rate and Rhythm: Normal rate and regular rhythm.     Pulses: Normal pulses.     Heart sounds: Normal heart sounds.  Pulmonary:     Effort: Pulmonary effort is normal. No respiratory distress.     Breath sounds: Normal breath sounds.  Neurological:     General: No focal deficit present.     Mental Status: He is alert and oriented to person, place, and time. Mental status is at baseline.     Cranial Nerves: Cranial nerves 2-12 are intact.     Sensory: Sensation is intact.     Motor: Motor function is intact.     Coordination: Coordination is intact.     Gait: Gait is intact.  Psychiatric:        Mood and Affect: Mood normal.        Behavior: Behavior normal.        Thought Content: Thought content normal.        Judgment: Judgment normal.      UC Treatments / Results  Labs (all labs ordered are listed, but only abnormal results are displayed) Labs Reviewed - No data to display  EKG   Radiology No results found.  Procedures Procedures (including critical care time)  Medications Ordered in UC Medications - No data to display  Initial Impression / Assessment and Plan / UC Course  I have reviewed the triage vital signs and the nursing notes.  Pertinent labs & imaging results that were available during my care of the patient were reviewed by me and considered in my medical decision making (see chart for details).     EKG completed to rule out cardiac etiology related to increase in dizziness.  It did show change in T wave which was different from previous EKGs.  Therefore, given dizziness has become more  severe with associated shortness of breath and patient is currently sitting in a wheelchair given significance of dizziness, recommended that patient go to the emergency department today to rule out more worrisome etiologies as opposed to simply vertigo or fluid behind TMs.  Patient and wife were agreeable with this plan.  Vital signs and neuroexam stable at discharge.  Agree with patient's wife transporting him to the ER. Final Clinical Impressions(s) / UC Diagnoses   Final diagnoses:  Dizziness and giddiness     Discharge Instructions      Please go straight to the emergency department for further evaluation and management.     ED Prescriptions   None    PDMP not reviewed this encounter.   Gustavus Bryant, [place] 01/06/23 1600

## 2023-01-06 NOTE — ED Triage Notes (Signed)
The pt is c/o dizziness  for 2-3 weeks  but the dizziness is getting worse  and he gets a little short of breath  when he walks

## 2023-01-06 NOTE — ED Notes (Signed)
Patient aware of need for urine sample.  Provided with urinal and will attempt to obtain at this time

## 2023-01-06 NOTE — ED Provider Notes (Signed)
Plan to f/u on labs, recheck, ambulate and possible d/c home   Zadie Rhine, MD 01/06/23 2343

## 2023-01-07 LAB — URINALYSIS, ROUTINE W REFLEX MICROSCOPIC
Bilirubin Urine: NEGATIVE
Glucose, UA: NEGATIVE mg/dL
Hgb urine dipstick: NEGATIVE
Ketones, ur: NEGATIVE mg/dL
Leukocytes,Ua: NEGATIVE
Nitrite: NEGATIVE
Protein, ur: NEGATIVE mg/dL
Specific Gravity, Urine: 1.017 (ref 1.005–1.030)
pH: 5 (ref 5.0–8.0)

## 2023-01-07 LAB — D-DIMER, QUANTITATIVE: D-Dimer, Quant: 0.67 ug{FEU}/mL — ABNORMAL HIGH (ref 0.00–0.50)

## 2023-01-07 LAB — SARS CORONAVIRUS 2 BY RT PCR: SARS Coronavirus 2 by RT PCR: NEGATIVE

## 2023-01-07 MED ORDER — MECLIZINE HCL 25 MG PO TABS: 25.0000 mg | ORAL_TABLET | Freq: Two times a day (BID) | ORAL | 0 refills | Status: DC | PRN

## 2023-01-07 NOTE — ED Provider Notes (Signed)
I assumed care in signout to follow-up on labs.  No UTI.  COVID-negative.  Age-adjusted D-dimer is within normal limits.  Patient is awake and alert in no acute distress.  He reports dizziness has resolved.  There is no hypoxia, no tachycardia.   EKG Interpretation Date/Time:  Monday January 07 2023 00:58:24 EDT Ventricular Rate:  86 PR Interval:  194 QRS Duration:  90 QT Interval:  397 QTC Calculation: 475 R Axis:   31  Text Interpretation: Sinus rhythm Probable left atrial enlargement Borderline repolarization abnormality Interpretation limited secondary to artifact Confirmed by Zadie Rhine ([id number]) on 01/07/2023 1:00:53 AM         Patient is requesting discharge.  It appears his dizziness has been ongoing for quite some time.  Both patient and his wife report he has had a negative MRI brain in the past several weeks.  He has otolaryngology follow-up next month for his dizziness. He did have some improvement with Antivert, and request this at discharge.  Patient has been able to walk. Low suspicion for occult CVA.  He will be discharged home   Zadie Rhine, MD 01/07/23 0200

## 2023-01-07 NOTE — Discharge Instructions (Signed)
Your exam shows you have had an episode of vertigo, which causes a false sense of movement such as a spinning feeling or walls that seem to move.  Most vertigo is caused by a (usually temporary) problem in the inner ear. Rarely, the back part of the brain can cause vertigo (some mini-strokes/strokes), but it appears to be a low risk cause for you at this time. It is important to follow-up with your doctor however, to see if you need further testing.   Do not drive or participate in potentially dangerous activities requiring balance unless off meds (not drowsy) and the vertigo has resolved. Most of the time benign vertigo is much better after a few days. However, mild unsteadiness may last for up to 3 months in some patients. An MRI scan or other special tests to evaluate your hearing and balance may be needed if the vertigo does not improve or returns in the future.   RETURN IMMEDIATELY IF YOU HAVE ANY OF THE FOLLOWING (call 911): Increasing vertigo, earache, ear drainage, or loss of hearing.  Severe headache, blurred or double vision, or trouble walking.  Fainting or poorly responsive, extreme weakness, chest pain, or palpitations.  Fever, persistent vomiting, or dehydration.  Numbness, tingling, incoordination, or weakness of the limbs.  Change in speech, vision, swallowing, understanding, or other concerns.  

## 2023-03-06 ENCOUNTER — Encounter: Payer: Self-pay | Admitting: Podiatry

## 2023-03-06 ENCOUNTER — Ambulatory Visit: Payer: Medicare PPO | Admitting: Podiatry

## 2023-03-06 DIAGNOSIS — M79675 Pain in left toe(s): Secondary | ICD-10-CM

## 2023-03-06 DIAGNOSIS — M79674 Pain in right toe(s): Secondary | ICD-10-CM | POA: Diagnosis not present

## 2023-03-06 DIAGNOSIS — E1142 Type 2 diabetes mellitus with diabetic polyneuropathy: Secondary | ICD-10-CM | POA: Diagnosis not present

## 2023-03-06 DIAGNOSIS — B351 Tinea unguium: Secondary | ICD-10-CM | POA: Diagnosis not present

## 2023-03-06 NOTE — Progress Notes (Signed)
This patient returns to my office for at risk foot care.  This patient requires this care by a professional since this patient will be at risk due to having coagulation defect  and diabetic neuropathy.This patient is unable to cut nails himself since the patient cannot reach his nails.These nails are painful walking and wearing shoes.  This patient presents for at risk   foot care today.  General Appearance  Alert, conversant and in no acute stress.  Vascular  Dorsalis pedis and posterior tibial  pulses are palpable  bilaterally.  Capillary return is within normal limits  bilaterally. Temperature is within normal limits  bilaterally.  Neurologic  Senn-Weinstein monofilament wire test diminished  bilaterally. Muscle power within normal limits bilaterally.  Nails Thick disfigured discolored nails with subungual debris  from hallux to fifth toes bilaterally. No evidence of bacterial infection or drainage bilaterally.  Orthopedic  No limitations of motion  feet .  No crepitus or effusions noted.  No bony pathology or digital deformities noted.  Skin  normotropic skin with no porokeratosis noted bilaterally.  No signs of infections or ulcers noted.     Onychomycosis  Pain in right toes  Pain in left toes  Consent was obtained for treatment procedures.   Mechanical debridement of nails 1-5  bilaterally performed with a nail nipper.  Filed with dremel without incident.    Return office visit    3 months                  Told patient to return for periodic foot care and evaluation due to potential at risk complications.   Helane Gunther DPM

## 2023-06-05 ENCOUNTER — Inpatient Hospital Stay (HOSPITAL_COMMUNITY): Payer: No Typology Code available for payment source

## 2023-06-05 ENCOUNTER — Emergency Department (HOSPITAL_COMMUNITY): Payer: No Typology Code available for payment source

## 2023-06-05 ENCOUNTER — Encounter (HOSPITAL_COMMUNITY): Admission: EM | Disposition: E | Payer: Self-pay | Source: Home / Self Care | Attending: Neurology

## 2023-06-05 ENCOUNTER — Emergency Department (HOSPITAL_COMMUNITY): Payer: No Typology Code available for payment source | Admitting: Registered Nurse

## 2023-06-05 ENCOUNTER — Other Ambulatory Visit (HOSPITAL_COMMUNITY): Payer: Self-pay | Admitting: Neuroradiology

## 2023-06-05 ENCOUNTER — Inpatient Hospital Stay (HOSPITAL_COMMUNITY)
Admission: EM | Admit: 2023-06-05 | Discharge: 2023-07-30 | DRG: 023 | Disposition: E | Payer: No Typology Code available for payment source | Attending: Pulmonary Disease | Admitting: Pulmonary Disease

## 2023-06-05 DIAGNOSIS — K567 Ileus, unspecified: Secondary | ICD-10-CM | POA: Diagnosis not present

## 2023-06-05 DIAGNOSIS — N179 Acute kidney failure, unspecified: Secondary | ICD-10-CM | POA: Diagnosis not present

## 2023-06-05 DIAGNOSIS — R1310 Dysphagia, unspecified: Secondary | ICD-10-CM | POA: Diagnosis not present

## 2023-06-05 DIAGNOSIS — J150 Pneumonia due to Klebsiella pneumoniae: Secondary | ICD-10-CM | POA: Diagnosis not present

## 2023-06-05 DIAGNOSIS — E785 Hyperlipidemia, unspecified: Secondary | ICD-10-CM | POA: Diagnosis not present

## 2023-06-05 DIAGNOSIS — J690 Pneumonitis due to inhalation of food and vomit: Secondary | ICD-10-CM | POA: Diagnosis not present

## 2023-06-05 DIAGNOSIS — I5021 Acute systolic (congestive) heart failure: Secondary | ICD-10-CM | POA: Diagnosis not present

## 2023-06-05 DIAGNOSIS — Z8673 Personal history of transient ischemic attack (TIA), and cerebral infarction without residual deficits: Secondary | ICD-10-CM

## 2023-06-05 DIAGNOSIS — G9349 Other encephalopathy: Secondary | ICD-10-CM | POA: Diagnosis present

## 2023-06-05 DIAGNOSIS — Z7902 Long term (current) use of antithrombotics/antiplatelets: Secondary | ICD-10-CM

## 2023-06-05 DIAGNOSIS — K920 Hematemesis: Secondary | ICD-10-CM | POA: Diagnosis not present

## 2023-06-05 DIAGNOSIS — I255 Ischemic cardiomyopathy: Secondary | ICD-10-CM | POA: Diagnosis present

## 2023-06-05 DIAGNOSIS — I6522 Occlusion and stenosis of left carotid artery: Secondary | ICD-10-CM | POA: Diagnosis not present

## 2023-06-05 DIAGNOSIS — Z87891 Personal history of nicotine dependence: Secondary | ICD-10-CM

## 2023-06-05 DIAGNOSIS — E875 Hyperkalemia: Secondary | ICD-10-CM | POA: Diagnosis not present

## 2023-06-05 DIAGNOSIS — Z8249 Family history of ischemic heart disease and other diseases of the circulatory system: Secondary | ICD-10-CM

## 2023-06-05 DIAGNOSIS — Z6841 Body Mass Index (BMI) 40.0 and over, adult: Secondary | ICD-10-CM | POA: Diagnosis not present

## 2023-06-05 DIAGNOSIS — R579 Shock, unspecified: Secondary | ICD-10-CM | POA: Diagnosis not present

## 2023-06-05 DIAGNOSIS — J9811 Atelectasis: Secondary | ICD-10-CM | POA: Diagnosis not present

## 2023-06-05 DIAGNOSIS — Z794 Long term (current) use of insulin: Secondary | ICD-10-CM

## 2023-06-05 DIAGNOSIS — I5020 Unspecified systolic (congestive) heart failure: Secondary | ICD-10-CM | POA: Diagnosis not present

## 2023-06-05 DIAGNOSIS — E878 Other disorders of electrolyte and fluid balance, not elsewhere classified: Secondary | ICD-10-CM | POA: Diagnosis not present

## 2023-06-05 DIAGNOSIS — Z7982 Long term (current) use of aspirin: Secondary | ICD-10-CM

## 2023-06-05 DIAGNOSIS — Z7984 Long term (current) use of oral hypoglycemic drugs: Secondary | ICD-10-CM

## 2023-06-05 DIAGNOSIS — Z7189 Other specified counseling: Secondary | ICD-10-CM | POA: Diagnosis not present

## 2023-06-05 DIAGNOSIS — Z1152 Encounter for screening for COVID-19: Secondary | ICD-10-CM | POA: Diagnosis not present

## 2023-06-05 DIAGNOSIS — E1165 Type 2 diabetes mellitus with hyperglycemia: Secondary | ICD-10-CM | POA: Diagnosis not present

## 2023-06-05 DIAGNOSIS — E119 Type 2 diabetes mellitus without complications: Secondary | ICD-10-CM

## 2023-06-05 DIAGNOSIS — J9601 Acute respiratory failure with hypoxia: Secondary | ICD-10-CM | POA: Diagnosis not present

## 2023-06-05 DIAGNOSIS — N1832 Chronic kidney disease, stage 3b: Secondary | ICD-10-CM | POA: Diagnosis present

## 2023-06-05 DIAGNOSIS — R570 Cardiogenic shock: Secondary | ICD-10-CM | POA: Diagnosis not present

## 2023-06-05 DIAGNOSIS — I639 Cerebral infarction, unspecified: Principal | ICD-10-CM

## 2023-06-05 DIAGNOSIS — B961 Klebsiella pneumoniae [K. pneumoniae] as the cause of diseases classified elsewhere: Secondary | ICD-10-CM | POA: Diagnosis not present

## 2023-06-05 DIAGNOSIS — I509 Heart failure, unspecified: Secondary | ICD-10-CM | POA: Diagnosis not present

## 2023-06-05 DIAGNOSIS — R578 Other shock: Secondary | ICD-10-CM | POA: Diagnosis not present

## 2023-06-05 DIAGNOSIS — K56609 Unspecified intestinal obstruction, unspecified as to partial versus complete obstruction: Secondary | ICD-10-CM | POA: Diagnosis not present

## 2023-06-05 DIAGNOSIS — N183 Chronic kidney disease, stage 3 unspecified: Secondary | ICD-10-CM | POA: Diagnosis not present

## 2023-06-05 DIAGNOSIS — E1142 Type 2 diabetes mellitus with diabetic polyneuropathy: Secondary | ICD-10-CM | POA: Diagnosis present

## 2023-06-05 DIAGNOSIS — I5043 Acute on chronic combined systolic (congestive) and diastolic (congestive) heart failure: Secondary | ICD-10-CM | POA: Diagnosis present

## 2023-06-05 DIAGNOSIS — E440 Moderate protein-calorie malnutrition: Secondary | ICD-10-CM | POA: Diagnosis present

## 2023-06-05 DIAGNOSIS — K922 Gastrointestinal hemorrhage, unspecified: Secondary | ICD-10-CM | POA: Diagnosis not present

## 2023-06-05 DIAGNOSIS — I472 Ventricular tachycardia, unspecified: Secondary | ICD-10-CM | POA: Diagnosis not present

## 2023-06-05 DIAGNOSIS — Z515 Encounter for palliative care: Secondary | ICD-10-CM

## 2023-06-05 DIAGNOSIS — D62 Acute posthemorrhagic anemia: Secondary | ICD-10-CM | POA: Diagnosis not present

## 2023-06-05 DIAGNOSIS — J9382 Other air leak: Secondary | ICD-10-CM | POA: Diagnosis not present

## 2023-06-05 DIAGNOSIS — Z66 Do not resuscitate: Secondary | ICD-10-CM | POA: Diagnosis not present

## 2023-06-05 DIAGNOSIS — Z79899 Other long term (current) drug therapy: Secondary | ICD-10-CM

## 2023-06-05 DIAGNOSIS — I5023 Acute on chronic systolic (congestive) heart failure: Secondary | ICD-10-CM | POA: Diagnosis not present

## 2023-06-05 DIAGNOSIS — R29728 NIHSS score 28: Secondary | ICD-10-CM | POA: Diagnosis present

## 2023-06-05 DIAGNOSIS — R6521 Severe sepsis with septic shock: Secondary | ICD-10-CM | POA: Diagnosis not present

## 2023-06-05 DIAGNOSIS — G9341 Metabolic encephalopathy: Secondary | ICD-10-CM | POA: Diagnosis present

## 2023-06-05 DIAGNOSIS — E870 Hyperosmolality and hypernatremia: Secondary | ICD-10-CM | POA: Diagnosis not present

## 2023-06-05 DIAGNOSIS — E1122 Type 2 diabetes mellitus with diabetic chronic kidney disease: Secondary | ICD-10-CM | POA: Diagnosis present

## 2023-06-05 DIAGNOSIS — A419 Sepsis, unspecified organism: Secondary | ICD-10-CM | POA: Diagnosis not present

## 2023-06-05 DIAGNOSIS — Z8546 Personal history of malignant neoplasm of prostate: Secondary | ICD-10-CM

## 2023-06-05 DIAGNOSIS — I10 Essential (primary) hypertension: Secondary | ICD-10-CM | POA: Diagnosis not present

## 2023-06-05 DIAGNOSIS — E871 Hypo-osmolality and hyponatremia: Secondary | ICD-10-CM | POA: Diagnosis not present

## 2023-06-05 DIAGNOSIS — I63512 Cerebral infarction due to unspecified occlusion or stenosis of left middle cerebral artery: Principal | ICD-10-CM | POA: Diagnosis present

## 2023-06-05 DIAGNOSIS — M109 Gout, unspecified: Secondary | ICD-10-CM | POA: Diagnosis present

## 2023-06-05 DIAGNOSIS — I130 Hypertensive heart and chronic kidney disease with heart failure and stage 1 through stage 4 chronic kidney disease, or unspecified chronic kidney disease: Secondary | ICD-10-CM | POA: Diagnosis present

## 2023-06-05 DIAGNOSIS — I6389 Other cerebral infarction: Secondary | ICD-10-CM

## 2023-06-05 DIAGNOSIS — Z711 Person with feared health complaint in whom no diagnosis is made: Secondary | ICD-10-CM | POA: Diagnosis not present

## 2023-06-05 DIAGNOSIS — I618 Other nontraumatic intracerebral hemorrhage: Secondary | ICD-10-CM | POA: Diagnosis present

## 2023-06-05 DIAGNOSIS — R627 Adult failure to thrive: Secondary | ICD-10-CM | POA: Diagnosis present

## 2023-06-05 DIAGNOSIS — D631 Anemia in chronic kidney disease: Secondary | ICD-10-CM | POA: Diagnosis present

## 2023-06-05 DIAGNOSIS — R451 Restlessness and agitation: Secondary | ICD-10-CM | POA: Diagnosis not present

## 2023-06-05 DIAGNOSIS — G9340 Encephalopathy, unspecified: Secondary | ICD-10-CM | POA: Diagnosis not present

## 2023-06-05 DIAGNOSIS — R4701 Aphasia: Secondary | ICD-10-CM | POA: Diagnosis present

## 2023-06-05 DIAGNOSIS — R29810 Facial weakness: Secondary | ICD-10-CM | POA: Diagnosis present

## 2023-06-05 DIAGNOSIS — Z781 Physical restraint status: Secondary | ICD-10-CM

## 2023-06-05 DIAGNOSIS — G8191 Hemiplegia, unspecified affecting right dominant side: Secondary | ICD-10-CM | POA: Diagnosis present

## 2023-06-05 DIAGNOSIS — L89892 Pressure ulcer of other site, stage 2: Secondary | ICD-10-CM

## 2023-06-05 HISTORY — PX: RADIOLOGY WITH ANESTHESIA: SHX6223

## 2023-06-05 HISTORY — PX: IR CT HEAD LTD: IMG2386

## 2023-06-05 HISTORY — PX: IR INTRAVSC STENT CERV CAROTID W/O EMB-PROT MOD SED INC ANGIO: IMG2304

## 2023-06-05 HISTORY — PX: IR PERCUTANEOUS ART THROMBECTOMY/INFUSION INTRACRANIAL INC DIAG ANGIO: IMG6087

## 2023-06-05 HISTORY — PX: IR US GUIDE VASC ACCESS RIGHT: IMG2390

## 2023-06-05 LAB — COMPREHENSIVE METABOLIC PANEL
ALT: 13 U/L (ref 0–44)
AST: 20 U/L (ref 15–41)
Albumin: 3.6 g/dL (ref 3.5–5.0)
Alkaline Phosphatase: 76 U/L (ref 38–126)
Anion gap: 13 (ref 5–15)
BUN: 18 mg/dL (ref 8–23)
CO2: 20 mmol/L — ABNORMAL LOW (ref 22–32)
Calcium: 9.4 mg/dL (ref 8.9–10.3)
Chloride: 109 mmol/L (ref 98–111)
Creatinine, Ser: 1.59 mg/dL — ABNORMAL HIGH (ref 0.61–1.24)
GFR, Estimated: 43 mL/min — ABNORMAL LOW (ref >60)
Glucose, Bld: 203 mg/dL — ABNORMAL HIGH (ref 70–99)
Potassium: 3.8 mmol/L (ref 3.5–5.1)
Sodium: 142 mmol/L (ref 135–145)
Total Bilirubin: 1.2 mg/dL (ref 0.0–1.2)
Total Protein: 6.5 g/dL (ref 6.5–8.1)

## 2023-06-05 LAB — POCT I-STAT, CHEM 8
BUN: 21 mg/dL (ref 8–23)
Calcium, Ion: 1.18 mmol/L (ref 1.15–1.40)
Chloride: 108 mmol/L (ref 98–111)
Creatinine, Ser: 1.6 mg/dL — ABNORMAL HIGH (ref 0.61–1.24)
Glucose, Bld: 201 mg/dL — ABNORMAL HIGH (ref 70–99)
HCT: 37 % — ABNORMAL LOW (ref 39.0–52.0)
Hemoglobin: 12.6 g/dL — ABNORMAL LOW (ref 13.0–17.0)
Potassium: 3.7 mmol/L (ref 3.5–5.1)
Sodium: 142 mmol/L (ref 135–145)
TCO2: 25 mmol/L (ref 22–32)

## 2023-06-05 LAB — ECHOCARDIOGRAM COMPLETE
Est EF: 30
S' Lateral: 4.4 cm
Weight: 4677.28 [oz_av]

## 2023-06-05 LAB — URINALYSIS, COMPLETE (UACMP) WITH MICROSCOPIC
Bacteria, UA: NONE SEEN
Bilirubin Urine: NEGATIVE
Glucose, UA: NEGATIVE mg/dL
Hgb urine dipstick: NEGATIVE
Ketones, ur: 5 mg/dL — AB
Leukocytes,Ua: NEGATIVE
Nitrite: NEGATIVE
Protein, ur: NEGATIVE mg/dL
Specific Gravity, Urine: >1.046 — ABNORMAL HIGH (ref 1.005–1.030)
pH: 5 (ref 5.0–8.0)

## 2023-06-05 LAB — DIFFERENTIAL
Abs Immature Granulocytes: 0.02 10*3/uL (ref 0.00–0.07)
Basophils Absolute: 0 10*3/uL (ref 0.0–0.1)
Basophils Relative: 0 %
Eosinophils Absolute: 0.1 10*3/uL (ref 0.0–0.5)
Eosinophils Relative: 1 %
Immature Granulocytes: 0 %
Lymphocytes Relative: 48 %
Lymphs Abs: 3.2 10*3/uL (ref 0.7–4.0)
Monocytes Absolute: 0.4 10*3/uL (ref 0.1–1.0)
Monocytes Relative: 6 %
Neutro Abs: 3.1 10*3/uL (ref 1.7–7.7)
Neutrophils Relative %: 45 %

## 2023-06-05 LAB — RAPID URINE DRUG SCREEN, HOSP PERFORMED
Amphetamines: NOT DETECTED
Barbiturates: NOT DETECTED
Benzodiazepines: NOT DETECTED
Cocaine: NOT DETECTED
Opiates: NOT DETECTED
Tetrahydrocannabinol: NOT DETECTED

## 2023-06-05 LAB — GLUCOSE, CAPILLARY
Glucose-Capillary: 132 mg/dL — ABNORMAL HIGH (ref 70–99)
Glucose-Capillary: 160 mg/dL — ABNORMAL HIGH (ref 70–99)
Glucose-Capillary: 167 mg/dL — ABNORMAL HIGH (ref 70–99)

## 2023-06-05 LAB — CBC
HCT: 37.8 % — ABNORMAL LOW (ref 39.0–52.0)
Hemoglobin: 11.9 g/dL — ABNORMAL LOW (ref 13.0–17.0)
MCH: 30.7 pg (ref 26.0–34.0)
MCHC: 31.5 g/dL (ref 30.0–36.0)
MCV: 97.4 fL (ref 80.0–100.0)
Platelets: 181 10*3/uL (ref 150–400)
RBC: 3.88 MIL/uL — ABNORMAL LOW (ref 4.22–5.81)
RDW: 13.5 % (ref 11.5–15.5)
WBC: 6.8 10*3/uL (ref 4.0–10.5)
nRBC: 0 % (ref 0.0–0.2)

## 2023-06-05 LAB — HEMOGLOBIN A1C
Hgb A1c MFr Bld: 6.1 % — ABNORMAL HIGH (ref 4.8–5.6)
Mean Plasma Glucose: 128.37 mg/dL

## 2023-06-05 LAB — PROTIME-INR
INR: 1 (ref 0.8–1.2)
Prothrombin Time: 13.7 s (ref 11.4–15.2)

## 2023-06-05 LAB — SARS CORONAVIRUS 2 BY RT PCR: SARS Coronavirus 2 by RT PCR: NEGATIVE

## 2023-06-05 LAB — MAGNESIUM
Magnesium: 1.8 mg/dL (ref 1.7–2.4)
Magnesium: 1.9 mg/dL (ref 1.7–2.4)

## 2023-06-05 LAB — CBG MONITORING, ED
Glucose-Capillary: 195 mg/dL — ABNORMAL HIGH (ref 70–99)
Glucose-Capillary: 180 mg/dL — ABNORMAL HIGH (ref 70–99)

## 2023-06-05 LAB — PHOSPHORUS
Phosphorus: 2.5 mg/dL (ref 2.5–4.6)
Phosphorus: 1.9 mg/dL — ABNORMAL LOW (ref 2.5–4.6)

## 2023-06-05 LAB — ETHANOL: Alcohol, Ethyl (B): <10 mg/dL (ref <10)

## 2023-06-05 LAB — APTT: aPTT: 22 s — ABNORMAL LOW (ref 24–36)

## 2023-06-05 SURGERY — IR WITH ANESTHESIA
Anesthesia: General

## 2023-06-05 MED ORDER — PANTOPRAZOLE SODIUM 40 MG IV SOLR
40.0000 mg | Freq: Every day | INTRAVENOUS | Status: DC
  Administered 2023-06-05 – 2023-06-06 (×2): 40 mg via INTRAVENOUS
  Filled 2023-06-05 (×2): qty 10

## 2023-06-05 MED ORDER — IOHEXOL 350 MG/ML SOLN
100.0000 mL | Freq: Once | INTRAVENOUS | Status: AC | PRN
  Administered 2023-06-05: 100 mL via INTRAVENOUS

## 2023-06-05 MED ORDER — POTASSIUM PHOSPHATES 15 MMOLE/5ML IV SOLN
30.0000 mmol | Freq: Once | INTRAVENOUS | Status: AC
  Administered 2023-06-05: 30 mmol via INTRAVENOUS
  Filled 2023-06-05: qty 10

## 2023-06-05 MED ORDER — SODIUM CHLORIDE 0.9 % IV BOLUS: 250.0000 mL | INTRAVENOUS | Status: DC | PRN

## 2023-06-05 MED ORDER — LABETALOL HCL 5 MG/ML IV SOLN
10.0000 mg | INTRAVENOUS | Status: DC | PRN
Start: 2023-06-05 — End: 2023-06-15

## 2023-06-05 MED ORDER — FENTANYL CITRATE (PF) 250 MCG/5ML IJ SOLN
INTRAMUSCULAR | Status: DC | PRN
  Administered 2023-06-05: 50 ug via INTRAVENOUS

## 2023-06-05 MED ORDER — LABETALOL HCL 5 MG/ML IV SOLN: 20.0000 mg | Freq: Once | INTRAVENOUS | Status: DC

## 2023-06-05 MED ORDER — ONDANSETRON HCL 4 MG/2ML IJ SOLN
INTRAMUSCULAR | Status: DC | PRN
  Administered 2023-06-05: 4 mg via INTRAVENOUS

## 2023-06-05 MED ORDER — FENTANYL CITRATE (PF) 100 MCG/2ML IJ SOLN
INTRAMUSCULAR | Status: AC
  Filled 2023-06-05: qty 2

## 2023-06-05 MED ORDER — SODIUM CHLORIDE 0.9 % IV SOLN: INTRAVENOUS | Status: DC

## 2023-06-05 MED ORDER — TICAGRELOR 90 MG PO TABS: 90.0000 mg | ORAL_TABLET | Freq: Once | ORAL | Status: DC

## 2023-06-05 MED ORDER — HYDRALAZINE HCL 20 MG/ML IJ SOLN
10.0000 mg | INTRAMUSCULAR | Status: DC | PRN
Start: 2023-06-05 — End: 2023-06-15

## 2023-06-05 MED ORDER — CLEVIDIPINE BUTYRATE 0.5 MG/ML IV EMUL: 0.0000 mg/h | INTRAVENOUS | Status: DC

## 2023-06-05 MED ORDER — TICAGRELOR 90 MG PO TABS
90.0000 mg | ORAL_TABLET | Freq: Once | ORAL | Status: AC
  Administered 2023-06-05: 90 mg
  Filled 2023-06-05: qty 1

## 2023-06-05 MED ORDER — SODIUM CHLORIDE 0.9 % IV SOLN
INTRAVENOUS | Status: AC | PRN
  Administered 2023-06-05: 2 ug/kg/min via INTRAVENOUS

## 2023-06-05 MED ORDER — MAGNESIUM SULFATE 2 GM/50ML IV SOLN
2.0000 g | Freq: Once | INTRAVENOUS | Status: AC
  Administered 2023-06-05: 2 g via INTRAVENOUS
  Filled 2023-06-05: qty 50

## 2023-06-05 MED ORDER — PROPOFOL 10 MG/ML IV BOLUS
INTRAVENOUS | Status: DC | PRN
  Administered 2023-06-05: 100 mg via INTRAVENOUS

## 2023-06-05 MED ORDER — INSULIN ASPART 100 UNIT/ML IJ SOLN
2.0000 [IU] | INTRAMUSCULAR | Status: DC
  Administered 2023-06-05 (×2): 4 [IU] via SUBCUTANEOUS
  Administered 2023-06-06: 2 [IU] via SUBCUTANEOUS
  Administered 2023-06-06: 4 [IU] via SUBCUTANEOUS
  Administered 2023-06-06 (×2): 2 [IU] via SUBCUTANEOUS
  Administered 2023-06-06 – 2023-06-07 (×5): 4 [IU] via SUBCUTANEOUS
  Administered 2023-06-07: 2 [IU] via SUBCUTANEOUS
  Administered 2023-06-07: 4 [IU] via SUBCUTANEOUS
  Administered 2023-06-08: 6 [IU] via SUBCUTANEOUS

## 2023-06-05 MED ORDER — TICAGRELOR 90 MG PO TABS: 90.0000 mg | ORAL_TABLET | Freq: Two times a day (BID) | ORAL | Status: DC

## 2023-06-05 MED ORDER — IOHEXOL 300 MG/ML  SOLN
150.0000 mL | Freq: Once | INTRAMUSCULAR | Status: AC | PRN
  Administered 2023-06-05: 80 mL via INTRA_ARTERIAL

## 2023-06-05 MED ORDER — CANGRELOR BOLUS VIA INFUSION
INTRAVENOUS | Status: AC | PRN
  Administered 2023-06-05: 1989 ug via INTRAVENOUS

## 2023-06-05 MED ORDER — ACETAMINOPHEN 160 MG/5ML PO SOLN
650.0000 mg | ORAL | Status: DC | PRN
  Administered 2023-06-06 – 2023-07-04 (×9): 650 mg
  Filled 2023-06-05 (×11): qty 20.3

## 2023-06-05 MED ORDER — ASPIRIN 81 MG PO CHEW
81.0000 mg | CHEWABLE_TABLET | Freq: Every day | ORAL | Status: DC
  Administered 2023-06-06 – 2023-06-11 (×6): 81 mg
  Filled 2023-06-05 (×6): qty 1

## 2023-06-05 MED ORDER — ROCURONIUM BROMIDE 10 MG/ML (PF) SYRINGE
PREFILLED_SYRINGE | INTRAVENOUS | Status: DC | PRN
  Administered 2023-06-05: 80 mg via INTRAVENOUS

## 2023-06-05 MED ORDER — SODIUM CHLORIDE 0.9% FLUSH: 3.0000 mL | Freq: Once | INTRAVENOUS | Status: DC

## 2023-06-05 MED ORDER — PHENYLEPHRINE HCL-NACL 20-0.9 MG/250ML-% IV SOLN
INTRAVENOUS | Status: DC | PRN
  Administered 2023-06-05: 50 ug/min via INTRAVENOUS

## 2023-06-05 MED ORDER — ASPIRIN 81 MG PO CHEW
81.0000 mg | CHEWABLE_TABLET | Freq: Every day | ORAL | Status: DC
  Administered 2023-06-05: 81 mg via ORAL
  Filled 2023-06-05: qty 1

## 2023-06-05 MED ORDER — ACETAMINOPHEN 325 MG PO TABS
650.0000 mg | ORAL_TABLET | ORAL | Status: DC | PRN
  Administered 2023-06-09: 650 mg via ORAL
  Filled 2023-06-05 (×2): qty 2

## 2023-06-05 MED ORDER — STROKE: EARLY STAGES OF RECOVERY BOOK
Freq: Once | Status: AC
  Filled 2023-06-05: qty 1

## 2023-06-05 MED ORDER — PERFLUTREN LIPID MICROSPHERE
1.0000 mL | INTRAVENOUS | Status: AC | PRN
  Administered 2023-06-05: 3 mL via INTRAVENOUS

## 2023-06-05 MED ORDER — TICAGRELOR 90 MG PO TABS
90.0000 mg | ORAL_TABLET | Freq: Two times a day (BID) | ORAL | Status: DC
Start: 2023-06-06 — End: 2023-06-06
  Administered 2023-06-06: 90 mg
  Filled 2023-06-05: qty 1

## 2023-06-05 MED ORDER — SODIUM CHLORIDE 0.9 % IV SOLN
2.0000 ug/kg/min | INTRAVENOUS | Status: DC
  Administered 2023-06-05: 2 ug/kg/min via INTRAVENOUS
  Filled 2023-06-05 (×2): qty 50

## 2023-06-05 MED ORDER — VITAL HIGH PROTEIN PO LIQD
1000.0000 mL | ORAL | Status: DC
  Administered 2023-06-05: 1000 mL

## 2023-06-05 MED ORDER — ACETAMINOPHEN 650 MG RE SUPP: 650.0000 mg | RECTAL | Status: DC | PRN

## 2023-06-05 MED ORDER — SUGAMMADEX SODIUM 200 MG/2ML IV SOLN
INTRAVENOUS | Status: DC | PRN
  Administered 2023-06-05: 400 mg via INTRAVENOUS

## 2023-06-05 MED ORDER — DEXAMETHASONE SODIUM PHOSPHATE 10 MG/ML IJ SOLN
INTRAMUSCULAR | Status: DC | PRN
  Administered 2023-06-05: 8 mg via INTRAVENOUS

## 2023-06-05 MED ORDER — PROSOURCE TF20 ENFIT COMPATIBL EN LIQD
60.0000 mL | Freq: Every day | ENTERAL | Status: DC
  Administered 2023-06-05 – 2023-06-09 (×5): 60 mL
  Filled 2023-06-05 (×5): qty 60

## 2023-06-05 MED ORDER — GLYCOPYRROLATE 0.2 MG/ML IJ SOLN
INTRAMUSCULAR | Status: DC | PRN
  Administered 2023-06-05: .1 mg via INTRAVENOUS
  Administered 2023-06-05: .2 mg via INTRAVENOUS

## 2023-06-05 MED ORDER — SENNOSIDES-DOCUSATE SODIUM 8.6-50 MG PO TABS
1.0000 | ORAL_TABLET | Freq: Every evening | ORAL | Status: DC | PRN
  Administered 2023-06-16: 1 via ORAL
  Filled 2023-06-05: qty 1

## 2023-06-05 MED ORDER — TICAGRELOR 90 MG PO TABS
180.0000 mg | ORAL_TABLET | Freq: Once | ORAL | Status: AC
  Administered 2023-06-05: 180 mg via ORAL
  Filled 2023-06-05: qty 2

## 2023-06-05 MED ORDER — LIDOCAINE 2% (20 MG/ML) 5 ML SYRINGE
INTRAMUSCULAR | Status: DC | PRN
  Administered 2023-06-05: 100 mg via INTRAVENOUS

## 2023-06-05 NOTE — Progress Notes (Signed)
 Brief Nutrition Note  Patient planned for Cortrak placement. Discussed with PA. Agree to initiation of TF via Cortrak once place.   Adult Enteral Nutrition Protocol initiated. Full assessment to follow.  Cortrak placement pending at this time. Discussed with RN to initiate TF once xray confirms appropriate placement.   Admitting Dx: Acute ischemic stroke (HCC) [I63.9] Cerebrovascular accident (CVA), unspecified mechanism (HCC) [I63.9]  Body mass index is 41.94 kg/m. Pt meets criteria for morbid obesity based on current BMI.  Labs:  Recent Labs  Lab 06/05/23 0815 06/05/23 0817  NA 142 142  K 3.8 3.7  CL 109 108  CO2 20*  --   BUN 18 21  CREATININE 1.59* 1.60*  CALCIUM  9.4  --   GLUCOSE 203* 201*    Nathan Proctor, RDN, LDN Clinical Nutrition

## 2023-06-05 NOTE — ED Provider Notes (Signed)
 Nathan Proctor EMERGENCY DEPARTMENT AT [facility] Provider Note   CSN: [id number] Arrival date & time: 06/05/23  9188  An emergency department physician performed an initial assessment on this suspected stroke patient at [phone].  History  Chief Complaint  Patient presents with   Code Stroke    Nathan Proctor. is a 83 y.o. male.  Patient is an 83 year old male with a past medical history of prior CVA without residual deficits, hypertension, diabetes, CKD that presented to the emergency department as a code stroke.  Per EMS the patient was last seen normal around 10 PM last night.  When family went to wake him up this morning they found him aphasic and unable to get out of bed.  EMS reports that he has right sided weakness, aphasia and does not seem to be following commands appropriately.  The history is provided by the EMS personnel. The history is limited by the condition of the patient.       Home Medications Prior to Admission medications   Medication Sig Start Date End Date Taking? Authorizing Provider  acetaminophen  (TYLENOL ) 325 MG tablet Take 1-2 tablets (325-650 mg total) by mouth every 4 (four) hours as needed for mild pain. 10/14/19   Love, Sharlet RAMAN, PA-C  allopurinol  (ZYLOPRIM ) 100 MG tablet Take 100 mg by mouth daily.  01/25/16   [provider]  amLODipine  (NORVASC ) 2.5 MG tablet 10 mg daily.  11/12/19   [provider]  aspirin  EC 81 MG EC tablet Take 1 tablet (81 mg total) by mouth daily. 10/08/19   Danford, Lonni SQUIBB, MD  atorvastatin  (LIPITOR ) 80 MG tablet Take 1 tablet (80 mg total) by mouth daily. 10/14/19   Love, Sharlet RAMAN, PA-C  benzonatate  (TESSALON ) 100 MG capsule Take 1 capsule (100 mg total) by mouth every 8 (eight) hours as needed for cough. 04/01/22   Mound, Haley E, FNP  carvedilol  (COREG ) 25 MG tablet TAKE 1 TABLET BY MOUTH TWICE DAILY WITH A MEAL 09/05/20   Lesia Ozell Barter, PA-C  clopidogrel  (PLAVIX ) 75 MG tablet Take 1  tablet (75 mg total) by mouth daily. 10/14/19   Love, Sharlet RAMAN, PA-C  diclofenac Sodium (VOLTAREN) 1 % GEL  11/26/19   [provider]  fluticasone  (FLONASE ) 50 MCG/ACT nasal spray Place 1 spray into both nostrils daily. 04/01/22   Hazen Darryle BRAVO, FNP  gabapentin  (NEURONTIN ) 300 MG capsule Take 300 mg by mouth 3 (three) times daily. 02/05/22   [provider]  insulin  NPH-regular Human (70-30) 100 UNIT/ML injection Inject 5 Units into the skin daily. 10/14/19   Love, Sharlet RAMAN, PA-C  JARDIANCE  10 MG TABS tablet Take 10 mg by mouth daily. 03/31/22   [provider]  losartan  (COZAAR ) 25 MG tablet Take 1 tablet (25 mg total) by mouth daily. 12/23/19 03/22/20  Lesia Ozell Barter, PA-C  meclizine  (ANTIVERT ) 25 MG tablet Take 1 tablet (25 mg total) by mouth 2 (two) times daily as needed for dizziness. 01/07/23   Midge Golas, MD  Vitamin D, Ergocalciferol, (DRISDOL) 50000 units CAPS capsule Take 50,000 Units by mouth every 7 (seven) days.     [provider]      Allergies    Patient has no known allergies.    Review of Systems   Review of Systems  Physical Exam Updated Vital Signs BP (!) 154/67   Pulse 86   Resp 20   Wt 132.6 kg   SpO2 100%   BMI 41.94 kg/m  Physical Exam Vitals and nursing note reviewed.  Constitutional:      General: He is not in acute distress.    Appearance: Normal appearance.  HENT:     Head: Normocephalic and atraumatic.     Nose: Nose normal.  Eyes:     Extraocular Movements: Extraocular movements intact.     Conjunctiva/sclera: Conjunctivae normal.  Cardiovascular:     Rate and Rhythm: Normal rate.  Pulmonary:     Effort: Pulmonary effort is normal.  Abdominal:     General: Abdomen is flat.  Musculoskeletal:     Cervical back: Normal range of motion.     Right lower leg: No edema.     Left lower leg: No edema.  Skin:    General: Skin is warm and dry.  Neurological:     Mental Status: He is alert.     Comments:  Aphasic, not speaking any words  R-sided hemiplegia, no drift in LUE, +drift in LLE though does have some effort Not withdrawing to pain Does track with eyes bilaterally R-sided facial droop     ED Results / Procedures / Treatments   Labs (all labs ordered are listed, but only abnormal results are displayed) Labs Reviewed  CBC - Abnormal; Notable for the following components:      Result Value   RBC 3.88 (*)    Hemoglobin 11.9 (*)    HCT 37.8 (*)    All other components within normal limits  COMPREHENSIVE METABOLIC PANEL - Abnormal; Notable for the following components:   CO2 20 (*)    Glucose, Bld 203 (*)    Creatinine, Ser 1.59 (*)    GFR, Estimated 43 (*)    All other components within normal limits  CBG MONITORING, ED - Abnormal; Notable for the following components:   Glucose-Capillary 195 (*)    All other components within normal limits  POCT I-STAT, CHEM 8 - Abnormal; Notable for the following components:   Creatinine, Ser 1.60 (*)    Glucose, Bld 201 (*)    Hemoglobin 12.6 (*)    HCT 37.0 (*)    All other components within normal limits  SARS CORONAVIRUS 2 BY RT PCR  PROTIME-INR  DIFFERENTIAL  APTT  ETHANOL  HEMOGLOBIN A1C  RAPID URINE DRUG SCREEN, HOSP PERFORMED  URINALYSIS, COMPLETE (UACMP) WITH MICROSCOPIC  I-STAT CHEM 8, ED    EKG None  Radiology CT ANGIO HEAD NECK W WO CM W PERF (CODE STROKE) Result Date: 06/05/2023 CLINICAL DATA:  Provided history: Cerebrovascular accident, unspecified mechanism. Right-sided weakness. Right-sided facial droop. Altered mental status. EXAM: CT ANGIOGRAPHY HEAD AND NECK CT PERFUSION BRAIN TECHNIQUE: Multidetector CT imaging of the head and neck was performed using the standard protocol during bolus administration of intravenous contrast. Multiplanar CT image reconstructions and MIPs were obtained to evaluate the vascular anatomy. Carotid stenosis measurements (when applicable) are obtained utilizing NASCET criteria, using  the distal internal carotid diameter as the denominator. Multiphase CT imaging of the brain was performed following IV bolus contrast injection. Subsequent parametric perfusion maps were calculated using RAPID software. RADIATION DOSE REDUCTION: This exam was performed according to the departmental dose-optimization program which includes automated exposure control, adjustment of the mA and/or kV according to patient size and/or use of iterative reconstruction technique. CONTRAST:  OMNIPAQUE  IOHEXOL  350 MG/ML SOLN COMPARISON:  Noncontrast head CT performed earlier today 06/05/2023. MRA head and MRA neck 10/04/2019. FINDINGS: CTA NECK FINDINGS Aortic arch: Common origin of the innominate and left common carotid  arteries. Atherosclerotic plaque within the visualized thoracic aorta and proximal major branch vessels of the neck. Streak/beam hardening artifact arising from a dense right-sided contrast bolus partially obscures the right subclavian artery. Within this limitation, there is no appreciable hemodynamically significant innominate or proximal subclavian artery stenosis. Right carotid system: CCA and ICA patent within the neck. Atherosclerotic plaque, greatest about the carotid bifurcation. Resultant 40% stenosis at the ICA origin. Left carotid system: CCA and ICA patent within the neck. Atherosclerotic plaque. Most notably, there is prominent atherosclerotic plaque about the carotid bifurcation and within the proximal ICA which has progressed from the prior MRA neck of 10/04/2019. Resultant severe (near occlusive) stenosis of the proximal ICA. Tortuosity of the cervical ICA Vertebral arteries: The vertebral arteries are patent within the neck. Streak/beam hardening artifact limits evaluation of the right vertebral artery origin. At least moderate stenosis is suspected at this site. Atherosclerotic plaque scattered elsewhere within the cervical right vertebral artery with no more than mild stenosis.  Calcified atherosclerotic plaque at the left vertebral artery origin with suspected at least moderate stenosis. Nonstenotic calcified plaque elsewhere within the cervical left vertebral artery. Skeleton: Cervical spondylosis. Other neck: No neck mass or cervical lymphadenopathy. Upper chest: No consolidation within the imaged lung apices. Review of the MIP images confirms the above findings CTA HEAD FINDINGS Anterior circulation: The intracranial internal carotid arteries are patent. As sclerotic plaque within both vessels. No more than mild stenosis on the right. Up to moderate stenosis within the left cavernous segment. The M1 middle cerebral arteries are patent. Abrupt occlusion of a proximal M2 left middle cerebral artery vessel (series 11, image 22). Atherosclerotic irregularity of the M2 and more distal MCA vessels elsewhere. The anterior cerebral arteries are patent. Atherosclerotic irregularity of both vessels without high-grade proximal stenosis. A possible 2 mm periophthalmic left ICA aneurysm with better appreciated on the prior MRA head of 10/04/2019. Posterior circulation: The intracranial vertebral arteries are patent. Atherosclerotic plaque within the right V4 segment sites of mild stenosis. Non-stenotic atherosclerotic plaque within the left V4 segment. The basilar artery is patent. The posterior cerebral arteries are patent. Posterior communicating arteries are diminutive or absent, bilaterally. Venous sinuses: Assessment for dural venous sinus thrombosis is limited due to contrast timing. Anatomic variants: As described. Review of the MIP images confirms the above findings CT Brain Perfusion Findings: ASPECTS: CBF (<30%) Volume: 41mL Perfusion (Tmax>6.0s) volume: 86mL Mismatch Volume: 45mL Infarction Location:Left MCA vascular territory CTA head impression #1, the CT perfusion head impression and the presence of a severe stenosis of the proximal cervical left ICA called by telephone at the time of  interpretation on 06/05/2023 at 8:40 am to provider ERIC [facility] , who verbally acknowledged these results. IMPRESSION: CTA neck: 1. No common carotid and internal carotid arteries are patent within the neck. Atherosclerotic plaque bilaterally. Most notably, there is progressive atherosclerotic plaque about the left carotid bifurcation and within the proximal left ICA with resultant severe, near occlusive stenosis of the proximal left ICA. Also of note, atherosclerotic plaque about the right carotid bifurcation results in 40% stenosis at the right ICA origin. 2. The vertebral arteries are patent within the neck. Atherosclerotic plaque bilaterally as described. Most notably, there is suspected at least moderate stenoses at the bilateral vertebral artery origins. 3. Aortic Atherosclerosis (ICD10-I70.0). CTA head: 1. Abrupt occlusion of a proximal M2 left middle cerebral artery vessel. 2. Background intracranial atherosclerotic disease as described. 3. A possible 2 mm periophthalmic left ICA aneurysm was better appreciated on the  prior MRA head of 10/04/2019. CT perfusion head: The perfusion software identifies a 41 mL core infarct in the left MCA vascular territory. The perfusion software identifies an 86 mL region of critically hypoperfused parenchyma within the left MCA vascular territory (utilizing the Tmax>6 seconds threshold). Reported mismatch volume: 45 mL Electronically Signed   By: Rockey Childs D.O.   On: 06/05/2023 09:07   CT HEAD CODE STROKE WO CONTRAST Result Date: 06/05/2023 CLINICAL DATA:  Code stroke. Neuro deficit, acute, stroke suspected. EXAM: CT HEAD WITHOUT CONTRAST TECHNIQUE: Contiguous axial images were obtained from the base of the skull through the vertex without intravenous contrast. RADIATION DOSE REDUCTION: This exam was performed according to the departmental dose-optimization program which includes automated exposure control, adjustment of the mA and/or kV according to patient size and/or  use of iterative reconstruction technique. COMPARISON:  Brain MRI 10/04/2019.  Noncontrast head CT 10/03/2019. FINDINGS: Brain: Generalized cerebral atrophy. Loss of gray-white differentiation consistent with an acute infarct within the left insula and within portions of the left frontal operculum (MCA vascular territory). Known small chronic cortically-based infarcts within the left frontal, left parietal and left occipital lobes were better appreciated on the prior brain MRI of 10/04/2019 (acute at that time). Mild patchy and ill-defined hypoattenuation within the cerebral white matter, nonspecific but compatible with chronic small vessel ischemic disease. Subcentimeter infarct within the superior right cerebellar hemisphere, new from the prior MRI but chronic in appearance. Loss of gray-white differentiation there is no acute intracranial hemorrhage. No extra-axial fluid collection. No evidence of an intracranial mass. No midline shift. Vascular: No hyperdense vessel.  Atherosclerotic calcifications. Skull: No calvarial fracture or aggressive osseous lesion. Sinuses/Orbits: No mass or acute finding within the imaged orbits. No significant paranasal sinus disease. ASPECTS [facility] Stroke Program Early CT Score) - Ganglionic level infarction (caudate, lentiform nuclei, internal capsule, insula, M1-M3 cortex): 5 - Supraganglionic infarction (M4-M6 cortex): 2 Total score (0-10 with 10 being normal): 7 Impression #1 called by telephone at the time of interpretation on 06/05/2023 at 8:40 am to provider Dr. Merrianne, who verbally acknowledged these results. IMPRESSION: 1. Acute left MCA territory infarct affecting the left insula and portions of the left frontal operculum. ASPECTS is 7. 2. Known small chronic cortically-based infarcts within the left frontal, left parietal and left occipital lobes were better appreciated on the prior brain MRI of 10/04/2019 (acute at that time). 3. Background mild cerebral white matter  chronic small vessel ischemic disease. 4. Subcentimeter infarct within the right cerebellar hemisphere, new from prior MRI but chronic in appearance. 5. Generalized cerebral atrophy. Electronically Signed   By: Rockey Childs D.O.   On: 06/05/2023 08:45    Procedures .Critical Care  Performed by: Kingsley, Shaylyn Bawa K, DO Authorized by: Ellouise Richerd POUR, DO   Critical care provider statement:    Critical care time (minutes):  30   Critical care was necessary to treat or prevent imminent or life-threatening deterioration of the following conditions:  CNS failure or compromise   Critical care was time spent personally by me on the following activities:  Development of treatment plan with patient or surrogate, discussions with consultants, evaluation of patient's response to treatment, examination of patient, ordering and review of laboratory studies, ordering and review of radiographic studies, ordering and performing treatments and interventions, pulse oximetry, re-evaluation of patient's condition and review of old charts     Medications Ordered in ED Medications  sodium chloride  flush (NS) 0.9 % injection 3 mL (has no administration in  time range)   stroke: early stages of recovery book (has no administration in time range)  0.9 %  sodium chloride  infusion (has no administration in time range)  acetaminophen  (TYLENOL ) tablet 650 mg (has no administration in time range)    Or  acetaminophen  (TYLENOL ) 160 MG/5ML solution 650 mg (has no administration in time range)    Or  acetaminophen  (TYLENOL ) suppository 650 mg (has no administration in time range)  senna-docusate (Senokot-S) tablet 1 tablet (has no administration in time range)  pantoprazole  (PROTONIX ) injection 40 mg (has no administration in time range)  labetalol  (NORMODYNE ) injection 20 mg (has no administration in time range)    And  clevidipine  (CLEVIPREX ) infusion 0.5 mg/mL (has no administration in time range)  iohexol   (OMNIPAQUE ) 300 MG/ML solution 150 mL (has no administration in time range)  iohexol  (OMNIPAQUE ) 350 MG/ML injection 100 mL (100 mLs Intravenous Contrast Given 06/05/23 0839)    ED Course/ Medical Decision Making/ A&P Clinical Course as of 06/05/23 0922  Wed Jun 05, 2023  0850 Acute L MCA infarct on CTH. Plan for IR thrombectomy. Patient is outside the window for TNK. [VK]    Clinical Course User Index [VK] Kingsley, Keleigh Kazee K, DO                                 Medical Decision Making This patient presents to the ED with chief complaint(s) of R-sided weakness, aphasia with pertinent past medical history of prior CVA without residual deficits, HTN, DM, CKD which further complicates the presenting complaint. The complaint involves an extensive differential diagnosis and also carries with it a high risk of complications and morbidity.    The differential diagnosis includes CVA, TIA, ICH, mass effect, infection, encephalopathy, electrolyte abnormality, hypo or hyperglycemia  Additional history obtained: Additional history obtained from EMS  Records reviewed previous admission documents  ED Course and Reassessment: Patient was made a prehospital arrival code stroke and was immediately evaluated at the door by Dr. Merrianne with neurology and myself.  On patient's arrival he was hemodynamically in no acute distress, airway intact.  Accu-Chek was within normal range.  The patient was found to be aphasic with right sided hemiplegia.  IV access was obtained and he was immediately transported to CT scanner.    Independent labs interpretation:  The following labs were independently interpreted: mild hyperglycemia, otherwise at baseline  Independent visualization of imaging: - I independently visualized the following imaging with scope of interpretation limited to determining acute life threatening conditions related to emergency care: CTH, which revealed acute L MCA infarct  Consultation: -  Consulted or discussed management/test interpretation w/ external professional: neurology  Consideration for admission or further workup: patient requires admission for thrombectomy for acute stroke Social Determinants of health: N/A     Amount and/or Complexity of Data Reviewed Labs: ordered. Radiology: ordered.  Risk Decision regarding hospitalization.          Final Clinical Impression(s) / ED Diagnoses Final diagnoses:  Acute ischemic stroke [facility])    Rx / DC Orders ED Discharge Orders     None         Kingsley, Letita Prentiss K, DO 06/05/23 9077

## 2023-06-05 NOTE — Procedures (Signed)
 INTERVENTIONAL NEURORADIOLOGY BRIEF POSTPROCEDURE NOTE  DIAGNOSTIC CEREBRAL ANGIOGRAM, MECHANICAL THROMBECTOMY, FLAT PANEL HEAD CT, LEFT CAROTID STENTING AND ANGIOPLASTY WITHOUT CEREBRAL PROTECTION DEVICE  Attending physician: Curtis everitt Nile Lizzie, MD  Diagnosis: Left M2 occlusion. Left ICA stenosis.  Access site: Right common femoral artery.  Access closure: 46F angioseal.  Anesthesia: IR sedation: General endotracheal anesthesia.  Medication used: cangrelor  IV bolus and drip.  Complications: None.  Estimated blood loss:  50 mL.  Specimen: None.  Findings: Proximal occlusion of a left M2/MCA anterior division branch. Mechanical thrombectomy performed with direct contact aspiration (x1) achieving complete recanalization (TICI 3). Left ICA stenosis and plaque rupture noted at the bulb, treated with stenting and angioplasty. No hemorrhagic complication.   The patient tolerated the procedure well without incident or complication. He was extubated and sent to PACU in stable condition.   PLAN: - Bed rest  x 6 hours; - SBP 120 - 160 mmHg; - Head CT at 13:00 today to evaluate for bleed; - Continuous cangrelor  infusion until head CT is reviewed and decision is made on transition to oral anti-platelets (brilinta  180 mg + ASA 81 mg).

## 2023-06-05 NOTE — Sedation Documentation (Addendum)
 Patient will be under the care of CRNA/anesthesia. See anesthesia flowsheets.

## 2023-06-05 NOTE — ED Notes (Signed)
 Pt tx to CT 0820 Pt tx to IR 0901

## 2023-06-05 NOTE — Procedures (Signed)
 Cortrak  Tube Type:  Cortrak - 43 inches Tube Location:  Left nare Initial Placement:  Stomach Secured by: Bridle Technique Used to Measure Tube Placement:  Marking at nare/corner of mouth Cortrak Secured At:  75 cm   Cortrak Tube Team Note:  Consult received to place a Cortrak feeding tube.   X-ray is required, abdominal x-ray has been ordered by the Cortrak team. Please confirm tube placement before using the Cortrak tube.   If the tube becomes dislodged please keep the tube and contact the Cortrak team at [email or web address] for replacement.  If after hours and replacement cannot be delayed, place a NG tube and confirm placement with an abdominal x-ray.    Betsey Holiday MS, RD, LDN If unable to be reached, please send secure chat to "RD inpatient" available from 8:00a-4:00p daily

## 2023-06-05 NOTE — Progress Notes (Addendum)
 Patient is s/p Mechanical thrombectomy performed with direct contact aspiration (x1) achieving complete recanalization (TICI 3). Left ICA stenosis and plaque rupture noted at the bulb, treated with stenting and angioplasty. Performed by Dr. Everitt Nile Erichsen today.   Patient seen with Dr. Everitt Nile Erichsen at bedside.  He is laying in bed, NAD, family members and RN at bedside.  RN reports no issues, DP not dopplerable but PT is, same with baseline.  R CFA puncture site soft and non tender.  CT head finding discussed with the patient and family by Dr. everitt Adele Erichsen.   NIR will follow.  DAPT with ASA 81 mg every day and Brilinta  90 mg BID, both give at 3:30 pm today. Next dose tomorrow 10 am. Discussed with Dr. Everitt Nile Erichsen, patient should receive Brilinta  90 mg 10 pm tonight. Ordered, RN informed via phone.   Please call IR for questions and concerns.   Chen Saadeh H Tyliek Timberman PA-C 06/05/2023 4:21 PM

## 2023-06-05 NOTE — Anesthesia Postprocedure Evaluation (Signed)
 Anesthesia Post Note  Patient: Nathan Proctor.  Procedure(s) Performed: IR WITH ANESTHESIA     Patient location during evaluation: PACU Anesthesia Type: General Level of consciousness: awake and alert Pain management: pain level controlled Vital Signs Assessment: post-procedure vital signs reviewed and stable Respiratory status: spontaneous breathing, nonlabored ventilation, respiratory function stable and patient connected to nasal cannula oxygen Cardiovascular status: blood pressure returned to baseline and stable Postop Assessment: no apparent nausea or vomiting Anesthetic complications: no   No notable events documented.  Last Vitals:  Vitals:   06/05/23 1115 06/05/23 1146  BP: 118/86 120/81  Pulse: 82 80  Resp: (!) 9 11  Temp: (!) 36.3 C (!) 36.2 C  SpO2: 92% 94%    Last Pain:  Vitals:   06/05/23 1146  TempSrc: Oral    LLE Motor Response: Purposeful movement (06/05/23 1146)   RLE Motor Response: Purposeful movement (06/05/23 1146)        Nathan Freimark A.

## 2023-06-05 NOTE — Code Documentation (Signed)
 Stroke Response Nurse Documentation Code Documentation  Nathan Proctor. is a 83 y.o. male arriving to [name]  via [facility] on 06/05/2023 with past medical hx of CKD, htn, DM, CVA. On No antithrombotic. Code stroke was activated by EMS.   Patient from home where he was LKW at 2200 last night and now complaining of R sided weakness, aphasia, facial droop. He was seen normal when he went to bed last night and was found with above deficits when his family tried to wake him up this morning.   Stroke team at the bedside on patient arrival. Labs drawn and patient cleared for CT by Dr. Ellouise. Patient to CT with team. NIHSS 28, see documentation for details and code stroke times. Patient with disoriented, not following commands, left gaze preference , right hemianopia, right facial droop, right arm weakness, bilateral leg weakness, right decreased sensation, Global aphasia , dysarthria , and right neglect on exam. The following imaging was completed:  CT Head, CTA, and CTP. Patient is not a candidate for IV Thrombolytic due to LKW 2200. Patient is a candidate for IR due to M2 occlusion on CTA.   Care Plan: taken to IR, admission to 4N for post-thrombectomy care.   Bedside handoff with ED RN Fay.    Nathan Proctor  Stroke Response RN

## 2023-06-05 NOTE — Transfer of Care (Signed)
 Immediate Anesthesia Transfer of Care Note  Patient: Nathan Proctor.  Procedure(s) Performed: IR WITH ANESTHESIA  Patient Location: PACU  Anesthesia Type:General  Level of Consciousness: awake and alert   Airway & Oxygen Therapy: Patient Spontanous Breathing and Patient connected to face mask oxygen  Post-op Assessment: Report given to RN and Post -op Vital signs reviewed and stable  Post vital signs: Reviewed and stable  Last Vitals:  Vitals Value Taken Time  BP 119/77 06/05/23 1045  Temp    Pulse 91 06/05/23 1052  Resp 28 06/05/23 1052  SpO2 96 % 06/05/23 1052  Vitals shown include unfiled device data.  Last Pain: There were no vitals filed for this visit.       Complications: No notable events documented.

## 2023-06-05 NOTE — Anesthesia Procedure Notes (Signed)
 Procedure Name: Intubation Date/Time: 06/05/2023 9:05 AM  Performed by: Virgil Ee, CRNAPre-anesthesia Checklist: Patient identified, Patient being monitored, Timeout performed, Emergency Drugs available and Suction available Patient Re-evaluated:Patient Re-evaluated prior to induction Oxygen Delivery Method: Circle system utilized Preoxygenation: Pre-oxygenation with 100% oxygen Induction Type: IV induction Ventilation: Mask ventilation without difficulty Laryngoscope Size: 4 and Glidescope Grade View: Grade I Tube type: Oral Tube size: 7.5 mm Number of attempts: 1 Airway Equipment and Method: Stylet Placement Confirmation: ETT inserted through vocal cords under direct vision, positive ETCO2 and breath sounds checked- equal and bilateral Secured at: 22 cm Tube secured with: Tape Dental Injury: Teeth and Oropharynx as per pre-operative assessment

## 2023-06-05 NOTE — Consult Note (Addendum)
 NAME:  Nathan Proctor., MRN:  [id number], DOB:  December 11, 1940, LOS: 0 ADMISSION DATE:  06/05/2023 CONSULTATION DATE:  06/05/2023 REFERRING MD:  Merrianne - Neuro, CHIEF COMPLAINT: Code Stroke, post-NIR   History of Present Illness:  83 year old man who presented to [facility] ED 2/5 via EMS for unresponsiveness and R-sided deficits. PMHx significant for HTN, HLD, prior CVA without residual deficits, T2DM, CKD stage IIIb, prostate CA, gout.  Patient presented to [facility] ED with significant R-sided deficits, aphasia and poor responsiveness. LKW 2200. Code Stroke initiated. On ED arrival, patient was afebrile with HR 86, BP 154/67, RR 20, SpO2 100%. Noted aphasia, R-sided hemiplegia, R-sided facial droop. Labs were notable for WBC 6.8. Hgb 11.9, Plt 181. INR 1.0. Na 142, K 3.8, CO2 20, Cr 1.59 (baseline), LFTs WNL. Ethanol < 10. COVID negative. CT Head with acute L MCA territory infarct affecting L insula/L frontal operculum, small chronic cortically-based infarcts of L frontal/L parietal/L occipital lobes. CTA Head/Neck with abrupt occlusion of proximal M2 L MCA vessel, ?2mm periophthalmic L ICA aneurysm, bilateral ICA plaque with progressive atherosclerotic plaque L carotid bifurcation with proximal L ICA severe, near-occlusive stenosis.   Taken to [facility] emergently for mechanical thrombectomy (de Nile Erichsen). TICI 3 revascularization achieved. L ICA stenosis and plaque rupture noted at the bulb, treated with stenting/angioplasty. Extubated post-procedure and maintained on cangrelor  gtt with plan for transition to DAPT.  PCCM consulted for post-procedure medical management.  Pertinent Medical History:   Past Medical History:  Diagnosis Date   Anemia of chronic disease    Ankle fracture 02/15/2016   Cancer (HCC)    Prostate   Chronic constipation    Chronic kidney disease    stage III   Diabetes mellitus without complication (HCC)    type II    Diabetic peripheral neuropathy (HCC)    Failure to  thrive (0-17)    Fracture of left lower leg    Gout    Hyperlipidemia    Hypertension    Morbid obesity (HCC)    Unstable gait    Significant Hospital Events: Including procedures, antibiotic start and stop dates in addition to other pertinent events   2/5 - Presented to [facility] ED with poor responsiveness, aphasia, R-sided deficits. CT Head with L MCA territory infarct. CTA Head/Neck with occlusion of L MCA M2 segment and severe near-occlusive stenosis of L ICA. Taken to [facility] for MT, stent and angioplasty (de Nile Erichsen). PCCM consulted for post-procedure management.  Interim History / Subjective:  PCCM consulted for post-procedure medical management.  Objective:  Blood pressure (!) 154/67, pulse 86, resp. rate 20, weight 132.6 kg, SpO2 100%.       No intake or output data in the 24 hours ending 06/05/23 1013 Filed Weights   06/05/23 0800  Weight: 132.6 kg   Physical Examination: General: Acutely ill-appearing elderly man in NAD. HEENT: [place]/AT, anicteric sclera, PERRL 2mm, moist mucous membranes. Persistent mild R facial droop. Neuro:  Awake, alert. 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  Responds to verbal stimuli. Following commands intermittently. Moving all 4 extremities spontaneously, improved movement of RUE/RLE from prior. CV: RRR, no m/g/r. PULM: Breathing even and unlabored on RA. Lung fields CTAB. GI: Soft, nontender, nondistended. Normoactive bowel sounds. Extremities: No significant LE edema noted. Skin: Warm/dry, no rashes.  Resolved Hospital Problem List:    Assessment & Plan:  Left MCA M2 occlusion L ICA near-occlusive stenosis CT Head with acute L MCA territory  infarct affecting L insula/L frontal operculum, small chronic cortically-based infarcts of L frontal/L parietal/L occipital lobes. CTA Head/Neck with abrupt occlusion of proximal M2 L MCA vessel, ?2mm periophthalmic L ICA aneurysm,  bilateral ICA plaque with progressive atherosclerotic plaque L carotid bifurcation with proximal L ICA severe, near-occlusive stenosis. - Stroke team primary - S/p NIR mechanical thrombectomy/stent and angioplasty - Goal SBP 120-160 - Repeat CT Head per NIR - Cangrelor  gtt for now, transition to DAPT with ASA/Brilinta  after CT Head - F/u Echo, lipid panel, A1c - Frequent neuro checks - Neuroprotective measures: HOB > 30 degrees, normoglycemia, normothermia, electrolytes WNL - PT/OT/SLP when able to participate in care - Cortrak for enteral access for now  HTN HLD - Cangrelor , eventual DAPT with Brilinta /ASA - Resume home antihypertensives as clinically appropriate (Norvasc , Coreg , Cozaar ) - Resume statin  T2DM - SSI - CBGs Q4H, ACHS when tolerating PO - Goal CBG 140-180 - Hold home Jardiance  for now  CKD stage IIIb - Trend BMP - Replete electrolytes as indicated - Monitor I&Os - Avoid nephrotoxic agents as able - Ensure adequate renal perfusion  Best Practice: (right click and Reselect all SmartList Selections daily)   Per Primary Team  Labs:  CBC: Recent Labs  Lab 06/05/23 0815 06/05/23 0817  WBC 6.8  --   NEUTROABS 3.1  --   HGB 11.9* 12.6*  HCT 37.8* 37.0*  MCV 97.4  --   PLT 181  --    Basic Metabolic Panel: Recent Labs  Lab 06/05/23 0815 06/05/23 0817  NA 142 142  K 3.8 3.7  CL 109 108  CO2 20*  --   GLUCOSE 203* 201*  BUN 18 21  CREATININE 1.59* 1.60*  CALCIUM  9.4  --    GFR: Estimated Creatinine Clearance: 47.9 mL/min (A) (by C-G formula based on SCr of 1.6 mg/dL (H)). Recent Labs  Lab 06/05/23 0815  WBC 6.8   Liver Function Tests: Recent Labs  Lab 06/05/23 0815  AST 20  ALT 13  ALKPHOS 76  BILITOT 1.2  PROT 6.5  ALBUMIN  3.6   No results for input(s): LIPASE, AMYLASE in the last 168 hours. No results for input(s): AMMONIA in the last 168 hours.  ABG:    Component Value Date/Time   TCO2 25 06/05/2023 0817     Coagulation Profile: Recent Labs  Lab 06/05/23 0815  INR 1.0   Cardiac Enzymes: No results for input(s): CKTOTAL, CKMB, CKMBINDEX, TROPONINI in the last 168 hours.  HbA1C: Hemoglobin A1C  Date/Time Value Ref Range Status  02/21/2016 12:00 AM 6.5  Final   Hgb A1c MFr Bld  Date/Time Value Ref Range Status  10/04/2019 05:31 AM 5.6 4.8 - 5.6 % Final    Comment:    (NOTE) Pre diabetes:          5.7%-6.4% Diabetes:              >6.4% Glycemic control for   <7.0% adults with diabetes    CBG: Recent Labs  Lab 06/05/23 0812  GLUCAP 195*   Review of Systems:   Patient is encephalopathic and/or intubated; therefore, history has been obtained from chart review.   Past Medical History:  He,  has a past medical history of Anemia of chronic disease, Ankle fracture (02/15/2016), Cancer (HCC), Chronic constipation, Chronic kidney disease, Diabetes mellitus without complication (HCC), Diabetic peripheral neuropathy (HCC), Failure to thrive (0-17), Fracture of left lower leg, Gout, Hyperlipidemia, Hypertension, Morbid obesity (HCC), and Unstable gait.   Surgical History:  Past Surgical History:  Procedure Laterality Date   ORIF ANKLE FRACTURE Left 02/29/2016   Procedure: OPEN REDUCTION INTERNAL FIXATION (ORIF) ANKLE FRACTURE;  Surgeon: Selinda Belvie Gosling, MD;  Location: [facility]S;  Service: Orthopedics;  Laterality: Left;   PROSTATE SURGERY      Social History:   reports that he quit smoking about 46 years ago. His smoking use included cigarettes. He has never used smokeless tobacco. He reports that he does not currently use alcohol. He reports that he does not use drugs.   Family History:  His family history includes Heart Problems in his father and sister.   Allergies: No Known Allergies   Home Medications: Prior to Admission medications   Medication Sig Start Date End Date Taking? Authorizing Provider  acetaminophen  (TYLENOL ) 325 MG tablet Take 1-2 tablets (325-650  mg total) by mouth every 4 (four) hours as needed for mild pain. 10/14/19   Love, Sharlet RAMAN, PA-C  allopurinol  (ZYLOPRIM ) 100 MG tablet Take 100 mg by mouth daily.  01/25/16   [provider]  amLODipine  (NORVASC ) 2.5 MG tablet 10 mg daily.  11/12/19   [provider]  aspirin  EC 81 MG EC tablet Take 1 tablet (81 mg total) by mouth daily. 10/08/19   Danford, Lonni SQUIBB, MD  atorvastatin  (LIPITOR ) 80 MG tablet Take 1 tablet (80 mg total) by mouth daily. 10/14/19   Love, Sharlet RAMAN, PA-C  benzonatate  (TESSALON ) 100 MG capsule Take 1 capsule (100 mg total) by mouth every 8 (eight) hours as needed for cough. 04/01/22   Hazen Darryle BRAVO, FNP  carvedilol  (COREG ) 25 MG tablet TAKE 1 TABLET BY MOUTH TWICE DAILY WITH A MEAL 09/05/20   Lesia Ozell Barter, PA-C  clopidogrel  (PLAVIX ) 75 MG tablet Take 1 tablet (75 mg total) by mouth daily. 10/14/19   Love, Sharlet RAMAN, PA-C  diclofenac Sodium (VOLTAREN) 1 % GEL  11/26/19   [provider]  fluticasone  (FLONASE ) 50 MCG/ACT nasal spray Place 1 spray into both nostrils daily. 04/01/22   Hazen Darryle BRAVO, FNP  gabapentin  (NEURONTIN ) 300 MG capsule Take 300 mg by mouth 3 (three) times daily. 02/05/22   [provider]  insulin  NPH-regular Human (70-30) 100 UNIT/ML injection Inject 5 Units into the skin daily. 10/14/19   Love, Sharlet RAMAN, PA-C  JARDIANCE  10 MG TABS tablet Take 10 mg by mouth daily. 03/31/22   [provider]  losartan  (COZAAR ) 25 MG tablet Take 1 tablet (25 mg total) by mouth daily. 12/23/19 03/22/20  Lesia Ozell Barter, PA-C  meclizine  (ANTIVERT ) 25 MG tablet Take 1 tablet (25 mg total) by mouth 2 (two) times daily as needed for dizziness. 01/07/23   Midge Golas, MD  Vitamin D, Ergocalciferol, (DRISDOL) 50000 units CAPS capsule Take 50,000 Units by mouth every 7 (seven) days.     [provider]    Critical care time: N/A   Corean CHRISTELLA Ilah DEVONNA [facility] 06/05/23 10:13  AM  Please see [email or web address] for pager details.  From 7A-7P if no response, please call [phone] After hours, please call ELink [phone]

## 2023-06-05 NOTE — H&P (Signed)
 NEUROLOGY H&P NOTE   Date of service: June 05, 2023 Patient Name: Nathan Proctor. MRN:  [id number] DOB:  [birth year] Chief Complaint: CODE STROKE  History of Present Illness  Yoshiharu Brassell. is a 83 y.o. male with hx of hypertension, diabetes, CKD, prior CVA with no residual deficits who was brought in by EMS as a code stroke due to acute onset of right-sided weakness, right facial droop and aphasia. Patient was last seen normal before bed at 2200 last night, was found in this state when family tried to wake him up this morning.  On exam at bridge, patient exhibits global aphasia, right-sided facial droop, right hemiplegia, right-sided sensory deficit, decreased field of vision, left gaze preference but does occasionally cross midline. CTH shows acute left MCA territory infarct with aspects of 7, known small chronic left frontal left parietal and left occipital infarcts.  CTA shows abrupt occlusion of left M2 proximallly, perfusion shows 41 mm core infarct with mismatch of 45 mL. Case discussed with Dr. De Macedo Rodrigues.  Risk and benefits of thrombectomy procedure were then discussed with wife Dr. Gregorio Trudy who consented for mechanical thrombectomy.  Patient was taken emergently to IR for procedure.   Last known well: 2200 2/4 Modified rankin score: 0-Completely asymptomatic and back to baseline post- stroke IV Thrombolysis: No, outside of window Thrombectomy: Yes, taken to IR for mechanical thrombectomy of proximal left M2  NIHSS components Score: Comment  1a Level of Conscious 0[x]  1[]  2[]  3[]      1b LOC Questions 0[]  1[]  2[x]       1c LOC Commands 0[]  1[]  2[x]       2 Best Gaze 0[]  1[x]  2[]       3 Visual 0[]  1[]  2[x]  3[]      4 Facial Palsy 0[]  1[x]  2[]  3[]      5a Motor Arm - left 0[x]  1[]  2[]  3[]  4[]  UN[]    5b Motor Arm - Right 0[]  1[]  2[]  3[]  4[x]  UN[]    6a Motor Leg - Left 0[]  1[]  2[]  3[x]  4[]  UN[]    6b Motor Leg - Right 0[]  1[]  2[]  3[]  4[x]  UN[]    7 Limb  Ataxia 0[x]  1[]  2[]  3[]  UN[]     8 Sensory 0[]  1[]  2[x]  UN[]      9 Best Language 0[]  1[]  2[]  3[x]      10 Dysarthria 0[]  1[]  2[x]  UN[]      11 Extinct. and Inattention 0[]  1[]  2[x]       TOTAL:   28      ROS   Unable to perform due to AMS  Past History   Past Medical History:  Diagnosis Date   Anemia of chronic disease    Ankle fracture 02/15/2016   Cancer (HCC)    Prostate   Chronic constipation    Chronic kidney disease    stage III   Diabetes mellitus without complication (HCC)    type II    Diabetic peripheral neuropathy (HCC)    Failure to thrive (0-17)    Fracture of left lower leg    Gout    Hyperlipidemia    Hypertension    Morbid obesity (HCC)    Unstable gait    Past Surgical History:  Procedure Laterality Date   ORIF ANKLE FRACTURE Left 02/29/2016   Procedure: OPEN REDUCTION INTERNAL FIXATION (ORIF) ANKLE FRACTURE;  Surgeon: Selinda Belvie Gosling, MD;  Location: [facility];  Service: Orthopedics;  Laterality: Left;   PROSTATE SURGERY     Family History  Problem Relation  Age of Onset   Heart Problems Father    Heart Problems Sister    Social History   Socioeconomic History   Marital status: Married    Spouse name: Not on file   Number of children: Not on file   Years of education: Not on file   Highest education level: Not on file  Occupational History   Not on file  Tobacco Use   Smoking status: Former    Current packs/day: 0.00    Types: Cigarettes    Quit date: 92    Years since quitting: 46.1   Smokeless tobacco: Never  Vaping Use   Vaping status: Former  Substance and Sexual Activity   Alcohol use: Not Currently    Comment: Occasionally   Drug use: No   Sexual activity: Yes  Other Topics Concern   Not on file  Social History Narrative   Not on file   Social Drivers of Health   Financial Resource Strain: Not on file  Food Insecurity: Not on file  Transportation Needs: Not on file  Physical Activity: Not on file  Stress: Not on file   Social Connections: Not on file   No Known Allergies  Medications   Current Facility-Administered Medications:    [START ON 06/06/2023]  stroke: early stages of recovery book, , Does not apply, Once, [place], Erin C, NP   0.9 %  sodium chloride  infusion, , Intravenous, Continuous, Lehner, Erin C, NP   acetaminophen  (TYLENOL ) tablet 650 mg, 650 mg, Oral, Q4H PRN **OR** acetaminophen  (TYLENOL ) 160 MG/5ML solution 650 mg, 650 mg, Per Tube, Q4H PRN **OR** acetaminophen  (TYLENOL ) suppository 650 mg, 650 mg, Rectal, Q4H PRN, Lehner, Erin C, NP   labetalol  (NORMODYNE ) injection 20 mg, 20 mg, Intravenous, Once **AND** clevidipine  (CLEVIPREX ) infusion 0.5 mg/mL, 0-21 mg/hr, Intravenous, Continuous, Lehner, Erin C, NP   pantoprazole  (PROTONIX ) injection 40 mg, 40 mg, Intravenous, QHS, Lehner, Erin C, NP   senna-docusate (Senokot-S) tablet 1 tablet, 1 tablet, Oral, QHS PRN, Lehner, Erin C, NP   sodium chloride  flush (NS) 0.9 % injection 3 mL, 3 mL, Intravenous, Once, Kingsley, Victoria K, DO  Current Outpatient Medications:    acetaminophen  (TYLENOL ) 325 MG tablet, Take 1-2 tablets (325-650 mg total) by mouth every 4 (four) hours as needed for mild pain., Disp: , Rfl:    allopurinol  (ZYLOPRIM ) 100 MG tablet, Take 100 mg by mouth daily. , Disp: , Rfl:    amLODipine  (NORVASC ) 2.5 MG tablet, 10 mg daily. , Disp: , Rfl:    aspirin  EC 81 MG EC tablet, Take 1 tablet (81 mg total) by mouth daily., Disp:  , Rfl:    atorvastatin  (LIPITOR ) 80 MG tablet, Take 1 tablet (80 mg total) by mouth daily., Disp: 30 tablet, Rfl: 0   benzonatate  (TESSALON ) 100 MG capsule, Take 1 capsule (100 mg total) by mouth every 8 (eight) hours as needed for cough., Disp: 21 capsule, Rfl: 0   carvedilol  (COREG ) 25 MG tablet, TAKE 1 TABLET BY MOUTH TWICE DAILY WITH A MEAL, Disp: 60 tablet, Rfl: 0   clopidogrel  (PLAVIX ) 75 MG tablet, Take 1 tablet (75 mg total) by mouth daily., Disp: 10 tablet, Rfl: 0   diclofenac Sodium (VOLTAREN) 1 %  GEL, , Disp: , Rfl:    fluticasone  (FLONASE ) 50 MCG/ACT nasal spray, Place 1 spray into both nostrils daily., Disp: 16 g, Rfl: 0   gabapentin  (NEURONTIN ) 300 MG capsule, Take 300 mg by mouth 3 (three) times daily., Disp: , Rfl:  insulin  NPH-regular Human (70-30) 100 UNIT/ML injection, Inject 5 Units into the skin daily., Disp: 10 mL, Rfl: 11   JARDIANCE  10 MG TABS tablet, Take 10 mg by mouth daily., Disp: , Rfl:    losartan  (COZAAR ) 25 MG tablet, Take 1 tablet (25 mg total) by mouth daily., Disp: 90 tablet, Rfl: 3   meclizine  (ANTIVERT ) 25 MG tablet, Take 1 tablet (25 mg total) by mouth 2 (two) times daily as needed for dizziness., Disp: 30 tablet, Rfl: 0   Vitamin D, Ergocalciferol, (DRISDOL) 50000 units CAPS capsule, Take 50,000 Units by mouth every 7 (seven) days. , Disp: , Rfl:   Facility-Administered Medications Ordered in Other Encounters:    fentaNYL  citrate (PF) (SUBLIMAZE ) injection, , Intravenous, Anesthesia Intra-op, [place], Rose, CRNA, 50 mcg at 06/05/23 9087   lidocaine  2% (20 mg/mL) 5 mL syringe, , Intravenous, Anesthesia Intra-op, [place], Rose, CRNA, 100 mg at 06/05/23 9095   propofol  (DIPRIVAN ) 10 mg/mL bolus/IV push, , Intravenous, Anesthesia Intra-op, [place], Rose, CRNA, 100 mg at 06/05/23 9095   rocuronium  (ZEMURON ) injection, , Intravenous, Anesthesia Intra-op, [place], Rose, CRNA, 80 mg at 06/05/23 0912   Vitals   Vitals:   06/05/23 0800 06/05/23 0810  BP:  (!) 154/67  Pulse:  86  Resp:  20  SpO2:  100%  Weight: 132.6 kg      Body mass index is 41.94 kg/m.  Physical Exam   Constitutional: Appears well-developed and well-nourished.  Eyes: No scleral injection.  HENT: No OP obstruction.  Head: Normocephalic.  Cardiovascular: Normal rate and regular rhythm.  Respiratory: Effort normal, non-labored breathing.  GI: Soft.  No distension. There is no tenderness.  Skin: WDI.   Neurologic Examination   Mental Status: Patient is alert, no verbal responses to  orientation questions.  Global aphasia present.  Does not follow commands.  Does not answer questions. Will smile at wife when she speaks to him.  Cranial Nerves: II: PERRL.  No blink to threat bilaterally. III,IV, VI: Left gaze preference.  Does intermittently cross midline V: Unable to assess VII: Right facial droop VIII: hearing is intact to voice X: Uvula elevates symmetrically XI: Unable to assess XII: Unable to assess Motor: RUE: 0/5, no effort against gravity RLE: 1/5, slight effort against gravity, increased tone LUE: 5/5, no drift LLE: 3/5, no effort against resistance, increased tone Sensory: No withdrawal to noxious stimuli on right arm or leg. Cerebellar/Gait: Unable to assess   Labs   CBC:  Recent Labs  Lab 06/05/23 0815 06/05/23 0817  WBC 6.8  --   NEUTROABS 3.1  --   HGB 11.9* 12.6*  HCT 37.8* 37.0*  MCV 97.4  --   PLT 181  --    Basic Metabolic Panel:  Lab Results  Component Value Date   NA 142 06/05/2023   K 3.7 06/05/2023   CO2 25 01/06/2023   GLUCOSE 201 (H) 06/05/2023   BUN 21 06/05/2023   CREATININE 1.60 (H) 06/05/2023   CALCIUM  9.3 01/06/2023   GFRNONAA 46 (L) 01/06/2023   GFRAA 52 (L) 12/08/2019   Lipid Panel:  Lab Results  Component Value Date   LDLCALC 223 (H) 05/31/2021   HgbA1c:  Lab Results  Component Value Date   HGBA1C 5.6 10/04/2019   Urine Drug Screen: No results found for: LABOPIA, COCAINSCRNUR, LABBENZ, AMPHETMU, THCU, LABBARB  Alcohol Level No results found for: [facility] INR  Lab Results  Component Value Date   INR 1.0 06/05/2023   APTT  Lab Results  Component Value Date   APTT 29 10/04/2019    CT Head without contrast (Personally reviewed): Acute left MCA territory infarct affecting the left insula and portions of the left frontal operculum ASPECTS 7 Known small chronic cortically-based infarcts within the left frontal, left parietal and left occipital lobe Background mild cerebral white matter  chronic small vessel ischemic disease Subcentimeter infarct within the right cerebellar hemisphere, new from prior MRI but chronic in appearance  CT angio Head and Neck with contrast(Personally reviewed): Abrupt occlusion of a proximal M2 left middle cerebral artery vessel.  The common carotid and internal carotid arteries are patent within the neck. Atherosclerotic plaque bilaterally. Most notably, there is progressive atherosclerotic plaque about the left carotid bifurcation and within the proximal left ICA with resultant severe near occlusive stenosis of the proximal left ICA. Also of note, atherosclerotic plaque about the right carotid bifurcation results in a 40% stenosis at the right ICA origin. Possible 2 mm periophthalmic left ICA aneurysm  The vertebral arteries are patent within the neck. Atherosclerotic plaque bilaterally as described. Most notably, there is suspected at least moderate stenoses at the bilateral vertebral artery origins.  CT Perfusion (Personally reviewed): 41 mL core infarct in the left MCA vascular territory 86 mL region of critically hypoperfused parenchyma within the left MCA vascular territory Reported mismatch volume: 45 mL   MRI Brain: PENDING  Assessment  Jihan Rudy. is a 83 y.o. male with hx of hypertension, diabetes, CKD, prior CVA with no residual deficits who was brought in by EMS as a code stroke due to acute onset of right-sided weakness, right facial droop and aphasia. Patient was last seen normal before bed at 2200 last night, was found in this state when family tried to wake him up this morning.  - On exam, the patient exhibits global aphasia, right-sided facial droop, right hemiplegia, right-sided sensory deficit, decreased field of vision, left gaze preference but does occasionally cross midline. NIH on  admission: 28. - CTH shows acute left MCA territory infarct with aspects of 7, known small chronic left frontal left parietal and left  occipital infarcts.  CTA shows abrupt occlusion of left M2 proximal, perfusion shows 41 mm core infarct with mismatch of 45 mL.  - Not a TNK candidate due to time critieria - The patient is a VIR candidate. The patient is unable to provide informed consent due to his aphasia. Risks/benefits of the procedure were discussed extensively with the patient's wife, including approximately 50% chance of significant improvement relative to an approximate 10% chance of subarachnoid hemorrhage with possibility of significant worsening including death. The patient's wife expressed understanding and provided informed consent to proceed with VIR. All questions answered.   - Taken to IR for emergent thrombectomy.   Primary Diagnosis:  Cerebral infarction due to occlusion or stenosis of left middle cerebral artery.   Secondary Diagnoses: Essential (primary) hypertension, Type 2 diabetes mellitus w/o complications, Morbid Obesity(BMI > 40), and CKD Stage 3 (GFR 30-59)  Recommendations  - Frequent Neuro checks per stroke unit protocol - MRI Brain stroke protocol - TTE - Lipid panel - Statin - will be started if LDL>70 or otherwise medically indicated - A1C - Antithrombotic - per IR - DVT ppx - SCDS - SBP goal - 120-160 for 1st 24 hours, then <180 UNLESS changed by IR - Telemetry monitoring for arrhythmia - 72h - Swallow screen - will be performed prior to PO intake - Stroke education - will be given - PT/OT/SLP -  Dispo: ICU ______________________________________________________________________  Pt seen by Neuro NP/APP and MD.   Signed, Rocky JAYSON Likes, DNP, AGACNP-BC [facility] Please use AMION for contact information & EPIC for messaging.  I have seen and examined the patient. I have formulated the assessment and plan. 83 year old male with acute left M2 occlusion. Exam with dense aphasia and right sided UMN deficits. Informed consent for IR obtained from wife at the bedside. Detailed  plan as documented above.  Electronically signed: Dr. Sanye Ledesma

## 2023-06-05 NOTE — ED Triage Notes (Signed)
 [facility] reports pt coming from  home. Wife went to wake up pt and he was unable to respond or get out of bed. Pt LSN at 2200. Pt w/hx of previous stroke with no deficits. Pt flaccid on the right side with EMS unable to speak.

## 2023-06-05 NOTE — Anesthesia Preprocedure Evaluation (Signed)
 Anesthesia Evaluation  Patient identified by MRN, date of birth, ID band Patient awake    Reviewed: Allergy & Precautions, Patient's Chart, lab work & pertinent test results, Unable to perform ROS - Chart review onlyPreop documentation limited or incomplete due to emergent nature of procedure.  Airway Mallampati: III       Dental no notable dental hx.    Pulmonary former smoker   Pulmonary exam normal breath sounds clear to auscultation       Cardiovascular hypertension, Pt. on medications and Pt. on home beta blockers +CHF  Normal cardiovascular exam Rhythm:Regular Rate:Normal     Neuro/Psych Peripheral neuropathy Code stroke  Right facial droop Right hemiplegia  Neuromuscular disease CVA, Residual Symptoms  negative psych ROS   GI/Hepatic negative GI ROS, Neg liver ROS,,,  Endo/Other  diabetes, Well Controlled, Type 2, Oral Hypoglycemic Agents, Insulin  Dependent  Class 3 obesityHyperlipidemia Gout  Renal/GU Renal InsufficiencyRenal disease     Musculoskeletal   Abdominal  (+) + obese  Peds  Hematology  (+) Blood dyscrasia, anemia Plavix  therapy   Anesthesia Other Findings   Reproductive/Obstetrics                              Anesthesia Physical Anesthesia Plan  ASA: 4 and emergent  Anesthesia Plan: General   Post-op Pain Management: Minimal or no pain anticipated   Induction: Intravenous, Cricoid pressure planned and Rapid sequence  PONV Risk Score and Plan: 3 and Treatment may vary due to age or medical condition, Ondansetron  and Dexamethasone   Airway Management Planned: Oral ETT  Additional Equipment: None  Intra-op Plan:   Post-operative Plan: Extubation in OR  Informed Consent: I have reviewed the patients History and Physical, chart, labs and discussed the procedure including the risks, benefits and alternatives for the proposed anesthesia with the patient or  authorized representative who has indicated his/her understanding and acceptance.       Plan Discussed with: CRNA and Anesthesiologist  Anesthesia Plan Comments:          Anesthesia Quick Evaluation

## 2023-06-06 ENCOUNTER — Ambulatory Visit: Payer: Medicare HMO | Admitting: Podiatry

## 2023-06-06 ENCOUNTER — Telehealth (HOSPITAL_COMMUNITY): Payer: Self-pay | Admitting: Pharmacy Technician

## 2023-06-06 ENCOUNTER — Other Ambulatory Visit (HOSPITAL_COMMUNITY): Payer: Self-pay

## 2023-06-06 ENCOUNTER — Other Ambulatory Visit: Payer: Self-pay

## 2023-06-06 ENCOUNTER — Inpatient Hospital Stay (HOSPITAL_COMMUNITY): Payer: No Typology Code available for payment source

## 2023-06-06 ENCOUNTER — Encounter (HOSPITAL_COMMUNITY): Payer: Self-pay | Admitting: Radiology

## 2023-06-06 DIAGNOSIS — I639 Cerebral infarction, unspecified: Secondary | ICD-10-CM | POA: Diagnosis not present

## 2023-06-06 DIAGNOSIS — E440 Moderate protein-calorie malnutrition: Secondary | ICD-10-CM | POA: Insufficient documentation

## 2023-06-06 DIAGNOSIS — I5023 Acute on chronic systolic (congestive) heart failure: Secondary | ICD-10-CM | POA: Diagnosis not present

## 2023-06-06 LAB — CBC
HCT: 36.3 % — ABNORMAL LOW (ref 39.0–52.0)
Hemoglobin: 11.4 g/dL — ABNORMAL LOW (ref 13.0–17.0)
MCH: 30.6 pg (ref 26.0–34.0)
MCHC: 31.4 g/dL (ref 30.0–36.0)
MCV: 97.6 fL (ref 80.0–100.0)
Platelets: 177 10*3/uL (ref 150–400)
RBC: 3.72 MIL/uL — ABNORMAL LOW (ref 4.22–5.81)
RDW: 13.8 % (ref 11.5–15.5)
WBC: 10 10*3/uL (ref 4.0–10.5)
nRBC: 0 % (ref 0.0–0.2)

## 2023-06-06 LAB — LIPID PANEL
Cholesterol: 183 mg/dL (ref 0–200)
HDL: 60 mg/dL (ref >40)
LDL Cholesterol: 107 mg/dL — ABNORMAL HIGH (ref 0–99)
Total CHOL/HDL Ratio: 3.1 {ratio}
Triglycerides: 81 mg/dL (ref <150)
VLDL: 16 mg/dL (ref 0–40)

## 2023-06-06 LAB — COMPREHENSIVE METABOLIC PANEL
ALT: 12 U/L (ref 0–44)
AST: 26 U/L (ref 15–41)
Albumin: 3.3 g/dL — ABNORMAL LOW (ref 3.5–5.0)
Alkaline Phosphatase: 72 U/L (ref 38–126)
Anion gap: 13 (ref 5–15)
BUN: 23 mg/dL (ref 8–23)
CO2: 16 mmol/L — ABNORMAL LOW (ref 22–32)
Calcium: 8.7 mg/dL — ABNORMAL LOW (ref 8.9–10.3)
Chloride: 110 mmol/L (ref 98–111)
Creatinine, Ser: 1.66 mg/dL — ABNORMAL HIGH (ref 0.61–1.24)
GFR, Estimated: 41 mL/min — ABNORMAL LOW (ref >60)
Glucose, Bld: 161 mg/dL — ABNORMAL HIGH (ref 70–99)
Potassium: 5 mmol/L (ref 3.5–5.1)
Sodium: 139 mmol/L (ref 135–145)
Total Bilirubin: 1.1 mg/dL (ref 0.0–1.2)
Total Protein: 6.1 g/dL — ABNORMAL LOW (ref 6.5–8.1)

## 2023-06-06 LAB — PROTIME-INR
INR: 1.3 — ABNORMAL HIGH (ref 0.8–1.2)
Prothrombin Time: 16.2 s — ABNORMAL HIGH (ref 11.4–15.2)

## 2023-06-06 LAB — MAGNESIUM
Magnesium: 2.3 mg/dL (ref 1.7–2.4)
Magnesium: 2.2 mg/dL (ref 1.7–2.4)

## 2023-06-06 LAB — PHOSPHORUS
Phosphorus: 3 mg/dL (ref 2.5–4.6)
Phosphorus: 3.4 mg/dL (ref 2.5–4.6)

## 2023-06-06 LAB — GLUCOSE, CAPILLARY
Glucose-Capillary: 137 mg/dL — ABNORMAL HIGH (ref 70–99)
Glucose-Capillary: 139 mg/dL — ABNORMAL HIGH (ref 70–99)
Glucose-Capillary: 144 mg/dL — ABNORMAL HIGH (ref 70–99)
Glucose-Capillary: 154 mg/dL — ABNORMAL HIGH (ref 70–99)
Glucose-Capillary: 165 mg/dL — ABNORMAL HIGH (ref 70–99)
Glucose-Capillary: 95 mg/dL (ref 70–99)

## 2023-06-06 MED ORDER — CLOPIDOGREL BISULFATE 75 MG PO TABS
75.0000 mg | ORAL_TABLET | Freq: Every day | ORAL | Status: DC
  Filled 2023-06-06: qty 1

## 2023-06-06 MED ORDER — CLOPIDOGREL BISULFATE 75 MG PO TABS
75.0000 mg | ORAL_TABLET | Freq: Every day | ORAL | Status: DC
  Administered 2023-06-06 – 2023-06-11 (×6): 75 mg
  Filled 2023-06-06 (×6): qty 1

## 2023-06-06 MED ORDER — EMPAGLIFLOZIN 10 MG PO TABS
10.0000 mg | ORAL_TABLET | Freq: Every day | ORAL | Status: DC
  Filled 2023-06-06: qty 1

## 2023-06-06 MED ORDER — METOPROLOL SUCCINATE ER 25 MG PO TB24
25.0000 mg | ORAL_TABLET | Freq: Every day | ORAL | Status: DC
  Filled 2023-06-06: qty 1

## 2023-06-06 MED ORDER — ATORVASTATIN CALCIUM 80 MG PO TABS
80.0000 mg | ORAL_TABLET | Freq: Every day | ORAL | Status: DC
  Administered 2023-06-06 – 2023-06-11 (×6): 80 mg
  Filled 2023-06-06 (×6): qty 1

## 2023-06-06 MED ORDER — METOPROLOL TARTRATE 12.5 MG HALF TABLET
12.5000 mg | ORAL_TABLET | Freq: Two times a day (BID) | ORAL | Status: DC
  Administered 2023-06-06 – 2023-06-11 (×10): 12.5 mg
  Filled 2023-06-06 (×10): qty 1

## 2023-06-06 MED ORDER — EZETIMIBE 10 MG PO TABS
10.0000 mg | ORAL_TABLET | Freq: Every day | ORAL | Status: DC
  Administered 2023-06-07 – 2023-06-11 (×5): 10 mg
  Filled 2023-06-06 (×5): qty 1

## 2023-06-06 MED ORDER — HEPARIN SODIUM (PORCINE) 5000 UNIT/ML IJ SOLN
5000.0000 [IU] | Freq: Three times a day (TID) | INTRAMUSCULAR | Status: DC
Start: 2023-06-06 — End: 2023-06-15
  Administered 2023-06-06 – 2023-06-14 (×25): 5000 [IU] via SUBCUTANEOUS
  Filled 2023-06-06 (×24): qty 1

## 2023-06-06 MED ORDER — CHLORHEXIDINE GLUCONATE CLOTH 2 % EX PADS
6.0000 | MEDICATED_PAD | Freq: Every day | CUTANEOUS | Status: DC
  Administered 2023-06-06 – 2023-06-09 (×4): 6 via TOPICAL

## 2023-06-06 MED ORDER — OSMOLITE 1.5 CAL PO LIQD
1000.0000 mL | ORAL | Status: DC
  Administered 2023-06-06 – 2023-06-09 (×6): 1000 mL
  Filled 2023-06-06: qty 1000

## 2023-06-06 NOTE — Telephone Encounter (Signed)
 Pharmacy Patient Advocate Encounter  Insurance verification completed.    The patient is insured through [employer]. Patient has Medicare and is not eligible for a copay card, but may be able to apply for patient assistance or Medicare RX Payment Plan (Patient Must reach out to their plan, if eligible for payment plan), if available.    Ran test claim for BRILINTA  90MG  and the current 30 day co-pay is $433.65.  Copay applied to deductible. Total patient deductible is $590.00 and still not met.   This test claim was processed through [facility]- copay amounts may vary at other pharmacies due to pharmacy/plan contracts, or as the patient moves through the different stages of their insurance plan.

## 2023-06-06 NOTE — Evaluation (Signed)
 Occupational Therapy Evaluation Patient Details Name: Nathan Proctor. MRN: [id number] DOB: 1940-12-17 Today's Date: 06/06/2023   History of Present Illness 83 y.o. male who was brought in by EMS 06/05/23 as a code stroke due to acute onset of right-sided weakness, right facial droop and aphasia. CTH shows acute left MCA territory infarct; NIHSS 28; to IR for thrombectomy/stenting/angioplasty; PMH hypertension, diabetes, CKD, prior CVA with no residual deficits   Clinical Impression   Pt currently at min assist level for simulated toilet transfers with use of the RW for support.  Mod assist for simulated selfcare sit to stand.  Noted expressive and receptive deficits with questionable motor planning when attempting to understand and follow basic commands as well as for vision testing and MMT.  Pt not oriented to place, time, or situation when asked and unable to pick from choices given.  Prior to event pt lived with his spouse and she would assist occasionally with ADLs but he was able to complete most dressing without assist.  Feel he will benefit from acute care OT at this time in order to progress independence level with selfcare tasks to a level that's safe for return home.  Recommend continued post acute OT via inpatient follow-up therapy, >3 hours/day to maximize gains and reach supervision level or better.        If plan is discharge home, recommend the following: A little help with walking and/or transfers;A little help with bathing/dressing/bathroom;Assistance with cooking/housework;Direct supervision/assist for financial management;Supervision due to cognitive status;Assist for transportation;Direct supervision/assist for medications management;Help with stairs or ramp for entrance    Functional Status Assessment  Patient has had a recent decline in their functional status and demonstrates the ability to make significant improvements in function in a reasonable and predictable amount  of time.  Equipment Recommendations  Other (comment) (TBD next venue of care)    Recommendations for Other Services Rehab consult     Precautions / Restrictions Precautions Precautions: Fall Restrictions Weight Bearing Restrictions Per Provider Order: No      Mobility Bed Mobility                    Transfers Overall transfer level: Needs assistance Equipment used: Rolling walker (2 wheels) Transfers: Sit to/from Stand, Bed to chair/wheelchair/BSC Sit to Stand: Min assist     Step pivot transfers: Min assist     General transfer comment: Mod demonstrational cueing for hand placement during sit to stand from the recliner as pt wants to pull up on the RW instead.      Balance Overall balance assessment: Needs assistance Sitting-balance support: Feet supported Sitting balance-Leahy Scale: Fair Sitting balance - Comments: Pt able to maintain sitting balance without support statically but needs supervision when bending down to donn gripper socks   Standing balance support: Reliant on assistive device for balance Standing balance-Leahy Scale: Poor Standing balance comment: Pt needs some UE support for standing and mobility                           ADL either performed or assessed with clinical judgement   ADL Overall ADL's : Needs assistance/impaired Eating/Feeding: Set up;Sitting   Grooming: Wash/dry face;Sitting;Supervision/safety   Upper Body Bathing: Supervision/ safety;Sitting   Lower Body Bathing: Minimal assistance;Sit to/from stand   Upper Body Dressing : Minimal assistance;Sitting   Lower Body Dressing: Moderate assistance;Sit to/from stand   Toilet Transfer: Minimal assistance;Ambulation;Rolling walker (2 wheels)   Toileting-  Clothing Manipulation and Hygiene: Sit to/from stand;Minimal assistance Toileting - Clothing Manipulation Details (indicate cue type and reason): simulated     Functional mobility during ADLs: Minimal  assistance (functional transfers with use of the RW for support) General ADL Comments: Pt's daughter present and able to help with providing PLOF information.  Pt with some receptive deficits as well as expressive.  Needs mod demonstrational cueing for hand placement with sit to stand from the recliner.  Difficulty noted when attempting to follow directions for finger to nose testing, vision testing, and MMT.  Daughter reports that wife can provide 24 hour supervision post discharge but that she cannot provide a lot of physical assist.     Vision Baseline Vision/History: 0 No visual deficits Ability to See in Adequate Light: 0 Adequate Patient Visual Report: No change from baseline Vision Assessment?: Yes Eye Alignment: Within Functional Limits Ocular Range of Motion: Within Functional Limits Alignment/Gaze Preference: Within Defined Limits Tracking/Visual Pursuits: Able to track stimulus in all quads without difficulty Convergence: Within functional limits Visual Fields:  (pt unable to understand directions for following peripheral testing but able to identify correct fingers being held up by therapist in different quadrants 5/6 times.)     Perception Perception: Within Functional Limits       Praxis Praxis: Impaired Praxis Impairment Details: Ideomotor     Pertinent Vitals/Pain Pain Assessment Pain Assessment: Faces Faces Pain Scale: No hurt     Extremity/Trunk Assessment Upper Extremity Assessment Upper Extremity Assessment: RUE deficits/detail RUE Deficits / Details: strength overall 4/5 grossly with some limitations for following MMT.  Slightly decreased FM coordination noted compared to the left when opening small soap bottle. RUE Sensation: WNL RUE Coordination: decreased fine motor   Lower Extremity Assessment Lower Extremity Assessment: Defer to PT evaluation   Cervical / Trunk Assessment Cervical / Trunk Assessment: Normal Cervical / Trunk Exceptions: overweight    Communication Communication Communication: Difficulty communicating thoughts/reduced clarity of speech;Hearing impairment;Difficulty following commands/understanding Following commands: Follows one step commands inconsistently Cueing Techniques: Verbal cues;Gestural cues;Tactile cues   Cognition Arousal: Alert Behavior During Therapy: WFL for tasks assessed/performed Overall Cognitive Status: Impaired/Different from baseline Area of Impairment: Following commands                       Following Commands: Follows one step commands with increased time       General Comments: Pt with decreased understanding of some aspects of coordination testing with finger to nose and difficutly completing basic movements to verbal command such as stick up your thumb, hold you arm up.     General Comments  Daughter present and provided home set-up and prior functional status. She is very concerned that her very petite mother will injure herself again while trying to help him.            Home Living Family/patient expects to be discharged to:: Private residence Living Arrangements: Spouse/significant other;Children (son with schizophrenia) Available Help at Discharge: Family;Available 24 hours/day (wife nor son cannot provide physical assist) Type of Home: House Home Access: Stairs to enter [employer] of Steps: 4 (*tall per daughter) Entrance Stairs-Rails: None Home Layout: One level     Bathroom Shower/Tub: Producer, Television/film/video: Standard Bathroom Accessibility: No   Home Equipment: Rollator (4 wheels);Cane - quad;BSC/3in1;Hospital bed;Grab bars - toilet;Shower seat - built in;Wheelchair - manual   Additional Comments: daughter reports he had progressed  from rollator to cane; they recently had to tear  down the ramp due to unsafe and plan to rebuild;      Prior Functioning/Environment Prior Level of Function : Needs assist             Mobility  Comments: using quad cane (vs previously rollator); assist up/down steps; limited to ~15 ft of ambulation due to severe pain left side of head when he was up ADLs Comments: only showers when HH nurse/?aide there to assist        OT Problem List: Impaired balance (sitting and/or standing);Decreased coordination;Decreased safety awareness;Decreased cognition;Impaired UE functional use;Decreased knowledge of use of DME or AE      OT Treatment/Interventions: Self-care/ADL training;Patient/family education;Balance training;Therapeutic exercise;Neuromuscular education;Therapeutic activities;DME and/or AE instruction;Cognitive remediation/compensation    OT Goals(Current goals can be found in the care plan section) Acute Rehab OT Goals Patient Stated Goal: Pt did not state but agreeable to therapy.  Pt's daughter expressed wanting additional therapy so he would get better before going home. OT Goal Formulation: With patient/family Time For Goal Achievement: 06/20/23 Potential to Achieve Goals: Good  OT Frequency: Min 1X/week       AM-PAC OT 6 Clicks Daily Activity     Outcome Measure Help from another person eating meals?: A Little Help from another person taking care of personal grooming?: A Little Help from another person toileting, which includes using toliet, bedpan, or urinal?: A Little Help from another person bathing (including washing, rinsing, drying)?: A Little Help from another person to put on and taking off regular upper body clothing?: A Little Help from another person to put on and taking off regular lower body clothing?: A Lot 6 Click Score: 17   End of Session Equipment Utilized During Treatment: Rolling walker (2 wheels) Nurse Communication: Mobility status  Activity Tolerance: Patient tolerated treatment well Patient left: in chair;with call bell/phone within reach;with chair alarm set  OT Visit Diagnosis: Unsteadiness on feet (R26.81);Other abnormalities of gait and  mobility (R26.89);Muscle weakness (generalized) (M62.81);Other symptoms and signs involving cognitive function;Cognitive communication deficit (R41.841) Symptoms and signs involving cognitive functions: Cerebral infarction                Time: 8995-8960 OT Time Calculation (min): 35 min Charges:  OT General Charges $OT Visit: 1 Visit OT Evaluation $OT Eval Moderate Complexity: 1 Mod  Lynwood Constant, OTR/L Acute Rehabilitation Services  Office [phone] 06/06/2023

## 2023-06-06 NOTE — Plan of Care (Signed)
  Problem: Education: Goal: Knowledge of disease or condition will improve 06/06/2023 0501 by Talbert Harlene BIRCH, RN Outcome: Progressing 06/06/2023 0500 by Talbert Harlene BIRCH, RN Outcome: Progressing   Problem: Ischemic Stroke/TIA Tissue Perfusion: Goal: Complications of ischemic stroke/TIA will be minimized 06/06/2023 0501 by Talbert Harlene BIRCH, RN Outcome: Progressing 06/06/2023 0500 by Talbert Harlene BIRCH, RN Outcome: Progressing   Problem: Cardiovascular: Goal: Ability to achieve and maintain adequate cardiovascular perfusion will improve Outcome: Progressing Goal: Vascular access site(s) Level 0-1 will be maintained 06/06/2023 0501 by Talbert Harlene BIRCH, RN Outcome: Progressing 06/06/2023 0500 by Talbert Harlene BIRCH, RN Outcome: Progressing   Problem: Cardiovascular: Goal: Vascular access site(s) Level 0-1 will be maintained 06/06/2023 0501 by Talbert Harlene BIRCH, RN Outcome: Progressing 06/06/2023 0500 by Talbert Harlene BIRCH, RN Outcome: Progressing

## 2023-06-06 NOTE — Progress Notes (Signed)
  Inpatient Rehabilitation Admissions Coordinator   Met with patient  daughter, Grayce, granddaughter and spoke with daughter, Kathi by phone.  We discussed goals and expectations of a possible CIR admit. They prefer SNF Rehab for prolonged rehab recovery for he has limited family assist locally.  They states his wife is in agreement with this plan. I will alert acute team and TOC. Please call me with any questions.   Heron Leavell, RN, MSN Rehab Admissions Coordinator [phone]

## 2023-06-06 NOTE — Consult Note (Signed)
 Cardiology Consultation   Patient ID: Nathan Proctor. MRN: [id number]; DOB: [birth year]  Admit date: 06/05/2023 Date of Consult: 06/06/2023  PCP:  Shelda Atlas, MD   [facility] Providers Cardiologist:  Dr. Okey   Patient Profile:   Nathan Proctor. is a 83 y.o. male with a hx of DM, CKD, HTN, HLD, history of CVA, chronic systolic heart failure (not routinely followed by cardiology)and morbid obesity who is being seen 06/06/2023 for the evaluation of reduced EF at the request of Neurology.   History of Present Illness:   Nathan Proctor is 83 y/o male with above medical history.   Patient was admitted to the hospital in June 2021 for stroke.  Patient was found to have nonsustained VT during this time. ECHO 09/2019: LVEF 45%.  He he also complained of dizziness at this time.  Cardiology noted hypertrophy on echo and he was placed on a beta blockers. Recommended he undergo outpatient ischemic evaluation after recovered from CVA.   Patient was last seen by cardiology in 11/2019 for evaluation of a near syncope episode that occurred during OT. Per cardiology office note, symptoms were not consistent with orthostasis and there were no known associated symptoms. All labs were normal.  At that time, outpatient monitor showed multiple episodes of nonsustained VT. He underwent nuclear stress test 12/17/19 that showed fixed inferior/inferolateral/anteroseptal perfusion defects consistent with prior infarct, no ischemia. Recommended that patient undergo cardiac catheterization. Patient consented to the procedure, but failed to get the study completed. He has been lost to follow up since that time.   Patient presented to ED on 06/04/22 via EMS for unresponsiveness, right-sided defects, and poor responsiveness. Initial vital: HR 86, BP 154/67, RR 20, SpO2 100%  Cr: 1.66, GFR 41, Toxin screen was negative.  CT Head with acute L MCA territory infarct.  CTA Head/Neck with abrupt occlusion  of proximal M2 L MCA vessel, ?2mm periophthalmic L ICA aneurysm, bilateral ICA plaque with progressive atherosclerotic plaque L carotid bifurcation with proximal L ICA severe, near-occlusive stenosis.  MRI of brain: Underlying mild chronic microvascular ischemic disease with small remote right cerebellar infarct. He was taken to [facility] for mechanical thrombectomy. L ICA stenosis and plaque rupture noted at the bulb, treated with stenting/angioplasty.   He underwent ECHO 06/05/2023 that revealed worsening LV function: LVEF 30%, LV moderately decreased function, mild LV hypertrophy, minimal MV regurgitation.  Following stroke and mechanical thrombectomy, angioplasty, patient is on ASA, Brilinta , and lipitor .    Cardiology consulted for reduced EF. During my interview, patient is a poor historian due to recent CVA events. He is accompanied by his wife and daughter who provide patient history. Patients wife reports that he has been getting SOB recently with minimal exertion. He is not very active in his daily life. He tends to just switch from bed to couch. She denies any known chest pain. She states he has been more concerned about a buzzing noise in his head and believe he did not notice any other symptoms due to noise being primary concern. His wife vaguely remembers the cardiac testing he received 3 years ago. She confirmed that they have not been followed by any other cardiology team since that time.    Past Medical History:  Diagnosis Date   Anemia of chronic disease    Ankle fracture 02/15/2016   Cancer (HCC)    Prostate   Chronic constipation    Chronic kidney disease    stage III   Diabetes  mellitus without complication (HCC)    type II    Diabetic peripheral neuropathy (HCC)    Failure to thrive (0-17)    Fracture of left lower leg    Gout    Hyperlipidemia    Hypertension    Morbid obesity (HCC)    Unstable gait     Past Surgical History:  Procedure Laterality Date   IR CT HEAD  LTD  06/05/2023   IR INTRAVSC STENT CERV CAROTID W/O EMB-PROT MOD SED INC ANGIO  06/05/2023   IR PERCUTANEOUS ART THROMBECTOMY/INFUSION INTRACRANIAL INC DIAG ANGIO  06/05/2023   IR US  GUIDE VASC ACCESS RIGHT  06/05/2023   ORIF ANKLE FRACTURE Left 02/29/2016   Procedure: OPEN REDUCTION INTERNAL FIXATION (ORIF) ANKLE FRACTURE;  Surgeon: Selinda Belvie Gosling, MD;  Location: [facility] ORS;  Service: Orthopedics;  Laterality: Left;   PROSTATE SURGERY     RADIOLOGY WITH ANESTHESIA N/A 06/05/2023   Procedure: IR WITH ANESTHESIA;  Surgeon: Radiologist, Medication, MD;  Location: [facility] OR;  Service: Radiology;  Laterality: N/A;      Inpatient Medications: Scheduled Meds:  aspirin   81 mg Per Tube Daily   atorvastatin   80 mg Per Tube Daily   Chlorhexidine  Gluconate Cloth  6 each Topical Daily   empagliflozin   10 mg Oral Daily   feeding supplement (PROSource TF20)  60 mL Per Tube Daily   insulin  aspart  2-6 Units Subcutaneous Q4H   metoprolol  succinate  25 mg Oral Daily   pantoprazole  (PROTONIX ) IV  40 mg Intravenous QHS   sodium chloride  flush  3 mL Intravenous Once   ticagrelor   90 mg Per Tube BID   Continuous Infusions:  clevidipine  Stopped (06/05/23 1155)   feeding supplement (OSMOLITE 1.5 CAL) 35 mL/hr at 06/06/23 1600   PRN Meds: acetaminophen  **OR** acetaminophen  (TYLENOL ) oral liquid 160 mg/5 mL **OR** acetaminophen , hydrALAZINE , labetalol , senna-docusate  Allergies:   No Known Allergies  Social History:   Social History   Socioeconomic History   Marital status: Married    Spouse name: Not on file   Number of children: Not on file   Years of education: Not on file   Highest education level: Not on file  Occupational History   Not on file  Tobacco Use   Smoking status: Former    Current packs/day: 0.00    Types: Cigarettes    Quit date: 1979    Years since quitting: 46.1   Smokeless tobacco: Never  Vaping Use   Vaping status: Former  Substance and Sexual Activity   Alcohol use: Not  Currently    Comment: Occasionally   Drug use: No   Sexual activity: Yes  Other Topics Concern   Not on file  Social History Narrative   Not on file   Social Drivers of Health   Financial Resource Strain: Not on file  Food Insecurity: Not on file  Transportation Needs: Not on file  Physical Activity: Not on file  Stress: Not on file  Social Connections: Not on file  Intimate Partner Violence: Not on file    Family History:    Family History  Problem Relation Age of Onset   Heart Problems Father    Heart Problems Sister      ROS:  Please see the history of present illness.   All other ROS reviewed and negative.     Physical Exam/Data:   Vitals:   06/06/23 1300 06/06/23 1400 06/06/23 1500 06/06/23 1600  BP: 127/67 134/80 135/61 118/64  Pulse: 61  70 63 63  Resp: 19 (!) 24 (!) 21 17  Temp:    98 F (36.7 C)  TempSrc:    Oral  SpO2: 95% 99% 100% 100%  Weight:        Intake/Output Summary (Last 24 hours) at 06/06/2023 1716 Last data filed at 06/06/2023 1600 Gross per 24 hour  Intake 1671.12 ml  Output 300 ml  Net 1371.12 ml      06/05/2023    8:00 AM 01/06/2023    5:07 PM 12/23/2019   11:30 AM  Last 3 Weights  Weight (lbs) 292 lb 5.3 oz 296 lb 15.4 oz 297 lb  Weight (kg) 132.6 kg 134.7 kg 134.718 kg     Body mass index is 41.94 kg/m.  General:  Well nourished, well developed, in no acute distress. Sitting upright in the chair  HEENT: normal Neck: no JVD Vascular: No carotid bruits; Distal pulses 2+ bilaterally Cardiac:  normal S1, S2; RRR; no murmur   Lungs:  clear to auscultation bilaterally, no wheezing, rhonchi or rales  Abd: soft, nontender  Ext: Trace edema in BLE Musculoskeletal:  No deformities, BUE and BLE strength normal and equal Skin: warm and dry  Neuro:  CNs 2-12 intact, no focal abnormalities noted Psych:  Normal affect   EKG:  The EKG was personally reviewed and demonstrates:  Not available for review  Telemetry:  Telemetry was personally  reviewed and demonstrates:  Predominantly NSR with occasional runs of NSVT. Longest lasting 13 beats   Relevant CV Studies: Echo 06/05/23  1. Left ventricular ejection fraction, by estimation, is 30%. The left  ventricle has moderately decreased function. The left ventricle  demonstrates global hypokinesis. There is mild left ventricular  hypertrophy.   2. Right ventricular systolic function is normal. The right ventricular  size is normal.   3. Trivial mitral valve regurgitation.   4. The aortic valve is tricuspid. Aortic valve regurgitation is not  visualized.   5. The inferior vena cava is normal in size with greater than 50%  respiratory variability, suggesting right atrial pressure of 3 mmHg.   Comparison(s): The left ventricular function is worsened.   Echo 10/05/19 1. Left ventricular ejection fraction, by estimation, is 45 to 50%. The  left ventricle has normal function. The left ventricle demonstrates global  hypokinesis. There is severe left ventricular hypertrophy. Left  ventricular diastolic parameters are  consistent with Grade I diastolic dysfunction (impaired relaxation).   2. Right ventricular systolic function is low normal. The right  ventricular size is normal.   3. The mitral valve is abnormal. Trivial mitral valve regurgitation.   4. The aortic valve is tricuspid. Aortic valve regurgitation is not  visualized. Mild aortic valve sclerosis is present, with no evidence of  aortic valve stenosis.   5. Aortic dilatation noted. There is moderate dilatation at the level of  the sinuses of Valsalva measuring 44 mm.   6. Definity  contrast not given due to possible IV infiltration   Laboratory Data:  High Sensitivity Troponin:  No results for input(s): TROPONINIHS in the last 720 hours.   Chemistry Recent Labs  Lab 06/05/23 0815 06/05/23 0817 06/05/23 1500 06/05/23 1811 06/06/23 0557  NA 142 142  --   --  139  K 3.8 3.7  --   --  5.0  CL 109 108  --   --  110   CO2 20*  --   --   --  16*  GLUCOSE 203* 201*  --   --  161*  BUN 18 21  --   --  23  CREATININE 1.59* 1.60*  --   --  1.66*  CALCIUM  9.4  --   --   --  8.7*  MG  --   --  1.8 1.9 2.3  GFRNONAA 43*  --   --   --  41*  ANIONGAP 13  --   --   --  13    Recent Labs  Lab 06/05/23 0815 06/06/23 0557  PROT 6.5 6.1*  ALBUMIN  3.6 3.3*  AST 20 26  ALT 13 12  ALKPHOS 76 72  BILITOT 1.2 1.1   Lipids  Recent Labs  Lab 06/06/23 0557  CHOL 183  TRIG 81  HDL 60  LDLCALC 107*  CHOLHDL 3.1    Hematology Recent Labs  Lab 06/05/23 0815 06/05/23 0817 06/06/23 0557  WBC 6.8  --  10.0  RBC 3.88*  --  3.72*  HGB 11.9* 12.6* 11.4*  HCT 37.8* 37.0* 36.3*  MCV 97.4  --  97.6  MCH 30.7  --  30.6  MCHC 31.5  --  31.4  RDW 13.5  --  13.8  PLT 181  --  177   Thyroid  No results for input(s): TSH, FREET4 in the last 168 hours.  BNPNo results for input(s): BNP, PROBNP in the last 168 hours.  DDimer No results for input(s): DDIMER in the last 168 hours.   Radiology/Studies:  MR BRAIN WO CONTRAST Result Date: 06/06/2023 CLINICAL DATA:  Follow-up examination for stroke. EXAM: MRI HEAD WITHOUT CONTRAST TECHNIQUE: Multiplanar, multiecho pulse sequences of the brain and surrounding structures were obtained without intravenous contrast. COMPARISON:  Comparison made to multiple previous exams from 06/05/2023. FINDINGS: Brain: Examination degraded by motion artifact. Cerebral volume within normal limits for age. Patchy T2/FLAIR hyperintensity involving the periventricular deep white matter both cerebral hemispheres, consistent with chronic small vessel ischemic disease, mild in nature. Small remote infarct noted within the right cerebellum. Confluent restricted diffusion involving the left insula and left frontal operculum, consistent with evolving acute left MCA distribution infarct. Area of infarction measures up to approximately 6 cm in AP diameter. Prominent associated susceptibility  artifact, consistent with petechial hemorrhage (series 7, image 61) ( Heidelberg classification 1b: HI2, confluent petechiae, no mass effect. No frank or organized hematoma evident by MRI. No significant regional mass effect. Few additional small foci of infarction noted within the left parieto-occipital region posteriorly (series 3, images 40, 29). Apparent diffusion signal along the right frontal parafalcine region felt to be consistent with artifact related to dural calcification. Note made of a few additional chronic micro hemorrhages within the left cerebellum and right thalamus. No mass lesion or midline shift. Ventricles normal size without hydrocephalus. No extra-axial fluid collection. Pituitary gland suprasellar region within normal limits. Vascular: Major intracranial vascular flow voids are maintained. Skull and upper cervical spine: Craniocervical junction within normal limits. Bone marrow signal intensity overall within normal limits. No scalp soft tissue abnormality. Sinuses/Orbits: Prior bilateral ocular lens replacement. Paranasal sinuses are clear. No mastoid effusion. Other: None. IMPRESSION: 1. Evolving acute left MCA distribution infarct, most pronounced at the left insula and left frontal operculum. Prominent associated petechial hemorrhage without frank intraparenchymal hematoma (Heidelberg classification 1b: HI2, confluent petechiae, no mass effect). 2. Few additional small foci of acute infarction within the left parieto-occipital region posteriorly. 3. Underlying mild chronic microvascular ischemic disease with small remote right cerebellar infarct. Electronically Signed   By: Morene Hoard M.D.   On: 06/06/2023  03:12   ECHOCARDIOGRAM COMPLETE Result Date: 06/05/2023    ECHOCARDIOGRAM REPORT   Patient Name:   Yukio Bisping. Date of Exam: 06/05/2023 Medical Rec #:  [id number]              Height:       70.0 in Accession #:    [id number]             Weight:       292.3 lb Date  of Birth:  Aug 07, 1940              BSA:          2.453 m Patient Age:    82 years               BP:           129/72 mmHg Patient Gender: M                      HR:           89 bpm. Exam Location:  Inpatient Procedure: 2D Echo, Color Doppler, Cardiac Doppler and Intracardiac            Opacification Agent Indications:    Stroke I63.9  History:        Patient has prior history of Echocardiogram examinations, most                 recent 10/06/2019. Risk Factors:Diabetes, Hypertension and                 Dyslipidemia.  Sonographer:    Tinnie Gosling RDCS Referring Phys: ERIN C LEHNER IMPRESSIONS  1. Left ventricular ejection fraction, by estimation, is 30%. The left ventricle has moderately decreased function. The left ventricle demonstrates global hypokinesis. There is mild left ventricular hypertrophy.  2. Right ventricular systolic function is normal. The right ventricular size is normal.  3. Trivial mitral valve regurgitation.  4. The aortic valve is tricuspid. Aortic valve regurgitation is not visualized.  5. The inferior vena cava is normal in size with greater than 50% respiratory variability, suggesting right atrial pressure of 3 mmHg. Comparison(s): The left ventricular function is worsened. FINDINGS  Left Ventricle: Left ventricular ejection fraction, by estimation, is 30%. The left ventricle has moderately decreased function. The left ventricle demonstrates global hypokinesis. Definity  contrast agent was given IV to delineate the left ventricular endocardial borders. The left ventricular internal cavity size was normal in size. There is mild left ventricular hypertrophy. Right Ventricle: The right ventricular size is normal. Right vetricular wall thickness was not assessed. Right ventricular systolic function is normal. Left Atrium: Left atrial size was normal in size. Right Atrium: Right atrial size was normal in size. Pericardium: There is no evidence of pericardial effusion. Mitral Valve: There is mild  thickening of the mitral valve leaflet(s). Mild to moderate mitral annular calcification. Trivial mitral valve regurgitation. Tricuspid Valve: The tricuspid valve is normal in structure. Tricuspid valve regurgitation is trivial. Aortic Valve: The aortic valve is tricuspid. Aortic valve regurgitation is not visualized. Pulmonic Valve: The pulmonic valve was normal in structure. Pulmonic valve regurgitation is not visualized. Aorta: The aortic root and ascending aorta are structurally normal, with no evidence of dilitation. Venous: The inferior vena cava is normal in size with greater than 50% respiratory variability, suggesting right atrial pressure of 3 mmHg. IAS/Shunts: The interatrial septum was not assessed.  LEFT VENTRICLE PLAX 2D LVIDd:  5.00 cm   Diastology LVIDs:         4.40 cm   LV e' lateral: 5.33 cm/s LV PW:         1.20 cm LV IVS:        1.10 cm LVOT diam:     2.30 cm LV SV:         55 LV SV Index:   23 LVOT Area:     4.15 cm  RIGHT VENTRICLE RV S prime:     14.40 cm/s LEFT ATRIUM         Index LA diam:    4.60 cm 1.88 cm/m  AORTIC VALVE LVOT Vmax:   65.50 cm/s LVOT Vmean:  45.200 cm/s LVOT VTI:    0.133 m  AORTA Ao Root diam: 3.00 cm Ao Asc diam:  3.50 cm  SHUNTS Systemic VTI:  0.13 m Systemic Diam: 2.30 cm Vina Gull MD Electronically signed by Vina Gull MD Signature Date/Time: 06/05/2023/9:33:38 PM    Final    DG Abd Portable 1V Result Date: 06/05/2023 CLINICAL DATA:  Feeding tube placement. EXAM: PORTABLE ABDOMEN - 1 VIEW COMPARISON:  None Available. FINDINGS: Distal tip of feeding tube is seen in expected position of distal stomach. IMPRESSION: Distal tip of feeding tube seen in expected position of distal stomach. Electronically Signed   By: Lynwood Landy Raddle M.D.   On: 06/05/2023 15:49   CT HEAD WO CONTRAST Result Date: 06/05/2023 CLINICAL DATA:  Stroke, follow-up. Status post intracranial mechanical thrombectomy and stenting of the left ICA bifurcation. EXAM: CT HEAD WITHOUT CONTRAST  TECHNIQUE: Contiguous axial images were obtained from the base of the skull through the vertex without intravenous contrast. RADIATION DOSE REDUCTION: This exam was performed according to the departmental dose-optimization program which includes automated exposure control, adjustment of the mA and/or kV according to patient size and/or use of iterative reconstruction technique. COMPARISON:  CT head without contrast and CT angio head and neck 06/05/2023. By plain CT 06/05/2023. FINDINGS: Brain: The study is mildly degraded by patient motion. The left insular and left opercular infarct is somewhat obscured by patient motion. The cortex is slightly hyperdense, potentially reflecting reperfusion. No hemorrhage is present. Basal ganglia are intact. Subcortical white matter hypoattenuation in the high left frontal lobe is new. Mild right-sided white matter disease is stable. Basal ganglia are intact. A remote lacunar infarct is again noted in the right cerebellum. The brainstem and cerebellum are otherwise within normal limits. Vascular: Atherosclerotic calcifications are present within the cavernous internal carotid arteries and at the normal origin of both vertebral arteries. No hyperdense vessel is present. Skull: Calvarium is intact. No focal lytic or blastic lesions are present. No significant extracranial soft tissue lesion is present. Sinuses/Orbits: The paranasal sinuses and mastoid air cells are clear. Bilateral lens replacements are noted. Globes and orbits are otherwise unremarkable. IMPRESSION: 1. The left insular and left opercular infarct is somewhat obscured by patient motion. 2. The cortex is slightly hyperdense, potentially reflecting reperfusion. 3. No hemorrhage. 4. Subcortical white matter hypoattenuation in the high left frontal lobe is new. This may reflect ischemic changes or edema related to the reperfusion. 5. Remote lacunar infarct of the right cerebellum. 6. Stable mild right-sided white matter  disease. This likely reflects the sequela of chronic microvascular ischemia. Electronically Signed   By: Lonni Necessary M.D.   On: 06/05/2023 14:38   IR PERCUTANEOUS ART THROMBECTOMY/INFUSION INTRACRANIAL INC DIAG ANGIO Result Date: 06/05/2023 INDICATION: 83 year old male presenting with right-sided weakness  and aphasia; NIHSS 28. His last known well was 10 p.m. on 06/04/2023. His past medical history significant for prior stroke, hypertension, diabetes and chronic kidney disease; baseline modified Rankin scale 0. Head CT showed hypodensity within the left insula, basal ganglia and left frontal operculum (ASPECTS 7). No IV thrombolytic given as patient was outside the window. CT angiogram of the head and neck showed an occlusion of a left M2/MCA anterior division branch. CT perfusion showed a 41 mL core infarct with a 45 mL ischemic penumbra. She was transferred to our service for mechanical thrombectomy. EXAM: ULTRASOUND-GUIDED VASCULAR ACCESS DIAGNOSTIC CEREBRAL ANGIOGRAM MECHANICAL THROMBECTOMY FLAT PANEL HEAD CT LEFT CAROTID STENTING AND ANGIOPLASTY WITHOUT CEREBRAL PROTECTION DEVICE COMPARISON:  CT/CT angiogram of the head and neck June 05, 2023. MEDICATIONS: No antibiotics administered. ANESTHESIA/SEDATION: The procedure was performed under general anesthesia. CONTRAST:  80 mL of Omnipaque  300 milligram/mL FLUOROSCOPY: Radiation Exposure Index (as provided by the fluoroscopic device): 1315 mGy Kerma COMPLICATIONS: None immediate. TECHNIQUE: Informed written consent was obtained from the patient's wife after a thorough discussion of the procedural risks, benefits and alternatives. All questions were addressed. Maximal Sterile Barrier Technique was utilized including caps, mask, sterile gowns, sterile gloves, sterile drape, hand hygiene and skin antiseptic. A timeout was performed prior to the initiation of the procedure. The right groin was prepped and draped in the usual sterile fashion. Using a  micropuncture kit and the modified Seldinger technique, access was gained to the right common femoral artery and an 8 French sheath was placed. Real-time ultrasound guidance was utilized for vascular access including the acquisition of a permanent ultrasound image documenting patency of the accessed vessel. Under fluoroscopy, an 8 French Walrus balloon guide catheter was navigated over a 6 French VTK catheter and a 0.035 Terumo Glidewire into the aortic arch. The catheter was placed into the left common carotid artery and then advanced into the left internal carotid artery. The diagnostic catheter was removed. Frontal and lateral angiograms of the head were obtained. FINDINGS: 1. Ultrasound showed heavily calcified right common femoral artery is maintained patency and caliber. 2. Proximal occlusion of a left M2/MCA anterior division branch. 3. Atherosclerotic changes of the intracranial left ICA with mild stenosis at the distal cavernous segment. 4. 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 PROCEDURE: Using biplane roadmap guidance, a Red 62 aspiration catheter was navigated over Colossus 35 microguidewire into the cavernous segment of the left ICA. The aspiration catheter was then advanced to the level of occlusion and connected to an aspiration pump. Continuous aspiration was performed for 2 minutes. The guide catheter was connected to a VacLok syringe and the guiding catheter balloon was inflated. The aspiration catheter was subsequently removed under constant aspiration. The guide catheter was aspirated for debris. Left internal carotid artery angiograms with frontal and lateral views of the head showed complete recanalization of the left MCA vascular tree. The guide catheter was retracted into the neck. Frontal and lateral angiograms of the neck were obtained. Improvement of the degree of stenosis in the  carotid bulb compared to prior CT angiogram. There is prominent luminal irregularity at the carotid bulb residual moderate stenosis. Increased tortuosity of the proximal/mid cervical left ICA with kinking. Left internal carotid artery angiograms with left anterior oblique views of the neck showed evidence of filling defects at the level of the carotid bulb. Flat panel CT of the head was obtained and post  processed in a separate workstation with concurrent attending physician supervision. Selected images were sent to PACS. No evidence of hemorrhagic complication. There is mild contrast staining of the left insular and frontal cortex. Repeat left internal carotid artery angiograms with frontal and lateral views of the head showed Amy seen left M3/MCA branch to the left parietal region. Left common carotid artery angiograms with frontal and lateral views of the neck showed vertebra Gretchen of stenosis at the left ICA bulb with more prominent filling defect. At this point, patient was loaded on cangrelor  followed by continuous drip. Using biplane roadmap guidance, a 4-7 mm Emboshield NAV6 cerebral protection device was advanced into the cervical left ICA. However, multiple attempts to advance a cerebral protection device through the left ICA kinking proved unsuccessful. The cerebral protection device was subsequently removed. Using biplane roadmap guidance, a 10-8 x 40 mm XACT carotid stent was navigated and deployed from the distal left common carotid artery to the proximal left internal carotid artery, proximal to the vessel kinking. Suboptimal stent expansion was noted. Then, a 6 x 30 mm Viatrac balloon was navigated into the recently deployed stent. Angioplasty was performed under fluoroscopy. Left internal carotid artery angiograms with frontal and lateral views of the neck showed adequate stent positioning and expansion with brisk anterograde flow. Left internal carotid artery angiograms with frontal and lateral  views of the head showed improvement of anterograde flow in the left MCA vascular tree with brisk anterograde flow. Delayed left common carotid artery angiograms with frontal and lateral views of the neck showed no evidence of clot formation within the stent. The catheter was subsequently Riddle. Right common femoral artery angiogram was obtained in right anterior oblique view. The puncture is at the level of the common femoral artery. The artery has normal caliber, adequate for closure device. The sheath was exchanged over the wire for an 8 French Angio-Seal which was utilized for access closure. Immediate hemostasis was achieved. IMPRESSION: 1. Successful mechanical thrombectomy for treatment of a proximal left M2/MCA anterior division branch occlusion achieving complete recanalization (TICI 3). 2. 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 PLAN: Continue cangrelor  infusion until patient is transitioned to oral dual antiplatelet therapy. Electronically Signed   By: Katyucia  de Macedo Rodrigues M.D.   On: 06/05/2023 13:16   IR US  Guide Vasc Access Right Result Date: 06/05/2023 INDICATION: 83 year old male presenting with right-sided weakness and aphasia; NIHSS 28. His last known well was 10 p.m. on 06/04/2023. His past medical history significant for prior stroke, hypertension, diabetes and chronic kidney disease; baseline modified Rankin scale 0. Head CT showed hypodensity within the left insula, basal ganglia and left frontal operculum (ASPECTS 7). No IV thrombolytic given as patient was outside the window. CT angiogram of the head and neck showed an occlusion of a left M2/MCA anterior division branch. CT perfusion showed a 41 mL core infarct with a 45 mL ischemic penumbra. She was transferred to our service for mechanical thrombectomy. EXAM: ULTRASOUND-GUIDED  VASCULAR ACCESS DIAGNOSTIC CEREBRAL ANGIOGRAM MECHANICAL THROMBECTOMY FLAT PANEL HEAD CT LEFT CAROTID STENTING AND ANGIOPLASTY WITHOUT CEREBRAL PROTECTION DEVICE COMPARISON:  CT/CT angiogram of the head and neck June 05, 2023. MEDICATIONS: No antibiotics administered. ANESTHESIA/SEDATION: The procedure was performed under general anesthesia. CONTRAST:  80 mL of Omnipaque  300 milligram/mL FLUOROSCOPY: Radiation Exposure Index (as provided by the fluoroscopic device): 1315 mGy Kerma COMPLICATIONS: None immediate. TECHNIQUE: Informed written  consent was obtained from the patient's wife after a thorough discussion of the procedural risks, benefits and alternatives. All questions were addressed. Maximal Sterile Barrier Technique was utilized including caps, mask, sterile gowns, sterile gloves, sterile drape, hand hygiene and skin antiseptic. A timeout was performed prior to the initiation of the procedure. The right groin was prepped and draped in the usual sterile fashion. Using a micropuncture kit and the modified Seldinger technique, access was gained to the right common femoral artery and an 8 French sheath was placed. Real-time ultrasound guidance was utilized for vascular access including the acquisition of a permanent ultrasound image documenting patency of the accessed vessel. Under fluoroscopy, an 8 French Walrus balloon guide catheter was navigated over a 6 French VTK catheter and a 0.035 Terumo Glidewire into the aortic arch. The catheter was placed into the left common carotid artery and then advanced into the left internal carotid artery. The diagnostic catheter was removed. Frontal and lateral angiograms of the head were obtained. FINDINGS: 1. Ultrasound showed heavily calcified right common femoral artery is maintained patency and caliber. 2. Proximal occlusion of a left M2/MCA anterior division branch. 3. Atherosclerotic changes of the intracranial left ICA with mild stenosis at the distal cavernous  segment. 4. 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 PROCEDURE: Using biplane roadmap guidance, a Red 62 aspiration catheter was navigated over Colossus 35 microguidewire into the cavernous segment of the left ICA. The aspiration catheter was then advanced to the level of occlusion and connected to an aspiration pump. Continuous aspiration was performed for 2 minutes. The guide catheter was connected to a VacLok syringe and the guiding catheter balloon was inflated. The aspiration catheter was subsequently removed under constant aspiration. The guide catheter was aspirated for debris. Left internal carotid artery angiograms with frontal and lateral views of the head showed complete recanalization of the left MCA vascular tree. The guide catheter was retracted into the neck. Frontal and lateral angiograms of the neck were obtained. Improvement of the degree of stenosis in the carotid bulb compared to prior CT angiogram. There is prominent luminal irregularity at the carotid bulb residual moderate stenosis. Increased tortuosity of the proximal/mid cervical left ICA with kinking. Left internal carotid artery angiograms with left anterior oblique views of the neck showed evidence of filling defects at the level of the carotid bulb. Flat panel CT of the head was obtained and post processed in a separate workstation with concurrent attending physician supervision. Selected images were sent to PACS. No evidence of hemorrhagic complication. There is mild contrast staining of the left insular and frontal cortex. Repeat left internal carotid artery angiograms with frontal and lateral views of the head showed Amy seen left M3/MCA branch to the left parietal region. Left common carotid artery angiograms with frontal and lateral views of the neck showed vertebra Gretchen of stenosis at the left ICA bulb with more prominent  filling defect. At this point, patient was loaded on cangrelor  followed by continuous drip. Using biplane roadmap guidance, a 4-7 mm Emboshield NAV6 cerebral protection device was advanced into the cervical left ICA. However, multiple attempts to advance a cerebral protection device through the left ICA kinking proved unsuccessful. The cerebral protection device was subsequently removed. Using biplane roadmap guidance, a 10-8 x 40 mm XACT carotid stent was navigated and deployed from the distal left common carotid artery to the proximal left internal carotid artery, proximal  to the vessel kinking. Suboptimal stent expansion was noted. Then, a 6 x 30 mm Viatrac balloon was navigated into the recently deployed stent. Angioplasty was performed under fluoroscopy. Left internal carotid artery angiograms with frontal and lateral views of the neck showed adequate stent positioning and expansion with brisk anterograde flow. Left internal carotid artery angiograms with frontal and lateral views of the head showed improvement of anterograde flow in the left MCA vascular tree with brisk anterograde flow. Delayed left common carotid artery angiograms with frontal and lateral views of the neck showed no evidence of clot formation within the stent. The catheter was subsequently Riddle. Right common femoral artery angiogram was obtained in right anterior oblique view. The puncture is at the level of the common femoral artery. The artery has normal caliber, adequate for closure device. The sheath was exchanged over the wire for an 8 French Angio-Seal which was utilized for access closure. Immediate hemostasis was achieved. IMPRESSION: 1. Successful mechanical thrombectomy for treatment of a proximal left M2/MCA anterior division branch occlusion achieving complete recanalization (TICI 3). 2. Atherosclerotic disease of the left carotid bifurcation with stenosis and clot formation suggesting acute plaque rupture treated with stenting  and angioplasty with resolution of stenosis. PLAN: Continue cangrelor  infusion until patient is transitioned to oral dual antiplatelet therapy. Electronically Signed   By: Katyucia  de Macedo Rodrigues M.D.   On: 06/05/2023 13:16   IR INTRAVSC STENT CERV CAROTID W/O EMB-PROT MOD SED Result Date: 06/05/2023 INDICATION: 83 year old male presenting with right-sided weakness and aphasia; NIHSS 28. His last known well was 10 p.m. on 06/04/2023. His past medical history significant for prior stroke, hypertension, diabetes and chronic kidney disease; baseline modified Rankin scale 0. Head CT showed hypodensity within the left insula, basal ganglia and left frontal operculum (ASPECTS 7). No IV thrombolytic given as patient was outside the window. CT angiogram of the head and neck showed an occlusion of a left M2/MCA anterior division branch. CT perfusion showed a 41 mL core infarct with a 45 mL ischemic penumbra. She was transferred to our service for mechanical thrombectomy. EXAM: ULTRASOUND-GUIDED VASCULAR ACCESS DIAGNOSTIC CEREBRAL ANGIOGRAM MECHANICAL THROMBECTOMY FLAT PANEL HEAD CT LEFT CAROTID STENTING AND ANGIOPLASTY WITHOUT CEREBRAL PROTECTION DEVICE COMPARISON:  CT/CT angiogram of the head and neck June 05, 2023. MEDICATIONS: No antibiotics administered. ANESTHESIA/SEDATION: The procedure was performed under general anesthesia. CONTRAST:  80 mL of Omnipaque  300 milligram/mL FLUOROSCOPY: Radiation Exposure Index (as provided by the fluoroscopic device): 1315 mGy Kerma COMPLICATIONS: None immediate. TECHNIQUE: Informed written consent was obtained from the patient's wife after a thorough discussion of the procedural risks, benefits and alternatives. All questions were addressed. Maximal Sterile Barrier Technique was utilized including caps, mask, sterile gowns, sterile gloves, sterile drape, hand hygiene and skin antiseptic. A timeout was performed prior to the initiation of the procedure. The right groin was  prepped and draped in the usual sterile fashion. Using a micropuncture kit and the modified Seldinger technique, access was gained to the right common femoral artery and an 8 French sheath was placed. Real-time ultrasound guidance was utilized for vascular access including the acquisition of a permanent ultrasound image documenting patency of the accessed vessel. Under fluoroscopy, an 8 French Walrus balloon guide catheter was navigated over a 6 French VTK catheter and a 0.035 Terumo Glidewire into the aortic arch. The catheter was placed into the left common carotid artery and then advanced into the left internal carotid artery. The diagnostic catheter was removed. Frontal and lateral angiograms of the  head were obtained. FINDINGS: 1. Ultrasound showed heavily calcified right common femoral artery is maintained patency and caliber. 2. Proximal occlusion of a left M2/MCA anterior division branch. 3. Atherosclerotic changes of the intracranial left ICA with mild stenosis at the distal cavernous segment. 4. 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 PROCEDURE: Using biplane roadmap guidance, a Red 62 aspiration catheter was navigated over Colossus 35 microguidewire into the cavernous segment of the left ICA. The aspiration catheter was then advanced to the level of occlusion and connected to an aspiration pump. Continuous aspiration was performed for 2 minutes. The guide catheter was connected to a VacLok syringe and the guiding catheter balloon was inflated. The aspiration catheter was subsequently removed under constant aspiration. The guide catheter was aspirated for debris. Left internal carotid artery angiograms with frontal and lateral views of the head showed complete recanalization of the left MCA vascular tree. The guide catheter was retracted into the neck. Frontal and lateral angiograms of the neck  were obtained. Improvement of the degree of stenosis in the carotid bulb compared to prior CT angiogram. There is prominent luminal irregularity at the carotid bulb residual moderate stenosis. Increased tortuosity of the proximal/mid cervical left ICA with kinking. Left internal carotid artery angiograms with left anterior oblique views of the neck showed evidence of filling defects at the level of the carotid bulb. Flat panel CT of the head was obtained and post processed in a separate workstation with concurrent attending physician supervision. Selected images were sent to PACS. No evidence of hemorrhagic complication. There is mild contrast staining of the left insular and frontal cortex. Repeat left internal carotid artery angiograms with frontal and lateral views of the head showed Amy seen left M3/MCA branch to the left parietal region. Left common carotid artery angiograms with frontal and lateral views of the neck showed vertebra Gretchen of stenosis at the left ICA bulb with more prominent filling defect. At this point, patient was loaded on cangrelor  followed by continuous drip. Using biplane roadmap guidance, a 4-7 mm Emboshield NAV6 cerebral protection device was advanced into the cervical left ICA. However, multiple attempts to advance a cerebral protection device through the left ICA kinking proved unsuccessful. The cerebral protection device was subsequently removed. Using biplane roadmap guidance, a 10-8 x 40 mm XACT carotid stent was navigated and deployed from the distal left common carotid artery to the proximal left internal carotid artery, proximal to the vessel kinking. Suboptimal stent expansion was noted. Then, a 6 x 30 mm Viatrac balloon was navigated into the recently deployed stent. Angioplasty was performed under fluoroscopy. Left internal carotid artery angiograms with frontal and lateral views of the neck showed adequate stent positioning and expansion with brisk anterograde flow. Left  internal carotid artery angiograms with frontal and lateral views of the head showed improvement of anterograde flow in the left MCA vascular tree with brisk anterograde flow. Delayed left common carotid artery angiograms with frontal and lateral views of the neck showed no evidence of clot formation within the stent. The catheter was subsequently Riddle. Right common femoral artery angiogram was obtained in right anterior oblique view. The puncture is at the level of the common femoral artery. The artery has normal caliber, adequate for closure device. The sheath was exchanged over the wire for an 8 French Angio-Seal which was utilized for access closure. Immediate hemostasis was achieved. IMPRESSION: 1. Successful mechanical thrombectomy for  treatment of a proximal left M2/MCA anterior division branch occlusion achieving complete recanalization (TICI 3). 2. 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 PLAN: Continue cangrelor  infusion until patient is transitioned to oral dual antiplatelet therapy. Electronically Signed   By: Katyucia  de Macedo Rodrigues M.D.   On: 06/05/2023 13:16   IR CT Head Ltd Result Date: 06/05/2023 INDICATION: 83 year old male presenting with right-sided weakness and aphasia; NIHSS 28. His last known well was 10 p.m. on 06/04/2023. His past medical history significant for prior stroke, hypertension, diabetes and chronic kidney disease; baseline modified Rankin scale 0. Head CT showed hypodensity within the left insula, basal ganglia and left frontal operculum (ASPECTS 7). No IV thrombolytic given as patient was outside the window. CT angiogram of the head and neck showed an occlusion of a left M2/MCA anterior division branch. CT perfusion showed a 41 mL core infarct with a 45 mL ischemic penumbra. She was transferred to our service for mechanical  thrombectomy. EXAM: ULTRASOUND-GUIDED VASCULAR ACCESS DIAGNOSTIC CEREBRAL ANGIOGRAM MECHANICAL THROMBECTOMY FLAT PANEL HEAD CT LEFT CAROTID STENTING AND ANGIOPLASTY WITHOUT CEREBRAL PROTECTION DEVICE COMPARISON:  CT/CT angiogram of the head and neck June 05, 2023. MEDICATIONS: No antibiotics administered. ANESTHESIA/SEDATION: The procedure was performed under general anesthesia. CONTRAST:  80 mL of Omnipaque  300 milligram/mL FLUOROSCOPY: Radiation Exposure Index (as provided by the fluoroscopic device): 1315 mGy Kerma COMPLICATIONS: None immediate. TECHNIQUE: Informed written consent was obtained from the patient's wife after a thorough discussion of the procedural risks, benefits and alternatives. All questions were addressed. Maximal Sterile Barrier Technique was utilized including caps, mask, sterile gowns, sterile gloves, sterile drape, hand hygiene and skin antiseptic. A timeout was performed prior to the initiation of the procedure. The right groin was prepped and draped in the usual sterile fashion. Using a micropuncture kit and the modified Seldinger technique, access was gained to the right common femoral artery and an 8 French sheath was placed. Real-time ultrasound guidance was utilized for vascular access including the acquisition of a permanent ultrasound image documenting patency of the accessed vessel. Under fluoroscopy, an 8 French Walrus balloon guide catheter was navigated over a 6 French VTK catheter and a 0.035 Terumo Glidewire into the aortic arch. The catheter was placed into the left common carotid artery and then advanced into the left internal carotid artery. The diagnostic catheter was removed. Frontal and lateral angiograms of the head were obtained. FINDINGS: 1. Ultrasound showed heavily calcified right common femoral artery is maintained patency and caliber. 2. Proximal occlusion of a left M2/MCA anterior division branch. 3. Atherosclerotic changes of the intracranial left ICA with  mild stenosis at the distal cavernous segment. 4. 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 PROCEDURE: Using biplane roadmap guidance, a Red 62 aspiration catheter was navigated over Colossus 35 microguidewire into the cavernous segment of the left ICA. The aspiration catheter was then advanced to the level of occlusion and connected to an aspiration pump. Continuous aspiration was performed for 2 minutes. The guide catheter was connected to a VacLok syringe and the guiding catheter balloon was inflated. The aspiration catheter was subsequently removed under constant aspiration. The guide catheter was aspirated for debris. Left internal carotid artery angiograms with frontal and lateral views of the head showed complete recanalization of the left MCA vascular tree. The guide catheter  was retracted into the neck. Frontal and lateral angiograms of the neck were obtained. Improvement of the degree of stenosis in the carotid bulb compared to prior CT angiogram. There is prominent luminal irregularity at the carotid bulb residual moderate stenosis. Increased tortuosity of the proximal/mid cervical left ICA with kinking. Left internal carotid artery angiograms with left anterior oblique views of the neck showed evidence of filling defects at the level of the carotid bulb. Flat panel CT of the head was obtained and post processed in a separate workstation with concurrent attending physician supervision. Selected images were sent to PACS. No evidence of hemorrhagic complication. There is mild contrast staining of the left insular and frontal cortex. Repeat left internal carotid artery angiograms with frontal and lateral views of the head showed Amy seen left M3/MCA branch to the left parietal region. Left common carotid artery angiograms with frontal and lateral views of the neck showed vertebra Gretchen of stenosis at  the left ICA bulb with more prominent filling defect. At this point, patient was loaded on cangrelor  followed by continuous drip. Using biplane roadmap guidance, a 4-7 mm Emboshield NAV6 cerebral protection device was advanced into the cervical left ICA. However, multiple attempts to advance a cerebral protection device through the left ICA kinking proved unsuccessful. The cerebral protection device was subsequently removed. Using biplane roadmap guidance, a 10-8 x 40 mm XACT carotid stent was navigated and deployed from the distal left common carotid artery to the proximal left internal carotid artery, proximal to the vessel kinking. Suboptimal stent expansion was noted. Then, a 6 x 30 mm Viatrac balloon was navigated into the recently deployed stent. Angioplasty was performed under fluoroscopy. Left internal carotid artery angiograms with frontal and lateral views of the neck showed adequate stent positioning and expansion with brisk anterograde flow. Left internal carotid artery angiograms with frontal and lateral views of the head showed improvement of anterograde flow in the left MCA vascular tree with brisk anterograde flow. Delayed left common carotid artery angiograms with frontal and lateral views of the neck showed no evidence of clot formation within the stent. The catheter was subsequently Riddle. Right common femoral artery angiogram was obtained in right anterior oblique view. The puncture is at the level of the common femoral artery. The artery has normal caliber, adequate for closure device. The sheath was exchanged over the wire for an 8 French Angio-Seal which was utilized for access closure. Immediate hemostasis was achieved. IMPRESSION: 1. Successful mechanical thrombectomy for treatment of a proximal left M2/MCA anterior division branch occlusion achieving complete recanalization (TICI 3). 2. Atherosclerotic disease of the left carotid bifurcation with stenosis and clot formation suggesting  acute plaque rupture treated with stenting and angioplasty with resolution of stenosis. PLAN: Continue cangrelor  infusion until patient is transitioned to oral dual antiplatelet therapy. Electronically Signed   By: Katyucia  de Macedo Rodrigues M.D.   On: 06/05/2023 13:16   CT ANGIO HEAD NECK W WO CM W PERF (CODE STROKE) Addendum Date: 06/05/2023 ADDENDUM REPORT: 06/05/2023 12:25 ADDENDUM: Please note, there is a dictation error within CTA neck impression #1, which should read: The common carotid and internal carotid arteries are patent within the neck. Atherosclerotic plaque bilaterally. Most notably, there is progressive atherosclerotic plaque about the left carotid bifurcation and within the proximal left ICA with resultant severe near occlusive stenosis of the proximal left ICA. Also of note, atherosclerotic plaque about the right carotid bifurcation results in a 40% stenosis at the right ICA origin. Electronically Signed  By: Rockey Childs D.O.   On: 06/05/2023 12:25   Result Date: 06/05/2023 CLINICAL DATA:  Provided history: Cerebrovascular accident, unspecified mechanism. Right-sided weakness. Right-sided facial droop. Altered mental status. EXAM: CT ANGIOGRAPHY HEAD AND NECK CT PERFUSION BRAIN TECHNIQUE: Multidetector CT imaging of the head and neck was performed using the standard protocol during bolus administration of intravenous contrast. Multiplanar CT image reconstructions and MIPs were obtained to evaluate the vascular anatomy. Carotid stenosis measurements (when applicable) are obtained utilizing NASCET criteria, using the distal internal carotid diameter as the denominator. Multiphase CT imaging of the brain was performed following IV bolus contrast injection. Subsequent parametric perfusion maps were calculated using RAPID software. RADIATION DOSE REDUCTION: This exam was performed according to the departmental dose-optimization program which includes automated exposure control, adjustment of  the mA and/or kV according to patient size and/or use of iterative reconstruction technique. CONTRAST:  OMNIPAQUE  IOHEXOL  350 MG/ML SOLN COMPARISON:  Noncontrast head CT performed earlier today 06/05/2023. MRA head and MRA neck 10/04/2019. FINDINGS: CTA NECK FINDINGS Aortic arch: Common origin of the innominate and left common carotid arteries. Atherosclerotic plaque within the visualized thoracic aorta and proximal major branch vessels of the neck. Streak/beam hardening artifact arising from a dense right-sided contrast bolus partially obscures the right subclavian artery. Within this limitation, there is no appreciable hemodynamically significant innominate or proximal subclavian artery stenosis. Right carotid system: CCA and ICA patent within the neck. Atherosclerotic plaque, greatest about the carotid bifurcation. Resultant 40% stenosis at the ICA origin. Left carotid system: CCA and ICA patent within the neck. Atherosclerotic plaque. Most notably, there is prominent atherosclerotic plaque about the carotid bifurcation and within the proximal ICA which has progressed from the prior MRA neck of 10/04/2019. Resultant severe (near occlusive) stenosis of the proximal ICA. Tortuosity of the cervical ICA Vertebral arteries: The vertebral arteries are patent within the neck. Streak/beam hardening artifact limits evaluation of the right vertebral artery origin. At least moderate stenosis is suspected at this site. Atherosclerotic plaque scattered elsewhere within the cervical right vertebral artery with no more than mild stenosis. Calcified atherosclerotic plaque at the left vertebral artery origin with suspected at least moderate stenosis. Nonstenotic calcified plaque elsewhere within the cervical left vertebral artery. Skeleton: Cervical spondylosis. Other neck: No neck mass or cervical lymphadenopathy. Upper chest: No consolidation within the imaged lung apices. Review of the MIP images confirms the above  findings CTA HEAD FINDINGS Anterior circulation: The intracranial internal carotid arteries are patent. As sclerotic plaque within both vessels. No more than mild stenosis on the right. Up to moderate stenosis within the left cavernous segment. The M1 middle cerebral arteries are patent. Abrupt occlusion of a proximal M2 left middle cerebral artery vessel (series 11, image 22). Atherosclerotic irregularity of the M2 and more distal MCA vessels elsewhere. The anterior cerebral arteries are patent. Atherosclerotic irregularity of both vessels without high-grade proximal stenosis. A possible 2 mm periophthalmic left ICA aneurysm with better appreciated on the prior MRA head of 10/04/2019. Posterior circulation: The intracranial vertebral arteries are patent. Atherosclerotic plaque within the right V4 segment sites of mild stenosis. Non-stenotic atherosclerotic plaque within the left V4 segment. The basilar artery is patent. The posterior cerebral arteries are patent. Posterior communicating arteries are diminutive or absent, bilaterally. Venous sinuses: Assessment for dural venous sinus thrombosis is limited due to contrast timing. Anatomic variants: As described. Review of the MIP images confirms the above findings CT Brain Perfusion Findings: ASPECTS: CBF (<30%) Volume: 41mL Perfusion (Tmax>6.0s) volume: 86mL  Mismatch Volume: 45mL Infarction Location:Left MCA vascular territory CTA head impression #1, the CT perfusion head impression and the presence of a severe stenosis of the proximal cervical left ICA called by telephone at the time of interpretation on 06/05/2023 at 8:40 am to provider ERIC [facility] , who verbally acknowledged these results. IMPRESSION: CTA neck: 1. No common carotid and internal carotid arteries are patent within the neck. Atherosclerotic plaque bilaterally. Most notably, there is progressive atherosclerotic plaque about the left carotid bifurcation and within the proximal left ICA with resultant  severe, near occlusive stenosis of the proximal left ICA. Also of note, atherosclerotic plaque about the right carotid bifurcation results in 40% stenosis at the right ICA origin. 2. The vertebral arteries are patent within the neck. Atherosclerotic plaque bilaterally as described. Most notably, there is suspected at least moderate stenoses at the bilateral vertebral artery origins. 3. Aortic Atherosclerosis (ICD10-I70.0). CTA head: 1. Abrupt occlusion of a proximal M2 left middle cerebral artery vessel. 2. Background intracranial atherosclerotic disease as described. 3. A possible 2 mm periophthalmic left ICA aneurysm was better appreciated on the prior MRA head of 10/04/2019. CT perfusion head: The perfusion software identifies a 41 mL core infarct in the left MCA vascular territory. The perfusion software identifies an 86 mL region of critically hypoperfused parenchyma within the left MCA vascular territory (utilizing the Tmax>6 seconds threshold). Reported mismatch volume: 45 mL Electronically Signed: By: Rockey Childs D.O. On: 06/05/2023 09:07   CT HEAD CODE STROKE WO CONTRAST Result Date: 06/05/2023 CLINICAL DATA:  Code stroke. Neuro deficit, acute, stroke suspected. EXAM: CT HEAD WITHOUT CONTRAST TECHNIQUE: Contiguous axial images were obtained from the base of the skull through the vertex without intravenous contrast. RADIATION DOSE REDUCTION: This exam was performed according to the departmental dose-optimization program which includes automated exposure control, adjustment of the mA and/or kV according to patient size and/or use of iterative reconstruction technique. COMPARISON:  Brain MRI 10/04/2019.  Noncontrast head CT 10/03/2019. FINDINGS: Brain: Generalized cerebral atrophy. Loss of gray-white differentiation consistent with an acute infarct within the left insula and within portions of the left frontal operculum (MCA vascular territory). Known small chronic cortically-based infarcts within the left  frontal, left parietal and left occipital lobes were better appreciated on the prior brain MRI of 10/04/2019 (acute at that time). Mild patchy and ill-defined hypoattenuation within the cerebral white matter, nonspecific but compatible with chronic small vessel ischemic disease. Subcentimeter infarct within the superior right cerebellar hemisphere, new from the prior MRI but chronic in appearance. Loss of gray-white differentiation there is no acute intracranial hemorrhage. No extra-axial fluid collection. No evidence of an intracranial mass. No midline shift. Vascular: No hyperdense vessel.  Atherosclerotic calcifications. Skull: No calvarial fracture or aggressive osseous lesion. Sinuses/Orbits: No mass or acute finding within the imaged orbits. No significant paranasal sinus disease. ASPECTS [facility] Stroke Program Early CT Score) - Ganglionic level infarction (caudate, lentiform nuclei, internal capsule, insula, M1-M3 cortex): 5 - Supraganglionic infarction (M4-M6 cortex): 2 Total score (0-10 with 10 being normal): 7 Impression #1 called by telephone at the time of interpretation on 06/05/2023 at 8:40 am to provider Dr. Merrianne, who verbally acknowledged these results. IMPRESSION: 1. Acute left MCA territory infarct affecting the left insula and portions of the left frontal operculum. ASPECTS is 7. 2. Known small chronic cortically-based infarcts within the left frontal, left parietal and left occipital lobes were better appreciated on the prior brain MRI of 10/04/2019 (acute at that time). 3. Background mild cerebral white  matter chronic small vessel ischemic disease. 4. Subcentimeter infarct within the right cerebellar hemisphere, new from prior MRI but chronic in appearance. 5. Generalized cerebral atrophy. Electronically Signed   By: Rockey Childs D.O.   On: 06/05/2023 08:45     Assessment and Plan:   Chronic HFrEF  - Patient previously had echocardiogram in 2021 showed EF 40-45%. Had a follow up nuclear  stress test that showed evidence of infarction. Cath was recommended, but patient was lost to follow up  - Patient currently admitted after CVA. Treated with thrombectomy, angioplasty.  - Echocardiogram yesterday revealed worsened LV function: LVEF 30%, LV moderately decreased function, mild LV hypertrophy, minimal MV regurgitation.  - Patient denies chest pain. He is euvolemic on exam today  - Resume jardiance  10 mg daily  - Start metoprolol  succinate 25 mg daily  - Considered starting losartan /entresto and spironolactone . However, patient currently normotensive with BP 118/64 off BP medications. I do not want to cause hypotension with his recent stroke. Could consider adding as BP tolerates  - Briefly discussed outpatient catheterization. Patient is fairly inactive in his daily life, and he denies any chest pain. Patient and family are willing to consider cath once he has recovered from his stroke. This is something we will continue to discuss in the outpatient setting   Hypertension  - BP 118/64 - Start metoprolol  succinate 25 mg daily  - Consider losartan /entresto, spiro as BP allows for CHF   NSVT  - Patient had a 13 beat run of NSVT, a few 4-5 beat runs of NSVT noted on telemetry  - Patient asymptomatic  - Starting metoprolol  succinate 25 mg daily  - K 5.0, mag 2.3 today   Hyperlipemia  - Lipid panel showed LDL 107  - Continue atorvastatin  80 mg daily   CKD Stage 3b - Cr 1.59 > 1.60 > 1.66 - GFR 46 >  43 > 41 - Resume jardiance  as above   Otherwise per primary  - CVA - currently on ASA, brilinta , lipitor . Will likely need 30 day monitor vs ILR at DC to rule out atrial fibrillation  - Type 2 DM   Risk Assessment/Risk Scores:    [place]  [employer] (NYHA) Functional Class NYHA Class I      For questions or updates, please contact [facility] Please consult [email or web address] for contact info under    Signed, Rollo FABIENE Louder, PA-C  06/06/2023 5:16 PM

## 2023-06-06 NOTE — Progress Notes (Signed)
 STROKE TEAM PROGRESS NOTE   SUBJECTIVE (INTERVAL HISTORY) His daughter and OT are at the bedside.  Overall his condition is gradually improving.  Patient sitting in chair, awake alert, still has intermittent coughing and moderate dysarthria.  Status post ICA stenting and thrombectomy, now on aspirin  and Brilinta .  However, patient has difficulty with co-pay for Brilinta , was switched to Plavix .  Holding   OBJECTIVE Temp:  [97.6 F (36.4 C)-99.2 F (37.3 C)] 98 F (36.7 C) (02/06 1600) Pulse Rate:  [58-73] 73 (02/06 1800) Cardiac Rhythm: Sinus bradycardia (02/06 0800) Resp:  [12-26] 26 (02/06 1800) BP: (102-150)/(54-110) 127/75 (02/06 1800) SpO2:  [92 %-100 %] 96 % (02/06 1800)  Recent Labs  Lab 06/05/23 2353 06/06/23 0344 06/06/23 0724 06/06/23 1100 06/06/23 1517  GLUCAP 132* 144* 165* 95 139*   Recent Labs  Lab 06/05/23 0815 06/05/23 0817 06/05/23 1500 06/05/23 1811 06/06/23 0557 06/06/23 1648  NA 142 142  --   --  139  --   K 3.8 3.7  --   --  5.0  --   CL 109 108  --   --  110  --   CO2 20*  --   --   --  16*  --   GLUCOSE 203* 201*  --   --  161*  --   BUN 18 21  --   --  23  --   CREATININE 1.59* 1.60*  --   --  1.66*  --   CALCIUM  9.4  --   --   --  8.7*  --   MG  --   --  1.8 1.9 2.3 2.2  PHOS  --   --  2.5 1.9* 3.0 3.4   Recent Labs  Lab 06/05/23 0815 06/06/23 0557  AST 20 26  ALT 13 12  ALKPHOS 76 72  BILITOT 1.2 1.1  PROT 6.5 6.1*  ALBUMIN  3.6 3.3*   Recent Labs  Lab 06/05/23 0815 06/05/23 0817 06/06/23 0557  WBC 6.8  --  10.0  NEUTROABS 3.1  --   --   HGB 11.9* 12.6* 11.4*  HCT 37.8* 37.0* 36.3*  MCV 97.4  --  97.6  PLT 181  --  177   No results for input(s): CKTOTAL, CKMB, CKMBINDEX, TROPONINI in the last 168 hours. Recent Labs    06/05/23 0815 06/06/23 0557  LABPROT 13.7 16.2*  INR 1.0 1.3*   Recent Labs    06/05/23 1653  COLORURINE YELLOW  LABSPEC >1.046*  PHURINE 5.0  GLUCOSEU NEGATIVE  HGBUR NEGATIVE   BILIRUBINUR NEGATIVE  KETONESUR 5*  PROTEINUR NEGATIVE  NITRITE NEGATIVE  LEUKOCYTESUR NEGATIVE       Component Value Date/Time   CHOL 183 06/06/2023 0557   TRIG 81 06/06/2023 0557   HDL 60 06/06/2023 0557   CHOLHDL 3.1 06/06/2023 0557   VLDL 16 06/06/2023 0557   LDLCALC 107 (H) 06/06/2023 0557   LDLCALC 223 (H) 05/31/2021 0000   Lab Results  Component Value Date   HGBA1C 6.1 (H) 06/05/2023      Component Value Date/Time   LABOPIA NONE DETECTED 06/05/2023 1653   COCAINSCRNUR NONE DETECTED 06/05/2023 1653   LABBENZ NONE DETECTED 06/05/2023 1653   AMPHETMU NONE DETECTED 06/05/2023 1653   THCU NONE DETECTED 06/05/2023 1653   LABBARB NONE DETECTED 06/05/2023 1653    Recent Labs  Lab 06/05/23 0815  ETH <10    I have personally reviewed the radiological images below and agree with the radiology interpretations.  MR BRAIN WO CONTRAST Result Date: 06/06/2023 CLINICAL DATA:  Follow-up examination for stroke. EXAM: MRI HEAD WITHOUT CONTRAST TECHNIQUE: Multiplanar, multiecho pulse sequences of the brain and surrounding structures were obtained without intravenous contrast. COMPARISON:  Comparison made to multiple previous exams from 06/05/2023. FINDINGS: Brain: Examination degraded by motion artifact. Cerebral volume within normal limits for age. Patchy T2/FLAIR hyperintensity involving the periventricular deep white matter both cerebral hemispheres, consistent with chronic small vessel ischemic disease, mild in nature. Small remote infarct noted within the right cerebellum. Confluent restricted diffusion involving the left insula and left frontal operculum, consistent with evolving acute left MCA distribution infarct. Area of infarction measures up to approximately 6 cm in AP diameter. Prominent associated susceptibility artifact, consistent with petechial hemorrhage (series 7, image 61) ( Heidelberg classification 1b: HI2, confluent petechiae, no mass effect. No frank or organized  hematoma evident by MRI. No significant regional mass effect. Few additional small foci of infarction noted within the left parieto-occipital region posteriorly (series 3, images 40, 29). Apparent diffusion signal along the right frontal parafalcine region felt to be consistent with artifact related to dural calcification. Note made of a few additional chronic micro hemorrhages within the left cerebellum and right thalamus. No mass lesion or midline shift. Ventricles normal size without hydrocephalus. No extra-axial fluid collection. Pituitary gland suprasellar region within normal limits. Vascular: Major intracranial vascular flow voids are maintained. Skull and upper cervical spine: Craniocervical junction within normal limits. Bone marrow signal intensity overall within normal limits. No scalp soft tissue abnormality. Sinuses/Orbits: Prior bilateral ocular lens replacement. Paranasal sinuses are clear. No mastoid effusion. Other: None. IMPRESSION: 1. Evolving acute left MCA distribution infarct, most pronounced at the left insula and left frontal operculum. Prominent associated petechial hemorrhage without frank intraparenchymal hematoma (Heidelberg classification 1b: HI2, confluent petechiae, no mass effect). 2. Few additional small foci of acute infarction within the left parieto-occipital region posteriorly. 3. Underlying mild chronic microvascular ischemic disease with small remote right cerebellar infarct. Electronically Signed   By: Morene Hoard M.D.   On: 06/06/2023 03:12   ECHOCARDIOGRAM COMPLETE Result Date: 06/05/2023    ECHOCARDIOGRAM REPORT   Patient Name:   Nathan Proctor. Date of Exam: 06/05/2023 Medical Rec #:  [id number]              Height:       70.0 in Accession #:    [id number]             Weight:       292.3 lb Date of Birth:  1940/08/17              BSA:          2.453 m Patient Age:    82 years               BP:           129/72 mmHg Patient Gender: M                      HR:            89 bpm. Exam Location:  Inpatient Procedure: 2D Echo, Color Doppler, Cardiac Doppler and Intracardiac            Opacification Agent Indications:    Stroke I63.9  History:        Patient has prior history of Echocardiogram examinations, most  recent 10/06/2019. Risk Factors:Diabetes, Hypertension and                 Dyslipidemia.  Sonographer:    Tinnie Gosling RDCS Referring Phys: ERIN C LEHNER IMPRESSIONS  1. Left ventricular ejection fraction, by estimation, is 30%. The left ventricle has moderately decreased function. The left ventricle demonstrates global hypokinesis. There is mild left ventricular hypertrophy.  2. Right ventricular systolic function is normal. The right ventricular size is normal.  3. Trivial mitral valve regurgitation.  4. The aortic valve is tricuspid. Aortic valve regurgitation is not visualized.  5. The inferior vena cava is normal in size with greater than 50% respiratory variability, suggesting right atrial pressure of 3 mmHg. Comparison(s): The left ventricular function is worsened. FINDINGS  Left Ventricle: Left ventricular ejection fraction, by estimation, is 30%. The left ventricle has moderately decreased function. The left ventricle demonstrates global hypokinesis. Definity  contrast agent was given IV to delineate the left ventricular endocardial borders. The left ventricular internal cavity size was normal in size. There is mild left ventricular hypertrophy. Right Ventricle: The right ventricular size is normal. Right vetricular wall thickness was not assessed. Right ventricular systolic function is normal. Left Atrium: Left atrial size was normal in size. Right Atrium: Right atrial size was normal in size. Pericardium: There is no evidence of pericardial effusion. Mitral Valve: There is mild thickening of the mitral valve leaflet(s). Mild to moderate mitral annular calcification. Trivial mitral valve regurgitation. Tricuspid Valve: The tricuspid valve is  normal in structure. Tricuspid valve regurgitation is trivial. Aortic Valve: The aortic valve is tricuspid. Aortic valve regurgitation is not visualized. Pulmonic Valve: The pulmonic valve was normal in structure. Pulmonic valve regurgitation is not visualized. Aorta: The aortic root and ascending aorta are structurally normal, with no evidence of dilitation. Venous: The inferior vena cava is normal in size with greater than 50% respiratory variability, suggesting right atrial pressure of 3 mmHg. IAS/Shunts: The interatrial septum was not assessed.  LEFT VENTRICLE PLAX 2D LVIDd:         5.00 cm   Diastology LVIDs:         4.40 cm   LV e' lateral: 5.33 cm/s LV PW:         1.20 cm LV IVS:        1.10 cm LVOT diam:     2.30 cm LV SV:         55 LV SV Index:   23 LVOT Area:     4.15 cm  RIGHT VENTRICLE RV S prime:     14.40 cm/s LEFT ATRIUM         Index LA diam:    4.60 cm 1.88 cm/m  AORTIC VALVE LVOT Vmax:   65.50 cm/s LVOT Vmean:  45.200 cm/s LVOT VTI:    0.133 m  AORTA Ao Root diam: 3.00 cm Ao Asc diam:  3.50 cm  SHUNTS Systemic VTI:  0.13 m Systemic Diam: 2.30 cm Vina Gull MD Electronically signed by Vina Gull MD Signature Date/Time: 06/05/2023/9:33:38 PM    Final    DG Abd Portable 1V Result Date: 06/05/2023 CLINICAL DATA:  Feeding tube placement. EXAM: PORTABLE ABDOMEN - 1 VIEW COMPARISON:  None Available. FINDINGS: Distal tip of feeding tube is seen in expected position of distal stomach. IMPRESSION: Distal tip of feeding tube seen in expected position of distal stomach. Electronically Signed   By: Lynwood Landy Raddle M.D.   On: 06/05/2023 15:49   CT HEAD WO CONTRAST Result  Date: 06/05/2023 CLINICAL DATA:  Stroke, follow-up. Status post intracranial mechanical thrombectomy and stenting of the left ICA bifurcation. EXAM: CT HEAD WITHOUT CONTRAST TECHNIQUE: Contiguous axial images were obtained from the base of the skull through the vertex without intravenous contrast. RADIATION DOSE REDUCTION: This exam was  performed according to the departmental dose-optimization program which includes automated exposure control, adjustment of the mA and/or kV according to patient size and/or use of iterative reconstruction technique. COMPARISON:  CT head without contrast and CT angio head and neck 06/05/2023. By plain CT 06/05/2023. FINDINGS: Brain: The study is mildly degraded by patient motion. The left insular and left opercular infarct is somewhat obscured by patient motion. The cortex is slightly hyperdense, potentially reflecting reperfusion. No hemorrhage is present. Basal ganglia are intact. Subcortical white matter hypoattenuation in the high left frontal lobe is new. Mild right-sided white matter disease is stable. Basal ganglia are intact. A remote lacunar infarct is again noted in the right cerebellum. The brainstem and cerebellum are otherwise within normal limits. Vascular: Atherosclerotic calcifications are present within the cavernous internal carotid arteries and at the normal origin of both vertebral arteries. No hyperdense vessel is present. Skull: Calvarium is intact. No focal lytic or blastic lesions are present. No significant extracranial soft tissue lesion is present. Sinuses/Orbits: The paranasal sinuses and mastoid air cells are clear. Bilateral lens replacements are noted. Globes and orbits are otherwise unremarkable. IMPRESSION: 1. The left insular and left opercular infarct is somewhat obscured by patient motion. 2. The cortex is slightly hyperdense, potentially reflecting reperfusion. 3. No hemorrhage. 4. Subcortical white matter hypoattenuation in the high left frontal lobe is new. This may reflect ischemic changes or edema related to the reperfusion. 5. Remote lacunar infarct of the right cerebellum. 6. Stable mild right-sided white matter disease. This likely reflects the sequela of chronic microvascular ischemia. Electronically Signed   By: Lonni Necessary M.D.   On: 06/05/2023 14:38   IR  PERCUTANEOUS ART THROMBECTOMY/INFUSION INTRACRANIAL INC DIAG ANGIO Result Date: 06/05/2023 INDICATION: 83 year old male presenting with right-sided weakness and aphasia; NIHSS 28. His last known well was 10 p.m. on 06/04/2023. His past medical history significant for prior stroke, hypertension, diabetes and chronic kidney disease; baseline modified Rankin scale 0. Head CT showed hypodensity within the left insula, basal ganglia and left frontal operculum (ASPECTS 7). No IV thrombolytic given as patient was outside the window. CT angiogram of the head and neck showed an occlusion of a left M2/MCA anterior division branch. CT perfusion showed a 41 mL core infarct with a 45 mL ischemic penumbra. She was transferred to our service for mechanical thrombectomy. EXAM: ULTRASOUND-GUIDED VASCULAR ACCESS DIAGNOSTIC CEREBRAL ANGIOGRAM MECHANICAL THROMBECTOMY FLAT PANEL HEAD CT LEFT CAROTID STENTING AND ANGIOPLASTY WITHOUT CEREBRAL PROTECTION DEVICE COMPARISON:  CT/CT angiogram of the head and neck June 05, 2023. MEDICATIONS: No antibiotics administered. ANESTHESIA/SEDATION: The procedure was performed under general anesthesia. CONTRAST:  80 mL of Omnipaque  300 milligram/mL FLUOROSCOPY: Radiation Exposure Index (as provided by the fluoroscopic device): 1315 mGy Kerma COMPLICATIONS: None immediate. TECHNIQUE: Informed written consent was obtained from the patient's wife after a thorough discussion of the procedural risks, benefits and alternatives. All questions were addressed. Maximal Sterile Barrier Technique was utilized including caps, mask, sterile gowns, sterile gloves, sterile drape, hand hygiene and skin antiseptic. A timeout was performed prior to the initiation of the procedure. The right groin was prepped and draped in the usual sterile fashion. Using a micropuncture kit and the modified Seldinger technique, access was gained  to the right common femoral artery and an 8 French sheath was placed. Real-time ultrasound  guidance was utilized for vascular access including the acquisition of a permanent ultrasound image documenting patency of the accessed vessel. Under fluoroscopy, an 8 French Walrus balloon guide catheter was navigated over a 6 French VTK catheter and a 0.035 Terumo Glidewire into the aortic arch. The catheter was placed into the left common carotid artery and then advanced into the left internal carotid artery. The diagnostic catheter was removed. Frontal and lateral angiograms of the head were obtained. FINDINGS: 1. Ultrasound showed heavily calcified right common femoral artery is maintained patency and caliber. 2. Proximal occlusion of a left M2/MCA anterior division branch. 3. Atherosclerotic changes of the intracranial left ICA with mild stenosis at the distal cavernous segment. 4. 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 PROCEDURE: Using biplane roadmap guidance, a Red 62 aspiration catheter was navigated over Colossus 35 microguidewire into the cavernous segment of the left ICA. The aspiration catheter was then advanced to the level of occlusion and connected to an aspiration pump. Continuous aspiration was performed for 2 minutes. The guide catheter was connected to a VacLok syringe and the guiding catheter balloon was inflated. The aspiration catheter was subsequently removed under constant aspiration. The guide catheter was aspirated for debris. Left internal carotid artery angiograms with frontal and lateral views of the head showed complete recanalization of the left MCA vascular tree. The guide catheter was retracted into the neck. Frontal and lateral angiograms of the neck were obtained. Improvement of the degree of stenosis in the carotid bulb compared to prior CT angiogram. There is prominent luminal irregularity at the carotid bulb residual moderate stenosis. Increased tortuosity of the  proximal/mid cervical left ICA with kinking. Left internal carotid artery angiograms with left anterior oblique views of the neck showed evidence of filling defects at the level of the carotid bulb. Flat panel CT of the head was obtained and post processed in a separate workstation with concurrent attending physician supervision. Selected images were sent to PACS. No evidence of hemorrhagic complication. There is mild contrast staining of the left insular and frontal cortex. Repeat left internal carotid artery angiograms with frontal and lateral views of the head showed Amy seen left M3/MCA branch to the left parietal region. Left common carotid artery angiograms with frontal and lateral views of the neck showed vertebra Gretchen of stenosis at the left ICA bulb with more prominent filling defect. At this point, patient was loaded on cangrelor  followed by continuous drip. Using biplane roadmap guidance, a 4-7 mm Emboshield NAV6 cerebral protection device was advanced into the cervical left ICA. However, multiple attempts to advance a cerebral protection device through the left ICA kinking proved unsuccessful. The cerebral protection device was subsequently removed. Using biplane roadmap guidance, a 10-8 x 40 mm XACT carotid stent was navigated and deployed from the distal left common carotid artery to the proximal left internal carotid artery, proximal to the vessel kinking. Suboptimal stent expansion was noted. Then, a 6 x 30 mm Viatrac balloon was navigated into the recently deployed stent. Angioplasty was performed under fluoroscopy. Left internal carotid artery angiograms with frontal and lateral views of the neck showed adequate stent positioning and expansion with brisk anterograde flow. Left internal carotid artery angiograms with frontal and lateral views of the head showed improvement of anterograde flow in the left MCA vascular tree with  brisk anterograde flow. Delayed left common carotid artery angiograms  with frontal and lateral views of the neck showed no evidence of clot formation within the stent. The catheter was subsequently Riddle. Right common femoral artery angiogram was obtained in right anterior oblique view. The puncture is at the level of the common femoral artery. The artery has normal caliber, adequate for closure device. The sheath was exchanged over the wire for an 8 French Angio-Seal which was utilized for access closure. Immediate hemostasis was achieved. IMPRESSION: 1. Successful mechanical thrombectomy for treatment of a proximal left M2/MCA anterior division branch occlusion achieving complete recanalization (TICI 3). 2. 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 PLAN: Continue cangrelor  infusion until patient is transitioned to oral dual antiplatelet therapy. Electronically Signed   By: Katyucia  de Macedo Rodrigues M.D.   On: 06/05/2023 13:16   IR US  Guide Vasc Access Right Result Date: 06/05/2023 INDICATION: 83 year old male presenting with right-sided weakness and aphasia; NIHSS 28. His last known well was 10 p.m. on 06/04/2023. His past medical history significant for prior stroke, hypertension, diabetes and chronic kidney disease; baseline modified Rankin scale 0. Head CT showed hypodensity within the left insula, basal ganglia and left frontal operculum (ASPECTS 7). No IV thrombolytic given as patient was outside the window. CT angiogram of the head and neck showed an occlusion of a left M2/MCA anterior division branch. CT perfusion showed a 41 mL core infarct with a 45 mL ischemic penumbra. She was transferred to our service for mechanical thrombectomy. EXAM: ULTRASOUND-GUIDED VASCULAR ACCESS DIAGNOSTIC CEREBRAL ANGIOGRAM MECHANICAL THROMBECTOMY FLAT PANEL HEAD CT LEFT CAROTID STENTING AND ANGIOPLASTY WITHOUT CEREBRAL PROTECTION DEVICE  COMPARISON:  CT/CT angiogram of the head and neck June 05, 2023. MEDICATIONS: No antibiotics administered. ANESTHESIA/SEDATION: The procedure was performed under general anesthesia. CONTRAST:  80 mL of Omnipaque  300 milligram/mL FLUOROSCOPY: Radiation Exposure Index (as provided by the fluoroscopic device): 1315 mGy Kerma COMPLICATIONS: None immediate. TECHNIQUE: Informed written consent was obtained from the patient's wife after a thorough discussion of the procedural risks, benefits and alternatives. All questions were addressed. Maximal Sterile Barrier Technique was utilized including caps, mask, sterile gowns, sterile gloves, sterile drape, hand hygiene and skin antiseptic. A timeout was performed prior to the initiation of the procedure. The right groin was prepped and draped in the usual sterile fashion. Using a micropuncture kit and the modified Seldinger technique, access was gained to the right common femoral artery and an 8 French sheath was placed. Real-time ultrasound guidance was utilized for vascular access including the acquisition of a permanent ultrasound image documenting patency of the accessed vessel. Under fluoroscopy, an 8 French Walrus balloon guide catheter was navigated over a 6 French VTK catheter and a 0.035 Terumo Glidewire into the aortic arch. The catheter was placed into the left common carotid artery and then advanced into the left internal carotid artery. The diagnostic catheter was removed. Frontal and lateral angiograms of the head were obtained. FINDINGS: 1. Ultrasound showed heavily calcified right common femoral artery is maintained patency and caliber. 2. Proximal occlusion of a left M2/MCA anterior division branch. 3. Atherosclerotic changes of the intracranial left ICA with mild stenosis at the distal cavernous segment. 4. 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  PROCEDURE: Using biplane roadmap guidance, a Red 62 aspiration catheter was  navigated over Colossus 35 microguidewire into the cavernous segment of the left ICA. The aspiration catheter was then advanced to the level of occlusion and connected to an aspiration pump. Continuous aspiration was performed for 2 minutes. The guide catheter was connected to a VacLok syringe and the guiding catheter balloon was inflated. The aspiration catheter was subsequently removed under constant aspiration. The guide catheter was aspirated for debris. Left internal carotid artery angiograms with frontal and lateral views of the head showed complete recanalization of the left MCA vascular tree. The guide catheter was retracted into the neck. Frontal and lateral angiograms of the neck were obtained. Improvement of the degree of stenosis in the carotid bulb compared to prior CT angiogram. There is prominent luminal irregularity at the carotid bulb residual moderate stenosis. Increased tortuosity of the proximal/mid cervical left ICA with kinking. Left internal carotid artery angiograms with left anterior oblique views of the neck showed evidence of filling defects at the level of the carotid bulb. Flat panel CT of the head was obtained and post processed in a separate workstation with concurrent attending physician supervision. Selected images were sent to PACS. No evidence of hemorrhagic complication. There is mild contrast staining of the left insular and frontal cortex. Repeat left internal carotid artery angiograms with frontal and lateral views of the head showed Amy seen left M3/MCA branch to the left parietal region. Left common carotid artery angiograms with frontal and lateral views of the neck showed vertebra Gretchen of stenosis at the left ICA bulb with more prominent filling defect. At this point, patient was loaded on cangrelor  followed by continuous drip. Using biplane roadmap guidance, a 4-7 mm Emboshield NAV6 cerebral  protection device was advanced into the cervical left ICA. However, multiple attempts to advance a cerebral protection device through the left ICA kinking proved unsuccessful. The cerebral protection device was subsequently removed. Using biplane roadmap guidance, a 10-8 x 40 mm XACT carotid stent was navigated and deployed from the distal left common carotid artery to the proximal left internal carotid artery, proximal to the vessel kinking. Suboptimal stent expansion was noted. Then, a 6 x 30 mm Viatrac balloon was navigated into the recently deployed stent. Angioplasty was performed under fluoroscopy. Left internal carotid artery angiograms with frontal and lateral views of the neck showed adequate stent positioning and expansion with brisk anterograde flow. Left internal carotid artery angiograms with frontal and lateral views of the head showed improvement of anterograde flow in the left MCA vascular tree with brisk anterograde flow. Delayed left common carotid artery angiograms with frontal and lateral views of the neck showed no evidence of clot formation within the stent. The catheter was subsequently Riddle. Right common femoral artery angiogram was obtained in right anterior oblique view. The puncture is at the level of the common femoral artery. The artery has normal caliber, adequate for closure device. The sheath was exchanged over the wire for an 8 French Angio-Seal which was utilized for access closure. Immediate hemostasis was achieved. IMPRESSION: 1. Successful mechanical thrombectomy for treatment of a proximal left M2/MCA anterior division branch occlusion achieving complete recanalization (TICI 3). 2. 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 PLAN: Continue cangrelor  infusion until patient is transitioned to oral dual antiplatelet therapy.  Electronically Signed   By: Katyucia  de Macedo Rodrigues M.D.   On: 06/05/2023 13:16   IR INTRAVSC STENT CERV CAROTID W/O EMB-PROT  MOD SED Result Date: 06/05/2023 INDICATION: 83 year old male presenting with right-sided weakness and aphasia; NIHSS 28. His last known well was 10 p.m. on 06/04/2023. His past medical history significant for prior stroke, hypertension, diabetes and chronic kidney disease; baseline modified Rankin scale 0. Head CT showed hypodensity within the left insula, basal ganglia and left frontal operculum (ASPECTS 7). No IV thrombolytic given as patient was outside the window. CT angiogram of the head and neck showed an occlusion of a left M2/MCA anterior division branch. CT perfusion showed a 41 mL core infarct with a 45 mL ischemic penumbra. She was transferred to our service for mechanical thrombectomy. EXAM: ULTRASOUND-GUIDED VASCULAR ACCESS DIAGNOSTIC CEREBRAL ANGIOGRAM MECHANICAL THROMBECTOMY FLAT PANEL HEAD CT LEFT CAROTID STENTING AND ANGIOPLASTY WITHOUT CEREBRAL PROTECTION DEVICE COMPARISON:  CT/CT angiogram of the head and neck June 05, 2023. MEDICATIONS: No antibiotics administered. ANESTHESIA/SEDATION: The procedure was performed under general anesthesia. CONTRAST:  80 mL of Omnipaque  300 milligram/mL FLUOROSCOPY: Radiation Exposure Index (as provided by the fluoroscopic device): 1315 mGy Kerma COMPLICATIONS: None immediate. TECHNIQUE: Informed written consent was obtained from the patient's wife after a thorough discussion of the procedural risks, benefits and alternatives. All questions were addressed. Maximal Sterile Barrier Technique was utilized including caps, mask, sterile gowns, sterile gloves, sterile drape, hand hygiene and skin antiseptic. A timeout was performed prior to the initiation of the procedure. The right groin was prepped and draped in the usual sterile fashion. Using a micropuncture kit and the modified Seldinger technique, access was gained to the right  common femoral artery and an 8 French sheath was placed. Real-time ultrasound guidance was utilized for vascular access including the acquisition of a permanent ultrasound image documenting patency of the accessed vessel. Under fluoroscopy, an 8 French Walrus balloon guide catheter was navigated over a 6 French VTK catheter and a 0.035 Terumo Glidewire into the aortic arch. The catheter was placed into the left common carotid artery and then advanced into the left internal carotid artery. The diagnostic catheter was removed. Frontal and lateral angiograms of the head were obtained. FINDINGS: 1. Ultrasound showed heavily calcified right common femoral artery is maintained patency and caliber. 2. Proximal occlusion of a left M2/MCA anterior division branch. 3. Atherosclerotic changes of the intracranial left ICA with mild stenosis at the distal cavernous segment. 4. 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 PROCEDURE: Using biplane roadmap guidance, a Red 62 aspiration catheter was navigated over Colossus 35 microguidewire into the cavernous segment of the left ICA. The aspiration catheter was then advanced to the level of occlusion and connected to an aspiration pump. Continuous aspiration was performed for 2 minutes. The guide catheter was connected to a VacLok syringe and the guiding catheter balloon was inflated. The aspiration catheter was subsequently removed under constant aspiration. The guide catheter was aspirated for debris. Left internal carotid artery angiograms with frontal and lateral views of the head showed complete recanalization of the left MCA vascular tree. The guide catheter was retracted into the neck. Frontal and lateral angiograms of the neck were obtained. Improvement of the degree of stenosis in the carotid bulb compared to prior CT angiogram. There is prominent luminal irregularity at  the carotid bulb residual moderate stenosis. Increased tortuosity of the proximal/mid cervical left ICA with kinking. Left internal carotid artery angiograms with left anterior oblique views of the neck showed evidence of filling defects at the level of the  carotid bulb. Flat panel CT of the head was obtained and post processed in a separate workstation with concurrent attending physician supervision. Selected images were sent to PACS. No evidence of hemorrhagic complication. There is mild contrast staining of the left insular and frontal cortex. Repeat left internal carotid artery angiograms with frontal and lateral views of the head showed Amy seen left M3/MCA branch to the left parietal region. Left common carotid artery angiograms with frontal and lateral views of the neck showed vertebra Gretchen of stenosis at the left ICA bulb with more prominent filling defect. At this point, patient was loaded on cangrelor  followed by continuous drip. Using biplane roadmap guidance, a 4-7 mm Emboshield NAV6 cerebral protection device was advanced into the cervical left ICA. However, multiple attempts to advance a cerebral protection device through the left ICA kinking proved unsuccessful. The cerebral protection device was subsequently removed. Using biplane roadmap guidance, a 10-8 x 40 mm XACT carotid stent was navigated and deployed from the distal left common carotid artery to the proximal left internal carotid artery, proximal to the vessel kinking. Suboptimal stent expansion was noted. Then, a 6 x 30 mm Viatrac balloon was navigated into the recently deployed stent. Angioplasty was performed under fluoroscopy. Left internal carotid artery angiograms with frontal and lateral views of the neck showed adequate stent positioning and expansion with brisk anterograde flow. Left internal carotid artery angiograms with frontal and lateral views of the head showed improvement of anterograde flow in the left MCA vascular tree  with brisk anterograde flow. Delayed left common carotid artery angiograms with frontal and lateral views of the neck showed no evidence of clot formation within the stent. The catheter was subsequently Riddle. Right common femoral artery angiogram was obtained in right anterior oblique view. The puncture is at the level of the common femoral artery. The artery has normal caliber, adequate for closure device. The sheath was exchanged over the wire for an 8 French Angio-Seal which was utilized for access closure. Immediate hemostasis was achieved. IMPRESSION: 1. Successful mechanical thrombectomy for treatment of a proximal left M2/MCA anterior division branch occlusion achieving complete recanalization (TICI 3). 2. 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 PLAN: Continue cangrelor  infusion until patient is transitioned to oral dual antiplatelet therapy. Electronically Signed   By: Katyucia  de Macedo Rodrigues M.D.   On: 06/05/2023 13:16   IR CT Head Ltd Result Date: 06/05/2023 INDICATION: 83 year old male presenting with right-sided weakness and aphasia; NIHSS 28. His last known well was 10 p.m. on 06/04/2023. His past medical history significant for prior stroke, hypertension, diabetes and chronic kidney disease; baseline modified Rankin scale 0. Head CT showed hypodensity within the left insula, basal ganglia and left frontal operculum (ASPECTS 7). No IV thrombolytic given as patient was outside the window. CT angiogram of the head and neck showed an occlusion of a left M2/MCA anterior division branch. CT perfusion showed a 41 mL core infarct with a 45 mL ischemic penumbra. She was transferred to our service for mechanical thrombectomy. EXAM: ULTRASOUND-GUIDED VASCULAR ACCESS DIAGNOSTIC CEREBRAL ANGIOGRAM MECHANICAL THROMBECTOMY FLAT PANEL HEAD CT LEFT CAROTID  STENTING AND ANGIOPLASTY WITHOUT CEREBRAL PROTECTION DEVICE COMPARISON:  CT/CT angiogram of the head and neck June 05, 2023. MEDICATIONS: No antibiotics administered. ANESTHESIA/SEDATION: The procedure was performed under general anesthesia. CONTRAST:  80 mL of Omnipaque  300 milligram/mL FLUOROSCOPY: Radiation Exposure Index (as provided by the fluoroscopic  device): 1315 mGy Kerma COMPLICATIONS: None immediate. TECHNIQUE: Informed written consent was obtained from the patient's wife after a thorough discussion of the procedural risks, benefits and alternatives. All questions were addressed. Maximal Sterile Barrier Technique was utilized including caps, mask, sterile gowns, sterile gloves, sterile drape, hand hygiene and skin antiseptic. A timeout was performed prior to the initiation of the procedure. The right groin was prepped and draped in the usual sterile fashion. Using a micropuncture kit and the modified Seldinger technique, access was gained to the right common femoral artery and an 8 French sheath was placed. Real-time ultrasound guidance was utilized for vascular access including the acquisition of a permanent ultrasound image documenting patency of the accessed vessel. Under fluoroscopy, an 8 French Walrus balloon guide catheter was navigated over a 6 French VTK catheter and a 0.035 Terumo Glidewire into the aortic arch. The catheter was placed into the left common carotid artery and then advanced into the left internal carotid artery. The diagnostic catheter was removed. Frontal and lateral angiograms of the head were obtained. FINDINGS: 1. Ultrasound showed heavily calcified right common femoral artery is maintained patency and caliber. 2. Proximal occlusion of a left M2/MCA anterior division branch. 3. Atherosclerotic changes of the intracranial left ICA with mild stenosis at the distal cavernous segment. 4. A 2-3 mm laterally projecting saccular aneurysm of the cavernous segment of the left ICA  (extradural), similar to prior MR angiogram performed 2021. PROCEDURE: Using biplane roadmap guidance, a Red 62 aspiration catheter was navigated over Colossus 35 microguidewire into the cavernous segment of the left ICA. The aspiration catheter was then advanced to the level of occlusion and connected to an aspiration pump. Continuous aspiration was performed for 2 minutes. The guide catheter was connected to a VacLok syringe and the guiding catheter balloon was inflated. The aspiration catheter was subsequently removed under constant aspiration. The guide catheter was aspirated for debris. Left internal carotid artery angiograms with frontal and lateral views of the head showed complete recanalization of the left MCA vascular tree. The guide catheter was retracted into the neck. Frontal and lateral angiograms of the neck were obtained. Improvement of the degree of stenosis in the carotid bulb compared to prior CT angiogram. There is prominent luminal irregularity at the carotid bulb residual moderate stenosis. Increased tortuosity of the proximal/mid cervical left ICA with kinking. Left internal carotid artery angiograms with left anterior oblique views of the neck showed evidence of filling defects at the level of the carotid bulb. Flat panel CT of the head was obtained and post processed in a separate workstation with concurrent attending physician supervision. Selected images were sent to PACS. No evidence of hemorrhagic complication. There is mild contrast staining of the left insular and frontal cortex. Repeat left internal carotid artery angiograms with frontal and lateral views of the head showed Amy seen left M3/MCA branch to the left parietal region. Left common carotid artery angiograms with frontal and lateral views of the neck showed vertebra Gretchen of stenosis at the left ICA bulb with more prominent filling defect. At this point, patient was loaded on cangrelor  followed by continuous drip. Using  biplane roadmap guidance, a 4-7 mm Emboshield NAV6 cerebral protection device was advanced into the cervical left ICA. However, multiple attempts to advance a cerebral protection device through the left ICA kinking proved unsuccessful. The cerebral protection device was subsequently removed. Using biplane roadmap guidance, a 10-8 x 40 mm XACT carotid stent was navigated and deployed from the distal left common  carotid artery to the proximal left internal carotid artery, proximal to the vessel kinking. Suboptimal stent expansion was noted. Then, a 6 x 30 mm Viatrac balloon was navigated into the recently deployed stent. Angioplasty was performed under fluoroscopy. Left internal carotid artery angiograms with frontal and lateral views of the neck showed adequate stent positioning and expansion with brisk anterograde flow. Left internal carotid artery angiograms with frontal and lateral views of the head showed improvement of anterograde flow in the left MCA vascular tree with brisk anterograde flow. Delayed left common carotid artery angiograms with frontal and lateral views of the neck showed no evidence of clot formation within the stent. The catheter was subsequently Riddle. Right common femoral artery angiogram was obtained in right anterior oblique view. The puncture is at the level of the common femoral artery. The artery has normal caliber, adequate for closure device. The sheath was exchanged over the wire for an 8 French Angio-Seal which was utilized for access closure. Immediate hemostasis was achieved. IMPRESSION: 1. Successful mechanical thrombectomy for treatment of a proximal left M2/MCA anterior division branch occlusion achieving complete recanalization (TICI 3). 2. 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 PLAN: Continue cangrelor  infusion until patient is  transitioned to oral dual antiplatelet therapy. Electronically Signed   By: Katyucia  de Macedo Rodrigues M.D.   On: 06/05/2023 13:16   CT ANGIO HEAD NECK W WO CM W PERF (CODE STROKE) Addendum Date: 06/05/2023 ADDENDUM REPORT: 06/05/2023 12:25 ADDENDUM: Please note, there is a dictation error within CTA neck impression #1, which should read: The common carotid and internal carotid arteries are patent within the neck. Atherosclerotic plaque bilaterally. Most notably, there is progressive atherosclerotic plaque about the left carotid bifurcation and within the proximal left ICA with resultant severe near occlusive stenosis of the proximal left ICA. Also of note, atherosclerotic plaque about the right carotid bifurcation results in a 40% stenosis at the right ICA origin. Electronically Signed   By: Rockey Childs D.O.   On: 06/05/2023 12:25   Result Date: 06/05/2023 CLINICAL DATA:  Provided history: Cerebrovascular accident, unspecified mechanism. Right-sided weakness. Right-sided facial droop. Altered mental status. EXAM: CT ANGIOGRAPHY HEAD AND NECK CT PERFUSION BRAIN TECHNIQUE: Multidetector CT imaging of the head and neck was performed using the standard protocol during bolus administration of intravenous contrast. Multiplanar CT image reconstructions and MIPs were obtained to evaluate the vascular anatomy. Carotid stenosis measurements (when applicable) are obtained utilizing NASCET criteria, using the distal internal carotid diameter as the denominator. Multiphase CT imaging of the brain was performed following IV bolus contrast injection. Subsequent parametric perfusion maps were calculated using RAPID software. RADIATION DOSE REDUCTION: This exam was performed according to the departmental dose-optimization program which includes automated exposure control, adjustment of the mA and/or kV according to patient size and/or use of iterative reconstruction technique. CONTRAST:  OMNIPAQUE  IOHEXOL  350 MG/ML  SOLN COMPARISON:  Noncontrast head CT performed earlier today 06/05/2023. MRA head and MRA neck 10/04/2019. FINDINGS: CTA NECK FINDINGS Aortic arch: Common origin of the innominate and left common carotid arteries. Atherosclerotic plaque within the visualized thoracic aorta and proximal major branch vessels of the neck. Streak/beam hardening artifact arising from a dense right-sided contrast bolus partially obscures the right subclavian artery. Within this limitation, there is no appreciable hemodynamically significant innominate or proximal subclavian artery stenosis. Right carotid system: CCA and ICA patent within the neck. Atherosclerotic  plaque, greatest about the carotid bifurcation. Resultant 40% stenosis at the ICA origin. Left carotid system: CCA and ICA patent within the neck. Atherosclerotic plaque. Most notably, there is prominent atherosclerotic plaque about the carotid bifurcation and within the proximal ICA which has progressed from the prior MRA neck of 10/04/2019. Resultant severe (near occlusive) stenosis of the proximal ICA. Tortuosity of the cervical ICA Vertebral arteries: The vertebral arteries are patent within the neck. Streak/beam hardening artifact limits evaluation of the right vertebral artery origin. At least moderate stenosis is suspected at this site. Atherosclerotic plaque scattered elsewhere within the cervical right vertebral artery with no more than mild stenosis. Calcified atherosclerotic plaque at the left vertebral artery origin with suspected at least moderate stenosis. Nonstenotic calcified plaque elsewhere within the cervical left vertebral artery. Skeleton: Cervical spondylosis. Other neck: No neck mass or cervical lymphadenopathy. Upper chest: No consolidation within the imaged lung apices. Review of the MIP images confirms the above findings CTA HEAD FINDINGS Anterior circulation: The intracranial internal carotid arteries are patent. As sclerotic plaque within both  vessels. No more than mild stenosis on the right. Up to moderate stenosis within the left cavernous segment. The M1 middle cerebral arteries are patent. Abrupt occlusion of a proximal M2 left middle cerebral artery vessel (series 11, image 22). Atherosclerotic irregularity of the M2 and more distal MCA vessels elsewhere. The anterior cerebral arteries are patent. Atherosclerotic irregularity of both vessels without high-grade proximal stenosis. A possible 2 mm periophthalmic left ICA aneurysm with better appreciated on the prior MRA head of 10/04/2019. Posterior circulation: The intracranial vertebral arteries are patent. Atherosclerotic plaque within the right V4 segment sites of mild stenosis. Non-stenotic atherosclerotic plaque within the left V4 segment. The basilar artery is patent. The posterior cerebral arteries are patent. Posterior communicating arteries are diminutive or absent, bilaterally. Venous sinuses: Assessment for dural venous sinus thrombosis is limited due to contrast timing. Anatomic variants: As described. Review of the MIP images confirms the above findings CT Brain Perfusion Findings: ASPECTS: CBF (<30%) Volume: 41mL Perfusion (Tmax>6.0s) volume: 86mL Mismatch Volume: 45mL Infarction Location:Left MCA vascular territory CTA head impression #1, the CT perfusion head impression and the presence of a severe stenosis of the proximal cervical left ICA called by telephone at the time of interpretation on 06/05/2023 at 8:40 am to provider ERIC [facility] , who verbally acknowledged these results. IMPRESSION: CTA neck: 1. No common carotid and internal carotid arteries are patent within the neck. Atherosclerotic plaque bilaterally. Most notably, there is progressive atherosclerotic plaque about the left carotid bifurcation and within the proximal left ICA with resultant severe, near occlusive stenosis of the proximal left ICA. Also of note, atherosclerotic plaque about the right carotid bifurcation  results in 40% stenosis at the right ICA origin. 2. The vertebral arteries are patent within the neck. Atherosclerotic plaque bilaterally as described. Most notably, there is suspected at least moderate stenoses at the bilateral vertebral artery origins. 3. Aortic Atherosclerosis (ICD10-I70.0). CTA head: 1. Abrupt occlusion of a proximal M2 left middle cerebral artery vessel. 2. Background intracranial atherosclerotic disease as described. 3. A possible 2 mm periophthalmic left ICA aneurysm was better appreciated on the prior MRA head of 10/04/2019. CT perfusion head: The perfusion software identifies a 41 mL core infarct in the left MCA vascular territory. The perfusion software identifies an 86 mL region of critically hypoperfused parenchyma within the left MCA vascular territory (utilizing the Tmax>6 seconds threshold). Reported mismatch volume: 45 mL Electronically Signed: By: Kyle  Golden D.O.  On: 06/05/2023 09:07   CT HEAD CODE STROKE WO CONTRAST Result Date: 06/05/2023 CLINICAL DATA:  Code stroke. Neuro deficit, acute, stroke suspected. EXAM: CT HEAD WITHOUT CONTRAST TECHNIQUE: Contiguous axial images were obtained from the base of the skull through the vertex without intravenous contrast. RADIATION DOSE REDUCTION: This exam was performed according to the departmental dose-optimization program which includes automated exposure control, adjustment of the mA and/or kV according to patient size and/or use of iterative reconstruction technique. COMPARISON:  Brain MRI 10/04/2019.  Noncontrast head CT 10/03/2019. FINDINGS: Brain: Generalized cerebral atrophy. Loss of gray-white differentiation consistent with an acute infarct within the left insula and within portions of the left frontal operculum (MCA vascular territory). Known small chronic cortically-based infarcts within the left frontal, left parietal and left occipital lobes were better appreciated on the prior brain MRI of 10/04/2019 (acute at that time).  Mild patchy and ill-defined hypoattenuation within the cerebral white matter, nonspecific but compatible with chronic small vessel ischemic disease. Subcentimeter infarct within the superior right cerebellar hemisphere, new from the prior MRI but chronic in appearance. Loss of gray-white differentiation there is no acute intracranial hemorrhage. No extra-axial fluid collection. No evidence of an intracranial mass. No midline shift. Vascular: No hyperdense vessel.  Atherosclerotic calcifications. Skull: No calvarial fracture or aggressive osseous lesion. Sinuses/Orbits: No mass or acute finding within the imaged orbits. No significant paranasal sinus disease. ASPECTS [facility] Stroke Program Early CT Score) - Ganglionic level infarction (caudate, lentiform nuclei, internal capsule, insula, M1-M3 cortex): 5 - Supraganglionic infarction (M4-M6 cortex): 2 Total score (0-10 with 10 being normal): 7 Impression #1 called by telephone at the time of interpretation on 06/05/2023 at 8:40 am to provider Dr. Merrianne, who verbally acknowledged these results. IMPRESSION: 1. Acute left MCA territory infarct affecting the left insula and portions of the left frontal operculum. ASPECTS is 7. 2. Known small chronic cortically-based infarcts within the left frontal, left parietal and left occipital lobes were better appreciated on the prior brain MRI of 10/04/2019 (acute at that time). 3. Background mild cerebral white matter chronic small vessel ischemic disease. 4. Subcentimeter infarct within the right cerebellar hemisphere, new from prior MRI but chronic in appearance. 5. Generalized cerebral atrophy. Electronically Signed   By: Rockey Childs D.O.   On: 06/05/2023 08:45     PHYSICAL EXAM  Temp:  [97.6 F (36.4 C)-99.2 F (37.3 C)] 98 F (36.7 C) (02/06 1600) Pulse Rate:  [58-73] 73 (02/06 1800) Resp:  [12-26] 26 (02/06 1800) BP: (102-150)/(54-110) 127/75 (02/06 1800) SpO2:  [92 %-100 %] 96 % (02/06 1800)  General -  obese, well developed, in no apparent distress.  Ophthalmologic - fundi not visualized due to noncooperation.  Cardiovascular - Regular rhythm and rate.  Neuro - awake, alert, eyes open, orientated to age, place, but not to time but perseverated on age.  Moderate to severe dysarthria, paucity of speech, following all simple commands. Able to name with perseveration and repeat simple sentence but not complicated sentence. No gaze palsy, tracking bilaterally, blinking to visual threat bilaterally.  Right facial droop. Tongue midline . Bilateral UEs 3/5, no drift. Bilaterally LEs 3/5. Sensation symmetrical bilaterally subjectively, b/l FTN intact grossly, gait not tested.    ASSESSMENT/PLAN Nathan Proctor. is a 83 y.o. male with history of hypertension, diabetes, CKD 3, stroke admitted for right-sided weakness numbness, aphasia, right facial droop, left gaze preference. No TNK given due to outside window.    Stroke:  left MCA infarct left M2  occlusion and left ICA near occlusion status post IR with TICI3 and confluent HT, likely secondary to large vessel disease source versus cardiomyopathy with low EF CT left MCA infarct CT head and neck left M2 occlusion, left ICA near occlusion, right ICA 43 stenosis, bilateral VA origin severe stenosis CTP 41/86 Status post IR with TICI3 and left ICA stenting MRI left MCA infarct at left insular and left frontal operculum, prominent and confluent petechial hemorrhagic transformation CT repeat in a.m. to evaluate hemorrhagic transformation 2D Echo EF 30% LDL 107 HgbA1c 6.1 UDS negative Heparin  subcu for VTE prophylaxis aspirin  81 mg daily and clopidogrel  75 mg daily prior to admission, now on aspirin  81 mg daily and Brilinta  (ticagrelor ) 90 mg bid.  However, given high co-pay of Brilinta , will switch back to aspirin  Plavix  Patient will be counseled to be compliant with his antithrombotic medications Ongoing aggressive stroke risk factor  management Therapy recommendations:  CIR Disposition: Pending  History of stroke 10/08/2019 admitted for left MCA infarct due to right upper extremity weakness.  MRA head and neck showed left ICA 30% stenosis.  EF 45 to 50%.  LDL 105, A1c 5.6.  Recommended loop recorder at that time but only got 30-day CardioNet monitoring which was no A-fib.  Discharged on DAPT and Lipitor  80.  Cardiomyopathy CHF 09/2019 EF 45 to 50% Current admission EF 30% Cardiology on board On DAPT Agree with GDMT May consider loop recorder placement to rule out A-fib  Diabetes HgbA1c 6.1 goal < 7.0 Controlled CBG monitoring SSI DM education and close PCP follow up  Hypertension Stable BP goal 120-160 given IR and hemorrhagic transformation Long term BP goal normotensive  Hyperlipidemia Home meds: Lipitor  80 LDL 105, goal < 70 Now on lipitor  80 and Zetia  Continue statin and at discharge  Dysphagia Poststroke dysphagia Speech on board N.p.o. On core track With tube feeding at 64  Other Stroke Risk Factors Advanced age Obesity, Body mass index is 41.94 kg/m.   Other Active Problems CD3b, creatinine 1.60--1.66  Hospital day # 1  This patient is critically ill due to left MCA and ICA occlusion status post IR and ICA stenting, cardiomyopathy and at significant risk of neurological worsening, death form recurrent stroke, hemorrhagic transformation, heart failure, aspiration pneumonia. This patient's care requires constant monitoring of vital signs, hemodynamics, respiratory and cardiac monitoring, review of multiple databases, neurological assessment, discussion with family, other specialists and medical decision making of high complexity. I spent 40 minutes of neurocritical care time in the care of this patient. I had long discussion with daughter at bedside, updated pt current condition, treatment plan and potential prognosis, and answered all the questions.  She expressed understanding and  appreciation.    Ary Cummins, MD PhD Stroke Neurology 06/06/2023 7:07 PM    To contact Stroke Continuity provider, please refer to [email or web address]. After hours, contact General Neurology

## 2023-06-06 NOTE — Progress Notes (Signed)
 Referring Physician(s): CODE STROKE  Supervising Physician: De Macedo Rodrigues, Katyucia  Patient Status:  [facility] - In-pt  Chief Complaint: Left M2 Occlusion and Left ICA Stenosis. S/p mechanical thrombectomy and left carotid stenting and angioplasty 06/05/23 with Dr. everitt Nile Erichsen  Subjective: Patient awake and alert in bed. No apparent discomfort or distress observed. He is able to state his name and DOB with some prompting. Able to move all extremities. Follows most commands without difficulty.   Allergies: Patient has no known allergies.  Medications: Prior to Admission medications   Medication Sig Start Date End Date Taking? Authorizing Provider  acetaminophen  (TYLENOL ) 325 MG tablet Take 1-2 tablets (325-650 mg total) by mouth every 4 (four) hours as needed for mild pain. 10/14/19   Love, Sharlet RAMAN, PA-C  allopurinol  (ZYLOPRIM ) 100 MG tablet Take 100 mg by mouth daily.  01/25/16   [provider]  amLODipine  (NORVASC ) 2.5 MG tablet 10 mg daily.  11/12/19   [provider]  aspirin  EC 81 MG EC tablet Take 1 tablet (81 mg total) by mouth daily. 10/08/19   Danford, Lonni SQUIBB, MD  atorvastatin  (LIPITOR ) 80 MG tablet Take 1 tablet (80 mg total) by mouth daily. 10/14/19   Love, Sharlet RAMAN, PA-C  benzonatate  (TESSALON ) 100 MG capsule Take 1 capsule (100 mg total) by mouth every 8 (eight) hours as needed for cough. 04/01/22   Hazen Darryle BRAVO, FNP  carvedilol  (COREG ) 25 MG tablet TAKE 1 TABLET BY MOUTH TWICE DAILY WITH A MEAL 09/05/20   Lesia Ozell Barter, PA-C  clopidogrel  (PLAVIX ) 75 MG tablet Take 1 tablet (75 mg total) by mouth daily. 10/14/19   Love, Sharlet RAMAN, PA-C  diclofenac Sodium (VOLTAREN) 1 % GEL  11/26/19   [provider]  fluticasone  (FLONASE ) 50 MCG/ACT nasal spray Place 1 spray into both nostrils daily. 04/01/22   Hazen Darryle BRAVO, FNP  gabapentin  (NEURONTIN ) 300 MG capsule Take 300 mg by mouth 3 (three) times daily. 02/05/22   [provider]  insulin  NPH-regular Human (70-30) 100 UNIT/ML injection Inject 5 Units into the skin daily. 10/14/19   Love, Sharlet RAMAN, PA-C  JARDIANCE  10 MG TABS tablet Take 10 mg by mouth daily. 03/31/22   [provider]  losartan  (COZAAR ) 25 MG tablet Take 1 tablet (25 mg total) by mouth daily. 12/23/19 03/22/20  Lesia Ozell Barter, PA-C  meclizine  (ANTIVERT ) 25 MG tablet Take 1 tablet (25 mg total) by mouth 2 (two) times daily as needed for dizziness. 01/07/23   Midge Golas, MD  Vitamin D, Ergocalciferol, (DRISDOL) 50000 units CAPS capsule Take 50,000 Units by mouth every 7 (seven) days.     [provider]     Vital Signs: BP (!) 112/56   Pulse 65   Temp 99.2 F (37.3 C) (Oral)   Resp 20   Wt 292 lb 5.3 oz (132.6 kg)   SpO2 100%   BMI 41.94 kg/m   Physical Exam Constitutional:      General: He is not in acute distress.    Appearance: He is not ill-appearing.  Cardiovascular:     Rate and Rhythm: Normal rate.     Pulses: Normal pulses.     Comments: Right groin vascular site is clean, soft, dry and non-tender.  Pulmonary:     Effort: Pulmonary effort is normal.  Abdominal:     Tenderness: There is no abdominal tenderness.  Skin:    General: Skin is warm and dry.  Neurological:  Mental Status: He is alert and oriented to person, place, and time.     Motor: No weakness.     Comments: Mild dysarthria, mild delay in responses.      Imaging: MR BRAIN WO CONTRAST Result Date: 06/06/2023 CLINICAL DATA:  Follow-up examination for stroke. EXAM: MRI HEAD WITHOUT CONTRAST TECHNIQUE: Multiplanar, multiecho pulse sequences of the brain and surrounding structures were obtained without intravenous contrast. COMPARISON:  Comparison made to multiple previous exams from 06/05/2023. FINDINGS: Brain: Examination degraded by motion artifact. Cerebral volume within normal limits for age. Patchy T2/FLAIR hyperintensity involving the periventricular deep white matter both cerebral  hemispheres, consistent with chronic small vessel ischemic disease, mild in nature. Small remote infarct noted within the right cerebellum. Confluent restricted diffusion involving the left insula and left frontal operculum, consistent with evolving acute left MCA distribution infarct. Area of infarction measures up to approximately 6 cm in AP diameter. Prominent associated susceptibility artifact, consistent with petechial hemorrhage (series 7, image 61) ( Heidelberg classification 1b: HI2, confluent petechiae, no mass effect. No frank or organized hematoma evident by MRI. No significant regional mass effect. Few additional small foci of infarction noted within the left parieto-occipital region posteriorly (series 3, images 40, 29). Apparent diffusion signal along the right frontal parafalcine region felt to be consistent with artifact related to dural calcification. Note made of a few additional chronic micro hemorrhages within the left cerebellum and right thalamus. No mass lesion or midline shift. Ventricles normal size without hydrocephalus. No extra-axial fluid collection. Pituitary gland suprasellar region within normal limits. Vascular: Major intracranial vascular flow voids are maintained. Skull and upper cervical spine: Craniocervical junction within normal limits. Bone marrow signal intensity overall within normal limits. No scalp soft tissue abnormality. Sinuses/Orbits: Prior bilateral ocular lens replacement. Paranasal sinuses are clear. No mastoid effusion. Other: None. IMPRESSION: 1. Evolving acute left MCA distribution infarct, most pronounced at the left insula and left frontal operculum. Prominent associated petechial hemorrhage without frank intraparenchymal hematoma (Heidelberg classification 1b: HI2, confluent petechiae, no mass effect). 2. Few additional small foci of acute infarction within the left parieto-occipital region posteriorly. 3. Underlying mild chronic microvascular ischemic disease  with small remote right cerebellar infarct. Electronically Signed   By: Morene Hoard M.D.   On: 06/06/2023 03:12   ECHOCARDIOGRAM COMPLETE Result Date: 06/05/2023    ECHOCARDIOGRAM REPORT   Patient Name:   Shahzad Thomann. Date of Exam: 06/05/2023 Medical Rec #:  [id number]              Height:       70.0 in Accession #:    [id number]             Weight:       292.3 lb Date of Birth:  05/18/1940              BSA:          2.453 m Patient Age:    82 years               BP:           129/72 mmHg Patient Gender: M                      HR:           89 bpm. Exam Location:  Inpatient Procedure: 2D Echo, Color Doppler, Cardiac Doppler and Intracardiac            Opacification Agent Indications:  Stroke I63.9  History:        Patient has prior history of Echocardiogram examinations, most                 recent 10/06/2019. Risk Factors:Diabetes, Hypertension and                 Dyslipidemia.  Sonographer:    Tinnie Gosling RDCS Referring Phys: ERIN C LEHNER IMPRESSIONS  1. Left ventricular ejection fraction, by estimation, is 30%. The left ventricle has moderately decreased function. The left ventricle demonstrates global hypokinesis. There is mild left ventricular hypertrophy.  2. Right ventricular systolic function is normal. The right ventricular size is normal.  3. Trivial mitral valve regurgitation.  4. The aortic valve is tricuspid. Aortic valve regurgitation is not visualized.  5. The inferior vena cava is normal in size with greater than 50% respiratory variability, suggesting right atrial pressure of 3 mmHg. Comparison(s): The left ventricular function is worsened. FINDINGS  Left Ventricle: Left ventricular ejection fraction, by estimation, is 30%. The left ventricle has moderately decreased function. The left ventricle demonstrates global hypokinesis. Definity  contrast agent was given IV to delineate the left ventricular endocardial borders. The left ventricular internal cavity size was normal in size.  There is mild left ventricular hypertrophy. Right Ventricle: The right ventricular size is normal. Right vetricular wall thickness was not assessed. Right ventricular systolic function is normal. Left Atrium: Left atrial size was normal in size. Right Atrium: Right atrial size was normal in size. Pericardium: There is no evidence of pericardial effusion. Mitral Valve: There is mild thickening of the mitral valve leaflet(s). Mild to moderate mitral annular calcification. Trivial mitral valve regurgitation. Tricuspid Valve: The tricuspid valve is normal in structure. Tricuspid valve regurgitation is trivial. Aortic Valve: The aortic valve is tricuspid. Aortic valve regurgitation is not visualized. Pulmonic Valve: The pulmonic valve was normal in structure. Pulmonic valve regurgitation is not visualized. Aorta: The aortic root and ascending aorta are structurally normal, with no evidence of dilitation. Venous: The inferior vena cava is normal in size with greater than 50% respiratory variability, suggesting right atrial pressure of 3 mmHg. IAS/Shunts: The interatrial septum was not assessed.  LEFT VENTRICLE PLAX 2D LVIDd:         5.00 cm   Diastology LVIDs:         4.40 cm   LV e' lateral: 5.33 cm/s LV PW:         1.20 cm LV IVS:        1.10 cm LVOT diam:     2.30 cm LV SV:         55 LV SV Index:   23 LVOT Area:     4.15 cm  RIGHT VENTRICLE RV S prime:     14.40 cm/s LEFT ATRIUM         Index LA diam:    4.60 cm 1.88 cm/m  AORTIC VALVE LVOT Vmax:   65.50 cm/s LVOT Vmean:  45.200 cm/s LVOT VTI:    0.133 m  AORTA Ao Root diam: 3.00 cm Ao Asc diam:  3.50 cm  SHUNTS Systemic VTI:  0.13 m Systemic Diam: 2.30 cm Vina Gull MD Electronically signed by Vina Gull MD Signature Date/Time: 06/05/2023/9:33:38 PM    Final    DG Abd Portable 1V Result Date: 06/05/2023 CLINICAL DATA:  Feeding tube placement. EXAM: PORTABLE ABDOMEN - 1 VIEW COMPARISON:  None Available. FINDINGS: Distal tip of feeding tube is seen in expected  position of distal  stomach. IMPRESSION: Distal tip of feeding tube seen in expected position of distal stomach. Electronically Signed   By: Lynwood Landy Raddle M.D.   On: 06/05/2023 15:49   CT HEAD WO CONTRAST Result Date: 06/05/2023 CLINICAL DATA:  Stroke, follow-up. Status post intracranial mechanical thrombectomy and stenting of the left ICA bifurcation. EXAM: CT HEAD WITHOUT CONTRAST TECHNIQUE: Contiguous axial images were obtained from the base of the skull through the vertex without intravenous contrast. RADIATION DOSE REDUCTION: This exam was performed according to the departmental dose-optimization program which includes automated exposure control, adjustment of the mA and/or kV according to patient size and/or use of iterative reconstruction technique. COMPARISON:  CT head without contrast and CT angio head and neck 06/05/2023. By plain CT 06/05/2023. FINDINGS: Brain: The study is mildly degraded by patient motion. The left insular and left opercular infarct is somewhat obscured by patient motion. The cortex is slightly hyperdense, potentially reflecting reperfusion. No hemorrhage is present. Basal ganglia are intact. Subcortical white matter hypoattenuation in the high left frontal lobe is new. Mild right-sided white matter disease is stable. Basal ganglia are intact. A remote lacunar infarct is again noted in the right cerebellum. The brainstem and cerebellum are otherwise within normal limits. Vascular: Atherosclerotic calcifications are present within the cavernous internal carotid arteries and at the normal origin of both vertebral arteries. No hyperdense vessel is present. Skull: Calvarium is intact. No focal lytic or blastic lesions are present. No significant extracranial soft tissue lesion is present. Sinuses/Orbits: The paranasal sinuses and mastoid air cells are clear. Bilateral lens replacements are noted. Globes and orbits are otherwise unremarkable. IMPRESSION: 1. The left insular and left  opercular infarct is somewhat obscured by patient motion. 2. The cortex is slightly hyperdense, potentially reflecting reperfusion. 3. No hemorrhage. 4. Subcortical white matter hypoattenuation in the high left frontal lobe is new. This may reflect ischemic changes or edema related to the reperfusion. 5. Remote lacunar infarct of the right cerebellum. 6. Stable mild right-sided white matter disease. This likely reflects the sequela of chronic microvascular ischemia. Electronically Signed   By: Lonni Necessary M.D.   On: 06/05/2023 14:38   IR PERCUTANEOUS ART THROMBECTOMY/INFUSION INTRACRANIAL INC DIAG ANGIO Result Date: 06/05/2023 INDICATION: 83 year old male presenting with right-sided weakness and aphasia; NIHSS 28. His last known well was 10 p.m. on 06/04/2023. His past medical history significant for prior stroke, hypertension, diabetes and chronic kidney disease; baseline modified Rankin scale 0. Head CT showed hypodensity within the left insula, basal ganglia and left frontal operculum (ASPECTS 7). No IV thrombolytic given as patient was outside the window. CT angiogram of the head and neck showed an occlusion of a left M2/MCA anterior division branch. CT perfusion showed a 41 mL core infarct with a 45 mL ischemic penumbra. She was transferred to our service for mechanical thrombectomy. EXAM: ULTRASOUND-GUIDED VASCULAR ACCESS DIAGNOSTIC CEREBRAL ANGIOGRAM MECHANICAL THROMBECTOMY FLAT PANEL HEAD CT LEFT CAROTID STENTING AND ANGIOPLASTY WITHOUT CEREBRAL PROTECTION DEVICE COMPARISON:  CT/CT angiogram of the head and neck June 05, 2023. MEDICATIONS: No antibiotics administered. ANESTHESIA/SEDATION: The procedure was performed under general anesthesia. CONTRAST:  80 mL of Omnipaque  300 milligram/mL FLUOROSCOPY: Radiation Exposure Index (as provided by the fluoroscopic device): 1315 mGy Kerma COMPLICATIONS: None immediate. TECHNIQUE: Informed written consent was obtained from the patient's wife after a  thorough discussion of the procedural risks, benefits and alternatives. All questions were addressed. Maximal Sterile Barrier Technique was utilized including caps, mask, sterile gowns, sterile gloves, sterile drape, hand hygiene and skin  antiseptic. A timeout was performed prior to the initiation of the procedure. The right groin was prepped and draped in the usual sterile fashion. Using a micropuncture kit and the modified Seldinger technique, access was gained to the right common femoral artery and an 8 French sheath was placed. Real-time ultrasound guidance was utilized for vascular access including the acquisition of a permanent ultrasound image documenting patency of the accessed vessel. Under fluoroscopy, an 8 French Walrus balloon guide catheter was navigated over a 6 French VTK catheter and a 0.035 Terumo Glidewire into the aortic arch. The catheter was placed into the left common carotid artery and then advanced into the left internal carotid artery. The diagnostic catheter was removed. Frontal and lateral angiograms of the head were obtained. FINDINGS: 1. Ultrasound showed heavily calcified right common femoral artery is maintained patency and caliber. 2. Proximal occlusion of a left M2/MCA anterior division branch. 3. Atherosclerotic changes of the intracranial left ICA with mild stenosis at the distal cavernous segment. 4. 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 PROCEDURE: Using biplane roadmap guidance, a Red 62 aspiration catheter was navigated over Colossus 35 microguidewire into the cavernous segment of the left ICA. The aspiration catheter was then advanced to the level of occlusion and connected to an aspiration pump. Continuous aspiration was performed for 2 minutes. The guide catheter was connected to a VacLok syringe and the guiding catheter balloon was inflated. The aspiration catheter  was subsequently removed under constant aspiration. The guide catheter was aspirated for debris. Left internal carotid artery angiograms with frontal and lateral views of the head showed complete recanalization of the left MCA vascular tree. The guide catheter was retracted into the neck. Frontal and lateral angiograms of the neck were obtained. Improvement of the degree of stenosis in the carotid bulb compared to prior CT angiogram. There is prominent luminal irregularity at the carotid bulb residual moderate stenosis. Increased tortuosity of the proximal/mid cervical left ICA with kinking. Left internal carotid artery angiograms with left anterior oblique views of the neck showed evidence of filling defects at the level of the carotid bulb. Flat panel CT of the head was obtained and post processed in a separate workstation with concurrent attending physician supervision. Selected images were sent to PACS. No evidence of hemorrhagic complication. There is mild contrast staining of the left insular and frontal cortex. Repeat left internal carotid artery angiograms with frontal and lateral views of the head showed Amy seen left M3/MCA branch to the left parietal region. Left common carotid artery angiograms with frontal and lateral views of the neck showed vertebra Gretchen of stenosis at the left ICA bulb with more prominent filling defect. At this point, patient was loaded on cangrelor  followed by continuous drip. Using biplane roadmap guidance, a 4-7 mm Emboshield NAV6 cerebral protection device was advanced into the cervical left ICA. However, multiple attempts to advance a cerebral protection device through the left ICA kinking proved unsuccessful. The cerebral protection device was subsequently removed. Using biplane roadmap guidance, a 10-8 x 40 mm XACT carotid stent was navigated and deployed from the distal left common carotid artery to the proximal left internal carotid artery, proximal to the vessel  kinking. Suboptimal stent expansion was noted. Then, a 6 x 30 mm Viatrac balloon was navigated into the recently deployed stent. Angioplasty was performed under fluoroscopy. Left internal carotid artery angiograms with frontal and lateral views of the  neck showed adequate stent positioning and expansion with brisk anterograde flow. Left internal carotid artery angiograms with frontal and lateral views of the head showed improvement of anterograde flow in the left MCA vascular tree with brisk anterograde flow. Delayed left common carotid artery angiograms with frontal and lateral views of the neck showed no evidence of clot formation within the stent. The catheter was subsequently Riddle. Right common femoral artery angiogram was obtained in right anterior oblique view. The puncture is at the level of the common femoral artery. The artery has normal caliber, adequate for closure device. The sheath was exchanged over the wire for an 8 French Angio-Seal which was utilized for access closure. Immediate hemostasis was achieved. IMPRESSION: 1. Successful mechanical thrombectomy for treatment of a proximal left M2/MCA anterior division branch occlusion achieving complete recanalization (TICI 3). 2. 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 PLAN: Continue cangrelor  infusion until patient is transitioned to oral dual antiplatelet therapy. Electronically Signed   By: Katyucia  de Macedo Rodrigues M.D.   On: 06/05/2023 13:16   IR US  Guide Vasc Access Right Result Date: 06/05/2023 INDICATION: 83 year old male presenting with right-sided weakness and aphasia; NIHSS 28. His last known well was 10 p.m. on 06/04/2023. His past medical history significant for prior stroke, hypertension, diabetes and chronic kidney disease; baseline modified Rankin scale 0. Head CT showed hypodensity  within the left insula, basal ganglia and left frontal operculum (ASPECTS 7). No IV thrombolytic given as patient was outside the window. CT angiogram of the head and neck showed an occlusion of a left M2/MCA anterior division branch. CT perfusion showed a 41 mL core infarct with a 45 mL ischemic penumbra. She was transferred to our service for mechanical thrombectomy. EXAM: ULTRASOUND-GUIDED VASCULAR ACCESS DIAGNOSTIC CEREBRAL ANGIOGRAM MECHANICAL THROMBECTOMY FLAT PANEL HEAD CT LEFT CAROTID STENTING AND ANGIOPLASTY WITHOUT CEREBRAL PROTECTION DEVICE COMPARISON:  CT/CT angiogram of the head and neck June 05, 2023. MEDICATIONS: No antibiotics administered. ANESTHESIA/SEDATION: The procedure was performed under general anesthesia. CONTRAST:  80 mL of Omnipaque  300 milligram/mL FLUOROSCOPY: Radiation Exposure Index (as provided by the fluoroscopic device): 1315 mGy Kerma COMPLICATIONS: None immediate. TECHNIQUE: Informed written consent was obtained from the patient's wife after a thorough discussion of the procedural risks, benefits and alternatives. All questions were addressed. Maximal Sterile Barrier Technique was utilized including caps, mask, sterile gowns, sterile gloves, sterile drape, hand hygiene and skin antiseptic. A timeout was performed prior to the initiation of the procedure. The right groin was prepped and draped in the usual sterile fashion. Using a micropuncture kit and the modified Seldinger technique, access was gained to the right common femoral artery and an 8 French sheath was placed. Real-time ultrasound guidance was utilized for vascular access including the acquisition of a permanent ultrasound image documenting patency of the accessed vessel. Under fluoroscopy, an 8 French Walrus balloon guide catheter was navigated over a 6 French VTK catheter and a 0.035 Terumo Glidewire into the aortic arch. The catheter was placed into the left common carotid artery and then advanced into the left  internal carotid artery. The diagnostic catheter was removed. Frontal and lateral angiograms of the head were obtained. FINDINGS: 1. Ultrasound showed heavily calcified right common femoral artery is maintained patency and caliber. 2. Proximal occlusion of a left M2/MCA anterior division branch. 3. Atherosclerotic changes of the intracranial left ICA with mild stenosis at the distal cavernous segment.  4. 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 PROCEDURE: Using biplane roadmap guidance, a Red 62 aspiration catheter was navigated over Colossus 35 microguidewire into the cavernous segment of the left ICA. The aspiration catheter was then advanced to the level of occlusion and connected to an aspiration pump. Continuous aspiration was performed for 2 minutes. The guide catheter was connected to a VacLok syringe and the guiding catheter balloon was inflated. The aspiration catheter was subsequently removed under constant aspiration. The guide catheter was aspirated for debris. Left internal carotid artery angiograms with frontal and lateral views of the head showed complete recanalization of the left MCA vascular tree. The guide catheter was retracted into the neck. Frontal and lateral angiograms of the neck were obtained. Improvement of the degree of stenosis in the carotid bulb compared to prior CT angiogram. There is prominent luminal irregularity at the carotid bulb residual moderate stenosis. Increased tortuosity of the proximal/mid cervical left ICA with kinking. Left internal carotid artery angiograms with left anterior oblique views of the neck showed evidence of filling defects at the level of the carotid bulb. Flat panel CT of the head was obtained and post processed in a separate workstation with concurrent attending physician supervision. Selected images were sent to PACS. No evidence of hemorrhagic  complication. There is mild contrast staining of the left insular and frontal cortex. Repeat left internal carotid artery angiograms with frontal and lateral views of the head showed Amy seen left M3/MCA branch to the left parietal region. Left common carotid artery angiograms with frontal and lateral views of the neck showed vertebra Gretchen of stenosis at the left ICA bulb with more prominent filling defect. At this point, patient was loaded on cangrelor  followed by continuous drip. Using biplane roadmap guidance, a 4-7 mm Emboshield NAV6 cerebral protection device was advanced into the cervical left ICA. However, multiple attempts to advance a cerebral protection device through the left ICA kinking proved unsuccessful. The cerebral protection device was subsequently removed. Using biplane roadmap guidance, a 10-8 x 40 mm XACT carotid stent was navigated and deployed from the distal left common carotid artery to the proximal left internal carotid artery, proximal to the vessel kinking. Suboptimal stent expansion was noted. Then, a 6 x 30 mm Viatrac balloon was navigated into the recently deployed stent. Angioplasty was performed under fluoroscopy. Left internal carotid artery angiograms with frontal and lateral views of the neck showed adequate stent positioning and expansion with brisk anterograde flow. Left internal carotid artery angiograms with frontal and lateral views of the head showed improvement of anterograde flow in the left MCA vascular tree with brisk anterograde flow. Delayed left common carotid artery angiograms with frontal and lateral views of the neck showed no evidence of clot formation within the stent. The catheter was subsequently Riddle. Right common femoral artery angiogram was obtained in right anterior oblique view. The puncture is at the level of the common femoral artery. The artery has normal caliber, adequate for closure device. The sheath was exchanged over the wire for an 8 French  Angio-Seal which was utilized for access closure. Immediate hemostasis was achieved. IMPRESSION: 1. Successful mechanical thrombectomy for treatment of a proximal left M2/MCA anterior division branch occlusion achieving complete recanalization (TICI 3). 2. 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 PLAN: Continue cangrelor   infusion until patient is transitioned to oral dual antiplatelet therapy. Electronically Signed   By: Katyucia  de Macedo Rodrigues M.D.   On: 06/05/2023 13:16   IR INTRAVSC STENT CERV CAROTID W/O EMB-PROT MOD SED Result Date: 06/05/2023 INDICATION: 83 year old male presenting with right-sided weakness and aphasia; NIHSS 28. His last known well was 10 p.m. on 06/04/2023. His past medical history significant for prior stroke, hypertension, diabetes and chronic kidney disease; baseline modified Rankin scale 0. Head CT showed hypodensity within the left insula, basal ganglia and left frontal operculum (ASPECTS 7). No IV thrombolytic given as patient was outside the window. CT angiogram of the head and neck showed an occlusion of a left M2/MCA anterior division branch. CT perfusion showed a 41 mL core infarct with a 45 mL ischemic penumbra. She was transferred to our service for mechanical thrombectomy. EXAM: ULTRASOUND-GUIDED VASCULAR ACCESS DIAGNOSTIC CEREBRAL ANGIOGRAM MECHANICAL THROMBECTOMY FLAT PANEL HEAD CT LEFT CAROTID STENTING AND ANGIOPLASTY WITHOUT CEREBRAL PROTECTION DEVICE COMPARISON:  CT/CT angiogram of the head and neck June 05, 2023. MEDICATIONS: No antibiotics administered. ANESTHESIA/SEDATION: The procedure was performed under general anesthesia. CONTRAST:  80 mL of Omnipaque  300 milligram/mL FLUOROSCOPY: Radiation Exposure Index (as provided by the fluoroscopic device): 1315 mGy Kerma COMPLICATIONS: None immediate. TECHNIQUE: Informed  written consent was obtained from the patient's wife after a thorough discussion of the procedural risks, benefits and alternatives. All questions were addressed. Maximal Sterile Barrier Technique was utilized including caps, mask, sterile gowns, sterile gloves, sterile drape, hand hygiene and skin antiseptic. A timeout was performed prior to the initiation of the procedure. The right groin was prepped and draped in the usual sterile fashion. Using a micropuncture kit and the modified Seldinger technique, access was gained to the right common femoral artery and an 8 French sheath was placed. Real-time ultrasound guidance was utilized for vascular access including the acquisition of a permanent ultrasound image documenting patency of the accessed vessel. Under fluoroscopy, an 8 French Walrus balloon guide catheter was navigated over a 6 French VTK catheter and a 0.035 Terumo Glidewire into the aortic arch. The catheter was placed into the left common carotid artery and then advanced into the left internal carotid artery. The diagnostic catheter was removed. Frontal and lateral angiograms of the head were obtained. FINDINGS: 1. Ultrasound showed heavily calcified right common femoral artery is maintained patency and caliber. 2. Proximal occlusion of a left M2/MCA anterior division branch. 3. Atherosclerotic changes of the intracranial left ICA with mild stenosis at the distal cavernous segment. 4. 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 PROCEDURE: Using biplane roadmap guidance, a Red 62 aspiration catheter was navigated over Colossus 35 microguidewire into the cavernous segment of the left ICA. The aspiration catheter was then advanced to the level of occlusion and connected to an aspiration pump. Continuous aspiration was performed for 2 minutes. The guide catheter was connected to a VacLok syringe and the  guiding catheter balloon was inflated. The aspiration catheter was subsequently removed under constant aspiration. The guide catheter was aspirated for debris. Left internal carotid artery angiograms with frontal and lateral views of the head showed complete recanalization of the left MCA vascular tree. The guide catheter was retracted into the neck. Frontal and lateral angiograms of the neck were obtained. Improvement of the degree of stenosis in the carotid bulb compared to prior CT angiogram. There is prominent luminal irregularity at the carotid bulb residual  moderate stenosis. Increased tortuosity of the proximal/mid cervical left ICA with kinking. Left internal carotid artery angiograms with left anterior oblique views of the neck showed evidence of filling defects at the level of the carotid bulb. Flat panel CT of the head was obtained and post processed in a separate workstation with concurrent attending physician supervision. Selected images were sent to PACS. No evidence of hemorrhagic complication. There is mild contrast staining of the left insular and frontal cortex. Repeat left internal carotid artery angiograms with frontal and lateral views of the head showed Amy seen left M3/MCA branch to the left parietal region. Left common carotid artery angiograms with frontal and lateral views of the neck showed vertebra Gretchen of stenosis at the left ICA bulb with more prominent filling defect. At this point, patient was loaded on cangrelor  followed by continuous drip. Using biplane roadmap guidance, a 4-7 mm Emboshield NAV6 cerebral protection device was advanced into the cervical left ICA. However, multiple attempts to advance a cerebral protection device through the left ICA kinking proved unsuccessful. The cerebral protection device was subsequently removed. Using biplane roadmap guidance, a 10-8 x 40 mm XACT carotid stent was navigated and deployed from the distal left common carotid artery to the  proximal left internal carotid artery, proximal to the vessel kinking. Suboptimal stent expansion was noted. Then, a 6 x 30 mm Viatrac balloon was navigated into the recently deployed stent. Angioplasty was performed under fluoroscopy. Left internal carotid artery angiograms with frontal and lateral views of the neck showed adequate stent positioning and expansion with brisk anterograde flow. Left internal carotid artery angiograms with frontal and lateral views of the head showed improvement of anterograde flow in the left MCA vascular tree with brisk anterograde flow. Delayed left common carotid artery angiograms with frontal and lateral views of the neck showed no evidence of clot formation within the stent. The catheter was subsequently Riddle. Right common femoral artery angiogram was obtained in right anterior oblique view. The puncture is at the level of the common femoral artery. The artery has normal caliber, adequate for closure device. The sheath was exchanged over the wire for an 8 French Angio-Seal which was utilized for access closure. Immediate hemostasis was achieved. IMPRESSION: 1. Successful mechanical thrombectomy for treatment of a proximal left M2/MCA anterior division branch occlusion achieving complete recanalization (TICI 3). 2. 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 PLAN: Continue cangrelor  infusion until patient is transitioned to oral dual antiplatelet therapy. Electronically Signed   By: Katyucia  de Macedo Rodrigues M.D.   On: 06/05/2023 13:16   IR CT Head Ltd Result Date: 06/05/2023 INDICATION: 83 year old male presenting with right-sided weakness and aphasia; NIHSS 28. His last known well was 10 p.m. on 06/04/2023. His past medical history significant for prior stroke, hypertension, diabetes and chronic kidney disease; baseline modified  Rankin scale 0. Head CT showed hypodensity within the left insula, basal ganglia and left frontal operculum (ASPECTS 7). No IV thrombolytic given as patient was outside the window. CT angiogram of the head and neck showed an occlusion of a left M2/MCA anterior division branch. CT perfusion showed a 41 mL core infarct with a 45 mL ischemic penumbra. She was transferred to our service for mechanical thrombectomy. EXAM: ULTRASOUND-GUIDED VASCULAR ACCESS DIAGNOSTIC CEREBRAL ANGIOGRAM MECHANICAL THROMBECTOMY FLAT PANEL HEAD CT LEFT CAROTID STENTING AND ANGIOPLASTY WITHOUT CEREBRAL PROTECTION DEVICE COMPARISON:  CT/CT angiogram of the  head and neck June 05, 2023. MEDICATIONS: No antibiotics administered. ANESTHESIA/SEDATION: The procedure was performed under general anesthesia. CONTRAST:  80 mL of Omnipaque  300 milligram/mL FLUOROSCOPY: Radiation Exposure Index (as provided by the fluoroscopic device): 1315 mGy Kerma COMPLICATIONS: None immediate. TECHNIQUE: Informed written consent was obtained from the patient's wife after a thorough discussion of the procedural risks, benefits and alternatives. All questions were addressed. Maximal Sterile Barrier Technique was utilized including caps, mask, sterile gowns, sterile gloves, sterile drape, hand hygiene and skin antiseptic. A timeout was performed prior to the initiation of the procedure. The right groin was prepped and draped in the usual sterile fashion. Using a micropuncture kit and the modified Seldinger technique, access was gained to the right common femoral artery and an 8 French sheath was placed. Real-time ultrasound guidance was utilized for vascular access including the acquisition of a permanent ultrasound image documenting patency of the accessed vessel. Under fluoroscopy, an 8 French Walrus balloon guide catheter was navigated over a 6 French VTK catheter and a 0.035 Terumo Glidewire into the aortic arch. The catheter was placed into the left common  carotid artery and then advanced into the left internal carotid artery. The diagnostic catheter was removed. Frontal and lateral angiograms of the head were obtained. FINDINGS: 1. Ultrasound showed heavily calcified right common femoral artery is maintained patency and caliber. 2. Proximal occlusion of a left M2/MCA anterior division branch. 3. Atherosclerotic changes of the intracranial left ICA with mild stenosis at the distal cavernous segment. 4. 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 PROCEDURE: Using biplane roadmap guidance, a Red 62 aspiration catheter was navigated over Colossus 35 microguidewire into the cavernous segment of the left ICA. The aspiration catheter was then advanced to the level of occlusion and connected to an aspiration pump. Continuous aspiration was performed for 2 minutes. The guide catheter was connected to a VacLok syringe and the guiding catheter balloon was inflated. The aspiration catheter was subsequently removed under constant aspiration. The guide catheter was aspirated for debris. Left internal carotid artery angiograms with frontal and lateral views of the head showed complete recanalization of the left MCA vascular tree. The guide catheter was retracted into the neck. Frontal and lateral angiograms of the neck were obtained. Improvement of the degree of stenosis in the carotid bulb compared to prior CT angiogram. There is prominent luminal irregularity at the carotid bulb residual moderate stenosis. Increased tortuosity of the proximal/mid cervical left ICA with kinking. Left internal carotid artery angiograms with left anterior oblique views of the neck showed evidence of filling defects at the level of the carotid bulb. Flat panel CT of the head was obtained and post processed in a separate workstation with concurrent attending physician supervision. Selected images  were sent to PACS. No evidence of hemorrhagic complication. There is mild contrast staining of the left insular and frontal cortex. Repeat left internal carotid artery angiograms with frontal and lateral views of the head showed Amy seen left M3/MCA branch to the left parietal region. Left common carotid artery angiograms with frontal and lateral views of the neck showed vertebra Gretchen of stenosis at the left ICA bulb with more prominent filling defect. At this point, patient was loaded on cangrelor  followed by continuous drip. Using biplane roadmap guidance, a 4-7 mm Emboshield NAV6 cerebral protection device was advanced into the cervical left ICA. However, multiple attempts to advance a cerebral protection device  through the left ICA kinking proved unsuccessful. The cerebral protection device was subsequently removed. Using biplane roadmap guidance, a 10-8 x 40 mm XACT carotid stent was navigated and deployed from the distal left common carotid artery to the proximal left internal carotid artery, proximal to the vessel kinking. Suboptimal stent expansion was noted. Then, a 6 x 30 mm Viatrac balloon was navigated into the recently deployed stent. Angioplasty was performed under fluoroscopy. Left internal carotid artery angiograms with frontal and lateral views of the neck showed adequate stent positioning and expansion with brisk anterograde flow. Left internal carotid artery angiograms with frontal and lateral views of the head showed improvement of anterograde flow in the left MCA vascular tree with brisk anterograde flow. Delayed left common carotid artery angiograms with frontal and lateral views of the neck showed no evidence of clot formation within the stent. The catheter was subsequently Riddle. Right common femoral artery angiogram was obtained in right anterior oblique view. The puncture is at the level of the common femoral artery. The artery has normal caliber, adequate for closure device. The  sheath was exchanged over the wire for an 8 French Angio-Seal which was utilized for access closure. Immediate hemostasis was achieved. IMPRESSION: 1. Successful mechanical thrombectomy for treatment of a proximal left M2/MCA anterior division branch occlusion achieving complete recanalization (TICI 3). 2. 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 PLAN: Continue cangrelor  infusion until patient is transitioned to oral dual antiplatelet therapy. Electronically Signed   By: Katyucia  de Macedo Rodrigues M.D.   On: 06/05/2023 13:16   CT ANGIO HEAD NECK W WO CM W PERF (CODE STROKE) Addendum Date: 06/05/2023 ADDENDUM REPORT: 06/05/2023 12:25 ADDENDUM: Please note, there is a dictation error within CTA neck impression #1, which should read: The common carotid and internal carotid arteries are patent within the neck. Atherosclerotic plaque bilaterally. Most notably, there is progressive atherosclerotic plaque about the left carotid bifurcation and within the proximal left ICA with resultant severe near occlusive stenosis of the proximal left ICA. Also of note, atherosclerotic plaque about the right carotid bifurcation results in a 40% stenosis at the right ICA origin. Electronically Signed   By: Rockey Childs D.O.   On: 06/05/2023 12:25   Result Date: 06/05/2023 CLINICAL DATA:  Provided history: Cerebrovascular accident, unspecified mechanism. Right-sided weakness. Right-sided facial droop. Altered mental status. EXAM: CT ANGIOGRAPHY HEAD AND NECK CT PERFUSION BRAIN TECHNIQUE: Multidetector CT imaging of the head and neck was performed using the standard protocol during bolus administration of intravenous contrast. Multiplanar CT image reconstructions and MIPs were obtained to evaluate the vascular anatomy. Carotid stenosis measurements (when applicable) are obtained utilizing  NASCET criteria, using the distal internal carotid diameter as the denominator. Multiphase CT imaging of the brain was performed following IV bolus contrast injection. Subsequent parametric perfusion maps were calculated using RAPID software. RADIATION DOSE REDUCTION: This exam was performed according to the departmental dose-optimization program which includes automated exposure control, adjustment of the mA and/or kV according to patient size and/or use of iterative reconstruction technique. CONTRAST:  OMNIPAQUE  IOHEXOL  350 MG/ML SOLN COMPARISON:  Noncontrast head CT performed earlier today 06/05/2023. MRA head and MRA neck 10/04/2019. FINDINGS: CTA NECK FINDINGS Aortic arch: Common origin of the innominate and left common carotid arteries. Atherosclerotic plaque within the visualized thoracic aorta and proximal major branch vessels of the neck. Streak/beam hardening artifact arising from a dense  right-sided contrast bolus partially obscures the right subclavian artery. Within this limitation, there is no appreciable hemodynamically significant innominate or proximal subclavian artery stenosis. Right carotid system: CCA and ICA patent within the neck. Atherosclerotic plaque, greatest about the carotid bifurcation. Resultant 40% stenosis at the ICA origin. Left carotid system: CCA and ICA patent within the neck. Atherosclerotic plaque. Most notably, there is prominent atherosclerotic plaque about the carotid bifurcation and within the proximal ICA which has progressed from the prior MRA neck of 10/04/2019. Resultant severe (near occlusive) stenosis of the proximal ICA. Tortuosity of the cervical ICA Vertebral arteries: The vertebral arteries are patent within the neck. Streak/beam hardening artifact limits evaluation of the right vertebral artery origin. At least moderate stenosis is suspected at this site. Atherosclerotic plaque scattered elsewhere within the cervical right vertebral artery with no more than  mild stenosis. Calcified atherosclerotic plaque at the left vertebral artery origin with suspected at least moderate stenosis. Nonstenotic calcified plaque elsewhere within the cervical left vertebral artery. Skeleton: Cervical spondylosis. Other neck: No neck mass or cervical lymphadenopathy. Upper chest: No consolidation within the imaged lung apices. Review of the MIP images confirms the above findings CTA HEAD FINDINGS Anterior circulation: The intracranial internal carotid arteries are patent. As sclerotic plaque within both vessels. No more than mild stenosis on the right. Up to moderate stenosis within the left cavernous segment. The M1 middle cerebral arteries are patent. Abrupt occlusion of a proximal M2 left middle cerebral artery vessel (series 11, image 22). Atherosclerotic irregularity of the M2 and more distal MCA vessels elsewhere. The anterior cerebral arteries are patent. Atherosclerotic irregularity of both vessels without high-grade proximal stenosis. A possible 2 mm periophthalmic left ICA aneurysm with better appreciated on the prior MRA head of 10/04/2019. Posterior circulation: The intracranial vertebral arteries are patent. Atherosclerotic plaque within the right V4 segment sites of mild stenosis. Non-stenotic atherosclerotic plaque within the left V4 segment. The basilar artery is patent. The posterior cerebral arteries are patent. Posterior communicating arteries are diminutive or absent, bilaterally. Venous sinuses: Assessment for dural venous sinus thrombosis is limited due to contrast timing. Anatomic variants: As described. Review of the MIP images confirms the above findings CT Brain Perfusion Findings: ASPECTS: CBF (<30%) Volume: 41mL Perfusion (Tmax>6.0s) volume: 86mL Mismatch Volume: 45mL Infarction Location:Left MCA vascular territory CTA head impression #1, the CT perfusion head impression and the presence of a severe stenosis of the proximal cervical left ICA called by telephone  at the time of interpretation on 06/05/2023 at 8:40 am to provider ERIC [facility] , who verbally acknowledged these results. IMPRESSION: CTA neck: 1. No common carotid and internal carotid arteries are patent within the neck. Atherosclerotic plaque bilaterally. Most notably, there is progressive atherosclerotic plaque about the left carotid bifurcation and within the proximal left ICA with resultant severe, near occlusive stenosis of the proximal left ICA. Also of note, atherosclerotic plaque about the right carotid bifurcation results in 40% stenosis at the right ICA origin. 2. The vertebral arteries are patent within the neck. Atherosclerotic plaque bilaterally as described. Most notably, there is suspected at least moderate stenoses at the bilateral vertebral artery origins. 3. Aortic Atherosclerosis (ICD10-I70.0). CTA head: 1. Abrupt occlusion of a proximal M2 left middle cerebral artery vessel. 2. Background intracranial atherosclerotic disease as described. 3. A possible 2 mm periophthalmic left ICA aneurysm was better appreciated on the prior MRA head of 10/04/2019. CT perfusion head: The perfusion software identifies a 41 mL core infarct in the left MCA vascular territory.  The perfusion software identifies an 86 mL region of critically hypoperfused parenchyma within the left MCA vascular territory (utilizing the Tmax>6 seconds threshold). Reported mismatch volume: 45 mL Electronically Signed: By: Rockey Childs D.O. On: 06/05/2023 09:07   CT HEAD CODE STROKE WO CONTRAST Result Date: 06/05/2023 CLINICAL DATA:  Code stroke. Neuro deficit, acute, stroke suspected. EXAM: CT HEAD WITHOUT CONTRAST TECHNIQUE: Contiguous axial images were obtained from the base of the skull through the vertex without intravenous contrast. RADIATION DOSE REDUCTION: This exam was performed according to the departmental dose-optimization program which includes automated exposure control, adjustment of the mA and/or kV according to patient  size and/or use of iterative reconstruction technique. COMPARISON:  Brain MRI 10/04/2019.  Noncontrast head CT 10/03/2019. FINDINGS: Brain: Generalized cerebral atrophy. Loss of gray-white differentiation consistent with an acute infarct within the left insula and within portions of the left frontal operculum (MCA vascular territory). Known small chronic cortically-based infarcts within the left frontal, left parietal and left occipital lobes were better appreciated on the prior brain MRI of 10/04/2019 (acute at that time). Mild patchy and ill-defined hypoattenuation within the cerebral white matter, nonspecific but compatible with chronic small vessel ischemic disease. Subcentimeter infarct within the superior right cerebellar hemisphere, new from the prior MRI but chronic in appearance. Loss of gray-white differentiation there is no acute intracranial hemorrhage. No extra-axial fluid collection. No evidence of an intracranial mass. No midline shift. Vascular: No hyperdense vessel.  Atherosclerotic calcifications. Skull: No calvarial fracture or aggressive osseous lesion. Sinuses/Orbits: No mass or acute finding within the imaged orbits. No significant paranasal sinus disease. ASPECTS [facility] Stroke Program Early CT Score) - Ganglionic level infarction (caudate, lentiform nuclei, internal capsule, insula, M1-M3 cortex): 5 - Supraganglionic infarction (M4-M6 cortex): 2 Total score (0-10 with 10 being normal): 7 Impression #1 called by telephone at the time of interpretation on 06/05/2023 at 8:40 am to provider Dr. Merrianne, who verbally acknowledged these results. IMPRESSION: 1. Acute left MCA territory infarct affecting the left insula and portions of the left frontal operculum. ASPECTS is 7. 2. Known small chronic cortically-based infarcts within the left frontal, left parietal and left occipital lobes were better appreciated on the prior brain MRI of 10/04/2019 (acute at that time). 3. Background mild cerebral  white matter chronic small vessel ischemic disease. 4. Subcentimeter infarct within the right cerebellar hemisphere, new from prior MRI but chronic in appearance. 5. Generalized cerebral atrophy. Electronically Signed   By: Rockey Childs D.O.   On: 06/05/2023 08:45    Labs:  CBC: Recent Labs    10/04/22 1130 01/06/23 1713 06/05/23 0815 06/05/23 0817 06/06/23 0557  WBC 4.1 4.5 6.8  --  10.0  HGB 11.5* 11.4* 11.9* 12.6* 11.4*  HCT 36.3* 36.3* 37.8* 37.0* 36.3*  PLT 164 186 181  --  177    COAGS: Recent Labs    06/05/23 0815 06/06/23 0557  INR 1.0 1.3*  APTT 22*  --     BMP: Recent Labs    10/04/22 1130 01/06/23 1713 06/05/23 0815 06/05/23 0817 06/06/23 0557  NA 140 140 142 142 139  K 4.4 4.0 3.8 3.7 5.0  CL 106 107 109 108 110  CO2 24 25 20*  --  16*  GLUCOSE 195* 156* 203* 201* 161*  BUN 23 18 18 21 23   CALCIUM  9.1 9.3 9.4  --  8.7*  CREATININE 1.56* 1.49* 1.59* 1.60* 1.66*  GFRNONAA  --  46* 43*  --  41*    LIVER FUNCTION  TESTS: Recent Labs    06/05/23 0815 06/06/23 0557  BILITOT 1.2 1.1  AST 20 26  ALT 13 12  ALKPHOS 76 72  PROT 6.5 6.1*  ALBUMIN  3.6 3.3*    Assessment and Plan:  Left M2 Occlusion and Left ICA Stenosis. S/p mechanical thrombectomy and left carotid stenting and angioplasty 06/05/23 with Dr. everitt Nile Erichsen  Patient alert, oriented and abe to answer most questions/follow most commands appropriately. He is able to spontaneously move all 4 extremities. Right groin vascular site is clean, soft, dry and non-tender.   Repeat CT head 06/05/23 negative for post-procedural headache. MRI Brain 06/06/23 shows evolving left MCA distribution infarct with associated petechial hemorrhage. He received aspirin  and Brilinta  this morning. Swallow eval pending. Patient has been evaluated by inpatient rehab and appears to be a strong candidate for admission.   Please contact NIR with any questions.   Electronically Signed: Warren Dais,  AGACNP-BC 06/06/2023, 11:57 AM   I spent a total of 15 Minutes at the the patient's bedside AND on the patient's hospital floor or unit, greater than 50% of which was counseling/coordinating care for Left MCA infarct.

## 2023-06-06 NOTE — Evaluation (Signed)
 Speech Language Pathology Evaluation Patient Details Name: Nathan Proctor. MRN: [id number] DOB: 24-May-1940 Today's Date: 06/06/2023 Time: 8594-8559 SLP Time Calculation (min) (ACUTE ONLY): 35 min  Problem List:  Patient Active Problem List   Diagnosis Date Noted   Pain due to onychomycosis of toenails of both feet 09/05/2022   Acute on chronic renal failure (HCC) 10/14/2019   Acute ischemic left MCA stroke (HCC) 10/07/2019   Morbid obesity (HCC)    NSVT (nonsustained ventricular tachycardia) (HCC)    Diabetic peripheral neuropathy (HCC)    Acute ischemic stroke (HCC) 10/04/2019   Hypoglycemia due to insulin  10/04/2019   CKD (chronic kidney disease), stage III (HCC) 10/04/2019   Dehydration 10/04/2019   Dyslipidemia 10/04/2019   Stroke (HCC) 10/04/2019   Ankle fracture, left 02/29/2016   Closed left ankle fracture 02/29/2016   Gout 02/20/2016   Essential hypertension 02/20/2016   Past Medical History:  Past Medical History:  Diagnosis Date   Anemia of chronic disease    Ankle fracture 02/15/2016   Cancer (HCC)    Prostate   Chronic constipation    Chronic kidney disease    stage III   Diabetes mellitus without complication (HCC)    type II    Diabetic peripheral neuropathy (HCC)    Failure to thrive (0-17)    Fracture of left lower leg    Gout    Hyperlipidemia    Hypertension    Morbid obesity (HCC)    Unstable gait    Past Surgical History:  Past Surgical History:  Procedure Laterality Date   IR CT HEAD LTD  06/05/2023   IR INTRAVSC STENT CERV CAROTID W/O EMB-PROT MOD SED INC ANGIO  06/05/2023   IR PERCUTANEOUS ART THROMBECTOMY/INFUSION INTRACRANIAL INC DIAG ANGIO  06/05/2023   IR US  GUIDE VASC ACCESS RIGHT  06/05/2023   ORIF ANKLE FRACTURE Left 02/29/2016   Procedure: OPEN REDUCTION INTERNAL FIXATION (ORIF) ANKLE FRACTURE;  Surgeon: Selinda Belvie Gosling, MD;  Location: [facility];  Service: Orthopedics;  Laterality: Left;   PROSTATE SURGERY     HPI:  Pt is a 83  y.o. male who was admitted as a code stroke on 02/05 due to acute onset of right-sided weakness, right facial droop and aphasia. MRI from 02/06 displayed an evolving acute left MCA infarct, most pronounced at the left insula and left frontal operculum. Pt has intentionally lost 300 pounds in the recent past, with pre-loss weight at ~600Ib. PMHx includes hypertension, diabetes, CKD, prior CVA.   Assessment / Plan / Recommendation Clinical Impression  Pt was seen for SLE to assess language/speech to develop further intervention plans. Pt presents with a severe expressive-receptive aphasia (WAB-R Bedside Score: 40) primarily characterized by problems with repeating words, confrontation naming, understanding complex verbal yes/no questions, and following sequential commands.   Pt's expressive language featured phonemic and semantic paraphasias, along with telegraphic utterances. Receptive language is a strength compared to expressive due to pt performing best on auditory comprehension portion of WAB-R and consistent basic verbal command following. Sequential command following needs to be further reassessed due to pt's impulsivity towards presented objects used in the task. Motor speech was briefly assessed with diadochokinesis (puh) but could not be fully analyzed due to severe aphasia at this time. Perseveration was noted during object naming task (glass of water  for several answers).  SLP provided education to pt's granddaughter about the nature and recovery pattern for aphasia. SLP will f/u with pt to further assess sequential command following and to begin  family education about functional communication strategies with pt.     SLP Assessment  SLP Visit Diagnosis: Aphasia (R47.01)    Recommendations for follow up therapy are one component of a multi-disciplinary discharge planning process, led by the attending physician.  Recommendations may be updated based on patient status, additional functional  criteria and insurance authorization.    Follow Up Recommendations       Assistance Recommended at Discharge  Intermittent Supervision/Assistance  Functional Status Assessment Patient has had a recent decline in their functional status and demonstrates the ability to make significant improvements in function in a reasonable and predictable amount of time.  Frequency and Duration min 2x/week  2 weeks      SLP Evaluation Cognition  Overall Cognitive Status: Impaired/Different from baseline Arousal/Alertness: Awake/alert Orientation Level: Oriented to person;Oriented to place;Oriented to situation;Disoriented to time Attention: Focused;Sustained Focused Attention: Appears intact Sustained Attention: Appears intact Awareness: Impaired Awareness Impairment: Intellectual impairment Behaviors: Impulsive;Perseveration       Comprehension  Auditory Comprehension Overall Auditory Comprehension: Impaired Yes/No Questions: Impaired Basic Immediate Environment Questions: 50-74% accurate Complex Questions: 50-74% accurate Commands: Impaired One Step Basic Commands: 75-100% accurate Two Step Basic Commands: 0-24% accurate Multistep Basic Commands: 0-24% accurate Conversation: Simple EffectiveTechniques: Extra processing time;Repetition;Visual/Gestural cues    Expression Expression Primary Mode of Expression: Verbal Verbal Expression Overall Verbal Expression: Impaired Initiation: Impaired Automatic Speech: Social Response Level of Generative/Spontaneous Verbalization: Word;Phrase Repetition: Impaired Level of Impairment: Word level Naming: Impairment Responsive: Not tested Confrontation: Impaired Convergent: 25-49% accurate Divergent: Not tested Verbal Errors: Phonemic paraphasias;Perseveration;Not aware of errors;Semantic paraphasias Interfering Components: Speech intelligibility   Oral / Motor  Oral Motor/Sensory Function Overall Oral Motor/Sensory Function: Moderate  impairment Facial ROM: Reduced right Facial Symmetry: Abnormal symmetry right Facial Strength: Reduced right Lingual ROM: Within Functional Limits Lingual Symmetry: Within Functional Limits Mandible: Within Functional Limits Motor Speech Overall Motor Speech: Other (comment) (Not tested.)            Waddell Music 06/06/2023, 3:41 PM

## 2023-06-06 NOTE — Progress Notes (Signed)
  Inpatient Rehab Admissions Coordinator :  Per therapy recommendations, patient was screened for CIR candidacy by Ottie Glazier RN MSN.  At this time patient appears to be a potential candidate for CIR. I will place a rehab consult per protocol for full assessment. Please call me with any questions.  Ottie Glazier RN MSN Admissions Coordinator [phone]

## 2023-06-06 NOTE — TOC Initial Note (Signed)
 Transition of Care (TOC) - Initial/Assessment Note    Patient Details  Name: Nathan Proctor. MRN: [id number] Date of Birth: 07/01/1940  Transition of Care [facility]) CM/SW Contact:    Jeoffrey LITTIE Moose, Student-Social Work Phone Number: 06/06/2023, 2:49 PM  Clinical Narrative:                  Pt admitted from home with spouse. TOC following for CIR determination.       Patient Goals and CMS Choice            Expected Discharge Plan and Services       Living arrangements for the past 2 months: Single Family Home                                      Prior Living Arrangements/Services Living arrangements for the past 2 months: Single Family Home Lives with:: Spouse                   Activities of Daily Living      Permission Sought/Granted                  Emotional Assessment       Orientation: : Oriented to Place, Oriented to Self, Oriented to Situation      Admission diagnosis:  Acute ischemic stroke [facility]) [I63.9] Cerebrovascular accident (CVA), unspecified mechanism (HCC) [I63.9] Patient Active Problem List   Diagnosis Date Noted   Pain due to onychomycosis of toenails of both feet 09/05/2022   Acute on chronic renal failure (HCC) 10/14/2019   Acute ischemic left MCA stroke (HCC) 10/07/2019   Morbid obesity (HCC)    NSVT (nonsustained ventricular tachycardia) (HCC)    Diabetic peripheral neuropathy (HCC)    Acute ischemic stroke (HCC) 10/04/2019   Hypoglycemia due to insulin  10/04/2019   CKD (chronic kidney disease), stage III (HCC) 10/04/2019   Dehydration 10/04/2019   Dyslipidemia 10/04/2019   Stroke (HCC) 10/04/2019   Ankle fracture, left 02/29/2016   Closed left ankle fracture 02/29/2016   Gout 02/20/2016   Essential hypertension 02/20/2016   PCP:  Shelda Atlas, MD Pharmacy:   [facility] [id number] ([address]), [place] - [address] [place] ([place]) [place] [address] Phone: [phone] Fax:  [phone]     Social Drivers of Health (SDOH) Social History: SDOH Screenings   Depression (PHQ2-9): Low Risk  (12/03/2019)  Tobacco Use: Medium Risk (03/06/2023)   SDOH Interventions:     Readmission Risk Interventions     No data to display

## 2023-06-06 NOTE — Progress Notes (Signed)
 Initial Nutrition Assessment  DOCUMENTATION CODES:   Non-severe (moderate) malnutrition in context of chronic illness  INTERVENTION:  Initiate tube feeding via Cortrak tube: Osmolite 1.5 at 65 ml/h (1560 ml per day) Start at 35 ml/hr and advance by 10 ml/hr every 6 hours Prosource TF20 60 ml daily Provides 2420 kcal, 117 gm protein, 1188 ml free water  daily  NUTRITION DIAGNOSIS:   Moderate Malnutrition related to chronic illness as evidenced by severe muscle depletion, mild fat depletion, severe fat depletion.  GOAL:   Patient will meet greater than or equal to 90% of their needs  MONITOR:   TF tolerance, Weight trends, Labs, Diet advancement  REASON FOR ASSESSMENT:   Consult Enteral/tube feeding initiation and management  ASSESSMENT:   Pt admitted for acute ischemic stroke with right sides weakness, right facial droop, and aphagia. Pt with PMH of HTN, DM, CKD III, prior CVA w/ no residual deficits.   2/5: s/p IR mechanical thrombectomy; carotid stent placement. received cortrak in expected position of distal stomach 2/6: SLP consult pending  Pt received cortrak yesterday and tube feeding protocol ordered. Labs stable this AM. Will adjust formula to better meet nutrition needs.   Pt's daughter provided nutrition hx during visit. Pt was able to acknowledge us  and say yes/no to general questions but unable to provide history. Daughter reports pt used to be 600 lbs a few years ago and now weighs around 300 lbs. Daughter reports this weight loss was not intentional. Upon physical assessment noted significant muscle and fat depletions throughout body, consistent with long term undernourishment. She reports he began having severe pain in his face and head a little over a year ago, which has negatively impacted his appetite and ability to eat. She reports pt has only been able to eat soft foods for the past year and a half due to severe pain in his cheeks while chewing. She reports  his intake in the past year and a half has been maybe 2 meals a day, which includes soups and mashed potatoes. She reports he has tried Ensure in the past but does not like any protein drinks and will usually only have a few sips out of one. Discussed with daughter that tube feeding formula was adjusted to better meet his needs and explained that the tube will not prevent him from eating when he is cleared to do so.  Daughter reports pt is mobile at home and uses a cane to get around.  Pt currently weighs 291 lbs and has a last recorded weight of 296 lbs from 12/2022. Weight loss is not clinically significant for this time frame. Daughter reports pt's wife is attempting to retrieve medical hx from doctors office.  Meds Reviewed: Vital high protein @ 40 ml/hour for 24 hrs, Prosource 60 ml per tube, Novolog  2-6 units Q4H, Protonix  40 mg IV, Sodium chloride  IV, Senokot PRN  Labs Reviewed: CBG 132-195 (last 24 hrs), A1C 6.1  NUTRITION - FOCUSED PHYSICAL EXAM:  Pt meets criteria for moderate malnutrition. However, suspect more severe malnutrition but difficult to assess with adipose tissue and edema.   Flowsheet Row Most Recent Value  Orbital Region Mild depletion  Upper Arm Region Severe depletion  Thoracic and Lumbar Region Mild depletion  Buccal Region Mild depletion  Temple Region Moderate depletion  Clavicle Bone Region Mild depletion  Clavicle and Acromion Bone Region Mild depletion  Scapular Bone Region Moderate depletion  Dorsal Hand Severe depletion  Patellar Region Severe depletion  Anterior Thigh Region Severe  depletion  Posterior Calf Region Mild depletion  [edema present]  Edema (RD Assessment) Moderate  [BLE]  Hair Reviewed  Eyes Reviewed  Mouth Reviewed  Skin Reviewed  Nails Reviewed       Diet Order:   Diet Order             Diet NPO time specified  Diet effective now                   EDUCATION NEEDS:   Education needs have been addressed  Skin:  Skin  Assessment: Reviewed RN Assessment  Last BM:  none noted  Height:   Ht Readings from Last 1 Encounters:  01/06/23 5' 10 (1.778 m)    Weight:   Wt Readings from Last 1 Encounters:  06/05/23 132.6 kg    Ideal Body Weight:  68.2 kg  BMI:  Body mass index is 41.94 kg/m.  Estimated Nutritional Needs:  Kcal:  2300-2500 Protein:  115-130 g Fluid:  2.4 L   Tinnie Ferraris, MS Dietetic Intern

## 2023-06-06 NOTE — Evaluation (Signed)
 Clinical/Bedside Swallow Evaluation Patient Details  Name: Nathan Proctor. MRN: [id number] Date of Birth: 1940/10/12  Today's Date: 06/06/2023 Time: SLP Start Time (ACUTE ONLY): 1140 SLP Stop Time (ACUTE ONLY): 1200 SLP Time Calculation (min) (ACUTE ONLY): 20 min  Past Medical History:  Past Medical History:  Diagnosis Date   Anemia of chronic disease    Ankle fracture 02/15/2016   Cancer (HCC)    Prostate   Chronic constipation    Chronic kidney disease    stage III   Diabetes mellitus without complication (HCC)    type II    Diabetic peripheral neuropathy (HCC)    Failure to thrive (0-17)    Fracture of left lower leg    Gout    Hyperlipidemia    Hypertension    Morbid obesity (HCC)    Unstable gait    Past Surgical History:  Past Surgical History:  Procedure Laterality Date   IR CT HEAD LTD  06/05/2023   IR INTRAVSC STENT CERV CAROTID W/O EMB-PROT MOD SED INC ANGIO  06/05/2023   IR PERCUTANEOUS ART THROMBECTOMY/INFUSION INTRACRANIAL INC DIAG ANGIO  06/05/2023   IR US  GUIDE VASC ACCESS RIGHT  06/05/2023   ORIF ANKLE FRACTURE Left 02/29/2016   Procedure: OPEN REDUCTION INTERNAL FIXATION (ORIF) ANKLE FRACTURE;  Surgeon: Selinda Belvie Gosling, MD;  Location: [facility];  Service: Orthopedics;  Laterality: Left;   PROSTATE SURGERY     HPI:  Pt is a 83 y.o. male who was admitted as a code stroke on 02/05 due to acute onset of right-sided weakness, right facial droop and aphasia. MRI from 02/06 displayed an evolving acute left MCA infarct, most pronounced at the left insula and left frontal operculum. Pt has intentionally lost 300 pounds in the recent past, with pre-loss weight at ~600Ib. PMHx includes hypertension, diabetes, CKD, prior CVA.    Assessment / Plan / Recommendation  Clinical Impression  Pt was seen for BSE to detemine further intervention recommendations. Pt exhibited two episodes of delayed cough (thin and solid), suspected oral holding with thicker consistencies  (puree/solid), and isolated episodes of throat clearing and anterior spillage. Oral-mech exam was incomplete due to pt's receptive language deficits; however, pt was 85% consistent with following basic verbal commands with visual and tactile cueing. Pt awarness of thicker bolus consistencies appears to be mildly impaired but this cannot be fully assessed at this time. SLP will f/u to assess pt's language/cognition further.   SLP spoke with pt's daughter to discuss recommendations and to answer questions regarding pt's language. Recommend that pt gets a MBS study due to pt displaying signs of aspiration and dysphagia secondary to left MCA stroke.  SLP Visit Diagnosis: Dysphagia, unspecified (R13.10)    Aspiration Risk  Moderate aspiration risk    Diet Recommendation NPO         Other  Recommendations Oral Care Recommendations: Oral care BID    Recommendations for follow up therapy are one component of a multi-disciplinary discharge planning process, led by the attending physician.  Recommendations may be updated based on patient status, additional functional criteria and insurance authorization.  Follow up Recommendations        Assistance Recommended at Discharge    Functional Status Assessment    Frequency and Duration            Prognosis        Swallow Study   General Date of Onset: 06/05/23 HPI: Pt is a 83 y.o. male who was admitted as a code  stroke on 02/05 due to acute onset of right-sided weakness, right facial droop and aphasia. MRI from 02/06 displayed an evolving acute left MCA infarct, most pronounced at the left insula and left frontal operculum. Pt has intentionally lost 300 pounds in the recent past, with pre-loss weight at ~600Ib. PMHx includes hypertension, diabetes, CKD, prior CVA. Type of Study: Bedside Swallow Evaluation Diet Prior to this Study: NPO Temperature Spikes Noted: No Respiratory Status: Room air History of Recent Intubation: Yes Total duration of  intubation (days): 0 days Date extubated: 06/05/23 Behavior/Cognition: Alert;Cooperative;Pleasant mood Oral Cavity Assessment: Within Functional Limits Oral Care Completed by SLP: No Oral Cavity - Dentition: Adequate natural dentition Self-Feeding Abilities: Needs assist Patient Positioning: Upright in bed Baseline Vocal Quality: Hoarse Volitional Cough: Cognitively unable to elicit Volitional Swallow: Able to elicit (Required cueing.)    Oral/Motor/Sensory Function Overall Oral Motor/Sensory Function: Moderate impairment Facial ROM: Reduced right Facial Symmetry: Abnormal symmetry right Facial Strength: Reduced right Lingual ROM: Within Functional Limits Lingual Symmetry: Within Functional Limits Mandible: Within Functional Limits   Ice Chips Ice chips: Within functional limits Presentation: Spoon   Thin Liquid Thin Liquid: Impaired Presentation: Cup;Straw;Self Fed Pharyngeal  Phase Impairments: Cough - Delayed    Nectar Thick Nectar Thick Liquid: Not tested   Honey Thick Honey Thick Liquid: Not tested   Puree Puree: Impaired Presentation: Spoon;Self Fed Oral Phase Functional Implications: Right anterior spillage;Oral holding;Oral residue Pharyngeal Phase Impairments: Throat Clearing - Immediate   Solid     Solid: Impaired Presentation: Self Fed Oral Phase Functional Implications: Oral holding;Impaired mastication Pharyngeal Phase Impairments: Cough - Delayed      Nathan Proctor 06/06/2023,1:05 PM

## 2023-06-06 NOTE — Progress Notes (Signed)
 NAME:  Nathan Maffeo., MRN:  [id number], DOB:  07-Jun-1940, LOS: 1 ADMISSION DATE:  06/05/2023 CONSULTATION DATE:  06/05/2023 REFERRING MD:  Merrianne - Neuro, CHIEF COMPLAINT: Code Stroke, post-NIR   History of Present Illness:  83 year old man who presented to [facility] ED 2/5 via EMS for unresponsiveness and R-sided deficits. PMHx significant for HTN, HLD, prior CVA without residual deficits, T2DM, CKD stage IIIb, prostate CA, gout.  Patient presented to [facility] ED with significant R-sided deficits, aphasia and poor responsiveness. LKW 2200. Code Stroke initiated. On ED arrival, patient was afebrile with HR 86, BP 154/67, RR 20, SpO2 100%. Noted aphasia, R-sided hemiplegia, R-sided facial droop. Labs were notable for WBC 6.8. Hgb 11.9, Plt 181. INR 1.0. Na 142, K 3.8, CO2 20, Cr 1.59 (baseline), LFTs WNL. Ethanol < 10. COVID negative. CT Head with acute L MCA territory infarct affecting L insula/L frontal operculum, small chronic cortically-based infarcts of L frontal/L parietal/L occipital lobes. CTA Head/Neck with abrupt occlusion of proximal M2 L MCA vessel, ?2mm periophthalmic L ICA aneurysm, bilateral ICA plaque with progressive atherosclerotic plaque L carotid bifurcation with proximal L ICA severe, near-occlusive stenosis.   Taken to [facility] emergently for mechanical thrombectomy (de Nile Erichsen). TICI 3 revascularization achieved. L ICA stenosis and plaque rupture noted at the bulb, treated with stenting/angioplasty. Extubated post-procedure and maintained on cangrelor  gtt with plan for transition to DAPT.  PCCM consulted for post-procedure medical management.  Pertinent Medical History:   Past Medical History:  Diagnosis Date   Anemia of chronic disease    Ankle fracture 02/15/2016   Cancer (HCC)    Prostate   Chronic constipation    Chronic kidney disease    stage III   Diabetes mellitus without complication (HCC)    type II    Diabetic peripheral neuropathy (HCC)    Failure to  thrive (0-17)    Fracture of left lower leg    Gout    Hyperlipidemia    Hypertension    Morbid obesity (HCC)    Unstable gait    Significant Hospital Events: Including procedures, antibiotic start and stop dates in addition to other pertinent events   2/5 - Presented to [facility] ED with poor responsiveness, aphasia, R-sided deficits. CT Head with L MCA territory infarct. CTA Head/Neck with occlusion of L MCA M2 segment and severe near-occlusive stenosis of L ICA. Taken to [facility] for MT, stent and angioplasty (de Nile Erichsen). PCCM consulted for post-procedure management.  Interim History / Subjective:  Significantly improved mental/neurologic status this morning  Pleasant and conversant, moving all 4 extremities Oriented x 3, some confusion about situation but easily reoriented Speech much clearer Plan for PT/OT/SLP evaluation today Likely stable for transfer out of ICU  Objective:  Blood pressure 127/63, pulse 65, temperature 99.2 F (37.3 C), temperature source Oral, resp. rate 20, weight 132.6 kg, SpO2 97%.        Intake/Output Summary (Last 24 hours) at 06/06/2023 0901 Last data filed at 06/06/2023 0800 Gross per 24 hour  Intake 2152.39 ml  Output 310 ml  Net 1842.39 ml   Filed Weights   06/05/23 0800  Weight: 132.6 kg   Physical Examination: General: Overall well-appearing elderly man in NAD. Pleasant and conversant. HEENT: [place]/AT, anicteric sclera, PERRL 3mm, moist mucous membranes. Cortrak in place. Neuro: Awake, oriented x 3-4. Responds to verbal stimuli. Following commands consistently. Moves all 4 extremities spontaneously. Strength 4-5/5 in all 4 extremities.  CV: RRR, no m/g/r. PULM: Breathing even and  unlabored on RA. Lung fields CTAB. GI: Soft, nontender, nondistended. Normoactive bowel sounds. Extremities: No LE edema noted. Skin: Warm/dry, no rashes.  Resolved Hospital Problem List:    Assessment & Plan:  Left MCA M2 occlusion L ICA near-occlusive  stenosis CT Head with acute L MCA territory infarct affecting L insula/L frontal operculum, small chronic cortically-based infarcts of L frontal/L parietal/L occipital lobes. CTA Head/Neck with abrupt occlusion of proximal M2 L MCA vessel, ?2mm periophthalmic L ICA aneurysm, bilateral ICA plaque with progressive atherosclerotic plaque L carotid bifurcation with proximal L ICA severe, near-occlusive stenosis. - Stroke team primary, appreciate management - S/p NIR/mechanical thrombectomy/stent with angioplasty - Goal SBP 120-160 - Repeat CT Head 2/5 negative for postprocedural hemorrhage - MRI Brain 2/6 with evolving acute L MCA distribution infarct, associated petechial hemorrhage without frank IPH, underlying chronic microvascular disease with small remote R cerebellar infarct - Echo 2/5 with EF 30%, global hypokinesis, trivial MR - Neuroprotective measures: HOB > 30 degrees, normoglycemia, normothermia, electrolytes WNL - PT/OT/SLP evaluations today, 2/6; Cortrak in place for now  HTN HLD Lipid panel with elevated LDL, otherwise unremarkable. S/p cangrelor  gtt post-procedure. - Brilinta  BID + ASA, monitor closely in the setting of petechial hemorrhage on MRI - Resume home antihypertensives as clinically appropriate (Norvasc , Coreg , Cozaar ) - Resume statin  T2DM - SSI - CBGs Q4H, transition to ACHS when tolerating PO (due for SLP eval today, 2/6) - Goal CBG 140-180 - Hold home Jardiance  for now  CKD stage IIIb - Trend BMP - Replete electrolytes as indicated - Monitor I&Os - Avoid nephrotoxic agents as able - Ensure adequate renal perfusion  Patient has made significant progress since NIR intervention and is doing well overall; he is likely stable for transfer out of ICU but will defer to Stroke team (primary). PCCM will sign off, barring any clinical changes. Thank you for involving us  in this patient's care. Please do not hesitate to reach out if PCCM can be of further  assistance.  Best Practice: (right click and Reselect all SmartList Selections daily)   Per Primary Team  Critical care time: N/A   Corean CHRISTELLA Ilah DEVONNA [facility] 06/06/23 9:01 AM  Please see [email or web address] for pager details.  From 7A-7P if no response, please call [phone] After hours, please call ELink [phone]

## 2023-06-06 NOTE — Plan of Care (Signed)
  Problem: Education: Goal: Knowledge of disease or condition will improve Outcome: Progressing   Problem: Ischemic Stroke/TIA Tissue Perfusion: Goal: Complications of ischemic stroke/TIA will be minimized Outcome: Progressing   Problem: Cardiovascular: Goal: Vascular access site(s) Level 0-1 will be maintained Outcome: Progressing

## 2023-06-06 NOTE — TOC Progression Note (Signed)
 Transition of Care (TOC) - Progression Note    Patient Details  Name: Nathan Proctor. MRN: [id number] Date of Birth: 1940-06-04  Transition of Care [facility]) CM/SW Contact  Inocente GORMAN Kindle, LCSW Phone Number: 06/06/2023, 3:39 PM  Clinical Narrative:    Per CIR, family prefers SNF. CSW will send out referrals once Cortrak is removed. Pasrr already in place and Fl2 has been started. [facility] buildings and [employer] centers [employer]) are the closest in network.    Expected Discharge Plan: Skilled Nursing Facility Barriers to Discharge: Continued Medical Work up, English As A Second Language Teacher, SNF Pending bed offer (Cortrak)  Expected Discharge Plan and Services In-house Referral: Clinical Social Work   Post Acute Care Choice: Skilled Nursing Facility Living arrangements for the past 2 months: Single Family Home                                       Social Determinants of Health (SDOH) Interventions SDOH Screenings   Depression (PHQ2-9): Low Risk  (12/03/2019)  Tobacco Use: Medium Risk (03/06/2023)    Readmission Risk Interventions     No data to display

## 2023-06-06 NOTE — Evaluation (Signed)
 Physical Therapy Evaluation Patient Details Name: Nathan Proctor. MRN: [id number] DOB: 03/30/1941 Today's Date: 06/06/2023  History of Present Illness  83 y.o. male who was brought in by EMS 06/05/23 as a code stroke due to acute onset of right-sided weakness, right facial droop and aphasia. CTH shows acute left MCA territory infarct; NIHSS 28; to IR for thrombectomy/stenting/angioplasty; PMH hypertension, diabetes, CKD, prior CVA with no residual deficits  Clinical Impression   Pt admitted secondary to problem above with deficits below. PTA patient was living at home with wife and son. He has 4 tall steps to enter house without a rail. He was using quad cane to ambulate short distances due to severe head pain when upright (which progressed to decr endurance).  Pt currently requires min assist for sit to stand with RW and transfer to chair. Due to pt size and lines, recommend +2 for ambulation.  Anticipate patient will benefit from PT to address problems listed below.Will continue to follow acutely to maximize functional mobility independence and safety.  Patient will benefit from continued inpatient follow up therapy, >3 hours/day          If plan is discharge home, recommend the following: Two people to help with walking and/or transfers;Direct supervision/assist for medications management;Direct supervision/assist for financial management;Assist for transportation;Help with stairs or ramp for entrance;Supervision due to cognitive status   Can travel by private vehicle        Equipment Recommendations None recommended by PT  Recommendations for Other Services  Rehab consult    Functional Status Assessment Patient has had a recent decline in their functional status and demonstrates the ability to make significant improvements in function in a reasonable and predictable amount of time.     Precautions / Restrictions Precautions Precautions: Fall      Mobility  Bed  Mobility Overal bed mobility: Needs Assistance Bed Mobility: Rolling, Sidelying to Sit Rolling: Contact guard assist, Used rails Sidelying to sit: Contact guard assist, Used rails       General bed mobility comments: on ICU/air bed with guarding for safety    Transfers Overall transfer level: Needs assistance Equipment used: Rolling walker (2 wheels) Transfers: Sit to/from Stand, Bed to chair/wheelchair/BSC Sit to Stand: Min assist   Step pivot transfers: Min assist       General transfer comment: boosting and steadying assist    Ambulation/Gait               General Gait Details: recommend +2 for safety/lines  Stairs            Wheelchair Mobility     Tilt Bed    Modified Rankin (Stroke Patients Only) Modified Rankin (Stroke Patients Only) Pre-Morbid Rankin Score: Moderate disability Modified Rankin: Severe disability     Balance Overall balance assessment: Mild deficits observed, not formally tested                                           Pertinent Vitals/Pain Pain Assessment Pain Assessment: Faces Faces Pain Scale: No hurt    Home Living Family/patient expects to be discharged to:: Private residence Living Arrangements: Spouse/significant other;Children (son with schizophrenia) Available Help at Discharge: Family;Available 24 hours/day (wife nor son cannot provide physical assist) Type of Home: House Home Access: Stairs to enter Entrance Stairs-Rails: None Entrance Stairs-Number of Steps: 4 (*tall per daughter)   Home Layout: One  level Home Equipment: Rollator (4 wheels);Cane - quad;BSC/3in1;Hospital bed;Grab bars - toilet;Shower seat - built in;Wheelchair - manual Additional Comments: daughter reports he had progressed  from rollator to cane; they recently had to tear down the ramp due to unsafe and plan to rebuild;    Prior Function Prior Level of Function : Needs assist             Mobility Comments: using  quad cane (vs previously rollator); assist up/down steps; limited to ~15 ft of ambulation due to severe pain left side of head when he was up ADLs Comments: only showers when HH nurse/?aide there to assist     Extremity/Trunk Assessment   Upper Extremity Assessment Upper Extremity Assessment: Defer to OT evaluation    Lower Extremity Assessment Lower Extremity Assessment: Difficult to assess due to impaired cognition (impaired communication)    Cervical / Trunk Assessment Cervical / Trunk Assessment: Other exceptions Cervical / Trunk Exceptions: overweight  Communication   Communication Communication: Difficulty communicating thoughts/reduced clarity of speech;Hearing impairment Cueing Techniques: Verbal cues  Cognition Arousal: Alert Behavior During Therapy: WFL for tasks assessed/performed Overall Cognitive Status: Difficult to assess                                 General Comments: followed simple commands (2 step NT); answers yes to all questions; very little verbal output; could not state name or DOB        General Comments General comments (skin integrity, edema, etc.): Daughter present and provided home set-up and prior functional status. She is very concerned that her very petite mother will injure herself again while trying to help him.    Exercises     Assessment/Plan    PT Assessment Patient needs continued PT services  PT Problem List Decreased strength;Decreased activity tolerance;Decreased balance;Decreased mobility;Decreased knowledge of use of DME;Decreased safety awareness;Decreased knowledge of precautions;Obesity       PT Treatment Interventions DME instruction;Gait training;Stair training;Functional mobility training;Therapeutic activities;Therapeutic exercise;Balance training;Neuromuscular re-education;Patient/family education    PT Goals (Current goals can be found in the Care Plan section)  Acute Rehab PT Goals Patient Stated Goal:  unable tp state PT Goal Formulation: Patient unable to participate in goal setting Time For Goal Achievement: 06/20/23 Potential to Achieve Goals: Good    Frequency Min 4X/week     Co-evaluation               AM-PAC PT 6 Clicks Mobility  Outcome Measure Help needed turning from your back to your side while in a flat bed without using bedrails?: A Little Help needed moving from lying on your back to sitting on the side of a flat bed without using bedrails?: A Little Help needed moving to and from a bed to a chair (including a wheelchair)?: A Little Help needed standing up from a chair using your arms (e.g., wheelchair or bedside chair)?: A Little Help needed to walk in hospital room?: Total Help needed climbing 3-5 steps with a railing? : Total 6 Click Score: 14    End of Session Equipment Utilized During Treatment: Gait belt Activity Tolerance: Patient tolerated treatment well (BP stable) Patient left: in chair;with family/visitor present;Other (comment) (OT present) Nurse Communication: Mobility status PT Visit Diagnosis: Other abnormalities of gait and mobility (R26.89);Muscle weakness (generalized) (M62.81)    Time: 9057-8986 PT Time Calculation (min) (ACUTE ONLY): 31 min   Charges:   PT Evaluation $PT Eval Moderate Complexity:  1 Mod   PT General Charges $$ ACUTE PT VISIT: 1 Visit          Macario RAMAN, PT Acute Rehabilitation Services  Office [phone]   Macario SHAUNNA Soja 06/06/2023, 10:29 AM

## 2023-06-06 NOTE — Progress Notes (Signed)
 Speech and strength improving throughout the night. When asked him where he is at, he responded, Im in the bed.  He responds to yes and no questions appropriately. Follows all commands appropriately. Daughter is at bedside. Rt pedal pulse stronger via doppler this morning. Continuing to monitor. Call light in reach.

## 2023-06-07 ENCOUNTER — Inpatient Hospital Stay (HOSPITAL_COMMUNITY): Payer: No Typology Code available for payment source

## 2023-06-07 DIAGNOSIS — I639 Cerebral infarction, unspecified: Secondary | ICD-10-CM | POA: Diagnosis not present

## 2023-06-07 DIAGNOSIS — N179 Acute kidney failure, unspecified: Secondary | ICD-10-CM

## 2023-06-07 DIAGNOSIS — I5021 Acute systolic (congestive) heart failure: Secondary | ICD-10-CM

## 2023-06-07 LAB — CBC
HCT: 37.1 % — ABNORMAL LOW (ref 39.0–52.0)
Hemoglobin: 11.8 g/dL — ABNORMAL LOW (ref 13.0–17.0)
MCH: 31 pg (ref 26.0–34.0)
MCHC: 31.8 g/dL (ref 30.0–36.0)
MCV: 97.4 fL (ref 80.0–100.0)
Platelets: 151 10*3/uL (ref 150–400)
RBC: 3.81 MIL/uL — ABNORMAL LOW (ref 4.22–5.81)
RDW: 13.9 % (ref 11.5–15.5)
WBC: 8.4 10*3/uL (ref 4.0–10.5)
nRBC: 0 % (ref 0.0–0.2)

## 2023-06-07 LAB — GLUCOSE, CAPILLARY
Glucose-Capillary: 158 mg/dL — ABNORMAL HIGH (ref 70–99)
Glucose-Capillary: 186 mg/dL — ABNORMAL HIGH (ref 70–99)
Glucose-Capillary: 163 mg/dL — ABNORMAL HIGH (ref 70–99)
Glucose-Capillary: 184 mg/dL — ABNORMAL HIGH (ref 70–99)
Glucose-Capillary: 180 mg/dL — ABNORMAL HIGH (ref 70–99)

## 2023-06-07 LAB — BASIC METABOLIC PANEL
Anion gap: 13 (ref 5–15)
BUN: 31 mg/dL — ABNORMAL HIGH (ref 8–23)
CO2: 20 mmol/L — ABNORMAL LOW (ref 22–32)
Calcium: 9.5 mg/dL (ref 8.9–10.3)
Chloride: 110 mmol/L (ref 98–111)
Creatinine, Ser: 1.98 mg/dL — ABNORMAL HIGH (ref 0.61–1.24)
GFR, Estimated: 33 mL/min — ABNORMAL LOW (ref >60)
Glucose, Bld: 163 mg/dL — ABNORMAL HIGH (ref 70–99)
Potassium: 4.2 mmol/L (ref 3.5–5.1)
Sodium: 143 mmol/L (ref 135–145)

## 2023-06-07 MED ORDER — IPRATROPIUM-ALBUTEROL 0.5-2.5 (3) MG/3ML IN SOLN: 3.0000 mL | Freq: Four times a day (QID) | RESPIRATORY_TRACT | Status: DC | PRN

## 2023-06-07 MED ORDER — FUROSEMIDE 10 MG/ML IJ SOLN
80.0000 mg | INTRAMUSCULAR | Status: AC
  Administered 2023-06-07: 80 mg via INTRAVENOUS
  Filled 2023-06-07: qty 8

## 2023-06-07 MED ORDER — ALBUTEROL SULFATE (2.5 MG/3ML) 0.083% IN NEBU
INHALATION_SOLUTION | RESPIRATORY_TRACT | Status: AC
  Administered 2023-06-07: 2.5 mg
  Filled 2023-06-07: qty 6

## 2023-06-07 NOTE — Progress Notes (Signed)
 Physical Therapy Treatment Patient Details Name: Nathan Proctor. MRN: [id number] DOB: December 04, 1940 Today's Date: 06/07/2023   History of Present Illness 83 y.o. male who was brought in by EMS 06/05/23 as a code stroke due to acute onset of right-sided weakness, right facial droop and aphasia. CTH shows acute left MCA territory infarct; NIHSS 28; to IR for thrombectomy/stenting/angioplasty; PMH hypertension, diabetes, CKD, prior CVA with no residual deficits    PT Comments  Patient progressing to hallway ambulation with +2 for chair follow for safety.  Patient initially demonstrating trunk stiffness and c/o back pain though improved with mobility/ambulation.  Seated rest needed though pt not verbalizing specific fatigue, then ambulating a little more prior to fatigue.  Patient remains appropriate for inpatient rehab (<3 hours/day with family unable to provide a lot of support.)   PT will continue to follow in the acute setting.     If plan is discharge home, recommend the following: Two people to help with walking and/or transfers;Direct supervision/assist for medications management;Direct supervision/assist for financial management;Assist for transportation;Help with stairs or ramp for entrance;Supervision due to cognitive status   Can travel by private vehicle     No  Equipment Recommendations  None recommended by PT    Recommendations for Other Services       Precautions / Restrictions Precautions Precautions: Fall Precaution Comments: drooling/incontinent; SBP 120-160     Mobility  Bed Mobility Overal bed mobility: Needs Assistance Bed Mobility: Supine to Sit   Sidelying to sit: HOB elevated, Mod assist       General bed mobility comments: increased time, some lifting help for trunk reports back pain    Transfers Overall transfer level: Needs assistance Equipment used: Rolling walker (2 wheels) Transfers: Sit to/from Stand Sit to Stand: Min assist, +2  safety/equipment           General transfer comment: mod cues for hand placement with all transitions; intial lifting help to stand though improved to less support with subsequent sit to stands    Ambulation/Gait Ambulation/Gait assistance: Min assist, +2 safety/equipment Gait Distance (Feet): 40 Feet (& 20') Assistive device: Rolling walker (2 wheels) Gait Pattern/deviations: Step-to pattern, Step-through pattern, Decreased stride length, Shuffle, Trunk flexed       General Gait Details: slow pace, mildly flexed at hips and pt stopping to sit to rest in hallway   Stairs             Wheelchair Mobility     Tilt Bed    Modified Rankin (Stroke Patients Only) Modified Rankin (Stroke Patients Only) Pre-Morbid Rankin Score: Moderate disability Modified Rankin: Moderately severe disability     Balance Overall balance assessment: Needs assistance Sitting-balance support: Feet supported Sitting balance-Leahy Scale: Fair     Standing balance support: Bilateral upper extremity supported Standing balance-Leahy Scale: Poor Standing balance comment: UE support for balance in standing                            Cognition Arousal: Alert Behavior During Therapy: Flat affect Overall Cognitive Status: Impaired/Different from baseline Area of Impairment: Attention, Safety/judgement, Following commands, Problem solving                   Current Attention Level: Sustained   Following Commands: Follows one step commands with increased time Safety/Judgement: Decreased awareness of safety, Decreased awareness of deficits   Problem Solving: Slow processing, Requires verbal cues, Requires tactile cues, Decreased initiation, Difficulty sequencing  Exercises      General Comments General comments (skin integrity, edema, etc.): family in the room and supportive, VSS with mobility and pt with periwick noted some incontinence with ambulation (On  Lasix )      Pertinent Vitals/Pain Pain Assessment Pain Assessment: No/denies pain    Home Living                          Prior Function            PT Goals (current goals can now be found in the care plan section) Progress towards PT goals: Progressing toward goals    Frequency    Min 4X/week      PT Plan      Co-evaluation              AM-PAC PT 6 Clicks Mobility   Outcome Measure  Help needed turning from your back to your side while in a flat bed without using bedrails?: A Little Help needed moving from lying on your back to sitting on the side of a flat bed without using bedrails?: A Lot Help needed moving to and from a bed to a chair (including a wheelchair)?: A Little Help needed standing up from a chair using your arms (e.g., wheelchair or bedside chair)?: A Lot Help needed to walk in hospital room?: A Little Help needed climbing 3-5 steps with a railing? : Total 6 Click Score: 14    End of Session Equipment Utilized During Treatment: Gait belt Activity Tolerance: Patient tolerated treatment well Patient left: in chair;with call bell/phone within reach;with chair alarm set;with family/visitor present Nurse Communication: Mobility status PT Visit Diagnosis: Other abnormalities of gait and mobility (R26.89);Muscle weakness (generalized) (M62.81)     Time: 8887-8861 PT Time Calculation (min) (ACUTE ONLY): 26 min  Charges:    $Gait Training: 8-22 mins $Therapeutic Activity: 8-22 mins PT General Charges $$ ACUTE PT VISIT: 1 Visit                     Micheline Portal, PT Acute Rehabilitation Services [phone] 06/07/2023    Montie Portal 06/07/2023, 12:04 PM

## 2023-06-07 NOTE — Evaluation (Signed)
 Modified Barium Swallow Study  Patient Details  Name: Nathan Proctor. MRN: [id number] Date of Birth: Mar 17, 1941  Today's Date: 06/07/2023  Modified Barium Swallow completed.  Full report located under Chart Review in the Imaging Section.  History of Present Illness Pt is a 83 y.o. male who was admitted as a code stroke on 02/05 due to acute onset of right-sided weakness, right facial droop and aphasia. MRI from 02/06 displayed an evolving acute left MCA infarct, most pronounced at the left insula and left frontal operculum. Pt has intentionally lost 300 pounds in the recent past, with pre-loss weight at ~600Ib. PMHx includes hypertension, diabetes, CKD, prior CVA.   Clinical Impression Pt presented with a moderate-severe oropharyngeal dysphagia (DIGEST Score: 3) primarily characterized by silent aspiration of thin liquid, silent penetration of nectar-thick liquid, diffuse pharyngeal residue collection during thin/nectar-thick consistencies, and posterior-escape of bolus across multiple consistencies.   Pt positioning was a potential limitation for examination due to consistent posterior head position. More anterior head positioning during nectar-thick liquid displayed less pharyngeal residue, increased hyo-laryngeal excursion, and overall better airway safety. This positioning was not present throughout due to pt's receptive language deficits to understand cues. Alternating between honey-thick and puree consistencies provided more oropharyngeal bolus clearance and no signs of aspiration. Solid bolus administration ended early due to pt not chewing bolus with no oral transit. A pocket of residue during multiple consistencies was noted above UES that resembles a pharyngocele. Cough and multiple swallows were attempted with pt, showing mild-moderate success and requiring max verbal, visual, and tactile cueing.    Recommendations include a dysphagia 1 (puree) and honey-thick liquid diet to  optimize pt's airway safety and potential for meeting nutritional needs. Slow rate, small bites/sips, natural head posturing, and multiple swallows are compensatory strategies that displayed best efficiency with pt. F/u with SLP for family education, compensatory strategy training, and upgraded diet trials is recommended to increase pt safety and maximize QOL for pt.  Factors that may increase risk of adverse event in presence of aspiration Noe & Lianne 2021): Weak cough  Swallow Evaluation Recommendations Recommendations: PO diet PO Diet Recommendation: Dysphagia 1 (Pureed);Moderately thick liquids (Level 3, honey thick) Liquid Administration via: Spoon Medication Administration: Crushed with puree Supervision: Full supervision/cueing for swallowing strategies;Full assist for feeding Swallowing strategies  : Slow rate;Small bites/sips;Multiple dry swallows after each bite/sip (Pt needs to be in a neutral head position (no posterior head tilt).) Postural changes: Position pt fully upright for meals;Stay upright 30-60 min after meals Oral care recommendations: Oral care BID (2x/day);Staff/trained caregiver to provide oral care Caregiver Recommendations: Have oral suction available      Waddell Music 06/07/2023,11:00 AM

## 2023-06-07 NOTE — Progress Notes (Addendum)
 STROKE TEAM PROGRESS NOTE   SUBJECTIVE (INTERVAL HISTORY) Overall his condition is gradually improving.  Patient sitting in bed, awake alert, still has intermittent coughing and moderate dysarthria.  Status post ICA stenting and thrombectomy, now on aspirin  and Plavix . Per conversation with CIR admit team, family would prefer SNF for rehab. Cardiology following for GDMT management, creatinine elevated today 1.98, 2.2 L positive on I&Os, lasix  80mg  x1 today. Strict I&Os   OBJECTIVE Temp:  [98 F (36.7 C)-101 F (38.3 C)] 99.7 F (37.6 C) (02/07 0800) Pulse Rate:  [61-88] 88 (02/07 0900) Cardiac Rhythm: Normal sinus rhythm (02/07 0800) Resp:  [17-26] 19 (02/07 0900) BP: (96-164)/(54-124) 119/57 (02/07 0900) SpO2:  [95 %-100 %] 98 % (02/07 0900)  Recent Labs  Lab 06/06/23 1517 06/06/23 2006 06/06/23 2352 06/07/23 0353 06/07/23 0743  GLUCAP 139* 154* 137* 158* 186*   Recent Labs  Lab 06/05/23 0815 06/05/23 0817 06/05/23 1500 06/05/23 1811 06/06/23 0557 06/06/23 1648 06/07/23 0413  NA 142 142  --   --  139  --  143  K 3.8 3.7  --   --  5.0  --  4.2  CL 109 108  --   --  110  --  110  CO2 20*  --   --   --  16*  --  20*  GLUCOSE 203* 201*  --   --  161*  --  163*  BUN 18 21  --   --  23  --  31*  CREATININE 1.59* 1.60*  --   --  1.66*  --  1.98*  CALCIUM  9.4  --   --   --  8.7*  --  9.5  MG  --   --  1.8 1.9 2.3 2.2  --   PHOS  --   --  2.5 1.9* 3.0 3.4  --    Recent Labs  Lab 06/05/23 0815 06/06/23 0557  AST 20 26  ALT 13 12  ALKPHOS 76 72  BILITOT 1.2 1.1  PROT 6.5 6.1*  ALBUMIN  3.6 3.3*   Recent Labs  Lab 06/05/23 0815 06/05/23 0817 06/06/23 0557 06/07/23 0413  WBC 6.8  --  10.0 8.4  NEUTROABS 3.1  --   --   --   HGB 11.9* 12.6* 11.4* 11.8*  HCT 37.8* 37.0* 36.3* 37.1*  MCV 97.4  --  97.6 97.4  PLT 181  --  177 151   No results for input(s): CKTOTAL, CKMB, CKMBINDEX, TROPONINI in the last 168 hours. Recent Labs    06/05/23 0815  06/06/23 0557  LABPROT 13.7 16.2*  INR 1.0 1.3*   Recent Labs    06/05/23 1653  COLORURINE YELLOW  LABSPEC >1.046*  PHURINE 5.0  GLUCOSEU NEGATIVE  HGBUR NEGATIVE  BILIRUBINUR NEGATIVE  KETONESUR 5*  PROTEINUR NEGATIVE  NITRITE NEGATIVE  LEUKOCYTESUR NEGATIVE       Component Value Date/Time   CHOL 183 06/06/2023 0557   TRIG 81 06/06/2023 0557   HDL 60 06/06/2023 0557   CHOLHDL 3.1 06/06/2023 0557   VLDL 16 06/06/2023 0557   LDLCALC 107 (H) 06/06/2023 0557   LDLCALC 223 (H) 05/31/2021 0000   Lab Results  Component Value Date   HGBA1C 6.1 (H) 06/05/2023      Component Value Date/Time   LABOPIA NONE DETECTED 06/05/2023 1653   COCAINSCRNUR NONE DETECTED 06/05/2023 1653   LABBENZ NONE DETECTED 06/05/2023 1653   AMPHETMU NONE DETECTED 06/05/2023 1653   THCU NONE DETECTED 06/05/2023 1653  LABBARB NONE DETECTED 06/05/2023 1653    Recent Labs  Lab 06/05/23 0815  ETH <10    I have personally reviewed the radiological images below and agree with the radiology interpretations.  CT HEAD WO CONTRAST ( ) Result Date: 06/07/2023 CLINICAL DATA:  83 year old male status post code stroke presentation, left MCA M2 occlusion, with left MCA infarct with confluent petechial hemorrhage. EXAM: CT HEAD WITHOUT CONTRAST TECHNIQUE: Contiguous axial images were obtained from the base of the skull through the vertex without intravenous contrast. RADIATION DOSE REDUCTION: This exam was performed according to the departmental dose-optimization program which includes automated exposure control, adjustment of the mA and/or kV according to patient size and/or use of iterative reconstruction technique. COMPARISON:  Brain MRI yesterday.  Head CT 06/05/2023. FINDINGS: Brain: Confluent mixed density infarct in the left MCA middle division, epicenter at the insula and operculum. Confluent petechial hemorrhage on series 3, image 16 appears stable from the MRI. Superimposed trace subarachnoid blood is  difficult to exclude by CT (series 3, image 13), but was not apparent on MRI. Stable mild mass effect on the left lateral ventricle with only trace rightward midline shift (series 3, image 16). No ventriculomegaly. Elsewhere gray-white differentiation is stable from the presentation CT. Basilar cisterns remain patent. Vascular: Resolved hyperdense left MCA since presentation. Calcified atherosclerosis at the skull base. Skull: Stable, intact. Sinuses/Orbits: Visualized paranasal sinuses and mastoids are stable and well aerated. Other: Left nasoenteric tube now in place. Stable orbit and scalp soft tissues. IMPRESSION: 1. Stable by CT confluent Left MCA middle division infarct with petechial hemorrhage (Heidelberg classification 1b: HI2, confluent petechiae, no mass effect). Questionable trace superimposed SAH, but was not apparent on MRI. 2. Stable minimal intracranial mass effect. 3. No new intracranial abnormality. Electronically Signed   By: VEAR Hurst M.D.   On: 06/07/2023 05:37   MR BRAIN WO CONTRAST Result Date: 06/06/2023 CLINICAL DATA:  Follow-up examination for stroke. EXAM: MRI HEAD WITHOUT CONTRAST TECHNIQUE: Multiplanar, multiecho pulse sequences of the brain and surrounding structures were obtained without intravenous contrast. COMPARISON:  Comparison made to multiple previous exams from 06/05/2023. FINDINGS: Brain: Examination degraded by motion artifact. Cerebral volume within normal limits for age. Patchy T2/FLAIR hyperintensity involving the periventricular deep white matter both cerebral hemispheres, consistent with chronic small vessel ischemic disease, mild in nature. Small remote infarct noted within the right cerebellum. Confluent restricted diffusion involving the left insula and left frontal operculum, consistent with evolving acute left MCA distribution infarct. Area of infarction measures up to approximately 6 cm in AP diameter. Prominent associated susceptibility artifact, consistent with  petechial hemorrhage (series 7, image 61) ( Heidelberg classification 1b: HI2, confluent petechiae, no mass effect. No frank or organized hematoma evident by MRI. No significant regional mass effect. Few additional small foci of infarction noted within the left parieto-occipital region posteriorly (series 3, images 40, 29). Apparent diffusion signal along the right frontal parafalcine region felt to be consistent with artifact related to dural calcification. Note made of a few additional chronic micro hemorrhages within the left cerebellum and right thalamus. No mass lesion or midline shift. Ventricles normal size without hydrocephalus. No extra-axial fluid collection. Pituitary gland suprasellar region within normal limits. Vascular: Major intracranial vascular flow voids are maintained. Skull and upper cervical spine: Craniocervical junction within normal limits. Bone marrow signal intensity overall within normal limits. No scalp soft tissue abnormality. Sinuses/Orbits: Prior bilateral ocular lens replacement. Paranasal sinuses are clear. No mastoid effusion. Other: None. IMPRESSION: 1. Evolving acute left MCA  distribution infarct, most pronounced at the left insula and left frontal operculum. Prominent associated petechial hemorrhage without frank intraparenchymal hematoma (Heidelberg classification 1b: HI2, confluent petechiae, no mass effect). 2. Few additional small foci of acute infarction within the left parieto-occipital region posteriorly. 3. Underlying mild chronic microvascular ischemic disease with small remote right cerebellar infarct. Electronically Signed   By: Morene Hoard M.D.   On: 06/06/2023 03:12   ECHOCARDIOGRAM COMPLETE Result Date: 06/05/2023    ECHOCARDIOGRAM REPORT   Patient Name:   Alver Leete. Date of Exam: 06/05/2023 Medical Rec #:  [id number]              Height:       70.0 in Accession #:    [id number]             Weight:       292.3 lb Date of Birth:  22-Mar-1941               BSA:          2.453 m Patient Age:    82 years               BP:           129/72 mmHg Patient Gender: M                      HR:           89 bpm. Exam Location:  Inpatient Procedure: 2D Echo, Color Doppler, Cardiac Doppler and Intracardiac            Opacification Agent Indications:    Stroke I63.9  History:        Patient has prior history of Echocardiogram examinations, most                 recent 10/06/2019. Risk Factors:Diabetes, Hypertension and                 Dyslipidemia.  Sonographer:    Tinnie Gosling RDCS Referring Phys: ERIN C LEHNER IMPRESSIONS  1. Left ventricular ejection fraction, by estimation, is 30%. The left ventricle has moderately decreased function. The left ventricle demonstrates global hypokinesis. There is mild left ventricular hypertrophy.  2. Right ventricular systolic function is normal. The right ventricular size is normal.  3. Trivial mitral valve regurgitation.  4. The aortic valve is tricuspid. Aortic valve regurgitation is not visualized.  5. The inferior vena cava is normal in size with greater than 50% respiratory variability, suggesting right atrial pressure of 3 mmHg. Comparison(s): The left ventricular function is worsened. FINDINGS  Left Ventricle: Left ventricular ejection fraction, by estimation, is 30%. The left ventricle has moderately decreased function. The left ventricle demonstrates global hypokinesis. Definity  contrast agent was given IV to delineate the left ventricular endocardial borders. The left ventricular internal cavity size was normal in size. There is mild left ventricular hypertrophy. Right Ventricle: The right ventricular size is normal. Right vetricular wall thickness was not assessed. Right ventricular systolic function is normal. Left Atrium: Left atrial size was normal in size. Right Atrium: Right atrial size was normal in size. Pericardium: There is no evidence of pericardial effusion. Mitral Valve: There is mild thickening of the mitral valve  leaflet(s). Mild to moderate mitral annular calcification. Trivial mitral valve regurgitation. Tricuspid Valve: The tricuspid valve is normal in structure. Tricuspid valve regurgitation is trivial. Aortic Valve: The aortic valve is tricuspid. Aortic valve regurgitation is not visualized. Pulmonic Valve: The pulmonic  valve was normal in structure. Pulmonic valve regurgitation is not visualized. Aorta: The aortic root and ascending aorta are structurally normal, with no evidence of dilitation. Venous: The inferior vena cava is normal in size with greater than 50% respiratory variability, suggesting right atrial pressure of 3 mmHg. IAS/Shunts: The interatrial septum was not assessed.  LEFT VENTRICLE PLAX 2D LVIDd:         5.00 cm   Diastology LVIDs:         4.40 cm   LV e' lateral: 5.33 cm/s LV PW:         1.20 cm LV IVS:        1.10 cm LVOT diam:     2.30 cm LV SV:         55 LV SV Index:   23 LVOT Area:     4.15 cm  RIGHT VENTRICLE RV S prime:     14.40 cm/s LEFT ATRIUM         Index LA diam:    4.60 cm 1.88 cm/m  AORTIC VALVE LVOT Vmax:   65.50 cm/s LVOT Vmean:  45.200 cm/s LVOT VTI:    0.133 m  AORTA Ao Root diam: 3.00 cm Ao Asc diam:  3.50 cm  SHUNTS Systemic VTI:  0.13 m Systemic Diam: 2.30 cm Vina Gull MD Electronically signed by Vina Gull MD Signature Date/Time: 06/05/2023/9:33:38 PM    Final    DG Abd Portable 1V Result Date: 06/05/2023 CLINICAL DATA:  Feeding tube placement. EXAM: PORTABLE ABDOMEN - 1 VIEW COMPARISON:  None Available. FINDINGS: Distal tip of feeding tube is seen in expected position of distal stomach. IMPRESSION: Distal tip of feeding tube seen in expected position of distal stomach. Electronically Signed   By: Lynwood Landy Raddle M.D.   On: 06/05/2023 15:49   CT HEAD WO CONTRAST Result Date: 06/05/2023 CLINICAL DATA:  Stroke, follow-up. Status post intracranial mechanical thrombectomy and stenting of the left ICA bifurcation. EXAM: CT HEAD WITHOUT CONTRAST TECHNIQUE: Contiguous axial  images were obtained from the base of the skull through the vertex without intravenous contrast. RADIATION DOSE REDUCTION: This exam was performed according to the departmental dose-optimization program which includes automated exposure control, adjustment of the mA and/or kV according to patient size and/or use of iterative reconstruction technique. COMPARISON:  CT head without contrast and CT angio head and neck 06/05/2023. By plain CT 06/05/2023. FINDINGS: Brain: The study is mildly degraded by patient motion. The left insular and left opercular infarct is somewhat obscured by patient motion. The cortex is slightly hyperdense, potentially reflecting reperfusion. No hemorrhage is present. Basal ganglia are intact. Subcortical white matter hypoattenuation in the high left frontal lobe is new. Mild right-sided white matter disease is stable. Basal ganglia are intact. A remote lacunar infarct is again noted in the right cerebellum. The brainstem and cerebellum are otherwise within normal limits. Vascular: Atherosclerotic calcifications are present within the cavernous internal carotid arteries and at the normal origin of both vertebral arteries. No hyperdense vessel is present. Skull: Calvarium is intact. No focal lytic or blastic lesions are present. No significant extracranial soft tissue lesion is present. Sinuses/Orbits: The paranasal sinuses and mastoid air cells are clear. Bilateral lens replacements are noted. Globes and orbits are otherwise unremarkable. IMPRESSION: 1. The left insular and left opercular infarct is somewhat obscured by patient motion. 2. The cortex is slightly hyperdense, potentially reflecting reperfusion. 3. No hemorrhage. 4. Subcortical white matter hypoattenuation in the high left frontal lobe is new. This  may reflect ischemic changes or edema related to the reperfusion. 5. Remote lacunar infarct of the right cerebellum. 6. Stable mild right-sided white matter disease. This likely  reflects the sequela of chronic microvascular ischemia. Electronically Signed   By: Lonni Necessary M.D.   On: 06/05/2023 14:38   IR PERCUTANEOUS ART THROMBECTOMY/INFUSION INTRACRANIAL INC DIAG ANGIO Result Date: 06/05/2023 INDICATION: 83 year old male presenting with right-sided weakness and aphasia; NIHSS 28. His last known well was 10 p.m. on 06/04/2023. His past medical history significant for prior stroke, hypertension, diabetes and chronic kidney disease; baseline modified Rankin scale 0. Head CT showed hypodensity within the left insula, basal ganglia and left frontal operculum (ASPECTS 7). No IV thrombolytic given as patient was outside the window. CT angiogram of the head and neck showed an occlusion of a left M2/MCA anterior division branch. CT perfusion showed a 41 mL core infarct with a 45 mL ischemic penumbra. She was transferred to our service for mechanical thrombectomy. EXAM: ULTRASOUND-GUIDED VASCULAR ACCESS DIAGNOSTIC CEREBRAL ANGIOGRAM MECHANICAL THROMBECTOMY FLAT PANEL HEAD CT LEFT CAROTID STENTING AND ANGIOPLASTY WITHOUT CEREBRAL PROTECTION DEVICE COMPARISON:  CT/CT angiogram of the head and neck June 05, 2023. MEDICATIONS: No antibiotics administered. ANESTHESIA/SEDATION: The procedure was performed under general anesthesia. CONTRAST:  80 mL of Omnipaque  300 milligram/mL FLUOROSCOPY: Radiation Exposure Index (as provided by the fluoroscopic device): 1315 mGy Kerma COMPLICATIONS: None immediate. TECHNIQUE: Informed written consent was obtained from the patient's wife after a thorough discussion of the procedural risks, benefits and alternatives. All questions were addressed. Maximal Sterile Barrier Technique was utilized including caps, mask, sterile gowns, sterile gloves, sterile drape, hand hygiene and skin antiseptic. A timeout was performed prior to the initiation of the procedure. The right groin was prepped and draped in the usual sterile fashion. Using a micropuncture kit and  the modified Seldinger technique, access was gained to the right common femoral artery and an 8 French sheath was placed. Real-time ultrasound guidance was utilized for vascular access including the acquisition of a permanent ultrasound image documenting patency of the accessed vessel. Under fluoroscopy, an 8 French Walrus balloon guide catheter was navigated over a 6 French VTK catheter and a 0.035 Terumo Glidewire into the aortic arch. The catheter was placed into the left common carotid artery and then advanced into the left internal carotid artery. The diagnostic catheter was removed. Frontal and lateral angiograms of the head were obtained. FINDINGS: 1. Ultrasound showed heavily calcified right common femoral artery is maintained patency and caliber. 2. Proximal occlusion of a left M2/MCA anterior division branch. 3. Atherosclerotic changes of the intracranial left ICA with mild stenosis at the distal cavernous segment. 4. 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 PROCEDURE: Using biplane roadmap guidance, a Red 62 aspiration catheter was navigated over Colossus 35 microguidewire into the cavernous segment of the left ICA. The aspiration catheter was then advanced to the level of occlusion and connected to an aspiration pump. Continuous aspiration was performed for 2 minutes. The guide catheter was connected to a VacLok syringe and the guiding catheter balloon was inflated. The aspiration catheter was subsequently removed under constant aspiration. The guide catheter was aspirated for debris. Left internal carotid artery angiograms with frontal and lateral views of the head showed complete recanalization of the left MCA vascular tree. The guide catheter was retracted into the neck. Frontal and lateral angiograms of the neck were obtained. Improvement of the degree  of stenosis in the carotid bulb compared  to prior CT angiogram. There is prominent luminal irregularity at the carotid bulb residual moderate stenosis. Increased tortuosity of the proximal/mid cervical left ICA with kinking. Left internal carotid artery angiograms with left anterior oblique views of the neck showed evidence of filling defects at the level of the carotid bulb. Flat panel CT of the head was obtained and post processed in a separate workstation with concurrent attending physician supervision. Selected images were sent to PACS. No evidence of hemorrhagic complication. There is mild contrast staining of the left insular and frontal cortex. Repeat left internal carotid artery angiograms with frontal and lateral views of the head showed Amy seen left M3/MCA branch to the left parietal region. Left common carotid artery angiograms with frontal and lateral views of the neck showed vertebra Gretchen of stenosis at the left ICA bulb with more prominent filling defect. At this point, patient was loaded on cangrelor  followed by continuous drip. Using biplane roadmap guidance, a 4-7 mm Emboshield NAV6 cerebral protection device was advanced into the cervical left ICA. However, multiple attempts to advance a cerebral protection device through the left ICA kinking proved unsuccessful. The cerebral protection device was subsequently removed. Using biplane roadmap guidance, a 10-8 x 40 mm XACT carotid stent was navigated and deployed from the distal left common carotid artery to the proximal left internal carotid artery, proximal to the vessel kinking. Suboptimal stent expansion was noted. Then, a 6 x 30 mm Viatrac balloon was navigated into the recently deployed stent. Angioplasty was performed under fluoroscopy. Left internal carotid artery angiograms with frontal and lateral views of the neck showed adequate stent positioning and expansion with brisk anterograde flow. Left internal carotid artery angiograms with frontal and lateral views of the head  showed improvement of anterograde flow in the left MCA vascular tree with brisk anterograde flow. Delayed left common carotid artery angiograms with frontal and lateral views of the neck showed no evidence of clot formation within the stent. The catheter was subsequently Riddle. Right common femoral artery angiogram was obtained in right anterior oblique view. The puncture is at the level of the common femoral artery. The artery has normal caliber, adequate for closure device. The sheath was exchanged over the wire for an 8 French Angio-Seal which was utilized for access closure. Immediate hemostasis was achieved. IMPRESSION: 1. Successful mechanical thrombectomy for treatment of a proximal left M2/MCA anterior division branch occlusion achieving complete recanalization (TICI 3). 2. 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 PLAN: Continue cangrelor  infusion until patient is transitioned to oral dual antiplatelet therapy. Electronically Signed   By: Katyucia  de Macedo Rodrigues M.D.   On: 06/05/2023 13:16   IR US  Guide Vasc Access Right Result Date: 06/05/2023 INDICATION: 84 year old male presenting with right-sided weakness and aphasia; NIHSS 28. His last known well was 10 p.m. on 06/04/2023. His past medical history significant for prior stroke, hypertension, diabetes and chronic kidney disease; baseline modified Rankin scale 0. Head CT showed hypodensity within the left insula, basal ganglia and left frontal operculum (ASPECTS 7). No IV thrombolytic given as patient was outside the window. CT angiogram of the head and neck showed an occlusion of a left M2/MCA anterior division branch. CT perfusion showed a 41 mL core infarct with a 45 mL ischemic penumbra. She was transferred to our service for mechanical thrombectomy. EXAM: ULTRASOUND-GUIDED VASCULAR ACCESS  DIAGNOSTIC CEREBRAL ANGIOGRAM MECHANICAL THROMBECTOMY FLAT PANEL HEAD CT LEFT CAROTID STENTING AND ANGIOPLASTY WITHOUT CEREBRAL PROTECTION DEVICE COMPARISON:  CT/CT angiogram of the head and neck June 05, 2023. MEDICATIONS: No antibiotics administered. ANESTHESIA/SEDATION: The procedure was performed under general anesthesia. CONTRAST:  80 mL of Omnipaque  300 milligram/mL FLUOROSCOPY: Radiation Exposure Index (as provided by the fluoroscopic device): 1315 mGy Kerma COMPLICATIONS: None immediate. TECHNIQUE: Informed written consent was obtained from the patient's wife after a thorough discussion of the procedural risks, benefits and alternatives. All questions were addressed. Maximal Sterile Barrier Technique was utilized including caps, mask, sterile gowns, sterile gloves, sterile drape, hand hygiene and skin antiseptic. A timeout was performed prior to the initiation of the procedure. The right groin was prepped and draped in the usual sterile fashion. Using a micropuncture kit and the modified Seldinger technique, access was gained to the right common femoral artery and an 8 French sheath was placed. Real-time ultrasound guidance was utilized for vascular access including the acquisition of a permanent ultrasound image documenting patency of the accessed vessel. Under fluoroscopy, an 8 French Walrus balloon guide catheter was navigated over a 6 French VTK catheter and a 0.035 Terumo Glidewire into the aortic arch. The catheter was placed into the left common carotid artery and then advanced into the left internal carotid artery. The diagnostic catheter was removed. Frontal and lateral angiograms of the head were obtained. FINDINGS: 1. Ultrasound showed heavily calcified right common femoral artery is maintained patency and caliber. 2. Proximal occlusion of a left M2/MCA anterior division branch. 3. Atherosclerotic changes of the intracranial left ICA with mild stenosis at the distal cavernous segment. 4. A 2-3  mm laterally projecting saccular aneurysm of the cavernous segment of the left ICA (extradural), similar to prior MR angiogram performed 2021. PROCEDURE: Using biplane roadmap guidance, a Red 62 aspiration catheter was navigated over Colossus 35 microguidewire into the cavernous segment of the left ICA. The aspiration catheter was then advanced to the level of occlusion and connected to an aspiration pump. Continuous aspiration was performed for 2 minutes. The guide catheter was connected to a VacLok syringe and the guiding catheter balloon was inflated. The aspiration catheter was subsequently removed under constant aspiration. The guide catheter was aspirated for debris. Left internal carotid artery angiograms with frontal and lateral views of the head showed complete recanalization of the left MCA vascular tree. The guide catheter was retracted into the neck. Frontal and lateral angiograms of the neck were obtained. Improvement of the degree of stenosis in the carotid bulb compared to prior CT angiogram. There is prominent luminal irregularity at the carotid bulb residual moderate stenosis. Increased tortuosity of the proximal/mid cervical left ICA with kinking. Left internal carotid artery angiograms with left anterior oblique views of the neck showed evidence of filling defects at the level of the carotid bulb. Flat panel CT of the head was obtained and post processed in a separate workstation with concurrent attending physician supervision. Selected images were sent to PACS. No evidence of hemorrhagic complication. There is mild contrast staining of the left insular and frontal cortex. Repeat left internal carotid artery angiograms with frontal and lateral views of the head showed Amy seen left M3/MCA branch to the left parietal region. Left common carotid artery angiograms with frontal and lateral views of the neck showed vertebra Gretchen of stenosis at the left ICA bulb with more prominent filling defect.  At this point, patient was loaded on cangrelor  followed by continuous drip. Using biplane roadmap guidance,  a 4-7 mm Emboshield NAV6 cerebral protection device was advanced into the cervical left ICA. However, multiple attempts to advance a cerebral protection device through the left ICA kinking proved unsuccessful. The cerebral protection device was subsequently removed. Using biplane roadmap guidance, a 10-8 x 40 mm XACT carotid stent was navigated and deployed from the distal left common carotid artery to the proximal left internal carotid artery, proximal to the vessel kinking. Suboptimal stent expansion was noted. Then, a 6 x 30 mm Viatrac balloon was navigated into the recently deployed stent. Angioplasty was performed under fluoroscopy. Left internal carotid artery angiograms with frontal and lateral views of the neck showed adequate stent positioning and expansion with brisk anterograde flow. Left internal carotid artery angiograms with frontal and lateral views of the head showed improvement of anterograde flow in the left MCA vascular tree with brisk anterograde flow. Delayed left common carotid artery angiograms with frontal and lateral views of the neck showed no evidence of clot formation within the stent. The catheter was subsequently Riddle. Right common femoral artery angiogram was obtained in right anterior oblique view. The puncture is at the level of the common femoral artery. The artery has normal caliber, adequate for closure device. The sheath was exchanged over the wire for an 8 French Angio-Seal which was utilized for access closure. Immediate hemostasis was achieved. IMPRESSION: 1. Successful mechanical thrombectomy for treatment of a proximal left M2/MCA anterior division branch occlusion achieving complete recanalization (TICI 3). 2. Atherosclerotic disease of the left carotid bifurcation with stenosis and clot formation suggesting acute plaque rupture treated with stenting and angioplasty  with resolution of stenosis. PLAN: Continue cangrelor  infusion until patient is transitioned to oral dual antiplatelet therapy. Electronically Signed   By: Katyucia  de Macedo Rodrigues M.D.   On: 06/05/2023 13:16   IR INTRAVSC STENT CERV CAROTID W/O EMB-PROT MOD SED Result Date: 06/05/2023 INDICATION: 83 year old male presenting with right-sided weakness and aphasia; NIHSS 28. His last known well was 10 p.m. on 06/04/2023. His past medical history significant for prior stroke, hypertension, diabetes and chronic kidney disease; baseline modified Rankin scale 0. Head CT showed hypodensity within the left insula, basal ganglia and left frontal operculum (ASPECTS 7). No IV thrombolytic given as patient was outside the window. CT angiogram of the head and neck showed an occlusion of a left M2/MCA anterior division branch. CT perfusion showed a 41 mL core infarct with a 45 mL ischemic penumbra. She was transferred to our service for mechanical thrombectomy. EXAM: ULTRASOUND-GUIDED VASCULAR ACCESS DIAGNOSTIC CEREBRAL ANGIOGRAM MECHANICAL THROMBECTOMY FLAT PANEL HEAD CT LEFT CAROTID STENTING AND ANGIOPLASTY WITHOUT CEREBRAL PROTECTION DEVICE COMPARISON:  CT/CT angiogram of the head and neck June 05, 2023. MEDICATIONS: No antibiotics administered. ANESTHESIA/SEDATION: The procedure was performed under general anesthesia. CONTRAST:  80 mL of Omnipaque  300 milligram/mL FLUOROSCOPY: Radiation Exposure Index (as provided by the fluoroscopic device): 1315 mGy Kerma COMPLICATIONS: None immediate. TECHNIQUE: Informed written consent was obtained from the patient's wife after a thorough discussion of the procedural risks, benefits and alternatives. All questions were addressed. Maximal Sterile Barrier Technique was utilized including caps, mask, sterile gowns, sterile gloves, sterile drape, hand hygiene and skin antiseptic. A timeout was performed prior to the initiation of the procedure. The right groin was prepped and draped  in the usual sterile fashion. Using a micropuncture kit and the modified Seldinger technique, access was gained to the right common femoral artery and an 8 French sheath was placed. Real-time ultrasound guidance was utilized for vascular access including  the acquisition of a permanent ultrasound image documenting patency of the accessed vessel. Under fluoroscopy, an 8 French Walrus balloon guide catheter was navigated over a 6 French VTK catheter and a 0.035 Terumo Glidewire into the aortic arch. The catheter was placed into the left common carotid artery and then advanced into the left internal carotid artery. The diagnostic catheter was removed. Frontal and lateral angiograms of the head were obtained. FINDINGS: 1. Ultrasound showed heavily calcified right common femoral artery is maintained patency and caliber. 2. Proximal occlusion of a left M2/MCA anterior division branch. 3. Atherosclerotic changes of the intracranial left ICA with mild stenosis at the distal cavernous segment. 4. 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 PROCEDURE: Using biplane roadmap guidance, a Red 62 aspiration catheter was navigated over Colossus 35 microguidewire into the cavernous segment of the left ICA. The aspiration catheter was then advanced to the level of occlusion and connected to an aspiration pump. Continuous aspiration was performed for 2 minutes. The guide catheter was connected to a VacLok syringe and the guiding catheter balloon was inflated. The aspiration catheter was subsequently removed under constant aspiration. The guide catheter was aspirated for debris. Left internal carotid artery angiograms with frontal and lateral views of the head showed complete recanalization of the left MCA vascular tree. The guide catheter was retracted into the neck. Frontal and lateral angiograms of the neck were obtained.  Improvement of the degree of stenosis in the carotid bulb compared to prior CT angiogram. There is prominent luminal irregularity at the carotid bulb residual moderate stenosis. Increased tortuosity of the proximal/mid cervical left ICA with kinking. Left internal carotid artery angiograms with left anterior oblique views of the neck showed evidence of filling defects at the level of the carotid bulb. Flat panel CT of the head was obtained and post processed in a separate workstation with concurrent attending physician supervision. Selected images were sent to PACS. No evidence of hemorrhagic complication. There is mild contrast staining of the left insular and frontal cortex. Repeat left internal carotid artery angiograms with frontal and lateral views of the head showed Amy seen left M3/MCA branch to the left parietal region. Left common carotid artery angiograms with frontal and lateral views of the neck showed vertebra Gretchen of stenosis at the left ICA bulb with more prominent filling defect. At this point, patient was loaded on cangrelor  followed by continuous drip. Using biplane roadmap guidance, a 4-7 mm Emboshield NAV6 cerebral protection device was advanced into the cervical left ICA. However, multiple attempts to advance a cerebral protection device through the left ICA kinking proved unsuccessful. The cerebral protection device was subsequently removed. Using biplane roadmap guidance, a 10-8 x 40 mm XACT carotid stent was navigated and deployed from the distal left common carotid artery to the proximal left internal carotid artery, proximal to the vessel kinking. Suboptimal stent expansion was noted. Then, a 6 x 30 mm Viatrac balloon was navigated into the recently deployed stent. Angioplasty was performed under fluoroscopy. Left internal carotid artery angiograms with frontal and lateral views of the neck showed adequate stent positioning and expansion with brisk anterograde flow. Left internal  carotid artery angiograms with frontal and lateral views of the head showed improvement of anterograde flow in the left MCA vascular tree with brisk anterograde flow. Delayed left common carotid artery angiograms with frontal and lateral views of the neck showed no evidence of clot  formation within the stent. The catheter was subsequently Riddle. Right common femoral artery angiogram was obtained in right anterior oblique view. The puncture is at the level of the common femoral artery. The artery has normal caliber, adequate for closure device. The sheath was exchanged over the wire for an 8 French Angio-Seal which was utilized for access closure. Immediate hemostasis was achieved. IMPRESSION: 1. Successful mechanical thrombectomy for treatment of a proximal left M2/MCA anterior division branch occlusion achieving complete recanalization (TICI 3). 2. 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 PLAN: Continue cangrelor  infusion until patient is transitioned to oral dual antiplatelet therapy. Electronically Signed   By: Katyucia  de Macedo Rodrigues M.D.   On: 06/05/2023 13:16   IR CT Head Ltd Result Date: 06/05/2023 INDICATION: 83 year old male presenting with right-sided weakness and aphasia; NIHSS 28. His last known well was 10 p.m. on 06/04/2023. His past medical history significant for prior stroke, hypertension, diabetes and chronic kidney disease; baseline modified Rankin scale 0. Head CT showed hypodensity within the left insula, basal ganglia and left frontal operculum (ASPECTS 7). No IV thrombolytic given as patient was outside the window. CT angiogram of the head and neck showed an occlusion of a left M2/MCA anterior division branch. CT perfusion showed a 41 mL core infarct with a 45 mL ischemic penumbra. She was transferred to our service for mechanical  thrombectomy. EXAM: ULTRASOUND-GUIDED VASCULAR ACCESS DIAGNOSTIC CEREBRAL ANGIOGRAM MECHANICAL THROMBECTOMY FLAT PANEL HEAD CT LEFT CAROTID STENTING AND ANGIOPLASTY WITHOUT CEREBRAL PROTECTION DEVICE COMPARISON:  CT/CT angiogram of the head and neck June 05, 2023. MEDICATIONS: No antibiotics administered. ANESTHESIA/SEDATION: The procedure was performed under general anesthesia. CONTRAST:  80 mL of Omnipaque  300 milligram/mL FLUOROSCOPY: Radiation Exposure Index (as provided by the fluoroscopic device): 1315 mGy Kerma COMPLICATIONS: None immediate. TECHNIQUE: Informed written consent was obtained from the patient's wife after a thorough discussion of the procedural risks, benefits and alternatives. All questions were addressed. Maximal Sterile Barrier Technique was utilized including caps, mask, sterile gowns, sterile gloves, sterile drape, hand hygiene and skin antiseptic. A timeout was performed prior to the initiation of the procedure. The right groin was prepped and draped in the usual sterile fashion. Using a micropuncture kit and the modified Seldinger technique, access was gained to the right common femoral artery and an 8 French sheath was placed. Real-time ultrasound guidance was utilized for vascular access including the acquisition of a permanent ultrasound image documenting patency of the accessed vessel. Under fluoroscopy, an 8 French Walrus balloon guide catheter was navigated over a 6 French VTK catheter and a 0.035 Terumo Glidewire into the aortic arch. The catheter was placed into the left common carotid artery and then advanced into the left internal carotid artery. The diagnostic catheter was removed. Frontal and lateral angiograms of the head were obtained. FINDINGS: 1. Ultrasound showed heavily calcified right common femoral artery is maintained patency and caliber. 2. Proximal occlusion of a left M2/MCA anterior division branch. 3. Atherosclerotic changes of the intracranial left ICA with  mild stenosis at the distal cavernous segment. 4. 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 PROCEDURE: Using biplane roadmap guidance, a Red 62 aspiration catheter was navigated over Colossus 35 microguidewire into the cavernous segment of the left ICA. The aspiration catheter was then advanced to the level of occlusion and  connected to an aspiration pump. Continuous aspiration was performed for 2 minutes. The guide catheter was connected to a VacLok syringe and the guiding catheter balloon was inflated. The aspiration catheter was subsequently removed under constant aspiration. The guide catheter was aspirated for debris. Left internal carotid artery angiograms with frontal and lateral views of the head showed complete recanalization of the left MCA vascular tree. The guide catheter was retracted into the neck. Frontal and lateral angiograms of the neck were obtained. Improvement of the degree of stenosis in the carotid bulb compared to prior CT angiogram. There is prominent luminal irregularity at the carotid bulb residual moderate stenosis. Increased tortuosity of the proximal/mid cervical left ICA with kinking. Left internal carotid artery angiograms with left anterior oblique views of the neck showed evidence of filling defects at the level of the carotid bulb. Flat panel CT of the head was obtained and post processed in a separate workstation with concurrent attending physician supervision. Selected images were sent to PACS. No evidence of hemorrhagic complication. There is mild contrast staining of the left insular and frontal cortex. Repeat left internal carotid artery angiograms with frontal and lateral views of the head showed Amy seen left M3/MCA branch to the left parietal region. Left common carotid artery angiograms with frontal and lateral views of the neck showed vertebra Gretchen of stenosis at  the left ICA bulb with more prominent filling defect. At this point, patient was loaded on cangrelor  followed by continuous drip. Using biplane roadmap guidance, a 4-7 mm Emboshield NAV6 cerebral protection device was advanced into the cervical left ICA. However, multiple attempts to advance a cerebral protection device through the left ICA kinking proved unsuccessful. The cerebral protection device was subsequently removed. Using biplane roadmap guidance, a 10-8 x 40 mm XACT carotid stent was navigated and deployed from the distal left common carotid artery to the proximal left internal carotid artery, proximal to the vessel kinking. Suboptimal stent expansion was noted. Then, a 6 x 30 mm Viatrac balloon was navigated into the recently deployed stent. Angioplasty was performed under fluoroscopy. Left internal carotid artery angiograms with frontal and lateral views of the neck showed adequate stent positioning and expansion with brisk anterograde flow. Left internal carotid artery angiograms with frontal and lateral views of the head showed improvement of anterograde flow in the left MCA vascular tree with brisk anterograde flow. Delayed left common carotid artery angiograms with frontal and lateral views of the neck showed no evidence of clot formation within the stent. The catheter was subsequently Riddle. Right common femoral artery angiogram was obtained in right anterior oblique view. The puncture is at the level of the common femoral artery. The artery has normal caliber, adequate for closure device. The sheath was exchanged over the wire for an 8 French Angio-Seal which was utilized for access closure. Immediate hemostasis was achieved. IMPRESSION: 1. Successful mechanical thrombectomy for treatment of a proximal left M2/MCA anterior division branch occlusion achieving complete recanalization (TICI 3). 2. Atherosclerotic disease of the left carotid bifurcation with stenosis and clot formation suggesting  acute plaque rupture treated with stenting and angioplasty with resolution of stenosis. PLAN: Continue cangrelor  infusion until patient is transitioned to oral dual antiplatelet therapy. Electronically Signed   By: Katyucia  de Macedo Rodrigues M.D.   On: 06/05/2023 13:16   CT ANGIO HEAD NECK W WO CM W PERF (CODE STROKE) Addendum Date: 06/05/2023 ADDENDUM REPORT: 06/05/2023 12:25 ADDENDUM: Please note, there is a dictation error within CTA neck impression #1, which  should read: The common carotid and internal carotid arteries are patent within the neck. Atherosclerotic plaque bilaterally. Most notably, there is progressive atherosclerotic plaque about the left carotid bifurcation and within the proximal left ICA with resultant severe near occlusive stenosis of the proximal left ICA. Also of note, atherosclerotic plaque about the right carotid bifurcation results in a 40% stenosis at the right ICA origin. Electronically Signed   By: Rockey Childs D.O.   On: 06/05/2023 12:25   Result Date: 06/05/2023 CLINICAL DATA:  Provided history: Cerebrovascular accident, unspecified mechanism. Right-sided weakness. Right-sided facial droop. Altered mental status. EXAM: CT ANGIOGRAPHY HEAD AND NECK CT PERFUSION BRAIN TECHNIQUE: Multidetector CT imaging of the head and neck was performed using the standard protocol during bolus administration of intravenous contrast. Multiplanar CT image reconstructions and MIPs were obtained to evaluate the vascular anatomy. Carotid stenosis measurements (when applicable) are obtained utilizing NASCET criteria, using the distal internal carotid diameter as the denominator. Multiphase CT imaging of the brain was performed following IV bolus contrast injection. Subsequent parametric perfusion maps were calculated using RAPID software. RADIATION DOSE REDUCTION: This exam was performed according to the departmental dose-optimization program which includes automated exposure control, adjustment of  the mA and/or kV according to patient size and/or use of iterative reconstruction technique. CONTRAST:  OMNIPAQUE  IOHEXOL  350 MG/ML SOLN COMPARISON:  Noncontrast head CT performed earlier today 06/05/2023. MRA head and MRA neck 10/04/2019. FINDINGS: CTA NECK FINDINGS Aortic arch: Common origin of the innominate and left common carotid arteries. Atherosclerotic plaque within the visualized thoracic aorta and proximal major branch vessels of the neck. Streak/beam hardening artifact arising from a dense right-sided contrast bolus partially obscures the right subclavian artery. Within this limitation, there is no appreciable hemodynamically significant innominate or proximal subclavian artery stenosis. Right carotid system: CCA and ICA patent within the neck. Atherosclerotic plaque, greatest about the carotid bifurcation. Resultant 40% stenosis at the ICA origin. Left carotid system: CCA and ICA patent within the neck. Atherosclerotic plaque. Most notably, there is prominent atherosclerotic plaque about the carotid bifurcation and within the proximal ICA which has progressed from the prior MRA neck of 10/04/2019. Resultant severe (near occlusive) stenosis of the proximal ICA. Tortuosity of the cervical ICA Vertebral arteries: The vertebral arteries are patent within the neck. Streak/beam hardening artifact limits evaluation of the right vertebral artery origin. At least moderate stenosis is suspected at this site. Atherosclerotic plaque scattered elsewhere within the cervical right vertebral artery with no more than mild stenosis. Calcified atherosclerotic plaque at the left vertebral artery origin with suspected at least moderate stenosis. Nonstenotic calcified plaque elsewhere within the cervical left vertebral artery. Skeleton: Cervical spondylosis. Other neck: No neck mass or cervical lymphadenopathy. Upper chest: No consolidation within the imaged lung apices. Review of the MIP images confirms the above  findings CTA HEAD FINDINGS Anterior circulation: The intracranial internal carotid arteries are patent. As sclerotic plaque within both vessels. No more than mild stenosis on the right. Up to moderate stenosis within the left cavernous segment. The M1 middle cerebral arteries are patent. Abrupt occlusion of a proximal M2 left middle cerebral artery vessel (series 11, image 22). Atherosclerotic irregularity of the M2 and more distal MCA vessels elsewhere. The anterior cerebral arteries are patent. Atherosclerotic irregularity of both vessels without high-grade proximal stenosis. A possible 2 mm periophthalmic left ICA aneurysm with better appreciated on the prior MRA head of 10/04/2019. Posterior circulation: The intracranial vertebral arteries are patent. Atherosclerotic plaque within the right V4 segment sites  of mild stenosis. Non-stenotic atherosclerotic plaque within the left V4 segment. The basilar artery is patent. The posterior cerebral arteries are patent. Posterior communicating arteries are diminutive or absent, bilaterally. Venous sinuses: Assessment for dural venous sinus thrombosis is limited due to contrast timing. Anatomic variants: As described. Review of the MIP images confirms the above findings CT Brain Perfusion Findings: ASPECTS: CBF (<30%) Volume: 41mL Perfusion (Tmax>6.0s) volume: 86mL Mismatch Volume: 45mL Infarction Location:Left MCA vascular territory CTA head impression #1, the CT perfusion head impression and the presence of a severe stenosis of the proximal cervical left ICA called by telephone at the time of interpretation on 06/05/2023 at 8:40 am to provider [facility] , who verbally acknowledged these results. IMPRESSION: CTA neck: 1. No common carotid and internal carotid arteries are patent within the neck. Atherosclerotic plaque bilaterally. Most notably, there is progressive atherosclerotic plaque about the left carotid bifurcation and within the proximal left ICA with resultant  severe, near occlusive stenosis of the proximal left ICA. Also of note, atherosclerotic plaque about the right carotid bifurcation results in 40% stenosis at the right ICA origin. 2. The vertebral arteries are patent within the neck. Atherosclerotic plaque bilaterally as described. Most notably, there is suspected at least moderate stenoses at the bilateral vertebral artery origins. 3. Aortic Atherosclerosis (ICD10-I70.0). CTA head: 1. Abrupt occlusion of a proximal M2 left middle cerebral artery vessel. 2. Background intracranial atherosclerotic disease as described. 3. A possible 2 mm periophthalmic left ICA aneurysm was better appreciated on the prior MRA head of 10/04/2019. CT perfusion head: The perfusion software identifies a 41 mL core infarct in the left MCA vascular territory. The perfusion software identifies an 86 mL region of critically hypoperfused parenchyma within the left MCA vascular territory (utilizing the Tmax>6 seconds threshold). Reported mismatch volume: 45 mL Electronically Signed: By: Rockey Childs D.O. On: 06/05/2023 09:07   CT HEAD CODE STROKE WO CONTRAST Result Date: 06/05/2023 CLINICAL DATA:  Code stroke. Neuro deficit, acute, stroke suspected. EXAM: CT HEAD WITHOUT CONTRAST TECHNIQUE: Contiguous axial images were obtained from the base of the skull through the vertex without intravenous contrast. RADIATION DOSE REDUCTION: This exam was performed according to the departmental dose-optimization program which includes automated exposure control, adjustment of the mA and/or kV according to patient size and/or use of iterative reconstruction technique. COMPARISON:  Brain MRI 10/04/2019.  Noncontrast head CT 10/03/2019. FINDINGS: Brain: Generalized cerebral atrophy. Loss of gray-white differentiation consistent with an acute infarct within the left insula and within portions of the left frontal operculum (MCA vascular territory). Known small chronic cortically-based infarcts within the left  frontal, left parietal and left occipital lobes were better appreciated on the prior brain MRI of 10/04/2019 (acute at that time). Mild patchy and ill-defined hypoattenuation within the cerebral white matter, nonspecific but compatible with chronic small vessel ischemic disease. Subcentimeter infarct within the superior right cerebellar hemisphere, new from the prior MRI but chronic in appearance. Loss of gray-white differentiation there is no acute intracranial hemorrhage. No extra-axial fluid collection. No evidence of an intracranial mass. No midline shift. Vascular: No hyperdense vessel.  Atherosclerotic calcifications. Skull: No calvarial fracture or aggressive osseous lesion. Sinuses/Orbits: No mass or acute finding within the imaged orbits. No significant paranasal sinus disease. ASPECTS [facility] Stroke Program Early CT Score) - Ganglionic level infarction (caudate, lentiform nuclei, internal capsule, insula, M1-M3 cortex): 5 - Supraganglionic infarction (M4-M6 cortex): 2 Total score (0-10 with 10 being normal): 7 Impression #1 called by telephone at the time of interpretation  on 06/05/2023 at 8:40 am to provider Dr. Lindzen, who verbally acknowledged these results. IMPRESSION: 1. Acute left MCA territory infarct affecting the left insula and portions of the left frontal operculum. ASPECTS is 7. 2. Known small chronic cortically-based infarcts within the left frontal, left parietal and left occipital lobes were better appreciated on the prior brain MRI of 10/04/2019 (acute at that time). 3. Background mild cerebral white matter chronic small vessel ischemic disease. 4. Subcentimeter infarct within the right cerebellar hemisphere, new from prior MRI but chronic in appearance. 5. Generalized cerebral atrophy. Electronically Signed   By: Rockey Childs D.O.   On: 06/05/2023 08:45     PHYSICAL EXAM  Temp:  [98 F (36.7 C)-101 F (38.3 C)] 99.7 F (37.6 C) (02/07 0800) Pulse Rate:  [61-88] 88 (02/07  0900) Resp:  [17-26] 19 (02/07 0900) BP: (96-164)/(54-124) 119/57 (02/07 0900) SpO2:  [95 %-100 %] 98 % (02/07 0900)  General - obese, well developed, in no apparent distress.  Ophthalmologic - fundi not visualized due to noncooperation.  Cardiovascular - Regular rhythm and rate.  Neuro - awake, alert, eyes open, orientated to age, place, but not to time but perseverated on age.   Moderate to severe dysarthria, paucity of speech, following all simple commands.  Able to name with perseveration and repeat simple sentence but not complicated sentence. No gaze palsy, tracking bilaterally, blinking to visual threat bilaterally.  Right facial droop. Tongue midline Bilateral UEs 4/5, minimal right drift.  Bilaterally LEs 3/5.  Sensation symmetrical bilaterally subjectively, b/l FTN intact grossly, gait not tested.    ASSESSMENT/PLAN [name]. is a 83 y.o. male with history of hypertension, diabetes, CKD 3, stroke admitted for right-sided weakness numbness, aphasia, right facial droop, left gaze preference. No TNK given due to outside window.    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 status post IR with TICI3 and confluent HT, likely secondary to large vessel disease source versus cardiomyopathy with low EF CT left MCA infarct CT head and neck left M2 occlusion, left ICA near occlusion, right ICA 43 stenosis, bilateral VA origin severe stenosis CTP 41/86 Status post IR with TICI3 and left ICA stenting MRI left MCA infarct at left insular and left frontal operculum, prominent and confluent petechial hemorrhagic transformation CT repeat 2/7 stable confluent petechial hemorrhage 2D Echo EF 30% LDL 107 HgbA1c 6.1 UDS negative Heparin  subcu for VTE prophylaxis aspirin  81 mg daily and clopidogrel  75 mg daily prior to admission, now on aspirin  and Plavix  post stenting. Patient will be counseled to be compliant with his antithrombotic  medications Ongoing aggressive stroke risk factor management Therapy recommendations:  CIR vs SNF Disposition: Pending  History of stroke 10/08/2019 admitted for left MCA infarct due to right upper extremity weakness.  MRA head and neck showed left ICA 30% stenosis.  EF 45 to 50%.  LDL 105, A1c 5.6.  Recommended loop recorder at that time but only got 30-day CardioNet monitoring which was no A-fib.  Discharged on DAPT and Lipitor  80.  Cardiomyopathy CHF 09/2019 EF 45 to 50% Current admission EF 30% Cardiology on board, appreciate Dr. Mona assistance On DAPT Agree with GDMT - would like to start losartan , entresto, and spironolactone  as BP and Cr tolerates Metoprolol  succinate 25mg  daily Lasix  80mg  x1 May consider loop recorder placement to rule out A-fib  Diabetes HgbA1c 6.1 goal < 7.0 Controlled CBG monitoring SSI DM education and close PCP follow up  Hypertension  Stable BP goal less than 160 given hemorrhagic transformation Long term BP goal normotensive  Hyperlipidemia Home meds: Lipitor  80 LDL 105, goal < 70 Now on lipitor  80 and Zetia  Continue statin and at discharge  Dysphagia Poststroke dysphagia Speech on board Dysphagia 1 (Pureed) honey thick liquid On core track With tube feeding at 65  Other Stroke Risk Factors Advanced age Obesity, Body mass index is 41.94 kg/m.   Other Active Problems AKI on CKD3b, creatinine 1.60--1.66 -> 1.98  Hospital day # 2  Patient seen and examined by NP/APP with MD. MD to update note as needed.   Jorene Last, DNP, FNP-BC [facility] Pager: [phone]  ATTENDING NOTE: I reviewed above note and agree with the assessment and plan. Pt was seen and examined.   Wife at bedside.  Patient lying in bed, more awake alert than yesterday, however still has perseveration, orientated to place and people but not to age or time.  Severe dysarthria, but following simple commands.  No gaze palsy, tracking bilaterally,  visual field full but with right simultagnosia, right facial droop, bilateral upper extremity 3+/5, slight right pronator drift.  Bilateral lower extremities 3/5.  Sensation inconsistent, bilateral finger-nose slow but no obvious ataxia.  Gait not tested.  Patient still has dysphagia, on tube feeding but now pured diet and honey thick liquid.  Cardiology Dr. Mona on board, has positive fluid status, given Lasix  80 milligram.  Also has AKI on CKD, need close monitoring, will gradually add on GDMT.  Continue DAPT and statin.  PT and OT recommend CIR, however currently placement likely SNF per daughter.  For detailed assessment and plan, please refer to above/below as I have made changes wherever appropriate.   Ary Cummins, MD PhD Stroke Neurology 06/07/2023 9:09 PM  This patient is critically ill due to left MCA and ICA occlusion status post IR and ICA stenting, cardiomyopathy and at significant risk of neurological worsening, death form recurrent stroke, hemorrhagic transformation, heart failure, aspiration pneumonia. This patient's care requires constant monitoring of vital signs, hemodynamics, respiratory and cardiac monitoring, review of multiple databases, neurological assessment, discussion with family, other specialists and medical decision making of high complexity. I spent 40 minutes of neurocritical care time in the care of this patient. I had long discussion with wife at bedside and daughter over the phone, updated pt current condition, treatment plan and potential prognosis, and answered all the questions.  They expressed understanding and appreciation.  I also discussed with Dr. Mona cardiology.  To contact Stroke Continuity provider, please refer to [email or web address]. After hours, contact General Neurology

## 2023-06-07 NOTE — Progress Notes (Addendum)
 DAILY PROGRESS NOTE   Patient Name: Nathan Proctor. Date of Encounter: 06/07/2023 Cardiologist: None  Chief Complaint   Somnolent, no complaints  Patient Profile   Nathan Proctor. is a 83 y.o. male with a hx of DM, CKD, HTN, HLD, history of CVA, chronic systolic heart failure (not routinely followed by cardiology)and morbid obesity who is being seen 06/06/2023 for the evaluation of reduced EF at the request of Neurology.   Subjective   Shortness of breath and wheezing overnight-  creatinine worse today at 1.98 (from 1.66).  LVEF on echo was 30% - he is recorded 500 positive- now 2.2L positive since admission.  Objective   Vitals:   06/07/23 0300 06/07/23 0700 06/07/23 0800 06/07/23 0900  BP: (!) 164/74 (!) 148/124 136/70 (!) 119/57  Pulse: 86 84 69 88  Resp: 20 (!) 21 20 19   Temp: (!) 101 F (38.3 C)  99.7 F (37.6 C)   TempSrc: Axillary  Axillary   SpO2: 97% 95% 99% 98%  Weight:        Intake/Output Summary (Last 24 hours) at 06/07/2023 1045 Last data filed at 06/06/2023 2200 Gross per 24 hour  Intake 680 ml  Output 300 ml  Net 380 ml   Filed Weights   06/05/23 0800  Weight: 132.6 kg    Physical Exam   General appearance: somnlent, but arouses to voice Neck: no carotid bruit, thyroid  not enlarged, symmetric, no tenderness/mass/nodules, and JVP difficult to assess Lungs: diminished breath sounds bibasilar and rhonchi bilaterally Heart: regular rate and rhythm Abdomen: soft, non-tender; bowel sounds normal; no masses,  no organomegaly and obese Extremities: edema 1-2+ bilateral, chronic stasis appearing Pulses: 2+ and symmetric Skin: Skin color, texture, turgor normal. No rashes or lesions Neurologic: Mental status: somnolent, arouses to voice, moving right side better Psych: Cannot assess  Inpatient Medications    Scheduled Meds:  aspirin   81 mg Per Tube Daily   atorvastatin   80 mg Per Tube Daily   Chlorhexidine  Gluconate Cloth  6 each  Topical Daily   clopidogrel   75 mg Per Tube Daily   ezetimibe   10 mg Per Tube Daily   feeding supplement (PROSource TF20)  60 mL Per Tube Daily   heparin  injection (subcutaneous)  5,000 Units Subcutaneous Q8H   insulin  aspart  2-6 Units Subcutaneous Q4H   metoprolol  tartrate  12.5 mg Per Tube BID   sodium chloride  flush  3 mL Intravenous Once    Continuous Infusions:  feeding supplement (OSMOLITE 1.5 CAL) 1,000 mL (06/07/23 0513)    PRN Meds: acetaminophen  **OR** acetaminophen  (TYLENOL ) oral liquid 160 mg/5 mL **OR** acetaminophen , hydrALAZINE , ipratropium-albuterol , labetalol , senna-docusate   Labs   Results for orders placed or performed during the hospital encounter of 06/05/23 (from the past 48 hours)  Hemoglobin A1c     Status: Abnormal   Collection Time: 06/05/23  3:00 PM  Result Value Ref Range   Hgb A1c MFr Bld 6.1 (H) 4.8 - 5.6 %    Comment: (NOTE) Pre diabetes:          5.7%-6.4%  Diabetes:              >6.4%  Glycemic control for   <7.0% adults with diabetes    Mean Plasma Glucose 128.37 mg/dL    Comment: Performed at [facility], [address] [place], [place] [address]  Magnesium      Status: None   Collection Time: 06/05/23  3:00 PM  Result  Value Ref Range   Magnesium  1.8 1.7 - 2.4 mg/dL    Comment: Performed at [facility], [address] [place], [place] [address]  Phosphorus     Status: None   Collection Time: 06/05/23  3:00 PM  Result Value Ref Range   Phosphorus 2.5 2.5 - 4.6 mg/dL    Comment: Performed at [facility], [address], [place], [place] [address]  Glucose, capillary     Status: Abnormal   Collection Time: 06/05/23  4:01 PM  Result Value Ref Range   Glucose-Capillary 160 (H) 70 - 99 mg/dL    Comment: Glucose reference range applies only to samples taken after fasting for at least 8 hours.  Urine rapid drug screen (hosp performed)not at [facility]     Status: None   Collection Time: 06/05/23  4:53 PM  Result Value Ref  Range   Opiates NONE DETECTED NONE DETECTED   Cocaine NONE DETECTED NONE DETECTED   Benzodiazepines NONE DETECTED NONE DETECTED   Amphetamines NONE DETECTED NONE DETECTED   Tetrahydrocannabinol NONE DETECTED NONE DETECTED   Barbiturates NONE DETECTED NONE DETECTED    Comment: (NOTE) DRUG SCREEN FOR MEDICAL PURPOSES ONLY.  IF CONFIRMATION IS NEEDED FOR ANY PURPOSE, NOTIFY LAB WITHIN 5 DAYS.  LOWEST DETECTABLE LIMITS FOR URINE DRUG SCREEN Drug Class                     Cutoff (ng/mL) Amphetamine and metabolites    1000 Barbiturate and metabolites    200 Benzodiazepine                 200 Opiates and metabolites        300 Cocaine and metabolites        300 THC                            50 Performed at [facility], [address], [place], [place] [address]   Urinalysis, Complete w Microscopic -Urine, Clean Catch     Status: Abnormal   Collection Time: 06/05/23  4:53 PM  Result Value Ref Range   Color, Urine YELLOW YELLOW   APPearance CLEAR CLEAR   Specific Gravity, Urine >1.046 (H) 1.005 - 1.030   pH 5.0 5.0 - 8.0   Glucose, UA NEGATIVE NEGATIVE mg/dL   Hgb urine dipstick NEGATIVE NEGATIVE   Bilirubin Urine NEGATIVE NEGATIVE   Ketones, ur 5 (A) NEGATIVE mg/dL   Protein, ur NEGATIVE NEGATIVE mg/dL   Nitrite NEGATIVE NEGATIVE   Leukocytes,Ua NEGATIVE NEGATIVE   RBC / HPF 0-5 0 - 5 RBC/hpf   WBC, UA 0-5 0 - 5 WBC/hpf   Bacteria, UA NONE SEEN NONE SEEN   Squamous Epithelial / HPF 0-5 0 - 5 /HPF   Mucus PRESENT     Comment: Performed at [facility], [address], [place], [place] [address]  Magnesium      Status: None   Collection Time: 06/05/23  6:11 PM  Result Value Ref Range   Magnesium  1.9 1.7 - 2.4 mg/dL    Comment: Performed at [facility], [address] [place], [place] [address]  Phosphorus     Status: Abnormal   Collection Time: 06/05/23  6:11 PM  Result Value Ref Range   Phosphorus 1.9 (L) 2.5 - 4.6 mg/dL    Comment: Performed at [facility], [address] [place],  [place] [address]  Glucose, capillary     Status: Abnormal   Collection Time: 06/05/23  7:49 PM  Result Value Ref Range   Glucose-Capillary 167 (H) 70 - 99 mg/dL    Comment: Glucose reference range applies only to samples taken after fasting for at least 8 hours.  Glucose, capillary     Status: Abnormal   Collection Time: 06/05/23 11:53 PM  Result Value Ref Range   Glucose-Capillary 132 (H) 70 - 99 mg/dL    Comment: Glucose reference range applies only to samples taken after fasting for at least 8 hours.  Glucose, capillary     Status: Abnormal   Collection Time: 06/06/23  3:44 AM  Result Value Ref Range   Glucose-Capillary 144 (H) 70 - 99 mg/dL    Comment: Glucose reference range applies only to samples taken after fasting for at least 8 hours.  Lipid panel     Status: Abnormal   Collection Time: 06/06/23  5:57 AM  Result Value Ref Range   Cholesterol 183 0 - 200 mg/dL   Triglycerides 81 <849 mg/dL   HDL 60 >59 mg/dL   Total CHOL/HDL Ratio 3.1 RATIO   VLDL 16 0 - 40 mg/dL   LDL Cholesterol 892 (H) 0 - 99 mg/dL    Comment:        Total Cholesterol/HDL:CHD Risk Coronary Heart Disease Risk Table                     Men   Women  1/2 Average Risk   3.4   3.3  Average Risk       5.0   4.4  2 X Average Risk   9.6   7.1  3 X Average Risk  23.4   11.0        Use the calculated Patient Ratio above and the CHD Risk Table to determine the patient's CHD Risk.        ATP III CLASSIFICATION (LDL):  <100     mg/dL   Optimal  899-870  mg/dL   Near or Above                    Optimal  130-159  mg/dL   Borderline  839-810  mg/dL   High  >809     mg/dL   Very High Performed at [facility], [address], [place], [place] [address]   Comprehensive metabolic panel     Status: Abnormal   Collection Time: 06/06/23  5:57 AM  Result Value Ref Range   Sodium 139 135 - 145 mmol/L   Potassium 5.0 3.5 - 5.1 mmol/L    Comment: HEMOLYSIS AT THIS  LEVEL MAY AFFECT RESULT DELTA CHECK NOTED    Chloride 110 98 - 111 mmol/L   CO2 16 (L) 22 - 32 mmol/L   Glucose, Bld 161 (H) 70 - 99 mg/dL    Comment: Glucose reference range applies only to samples taken after fasting for at least 8 hours.   BUN 23 8 - 23 mg/dL   Creatinine, Ser 8.33 (H) 0.61 - 1.24 mg/dL   Calcium  8.7 (L) 8.9 - 10.3 mg/dL   Total Protein 6.1 (L) 6.5 - 8.1 g/dL   Albumin  3.3 (L) 3.5 - 5.0 g/dL   AST 26 15 - 41 U/L    Comment: HEMOLYSIS AT THIS LEVEL MAY AFFECT RESULT   ALT 12 0 - 44 U/L    Comment: HEMOLYSIS AT THIS LEVEL  MAY AFFECT RESULT   Alkaline Phosphatase 72 38 - 126 U/L   Total Bilirubin 1.1 0.0 - 1.2 mg/dL    Comment: HEMOLYSIS AT THIS LEVEL MAY AFFECT RESULT   GFR, Estimated 41 (L) >60 mL/min    Comment: (NOTE) Calculated using the CKD-EPI Creatinine Equation (2021)    Anion gap 13 5 - 15    Comment: Performed at [facility], [address], [place], [place] [address]  CBC     Status: Abnormal   Collection Time: 06/06/23  5:57 AM  Result Value Ref Range   WBC 10.0 4.0 - 10.5 K/uL   RBC 3.72 (L) 4.22 - 5.81 MIL/uL   Hemoglobin 11.4 (L) 13.0 - 17.0 g/dL   HCT 63.6 (L) 60.9 - 47.9 %   MCV 97.6 80.0 - 100.0 fL   MCH 30.6 26.0 - 34.0 pg   MCHC 31.4 30.0 - 36.0 g/dL   RDW 86.1 88.4 - 84.4 %   Platelets 177 150 - 400 K/uL   nRBC 0.0 0.0 - 0.2 %    Comment: Performed at [facility], [address], [place], [place] [address]  Protime-INR     Status: Abnormal   Collection Time: 06/06/23  5:57 AM  Result Value Ref Range   Prothrombin Time 16.2 (H) 11.4 - 15.2 seconds   INR 1.3 (H) 0.8 - 1.2    Comment: (NOTE) INR goal varies based on device and disease states. Performed at [facility], [address], [place], [place] [address]   Magnesium      Status: None   Collection Time: 06/06/23  5:57 AM  Result Value Ref Range   Magnesium  2.3 1.7 - 2.4 mg/dL    Comment: Performed at [facility], [address] [place],  [place] [address]  Phosphorus     Status: None   Collection Time: 06/06/23  5:57 AM  Result Value Ref Range   Phosphorus 3.0 2.5 - 4.6 mg/dL    Comment: Performed at [facility], [address] [place], [place] [address]  Glucose, capillary     Status: Abnormal   Collection Time: 06/06/23  7:24 AM  Result Value Ref Range   Glucose-Capillary 165 (H) 70 - 99 mg/dL    Comment: Glucose reference range applies only to samples taken after fasting for at least 8 hours.  Glucose, capillary     Status: None   Collection Time: 06/06/23 11:00 AM  Result Value Ref Range   Glucose-Capillary 95 70 - 99 mg/dL    Comment: Glucose reference range applies only to samples taken after fasting for at least 8 hours.  Glucose, capillary     Status: Abnormal   Collection Time: 06/06/23  3:17 PM  Result Value Ref Range   Glucose-Capillary 139 (H) 70 - 99 mg/dL    Comment: Glucose reference range applies only to samples taken after fasting for at least 8 hours.  Magnesium      Status: None   Collection Time: 06/06/23  4:48 PM  Result Value Ref Range   Magnesium  2.2 1.7 - 2.4 mg/dL    Comment: Performed at [facility], [address] [place], [place] [address]  Phosphorus     Status: None   Collection Time: 06/06/23  4:48 PM  Result Value Ref Range   Phosphorus 3.4 2.5 - 4.6 mg/dL    Comment: Performed at [facility], [address] [place], [place] [address]  Glucose, capillary     Status: Abnormal   Collection Time: 06/06/23  8:06 PM  Result Value Ref Range   Glucose-Capillary 154 (H) 70 - 99 mg/dL    Comment: Glucose reference range applies only to samples taken after fasting for at least 8 hours.  Glucose, capillary     Status: Abnormal   Collection Time: 06/06/23 11:52 PM  Result Value Ref Range   Glucose-Capillary 137 (H) 70 - 99 mg/dL    Comment: Glucose reference range applies only to samples taken after fasting for at least 8 hours.  Glucose, capillary     Status: Abnormal    Collection Time: 06/07/23  3:53 AM  Result Value Ref Range   Glucose-Capillary 158 (H) 70 - 99 mg/dL    Comment: Glucose reference range applies only to samples taken after fasting for at least 8 hours.  Basic metabolic panel     Status: Abnormal   Collection Time: 06/07/23  4:13 AM  Result Value Ref Range   Sodium 143 135 - 145 mmol/L   Potassium 4.2 3.5 - 5.1 mmol/L   Chloride 110 98 - 111 mmol/L   CO2 20 (L) 22 - 32 mmol/L   Glucose, Bld 163 (H) 70 - 99 mg/dL    Comment: Glucose reference range applies only to samples taken after fasting for at least 8 hours.   BUN 31 (H) 8 - 23 mg/dL   Creatinine, Ser 8.01 (H) 0.61 - 1.24 mg/dL   Calcium  9.5 8.9 - 10.3 mg/dL   GFR, Estimated 33 (L) >60 mL/min    Comment: (NOTE) Calculated using the CKD-EPI Creatinine Equation (2021)    Anion gap 13 5 - 15    Comment: Performed at [facility], [address], [place], [place] [address]  CBC     Status: Abnormal   Collection Time: 06/07/23  4:13 AM  Result Value Ref Range   WBC 8.4 4.0 - 10.5 K/uL   RBC 3.81 (L) 4.22 - 5.81 MIL/uL   Hemoglobin 11.8 (L) 13.0 - 17.0 g/dL   HCT 62.8 (L) 60.9 - 47.9 %   MCV 97.4 80.0 - 100.0 fL   MCH 31.0 26.0 - 34.0 pg   MCHC 31.8 30.0 - 36.0 g/dL   RDW 86.0 88.4 - 84.4 %   Platelets 151 150 - 400 K/uL   nRBC 0.0 0.0 - 0.2 %    Comment: Performed at [facility], [address], [place], [place] [address]  Glucose, capillary     Status: Abnormal   Collection Time: 06/07/23  7:43 AM  Result Value Ref Range   Glucose-Capillary 186 (H) 70 - 99 mg/dL    Comment: Glucose reference range applies only to samples taken after fasting for at least 8 hours.    ECG   Sinus rhythm with PVC's at 85 - Personally Reviewed  Telemetry   Sinus rhythm - Personally Reviewed  Radiology    CT HEAD WO CONTRAST ( ) Result Date: 06/07/2023 CLINICAL DATA:  83 year old male status post code stroke presentation, left MCA M2 occlusion, with left MCA infarct with  confluent petechial hemorrhage. EXAM: CT HEAD WITHOUT CONTRAST TECHNIQUE: Contiguous axial images were obtained from the base of the skull through the vertex without intravenous contrast. RADIATION DOSE REDUCTION: This exam was performed according to the departmental dose-optimization program which includes automated exposure control, adjustment of the mA and/or kV according to patient size and/or use of iterative reconstruction technique. COMPARISON:  Brain MRI yesterday.  Head CT 06/05/2023. FINDINGS: Brain: Confluent mixed density infarct in the left MCA middle division, epicenter at the insula and operculum. Confluent petechial hemorrhage on series 3, image 16 appears stable from the MRI. Superimposed trace subarachnoid blood is difficult to exclude by CT (series 3, image 13), but was not apparent on MRI. Stable mild mass effect on the left lateral ventricle with only trace rightward midline shift (series 3, image 16). No ventriculomegaly. Elsewhere gray-white differentiation is stable from the presentation CT. Basilar cisterns remain patent. Vascular: Resolved hyperdense left MCA since presentation. Calcified atherosclerosis at the skull base. Skull: Stable, intact. Sinuses/Orbits: Visualized paranasal sinuses and mastoids are stable and well aerated. Other: Left nasoenteric tube now in place. Stable orbit and scalp soft tissues. IMPRESSION: 1. Stable by CT confluent Left MCA middle division infarct with petechial hemorrhage (Heidelberg classification 1b: HI2, confluent petechiae, no mass effect). Questionable trace superimposed SAH, but was not apparent on MRI. 2. Stable minimal intracranial mass effect. 3. No new intracranial abnormality. Electronically Signed   By: VEAR Hurst M.D.   On: 06/07/2023 05:37   MR BRAIN WO CONTRAST Result Date: 06/06/2023 CLINICAL DATA:  Follow-up examination for stroke. EXAM: MRI HEAD WITHOUT CONTRAST TECHNIQUE: Multiplanar, multiecho pulse sequences of the brain and surrounding  structures were obtained without intravenous contrast. COMPARISON:  Comparison made to multiple previous exams from 06/05/2023. FINDINGS: Brain: Examination degraded by motion artifact. Cerebral volume within normal limits for age. Patchy T2/FLAIR hyperintensity involving the periventricular deep white matter both cerebral hemispheres, consistent with chronic small vessel ischemic disease, mild in nature. Small remote infarct noted within the right cerebellum. Confluent restricted diffusion involving the left insula and left frontal operculum, consistent with evolving acute left MCA distribution infarct. Area of infarction measures up to approximately 6 cm in AP diameter. Prominent associated susceptibility artifact, consistent with petechial hemorrhage (series 7, image 61) ( Heidelberg classification 1b: HI2, confluent petechiae, no mass effect. No frank or organized hematoma evident by MRI. No significant regional mass effect. Few additional small foci of infarction noted within the left parieto-occipital region posteriorly (series 3, images 40, 29). Apparent diffusion signal along the right frontal parafalcine region felt to be consistent with artifact related to dural calcification. Note made of a few additional chronic micro hemorrhages within the left cerebellum and right thalamus. No mass lesion or midline shift. Ventricles normal size without hydrocephalus. No extra-axial fluid collection. Pituitary gland suprasellar region within normal limits. Vascular: Major intracranial vascular flow voids are maintained. Skull and upper cervical spine: Craniocervical junction within normal limits. Bone marrow signal intensity overall within normal limits. No scalp soft tissue abnormality. Sinuses/Orbits: Prior bilateral ocular lens replacement. Paranasal sinuses are clear. No mastoid effusion. Other: None. IMPRESSION: 1. Evolving acute left MCA distribution infarct, most pronounced at the left insula and left frontal  operculum. Prominent associated petechial hemorrhage without frank intraparenchymal hematoma (Heidelberg classification 1b: HI2, confluent petechiae, no mass effect). 2. Few additional small foci of acute infarction within the left parieto-occipital region posteriorly. 3. Underlying mild chronic microvascular ischemic disease with small remote right cerebellar infarct. Electronically Signed   By: Morene Hoard M.D.   On: 06/06/2023 03:12   ECHOCARDIOGRAM COMPLETE Result Date: 06/05/2023    ECHOCARDIOGRAM REPORT   Patient Name:   Tyhir Schwan. Date of Exam: 06/05/2023 Medical Rec #:  [id number]              Height:       70.0 in Accession #:  [id number]             Weight:       292.3 lb Date of Birth:  [birth year]              BSA:          2.453 m Patient Age:    82 years               BP:           129/72 mmHg Patient Gender: M                      HR:           89 bpm. Exam Location:  Inpatient Procedure: 2D Echo, Color Doppler, Cardiac Doppler and Intracardiac            Opacification Agent Indications:    Stroke I63.9  History:        Patient has prior history of Echocardiogram examinations, most                 recent 10/06/2019. Risk Factors:Diabetes, Hypertension and                 Dyslipidemia.  Sonographer:    Tinnie Gosling RDCS Referring Phys: ERIN C LEHNER IMPRESSIONS  1. Left ventricular ejection fraction, by estimation, is 30%. The left ventricle has moderately decreased function. The left ventricle demonstrates global hypokinesis. There is mild left ventricular hypertrophy.  2. Right ventricular systolic function is normal. The right ventricular size is normal.  3. Trivial mitral valve regurgitation.  4. The aortic valve is tricuspid. Aortic valve regurgitation is not visualized.  5. The inferior vena cava is normal in size with greater than 50% respiratory variability, suggesting right atrial pressure of 3 mmHg. Comparison(s): The left ventricular function is worsened. FINDINGS  Left  Ventricle: Left ventricular ejection fraction, by estimation, is 30%. The left ventricle has moderately decreased function. The left ventricle demonstrates global hypokinesis. Definity  contrast agent was given IV to delineate the left ventricular endocardial borders. The left ventricular internal cavity size was normal in size. There is mild left ventricular hypertrophy. Right Ventricle: The right ventricular size is normal. Right vetricular wall thickness was not assessed. Right ventricular systolic function is normal. Left Atrium: Left atrial size was normal in size. Right Atrium: Right atrial size was normal in size. Pericardium: There is no evidence of pericardial effusion. Mitral Valve: There is mild thickening of the mitral valve leaflet(s). Mild to moderate mitral annular calcification. Trivial mitral valve regurgitation. Tricuspid Valve: The tricuspid valve is normal in structure. Tricuspid valve regurgitation is trivial. Aortic Valve: The aortic valve is tricuspid. Aortic valve regurgitation is not visualized. Pulmonic Valve: The pulmonic valve was normal in structure. Pulmonic valve regurgitation is not visualized. Aorta: The aortic root and ascending aorta are structurally normal, with no evidence of dilitation. Venous: The inferior vena cava is normal in size with greater than 50% respiratory variability, suggesting right atrial pressure of 3 mmHg. IAS/Shunts: The interatrial septum was not assessed.  LEFT VENTRICLE PLAX 2D LVIDd:         5.00 cm   Diastology LVIDs:         4.40 cm   LV e' lateral: 5.33 cm/s LV PW:         1.20 cm LV IVS:        1.10 cm LVOT diam:     2.30 cm LV SV:  55 LV SV Index:   23 LVOT Area:     4.15 cm  RIGHT VENTRICLE RV S prime:     14.40 cm/s LEFT ATRIUM         Index LA diam:    4.60 cm 1.88 cm/m  AORTIC VALVE LVOT Vmax:   65.50 cm/s LVOT Vmean:  45.200 cm/s LVOT VTI:    0.133 m  AORTA Ao Root diam: 3.00 cm Ao Asc diam:  3.50 cm  SHUNTS Systemic VTI:  0.13 m Systemic  Diam: 2.30 cm Vina Gull MD Electronically signed by Vina Gull MD Signature Date/Time: 06/05/2023/9:33:38 PM    Final    DG Abd Portable 1V Result Date: 06/05/2023 CLINICAL DATA:  Feeding tube placement. EXAM: PORTABLE ABDOMEN - 1 VIEW COMPARISON:  None Available. FINDINGS: Distal tip of feeding tube is seen in expected position of distal stomach. IMPRESSION: Distal tip of feeding tube seen in expected position of distal stomach. Electronically Signed   By: Lynwood Landy Raddle M.D.   On: 06/05/2023 15:49   CT HEAD WO CONTRAST Result Date: 06/05/2023 CLINICAL DATA:  Stroke, follow-up. Status post intracranial mechanical thrombectomy and stenting of the left ICA bifurcation. EXAM: CT HEAD WITHOUT CONTRAST TECHNIQUE: Contiguous axial images were obtained from the base of the skull through the vertex without intravenous contrast. RADIATION DOSE REDUCTION: This exam was performed according to the departmental dose-optimization program which includes automated exposure control, adjustment of the mA and/or kV according to patient size and/or use of iterative reconstruction technique. COMPARISON:  CT head without contrast and CT angio head and neck 06/05/2023. By plain CT 06/05/2023. FINDINGS: Brain: The study is mildly degraded by patient motion. The left insular and left opercular infarct is somewhat obscured by patient motion. The cortex is slightly hyperdense, potentially reflecting reperfusion. No hemorrhage is present. Basal ganglia are intact. Subcortical white matter hypoattenuation in the high left frontal lobe is new. Mild right-sided white matter disease is stable. Basal ganglia are intact. A remote lacunar infarct is again noted in the right cerebellum. The brainstem and cerebellum are otherwise within normal limits. Vascular: Atherosclerotic calcifications are present within the cavernous internal carotid arteries and at the normal origin of both vertebral arteries. No hyperdense vessel is present. Skull:  Calvarium is intact. No focal lytic or blastic lesions are present. No significant extracranial soft tissue lesion is present. Sinuses/Orbits: The paranasal sinuses and mastoid air cells are clear. Bilateral lens replacements are noted. Globes and orbits are otherwise unremarkable. IMPRESSION: 1. The left insular and left opercular infarct is somewhat obscured by patient motion. 2. The cortex is slightly hyperdense, potentially reflecting reperfusion. 3. No hemorrhage. 4. Subcortical white matter hypoattenuation in the high left frontal lobe is new. This may reflect ischemic changes or edema related to the reperfusion. 5. Remote lacunar infarct of the right cerebellum. 6. Stable mild right-sided white matter disease. This likely reflects the sequela of chronic microvascular ischemia. Electronically Signed   By: Lonni Necessary M.D.   On: 06/05/2023 14:38    Cardiac Studies   See above  Assessment   Principal Problem:   Acute ischemic stroke [facility]) Active Problems:   Malnutrition of moderate degree   Acute systolic heart failure (HCC)   AKI (acute kidney injury) [facility])   Plan   Mr. Higginson has had some neurologic improvement overnight. Creatinine is higher today, however, at 1.93. He is recorded 2L positive - there is some leg edema, JVP difficult to assess. Will give lasix  80 mg IV  x 1 today. This may represent low output heart failure. Cannot titrate GDMT today given rising creatinine. He also has a modified swallow restriction and remains on tube feeds.  Discussed with Dr. Jerri.  Time Spent Directly with Patient:  I have spent a total of 45 minutes with the patient reviewing hospital notes, telemetry, EKGs, labs and examining the patient as well as establishing an assessment and plan that was discussed personally with the patient.  > 50% of time was spent in direct patient care.  Length of Stay:  LOS: 2 days   Vinie KYM Maxcy, MD, [facility], FACP  [place]  [facility]  Medical  Director of the Advanced Lipid Disorders &  Cardiovascular Risk Reduction Clinic Diplomate of the American Board of Clinical Lipidology Attending Cardiologist  Direct Dial: [phone]  Fax: [phone]  Website:  [email or web address] Vinie BROCKS Necha Harries 06/07/2023, 10:45 AM

## 2023-06-08 ENCOUNTER — Inpatient Hospital Stay (HOSPITAL_COMMUNITY): Payer: No Typology Code available for payment source

## 2023-06-08 DIAGNOSIS — I639 Cerebral infarction, unspecified: Secondary | ICD-10-CM | POA: Diagnosis not present

## 2023-06-08 DIAGNOSIS — I5023 Acute on chronic systolic (congestive) heart failure: Secondary | ICD-10-CM | POA: Diagnosis not present

## 2023-06-08 DIAGNOSIS — N179 Acute kidney failure, unspecified: Secondary | ICD-10-CM | POA: Diagnosis not present

## 2023-06-08 LAB — CBC
HCT: 29.6 % — ABNORMAL LOW (ref 39.0–52.0)
Hemoglobin: 9.3 g/dL — ABNORMAL LOW (ref 13.0–17.0)
MCH: 30.4 pg (ref 26.0–34.0)
MCHC: 31.4 g/dL (ref 30.0–36.0)
MCV: 96.7 fL (ref 80.0–100.0)
Platelets: 140 10*3/uL — ABNORMAL LOW (ref 150–400)
RBC: 3.06 MIL/uL — ABNORMAL LOW (ref 4.22–5.81)
RDW: 13.8 % (ref 11.5–15.5)
WBC: 7.4 10*3/uL (ref 4.0–10.5)
nRBC: 0 % (ref 0.0–0.2)

## 2023-06-08 LAB — BASIC METABOLIC PANEL
Anion gap: 8 (ref 5–15)
BUN: 42 mg/dL — ABNORMAL HIGH (ref 8–23)
CO2: 25 mmol/L (ref 22–32)
Calcium: 8.8 mg/dL — ABNORMAL LOW (ref 8.9–10.3)
Chloride: 112 mmol/L — ABNORMAL HIGH (ref 98–111)
Creatinine, Ser: 2.03 mg/dL — ABNORMAL HIGH (ref 0.61–1.24)
GFR, Estimated: 32 mL/min — ABNORMAL LOW (ref >60)
Glucose, Bld: 283 mg/dL — ABNORMAL HIGH (ref 70–99)
Potassium: 4.2 mmol/L (ref 3.5–5.1)
Sodium: 145 mmol/L (ref 135–145)

## 2023-06-08 LAB — GLUCOSE, CAPILLARY
Glucose-Capillary: 214 mg/dL — ABNORMAL HIGH (ref 70–99)
Glucose-Capillary: 229 mg/dL — ABNORMAL HIGH (ref 70–99)
Glucose-Capillary: 241 mg/dL — ABNORMAL HIGH (ref 70–99)
Glucose-Capillary: 249 mg/dL — ABNORMAL HIGH (ref 70–99)
Glucose-Capillary: 281 mg/dL — ABNORMAL HIGH (ref 70–99)
Glucose-Capillary: 286 mg/dL — ABNORMAL HIGH (ref 70–99)

## 2023-06-08 LAB — BRAIN NATRIURETIC PEPTIDE: B Natriuretic Peptide: 1006.3 pg/mL — ABNORMAL HIGH (ref 0.0–100.0)

## 2023-06-08 MED ORDER — INSULIN GLARGINE-YFGN 100 UNIT/ML ~~LOC~~ SOLN
3.0000 [IU] | Freq: Every day | SUBCUTANEOUS | Status: DC
  Administered 2023-06-08 – 2023-06-14 (×7): 3 [IU] via SUBCUTANEOUS
  Filled 2023-06-08 (×9): qty 0.03

## 2023-06-08 MED ORDER — INSULIN ASPART 100 UNIT/ML IJ SOLN: 0.0000 [IU] | INTRAMUSCULAR | Status: DC

## 2023-06-08 MED ORDER — INSULIN ASPART 100 UNIT/ML IJ SOLN
0.0000 [IU] | INTRAMUSCULAR | Status: DC
  Administered 2023-06-08: 5 [IU] via SUBCUTANEOUS
  Administered 2023-06-08: 3 [IU] via SUBCUTANEOUS
  Administered 2023-06-08 – 2023-06-09 (×2): 5 [IU] via SUBCUTANEOUS
  Administered 2023-06-09: 7 [IU] via SUBCUTANEOUS
  Administered 2023-06-09 (×4): 3 [IU] via SUBCUTANEOUS
  Administered 2023-06-10: 2 [IU] via SUBCUTANEOUS
  Administered 2023-06-10 (×2): 5 [IU] via SUBCUTANEOUS
  Administered 2023-06-10: 3 [IU] via SUBCUTANEOUS
  Administered 2023-06-10: 5 [IU] via SUBCUTANEOUS
  Administered 2023-06-10: 1 [IU] via SUBCUTANEOUS
  Administered 2023-06-11: 5 [IU] via SUBCUTANEOUS
  Administered 2023-06-11: 3 [IU] via SUBCUTANEOUS
  Administered 2023-06-11 (×2): 1 [IU] via SUBCUTANEOUS
  Administered 2023-06-11: 7 [IU] via SUBCUTANEOUS
  Administered 2023-06-11: 3 [IU] via SUBCUTANEOUS
  Administered 2023-06-12 (×2): 2 [IU] via SUBCUTANEOUS
  Administered 2023-06-12: 1 [IU] via SUBCUTANEOUS
  Administered 2023-06-12: 5 [IU] via SUBCUTANEOUS
  Administered 2023-06-12: 3 [IU] via SUBCUTANEOUS
  Administered 2023-06-12: 2 [IU] via SUBCUTANEOUS
  Administered 2023-06-12: 1 [IU] via SUBCUTANEOUS
  Administered 2023-06-13 (×4): 2 [IU] via SUBCUTANEOUS
  Administered 2023-06-13 – 2023-06-14 (×2): 3 [IU] via SUBCUTANEOUS
  Administered 2023-06-14: 1 [IU] via SUBCUTANEOUS
  Administered 2023-06-14 (×3): 2 [IU] via SUBCUTANEOUS

## 2023-06-08 NOTE — Plan of Care (Signed)
  Problem: Education: Goal: Knowledge of disease or condition will improve Outcome: Progressing Goal: Knowledge of secondary prevention will improve (MUST DOCUMENT ALL) Outcome: Progressing Goal: Knowledge of patient specific risk factors will improve (DELETE if not current risk factor) Outcome: Progressing   Problem: Ischemic Stroke/TIA Tissue Perfusion: Goal: Complications of ischemic stroke/TIA will be minimized Outcome: Progressing   Problem: Coping: Goal: Will verbalize positive feelings about self Outcome: Progressing Goal: Will identify appropriate support needs Outcome: Progressing   Problem: Health Behavior/Discharge Planning: Goal: Ability to manage health-related needs will improve Outcome: Progressing Goal: Goals will be collaboratively established with patient/family Outcome: Progressing   Problem: Self-Care: Goal: Ability to participate in self-care as condition permits will improve Outcome: Progressing Goal: Verbalization of feelings and concerns over difficulty with self-care will improve Outcome: Progressing Goal: Ability to communicate needs accurately will improve Outcome: Progressing   Problem: Nutrition: Goal: Risk of aspiration will decrease Outcome: Progressing Goal: Dietary intake will improve Outcome: Progressing   Problem: Education: Goal: Understanding of CV disease, CV risk reduction, and recovery process will improve Outcome: Progressing Goal: Individualized Educational Video(s) Outcome: Progressing   Problem: Activity: Goal: Ability to return to baseline activity level will improve Outcome: Progressing   Problem: Cardiovascular: Goal: Ability to achieve and maintain adequate cardiovascular perfusion will improve Outcome: Progressing Goal: Vascular access site(s) Level 0-1 will be maintained Outcome: Progressing   Problem: Health Behavior/Discharge Planning: Goal: Ability to safely manage health-related needs after discharge will  improve Outcome: Progressing   Problem: Education: Goal: Knowledge of General Education information will improve Description: Including pain rating scale, medication(s)/side effects and non-pharmacologic comfort measures Outcome: Progressing   Problem: Health Behavior/Discharge Planning: Goal: Ability to manage health-related needs will improve Outcome: Progressing   Problem: Clinical Measurements: Goal: Ability to maintain clinical measurements within normal limits will improve Outcome: Progressing Goal: Will remain free from infection Outcome: Progressing Goal: Diagnostic test results will improve Outcome: Progressing Goal: Respiratory complications will improve Outcome: Progressing Goal: Cardiovascular complication will be avoided Outcome: Progressing   Problem: Activity: Goal: Risk for activity intolerance will decrease Outcome: Progressing   Problem: Nutrition: Goal: Adequate nutrition will be maintained Outcome: Progressing   Problem: Coping: Goal: Level of anxiety will decrease Outcome: Progressing   Problem: Elimination: Goal: Will not experience complications related to bowel motility Outcome: Progressing Goal: Will not experience complications related to urinary retention Outcome: Progressing   Problem: Pain Managment: Goal: General experience of comfort will improve and/or be controlled Outcome: Progressing   Problem: Safety: Goal: Ability to remain free from injury will improve Outcome: Progressing   Problem: Skin Integrity: Goal: Risk for impaired skin integrity will decrease Outcome: Progressing

## 2023-06-08 NOTE — Progress Notes (Signed)
 STROKE TEAM PROGRESS NOTE   SUBJECTIVE (INTERVAL HISTORY)  Doing well.  No issues this morning.  Family at bedside.  Wondering when they will go to rehab.  His hemoglobin dropped today will continue to monitor.  Sugars mildly elevated due to tube feeds and on insulin  at home.  Not able to tolerate p.o. intake.  Will start Semglee  3 units daily and monitor  I's and O's appears to be match from overnight with no fluid retention.  Continue to follow recommendations per cardiology for his heart failure they will consider loop monitor.  OBJECTIVE Temp:  [98.6 F (37 C)-99.8 F (37.7 C)] 99.7 F (37.6 C) (02/08 0421) Pulse Rate:  [63-77] 70 (02/08 0421) Cardiac Rhythm: Normal sinus rhythm (02/08 0719) Resp:  [17-26] 20 (02/08 0421) BP: (103-149)/(55-101) 145/63 (02/08 0421) SpO2:  [91 %-100 %] 100 % (02/08 0421)  Recent Labs  Lab 06/07/23 1517 06/07/23 1948 06/08/23 0419 06/08/23 0850 06/08/23 1202  GLUCAP 184* 180* 249* 281* 214*   Recent Labs  Lab 06/05/23 0815 06/05/23 0817 06/05/23 1500 06/05/23 1811 06/06/23 0557 06/06/23 1648 06/07/23 0413 06/08/23 0556  NA 142 142  --   --  139  --  143 145  K 3.8 3.7  --   --  5.0  --  4.2 4.2  CL 109 108  --   --  110  --  110 112*  CO2 20*  --   --   --  16*  --  20* 25  GLUCOSE 203* 201*  --   --  161*  --  163* 283*  BUN 18 21  --   --  23  --  31* 42*  CREATININE 1.59* 1.60*  --   --  1.66*  --  1.98* 2.03*  CALCIUM  9.4  --   --   --  8.7*  --  9.5 8.8*  MG  --   --  1.8 1.9 2.3 2.2  --   --   PHOS  --   --  2.5 1.9* 3.0 3.4  --   --    Recent Labs  Lab 06/05/23 0815 06/06/23 0557  AST 20 26  ALT 13 12  ALKPHOS 76 72  BILITOT 1.2 1.1  PROT 6.5 6.1*  ALBUMIN  3.6 3.3*   Recent Labs  Lab 06/05/23 0815 06/05/23 0817 06/06/23 0557 06/07/23 0413 06/08/23 0556  WBC 6.8  --  10.0 8.4 7.4  NEUTROABS 3.1  --   --   --   --   HGB 11.9* 12.6* 11.4* 11.8* 9.3*  HCT 37.8* 37.0* 36.3* 37.1* 29.6*  MCV 97.4  --  97.6  97.4 96.7  PLT 181  --  177 151 140*   No results for input(s): CKTOTAL, CKMB, CKMBINDEX, TROPONINI in the last 168 hours. Recent Labs    06/06/23 0557  LABPROT 16.2*  INR 1.3*   Recent Labs    06/05/23 1653  COLORURINE YELLOW  LABSPEC >1.046*  PHURINE 5.0  GLUCOSEU NEGATIVE  HGBUR NEGATIVE  BILIRUBINUR NEGATIVE  KETONESUR 5*  PROTEINUR NEGATIVE  NITRITE NEGATIVE  LEUKOCYTESUR NEGATIVE       Component Value Date/Time   CHOL 183 06/06/2023 0557   TRIG 81 06/06/2023 0557   HDL 60 06/06/2023 0557   CHOLHDL 3.1 06/06/2023 0557   VLDL 16 06/06/2023 0557   LDLCALC 107 (H) 06/06/2023 0557   LDLCALC 223 (H) 05/31/2021 0000   Lab Results  Component Value Date   HGBA1C 6.1 (H)  06/05/2023      Component Value Date/Time   LABOPIA NONE DETECTED 06/05/2023 1653   COCAINSCRNUR NONE DETECTED 06/05/2023 1653   LABBENZ NONE DETECTED 06/05/2023 1653   AMPHETMU NONE DETECTED 06/05/2023 1653   THCU NONE DETECTED 06/05/2023 1653   LABBARB NONE DETECTED 06/05/2023 1653    Recent Labs  Lab 06/05/23 0815  ETH <10    I have personally reviewed the radiological images below and agree with the radiology interpretations.  DG Swallowing Func-Speech Pathology Result Date: 06/07/2023 Table formatting from the original result was not included. Modified Barium Swallow Study Study completed and documented by Waddell Music, SLP Student Supervised and reviewed by Consuelo Fort, MA CCC-SLP [facility] Secure Chat Preferred Office [phone] Patient Details Name: Karsten Howry. MRN: [id number] Date of Birth: 1940-08-15 Today's Date: 06/07/2023 HPI/PMH: HPI: Pt is a 83 y.o. male who was admitted as a code stroke on 02/05 due to acute onset of right-sided weakness, right facial droop and aphasia. MRI from 02/06 displayed an evolving acute left MCA infarct, most pronounced at the left insula and left frontal operculum. Pt has intentionally lost 300 pounds in the recent  past, with pre-loss weight at ~600Ib. PMHx includes hypertension, diabetes, CKD, prior CVA. Clinical Impression: Clinical Impression: Pt presented with a moderate-severe oropharyngeal dysphagia (DIGEST Score: 3) primarily characterized by silent aspiration of thin liquid, silent penetration of nectar-thick liquid, diffuse pharyngeal residue collection during thin/nectar-thick consistencies, and posterior-escape of bolus across multiple consistencies. Pt positioning was a potential limitation for examination due to consistent posterior head position. More anterior head positioning during nectar-thick liquid displayed less pharyngeal residue, increased hyo-laryngeal excursion, and overall better airway safety. This positioning was not present throughout due to pt's receptive language deficits to understand cues. Alternating between honey-thick and puree consistencies provided more oropharyngeal bolus clearance and no signs of aspiration. Recommend a dysphagia 1 (puree) and honey-thick liquid diet to optimize pt's airway safety and potential for meeting nutritional needs. Slow rate, small bites/sips, natural head posturing, and multiple swallows are compensatory strategies that displayed best efficiency with pt. F/u with SLP for family education, compensatory strategy training, and upgraded diet trials is recommended. Factors that may increase risk of adverse event in presence of aspiration Noe & Lianne 2021): Factors that may increase risk of adverse event in presence of aspiration Noe & Lianne 2021): Weak cough Recommendations/Plan: Swallowing Evaluation Recommendations Swallowing Evaluation Recommendations Recommendations: PO diet PO Diet Recommendation: Dysphagia 1 (Pureed); Moderately thick liquids (Level 3, honey thick) Liquid Administration via: Spoon Medication Administration: Crushed with puree Supervision: Full supervision/cueing for swallowing strategies; Full assist for feeding Swallowing  strategies  : Slow rate; Small bites/sips; Multiple dry swallows after each bite/sip (Pt needs to be in a neutral head position (no posterior head tilt).) Postural changes: Position pt fully upright for meals; Stay upright 30-60 min after meals Oral care recommendations: Oral care BID (2x/day); Staff/trained caregiver to provide oral care Caregiver Recommendations: Have oral suction available Treatment Plan Treatment Plan Treatment recommendations: Therapy as outlined in treatment plan below Follow-up recommendations: Follow physicians's recommendations for discharge plan and follow up therapies Functional status assessment: Patient has had a recent decline in their functional status and demonstrates the ability to make significant improvements in function in a reasonable and predictable amount of time. Treatment frequency: Min 2x/week Treatment duration: 2 weeks Interventions: Aspiration precaution training; Compensatory techniques; Patient/family education; Diet toleration management by SLP; Trials of upgraded texture/liquids Recommendations Recommendations for follow up therapy are one component  of a multi-disciplinary discharge planning process, led by the attending physician.  Recommendations may be updated based on patient status, additional functional criteria and insurance authorization. Assessment: Orofacial Exam: Orofacial Exam Oral Cavity - Dentition: Adequate natural dentition Orofacial Anatomy: WFL Anatomy: Anatomy: WFL Boluses Administered: Boluses Administered Boluses Administered: Thin liquids (Level 0); Mildly thick liquids (Level 2, nectar thick); Moderately thick liquids (Level 3, honey thick); Puree; Solid  Oral Impairment Domain: Oral Impairment Domain Lip Closure: Escape progressing to mid-chin Tongue control during bolus hold: Posterior escape of greater than half of bolus Bolus preparation/mastication: -- (Pt did not chew solid and SLP had to remove it from mouth.) Bolus transport/lingual  motion: Repetitive/disorganized tongue motion Oral residue: Residue collection on oral structures Location of oral residue : Tongue; Palate Initiation of pharyngeal swallow : Pyriform sinuses  Pharyngeal Impairment Domain: Pharyngeal Impairment Domain Soft palate elevation: No bolus between soft palate (SP)/pharyngeal wall (PW) Laryngeal elevation: Partial superior movement of thyroid  cartilage/partial approximation of arytenoids to epiglottic petiole Anterior hyoid excursion: Partial anterior movement Epiglottic movement: Partial inversion Laryngeal vestibule closure: Incomplete, narrow column air/contrast in laryngeal vestibule Pharyngeal stripping wave : Present - complete Pharyngeal contraction (A/P view only): N/A Pharyngoesophageal segment opening: Complete distension and complete duration, no obstruction of flow Tongue base retraction: Narrow column of contrast or air between tongue base and PPW Pharyngeal residue: Collection of residue within or on pharyngeal structures Location of pharyngeal residue: Diffuse (>3 areas)  Esophageal Impairment Domain: Esophageal Impairment Domain Esophageal clearance upright position: -- (Not tested.) Pill: No data recorded Penetration/Aspiration Scale Score: Penetration/Aspiration Scale Score 1.  Material does not enter airway: Puree; Moderately thick liquids (Level 3, honey thick) 3.  Material enters airway, remains ABOVE vocal cords and not ejected out: Mildly thick liquids (Level 2, nectar thick) 8.  Material enters airway, passes BELOW cords without attempt by patient to eject out (silent aspiration) : Thin liquids (Level 0) Compensatory Strategies: Compensatory Strategies Compensatory strategies: Yes Straw: Ineffective Multiple swallows: Effective (Required max cueing due to pt's language.) Effective Multiple Swallows: Puree; Moderately thick liquid (Level 3, honey thick); Mildly thick liquid (Level 2, nectar thick)   General Information: Caregiver present: No  Diet  Prior to this Study: NPO   Temperature : Normal   Respiratory Status: WFL   Supplemental O2: None (Room air)   No data recorded Behavior/Cognition: Alert; Cooperative; Pleasant mood; Requires cueing Self-Feeding Abilities: Needs assist with self-feeding Baseline vocal quality/speech: Dysphonic Volitional Cough: Unable to elicit Volitional Swallow: Able to elicit Exam Limitations: Poor positioning Goal Planning: Prognosis for improved oropharyngeal function: Good Barriers to Reach Goals: Language deficits No data recorded No data recorded Consulted and agree with results and recommendations: Pt unable/family or caregiver not available Pain: Pain Assessment Pain Assessment: No/denies pain Faces Pain Scale: 0 Facial Expression: 0 Body Movements: 0 Muscle Tension: 0 Compliance with ventilator (intubated pts.): N/A Vocalization (extubated pts.): 0 CPOT Total: 0 End of Session: Start Time:SLP Start Time (ACUTE ONLY): 0903 Stop Time: SLP Stop Time (ACUTE ONLY): 0930 Time Calculation:SLP Time Calculation (min) (ACUTE ONLY): 27 min Charges: SLP Evaluations $ SLP Speech Visit: 1 Visit SLP Evaluations $BSS Swallow: 1 Procedure $MBS Swallow: 1 Procedure $ SLP EVAL LANGUAGE/SOUND PRODUCTION: 1 Procedure $Swallowing Treatment: 1 Procedure SLP visit diagnosis: SLP Visit Diagnosis: Dysphagia, oropharyngeal phase (R13.12) Past Medical History: Past Medical History: Diagnosis Date  Anemia of chronic disease   Ankle fracture 02/15/2016  Cancer (HCC)   Prostate  Chronic constipation   Chronic kidney disease  stage III  Diabetes mellitus without complication (HCC)   type II   Diabetic peripheral neuropathy (HCC)   Failure to thrive (0-17)   Fracture of left lower leg   Gout   Hyperlipidemia   Hypertension   Morbid obesity (HCC)   Unstable gait  Past Surgical History: Past Surgical History: Procedure Laterality Date  IR CT HEAD LTD  06/05/2023  IR INTRAVSC STENT CERV CAROTID W/O EMB-PROT MOD SED INC ANGIO  06/05/2023  IR PERCUTANEOUS ART  THROMBECTOMY/INFUSION INTRACRANIAL INC DIAG ANGIO  06/05/2023  IR US  GUIDE VASC ACCESS RIGHT  06/05/2023  ORIF ANKLE FRACTURE Left 02/29/2016  Procedure: OPEN REDUCTION INTERNAL FIXATION (ORIF) ANKLE FRACTURE;  Surgeon: Selinda Belvie Gosling, MD;  Location: [facility];  Service: Orthopedics;  Laterality: Left;  PROSTATE SURGERY    RADIOLOGY WITH ANESTHESIA N/A 06/05/2023  Procedure: IR WITH ANESTHESIA;  Surgeon: Radiologist, Medication, MD;  Location: [facility];  Service: Radiology;  Laterality: N/A; DeBlois, Consuelo Fitch 06/07/2023, 12:15 PM  CT HEAD WO CONTRAST ( ) Result Date: 06/07/2023 CLINICAL DATA:  83 year old male status post code stroke presentation, left MCA M2 occlusion, with left MCA infarct with confluent petechial hemorrhage. EXAM: CT HEAD WITHOUT CONTRAST TECHNIQUE: Contiguous axial images were obtained from the base of the skull through the vertex without intravenous contrast. RADIATION DOSE REDUCTION: This exam was performed according to the departmental dose-optimization program which includes automated exposure control, adjustment of the mA and/or kV according to patient size and/or use of iterative reconstruction technique. COMPARISON:  Brain MRI yesterday.  Head CT 06/05/2023. FINDINGS: Brain: Confluent mixed density infarct in the left MCA middle division, epicenter at the insula and operculum. Confluent petechial hemorrhage on series 3, image 16 appears stable from the MRI. Superimposed trace subarachnoid blood is difficult to exclude by CT (series 3, image 13), but was not apparent on MRI. Stable mild mass effect on the left lateral ventricle with only trace rightward midline shift (series 3, image 16). No ventriculomegaly. Elsewhere gray-white differentiation is stable from the presentation CT. Basilar cisterns remain patent. Vascular: Resolved hyperdense left MCA since presentation. Calcified atherosclerosis at the skull base. Skull: Stable, intact. Sinuses/Orbits: Visualized paranasal sinuses and  mastoids are stable and well aerated. Other: Left nasoenteric tube now in place. Stable orbit and scalp soft tissues. IMPRESSION: 1. Stable by CT confluent Left MCA middle division infarct with petechial hemorrhage (Heidelberg classification 1b: HI2, confluent petechiae, no mass effect). Questionable trace superimposed SAH, but was not apparent on MRI. 2. Stable minimal intracranial mass effect. 3. No new intracranial abnormality. Electronically Signed   By: VEAR Hurst M.D.   On: 06/07/2023 05:37   MR BRAIN WO CONTRAST Result Date: 06/06/2023 CLINICAL DATA:  Follow-up examination for stroke. EXAM: MRI HEAD WITHOUT CONTRAST TECHNIQUE: Multiplanar, multiecho pulse sequences of the brain and surrounding structures were obtained without intravenous contrast. COMPARISON:  Comparison made to multiple previous exams from 06/05/2023. FINDINGS: Brain: Examination degraded by motion artifact. Cerebral volume within normal limits for age. Patchy T2/FLAIR hyperintensity involving the periventricular deep white matter both cerebral hemispheres, consistent with chronic small vessel ischemic disease, mild in nature. Small remote infarct noted within the right cerebellum. Confluent restricted diffusion involving the left insula and left frontal operculum, consistent with evolving acute left MCA distribution infarct. Area of infarction measures up to approximately 6 cm in AP diameter. Prominent associated susceptibility artifact, consistent with petechial hemorrhage (series 7, image 61) ( Heidelberg classification 1b: HI2, confluent petechiae, no mass effect. No frank or organized hematoma evident  by MRI. No significant regional mass effect. Few additional small foci of infarction noted within the left parieto-occipital region posteriorly (series 3, images 40, 29). Apparent diffusion signal along the right frontal parafalcine region felt to be consistent with artifact related to dural calcification. Note made of a few additional  chronic micro hemorrhages within the left cerebellum and right thalamus. No mass lesion or midline shift. Ventricles normal size without hydrocephalus. No extra-axial fluid collection. Pituitary gland suprasellar region within normal limits. Vascular: Major intracranial vascular flow voids are maintained. Skull and upper cervical spine: Craniocervical junction within normal limits. Bone marrow signal intensity overall within normal limits. No scalp soft tissue abnormality. Sinuses/Orbits: Prior bilateral ocular lens replacement. Paranasal sinuses are clear. No mastoid effusion. Other: None. IMPRESSION: 1. Evolving acute left MCA distribution infarct, most pronounced at the left insula and left frontal operculum. Prominent associated petechial hemorrhage without frank intraparenchymal hematoma (Heidelberg classification 1b: HI2, confluent petechiae, no mass effect). 2. Few additional small foci of acute infarction within the left parieto-occipital region posteriorly. 3. Underlying mild chronic microvascular ischemic disease with small remote right cerebellar infarct. Electronically Signed   By: Morene Hoard M.D.   On: 06/06/2023 03:12   ECHOCARDIOGRAM COMPLETE Result Date: 06/05/2023    ECHOCARDIOGRAM REPORT   Patient Name:   Layten Aiken. Date of Exam: 06/05/2023 Medical Rec #:  [id number]              Height:       70.0 in Accession #:    [id number]             Weight:       292.3 lb Date of Birth:  1940/10/06              BSA:          2.453 m Patient Age:    82 years               BP:           129/72 mmHg Patient Gender: M                      HR:           89 bpm. Exam Location:  Inpatient Procedure: 2D Echo, Color Doppler, Cardiac Doppler and Intracardiac            Opacification Agent Indications:    Stroke I63.9  History:        Patient has prior history of Echocardiogram examinations, most                 recent 10/06/2019. Risk Factors:Diabetes, Hypertension and                 Dyslipidemia.   Sonographer:    Tinnie Gosling RDCS Referring Phys: ERIN C LEHNER IMPRESSIONS  1. Left ventricular ejection fraction, by estimation, is 30%. The left ventricle has moderately decreased function. The left ventricle demonstrates global hypokinesis. There is mild left ventricular hypertrophy.  2. Right ventricular systolic function is normal. The right ventricular size is normal.  3. Trivial mitral valve regurgitation.  4. The aortic valve is tricuspid. Aortic valve regurgitation is not visualized.  5. The inferior vena cava is normal in size with greater than 50% respiratory variability, suggesting right atrial pressure of 3 mmHg. Comparison(s): The left ventricular function is worsened. FINDINGS  Left Ventricle: Left ventricular ejection fraction, by estimation, is 30%. The left ventricle has moderately decreased function.  The left ventricle demonstrates global hypokinesis. Definity  contrast agent was given IV to delineate the left ventricular endocardial borders. The left ventricular internal cavity size was normal in size. There is mild left ventricular hypertrophy. Right Ventricle: The right ventricular size is normal. Right vetricular wall thickness was not assessed. Right ventricular systolic function is normal. Left Atrium: Left atrial size was normal in size. Right Atrium: Right atrial size was normal in size. Pericardium: There is no evidence of pericardial effusion. Mitral Valve: There is mild thickening of the mitral valve leaflet(s). Mild to moderate mitral annular calcification. Trivial mitral valve regurgitation. Tricuspid Valve: The tricuspid valve is normal in structure. Tricuspid valve regurgitation is trivial. Aortic Valve: The aortic valve is tricuspid. Aortic valve regurgitation is not visualized. Pulmonic Valve: The pulmonic valve was normal in structure. Pulmonic valve regurgitation is not visualized. Aorta: The aortic root and ascending aorta are structurally normal, with no evidence of  dilitation. Venous: The inferior vena cava is normal in size with greater than 50% respiratory variability, suggesting right atrial pressure of 3 mmHg. IAS/Shunts: The interatrial septum was not assessed.  LEFT VENTRICLE PLAX 2D LVIDd:         5.00 cm   Diastology LVIDs:         4.40 cm   LV e' lateral: 5.33 cm/s LV PW:         1.20 cm LV IVS:        1.10 cm LVOT diam:     2.30 cm LV SV:         55 LV SV Index:   23 LVOT Area:     4.15 cm  RIGHT VENTRICLE RV S prime:     14.40 cm/s LEFT ATRIUM         Index LA diam:    4.60 cm 1.88 cm/m  AORTIC VALVE LVOT Vmax:   65.50 cm/s LVOT Vmean:  45.200 cm/s LVOT VTI:    0.133 m  AORTA Ao Root diam: 3.00 cm Ao Asc diam:  3.50 cm  SHUNTS Systemic VTI:  0.13 m Systemic Diam: 2.30 cm Vina Gull MD Electronically signed by Vina Gull MD Signature Date/Time: 06/05/2023/9:33:38 PM    Final    DG Abd Portable 1V Result Date: 06/05/2023 CLINICAL DATA:  Feeding tube placement. EXAM: PORTABLE ABDOMEN - 1 VIEW COMPARISON:  None Available. FINDINGS: Distal tip of feeding tube is seen in expected position of distal stomach. IMPRESSION: Distal tip of feeding tube seen in expected position of distal stomach. Electronically Signed   By: Lynwood Landy Raddle M.D.   On: 06/05/2023 15:49   CT HEAD WO CONTRAST Result Date: 06/05/2023 CLINICAL DATA:  Stroke, follow-up. Status post intracranial mechanical thrombectomy and stenting of the left ICA bifurcation. EXAM: CT HEAD WITHOUT CONTRAST TECHNIQUE: Contiguous axial images were obtained from the base of the skull through the vertex without intravenous contrast. RADIATION DOSE REDUCTION: This exam was performed according to the departmental dose-optimization program which includes automated exposure control, adjustment of the mA and/or kV according to patient size and/or use of iterative reconstruction technique. COMPARISON:  CT head without contrast and CT angio head and neck 06/05/2023. By plain CT 06/05/2023. FINDINGS: Brain: The study is mildly  degraded by patient motion. The left insular and left opercular infarct is somewhat obscured by patient motion. The cortex is slightly hyperdense, potentially reflecting reperfusion. No hemorrhage is present. Basal ganglia are intact. Subcortical white matter hypoattenuation in the high left frontal lobe is new. Mild right-sided white  matter disease is stable. Basal ganglia are intact. A remote lacunar infarct is again noted in the right cerebellum. The brainstem and cerebellum are otherwise within normal limits. Vascular: Atherosclerotic calcifications are present within the cavernous internal carotid arteries and at the normal origin of both vertebral arteries. No hyperdense vessel is present. Skull: Calvarium is intact. No focal lytic or blastic lesions are present. No significant extracranial soft tissue lesion is present. Sinuses/Orbits: The paranasal sinuses and mastoid air cells are clear. Bilateral lens replacements are noted. Globes and orbits are otherwise unremarkable. IMPRESSION: 1. The left insular and left opercular infarct is somewhat obscured by patient motion. 2. The cortex is slightly hyperdense, potentially reflecting reperfusion. 3. No hemorrhage. 4. Subcortical white matter hypoattenuation in the high left frontal lobe is new. This may reflect ischemic changes or edema related to the reperfusion. 5. Remote lacunar infarct of the right cerebellum. 6. Stable mild right-sided white matter disease. This likely reflects the sequela of chronic microvascular ischemia. Electronically Signed   By: Lonni Necessary M.D.   On: 06/05/2023 14:38   IR PERCUTANEOUS ART THROMBECTOMY/INFUSION INTRACRANIAL INC DIAG ANGIO Result Date: 06/05/2023 INDICATION: 83 year old male presenting with right-sided weakness and aphasia; NIHSS 28. His last known well was 10 p.m. on 06/04/2023. His past medical history significant for prior stroke, hypertension, diabetes and chronic kidney disease; baseline modified Rankin  scale 0. Head CT showed hypodensity within the left insula, basal ganglia and left frontal operculum (ASPECTS 7). No IV thrombolytic given as patient was outside the window. CT angiogram of the head and neck showed an occlusion of a left M2/MCA anterior division branch. CT perfusion showed a 41 mL core infarct with a 45 mL ischemic penumbra. She was transferred to our service for mechanical thrombectomy. EXAM: ULTRASOUND-GUIDED VASCULAR ACCESS DIAGNOSTIC CEREBRAL ANGIOGRAM MECHANICAL THROMBECTOMY FLAT PANEL HEAD CT LEFT CAROTID STENTING AND ANGIOPLASTY WITHOUT CEREBRAL PROTECTION DEVICE COMPARISON:  CT/CT angiogram of the head and neck June 05, 2023. MEDICATIONS: No antibiotics administered. ANESTHESIA/SEDATION: The procedure was performed under general anesthesia. CONTRAST:  80 mL of Omnipaque  300 milligram/mL FLUOROSCOPY: Radiation Exposure Index (as provided by the fluoroscopic device): 1315 mGy Kerma COMPLICATIONS: None immediate. TECHNIQUE: Informed written consent was obtained from the patient's wife after a thorough discussion of the procedural risks, benefits and alternatives. All questions were addressed. Maximal Sterile Barrier Technique was utilized including caps, mask, sterile gowns, sterile gloves, sterile drape, hand hygiene and skin antiseptic. A timeout was performed prior to the initiation of the procedure. The right groin was prepped and draped in the usual sterile fashion. Using a micropuncture kit and the modified Seldinger technique, access was gained to the right common femoral artery and an 8 French sheath was placed. Real-time ultrasound guidance was utilized for vascular access including the acquisition of a permanent ultrasound image documenting patency of the accessed vessel. Under fluoroscopy, an 8 French Walrus balloon guide catheter was navigated over a 6 French VTK catheter and a 0.035 Terumo Glidewire into the aortic arch. The catheter was placed into the left common carotid  artery and then advanced into the left internal carotid artery. The diagnostic catheter was removed. Frontal and lateral angiograms of the head were obtained. FINDINGS: 1. Ultrasound showed heavily calcified right common femoral artery is maintained patency and caliber. 2. Proximal occlusion of a left M2/MCA anterior division branch. 3. Atherosclerotic changes of the intracranial left ICA with mild stenosis at the distal cavernous segment. 4. 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 PROCEDURE: Using biplane roadmap guidance, a Red 62 aspiration catheter was navigated over Colossus 35 microguidewire into the cavernous segment of the left ICA. The aspiration catheter was then advanced to the level of occlusion and connected to an aspiration pump. Continuous aspiration was performed for 2 minutes. The guide catheter was connected to a VacLok syringe and the guiding catheter balloon was inflated. The aspiration catheter was subsequently removed under constant aspiration. The guide catheter was aspirated for debris. Left internal carotid artery angiograms with frontal and lateral views of the head showed complete recanalization of the left MCA vascular tree. The guide catheter was retracted into the neck. Frontal and lateral angiograms of the neck were obtained. Improvement of the degree of stenosis in the carotid bulb compared to prior CT angiogram. There is prominent luminal irregularity at the carotid bulb residual moderate stenosis. Increased tortuosity of the proximal/mid cervical left ICA with kinking. Left internal carotid artery angiograms with left anterior oblique views of the neck showed evidence of filling defects at the level of the carotid bulb. Flat panel CT of the head was obtained and post processed in a separate workstation with concurrent attending physician supervision. Selected images were sent  to PACS. No evidence of hemorrhagic complication. There is mild contrast staining of the left insular and frontal cortex. Repeat left internal carotid artery angiograms with frontal and lateral views of the head showed Amy seen left M3/MCA branch to the left parietal region. Left common carotid artery angiograms with frontal and lateral views of the neck showed vertebra Gretchen of stenosis at the left ICA bulb with more prominent filling defect. At this point, patient was loaded on cangrelor  followed by continuous drip. Using biplane roadmap guidance, a 4-7 mm Emboshield NAV6 cerebral protection device was advanced into the cervical left ICA. However, multiple attempts to advance a cerebral protection device through the left ICA kinking proved unsuccessful. The cerebral protection device was subsequently removed. Using biplane roadmap guidance, a 10-8 x 40 mm XACT carotid stent was navigated and deployed from the distal left common carotid artery to the proximal left internal carotid artery, proximal to the vessel kinking. Suboptimal stent expansion was noted. Then, a 6 x 30 mm Viatrac balloon was navigated into the recently deployed stent. Angioplasty was performed under fluoroscopy. Left internal carotid artery angiograms with frontal and lateral views of the neck showed adequate stent positioning and expansion with brisk anterograde flow. Left internal carotid artery angiograms with frontal and lateral views of the head showed improvement of anterograde flow in the left MCA vascular tree with brisk anterograde flow. Delayed left common carotid artery angiograms with frontal and lateral views of the neck showed no evidence of clot formation within the stent. The catheter was subsequently Riddle. Right common femoral artery angiogram was obtained in right anterior oblique view. The puncture is at the level of the common femoral artery. The artery has normal caliber, adequate for closure device. The sheath was  exchanged over the wire for an 8 French Angio-Seal which was utilized for access closure. Immediate hemostasis was achieved. IMPRESSION: 1. Successful mechanical thrombectomy for treatment of a proximal left M2/MCA anterior division branch occlusion achieving complete recanalization (TICI 3). 2. 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 PLAN: Continue cangrelor  infusion until patient is transitioned to oral dual antiplatelet therapy. Electronically Signed  By: Katyucia  de Macedo Rodrigues M.D.   On: 06/05/2023 13:16   IR US  Guide Vasc Access Right Result Date: 06/05/2023 INDICATION: 83 year old male presenting with right-sided weakness and aphasia; NIHSS 28. His last known well was 10 p.m. on 06/04/2023. His past medical history significant for prior stroke, hypertension, diabetes and chronic kidney disease; baseline modified Rankin scale 0. Head CT showed hypodensity within the left insula, basal ganglia and left frontal operculum (ASPECTS 7). No IV thrombolytic given as patient was outside the window. CT angiogram of the head and neck showed an occlusion of a left M2/MCA anterior division branch. CT perfusion showed a 41 mL core infarct with a 45 mL ischemic penumbra. She was transferred to our service for mechanical thrombectomy. EXAM: ULTRASOUND-GUIDED VASCULAR ACCESS DIAGNOSTIC CEREBRAL ANGIOGRAM MECHANICAL THROMBECTOMY FLAT PANEL HEAD CT LEFT CAROTID STENTING AND ANGIOPLASTY WITHOUT CEREBRAL PROTECTION DEVICE COMPARISON:  CT/CT angiogram of the head and neck June 05, 2023. MEDICATIONS: No antibiotics administered. ANESTHESIA/SEDATION: The procedure was performed under general anesthesia. CONTRAST:  80 mL of Omnipaque  300 milligram/mL FLUOROSCOPY: Radiation Exposure Index (as provided by the fluoroscopic device): 1315 mGy Kerma COMPLICATIONS: None immediate.  TECHNIQUE: Informed written consent was obtained from the patient's wife after a thorough discussion of the procedural risks, benefits and alternatives. All questions were addressed. Maximal Sterile Barrier Technique was utilized including caps, mask, sterile gowns, sterile gloves, sterile drape, hand hygiene and skin antiseptic. A timeout was performed prior to the initiation of the procedure. The right groin was prepped and draped in the usual sterile fashion. Using a micropuncture kit and the modified Seldinger technique, access was gained to the right common femoral artery and an 8 French sheath was placed. Real-time ultrasound guidance was utilized for vascular access including the acquisition of a permanent ultrasound image documenting patency of the accessed vessel. Under fluoroscopy, an 8 French Walrus balloon guide catheter was navigated over a 6 French VTK catheter and a 0.035 Terumo Glidewire into the aortic arch. The catheter was placed into the left common carotid artery and then advanced into the left internal carotid artery. The diagnostic catheter was removed. Frontal and lateral angiograms of the head were obtained. FINDINGS: 1. Ultrasound showed heavily calcified right common femoral artery is maintained patency and caliber. 2. Proximal occlusion of a left M2/MCA anterior division branch. 3. Atherosclerotic changes of the intracranial left ICA with mild stenosis at the distal cavernous segment. 4. 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 PROCEDURE: Using biplane roadmap guidance, a Red 62 aspiration catheter was navigated over Colossus 35 microguidewire into the cavernous segment of the left ICA. The aspiration catheter was then advanced to the level of occlusion and connected to an aspiration pump. Continuous aspiration was performed for 2 minutes. The guide catheter was connected to a  VacLok syringe and the guiding catheter balloon was inflated. The aspiration catheter was subsequently removed under constant aspiration. The guide catheter was aspirated for debris. Left internal carotid artery angiograms with frontal and lateral views of the head showed complete recanalization of the left MCA vascular tree. The guide catheter was retracted into the neck. Frontal and lateral angiograms of the neck were obtained. Improvement of the degree of stenosis in the carotid bulb compared to prior CT angiogram. There is prominent luminal irregularity at the carotid bulb residual moderate stenosis. Increased tortuosity of the proximal/mid cervical left ICA with kinking. Left internal carotid artery angiograms  with left anterior oblique views of the neck showed evidence of filling defects at the level of the carotid bulb. Flat panel CT of the head was obtained and post processed in a separate workstation with concurrent attending physician supervision. Selected images were sent to PACS. No evidence of hemorrhagic complication. There is mild contrast staining of the left insular and frontal cortex. Repeat left internal carotid artery angiograms with frontal and lateral views of the head showed Amy seen left M3/MCA branch to the left parietal region. Left common carotid artery angiograms with frontal and lateral views of the neck showed vertebra Gretchen of stenosis at the left ICA bulb with more prominent filling defect. At this point, patient was loaded on cangrelor  followed by continuous drip. Using biplane roadmap guidance, a 4-7 mm Emboshield NAV6 cerebral protection device was advanced into the cervical left ICA. However, multiple attempts to advance a cerebral protection device through the left ICA kinking proved unsuccessful. The cerebral protection device was subsequently removed. Using biplane roadmap guidance, a 10-8 x 40 mm XACT carotid stent was navigated and deployed from the distal left common  carotid artery to the proximal left internal carotid artery, proximal to the vessel kinking. Suboptimal stent expansion was noted. Then, a 6 x 30 mm Viatrac balloon was navigated into the recently deployed stent. Angioplasty was performed under fluoroscopy. Left internal carotid artery angiograms with frontal and lateral views of the neck showed adequate stent positioning and expansion with brisk anterograde flow. Left internal carotid artery angiograms with frontal and lateral views of the head showed improvement of anterograde flow in the left MCA vascular tree with brisk anterograde flow. Delayed left common carotid artery angiograms with frontal and lateral views of the neck showed no evidence of clot formation within the stent. The catheter was subsequently Riddle. Right common femoral artery angiogram was obtained in right anterior oblique view. The puncture is at the level of the common femoral artery. The artery has normal caliber, adequate for closure device. The sheath was exchanged over the wire for an 8 French Angio-Seal which was utilized for access closure. Immediate hemostasis was achieved. IMPRESSION: 1. Successful mechanical thrombectomy for treatment of a proximal left M2/MCA anterior division branch occlusion achieving complete recanalization (TICI 3). 2. 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 PLAN: Continue cangrelor  infusion until patient is transitioned to oral dual antiplatelet therapy. Electronically Signed   By: Katyucia  de Macedo Rodrigues M.D.   On: 06/05/2023 13:16   IR INTRAVSC STENT CERV CAROTID W/O EMB-PROT MOD SED Result Date: 06/05/2023 INDICATION: 83 year old male presenting with right-sided weakness and aphasia; NIHSS 28. His last known well was 10 p.m. on 06/04/2023. His past medical history significant for prior stroke,  hypertension, diabetes and chronic kidney disease; baseline modified Rankin scale 0. Head CT showed hypodensity within the left insula, basal ganglia and left frontal operculum (ASPECTS 7). No IV thrombolytic given as patient was outside the window. CT angiogram of the head and neck showed an occlusion of a left M2/MCA anterior division branch. CT perfusion showed a 41 mL core infarct with a 45 mL ischemic penumbra. She was transferred to our service for mechanical thrombectomy. EXAM: ULTRASOUND-GUIDED VASCULAR ACCESS DIAGNOSTIC CEREBRAL ANGIOGRAM MECHANICAL THROMBECTOMY FLAT PANEL HEAD CT LEFT CAROTID STENTING AND ANGIOPLASTY WITHOUT CEREBRAL PROTECTION DEVICE COMPARISON:  CT/CT angiogram of the head and neck June 05, 2023. MEDICATIONS: No antibiotics administered. ANESTHESIA/SEDATION: The  procedure was performed under general anesthesia. CONTRAST:  80 mL of Omnipaque  300 milligram/mL FLUOROSCOPY: Radiation Exposure Index (as provided by the fluoroscopic device): 1315 mGy Kerma COMPLICATIONS: None immediate. TECHNIQUE: Informed written consent was obtained from the patient's wife after a thorough discussion of the procedural risks, benefits and alternatives. All questions were addressed. Maximal Sterile Barrier Technique was utilized including caps, mask, sterile gowns, sterile gloves, sterile drape, hand hygiene and skin antiseptic. A timeout was performed prior to the initiation of the procedure. The right groin was prepped and draped in the usual sterile fashion. Using a micropuncture kit and the modified Seldinger technique, access was gained to the right common femoral artery and an 8 French sheath was placed. Real-time ultrasound guidance was utilized for vascular access including the acquisition of a permanent ultrasound image documenting patency of the accessed vessel. Under fluoroscopy, an 8 French Walrus balloon guide catheter was navigated over a 6 French VTK catheter and a 0.035 Terumo Glidewire  into the aortic arch. The catheter was placed into the left common carotid artery and then advanced into the left internal carotid artery. The diagnostic catheter was removed. Frontal and lateral angiograms of the head were obtained. FINDINGS: 1. Ultrasound showed heavily calcified right common femoral artery is maintained patency and caliber. 2. Proximal occlusion of a left M2/MCA anterior division branch. 3. Atherosclerotic changes of the intracranial left ICA with mild stenosis at the distal cavernous segment. 4. 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 PROCEDURE: Using biplane roadmap guidance, a Red 62 aspiration catheter was navigated over Colossus 35 microguidewire into the cavernous segment of the left ICA. The aspiration catheter was then advanced to the level of occlusion and connected to an aspiration pump. Continuous aspiration was performed for 2 minutes. The guide catheter was connected to a VacLok syringe and the guiding catheter balloon was inflated. The aspiration catheter was subsequently removed under constant aspiration. The guide catheter was aspirated for debris. Left internal carotid artery angiograms with frontal and lateral views of the head showed complete recanalization of the left MCA vascular tree. The guide catheter was retracted into the neck. Frontal and lateral angiograms of the neck were obtained. Improvement of the degree of stenosis in the carotid bulb compared to prior CT angiogram. There is prominent luminal irregularity at the carotid bulb residual moderate stenosis. Increased tortuosity of the proximal/mid cervical left ICA with kinking. Left internal carotid artery angiograms with left anterior oblique views of the neck showed evidence of filling defects at the level of the carotid bulb. Flat panel CT of the head was obtained and post processed in a separate workstation  with concurrent attending physician supervision. Selected images were sent to PACS. No evidence of hemorrhagic complication. There is mild contrast staining of the left insular and frontal cortex. Repeat left internal carotid artery angiograms with frontal and lateral views of the head showed Amy seen left M3/MCA branch to the left parietal region. Left common carotid artery angiograms with frontal and lateral views of the neck showed vertebra Gretchen of stenosis at the left ICA bulb with more prominent filling defect. At this point, patient was loaded on cangrelor  followed by continuous drip. Using biplane roadmap guidance, a 4-7 mm Emboshield NAV6 cerebral protection device was advanced into the cervical left ICA. However, multiple attempts to advance a cerebral protection device through the left ICA kinking proved unsuccessful. The cerebral protection device was  subsequently removed. Using biplane roadmap guidance, a 10-8 x 40 mm XACT carotid stent was navigated and deployed from the distal left common carotid artery to the proximal left internal carotid artery, proximal to the vessel kinking. Suboptimal stent expansion was noted. Then, a 6 x 30 mm Viatrac balloon was navigated into the recently deployed stent. Angioplasty was performed under fluoroscopy. Left internal carotid artery angiograms with frontal and lateral views of the neck showed adequate stent positioning and expansion with brisk anterograde flow. Left internal carotid artery angiograms with frontal and lateral views of the head showed improvement of anterograde flow in the left MCA vascular tree with brisk anterograde flow. Delayed left common carotid artery angiograms with frontal and lateral views of the neck showed no evidence of clot formation within the stent. The catheter was subsequently Riddle. Right common femoral artery angiogram was obtained in right anterior oblique view. The puncture is at the level of the common femoral artery. The  artery has normal caliber, adequate for closure device. The sheath was exchanged over the wire for an 8 French Angio-Seal which was utilized for access closure. Immediate hemostasis was achieved. IMPRESSION: 1. Successful mechanical thrombectomy for treatment of a proximal left M2/MCA anterior division branch occlusion achieving complete recanalization (TICI 3). 2. 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 PLAN: Continue cangrelor  infusion until patient is transitioned to oral dual antiplatelet therapy. Electronically Signed   By: Katyucia  de Macedo Rodrigues M.D.   On: 06/05/2023 13:16   IR CT Head Ltd Result Date: 06/05/2023 INDICATION: 83 year old male presenting with right-sided weakness and aphasia; NIHSS 28. His last known well was 10 p.m. on 06/04/2023. His past medical history significant for prior stroke, hypertension, diabetes and chronic kidney disease; baseline modified Rankin scale 0. Head CT showed hypodensity within the left insula, basal ganglia and left frontal operculum (ASPECTS 7). No IV thrombolytic given as patient was outside the window. CT angiogram of the head and neck showed an occlusion of a left M2/MCA anterior division branch. CT perfusion showed a 41 mL core infarct with a 45 mL ischemic penumbra. She was transferred to our service for mechanical thrombectomy. EXAM: ULTRASOUND-GUIDED VASCULAR ACCESS DIAGNOSTIC CEREBRAL ANGIOGRAM MECHANICAL THROMBECTOMY FLAT PANEL HEAD CT LEFT CAROTID STENTING AND ANGIOPLASTY WITHOUT CEREBRAL PROTECTION DEVICE COMPARISON:  CT/CT angiogram of the head and neck June 05, 2023. MEDICATIONS: No antibiotics administered. ANESTHESIA/SEDATION: The procedure was performed under general anesthesia. CONTRAST:  80 mL of Omnipaque  300 milligram/mL FLUOROSCOPY: Radiation Exposure Index (as provided by the fluoroscopic  device): 1315 mGy Kerma COMPLICATIONS: None immediate. TECHNIQUE: Informed written consent was obtained from the patient's wife after a thorough discussion of the procedural risks, benefits and alternatives. All questions were addressed. Maximal Sterile Barrier Technique was utilized including caps, mask, sterile gowns, sterile gloves, sterile drape, hand hygiene and skin antiseptic. A timeout was performed prior to the initiation of the procedure. The right groin was prepped and draped in the usual sterile fashion. Using a micropuncture kit and the modified Seldinger technique, access was gained to the right common femoral artery and an 8 French sheath was placed. Real-time ultrasound guidance was utilized for vascular access including the acquisition of a permanent ultrasound image documenting patency of the accessed vessel. Under fluoroscopy, an 8 French Walrus balloon guide catheter was navigated over a 6 French VTK catheter and a 0.035 Terumo Glidewire into the aortic arch. The catheter  was placed into the left common carotid artery and then advanced into the left internal carotid artery. The diagnostic catheter was removed. Frontal and lateral angiograms of the head were obtained. FINDINGS: 1. Ultrasound showed heavily calcified right common femoral artery is maintained patency and caliber. 2. Proximal occlusion of a left M2/MCA anterior division branch. 3. Atherosclerotic changes of the intracranial left ICA with mild stenosis at the distal cavernous segment. 4. 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 PROCEDURE: Using biplane roadmap guidance, a Red 62 aspiration catheter was navigated over Colossus 35 microguidewire into the cavernous segment of the left ICA. The aspiration catheter was then advanced to the level of occlusion and connected to an aspiration pump. Continuous aspiration was performed  for 2 minutes. The guide catheter was connected to a VacLok syringe and the guiding catheter balloon was inflated. The aspiration catheter was subsequently removed under constant aspiration. The guide catheter was aspirated for debris. Left internal carotid artery angiograms with frontal and lateral views of the head showed complete recanalization of the left MCA vascular tree. The guide catheter was retracted into the neck. Frontal and lateral angiograms of the neck were obtained. Improvement of the degree of stenosis in the carotid bulb compared to prior CT angiogram. There is prominent luminal irregularity at the carotid bulb residual moderate stenosis. Increased tortuosity of the proximal/mid cervical left ICA with kinking. Left internal carotid artery angiograms with left anterior oblique views of the neck showed evidence of filling defects at the level of the carotid bulb. Flat panel CT of the head was obtained and post processed in a separate workstation with concurrent attending physician supervision. Selected images were sent to PACS. No evidence of hemorrhagic complication. There is mild contrast staining of the left insular and frontal cortex. Repeat left internal carotid artery angiograms with frontal and lateral views of the head showed Amy seen left M3/MCA branch to the left parietal region. Left common carotid artery angiograms with frontal and lateral views of the neck showed vertebra Gretchen of stenosis at the left ICA bulb with more prominent filling defect. At this point, patient was loaded on cangrelor  followed by continuous drip. Using biplane roadmap guidance, a 4-7 mm Emboshield NAV6 cerebral protection device was advanced into the cervical left ICA. However, multiple attempts to advance a cerebral protection device through the left ICA kinking proved unsuccessful. The cerebral protection device was subsequently removed. Using biplane roadmap guidance, a 10-8 x 40 mm XACT carotid stent was  navigated and deployed from the distal left common carotid artery to the proximal left internal carotid artery, proximal to the vessel kinking. Suboptimal stent expansion was noted. Then, a 6 x 30 mm Viatrac balloon was navigated into the recently deployed stent. Angioplasty was performed under fluoroscopy. Left internal carotid artery angiograms with frontal and lateral views of the neck showed adequate stent positioning and expansion with brisk anterograde flow. Left internal carotid artery angiograms with frontal and lateral views of the head showed improvement of anterograde flow in the left MCA vascular tree with brisk anterograde flow. Delayed left common carotid artery angiograms with frontal and lateral views of the neck showed no evidence of clot formation within the stent. The catheter was subsequently Riddle. Right common femoral artery angiogram was obtained in right anterior oblique view. The puncture is at the level of the common femoral artery. The artery has normal caliber, adequate for closure device.  The sheath was exchanged over the wire for an 8 French Angio-Seal which was utilized for access closure. Immediate hemostasis was achieved. IMPRESSION: 1. Successful mechanical thrombectomy for treatment of a proximal left M2/MCA anterior division branch occlusion achieving complete recanalization (TICI 3). 2. 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 PLAN: Continue cangrelor  infusion until patient is transitioned to oral dual antiplatelet therapy. Electronically Signed   By: Katyucia  de Macedo Rodrigues M.D.   On: 06/05/2023 13:16   CT ANGIO HEAD NECK W WO CM W PERF (CODE STROKE) Addendum Date: 06/05/2023 ADDENDUM REPORT: 06/05/2023 12:25 ADDENDUM: Please note, there is a dictation error within CTA neck impression #1, which should read: The common carotid  and internal carotid arteries are patent within the neck. Atherosclerotic plaque bilaterally. Most notably, there is progressive atherosclerotic plaque about the left carotid bifurcation and within the proximal left ICA with resultant severe near occlusive stenosis of the proximal left ICA. Also of note, atherosclerotic plaque about the right carotid bifurcation results in a 40% stenosis at the right ICA origin. Electronically Signed   By: Rockey Childs D.O.   On: 06/05/2023 12:25   Result Date: 06/05/2023 CLINICAL DATA:  Provided history: Cerebrovascular accident, unspecified mechanism. Right-sided weakness. Right-sided facial droop. Altered mental status. EXAM: CT ANGIOGRAPHY HEAD AND NECK CT PERFUSION BRAIN TECHNIQUE: Multidetector CT imaging of the head and neck was performed using the standard protocol during bolus administration of intravenous contrast. Multiplanar CT image reconstructions and MIPs were obtained to evaluate the vascular anatomy. Carotid stenosis measurements (when applicable) are obtained utilizing NASCET criteria, using the distal internal carotid diameter as the denominator. Multiphase CT imaging of the brain was performed following IV bolus contrast injection. Subsequent parametric perfusion maps were calculated using RAPID software. RADIATION DOSE REDUCTION: This exam was performed according to the departmental dose-optimization program which includes automated exposure control, adjustment of the mA and/or kV according to patient size and/or use of iterative reconstruction technique. CONTRAST:  OMNIPAQUE  IOHEXOL  350 MG/ML SOLN COMPARISON:  Noncontrast head CT performed earlier today 06/05/2023. MRA head and MRA neck 10/04/2019. FINDINGS: CTA NECK FINDINGS Aortic arch: Common origin of the innominate and left common carotid arteries. Atherosclerotic plaque within the visualized thoracic aorta and proximal major branch vessels of the neck. Streak/beam hardening artifact arising from a  dense right-sided contrast bolus partially obscures the right subclavian artery. Within this limitation, there is no appreciable hemodynamically significant innominate or proximal subclavian artery stenosis. Right carotid system: CCA and ICA patent within the neck. Atherosclerotic plaque, greatest about the carotid bifurcation. Resultant 40% stenosis at the ICA origin. Left carotid system: CCA and ICA patent within the neck. Atherosclerotic plaque. Most notably, there is prominent atherosclerotic plaque about the carotid bifurcation and within the proximal ICA which has progressed from the prior MRA neck of 10/04/2019. Resultant severe (near occlusive) stenosis of the proximal ICA. Tortuosity of the cervical ICA Vertebral arteries: The vertebral arteries are patent within the neck. Streak/beam hardening artifact limits evaluation of the right vertebral artery origin. At least moderate stenosis is suspected at this site. Atherosclerotic plaque scattered elsewhere within the cervical right vertebral artery with no more than mild stenosis. Calcified atherosclerotic plaque at the left vertebral artery origin with suspected at least moderate stenosis. Nonstenotic calcified plaque elsewhere within the cervical left vertebral artery. Skeleton: Cervical spondylosis. Other neck: No neck mass or cervical lymphadenopathy. Upper chest: No  consolidation within the imaged lung apices. Review of the MIP images confirms the above findings CTA HEAD FINDINGS Anterior circulation: The intracranial internal carotid arteries are patent. As sclerotic plaque within both vessels. No more than mild stenosis on the right. Up to moderate stenosis within the left cavernous segment. The M1 middle cerebral arteries are patent. Abrupt occlusion of a proximal M2 left middle cerebral artery vessel (series 11, image 22). Atherosclerotic irregularity of the M2 and more distal MCA vessels elsewhere. The anterior cerebral arteries are patent.  Atherosclerotic irregularity of both vessels without high-grade proximal stenosis. A possible 2 mm periophthalmic left ICA aneurysm with better appreciated on the prior MRA head of 10/04/2019. Posterior circulation: The intracranial vertebral arteries are patent. Atherosclerotic plaque within the right V4 segment sites of mild stenosis. Non-stenotic atherosclerotic plaque within the left V4 segment. The basilar artery is patent. The posterior cerebral arteries are patent. Posterior communicating arteries are diminutive or absent, bilaterally. Venous sinuses: Assessment for dural venous sinus thrombosis is limited due to contrast timing. Anatomic variants: As described. Review of the MIP images confirms the above findings CT Brain Perfusion Findings: ASPECTS: CBF (<30%) Volume: 41mL Perfusion (Tmax>6.0s) volume: 86mL Mismatch Volume: 45mL Infarction Location:Left MCA vascular territory CTA head impression #1, the CT perfusion head impression and the presence of a severe stenosis of the proximal cervical left ICA called by telephone at the time of interpretation on 06/05/2023 at 8:40 am to provider [facility]IC [facility] , who verbally acknowledged these results. IMPRESSION: CTA neck: 1. No common carotid and internal carotid arteries are patent within the neck. Atherosclerotic plaque bilaterally. Most notably, there is progressive atherosclerotic plaque about the left carotid bifurcation and within the proximal left ICA with resultant severe, near occlusive stenosis of the proximal left ICA. Also of note, atherosclerotic plaque about the right carotid bifurcation results in 40% stenosis at the right ICA origin. 2. The vertebral arteries are patent within the neck. Atherosclerotic plaque bilaterally as described. Most notably, there is suspected at least moderate stenoses at the bilateral vertebral artery origins. 3. Aortic Atherosclerosis (ICD10-I70.0). CTA head: 1. Abrupt occlusion of a proximal M2 left middle cerebral artery  vessel. 2. Background intracranial atherosclerotic disease as described. 3. A possible 2 mm periophthalmic left ICA aneurysm was better appreciated on the prior MRA head of 10/04/2019. CT perfusion head: The perfusion software identifies a 41 mL core infarct in the left MCA vascular territory. The perfusion software identifies an 86 mL region of critically hypoperfused parenchyma within the left MCA vascular territory (utilizing the Tmax>6 seconds threshold). Reported mismatch volume: 45 mL Electronically Signed: By: Rockey Childs D.O. On: 06/05/2023 09:07   CT HEAD CODE STROKE WO CONTRAST Result Date: 06/05/2023 CLINICAL DATA:  Code stroke. Neuro deficit, acute, stroke suspected. EXAM: CT HEAD WITHOUT CONTRAST TECHNIQUE: Contiguous axial images were obtained from the base of the skull through the vertex without intravenous contrast. RADIATION DOSE REDUCTION: This exam was performed according to the departmental dose-optimization program which includes automated exposure control, adjustment of the mA and/or kV according to patient size and/or use of iterative reconstruction technique. COMPARISON:  Brain MRI 10/04/2019.  Noncontrast head CT 10/03/2019. FINDINGS: Brain: Generalized cerebral atrophy. Loss of gray-white differentiation consistent with an acute infarct within the left insula and within portions of the left frontal operculum (MCA vascular territory). Known small chronic cortically-based infarcts within the left frontal, left parietal and left occipital lobes were better appreciated on the prior brain MRI of 10/04/2019 (acute at that time). Mild  patchy and ill-defined hypoattenuation within the cerebral white matter, nonspecific but compatible with chronic small vessel ischemic disease. Subcentimeter infarct within the superior right cerebellar hemisphere, new from the prior MRI but chronic in appearance. Loss of gray-white differentiation there is no acute intracranial hemorrhage. No extra-axial fluid  collection. No evidence of an intracranial mass. No midline shift. Vascular: No hyperdense vessel.  Atherosclerotic calcifications. Skull: No calvarial fracture or aggressive osseous lesion. Sinuses/Orbits: No mass or acute finding within the imaged orbits. No significant paranasal sinus disease. ASPECTS [facility]xx Stroke Program Early CT Score) - Ganglionic level infarction (caudate, lentiform nuclei, internal capsule, insula, M1-M3 cortex): 5 - Supraganglionic infarction (M4-M6 cortex): 2 Total score (0-10 with 10 being normal): 7 Impression #1 called by telephone at the time of interpretation on 06/05/2023 at 8:40 am to provider Dr. Merrianne, who verbally acknowledged these results. IMPRESSION: 1. Acute left MCA territory infarct affecting the left insula and portions of the left frontal operculum. ASPECTS is 7. 2. Known small chronic cortically-based infarcts within the left frontal, left parietal and left occipital lobes were better appreciated on the prior brain MRI of 10/04/2019 (acute at that time). 3. Background mild cerebral white matter chronic small vessel ischemic disease. 4. Subcentimeter infarct within the right cerebellar hemisphere, new from prior MRI but chronic in appearance. 5. Generalized cerebral atrophy. Electronically Signed   By: Rockey Childs D.O.   On: 06/05/2023 08:45     PHYSICAL EXAM  Temp:  [98.6 F (37 C)-99.8 F (37.7 C)] 99.7 F (37.6 C) (02/08 0421) Pulse Rate:  [63-77] 70 (02/08 0421) Resp:  [17-26] 20 (02/08 0421) BP: (103-149)/(55-101) 145/63 (02/08 0421) SpO2:  [91 %-100 %] 100 % (02/08 0421)  General - obese, well developed, in no apparent distress.  Cardiovascular - Regular rhythm and rate.  Neuro - awake, alert, eyes open, orientated to age, place only.    Severe dysarthria, but following simple commands. Perseveration of speech.  No gaze palsy, tracking bilaterally, blinking to visual threat bilaterally.  Right facial droop. Tongue midline Bilateral UEs  4/5, minimal right drift.  Bilaterally LEs 3/5.  Sensation symmetric to light touch. No gross ataxia  gait not tested.    ASSESSMENT/PLAN Mr. Kapena Hamme. is a 83 y.o. male with history of hypertension, diabetes, CKD 3, stroke admitted for right-sided weakness numbness, aphasia, right facial droop, left gaze preference. No TNK given due to outside window.    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 status post IR with TICI3 and confluent HT, likely secondary to large vessel disease source versus cardiomyopathy with low EF CT left MCA infarct CT head and neck left M2 occlusion, left ICA near occlusion, right ICA 43 stenosis, bilateral VA origin severe stenosis CTP 41/86 Status post IR with TICI3 and left ICA stenting MRI left MCA infarct at left insular and left frontal operculum, prominent and confluent petechial hemorrhagic transformation CT repeat 2/7 stable confluent petechial hemorrhage 2D Echo EF 30% LDL 107 HgbA1c 6.1 UDS negative Heparin  subcu for VTE prophylaxis aspirin  81 mg daily and clopidogrel  75 mg daily prior to admission, now on aspirin  and Plavix  post stenting. Patient will be counseled to be compliant with his antithrombotic medications Ongoing aggressive stroke risk factor management Therapy recommendations:  CIR vs SNF Disposition: Pending  History of stroke 10/08/2019 admitted for left MCA infarct due to right upper extremity weakness.  MRA head and neck showed left ICA 30% stenosis.  EF 45 to 50%.  LDL 105, A1c 5.6.  Recommended loop recorder at that time but only got 30-day CardioNet monitoring which was no A-fib.  Discharged on DAPT and Lipitor  80.  Cardiomyopathy CHF 09/2019 EF 45 to 50% Current admission EF 30% Cardiology on board, appreciate Dr. Mona assistance On DAPT Agree with GDMT - would like to start losartan , entresto, and spironolactone  as BP and Cr tolerates Metoprolol  succinate 25mg  daily Lasix  80mg   x1 May consider loop recorder placement to rule out A-fib  Diabetes HgbA1c 6.1 goal < 7.0 Controlled CBG monitoring SSI.  Add Semglee  3 units daily and continue to monitor DM education and close PCP follow up  Hypertension Stable BP goal less than 160 given hemorrhagic transformation Long term BP goal normotensive  Hyperlipidemia Home meds: Lipitor  80 LDL 105, goal < 70 Now on lipitor  80 and Zetia  Continue statin and at discharge  Dysphagia Poststroke dysphagia Speech on board Dysphagia 1 (Pureed) honey thick liquid On core track With tube feeding at 64  Other Stroke Risk Factors Advanced age Obesity, Body mass index is 41.94 kg/m.   Other Active Problems AKI on CKD3b, creatinine 1.60--1.66 -> 1.98 Hemoglobin 9.3 today dropped from 11 continue to monitor  Hospital day # 3    Total of 35 mins spent reviewing chart, discussion with patient and family on prognosis, Dx and plan. Discussed case with patient's nurse. Reviewed Imaging personally.   To contact Stroke Continuity provider, please refer to [email or web address]. After hours, contact General Neurology

## 2023-06-08 NOTE — Plan of Care (Signed)
   Problem: Education: Goal: Knowledge of disease or condition will improve Outcome: Progressing

## 2023-06-08 NOTE — Progress Notes (Addendum)
 Progress Note  Patient Name: Nathan Proctor. Date of Encounter: 06/08/2023  Primary Cardiologist: None  Subjective   No acute events overnight.  No symptoms.  Inpatient Medications    Scheduled Meds:  aspirin   81 mg Per Tube Daily   atorvastatin   80 mg Per Tube Daily   Chlorhexidine  Gluconate Cloth  6 each Topical Daily   clopidogrel   75 mg Per Tube Daily   ezetimibe   10 mg Per Tube Daily   feeding supplement (PROSource TF20)  60 mL Per Tube Daily   heparin  injection (subcutaneous)  5,000 Units Subcutaneous Q8H   insulin  aspart  0-9 Units Subcutaneous Q4H   insulin  glargine-yfgn  3 Units Subcutaneous Daily   metoprolol  tartrate  12.5 mg Per Tube BID   sodium chloride  flush  3 mL Intravenous Once   Continuous Infusions:  feeding supplement (OSMOLITE 1.5 CAL) 65 mL/hr at 06/07/23 2000   PRN Meds: acetaminophen  **OR** acetaminophen  (TYLENOL ) oral liquid 160 mg/5 mL **OR** acetaminophen , hydrALAZINE , ipratropium-albuterol , labetalol , senna-docusate   Vital Signs    Vitals:   06/07/23 2000 06/07/23 2054 06/07/23 2354 06/08/23 0421  BP: (!) 140/59 138/64 (!) 103/56 (!) 145/63  Pulse: 74 69 77 70  Resp: (!) 24 19 20 20   Temp: 99 F (37.2 C) 99.8 F (37.7 C) 99.8 F (37.7 C) 99.7 F (37.6 C)  TempSrc: Oral Oral Oral Oral  SpO2: 96% 100% 97% 100%  Weight:        Intake/Output Summary (Last 24 hours) at 06/08/2023 1314 Last data filed at 06/08/2023 0600 Gross per 24 hour  Intake 395.33 ml  Output 1300 ml  Net -904.67 ml   Filed Weights   06/05/23 0800  Weight: 132.6 kg    Telemetry     Personally reviewed.  NSR.  ECG    Not performed today  Physical Exam   GEN: No acute distress.   Neck: No JVD. Cardiac: RRR, no murmur, rub, or gallop.  Respiratory: Nonlabored.  Rales present. GI: Soft, nontender, bowel sounds present. MS: No edema; No deformity. Psych: Alert and oriented x 3. Normal affect.  Labs    Chemistry Recent Labs  Lab  06/05/23 0815 06/05/23 0817 06/06/23 0557 06/07/23 0413 06/08/23 0556  NA 142   < > 139 143 145  K 3.8   < > 5.0 4.2 4.2  CL 109   < > 110 110 112*  CO2 20*  --  16* 20* 25  GLUCOSE 203*   < > 161* 163* 283*  BUN 18   < > 23 31* 42*  CREATININE 1.59*   < > 1.66* 1.98* 2.03*  CALCIUM  9.4  --  8.7* 9.5 8.8*  PROT 6.5  --  6.1*  --   --   ALBUMIN  3.6  --  3.3*  --   --   AST 20  --  26  --   --   ALT 13  --  12  --   --   ALKPHOS 76  --  72  --   --   BILITOT 1.2  --  1.1  --   --   GFRNONAA 43*  --  41* 33* 32*  ANIONGAP 13  --  13 13 8    < > = values in this interval not displayed.     Hematology Recent Labs  Lab 06/06/23 0557 06/07/23 0413 06/08/23 0556  WBC 10.0 8.4 7.4  RBC 3.72* 3.81* 3.06*  HGB 11.4* 11.8* 9.3*  HCT 36.3* 37.1* 29.6*  MCV 97.6 97.4 96.7  MCH 30.6 31.0 30.4  MCHC 31.4 31.8 31.4  RDW 13.8 13.9 13.8  PLT 177 151 140*    Cardiac EnzymesNo results for input(s): TROPONINIHS in the last 720 hours.  BNP Recent Labs  Lab 06/08/23 0556  BNP 1,006.3*     DDimerNo results for input(s): DDIMER in the last 168 hours.   Radiology    DG Swallowing Func-Speech Pathology Result Date: 06/07/2023 Table formatting from the original result was not included. Modified Barium Swallow Study Study completed and documented by Waddell Music, SLP Student Supervised and reviewed by Consuelo Fort, MA CCC-SLP [facility] Secure Chat Preferred Office [phone] Patient Details Name: Nathan Proctor. MRN: [id number] Date of Birth: 06-20-1940 Today's Date: 06/07/2023 HPI/PMH: HPI: Pt is a 83 y.o. male who was admitted as a code stroke on 02/05 due to acute onset of right-sided weakness, right facial droop and aphasia. MRI from 02/06 displayed an evolving acute left MCA infarct, most pronounced at the left insula and left frontal operculum. Pt has intentionally lost 300 pounds in the recent past, with pre-loss weight at ~600Ib. PMHx includes  hypertension, diabetes, CKD, prior CVA. Clinical Impression: Clinical Impression: Pt presented with a moderate-severe oropharyngeal dysphagia (DIGEST Score: 3) primarily characterized by silent aspiration of thin liquid, silent penetration of nectar-thick liquid, diffuse pharyngeal residue collection during thin/nectar-thick consistencies, and posterior-escape of bolus across multiple consistencies. Pt positioning was a potential limitation for examination due to consistent posterior head position. More anterior head positioning during nectar-thick liquid displayed less pharyngeal residue, increased hyo-laryngeal excursion, and overall better airway safety. This positioning was not present throughout due to pt's receptive language deficits to understand cues. Alternating between honey-thick and puree consistencies provided more oropharyngeal bolus clearance and no signs of aspiration. Recommend a dysphagia 1 (puree) and honey-thick liquid diet to optimize pt's airway safety and potential for meeting nutritional needs. Slow rate, small bites/sips, natural head posturing, and multiple swallows are compensatory strategies that displayed best efficiency with pt. F/u with SLP for family education, compensatory strategy training, and upgraded diet trials is recommended. Factors that may increase risk of adverse event in presence of aspiration Noe & Lianne 2021): Factors that may increase risk of adverse event in presence of aspiration Noe & Lianne 2021): Weak cough Recommendations/Plan: Swallowing Evaluation Recommendations Swallowing Evaluation Recommendations Recommendations: PO diet PO Diet Recommendation: Dysphagia 1 (Pureed); Moderately thick liquids (Level 3, honey thick) Liquid Administration via: Spoon Medication Administration: Crushed with puree Supervision: Full supervision/cueing for swallowing strategies; Full assist for feeding Swallowing strategies  : Slow rate; Small bites/sips; Multiple dry  swallows after each bite/sip (Pt needs to be in a neutral head position (no posterior head tilt).) Postural changes: Position pt fully upright for meals; Stay upright 30-60 min after meals Oral care recommendations: Oral care BID (2x/day); Staff/trained caregiver to provide oral care Caregiver Recommendations: Have oral suction available Treatment Plan Treatment Plan Treatment recommendations: Therapy as outlined in treatment plan below Follow-up recommendations: Follow physicians's recommendations for discharge plan and follow up therapies Functional status assessment: Patient has had a recent decline in their functional status and demonstrates the ability to make significant improvements in function in a reasonable and predictable amount of time. Treatment frequency: Min 2x/week Treatment duration: 2 weeks Interventions: Aspiration precaution training; Compensatory techniques; Patient/family education; Diet toleration management by SLP; Trials of upgraded texture/liquids Recommendations Recommendations for follow up therapy are one component of a multi-disciplinary discharge planning process, led  by the attending physician.  Recommendations may be updated based on patient status, additional functional criteria and insurance authorization. Assessment: Orofacial Exam: Orofacial Exam Oral Cavity - Dentition: Adequate natural dentition Orofacial Anatomy: WFL Anatomy: Anatomy: WFL Boluses Administered: Boluses Administered Boluses Administered: Thin liquids (Level 0); Mildly thick liquids (Level 2, nectar thick); Moderately thick liquids (Level 3, honey thick); Puree; Solid  Oral Impairment Domain: Oral Impairment Domain Lip Closure: Escape progressing to mid-chin Tongue control during bolus hold: Posterior escape of greater than half of bolus Bolus preparation/mastication: -- (Pt did not chew solid and SLP had to remove it from mouth.) Bolus transport/lingual motion: Repetitive/disorganized tongue motion Oral residue:  Residue collection on oral structures Location of oral residue : Tongue; Palate Initiation of pharyngeal swallow : Pyriform sinuses  Pharyngeal Impairment Domain: Pharyngeal Impairment Domain Soft palate elevation: No bolus between soft palate (SP)/pharyngeal wall (PW) Laryngeal elevation: Partial superior movement of thyroid  cartilage/partial approximation of arytenoids to epiglottic petiole Anterior hyoid excursion: Partial anterior movement Epiglottic movement: Partial inversion Laryngeal vestibule closure: Incomplete, narrow column air/contrast in laryngeal vestibule Pharyngeal stripping wave : Present - complete Pharyngeal contraction (A/P view only): N/A Pharyngoesophageal segment opening: Complete distension and complete duration, no obstruction of flow Tongue base retraction: Narrow column of contrast or air between tongue base and PPW Pharyngeal residue: Collection of residue within or on pharyngeal structures Location of pharyngeal residue: Diffuse (>3 areas)  Esophageal Impairment Domain: Esophageal Impairment Domain Esophageal clearance upright position: -- (Not tested.) Pill: No data recorded Penetration/Aspiration Scale Score: Penetration/Aspiration Scale Score 1.  Material does not enter airway: Puree; Moderately thick liquids (Level 3, honey thick) 3.  Material enters airway, remains ABOVE vocal cords and not ejected out: Mildly thick liquids (Level 2, nectar thick) 8.  Material enters airway, passes BELOW cords without attempt by patient to eject out (silent aspiration) : Thin liquids (Level 0) Compensatory Strategies: Compensatory Strategies Compensatory strategies: Yes Straw: Ineffective Multiple swallows: Effective (Required max cueing due to pt's language.) Effective Multiple Swallows: Puree; Moderately thick liquid (Level 3, honey thick); Mildly thick liquid (Level 2, nectar thick)   General Information: Caregiver present: No  Diet Prior to this Study: NPO   Temperature : Normal   Respiratory  Status: WFL   Supplemental O2: None (Room air)   No data recorded Behavior/Cognition: Alert; Cooperative; Pleasant mood; Requires cueing Self-Feeding Abilities: Needs assist with self-feeding Baseline vocal quality/speech: Dysphonic Volitional Cough: Unable to elicit Volitional Swallow: Able to elicit Exam Limitations: Poor positioning Goal Planning: Prognosis for improved oropharyngeal function: Good Barriers to Reach Goals: Language deficits No data recorded No data recorded Consulted and agree with results and recommendations: Pt unable/family or caregiver not available Pain: Pain Assessment Pain Assessment: No/denies pain Faces Pain Scale: 0 Facial Expression: 0 Body Movements: 0 Muscle Tension: 0 Compliance with ventilator (intubated pts.): N/A Vocalization (extubated pts.): 0 CPOT Total: 0 End of Session: Start Time:SLP Start Time (ACUTE ONLY): 0903 Stop Time: SLP Stop Time (ACUTE ONLY): 0930 Time Calculation:SLP Time Calculation (min) (ACUTE ONLY): 27 min Charges: SLP Evaluations $ SLP Speech Visit: 1 Visit SLP Evaluations $BSS Swallow: 1 Procedure $MBS Swallow: 1 Procedure $ SLP EVAL LANGUAGE/SOUND PRODUCTION: 1 Procedure $Swallowing Treatment: 1 Procedure SLP visit diagnosis: SLP Visit Diagnosis: Dysphagia, oropharyngeal phase (R13.12) Past Medical History: Past Medical History: Diagnosis Date  Anemia of chronic disease   Ankle fracture 02/15/2016  Cancer (HCC)   Prostate  Chronic constipation   Chronic kidney disease   stage III  Diabetes mellitus without  complication (HCC)   type II   Diabetic peripheral neuropathy (HCC)   Failure to thrive (0-17)   Fracture of left lower leg   Gout   Hyperlipidemia   Hypertension   Morbid obesity (HCC)   Unstable gait  Past Surgical History: Past Surgical History: Procedure Laterality Date  IR CT HEAD LTD  06/05/2023  IR INTRAVSC STENT CERV CAROTID W/O EMB-PROT MOD SED INC ANGIO  06/05/2023  IR PERCUTANEOUS ART THROMBECTOMY/INFUSION INTRACRANIAL INC DIAG ANGIO  06/05/2023  IR  US  GUIDE VASC ACCESS RIGHT  06/05/2023  ORIF ANKLE FRACTURE Left 02/29/2016  Procedure: OPEN REDUCTION INTERNAL FIXATION (ORIF) ANKLE FRACTURE;  Surgeon: Selinda Belvie Gosling, MD;  Location: [facility];  Service: Orthopedics;  Laterality: Left;  PROSTATE SURGERY    RADIOLOGY WITH ANESTHESIA N/A 06/05/2023  Procedure: IR WITH ANESTHESIA;  Surgeon: Radiologist, Medication, MD;  Location: [facility];  Service: Radiology;  Laterality: N/A; DeBlois, Consuelo Fitch 06/07/2023, 12:15 PM  CT HEAD WO CONTRAST ( ) Result Date: 06/07/2023 CLINICAL DATA:  83 year old male status post code stroke presentation, left MCA M2 occlusion, with left MCA infarct with confluent petechial hemorrhage. EXAM: CT HEAD WITHOUT CONTRAST TECHNIQUE: Contiguous axial images were obtained from the base of the skull through the vertex without intravenous contrast. RADIATION DOSE REDUCTION: This exam was performed according to the departmental dose-optimization program which includes automated exposure control, adjustment of the mA and/or kV according to patient size and/or use of iterative reconstruction technique. COMPARISON:  Brain MRI yesterday.  Head CT 06/05/2023. FINDINGS: Brain: Confluent mixed density infarct in the left MCA middle division, epicenter at the insula and operculum. Confluent petechial hemorrhage on series 3, image 16 appears stable from the MRI. Superimposed trace subarachnoid blood is difficult to exclude by CT (series 3, image 13), but was not apparent on MRI. Stable mild mass effect on the left lateral ventricle with only trace rightward midline shift (series 3, image 16). No ventriculomegaly. Elsewhere gray-white differentiation is stable from the presentation CT. Basilar cisterns remain patent. Vascular: Resolved hyperdense left MCA since presentation. Calcified atherosclerosis at the skull base. Skull: Stable, intact. Sinuses/Orbits: Visualized paranasal sinuses and mastoids are stable and well aerated. Other: Left nasoenteric tube  now in place. Stable orbit and scalp soft tissues. IMPRESSION: 1. Stable by CT confluent Left MCA middle division infarct with petechial hemorrhage (Heidelberg classification 1b: HI2, confluent petechiae, no mass effect). Questionable trace superimposed SAH, but was not apparent on MRI. 2. Stable minimal intracranial mass effect. 3. No new intracranial abnormality. Electronically Signed   By: VEAR Hurst M.D.   On: 06/07/2023 05:37    Assessment & Plan   Acute on chronic systolic heart failure AKI on CKD stage IIIa Cardiomyopathy, unclear etiology (LVEF 30% down from 45% in 2021) Left MCA stroke s/p thrombectomy HTN, DM 2, HLD   -Serum creatinine increased from 1.5 on admission to 2.  Received IV Lasix  80 mg yesterday, serum creatinine stable after receiving IV Lasix .  Difficult to assess volume status.  Will obtain chest x-ray.  Will hold further diuresis until serum creatinine returns to baseline.  Continue metoprolol  to tartrate 12.5 mg twice daily.  Once AKI resolves, will start GDMT.  He will benefit from outpatient ischemia evaluation. -Continue cardioprotective medications, aspirin  81 mg once daily, Plavix  75 mg once daily, atorvastatin  80 mg nightly and Zetia  10 mg once daily.  Currently has feeding tube.  I spoke with the wife and updated the plan.   Signed, Diannah SHAUNNA Maywood, MD  06/08/2023,  1:14 PM

## 2023-06-09 DIAGNOSIS — I639 Cerebral infarction, unspecified: Secondary | ICD-10-CM | POA: Diagnosis not present

## 2023-06-09 DIAGNOSIS — I5021 Acute systolic (congestive) heart failure: Secondary | ICD-10-CM | POA: Diagnosis not present

## 2023-06-09 DIAGNOSIS — N179 Acute kidney failure, unspecified: Secondary | ICD-10-CM | POA: Diagnosis not present

## 2023-06-09 LAB — GLUCOSE, CAPILLARY
Glucose-Capillary: 247 mg/dL — ABNORMAL HIGH (ref 70–99)
Glucose-Capillary: 284 mg/dL — ABNORMAL HIGH (ref 70–99)
Glucose-Capillary: 345 mg/dL — ABNORMAL HIGH (ref 70–99)
Glucose-Capillary: 254 mg/dL — ABNORMAL HIGH (ref 70–99)
Glucose-Capillary: 237 mg/dL — ABNORMAL HIGH (ref 70–99)
Glucose-Capillary: 212 mg/dL — ABNORMAL HIGH (ref 70–99)

## 2023-06-09 LAB — BASIC METABOLIC PANEL
Anion gap: 6 (ref 5–15)
BUN: 42 mg/dL — ABNORMAL HIGH (ref 8–23)
CO2: 25 mmol/L (ref 22–32)
Calcium: 9 mg/dL (ref 8.9–10.3)
Chloride: 113 mmol/L — ABNORMAL HIGH (ref 98–111)
Creatinine, Ser: 1.87 mg/dL — ABNORMAL HIGH (ref 0.61–1.24)
GFR, Estimated: 35 mL/min — ABNORMAL LOW (ref >60)
Glucose, Bld: 290 mg/dL — ABNORMAL HIGH (ref 70–99)
Potassium: 4.2 mmol/L (ref 3.5–5.1)
Sodium: 144 mmol/L (ref 135–145)

## 2023-06-09 LAB — CBC
HCT: 31.2 % — ABNORMAL LOW (ref 39.0–52.0)
Hemoglobin: 9.7 g/dL — ABNORMAL LOW (ref 13.0–17.0)
MCH: 30.4 pg (ref 26.0–34.0)
MCHC: 31.1 g/dL (ref 30.0–36.0)
MCV: 97.8 fL (ref 80.0–100.0)
Platelets: 144 10*3/uL — ABNORMAL LOW (ref 150–400)
RBC: 3.19 MIL/uL — ABNORMAL LOW (ref 4.22–5.81)
RDW: 13.4 % (ref 11.5–15.5)
WBC: 7.8 10*3/uL (ref 4.0–10.5)
nRBC: 0 % (ref 0.0–0.2)

## 2023-06-09 MED ORDER — SPIRONOLACTONE 25 MG PO TABS
25.0000 mg | ORAL_TABLET | Freq: Every day | ORAL | Status: DC
  Administered 2023-06-09 – 2023-06-14 (×6): 25 mg via ORAL
  Filled 2023-06-09 (×7): qty 1

## 2023-06-09 MED ORDER — FUROSEMIDE 10 MG/ML IJ SOLN
20.0000 mg | Freq: Two times a day (BID) | INTRAMUSCULAR | Status: DC
  Administered 2023-06-09 – 2023-06-10 (×4): 20 mg via INTRAVENOUS
  Filled 2023-06-09 (×4): qty 4

## 2023-06-09 MED ORDER — POLYETHYLENE GLYCOL 3350 17 G PO PACK
17.0000 g | PACK | Freq: Every day | ORAL | Status: DC | PRN
  Administered 2023-06-09: 17 g via ORAL
  Filled 2023-06-09 (×2): qty 1

## 2023-06-09 NOTE — Plan of Care (Signed)
 Overnight on-call neurology note I was called at about 9 PM regarding concern that the core track may be not in the right position. Abdominal x-ray done Report pending-personally reviewed-looks similar in placement. Okay to continue to use. -- Eligio Lav, MD Neurology

## 2023-06-09 NOTE — Progress Notes (Signed)
 Progress Note  Patient Name: Nathan Proctor. Date of Encounter: 06/09/2023  Primary Cardiologist: None  Subjective   No acute events overnight.  No symptoms.  Chest x-ray yesterday showed pulmonary vascular congestion.  Inpatient Medications    Scheduled Meds:  aspirin   81 mg Per Tube Daily   atorvastatin   80 mg Per Tube Daily   Chlorhexidine  Gluconate Cloth  6 each Topical Daily   clopidogrel   75 mg Per Tube Daily   ezetimibe   10 mg Per Tube Daily   furosemide   20 mg Intravenous BID   heparin  injection (subcutaneous)  5,000 Units Subcutaneous Q8H   insulin  aspart  0-9 Units Subcutaneous Q4H   insulin  glargine-yfgn  3 Units Subcutaneous Daily   metoprolol  tartrate  12.5 mg Per Tube BID   sodium chloride  flush  3 mL Intravenous Once   Continuous Infusions:   PRN Meds: acetaminophen  **OR** acetaminophen  (TYLENOL ) oral liquid 160 mg/5 mL **OR** acetaminophen , hydrALAZINE , ipratropium-albuterol , labetalol , senna-docusate   Vital Signs    Vitals:   06/08/23 2348 06/09/23 0456 06/09/23 0738 06/09/23 1153  BP: (!) 150/70 (!) 157/70 (!) 147/62 124/74  Pulse: 70 81 71 96  Resp: 18  20 18   Temp: 100.2 F (37.9 C) 98.4 F (36.9 C) 98.3 F (36.8 C) 98.5 F (36.9 C)  TempSrc: Axillary Axillary  Oral  SpO2: 98% 97% 96% 95%  Weight:        Intake/Output Summary (Last 24 hours) at 06/09/2023 1243 Last data filed at 06/09/2023 0939 Gross per 24 hour  Intake 2243.92 ml  Output 1650 ml  Net 593.92 ml   Filed Weights   06/05/23 0800  Weight: 132.6 kg    Telemetry     Personally reviewed.  NSR.  ECG    Not performed today  Physical Exam   GEN: No acute distress.   Neck: No JVD. Cardiac: RRR, no murmur, rub, or gallop.  Respiratory: Nonlabored.  Rales present. GI: Soft, nontender, bowel sounds present. MS: No edema; No deformity. Psych: Alert and oriented x 3. Normal affect.  Labs    Chemistry Recent Labs  Lab 06/05/23 0815 06/05/23 9182  06/06/23 0557 06/07/23 0413 06/08/23 0556 06/09/23 0720  NA 142   < > 139 143 145 144  K 3.8   < > 5.0 4.2 4.2 4.2  CL 109   < > 110 110 112* 113*  CO2 20*  --  16* 20* 25 25  GLUCOSE 203*   < > 161* 163* 283* 290*  BUN 18   < > 23 31* 42* 42*  CREATININE 1.59*   < > 1.66* 1.98* 2.03* 1.87*  CALCIUM  9.4  --  8.7* 9.5 8.8* 9.0  PROT 6.5  --  6.1*  --   --   --   ALBUMIN  3.6  --  3.3*  --   --   --   AST 20  --  26  --   --   --   ALT 13  --  12  --   --   --   ALKPHOS 76  --  72  --   --   --   BILITOT 1.2  --  1.1  --   --   --   GFRNONAA 43*  --  41* 33* 32* 35*  ANIONGAP 13  --  13 13 8 6    < > = values in this interval not displayed.     Hematology Recent Labs  Lab  06/07/23 0413 06/08/23 0556 06/09/23 0720  WBC 8.4 7.4 7.8  RBC 3.81* 3.06* 3.19*  HGB 11.8* 9.3* 9.7*  HCT 37.1* 29.6* 31.2*  MCV 97.4 96.7 97.8  MCH 31.0 30.4 30.4  MCHC 31.8 31.4 31.1  RDW 13.9 13.8 13.4  PLT 151 140* 144*    Cardiac EnzymesNo results for input(s): TROPONINIHS in the last 720 hours.  BNP Recent Labs  Lab 06/08/23 0556  BNP 1,006.3*     DDimerNo results for input(s): DDIMER in the last 168 hours.   Radiology    DG Abd 1 View Result Date: 06/09/2023 CLINICAL DATA:  [id number]. Encounter for feeding tube placement. EXAM: ABDOMEN - 1 VIEW COMPARISON:  Abdomen film 06/05/2023. FINDINGS: Dobbhoff feeding tube is well placed with the radiopaque tip in the distal stomach. The visualized bowel pattern is nonobstructive. There is barium newly seen in the flexures and transverse colon. There is no supine evidence of free air. There is atelectasis in the lung bases. Cardiomegaly. IMPRESSION: 1. Dobbhoff feeding tube well placed with the radiopaque tip in the distal stomach. 2. Nonobstructive bowel gas pattern. 3. Cardiomegaly. Electronically Signed   By: Francis Quam M.D.   On: 06/09/2023 03:41   DG CHEST PORT 1 VIEW Result Date: 06/08/2023 CLINICAL DATA:  CHF EXAM: PORTABLE CHEST 1 VIEW  COMPARISON:  01/06/2023 FINDINGS: Feeding tube in place. There is cardiomegaly with vascular congestion. Chronic elevation of the right hemidiaphragm with right base atelectasis. Interstitial prominence throughout the lungs, right greater than left could reflect interstitial edema. Possible small effusions. No acute bony abnormality. IMPRESSION: Cardiomegaly with vascular congestion and probable mild interstitial edema. Question small bilateral effusions. Chronic elevation of the right hemidiaphragm with right base atelectasis. Electronically Signed   By: Franky Crease M.D.   On: 06/08/2023 18:20    Assessment & Plan   Acute on chronic systolic heart failure AKI on CKD stage IIIa Cardiomyopathy, unclear etiology (LVEF 30% down from 45% in 2021) Left MCA stroke s/p thrombectomy HTN, DM 2, HLD   -Serum creatinine increased from 1.5 on admission to 2 yesterday but after holding diuretics, serum creatinine improved.  Chest x-ray yesterday showed pulmonary vascular congestion, will start IV Lasix  20 mg twice daily.  Continue metoprolol  tartrate 12.5 mg twice daily.  He has feeding tube in.  Will start spironolactone  25 mg once daily (need to check with pharmacy if this can be crushed).  Outpatient ischemia evaluation. -Continue cardioprotective medications, aspirin  81 mg once daily, Plavix  and 5 mg once daily, atorvastatin  80 mg nightly and Zetia  10 mg once daily.    Signed, Diannah SHAUNNA Maywood, MD  06/09/2023, 12:43 PM

## 2023-06-09 NOTE — Plan of Care (Signed)
   Problem: Coping: Goal: Will verbalize positive feelings about self Outcome: Progressing

## 2023-06-09 NOTE — Plan of Care (Signed)
   Problem: Education: Goal: Knowledge of disease or condition will improve Outcome: Progressing

## 2023-06-09 NOTE — Progress Notes (Signed)
 STROKE TEAM PROGRESS NOTE   SUBJECTIVE (INTERVAL HISTORY)  Doing well.  No issues this morning.  Patient tells me he is eating all his food.  Discussed with patient's nurse and she confirmed you eating 100% of his meals.  Will discontinue core track.  Continue to monitor I's and O's  Continue to follow recommendations per cardiology for his heart failure they will consider loop monitor.  OBJECTIVE Temp:  [98.1 F (36.7 C)-100.2 F (37.9 C)] 98.5 F (36.9 C) (02/09 1153) Pulse Rate:  [70-96] 96 (02/09 1153) Cardiac Rhythm: Normal sinus rhythm (02/09 0701) Resp:  [18-20] 18 (02/09 1153) BP: (124-157)/(62-74) 124/74 (02/09 1153) SpO2:  [95 %-100 %] 95 % (02/09 1153)  Recent Labs  Lab 06/08/23 1950 06/08/23 2352 06/09/23 0458 06/09/23 0831 06/09/23 1208  GLUCAP 286* 229* 247* 284* 345*   Recent Labs  Lab 06/05/23 0815 06/05/23 0817 06/05/23 1500 06/05/23 1811 06/06/23 0557 06/06/23 1648 06/07/23 0413 06/08/23 0556 06/09/23 0720  NA 142 142  --   --  139  --  143 145 144  K 3.8 3.7  --   --  5.0  --  4.2 4.2 4.2  CL 109 108  --   --  110  --  110 112* 113*  CO2 20*  --   --   --  16*  --  20* 25 25  GLUCOSE 203* 201*  --   --  161*  --  163* 283* 290*  BUN 18 21  --   --  23  --  31* 42* 42*  CREATININE 1.59* 1.60*  --   --  1.66*  --  1.98* 2.03* 1.87*  CALCIUM  9.4  --   --   --  8.7*  --  9.5 8.8* 9.0  MG  --   --  1.8 1.9 2.3 2.2  --   --   --   PHOS  --   --  2.5 1.9* 3.0 3.4  --   --   --    Recent Labs  Lab 06/05/23 0815 06/06/23 0557  AST 20 26  ALT 13 12  ALKPHOS 76 72  BILITOT 1.2 1.1  PROT 6.5 6.1*  ALBUMIN  3.6 3.3*   Recent Labs  Lab 06/05/23 0815 06/05/23 0817 06/06/23 0557 06/07/23 0413 06/08/23 0556 06/09/23 0720  WBC 6.8  --  10.0 8.4 7.4 7.8  NEUTROABS 3.1  --   --   --   --   --   HGB 11.9* 12.6* 11.4* 11.8* 9.3* 9.7*  HCT 37.8* 37.0* 36.3* 37.1* 29.6* 31.2*  MCV 97.4  --  97.6 97.4 96.7 97.8  PLT 181  --  177 151 140* 144*    No results for input(s): CKTOTAL, CKMB, CKMBINDEX, TROPONINI in the last 168 hours. No results for input(s): LABPROT, INR in the last 72 hours.  No results for input(s): COLORURINE, LABSPEC, PHURINE, GLUCOSEU, HGBUR, BILIRUBINUR, KETONESUR, PROTEINUR, UROBILINOGEN, NITRITE, LEUKOCYTESUR in the last 72 hours.  Invalid input(s): APPERANCEUR      Component Value Date/Time   CHOL 183 06/06/2023 0557   TRIG 81 06/06/2023 0557   HDL 60 06/06/2023 0557   CHOLHDL 3.1 06/06/2023 0557   VLDL 16 06/06/2023 0557   LDLCALC 107 (H) 06/06/2023 0557   LDLCALC 223 (H) 05/31/2021 0000   Lab Results  Component Value Date   HGBA1C 6.1 (H) 06/05/2023      Component Value Date/Time   LABOPIA NONE DETECTED 06/05/2023 1653   COCAINSCRNUR  NONE DETECTED 06/05/2023 1653   LABBENZ NONE DETECTED 06/05/2023 1653   AMPHETMU NONE DETECTED 06/05/2023 1653   THCU NONE DETECTED 06/05/2023 1653   LABBARB NONE DETECTED 06/05/2023 1653    Recent Labs  Lab 06/05/23 0815  ETH <10    I have personally reviewed the radiological images below and agree with the radiology interpretations.  DG Abd 1 View Result Date: 06/09/2023 CLINICAL DATA:  [id number]. Encounter for feeding tube placement. EXAM: ABDOMEN - 1 VIEW COMPARISON:  Abdomen film 06/05/2023. FINDINGS: Dobbhoff feeding tube is well placed with the radiopaque tip in the distal stomach. The visualized bowel pattern is nonobstructive. There is barium newly seen in the flexures and transverse colon. There is no supine evidence of free air. There is atelectasis in the lung bases. Cardiomegaly. IMPRESSION: 1. Dobbhoff feeding tube well placed with the radiopaque tip in the distal stomach. 2. Nonobstructive bowel gas pattern. 3. Cardiomegaly. Electronically Signed   By: Francis Quam M.D.   On: 06/09/2023 03:41   DG CHEST PORT 1 VIEW Result Date: 06/08/2023 CLINICAL DATA:  CHF EXAM: PORTABLE CHEST 1 VIEW COMPARISON:  01/06/2023  FINDINGS: Feeding tube in place. There is cardiomegaly with vascular congestion. Chronic elevation of the right hemidiaphragm with right base atelectasis. Interstitial prominence throughout the lungs, right greater than left could reflect interstitial edema. Possible small effusions. No acute bony abnormality. IMPRESSION: Cardiomegaly with vascular congestion and probable mild interstitial edema. Question small bilateral effusions. Chronic elevation of the right hemidiaphragm with right base atelectasis. Electronically Signed   By: Franky Crease M.D.   On: 06/08/2023 18:20   DG Swallowing Func-Speech Pathology Result Date: 06/07/2023 Table formatting from the original result was not included. Modified Barium Swallow Study Study completed and documented by Waddell Music, SLP Student Supervised and reviewed by Consuelo Fort, MA CCC-SLP [facility] Secure Chat Preferred Office [phone] Patient Details Name: Nathan Proctor. MRN: [id number] Date of Birth: 27-Jul-1940 Today's Date: 06/07/2023 HPI/PMH: HPI: Pt is a 83 y.o. male who was admitted as a code stroke on 02/05 due to acute onset of right-sided weakness, right facial droop and aphasia. MRI from 02/06 displayed an evolving acute left MCA infarct, most pronounced at the left insula and left frontal operculum. Pt has intentionally lost 300 pounds in the recent past, with pre-loss weight at ~600Ib. PMHx includes hypertension, diabetes, CKD, prior CVA. Clinical Impression: Clinical Impression: Pt presented with a moderate-severe oropharyngeal dysphagia (DIGEST Score: 3) primarily characterized by silent aspiration of thin liquid, silent penetration of nectar-thick liquid, diffuse pharyngeal residue collection during thin/nectar-thick consistencies, and posterior-escape of bolus across multiple consistencies. Pt positioning was a potential limitation for examination due to consistent posterior head position. More anterior head positioning  during nectar-thick liquid displayed less pharyngeal residue, increased hyo-laryngeal excursion, and overall better airway safety. This positioning was not present throughout due to pt's receptive language deficits to understand cues. Alternating between honey-thick and puree consistencies provided more oropharyngeal bolus clearance and no signs of aspiration. Recommend a dysphagia 1 (puree) and honey-thick liquid diet to optimize pt's airway safety and potential for meeting nutritional needs. Slow rate, small bites/sips, natural head posturing, and multiple swallows are compensatory strategies that displayed best efficiency with pt. F/u with SLP for family education, compensatory strategy training, and upgraded diet trials is recommended. Factors that may increase risk of adverse event in presence of aspiration Noe & Lianne 2021): Factors that may increase risk of adverse event in presence of aspiration Noe & Lianne 2021):  Weak cough Recommendations/Plan: Swallowing Evaluation Recommendations Swallowing Evaluation Recommendations Recommendations: PO diet PO Diet Recommendation: Dysphagia 1 (Pureed); Moderately thick liquids (Level 3, honey thick) Liquid Administration via: Spoon Medication Administration: Crushed with puree Supervision: Full supervision/cueing for swallowing strategies; Full assist for feeding Swallowing strategies  : Slow rate; Small bites/sips; Multiple dry swallows after each bite/sip (Pt needs to be in a neutral head position (no posterior head tilt).) Postural changes: Position pt fully upright for meals; Stay upright 30-60 min after meals Oral care recommendations: Oral care BID (2x/day); Staff/trained caregiver to provide oral care Caregiver Recommendations: Have oral suction available Treatment Plan Treatment Plan Treatment recommendations: Therapy as outlined in treatment plan below Follow-up recommendations: Follow physicians's recommendations for discharge plan and follow up  therapies Functional status assessment: Patient has had a recent decline in their functional status and demonstrates the ability to make significant improvements in function in a reasonable and predictable amount of time. Treatment frequency: Min 2x/week Treatment duration: 2 weeks Interventions: Aspiration precaution training; Compensatory techniques; Patient/family education; Diet toleration management by SLP; Trials of upgraded texture/liquids Recommendations Recommendations for follow up therapy are one component of a multi-disciplinary discharge planning process, led by the attending physician.  Recommendations may be updated based on patient status, additional functional criteria and insurance authorization. Assessment: Orofacial Exam: Orofacial Exam Oral Cavity - Dentition: Adequate natural dentition Orofacial Anatomy: WFL Anatomy: Anatomy: WFL Boluses Administered: Boluses Administered Boluses Administered: Thin liquids (Level 0); Mildly thick liquids (Level 2, nectar thick); Moderately thick liquids (Level 3, honey thick); Puree; Solid  Oral Impairment Domain: Oral Impairment Domain Lip Closure: Escape progressing to mid-chin Tongue control during bolus hold: Posterior escape of greater than half of bolus Bolus preparation/mastication: -- (Pt did not chew solid and SLP had to remove it from mouth.) Bolus transport/lingual motion: Repetitive/disorganized tongue motion Oral residue: Residue collection on oral structures Location of oral residue : Tongue; Palate Initiation of pharyngeal swallow : Pyriform sinuses  Pharyngeal Impairment Domain: Pharyngeal Impairment Domain Soft palate elevation: No bolus between soft palate (SP)/pharyngeal wall (PW) Laryngeal elevation: Partial superior movement of thyroid  cartilage/partial approximation of arytenoids to epiglottic petiole Anterior hyoid excursion: Partial anterior movement Epiglottic movement: Partial inversion Laryngeal vestibule closure: Incomplete, narrow  column air/contrast in laryngeal vestibule Pharyngeal stripping wave : Present - complete Pharyngeal contraction (A/P view only): N/A Pharyngoesophageal segment opening: Complete distension and complete duration, no obstruction of flow Tongue base retraction: Narrow column of contrast or air between tongue base and PPW Pharyngeal residue: Collection of residue within or on pharyngeal structures Location of pharyngeal residue: Diffuse (>3 areas)  Esophageal Impairment Domain: Esophageal Impairment Domain Esophageal clearance upright position: -- (Not tested.) Pill: No data recorded Penetration/Aspiration Scale Score: Penetration/Aspiration Scale Score 1.  Material does not enter airway: Puree; Moderately thick liquids (Level 3, honey thick) 3.  Material enters airway, remains ABOVE vocal cords and not ejected out: Mildly thick liquids (Level 2, nectar thick) 8.  Material enters airway, passes BELOW cords without attempt by patient to eject out (silent aspiration) : Thin liquids (Level 0) Compensatory Strategies: Compensatory Strategies Compensatory strategies: Yes Straw: Ineffective Multiple swallows: Effective (Required max cueing due to pt's language.) Effective Multiple Swallows: Puree; Moderately thick liquid (Level 3, honey thick); Mildly thick liquid (Level 2, nectar thick)   General Information: Caregiver present: No  Diet Prior to this Study: NPO   Temperature : Normal   Respiratory Status: WFL   Supplemental O2: None (Room air)   No data recorded Behavior/Cognition: Alert; Cooperative;  Pleasant mood; Requires cueing Self-Feeding Abilities: Needs assist with self-feeding Baseline vocal quality/speech: Dysphonic Volitional Cough: Unable to elicit Volitional Swallow: Able to elicit Exam Limitations: Poor positioning Goal Planning: Prognosis for improved oropharyngeal function: Good Barriers to Reach Goals: Language deficits No data recorded No data recorded Consulted and agree with results and recommendations:  Pt unable/family or caregiver not available Pain: Pain Assessment Pain Assessment: No/denies pain Faces Pain Scale: 0 Facial Expression: 0 Body Movements: 0 Muscle Tension: 0 Compliance with ventilator (intubated pts.): N/A Vocalization (extubated pts.): 0 CPOT Total: 0 End of Session: Start Time:SLP Start Time (ACUTE ONLY): 0903 Stop Time: SLP Stop Time (ACUTE ONLY): 0930 Time Calculation:SLP Time Calculation (min) (ACUTE ONLY): 27 min Charges: SLP Evaluations $ SLP Speech Visit: 1 Visit SLP Evaluations $BSS Swallow: 1 Procedure $MBS Swallow: 1 Procedure $ SLP EVAL LANGUAGE/SOUND PRODUCTION: 1 Procedure $Swallowing Treatment: 1 Procedure SLP visit diagnosis: SLP Visit Diagnosis: Dysphagia, oropharyngeal phase (R13.12) Past Medical History: Past Medical History: Diagnosis Date  Anemia of chronic disease   Ankle fracture 02/15/2016  Cancer (HCC)   Prostate  Chronic constipation   Chronic kidney disease   stage III  Diabetes mellitus without complication (HCC)   type II   Diabetic peripheral neuropathy (HCC)   Failure to thrive (0-17)   Fracture of left lower leg   Gout   Hyperlipidemia   Hypertension   Morbid obesity (HCC)   Unstable gait  Past Surgical History: Past Surgical History: Procedure Laterality Date  IR CT HEAD LTD  06/05/2023  IR INTRAVSC STENT CERV CAROTID W/O EMB-PROT MOD SED INC ANGIO  06/05/2023  IR PERCUTANEOUS ART THROMBECTOMY/INFUSION INTRACRANIAL INC DIAG ANGIO  06/05/2023  IR US  GUIDE VASC ACCESS RIGHT  06/05/2023  ORIF ANKLE FRACTURE Left 02/29/2016  Procedure: OPEN REDUCTION INTERNAL FIXATION (ORIF) ANKLE FRACTURE;  Surgeon: Selinda Belvie Gosling, MD;  Location: [facility];  Service: Orthopedics;  Laterality: Left;  PROSTATE SURGERY    RADIOLOGY WITH ANESTHESIA N/A 06/05/2023  Procedure: IR WITH ANESTHESIA;  Surgeon: Radiologist, Medication, MD;  Location: [facility];  Service: Radiology;  Laterality: N/A; DeBlois, Consuelo Fitch 06/07/2023, 12:15 PM  CT HEAD WO CONTRAST ( ) Result Date: 06/07/2023 CLINICAL  DATA:  83 year old male status post code stroke presentation, left MCA M2 occlusion, with left MCA infarct with confluent petechial hemorrhage. EXAM: CT HEAD WITHOUT CONTRAST TECHNIQUE: Contiguous axial images were obtained from the base of the skull through the vertex without intravenous contrast. RADIATION DOSE REDUCTION: This exam was performed according to the departmental dose-optimization program which includes automated exposure control, adjustment of the mA and/or kV according to patient size and/or use of iterative reconstruction technique. COMPARISON:  Brain MRI yesterday.  Head CT 06/05/2023. FINDINGS: Brain: Confluent mixed density infarct in the left MCA middle division, epicenter at the insula and operculum. Confluent petechial hemorrhage on series 3, image 16 appears stable from the MRI. Superimposed trace subarachnoid blood is difficult to exclude by CT (series 3, image 13), but was not apparent on MRI. Stable mild mass effect on the left lateral ventricle with only trace rightward midline shift (series 3, image 16). No ventriculomegaly. Elsewhere gray-white differentiation is stable from the presentation CT. Basilar cisterns remain patent. Vascular: Resolved hyperdense left MCA since presentation. Calcified atherosclerosis at the skull base. Skull: Stable, intact. Sinuses/Orbits: Visualized paranasal sinuses and mastoids are stable and well aerated. Other: Left nasoenteric tube now in place. Stable orbit and scalp soft tissues. IMPRESSION: 1. Stable by CT confluent Left MCA middle division infarct with petechial  hemorrhage (Heidelberg classification 1b: HI2, confluent petechiae, no mass effect). Questionable trace superimposed SAH, but was not apparent on MRI. 2. Stable minimal intracranial mass effect. 3. No new intracranial abnormality. Electronically Signed   By: VEAR Hurst M.D.   On: 06/07/2023 05:37   MR BRAIN WO CONTRAST Result Date: 06/06/2023 CLINICAL DATA:  Follow-up examination for stroke.  EXAM: MRI HEAD WITHOUT CONTRAST TECHNIQUE: Multiplanar, multiecho pulse sequences of the brain and surrounding structures were obtained without intravenous contrast. COMPARISON:  Comparison made to multiple previous exams from 06/05/2023. FINDINGS: Brain: Examination degraded by motion artifact. Cerebral volume within normal limits for age. Patchy T2/FLAIR hyperintensity involving the periventricular deep white matter both cerebral hemispheres, consistent with chronic small vessel ischemic disease, mild in nature. Small remote infarct noted within the right cerebellum. Confluent restricted diffusion involving the left insula and left frontal operculum, consistent with evolving acute left MCA distribution infarct. Area of infarction measures up to approximately 6 cm in AP diameter. Prominent associated susceptibility artifact, consistent with petechial hemorrhage (series 7, image 61) ( Heidelberg classification 1b: HI2, confluent petechiae, no mass effect. No frank or organized hematoma evident by MRI. No significant regional mass effect. Few additional small foci of infarction noted within the left parieto-occipital region posteriorly (series 3, images 40, 29). Apparent diffusion signal along the right frontal parafalcine region felt to be consistent with artifact related to dural calcification. Note made of a few additional chronic micro hemorrhages within the left cerebellum and right thalamus. No mass lesion or midline shift. Ventricles normal size without hydrocephalus. No extra-axial fluid collection. Pituitary gland suprasellar region within normal limits. Vascular: Major intracranial vascular flow voids are maintained. Skull and upper cervical spine: Craniocervical junction within normal limits. Bone marrow signal intensity overall within normal limits. No scalp soft tissue abnormality. Sinuses/Orbits: Prior bilateral ocular lens replacement. Paranasal sinuses are clear. No mastoid effusion. Other: None.  IMPRESSION: 1. Evolving acute left MCA distribution infarct, most pronounced at the left insula and left frontal operculum. Prominent associated petechial hemorrhage without frank intraparenchymal hematoma (Heidelberg classification 1b: HI2, confluent petechiae, no mass effect). 2. Few additional small foci of acute infarction within the left parieto-occipital region posteriorly. 3. Underlying mild chronic microvascular ischemic disease with small remote right cerebellar infarct. Electronically Signed   By: Morene Hoard M.D.   On: 06/06/2023 03:12   ECHOCARDIOGRAM COMPLETE Result Date: 06/05/2023    ECHOCARDIOGRAM REPORT   Patient Name:   Clovis Mankins. Date of Exam: 06/05/2023 Medical Rec #:  [id number]              Height:       70.0 in Accession #:    [id number]             Weight:       292.3 lb Date of Birth:  Jan 11, 1941              BSA:          2.453 m Patient Age:    82 years               BP:           129/72 mmHg Patient Gender: M                      HR:           89 bpm. Exam Location:  Inpatient Procedure: 2D Echo, Color Doppler, Cardiac Doppler and Intracardiac  Opacification Agent Indications:    Stroke I63.9  History:        Patient has prior history of Echocardiogram examinations, most                 recent 10/06/2019. Risk Factors:Diabetes, Hypertension and                 Dyslipidemia.  Sonographer:    Tinnie Gosling RDCS Referring Phys: ERIN C LEHNER IMPRESSIONS  1. Left ventricular ejection fraction, by estimation, is 30%. The left ventricle has moderately decreased function. The left ventricle demonstrates global hypokinesis. There is mild left ventricular hypertrophy.  2. Right ventricular systolic function is normal. The right ventricular size is normal.  3. Trivial mitral valve regurgitation.  4. The aortic valve is tricuspid. Aortic valve regurgitation is not visualized.  5. The inferior vena cava is normal in size with greater than 50% respiratory variability,  suggesting right atrial pressure of 3 mmHg. Comparison(s): The left ventricular function is worsened. FINDINGS  Left Ventricle: Left ventricular ejection fraction, by estimation, is 30%. The left ventricle has moderately decreased function. The left ventricle demonstrates global hypokinesis. Definity  contrast agent was given IV to delineate the left ventricular endocardial borders. The left ventricular internal cavity size was normal in size. There is mild left ventricular hypertrophy. Right Ventricle: The right ventricular size is normal. Right vetricular wall thickness was not assessed. Right ventricular systolic function is normal. Left Atrium: Left atrial size was normal in size. Right Atrium: Right atrial size was normal in size. Pericardium: There is no evidence of pericardial effusion. Mitral Valve: There is mild thickening of the mitral valve leaflet(s). Mild to moderate mitral annular calcification. Trivial mitral valve regurgitation. Tricuspid Valve: The tricuspid valve is normal in structure. Tricuspid valve regurgitation is trivial. Aortic Valve: The aortic valve is tricuspid. Aortic valve regurgitation is not visualized. Pulmonic Valve: The pulmonic valve was normal in structure. Pulmonic valve regurgitation is not visualized. Aorta: The aortic root and ascending aorta are structurally normal, with no evidence of dilitation. Venous: The inferior vena cava is normal in size with greater than 50% respiratory variability, suggesting right atrial pressure of 3 mmHg. IAS/Shunts: The interatrial septum was not assessed.  LEFT VENTRICLE PLAX 2D LVIDd:         5.00 cm   Diastology LVIDs:         4.40 cm   LV e' lateral: 5.33 cm/s LV PW:         1.20 cm LV IVS:        1.10 cm LVOT diam:     2.30 cm LV SV:         55 LV SV Index:   23 LVOT Area:     4.15 cm  RIGHT VENTRICLE RV S prime:     14.40 cm/s LEFT ATRIUM         Index LA diam:    4.60 cm 1.88 cm/m  AORTIC VALVE LVOT Vmax:   65.50 cm/s LVOT Vmean:   45.200 cm/s LVOT VTI:    0.133 m  AORTA Ao Root diam: 3.00 cm Ao Asc diam:  3.50 cm  SHUNTS Systemic VTI:  0.13 m Systemic Diam: 2.30 cm Vina Gull MD Electronically signed by Vina Gull MD Signature Date/Time: 06/05/2023/9:33:38 PM    Final    DG Abd Portable 1V Result Date: 06/05/2023 CLINICAL DATA:  Feeding tube placement. EXAM: PORTABLE ABDOMEN - 1 VIEW COMPARISON:  None Available. FINDINGS: Distal tip of feeding tube is  seen in expected position of distal stomach. IMPRESSION: Distal tip of feeding tube seen in expected position of distal stomach. Electronically Signed   By: Lynwood Landy Raddle M.D.   On: 06/05/2023 15:49   CT HEAD WO CONTRAST Result Date: 06/05/2023 CLINICAL DATA:  Stroke, follow-up. Status post intracranial mechanical thrombectomy and stenting of the left ICA bifurcation. EXAM: CT HEAD WITHOUT CONTRAST TECHNIQUE: Contiguous axial images were obtained from the base of the skull through the vertex without intravenous contrast. RADIATION DOSE REDUCTION: This exam was performed according to the departmental dose-optimization program which includes automated exposure control, adjustment of the mA and/or kV according to patient size and/or use of iterative reconstruction technique. COMPARISON:  CT head without contrast and CT angio head and neck 06/05/2023. By plain CT 06/05/2023. FINDINGS: Brain: The study is mildly degraded by patient motion. The left insular and left opercular infarct is somewhat obscured by patient motion. The cortex is slightly hyperdense, potentially reflecting reperfusion. No hemorrhage is present. Basal ganglia are intact. Subcortical white matter hypoattenuation in the high left frontal lobe is new. Mild right-sided white matter disease is stable. Basal ganglia are intact. A remote lacunar infarct is again noted in the right cerebellum. The brainstem and cerebellum are otherwise within normal limits. Vascular: Atherosclerotic calcifications are present within the cavernous  internal carotid arteries and at the normal origin of both vertebral arteries. No hyperdense vessel is present. Skull: Calvarium is intact. No focal lytic or blastic lesions are present. No significant extracranial soft tissue lesion is present. Sinuses/Orbits: The paranasal sinuses and mastoid air cells are clear. Bilateral lens replacements are noted. Globes and orbits are otherwise unremarkable. IMPRESSION: 1. The left insular and left opercular infarct is somewhat obscured by patient motion. 2. The cortex is slightly hyperdense, potentially reflecting reperfusion. 3. No hemorrhage. 4. Subcortical white matter hypoattenuation in the high left frontal lobe is new. This may reflect ischemic changes or edema related to the reperfusion. 5. Remote lacunar infarct of the right cerebellum. 6. Stable mild right-sided white matter disease. This likely reflects the sequela of chronic microvascular ischemia. Electronically Signed   By: Lonni Necessary M.D.   On: 06/05/2023 14:38   IR PERCUTANEOUS ART THROMBECTOMY/INFUSION INTRACRANIAL INC DIAG ANGIO Result Date: 06/05/2023 INDICATION: 83 year old male presenting with right-sided weakness and aphasia; NIHSS 28. His last known well was 10 p.m. on 06/04/2023. His past medical history significant for prior stroke, hypertension, diabetes and chronic kidney disease; baseline modified Rankin scale 0. Head CT showed hypodensity within the left insula, basal ganglia and left frontal operculum (ASPECTS 7). No IV thrombolytic given as patient was outside the window. CT angiogram of the head and neck showed an occlusion of a left M2/MCA anterior division branch. CT perfusion showed a 41 mL core infarct with a 45 mL ischemic penumbra. She was transferred to our service for mechanical thrombectomy. EXAM: ULTRASOUND-GUIDED VASCULAR ACCESS DIAGNOSTIC CEREBRAL ANGIOGRAM MECHANICAL THROMBECTOMY FLAT PANEL HEAD CT LEFT CAROTID STENTING AND ANGIOPLASTY WITHOUT CEREBRAL PROTECTION DEVICE  COMPARISON:  CT/CT angiogram of the head and neck June 05, 2023. MEDICATIONS: No antibiotics administered. ANESTHESIA/SEDATION: The procedure was performed under general anesthesia. CONTRAST:  80 mL of Omnipaque  300 milligram/mL FLUOROSCOPY: Radiation Exposure Index (as provided by the fluoroscopic device): 1315 mGy Kerma COMPLICATIONS: None immediate. TECHNIQUE: Informed written consent was obtained from the patient's wife after a thorough discussion of the procedural risks, benefits and alternatives. All questions were addressed. Maximal Sterile Barrier Technique was utilized including caps, mask, sterile gowns, sterile gloves,  sterile drape, hand hygiene and skin antiseptic. A timeout was performed prior to the initiation of the procedure. The right groin was prepped and draped in the usual sterile fashion. Using a micropuncture kit and the modified Seldinger technique, access was gained to the right common femoral artery and an 8 French sheath was placed. Real-time ultrasound guidance was utilized for vascular access including the acquisition of a permanent ultrasound image documenting patency of the accessed vessel. Under fluoroscopy, an 8 French Walrus balloon guide catheter was navigated over a 6 French VTK catheter and a 0.035 Terumo Glidewire into the aortic arch. The catheter was placed into the left common carotid artery and then advanced into the left internal carotid artery. The diagnostic catheter was removed. Frontal and lateral angiograms of the head were obtained. FINDINGS: 1. Ultrasound showed heavily calcified right common femoral artery is maintained patency and caliber. 2. Proximal occlusion of a left M2/MCA anterior division branch. 3. Atherosclerotic changes of the intracranial left ICA with mild stenosis at the distal cavernous segment. 4. 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  PROCEDURE: Using biplane roadmap guidance, a Red 62 aspiration catheter was navigated over Colossus 35 microguidewire into the cavernous segment of the left ICA. The aspiration catheter was then advanced to the level of occlusion and connected to an aspiration pump. Continuous aspiration was performed for 2 minutes. The guide catheter was connected to a VacLok syringe and the guiding catheter balloon was inflated. The aspiration catheter was subsequently removed under constant aspiration. The guide catheter was aspirated for debris. Left internal carotid artery angiograms with frontal and lateral views of the head showed complete recanalization of the left MCA vascular tree. The guide catheter was retracted into the neck. Frontal and lateral angiograms of the neck were obtained. Improvement of the degree of stenosis in the carotid bulb compared to prior CT angiogram. There is prominent luminal irregularity at the carotid bulb residual moderate stenosis. Increased tortuosity of the proximal/mid cervical left ICA with kinking. Left internal carotid artery angiograms with left anterior oblique views of the neck showed evidence of filling defects at the level of the carotid bulb. Flat panel CT of the head was obtained and post processed in a separate workstation with concurrent attending physician supervision. Selected images were sent to PACS. No evidence of hemorrhagic complication. There is mild contrast staining of the left insular and frontal cortex. Repeat left internal carotid artery angiograms with frontal and lateral views of the head showed Amy seen left M3/MCA branch to the left parietal region. Left common carotid artery angiograms with frontal and lateral views of the neck showed vertebra Gretchen of stenosis at the left ICA bulb with more prominent filling defect. At this point, patient was loaded on cangrelor  followed by continuous drip. Using biplane roadmap guidance, a 4-7 mm Emboshield NAV6 cerebral  protection device was advanced into the cervical left ICA. However, multiple attempts to advance a cerebral protection device through the left ICA kinking proved unsuccessful. The cerebral protection device was subsequently removed. Using biplane roadmap guidance, a 10-8 x 40 mm XACT carotid stent was navigated and deployed from the distal left common carotid artery to the proximal left internal carotid artery, proximal to the vessel kinking. Suboptimal stent expansion was noted. Then, a 6 x 30 mm Viatrac balloon was navigated into the recently deployed stent. Angioplasty was performed under fluoroscopy. Left internal carotid artery angiograms with  frontal and lateral views of the neck showed adequate stent positioning and expansion with brisk anterograde flow. Left internal carotid artery angiograms with frontal and lateral views of the head showed improvement of anterograde flow in the left MCA vascular tree with brisk anterograde flow. Delayed left common carotid artery angiograms with frontal and lateral views of the neck showed no evidence of clot formation within the stent. The catheter was subsequently Riddle. Right common femoral artery angiogram was obtained in right anterior oblique view. The puncture is at the level of the common femoral artery. The artery has normal caliber, adequate for closure device. The sheath was exchanged over the wire for an 8 French Angio-Seal which was utilized for access closure. Immediate hemostasis was achieved. IMPRESSION: 1. Successful mechanical thrombectomy for treatment of a proximal left M2/MCA anterior division branch occlusion achieving complete recanalization (TICI 3). 2. 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 PLAN: Continue cangrelor  infusion until patient is transitioned to oral dual antiplatelet therapy.  Electronically Signed   By: Katyucia  de Macedo Rodrigues M.D.   On: 06/05/2023 13:16   IR US  Guide Vasc Access Right Result Date: 06/05/2023 INDICATION: 83 year old male presenting with right-sided weakness and aphasia; NIHSS 28. His last known well was 10 p.m. on 06/04/2023. His past medical history significant for prior stroke, hypertension, diabetes and chronic kidney disease; baseline modified Rankin scale 0. Head CT showed hypodensity within the left insula, basal ganglia and left frontal operculum (ASPECTS 7). No IV thrombolytic given as patient was outside the window. CT angiogram of the head and neck showed an occlusion of a left M2/MCA anterior division branch. CT perfusion showed a 41 mL core infarct with a 45 mL ischemic penumbra. She was transferred to our service for mechanical thrombectomy. EXAM: ULTRASOUND-GUIDED VASCULAR ACCESS DIAGNOSTIC CEREBRAL ANGIOGRAM MECHANICAL THROMBECTOMY FLAT PANEL HEAD CT LEFT CAROTID STENTING AND ANGIOPLASTY WITHOUT CEREBRAL PROTECTION DEVICE COMPARISON:  CT/CT angiogram of the head and neck June 05, 2023. MEDICATIONS: No antibiotics administered. ANESTHESIA/SEDATION: The procedure was performed under general anesthesia. CONTRAST:  80 mL of Omnipaque  300 milligram/mL FLUOROSCOPY: Radiation Exposure Index (as provided by the fluoroscopic device): 1315 mGy Kerma COMPLICATIONS: None immediate. TECHNIQUE: Informed written consent was obtained from the patient's wife after a thorough discussion of the procedural risks, benefits and alternatives. All questions were addressed. Maximal Sterile Barrier Technique was utilized including caps, mask, sterile gowns, sterile gloves, sterile drape, hand hygiene and skin antiseptic. A timeout was performed prior to the initiation of the procedure. The right groin was prepped and draped in the usual sterile fashion. Using a micropuncture kit and the modified Seldinger technique, access was gained to the right common femoral artery  and an 8 French sheath was placed. Real-time ultrasound guidance was utilized for vascular access including the acquisition of a permanent ultrasound image documenting patency of the accessed vessel. Under fluoroscopy, an 8 French Walrus balloon guide catheter was navigated over a 6 French VTK catheter and a 0.035 Terumo Glidewire into the aortic arch. The catheter was placed into the left common carotid artery and then advanced into the left internal carotid artery. The diagnostic catheter was removed. Frontal and lateral angiograms of the head were obtained. FINDINGS: 1. Ultrasound showed heavily calcified right common femoral artery is maintained patency and caliber. 2. Proximal occlusion of a left M2/MCA anterior division branch. 3. Atherosclerotic changes of the intracranial left ICA with mild  stenosis at the distal cavernous segment. 4. 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 PROCEDURE: Using biplane roadmap guidance, a Red 62 aspiration catheter was navigated over Colossus 35 microguidewire into the cavernous segment of the left ICA. The aspiration catheter was then advanced to the level of occlusion and connected to an aspiration pump. Continuous aspiration was performed for 2 minutes. The guide catheter was connected to a VacLok syringe and the guiding catheter balloon was inflated. The aspiration catheter was subsequently removed under constant aspiration. The guide catheter was aspirated for debris. Left internal carotid artery angiograms with frontal and lateral views of the head showed complete recanalization of the left MCA vascular tree. The guide catheter was retracted into the neck. Frontal and lateral angiograms of the neck were obtained. Improvement of the degree of stenosis in the carotid bulb compared to prior CT angiogram. There is prominent luminal irregularity at the carotid bulb  residual moderate stenosis. Increased tortuosity of the proximal/mid cervical left ICA with kinking. Left internal carotid artery angiograms with left anterior oblique views of the neck showed evidence of filling defects at the level of the carotid bulb. Flat panel CT of the head was obtained and post processed in a separate workstation with concurrent attending physician supervision. Selected images were sent to PACS. No evidence of hemorrhagic complication. There is mild contrast staining of the left insular and frontal cortex. Repeat left internal carotid artery angiograms with frontal and lateral views of the head showed Amy seen left M3/MCA branch to the left parietal region. Left common carotid artery angiograms with frontal and lateral views of the neck showed vertebra Gretchen of stenosis at the left ICA bulb with more prominent filling defect. At this point, patient was loaded on cangrelor  followed by continuous drip. Using biplane roadmap guidance, a 4-7 mm Emboshield NAV6 cerebral protection device was advanced into the cervical left ICA. However, multiple attempts to advance a cerebral protection device through the left ICA kinking proved unsuccessful. The cerebral protection device was subsequently removed. Using biplane roadmap guidance, a 10-8 x 40 mm XACT carotid stent was navigated and deployed from the distal left common carotid artery to the proximal left internal carotid artery, proximal to the vessel kinking. Suboptimal stent expansion was noted. Then, a 6 x 30 mm Viatrac balloon was navigated into the recently deployed stent. Angioplasty was performed under fluoroscopy. Left internal carotid artery angiograms with frontal and lateral views of the neck showed adequate stent positioning and expansion with brisk anterograde flow. Left internal carotid artery angiograms with frontal and lateral views of the head showed improvement of anterograde flow in the left MCA vascular tree with brisk  anterograde flow. Delayed left common carotid artery angiograms with frontal and lateral views of the neck showed no evidence of clot formation within the stent. The catheter was subsequently Riddle. Right common femoral artery angiogram was obtained in right anterior oblique view. The puncture is at the level of the common femoral artery. The artery has normal caliber, adequate for closure device. The sheath was exchanged over the wire for an 8 French Angio-Seal which was utilized for access closure. Immediate hemostasis was achieved. IMPRESSION: 1. Successful mechanical thrombectomy for treatment of a proximal left M2/MCA anterior division branch occlusion achieving complete recanalization (TICI 3). 2. Atherosclerotic disease of the left carotid bifurcation with stenosis and clot formation suggesting acute plaque rupture treated with stenting and angioplasty with  resolution of stenosis. PLAN: Continue cangrelor  infusion until patient is transitioned to oral dual antiplatelet therapy. Electronically Signed   By: Katyucia  de Macedo Rodrigues M.D.   On: 06/05/2023 13:16   IR INTRAVSC STENT CERV CAROTID W/O EMB-PROT MOD SED Result Date: 06/05/2023 INDICATION: 83 year old male presenting with right-sided weakness and aphasia; NIHSS 28. His last known well was 10 p.m. on 06/04/2023. His past medical history significant for prior stroke, hypertension, diabetes and chronic kidney disease; baseline modified Rankin scale 0. Head CT showed hypodensity within the left insula, basal ganglia and left frontal operculum (ASPECTS 7). No IV thrombolytic given as patient was outside the window. CT angiogram of the head and neck showed an occlusion of a left M2/MCA anterior division branch. CT perfusion showed a 41 mL core infarct with a 45 mL ischemic penumbra. She was transferred to our service for mechanical thrombectomy. EXAM: ULTRASOUND-GUIDED VASCULAR ACCESS DIAGNOSTIC CEREBRAL ANGIOGRAM MECHANICAL THROMBECTOMY FLAT PANEL  HEAD CT LEFT CAROTID STENTING AND ANGIOPLASTY WITHOUT CEREBRAL PROTECTION DEVICE COMPARISON:  CT/CT angiogram of the head and neck June 05, 2023. MEDICATIONS: No antibiotics administered. ANESTHESIA/SEDATION: The procedure was performed under general anesthesia. CONTRAST:  80 mL of Omnipaque  300 milligram/mL FLUOROSCOPY: Radiation Exposure Index (as provided by the fluoroscopic device): 1315 mGy Kerma COMPLICATIONS: None immediate. TECHNIQUE: Informed written consent was obtained from the patient's wife after a thorough discussion of the procedural risks, benefits and alternatives. All questions were addressed. Maximal Sterile Barrier Technique was utilized including caps, mask, sterile gowns, sterile gloves, sterile drape, hand hygiene and skin antiseptic. A timeout was performed prior to the initiation of the procedure. The right groin was prepped and draped in the usual sterile fashion. Using a micropuncture kit and the modified Seldinger technique, access was gained to the right common femoral artery and an 8 French sheath was placed. Real-time ultrasound guidance was utilized for vascular access including the acquisition of a permanent ultrasound image documenting patency of the accessed vessel. Under fluoroscopy, an 8 French Walrus balloon guide catheter was navigated over a 6 French VTK catheter and a 0.035 Terumo Glidewire into the aortic arch. The catheter was placed into the left common carotid artery and then advanced into the left internal carotid artery. The diagnostic catheter was removed. Frontal and lateral angiograms of the head were obtained. FINDINGS: 1. Ultrasound showed heavily calcified right common femoral artery is maintained patency and caliber. 2. Proximal occlusion of a left M2/MCA anterior division branch. 3. Atherosclerotic changes of the intracranial left ICA with mild stenosis at the distal cavernous segment. 4. 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 PROCEDURE: Using biplane roadmap guidance, a Red 62 aspiration catheter was navigated over Colossus 35 microguidewire into the cavernous segment of the left ICA. The aspiration catheter was then advanced to the level of occlusion and connected to an aspiration pump. Continuous aspiration was performed for 2 minutes. The guide catheter was connected to a VacLok syringe and the guiding catheter balloon was inflated. The aspiration catheter was subsequently removed under constant aspiration. The guide catheter was aspirated for debris. Left internal carotid artery angiograms with frontal and lateral views of the head showed complete recanalization of the left MCA vascular tree. The guide catheter was retracted into the neck. Frontal and lateral angiograms of the neck were obtained. Improvement of the degree of stenosis in the carotid bulb compared to prior CT angiogram. There is prominent luminal  irregularity at the carotid bulb residual moderate stenosis. Increased tortuosity of the proximal/mid cervical left ICA with kinking. Left internal carotid artery angiograms with left anterior oblique views of the neck showed evidence of filling defects at the level of the carotid bulb. Flat panel CT of the head was obtained and post processed in a separate workstation with concurrent attending physician supervision. Selected images were sent to PACS. No evidence of hemorrhagic complication. There is mild contrast staining of the left insular and frontal cortex. Repeat left internal carotid artery angiograms with frontal and lateral views of the head showed Amy seen left M3/MCA branch to the left parietal region. Left common carotid artery angiograms with frontal and lateral views of the neck showed vertebra Gretchen of stenosis at the left ICA bulb with more prominent filling defect. At this point, patient was loaded on cangrelor  followed by  continuous drip. Using biplane roadmap guidance, a 4-7 mm Emboshield NAV6 cerebral protection device was advanced into the cervical left ICA. However, multiple attempts to advance a cerebral protection device through the left ICA kinking proved unsuccessful. The cerebral protection device was subsequently removed. Using biplane roadmap guidance, a 10-8 x 40 mm XACT carotid stent was navigated and deployed from the distal left common carotid artery to the proximal left internal carotid artery, proximal to the vessel kinking. Suboptimal stent expansion was noted. Then, a 6 x 30 mm Viatrac balloon was navigated into the recently deployed stent. Angioplasty was performed under fluoroscopy. Left internal carotid artery angiograms with frontal and lateral views of the neck showed adequate stent positioning and expansion with brisk anterograde flow. Left internal carotid artery angiograms with frontal and lateral views of the head showed improvement of anterograde flow in the left MCA vascular tree with brisk anterograde flow. Delayed left common carotid artery angiograms with frontal and lateral views of the neck showed no evidence of clot formation within the stent. The catheter was subsequently Riddle. Right common femoral artery angiogram was obtained in right anterior oblique view. The puncture is at the level of the common femoral artery. The artery has normal caliber, adequate for closure device. The sheath was exchanged over the wire for an 8 French Angio-Seal which was utilized for access closure. Immediate hemostasis was achieved. IMPRESSION: 1. Successful mechanical thrombectomy for treatment of a proximal left M2/MCA anterior division branch occlusion achieving complete recanalization (TICI 3). 2. 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 PLAN: Continue cangrelor   infusion until patient is transitioned to oral dual antiplatelet therapy. Electronically Signed   By: Katyucia  de Macedo Rodrigues M.D.   On: 06/05/2023 13:16   IR CT Head Ltd Result Date: 06/05/2023 INDICATION: 83 year old male presenting with right-sided weakness and aphasia; NIHSS 28. His last known well was 10 p.m. on 06/04/2023. His past medical history significant for prior stroke, hypertension, diabetes and chronic kidney disease; baseline modified Rankin scale 0. Head CT showed hypodensity within the left insula, basal ganglia and left frontal operculum (ASPECTS 7). No IV thrombolytic given as patient was outside the window. CT angiogram of the head and neck showed an occlusion of a left M2/MCA anterior division branch. CT perfusion showed a 41 mL core infarct with a 45 mL ischemic penumbra. She was transferred to our service for mechanical thrombectomy. EXAM: ULTRASOUND-GUIDED VASCULAR ACCESS DIAGNOSTIC CEREBRAL ANGIOGRAM MECHANICAL THROMBECTOMY FLAT PANEL HEAD CT LEFT CAROTID STENTING AND ANGIOPLASTY WITHOUT CEREBRAL PROTECTION DEVICE  COMPARISON:  CT/CT angiogram of the head and neck June 05, 2023. MEDICATIONS: No antibiotics administered. ANESTHESIA/SEDATION: The procedure was performed under general anesthesia. CONTRAST:  80 mL of Omnipaque  300 milligram/mL FLUOROSCOPY: Radiation Exposure Index (as provided by the fluoroscopic device): 1315 mGy Kerma COMPLICATIONS: None immediate. TECHNIQUE: Informed written consent was obtained from the patient's wife after a thorough discussion of the procedural risks, benefits and alternatives. All questions were addressed. Maximal Sterile Barrier Technique was utilized including caps, mask, sterile gowns, sterile gloves, sterile drape, hand hygiene and skin antiseptic. A timeout was performed prior to the initiation of the procedure. The right groin was prepped and draped in the usual sterile fashion. Using a micropuncture kit and the modified Seldinger  technique, access was gained to the right common femoral artery and an 8 French sheath was placed. Real-time ultrasound guidance was utilized for vascular access including the acquisition of a permanent ultrasound image documenting patency of the accessed vessel. Under fluoroscopy, an 8 French Walrus balloon guide catheter was navigated over a 6 French VTK catheter and a 0.035 Terumo Glidewire into the aortic arch. The catheter was placed into the left common carotid artery and then advanced into the left internal carotid artery. The diagnostic catheter was removed. Frontal and lateral angiograms of the head were obtained. FINDINGS: 1. Ultrasound showed heavily calcified right common femoral artery is maintained patency and caliber. 2. Proximal occlusion of a left M2/MCA anterior division branch. 3. Atherosclerotic changes of the intracranial left ICA with mild stenosis at the distal cavernous segment. 4. 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 PROCEDURE: Using biplane roadmap guidance, a Red 62 aspiration catheter was navigated over Colossus 35 microguidewire into the cavernous segment of the left ICA. The aspiration catheter was then advanced to the level of occlusion and connected to an aspiration pump. Continuous aspiration was performed for 2 minutes. The guide catheter was connected to a VacLok syringe and the guiding catheter balloon was inflated. The aspiration catheter was subsequently removed under constant aspiration. The guide catheter was aspirated for debris. Left internal carotid artery angiograms with frontal and lateral views of the head showed complete recanalization of the left MCA vascular tree. The guide catheter was retracted into the neck. Frontal and lateral angiograms of the neck were obtained. Improvement of the degree of stenosis in the carotid bulb compared to prior CT angiogram.  There is prominent luminal irregularity at the carotid bulb residual moderate stenosis. Increased tortuosity of the proximal/mid cervical left ICA with kinking. Left internal carotid artery angiograms with left anterior oblique views of the neck showed evidence of filling defects at the level of the carotid bulb. Flat panel CT of the head was obtained and post processed in a separate workstation with concurrent attending physician supervision. Selected images were sent to PACS. No evidence of hemorrhagic complication. There is mild contrast staining of the left insular and frontal cortex. Repeat left internal carotid artery angiograms with frontal and lateral views of the head showed Amy seen left M3/MCA branch to the left parietal region. Left common carotid artery angiograms with frontal and lateral views of the neck showed vertebra Gretchen of stenosis at the left ICA bulb with more prominent filling defect. At this point, patient was loaded on cangrelor  followed by continuous drip. Using biplane roadmap guidance, a 4-7 mm Emboshield NAV6 cerebral protection device was advanced into the cervical left ICA. However, multiple attempts  to advance a cerebral protection device through the left ICA kinking proved unsuccessful. The cerebral protection device was subsequently removed. Using biplane roadmap guidance, a 10-8 x 40 mm XACT carotid stent was navigated and deployed from the distal left common carotid artery to the proximal left internal carotid artery, proximal to the vessel kinking. Suboptimal stent expansion was noted. Then, a 6 x 30 mm Viatrac balloon was navigated into the recently deployed stent. Angioplasty was performed under fluoroscopy. Left internal carotid artery angiograms with frontal and lateral views of the neck showed adequate stent positioning and expansion with brisk anterograde flow. Left internal carotid artery angiograms with frontal and lateral views of the head showed improvement of  anterograde flow in the left MCA vascular tree with brisk anterograde flow. Delayed left common carotid artery angiograms with frontal and lateral views of the neck showed no evidence of clot formation within the stent. The catheter was subsequently Riddle. Right common femoral artery angiogram was obtained in right anterior oblique view. The puncture is at the level of the common femoral artery. The artery has normal caliber, adequate for closure device. The sheath was exchanged over the wire for an 8 French Angio-Seal which was utilized for access closure. Immediate hemostasis was achieved. IMPRESSION: 1. Successful mechanical thrombectomy for treatment of a proximal left M2/MCA anterior division branch occlusion achieving complete recanalization (TICI 3). 2. 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 PLAN: Continue cangrelor  infusion until patient is transitioned to oral dual antiplatelet therapy. Electronically Signed   By: Katyucia  de Macedo Rodrigues M.D.   On: 06/05/2023 13:16   CT ANGIO HEAD NECK W WO CM W PERF (CODE STROKE) Addendum Date: 06/05/2023 ADDENDUM REPORT: 06/05/2023 12:25 ADDENDUM: Please note, there is a dictation error within CTA neck impression #1, which should read: The common carotid and internal carotid arteries are patent within the neck. Atherosclerotic plaque bilaterally. Most notably, there is progressive atherosclerotic plaque about the left carotid bifurcation and within the proximal left ICA with resultant severe near occlusive stenosis of the proximal left ICA. Also of note, atherosclerotic plaque about the right carotid bifurcation results in a 40% stenosis at the right ICA origin. Electronically Signed   By: Rockey Childs D.O.   On: 06/05/2023 12:25   Result Date: 06/05/2023 CLINICAL DATA:  Provided history: Cerebrovascular accident,  unspecified mechanism. Right-sided weakness. Right-sided facial droop. Altered mental status. EXAM: CT ANGIOGRAPHY HEAD AND NECK CT PERFUSION BRAIN TECHNIQUE: Multidetector CT imaging of the head and neck was performed using the standard protocol during bolus administration of intravenous contrast. Multiplanar CT image reconstructions and MIPs were obtained to evaluate the vascular anatomy. Carotid stenosis measurements (when applicable) are obtained utilizing NASCET criteria, using the distal internal carotid diameter as the denominator. Multiphase CT imaging of the brain was performed following IV bolus contrast injection. Subsequent parametric perfusion maps were calculated using RAPID software. RADIATION DOSE REDUCTION: This exam was performed according to the departmental dose-optimization program which includes automated exposure control, adjustment of the mA and/or kV according to patient size and/or use of iterative reconstruction technique. CONTRAST:  OMNIPAQUE  IOHEXOL  350 MG/ML SOLN COMPARISON:  Noncontrast head CT performed earlier today 06/05/2023. MRA head and MRA neck 10/04/2019. FINDINGS: CTA NECK FINDINGS Aortic arch: Common origin of the innominate and left common carotid arteries. Atherosclerotic plaque within the visualized thoracic aorta and proximal major branch vessels of the neck. Streak/beam  hardening artifact arising from a dense right-sided contrast bolus partially obscures the right subclavian artery. Within this limitation, there is no appreciable hemodynamically significant innominate or proximal subclavian artery stenosis. Right carotid system: CCA and ICA patent within the neck. Atherosclerotic plaque, greatest about the carotid bifurcation. Resultant 40% stenosis at the ICA origin. Left carotid system: CCA and ICA patent within the neck. Atherosclerotic plaque. Most notably, there is prominent atherosclerotic plaque about the carotid bifurcation and within the proximal ICA which  has progressed from the prior MRA neck of 10/04/2019. Resultant severe (near occlusive) stenosis of the proximal ICA. Tortuosity of the cervical ICA Vertebral arteries: The vertebral arteries are patent within the neck. Streak/beam hardening artifact limits evaluation of the right vertebral artery origin. At least moderate stenosis is suspected at this site. Atherosclerotic plaque scattered elsewhere within the cervical right vertebral artery with no more than mild stenosis. Calcified atherosclerotic plaque at the left vertebral artery origin with suspected at least moderate stenosis. Nonstenotic calcified plaque elsewhere within the cervical left vertebral artery. Skeleton: Cervical spondylosis. Other neck: No neck mass or cervical lymphadenopathy. Upper chest: No consolidation within the imaged lung apices. Review of the MIP images confirms the above findings CTA HEAD FINDINGS Anterior circulation: The intracranial internal carotid arteries are patent. As sclerotic plaque within both vessels. No more than mild stenosis on the right. Up to moderate stenosis within the left cavernous segment. The M1 middle cerebral arteries are patent. Abrupt occlusion of a proximal M2 left middle cerebral artery vessel (series 11, image 22). Atherosclerotic irregularity of the M2 and more distal MCA vessels elsewhere. The anterior cerebral arteries are patent. Atherosclerotic irregularity of both vessels without high-grade proximal stenosis. A possible 2 mm periophthalmic left ICA aneurysm with better appreciated on the prior MRA head of 10/04/2019. Posterior circulation: The intracranial vertebral arteries are patent. Atherosclerotic plaque within the right V4 segment sites of mild stenosis. Non-stenotic atherosclerotic plaque within the left V4 segment. The basilar artery is patent. The posterior cerebral arteries are patent. Posterior communicating arteries are diminutive or absent, bilaterally. Venous sinuses: Assessment for  dural venous sinus thrombosis is limited due to contrast timing. Anatomic variants: As described. Review of the MIP images confirms the above findings CT Brain Perfusion Findings: ASPECTS: CBF (<30%) Volume: 41mL Perfusion (Tmax>6.0s) volume: 86mL Mismatch Volume: 45mL Infarction Location:Left MCA vascular territory CTA head impression #1, the CT perfusion head impression and the presence of a severe stenosis of the proximal cervical left ICA called by telephone at the time of interpretation on 06/05/2023 at 8:40 am to provider ERIC [facility] , who verbally acknowledged these results. IMPRESSION: CTA neck: 1. No common carotid and internal carotid arteries are patent within the neck. Atherosclerotic plaque bilaterally. Most notably, there is progressive atherosclerotic plaque about the left carotid bifurcation and within the proximal left ICA with resultant severe, near occlusive stenosis of the proximal left ICA. Also of note, atherosclerotic plaque about the right carotid bifurcation results in 40% stenosis at the right ICA origin. 2. The vertebral arteries are patent within the neck. Atherosclerotic plaque bilaterally as described. Most notably, there is suspected at least moderate stenoses at the bilateral vertebral artery origins. 3. Aortic Atherosclerosis (ICD10-I70.0). CTA head: 1. Abrupt occlusion of a proximal M2 left middle cerebral artery vessel. 2. Background intracranial atherosclerotic disease as described. 3. A possible 2 mm periophthalmic left ICA aneurysm was better appreciated on the prior MRA head of 10/04/2019. CT perfusion head: The perfusion software identifies a 41 mL core infarct  in the left MCA vascular territory. The perfusion software identifies an 86 mL region of critically hypoperfused parenchyma within the left MCA vascular territory (utilizing the Tmax>6 seconds threshold). Reported mismatch volume: 45 mL Electronically Signed: By: Rockey Childs D.O. On: 06/05/2023 09:07   CT HEAD CODE  STROKE WO CONTRAST Result Date: 06/05/2023 CLINICAL DATA:  Code stroke. Neuro deficit, acute, stroke suspected. EXAM: CT HEAD WITHOUT CONTRAST TECHNIQUE: Contiguous axial images were obtained from the base of the skull through the vertex without intravenous contrast. RADIATION DOSE REDUCTION: This exam was performed according to the departmental dose-optimization program which includes automated exposure control, adjustment of the mA and/or kV according to patient size and/or use of iterative reconstruction technique. COMPARISON:  Brain MRI 10/04/2019.  Noncontrast head CT 10/03/2019. FINDINGS: Brain: Generalized cerebral atrophy. Loss of gray-white differentiation consistent with an acute infarct within the left insula and within portions of the left frontal operculum (MCA vascular territory). Known small chronic cortically-based infarcts within the left frontal, left parietal and left occipital lobes were better appreciated on the prior brain MRI of 10/04/2019 (acute at that time). Mild patchy and ill-defined hypoattenuation within the cerebral white matter, nonspecific but compatible with chronic small vessel ischemic disease. Subcentimeter infarct within the superior right cerebellar hemisphere, new from the prior MRI but chronic in appearance. Loss of gray-white differentiation there is no acute intracranial hemorrhage. No extra-axial fluid collection. No evidence of an intracranial mass. No midline shift. Vascular: No hyperdense vessel.  Atherosclerotic calcifications. Skull: No calvarial fracture or aggressive osseous lesion. Sinuses/Orbits: No mass or acute finding within the imaged orbits. No significant paranasal sinus disease. ASPECTS [facility] Stroke Program Early CT Score) - Ganglionic level infarction (caudate, lentiform nuclei, internal capsule, insula, M1-M3 cortex): 5 - Supraganglionic infarction (M4-M6 cortex): 2 Total score (0-10 with 10 being normal): 7 Impression #1 called by telephone at the  time of interpretation on 06/05/2023 at 8:40 am to provider Dr. Merrianne, who verbally acknowledged these results. IMPRESSION: 1. Acute left MCA territory infarct affecting the left insula and portions of the left frontal operculum. ASPECTS is 7. 2. Known small chronic cortically-based infarcts within the left frontal, left parietal and left occipital lobes were better appreciated on the prior brain MRI of 10/04/2019 (acute at that time). 3. Background mild cerebral white matter chronic small vessel ischemic disease. 4. Subcentimeter infarct within the right cerebellar hemisphere, new from prior MRI but chronic in appearance. 5. Generalized cerebral atrophy. Electronically Signed   By: Rockey Childs D.O.   On: 06/05/2023 08:45     PHYSICAL EXAM  Temp:  [98.1 F (36.7 C)-100.2 F (37.9 C)] 98.5 F (36.9 C) (02/09 1153) Pulse Rate:  [70-96] 96 (02/09 1153) Resp:  [18-20] 18 (02/09 1153) BP: (124-157)/(62-74) 124/74 (02/09 1153) SpO2:  [95 %-100 %] 95 % (02/09 1153)  General - obese, well developed, in no apparent distress. Sitting up in chair  Cardiovascular - Regular rhythm and rate.  Neuro - awake, alert, eyes open, orientated to age, place only.    Severe dysarthria, but following following commands.   No gaze palsy, tracking bilaterally, blinking to visual threat bilaterally.  Right facial droop. Tongue midline Bilateral UEs 4/5, minimal right drift.  Bilaterally LEs 4/5.  Sensation symmetric to light touch. No gross ataxia  gait not tested.    ASSESSMENT/PLAN Mr. Shravan Salahuddin. is a 83 y.o. male with history of hypertension, diabetes, CKD 3, stroke admitted for right-sided weakness numbness, aphasia, right facial droop, left gaze  preference. No TNK given due to outside window.    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 status post IR with TICI3 and confluent HT, likely secondary to large vessel disease source versus cardiomyopathy with low  EF CT left MCA infarct CT head and neck left M2 occlusion, left ICA near occlusion, right ICA 43 stenosis, bilateral VA origin severe stenosis CTP 41/86 Status post IR with TICI3 and left ICA stenting MRI left MCA infarct at left insular and left frontal operculum, prominent and confluent petechial hemorrhagic transformation CT repeat 2/7 stable confluent petechial hemorrhage 2D Echo EF 30% LDL 107 HgbA1c 6.1 UDS negative Heparin  subcu for VTE prophylaxis aspirin  81 mg daily and clopidogrel  75 mg daily prior to admission, now on aspirin  and Plavix  post stenting. Patient will be counseled to be compliant with his antithrombotic medications Ongoing aggressive stroke risk factor management Therapy recommendations:  CIR vs SNF Disposition: Pending  History of stroke 10/08/2019 admitted for left MCA infarct due to right upper extremity weakness.  MRA head and neck showed left ICA 30% stenosis.  EF 45 to 50%.  LDL 105, A1c 5.6.  Recommended loop recorder at that time but only got 30-day CardioNet monitoring which was no A-fib.  Discharged on DAPT and Lipitor  80.  Cardiomyopathy CHF 09/2019 EF 45 to 50% Current admission EF 30% Cardiology on board, appreciate Dr. Mona assistance On DAPT Agree with GDMT - would like to start losartan , entresto, and spironolactone  as BP and Cr tolerates Metoprolol  succinate 25mg  daily Lasix  80mg  x1 May consider loop recorder placement to rule out A-fib  Diabetes HgbA1c 6.1 goal < 7.0 Controlled CBG monitoring SSI.  Add Semglee  3 units daily and continue to monitor DM education and close PCP follow up  Hypertension Stable BP goal less than 160 given hemorrhagic transformation Long term BP goal normotensive  Hyperlipidemia Home meds: Lipitor  80 LDL 105, goal < 70 Now on lipitor  80 and Zetia  Continue statin and at discharge  Dysphagia Poststroke dysphagia Speech on board Dysphagia 1 (Pureed) honey thick liquid. On core track-will DC core  track as he is eating 100% of meals.  DC tube feeds.  Other Stroke Risk Factors Advanced age Obesity, Body mass index is 41.94 kg/m.   Other Active Problems AKI on CKD3b, creatinine 1.60--1.66 -> 1.98>1.87 Hemoglobin 9.3 >9.7  Hospital day # 4  Eating all his meals.  DC core track and tube feeds.  Continue to monitor H&H.  Creatinine improving over time.  Total of 35 mins spent reviewing chart, discussion with patient and family on prognosis, Dx and plan. Discussed case with patient's nurse. Reviewed Imaging personally.   To contact Stroke Continuity provider, please refer to [email or web address]. After hours, contact General Neurology

## 2023-06-10 ENCOUNTER — Encounter (HOSPITAL_COMMUNITY): Payer: Self-pay | Admitting: Certified Registered"

## 2023-06-10 DIAGNOSIS — I5043 Acute on chronic combined systolic (congestive) and diastolic (congestive) heart failure: Secondary | ICD-10-CM

## 2023-06-10 DIAGNOSIS — I639 Cerebral infarction, unspecified: Secondary | ICD-10-CM | POA: Diagnosis not present

## 2023-06-10 DIAGNOSIS — N179 Acute kidney failure, unspecified: Secondary | ICD-10-CM | POA: Diagnosis not present

## 2023-06-10 DIAGNOSIS — I10 Essential (primary) hypertension: Secondary | ICD-10-CM | POA: Diagnosis not present

## 2023-06-10 LAB — BASIC METABOLIC PANEL
Anion gap: 10 (ref 5–15)
BUN: 43 mg/dL — ABNORMAL HIGH (ref 8–23)
CO2: 27 mmol/L (ref 22–32)
Calcium: 9.5 mg/dL (ref 8.9–10.3)
Chloride: 110 mmol/L (ref 98–111)
Creatinine, Ser: 1.78 mg/dL — ABNORMAL HIGH (ref 0.61–1.24)
GFR, Estimated: 37 mL/min — ABNORMAL LOW (ref >60)
Glucose, Bld: 128 mg/dL — ABNORMAL HIGH (ref 70–99)
Potassium: 4.3 mmol/L (ref 3.5–5.1)
Sodium: 147 mmol/L — ABNORMAL HIGH (ref 135–145)

## 2023-06-10 LAB — GLUCOSE, CAPILLARY
Glucose-Capillary: 124 mg/dL — ABNORMAL HIGH (ref 70–99)
Glucose-Capillary: 158 mg/dL — ABNORMAL HIGH (ref 70–99)
Glucose-Capillary: 209 mg/dL — ABNORMAL HIGH (ref 70–99)
Glucose-Capillary: 216 mg/dL — ABNORMAL HIGH (ref 70–99)
Glucose-Capillary: 227 mg/dL — ABNORMAL HIGH (ref 70–99)
Glucose-Capillary: 296 mg/dL — ABNORMAL HIGH (ref 70–99)
Glucose-Capillary: 300 mg/dL — ABNORMAL HIGH (ref 70–99)
Glucose-Capillary: 308 mg/dL — ABNORMAL HIGH (ref 70–99)

## 2023-06-10 LAB — CBC
HCT: 33 % — ABNORMAL LOW (ref 39.0–52.0)
Hemoglobin: 10.4 g/dL — ABNORMAL LOW (ref 13.0–17.0)
MCH: 30.9 pg (ref 26.0–34.0)
MCHC: 31.5 g/dL (ref 30.0–36.0)
MCV: 97.9 fL (ref 80.0–100.0)
Platelets: 165 10*3/uL (ref 150–400)
RBC: 3.37 MIL/uL — ABNORMAL LOW (ref 4.22–5.81)
RDW: 13.5 % (ref 11.5–15.5)
WBC: 8.2 10*3/uL (ref 4.0–10.5)
nRBC: 0 % (ref 0.0–0.2)

## 2023-06-10 MED ORDER — ISOSORBIDE DINITRATE 10 MG PO TABS
10.0000 mg | ORAL_TABLET | Freq: Three times a day (TID) | ORAL | Status: DC
  Administered 2023-06-10 – 2023-06-14 (×13): 10 mg via ORAL
  Filled 2023-06-10 (×16): qty 1

## 2023-06-10 MED ORDER — FUROSEMIDE 40 MG PO TABS
40.0000 mg | ORAL_TABLET | Freq: Every day | ORAL | Status: DC
  Administered 2023-06-11 – 2023-06-14 (×4): 40 mg via ORAL
  Filled 2023-06-10 (×4): qty 1

## 2023-06-10 MED ORDER — HYDRALAZINE HCL 10 MG PO TABS
10.0000 mg | ORAL_TABLET | Freq: Three times a day (TID) | ORAL | Status: DC
  Administered 2023-06-10 – 2023-06-14 (×12): 10 mg via ORAL
  Filled 2023-06-10 (×14): qty 1

## 2023-06-10 NOTE — Care Management Important Message (Signed)
 Important Message  Patient Details  Name: Nathan Proctor. MRN: [id number] Date of Birth: 13-Nov-1940   Important Message Given:  Yes - Medicare IM     Wynonia Hedges 06/10/2023, 3:34 PM

## 2023-06-10 NOTE — NC FL2 (Signed)
 [place]  MEDICAID FL2 LEVEL OF CARE FORM     IDENTIFICATION  Patient Name: Nathan Proctor. Birthdate: 04-May-1940 Sex: male Admission Date (Current Location): 06/05/2023  [facility] and [place] Number:  Producer, television/film/video and Address:  [facility] [place]. [facility], [address] [place], [place] [address]      Provider Number: [id number]  Attending Physician Name and Address:  Stroke, Md, MD  Relative Name and Phone Number:       Current Level of Care: Hospital Recommended Level of Care: Skilled Nursing Facility Prior Approval Number:    Date Approved/Denied:   PASRR Number: [id number]  Discharge Plan: SNF    Current Diagnoses: Patient Active Problem List   Diagnosis Date Noted   Acute systolic heart failure (HCC) 06/07/2023   AKI (acute kidney injury) (HCC) 06/07/2023   Malnutrition of moderate degree 06/06/2023   Pain due to onychomycosis of toenails of both feet 09/05/2022   Acute on chronic renal failure (HCC) 10/14/2019   Acute ischemic left MCA stroke (HCC) 10/07/2019   Morbid obesity (HCC)    NSVT (nonsustained ventricular tachycardia) (HCC)    Diabetic peripheral neuropathy (HCC)    Acute ischemic stroke (HCC) 10/04/2019   Hypoglycemia due to insulin  10/04/2019   CKD (chronic kidney disease), stage III (HCC) 10/04/2019   Dehydration 10/04/2019   Dyslipidemia 10/04/2019   Stroke (HCC) 10/04/2019   Ankle fracture, left 02/29/2016   Closed left ankle fracture 02/29/2016   Gout 02/20/2016   Essential hypertension 02/20/2016    Orientation RESPIRATION BLADDER Height & Weight     Self, Situation, Place  Normal Incontinent Weight: 292 lb 5.3 oz (132.6 kg) Height:     BEHAVIORAL SYMPTOMS/MOOD NEUROLOGICAL BOWEL NUTRITION STATUS      Incontinent Diet (See DC Summary)  AMBULATORY STATUS COMMUNICATION OF NEEDS Skin   Extensive Assist Verbally Surgical wounds, PU Stage and Appropriate Care (Closed incision on thigh)   PU Stage 2 Dressing:  Daily (left scrotum, foam dressing: lift every shift to assess)                   Personal Care Assistance Level of Assistance  Bathing, Feeding, Dressing Bathing Assistance: Maximum assistance Feeding assistance: Limited assistance Dressing Assistance: Maximum assistance     Functional Limitations Info  Speech     Speech Info: Impaired    SPECIAL CARE FACTORS FREQUENCY  PT (By licensed PT), OT (By licensed OT), Speech therapy     PT Frequency: 5x/week OT Frequency: 5x/week     Speech Therapy Frequency: 5x/wk      Contractures Contractures Info: Not present    Additional Factors Info  Code Status, Allergies, Insulin  Sliding Scale Code Status Info: Full Allergies Info: NKA   Insulin  Sliding Scale Info: See dc summary       Current Medications (06/10/2023):  This is the current hospital active medication list Current Facility-Administered Medications  Medication Dose Route Frequency Provider Last Rate Last Admin   acetaminophen  (TYLENOL ) tablet 650 mg  650 mg Oral Q4H PRN Lehner, Erin C, NP   650 mg at 06/09/23 2307   Or   acetaminophen  (TYLENOL ) 160 MG/5ML solution 650 mg  650 mg Per Tube Q4H PRN Lehner, Erin C, NP   650 mg at 06/06/23 2050   Or   acetaminophen  (TYLENOL ) suppository 650 mg  650 mg Rectal Q4H PRN Lehner, Erin C, NP       aspirin  chewable tablet 81 mg  81 mg Per Tube  Daily Lindzen, Eric, MD   81 mg at 06/10/23 0915   atorvastatin  (LIPITOR ) tablet 80 mg  80 mg Per Tube Daily Consuelo Denmark, MD   80 mg at 06/10/23 0915   Chlorhexidine  Gluconate Cloth 2 % PADS 6 each  6 each Topical Daily Kimberley Penman, MD   6 each at 06/09/23 1610   clopidogrel  (PLAVIX ) tablet 75 mg  75 mg Per Tube Daily Hazle Lites, MD   75 mg at 06/10/23 0915   ezetimibe  (ZETIA ) tablet 10 mg  10 mg Per Tube Daily Consuelo Denmark, MD   10 mg at 06/10/23 0915   furosemide  (LASIX ) injection 20 mg  20 mg Intravenous BID Mallipeddi, Vishnu P, MD   20 mg at 06/10/23 0745   heparin  injection  5,000 Units  5,000 Units Subcutaneous Q8H Xu, Jindong, MD   5,000 Units at 06/10/23 9604   hydrALAZINE  (APRESOLINE ) injection 10 mg  10 mg Intravenous Q4H PRN Lehner, Erin C, NP       insulin  aspart (novoLOG ) injection 0-9 Units  0-9 Units Subcutaneous Q4H Ruddy Corral, RPH   2 Units at 06/10/23 5409   insulin  glargine-yfgn (SEMGLEE ) injection 3 Units  3 Units Subcutaneous Daily Donalynn Fry, MD   3 Units at 06/09/23 8119   ipratropium-albuterol  (DUONEB) 0.5-2.5 (3) MG/3ML nebulizer solution 3 mL  3 mL Nebulization Q6H PRN Ogan, Okoronkwo U, MD       labetalol  (NORMODYNE ) injection 10 mg  10 mg Intravenous Q2H PRN Lehner, Erin C, NP       metoprolol  tartrate (LOPRESSOR ) tablet 12.5 mg  12.5 mg Per Tube BID Hazle Lites, MD   12.5 mg at 06/10/23 0915   polyethylene glycol (MIRALAX  / GLYCOLAX ) packet 17 g  17 g Oral Daily PRN Palikh, Gaurang M, MD   17 g at 06/09/23 1653   senna-docusate (Senokot-S) tablet 1 tablet  1 tablet Oral QHS PRN Lehner, Erin C, NP       sodium chloride  flush (NS) 0.9 % injection 3 mL  3 mL Intravenous Once Kingsley, Victoria K, DO       spironolactone  (ALDACTONE ) tablet 25 mg  25 mg Oral Daily Mallipeddi, Vishnu P, MD   25 mg at 06/10/23 0915     Discharge Medications: Please see discharge summary for a list of discharge medications.  Relevant Imaging Results:  Relevant Lab Results:   Additional Information SS # [id number]  Tandy Fam, LCSW

## 2023-06-10 NOTE — Consult Note (Signed)
 WOC Nurse Consult Note: Reason for Consult: Stage 2 Pressure injury; scrotum Wound type: partial thickness skin loss; Stage 2 posterior scrotum  Pressure Injury POA: Yes Measurement:1cm x 1cm x per nursing  Wound bed:60% pink/30% pink per nursing FS Drainage (amount, consistency, odor) scant drainage Periwound: intact  Dressing procedure/placement/frequency: Cleanse wound with saline, pat dry Cover area with single layer of xerform gauze, top with foam. Change every other day and PRN soilage.   Re consult if needed, will not follow at this time. Thanks  Graycen Degan [employer], RN,CWOCN, CNS, CWON-AP [phone])

## 2023-06-10 NOTE — Progress Notes (Signed)
 Rounding Note    Patient Name: Nathan Proctor. Date of Encounter: 06/10/2023  [facility] Cardiologist: Hazle Lites, MD   Subjective   Denies chest pain or shortness of breath.  Inpatient Medications    Scheduled Meds:  aspirin   81 mg Per Tube Daily   atorvastatin   80 mg Per Tube Daily   Chlorhexidine  Gluconate Cloth  6 each Topical Daily   clopidogrel   75 mg Per Tube Daily   ezetimibe   10 mg Per Tube Daily   [START ON 06/11/2023] furosemide   40 mg Oral Daily   heparin  injection (subcutaneous)  5,000 Units Subcutaneous Q8H   hydrALAZINE   10 mg Oral TID   insulin  aspart  0-9 Units Subcutaneous Q4H   insulin  glargine-yfgn  3 Units Subcutaneous Daily   isosorbide  dinitrate  10 mg Oral TID   metoprolol  tartrate  12.5 mg Per Tube BID   sodium chloride  flush  3 mL Intravenous Once   spironolactone   25 mg Oral Daily   Continuous Infusions:  PRN Meds: acetaminophen  **OR** acetaminophen  (TYLENOL ) oral liquid 160 mg/5 mL **OR** acetaminophen , hydrALAZINE , ipratropium-albuterol , labetalol , polyethylene glycol, senna-docusate   Vital Signs    Vitals:   06/10/23 0400 06/10/23 0750 06/10/23 1206 06/10/23 1633  BP: (!) 140/77 (!) 152/67 (!) 154/61 (!) 146/64  Pulse: 66 76 64 84  Resp: 16 16 18 18   Temp: 98.6 F (37 C) 98.7 F (37.1 C) 98.6 F (37 C) 98.6 F (37 C)  TempSrc: Oral Oral Oral Oral  SpO2: 99% 99% 100% 96%  Weight:        Intake/Output Summary (Last 24 hours) at 06/10/2023 1848 Last data filed at 06/10/2023 1636 Gross per 24 hour  Intake --  Output 1021 ml  Net -1021 ml      06/05/2023    8:00 AM 01/06/2023    5:07 PM 12/23/2019   11:30 AM  Last 3 Weights  Weight (lbs) 292 lb 5.3 oz 296 lb 15.4 oz 297 lb  Weight (kg) 132.6 kg 134.7 kg 134.718 kg      Telemetry    SR - Personally Reviewed  Physical Exam   GEN: No acute distress.   Neck: No JVD Cardiac: RRR, no murmurs, rubs, or gallops.  Respiratory: Clear to auscultation  bilaterally. GI: Soft, nontender, non-distended  MS: No edema; No deformity. Neuro:  not assessed, defer to neurology team Psych: Normal affect   New pertinent results (labs, ECG, imaging, cardiac studies)    Reviewed as below  Patient Profile     83 y.o. male with a past medical history of hypertension, type 2 diabetes, prior CVA who presented as a code stroke.  Cardiology is asked to see for acute on chronic systolic and diastolic heart failure.  He has been lost to cardiology follow-up since 2021  Assessment & Plan    Chronic systolic and diastolic heart failure, with question of acute exacerbation Acute kidney injury on chronic kidney disease stage 3a -Most recent echo shows LVEF of 30%, down from 45% in 2021 -Nuclear stress test in 2021 was concerning for prior infarct in inferior, inferolateral, and anteroseptal areas -Plan at that time was for cardiac catheterization, but patient did not present for this and was lost to follow-up -Given his nuclear stress test findings, it was presumed that this is likely an ischemic cardiomyopathy -given his acute CVA, he is not a candidate for coronary angiography at this time -He appears euvolemic on exam today.  His daily  weights are not charted.  It is questionable as to whether his ins and outs are accurate, and if they are he is net positive about 1.8 L since admission.  He is currently on IV Lasix  20 mg twice a day.  I cannot determine if he is having good urine output on this. Will change him to 40 mg oral lasix  tomorrow morning -His current guideline directed therapy includes spironolactone  and metoprolol  tartrate, which will need to be consolidated to succinate prior to discharge -His prior to admission creatinine appears to be around 1.5 at his baseline, consistent with CKD 3a His Cr this admission peaked at 2.03 and is now downtrending.  -In the short-term, given elevated blood pressures, will start low-dose hydralazine  and Isordil .   Would like to convert to Entresto if possible -Was on Jardiance  as an outpatient as well as losartan   CVA -On aspirin , clopidogrel , atorvastatin , and ezetimibe     Signed, Sheryle Donning, MD  06/10/2023, 6:48 PM

## 2023-06-10 NOTE — Progress Notes (Signed)
 Nutrition Follow-up  DOCUMENTATION CODES:   Non-severe (moderate) malnutrition in context of chronic illness  INTERVENTION:  Continue to encourage oral intake.  Advance diet as medically applicable. Magic cup TID with meals, each supplement provides 290 kcal and 9 grams of protein  Multivitamin with minerals    NUTRITION DIAGNOSIS:   Moderate Malnutrition related to chronic illness as evidenced by severe muscle depletion, mild fat depletion, severe fat depletion.    GOAL:   Patient will meet greater than or equal to 90% of their needs    MONITOR:   TF tolerance, Weight trends, Labs, Diet advancement  REASON FOR ASSESSMENT:   Consult Enteral/tube feeding initiation and management  ASSESSMENT:   Pt admitted for acute ischemic stroke with right sides weakness, right facial droop, and aphagia. Pt with PMH of HTN, DM, CKD III, prior CVA w/ no residual deficits. Unable to visit patient this day.  Nurse reached out with reports of good oral intake 06/09/23. Cortrak removed 06/09/2023. Meal intake 75-100%. Suspect with interventions listed above pt should be able to meet ENN Wt; 132.6 Kg (06/05/2023) , Labs; Na; 147,   NUTRITION - FOCUSED PHYSICAL EXAM:  Flowsheet Row Most Recent Value  Orbital Region Mild depletion  Upper Arm Region Severe depletion  Thoracic and Lumbar Region Mild depletion  Buccal Region Mild depletion  Temple Region Moderate depletion  Clavicle Bone Region Mild depletion  Clavicle and Acromion Bone Region Mild depletion  Scapular Bone Region Moderate depletion  Dorsal Hand Severe depletion  Patellar Region Severe depletion  Anterior Thigh Region Severe depletion  Posterior Calf Region Mild depletion  [edema present]  Edema (RD Assessment) Moderate  [BLE]  Hair Reviewed  Eyes Reviewed  Mouth Reviewed  Skin Reviewed  Nails Reviewed       Diet Order:   Diet Order             DIET - DYS 1 Fluid consistency: Honey Thick  Diet effective now                    EDUCATION NEEDS:   Education needs have been addressed  Skin:  Skin Assessment: Reviewed RN Assessment  Last BM:  none noted  Height:   Ht Readings from Last 1 Encounters:  01/06/23 5\' 10"  (1.778 m)    Weight:   Wt Readings from Last 1 Encounters:  06/05/23 132.6 kg    Ideal Body Weight:  68.2 kg  BMI:  Body mass index is 41.94 kg/m.  Estimated Nutritional Needs:   Kcal:  2300-2500  Protein:  115-130 g  Fluid:  2.4 L    Gevena Kugel RDN, LDN Clinical Dietitian   If unable to reach, please contact "RD Inpatient" secure chat group between 8 am-4 pm daily"

## 2023-06-10 NOTE — TOC Progression Note (Addendum)
 Transition of Care (TOC) - Progression Note    Patient Details  Name: Nathan Proctor. MRN: [id number] Date of Birth: 12-26-1940  Transition of Care [facility]) CM/SW Contact  Tandy Fam, [place] Phone Number: 06/10/2023, 10:45 AM  Clinical Narrative:   Patient no longer with cortrak. CSW completed referral and faxed out, awaiting bed offers.  UPDATE: CSW met with patient's spouse, Raguel Bunkers, and spoke with daughters Corbin Dess and Adolm Ahumada on the phone to provide bed offers and answer questions. Family to review SNF options and update CSW with choice.     Expected Discharge Plan: Skilled Nursing Facility Barriers to Discharge: Continued Medical Work up, English as a second language teacher, SNF Pending bed offer ([facility])  Expected Discharge Plan and Services In-house Referral: Clinical Social Work   Post Acute Care Choice: Skilled Nursing Facility Living arrangements for the past 2 months: Single Family Home                                       Social Determinants of Health (SDOH) Interventions SDOH Screenings   Food Insecurity: Patient Unable To Answer (06/07/2023)  Housing: Patient Unable To Answer (06/07/2023)  Transportation Needs: Patient Unable To Answer (06/07/2023)  Utilities: Patient Unable To Answer (06/07/2023)  Depression (PHQ2-9): Low Risk  (12/03/2019)  Social Connections: Patient Unable To Answer (06/07/2023)  Tobacco Use: Medium Risk (03/06/2023)    Readmission Risk Interventions     No data to display

## 2023-06-10 NOTE — Progress Notes (Signed)
 Physical Therapy Treatment Patient Details Name: Nathan Proctor. MRN: [id number] DOB: 07/05/1940 Today's Date: 06/10/2023   History of Present Illness 83 y.o. male who was brought in by EMS 06/05/23 as a code stroke due to acute onset of right-sided weakness, right facial droop and aphasia. CTH shows acute left MCA territory infarct; NIHSS 28; to IR for thrombectomy/stenting/angioplasty; PMH hypertension, diabetes, CKD, prior CVA with no residual deficits    PT Comments  Pt resting in bed on arrival and agreeable to session with steady progress towards acute goals, however gait progression limited due to bowel incontinence in hall. Pt requiring grossly min A for bed mobility, transfers and gait with RW for support. Pt able to maintain standing at sink for extended time for assist with peri-care. Current plan remains appropriate to address deficits and maximize functional independence and decrease caregiver burden. Pt continues to benefit from skilled PT services to progress toward functional mobility goals.      If plan is discharge home, recommend the following: Two people to help with walking and/or transfers;Direct supervision/assist for medications management;Direct supervision/assist for financial management;Assist for transportation;Help with stairs or ramp for entrance;Supervision due to cognitive status   Can travel by private vehicle     No  Equipment Recommendations  None recommended by PT    Recommendations for Other Services       Precautions / Restrictions Precautions Precautions: Fall Precaution Comments: incontinent bowel/bladder; SBP 120-160 Restrictions Weight Bearing Restrictions Per Provider Order: No     Mobility  Bed Mobility Overal bed mobility: Needs Assistance Bed Mobility: Supine to Sit, Sit to Supine     Supine to sit: Min assist Sit to supine: Mod assist   General bed mobility comments: min A to elevate trunk and scoot out to EOB, mod A to  return LEs to bed    Transfers Overall transfer level: Needs assistance Equipment used: Rolling walker (2 wheels) Transfers: Sit to/from Stand Sit to Stand: Min assist           General transfer comment: mod cues for hand placement with all transitions; intial lifting help to stand though improved to less support with subsequent sit to stands    Ambulation/Gait Ambulation/Gait assistance: Min assist Gait Distance (Feet): 30 Feet Assistive device: Rolling walker (2 wheels) Gait Pattern/deviations: Step-to pattern, Step-through pattern, Decreased stride length, Shuffle, Trunk flexed Gait velocity: decr     General Gait Details: slow pace, mildly flexed at hips distance limited due to bowel incontinence   Stairs             Wheelchair Mobility     Tilt Bed    Modified Rankin (Stroke Patients Only) Modified Rankin (Stroke Patients Only) Pre-Morbid Rankin Score: Moderate disability Modified Rankin: Moderately severe disability     Balance Overall balance assessment: Needs assistance Sitting-balance support: Feet supported Sitting balance-Leahy Scale: Fair Sitting balance - Comments: Pt able to maintain sitting balance without support statically but needs supervision when bending down to donn gripper socks   Standing balance support: Bilateral upper extremity supported Standing balance-Leahy Scale: Poor Standing balance comment: UE support for balance in standing                            Cognition Arousal: Alert Behavior During Therapy: Flat affect Overall Cognitive Status: Impaired/Different from baseline Area of Impairment: Attention, Safety/judgement, Following commands, Problem solving  Current Attention Level: Sustained   Following Commands: Follows one step commands with increased time Safety/Judgement: Decreased awareness of safety, Decreased awareness of deficits   Problem Solving: Slow processing, Requires  verbal cues, Requires tactile cues, Decreased initiation, Difficulty sequencing General Comments: following all simple single step mobility commands throughout session        Exercises      General Comments General comments (skin integrity, edema, etc.): VSS      Pertinent Vitals/Pain Pain Assessment Pain Assessment: No/denies pain    Home Living                          Prior Function            PT Goals (current goals can now be found in the care plan section) Acute Rehab PT Goals Patient Stated Goal: unable tp state PT Goal Formulation: Patient unable to participate in goal setting Time For Goal Achievement: 06/20/23 Progress towards PT goals: Progressing toward goals    Frequency    Min 4X/week      PT Plan      Co-evaluation              AM-PAC PT "6 Clicks" Mobility   Outcome Measure  Help needed turning from your back to your side while in a flat bed without using bedrails?: A Little Help needed moving from lying on your back to sitting on the side of a flat bed without using bedrails?: A Lot Help needed moving to and from a bed to a chair (including a wheelchair)?: A Little Help needed standing up from a chair using your arms (e.g., wheelchair or bedside chair)?: A Lot Help needed to walk in hospital room?: A Little Help needed climbing 3-5 steps with a railing? : Total 6 Click Score: 14    End of Session Equipment Utilized During Treatment: Gait belt Activity Tolerance: Patient tolerated treatment well Patient left: with call bell/phone within reach;with chair alarm set;in bed Nurse Communication: Mobility status PT Visit Diagnosis: Other abnormalities of gait and mobility (R26.89);Muscle weakness (generalized) (M62.81)     Time: 1610-9604 PT Time Calculation (min) (ACUTE ONLY): 25 min  Charges:    $Gait Training: 8-22 mins $Therapeutic Activity: 8-22 mins PT General Charges $$ ACUTE PT VISIT: 1 Visit                      Ashmi Blas R. PTA Acute Rehabilitation Services Office: [phone]   Agapito Horseman 06/10/2023, 4:35 PM

## 2023-06-10 NOTE — Progress Notes (Signed)
 STROKE TEAM PROGRESS NOTE   SUBJECTIVE (INTERVAL HISTORY)  Patient is sitting up in bed.  He is awake alert and interactive.  He has no complaints.  His daughter is at the bedside.  Also spoke to his wife and the other daughter over the phone and answered questions about his disposition and rehab needs.  Vital signs stable.  Neurological exam unchanged Hematocrit improving at 33 and creatinine at 1.78. OBJECTIVE Temp:  [98.6 F (37 C)-99 F (37.2 C)] 98.6 F (37 C) (02/10 1206) Pulse Rate:  [64-80] 64 (02/10 1206) Cardiac Rhythm: Normal sinus rhythm (02/10 0701) Resp:  [16-18] 18 (02/10 1206) BP: (130-154)/(61-79) 154/61 (02/10 1206) SpO2:  [96 %-100 %] 100 % (02/10 1206)  Recent Labs  Lab 06/10/23 0017 06/10/23 0407 06/10/23 0732 06/10/23 1208 06/10/23 1217  GLUCAP 209* 124* 158* 227* 216*   Recent Labs  Lab 06/05/23 1500 06/05/23 1811 06/06/23 0557 06/06/23 1648 06/07/23 0413 06/08/23 0556 06/09/23 0720 06/10/23 0613  NA  --   --  139  --  143 145 144 147*  K  --   --  5.0  --  4.2 4.2 4.2 4.3  CL  --   --  110  --  110 112* 113* 110  CO2  --   --  16*  --  20* 25 25 27   GLUCOSE  --   --  161*  --  163* 283* 290* 128*  BUN  --   --  23  --  31* 42* 42* 43*  CREATININE  --   --  1.66*  --  1.98* 2.03* 1.87* 1.78*  CALCIUM   --   --  8.7*  --  9.5 8.8* 9.0 9.5  MG 1.8 1.9 2.3 2.2  --   --   --   --   PHOS 2.5 1.9* 3.0 3.4  --   --   --   --    Recent Labs  Lab 06/05/23 0815 06/06/23 0557  AST 20 26  ALT 13 12  ALKPHOS 76 72  BILITOT 1.2 1.1  PROT 6.5 6.1*  ALBUMIN  3.6 3.3*   Recent Labs  Lab 06/05/23 0815 06/05/23 0817 06/06/23 0557 06/07/23 0413 06/08/23 0556 06/09/23 0720 06/10/23 0613  WBC 6.8  --  10.0 8.4 7.4 7.8 8.2  NEUTROABS 3.1  --   --   --   --   --   --   HGB 11.9*   < > 11.4* 11.8* 9.3* 9.7* 10.4*  HCT 37.8*   < > 36.3* 37.1* 29.6* 31.2* 33.0*  MCV 97.4  --  97.6 97.4 96.7 97.8 97.9  PLT 181  --  177 151 140* 144* 165   < > = values  in this interval not displayed.   No results for input(s): "CKTOTAL", "CKMB", "CKMBINDEX", "TROPONINI" in the last 168 hours. No results for input(s): "LABPROT", "INR" in the last 72 hours.  No results for input(s): "COLORURINE", "LABSPEC", "PHURINE", "GLUCOSEU", "HGBUR", "BILIRUBINUR", "KETONESUR", "PROTEINUR", "UROBILINOGEN", "NITRITE", "LEUKOCYTESUR" in the last 72 hours.  Invalid input(s): "APPERANCEUR"      Component Value Date/Time   CHOL 183 06/06/2023 0557   TRIG 81 06/06/2023 0557   HDL 60 06/06/2023 0557   CHOLHDL 3.1 06/06/2023 0557   VLDL 16 06/06/2023 0557   LDLCALC 107 (H) 06/06/2023 0557   LDLCALC 223 (H) 05/31/2021 0000   Lab Results  Component Value Date   HGBA1C 6.1 (H) 06/05/2023      Component Value Date/Time  LABOPIA NONE DETECTED 06/05/2023 1653   COCAINSCRNUR NONE DETECTED 06/05/2023 1653   LABBENZ NONE DETECTED 06/05/2023 1653   AMPHETMU NONE DETECTED 06/05/2023 1653   THCU NONE DETECTED 06/05/2023 1653   LABBARB NONE DETECTED 06/05/2023 1653    Recent Labs  Lab 06/05/23 0815  ETH <10    I have personally reviewed the radiological images below and agree with the radiology interpretations.  DG Abd 1 View Result Date: 06/09/2023 CLINICAL DATA:  [id number]. Encounter for feeding tube placement. EXAM: ABDOMEN - 1 VIEW COMPARISON:  Abdomen film 06/05/2023. FINDINGS: Dobbhoff feeding tube is well placed with the radiopaque tip in the distal stomach. The visualized bowel pattern is nonobstructive. There is barium newly seen in the flexures and transverse colon. There is no supine evidence of free air. There is atelectasis in the lung bases. Cardiomegaly. IMPRESSION: 1. Dobbhoff feeding tube well placed with the radiopaque tip in the distal stomach. 2. Nonobstructive bowel gas pattern. 3. Cardiomegaly. Electronically Signed   By: Denman Fischer M.D.   On: 06/09/2023 03:41   DG CHEST PORT 1 VIEW Result Date: 06/08/2023 CLINICAL DATA:  CHF EXAM: PORTABLE CHEST 1  VIEW COMPARISON:  01/06/2023 FINDINGS: Feeding tube in place. There is cardiomegaly with vascular congestion. Chronic elevation of the right hemidiaphragm with right base atelectasis. Interstitial prominence throughout the lungs, right greater than left could reflect interstitial edema. Possible small effusions. No acute bony abnormality. IMPRESSION: Cardiomegaly with vascular congestion and probable mild interstitial edema. Question small bilateral effusions. Chronic elevation of the right hemidiaphragm with right base atelectasis. Electronically Signed   By: Janeece Mechanic M.D.   On: 06/08/2023 18:20   DG Swallowing Func-Speech Pathology Result Date: 06/07/2023 Table formatting from the original result was not included. Modified Barium Swallow Study Study completed and documented by Aurelia Leeks, SLP Student Supervised and reviewed by Noble Bateman, MA CCC-SLP [facility] Secure Chat Preferred Office [phone] Patient Details Name: Nathan Proctor. MRN: [id number] Date of Birth: [birth year] Today's Date: 06/07/2023 HPI/PMH: HPI: Pt is a 83 y.o. male who was admitted as a code stroke on 02/05 due to acute onset of right-sided weakness, right facial droop and aphasia. MRI from 02/06 displayed an evolving acute left MCA infarct, most pronounced at the left insula and left frontal operculum. Pt has intentionally lost 300 pounds in the recent past, with pre-loss weight at ~600Ib. PMHx includes hypertension, diabetes, CKD, prior CVA. Clinical Impression: Clinical Impression: Pt presented with a moderate-severe oropharyngeal dysphagia (DIGEST Score: 3) primarily characterized by silent aspiration of thin liquid, silent penetration of nectar-thick liquid, diffuse pharyngeal residue collection during thin/nectar-thick consistencies, and posterior-escape of bolus across multiple consistencies. Pt positioning was a potential limitation for examination due to consistent posterior head position. More  anterior head positioning during nectar-thick liquid displayed less pharyngeal residue, increased hyo-laryngeal excursion, and overall better airway safety. This positioning was not present throughout due to pt's receptive language deficits to understand cues. Alternating between honey-thick and puree consistencies provided more oropharyngeal bolus clearance and no signs of aspiration. Recommend a dysphagia 1 (puree) and honey-thick liquid diet to optimize pt's airway safety and potential for meeting nutritional needs. Slow rate, small bites/sips, natural head posturing, and multiple swallows are compensatory strategies that displayed best efficiency with pt. F/u with SLP for family education, compensatory strategy training, and upgraded diet trials is recommended. Factors that may increase risk of adverse event in presence of aspiration Roderick Civatte & Jessy Morocco 2021): Factors that may increase risk of adverse event  in presence of aspiration Roderick Civatte & Jessy Morocco 2021): Weak cough Recommendations/Plan: Swallowing Evaluation Recommendations Swallowing Evaluation Recommendations Recommendations: PO diet PO Diet Recommendation: Dysphagia 1 (Pureed); Moderately thick liquids (Level 3, honey thick) Liquid Administration via: Spoon Medication Administration: Crushed with puree Supervision: Full supervision/cueing for swallowing strategies; Full assist for feeding Swallowing strategies  : Slow rate; Small bites/sips; Multiple dry swallows after each bite/sip (Pt needs to be in a neutral head position (no posterior head tilt).) Postural changes: Position pt fully upright for meals; Stay upright 30-60 min after meals Oral care recommendations: Oral care BID (2x/day); Staff/trained caregiver to provide oral care Caregiver Recommendations: Have oral suction available Treatment Plan Treatment Plan Treatment recommendations: Therapy as outlined in treatment plan below Follow-up recommendations: Follow physicians's recommendations for  discharge plan and follow up therapies Functional status assessment: Patient has had a recent decline in their functional status and demonstrates the ability to make significant improvements in function in a reasonable and predictable amount of time. Treatment frequency: Min 2x/week Treatment duration: 2 weeks Interventions: Aspiration precaution training; Compensatory techniques; Patient/family education; Diet toleration management by SLP; Trials of upgraded texture/liquids Recommendations Recommendations for follow up therapy are one component of a multi-disciplinary discharge planning process, led by the attending physician.  Recommendations may be updated based on patient status, additional functional criteria and insurance authorization. Assessment: Orofacial Exam: Orofacial Exam Oral Cavity - Dentition: Adequate natural dentition Orofacial Anatomy: WFL Anatomy: Anatomy: WFL Boluses Administered: Boluses Administered Boluses Administered: Thin liquids (Level 0); Mildly thick liquids (Level 2, nectar thick); Moderately thick liquids (Level 3, honey thick); Puree; Solid  Oral Impairment Domain: Oral Impairment Domain Lip Closure: Escape progressing to mid-chin Tongue control during bolus hold: Posterior escape of greater than half of bolus Bolus preparation/mastication: -- (Pt did not chew solid and SLP had to remove it from mouth.) Bolus transport/lingual motion: Repetitive/disorganized tongue motion Oral residue: Residue collection on oral structures Location of oral residue : Tongue; Palate Initiation of pharyngeal swallow : Pyriform sinuses  Pharyngeal Impairment Domain: Pharyngeal Impairment Domain Soft palate elevation: No bolus between soft palate (SP)/pharyngeal wall (PW) Laryngeal elevation: Partial superior movement of thyroid  cartilage/partial approximation of arytenoids to epiglottic petiole Anterior hyoid excursion: Partial anterior movement Epiglottic movement: Partial inversion Laryngeal vestibule  closure: Incomplete, narrow column air/contrast in laryngeal vestibule Pharyngeal stripping wave : Present - complete Pharyngeal contraction (A/P view only): N/A Pharyngoesophageal segment opening: Complete distension and complete duration, no obstruction of flow Tongue base retraction: Narrow column of contrast or air between tongue base and PPW Pharyngeal residue: Collection of residue within or on pharyngeal structures Location of pharyngeal residue: Diffuse (>3 areas)  Esophageal Impairment Domain: Esophageal Impairment Domain Esophageal clearance upright position: -- (Not tested.) Pill: No data recorded Penetration/Aspiration Scale Score: Penetration/Aspiration Scale Score 1.  Material does not enter airway: Puree; Moderately thick liquids (Level 3, honey thick) 3.  Material enters airway, remains ABOVE vocal cords and not ejected out: Mildly thick liquids (Level 2, nectar thick) 8.  Material enters airway, passes BELOW cords without attempt by patient to eject out (silent aspiration) : Thin liquids (Level 0) Compensatory Strategies: Compensatory Strategies Compensatory strategies: Yes Straw: Ineffective Multiple swallows: Effective (Required max cueing due to pt's language.) Effective Multiple Swallows: Puree; Moderately thick liquid (Level 3, honey thick); Mildly thick liquid (Level 2, nectar thick)   General Information: Caregiver present: No  Diet Prior to this Study: NPO   Temperature : Normal   Respiratory Status: WFL   Supplemental O2: None (Room air)  No data recorded Behavior/Cognition: Alert; Cooperative; Pleasant mood; Requires cueing Self-Feeding Abilities: Needs assist with self-feeding Baseline vocal quality/speech: Dysphonic Volitional Cough: Unable to elicit Volitional Swallow: Able to elicit Exam Limitations: Poor positioning Goal Planning: Prognosis for improved oropharyngeal function: Good Barriers to Reach Goals: Language deficits No data recorded No data recorded Consulted and agree with  results and recommendations: Pt unable/family or caregiver not available Pain: Pain Assessment Pain Assessment: No/denies pain Faces Pain Scale: 0 Facial Expression: 0 Body Movements: 0 Muscle Tension: 0 Compliance with ventilator (intubated pts.): N/A Vocalization (extubated pts.): 0 CPOT Total: 0 End of Session: Start Time:SLP Start Time (ACUTE ONLY): 0903 Stop Time: SLP Stop Time (ACUTE ONLY): 0930 Time Calculation:SLP Time Calculation (min) (ACUTE ONLY): 27 min Charges: SLP Evaluations $ SLP Speech Visit: 1 Visit SLP Evaluations $BSS Swallow: 1 Procedure $MBS Swallow: 1 Procedure $ SLP EVAL LANGUAGE/SOUND PRODUCTION: 1 Procedure $Swallowing Treatment: 1 Procedure SLP visit diagnosis: SLP Visit Diagnosis: Dysphagia, oropharyngeal phase (R13.12) Past Medical History: Past Medical History: Diagnosis Date  Anemia of chronic disease   Ankle fracture 02/15/2016  Cancer (HCC)   Prostate  Chronic constipation   Chronic kidney disease   stage III  Diabetes mellitus without complication (HCC)   type II   Diabetic peripheral neuropathy (HCC)   Failure to thrive (0-17)   Fracture of left lower leg   Gout   Hyperlipidemia   Hypertension   Morbid obesity (HCC)   Unstable gait  Past Surgical History: Past Surgical History: Procedure Laterality Date  IR CT HEAD LTD  06/05/2023  IR INTRAVSC STENT CERV CAROTID W/O EMB-PROT MOD SED INC ANGIO  06/05/2023  IR PERCUTANEOUS ART THROMBECTOMY/INFUSION INTRACRANIAL INC DIAG ANGIO  06/05/2023  IR US  GUIDE VASC ACCESS RIGHT  06/05/2023  ORIF ANKLE FRACTURE Left 02/29/2016  Procedure: OPEN REDUCTION INTERNAL FIXATION (ORIF) ANKLE FRACTURE;  Surgeon: Janeth Medicus, MD;  Location: [facility];  Service: Orthopedics;  Laterality: Left;  PROSTATE SURGERY    RADIOLOGY WITH ANESTHESIA N/A 06/05/2023  Procedure: IR WITH ANESTHESIA;  Surgeon: Radiologist, Medication, MD;  Location: [facility];  Service: Radiology;  Laterality: N/A; DeBlois, Hardin Leys 06/07/2023, 12:15 PM  CT HEAD WO CONTRAST ( ) Result  Date: 06/07/2023 CLINICAL DATA:  83 year old male status post code stroke presentation, left MCA M2 occlusion, with left MCA infarct with confluent petechial hemorrhage. EXAM: CT HEAD WITHOUT CONTRAST TECHNIQUE: Contiguous axial images were obtained from the base of the skull through the vertex without intravenous contrast. RADIATION DOSE REDUCTION: This exam was performed according to the departmental dose-optimization program which includes automated exposure control, adjustment of the mA and/or kV according to patient size and/or use of iterative reconstruction technique. COMPARISON:  Brain MRI yesterday.  Head CT 06/05/2023. FINDINGS: Brain: Confluent mixed density infarct in the left MCA middle division, epicenter at the insula and operculum. Confluent petechial hemorrhage on series 3, image 16 appears stable from the MRI. Superimposed trace subarachnoid blood is difficult to exclude by CT (series 3, image 13), but was not apparent on MRI. Stable mild mass effect on the left lateral ventricle with only trace rightward midline shift (series 3, image 16). No ventriculomegaly. Elsewhere gray-white differentiation is stable from the presentation CT. Basilar cisterns remain patent. Vascular: Resolved hyperdense left MCA since presentation. Calcified atherosclerosis at the skull base. Skull: Stable, intact. Sinuses/Orbits: Visualized paranasal sinuses and mastoids are stable and well aerated. Other: Left nasoenteric tube now in place. Stable orbit and scalp soft tissues. IMPRESSION: 1. Stable by CT confluent Left  MCA middle division infarct with petechial hemorrhage (Heidelberg classification 1b: HI2, confluent petechiae, no mass effect). Questionable trace superimposed SAH, but was not apparent on MRI. 2. Stable minimal intracranial mass effect. 3. No new intracranial abnormality. Electronically Signed   By: Marlise Simpers M.D.   On: 06/07/2023 05:37   MR BRAIN WO CONTRAST Result Date: 06/06/2023 CLINICAL DATA:  Follow-up  examination for stroke. EXAM: MRI HEAD WITHOUT CONTRAST TECHNIQUE: Multiplanar, multiecho pulse sequences of the brain and surrounding structures were obtained without intravenous contrast. COMPARISON:  Comparison made to multiple previous exams from 06/05/2023. FINDINGS: Brain: Examination degraded by motion artifact. Cerebral volume within normal limits for age. Patchy T2/FLAIR hyperintensity involving the periventricular deep white matter both cerebral hemispheres, consistent with chronic small vessel ischemic disease, mild in nature. Small remote infarct noted within the right cerebellum. Confluent restricted diffusion involving the left insula and left frontal operculum, consistent with evolving acute left MCA distribution infarct. Area of infarction measures up to approximately 6 cm in AP diameter. Prominent associated susceptibility artifact, consistent with petechial hemorrhage (series 7, image 61) ( Heidelberg classification 1b: HI2, confluent petechiae, no mass effect. No frank or organized hematoma evident by MRI. No significant regional mass effect. Few additional small foci of infarction noted within the left parieto-occipital region posteriorly (series 3, images 40, 29). Apparent diffusion signal along the right frontal parafalcine region felt to be consistent with artifact related to dural calcification. Note made of a few additional chronic micro hemorrhages within the left cerebellum and right thalamus. No mass lesion or midline shift. Ventricles normal size without hydrocephalus. No extra-axial fluid collection. Pituitary gland suprasellar region within normal limits. Vascular: Major intracranial vascular flow voids are maintained. Skull and upper cervical spine: Craniocervical junction within normal limits. Bone marrow signal intensity overall within normal limits. No scalp soft tissue abnormality. Sinuses/Orbits: Prior bilateral ocular lens replacement. Paranasal sinuses are clear. No mastoid  effusion. Other: None. IMPRESSION: 1. Evolving acute left MCA distribution infarct, most pronounced at the left insula and left frontal operculum. Prominent associated petechial hemorrhage without frank intraparenchymal hematoma (Heidelberg classification 1b: HI2, confluent petechiae, no mass effect). 2. Few additional small foci of acute infarction within the left parieto-occipital region posteriorly. 3. Underlying mild chronic microvascular ischemic disease with small remote right cerebellar infarct. Electronically Signed   By: Virgia Griffins M.D.   On: 06/06/2023 03:12   ECHOCARDIOGRAM COMPLETE Result Date: 06/05/2023    ECHOCARDIOGRAM REPORT   Patient Name:   Garl Krizek. Date of Exam: 06/05/2023 Medical Rec #:  [id number]              Height:       70.0 in Accession #:    [id number]             Weight:       292.3 lb Date of Birth:  [birth year]              BSA:          2.453 m Patient Age:    82 years               BP:           129/72 mmHg Patient Gender: M                      HR:           89 bpm. Exam Location:  Inpatient Procedure: 2D Echo, Color Doppler, Cardiac Doppler and  Intracardiac            Opacification Agent Indications:    Stroke I63.9  History:        Patient has prior history of Echocardiogram examinations, most                 recent 10/06/2019. Risk Factors:Diabetes, Hypertension and                 Dyslipidemia.  Sonographer:    Hersey Lorenzo RDCS Referring Phys: ERIN C LEHNER IMPRESSIONS  1. Left ventricular ejection fraction, by estimation, is 30%. The left ventricle has moderately decreased function. The left ventricle demonstrates global hypokinesis. There is mild left ventricular hypertrophy.  2. Right ventricular systolic function is normal. The right ventricular size is normal.  3. Trivial mitral valve regurgitation.  4. The aortic valve is tricuspid. Aortic valve regurgitation is not visualized.  5. The inferior vena cava is normal in size with greater than 50%  respiratory variability, suggesting right atrial pressure of 3 mmHg. Comparison(s): The left ventricular function is worsened. FINDINGS  Left Ventricle: Left ventricular ejection fraction, by estimation, is 30%. The left ventricle has moderately decreased function. The left ventricle demonstrates global hypokinesis. Definity  contrast agent was given IV to delineate the left ventricular endocardial borders. The left ventricular internal cavity size was normal in size. There is mild left ventricular hypertrophy. Right Ventricle: The right ventricular size is normal. Right vetricular wall thickness was not assessed. Right ventricular systolic function is normal. Left Atrium: Left atrial size was normal in size. Right Atrium: Right atrial size was normal in size. Pericardium: There is no evidence of pericardial effusion. Mitral Valve: There is mild thickening of the mitral valve leaflet(s). Mild to moderate mitral annular calcification. Trivial mitral valve regurgitation. Tricuspid Valve: The tricuspid valve is normal in structure. Tricuspid valve regurgitation is trivial. Aortic Valve: The aortic valve is tricuspid. Aortic valve regurgitation is not visualized. Pulmonic Valve: The pulmonic valve was normal in structure. Pulmonic valve regurgitation is not visualized. Aorta: The aortic root and ascending aorta are structurally normal, with no evidence of dilitation. Venous: The inferior vena cava is normal in size with greater than 50% respiratory variability, suggesting right atrial pressure of 3 mmHg. IAS/Shunts: The interatrial septum was not assessed.  LEFT VENTRICLE PLAX 2D LVIDd:         5.00 cm   Diastology LVIDs:         4.40 cm   LV e' lateral: 5.33 cm/s LV PW:         1.20 cm LV IVS:        1.10 cm LVOT diam:     2.30 cm LV SV:         55 LV SV Index:   23 LVOT Area:     4.15 cm  RIGHT VENTRICLE RV S prime:     14.40 cm/s LEFT ATRIUM         Index LA diam:    4.60 cm 1.88 cm/m  AORTIC VALVE LVOT Vmax:    65.50 cm/s LVOT Vmean:  45.200 cm/s LVOT VTI:    0.133 m  AORTA Ao Root diam: 3.00 cm Ao Asc diam:  3.50 cm  SHUNTS Systemic VTI:  0.13 m Systemic Diam: 2.30 cm Ola Berger MD Electronically signed by Ola Berger MD Signature Date/Time: 06/05/2023/9:33:38 PM    Final    DG Abd Portable 1V Result Date: 06/05/2023 CLINICAL DATA:  Feeding tube placement. EXAM: PORTABLE ABDOMEN - 1  VIEW COMPARISON:  None Available. FINDINGS: Distal tip of feeding tube is seen in expected position of distal stomach. IMPRESSION: Distal tip of feeding tube seen in expected position of distal stomach. Electronically Signed   By: Rosalene Colon M.D.   On: 06/05/2023 15:49   CT HEAD WO CONTRAST Result Date: 06/05/2023 CLINICAL DATA:  Stroke, follow-up. Status post intracranial mechanical thrombectomy and stenting of the left ICA bifurcation. EXAM: CT HEAD WITHOUT CONTRAST TECHNIQUE: Contiguous axial images were obtained from the base of the skull through the vertex without intravenous contrast. RADIATION DOSE REDUCTION: This exam was performed according to the departmental dose-optimization program which includes automated exposure control, adjustment of the mA and/or kV according to patient size and/or use of iterative reconstruction technique. COMPARISON:  CT head without contrast and CT angio head and neck 06/05/2023. By plain CT 06/05/2023. FINDINGS: Brain: The study is mildly degraded by patient motion. The left insular and left opercular infarct is somewhat obscured by patient motion. The cortex is slightly hyperdense, potentially reflecting reperfusion. No hemorrhage is present. Basal ganglia are intact. Subcortical white matter hypoattenuation in the high left frontal lobe is new. Mild right-sided white matter disease is stable. Basal ganglia are intact. A remote lacunar infarct is again noted in the right cerebellum. The brainstem and cerebellum are otherwise within normal limits. Vascular: Atherosclerotic calcifications are present  within the cavernous internal carotid arteries and at the normal origin of both vertebral arteries. No hyperdense vessel is present. Skull: Calvarium is intact. No focal lytic or blastic lesions are present. No significant extracranial soft tissue lesion is present. Sinuses/Orbits: The paranasal sinuses and mastoid air cells are clear. Bilateral lens replacements are noted. Globes and orbits are otherwise unremarkable. IMPRESSION: 1. The left insular and left opercular infarct is somewhat obscured by patient motion. 2. The cortex is slightly hyperdense, potentially reflecting reperfusion. 3. No hemorrhage. 4. Subcortical white matter hypoattenuation in the high left frontal lobe is new. This may reflect ischemic changes or edema related to the reperfusion. 5. Remote lacunar infarct of the right cerebellum. 6. Stable mild right-sided white matter disease. This likely reflects the sequela of chronic microvascular ischemia. Electronically Signed   By: Audree Leas M.D.   On: 06/05/2023 14:38   IR PERCUTANEOUS ART THROMBECTOMY/INFUSION INTRACRANIAL INC DIAG ANGIO Result Date: 06/05/2023 INDICATION: 83 year old male presenting with right-sided weakness and aphasia; NIHSS 28. His last known well was 10 p.m. on 06/04/2023. His past medical history significant for prior stroke, hypertension, diabetes and chronic kidney disease; baseline modified Rankin scale 0. Head CT showed hypodensity within the left insula, basal ganglia and left frontal operculum (ASPECTS 7). No IV thrombolytic given as patient was outside the window. CT angiogram of the head and neck showed an occlusion of a left M2/MCA anterior division branch. CT perfusion showed a 41 mL core infarct with a 45 mL ischemic penumbra. She was transferred to our service for mechanical thrombectomy. EXAM: ULTRASOUND-GUIDED VASCULAR ACCESS DIAGNOSTIC CEREBRAL ANGIOGRAM MECHANICAL THROMBECTOMY FLAT PANEL HEAD CT LEFT CAROTID STENTING AND ANGIOPLASTY WITHOUT  CEREBRAL PROTECTION DEVICE COMPARISON:  CT/CT angiogram of the head and neck June 05, 2023. MEDICATIONS: No antibiotics administered. ANESTHESIA/SEDATION: The procedure was performed under general anesthesia. CONTRAST:  80 mL of Omnipaque  300 milligram/mL FLUOROSCOPY: Radiation Exposure Index (as provided by the fluoroscopic device): 1315 mGy Kerma COMPLICATIONS: None immediate. TECHNIQUE: Informed written consent was obtained from the patient's wife after a thorough discussion of the procedural risks, benefits and alternatives. All questions were addressed. Maximal  Sterile Barrier Technique was utilized including caps, mask, sterile gowns, sterile gloves, sterile drape, hand hygiene and skin antiseptic. A timeout was performed prior to the initiation of the procedure. The right groin was prepped and draped in the usual sterile fashion. Using a micropuncture kit and the modified Seldinger technique, access was gained to the right common femoral artery and an 8 French sheath was placed. Real-time ultrasound guidance was utilized for vascular access including the acquisition of a permanent ultrasound image documenting patency of the accessed vessel. Under fluoroscopy, an 8 [place] Walrus balloon guide catheter was navigated over a 6 [place] VTK catheter and a 0.035" Terumo Glidewire into the aortic arch. The catheter was placed into the left common carotid artery and then advanced into the left internal carotid artery. The diagnostic catheter was removed. Frontal and lateral angiograms of the head were obtained. FINDINGS: 1. Ultrasound showed heavily calcified right common femoral artery is maintained patency and caliber. 2. Proximal occlusion of a left M2/MCA anterior division branch. 3. Atherosclerotic changes of the intracranial left ICA with mild stenosis at the distal cavernous segment. 4. 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 PROCEDURE: Using biplane roadmap guidance, a Red 62 aspiration catheter was navigated over Colossus 35 microguidewire into the cavernous segment of the left ICA. The aspiration catheter was then advanced to the level of occlusion and connected to an aspiration pump. Continuous aspiration was performed for 2 minutes. The guide catheter was connected to a VacLok syringe and the guiding catheter balloon was inflated. The aspiration catheter was subsequently removed under constant aspiration. The guide catheter was aspirated for debris. Left internal carotid artery angiograms with frontal and lateral views of the head showed complete recanalization of the left MCA vascular tree. The guide catheter was retracted into the neck. Frontal and lateral angiograms of the neck were obtained. Improvement of the degree of stenosis in the carotid bulb compared to prior CT angiogram. There is prominent luminal irregularity at the carotid bulb residual moderate stenosis. Increased tortuosity of the proximal/mid cervical left ICA with kinking. Left internal carotid artery angiograms with left anterior oblique views of the neck showed evidence of filling defects at the level of the carotid bulb. Flat panel CT of the head was obtained and post processed in a separate workstation with concurrent attending physician supervision. Selected images were sent to PACS. No evidence of hemorrhagic complication. There is mild contrast staining of the left insular and frontal cortex. Repeat left internal carotid artery angiograms with frontal and lateral views of the head showed Amy seen left M3/MCA branch to the left parietal region. Left common carotid artery angiograms with frontal and lateral views of the neck showed vertebra Gretchen of stenosis at the left ICA bulb with more prominent filling defect. At this point, patient was loaded on cangrelor  followed by continuous drip. Using biplane roadmap guidance, a 4-7 mm  Emboshield NAV6 cerebral protection device was advanced into the cervical left ICA. However, multiple attempts to advance a cerebral protection device through the left ICA kinking proved unsuccessful. The cerebral protection device was subsequently removed. Using biplane roadmap guidance, a 10-8 x 40 mm XACT carotid stent was navigated and deployed from the distal left common carotid artery to the proximal left internal carotid artery, proximal to the vessel kinking. Suboptimal stent expansion was noted. Then, a 6 x 30 mm Viatrac balloon was navigated into the recently deployed  stent. Angioplasty was performed under fluoroscopy. Left internal carotid artery angiograms with frontal and lateral views of the neck showed adequate stent positioning and expansion with brisk anterograde flow. Left internal carotid artery angiograms with frontal and lateral views of the head showed improvement of anterograde flow in the left MCA vascular tree with brisk anterograde flow. Delayed left common carotid artery angiograms with frontal and lateral views of the neck showed no evidence of clot formation within the stent. The catheter was subsequently Riddle. Right common femoral artery angiogram was obtained in right anterior oblique view. The puncture is at the level of the common femoral artery. The artery has normal caliber, adequate for closure device. The sheath was exchanged over the wire for an 8 [place] Angio-Seal which was utilized for access closure. Immediate hemostasis was achieved. IMPRESSION: 1. Successful mechanical thrombectomy for treatment of a proximal left M2/MCA anterior division branch occlusion achieving complete recanalization (TICI 3). 2. 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 PLAN: Continue cangrelor  infusion until patient is transitioned to oral dual  antiplatelet therapy. Electronically Signed   By: Katyucia  de Macedo Rodrigues M.D.   On: 06/05/2023 13:16   IR US  Guide Vasc Access Right Result Date: 06/05/2023 INDICATION: 83 year old male presenting with right-sided weakness and aphasia; NIHSS 28. His last known well was 10 p.m. on 06/04/2023. His past medical history significant for prior stroke, hypertension, diabetes and chronic kidney disease; baseline modified Rankin scale 0. Head CT showed hypodensity within the left insula, basal ganglia and left frontal operculum (ASPECTS 7). No IV thrombolytic given as patient was outside the window. CT angiogram of the head and neck showed an occlusion of a left M2/MCA anterior division branch. CT perfusion showed a 41 mL core infarct with a 45 mL ischemic penumbra. She was transferred to our service for mechanical thrombectomy. EXAM: ULTRASOUND-GUIDED VASCULAR ACCESS DIAGNOSTIC CEREBRAL ANGIOGRAM MECHANICAL THROMBECTOMY FLAT PANEL HEAD CT LEFT CAROTID STENTING AND ANGIOPLASTY WITHOUT CEREBRAL PROTECTION DEVICE COMPARISON:  CT/CT angiogram of the head and neck June 05, 2023. MEDICATIONS: No antibiotics administered. ANESTHESIA/SEDATION: The procedure was performed under general anesthesia. CONTRAST:  80 mL of Omnipaque  300 milligram/mL FLUOROSCOPY: Radiation Exposure Index (as provided by the fluoroscopic device): 1315 mGy Kerma COMPLICATIONS: None immediate. TECHNIQUE: Informed written consent was obtained from the patient's wife after a thorough discussion of the procedural risks, benefits and alternatives. All questions were addressed. Maximal Sterile Barrier Technique was utilized including caps, mask, sterile gowns, sterile gloves, sterile drape, hand hygiene and skin antiseptic. A timeout was performed prior to the initiation of the procedure. The right groin was prepped and draped in the usual sterile fashion. Using a micropuncture kit and the modified Seldinger technique, access was gained to the right  common femoral artery and an 8 French sheath was placed. Real-time ultrasound guidance was utilized for vascular access including the acquisition of a permanent ultrasound image documenting patency of the accessed vessel. Under fluoroscopy, an 8 [place] Walrus balloon guide catheter was navigated over a 6 [place] VTK catheter and a 0.035" Terumo Glidewire into the aortic arch. The catheter was placed into the left common carotid artery and then advanced into the left internal carotid artery. The diagnostic catheter was removed. Frontal and lateral angiograms of the head were obtained. FINDINGS: 1. Ultrasound showed heavily calcified right common femoral artery is maintained patency and caliber. 2. Proximal occlusion of a left M2/MCA anterior  division branch. 3. Atherosclerotic changes of the intracranial left ICA with mild stenosis at the distal cavernous segment. 4. 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 PROCEDURE: Using biplane roadmap guidance, a Red 62 aspiration catheter was navigated over Colossus 35 microguidewire into the cavernous segment of the left ICA. The aspiration catheter was then advanced to the level of occlusion and connected to an aspiration pump. Continuous aspiration was performed for 2 minutes. The guide catheter was connected to a VacLok syringe and the guiding catheter balloon was inflated. The aspiration catheter was subsequently removed under constant aspiration. The guide catheter was aspirated for debris. Left internal carotid artery angiograms with frontal and lateral views of the head showed complete recanalization of the left MCA vascular tree. The guide catheter was retracted into the neck. Frontal and lateral angiograms of the neck were obtained. Improvement of the degree of stenosis in the carotid bulb compared to prior CT angiogram. There is prominent luminal irregularity at  the carotid bulb residual moderate stenosis. Increased tortuosity of the proximal/mid cervical left ICA with kinking. Left internal carotid artery angiograms with left anterior oblique views of the neck showed evidence of filling defects at the level of the carotid bulb. Flat panel CT of the head was obtained and post processed in a separate workstation with concurrent attending physician supervision. Selected images were sent to PACS. No evidence of hemorrhagic complication. There is mild contrast staining of the left insular and frontal cortex. Repeat left internal carotid artery angiograms with frontal and lateral views of the head showed Amy seen left M3/MCA branch to the left parietal region. Left common carotid artery angiograms with frontal and lateral views of the neck showed vertebra Gretchen of stenosis at the left ICA bulb with more prominent filling defect. At this point, patient was loaded on cangrelor  followed by continuous drip. Using biplane roadmap guidance, a 4-7 mm Emboshield NAV6 cerebral protection device was advanced into the cervical left ICA. However, multiple attempts to advance a cerebral protection device through the left ICA kinking proved unsuccessful. The cerebral protection device was subsequently removed. Using biplane roadmap guidance, a 10-8 x 40 mm XACT carotid stent was navigated and deployed from the distal left common carotid artery to the proximal left internal carotid artery, proximal to the vessel kinking. Suboptimal stent expansion was noted. Then, a 6 x 30 mm Viatrac balloon was navigated into the recently deployed stent. Angioplasty was performed under fluoroscopy. Left internal carotid artery angiograms with frontal and lateral views of the neck showed adequate stent positioning and expansion with brisk anterograde flow. Left internal carotid artery angiograms with frontal and lateral views of the head showed improvement of anterograde flow in the left MCA vascular tree  with brisk anterograde flow. Delayed left common carotid artery angiograms with frontal and lateral views of the neck showed no evidence of clot formation within the stent. The catheter was subsequently Riddle. Right common femoral artery angiogram was obtained in right anterior oblique view. The puncture is at the level of the common femoral artery. The artery has normal caliber, adequate for closure device. The sheath was exchanged over the wire for an 8 [place] Angio-Seal which was utilized for access closure. Immediate hemostasis was achieved. IMPRESSION: 1. Successful mechanical thrombectomy for treatment of a proximal left M2/MCA anterior division branch occlusion achieving complete recanalization (TICI 3). 2. Atherosclerotic disease of the left carotid bifurcation with stenosis and  clot formation suggesting acute plaque rupture treated with stenting and angioplasty with resolution of stenosis. PLAN: Continue cangrelor  infusion until patient is transitioned to oral dual antiplatelet therapy. Electronically Signed   By: Katyucia  de Macedo Rodrigues M.D.   On: 06/05/2023 13:16   IR INTRAVSC STENT CERV CAROTID W/O EMB-PROT MOD SED Result Date: 06/05/2023 INDICATION: 83 year old male presenting with right-sided weakness and aphasia; NIHSS 28. His last known well was 10 p.m. on 06/04/2023. His past medical history significant for prior stroke, hypertension, diabetes and chronic kidney disease; baseline modified Rankin scale 0. Head CT showed hypodensity within the left insula, basal ganglia and left frontal operculum (ASPECTS 7). No IV thrombolytic given as patient was outside the window. CT angiogram of the head and neck showed an occlusion of a left M2/MCA anterior division branch. CT perfusion showed a 41 mL core infarct with a 45 mL ischemic penumbra. She was transferred to our service for mechanical thrombectomy. EXAM: ULTRASOUND-GUIDED VASCULAR ACCESS DIAGNOSTIC CEREBRAL ANGIOGRAM MECHANICAL THROMBECTOMY  FLAT PANEL HEAD CT LEFT CAROTID STENTING AND ANGIOPLASTY WITHOUT CEREBRAL PROTECTION DEVICE COMPARISON:  CT/CT angiogram of the head and neck June 05, 2023. MEDICATIONS: No antibiotics administered. ANESTHESIA/SEDATION: The procedure was performed under general anesthesia. CONTRAST:  80 mL of Omnipaque  300 milligram/mL FLUOROSCOPY: Radiation Exposure Index (as provided by the fluoroscopic device): 1315 mGy Kerma COMPLICATIONS: None immediate. TECHNIQUE: Informed written consent was obtained from the patient's wife after a thorough discussion of the procedural risks, benefits and alternatives. All questions were addressed. Maximal Sterile Barrier Technique was utilized including caps, mask, sterile gowns, sterile gloves, sterile drape, hand hygiene and skin antiseptic. A timeout was performed prior to the initiation of the procedure. The right groin was prepped and draped in the usual sterile fashion. Using a micropuncture kit and the modified Seldinger technique, access was gained to the right common femoral artery and an 8 French sheath was placed. Real-time ultrasound guidance was utilized for vascular access including the acquisition of a permanent ultrasound image documenting patency of the accessed vessel. Under fluoroscopy, an 8 [place] Walrus balloon guide catheter was navigated over a 6 [place] VTK catheter and a 0.035" Terumo Glidewire into the aortic arch. The catheter was placed into the left common carotid artery and then advanced into the left internal carotid artery. The diagnostic catheter was removed. Frontal and lateral angiograms of the head were obtained. FINDINGS: 1. Ultrasound showed heavily calcified right common femoral artery is maintained patency and caliber. 2. Proximal occlusion of a left M2/MCA anterior division branch. 3. Atherosclerotic changes of the intracranial left ICA with mild stenosis at the distal cavernous segment. 4. A 2-3 mm laterally projecting saccular aneurysm of the  cavernous segment of the left ICA (extradural), similar to prior MR angiogram performed 2021. PROCEDURE: Using biplane roadmap guidance, a Red 62 aspiration catheter was navigated over Colossus 35 microguidewire into the cavernous segment of the left ICA. The aspiration catheter was then advanced to the level of occlusion and connected to an aspiration pump. Continuous aspiration was performed for 2 minutes. The guide catheter was connected to a VacLok syringe and the guiding catheter balloon was inflated. The aspiration catheter was subsequently removed under constant aspiration. The guide catheter was aspirated for debris. Left internal carotid artery angiograms with frontal and lateral views of the head showed complete recanalization of the left MCA vascular tree. The guide catheter was retracted into the neck. Frontal and lateral angiograms of the neck were obtained. Improvement of the degree of stenosis in  the carotid bulb compared to prior CT angiogram. There is prominent luminal irregularity at the carotid bulb residual moderate stenosis. Increased tortuosity of the proximal/mid cervical left ICA with kinking. Left internal carotid artery angiograms with left anterior oblique views of the neck showed evidence of filling defects at the level of the carotid bulb. Flat panel CT of the head was obtained and post processed in a separate workstation with concurrent attending physician supervision. Selected images were sent to PACS. No evidence of hemorrhagic complication. There is mild contrast staining of the left insular and frontal cortex. Repeat left internal carotid artery angiograms with frontal and lateral views of the head showed Amy seen left M3/MCA branch to the left parietal region. Left common carotid artery angiograms with frontal and lateral views of the neck showed vertebra Gretchen of stenosis at the left ICA bulb with more prominent filling defect. At this point, patient was loaded on cangrelor   followed by continuous drip. Using biplane roadmap guidance, a 4-7 mm Emboshield NAV6 cerebral protection device was advanced into the cervical left ICA. However, multiple attempts to advance a cerebral protection device through the left ICA kinking proved unsuccessful. The cerebral protection device was subsequently removed. Using biplane roadmap guidance, a 10-8 x 40 mm XACT carotid stent was navigated and deployed from the distal left common carotid artery to the proximal left internal carotid artery, proximal to the vessel kinking. Suboptimal stent expansion was noted. Then, a 6 x 30 mm Viatrac balloon was navigated into the recently deployed stent. Angioplasty was performed under fluoroscopy. Left internal carotid artery angiograms with frontal and lateral views of the neck showed adequate stent positioning and expansion with brisk anterograde flow. Left internal carotid artery angiograms with frontal and lateral views of the head showed improvement of anterograde flow in the left MCA vascular tree with brisk anterograde flow. Delayed left common carotid artery angiograms with frontal and lateral views of the neck showed no evidence of clot formation within the stent. The catheter was subsequently Riddle. Right common femoral artery angiogram was obtained in right anterior oblique view. The puncture is at the level of the common femoral artery. The artery has normal caliber, adequate for closure device. The sheath was exchanged over the wire for an 8 [place] Angio-Seal which was utilized for access closure. Immediate hemostasis was achieved. IMPRESSION: 1. Successful mechanical thrombectomy for treatment of a proximal left M2/MCA anterior division branch occlusion achieving complete recanalization (TICI 3). 2. 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 PLAN: Continue  cangrelor  infusion until patient is transitioned to oral dual antiplatelet therapy. Electronically Signed   By: Katyucia  de Macedo Rodrigues M.D.   On: 06/05/2023 13:16   IR CT Head Ltd Result Date: 06/05/2023 INDICATION: 83 year old male presenting with right-sided weakness and aphasia; NIHSS 28. His last known well was 10 p.m. on 06/04/2023. His past medical history significant for prior stroke, hypertension, diabetes and chronic kidney disease; baseline modified Rankin scale 0. Head CT showed hypodensity within the left insula, basal ganglia and left frontal operculum (ASPECTS 7). No IV thrombolytic given as patient was outside the window. CT angiogram of the head and neck showed an occlusion of a left M2/MCA anterior division branch. CT perfusion showed a 41 mL core infarct with a 45 mL ischemic penumbra. She was transferred to our service for mechanical thrombectomy. EXAM: ULTRASOUND-GUIDED VASCULAR ACCESS DIAGNOSTIC CEREBRAL ANGIOGRAM MECHANICAL THROMBECTOMY FLAT  PANEL HEAD CT LEFT CAROTID STENTING AND ANGIOPLASTY WITHOUT CEREBRAL PROTECTION DEVICE COMPARISON:  CT/CT angiogram of the head and neck June 05, 2023. MEDICATIONS: No antibiotics administered. ANESTHESIA/SEDATION: The procedure was performed under general anesthesia. CONTRAST:  80 mL of Omnipaque  300 milligram/mL FLUOROSCOPY: Radiation Exposure Index (as provided by the fluoroscopic device): 1315 mGy Kerma COMPLICATIONS: None immediate. TECHNIQUE: Informed written consent was obtained from the patient's wife after a thorough discussion of the procedural risks, benefits and alternatives. All questions were addressed. Maximal Sterile Barrier Technique was utilized including caps, mask, sterile gowns, sterile gloves, sterile drape, hand hygiene and skin antiseptic. A timeout was performed prior to the initiation of the procedure. The right groin was prepped and draped in the usual sterile fashion. Using a micropuncture kit and the modified  Seldinger technique, access was gained to the right common femoral artery and an 8 French sheath was placed. Real-time ultrasound guidance was utilized for vascular access including the acquisition of a permanent ultrasound image documenting patency of the accessed vessel. Under fluoroscopy, an 8 [place] Walrus balloon guide catheter was navigated over a 6 [place] VTK catheter and a 0.035" Terumo Glidewire into the aortic arch. The catheter was placed into the left common carotid artery and then advanced into the left internal carotid artery. The diagnostic catheter was removed. Frontal and lateral angiograms of the head were obtained. FINDINGS: 1. Ultrasound showed heavily calcified right common femoral artery is maintained patency and caliber. 2. Proximal occlusion of a left M2/MCA anterior division branch. 3. Atherosclerotic changes of the intracranial left ICA with mild stenosis at the distal cavernous segment. 4. 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 PROCEDURE: Using biplane roadmap guidance, a Red 62 aspiration catheter was navigated over Colossus 35 microguidewire into the cavernous segment of the left ICA. The aspiration catheter was then advanced to the level of occlusion and connected to an aspiration pump. Continuous aspiration was performed for 2 minutes. The guide catheter was connected to a VacLok syringe and the guiding catheter balloon was inflated. The aspiration catheter was subsequently removed under constant aspiration. The guide catheter was aspirated for debris. Left internal carotid artery angiograms with frontal and lateral views of the head showed complete recanalization of the left MCA vascular tree. The guide catheter was retracted into the neck. Frontal and lateral angiograms of the neck were obtained. Improvement of the degree of stenosis in the carotid bulb compared to prior CT  angiogram. There is prominent luminal irregularity at the carotid bulb residual moderate stenosis. Increased tortuosity of the proximal/mid cervical left ICA with kinking. Left internal carotid artery angiograms with left anterior oblique views of the neck showed evidence of filling defects at the level of the carotid bulb. Flat panel CT of the head was obtained and post processed in a separate workstation with concurrent attending physician supervision. Selected images were sent to PACS. No evidence of hemorrhagic complication. There is mild contrast staining of the left insular and frontal cortex. Repeat left internal carotid artery angiograms with frontal and lateral views of the head showed Amy seen left M3/MCA branch to the left parietal region. Left common carotid artery angiograms with frontal and lateral views of the neck showed vertebra Gretchen of stenosis at the left ICA bulb with more prominent filling defect. At this point, patient was loaded on cangrelor  followed by continuous drip. Using biplane roadmap guidance, a 4-7 mm Emboshield NAV6 cerebral  protection device was advanced into the cervical left ICA. However, multiple attempts to advance a cerebral protection device through the left ICA kinking proved unsuccessful. The cerebral protection device was subsequently removed. Using biplane roadmap guidance, a 10-8 x 40 mm XACT carotid stent was navigated and deployed from the distal left common carotid artery to the proximal left internal carotid artery, proximal to the vessel kinking. Suboptimal stent expansion was noted. Then, a 6 x 30 mm Viatrac balloon was navigated into the recently deployed stent. Angioplasty was performed under fluoroscopy. Left internal carotid artery angiograms with frontal and lateral views of the neck showed adequate stent positioning and expansion with brisk anterograde flow. Left internal carotid artery angiograms with frontal and lateral views of the head showed  improvement of anterograde flow in the left MCA vascular tree with brisk anterograde flow. Delayed left common carotid artery angiograms with frontal and lateral views of the neck showed no evidence of clot formation within the stent. The catheter was subsequently Riddle. Right common femoral artery angiogram was obtained in right anterior oblique view. The puncture is at the level of the common femoral artery. The artery has normal caliber, adequate for closure device. The sheath was exchanged over the wire for an 8 [place] Angio-Seal which was utilized for access closure. Immediate hemostasis was achieved. IMPRESSION: 1. Successful mechanical thrombectomy for treatment of a proximal left M2/MCA anterior division branch occlusion achieving complete recanalization (TICI 3). 2. 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 PLAN: Continue cangrelor  infusion until patient is transitioned to oral dual antiplatelet therapy. Electronically Signed   By: Katyucia  de Macedo Rodrigues M.D.   On: 06/05/2023 13:16   CT ANGIO HEAD NECK W WO CM W PERF (CODE STROKE) Addendum Date: 06/05/2023 ADDENDUM REPORT: 06/05/2023 12:25 ADDENDUM: Please note, there is a dictation error within CTA neck impression #1, which should read: The common carotid and internal carotid arteries are patent within the neck. Atherosclerotic plaque bilaterally. Most notably, there is progressive atherosclerotic plaque about the left carotid bifurcation and within the proximal left ICA with resultant severe near occlusive stenosis of the proximal left ICA. Also of note, atherosclerotic plaque about the right carotid bifurcation results in a 40% stenosis at the right ICA origin. Electronically Signed   By: Bascom Lily D.O.   On: 06/05/2023 12:25   Result Date: 06/05/2023 CLINICAL DATA:  Provided history:  Cerebrovascular accident, unspecified mechanism. Right-sided weakness. Right-sided facial droop. Altered mental status. EXAM: CT ANGIOGRAPHY HEAD AND NECK CT PERFUSION BRAIN TECHNIQUE: Multidetector CT imaging of the head and neck was performed using the standard protocol during bolus administration of intravenous contrast. Multiplanar CT image reconstructions and MIPs were obtained to evaluate the vascular anatomy. Carotid stenosis measurements (when applicable) are obtained utilizing NASCET criteria, using the distal internal carotid diameter as the denominator. Multiphase CT imaging of the brain was performed following IV bolus contrast injection. Subsequent parametric perfusion maps were calculated using RAPID software. RADIATION DOSE REDUCTION: This exam was performed according to the departmental dose-optimization program which includes automated exposure control, adjustment of the mA and/or kV according to patient size and/or use of iterative reconstruction technique. CONTRAST:  OMNIPAQUE  IOHEXOL  350 MG/ML SOLN COMPARISON:  Noncontrast head CT performed earlier today 06/05/2023. MRA head and MRA neck 10/04/2019. FINDINGS: CTA NECK FINDINGS Aortic arch: Common origin of the innominate and left common carotid arteries. Atherosclerotic plaque within the  visualized thoracic aorta and proximal major branch vessels of the neck. Streak/beam hardening artifact arising from a dense right-sided contrast bolus partially obscures the right subclavian artery. Within this limitation, there is no appreciable hemodynamically significant innominate or proximal subclavian artery stenosis. Right carotid system: CCA and ICA patent within the neck. Atherosclerotic plaque, greatest about the carotid bifurcation. Resultant 40% stenosis at the ICA origin. Left carotid system: CCA and ICA patent within the neck. Atherosclerotic plaque. Most notably, there is prominent atherosclerotic plaque about the carotid bifurcation and  within the proximal ICA which has progressed from the prior MRA neck of 10/04/2019. Resultant severe (near occlusive) stenosis of the proximal ICA. Tortuosity of the cervical ICA Vertebral arteries: The vertebral arteries are patent within the neck. Streak/beam hardening artifact limits evaluation of the right vertebral artery origin. At least moderate stenosis is suspected at this site. Atherosclerotic plaque scattered elsewhere within the cervical right vertebral artery with no more than mild stenosis. Calcified atherosclerotic plaque at the left vertebral artery origin with suspected at least moderate stenosis. Nonstenotic calcified plaque elsewhere within the cervical left vertebral artery. Skeleton: Cervical spondylosis. Other neck: No neck mass or cervical lymphadenopathy. Upper chest: No consolidation within the imaged lung apices. Review of the MIP images confirms the above findings CTA HEAD FINDINGS Anterior circulation: The intracranial internal carotid arteries are patent. As sclerotic plaque within both vessels. No more than mild stenosis on the right. Up to moderate stenosis within the left cavernous segment. The M1 middle cerebral arteries are patent. Abrupt occlusion of a proximal M2 left middle cerebral artery vessel (series 11, image 22). Atherosclerotic irregularity of the M2 and more distal MCA vessels elsewhere. The anterior cerebral arteries are patent. Atherosclerotic irregularity of both vessels without high-grade proximal stenosis. A possible 2 mm periophthalmic left ICA aneurysm with better appreciated on the prior MRA head of 10/04/2019. Posterior circulation: The intracranial vertebral arteries are patent. Atherosclerotic plaque within the right V4 segment sites of mild stenosis. Non-stenotic atherosclerotic plaque within the left V4 segment. The basilar artery is patent. The posterior cerebral arteries are patent. Posterior communicating arteries are diminutive or absent, bilaterally.  Venous sinuses: Assessment for dural venous sinus thrombosis is limited due to contrast timing. Anatomic variants: As described. Review of the MIP images confirms the above findings CT Brain Perfusion Findings: ASPECTS: CBF (<30%) Volume: 41mL Perfusion (Tmax>6.0s) volume: 86mL Mismatch Volume: 45mL Infarction Location:Left MCA vascular territory CTA head impression #1, the CT perfusion head impression and the presence of a severe stenosis of the proximal cervical left ICA called by telephone at the time of interpretation on 06/05/2023 at 8:40 am to provider ERIC [facility] , who verbally acknowledged these results. IMPRESSION: CTA neck: 1. No common carotid and internal carotid arteries are patent within the neck. Atherosclerotic plaque bilaterally. Most notably, there is progressive atherosclerotic plaque about the left carotid bifurcation and within the proximal left ICA with resultant severe, near occlusive stenosis of the proximal left ICA. Also of note, atherosclerotic plaque about the right carotid bifurcation results in 40% stenosis at the right ICA origin. 2. The vertebral arteries are patent within the neck. Atherosclerotic plaque bilaterally as described. Most notably, there is suspected at least moderate stenoses at the bilateral vertebral artery origins. 3. Aortic Atherosclerosis (ICD10-I70.0). CTA head: 1. Abrupt occlusion of a proximal M2 left middle cerebral artery vessel. 2. Background intracranial atherosclerotic disease as described. 3. A possible 2 mm periophthalmic left ICA aneurysm was better appreciated on the prior MRA head of 10/04/2019.  CT perfusion head: The perfusion software identifies a 41 mL core infarct in the left MCA vascular territory. The perfusion software identifies an 86 mL region of critically hypoperfused parenchyma within the left MCA vascular territory (utilizing the Tmax>6 seconds threshold). Reported mismatch volume: 45 mL Electronically Signed: By: Bascom Lily D.O. On:  06/05/2023 09:07   CT HEAD CODE STROKE WO CONTRAST Result Date: 06/05/2023 CLINICAL DATA:  Code stroke. Neuro deficit, acute, stroke suspected. EXAM: CT HEAD WITHOUT CONTRAST TECHNIQUE: Contiguous axial images were obtained from the base of the skull through the vertex without intravenous contrast. RADIATION DOSE REDUCTION: This exam was performed according to the departmental dose-optimization program which includes automated exposure control, adjustment of the mA and/or kV according to patient size and/or use of iterative reconstruction technique. COMPARISON:  Brain MRI 10/04/2019.  Noncontrast head CT 10/03/2019. FINDINGS: Brain: Generalized cerebral atrophy. Loss of gray-white differentiation consistent with an acute infarct within the left insula and within portions of the left frontal operculum (MCA vascular territory). Known small chronic cortically-based infarcts within the left frontal, left parietal and left occipital lobes were better appreciated on the prior brain MRI of 10/04/2019 (acute at that time). Mild patchy and ill-defined hypoattenuation within the cerebral white matter, nonspecific but compatible with chronic small vessel ischemic disease. Subcentimeter infarct within the superior right cerebellar hemisphere, new from the prior MRI but chronic in appearance. Loss of gray-white differentiation there is no acute intracranial hemorrhage. No extra-axial fluid collection. No evidence of an intracranial mass. No midline shift. Vascular: No hyperdense vessel.  Atherosclerotic calcifications. Skull: No calvarial fracture or aggressive osseous lesion. Sinuses/Orbits: No mass or acute finding within the imaged orbits. No significant paranasal sinus disease. ASPECTS [facility] Stroke Program Early CT Score) - Ganglionic level infarction (caudate, lentiform nuclei, internal capsule, insula, M1-M3 cortex): 5 - Supraganglionic infarction (M4-M6 cortex): 2 Total score (0-10 with 10 being normal): 7 Impression  #1 called by telephone at the time of interpretation on 06/05/2023 at 8:40 am to provider Dr. Renaee Caro, who verbally acknowledged these results. IMPRESSION: 1. Acute left MCA territory infarct affecting the left insula and portions of the left frontal operculum. ASPECTS is 7. 2. Known small chronic cortically-based infarcts within the left frontal, left parietal and left occipital lobes were better appreciated on the prior brain MRI of 10/04/2019 (acute at that time). 3. Background mild cerebral white matter chronic small vessel ischemic disease. 4. Subcentimeter infarct within the right cerebellar hemisphere, new from prior MRI but chronic in appearance. 5. Generalized cerebral atrophy. Electronically Signed   By: Bascom Lily D.O.   On: 06/05/2023 08:45     PHYSICAL EXAM  Temp:  [98.6 F (37 C)-99 F (37.2 C)] 98.6 F (37 C) (02/10 1206) Pulse Rate:  [64-80] 64 (02/10 1206) Resp:  [16-18] 18 (02/10 1206) BP: (130-154)/(61-79) 154/61 (02/10 1206) SpO2:  [96 %-100 %] 100 % (02/10 1206)  General - obese, well developed, in no apparent distress. Sitting up in chair  Cardiovascular - Regular rhythm and rate.  Neuro - awake, alert, eyes open, orientated to age, place only.    Severe dysarthria, but following following commands.   No gaze palsy, tracking bilaterally, blinking to visual threat bilaterally.  Right facial droop. Tongue midline Bilateral UEs 4/5, minimal right drift.  Bilaterally LEs 4/5.  Sensation symmetric to light touch. No gross ataxia  gait not tested.    ASSESSMENT/PLAN Nathan Proctor. is a 83 y.o. male with history of hypertension, diabetes, CKD 3,  stroke admitted for right-sided weakness numbness, aphasia, right facial droop, left gaze preference. No TNK given due to outside window.    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 status post IR with TICI3 and confluent HT, likely secondary to large vessel disease source versus  cardiomyopathy with low EF CT left MCA infarct CT head and neck left M2 occlusion, left ICA near occlusion, right ICA 43 stenosis, bilateral VA origin severe stenosis CTP 41/86 Status post IR with TICI3 and left ICA stenting MRI left MCA infarct at left insular and left frontal operculum, prominent and confluent petechial hemorrhagic transformation CT repeat 2/7 stable confluent petechial hemorrhage 2D Echo EF 30% LDL 107 HgbA1c 6.1 UDS negative Heparin  subcu for VTE prophylaxis aspirin  81 mg daily and clopidogrel  75 mg daily prior to admission, now on aspirin  and Plavix  post stenting. Patient will be counseled to be compliant with his antithrombotic medications Ongoing aggressive stroke risk factor management Therapy recommendations:  CIR vs SNF Disposition: Pending  History of stroke 10/08/2019 admitted for left MCA infarct due to right upper extremity weakness.  MRA head and neck showed left ICA 30% stenosis.  EF 45 to 50%.  LDL 105, A1c 5.6.  Recommended loop recorder at that time but only got 30-day CardioNet monitoring which was no A-fib.  Discharged on DAPT and Lipitor  80.  Cardiomyopathy CHF 09/2019 EF 45 to 50% Current admission EF 30% Cardiology on board, appreciate Dr. Maximo Spar assistance On DAPT Agree with GDMT - would like to start losartan , entresto, and spironolactone  as BP and Cr tolerates Metoprolol  succinate 25mg  daily Lasix  80mg  x1 May consider loop recorder placement to rule out A-fib  Diabetes HgbA1c 6.1 goal < 7.0 Controlled CBG monitoring SSI.  Add Semglee  3 units daily and continue to monitor DM education and close PCP follow up  Hypertension Stable BP goal less than 160 given hemorrhagic transformation Long term BP goal normotensive  Hyperlipidemia Home meds: Lipitor  80 LDL 105, goal < 70 Now on lipitor  80 and Zetia  Continue statin and at discharge  Dysphagia Poststroke dysphagia Speech on board Dysphagia 1 (Pureed) honey thick liquid. On core  track-will DC core track as he is eating 100% of meals.  DC tube feeds.  Other Stroke Risk Factors Advanced age Obesity, Body mass index is 41.94 kg/m.   Other Active Problems AKI on CKD3b, creatinine 1.60--1.66 -> 1.98>1.87 Hemoglobin 9.3 >9.7  Hospital day # 5  Eating all his meals.   Continue ongoing therapy.  Transfer to skilled nursing facility for rehab when bed available.  Long discussion with patient, wife and his 2 daughters and answered questions.  Total of 35 mins spent reviewing chart, discussion with patient and family on prognosis, Dx and plan. Discussed case with patient's family reviewed Imaging personally.  Ardella Beaver, MD To contact Stroke Continuity provider, please refer to [email or web address]. After hours, contact General Neurology

## 2023-06-11 DIAGNOSIS — I639 Cerebral infarction, unspecified: Secondary | ICD-10-CM | POA: Diagnosis not present

## 2023-06-11 DIAGNOSIS — I5043 Acute on chronic combined systolic (congestive) and diastolic (congestive) heart failure: Secondary | ICD-10-CM | POA: Diagnosis not present

## 2023-06-11 LAB — BASIC METABOLIC PANEL
Anion gap: 12 (ref 5–15)
BUN: 45 mg/dL — ABNORMAL HIGH (ref 8–23)
CO2: 25 mmol/L (ref 22–32)
Calcium: 9.5 mg/dL (ref 8.9–10.3)
Chloride: 109 mmol/L (ref 98–111)
Creatinine, Ser: 1.81 mg/dL — ABNORMAL HIGH (ref 0.61–1.24)
GFR, Estimated: 37 mL/min — ABNORMAL LOW (ref >60)
Glucose, Bld: 128 mg/dL — ABNORMAL HIGH (ref 70–99)
Potassium: 3.8 mmol/L (ref 3.5–5.1)
Sodium: 146 mmol/L — ABNORMAL HIGH (ref 135–145)

## 2023-06-11 LAB — GLUCOSE, CAPILLARY
Glucose-Capillary: 133 mg/dL — ABNORMAL HIGH (ref 70–99)
Glucose-Capillary: 134 mg/dL — ABNORMAL HIGH (ref 70–99)
Glucose-Capillary: 249 mg/dL — ABNORMAL HIGH (ref 70–99)
Glucose-Capillary: 257 mg/dL — ABNORMAL HIGH (ref 70–99)
Glucose-Capillary: 225 mg/dL — ABNORMAL HIGH (ref 70–99)

## 2023-06-11 LAB — CBC
HCT: 31.6 % — ABNORMAL LOW (ref 39.0–52.0)
Hemoglobin: 10 g/dL — ABNORMAL LOW (ref 13.0–17.0)
MCH: 30.6 pg (ref 26.0–34.0)
MCHC: 31.6 g/dL (ref 30.0–36.0)
MCV: 96.6 fL (ref 80.0–100.0)
Platelets: 187 10*3/uL (ref 150–400)
RBC: 3.27 MIL/uL — ABNORMAL LOW (ref 4.22–5.81)
RDW: 13.5 % (ref 11.5–15.5)
WBC: 8.1 10*3/uL (ref 4.0–10.5)
nRBC: 0 % (ref 0.0–0.2)

## 2023-06-11 MED ORDER — METOPROLOL SUCCINATE ER 25 MG PO TB24
25.0000 mg | ORAL_TABLET | Freq: Every day | ORAL | Status: DC
Start: 2023-06-12 — End: 2023-06-15
  Administered 2023-06-12 – 2023-06-14 (×3): 25 mg via ORAL
  Filled 2023-06-11 (×3): qty 1

## 2023-06-11 MED ORDER — EZETIMIBE 10 MG PO TABS
10.0000 mg | ORAL_TABLET | Freq: Every day | ORAL | Status: DC
  Administered 2023-06-12 – 2023-06-14 (×3): 10 mg via ORAL
  Filled 2023-06-11 (×3): qty 1

## 2023-06-11 MED ORDER — ATORVASTATIN CALCIUM 80 MG PO TABS
80.0000 mg | ORAL_TABLET | Freq: Every day | ORAL | Status: DC
  Administered 2023-06-12 – 2023-06-14 (×3): 80 mg via ORAL
  Filled 2023-06-11 (×3): qty 1

## 2023-06-11 MED ORDER — ASPIRIN 81 MG PO CHEW
81.0000 mg | CHEWABLE_TABLET | Freq: Every day | ORAL | Status: DC
  Administered 2023-06-12 – 2023-06-14 (×3): 81 mg via ORAL
  Filled 2023-06-11 (×3): qty 1

## 2023-06-11 MED ORDER — CLOPIDOGREL BISULFATE 75 MG PO TABS
75.0000 mg | ORAL_TABLET | Freq: Every day | ORAL | Status: DC
  Administered 2023-06-12 – 2023-06-14 (×3): 75 mg via ORAL
  Filled 2023-06-11 (×3): qty 1

## 2023-06-11 MED ORDER — METOPROLOL TARTRATE 12.5 MG HALF TABLET
12.5000 mg | ORAL_TABLET | Freq: Two times a day (BID) | ORAL | Status: AC
  Administered 2023-06-11: 12.5 mg via ORAL
  Filled 2023-06-11: qty 1

## 2023-06-11 NOTE — Discharge Summary (Shared)
Stroke Discharge Summary  Patient ID: Nathan Proctor   MRN: [id number]      DOB: December 13, 1940  Date of Admission: 06/05/2023 Date of Discharge: 06/11/2023  Attending Physician:  Stroke, Md, MD Consultant(s):    cardiology and pulmonary/intensive care  Patient's PCP:  Fleet Contras, MD  DISCHARGE PRIMARY DIAGNOSIS:  Stroke: left MCA infarct left M2 occlusion and left ICA near occlusion status post IR with TICI3 and confluent HT, likely secondary to large vessel disease source versus cardiomyopathy with low EF   Patient Active Problem List   Diagnosis Date Noted   Acute on chronic combined systolic and diastolic CHF (congestive heart failure) (HCC) 06/10/2023   Acute systolic heart failure (HCC) 06/07/2023   AKI (acute kidney injury) (HCC) 06/07/2023   Malnutrition of moderate degree 06/06/2023   Pain due to onychomycosis of toenails of both feet 09/05/2022   Acute on chronic renal failure (HCC) 10/14/2019   Acute ischemic left MCA stroke (HCC) 10/07/2019   Morbid obesity (HCC)    NSVT (nonsustained ventricular tachycardia) (HCC)    Diabetic peripheral neuropathy (HCC)    Acute ischemic stroke (HCC) 10/04/2019   Hypoglycemia due to insulin 10/04/2019   CKD (chronic kidney disease), stage III (HCC) 10/04/2019   Dehydration 10/04/2019   Dyslipidemia 10/04/2019   Stroke (HCC) 10/04/2019   Ankle fracture, left 02/29/2016   Closed left ankle fracture 02/29/2016   Gout 02/20/2016   Primary hypertension 02/20/2016     Secondary Diagnoses: Hypertension Hyperlipidemia Diabetes Type II CHF Cardiomyopathy   Allergies as of 06/11/2023   No Known Allergies   Med Rec must be completed prior to using this SMARTLINK***       LABORATORY STUDIES CBC    Component Value Date/Time   WBC 8.1 06/11/2023 0544   RBC 3.27 (L) 06/11/2023 0544   HGB 10.0 (L) 06/11/2023 0544   HCT 31.6 (L) 06/11/2023 0544   PLT 187 06/11/2023 0544   MCV 96.6 06/11/2023 0544   MCH 30.6  06/11/2023 0544   MCHC 31.6 06/11/2023 0544   RDW 13.5 06/11/2023 0544   LYMPHSABS 3.2 06/05/2023 0815   MONOABS 0.4 06/05/2023 0815   EOSABS 0.1 06/05/2023 0815   BASOSABS 0.0 06/05/2023 0815   CMP    Component Value Date/Time   NA 146 (H) 06/11/2023 0544   NA 146 03/06/2016 0000   K 3.8 06/11/2023 0544   CL 109 06/11/2023 0544   CO2 25 06/11/2023 0544   GLUCOSE 128 (H) 06/11/2023 0544   BUN 45 (H) 06/11/2023 0544   BUN 20 03/06/2016 0000   CREATININE 1.81 (H) 06/11/2023 0544   CREATININE 1.56 (H) 10/04/2022 1130   CALCIUM 9.5 06/11/2023 0544   PROT 6.1 (L) 06/06/2023 0557   ALBUMIN 3.3 (L) 06/06/2023 0557   AST 26 06/06/2023 0557   ALT 12 06/06/2023 0557   ALKPHOS 72 06/06/2023 0557   BILITOT 1.1 06/06/2023 0557   GFRNONAA 37 (L) 06/11/2023 0544   GFRAA 52 (L) 12/08/2019 1153   COAGS Lab Results  Component Value Date   INR 1.3 (H) 06/06/2023   INR 1.0 06/05/2023   INR 1.1 10/04/2019   Lipid Panel    Component Value Date/Time   CHOL 183 06/06/2023 0557   TRIG 81 06/06/2023 0557   HDL 60 06/06/2023 0557   CHOLHDL 3.1 06/06/2023 0557   VLDL 16 06/06/2023 0557   LDLCALC 107 (H) 06/06/2023 0557   LDLCALC 223 (H) 05/31/2021 0000   HgbA1C  Lab Results  Component Value Date   HGBA1C 6.1 (H) 06/05/2023   Urine Drug Screen negative Alcohol Level    Component Value Date/Time   ETH <10 06/05/2023 0815     SIGNIFICANT DIAGNOSTIC STUDIES DG Abd 1 View Result Date: 06/09/2023 CLINICAL DATA:  [id number]. Encounter for feeding tube placement. EXAM: ABDOMEN - 1 VIEW COMPARISON:  Abdomen film 06/05/2023. FINDINGS: Dobbhoff feeding tube is well placed with the radiopaque tip in the distal stomach. The visualized bowel pattern is nonobstructive. There is barium newly seen in the flexures and transverse colon. There is no supine evidence of free air. There is atelectasis in the lung bases. Cardiomegaly. IMPRESSION: 1. Dobbhoff feeding tube well placed with the radiopaque tip in  the distal stomach. 2. Nonobstructive bowel gas pattern. 3. Cardiomegaly. Electronically Signed   By: Almira Bar M.D.   On: 06/09/2023 03:41   DG CHEST PORT 1 VIEW Result Date: 06/08/2023 CLINICAL DATA:  CHF EXAM: PORTABLE CHEST 1 VIEW COMPARISON:  01/06/2023 FINDINGS: Feeding tube in place. There is cardiomegaly with vascular congestion. Chronic elevation of the right hemidiaphragm with right base atelectasis. Interstitial prominence throughout the lungs, right greater than left could reflect interstitial edema. Possible small effusions. No acute bony abnormality. IMPRESSION: Cardiomegaly with vascular congestion and probable mild interstitial edema. Question small bilateral effusions. Chronic elevation of the right hemidiaphragm with right base atelectasis. Electronically Signed   By: Charlett Nose M.D.   On: 06/08/2023 18:20   DG Swallowing Func-Speech Pathology Result Date: 06/07/2023 Table formatting from the original result was not included. Modified Barium Swallow Study Study completed and documented by [name], SLP Student Supervised and reviewed by Harlon Ditty, MA CCC-SLP [facility] Secure Chat Preferred Office [phone] Patient Details Name: Nathan Proctor. MRN: [id number] Date of Birth: 10/02/1940 Today's Date: 06/07/2023 HPI/PMH: HPI: Pt is a 83 y.o. male who was admitted as a code stroke on 02/05 due to acute onset of right-sided weakness, right facial droop and aphasia. MRI from 02/06 displayed an evolving acute left MCA infarct, most pronounced at the left insula and left frontal operculum. Pt has intentionally lost 300 pounds in the recent past, with pre-loss weight at ~600Ib. PMHx includes hypertension, diabetes, CKD, prior CVA. Clinical Impression: Clinical Impression: Pt presented with a moderate-severe oropharyngeal dysphagia (DIGEST Score: 3) primarily characterized by silent aspiration of thin liquid, silent penetration of nectar-thick liquid, diffuse  pharyngeal residue collection during thin/nectar-thick consistencies, and posterior-escape of bolus across multiple consistencies. Pt positioning was a potential limitation for examination due to consistent posterior head position. More anterior head positioning during nectar-thick liquid displayed less pharyngeal residue, increased hyo-laryngeal excursion, and overall better airway safety. This positioning was not present throughout due to pt's receptive language deficits to understand cues. Alternating between honey-thick and puree consistencies provided more oropharyngeal bolus clearance and no signs of aspiration. Recommend a dysphagia 1 (puree) and honey-thick liquid diet to optimize pt's airway safety and potential for meeting nutritional needs. Slow rate, small bites/sips, natural head posturing, and multiple swallows are compensatory strategies that displayed best efficiency with pt. F/u with SLP for family education, compensatory strategy training, and upgraded diet trials is recommended. Factors that may increase risk of adverse event in presence of aspiration [name] & Clearance [name] 2021): Factors that may increase risk of adverse event in presence of aspiration [name] & Clearance [name] 2021): Weak cough Recommendations/Plan: Swallowing Evaluation Recommendations Swallowing Evaluation Recommendations Recommendations: PO diet PO Diet Recommendation: Dysphagia 1 (Pureed); Moderately thick liquids (Level 3,  honey thick) Liquid Administration via: Spoon Medication Administration: Crushed with puree Supervision: Full supervision/cueing for swallowing strategies; Full assist for feeding Swallowing strategies  : Slow rate; Small bites/sips; Multiple dry swallows after each bite/sip (Pt needs to be in a neutral head position (no posterior head tilt).) Postural changes: Position pt fully upright for meals; Stay upright 30-60 min after meals Oral care recommendations: Oral care BID (2x/day); Staff/trained caregiver to provide  oral care Caregiver Recommendations: Have oral suction available Treatment Plan Treatment Plan Treatment recommendations: Therapy as outlined in treatment plan below Follow-up recommendations: Follow physicians's recommendations for discharge plan and follow up therapies Functional status assessment: Patient has had a recent decline in their functional status and demonstrates the ability to make significant improvements in function in a reasonable and predictable amount of time. Treatment frequency: Min 2x/week Treatment duration: 2 weeks Interventions: Aspiration precaution training; Compensatory techniques; Patient/family education; Diet toleration management by SLP; Trials of upgraded texture/liquids Recommendations Recommendations for follow up therapy are one component of a multi-disciplinary discharge planning process, led by the attending physician.  Recommendations may be updated based on patient status, additional functional criteria and insurance authorization. Assessment: Orofacial Exam: Orofacial Exam Oral Cavity - Dentition: Adequate natural dentition Orofacial Anatomy: WFL Anatomy: Anatomy: WFL Boluses Administered: Boluses Administered Boluses Administered: Thin liquids (Level 0); Mildly thick liquids (Level 2, nectar thick); Moderately thick liquids (Level 3, honey thick); Puree; Solid  Oral Impairment Domain: Oral Impairment Domain Lip Closure: Escape progressing to mid-chin Tongue control during bolus hold: Posterior escape of greater than half of bolus Bolus preparation/mastication: -- (Pt did not chew solid and SLP had to remove it from mouth.) Bolus transport/lingual motion: Repetitive/disorganized tongue motion Oral residue: Residue collection on oral structures Location of oral residue : Tongue; Palate Initiation of pharyngeal swallow : Pyriform sinuses  Pharyngeal Impairment Domain: Pharyngeal Impairment Domain Soft palate elevation: No bolus between soft palate (SP)/pharyngeal wall (PW)  Laryngeal elevation: Partial superior movement of thyroid cartilage/partial approximation of arytenoids to epiglottic petiole Anterior hyoid excursion: Partial anterior movement Epiglottic movement: Partial inversion Laryngeal vestibule closure: Incomplete, narrow column air/contrast in laryngeal vestibule Pharyngeal stripping wave : Present - complete Pharyngeal contraction (A/P view only): N/A Pharyngoesophageal segment opening: Complete distension and complete duration, no obstruction of flow Tongue base retraction: Narrow column of contrast or air between tongue base and PPW Pharyngeal residue: Collection of residue within or on pharyngeal structures Location of pharyngeal residue: Diffuse (>3 areas)  Esophageal Impairment Domain: Esophageal Impairment Domain Esophageal clearance upright position: -- (Not tested.) Pill: No data recorded Penetration/Aspiration Scale Score: Penetration/Aspiration Scale Score 1.  Material does not enter airway: Puree; Moderately thick liquids (Level 3, honey thick) 3.  Material enters airway, remains ABOVE vocal cords and not ejected out: Mildly thick liquids (Level 2, nectar thick) 8.  Material enters airway, passes BELOW cords without attempt by patient to eject out (silent aspiration) : Thin liquids (Level 0) Compensatory Strategies: Compensatory Strategies Compensatory strategies: Yes Straw: Ineffective Multiple swallows: Effective (Required max cueing due to pt's language.) Effective Multiple Swallows: Puree; Moderately thick liquid (Level 3, honey thick); Mildly thick liquid (Level 2, nectar thick)   General Information: Caregiver present: No  Diet Prior to this Study: NPO   Temperature : Normal   Respiratory Status: WFL   Supplemental O2: None (Room air)   No data recorded Behavior/Cognition: Alert; Cooperative; Pleasant mood; Requires cueing Self-Feeding Abilities: Needs assist with self-feeding Baseline vocal quality/speech: Dysphonic Volitional Cough: Unable to elicit  Volitional Swallow: Able  to elicit Exam Limitations: Poor positioning Goal Planning: Prognosis for improved oropharyngeal function: Good Barriers to Reach Goals: Language deficits No data recorded No data recorded Consulted and agree with results and recommendations: Pt unable/family or caregiver not available Pain: Pain Assessment Pain Assessment: No/denies pain Faces Pain Scale: 0 Facial Expression: 0 Body Movements: 0 Muscle Tension: 0 Compliance with ventilator (intubated pts.): N/A Vocalization (extubated pts.): 0 CPOT Total: 0 End of Session: Start Time:SLP Start Time (ACUTE ONLY): 0903 Stop Time: SLP Stop Time (ACUTE ONLY): 0930 Time Calculation:SLP Time Calculation (min) (ACUTE ONLY): 27 min Charges: SLP Evaluations $ SLP Speech Visit: 1 Visit SLP Evaluations $BSS Swallow: 1 Procedure $MBS Swallow: 1 Procedure $ SLP EVAL LANGUAGE/SOUND PRODUCTION: 1 Procedure $Swallowing Treatment: 1 Procedure SLP visit diagnosis: SLP Visit Diagnosis: Dysphagia, oropharyngeal phase (R13.12) Past Medical History: Past Medical History: Diagnosis Date  Anemia of chronic disease   Ankle fracture 02/15/2016  Cancer (HCC)   Prostate  Chronic constipation   Chronic kidney disease   stage III  Diabetes mellitus without complication (HCC)   type II   Diabetic peripheral neuropathy (HCC)   Failure to thrive (0-17)   Fracture of left lower leg   Gout   Hyperlipidemia   Hypertension   Morbid obesity (HCC)   Unstable gait  Past Surgical History: Past Surgical History: Procedure Laterality Date  IR CT HEAD LTD  06/05/2023  IR INTRAVSC STENT CERV CAROTID W/O EMB-PROT MOD SED INC ANGIO  06/05/2023  IR PERCUTANEOUS ART THROMBECTOMY/INFUSION INTRACRANIAL INC DIAG ANGIO  06/05/2023  IR US GUIDE VASC ACCESS RIGHT  06/05/2023  ORIF ANKLE FRACTURE Left 02/29/2016  Procedure: OPEN REDUCTION INTERNAL FIXATION (ORIF) ANKLE FRACTURE;  Surgeon: Yolonda Kida, MD;  Location: [facility];  Service: Orthopedics;  Laterality: Left;  PROSTATE SURGERY    RADIOLOGY  WITH ANESTHESIA N/A 06/05/2023  Procedure: IR WITH ANESTHESIA;  Surgeon: Radiologist, Medication, MD;  Location: [facility];  Service: Radiology;  Laterality: N/A; [place], Riley Nearing 06/07/2023, 12:15 PM  CT HEAD WO CONTRAST ( ) Result Date: 06/07/2023 CLINICAL DATA:  83 year old male status post code stroke presentation, left MCA M2 occlusion, with left MCA infarct with confluent petechial hemorrhage. EXAM: CT HEAD WITHOUT CONTRAST TECHNIQUE: Contiguous axial images were obtained from the base of the skull through the vertex without intravenous contrast. RADIATION DOSE REDUCTION: This exam was performed according to the departmental dose-optimization program which includes automated exposure control, adjustment of the mA and/or kV according to patient size and/or use of iterative reconstruction technique. COMPARISON:  Brain MRI yesterday.  Head CT 06/05/2023. FINDINGS: Brain: Confluent mixed density infarct in the left MCA middle division, epicenter at the insula and operculum. Confluent petechial hemorrhage on series 3, image 16 appears stable from the MRI. Superimposed trace subarachnoid blood is difficult to exclude by CT (series 3, image 13), but was not apparent on MRI. Stable mild mass effect on the left lateral ventricle with only trace rightward midline shift (series 3, image 16). No ventriculomegaly. Elsewhere gray-white differentiation is stable from the presentation CT. Basilar cisterns remain patent. Vascular: Resolved hyperdense left MCA since presentation. Calcified atherosclerosis at the skull base. Skull: Stable, intact. Sinuses/Orbits: Visualized paranasal sinuses and mastoids are stable and well aerated. Other: Left nasoenteric tube now in place. Stable orbit and scalp soft tissues. IMPRESSION: 1. Stable by CT confluent Left MCA middle division infarct with petechial hemorrhage (Heidelberg classification 1b: HI2, confluent petechiae, no mass effect). Questionable trace superimposed SAH, but was  not apparent on MRI. 2. Stable  minimal intracranial mass effect. 3. No new intracranial abnormality. Electronically Signed   By: Odessa Fleming M.D.   On: 06/07/2023 05:37   MR BRAIN WO CONTRAST Result Date: 06/06/2023 CLINICAL DATA:  Follow-up examination for stroke. EXAM: MRI HEAD WITHOUT CONTRAST TECHNIQUE: Multiplanar, multiecho pulse sequences of the brain and surrounding structures were obtained without intravenous contrast. COMPARISON:  Comparison made to multiple previous exams from 06/05/2023. FINDINGS: Brain: Examination degraded by motion artifact. Cerebral volume within normal limits for age. Patchy T2/FLAIR hyperintensity involving the periventricular deep white matter both cerebral hemispheres, consistent with chronic small vessel ischemic disease, mild in nature. Small remote infarct noted within the right cerebellum. Confluent restricted diffusion involving the left insula and left frontal operculum, consistent with evolving acute left MCA distribution infarct. Area of infarction measures up to approximately 6 cm in AP diameter. Prominent associated susceptibility artifact, consistent with petechial hemorrhage (series 7, image 61) ( Heidelberg classification 1b: HI2, confluent petechiae, no mass effect. No frank or organized hematoma evident by MRI. No significant regional mass effect. Few additional small foci of infarction noted within the left parieto-occipital region posteriorly (series 3, images 40, 29). Apparent diffusion signal along the right frontal parafalcine region felt to be consistent with artifact related to dural calcification. Note made of a few additional chronic micro hemorrhages within the left cerebellum and right thalamus. No mass lesion or midline shift. Ventricles normal size without hydrocephalus. No extra-axial fluid collection. Pituitary gland suprasellar region within normal limits. Vascular: Major intracranial vascular flow voids are maintained. Skull and upper cervical spine:  Craniocervical junction within normal limits. Bone marrow signal intensity overall within normal limits. No scalp soft tissue abnormality. Sinuses/Orbits: Prior bilateral ocular lens replacement. Paranasal sinuses are clear. No mastoid effusion. Other: None. IMPRESSION: 1. Evolving acute left MCA distribution infarct, most pronounced at the left insula and left frontal operculum. Prominent associated petechial hemorrhage without frank intraparenchymal hematoma (Heidelberg classification 1b: HI2, confluent petechiae, no mass effect). 2. Few additional small foci of acute infarction within the left parieto-occipital region posteriorly. 3. Underlying mild chronic microvascular ischemic disease with small remote right cerebellar infarct. Electronically Signed   By: Rise Mu M.D.   On: 06/06/2023 03:12   ECHOCARDIOGRAM COMPLETE Result Date: 06/05/2023    ECHOCARDIOGRAM REPORT   Patient Name:   Nathan Proctor. Date of Exam: 06/05/2023 Medical Rec #:  [id number]              Height:       70.0 in Accession #:    [id number]             Weight:       292.3 lb Date of Birth:  [birth year]              BSA:          2.453 m Patient Age:    82 years               BP:           129/72 mmHg Patient Gender: M                      HR:           89 bpm. Exam Location:  Inpatient Procedure: 2D Echo, Color Doppler, Cardiac Doppler and Intracardiac            Opacification Agent Indications:    Stroke I63.9  History:  Patient has prior history of Echocardiogram examinations, most                 recent 10/06/2019. Risk Factors:Diabetes, Hypertension and                 Dyslipidemia.  Sonographer:    Harriette Bouillon RDCS Referring Phys: Lynnae January IMPRESSIONS  1. Left ventricular ejection fraction, by estimation, is 30%. The left ventricle has moderately decreased function. The left ventricle demonstrates global hypokinesis. There is mild left ventricular hypertrophy.  2. Right ventricular systolic function is  normal. The right ventricular size is normal.  3. Trivial mitral valve regurgitation.  4. The aortic valve is tricuspid. Aortic valve regurgitation is not visualized.  5. The inferior vena cava is normal in size with greater than 50% respiratory variability, suggesting right atrial pressure of 3 mmHg. Comparison(s): The left ventricular function is worsened. FINDINGS  Left Ventricle: Left ventricular ejection fraction, by estimation, is 30%. The left ventricle has moderately decreased function. The left ventricle demonstrates global hypokinesis. Definity contrast agent was given IV to delineate the left ventricular endocardial borders. The left ventricular internal cavity size was normal in size. There is mild left ventricular hypertrophy. Right Ventricle: The right ventricular size is normal. Right vetricular wall thickness was not assessed. Right ventricular systolic function is normal. Left Atrium: Left atrial size was normal in size. Right Atrium: Right atrial size was normal in size. Pericardium: There is no evidence of pericardial effusion. Mitral Valve: There is mild thickening of the mitral valve leaflet(s). Mild to moderate mitral annular calcification. Trivial mitral valve regurgitation. Tricuspid Valve: The tricuspid valve is normal in structure. Tricuspid valve regurgitation is trivial. Aortic Valve: The aortic valve is tricuspid. Aortic valve regurgitation is not visualized. Pulmonic Valve: The pulmonic valve was normal in structure. Pulmonic valve regurgitation is not visualized. Aorta: The aortic root and ascending aorta are structurally normal, with no evidence of dilitation. Venous: The inferior vena cava is normal in size with greater than 50% respiratory variability, suggesting right atrial pressure of 3 mmHg. IAS/Shunts: The interatrial septum was not assessed.  LEFT VENTRICLE PLAX 2D LVIDd:         5.00 cm   Diastology LVIDs:         4.40 cm   LV e' lateral: 5.33 cm/s LV PW:         1.20 cm LV  IVS:        1.10 cm LVOT diam:     2.30 cm LV SV:         55 LV SV Index:   23 LVOT Area:     4.15 cm  RIGHT VENTRICLE RV S prime:     14.40 cm/s LEFT ATRIUM         Index LA diam:    4.60 cm 1.88 cm/m  AORTIC VALVE LVOT Vmax:   65.50 cm/s LVOT Vmean:  45.200 cm/s LVOT VTI:    0.133 m  AORTA Ao Root diam: 3.00 cm Ao Asc diam:  3.50 cm  SHUNTS Systemic VTI:  0.13 m Systemic Diam: 2.30 cm Dietrich Pates MD Electronically signed by Dietrich Pates MD Signature Date/Time: 06/05/2023/9:33:38 PM    Final    DG Abd Portable 1V Result Date: 06/05/2023 CLINICAL DATA:  Feeding tube placement. EXAM: PORTABLE ABDOMEN - 1 VIEW COMPARISON:  None Available. FINDINGS: Distal tip of feeding tube is seen in expected position of distal stomach. IMPRESSION: Distal tip of feeding tube seen in expected position  of distal stomach. Electronically Signed   By: Lupita Raider M.D.   On: 06/05/2023 15:49   CT HEAD WO CONTRAST Result Date: 06/05/2023 CLINICAL DATA:  Stroke, follow-up. Status post intracranial mechanical thrombectomy and stenting of the left ICA bifurcation. EXAM: CT HEAD WITHOUT CONTRAST TECHNIQUE: Contiguous axial images were obtained from the base of the skull through the vertex without intravenous contrast. RADIATION DOSE REDUCTION: This exam was performed according to the departmental dose-optimization program which includes automated exposure control, adjustment of the mA and/or kV according to patient size and/or use of iterative reconstruction technique. COMPARISON:  CT head without contrast and CT angio head and neck 06/05/2023. By plain CT 06/05/2023. FINDINGS: Brain: The study is mildly degraded by patient motion. The left insular and left opercular infarct is somewhat obscured by patient motion. The cortex is slightly hyperdense, potentially reflecting reperfusion. No hemorrhage is present. Basal ganglia are intact. Subcortical white matter hypoattenuation in the high left frontal lobe is new. Mild right-sided white  matter disease is stable. Basal ganglia are intact. A remote lacunar infarct is again noted in the right cerebellum. The brainstem and cerebellum are otherwise within normal limits. Vascular: Atherosclerotic calcifications are present within the cavernous internal carotid arteries and at the normal origin of both vertebral arteries. No hyperdense vessel is present. Skull: Calvarium is intact. No focal lytic or blastic lesions are present. No significant extracranial soft tissue lesion is present. Sinuses/Orbits: The paranasal sinuses and mastoid air cells are clear. Bilateral lens replacements are noted. Globes and orbits are otherwise unremarkable. IMPRESSION: 1. The left insular and left opercular infarct is somewhat obscured by patient motion. 2. The cortex is slightly hyperdense, potentially reflecting reperfusion. 3. No hemorrhage. 4. Subcortical white matter hypoattenuation in the high left frontal lobe is new. This may reflect ischemic changes or edema related to the reperfusion. 5. Remote lacunar infarct of the right cerebellum. 6. Stable mild right-sided white matter disease. This likely reflects the sequela of chronic microvascular ischemia. Electronically Signed   By: Marin Roberts M.D.   On: 06/05/2023 14:38   IR PERCUTANEOUS ART THROMBECTOMY/INFUSION INTRACRANIAL INC DIAG ANGIO Result Date: 06/05/2023 INDICATION: 83 year old male presenting with right-sided weakness and aphasia; NIHSS 28. His last known well was 10 p.m. on 06/04/2023. His past medical history significant for prior stroke, hypertension, diabetes and chronic kidney disease; baseline modified Rankin scale 0. Head CT showed hypodensity within the left insula, basal ganglia and left frontal operculum (ASPECTS 7). No IV thrombolytic given as patient was outside the window. CT angiogram of the head and neck showed an occlusion of a left M2/MCA anterior division branch. CT perfusion showed a 41 mL core infarct with a 45 mL ischemic  penumbra. She was transferred to our service for mechanical thrombectomy. EXAM: ULTRASOUND-GUIDED VASCULAR ACCESS DIAGNOSTIC CEREBRAL ANGIOGRAM MECHANICAL THROMBECTOMY FLAT PANEL HEAD CT LEFT CAROTID STENTING AND ANGIOPLASTY WITHOUT CEREBRAL PROTECTION DEVICE COMPARISON:  CT/CT angiogram of the head and neck June 05, 2023. MEDICATIONS: No antibiotics administered. ANESTHESIA/SEDATION: The procedure was performed under general anesthesia. CONTRAST:  80 mL of Omnipaque 300 milligram/mL FLUOROSCOPY: Radiation Exposure Index (as provided by the fluoroscopic device): 1315 mGy Kerma COMPLICATIONS: None immediate. TECHNIQUE: Informed written consent was obtained from the patient's wife after a thorough discussion of the procedural risks, benefits and alternatives. All questions were addressed. Maximal Sterile Barrier Technique was utilized including caps, mask, sterile gowns, sterile gloves, sterile drape, hand hygiene and skin antiseptic. A timeout was performed prior to the initiation of the  procedure. The right groin was prepped and draped in the usual sterile fashion. Using a micropuncture kit and the modified Seldinger technique, access was gained to the right common femoral artery and an 8 French sheath was placed. Real-time ultrasound guidance was utilized for vascular access including the acquisition of a permanent ultrasound image documenting patency of the accessed vessel. Under fluoroscopy, an 8 [place] Walrus balloon guide catheter was navigated over a 6 [place] VTK catheter and a 0.035" Terumo Glidewire into the aortic arch. The catheter was placed into the left common carotid artery and then advanced into the left internal carotid artery. The diagnostic catheter was removed. Frontal and lateral angiograms of the head were obtained. FINDINGS: 1. Ultrasound showed heavily calcified right common femoral artery is maintained patency and caliber. 2. Proximal occlusion of a left M2/MCA anterior division branch. 3.  Atherosclerotic changes of the intracranial left ICA with mild stenosis at the distal cavernous segment. 4. 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 PROCEDURE: Using biplane roadmap guidance, a Red 62 aspiration catheter was navigated over Colossus 35 microguidewire into the cavernous segment of the left ICA. The aspiration catheter was then advanced to the level of occlusion and connected to an aspiration pump. Continuous aspiration was performed for 2 minutes. The guide catheter was connected to a VacLok syringe and the guiding catheter balloon was inflated. The aspiration catheter was subsequently removed under constant aspiration. The guide catheter was aspirated for debris. Left internal carotid artery angiograms with frontal and lateral views of the head showed complete recanalization of the left MCA vascular tree. The guide catheter was retracted into the neck. Frontal and lateral angiograms of the neck were obtained. Improvement of the degree of stenosis in the carotid bulb compared to prior CT angiogram. There is prominent luminal irregularity at the carotid bulb residual moderate stenosis. Increased tortuosity of the proximal/mid cervical left ICA with kinking. Left internal carotid artery angiograms with left anterior oblique views of the neck showed evidence of filling defects at the level of the carotid bulb. Flat panel CT of the head was obtained and post processed in a separate workstation with concurrent attending physician supervision. Selected images were sent to PACS. No evidence of hemorrhagic complication. There is mild contrast staining of the left insular and frontal cortex. Repeat left internal carotid artery angiograms with frontal and lateral views of the head showed Amy seen left M3/MCA branch to the left parietal region. Left common carotid artery angiograms with frontal and lateral  views of the neck showed vertebra Gretchen of stenosis at the left ICA bulb with more prominent filling defect. At this point, patient was loaded on cangrelor followed by continuous drip. Using biplane roadmap guidance, a 4-7 mm Emboshield NAV6 cerebral protection device was advanced into the cervical left ICA. However, multiple attempts to advance a cerebral protection device through the left ICA kinking proved unsuccessful. The cerebral protection device was subsequently removed. Using biplane roadmap guidance, a 10-8 x 40 mm XACT carotid stent was navigated and deployed from the distal left common carotid artery to the proximal left internal carotid artery, proximal to the vessel kinking. Suboptimal stent expansion was noted. Then, a 6 x 30 mm Viatrac balloon was navigated into the recently deployed stent. Angioplasty was performed under fluoroscopy. Left internal carotid artery angiograms with frontal and lateral views of the neck showed adequate stent positioning and expansion with brisk anterograde flow.  Left internal carotid artery angiograms with frontal and lateral views of the head showed improvement of anterograde flow in the left MCA vascular tree with brisk anterograde flow. Delayed left common carotid artery angiograms with frontal and lateral views of the neck showed no evidence of clot formation within the stent. The catheter was subsequently Riddle. Right common femoral artery angiogram was obtained in right anterior oblique view. The puncture is at the level of the common femoral artery. The artery has normal caliber, adequate for closure device. The sheath was exchanged over the wire for an 8 [place] Angio-Seal which was utilized for access closure. Immediate hemostasis was achieved. IMPRESSION: 1. Successful mechanical thrombectomy for treatment of a proximal left M2/MCA anterior division branch occlusion achieving complete recanalization (TICI 3). 2. 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 PLAN: Continue cangrelor infusion until patient is transitioned to oral dual antiplatelet therapy. Electronically Signed   By: Baldemar Lenis M.D.   On: 06/05/2023 13:16   IR US Guide Vasc Access Right Result Date: 06/05/2023 INDICATION: 83 year old male presenting with right-sided weakness and aphasia; NIHSS 28. His last known well was 10 p.m. on 06/04/2023. His past medical history significant for prior stroke, hypertension, diabetes and chronic kidney disease; baseline modified Rankin scale 0. Head CT showed hypodensity within the left insula, basal ganglia and left frontal operculum (ASPECTS 7). No IV thrombolytic given as patient was outside the window. CT angiogram of the head and neck showed an occlusion of a left M2/MCA anterior division branch. CT perfusion showed a 41 mL core infarct with a 45 mL ischemic penumbra. She was transferred to our service for mechanical thrombectomy. EXAM: ULTRASOUND-GUIDED VASCULAR ACCESS DIAGNOSTIC CEREBRAL ANGIOGRAM MECHANICAL THROMBECTOMY FLAT PANEL HEAD CT LEFT CAROTID STENTING AND ANGIOPLASTY WITHOUT CEREBRAL PROTECTION DEVICE COMPARISON:  CT/CT angiogram of the head and neck June 05, 2023. MEDICATIONS: No antibiotics administered. ANESTHESIA/SEDATION: The procedure was performed under general anesthesia. CONTRAST:  80 mL of Omnipaque 300 milligram/mL FLUOROSCOPY: Radiation Exposure Index (as provided by the fluoroscopic device): 1315 mGy Kerma COMPLICATIONS: None immediate. TECHNIQUE: Informed written consent was obtained from the patient's wife after a thorough discussion of the procedural risks, benefits and alternatives. All questions were addressed. Maximal Sterile Barrier Technique was utilized including caps, mask, sterile gowns, sterile gloves, sterile drape, hand hygiene and skin antiseptic. A timeout  was performed prior to the initiation of the procedure. The right groin was prepped and draped in the usual sterile fashion. Using a micropuncture kit and the modified Seldinger technique, access was gained to the right common femoral artery and an 8 French sheath was placed. Real-time ultrasound guidance was utilized for vascular access including the acquisition of a permanent ultrasound image documenting patency of the accessed vessel. Under fluoroscopy, an 8 [place] Walrus balloon guide catheter was navigated over a 6 [place] VTK catheter and a 0.035" Terumo Glidewire into the aortic arch. The catheter was placed into the left common carotid artery and then advanced into the left internal carotid artery. The diagnostic catheter was removed. Frontal and lateral angiograms of the head were obtained. FINDINGS: 1. Ultrasound showed heavily calcified right common femoral artery is maintained patency and caliber. 2. Proximal occlusion of a left M2/MCA anterior division branch. 3. Atherosclerotic changes of the intracranial left ICA with mild stenosis at the distal cavernous segment. 4. 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 PROCEDURE: Using biplane roadmap guidance, a Red 62 aspiration catheter was navigated over Colossus 35 microguidewire into the cavernous segment of the left ICA. The aspiration catheter was then advanced to the level of occlusion and connected to an aspiration pump. Continuous aspiration was performed for 2 minutes. The guide catheter was connected to a VacLok syringe and the guiding catheter balloon was inflated. The aspiration catheter was subsequently removed under constant aspiration. The guide catheter was aspirated for debris. Left internal carotid artery angiograms with frontal and lateral views of the head showed complete recanalization of the left MCA vascular tree. The guide catheter  was retracted into the neck. Frontal and lateral angiograms of the neck were obtained. Improvement of the degree of stenosis in the carotid bulb compared to prior CT angiogram. There is prominent luminal irregularity at the carotid bulb residual moderate stenosis. Increased tortuosity of the proximal/mid cervical left ICA with kinking. Left internal carotid artery angiograms with left anterior oblique views of the neck showed evidence of filling defects at the level of the carotid bulb. Flat panel CT of the head was obtained and post processed in a separate workstation with concurrent attending physician supervision. Selected images were sent to PACS. No evidence of hemorrhagic complication. There is mild contrast staining of the left insular and frontal cortex. Repeat left internal carotid artery angiograms with frontal and lateral views of the head showed Amy seen left M3/MCA branch to the left parietal region. Left common carotid artery angiograms with frontal and lateral views of the neck showed vertebra Gretchen of stenosis at the left ICA bulb with more prominent filling defect. At this point, patient was loaded on cangrelor followed by continuous drip. Using biplane roadmap guidance, a 4-7 mm Emboshield NAV6 cerebral protection device was advanced into the cervical left ICA. However, multiple attempts to advance a cerebral protection device through the left ICA kinking proved unsuccessful. The cerebral protection device was subsequently removed. Using biplane roadmap guidance, a 10-8 x 40 mm XACT carotid stent was navigated and deployed from the distal left common carotid artery to the proximal left internal carotid artery, proximal to the vessel kinking. Suboptimal stent expansion was noted. Then, a 6 x 30 mm Viatrac balloon was navigated into the recently deployed stent. Angioplasty was performed under fluoroscopy. Left internal carotid artery angiograms with frontal and lateral views of the neck showed  adequate stent positioning and expansion with brisk anterograde flow. Left internal carotid artery angiograms with frontal and lateral views of the head showed improvement of anterograde flow in the left MCA vascular tree with brisk anterograde flow. Delayed left common carotid artery angiograms with frontal and lateral views of the neck showed no evidence of clot formation within the stent. The catheter was subsequently Riddle. Right common femoral artery angiogram was obtained in right anterior oblique view. The puncture is at the level of the common femoral artery. The artery has normal caliber, adequate for closure device. The sheath was exchanged over the wire for an 8 [place] Angio-Seal which was utilized for access closure. Immediate hemostasis was achieved. IMPRESSION: 1. Successful mechanical thrombectomy for treatment of a proximal left M2/MCA anterior division branch occlusion achieving complete recanalization (TICI 3). 2. 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 PLAN: Continue cangrelor infusion until patient is transitioned to oral dual antiplatelet therapy. Electronically  Signed   By: Baldemar Lenis M.D.   On: 06/05/2023 13:16   IR INTRAVSC STENT CERV CAROTID W/O EMB-PROT MOD SED Result Date: 06/05/2023 INDICATION: 83 year old male presenting with right-sided weakness and aphasia; NIHSS 28. His last known well was 10 p.m. on 06/04/2023. His past medical history significant for prior stroke, hypertension, diabetes and chronic kidney disease; baseline modified Rankin scale 0. Head CT showed hypodensity within the left insula, basal ganglia and left frontal operculum (ASPECTS 7). No IV thrombolytic given as patient was outside the window. CT angiogram of the head and neck showed an occlusion of a left M2/MCA anterior division branch. CT  perfusion showed a 41 mL core infarct with a 45 mL ischemic penumbra. She was transferred to our service for mechanical thrombectomy. EXAM: ULTRASOUND-GUIDED VASCULAR ACCESS DIAGNOSTIC CEREBRAL ANGIOGRAM MECHANICAL THROMBECTOMY FLAT PANEL HEAD CT LEFT CAROTID STENTING AND ANGIOPLASTY WITHOUT CEREBRAL PROTECTION DEVICE COMPARISON:  CT/CT angiogram of the head and neck June 05, 2023. MEDICATIONS: No antibiotics administered. ANESTHESIA/SEDATION: The procedure was performed under general anesthesia. CONTRAST:  80 mL of Omnipaque 300 milligram/mL FLUOROSCOPY: Radiation Exposure Index (as provided by the fluoroscopic device): 1315 mGy Kerma COMPLICATIONS: None immediate. TECHNIQUE: Informed written consent was obtained from the patient's wife after a thorough discussion of the procedural risks, benefits and alternatives. All questions were addressed. Maximal Sterile Barrier Technique was utilized including caps, mask, sterile gowns, sterile gloves, sterile drape, hand hygiene and skin antiseptic. A timeout was performed prior to the initiation of the procedure. The right groin was prepped and draped in the usual sterile fashion. Using a micropuncture kit and the modified Seldinger technique, access was gained to the right common femoral artery and an 8 French sheath was placed. Real-time ultrasound guidance was utilized for vascular access including the acquisition of a permanent ultrasound image documenting patency of the accessed vessel. Under fluoroscopy, an 8 [place] Walrus balloon guide catheter was navigated over a 6 [place] VTK catheter and a 0.035" Terumo Glidewire into the aortic arch. The catheter was placed into the left common carotid artery and then advanced into the left internal carotid artery. The diagnostic catheter was removed. Frontal and lateral angiograms of the head were obtained. FINDINGS: 1. Ultrasound showed heavily calcified right common femoral artery is maintained patency and caliber. 2.  Proximal occlusion of a left M2/MCA anterior division branch. 3. Atherosclerotic changes of the intracranial left ICA with mild stenosis at the distal cavernous segment. 4. 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 PROCEDURE: Using biplane roadmap guidance, a Red 62 aspiration catheter was navigated over Colossus 35 microguidewire into the cavernous segment of the left ICA. The aspiration catheter was then advanced to the level of occlusion and connected to an aspiration pump. Continuous aspiration was performed for 2 minutes. The guide catheter was connected to a VacLok syringe and the guiding catheter balloon was inflated. The aspiration catheter was subsequently removed under constant aspiration. The guide catheter was aspirated for debris. Left internal carotid artery angiograms with frontal and lateral views of the head showed complete recanalization of the left MCA vascular tree. The guide catheter was retracted into the neck. Frontal and lateral angiograms of the neck were obtained. Improvement of the degree of stenosis in the carotid bulb compared to prior CT angiogram. There is prominent luminal irregularity at the carotid bulb residual moderate stenosis. Increased tortuosity of the proximal/mid cervical left ICA with  kinking. Left internal carotid artery angiograms with left anterior oblique views of the neck showed evidence of filling defects at the level of the carotid bulb. Flat panel CT of the head was obtained and post processed in a separate workstation with concurrent attending physician supervision. Selected images were sent to PACS. No evidence of hemorrhagic complication. There is mild contrast staining of the left insular and frontal cortex. Repeat left internal carotid artery angiograms with frontal and lateral views of the head showed Amy seen left M3/MCA branch to the left parietal  region. Left common carotid artery angiograms with frontal and lateral views of the neck showed vertebra Gretchen of stenosis at the left ICA bulb with more prominent filling defect. At this point, patient was loaded on cangrelor followed by continuous drip. Using biplane roadmap guidance, a 4-7 mm Emboshield NAV6 cerebral protection device was advanced into the cervical left ICA. However, multiple attempts to advance a cerebral protection device through the left ICA kinking proved unsuccessful. The cerebral protection device was subsequently removed. Using biplane roadmap guidance, a 10-8 x 40 mm XACT carotid stent was navigated and deployed from the distal left common carotid artery to the proximal left internal carotid artery, proximal to the vessel kinking. Suboptimal stent expansion was noted. Then, a 6 x 30 mm Viatrac balloon was navigated into the recently deployed stent. Angioplasty was performed under fluoroscopy. Left internal carotid artery angiograms with frontal and lateral views of the neck showed adequate stent positioning and expansion with brisk anterograde flow. Left internal carotid artery angiograms with frontal and lateral views of the head showed improvement of anterograde flow in the left MCA vascular tree with brisk anterograde flow. Delayed left common carotid artery angiograms with frontal and lateral views of the neck showed no evidence of clot formation within the stent. The catheter was subsequently Riddle. Right common femoral artery angiogram was obtained in right anterior oblique view. The puncture is at the level of the common femoral artery. The artery has normal caliber, adequate for closure device. The sheath was exchanged over the wire for an 8 [place] Angio-Seal which was utilized for access closure. Immediate hemostasis was achieved. IMPRESSION: 1. Successful mechanical thrombectomy for treatment of a proximal left M2/MCA anterior division branch occlusion achieving complete  recanalization (TICI 3). 2. 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 PLAN: Continue cangrelor infusion until patient is transitioned to oral dual antiplatelet therapy. Electronically Signed   By: Baldemar Lenis M.D.   On: 06/05/2023 13:16   IR CT Head Ltd Result Date: 06/05/2023 INDICATION: 83 year old male presenting with right-sided weakness and aphasia; NIHSS 28. His last known well was 10 p.m. on 06/04/2023. His past medical history significant for prior stroke, hypertension, diabetes and chronic kidney disease; baseline modified Rankin scale 0. Head CT showed hypodensity within the left insula, basal ganglia and left frontal operculum (ASPECTS 7). No IV thrombolytic given as patient was outside the window. CT angiogram of the head and neck showed an occlusion of a left M2/MCA anterior division branch. CT perfusion showed a 41 mL core infarct with a 45 mL ischemic penumbra. She was transferred to our service for mechanical thrombectomy. EXAM: ULTRASOUND-GUIDED VASCULAR ACCESS DIAGNOSTIC CEREBRAL ANGIOGRAM MECHANICAL THROMBECTOMY FLAT PANEL HEAD CT LEFT CAROTID STENTING AND ANGIOPLASTY WITHOUT CEREBRAL PROTECTION DEVICE COMPARISON:  CT/CT angiogram of the head and neck June 05, 2023. MEDICATIONS: No antibiotics administered. ANESTHESIA/SEDATION:  The procedure was performed under general anesthesia. CONTRAST:  80 mL of Omnipaque 300 milligram/mL FLUOROSCOPY: Radiation Exposure Index (as provided by the fluoroscopic device): 1315 mGy Kerma COMPLICATIONS: None immediate. TECHNIQUE: Informed written consent was obtained from the patient's wife after a thorough discussion of the procedural risks, benefits and alternatives. All questions were addressed. Maximal Sterile Barrier Technique was utilized including caps, mask, sterile gowns, sterile gloves,  sterile drape, hand hygiene and skin antiseptic. A timeout was performed prior to the initiation of the procedure. The right groin was prepped and draped in the usual sterile fashion. Using a micropuncture kit and the modified Seldinger technique, access was gained to the right common femoral artery and an 8 French sheath was placed. Real-time ultrasound guidance was utilized for vascular access including the acquisition of a permanent ultrasound image documenting patency of the accessed vessel. Under fluoroscopy, an 8 [place] Walrus balloon guide catheter was navigated over a 6 [place] VTK catheter and a 0.035" Terumo Glidewire into the aortic arch. The catheter was placed into the left common carotid artery and then advanced into the left internal carotid artery. The diagnostic catheter was removed. Frontal and lateral angiograms of the head were obtained. FINDINGS: 1. Ultrasound showed heavily calcified right common femoral artery is maintained patency and caliber. 2. Proximal occlusion of a left M2/MCA anterior division branch. 3. Atherosclerotic changes of the intracranial left ICA with mild stenosis at the distal cavernous segment. 4. 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 PROCEDURE: Using biplane roadmap guidance, a Red 62 aspiration catheter was navigated over Colossus 35 microguidewire into the cavernous segment of the left ICA. The aspiration catheter was then advanced to the level of occlusion and connected to an aspiration pump. Continuous aspiration was performed for 2 minutes. The guide catheter was connected to a VacLok syringe and the guiding catheter balloon was inflated. The aspiration catheter was subsequently removed under constant aspiration. The guide catheter was aspirated for debris. Left internal carotid artery angiograms with frontal and lateral views of the head showed complete  recanalization of the left MCA vascular tree. The guide catheter was retracted into the neck. Frontal and lateral angiograms of the neck were obtained. Improvement of the degree of stenosis in the carotid bulb compared to prior CT angiogram. There is prominent luminal irregularity at the carotid bulb residual moderate stenosis. Increased tortuosity of the proximal/mid cervical left ICA with kinking. Left internal carotid artery angiograms with left anterior oblique views of the neck showed evidence of filling defects at the level of the carotid bulb. Flat panel CT of the head was obtained and post processed in a separate workstation with concurrent attending physician supervision. Selected images were sent to PACS. No evidence of hemorrhagic complication. There is mild contrast staining of the left insular and frontal cortex. Repeat left internal carotid artery angiograms with frontal and lateral views of the head showed Amy seen left M3/MCA branch to the left parietal region. Left common carotid artery angiograms with frontal and lateral views of the neck showed vertebra Gretchen of stenosis at the left ICA bulb with more prominent filling defect. At this point, patient was loaded on cangrelor followed by continuous drip. Using biplane roadmap guidance, a 4-7 mm Emboshield NAV6 cerebral protection device was advanced into the cervical left ICA. However, multiple attempts to advance a cerebral protection device through the left ICA kinking proved unsuccessful. The cerebral protection device  was subsequently removed. Using biplane roadmap guidance, a 10-8 x 40 mm XACT carotid stent was navigated and deployed from the distal left common carotid artery to the proximal left internal carotid artery, proximal to the vessel kinking. Suboptimal stent expansion was noted. Then, a 6 x 30 mm Viatrac balloon was navigated into the recently deployed stent. Angioplasty was performed under fluoroscopy. Left internal carotid artery  angiograms with frontal and lateral views of the neck showed adequate stent positioning and expansion with brisk anterograde flow. Left internal carotid artery angiograms with frontal and lateral views of the head showed improvement of anterograde flow in the left MCA vascular tree with brisk anterograde flow. Delayed left common carotid artery angiograms with frontal and lateral views of the neck showed no evidence of clot formation within the stent. The catheter was subsequently Riddle. Right common femoral artery angiogram was obtained in right anterior oblique view. The puncture is at the level of the common femoral artery. The artery has normal caliber, adequate for closure device. The sheath was exchanged over the wire for an 8 [place] Angio-Seal which was utilized for access closure. Immediate hemostasis was achieved. IMPRESSION: 1. Successful mechanical thrombectomy for treatment of a proximal left M2/MCA anterior division branch occlusion achieving complete recanalization (TICI 3). 2. 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 PLAN: Continue cangrelor infusion until patient is transitioned to oral dual antiplatelet therapy. Electronically Signed   By: Baldemar Lenis M.D.   On: 06/05/2023 13:16   CT ANGIO HEAD NECK W WO CM W PERF (CODE STROKE) Addendum Date: 06/05/2023 ADDENDUM REPORT: 06/05/2023 12:25 ADDENDUM: Please note, there is a dictation error within CTA neck impression #1, which should read: The common carotid and internal carotid arteries are patent within the neck. Atherosclerotic plaque bilaterally. Most notably, there is progressive atherosclerotic plaque about the left carotid bifurcation and within the proximal left ICA with resultant severe near occlusive stenosis of the proximal left ICA. Also of note, atherosclerotic plaque about  the right carotid bifurcation results in a 40% stenosis at the right ICA origin. Electronically Signed   By: Jackey Loge D.O.   On: 06/05/2023 12:25   Result Date: 06/05/2023 CLINICAL DATA:  Provided history: Cerebrovascular accident, unspecified mechanism. Right-sided weakness. Right-sided facial droop. Altered mental status. EXAM: CT ANGIOGRAPHY HEAD AND NECK CT PERFUSION BRAIN TECHNIQUE: Multidetector CT imaging of the head and neck was performed using the standard protocol during bolus administration of intravenous contrast. Multiplanar CT image reconstructions and MIPs were obtained to evaluate the vascular anatomy. Carotid stenosis measurements (when applicable) are obtained utilizing NASCET criteria, using the distal internal carotid diameter as the denominator. Multiphase CT imaging of the brain was performed following IV bolus contrast injection. Subsequent parametric perfusion maps were calculated using RAPID software. RADIATION DOSE REDUCTION: This exam was performed according to the departmental dose-optimization program which includes automated exposure control, adjustment of the mA and/or kV according to patient size and/or use of iterative reconstruction technique. CONTRAST:  OMNIPAQUE IOHEXOL 350 MG/ML SOLN COMPARISON:  Noncontrast head CT performed earlier today 06/05/2023. MRA head and MRA neck 10/04/2019. FINDINGS: CTA NECK FINDINGS Aortic arch: Common origin of the innominate and left common carotid arteries. Atherosclerotic plaque within the visualized thoracic aorta and proximal major branch vessels of the neck. Streak/beam hardening artifact arising from a dense right-sided contrast bolus partially obscures the right subclavian artery. Within this  limitation, there is no appreciable hemodynamically significant innominate or proximal subclavian artery stenosis. Right carotid system: CCA and ICA patent within the neck. Atherosclerotic plaque, greatest about the carotid bifurcation.  Resultant 40% stenosis at the ICA origin. Left carotid system: CCA and ICA patent within the neck. Atherosclerotic plaque. Most notably, there is prominent atherosclerotic plaque about the carotid bifurcation and within the proximal ICA which has progressed from the prior MRA neck of 10/04/2019. Resultant severe (near occlusive) stenosis of the proximal ICA. Tortuosity of the cervical ICA Vertebral arteries: The vertebral arteries are patent within the neck. Streak/beam hardening artifact limits evaluation of the right vertebral artery origin. At least moderate stenosis is suspected at this site. Atherosclerotic plaque scattered elsewhere within the cervical right vertebral artery with no more than mild stenosis. Calcified atherosclerotic plaque at the left vertebral artery origin with suspected at least moderate stenosis. Nonstenotic calcified plaque elsewhere within the cervical left vertebral artery. Skeleton: Cervical spondylosis. Other neck: No neck mass or cervical lymphadenopathy. Upper chest: No consolidation within the imaged lung apices. Review of the MIP images confirms the above findings CTA HEAD FINDINGS Anterior circulation: The intracranial internal carotid arteries are patent. As sclerotic plaque within both vessels. No more than mild stenosis on the right. Up to moderate stenosis within the left cavernous segment. The M1 middle cerebral arteries are patent. Abrupt occlusion of a proximal M2 left middle cerebral artery vessel (series 11, image 22). Atherosclerotic irregularity of the M2 and more distal MCA vessels elsewhere. The anterior cerebral arteries are patent. Atherosclerotic irregularity of both vessels without high-grade proximal stenosis. A possible 2 mm periophthalmic left ICA aneurysm with better appreciated on the prior MRA head of 10/04/2019. Posterior circulation: The intracranial vertebral arteries are patent. Atherosclerotic plaque within the right V4 segment sites of mild stenosis.  Non-stenotic atherosclerotic plaque within the left V4 segment. The basilar artery is patent. The posterior cerebral arteries are patent. Posterior communicating arteries are diminutive or absent, bilaterally. Venous sinuses: Assessment for dural venous sinus thrombosis is limited due to contrast timing. Anatomic variants: As described. Review of the MIP images confirms the above findings CT Brain Perfusion Findings: ASPECTS: CBF (<30%) Volume: 41mL Perfusion (Tmax>6.0s) volume: 86mL Mismatch Volume: 45mL Infarction Location:Left MCA vascular territory CTA head impression #1, the CT perfusion head impression and the presence of a severe stenosis of the proximal cervical left ICA called by telephone at the time of interpretation on 06/05/2023 at 8:40 am to provider [facility]IC [facility] , who verbally acknowledged these results. IMPRESSION: CTA neck: 1. No common carotid and internal carotid arteries are patent within the neck. Atherosclerotic plaque bilaterally. Most notably, there is progressive atherosclerotic plaque about the left carotid bifurcation and within the proximal left ICA with resultant severe, near occlusive stenosis of the proximal left ICA. Also of note, atherosclerotic plaque about the right carotid bifurcation results in 40% stenosis at the right ICA origin. 2. The vertebral arteries are patent within the neck. Atherosclerotic plaque bilaterally as described. Most notably, there is suspected at least moderate stenoses at the bilateral vertebral artery origins. 3. Aortic Atherosclerosis (ICD10-I70.0). CTA head: 1. Abrupt occlusion of a proximal M2 left middle cerebral artery vessel. 2. Background intracranial atherosclerotic disease as described. 3. A possible 2 mm periophthalmic left ICA aneurysm was better appreciated on the prior MRA head of 10/04/2019. CT perfusion head: The perfusion software identifies a 41 mL core infarct in the left MCA vascular territory. The perfusion software identifies an 86 mL  region of critically  hypoperfused parenchyma within the left MCA vascular territory (utilizing the Tmax>6 seconds threshold). Reported mismatch volume: 45 mL Electronically Signed: By: Jackey Loge D.O. On: 06/05/2023 09:07   CT HEAD CODE STROKE WO CONTRAST Result Date: 06/05/2023 CLINICAL DATA:  Code stroke. Neuro deficit, acute, stroke suspected. EXAM: CT HEAD WITHOUT CONTRAST TECHNIQUE: Contiguous axial images were obtained from the base of the skull through the vertex without intravenous contrast. RADIATION DOSE REDUCTION: This exam was performed according to the departmental dose-optimization program which includes automated exposure control, adjustment of the mA and/or kV according to patient size and/or use of iterative reconstruction technique. COMPARISON:  Brain MRI 10/04/2019.  Noncontrast head CT 10/03/2019. FINDINGS: Brain: Generalized cerebral atrophy. Loss of gray-white differentiation consistent with an acute infarct within the left insula and within portions of the left frontal operculum (MCA vascular territory). Known small chronic cortically-based infarcts within the left frontal, left parietal and left occipital lobes were better appreciated on the prior brain MRI of 10/04/2019 (acute at that time). Mild patchy and ill-defined hypoattenuation within the cerebral white matter, nonspecific but compatible with chronic small vessel ischemic disease. Subcentimeter infarct within the superior right cerebellar hemisphere, new from the prior MRI but chronic in appearance. Loss of gray-white differentiation there is no acute intracranial hemorrhage. No extra-axial fluid collection. No evidence of an intracranial mass. No midline shift. Vascular: No hyperdense vessel.  Atherosclerotic calcifications. Skull: No calvarial fracture or aggressive osseous lesion. Sinuses/Orbits: No mass or acute finding within the imaged orbits. No significant paranasal sinus disease. ASPECTS [facility] Stroke Program Early CT  Score) - Ganglionic level infarction (caudate, lentiform nuclei, internal capsule, insula, M1-M3 cortex): 5 - Supraganglionic infarction (M4-M6 cortex): 2 Total score (0-10 with 10 being normal): 7 Impression #1 called by telephone at the time of interpretation on 06/05/2023 at 8:40 am to provider Dr. Otelia Limes, who verbally acknowledged these results. IMPRESSION: 1. Acute left MCA territory infarct affecting the left insula and portions of the left frontal operculum. ASPECTS is 7. 2. Known small chronic cortically-based infarcts within the left frontal, left parietal and left occipital lobes were better appreciated on the prior brain MRI of 10/04/2019 (acute at that time). 3. Background mild cerebral white matter chronic small vessel ischemic disease. 4. Subcentimeter infarct within the right cerebellar hemisphere, new from prior MRI but chronic in appearance. 5. Generalized cerebral atrophy. Electronically Signed   By: Jackey Loge D.O.   On: 06/05/2023 08:45       HISTORY OF PRESENT ILLNESS 83 y.o. patient with history of hypertension, diabetes, CKD 3 and stroke was admitted with right-sided weakness, right-sided numbness, aphasia, right-sided facial droop and left gaze preference.  HOSPITAL COURSE Patient was found to be having a stroke and underwent mechanical thrombectomy with left ICA stenting.  Revascularization was successful, but patient did have some confluent hemorrhagic transformation.  Etiology of stroke is likely large vessel disease or cardioembolic in the setting of cardiomyopathy with low ejection fraction.  He is currently stable and ready to be transferred to SNF for rehabilitation.  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 status post IR with TICI3 and confluent HT, likely secondary to large vessel disease source versus cardiomyopathy with low EF CT left MCA infarct CT head and neck left M2 occlusion, left ICA near occlusion, right ICA 43 stenosis, bilateral  VA origin severe stenosis CTP 41/86 Status post IR with TICI3 and left ICA stenting MRI left MCA infarct at left insular and left frontal  operculum, prominent and confluent petechial hemorrhagic transformation CT repeat 2/7 stable confluent petechial hemorrhage 2D Echo EF 30% LDL 107 HgbA1c 6.1 UDS negative Heparin subcu for VTE prophylaxis aspirin 81 mg daily and clopidogrel 75 mg daily prior to admission, now on aspirin and Plavix post stenting. Patient will be counseled to be compliant with his antithrombotic medications Ongoing aggressive stroke risk factor management Therapy recommendations:  SNF Disposition: Pending   History of stroke 10/08/2019 admitted for left MCA infarct due to right upper extremity weakness.  MRA head and neck showed left ICA 30% stenosis.  EF 45 to 50%.  LDL 105, A1c 5.6.  Recommended loop recorder at that time but only got 30-day CardioNet monitoring which was no A-fib.  Discharged on DAPT and Lipitor 80.   Cardiomyopathy CHF 09/2019 EF 45 to 50% Current admission EF 30% Cardiology on board, appreciate Dr. Rennis Golden assistance On DAPT Agree with GDMT - would like to start losartan, entresto, and spironolactone as BP and Cr tolerates Metoprolol succinate 25mg  daily Lasix 80mg  x1 May consider loop recorder placement to rule out A-fib   Diabetes HgbA1c 6.1 goal < 7.0 Controlled CBG monitoring SSI.  Add Semglee 3 units daily and continue to monitor DM education and close PCP follow up   Hypertension Stable BP goal less than 160 given hemorrhagic transformation Long term BP goal normotensive   Hyperlipidemia Home meds: Lipitor 80 LDL 105, goal < 70 Now on lipitor 80 and Zetia Continue statin and at discharge   Dysphagia Poststroke dysphagia Speech on board Dysphagia 1 (Pureed) honey thick liquid. On core track-will DC core track as he is eating 100% of meals.  DC tube feeds.   Other Stroke Risk Factors Advanced age Obesity, Body mass index  is 39.22 kg/m.    Other Active Problems AKI on CKD3b, creatinine 1.60--1.66 -> 1.98>1.87-> 1.81 Hemoglobin 9.3 >9.7-> 10.0    RN Pressure Injury Documentation: Pressure Injury 06/09/23 Scrotum Left Stage 2 -  Partial thickness loss of dermis presenting as a shallow open injury with a red, pink wound bed without slough. (Active)  06/09/23 2330  Location: Scrotum  Location Orientation: Left  Staging: Stage 2 -  Partial thickness loss of dermis presenting as a shallow open injury with a red, pink wound bed without slough.  Wound Description (Comments):   Present on Admission:   Dressing Type Foam - Lift dressing to assess site every shift 06/10/23 2000     DISCHARGE EXAM  PHYSICAL EXAM General:  Alert, well-nourished, well-developed patient in no acute distress Psych:  Mood and affect appropriate for situation CV: Regular rate and rhythm on monitor Respiratory:  Regular, unlabored respirations on room air GI: Abdomen soft and nontender  NEURO:  Mental Status: AA&Ox3  Speech/Language: speech is without dysarthria or aphasia.  Naming, repetition, fluency, and comprehension intact.  Cranial Nerves:  II: PERRL. Visual fields full.  III, IV, VI: EOMI. Eyelids elevate symmetrically.  V: Sensation is intact to light touch and symmetrical to face.  VII: Smile is symmetrical.  VIII: hearing intact to voice. IX, X: Palate elevates symmetrically. Phonation is normal.  ZO:[id number] shrug 5/5. XII: tongue is midline without fasciculations. Motor: 5/5 strength to all muscle groups tested.  Tone: is normal and bulk is normal Sensation- Intact to light touch bilaterally. Extinction absent to light touch to DSS. Coordination: FTN intact bilaterally, HKS: no ataxia in BLE.No drift.  Gait- deferred  1a Level of Conscious.: *** 1b LOC Questions: *** 1c LOC Commands: *** 2 Best  Gaze: *** 3 Visual: *** 4 Facial Palsy: *** 5a Motor Arm - left: *** 5b Motor Arm - Right: *** 6a Motor Leg -  Left: *** 6b Motor Leg - Right: *** 7 Limb Ataxia: *** 8 Sensory: *** 9 Best Language: *** 10 Dysarthria: *** 11 Extinct. and Inatten.: *** TOTAL: ***   Discharge Diet       Diet   DIET - DYS 1 Fluid consistency: Honey Thick   liquids  DISCHARGE PLAN Disposition: Skilled nursing facility aspirin 81 mg daily and clopidogrel 75 mg daily for secondary stroke prevention for 6 months, then per interventional radiologist Ongoing stroke risk factor control by Primary Care Physician at time of discharge Follow-up PCP Fleet Contras, MD in 2 weeks. Follow-up in [facility] in 8 weeks, office to schedule an appointment.   *** minutes were spent preparing discharge.  .sign

## 2023-06-11 NOTE — Progress Notes (Signed)
Rounding Note    Patient Name: Nathan Proctor. Date of Encounter: 06/11/2023  [facility] Cardiologist: Chrystie Nose, MD   Subjective   Resting comfortably in bed, no new concerns.  Inpatient Medications    Scheduled Meds:  [START ON 06/12/2023] aspirin  81 mg Oral Daily   [START ON 06/12/2023] atorvastatin  80 mg Oral Daily   [START ON 06/12/2023] clopidogrel  75 mg Oral Daily   [START ON 06/12/2023] ezetimibe  10 mg Oral Daily   furosemide  40 mg Oral Daily   heparin injection (subcutaneous)  5,000 Units Subcutaneous Q8H   hydrALAZINE  10 mg Oral TID   insulin aspart  0-9 Units Subcutaneous Q4H   insulin glargine-yfgn  3 Units Subcutaneous Daily   isosorbide dinitrate  10 mg Oral TID   metoprolol tartrate  12.5 mg Oral BID   sodium chloride flush  3 mL Intravenous Once   spironolactone  25 mg Oral Daily   Continuous Infusions:  PRN Meds: acetaminophen **OR** acetaminophen (TYLENOL) oral liquid 160 mg/5 mL **OR** acetaminophen, hydrALAZINE, ipratropium-albuterol, labetalol, polyethylene glycol, senna-docusate   Vital Signs    Vitals:   06/11/23 0351 06/11/23 0752 06/11/23 1258 06/11/23 1518  BP:  133/68 93/72 113/73  Pulse:  84 91 92  Resp:  16 16 17   Temp:  98.4 F (36.9 C) 97.8 F (36.6 C) (!) 97.5 F (36.4 C)  TempSrc:  Oral Oral Oral  SpO2:  100% 100% 100%  Weight: 124 kg       Intake/Output Summary (Last 24 hours) at 06/11/2023 1952 Last data filed at 06/11/2023 1331 Gross per 24 hour  Intake 594 ml  Output 1100 ml  Net -506 ml      06/11/2023    3:51 AM 06/05/2023    8:00 AM 01/06/2023    5:07 PM  Last 3 Weights  Weight (lbs) 273 lb 5.9 oz 292 lb 5.3 oz 296 lb 15.4 oz  Weight (kg) 124 kg 132.6 kg 134.7 kg      Telemetry    SR - Personally Reviewed  Physical Exam   GEN: Well nourished, well developed in no acute distress NECK: No JVD CARDIAC: regular rhythm, normal S1 and S2, no rubs or gallops. No murmur. VASCULAR: Radial  pulses 2+ bilaterally.  RESPIRATORY:  Clear to auscultation without rales, wheezing or rhonchi  ABDOMEN: Soft, non-tender, non-distended MUSCULOSKELETAL:  Moves all 4 limbs independently SKIN: Warm and dry, no edema NEUROLOGIC: defer exam to neurology team PSYCHIATRIC:  Normal affect    New pertinent results (labs, ECG, imaging, cardiac studies)    Reviewed as below  Patient Profile     83 y.o. male with a past medical history of hypertension, type 2 diabetes, prior CVA who presented as a code stroke.  Cardiology is asked to see for acute on chronic systolic and diastolic heart failure.  He has been lost to cardiology follow-up since 2021  Assessment & Plan    Chronic systolic and diastolic heart failure, with question of acute exacerbation Acute kidney injury on chronic kidney disease stage 3a -Most recent echo shows LVEF of 30%, down from 45% in 2021 -Nuclear stress test in 2021 was concerning for prior infarct in inferior, inferolateral, and anteroseptal areas -Plan at that time was for cardiac catheterization, but patient did not present for this and was lost to follow-up -Given his nuclear stress test findings, it was presumed that this is likely an ischemic cardiomyopathy -given his acute CVA,  he is not a candidate for coronary angiography at this time  Med management: -Appears euvolemic. Made good urine on oral lasix today based on report.  -Continue spironolactone -Consolidated to metoprolol succinate 25 mg daily -given renal function, continue hydralazine/isordil 10/10 mg TID rather than ARB/ARNI at this time. Can reassess as an outpatient -holding Jardiance given limited mobility/risk of infection for now, but restart if more ambulatory at follow up  Acute kidney injury on chronic kidney disease stage 3a -Was on Jardiance as an outpatient as well as losartan. Once more ambulatory, renal function improved, consider restarting  CVA -On aspirin, clopidogrel, atorvastatin,  and ezetimibe  Cardiology will sign off. Continue medications as outlined above. We will arrange for outpatient cardiology follow up.    Signed, Jodelle Red, MD  06/11/2023, 7:52 PM

## 2023-06-11 NOTE — Progress Notes (Addendum)
 STROKE TEAM PROGRESS NOTE   SUBJECTIVE (INTERVAL HISTORY) Patient is seen in his room with no family at the bedside.  No changes.  He has remained afebrile with stable vital signs.  Neurological exam is unchanged.  He is awaiting placement at a SNF for rehab.  Serum creatinine remains elevated at 1.81.  WBC is normal.  Hematocrit stable at 31.6. OBJECTIVE Temp:  [97.8 F (36.6 C)-99.2 F (37.3 C)] 97.8 F (36.6 C) (02/11 1258) Pulse Rate:  [84-102] 91 (02/11 1258) Cardiac Rhythm: Normal sinus rhythm (02/11 0701) Resp:  [16-18] 16 (02/11 1258) BP: (93-146)/(64-78) 93/72 (02/11 1258) SpO2:  [94 %-100 %] 100 % (02/11 1258) Weight:  [124 kg] 124 kg (02/11 0351)  Recent Labs  Lab 06/10/23 2044 06/10/23 2334 06/11/23 0350 06/11/23 0757 06/11/23 1250  GLUCAP 300* 308* 133* 134* 249*   Recent Labs  Lab 06/05/23 1500 06/05/23 1811 06/06/23 0557 06/06/23 1648 06/07/23 0413 06/08/23 0556 06/09/23 0720 06/10/23 0613 06/11/23 0544  NA  --   --  139  --  143 145 144 147* 146*  K  --   --  5.0  --  4.2 4.2 4.2 4.3 3.8  CL  --   --  110  --  110 112* 113* 110 109  CO2  --   --  16*  --  20* 25 25 27 25   GLUCOSE  --   --  161*  --  163* 283* 290* 128* 128*  BUN  --   --  23  --  31* 42* 42* 43* 45*  CREATININE  --   --  1.66*  --  1.98* 2.03* 1.87* 1.78* 1.81*  CALCIUM  --   --  8.7*  --  9.5 8.8* 9.0 9.5 9.5  MG 1.8 1.9 2.3 2.2  --   --   --   --   --   PHOS 2.5 1.9* 3.0 3.4  --   --   --   --   --    Recent Labs  Lab 06/05/23 0815 06/06/23 0557  AST 20 26  ALT 13 12  ALKPHOS 76 72  BILITOT 1.2 1.1  PROT 6.5 6.1*  ALBUMIN 3.6 3.3*   Recent Labs  Lab 06/05/23 0815 06/05/23 0817 06/07/23 0413 06/08/23 0556 06/09/23 0720 06/10/23 0613 06/11/23 0544  WBC 6.8   < > 8.4 7.4 7.8 8.2 8.1  NEUTROABS 3.1  --   --   --   --   --   --   HGB 11.9*   < > 11.8* 9.3* 9.7* 10.4* 10.0*  HCT 37.8*   < > 37.1* 29.6* 31.2* 33.0* 31.6*  MCV 97.4   < > 97.4 96.7 97.8 97.9 96.6  PLT  181   < > 151 140* 144* 165 187   < > = values in this interval not displayed.   No results for input(s): "CKTOTAL", "CKMB", "CKMBINDEX", "TROPONINI" in the last 168 hours. No results for input(s): "LABPROT", "INR" in the last 72 hours.  No results for input(s): "COLORURINE", "LABSPEC", "PHURINE", "GLUCOSEU", "HGBUR", "BILIRUBINUR", "KETONESUR", "PROTEINUR", "UROBILINOGEN", "NITRITE", "LEUKOCYTESUR" in the last 72 hours.  Invalid input(s): "APPERANCEUR"      Component Value Date/Time   CHOL 183 06/06/2023 0557   TRIG 81 06/06/2023 0557   HDL 60 06/06/2023 0557   CHOLHDL 3.1 06/06/2023 0557   VLDL 16 06/06/2023 0557   LDLCALC 107 (H) 06/06/2023 0557   LDLCALC 223 (H) 05/31/2021 0000   Lab Results  Component Value Date   HGBA1C 6.1 (H) 06/05/2023      Component Value Date/Time   LABOPIA NONE DETECTED 06/05/2023 1653   COCAINSCRNUR NONE DETECTED 06/05/2023 1653   LABBENZ NONE DETECTED 06/05/2023 1653   AMPHETMU NONE DETECTED 06/05/2023 1653   THCU NONE DETECTED 06/05/2023 1653   LABBARB NONE DETECTED 06/05/2023 1653    Recent Labs  Lab 06/05/23 0815  ETH <10    I have personally reviewed the radiological images below and agree with the radiology interpretations.  DG Abd 1 View Result Date: 06/09/2023 CLINICAL DATA:  [id number]. Encounter for feeding tube placement. EXAM: ABDOMEN - 1 VIEW COMPARISON:  Abdomen film 06/05/2023. FINDINGS: Dobbhoff feeding tube is well placed with the radiopaque tip in the distal stomach. The visualized bowel pattern is nonobstructive. There is barium newly seen in the flexures and transverse colon. There is no supine evidence of free air. There is atelectasis in the lung bases. Cardiomegaly. IMPRESSION: 1. Dobbhoff feeding tube well placed with the radiopaque tip in the distal stomach. 2. Nonobstructive bowel gas pattern. 3. Cardiomegaly. Electronically Signed   By: Almira Bar M.D.   On: 06/09/2023 03:41   DG CHEST PORT 1 VIEW Result Date:  06/08/2023 CLINICAL DATA:  CHF EXAM: PORTABLE CHEST 1 VIEW COMPARISON:  01/06/2023 FINDINGS: Feeding tube in place. There is cardiomegaly with vascular congestion. Chronic elevation of the right hemidiaphragm with right base atelectasis. Interstitial prominence throughout the lungs, right greater than left could reflect interstitial edema. Possible small effusions. No acute bony abnormality. IMPRESSION: Cardiomegaly with vascular congestion and probable mild interstitial edema. Question small bilateral effusions. Chronic elevation of the right hemidiaphragm with right base atelectasis. Electronically Signed   By: Charlett Nose M.D.   On: 06/08/2023 18:20   DG Swallowing Func-Speech Pathology Result Date: 06/07/2023 Table formatting from the original result was not included. Modified Barium Swallow Study Study completed and documented by [name], SLP Student Supervised and reviewed by Harlon Ditty, MA CCC-SLP [facility] Secure Chat Preferred Office [phone] Patient Details Name: Nathan Proctor. MRN: [id number] Date of Birth: 04-12-1941 Today's Date: 06/07/2023 HPI/PMH: HPI: Pt is a 83 y.o. male who was admitted as a code stroke on 02/05 due to acute onset of right-sided weakness, right facial droop and aphasia. MRI from 02/06 displayed an evolving acute left MCA infarct, most pronounced at the left insula and left frontal operculum. Pt has intentionally lost 300 pounds in the recent past, with pre-loss weight at ~600Ib. PMHx includes hypertension, diabetes, CKD, prior CVA. Clinical Impression: Clinical Impression: Pt presented with a moderate-severe oropharyngeal dysphagia (DIGEST Score: 3) primarily characterized by silent aspiration of thin liquid, silent penetration of nectar-thick liquid, diffuse pharyngeal residue collection during thin/nectar-thick consistencies, and posterior-escape of bolus across multiple consistencies. Pt positioning was a potential limitation for  examination due to consistent posterior head position. More anterior head positioning during nectar-thick liquid displayed less pharyngeal residue, increased hyo-laryngeal excursion, and overall better airway safety. This positioning was not present throughout due to pt's receptive language deficits to understand cues. Alternating between honey-thick and puree consistencies provided more oropharyngeal bolus clearance and no signs of aspiration. Recommend a dysphagia 1 (puree) and honey-thick liquid diet to optimize pt's airway safety and potential for meeting nutritional needs. Slow rate, small bites/sips, natural head posturing, and multiple swallows are compensatory strategies that displayed best efficiency with pt. F/u with SLP for family education, compensatory strategy training, and upgraded diet trials is recommended. Factors that may increase risk  of adverse event in presence of aspiration [name] & Clearance [name] 2021): Factors that may increase risk of adverse event in presence of aspiration [name] & Clearance [name] 2021): Weak cough Recommendations/Plan: Swallowing Evaluation Recommendations Swallowing Evaluation Recommendations Recommendations: PO diet PO Diet Recommendation: Dysphagia 1 (Pureed); Moderately thick liquids (Level 3, honey thick) Liquid Administration via: Spoon Medication Administration: Crushed with puree Supervision: Full supervision/cueing for swallowing strategies; Full assist for feeding Swallowing strategies  : Slow rate; Small bites/sips; Multiple dry swallows after each bite/sip (Pt needs to be in a neutral head position (no posterior head tilt).) Postural changes: Position pt fully upright for meals; Stay upright 30-60 min after meals Oral care recommendations: Oral care BID (2x/day); Staff/trained caregiver to provide oral care Caregiver Recommendations: Have oral suction available Treatment Plan Treatment Plan Treatment recommendations: Therapy as outlined in treatment plan below Follow-up  recommendations: Follow physicians's recommendations for discharge plan and follow up therapies Functional status assessment: Patient has had a recent decline in their functional status and demonstrates the ability to make significant improvements in function in a reasonable and predictable amount of time. Treatment frequency: Min 2x/week Treatment duration: 2 weeks Interventions: Aspiration precaution training; Compensatory techniques; Patient/family education; Diet toleration management by SLP; Trials of upgraded texture/liquids Recommendations Recommendations for follow up therapy are one component of a multi-disciplinary discharge planning process, led by the attending physician.  Recommendations may be updated based on patient status, additional functional criteria and insurance authorization. Assessment: Orofacial Exam: Orofacial Exam Oral Cavity - Dentition: Adequate natural dentition Orofacial Anatomy: WFL Anatomy: Anatomy: WFL Boluses Administered: Boluses Administered Boluses Administered: Thin liquids (Level 0); Mildly thick liquids (Level 2, nectar thick); Moderately thick liquids (Level 3, honey thick); Puree; Solid  Oral Impairment Domain: Oral Impairment Domain Lip Closure: Escape progressing to mid-chin Tongue control during bolus hold: Posterior escape of greater than half of bolus Bolus preparation/mastication: -- (Pt did not chew solid and SLP had to remove it from mouth.) Bolus transport/lingual motion: Repetitive/disorganized tongue motion Oral residue: Residue collection on oral structures Location of oral residue : Tongue; Palate Initiation of pharyngeal swallow : Pyriform sinuses  Pharyngeal Impairment Domain: Pharyngeal Impairment Domain Soft palate elevation: No bolus between soft palate (SP)/pharyngeal wall (PW) Laryngeal elevation: Partial superior movement of thyroid cartilage/partial approximation of arytenoids to epiglottic petiole Anterior hyoid excursion: Partial anterior movement  Epiglottic movement: Partial inversion Laryngeal vestibule closure: Incomplete, narrow column air/contrast in laryngeal vestibule Pharyngeal stripping wave : Present - complete Pharyngeal contraction (A/P view only): N/A Pharyngoesophageal segment opening: Complete distension and complete duration, no obstruction of flow Tongue base retraction: Narrow column of contrast or air between tongue base and PPW Pharyngeal residue: Collection of residue within or on pharyngeal structures Location of pharyngeal residue: Diffuse (>3 areas)  Esophageal Impairment Domain: Esophageal Impairment Domain Esophageal clearance upright position: -- (Not tested.) Pill: No data recorded Penetration/Aspiration Scale Score: Penetration/Aspiration Scale Score 1.  Material does not enter airway: Puree; Moderately thick liquids (Level 3, honey thick) 3.  Material enters airway, remains ABOVE vocal cords and not ejected out: Mildly thick liquids (Level 2, nectar thick) 8.  Material enters airway, passes BELOW cords without attempt by patient to eject out (silent aspiration) : Thin liquids (Level 0) Compensatory Strategies: Compensatory Strategies Compensatory strategies: Yes Straw: Ineffective Multiple swallows: Effective (Required max cueing due to pt's language.) Effective Multiple Swallows: Puree; Moderately thick liquid (Level 3, honey thick); Mildly thick liquid (Level 2, nectar thick)   General Information: Caregiver present: No  Diet Prior to this  Study: NPO   Temperature : Normal   Respiratory Status: WFL   Supplemental O2: None (Room air)   No data recorded Behavior/Cognition: Alert; Cooperative; Pleasant mood; Requires cueing Self-Feeding Abilities: Needs assist with self-feeding Baseline vocal quality/speech: Dysphonic Volitional Cough: Unable to elicit Volitional Swallow: Able to elicit Exam Limitations: Poor positioning Goal Planning: Prognosis for improved oropharyngeal function: Good Barriers to Reach Goals: Language deficits  No data recorded No data recorded Consulted and agree with results and recommendations: Pt unable/family or caregiver not available Pain: Pain Assessment Pain Assessment: No/denies pain Faces Pain Scale: 0 Facial Expression: 0 Body Movements: 0 Muscle Tension: 0 Compliance with ventilator (intubated pts.): N/A Vocalization (extubated pts.): 0 CPOT Total: 0 End of Session: Start Time:SLP Start Time (ACUTE ONLY): 0903 Stop Time: SLP Stop Time (ACUTE ONLY): 0930 Time Calculation:SLP Time Calculation (min) (ACUTE ONLY): 27 min Charges: SLP Evaluations $ SLP Speech Visit: 1 Visit SLP Evaluations $BSS Swallow: 1 Procedure $MBS Swallow: 1 Procedure $ SLP EVAL LANGUAGE/SOUND PRODUCTION: 1 Procedure $Swallowing Treatment: 1 Procedure SLP visit diagnosis: SLP Visit Diagnosis: Dysphagia, oropharyngeal phase (R13.12) Past Medical History: Past Medical History: Diagnosis Date  Anemia of chronic disease   Ankle fracture 02/15/2016  Cancer (HCC)   Prostate  Chronic constipation   Chronic kidney disease   stage III  Diabetes mellitus without complication (HCC)   type II   Diabetic peripheral neuropathy (HCC)   Failure to thrive (0-17)   Fracture of left lower leg   Gout   Hyperlipidemia   Hypertension   Morbid obesity (HCC)   Unstable gait  Past Surgical History: Past Surgical History: Procedure Laterality Date  IR CT HEAD LTD  06/05/2023  IR INTRAVSC STENT CERV CAROTID W/O EMB-PROT MOD SED INC ANGIO  06/05/2023  IR PERCUTANEOUS ART THROMBECTOMY/INFUSION INTRACRANIAL INC DIAG ANGIO  06/05/2023  IR US GUIDE VASC ACCESS RIGHT  06/05/2023  ORIF ANKLE FRACTURE Left 02/29/2016  Procedure: OPEN REDUCTION INTERNAL FIXATION (ORIF) ANKLE FRACTURE;  Surgeon: Yolonda Kida, MD;  Location: [facility];  Service: Orthopedics;  Laterality: Left;  PROSTATE SURGERY    RADIOLOGY WITH ANESTHESIA N/A 06/05/2023  Procedure: IR WITH ANESTHESIA;  Surgeon: Radiologist, Medication, MD;  Location: [facility];  Service: Radiology;  Laterality: N/A; [place], Riley Nearing 06/07/2023, 12:15 PM  CT HEAD WO CONTRAST ( ) Result Date: 06/07/2023 CLINICAL DATA:  83 year old male status post code stroke presentation, left MCA M2 occlusion, with left MCA infarct with confluent petechial hemorrhage. EXAM: CT HEAD WITHOUT CONTRAST TECHNIQUE: Contiguous axial images were obtained from the base of the skull through the vertex without intravenous contrast. RADIATION DOSE REDUCTION: This exam was performed according to the departmental dose-optimization program which includes automated exposure control, adjustment of the mA and/or kV according to patient size and/or use of iterative reconstruction technique. COMPARISON:  Brain MRI yesterday.  Head CT 06/05/2023. FINDINGS: Brain: Confluent mixed density infarct in the left MCA middle division, epicenter at the insula and operculum. Confluent petechial hemorrhage on series 3, image 16 appears stable from the MRI. Superimposed trace subarachnoid blood is difficult to exclude by CT (series 3, image 13), but was not apparent on MRI. Stable mild mass effect on the left lateral ventricle with only trace rightward midline shift (series 3, image 16). No ventriculomegaly. Elsewhere gray-white differentiation is stable from the presentation CT. Basilar cisterns remain patent. Vascular: Resolved hyperdense left MCA since presentation. Calcified atherosclerosis at the skull base. Skull: Stable, intact. Sinuses/Orbits: Visualized paranasal sinuses and mastoids are stable and well  aerated. Other: Left nasoenteric tube now in place. Stable orbit and scalp soft tissues. IMPRESSION: 1. Stable by CT confluent Left MCA middle division infarct with petechial hemorrhage (Heidelberg classification 1b: HI2, confluent petechiae, no mass effect). Questionable trace superimposed SAH, but was not apparent on MRI. 2. Stable minimal intracranial mass effect. 3. No new intracranial abnormality. Electronically Signed   By: Odessa Fleming M.D.   On: 06/07/2023 05:37   MR  BRAIN WO CONTRAST Result Date: 06/06/2023 CLINICAL DATA:  Follow-up examination for stroke. EXAM: MRI HEAD WITHOUT CONTRAST TECHNIQUE: Multiplanar, multiecho pulse sequences of the brain and surrounding structures were obtained without intravenous contrast. COMPARISON:  Comparison made to multiple previous exams from 06/05/2023. FINDINGS: Brain: Examination degraded by motion artifact. Cerebral volume within normal limits for age. Patchy T2/FLAIR hyperintensity involving the periventricular deep white matter both cerebral hemispheres, consistent with chronic small vessel ischemic disease, mild in nature. Small remote infarct noted within the right cerebellum. Confluent restricted diffusion involving the left insula and left frontal operculum, consistent with evolving acute left MCA distribution infarct. Area of infarction measures up to approximately 6 cm in AP diameter. Prominent associated susceptibility artifact, consistent with petechial hemorrhage (series 7, image 61) ( Heidelberg classification 1b: HI2, confluent petechiae, no mass effect. No frank or organized hematoma evident by MRI. No significant regional mass effect. Few additional small foci of infarction noted within the left parieto-occipital region posteriorly (series 3, images 40, 29). Apparent diffusion signal along the right frontal parafalcine region felt to be consistent with artifact related to dural calcification. Note made of a few additional chronic micro hemorrhages within the left cerebellum and right thalamus. No mass lesion or midline shift. Ventricles normal size without hydrocephalus. No extra-axial fluid collection. Pituitary gland suprasellar region within normal limits. Vascular: Major intracranial vascular flow voids are maintained. Skull and upper cervical spine: Craniocervical junction within normal limits. Bone marrow signal intensity overall within normal limits. No scalp soft tissue abnormality. Sinuses/Orbits: Prior bilateral  ocular lens replacement. Paranasal sinuses are clear. No mastoid effusion. Other: None. IMPRESSION: 1. Evolving acute left MCA distribution infarct, most pronounced at the left insula and left frontal operculum. Prominent associated petechial hemorrhage without frank intraparenchymal hematoma (Heidelberg classification 1b: HI2, confluent petechiae, no mass effect). 2. Few additional small foci of acute infarction within the left parieto-occipital region posteriorly. 3. Underlying mild chronic microvascular ischemic disease with small remote right cerebellar infarct. Electronically Signed   By: Rise Mu M.D.   On: 06/06/2023 03:12   ECHOCARDIOGRAM COMPLETE Result Date: 06/05/2023    ECHOCARDIOGRAM REPORT   Patient Name:   Kayo Zion. Date of Exam: 06/05/2023 Medical Rec #:  [id number]              Height:       70.0 in Accession #:    [id number]             Weight:       292.3 lb Date of Birth:  05/29/1940              BSA:          2.453 m Patient Age:    82 years               BP:           129/72 mmHg Patient Gender: M                      HR:  89 bpm. Exam Location:  Inpatient Procedure: 2D Echo, Color Doppler, Cardiac Doppler and Intracardiac            Opacification Agent Indications:    Stroke I63.9  History:        Patient has prior history of Echocardiogram examinations, most                 recent 10/06/2019. Risk Factors:Diabetes, Hypertension and                 Dyslipidemia.  Sonographer:    Harriette Bouillon RDCS Referring Phys: Lynnae January IMPRESSIONS  1. Left ventricular ejection fraction, by estimation, is 30%. The left ventricle has moderately decreased function. The left ventricle demonstrates global hypokinesis. There is mild left ventricular hypertrophy.  2. Right ventricular systolic function is normal. The right ventricular size is normal.  3. Trivial mitral valve regurgitation.  4. The aortic valve is tricuspid. Aortic valve regurgitation is not visualized.  5. The  inferior vena cava is normal in size with greater than 50% respiratory variability, suggesting right atrial pressure of 3 mmHg. Comparison(s): The left ventricular function is worsened. FINDINGS  Left Ventricle: Left ventricular ejection fraction, by estimation, is 30%. The left ventricle has moderately decreased function. The left ventricle demonstrates global hypokinesis. Definity contrast agent was given IV to delineate the left ventricular endocardial borders. The left ventricular internal cavity size was normal in size. There is mild left ventricular hypertrophy. Right Ventricle: The right ventricular size is normal. Right vetricular wall thickness was not assessed. Right ventricular systolic function is normal. Left Atrium: Left atrial size was normal in size. Right Atrium: Right atrial size was normal in size. Pericardium: There is no evidence of pericardial effusion. Mitral Valve: There is mild thickening of the mitral valve leaflet(s). Mild to moderate mitral annular calcification. Trivial mitral valve regurgitation. Tricuspid Valve: The tricuspid valve is normal in structure. Tricuspid valve regurgitation is trivial. Aortic Valve: The aortic valve is tricuspid. Aortic valve regurgitation is not visualized. Pulmonic Valve: The pulmonic valve was normal in structure. Pulmonic valve regurgitation is not visualized. Aorta: The aortic root and ascending aorta are structurally normal, with no evidence of dilitation. Venous: The inferior vena cava is normal in size with greater than 50% respiratory variability, suggesting right atrial pressure of 3 mmHg. IAS/Shunts: The interatrial septum was not assessed.  LEFT VENTRICLE PLAX 2D LVIDd:         5.00 cm   Diastology LVIDs:         4.40 cm   LV e' lateral: 5.33 cm/s LV PW:         1.20 cm LV IVS:        1.10 cm LVOT diam:     2.30 cm LV SV:         55 LV SV Index:   23 LVOT Area:     4.15 cm  RIGHT VENTRICLE RV S prime:     14.40 cm/s LEFT ATRIUM         Index LA  diam:    4.60 cm 1.88 cm/m  AORTIC VALVE LVOT Vmax:   65.50 cm/s LVOT Vmean:  45.200 cm/s LVOT VTI:    0.133 m  AORTA Ao Root diam: 3.00 cm Ao Asc diam:  3.50 cm  SHUNTS Systemic VTI:  0.13 m Systemic Diam: 2.30 cm Dietrich Pates MD Electronically signed by Dietrich Pates MD Signature Date/Time: 06/05/2023/9:33:38 PM    Final    DG Abd Portable 1V  Result Date: 06/05/2023 CLINICAL DATA:  Feeding tube placement. EXAM: PORTABLE ABDOMEN - 1 VIEW COMPARISON:  None Available. FINDINGS: Distal tip of feeding tube is seen in expected position of distal stomach. IMPRESSION: Distal tip of feeding tube seen in expected position of distal stomach. Electronically Signed   By: Lupita Raider M.D.   On: 06/05/2023 15:49   CT HEAD WO CONTRAST Result Date: 06/05/2023 CLINICAL DATA:  Stroke, follow-up. Status post intracranial mechanical thrombectomy and stenting of the left ICA bifurcation. EXAM: CT HEAD WITHOUT CONTRAST TECHNIQUE: Contiguous axial images were obtained from the base of the skull through the vertex without intravenous contrast. RADIATION DOSE REDUCTION: This exam was performed according to the departmental dose-optimization program which includes automated exposure control, adjustment of the mA and/or kV according to patient size and/or use of iterative reconstruction technique. COMPARISON:  CT head without contrast and CT angio head and neck 06/05/2023. By plain CT 06/05/2023. FINDINGS: Brain: The study is mildly degraded by patient motion. The left insular and left opercular infarct is somewhat obscured by patient motion. The cortex is slightly hyperdense, potentially reflecting reperfusion. No hemorrhage is present. Basal ganglia are intact. Subcortical white matter hypoattenuation in the high left frontal lobe is new. Mild right-sided white matter disease is stable. Basal ganglia are intact. A remote lacunar infarct is again noted in the right cerebellum. The brainstem and cerebellum are otherwise within normal  limits. Vascular: Atherosclerotic calcifications are present within the cavernous internal carotid arteries and at the normal origin of both vertebral arteries. No hyperdense vessel is present. Skull: Calvarium is intact. No focal lytic or blastic lesions are present. No significant extracranial soft tissue lesion is present. Sinuses/Orbits: The paranasal sinuses and mastoid air cells are clear. Bilateral lens replacements are noted. Globes and orbits are otherwise unremarkable. IMPRESSION: 1. The left insular and left opercular infarct is somewhat obscured by patient motion. 2. The cortex is slightly hyperdense, potentially reflecting reperfusion. 3. No hemorrhage. 4. Subcortical white matter hypoattenuation in the high left frontal lobe is new. This may reflect ischemic changes or edema related to the reperfusion. 5. Remote lacunar infarct of the right cerebellum. 6. Stable mild right-sided white matter disease. This likely reflects the sequela of chronic microvascular ischemia. Electronically Signed   By: Marin Roberts M.D.   On: 06/05/2023 14:38   IR PERCUTANEOUS ART THROMBECTOMY/INFUSION INTRACRANIAL INC DIAG ANGIO Result Date: 06/05/2023 INDICATION: 83 year old male presenting with right-sided weakness and aphasia; NIHSS 28. His last known well was 10 p.m. on 06/04/2023. His past medical history significant for prior stroke, hypertension, diabetes and chronic kidney disease; baseline modified Rankin scale 0. Head CT showed hypodensity within the left insula, basal ganglia and left frontal operculum (ASPECTS 7). No IV thrombolytic given as patient was outside the window. CT angiogram of the head and neck showed an occlusion of a left M2/MCA anterior division branch. CT perfusion showed a 41 mL core infarct with a 45 mL ischemic penumbra. She was transferred to our service for mechanical thrombectomy. EXAM: ULTRASOUND-GUIDED VASCULAR ACCESS DIAGNOSTIC CEREBRAL ANGIOGRAM MECHANICAL THROMBECTOMY FLAT  PANEL HEAD CT LEFT CAROTID STENTING AND ANGIOPLASTY WITHOUT CEREBRAL PROTECTION DEVICE COMPARISON:  CT/CT angiogram of the head and neck June 05, 2023. MEDICATIONS: No antibiotics administered. ANESTHESIA/SEDATION: The procedure was performed under general anesthesia. CONTRAST:  80 mL of Omnipaque 300 milligram/mL FLUOROSCOPY: Radiation Exposure Index (as provided by the fluoroscopic device): 1315 mGy Kerma COMPLICATIONS: None immediate. TECHNIQUE: Informed written consent was obtained from the patient's wife after a  thorough discussion of the procedural risks, benefits and alternatives. All questions were addressed. Maximal Sterile Barrier Technique was utilized including caps, mask, sterile gowns, sterile gloves, sterile drape, hand hygiene and skin antiseptic. A timeout was performed prior to the initiation of the procedure. The right groin was prepped and draped in the usual sterile fashion. Using a micropuncture kit and the modified Seldinger technique, access was gained to the right common femoral artery and an 8 French sheath was placed. Real-time ultrasound guidance was utilized for vascular access including the acquisition of a permanent ultrasound image documenting patency of the accessed vessel. Under fluoroscopy, an 8 [place] Walrus balloon guide catheter was navigated over a 6 [place] VTK catheter and a 0.035" Terumo Glidewire into the aortic arch. The catheter was placed into the left common carotid artery and then advanced into the left internal carotid artery. The diagnostic catheter was removed. Frontal and lateral angiograms of the head were obtained. FINDINGS: 1. Ultrasound showed heavily calcified right common femoral artery is maintained patency and caliber. 2. Proximal occlusion of a left M2/MCA anterior division branch. 3. Atherosclerotic changes of the intracranial left ICA with mild stenosis at the distal cavernous segment. 4. 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 PROCEDURE: Using biplane roadmap guidance, a Red 62 aspiration catheter was navigated over Colossus 35 microguidewire into the cavernous segment of the left ICA. The aspiration catheter was then advanced to the level of occlusion and connected to an aspiration pump. Continuous aspiration was performed for 2 minutes. The guide catheter was connected to a VacLok syringe and the guiding catheter balloon was inflated. The aspiration catheter was subsequently removed under constant aspiration. The guide catheter was aspirated for debris. Left internal carotid artery angiograms with frontal and lateral views of the head showed complete recanalization of the left MCA vascular tree. The guide catheter was retracted into the neck. Frontal and lateral angiograms of the neck were obtained. Improvement of the degree of stenosis in the carotid bulb compared to prior CT angiogram. There is prominent luminal irregularity at the carotid bulb residual moderate stenosis. Increased tortuosity of the proximal/mid cervical left ICA with kinking. Left internal carotid artery angiograms with left anterior oblique views of the neck showed evidence of filling defects at the level of the carotid bulb. Flat panel CT of the head was obtained and post processed in a separate workstation with concurrent attending physician supervision. Selected images were sent to PACS. No evidence of hemorrhagic complication. There is mild contrast staining of the left insular and frontal cortex. Repeat left internal carotid artery angiograms with frontal and lateral views of the head showed Amy seen left M3/MCA branch to the left parietal region. Left common carotid artery angiograms with frontal and lateral views of the neck showed vertebra Gretchen of stenosis at the left ICA bulb with more prominent filling defect. At this point, patient was loaded on cangrelor followed by  continuous drip. Using biplane roadmap guidance, a 4-7 mm Emboshield NAV6 cerebral protection device was advanced into the cervical left ICA. However, multiple attempts to advance a cerebral protection device through the left ICA kinking proved unsuccessful. The cerebral protection device was subsequently removed. Using biplane roadmap guidance, a 10-8 x 40 mm XACT carotid stent was navigated and deployed from the distal left common carotid artery to the proximal left internal carotid artery, proximal to the vessel kinking. Suboptimal stent expansion was noted.  Then, a 6 x 30 mm Viatrac balloon was navigated into the recently deployed stent. Angioplasty was performed under fluoroscopy. Left internal carotid artery angiograms with frontal and lateral views of the neck showed adequate stent positioning and expansion with brisk anterograde flow. Left internal carotid artery angiograms with frontal and lateral views of the head showed improvement of anterograde flow in the left MCA vascular tree with brisk anterograde flow. Delayed left common carotid artery angiograms with frontal and lateral views of the neck showed no evidence of clot formation within the stent. The catheter was subsequently Riddle. Right common femoral artery angiogram was obtained in right anterior oblique view. The puncture is at the level of the common femoral artery. The artery has normal caliber, adequate for closure device. The sheath was exchanged over the wire for an 8 [place] Angio-Seal which was utilized for access closure. Immediate hemostasis was achieved. IMPRESSION: 1. Successful mechanical thrombectomy for treatment of a proximal left M2/MCA anterior division branch occlusion achieving complete recanalization (TICI 3). 2. 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 PLAN: Continue cangrelor  infusion until patient is transitioned to oral dual antiplatelet therapy. Electronically Signed   By: Baldemar Lenis M.D.   On: 06/05/2023 13:16   IR US Guide Vasc Access Right Result Date: 06/05/2023 INDICATION: 83 year old male presenting with right-sided weakness and aphasia; NIHSS 28. His last known well was 10 p.m. on 06/04/2023. His past medical history significant for prior stroke, hypertension, diabetes and chronic kidney disease; baseline modified Rankin scale 0. Head CT showed hypodensity within the left insula, basal ganglia and left frontal operculum (ASPECTS 7). No IV thrombolytic given as patient was outside the window. CT angiogram of the head and neck showed an occlusion of a left M2/MCA anterior division branch. CT perfusion showed a 41 mL core infarct with a 45 mL ischemic penumbra. She was transferred to our service for mechanical thrombectomy. EXAM: ULTRASOUND-GUIDED VASCULAR ACCESS DIAGNOSTIC CEREBRAL ANGIOGRAM MECHANICAL THROMBECTOMY FLAT PANEL HEAD CT LEFT CAROTID STENTING AND ANGIOPLASTY WITHOUT CEREBRAL PROTECTION DEVICE COMPARISON:  CT/CT angiogram of the head and neck June 05, 2023. MEDICATIONS: No antibiotics administered. ANESTHESIA/SEDATION: The procedure was performed under general anesthesia. CONTRAST:  80 mL of Omnipaque 300 milligram/mL FLUOROSCOPY: Radiation Exposure Index (as provided by the fluoroscopic device): 1315 mGy Kerma COMPLICATIONS: None immediate. TECHNIQUE: Informed written consent was obtained from the patient's wife after a thorough discussion of the procedural risks, benefits and alternatives. All questions were addressed. Maximal Sterile Barrier Technique was utilized including caps, mask, sterile gowns, sterile gloves, sterile drape, hand hygiene and skin antiseptic. A timeout was performed prior to the initiation of the procedure. The right groin was prepped and draped in the usual sterile fashion. Using a micropuncture kit and the modified  Seldinger technique, access was gained to the right common femoral artery and an 8 French sheath was placed. Real-time ultrasound guidance was utilized for vascular access including the acquisition of a permanent ultrasound image documenting patency of the accessed vessel. Under fluoroscopy, an 8 [place] Walrus balloon guide catheter was navigated over a 6 [place] VTK catheter and a 0.035" Terumo Glidewire into the aortic arch. The catheter was placed into the left common carotid artery and then advanced into the left internal carotid artery. The diagnostic catheter was removed. Frontal and lateral angiograms of the head were obtained. FINDINGS: 1. Ultrasound showed heavily calcified right common femoral  artery is maintained patency and caliber. 2. Proximal occlusion of a left M2/MCA anterior division branch. 3. Atherosclerotic changes of the intracranial left ICA with mild stenosis at the distal cavernous segment. 4. 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 PROCEDURE: Using biplane roadmap guidance, a Red 62 aspiration catheter was navigated over Colossus 35 microguidewire into the cavernous segment of the left ICA. The aspiration catheter was then advanced to the level of occlusion and connected to an aspiration pump. Continuous aspiration was performed for 2 minutes. The guide catheter was connected to a VacLok syringe and the guiding catheter balloon was inflated. The aspiration catheter was subsequently removed under constant aspiration. The guide catheter was aspirated for debris. Left internal carotid artery angiograms with frontal and lateral views of the head showed complete recanalization of the left MCA vascular tree. The guide catheter was retracted into the neck. Frontal and lateral angiograms of the neck were obtained. Improvement of the degree of stenosis in the carotid bulb compared to prior CT  angiogram. There is prominent luminal irregularity at the carotid bulb residual moderate stenosis. Increased tortuosity of the proximal/mid cervical left ICA with kinking. Left internal carotid artery angiograms with left anterior oblique views of the neck showed evidence of filling defects at the level of the carotid bulb. Flat panel CT of the head was obtained and post processed in a separate workstation with concurrent attending physician supervision. Selected images were sent to PACS. No evidence of hemorrhagic complication. There is mild contrast staining of the left insular and frontal cortex. Repeat left internal carotid artery angiograms with frontal and lateral views of the head showed Amy seen left M3/MCA branch to the left parietal region. Left common carotid artery angiograms with frontal and lateral views of the neck showed vertebra Gretchen of stenosis at the left ICA bulb with more prominent filling defect. At this point, patient was loaded on cangrelor followed by continuous drip. Using biplane roadmap guidance, a 4-7 mm Emboshield NAV6 cerebral protection device was advanced into the cervical left ICA. However, multiple attempts to advance a cerebral protection device through the left ICA kinking proved unsuccessful. The cerebral protection device was subsequently removed. Using biplane roadmap guidance, a 10-8 x 40 mm XACT carotid stent was navigated and deployed from the distal left common carotid artery to the proximal left internal carotid artery, proximal to the vessel kinking. Suboptimal stent expansion was noted. Then, a 6 x 30 mm Viatrac balloon was navigated into the recently deployed stent. Angioplasty was performed under fluoroscopy. Left internal carotid artery angiograms with frontal and lateral views of the neck showed adequate stent positioning and expansion with brisk anterograde flow. Left internal carotid artery angiograms with frontal and lateral views of the head showed  improvement of anterograde flow in the left MCA vascular tree with brisk anterograde flow. Delayed left common carotid artery angiograms with frontal and lateral views of the neck showed no evidence of clot formation within the stent. The catheter was subsequently Riddle. Right common femoral artery angiogram was obtained in right anterior oblique view. The puncture is at the level of the common femoral artery. The artery has normal caliber, adequate for closure device. The sheath was exchanged over the wire for an 8 [place] Angio-Seal which was utilized for access closure. Immediate hemostasis was achieved. IMPRESSION: 1. Successful mechanical thrombectomy for treatment of a proximal left M2/MCA anterior division branch occlusion achieving complete  recanalization (TICI 3). 2. 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 PLAN: Continue cangrelor infusion until patient is transitioned to oral dual antiplatelet therapy. Electronically Signed   By: Baldemar Lenis M.D.   On: 06/05/2023 13:16   IR INTRAVSC STENT CERV CAROTID W/O EMB-PROT MOD SED Result Date: 06/05/2023 INDICATION: 83 year old male presenting with right-sided weakness and aphasia; NIHSS 28. His last known well was 10 p.m. on 06/04/2023. His past medical history significant for prior stroke, hypertension, diabetes and chronic kidney disease; baseline modified Rankin scale 0. Head CT showed hypodensity within the left insula, basal ganglia and left frontal operculum (ASPECTS 7). No IV thrombolytic given as patient was outside the window. CT angiogram of the head and neck showed an occlusion of a left M2/MCA anterior division branch. CT perfusion showed a 41 mL core infarct with a 45 mL ischemic penumbra. She was transferred to our service for mechanical thrombectomy. EXAM: ULTRASOUND-GUIDED VASCULAR  ACCESS DIAGNOSTIC CEREBRAL ANGIOGRAM MECHANICAL THROMBECTOMY FLAT PANEL HEAD CT LEFT CAROTID STENTING AND ANGIOPLASTY WITHOUT CEREBRAL PROTECTION DEVICE COMPARISON:  CT/CT angiogram of the head and neck June 05, 2023. MEDICATIONS: No antibiotics administered. ANESTHESIA/SEDATION: The procedure was performed under general anesthesia. CONTRAST:  80 mL of Omnipaque 300 milligram/mL FLUOROSCOPY: Radiation Exposure Index (as provided by the fluoroscopic device): 1315 mGy Kerma COMPLICATIONS: None immediate. TECHNIQUE: Informed written consent was obtained from the patient's wife after a thorough discussion of the procedural risks, benefits and alternatives. All questions were addressed. Maximal Sterile Barrier Technique was utilized including caps, mask, sterile gowns, sterile gloves, sterile drape, hand hygiene and skin antiseptic. A timeout was performed prior to the initiation of the procedure. The right groin was prepped and draped in the usual sterile fashion. Using a micropuncture kit and the modified Seldinger technique, access was gained to the right common femoral artery and an 8 French sheath was placed. Real-time ultrasound guidance was utilized for vascular access including the acquisition of a permanent ultrasound image documenting patency of the accessed vessel. Under fluoroscopy, an 8 [place] Walrus balloon guide catheter was navigated over a 6 [place] VTK catheter and a 0.035" Terumo Glidewire into the aortic arch. The catheter was placed into the left common carotid artery and then advanced into the left internal carotid artery. The diagnostic catheter was removed. Frontal and lateral angiograms of the head were obtained. FINDINGS: 1. Ultrasound showed heavily calcified right common femoral artery is maintained patency and caliber. 2. Proximal occlusion of a left M2/MCA anterior division branch. 3. Atherosclerotic changes of the intracranial left ICA with mild stenosis at the distal cavernous segment.  4. 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 PROCEDURE: Using biplane roadmap guidance, a Red 62 aspiration catheter was navigated over Colossus 35 microguidewire into the cavernous segment of the left ICA. The aspiration catheter was then advanced to the level of occlusion and connected to an aspiration pump. Continuous aspiration was performed for 2 minutes. The guide catheter was connected to a VacLok syringe and the guiding catheter balloon was inflated. The aspiration catheter was subsequently removed under constant aspiration. The guide catheter was aspirated for debris. Left internal carotid artery angiograms with frontal and lateral views of the head showed complete recanalization of the left MCA vascular tree. The guide catheter was retracted into the neck. Frontal and  lateral angiograms of the neck were obtained. Improvement of the degree of stenosis in the carotid bulb compared to prior CT angiogram. There is prominent luminal irregularity at the carotid bulb residual moderate stenosis. Increased tortuosity of the proximal/mid cervical left ICA with kinking. Left internal carotid artery angiograms with left anterior oblique views of the neck showed evidence of filling defects at the level of the carotid bulb. Flat panel CT of the head was obtained and post processed in a separate workstation with concurrent attending physician supervision. Selected images were sent to PACS. No evidence of hemorrhagic complication. There is mild contrast staining of the left insular and frontal cortex. Repeat left internal carotid artery angiograms with frontal and lateral views of the head showed Amy seen left M3/MCA branch to the left parietal region. Left common carotid artery angiograms with frontal and lateral views of the neck showed vertebra Gretchen of stenosis at the left ICA bulb with more prominent filling  defect. At this point, patient was loaded on cangrelor followed by continuous drip. Using biplane roadmap guidance, a 4-7 mm Emboshield NAV6 cerebral protection device was advanced into the cervical left ICA. However, multiple attempts to advance a cerebral protection device through the left ICA kinking proved unsuccessful. The cerebral protection device was subsequently removed. Using biplane roadmap guidance, a 10-8 x 40 mm XACT carotid stent was navigated and deployed from the distal left common carotid artery to the proximal left internal carotid artery, proximal to the vessel kinking. Suboptimal stent expansion was noted. Then, a 6 x 30 mm Viatrac balloon was navigated into the recently deployed stent. Angioplasty was performed under fluoroscopy. Left internal carotid artery angiograms with frontal and lateral views of the neck showed adequate stent positioning and expansion with brisk anterograde flow. Left internal carotid artery angiograms with frontal and lateral views of the head showed improvement of anterograde flow in the left MCA vascular tree with brisk anterograde flow. Delayed left common carotid artery angiograms with frontal and lateral views of the neck showed no evidence of clot formation within the stent. The catheter was subsequently Riddle. Right common femoral artery angiogram was obtained in right anterior oblique view. The puncture is at the level of the common femoral artery. The artery has normal caliber, adequate for closure device. The sheath was exchanged over the wire for an 8 [place] Angio-Seal which was utilized for access closure. Immediate hemostasis was achieved. IMPRESSION: 1. Successful mechanical thrombectomy for treatment of a proximal left M2/MCA anterior division branch occlusion achieving complete recanalization (TICI 3). 2. Atherosclerotic disease of the left carotid bifurcation with stenosis and clot formation suggesting acute plaque rupture treated with stenting and  angioplasty with resolution of stenosis. PLAN: Continue cangrelor infusion until patient is transitioned to oral dual antiplatelet therapy. Electronically Signed   By: Baldemar Lenis M.D.   On: 06/05/2023 13:16   IR CT Head Ltd Result Date: 06/05/2023 INDICATION: 83 year old male presenting with right-sided weakness and aphasia; NIHSS 28. His last known well was 10 p.m. on 06/04/2023. His past medical history significant for prior stroke, hypertension, diabetes and chronic kidney disease; baseline modified Rankin scale 0. Head CT showed hypodensity within the left insula, basal ganglia and left frontal operculum (ASPECTS 7). No IV thrombolytic given as patient was outside the window. CT angiogram of the head and neck showed an occlusion of a left M2/MCA anterior division branch. CT perfusion showed a 41 mL core infarct with a 45 mL ischemic penumbra. She was transferred to our  service for mechanical thrombectomy. EXAM: ULTRASOUND-GUIDED VASCULAR ACCESS DIAGNOSTIC CEREBRAL ANGIOGRAM MECHANICAL THROMBECTOMY FLAT PANEL HEAD CT LEFT CAROTID STENTING AND ANGIOPLASTY WITHOUT CEREBRAL PROTECTION DEVICE COMPARISON:  CT/CT angiogram of the head and neck June 05, 2023. MEDICATIONS: No antibiotics administered. ANESTHESIA/SEDATION: The procedure was performed under general anesthesia. CONTRAST:  80 mL of Omnipaque 300 milligram/mL FLUOROSCOPY: Radiation Exposure Index (as provided by the fluoroscopic device): 1315 mGy Kerma COMPLICATIONS: None immediate. TECHNIQUE: Informed written consent was obtained from the patient's wife after a thorough discussion of the procedural risks, benefits and alternatives. All questions were addressed. Maximal Sterile Barrier Technique was utilized including caps, mask, sterile gowns, sterile gloves, sterile drape, hand hygiene and skin antiseptic. A timeout was performed prior to the initiation of the procedure. The right groin was prepped and draped in the usual sterile  fashion. Using a micropuncture kit and the modified Seldinger technique, access was gained to the right common femoral artery and an 8 French sheath was placed. Real-time ultrasound guidance was utilized for vascular access including the acquisition of a permanent ultrasound image documenting patency of the accessed vessel. Under fluoroscopy, an 8 [place] Walrus balloon guide catheter was navigated over a 6 [place] VTK catheter and a 0.035" Terumo Glidewire into the aortic arch. The catheter was placed into the left common carotid artery and then advanced into the left internal carotid artery. The diagnostic catheter was removed. Frontal and lateral angiograms of the head were obtained. FINDINGS: 1. Ultrasound showed heavily calcified right common femoral artery is maintained patency and caliber. 2. Proximal occlusion of a left M2/MCA anterior division branch. 3. Atherosclerotic changes of the intracranial left ICA with mild stenosis at the distal cavernous segment. 4. 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 PROCEDURE: Using biplane roadmap guidance, a Red 62 aspiration catheter was navigated over Colossus 35 microguidewire into the cavernous segment of the left ICA. The aspiration catheter was then advanced to the level of occlusion and connected to an aspiration pump. Continuous aspiration was performed for 2 minutes. The guide catheter was connected to a VacLok syringe and the guiding catheter balloon was inflated. The aspiration catheter was subsequently removed under constant aspiration. The guide catheter was aspirated for debris. Left internal carotid artery angiograms with frontal and lateral views of the head showed complete recanalization of the left MCA vascular tree. The guide catheter was retracted into the neck. Frontal and lateral angiograms of the neck were obtained. Improvement of the degree of  stenosis in the carotid bulb compared to prior CT angiogram. There is prominent luminal irregularity at the carotid bulb residual moderate stenosis. Increased tortuosity of the proximal/mid cervical left ICA with kinking. Left internal carotid artery angiograms with left anterior oblique views of the neck showed evidence of filling defects at the level of the carotid bulb. Flat panel CT of the head was obtained and post processed in a separate workstation with concurrent attending physician supervision. Selected images were sent to PACS. No evidence of hemorrhagic complication. There is mild contrast staining of the left insular and frontal cortex. Repeat left internal carotid artery angiograms with frontal and lateral views of the head showed Amy seen left M3/MCA branch to the left parietal region. Left common carotid artery angiograms with frontal and lateral views of the neck showed vertebra Gretchen of stenosis at the left ICA bulb with more prominent filling defect. At this point, patient was loaded on cangrelor  followed by continuous drip. Using biplane roadmap guidance, a 4-7 mm Emboshield NAV6 cerebral protection device was advanced into the cervical left ICA. However, multiple attempts to advance a cerebral protection device through the left ICA kinking proved unsuccessful. The cerebral protection device was subsequently removed. Using biplane roadmap guidance, a 10-8 x 40 mm XACT carotid stent was navigated and deployed from the distal left common carotid artery to the proximal left internal carotid artery, proximal to the vessel kinking. Suboptimal stent expansion was noted. Then, a 6 x 30 mm Viatrac balloon was navigated into the recently deployed stent. Angioplasty was performed under fluoroscopy. Left internal carotid artery angiograms with frontal and lateral views of the neck showed adequate stent positioning and expansion with brisk anterograde flow. Left internal carotid artery angiograms with  frontal and lateral views of the head showed improvement of anterograde flow in the left MCA vascular tree with brisk anterograde flow. Delayed left common carotid artery angiograms with frontal and lateral views of the neck showed no evidence of clot formation within the stent. The catheter was subsequently Riddle. Right common femoral artery angiogram was obtained in right anterior oblique view. The puncture is at the level of the common femoral artery. The artery has normal caliber, adequate for closure device. The sheath was exchanged over the wire for an 8 [place] Angio-Seal which was utilized for access closure. Immediate hemostasis was achieved. IMPRESSION: 1. Successful mechanical thrombectomy for treatment of a proximal left M2/MCA anterior division branch occlusion achieving complete recanalization (TICI 3). 2. 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 PLAN: Continue cangrelor infusion until patient is transitioned to oral dual antiplatelet therapy. Electronically Signed   By: Baldemar Lenis M.D.   On: 06/05/2023 13:16   CT ANGIO HEAD NECK W WO CM W PERF (CODE STROKE) Addendum Date: 06/05/2023 ADDENDUM REPORT: 06/05/2023 12:25 ADDENDUM: Please note, there is a dictation error within CTA neck impression #1, which should read: The common carotid and internal carotid arteries are patent within the neck. Atherosclerotic plaque bilaterally. Most notably, there is progressive atherosclerotic plaque about the left carotid bifurcation and within the proximal left ICA with resultant severe near occlusive stenosis of the proximal left ICA. Also of note, atherosclerotic plaque about the right carotid bifurcation results in a 40% stenosis at the right ICA origin. Electronically Signed   By: Jackey Loge D.O.   On: 06/05/2023 12:25   Result Date:  06/05/2023 CLINICAL DATA:  Provided history: Cerebrovascular accident, unspecified mechanism. Right-sided weakness. Right-sided facial droop. Altered mental status. EXAM: CT ANGIOGRAPHY HEAD AND NECK CT PERFUSION BRAIN TECHNIQUE: Multidetector CT imaging of the head and neck was performed using the standard protocol during bolus administration of intravenous contrast. Multiplanar CT image reconstructions and MIPs were obtained to evaluate the vascular anatomy. Carotid stenosis measurements (when applicable) are obtained utilizing NASCET criteria, using the distal internal carotid diameter as the denominator. Multiphase CT imaging of the brain was performed following IV bolus contrast injection. Subsequent parametric perfusion maps were calculated using RAPID software. RADIATION DOSE REDUCTION: This exam was performed according to the departmental dose-optimization program which includes automated exposure control, adjustment of the mA and/or kV according to patient size and/or use of iterative reconstruction technique. CONTRAST:  OMNIPAQUE IOHEXOL 350 MG/ML SOLN COMPARISON:  Noncontrast head CT performed earlier today 06/05/2023. MRA head and MRA neck 10/04/2019. FINDINGS: CTA NECK FINDINGS Aortic arch:  Common origin of the innominate and left common carotid arteries. Atherosclerotic plaque within the visualized thoracic aorta and proximal major branch vessels of the neck. Streak/beam hardening artifact arising from a dense right-sided contrast bolus partially obscures the right subclavian artery. Within this limitation, there is no appreciable hemodynamically significant innominate or proximal subclavian artery stenosis. Right carotid system: CCA and ICA patent within the neck. Atherosclerotic plaque, greatest about the carotid bifurcation. Resultant 40% stenosis at the ICA origin. Left carotid system: CCA and ICA patent within the neck. Atherosclerotic plaque. Most notably, there is prominent atherosclerotic  plaque about the carotid bifurcation and within the proximal ICA which has progressed from the prior MRA neck of 10/04/2019. Resultant severe (near occlusive) stenosis of the proximal ICA. Tortuosity of the cervical ICA Vertebral arteries: The vertebral arteries are patent within the neck. Streak/beam hardening artifact limits evaluation of the right vertebral artery origin. At least moderate stenosis is suspected at this site. Atherosclerotic plaque scattered elsewhere within the cervical right vertebral artery with no more than mild stenosis. Calcified atherosclerotic plaque at the left vertebral artery origin with suspected at least moderate stenosis. Nonstenotic calcified plaque elsewhere within the cervical left vertebral artery. Skeleton: Cervical spondylosis. Other neck: No neck mass or cervical lymphadenopathy. Upper chest: No consolidation within the imaged lung apices. Review of the MIP images confirms the above findings CTA HEAD FINDINGS Anterior circulation: The intracranial internal carotid arteries are patent. As sclerotic plaque within both vessels. No more than mild stenosis on the right. Up to moderate stenosis within the left cavernous segment. The M1 middle cerebral arteries are patent. Abrupt occlusion of a proximal M2 left middle cerebral artery vessel (series 11, image 22). Atherosclerotic irregularity of the M2 and more distal MCA vessels elsewhere. The anterior cerebral arteries are patent. Atherosclerotic irregularity of both vessels without high-grade proximal stenosis. A possible 2 mm periophthalmic left ICA aneurysm with better appreciated on the prior MRA head of 10/04/2019. Posterior circulation: The intracranial vertebral arteries are patent. Atherosclerotic plaque within the right V4 segment sites of mild stenosis. Non-stenotic atherosclerotic plaque within the left V4 segment. The basilar artery is patent. The posterior cerebral arteries are patent. Posterior communicating arteries  are diminutive or absent, bilaterally. Venous sinuses: Assessment for dural venous sinus thrombosis is limited due to contrast timing. Anatomic variants: As described. Review of the MIP images confirms the above findings CT Brain Perfusion Findings: ASPECTS: CBF (<30%) Volume: 41mL Perfusion (Tmax>6.0s) volume: 86mL Mismatch Volume: 45mL Infarction Location:Left MCA vascular territory CTA head impression #1, the CT perfusion head impression and the presence of a severe stenosis of the proximal cervical left ICA called by telephone at the time of interpretation on 06/05/2023 at 8:40 am to provider ERIC [facility] , who verbally acknowledged these results. IMPRESSION: CTA neck: 1. No common carotid and internal carotid arteries are patent within the neck. Atherosclerotic plaque bilaterally. Most notably, there is progressive atherosclerotic plaque about the left carotid bifurcation and within the proximal left ICA with resultant severe, near occlusive stenosis of the proximal left ICA. Also of note, atherosclerotic plaque about the right carotid bifurcation results in 40% stenosis at the right ICA origin. 2. The vertebral arteries are patent within the neck. Atherosclerotic plaque bilaterally as described. Most notably, there is suspected at least moderate stenoses at the bilateral vertebral artery origins. 3. Aortic Atherosclerosis (ICD10-I70.0). CTA head: 1. Abrupt occlusion of a proximal M2 left middle cerebral artery vessel. 2. Background intracranial atherosclerotic disease as described. 3. A possible 2 mm  periophthalmic left ICA aneurysm was better appreciated on the prior MRA head of 10/04/2019. CT perfusion head: The perfusion software identifies a 41 mL core infarct in the left MCA vascular territory. The perfusion software identifies an 86 mL region of critically hypoperfused parenchyma within the left MCA vascular territory (utilizing the Tmax>6 seconds threshold). Reported mismatch volume: 45 mL Electronically  Signed: By: Jackey Loge D.O. On: 06/05/2023 09:07   CT HEAD CODE STROKE WO CONTRAST Result Date: 06/05/2023 CLINICAL DATA:  Code stroke. Neuro deficit, acute, stroke suspected. EXAM: CT HEAD WITHOUT CONTRAST TECHNIQUE: Contiguous axial images were obtained from the base of the skull through the vertex without intravenous contrast. RADIATION DOSE REDUCTION: This exam was performed according to the departmental dose-optimization program which includes automated exposure control, adjustment of the mA and/or kV according to patient size and/or use of iterative reconstruction technique. COMPARISON:  Brain MRI 10/04/2019.  Noncontrast head CT 10/03/2019. FINDINGS: Brain: Generalized cerebral atrophy. Loss of gray-white differentiation consistent with an acute infarct within the left insula and within portions of the left frontal operculum (MCA vascular territory). Known small chronic cortically-based infarcts within the left frontal, left parietal and left occipital lobes were better appreciated on the prior brain MRI of 10/04/2019 (acute at that time). Mild patchy and ill-defined hypoattenuation within the cerebral white matter, nonspecific but compatible with chronic small vessel ischemic disease. Subcentimeter infarct within the superior right cerebellar hemisphere, new from the prior MRI but chronic in appearance. Loss of gray-white differentiation there is no acute intracranial hemorrhage. No extra-axial fluid collection. No evidence of an intracranial mass. No midline shift. Vascular: No hyperdense vessel.  Atherosclerotic calcifications. Skull: No calvarial fracture or aggressive osseous lesion. Sinuses/Orbits: No mass or acute finding within the imaged orbits. No significant paranasal sinus disease. ASPECTS [facility] Stroke Program Early CT Score) - Ganglionic level infarction (caudate, lentiform nuclei, internal capsule, insula, M1-M3 cortex): 5 - Supraganglionic infarction (M4-M6 cortex): 2 Total score (0-10  with 10 being normal): 7 Impression #1 called by telephone at the time of interpretation on 06/05/2023 at 8:40 am to provider Dr. Otelia Limes, who verbally acknowledged these results. IMPRESSION: 1. Acute left MCA territory infarct affecting the left insula and portions of the left frontal operculum. ASPECTS is 7. 2. Known small chronic cortically-based infarcts within the left frontal, left parietal and left occipital lobes were better appreciated on the prior brain MRI of 10/04/2019 (acute at that time). 3. Background mild cerebral white matter chronic small vessel ischemic disease. 4. Subcentimeter infarct within the right cerebellar hemisphere, new from prior MRI but chronic in appearance. 5. Generalized cerebral atrophy. Electronically Signed   By: Jackey Loge D.O.   On: 06/05/2023 08:45     PHYSICAL EXAM  Temp:  [97.8 F (36.6 C)-99.2 F (37.3 C)] 97.8 F (36.6 C) (02/11 1258) Pulse Rate:  [84-102] 91 (02/11 1258) Resp:  [16-18] 16 (02/11 1258) BP: (93-146)/(64-78) 93/72 (02/11 1258) SpO2:  [94 %-100 %] 100 % (02/11 1258) Weight:  [409 kg] 124 kg (02/11 0351)  General -well-nourished, well-developed elderly patient in no acute distress Sitting up in chair  Cardiovascular - Regular rhythm and rate.  Neuro - awake, alert, eyes open, orientated to age, place only.    Severe dysarthria, but following following commands.   No gaze palsy, tracking bilaterally, blinking to visual threat bilaterally.  Right facial droop. Tongue midline Bilateral UEs 4/5, minimal right drift.  Bilaterally LEs 4/5.  Sensation symmetric to light touch.  gait not tested.  ASSESSMENT/PLAN Nathan Proctor. is a 83 y.o. male with history of hypertension, diabetes, CKD 3, stroke admitted for right-sided weakness numbness, aphasia, right facial droop, left gaze preference. No TNK given due to outside window.  Patient underwent mechanical thrombectomy with left ICA stenting.  He is now awaiting placement  at a SNF for rehab.  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 status post IR with TICI3 and confluent HT, likely secondary to large vessel disease source versus cardiomyopathy with low EF CT left MCA infarct CT head and neck left M2 occlusion, left ICA near occlusion, right ICA 43 stenosis, bilateral VA origin severe stenosis CTP 41/86 Status post IR with TICI3 and left ICA stenting MRI left MCA infarct at left insular and left frontal operculum, prominent and confluent petechial hemorrhagic transformation CT repeat 2/7 stable confluent petechial hemorrhage 2D Echo EF 30% LDL 107 HgbA1c 6.1 UDS negative Heparin subcu for VTE prophylaxis aspirin 81 mg daily and clopidogrel 75 mg daily prior to admission, now on aspirin and Plavix post stenting. Patient will be counseled to be compliant with his antithrombotic medications Ongoing aggressive stroke risk factor management Therapy recommendations:  SNF Disposition: Pending  History of stroke 10/08/2019 admitted for left MCA infarct due to right upper extremity weakness.  MRA head and neck showed left ICA 30% stenosis.  EF 45 to 50%.  LDL 105, A1c 5.6.  Recommended loop recorder at that time but only got 30-day CardioNet monitoring which was no A-fib.  Discharged on DAPT and Lipitor 80.  Cardiomyopathy CHF 09/2019 EF 45 to 50% Current admission EF 30% Cardiology on board, appreciate Dr. Rennis Golden assistance On DAPT Agree with GDMT - would like to start losartan, entresto, and spironolactone as BP and Cr tolerates Metoprolol succinate 25mg  daily Lasix 80mg  x1 May consider loop recorder placement to rule out A-fib  Diabetes HgbA1c 6.1 goal < 7.0 Controlled CBG monitoring SSI.  Add Semglee 3 units daily and continue to monitor DM education and close PCP follow up  Hypertension Stable BP goal less than 160 given hemorrhagic transformation Long term BP goal normotensive  Hyperlipidemia Home meds: Lipitor  80 LDL 105, goal < 70 Now on lipitor 80 and Zetia Continue statin and at discharge  Dysphagia Poststroke dysphagia Speech on board Dysphagia 1 (Pureed) honey thick liquid. On core track-will DC core track as he is eating 100% of meals.  DC tube feeds.  Other Stroke Risk Factors Advanced age Obesity, Body mass index is 39.22 kg/m.   Other Active Problems AKI on CKD3b, creatinine 1.60--1.66 -> 1.98>1.87-> 1.81 Hemoglobin 9.3 >9.7-> 10.0  Hospital day # 6  Patient medically stable to be transferred to rehabilitation and skilled nursing facility when bed available.   Delia Heady ,   [facility] See Amion for schedule and pager information 06/11/2023 1:48 PM

## 2023-06-11 NOTE — Plan of Care (Signed)
  Problem: Education: Goal: Knowledge of disease or condition will improve Outcome: Progressing   Problem: Ischemic Stroke/TIA Tissue Perfusion: Goal: Complications of ischemic stroke/TIA will be minimized Outcome: Progressing   Problem: Health Behavior/Discharge Planning: Goal: Ability to manage health-related needs will improve Outcome: Progressing   Problem: Self-Care: Goal: Ability to participate in self-care as condition permits will improve Outcome: Progressing   Problem: Nutrition: Goal: Risk of aspiration will decrease Outcome: Progressing   Problem: Clinical Measurements: Goal: Ability to maintain clinical measurements within normal limits will improve Outcome: Progressing   Problem: Metabolic: Goal: Ability to maintain appropriate glucose levels will improve Outcome: Progressing   Problem: Skin Integrity: Goal: Risk for impaired skin integrity will decrease Outcome: Progressing

## 2023-06-11 NOTE — Progress Notes (Signed)
Physical Therapy Treatment Patient Details Name: Nathan Proctor. MRN: [id number] DOB: February 22, 1941 Today's Date: 06/11/2023   History of Present Illness 83 y.o. male who was brought in by EMS 06/05/23 as a code stroke due to acute onset of right-sided weakness, right facial droop and aphasia. CTH shows acute left MCA territory infarct; NIHSS 28; to IR for thrombectomy/stenting/angioplasty; PMH hypertension, diabetes, CKD, prior CVA with no residual deficits    PT Comments  Pt resting in bed on arrival and agreeable to session with steady progress, however pt limited by increased LE fatigue this session, continued bowel incontinence in standing, global weakness and impaired balance/postural reactions. Pt able to come to sitting EOB with CGA for safety and boost to stand with mod A with continued cues for hand placement on rise. Pt with short in room gait this session due to fatigue with grossly min A with RW for support to manage RW and maintain balance. Current plan remains appropriate to address deficits and maximize functional independence and decrease caregiver burden. Pt continues to benefit from skilled PT services to progress toward functional mobility goals.     If plan is discharge home, recommend the following: Two people to help with walking and/or transfers;Direct supervision/assist for medications management;Direct supervision/assist for financial management;Assist for transportation;Help with stairs or ramp for entrance;Supervision due to cognitive status   Can travel by private vehicle     No  Equipment Recommendations  None recommended by PT    Recommendations for Other Services       Precautions / Restrictions Precautions Precautions: Fall Precaution/Restrictions Comments: incontinent bowel/bladder; SBP 120-160 Restrictions Weight Bearing Restrictions Per Provider Order: No     Mobility  Bed Mobility Overal bed mobility: Needs Assistance Bed Mobility: Supine to  Sit     Supine to sit: Contact guard, HOB elevated, Used rails     General bed mobility comments: CGA for safety    Transfers Overall transfer level: Needs assistance Equipment used: Rolling walker (2 wheels) Transfers: Sit to/from Stand Sit to Stand: Mod assist           General transfer comment: mod cues for hand placement with all transitions; intial lifting help to stand though improved to less support with subsequent sit to stands    Ambulation/Gait Ambulation/Gait assistance: Min assist Gait Distance (Feet): 15 Feet Assistive device: Rolling walker (2 wheels) Gait Pattern/deviations: Step-to pattern, Step-through pattern, Decreased stride length, Shuffle, Trunk flexed Gait velocity: decr     General Gait Details: slow pace, mildly flexed at hips distance limited due to fatigue with noted knee flexion, pt not stating he needed to sit but when asked stating yes   Stairs             Wheelchair Mobility     Tilt Bed    Modified Rankin (Stroke Patients Only) Modified Rankin (Stroke Patients Only) Pre-Morbid Rankin Score: Moderate disability Modified Rankin: Moderately severe disability     Balance Overall balance assessment: Needs assistance Sitting-balance support: Feet supported Sitting balance-Leahy Scale: Fair Sitting balance - Comments: Pt able to maintain sitting balance without support statically but needs supervision when bending down to donn gripper socks   Standing balance support: Bilateral upper extremity supported Standing balance-Leahy Scale: Poor Standing balance comment: UE support for balance in standing                            Communication Communication Communication: Impaired Factors Affecting Communication: Other (comment) (  aphasia)  Cognition Arousal: Alert Behavior During Therapy: Flat affect   PT - Cognitive impairments: Memory, Attention, Initiation, Sequencing, Problem solving, Difficult to assess,  Safety/Judgement Difficult to assess due to: Impaired communication                     PT - Cognition Comments: needs multimodal cues for all mobility Following commands: Impaired Following commands impaired: Only follows one step commands consistently, Follows one step commands inconsistently    Cueing Cueing Techniques: Verbal cues, Gestural cues, Tactile cues  Exercises General Exercises - Lower Extremity Long Arc Quad: AROM, Right, Left, 10 reps, Seated Hip Flexion/Marching: AROM, Right, Left, 10 reps, Seated    General Comments General comments (skin integrity, edema, etc.): VSS      Pertinent Vitals/Pain Pain Assessment Pain Assessment: No/denies pain    Home Living                          Prior Function            PT Goals (current goals can now be found in the care plan section) Acute Rehab PT Goals Patient Stated Goal: unable tp state PT Goal Formulation: Patient unable to participate in goal setting Time For Goal Achievement: 06/20/23 Progress towards PT goals: Progressing toward goals    Frequency    Min 4X/week      PT Plan      Co-evaluation              AM-PAC PT "6 Clicks" Mobility   Outcome Measure  Help needed turning from your back to your side while in a flat bed without using bedrails?: A Little Help needed moving from lying on your back to sitting on the side of a flat bed without using bedrails?: A Lot Help needed moving to and from a bed to a chair (including a wheelchair)?: A Little Help needed standing up from a chair using your arms (e.g., wheelchair or bedside chair)?: A Lot Help needed to walk in hospital room?: A Lot Help needed climbing 3-5 steps with a railing? : Total 6 Click Score: 13    End of Session Equipment Utilized During Treatment: Gait belt Activity Tolerance: Patient tolerated treatment well;Patient limited by fatigue Patient left: with call bell/phone within reach;with chair alarm set;in  chair Nurse Communication: Mobility status PT Visit Diagnosis: Other abnormalities of gait and mobility (R26.89);Muscle weakness (generalized) (M62.81)     Time: 9562-1308 PT Time Calculation (min) (ACUTE ONLY): 21 min  Charges:    $Therapeutic Activity: 8-22 mins PT General Charges $$ ACUTE PT VISIT: 1 Visit                     Zinedine Ellner R. PTA Acute Rehabilitation Services Office: [phone]   Catalina Antigua 06/11/2023, 12:45 PM

## 2023-06-11 NOTE — Inpatient Diabetes Management (Signed)
Inpatient Diabetes Program Recommendations  AACE/ADA: New Consensus Statement on Inpatient Glycemic Control (2015)  Target Ranges:  Prepandial:   less than 140 mg/dL      Peak postprandial:   less than 180 mg/dL (1-2 hours)      Critically ill patients:  140 - 180 mg/dL   Lab Results  Component Value Date   GLUCAP 134 (H) 06/11/2023   HGBA1C 6.1 (H) 06/05/2023    Review of Glycemic Control  Latest Reference Range & Units 06/10/23 07:32 06/10/23 12:08 06/10/23 12:17 06/10/23 16:56 06/10/23 20:44 06/10/23 23:34 06/11/23 03:50 06/11/23 07:57  Glucose-Capillary 70 - 99 mg/dL 191 (H) 478 (H) 295 (H) 296 (H) 300 (H) 308 (H) 133 (H) 134 (H)   Diabetes history: DM 2 Outpatient Diabetes medications: 70/30 and Jardiance in the past Current orders for Inpatient glycemic control:  Semglee 3 units Daily Novolog 0-9 units Q4  Inpatient Diabetes Program Recommendations:    -   May consider Novolog 2 units tid meal coverage if eating >50% of meals  Thanks,  Christena Deem RN, MSN, BC-ADM Inpatient Diabetes Coordinator Team Pager [phone] (8a-5p)

## 2023-06-11 NOTE — Progress Notes (Signed)
Speech Language Pathology Treatment: Dysphagia;Cognitive-Linquistic  Patient Details Name: Nathan Proctor. MRN: [id number] DOB: [birth year] Today's Date: 06/11/2023 Time: 8657-8469 SLP Time Calculation (min) (ACUTE ONLY): 35 min  Assessment / Plan / Recommendation Clinical Impression  Pt was seen for dysphagia and aphasia tx. Pt was observed with lunch purees and only showed prolonged mastication and a single episode of delayed coughing during the session. Pt required verbal and visual cueing to engage in multiple swallows as a compensatory strategy. Pt maintained a neutral head position throughout session. Pt appears to still have issues with awareness of his environment.    Pt engaged in both expressive and receptive language tasks to facilitate functional communication. SLP utilized errorless learning and multimodal cueing. Receptive language is still a strength and pt followed two step basic verbal commands with 60-70% consistency. One step basic verbal commands were ~85-90% consistent. Yes/no questions were answered with ~50% consistency due to pt perseverating on "yes". Asking about or giving 2-3 explicit choices ("red" or "green") was more successful with pt than yes/no questions. Given two images with their name, pt was 100% consistent with pointing to image when given a very basic scenario ("which one would you use to go pee?") Expressively, pt started session by saying phrase-length utterances, transitioning to more word-length utterances by the end of session. Pt perseverated during object naming task.   SLP will f/u to continue monitoring pt's safety with current diet and to facilitate more aphasia tx along with family education.   HPI HPI: Pt is a 83 y.o. male who was admitted as a code stroke on 02/05 due to acute onset of right-sided weakness, right facial droop and aphasia. MRI from 02/06 displayed an evolving acute left MCA infarct, most pronounced at the left insula and left  frontal operculum. Pt has intentionally lost 300 pounds in the recent past, with pre-loss weight at ~600Ib. PMHx includes hypertension, diabetes, CKD, prior CVA.      SLP Plan  Continue with current plan of care      Recommendations for follow up therapy are one component of a multi-disciplinary discharge planning process, led by the attending physician.  Recommendations may be updated based on patient status, additional functional criteria and insurance authorization.    Recommendations  Supervision: Full supervision/cueing for compensatory strategies                      Frequent or constant Supervision/Assistance Dysphagia, oropharyngeal phase (R13.12);Aphasia (R47.01)     Continue with current plan of care     Nathan Proctor  06/11/2023, 2:44 PM

## 2023-06-12 ENCOUNTER — Inpatient Hospital Stay (HOSPITAL_COMMUNITY): Payer: No Typology Code available for payment source

## 2023-06-12 DIAGNOSIS — I639 Cerebral infarction, unspecified: Secondary | ICD-10-CM | POA: Diagnosis not present

## 2023-06-12 LAB — GLUCOSE, CAPILLARY
Glucose-Capillary: 133 mg/dL — ABNORMAL HIGH (ref 70–99)
Glucose-Capillary: 137 mg/dL — ABNORMAL HIGH (ref 70–99)
Glucose-Capillary: 181 mg/dL — ABNORMAL HIGH (ref 70–99)
Glucose-Capillary: 192 mg/dL — ABNORMAL HIGH (ref 70–99)
Glucose-Capillary: 192 mg/dL — ABNORMAL HIGH (ref 70–99)
Glucose-Capillary: 246 mg/dL — ABNORMAL HIGH (ref 70–99)
Glucose-Capillary: 265 mg/dL — ABNORMAL HIGH (ref 70–99)

## 2023-06-12 MED ORDER — ORAL CARE MOUTH RINSE: 15.0000 mL | OROMUCOSAL | Status: DC | PRN

## 2023-06-12 MED ORDER — ORAL CARE MOUTH RINSE
15.0000 mL | OROMUCOSAL | Status: DC
  Administered 2023-06-12 – 2023-06-15 (×13): 15 mL via OROMUCOSAL

## 2023-06-12 NOTE — Progress Notes (Signed)
Speech Language Pathology Treatment: Cognitive-Linquistic  Patient Details Name: Nathan Proctor. MRN: [id number] DOB: 03-Oct-1940 Today's Date: 06/12/2023 Time: 4098-1191 SLP Time Calculation (min) (ACUTE ONLY): 35 min  Assessment / Plan / Recommendation Clinical Impression  Pt was seen for aphasia tx to continue working towards functional communication. Pt engaged in expressive and receptive language tasks.   Pt utilized more phrase-length utterances during session compared to previous session; however, max multimodal cueing is required to engage pt to use phrases. Given 2-3 images with names, pt was 70% consistent with pointing to image when given a very basic scenario related to needs ("what would you use if you were very tired?"). Pt required errorless learning principles due to perseveration on certain images. Pt more readily initiated answers and phrase length utterances when SLP integrated natural conversation into tasks.   Recommend f/u SLP intervention focused on functional communication for wants/needs with more integrated conversation to increase pt's engagement and to facilitate natural language.   HPI HPI: Pt is a 83 y.o. male who was admitted as a code stroke on 02/05 due to acute onset of right-sided weakness, right facial droop and aphasia. MRI from 02/06 displayed an evolving acute left MCA infarct, most pronounced at the left insula and left frontal operculum. Pt has intentionally lost 300 pounds in the recent past, with pre-loss weight at ~600Ib. PMHx includes hypertension, diabetes, CKD, prior CVA.      SLP Plan  Continue with current plan of care      Recommendations for follow up therapy are one component of a multi-disciplinary discharge planning process, led by the attending physician.  Recommendations may be updated based on patient status, additional functional criteria and insurance authorization.    Recommendations                         Frequent  or constant Supervision/Assistance Aphasia (R47.01)     Continue with current plan of care     [name]  06/12/2023, 11:11 AM

## 2023-06-12 NOTE — Plan of Care (Signed)
  Problem: Education: Goal: Knowledge of disease or condition will improve Outcome: Progressing Goal: Knowledge of secondary prevention will improve (MUST DOCUMENT ALL) Outcome: Progressing Goal: Knowledge of patient specific risk factors will improve (DELETE if not current risk factor) Outcome: Progressing   Problem: Ischemic Stroke/TIA Tissue Perfusion: Goal: Complications of ischemic stroke/TIA will be minimized Outcome: Progressing   Problem: Coping: Goal: Will verbalize positive feelings about self Outcome: Progressing Goal: Will identify appropriate support needs Outcome: Progressing   Problem: Health Behavior/Discharge Planning: Goal: Ability to manage health-related needs will improve Outcome: Progressing Goal: Goals will be collaboratively established with patient/family Outcome: Progressing   Problem: Self-Care: Goal: Ability to participate in self-care as condition permits will improve Outcome: Progressing Goal: Verbalization of feelings and concerns over difficulty with self-care will improve Outcome: Progressing Goal: Ability to communicate needs accurately will improve Outcome: Progressing   Problem: Nutrition: Goal: Risk of aspiration will decrease Outcome: Progressing Goal: Dietary intake will improve Outcome: Progressing   Problem: Education: Goal: Understanding of CV disease, CV risk reduction, and recovery process will improve Outcome: Progressing Goal: Individualized Educational Video(s) Outcome: Progressing   Problem: Activity: Goal: Ability to return to baseline activity level will improve Outcome: Progressing   Problem: Cardiovascular: Goal: Ability to achieve and maintain adequate cardiovascular perfusion will improve Outcome: Progressing Goal: Vascular access site(s) Level 0-1 will be maintained Outcome: Progressing   Problem: Health Behavior/Discharge Planning: Goal: Ability to safely manage health-related needs after discharge will  improve Outcome: Progressing   Problem: Health Behavior/Discharge Planning: Goal: Ability to manage health-related needs will improve Outcome: Progressing   Problem: Clinical Measurements: Goal: Ability to maintain clinical measurements within normal limits will improve Outcome: Progressing Goal: Will remain free from infection Outcome: Progressing Goal: Diagnostic test results will improve Outcome: Progressing Goal: Respiratory complications will improve Outcome: Progressing Goal: Cardiovascular complication will be avoided Outcome: Progressing   Problem: Activity: Goal: Risk for activity intolerance will decrease Outcome: Progressing   Problem: Nutrition: Goal: Adequate nutrition will be maintained Outcome: Progressing   Problem: Coping: Goal: Level of anxiety will decrease Outcome: Progressing   Problem: Elimination: Goal: Will not experience complications related to bowel motility Outcome: Progressing Goal: Will not experience complications related to urinary retention Outcome: Progressing   Problem: Pain Managment: Goal: General experience of comfort will improve and/or be controlled Outcome: Progressing   Problem: Safety: Goal: Ability to remain free from injury will improve Outcome: Progressing   Problem: Skin Integrity: Goal: Risk for impaired skin integrity will decrease Outcome: Progressing   Problem: Education: Goal: Ability to describe self-care measures that may prevent or decrease complications (Diabetes Survival Skills Education) will improve Outcome: Progressing Goal: Individualized Educational Video(s) Outcome: Progressing   Problem: Coping: Goal: Ability to adjust to condition or change in health will improve Outcome: Progressing   Problem: Fluid Volume: Goal: Ability to maintain a balanced intake and output will improve Outcome: Progressing   Problem: Health Behavior/Discharge Planning: Goal: Ability to identify and utilize available  resources and services will improve Outcome: Progressing Goal: Ability to manage health-related needs will improve Outcome: Progressing

## 2023-06-12 NOTE — Evaluation (Signed)
Modified Barium Swallow Study  Patient Details  Name: Nathan Proctor. MRN: [id number] Date of Birth: [birth year]  Today's Date: 06/12/2023  Modified Barium Swallow completed.  Full report located under Chart Review in the Imaging Section.  History of Present Illness Pt is a 83 y.o. male who was admitted as a code stroke on 02/05 due to acute onset of right-sided weakness, right facial droop and aphasia. MRI from 02/06 displayed an evolving acute left MCA infarct, most pronounced at the left insula and left frontal operculum. Pt has intentionally lost 300 pounds in the recent past, with pre-loss weight at ~600Ib. PMHx includes hypertension, diabetes, CKD, prior CVA.   Clinical Impression Pt presents with a mild-moderate oropharyngeal dysphagia (DIGEST Score: 1) primarily characterized by silent penetration of thin liquid at level of VFs, flash penetration of nectar-thick above VFs, and diffuse pharyngeal residue. Pt's dysphagia severity level dropped from DIGEST score of 3 to 1, compared to previous MBS eval on 02/07.   Pharyngeal residue was present throughout all consistencies. Pt's posture for swallowing was consistently neutral. Pt follows commands for compensatory strategies more readily now with max multimodal cueing. Effective compensatory strategies include multiple swallows, throat clearing, and a straw when used with a verbally cued slow rate. Multiple consecutive sips of thin-liquid presented the most risk for aspiration due to silent penetration of Vfs. Unlike previous MBS study, pt tolerated solid consistency, displaying prolonged mastication but majority Nathan of bolus after 2-3 swallows.   Recommendations include upgrading pt's diet to dysphagia 2 (finely chopped) and thin liquids to optimize safety and efficiency for swallowing. SLP f/u to monitor tolerance of thin-liquids with compensatory strategies is highly recommended. Pt needs to remain upright for 30 minutes after PO  intake due to remaining pharyngeal residue to optimize airway safety.  Factors that may increase risk of adverse event in presence of aspiration Nathan Proctor & Nathan Proctor 2021): Dependence for feeding and/or oral hygiene  Swallow Evaluation Recommendations Recommendations: PO diet PO Diet Recommendation: Dysphagia 2 (Finely chopped);Thin liquids (Level 0) Liquid Administration via: Straw Medication Administration: Crushed with puree Supervision: Full supervision/cueing for swallowing strategies;Full assist for feeding Swallowing strategies  : Slow rate;Small bites/sips;Multiple dry swallows after each bite/sip;Clear throat intermittently Postural changes: Position pt fully upright for meals;Stay upright 30-60 min after meals Oral care recommendations: Oral care BID (2x/day);Staff/trained caregiver to provide oral care      Nathan Proctor 06/12/2023,3:12 PM

## 2023-06-12 NOTE — Progress Notes (Signed)
Occupational Therapy Treatment Patient Details Name: Nathan Proctor. MRN: [id number] DOB: [birth year] Today's Date: 06/12/2023   History of present illness 83 y.o. male who was brought in by EMS 06/05/23 as a code stroke due to acute onset of right-sided weakness, right facial droop and aphasia. CTH shows acute left MCA territory infarct; NIHSS 28; to IR for thrombectomy/stenting/angioplasty; PMH hypertension, diabetes, CKD, prior CVA with no residual deficits   OT comments  Pt currently at min to mod assist level for selfcare tasks sit to stand and for functional transfers with use of the RW for support.  Still with significant expressive deficits but following instructional commands related to selfcare and transfers well.  Feel he will continue to benefit from acute care OT at this time with transition to  inpatient follow up therapy, <3 hours/day as pt's spouse cannot provide much physical assist and pt will need to get a lot stronger before being ready to return home.         If plan is discharge home, recommend the following:  A little help with walking and/or transfers;A little help with bathing/dressing/bathroom;Assistance with cooking/housework;Direct supervision/assist for financial management;Supervision due to cognitive status;Assist for transportation;Direct supervision/assist for medications management;Help with stairs or ramp for entrance   Equipment Recommendations  Other (comment) (TBD next venue of care)       Precautions / Restrictions Precautions Precautions: Fall Precaution/Restrictions Comments: incontinent bowel/bladder; SBP 120-160 Restrictions Weight Bearing Restrictions Per Provider Order: No       Mobility Bed Mobility Overal bed mobility: Needs Assistance Bed Mobility: Supine to Sit     Supine to sit: Contact guard, HOB elevated, Used rails          Transfers Overall transfer level: Needs assistance Equipment used: Rolling walker (2  wheels) Transfers: Sit to/from Stand Sit to Stand: Min assist     Step pivot transfers: Min assist     General transfer comment: Mod instructional/demonstrational cueing for hand placement     Balance Overall balance assessment: Needs assistance Sitting-balance support: Feet supported Sitting balance-Leahy Scale: Fair Sitting balance - Comments: Pt able to sit EOB during bathing and dressing without LOB   Standing balance support: Bilateral upper extremity supported Standing balance-Leahy Scale: Poor Standing balance comment: BUE support needed on the RW                           ADL either performed or assessed with clinical judgement   ADL Overall ADL's : Needs assistance/impaired     Grooming: Wash/dry hands;Wash/dry face;Set up;Sitting   Upper Body Bathing: Supervision/ safety;Sitting   Lower Body Bathing: Moderate assistance;Sit to/from stand Lower Body Bathing Details (indicate cue type and reason): max assist for washing his buttocks secondary to bowel incontinence Upper Body Dressing : Moderate assistance;Sitting Upper Body Dressing Details (indicate cue type and reason): hospital gown Lower Body Dressing: Moderate assistance   Toilet Transfer: Minimal assistance;Ambulation;Rolling walker (2 wheels)   Toileting- Clothing Manipulation and Hygiene: Maximal assistance;Sit to/from stand Toileting - Clothing Manipulation Details (indicate cue type and reason): min assist for sit to stand with max assist for toilet hygiene     Functional mobility during ADLs: Minimal assistance;Rolling walker (2 wheels) General ADL Comments: Pt pleasant and coorperative, expressive deficits noted.  Able to sequence squeezing out washcloth and washing face when give min verbal cueing.  Mod instructional cueing overall for washing other parts of his body secondary to decreased thoroughness.  HR elevating  up to 118 BPM with activity.     Communication Communication Factors  Affecting Communication: Difficulty expressing self (expressive deficits noted)   Cognition Arousal: Alert Behavior During Therapy: Flat affect Cognition: Cognition impaired   Orientation impairments: Person, Place, Time, Situation Awareness: Intellectual awareness intact   Attention impairment (select first level of impairment): Sustained attention Executive functioning impairment (select all impairments): Problem solving, Reasoning OT - Cognition Comments: Pt able to follow commands consistently with regards to washing body parts when instructed.  Decreased thoroughness however with mod instructional cueing needed.  Pt unable to state place or pick correctly from three choices (hospital, bank, or church)                 Following commands: Impaired Following commands impaired: Only follows one step commands consistently, Follows multi-step commands with increased time      Cueing   Cueing Techniques: Verbal cues, Tactile cues             Pertinent Vitals/ Pain       Pain Assessment Pain Assessment: Faces Pain Score: 0-No pain         Frequency  Min 1X/week        Progress Toward Goals  OT Goals(current goals can now be found in the care plan section)  Progress towards OT goals: Progressing toward goals  Acute Rehab OT Goals Patient Stated Goal: Pt did not state but agreeable to working in therapy OT Goal Formulation: With patient/family Time For Goal Achievement: 06/20/23 Potential to Achieve Goals: Good  Plan         AM-PAC OT "6 Clicks" Daily Activity     Outcome Measure   Help from another person eating meals?: A Little Help from another person taking care of personal grooming?: A Little Help from another person toileting, which includes using toliet, bedpan, or urinal?: A Little Help from another person bathing (including washing, rinsing, drying)?: A Lot Help from another person to put on and taking off regular upper body clothing?: A  Little Help from another person to put on and taking off regular lower body clothing?: A Lot 6 Click Score: 16    End of Session Equipment Utilized During Treatment: Rolling walker (2 wheels)  OT Visit Diagnosis: Unsteadiness on feet (R26.81);Other abnormalities of gait and mobility (R26.89);Muscle weakness (generalized) (M62.81);Other symptoms and signs involving cognitive function;Cognitive communication deficit (R41.841) Symptoms and signs involving cognitive functions: Cerebral infarction   Activity Tolerance Patient tolerated treatment well   Patient Left in chair;with call bell/phone within reach;with chair alarm set   Nurse Communication Mobility status        Time: 1610-9604 OT Time Calculation (min): 47 min  Charges: OT General Charges $OT Visit: 1 Visit OT Treatments $Self Care/Home Management : 38-52 mins  Perrin Maltese, OTR/L Acute Rehabilitation Services  Office [phone] 06/12/2023

## 2023-06-12 NOTE — Progress Notes (Signed)
 STROKE TEAM PROGRESS NOTE   SUBJECTIVE (INTERVAL HISTORY) Patient is seen in his room with no family at the bedside.  No changes.  He has remained afebrile with stable vital signs.  Neurological exam is unchanged.  He is awaiting placement at a SNF for rehab.    OBJECTIVE Temp:  [97.5 F (36.4 C)-98.9 F (37.2 C)] 98.4 F (36.9 C) (02/12 1105) Pulse Rate:  [85-110] 110 (02/12 1105) Cardiac Rhythm: Normal sinus rhythm (02/12 0723) Resp:  [17-18] 17 (02/12 0434) BP: (113-150)/(61-83) 121/81 (02/12 1105) SpO2:  [95 %-100 %] 95 % (02/12 1105) Weight:  [123.4 kg] 123.4 kg (02/12 0500)  Recent Labs  Lab 06/11/23 2124 06/12/23 0026 06/12/23 0434 06/12/23 0819 06/12/23 1106  GLUCAP 225* 181* 137* 133* 246*   Recent Labs  Lab 06/05/23 1500 06/05/23 1811 06/06/23 0557 06/06/23 0557 06/06/23 1648 06/07/23 0413 06/08/23 0556 06/09/23 0720 06/10/23 0613 06/11/23 0544  NA  --   --  139   < >  --  143 145 144 147* 146*  K  --   --  5.0   < >  --  4.2 4.2 4.2 4.3 3.8  CL  --   --  110   < >  --  110 112* 113* 110 109  CO2  --   --  16*   < >  --  20* 25 25 27 25   GLUCOSE  --   --  161*   < >  --  163* 283* 290* 128* 128*  BUN  --   --  23   < >  --  31* 42* 42* 43* 45*  CREATININE  --   --  1.66*   < >  --  1.98* 2.03* 1.87* 1.78* 1.81*  CALCIUM  --   --  8.7*   < >  --  9.5 8.8* 9.0 9.5 9.5  MG 1.8 1.9 2.3  --  2.2  --   --   --   --   --   PHOS 2.5 1.9* 3.0  --  3.4  --   --   --   --   --    < > = values in this interval not displayed.   Recent Labs  Lab 06/06/23 0557  AST 26  ALT 12  ALKPHOS 72  BILITOT 1.1  PROT 6.1*  ALBUMIN 3.3*   Recent Labs  Lab 06/07/23 0413 06/08/23 0556 06/09/23 0720 06/10/23 0613 06/11/23 0544  WBC 8.4 7.4 7.8 8.2 8.1  HGB 11.8* 9.3* 9.7* 10.4* 10.0*  HCT 37.1* 29.6* 31.2* 33.0* 31.6*  MCV 97.4 96.7 97.8 97.9 96.6  PLT 151 140* 144* 165 187   No results for input(s): "CKTOTAL", "CKMB", "CKMBINDEX", "TROPONINI" in the last 168  hours. No results for input(s): "LABPROT", "INR" in the last 72 hours.  No results for input(s): "COLORURINE", "LABSPEC", "PHURINE", "GLUCOSEU", "HGBUR", "BILIRUBINUR", "KETONESUR", "PROTEINUR", "UROBILINOGEN", "NITRITE", "LEUKOCYTESUR" in the last 72 hours.  Invalid input(s): "APPERANCEUR"      Component Value Date/Time   CHOL 183 06/06/2023 0557   TRIG 81 06/06/2023 0557   HDL 60 06/06/2023 0557   CHOLHDL 3.1 06/06/2023 0557   VLDL 16 06/06/2023 0557   LDLCALC 107 (H) 06/06/2023 0557   LDLCALC 223 (H) 05/31/2021 0000   Lab Results  Component Value Date   HGBA1C 6.1 (H) 06/05/2023      Component Value Date/Time   LABOPIA NONE DETECTED 06/05/2023 1653   COCAINSCRNUR NONE DETECTED 06/05/2023  1653   LABBENZ NONE DETECTED 06/05/2023 1653   AMPHETMU NONE DETECTED 06/05/2023 1653   THCU NONE DETECTED 06/05/2023 1653   LABBARB NONE DETECTED 06/05/2023 1653    No results for input(s): "ETH" in the last 168 hours.   I have personally reviewed the radiological images below and agree with the radiology interpretations.  DG Abd 1 View Result Date: 06/09/2023 CLINICAL DATA:  [id number]. Encounter for feeding tube placement. EXAM: ABDOMEN - 1 VIEW COMPARISON:  Abdomen film 06/05/2023. FINDINGS: Dobbhoff feeding tube is well placed with the radiopaque tip in the distal stomach. The visualized bowel pattern is nonobstructive. There is barium newly seen in the flexures and transverse colon. There is no supine evidence of free air. There is atelectasis in the lung bases. Cardiomegaly. IMPRESSION: 1. Dobbhoff feeding tube well placed with the radiopaque tip in the distal stomach. 2. Nonobstructive bowel gas pattern. 3. Cardiomegaly. Electronically Signed   By: Almira Bar M.D.   On: 06/09/2023 03:41   DG CHEST PORT 1 VIEW Result Date: 06/08/2023 CLINICAL DATA:  CHF EXAM: PORTABLE CHEST 1 VIEW COMPARISON:  01/06/2023 FINDINGS: Feeding tube in place. There is cardiomegaly with vascular congestion.  Chronic elevation of the right hemidiaphragm with right base atelectasis. Interstitial prominence throughout the lungs, right greater than left could reflect interstitial edema. Possible small effusions. No acute bony abnormality. IMPRESSION: Cardiomegaly with vascular congestion and probable mild interstitial edema. Question small bilateral effusions. Chronic elevation of the right hemidiaphragm with right base atelectasis. Electronically Signed   By: Charlett Nose M.D.   On: 06/08/2023 18:20   DG Swallowing Func-Speech Pathology Result Date: 06/07/2023 Table formatting from the original result was not included. Modified Barium Swallow Study Study completed and documented by [name], SLP Student Supervised and reviewed by Harlon Ditty, MA CCC-SLP [facility] Secure Chat Preferred Office [phone] Patient Details Name: Nathan Proctor. MRN: [id number] Date of Birth: 09-09-1940 Today's Date: 06/07/2023 HPI/PMH: HPI: Pt is a 83 y.o. male who was admitted as a code stroke on 02/05 due to acute onset of right-sided weakness, right facial droop and aphasia. MRI from 02/06 displayed an evolving acute left MCA infarct, most pronounced at the left insula and left frontal operculum. Pt has intentionally lost 300 pounds in the recent past, with pre-loss weight at ~600Ib. PMHx includes hypertension, diabetes, CKD, prior CVA. Clinical Impression: Clinical Impression: Pt presented with a moderate-severe oropharyngeal dysphagia (DIGEST Score: 3) primarily characterized by silent aspiration of thin liquid, silent penetration of nectar-thick liquid, diffuse pharyngeal residue collection during thin/nectar-thick consistencies, and posterior-escape of bolus across multiple consistencies. Pt positioning was a potential limitation for examination due to consistent posterior head position. More anterior head positioning during nectar-thick liquid displayed less pharyngeal residue, increased hyo-laryngeal  excursion, and overall better airway safety. This positioning was not present throughout due to pt's receptive language deficits to understand cues. Alternating between honey-thick and puree consistencies provided more oropharyngeal bolus clearance and no signs of aspiration. Recommend a dysphagia 1 (puree) and honey-thick liquid diet to optimize pt's airway safety and potential for meeting nutritional needs. Slow rate, small bites/sips, natural head posturing, and multiple swallows are compensatory strategies that displayed best efficiency with pt. F/u with SLP for family education, compensatory strategy training, and upgraded diet trials is recommended. Factors that may increase risk of adverse event in presence of aspiration [name] & Clearance [name] 2021): Factors that may increase risk of adverse event in presence of aspiration [name] & Clearance [name] 2021): Weak cough Recommendations/Plan:  Swallowing Evaluation Recommendations Swallowing Evaluation Recommendations Recommendations: PO diet PO Diet Recommendation: Dysphagia 1 (Pureed); Moderately thick liquids (Level 3, honey thick) Liquid Administration via: Spoon Medication Administration: Crushed with puree Supervision: Full supervision/cueing for swallowing strategies; Full assist for feeding Swallowing strategies  : Slow rate; Small bites/sips; Multiple dry swallows after each bite/sip (Pt needs to be in a neutral head position (no posterior head tilt).) Postural changes: Position pt fully upright for meals; Stay upright 30-60 min after meals Oral care recommendations: Oral care BID (2x/day); Staff/trained caregiver to provide oral care Caregiver Recommendations: Have oral suction available Treatment Plan Treatment Plan Treatment recommendations: Therapy as outlined in treatment plan below Follow-up recommendations: Follow physicians's recommendations for discharge plan and follow up therapies Functional status assessment: Patient has had a recent decline in their  functional status and demonstrates the ability to make significant improvements in function in a reasonable and predictable amount of time. Treatment frequency: Min 2x/week Treatment duration: 2 weeks Interventions: Aspiration precaution training; Compensatory techniques; Patient/family education; Diet toleration management by SLP; Trials of upgraded texture/liquids Recommendations Recommendations for follow up therapy are one component of a multi-disciplinary discharge planning process, led by the attending physician.  Recommendations may be updated based on patient status, additional functional criteria and insurance authorization. Assessment: Orofacial Exam: Orofacial Exam Oral Cavity - Dentition: Adequate natural dentition Orofacial Anatomy: WFL Anatomy: Anatomy: WFL Boluses Administered: Boluses Administered Boluses Administered: Thin liquids (Level 0); Mildly thick liquids (Level 2, nectar thick); Moderately thick liquids (Level 3, honey thick); Puree; Solid  Oral Impairment Domain: Oral Impairment Domain Lip Closure: Escape progressing to mid-chin Tongue control during bolus hold: Posterior escape of greater than half of bolus Bolus preparation/mastication: -- (Pt did not chew solid and SLP had to remove it from mouth.) Bolus transport/lingual motion: Repetitive/disorganized tongue motion Oral residue: Residue collection on oral structures Location of oral residue : Tongue; Palate Initiation of pharyngeal swallow : Pyriform sinuses  Pharyngeal Impairment Domain: Pharyngeal Impairment Domain Soft palate elevation: No bolus between soft palate (SP)/pharyngeal wall (PW) Laryngeal elevation: Partial superior movement of thyroid cartilage/partial approximation of arytenoids to epiglottic petiole Anterior hyoid excursion: Partial anterior movement Epiglottic movement: Partial inversion Laryngeal vestibule closure: Incomplete, narrow column air/contrast in laryngeal vestibule Pharyngeal stripping wave : Present -  complete Pharyngeal contraction (A/P view only): N/A Pharyngoesophageal segment opening: Complete distension and complete duration, no obstruction of flow Tongue base retraction: Narrow column of contrast or air between tongue base and PPW Pharyngeal residue: Collection of residue within or on pharyngeal structures Location of pharyngeal residue: Diffuse (>3 areas)  Esophageal Impairment Domain: Esophageal Impairment Domain Esophageal clearance upright position: -- (Not tested.) Pill: No data recorded Penetration/Aspiration Scale Score: Penetration/Aspiration Scale Score 1.  Material does not enter airway: Puree; Moderately thick liquids (Level 3, honey thick) 3.  Material enters airway, remains ABOVE vocal cords and not ejected out: Mildly thick liquids (Level 2, nectar thick) 8.  Material enters airway, passes BELOW cords without attempt by patient to eject out (silent aspiration) : Thin liquids (Level 0) Compensatory Strategies: Compensatory Strategies Compensatory strategies: Yes Straw: Ineffective Multiple swallows: Effective (Required max cueing due to pt's language.) Effective Multiple Swallows: Puree; Moderately thick liquid (Level 3, honey thick); Mildly thick liquid (Level 2, nectar thick)   General Information: Caregiver present: No  Diet Prior to this Study: NPO   Temperature : Normal   Respiratory Status: WFL   Supplemental O2: None (Room air)   No data recorded Behavior/Cognition: Alert; Cooperative; Pleasant mood; Requires  cueing Self-Feeding Abilities: Needs assist with self-feeding Baseline vocal quality/speech: Dysphonic Volitional Cough: Unable to elicit Volitional Swallow: Able to elicit Exam Limitations: Poor positioning Goal Planning: Prognosis for improved oropharyngeal function: Good Barriers to Reach Goals: Language deficits No data recorded No data recorded Consulted and agree with results and recommendations: Pt unable/family or caregiver not available Pain: Pain Assessment Pain  Assessment: No/denies pain Faces Pain Scale: 0 Facial Expression: 0 Body Movements: 0 Muscle Tension: 0 Compliance with ventilator (intubated pts.): N/A Vocalization (extubated pts.): 0 CPOT Total: 0 End of Session: Start Time:SLP Start Time (ACUTE ONLY): 0903 Stop Time: SLP Stop Time (ACUTE ONLY): 0930 Time Calculation:SLP Time Calculation (min) (ACUTE ONLY): 27 min Charges: SLP Evaluations $ SLP Speech Visit: 1 Visit SLP Evaluations $BSS Swallow: 1 Procedure $MBS Swallow: 1 Procedure $ SLP EVAL LANGUAGE/SOUND PRODUCTION: 1 Procedure $Swallowing Treatment: 1 Procedure SLP visit diagnosis: SLP Visit Diagnosis: Dysphagia, oropharyngeal phase (R13.12) Past Medical History: Past Medical History: Diagnosis Date  Anemia of chronic disease   Ankle fracture 02/15/2016  Cancer (HCC)   Prostate  Chronic constipation   Chronic kidney disease   stage III  Diabetes mellitus without complication (HCC)   type II   Diabetic peripheral neuropathy (HCC)   Failure to thrive (0-17)   Fracture of left lower leg   Gout   Hyperlipidemia   Hypertension   Morbid obesity (HCC)   Unstable gait  Past Surgical History: Past Surgical History: Procedure Laterality Date  IR CT HEAD LTD  06/05/2023  IR INTRAVSC STENT CERV CAROTID W/O EMB-PROT MOD SED INC ANGIO  06/05/2023  IR PERCUTANEOUS ART THROMBECTOMY/INFUSION INTRACRANIAL INC DIAG ANGIO  06/05/2023  IR US GUIDE VASC ACCESS RIGHT  06/05/2023  ORIF ANKLE FRACTURE Left 02/29/2016  Procedure: OPEN REDUCTION INTERNAL FIXATION (ORIF) ANKLE FRACTURE;  Surgeon: Yolonda Kida, MD;  Location: [facility];  Service: Orthopedics;  Laterality: Left;  PROSTATE SURGERY    RADIOLOGY WITH ANESTHESIA N/A 06/05/2023  Procedure: IR WITH ANESTHESIA;  Surgeon: Radiologist, Medication, MD;  Location: [facility];  Service: Radiology;  Laterality: N/A; [place], Riley Nearing 06/07/2023, 12:15 PM  CT HEAD WO CONTRAST ( ) Result Date: 06/07/2023 CLINICAL DATA:  83 year old male status post code stroke presentation, left MCA M2  occlusion, with left MCA infarct with confluent petechial hemorrhage. EXAM: CT HEAD WITHOUT CONTRAST TECHNIQUE: Contiguous axial images were obtained from the base of the skull through the vertex without intravenous contrast. RADIATION DOSE REDUCTION: This exam was performed according to the departmental dose-optimization program which includes automated exposure control, adjustment of the mA and/or kV according to patient size and/or use of iterative reconstruction technique. COMPARISON:  Brain MRI yesterday.  Head CT 06/05/2023. FINDINGS: Brain: Confluent mixed density infarct in the left MCA middle division, epicenter at the insula and operculum. Confluent petechial hemorrhage on series 3, image 16 appears stable from the MRI. Superimposed trace subarachnoid blood is difficult to exclude by CT (series 3, image 13), but was not apparent on MRI. Stable mild mass effect on the left lateral ventricle with only trace rightward midline shift (series 3, image 16). No ventriculomegaly. Elsewhere gray-white differentiation is stable from the presentation CT. Basilar cisterns remain patent. Vascular: Resolved hyperdense left MCA since presentation. Calcified atherosclerosis at the skull base. Skull: Stable, intact. Sinuses/Orbits: Visualized paranasal sinuses and mastoids are stable and well aerated. Other: Left nasoenteric tube now in place. Stable orbit and scalp soft tissues. IMPRESSION: 1. Stable by CT confluent Left MCA middle division infarct with petechial hemorrhage (Heidelberg classification  1b: HI2, confluent petechiae, no mass effect). Questionable trace superimposed SAH, but was not apparent on MRI. 2. Stable minimal intracranial mass effect. 3. No new intracranial abnormality. Electronically Signed   By: Odessa Fleming M.D.   On: 06/07/2023 05:37   MR BRAIN WO CONTRAST Result Date: 06/06/2023 CLINICAL DATA:  Follow-up examination for stroke. EXAM: MRI HEAD WITHOUT CONTRAST TECHNIQUE: Multiplanar, multiecho pulse  sequences of the brain and surrounding structures were obtained without intravenous contrast. COMPARISON:  Comparison made to multiple previous exams from 06/05/2023. FINDINGS: Brain: Examination degraded by motion artifact. Cerebral volume within normal limits for age. Patchy T2/FLAIR hyperintensity involving the periventricular deep white matter both cerebral hemispheres, consistent with chronic small vessel ischemic disease, mild in nature. Small remote infarct noted within the right cerebellum. Confluent restricted diffusion involving the left insula and left frontal operculum, consistent with evolving acute left MCA distribution infarct. Area of infarction measures up to approximately 6 cm in AP diameter. Prominent associated susceptibility artifact, consistent with petechial hemorrhage (series 7, image 61) ( Heidelberg classification 1b: HI2, confluent petechiae, no mass effect. No frank or organized hematoma evident by MRI. No significant regional mass effect. Few additional small foci of infarction noted within the left parieto-occipital region posteriorly (series 3, images 40, 29). Apparent diffusion signal along the right frontal parafalcine region felt to be consistent with artifact related to dural calcification. Note made of a few additional chronic micro hemorrhages within the left cerebellum and right thalamus. No mass lesion or midline shift. Ventricles normal size without hydrocephalus. No extra-axial fluid collection. Pituitary gland suprasellar region within normal limits. Vascular: Major intracranial vascular flow voids are maintained. Skull and upper cervical spine: Craniocervical junction within normal limits. Bone marrow signal intensity overall within normal limits. No scalp soft tissue abnormality. Sinuses/Orbits: Prior bilateral ocular lens replacement. Paranasal sinuses are clear. No mastoid effusion. Other: None. IMPRESSION: 1. Evolving acute left MCA distribution infarct, most pronounced  at the left insula and left frontal operculum. Prominent associated petechial hemorrhage without frank intraparenchymal hematoma (Heidelberg classification 1b: HI2, confluent petechiae, no mass effect). 2. Few additional small foci of acute infarction within the left parieto-occipital region posteriorly. 3. Underlying mild chronic microvascular ischemic disease with small remote right cerebellar infarct. Electronically Signed   By: Rise Mu M.D.   On: 06/06/2023 03:12   ECHOCARDIOGRAM COMPLETE Result Date: 06/05/2023    ECHOCARDIOGRAM REPORT   Patient Name:   Fed Ceci. Date of Exam: 06/05/2023 Medical Rec #:  [id number]              Height:       70.0 in Accession #:    [id number]             Weight:       292.3 lb Date of Birth:  [birth year]              BSA:          2.453 m Patient Age:    82 years               BP:           129/72 mmHg Patient Gender: M                      HR:           89 bpm. Exam Location:  Inpatient Procedure: 2D Echo, Color Doppler, Cardiac Doppler and Intracardiac  Opacification Agent Indications:    Stroke I63.9  History:        Patient has prior history of Echocardiogram examinations, most                 recent 10/06/2019. Risk Factors:Diabetes, Hypertension and                 Dyslipidemia.  Sonographer:    Harriette Bouillon RDCS Referring Phys: Lynnae January IMPRESSIONS  1. Left ventricular ejection fraction, by estimation, is 30%. The left ventricle has moderately decreased function. The left ventricle demonstrates global hypokinesis. There is mild left ventricular hypertrophy.  2. Right ventricular systolic function is normal. The right ventricular size is normal.  3. Trivial mitral valve regurgitation.  4. The aortic valve is tricuspid. Aortic valve regurgitation is not visualized.  5. The inferior vena cava is normal in size with greater than 50% respiratory variability, suggesting right atrial pressure of 3 mmHg. Comparison(s): The left ventricular  function is worsened. FINDINGS  Left Ventricle: Left ventricular ejection fraction, by estimation, is 30%. The left ventricle has moderately decreased function. The left ventricle demonstrates global hypokinesis. Definity contrast agent was given IV to delineate the left ventricular endocardial borders. The left ventricular internal cavity size was normal in size. There is mild left ventricular hypertrophy. Right Ventricle: The right ventricular size is normal. Right vetricular wall thickness was not assessed. Right ventricular systolic function is normal. Left Atrium: Left atrial size was normal in size. Right Atrium: Right atrial size was normal in size. Pericardium: There is no evidence of pericardial effusion. Mitral Valve: There is mild thickening of the mitral valve leaflet(s). Mild to moderate mitral annular calcification. Trivial mitral valve regurgitation. Tricuspid Valve: The tricuspid valve is normal in structure. Tricuspid valve regurgitation is trivial. Aortic Valve: The aortic valve is tricuspid. Aortic valve regurgitation is not visualized. Pulmonic Valve: The pulmonic valve was normal in structure. Pulmonic valve regurgitation is not visualized. Aorta: The aortic root and ascending aorta are structurally normal, with no evidence of dilitation. Venous: The inferior vena cava is normal in size with greater than 50% respiratory variability, suggesting right atrial pressure of 3 mmHg. IAS/Shunts: The interatrial septum was not assessed.  LEFT VENTRICLE PLAX 2D LVIDd:         5.00 cm   Diastology LVIDs:         4.40 cm   LV e' lateral: 5.33 cm/s LV PW:         1.20 cm LV IVS:        1.10 cm LVOT diam:     2.30 cm LV SV:         55 LV SV Index:   23 LVOT Area:     4.15 cm  RIGHT VENTRICLE RV S prime:     14.40 cm/s LEFT ATRIUM         Index LA diam:    4.60 cm 1.88 cm/m  AORTIC VALVE LVOT Vmax:   65.50 cm/s LVOT Vmean:  45.200 cm/s LVOT VTI:    0.133 m  AORTA Ao Root diam: 3.00 cm Ao Asc diam:  3.50 cm   SHUNTS Systemic VTI:  0.13 m Systemic Diam: 2.30 cm Dietrich Pates MD Electronically signed by Dietrich Pates MD Signature Date/Time: 06/05/2023/9:33:38 PM    Final    DG Abd Portable 1V Result Date: 06/05/2023 CLINICAL DATA:  Feeding tube placement. EXAM: PORTABLE ABDOMEN - 1 VIEW COMPARISON:  None Available. FINDINGS: Distal tip of feeding tube is  seen in expected position of distal stomach. IMPRESSION: Distal tip of feeding tube seen in expected position of distal stomach. Electronically Signed   By: Lupita Raider M.D.   On: 06/05/2023 15:49   CT HEAD WO CONTRAST Result Date: 06/05/2023 CLINICAL DATA:  Stroke, follow-up. Status post intracranial mechanical thrombectomy and stenting of the left ICA bifurcation. EXAM: CT HEAD WITHOUT CONTRAST TECHNIQUE: Contiguous axial images were obtained from the base of the skull through the vertex without intravenous contrast. RADIATION DOSE REDUCTION: This exam was performed according to the departmental dose-optimization program which includes automated exposure control, adjustment of the mA and/or kV according to patient size and/or use of iterative reconstruction technique. COMPARISON:  CT head without contrast and CT angio head and neck 06/05/2023. By plain CT 06/05/2023. FINDINGS: Brain: The study is mildly degraded by patient motion. The left insular and left opercular infarct is somewhat obscured by patient motion. The cortex is slightly hyperdense, potentially reflecting reperfusion. No hemorrhage is present. Basal ganglia are intact. Subcortical white matter hypoattenuation in the high left frontal lobe is new. Mild right-sided white matter disease is stable. Basal ganglia are intact. A remote lacunar infarct is again noted in the right cerebellum. The brainstem and cerebellum are otherwise within normal limits. Vascular: Atherosclerotic calcifications are present within the cavernous internal carotid arteries and at the normal origin of both vertebral arteries. No  hyperdense vessel is present. Skull: Calvarium is intact. No focal lytic or blastic lesions are present. No significant extracranial soft tissue lesion is present. Sinuses/Orbits: The paranasal sinuses and mastoid air cells are clear. Bilateral lens replacements are noted. Globes and orbits are otherwise unremarkable. IMPRESSION: 1. The left insular and left opercular infarct is somewhat obscured by patient motion. 2. The cortex is slightly hyperdense, potentially reflecting reperfusion. 3. No hemorrhage. 4. Subcortical white matter hypoattenuation in the high left frontal lobe is new. This may reflect ischemic changes or edema related to the reperfusion. 5. Remote lacunar infarct of the right cerebellum. 6. Stable mild right-sided white matter disease. This likely reflects the sequela of chronic microvascular ischemia. Electronically Signed   By: Marin Roberts M.D.   On: 06/05/2023 14:38   IR PERCUTANEOUS ART THROMBECTOMY/INFUSION INTRACRANIAL INC DIAG ANGIO Result Date: 06/05/2023 INDICATION: 83 year old male presenting with right-sided weakness and aphasia; NIHSS 28. His last known well was 10 p.m. on 06/04/2023. His past medical history significant for prior stroke, hypertension, diabetes and chronic kidney disease; baseline modified Rankin scale 0. Head CT showed hypodensity within the left insula, basal ganglia and left frontal operculum (ASPECTS 7). No IV thrombolytic given as patient was outside the window. CT angiogram of the head and neck showed an occlusion of a left M2/MCA anterior division branch. CT perfusion showed a 41 mL core infarct with a 45 mL ischemic penumbra. She was transferred to our service for mechanical thrombectomy. EXAM: ULTRASOUND-GUIDED VASCULAR ACCESS DIAGNOSTIC CEREBRAL ANGIOGRAM MECHANICAL THROMBECTOMY FLAT PANEL HEAD CT LEFT CAROTID STENTING AND ANGIOPLASTY WITHOUT CEREBRAL PROTECTION DEVICE COMPARISON:  CT/CT angiogram of the head and neck June 05, 2023. MEDICATIONS:  No antibiotics administered. ANESTHESIA/SEDATION: The procedure was performed under general anesthesia. CONTRAST:  80 mL of Omnipaque 300 milligram/mL FLUOROSCOPY: Radiation Exposure Index (as provided by the fluoroscopic device): 1315 mGy Kerma COMPLICATIONS: None immediate. TECHNIQUE: Informed written consent was obtained from the patient's wife after a thorough discussion of the procedural risks, benefits and alternatives. All questions were addressed. Maximal Sterile Barrier Technique was utilized including caps, mask, sterile gowns, sterile gloves,  sterile drape, hand hygiene and skin antiseptic. A timeout was performed prior to the initiation of the procedure. The right groin was prepped and draped in the usual sterile fashion. Using a micropuncture kit and the modified Seldinger technique, access was gained to the right common femoral artery and an 8 French sheath was placed. Real-time ultrasound guidance was utilized for vascular access including the acquisition of a permanent ultrasound image documenting patency of the accessed vessel. Under fluoroscopy, an 8 [place] Walrus balloon guide catheter was navigated over a 6 [place] VTK catheter and a 0.035" Terumo Glidewire into the aortic arch. The catheter was placed into the left common carotid artery and then advanced into the left internal carotid artery. The diagnostic catheter was removed. Frontal and lateral angiograms of the head were obtained. FINDINGS: 1. Ultrasound showed heavily calcified right common femoral artery is maintained patency and caliber. 2. Proximal occlusion of a left M2/MCA anterior division branch. 3. Atherosclerotic changes of the intracranial left ICA with mild stenosis at the distal cavernous segment. 4. 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 PROCEDURE: Using biplane roadmap guidance, a Red 62 aspiration catheter was  navigated over Colossus 35 microguidewire into the cavernous segment of the left ICA. The aspiration catheter was then advanced to the level of occlusion and connected to an aspiration pump. Continuous aspiration was performed for 2 minutes. The guide catheter was connected to a VacLok syringe and the guiding catheter balloon was inflated. The aspiration catheter was subsequently removed under constant aspiration. The guide catheter was aspirated for debris. Left internal carotid artery angiograms with frontal and lateral views of the head showed complete recanalization of the left MCA vascular tree. The guide catheter was retracted into the neck. Frontal and lateral angiograms of the neck were obtained. Improvement of the degree of stenosis in the carotid bulb compared to prior CT angiogram. There is prominent luminal irregularity at the carotid bulb residual moderate stenosis. Increased tortuosity of the proximal/mid cervical left ICA with kinking. Left internal carotid artery angiograms with left anterior oblique views of the neck showed evidence of filling defects at the level of the carotid bulb. Flat panel CT of the head was obtained and post processed in a separate workstation with concurrent attending physician supervision. Selected images were sent to PACS. No evidence of hemorrhagic complication. There is mild contrast staining of the left insular and frontal cortex. Repeat left internal carotid artery angiograms with frontal and lateral views of the head showed Amy seen left M3/MCA branch to the left parietal region. Left common carotid artery angiograms with frontal and lateral views of the neck showed vertebra Gretchen of stenosis at the left ICA bulb with more prominent filling defect. At this point, patient was loaded on cangrelor followed by continuous drip. Using biplane roadmap guidance, a 4-7 mm Emboshield NAV6 cerebral protection device was advanced into the cervical left ICA. However, multiple  attempts to advance a cerebral protection device through the left ICA kinking proved unsuccessful. The cerebral protection device was subsequently removed. Using biplane roadmap guidance, a 10-8 x 40 mm XACT carotid stent was navigated and deployed from the distal left common carotid artery to the proximal left internal carotid artery, proximal to the vessel kinking. Suboptimal stent expansion was noted. Then, a 6 x 30 mm Viatrac balloon was navigated into the recently deployed stent. Angioplasty was performed under fluoroscopy. Left internal carotid artery angiograms with  frontal and lateral views of the neck showed adequate stent positioning and expansion with brisk anterograde flow. Left internal carotid artery angiograms with frontal and lateral views of the head showed improvement of anterograde flow in the left MCA vascular tree with brisk anterograde flow. Delayed left common carotid artery angiograms with frontal and lateral views of the neck showed no evidence of clot formation within the stent. The catheter was subsequently Riddle. Right common femoral artery angiogram was obtained in right anterior oblique view. The puncture is at the level of the common femoral artery. The artery has normal caliber, adequate for closure device. The sheath was exchanged over the wire for an 8 [place] Angio-Seal which was utilized for access closure. Immediate hemostasis was achieved. IMPRESSION: 1. Successful mechanical thrombectomy for treatment of a proximal left M2/MCA anterior division branch occlusion achieving complete recanalization (TICI 3). 2. 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 PLAN: Continue cangrelor infusion until patient is transitioned to oral dual antiplatelet therapy. Electronically Signed   By: Baldemar Lenis M.D.   On: 06/05/2023 13:16    IR US Guide Vasc Access Right Result Date: 06/05/2023 INDICATION: 83 year old male presenting with right-sided weakness and aphasia; NIHSS 28. His last known well was 10 p.m. on 06/04/2023. His past medical history significant for prior stroke, hypertension, diabetes and chronic kidney disease; baseline modified Rankin scale 0. Head CT showed hypodensity within the left insula, basal ganglia and left frontal operculum (ASPECTS 7). No IV thrombolytic given as patient was outside the window. CT angiogram of the head and neck showed an occlusion of a left M2/MCA anterior division branch. CT perfusion showed a 41 mL core infarct with a 45 mL ischemic penumbra. She was transferred to our service for mechanical thrombectomy. EXAM: ULTRASOUND-GUIDED VASCULAR ACCESS DIAGNOSTIC CEREBRAL ANGIOGRAM MECHANICAL THROMBECTOMY FLAT PANEL HEAD CT LEFT CAROTID STENTING AND ANGIOPLASTY WITHOUT CEREBRAL PROTECTION DEVICE COMPARISON:  CT/CT angiogram of the head and neck June 05, 2023. MEDICATIONS: No antibiotics administered. ANESTHESIA/SEDATION: The procedure was performed under general anesthesia. CONTRAST:  80 mL of Omnipaque 300 milligram/mL FLUOROSCOPY: Radiation Exposure Index (as provided by the fluoroscopic device): 1315 mGy Kerma COMPLICATIONS: None immediate. TECHNIQUE: Informed written consent was obtained from the patient's wife after a thorough discussion of the procedural risks, benefits and alternatives. All questions were addressed. Maximal Sterile Barrier Technique was utilized including caps, mask, sterile gowns, sterile gloves, sterile drape, hand hygiene and skin antiseptic. A timeout was performed prior to the initiation of the procedure. The right groin was prepped and draped in the usual sterile fashion. Using a micropuncture kit and the modified Seldinger technique, access was gained to the right common femoral artery and an 8 French sheath was placed. Real-time ultrasound guidance was utilized for  vascular access including the acquisition of a permanent ultrasound image documenting patency of the accessed vessel. Under fluoroscopy, an 8 [place] Walrus balloon guide catheter was navigated over a 6 [place] VTK catheter and a 0.035" Terumo Glidewire into the aortic arch. The catheter was placed into the left common carotid artery and then advanced into the left internal carotid artery. The diagnostic catheter was removed. Frontal and lateral angiograms of the head were obtained. FINDINGS: 1. Ultrasound showed heavily calcified right common femoral artery is maintained patency and caliber. 2. Proximal occlusion of a left M2/MCA anterior division branch. 3. Atherosclerotic changes of the intracranial left ICA with mild  stenosis at the distal cavernous segment. 4. 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 PROCEDURE: Using biplane roadmap guidance, a Red 62 aspiration catheter was navigated over Colossus 35 microguidewire into the cavernous segment of the left ICA. The aspiration catheter was then advanced to the level of occlusion and connected to an aspiration pump. Continuous aspiration was performed for 2 minutes. The guide catheter was connected to a VacLok syringe and the guiding catheter balloon was inflated. The aspiration catheter was subsequently removed under constant aspiration. The guide catheter was aspirated for debris. Left internal carotid artery angiograms with frontal and lateral views of the head showed complete recanalization of the left MCA vascular tree. The guide catheter was retracted into the neck. Frontal and lateral angiograms of the neck were obtained. Improvement of the degree of stenosis in the carotid bulb compared to prior CT angiogram. There is prominent luminal irregularity at the carotid bulb residual moderate stenosis. Increased tortuosity of the proximal/mid cervical left ICA  with kinking. Left internal carotid artery angiograms with left anterior oblique views of the neck showed evidence of filling defects at the level of the carotid bulb. Flat panel CT of the head was obtained and post processed in a separate workstation with concurrent attending physician supervision. Selected images were sent to PACS. No evidence of hemorrhagic complication. There is mild contrast staining of the left insular and frontal cortex. Repeat left internal carotid artery angiograms with frontal and lateral views of the head showed Amy seen left M3/MCA branch to the left parietal region. Left common carotid artery angiograms with frontal and lateral views of the neck showed vertebra Gretchen of stenosis at the left ICA bulb with more prominent filling defect. At this point, patient was loaded on cangrelor followed by continuous drip. Using biplane roadmap guidance, a 4-7 mm Emboshield NAV6 cerebral protection device was advanced into the cervical left ICA. However, multiple attempts to advance a cerebral protection device through the left ICA kinking proved unsuccessful. The cerebral protection device was subsequently removed. Using biplane roadmap guidance, a 10-8 x 40 mm XACT carotid stent was navigated and deployed from the distal left common carotid artery to the proximal left internal carotid artery, proximal to the vessel kinking. Suboptimal stent expansion was noted. Then, a 6 x 30 mm Viatrac balloon was navigated into the recently deployed stent. Angioplasty was performed under fluoroscopy. Left internal carotid artery angiograms with frontal and lateral views of the neck showed adequate stent positioning and expansion with brisk anterograde flow. Left internal carotid artery angiograms with frontal and lateral views of the head showed improvement of anterograde flow in the left MCA vascular tree with brisk anterograde flow. Delayed left common carotid artery angiograms with frontal and lateral views  of the neck showed no evidence of clot formation within the stent. The catheter was subsequently Riddle. Right common femoral artery angiogram was obtained in right anterior oblique view. The puncture is at the level of the common femoral artery. The artery has normal caliber, adequate for closure device. The sheath was exchanged over the wire for an 8 [place] Angio-Seal which was utilized for access closure. Immediate hemostasis was achieved. IMPRESSION: 1. Successful mechanical thrombectomy for treatment of a proximal left M2/MCA anterior division branch occlusion achieving complete recanalization (TICI 3). 2. Atherosclerotic disease of the left carotid bifurcation with stenosis and clot formation suggesting acute plaque rupture treated with stenting and angioplasty with  resolution of stenosis. PLAN: Continue cangrelor infusion until patient is transitioned to oral dual antiplatelet therapy. Electronically Signed   By: Baldemar Lenis M.D.   On: 06/05/2023 13:16   IR INTRAVSC STENT CERV CAROTID W/O EMB-PROT MOD SED Result Date: 06/05/2023 INDICATION: 83 year old male presenting with right-sided weakness and aphasia; NIHSS 28. His last known well was 10 p.m. on 06/04/2023. His past medical history significant for prior stroke, hypertension, diabetes and chronic kidney disease; baseline modified Rankin scale 0. Head CT showed hypodensity within the left insula, basal ganglia and left frontal operculum (ASPECTS 7). No IV thrombolytic given as patient was outside the window. CT angiogram of the head and neck showed an occlusion of a left M2/MCA anterior division branch. CT perfusion showed a 41 mL core infarct with a 45 mL ischemic penumbra. She was transferred to our service for mechanical thrombectomy. EXAM: ULTRASOUND-GUIDED VASCULAR ACCESS DIAGNOSTIC CEREBRAL ANGIOGRAM MECHANICAL THROMBECTOMY FLAT PANEL HEAD CT LEFT CAROTID STENTING AND ANGIOPLASTY WITHOUT CEREBRAL PROTECTION DEVICE COMPARISON:   CT/CT angiogram of the head and neck June 05, 2023. MEDICATIONS: No antibiotics administered. ANESTHESIA/SEDATION: The procedure was performed under general anesthesia. CONTRAST:  80 mL of Omnipaque 300 milligram/mL FLUOROSCOPY: Radiation Exposure Index (as provided by the fluoroscopic device): 1315 mGy Kerma COMPLICATIONS: None immediate. TECHNIQUE: Informed written consent was obtained from the patient's wife after a thorough discussion of the procedural risks, benefits and alternatives. All questions were addressed. Maximal Sterile Barrier Technique was utilized including caps, mask, sterile gowns, sterile gloves, sterile drape, hand hygiene and skin antiseptic. A timeout was performed prior to the initiation of the procedure. The right groin was prepped and draped in the usual sterile fashion. Using a micropuncture kit and the modified Seldinger technique, access was gained to the right common femoral artery and an 8 French sheath was placed. Real-time ultrasound guidance was utilized for vascular access including the acquisition of a permanent ultrasound image documenting patency of the accessed vessel. Under fluoroscopy, an 8 [place] Walrus balloon guide catheter was navigated over a 6 [place] VTK catheter and a 0.035" Terumo Glidewire into the aortic arch. The catheter was placed into the left common carotid artery and then advanced into the left internal carotid artery. The diagnostic catheter was removed. Frontal and lateral angiograms of the head were obtained. FINDINGS: 1. Ultrasound showed heavily calcified right common femoral artery is maintained patency and caliber. 2. Proximal occlusion of a left M2/MCA anterior division branch. 3. Atherosclerotic changes of the intracranial left ICA with mild stenosis at the distal cavernous segment. 4. 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 PROCEDURE:  Using biplane roadmap guidance, a Red 62 aspiration catheter was navigated over Colossus 35 microguidewire into the cavernous segment of the left ICA. The aspiration catheter was then advanced to the level of occlusion and connected to an aspiration pump. Continuous aspiration was performed for 2 minutes. The guide catheter was connected to a VacLok syringe and the guiding catheter balloon was inflated. The aspiration catheter was subsequently removed under constant aspiration. The guide catheter was aspirated for debris. Left internal carotid artery angiograms with frontal and lateral views of the head showed complete recanalization of the left MCA vascular tree. The guide catheter was retracted into the neck. Frontal and lateral angiograms of the neck were obtained. Improvement of the degree of stenosis in the carotid bulb compared to prior CT angiogram. There is prominent luminal  irregularity at the carotid bulb residual moderate stenosis. Increased tortuosity of the proximal/mid cervical left ICA with kinking. Left internal carotid artery angiograms with left anterior oblique views of the neck showed evidence of filling defects at the level of the carotid bulb. Flat panel CT of the head was obtained and post processed in a separate workstation with concurrent attending physician supervision. Selected images were sent to PACS. No evidence of hemorrhagic complication. There is mild contrast staining of the left insular and frontal cortex. Repeat left internal carotid artery angiograms with frontal and lateral views of the head showed Amy seen left M3/MCA branch to the left parietal region. Left common carotid artery angiograms with frontal and lateral views of the neck showed vertebra Gretchen of stenosis at the left ICA bulb with more prominent filling defect. At this point, patient was loaded on cangrelor followed by continuous drip. Using biplane roadmap guidance, a 4-7 mm Emboshield NAV6 cerebral protection  device was advanced into the cervical left ICA. However, multiple attempts to advance a cerebral protection device through the left ICA kinking proved unsuccessful. The cerebral protection device was subsequently removed. Using biplane roadmap guidance, a 10-8 x 40 mm XACT carotid stent was navigated and deployed from the distal left common carotid artery to the proximal left internal carotid artery, proximal to the vessel kinking. Suboptimal stent expansion was noted. Then, a 6 x 30 mm Viatrac balloon was navigated into the recently deployed stent. Angioplasty was performed under fluoroscopy. Left internal carotid artery angiograms with frontal and lateral views of the neck showed adequate stent positioning and expansion with brisk anterograde flow. Left internal carotid artery angiograms with frontal and lateral views of the head showed improvement of anterograde flow in the left MCA vascular tree with brisk anterograde flow. Delayed left common carotid artery angiograms with frontal and lateral views of the neck showed no evidence of clot formation within the stent. The catheter was subsequently Riddle. Right common femoral artery angiogram was obtained in right anterior oblique view. The puncture is at the level of the common femoral artery. The artery has normal caliber, adequate for closure device. The sheath was exchanged over the wire for an 8 [place] Angio-Seal which was utilized for access closure. Immediate hemostasis was achieved. IMPRESSION: 1. Successful mechanical thrombectomy for treatment of a proximal left M2/MCA anterior division branch occlusion achieving complete recanalization (TICI 3). 2. 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 PLAN: Continue cangrelor infusion until patient is transitioned to oral dual antiplatelet therapy. Electronically Signed    By: Baldemar Lenis M.D.   On: 06/05/2023 13:16   IR CT Head Ltd Result Date: 06/05/2023 INDICATION: 83 year old male presenting with right-sided weakness and aphasia; NIHSS 28. His last known well was 10 p.m. on 06/04/2023. His past medical history significant for prior stroke, hypertension, diabetes and chronic kidney disease; baseline modified Rankin scale 0. Head CT showed hypodensity within the left insula, basal ganglia and left frontal operculum (ASPECTS 7). No IV thrombolytic given as patient was outside the window. CT angiogram of the head and neck showed an occlusion of a left M2/MCA anterior division branch. CT perfusion showed a 41 mL core infarct with a 45 mL ischemic penumbra. She was transferred to our service for mechanical thrombectomy. EXAM: ULTRASOUND-GUIDED VASCULAR ACCESS DIAGNOSTIC CEREBRAL ANGIOGRAM MECHANICAL THROMBECTOMY FLAT PANEL HEAD CT LEFT CAROTID STENTING AND ANGIOPLASTY WITHOUT CEREBRAL PROTECTION DEVICE  COMPARISON:  CT/CT angiogram of the head and neck June 05, 2023. MEDICATIONS: No antibiotics administered. ANESTHESIA/SEDATION: The procedure was performed under general anesthesia. CONTRAST:  80 mL of Omnipaque 300 milligram/mL FLUOROSCOPY: Radiation Exposure Index (as provided by the fluoroscopic device): 1315 mGy Kerma COMPLICATIONS: None immediate. TECHNIQUE: Informed written consent was obtained from the patient's wife after a thorough discussion of the procedural risks, benefits and alternatives. All questions were addressed. Maximal Sterile Barrier Technique was utilized including caps, mask, sterile gowns, sterile gloves, sterile drape, hand hygiene and skin antiseptic. A timeout was performed prior to the initiation of the procedure. The right groin was prepped and draped in the usual sterile fashion. Using a micropuncture kit and the modified Seldinger technique, access was gained to the right common femoral artery and an 8 French sheath was placed.  Real-time ultrasound guidance was utilized for vascular access including the acquisition of a permanent ultrasound image documenting patency of the accessed vessel. Under fluoroscopy, an 8 [place] Walrus balloon guide catheter was navigated over a 6 [place] VTK catheter and a 0.035" Terumo Glidewire into the aortic arch. The catheter was placed into the left common carotid artery and then advanced into the left internal carotid artery. The diagnostic catheter was removed. Frontal and lateral angiograms of the head were obtained. FINDINGS: 1. Ultrasound showed heavily calcified right common femoral artery is maintained patency and caliber. 2. Proximal occlusion of a left M2/MCA anterior division branch. 3. Atherosclerotic changes of the intracranial left ICA with mild stenosis at the distal cavernous segment. 4. 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 PROCEDURE: Using biplane roadmap guidance, a Red 62 aspiration catheter was navigated over Colossus 35 microguidewire into the cavernous segment of the left ICA. The aspiration catheter was then advanced to the level of occlusion and connected to an aspiration pump. Continuous aspiration was performed for 2 minutes. The guide catheter was connected to a VacLok syringe and the guiding catheter balloon was inflated. The aspiration catheter was subsequently removed under constant aspiration. The guide catheter was aspirated for debris. Left internal carotid artery angiograms with frontal and lateral views of the head showed complete recanalization of the left MCA vascular tree. The guide catheter was retracted into the neck. Frontal and lateral angiograms of the neck were obtained. Improvement of the degree of stenosis in the carotid bulb compared to prior CT angiogram. There is prominent luminal irregularity at the carotid bulb residual moderate stenosis. Increased  tortuosity of the proximal/mid cervical left ICA with kinking. Left internal carotid artery angiograms with left anterior oblique views of the neck showed evidence of filling defects at the level of the carotid bulb. Flat panel CT of the head was obtained and post processed in a separate workstation with concurrent attending physician supervision. Selected images were sent to PACS. No evidence of hemorrhagic complication. There is mild contrast staining of the left insular and frontal cortex. Repeat left internal carotid artery angiograms with frontal and lateral views of the head showed Amy seen left M3/MCA branch to the left parietal region. Left common carotid artery angiograms with frontal and lateral views of the neck showed vertebra Gretchen of stenosis at the left ICA bulb with more prominent filling defect. At this point, patient was loaded on cangrelor followed by continuous drip. Using biplane roadmap guidance, a 4-7 mm Emboshield NAV6 cerebral protection device was advanced into the cervical left ICA. However, multiple attempts  to advance a cerebral protection device through the left ICA kinking proved unsuccessful. The cerebral protection device was subsequently removed. Using biplane roadmap guidance, a 10-8 x 40 mm XACT carotid stent was navigated and deployed from the distal left common carotid artery to the proximal left internal carotid artery, proximal to the vessel kinking. Suboptimal stent expansion was noted. Then, a 6 x 30 mm Viatrac balloon was navigated into the recently deployed stent. Angioplasty was performed under fluoroscopy. Left internal carotid artery angiograms with frontal and lateral views of the neck showed adequate stent positioning and expansion with brisk anterograde flow. Left internal carotid artery angiograms with frontal and lateral views of the head showed improvement of anterograde flow in the left MCA vascular tree with brisk anterograde flow. Delayed left common carotid  artery angiograms with frontal and lateral views of the neck showed no evidence of clot formation within the stent. The catheter was subsequently Riddle. Right common femoral artery angiogram was obtained in right anterior oblique view. The puncture is at the level of the common femoral artery. The artery has normal caliber, adequate for closure device. The sheath was exchanged over the wire for an 8 [place] Angio-Seal which was utilized for access closure. Immediate hemostasis was achieved. IMPRESSION: 1. Successful mechanical thrombectomy for treatment of a proximal left M2/MCA anterior division branch occlusion achieving complete recanalization (TICI 3). 2. 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 PLAN: Continue cangrelor infusion until patient is transitioned to oral dual antiplatelet therapy. Electronically Signed   By: Baldemar Lenis M.D.   On: 06/05/2023 13:16   CT ANGIO HEAD NECK W WO CM W PERF (CODE STROKE) Addendum Date: 06/05/2023 ADDENDUM REPORT: 06/05/2023 12:25 ADDENDUM: Please note, there is a dictation error within CTA neck impression #1, which should read: The common carotid and internal carotid arteries are patent within the neck. Atherosclerotic plaque bilaterally. Most notably, there is progressive atherosclerotic plaque about the left carotid bifurcation and within the proximal left ICA with resultant severe near occlusive stenosis of the proximal left ICA. Also of note, atherosclerotic plaque about the right carotid bifurcation results in a 40% stenosis at the right ICA origin. Electronically Signed   By: Jackey Loge D.O.   On: 06/05/2023 12:25   Result Date: 06/05/2023 CLINICAL DATA:  Provided history: Cerebrovascular accident, unspecified mechanism. Right-sided weakness. Right-sided facial droop. Altered mental status. EXAM: CT  ANGIOGRAPHY HEAD AND NECK CT PERFUSION BRAIN TECHNIQUE: Multidetector CT imaging of the head and neck was performed using the standard protocol during bolus administration of intravenous contrast. Multiplanar CT image reconstructions and MIPs were obtained to evaluate the vascular anatomy. Carotid stenosis measurements (when applicable) are obtained utilizing NASCET criteria, using the distal internal carotid diameter as the denominator. Multiphase CT imaging of the brain was performed following IV bolus contrast injection. Subsequent parametric perfusion maps were calculated using RAPID software. RADIATION DOSE REDUCTION: This exam was performed according to the departmental dose-optimization program which includes automated exposure control, adjustment of the mA and/or kV according to patient size and/or use of iterative reconstruction technique. CONTRAST:  OMNIPAQUE IOHEXOL 350 MG/ML SOLN COMPARISON:  Noncontrast head CT performed earlier today 06/05/2023. MRA head and MRA neck 10/04/2019. FINDINGS: CTA NECK FINDINGS Aortic arch: Common origin of the innominate and left common carotid arteries. Atherosclerotic plaque within the visualized thoracic aorta and proximal major branch vessels of the neck. Streak/beam  hardening artifact arising from a dense right-sided contrast bolus partially obscures the right subclavian artery. Within this limitation, there is no appreciable hemodynamically significant innominate or proximal subclavian artery stenosis. Right carotid system: CCA and ICA patent within the neck. Atherosclerotic plaque, greatest about the carotid bifurcation. Resultant 40% stenosis at the ICA origin. Left carotid system: CCA and ICA patent within the neck. Atherosclerotic plaque. Most notably, there is prominent atherosclerotic plaque about the carotid bifurcation and within the proximal ICA which has progressed from the prior MRA neck of 10/04/2019. Resultant severe (near occlusive) stenosis of  the proximal ICA. Tortuosity of the cervical ICA Vertebral arteries: The vertebral arteries are patent within the neck. Streak/beam hardening artifact limits evaluation of the right vertebral artery origin. At least moderate stenosis is suspected at this site. Atherosclerotic plaque scattered elsewhere within the cervical right vertebral artery with no more than mild stenosis. Calcified atherosclerotic plaque at the left vertebral artery origin with suspected at least moderate stenosis. Nonstenotic calcified plaque elsewhere within the cervical left vertebral artery. Skeleton: Cervical spondylosis. Other neck: No neck mass or cervical lymphadenopathy. Upper chest: No consolidation within the imaged lung apices. Review of the MIP images confirms the above findings CTA HEAD FINDINGS Anterior circulation: The intracranial internal carotid arteries are patent. As sclerotic plaque within both vessels. No more than mild stenosis on the right. Up to moderate stenosis within the left cavernous segment. The M1 middle cerebral arteries are patent. Abrupt occlusion of a proximal M2 left middle cerebral artery vessel (series 11, image 22). Atherosclerotic irregularity of the M2 and more distal MCA vessels elsewhere. The anterior cerebral arteries are patent. Atherosclerotic irregularity of both vessels without high-grade proximal stenosis. A possible 2 mm periophthalmic left ICA aneurysm with better appreciated on the prior MRA head of 10/04/2019. Posterior circulation: The intracranial vertebral arteries are patent. Atherosclerotic plaque within the right V4 segment sites of mild stenosis. Non-stenotic atherosclerotic plaque within the left V4 segment. The basilar artery is patent. The posterior cerebral arteries are patent. Posterior communicating arteries are diminutive or absent, bilaterally. Venous sinuses: Assessment for dural venous sinus thrombosis is limited due to contrast timing. Anatomic variants: As described.  Review of the MIP images confirms the above findings CT Brain Perfusion Findings: ASPECTS: CBF (<30%) Volume: 41mL Perfusion (Tmax>6.0s) volume: 86mL Mismatch Volume: 45mL Infarction Location:Left MCA vascular territory CTA head impression #1, the CT perfusion head impression and the presence of a severe stenosis of the proximal cervical left ICA called by telephone at the time of interpretation on 06/05/2023 at 8:40 am to provider ERIC [facility] , who verbally acknowledged these results. IMPRESSION: CTA neck: 1. No common carotid and internal carotid arteries are patent within the neck. Atherosclerotic plaque bilaterally. Most notably, there is progressive atherosclerotic plaque about the left carotid bifurcation and within the proximal left ICA with resultant severe, near occlusive stenosis of the proximal left ICA. Also of note, atherosclerotic plaque about the right carotid bifurcation results in 40% stenosis at the right ICA origin. 2. The vertebral arteries are patent within the neck. Atherosclerotic plaque bilaterally as described. Most notably, there is suspected at least moderate stenoses at the bilateral vertebral artery origins. 3. Aortic Atherosclerosis (ICD10-I70.0). CTA head: 1. Abrupt occlusion of a proximal M2 left middle cerebral artery vessel. 2. Background intracranial atherosclerotic disease as described. 3. A possible 2 mm periophthalmic left ICA aneurysm was better appreciated on the prior MRA head of 10/04/2019. CT perfusion head: The perfusion software identifies a 41 mL core infarct  in the left MCA vascular territory. The perfusion software identifies an 86 mL region of critically hypoperfused parenchyma within the left MCA vascular territory (utilizing the Tmax>6 seconds threshold). Reported mismatch volume: 45 mL Electronically Signed: By: Jackey Loge D.O. On: 06/05/2023 09:07   CT HEAD CODE STROKE WO CONTRAST Result Date: 06/05/2023 CLINICAL DATA:  Code stroke. Neuro deficit, acute, stroke  suspected. EXAM: CT HEAD WITHOUT CONTRAST TECHNIQUE: Contiguous axial images were obtained from the base of the skull through the vertex without intravenous contrast. RADIATION DOSE REDUCTION: This exam was performed according to the departmental dose-optimization program which includes automated exposure control, adjustment of the mA and/or kV according to patient size and/or use of iterative reconstruction technique. COMPARISON:  Brain MRI 10/04/2019.  Noncontrast head CT 10/03/2019. FINDINGS: Brain: Generalized cerebral atrophy. Loss of gray-white differentiation consistent with an acute infarct within the left insula and within portions of the left frontal operculum (MCA vascular territory). Known small chronic cortically-based infarcts within the left frontal, left parietal and left occipital lobes were better appreciated on the prior brain MRI of 10/04/2019 (acute at that time). Mild patchy and ill-defined hypoattenuation within the cerebral white matter, nonspecific but compatible with chronic small vessel ischemic disease. Subcentimeter infarct within the superior right cerebellar hemisphere, new from the prior MRI but chronic in appearance. Loss of gray-white differentiation there is no acute intracranial hemorrhage. No extra-axial fluid collection. No evidence of an intracranial mass. No midline shift. Vascular: No hyperdense vessel.  Atherosclerotic calcifications. Skull: No calvarial fracture or aggressive osseous lesion. Sinuses/Orbits: No mass or acute finding within the imaged orbits. No significant paranasal sinus disease. ASPECTS [facility] Stroke Program Early CT Score) - Ganglionic level infarction (caudate, lentiform nuclei, internal capsule, insula, M1-M3 cortex): 5 - Supraganglionic infarction (M4-M6 cortex): 2 Total score (0-10 with 10 being normal): 7 Impression #1 called by telephone at the time of interpretation on 06/05/2023 at 8:40 am to provider Dr. Otelia Limes, who verbally acknowledged these  results. IMPRESSION: 1. Acute left MCA territory infarct affecting the left insula and portions of the left frontal operculum. ASPECTS is 7. 2. Known small chronic cortically-based infarcts within the left frontal, left parietal and left occipital lobes were better appreciated on the prior brain MRI of 10/04/2019 (acute at that time). 3. Background mild cerebral white matter chronic small vessel ischemic disease. 4. Subcentimeter infarct within the right cerebellar hemisphere, new from prior MRI but chronic in appearance. 5. Generalized cerebral atrophy. Electronically Signed   By: Jackey Loge D.O.   On: 06/05/2023 08:45     PHYSICAL EXAM  Temp:  [97.5 F (36.4 C)-98.9 F (37.2 C)] 98.4 F (36.9 C) (02/12 1105) Pulse Rate:  [85-110] 110 (02/12 1105) Resp:  [17-18] 17 (02/12 0434) BP: (113-150)/(61-83) 121/81 (02/12 1105) SpO2:  [95 %-100 %] 95 % (02/12 1105) Weight:  [123.4 kg] 123.4 kg (02/12 0500)  General -well-nourished, well-developed elderly patient in no acute distress Sitting up in chair  Cardiovascular - Regular rhythm and rate.  Neuro - awake, alert, eyes open, orientated to age, place only.    Severe dysarthria, but following following commands.   No gaze palsy, tracking bilaterally, blinking to visual threat bilaterally.  Right facial droop. Tongue midline Bilateral UEs 4/5, minimal right drift.  Bilaterally LEs 4/5.  Sensation symmetric to light touch.  gait not tested.    ASSESSMENT/PLAN [name]. is a 83 y.o. male with history of hypertension, diabetes, CKD 3, stroke admitted for right-sided weakness numbness, aphasia,  right facial droop, left gaze preference. No TNK given due to outside window.  Patient underwent mechanical thrombectomy with left ICA stenting.  He is now awaiting placement at a SNF for rehab.  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 status post IR with TICI3 and confluent HT, likely secondary to  large vessel disease source versus cardiomyopathy with low EF CT left MCA infarct CT head and neck left M2 occlusion, left ICA near occlusion, right ICA 43 stenosis, bilateral VA origin severe stenosis CTP 41/86 Status post IR with TICI3 and left ICA stenting MRI left MCA infarct at left insular and left frontal operculum, prominent and confluent petechial hemorrhagic transformation CT repeat 2/7 stable confluent petechial hemorrhage 2D Echo EF 30% LDL 107 HgbA1c 6.1 UDS negative Heparin subcu for VTE prophylaxis aspirin 81 mg daily and clopidogrel 75 mg daily prior to admission, now on aspirin and Plavix post stenting. Patient will be counseled to be compliant with his antithrombotic medications Ongoing aggressive stroke risk factor management Therapy recommendations:  SNF Disposition: Pending  History of stroke 10/08/2019 admitted for left MCA infarct due to right upper extremity weakness.  MRA head and neck showed left ICA 30% stenosis.  EF 45 to 50%.  LDL 105, A1c 5.6.  Recommended loop recorder at that time but only got 30-day CardioNet monitoring which was no A-fib.  Discharged on DAPT and Lipitor 80.  Cardiomyopathy CHF 09/2019 EF 45 to 50% Current admission EF 30% Cardiology on board, appreciate Dr. Rennis Golden assistance On DAPT Agree with GDMT - would like to start losartan, entresto, and spironolactone as BP and Cr tolerates Metoprolol succinate 25mg  daily Lasix 80mg  x1 May consider loop recorder placement to rule out A-fib  Diabetes HgbA1c 6.1 goal < 7.0 Controlled CBG monitoring SSI.  Add Semglee 3 units daily and continue to monitor DM education and close PCP follow up  Hypertension Stable BP goal less than 160 given hemorrhagic transformation Long term BP goal normotensive  Hyperlipidemia Home meds: Lipitor 80 LDL 105, goal < 70 Now on lipitor 80 and Zetia Continue statin and at discharge  Dysphagia Poststroke dysphagia Speech on board Dysphagia 1 (Pureed)  honey thick liquid. On core track-will DC core track as he is eating 100% of meals.  DC tube feeds.  Other Stroke Risk Factors Advanced age Obesity, Body mass index is 39.03 kg/m.   Other Active Problems AKI on CKD3b, creatinine 1.60--1.66 -> 1.98>1.87-> 1.81 Hemoglobin 9.3 >9.7-> 10.0  Hospital day # 7  Patient medically stable to be transferred to rehabilitation and skilled nursing facility when bed available.  D/w case manager Delia Heady ,   [facility] See Amion for schedule and pager information 06/12/2023 1:19 PM

## 2023-06-12 NOTE — TOC Progression Note (Signed)
Transition of Care (TOC) - Progression Note    Patient Details  Name: Nathan Proctor. MRN: [id number] Date of Birth: Oct 28, 1940  Transition of Care [facility] CM/SW Contact  Baldemar Lenis, [place] Phone Number: 06/12/2023, 4:31 PM  Clinical Narrative:   CSW spoke with patient's wife, Ardeth Sportsman, about SNF choice and the family would like to choose [facility]. CSW spoke with Admissions at [facility], they will start insurance authorization. CSW sent updated therapy notes. CSW received call back from [facility] that they did not realize they were out of network with patient's insurance, they cannot offer a bed. CSW met with Ardeth Sportsman and spoke with daughter, Ines Bloomer, over the phone to provide update that [facility] is unable to accept. Family is asking about possible out of network benefits and coverage, CSW asked [facility] if the patient would be able to admit out of network. Admissions is asking Business Office, will update CSW once an answer is received. If [facility] is not an option, then second option would be [facility]. CSW to follow.    Expected Discharge Plan: Skilled Nursing Facility Barriers to Discharge: Continued Medical Work up, English as a second language teacher  Expected Discharge Plan and Services In-house Referral: Clinical Social Work   Post Acute Care Choice: Skilled Nursing Facility Living arrangements for the past 2 months: Single Family Home                                       Social Determinants of Health (SDOH) Interventions SDOH Screenings   Food Insecurity: Patient Unable To Answer (06/07/2023)  Housing: Patient Unable To Answer (06/07/2023)  Transportation Needs: Patient Unable To Answer (06/07/2023)  Utilities: Patient Unable To Answer (06/07/2023)  Depression (PHQ2-9): Low Risk  (12/03/2019)  Social Connections: Patient Unable To Answer (06/07/2023)  Tobacco Use: Medium Risk (03/06/2023)    Readmission Risk Interventions     No data  to display

## 2023-06-13 ENCOUNTER — Other Ambulatory Visit: Payer: Self-pay

## 2023-06-13 ENCOUNTER — Encounter (HOSPITAL_COMMUNITY): Payer: Self-pay | Admitting: Neurology

## 2023-06-13 LAB — GLUCOSE, CAPILLARY
Glucose-Capillary: 169 mg/dL — ABNORMAL HIGH (ref 70–99)
Glucose-Capillary: 156 mg/dL — ABNORMAL HIGH (ref 70–99)
Glucose-Capillary: 181 mg/dL — ABNORMAL HIGH (ref 70–99)
Glucose-Capillary: 188 mg/dL — ABNORMAL HIGH (ref 70–99)
Glucose-Capillary: 233 mg/dL — ABNORMAL HIGH (ref 70–99)
Glucose-Capillary: 185 mg/dL — ABNORMAL HIGH (ref 70–99)

## 2023-06-13 NOTE — Plan of Care (Signed)
  Problem: Self-Care: Goal: Ability to participate in self-care as condition permits will improve Outcome: Progressing Goal: Ability to communicate needs accurately will improve Outcome: Progressing   Problem: Nutrition: Goal: Risk of aspiration will decrease Outcome: Progressing Goal: Dietary intake will improve Outcome: Progressing   Problem: Clinical Measurements: Goal: Will remain free from infection Outcome: Progressing Goal: Cardiovascular complication will be avoided Outcome: Progressing   Problem: Nutrition: Goal: Adequate nutrition will be maintained Outcome: Progressing

## 2023-06-13 NOTE — Progress Notes (Signed)
PT Cancellation Note  Patient Details Name: Nathan Proctor. MRN: [id number] DOB: 09-21-1940   Cancelled Treatment:    Reason Eval/Treat Not Completed: (P) Patient declined, no reason specified, pt declining all mobility despite encouragement. Will check back as schedule allows to continue with PT POC.  Lenora Boys. PTA Acute Rehabilitation Services Office: [phone]    Catalina Antigua 06/13/2023, 3:08 PM

## 2023-06-13 NOTE — Progress Notes (Signed)
 Speech Language Pathology Treatment: Cognitive-Linquistic  Patient Details Name: Issac Moure. MRN: [id number] DOB: [birth year] Today's Date: 06/13/2023 Time: 1130-1150 SLP Time Calculation (min) (ACUTE ONLY): 20 min  Assessment / Plan / Recommendation Clinical Impression  Pt was seen for aphasia tx. Pt was more lethargic compared to previous session, which affected engagement. Both expressive and receptive language were targeted. Pt utilized phrase-length utterances during 50-60% of communicative opportunities. Focus of session was on conversation to increase pt engagement. Pt exhibited perseveration regarding sports after brief discussion of Super Bowl using a picture of TV. Pt participation decreased as session continued. Pt used a phrase-length utterance to express opinion about postural adjustment at end of session ("no more, please"). Pt benefited from errorless learning and multimodal cueing.   Recommend f/u with SLP to continue facilitating language production through functional conversation.    HPI HPI: Pt is a 83 y.o. male who was admitted as a code stroke on 02/05 due to acute onset of right-sided weakness, right facial droop and aphasia. MRI from 02/06 displayed an evolving acute left MCA infarct, most pronounced at the left insula and left frontal operculum. Pt has intentionally lost 300 pounds in the recent past, with pre-loss weight at ~600Ib. PMHx includes hypertension, diabetes, CKD, prior CVA.      SLP Plan  Continue with current plan of care      Recommendations for follow up therapy are one component of a multi-disciplinary discharge planning process, led by the attending physician.  Recommendations may be updated based on patient status, additional functional criteria and insurance authorization.    Recommendations                           Aphasia (R47.01)     Continue with current plan of care     [name]  06/13/2023, 1:16 PM

## 2023-06-14 DIAGNOSIS — I639 Cerebral infarction, unspecified: Secondary | ICD-10-CM | POA: Diagnosis not present

## 2023-06-14 DIAGNOSIS — I5043 Acute on chronic combined systolic (congestive) and diastolic (congestive) heart failure: Secondary | ICD-10-CM | POA: Diagnosis not present

## 2023-06-14 LAB — GLUCOSE, CAPILLARY
Glucose-Capillary: 166 mg/dL — ABNORMAL HIGH (ref 70–99)
Glucose-Capillary: 127 mg/dL — ABNORMAL HIGH (ref 70–99)
Glucose-Capillary: 213 mg/dL — ABNORMAL HIGH (ref 70–99)
Glucose-Capillary: 168 mg/dL — ABNORMAL HIGH (ref 70–99)
Glucose-Capillary: 183 mg/dL — ABNORMAL HIGH (ref 70–99)

## 2023-06-14 MED ORDER — BISACODYL 10 MG RE SUPP
10.0000 mg | Freq: Every day | RECTAL | Status: DC | PRN
  Administered 2023-06-18: 10 mg via RECTAL
  Filled 2023-06-14: qty 1

## 2023-06-14 MED ORDER — RENA-VITE PO TABS
1.0000 | ORAL_TABLET | Freq: Every day | ORAL | Status: DC
  Administered 2023-06-14: 1 via ORAL
  Filled 2023-06-14 (×2): qty 1

## 2023-06-14 NOTE — Progress Notes (Addendum)
STROKE TEAM PROGRESS NOTE   SUBJECTIVE (INTERVAL HISTORY) Patient is seen in his room with 1 family member at the bedside.  His neurological exam remains stable.  He did have a presyncopal episode with mild hypotension while using the bedside commode this morning but recovered well and is now hemodynamically stable again. Patient had a brief presyncopal episode while using the bedside body and felt lightheaded and dizzy for a while and then gradually improved. OBJECTIVE Temp:  [97.2 F (36.2 C)-99.5 F (37.5 C)] 98 F (36.7 C) (02/14 1215) Pulse Rate:  [95-110] 110 (02/14 1215) Cardiac Rhythm: Normal sinus rhythm (02/14 0834) Resp:  [16-20] 16 (02/14 1215) BP: (76-164)/(53-98) 121/62 (02/14 1251) SpO2:  [94 %-100 %] 100 % (02/14 1251) Weight:  [122.5 kg] 122.5 kg (02/14 0414)  Recent Labs  Lab 06/13/23 1948 06/13/23 2311 06/14/23 0342 06/14/23 0743 06/14/23 1152  GLUCAP 233* 185* 166* 127* 213*   Recent Labs  Lab 06/08/23 0556 06/09/23 0720 06/10/23 0613 06/11/23 0544  NA 145 144 147* 146*  K 4.2 4.2 4.3 3.8  CL 112* 113* 110 109  CO2 25 25 27 25   GLUCOSE 283* 290* 128* 128*  BUN 42* 42* 43* 45*  CREATININE 2.03* 1.87* 1.78* 1.81*  CALCIUM 8.8* 9.0 9.5 9.5   No results for input(s): "AST", "ALT", "ALKPHOS", "BILITOT", "PROT", "ALBUMIN" in the last 168 hours.  Recent Labs  Lab 06/08/23 0556 06/09/23 0720 06/10/23 0613 06/11/23 0544  WBC 7.4 7.8 8.2 8.1  HGB 9.3* 9.7* 10.4* 10.0*  HCT 29.6* 31.2* 33.0* 31.6*  MCV 96.7 97.8 97.9 96.6  PLT 140* 144* 165 187   No results for input(s): "CKTOTAL", "CKMB", "CKMBINDEX", "TROPONINI" in the last 168 hours. No results for input(s): "LABPROT", "INR" in the last 72 hours.  No results for input(s): "COLORURINE", "LABSPEC", "PHURINE", "GLUCOSEU", "HGBUR", "BILIRUBINUR", "KETONESUR", "PROTEINUR", "UROBILINOGEN", "NITRITE", "LEUKOCYTESUR" in the last 72 hours.  Invalid input(s): "APPERANCEUR"      Component Value  Date/Time   CHOL 183 06/06/2023 0557   TRIG 81 06/06/2023 0557   HDL 60 06/06/2023 0557   CHOLHDL 3.1 06/06/2023 0557   VLDL 16 06/06/2023 0557   LDLCALC 107 (H) 06/06/2023 0557   LDLCALC 223 (H) 05/31/2021 0000   Lab Results  Component Value Date   HGBA1C 6.1 (H) 06/05/2023      Component Value Date/Time   LABOPIA NONE DETECTED 06/05/2023 1653   COCAINSCRNUR NONE DETECTED 06/05/2023 1653   LABBENZ NONE DETECTED 06/05/2023 1653   AMPHETMU NONE DETECTED 06/05/2023 1653   THCU NONE DETECTED 06/05/2023 1653   LABBARB NONE DETECTED 06/05/2023 1653    No results for input(s): "ETH" in the last 168 hours.   I have personally reviewed the radiological images below and agree with the radiology interpretations.  DG Swallowing Func-Speech Pathology Result Date: 06/12/2023 Table formatting from the original result was not included. Modified Barium Swallow Study Patient Details Name: Shai Mckenzie. MRN: [id number] Date of Birth: 1941-01-04 Today's Date: 06/12/2023 HPI/PMH: HPI: Pt is a 83 y.o. male who was admitted as a code stroke on 02/05 due to acute onset of right-sided weakness, right facial droop and aphasia. MRI from 02/06 displayed an evolving acute left MCA infarct, most pronounced at the left insula and left frontal operculum. Pt has intentionally lost 300 pounds in the recent past, with pre-loss weight at ~600Ib. PMHx includes hypertension, diabetes, CKD, prior CVA. Clinical Impression: Clinical Impression: Pt presents with a mild-moderate oropharyngeal dysphagia (DIGEST Score: 1) primarily characterized  by silent penetration of thin liquid at level of VFs, flash penetration of nectar-thick above VFs, and diffuse pharyngeal residue. Pt's dysphagia severity level dropped from DIGEST score of 3 to 1, compared to previous MBS eval on 02/07.     Pharyngeal residue was present throughout all consistencies. Pt's posture for swallowing was consistently neutral. Pt follows commands for  compensatory strategies more readily now with max multimodal cueing. Effective compensatory strategies include multiple swallows, throat clearing, and a straw when used with a verbally cued slow rate. Multiple consecutive sips of thin-liquid presented the most risk for aspiration due to silent penetration of Vfs. Unlike previous MBS study, pt tolerated solid consistency, displaying prolonged mastication but majority clearance of bolus after 2-3 swallows.     Recommendations include upgrading pt's diet to dysphagia 2 (finely chopped) and thin liquids to optimize safety and efficiency for swallowing. SLP f/u to monitor tolerance of thin-liquids with compensatory strategies is highly recommended. Pt needs to remain upright for 30 minutes after PO intake due to remaining pharyngeal residue to optimize airway safety. Factors that may increase risk of adverse event in presence of aspiration [name] & Clearance [name] 2021): Factors that may increase risk of adverse event in presence of aspiration [name] & Clearance [name] 2021): Dependence for feeding and/or oral hygiene Recommendations/Plan: Swallowing Evaluation Recommendations Swallowing Evaluation Recommendations Recommendations: PO diet PO Diet Recommendation: Dysphagia 2 (Finely chopped); Thin liquids (Level 0) Liquid Administration via: Straw Medication Administration: Crushed with puree Supervision: Full supervision/cueing for swallowing strategies; Full assist for feeding Swallowing strategies  : Slow rate; Small bites/sips; Multiple dry swallows after each bite/sip; Clear throat intermittently Postural changes: Position pt fully upright for meals; Stay upright 30-60 min after meals Oral care recommendations: Oral care BID (2x/day); Staff/trained caregiver to provide oral care Treatment Plan Treatment Plan Follow-up recommendations: Follow physicians's recommendations for discharge plan and follow up therapies Functional status assessment: Patient has had a recent decline in  their functional status and demonstrates the ability to make significant improvements in function in a reasonable and predictable amount of time. Recommendations Recommendations for follow up therapy are one component of a multi-disciplinary discharge planning process, led by the attending physician.  Recommendations may be updated based on patient status, additional functional criteria and insurance authorization. Assessment: Orofacial Exam: Orofacial Exam Oral Cavity: Oral Hygiene: WFL Oral Cavity - Dentition: Adequate natural dentition Orofacial Anatomy: WFL Anatomy: Anatomy: WFL Boluses Administered: Boluses Administered Boluses Administered: Thin liquids (Level 0); Mildly thick liquids (Level 2, nectar thick); Moderately thick liquids (Level 3, honey thick); Puree; Solid  Oral Impairment Domain: Oral Impairment Domain Lip Closure: Interlabial escape, no progression to anterior lip Tongue control during bolus hold: Cohesive bolus between tongue to palatal seal Bolus preparation/mastication: Slow prolonged chewing/mashing with complete recollection Bolus transport/lingual motion: Brisk tongue motion Oral residue: Residue collection on oral structures Location of oral residue : Tongue Initiation of pharyngeal swallow : Pyriform sinuses  Pharyngeal Impairment Domain: Pharyngeal Impairment Domain Soft palate elevation: No bolus between soft palate (SP)/pharyngeal wall (PW) Laryngeal elevation: Partial superior movement of thyroid cartilage/partial approximation of arytenoids to epiglottic petiole Anterior hyoid excursion: Complete anterior movement Epiglottic movement: Complete inversion Laryngeal vestibule closure: Incomplete, narrow column air/contrast in laryngeal vestibule Pharyngeal stripping wave : Present - complete Pharyngeal contraction (A/P view only): N/A Pharyngoesophageal segment opening: Complete distension and complete duration, no obstruction of flow Tongue base retraction: Trace column of contrast  or air between tongue base and PPW Pharyngeal residue: Collection of residue within or on pharyngeal  structures Location of pharyngeal residue: Diffuse (>3 areas)  Esophageal Impairment Domain: Esophageal Impairment Domain Esophageal clearance upright position: -- (Not tested) Pill: No data recorded Penetration/Aspiration Scale Score: Penetration/Aspiration Scale Score 1.  Material does not enter airway: Solid; Puree; Moderately thick liquids (Level 3, honey thick) 2.  Material enters airway, remains ABOVE vocal cords then ejected out: Mildly thick liquids (Level 2, nectar thick) 5.  Material enters airway, CONTACTS cords and not ejected out: Thin liquids (Level 0) Compensatory Strategies: Compensatory Strategies Compensatory strategies: Yes Straw: Effective Effective Straw: Mildly thick liquid (Level 2, nectar thick) (Effective with slow rate applied.) Multiple swallows: Effective Effective Multiple Swallows: Thin liquid (Level 0); Mildly thick liquid (Level 2, nectar thick); Moderately thick liquid (Level 3, honey thick); Puree; Solid   General Information: Caregiver present: No  Diet Prior to this Study: Dysphagia 1 (pureed); Moderately thick liquids (Level 3, honey thick)   Temperature : Normal   Respiratory Status: WFL   Supplemental O2: None (Room air)   History of Recent Intubation: Yes  Behavior/Cognition: Alert; Cooperative; Pleasant mood; Requires cueing Self-Feeding Abilities: Needs assist with self-feeding Baseline vocal quality/speech: Normal Volitional Cough: Unable to elicit Volitional Swallow: Able to elicit Exam Limitations: No limitations Goal Planning: Prognosis for improved oropharyngeal function: Good Barriers to Reach Goals: Language deficits No data recorded No data recorded No data recorded Pain: Pain Assessment Pain Assessment: No/denies pain Pain Score: 0 End of Session: Start Time:SLP Start Time (ACUTE ONLY): 1310 Stop Time: SLP Stop Time (ACUTE ONLY): 1335 Time Calculation:SLP Time  Calculation (min) (ACUTE ONLY): 25 min Charges: SLP Evaluations $ SLP Speech Visit: 1 Visit SLP Evaluations $MBS Swallow: 1 Procedure $Swallowing Treatment: 1 Procedure SLP visit diagnosis: SLP Visit Diagnosis: Dysphagia, oropharyngeal phase (R13.12) Past Medical History: Past Medical History: Diagnosis Date  Anemia of chronic disease   Ankle fracture 02/15/2016  Cancer (HCC)   Prostate  Chronic constipation   Chronic kidney disease   stage III  Diabetes mellitus without complication (HCC)   type II   Diabetic peripheral neuropathy (HCC)   Failure to thrive (0-17)   Fracture of left lower leg   Gout   Hyperlipidemia   Hypertension   Morbid obesity (HCC)   Unstable gait  Past Surgical History: Past Surgical History: Procedure Laterality Date  IR CT HEAD LTD  06/05/2023  IR INTRAVSC STENT CERV CAROTID W/O EMB-PROT MOD SED INC ANGIO  06/05/2023  IR PERCUTANEOUS ART THROMBECTOMY/INFUSION INTRACRANIAL INC DIAG ANGIO  06/05/2023  IR US GUIDE VASC ACCESS RIGHT  06/05/2023  ORIF ANKLE FRACTURE Left 02/29/2016  Procedure: OPEN REDUCTION INTERNAL FIXATION (ORIF) ANKLE FRACTURE;  Surgeon: Yolonda Kida, MD;  Location: [facility];  Service: Orthopedics;  Laterality: Left;  PROSTATE SURGERY    RADIOLOGY WITH ANESTHESIA N/A 06/05/2023  Procedure: IR WITH ANESTHESIA;  Surgeon: Radiologist, Medication, MD;  Location: [facility];  Service: Radiology;  Laterality: N/A; Claudine Mouton 06/12/2023, 3:16 PM  DG Abd 1 View Result Date: 06/09/2023 CLINICAL DATA:  [id number]. Encounter for feeding tube placement. EXAM: ABDOMEN - 1 VIEW COMPARISON:  Abdomen film 06/05/2023. FINDINGS: Dobbhoff feeding tube is well placed with the radiopaque tip in the distal stomach. The visualized bowel pattern is nonobstructive. There is barium newly seen in the flexures and transverse colon. There is no supine evidence of free air. There is atelectasis in the lung bases. Cardiomegaly. IMPRESSION: 1. Dobbhoff feeding tube well placed with the radiopaque tip in  the distal stomach. 2. Nonobstructive bowel gas pattern. 3. Cardiomegaly.  Electronically Signed   By: Almira Bar M.D.   On: 06/09/2023 03:41   DG CHEST PORT 1 VIEW Result Date: 06/08/2023 CLINICAL DATA:  CHF EXAM: PORTABLE CHEST 1 VIEW COMPARISON:  01/06/2023 FINDINGS: Feeding tube in place. There is cardiomegaly with vascular congestion. Chronic elevation of the right hemidiaphragm with right base atelectasis. Interstitial prominence throughout the lungs, right greater than left could reflect interstitial edema. Possible small effusions. No acute bony abnormality. IMPRESSION: Cardiomegaly with vascular congestion and probable mild interstitial edema. Question small bilateral effusions. Chronic elevation of the right hemidiaphragm with right base atelectasis. Electronically Signed   By: Charlett Nose M.D.   On: 06/08/2023 18:20   DG Swallowing Func-Speech Pathology Result Date: 06/07/2023 Table formatting from the original result was not included. Modified Barium Swallow Study Study completed and documented by [name], SLP Student Supervised and reviewed by Harlon Ditty, MA CCC-SLP [facility] Secure Chat Preferred Office [phone] Patient Details Name: Bandy Honaker. MRN: [id number] Date of Birth: [birth year] Today's Date: 06/07/2023 HPI/PMH: HPI: Pt is a 83 y.o. male who was admitted as a code stroke on 02/05 due to acute onset of right-sided weakness, right facial droop and aphasia. MRI from 02/06 displayed an evolving acute left MCA infarct, most pronounced at the left insula and left frontal operculum. Pt has intentionally lost 300 pounds in the recent past, with pre-loss weight at ~600Ib. PMHx includes hypertension, diabetes, CKD, prior CVA. Clinical Impression: Clinical Impression: Pt presented with a moderate-severe oropharyngeal dysphagia (DIGEST Score: 3) primarily characterized by silent aspiration of thin liquid, silent penetration of nectar-thick liquid, diffuse  pharyngeal residue collection during thin/nectar-thick consistencies, and posterior-escape of bolus across multiple consistencies. Pt positioning was a potential limitation for examination due to consistent posterior head position. More anterior head positioning during nectar-thick liquid displayed less pharyngeal residue, increased hyo-laryngeal excursion, and overall better airway safety. This positioning was not present throughout due to pt's receptive language deficits to understand cues. Alternating between honey-thick and puree consistencies provided more oropharyngeal bolus clearance and no signs of aspiration. Recommend a dysphagia 1 (puree) and honey-thick liquid diet to optimize pt's airway safety and potential for meeting nutritional needs. Slow rate, small bites/sips, natural head posturing, and multiple swallows are compensatory strategies that displayed best efficiency with pt. F/u with SLP for family education, compensatory strategy training, and upgraded diet trials is recommended. Factors that may increase risk of adverse event in presence of aspiration [name] & Clearance [name] 2021): Factors that may increase risk of adverse event in presence of aspiration [name] & Clearance [name] 2021): Weak cough Recommendations/Plan: Swallowing Evaluation Recommendations Swallowing Evaluation Recommendations Recommendations: PO diet PO Diet Recommendation: Dysphagia 1 (Pureed); Moderately thick liquids (Level 3, honey thick) Liquid Administration via: Spoon Medication Administration: Crushed with puree Supervision: Full supervision/cueing for swallowing strategies; Full assist for feeding Swallowing strategies  : Slow rate; Small bites/sips; Multiple dry swallows after each bite/sip (Pt needs to be in a neutral head position (no posterior head tilt).) Postural changes: Position pt fully upright for meals; Stay upright 30-60 min after meals Oral care recommendations: Oral care BID (2x/day); Staff/trained caregiver to provide  oral care Caregiver Recommendations: Have oral suction available Treatment Plan Treatment Plan Treatment recommendations: Therapy as outlined in treatment plan below Follow-up recommendations: Follow physicians's recommendations for discharge plan and follow up therapies Functional status assessment: Patient has had a recent decline in their functional status and demonstrates the ability to make significant improvements in function in a reasonable and predictable amount  of time. Treatment frequency: Min 2x/week Treatment duration: 2 weeks Interventions: Aspiration precaution training; Compensatory techniques; Patient/family education; Diet toleration management by SLP; Trials of upgraded texture/liquids Recommendations Recommendations for follow up therapy are one component of a multi-disciplinary discharge planning process, led by the attending physician.  Recommendations may be updated based on patient status, additional functional criteria and insurance authorization. Assessment: Orofacial Exam: Orofacial Exam Oral Cavity - Dentition: Adequate natural dentition Orofacial Anatomy: WFL Anatomy: Anatomy: WFL Boluses Administered: Boluses Administered Boluses Administered: Thin liquids (Level 0); Mildly thick liquids (Level 2, nectar thick); Moderately thick liquids (Level 3, honey thick); Puree; Solid  Oral Impairment Domain: Oral Impairment Domain Lip Closure: Escape progressing to mid-chin Tongue control during bolus hold: Posterior escape of greater than half of bolus Bolus preparation/mastication: -- (Pt did not chew solid and SLP had to remove it from mouth.) Bolus transport/lingual motion: Repetitive/disorganized tongue motion Oral residue: Residue collection on oral structures Location of oral residue : Tongue; Palate Initiation of pharyngeal swallow : Pyriform sinuses  Pharyngeal Impairment Domain: Pharyngeal Impairment Domain Soft palate elevation: No bolus between soft palate (SP)/pharyngeal wall (PW)  Laryngeal elevation: Partial superior movement of thyroid cartilage/partial approximation of arytenoids to epiglottic petiole Anterior hyoid excursion: Partial anterior movement Epiglottic movement: Partial inversion Laryngeal vestibule closure: Incomplete, narrow column air/contrast in laryngeal vestibule Pharyngeal stripping wave : Present - complete Pharyngeal contraction (A/P view only): N/A Pharyngoesophageal segment opening: Complete distension and complete duration, no obstruction of flow Tongue base retraction: Narrow column of contrast or air between tongue base and PPW Pharyngeal residue: Collection of residue within or on pharyngeal structures Location of pharyngeal residue: Diffuse (>3 areas)  Esophageal Impairment Domain: Esophageal Impairment Domain Esophageal clearance upright position: -- (Not tested.) Pill: No data recorded Penetration/Aspiration Scale Score: Penetration/Aspiration Scale Score 1.  Material does not enter airway: Puree; Moderately thick liquids (Level 3, honey thick) 3.  Material enters airway, remains ABOVE vocal cords and not ejected out: Mildly thick liquids (Level 2, nectar thick) 8.  Material enters airway, passes BELOW cords without attempt by patient to eject out (silent aspiration) : Thin liquids (Level 0) Compensatory Strategies: Compensatory Strategies Compensatory strategies: Yes Straw: Ineffective Multiple swallows: Effective (Required max cueing due to pt's language.) Effective Multiple Swallows: Puree; Moderately thick liquid (Level 3, honey thick); Mildly thick liquid (Level 2, nectar thick)   General Information: Caregiver present: No  Diet Prior to this Study: NPO   Temperature : Normal   Respiratory Status: WFL   Supplemental O2: None (Room air)   No data recorded Behavior/Cognition: Alert; Cooperative; Pleasant mood; Requires cueing Self-Feeding Abilities: Needs assist with self-feeding Baseline vocal quality/speech: Dysphonic Volitional Cough: Unable to elicit  Volitional Swallow: Able to elicit Exam Limitations: Poor positioning Goal Planning: Prognosis for improved oropharyngeal function: Good Barriers to Reach Goals: Language deficits No data recorded No data recorded Consulted and agree with results and recommendations: Pt unable/family or caregiver not available Pain: Pain Assessment Pain Assessment: No/denies pain Faces Pain Scale: 0 Facial Expression: 0 Body Movements: 0 Muscle Tension: 0 Compliance with ventilator (intubated pts.): N/A Vocalization (extubated pts.): 0 CPOT Total: 0 End of Session: Start Time:SLP Start Time (ACUTE ONLY): 6213 Stop Time: SLP Stop Time (ACUTE ONLY): 0930 Time Calculation:SLP Time Calculation (min) (ACUTE ONLY): 27 min Charges: SLP Evaluations $ SLP Speech Visit: 1 Visit SLP Evaluations $BSS Swallow: 1 Procedure $MBS Swallow: 1 Procedure $ SLP EVAL LANGUAGE/SOUND PRODUCTION: 1 Procedure $Swallowing Treatment: 1 Procedure SLP visit diagnosis: SLP Visit Diagnosis: Dysphagia,  oropharyngeal phase (R13.12) Past Medical History: Past Medical History: Diagnosis Date  Anemia of chronic disease   Ankle fracture 02/15/2016  Cancer (HCC)   Prostate  Chronic constipation   Chronic kidney disease   stage III  Diabetes mellitus without complication (HCC)   type II   Diabetic peripheral neuropathy (HCC)   Failure to thrive (0-17)   Fracture of left lower leg   Gout   Hyperlipidemia   Hypertension   Morbid obesity (HCC)   Unstable gait  Past Surgical History: Past Surgical History: Procedure Laterality Date  IR CT HEAD LTD  06/05/2023  IR INTRAVSC STENT CERV CAROTID W/O EMB-PROT MOD SED INC ANGIO  06/05/2023  IR PERCUTANEOUS ART THROMBECTOMY/INFUSION INTRACRANIAL INC DIAG ANGIO  06/05/2023  IR US GUIDE VASC ACCESS RIGHT  06/05/2023  ORIF ANKLE FRACTURE Left 02/29/2016  Procedure: OPEN REDUCTION INTERNAL FIXATION (ORIF) ANKLE FRACTURE;  Surgeon: Yolonda Kida, MD;  Location: [facility];  Service: Orthopedics;  Laterality: Left;  PROSTATE SURGERY    RADIOLOGY  WITH ANESTHESIA N/A 06/05/2023  Procedure: IR WITH ANESTHESIA;  Surgeon: Radiologist, Medication, MD;  Location: [facility];  Service: Radiology;  Laterality: N/A; [place], Riley Nearing 06/07/2023, 12:15 PM  CT HEAD WO CONTRAST ( ) Result Date: 06/07/2023 CLINICAL DATA:  83 year old male status post code stroke presentation, left MCA M2 occlusion, with left MCA infarct with confluent petechial hemorrhage. EXAM: CT HEAD WITHOUT CONTRAST TECHNIQUE: Contiguous axial images were obtained from the base of the skull through the vertex without intravenous contrast. RADIATION DOSE REDUCTION: This exam was performed according to the departmental dose-optimization program which includes automated exposure control, adjustment of the mA and/or kV according to patient size and/or use of iterative reconstruction technique. COMPARISON:  Brain MRI yesterday.  Head CT 06/05/2023. FINDINGS: Brain: Confluent mixed density infarct in the left MCA middle division, epicenter at the insula and operculum. Confluent petechial hemorrhage on series 3, image 16 appears stable from the MRI. Superimposed trace subarachnoid blood is difficult to exclude by CT (series 3, image 13), but was not apparent on MRI. Stable mild mass effect on the left lateral ventricle with only trace rightward midline shift (series 3, image 16). No ventriculomegaly. Elsewhere gray-white differentiation is stable from the presentation CT. Basilar cisterns remain patent. Vascular: Resolved hyperdense left MCA since presentation. Calcified atherosclerosis at the skull base. Skull: Stable, intact. Sinuses/Orbits: Visualized paranasal sinuses and mastoids are stable and well aerated. Other: Left nasoenteric tube now in place. Stable orbit and scalp soft tissues. IMPRESSION: 1. Stable by CT confluent Left MCA middle division infarct with petechial hemorrhage (Heidelberg classification 1b: HI2, confluent petechiae, no mass effect). Questionable trace superimposed SAH, but was  not apparent on MRI. 2. Stable minimal intracranial mass effect. 3. No new intracranial abnormality. Electronically Signed   By: Odessa Fleming M.D.   On: 06/07/2023 05:37   MR BRAIN WO CONTRAST Result Date: 06/06/2023 CLINICAL DATA:  Follow-up examination for stroke. EXAM: MRI HEAD WITHOUT CONTRAST TECHNIQUE: Multiplanar, multiecho pulse sequences of the brain and surrounding structures were obtained without intravenous contrast. COMPARISON:  Comparison made to multiple previous exams from 06/05/2023. FINDINGS: Brain: Examination degraded by motion artifact. Cerebral volume within normal limits for age. Patchy T2/FLAIR hyperintensity involving the periventricular deep white matter both cerebral hemispheres, consistent with chronic small vessel ischemic disease, mild in nature. Small remote infarct noted within the right cerebellum. Confluent restricted diffusion involving the left insula and left frontal operculum, consistent with evolving acute left MCA distribution infarct. Area of infarction  measures up to approximately 6 cm in AP diameter. Prominent associated susceptibility artifact, consistent with petechial hemorrhage (series 7, image 61) ( Heidelberg classification 1b: HI2, confluent petechiae, no mass effect. No frank or organized hematoma evident by MRI. No significant regional mass effect. Few additional small foci of infarction noted within the left parieto-occipital region posteriorly (series 3, images 40, 29). Apparent diffusion signal along the right frontal parafalcine region felt to be consistent with artifact related to dural calcification. Note made of a few additional chronic micro hemorrhages within the left cerebellum and right thalamus. No mass lesion or midline shift. Ventricles normal size without hydrocephalus. No extra-axial fluid collection. Pituitary gland suprasellar region within normal limits. Vascular: Major intracranial vascular flow voids are maintained. Skull and upper cervical spine:  Craniocervical junction within normal limits. Bone marrow signal intensity overall within normal limits. No scalp soft tissue abnormality. Sinuses/Orbits: Prior bilateral ocular lens replacement. Paranasal sinuses are clear. No mastoid effusion. Other: None. IMPRESSION: 1. Evolving acute left MCA distribution infarct, most pronounced at the left insula and left frontal operculum. Prominent associated petechial hemorrhage without frank intraparenchymal hematoma (Heidelberg classification 1b: HI2, confluent petechiae, no mass effect). 2. Few additional small foci of acute infarction within the left parieto-occipital region posteriorly. 3. Underlying mild chronic microvascular ischemic disease with small remote right cerebellar infarct. Electronically Signed   By: Rise Mu M.D.   On: 06/06/2023 03:12   ECHOCARDIOGRAM COMPLETE Result Date: 06/05/2023    ECHOCARDIOGRAM REPORT   Patient Name:   Ponce Skillman. Date of Exam: 06/05/2023 Medical Rec #:  [id number]              Height:       70.0 in Accession #:    [id number]             Weight:       292.3 lb Date of Birth:  October 21, 1940              BSA:          2.453 m Patient Age:    82 years               BP:           129/72 mmHg Patient Gender: M                      HR:           89 bpm. Exam Location:  Inpatient Procedure: 2D Echo, Color Doppler, Cardiac Doppler and Intracardiac            Opacification Agent Indications:    Stroke I63.9  History:        Patient has prior history of Echocardiogram examinations, most                 recent 10/06/2019. Risk Factors:Diabetes, Hypertension and                 Dyslipidemia.  Sonographer:    Harriette Bouillon RDCS Referring Phys: Lynnae January IMPRESSIONS  1. Left ventricular ejection fraction, by estimation, is 30%. The left ventricle has moderately decreased function. The left ventricle demonstrates global hypokinesis. There is mild left ventricular hypertrophy.  2. Right ventricular systolic function is  normal. The right ventricular size is normal.  3. Trivial mitral valve regurgitation.  4. The aortic valve is tricuspid. Aortic valve regurgitation is not visualized.  5. The inferior vena cava is normal in size with greater  than 50% respiratory variability, suggesting right atrial pressure of 3 mmHg. Comparison(s): The left ventricular function is worsened. FINDINGS  Left Ventricle: Left ventricular ejection fraction, by estimation, is 30%. The left ventricle has moderately decreased function. The left ventricle demonstrates global hypokinesis. Definity contrast agent was given IV to delineate the left ventricular endocardial borders. The left ventricular internal cavity size was normal in size. There is mild left ventricular hypertrophy. Right Ventricle: The right ventricular size is normal. Right vetricular wall thickness was not assessed. Right ventricular systolic function is normal. Left Atrium: Left atrial size was normal in size. Right Atrium: Right atrial size was normal in size. Pericardium: There is no evidence of pericardial effusion. Mitral Valve: There is mild thickening of the mitral valve leaflet(s). Mild to moderate mitral annular calcification. Trivial mitral valve regurgitation. Tricuspid Valve: The tricuspid valve is normal in structure. Tricuspid valve regurgitation is trivial. Aortic Valve: The aortic valve is tricuspid. Aortic valve regurgitation is not visualized. Pulmonic Valve: The pulmonic valve was normal in structure. Pulmonic valve regurgitation is not visualized. Aorta: The aortic root and ascending aorta are structurally normal, with no evidence of dilitation. Venous: The inferior vena cava is normal in size with greater than 50% respiratory variability, suggesting right atrial pressure of 3 mmHg. IAS/Shunts: The interatrial septum was not assessed.  LEFT VENTRICLE PLAX 2D LVIDd:         5.00 cm   Diastology LVIDs:         4.40 cm   LV e' lateral: 5.33 cm/s LV PW:         1.20 cm LV  IVS:        1.10 cm LVOT diam:     2.30 cm LV SV:         55 LV SV Index:   23 LVOT Area:     4.15 cm  RIGHT VENTRICLE RV S prime:     14.40 cm/s LEFT ATRIUM         Index LA diam:    4.60 cm 1.88 cm/m  AORTIC VALVE LVOT Vmax:   65.50 cm/s LVOT Vmean:  45.200 cm/s LVOT VTI:    0.133 m  AORTA Ao Root diam: 3.00 cm Ao Asc diam:  3.50 cm  SHUNTS Systemic VTI:  0.13 m Systemic Diam: 2.30 cm Dietrich Pates MD Electronically signed by Dietrich Pates MD Signature Date/Time: 06/05/2023/9:33:38 PM    Final    DG Abd Portable 1V Result Date: 06/05/2023 CLINICAL DATA:  Feeding tube placement. EXAM: PORTABLE ABDOMEN - 1 VIEW COMPARISON:  None Available. FINDINGS: Distal tip of feeding tube is seen in expected position of distal stomach. IMPRESSION: Distal tip of feeding tube seen in expected position of distal stomach. Electronically Signed   By: Lupita Raider M.D.   On: 06/05/2023 15:49   CT HEAD WO CONTRAST Result Date: 06/05/2023 CLINICAL DATA:  Stroke, follow-up. Status post intracranial mechanical thrombectomy and stenting of the left ICA bifurcation. EXAM: CT HEAD WITHOUT CONTRAST TECHNIQUE: Contiguous axial images were obtained from the base of the skull through the vertex without intravenous contrast. RADIATION DOSE REDUCTION: This exam was performed according to the departmental dose-optimization program which includes automated exposure control, adjustment of the mA and/or kV according to patient size and/or use of iterative reconstruction technique. COMPARISON:  CT head without contrast and CT angio head and neck 06/05/2023. By plain CT 06/05/2023. FINDINGS: Brain: The study is mildly degraded by patient motion. The left insular and left opercular infarct is  somewhat obscured by patient motion. The cortex is slightly hyperdense, potentially reflecting reperfusion. No hemorrhage is present. Basal ganglia are intact. Subcortical white matter hypoattenuation in the high left frontal lobe is new. Mild right-sided white  matter disease is stable. Basal ganglia are intact. A remote lacunar infarct is again noted in the right cerebellum. The brainstem and cerebellum are otherwise within normal limits. Vascular: Atherosclerotic calcifications are present within the cavernous internal carotid arteries and at the normal origin of both vertebral arteries. No hyperdense vessel is present. Skull: Calvarium is intact. No focal lytic or blastic lesions are present. No significant extracranial soft tissue lesion is present. Sinuses/Orbits: The paranasal sinuses and mastoid air cells are clear. Bilateral lens replacements are noted. Globes and orbits are otherwise unremarkable. IMPRESSION: 1. The left insular and left opercular infarct is somewhat obscured by patient motion. 2. The cortex is slightly hyperdense, potentially reflecting reperfusion. 3. No hemorrhage. 4. Subcortical white matter hypoattenuation in the high left frontal lobe is new. This may reflect ischemic changes or edema related to the reperfusion. 5. Remote lacunar infarct of the right cerebellum. 6. Stable mild right-sided white matter disease. This likely reflects the sequela of chronic microvascular ischemia. Electronically Signed   By: Marin Roberts M.D.   On: 06/05/2023 14:38   IR PERCUTANEOUS ART THROMBECTOMY/INFUSION INTRACRANIAL INC DIAG ANGIO Result Date: 06/05/2023 INDICATION: 83 year old male presenting with right-sided weakness and aphasia; NIHSS 28. His last known well was 10 p.m. on 06/04/2023. His past medical history significant for prior stroke, hypertension, diabetes and chronic kidney disease; baseline modified Rankin scale 0. Head CT showed hypodensity within the left insula, basal ganglia and left frontal operculum (ASPECTS 7). No IV thrombolytic given as patient was outside the window. CT angiogram of the head and neck showed an occlusion of a left M2/MCA anterior division branch. CT perfusion showed a 41 mL core infarct with a 45 mL ischemic  penumbra. She was transferred to our service for mechanical thrombectomy. EXAM: ULTRASOUND-GUIDED VASCULAR ACCESS DIAGNOSTIC CEREBRAL ANGIOGRAM MECHANICAL THROMBECTOMY FLAT PANEL HEAD CT LEFT CAROTID STENTING AND ANGIOPLASTY WITHOUT CEREBRAL PROTECTION DEVICE COMPARISON:  CT/CT angiogram of the head and neck June 05, 2023. MEDICATIONS: No antibiotics administered. ANESTHESIA/SEDATION: The procedure was performed under general anesthesia. CONTRAST:  80 mL of Omnipaque 300 milligram/mL FLUOROSCOPY: Radiation Exposure Index (as provided by the fluoroscopic device): 1315 mGy Kerma COMPLICATIONS: None immediate. TECHNIQUE: Informed written consent was obtained from the patient's wife after a thorough discussion of the procedural risks, benefits and alternatives. All questions were addressed. Maximal Sterile Barrier Technique was utilized including caps, mask, sterile gowns, sterile gloves, sterile drape, hand hygiene and skin antiseptic. A timeout was performed prior to the initiation of the procedure. The right groin was prepped and draped in the usual sterile fashion. Using a micropuncture kit and the modified Seldinger technique, access was gained to the right common femoral artery and an 8 French sheath was placed. Real-time ultrasound guidance was utilized for vascular access including the acquisition of a permanent ultrasound image documenting patency of the accessed vessel. Under fluoroscopy, an 8 [place] Walrus balloon guide catheter was navigated over a 6 [place] VTK catheter and a 0.035" Terumo Glidewire into the aortic arch. The catheter was placed into the left common carotid artery and then advanced into the left internal carotid artery. The diagnostic catheter was removed. Frontal and lateral angiograms of the head were obtained. FINDINGS: 1. Ultrasound showed heavily calcified right common femoral artery is maintained patency and caliber. 2. Proximal  occlusion of a left M2/MCA anterior division branch. 3.  Atherosclerotic changes of the intracranial left ICA with mild stenosis at the distal cavernous segment. 4. 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 PROCEDURE: Using biplane roadmap guidance, a Red 62 aspiration catheter was navigated over Colossus 35 microguidewire into the cavernous segment of the left ICA. The aspiration catheter was then advanced to the level of occlusion and connected to an aspiration pump. Continuous aspiration was performed for 2 minutes. The guide catheter was connected to a VacLok syringe and the guiding catheter balloon was inflated. The aspiration catheter was subsequently removed under constant aspiration. The guide catheter was aspirated for debris. Left internal carotid artery angiograms with frontal and lateral views of the head showed complete recanalization of the left MCA vascular tree. The guide catheter was retracted into the neck. Frontal and lateral angiograms of the neck were obtained. Improvement of the degree of stenosis in the carotid bulb compared to prior CT angiogram. There is prominent luminal irregularity at the carotid bulb residual moderate stenosis. Increased tortuosity of the proximal/mid cervical left ICA with kinking. Left internal carotid artery angiograms with left anterior oblique views of the neck showed evidence of filling defects at the level of the carotid bulb. Flat panel CT of the head was obtained and post processed in a separate workstation with concurrent attending physician supervision. Selected images were sent to PACS. No evidence of hemorrhagic complication. There is mild contrast staining of the left insular and frontal cortex. Repeat left internal carotid artery angiograms with frontal and lateral views of the head showed Amy seen left M3/MCA branch to the left parietal region. Left common carotid artery angiograms with frontal and lateral  views of the neck showed vertebra Gretchen of stenosis at the left ICA bulb with more prominent filling defect. At this point, patient was loaded on cangrelor followed by continuous drip. Using biplane roadmap guidance, a 4-7 mm Emboshield NAV6 cerebral protection device was advanced into the cervical left ICA. However, multiple attempts to advance a cerebral protection device through the left ICA kinking proved unsuccessful. The cerebral protection device was subsequently removed. Using biplane roadmap guidance, a 10-8 x 40 mm XACT carotid stent was navigated and deployed from the distal left common carotid artery to the proximal left internal carotid artery, proximal to the vessel kinking. Suboptimal stent expansion was noted. Then, a 6 x 30 mm Viatrac balloon was navigated into the recently deployed stent. Angioplasty was performed under fluoroscopy. Left internal carotid artery angiograms with frontal and lateral views of the neck showed adequate stent positioning and expansion with brisk anterograde flow. Left internal carotid artery angiograms with frontal and lateral views of the head showed improvement of anterograde flow in the left MCA vascular tree with brisk anterograde flow. Delayed left common carotid artery angiograms with frontal and lateral views of the neck showed no evidence of clot formation within the stent. The catheter was subsequently Riddle. Right common femoral artery angiogram was obtained in right anterior oblique view. The puncture is at the level of the common femoral artery. The artery has normal caliber, adequate for closure device. The sheath was exchanged over the wire for an 8 [place] Angio-Seal which was utilized for access closure. Immediate hemostasis was achieved. IMPRESSION: 1. Successful mechanical thrombectomy for treatment of a proximal left M2/MCA anterior division branch occlusion achieving complete recanalization (TICI 3). 2. Atherosclerotic disease of the  left carotid  bifurcation with stenosis and clot formation suggesting acute plaque rupture treated with stenting and angioplasty with resolution of stenosis. PLAN: Continue cangrelor infusion until patient is transitioned to oral dual antiplatelet therapy. Electronically Signed   By: Baldemar Lenis M.D.   On: 06/05/2023 13:16   IR US Guide Vasc Access Right Result Date: 06/05/2023 INDICATION: 82 year old male presenting with right-sided weakness and aphasia; NIHSS 28. His last known well was 10 p.m. on 06/04/2023. His past medical history significant for prior stroke, hypertension, diabetes and chronic kidney disease; baseline modified Rankin scale 0. Head CT showed hypodensity within the left insula, basal ganglia and left frontal operculum (ASPECTS 7). No IV thrombolytic given as patient was outside the window. CT angiogram of the head and neck showed an occlusion of a left M2/MCA anterior division branch. CT perfusion showed a 41 mL core infarct with a 45 mL ischemic penumbra. She was transferred to our service for mechanical thrombectomy. EXAM: ULTRASOUND-GUIDED VASCULAR ACCESS DIAGNOSTIC CEREBRAL ANGIOGRAM MECHANICAL THROMBECTOMY FLAT PANEL HEAD CT LEFT CAROTID STENTING AND ANGIOPLASTY WITHOUT CEREBRAL PROTECTION DEVICE COMPARISON:  CT/CT angiogram of the head and neck June 05, 2023. MEDICATIONS: No antibiotics administered. ANESTHESIA/SEDATION: The procedure was performed under general anesthesia. CONTRAST:  80 mL of Omnipaque 300 milligram/mL FLUOROSCOPY: Radiation Exposure Index (as provided by the fluoroscopic device): 1315 mGy Kerma COMPLICATIONS: None immediate. TECHNIQUE: Informed written consent was obtained from the patient's wife after a thorough discussion of the procedural risks, benefits and alternatives. All questions were addressed. Maximal Sterile Barrier Technique was utilized including caps, mask, sterile gowns, sterile gloves, sterile drape, hand hygiene and skin antiseptic. A timeout  was performed prior to the initiation of the procedure. The right groin was prepped and draped in the usual sterile fashion. Using a micropuncture kit and the modified Seldinger technique, access was gained to the right common femoral artery and an 8 French sheath was placed. Real-time ultrasound guidance was utilized for vascular access including the acquisition of a permanent ultrasound image documenting patency of the accessed vessel. Under fluoroscopy, an 8 [place] Walrus balloon guide catheter was navigated over a 6 [place] VTK catheter and a 0.035" Terumo Glidewire into the aortic arch. The catheter was placed into the left common carotid artery and then advanced into the left internal carotid artery. The diagnostic catheter was removed. Frontal and lateral angiograms of the head were obtained. FINDINGS: 1. Ultrasound showed heavily calcified right common femoral artery is maintained patency and caliber. 2. Proximal occlusion of a left M2/MCA anterior division branch. 3. Atherosclerotic changes of the intracranial left ICA with mild stenosis at the distal cavernous segment. 4. 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 PROCEDURE: Using biplane roadmap guidance, a Red 62 aspiration catheter was navigated over Colossus 35 microguidewire into the cavernous segment of the left ICA. The aspiration catheter was then advanced to the level of occlusion and connected to an aspiration pump. Continuous aspiration was performed for 2 minutes. The guide catheter was connected to a VacLok syringe and the guiding catheter balloon was inflated. The aspiration catheter was subsequently removed under constant aspiration. The guide catheter was aspirated for debris. Left internal carotid artery angiograms with frontal and lateral views of the head showed complete recanalization of the left MCA vascular tree. The guide catheter  was retracted into the neck. Frontal and lateral angiograms of the neck were obtained. Improvement of the  degree of stenosis in the carotid bulb compared to prior CT angiogram. There is prominent luminal irregularity at the carotid bulb residual moderate stenosis. Increased tortuosity of the proximal/mid cervical left ICA with kinking. Left internal carotid artery angiograms with left anterior oblique views of the neck showed evidence of filling defects at the level of the carotid bulb. Flat panel CT of the head was obtained and post processed in a separate workstation with concurrent attending physician supervision. Selected images were sent to PACS. No evidence of hemorrhagic complication. There is mild contrast staining of the left insular and frontal cortex. Repeat left internal carotid artery angiograms with frontal and lateral views of the head showed Amy seen left M3/MCA branch to the left parietal region. Left common carotid artery angiograms with frontal and lateral views of the neck showed vertebra Gretchen of stenosis at the left ICA bulb with more prominent filling defect. At this point, patient was loaded on cangrelor followed by continuous drip. Using biplane roadmap guidance, a 4-7 mm Emboshield NAV6 cerebral protection device was advanced into the cervical left ICA. However, multiple attempts to advance a cerebral protection device through the left ICA kinking proved unsuccessful. The cerebral protection device was subsequently removed. Using biplane roadmap guidance, a 10-8 x 40 mm XACT carotid stent was navigated and deployed from the distal left common carotid artery to the proximal left internal carotid artery, proximal to the vessel kinking. Suboptimal stent expansion was noted. Then, a 6 x 30 mm Viatrac balloon was navigated into the recently deployed stent. Angioplasty was performed under fluoroscopy. Left internal carotid artery angiograms with frontal and lateral views of the neck showed  adequate stent positioning and expansion with brisk anterograde flow. Left internal carotid artery angiograms with frontal and lateral views of the head showed improvement of anterograde flow in the left MCA vascular tree with brisk anterograde flow. Delayed left common carotid artery angiograms with frontal and lateral views of the neck showed no evidence of clot formation within the stent. The catheter was subsequently Riddle. Right common femoral artery angiogram was obtained in right anterior oblique view. The puncture is at the level of the common femoral artery. The artery has normal caliber, adequate for closure device. The sheath was exchanged over the wire for an 8 [place] Angio-Seal which was utilized for access closure. Immediate hemostasis was achieved. IMPRESSION: 1. Successful mechanical thrombectomy for treatment of a proximal left M2/MCA anterior division branch occlusion achieving complete recanalization (TICI 3). 2. 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 PLAN: Continue cangrelor infusion until patient is transitioned to oral dual antiplatelet therapy. Electronically Signed   By: Baldemar Lenis M.D.   On: 06/05/2023 13:16   IR INTRAVSC STENT CERV CAROTID W/O EMB-PROT MOD SED Result Date: 06/05/2023 INDICATION: 83 year old male presenting with right-sided weakness and aphasia; NIHSS 28. His last known well was 10 p.m. on 06/04/2023. His past medical history significant for prior stroke, hypertension, diabetes and chronic kidney disease; baseline modified Rankin scale 0. Head CT showed hypodensity within the left insula, basal ganglia and left frontal operculum (ASPECTS 7). No IV thrombolytic given as patient was outside the window. CT angiogram of the head and neck showed an occlusion of a left M2/MCA anterior division branch. CT  perfusion showed a 41 mL core infarct with a 45 mL ischemic penumbra. She was transferred to our service for mechanical thrombectomy. EXAM:  ULTRASOUND-GUIDED VASCULAR ACCESS DIAGNOSTIC CEREBRAL ANGIOGRAM MECHANICAL THROMBECTOMY FLAT PANEL HEAD CT LEFT CAROTID STENTING AND ANGIOPLASTY WITHOUT CEREBRAL PROTECTION DEVICE COMPARISON:  CT/CT angiogram of the head and neck June 05, 2023. MEDICATIONS: No antibiotics administered. ANESTHESIA/SEDATION: The procedure was performed under general anesthesia. CONTRAST:  80 mL of Omnipaque 300 milligram/mL FLUOROSCOPY: Radiation Exposure Index (as provided by the fluoroscopic device): 1315 mGy Kerma COMPLICATIONS: None immediate. TECHNIQUE: Informed written consent was obtained from the patient's wife after a thorough discussion of the procedural risks, benefits and alternatives. All questions were addressed. Maximal Sterile Barrier Technique was utilized including caps, mask, sterile gowns, sterile gloves, sterile drape, hand hygiene and skin antiseptic. A timeout was performed prior to the initiation of the procedure. The right groin was prepped and draped in the usual sterile fashion. Using a micropuncture kit and the modified Seldinger technique, access was gained to the right common femoral artery and an 8 French sheath was placed. Real-time ultrasound guidance was utilized for vascular access including the acquisition of a permanent ultrasound image documenting patency of the accessed vessel. Under fluoroscopy, an 8 [place] Walrus balloon guide catheter was navigated over a 6 [place] VTK catheter and a 0.035" Terumo Glidewire into the aortic arch. The catheter was placed into the left common carotid artery and then advanced into the left internal carotid artery. The diagnostic catheter was removed. Frontal and lateral angiograms of the head were obtained. FINDINGS: 1. Ultrasound showed heavily calcified right common femoral artery is maintained patency and caliber. 2.  Proximal occlusion of a left M2/MCA anterior division branch. 3. Atherosclerotic changes of the intracranial left ICA with mild stenosis at the distal cavernous segment. 4. 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 PROCEDURE: Using biplane roadmap guidance, a Red 62 aspiration catheter was navigated over Colossus 35 microguidewire into the cavernous segment of the left ICA. The aspiration catheter was then advanced to the level of occlusion and connected to an aspiration pump. Continuous aspiration was performed for 2 minutes. The guide catheter was connected to a VacLok syringe and the guiding catheter balloon was inflated. The aspiration catheter was subsequently removed under constant aspiration. The guide catheter was aspirated for debris. Left internal carotid artery angiograms with frontal and lateral views of the head showed complete recanalization of the left MCA vascular tree. The guide catheter was retracted into the neck. Frontal and lateral angiograms of the neck were obtained. Improvement of the degree of stenosis in the carotid bulb compared to prior CT angiogram. There is prominent luminal irregularity at the carotid bulb residual moderate stenosis. Increased tortuosity of the proximal/mid cervical left ICA with kinking. Left internal carotid artery angiograms with left anterior oblique views of the neck showed evidence of filling defects at the level of the carotid bulb. Flat panel CT of the head was obtained and post processed in a separate workstation with concurrent attending physician supervision. Selected images were sent to PACS. No evidence of hemorrhagic complication. There is mild contrast staining of the left insular and frontal cortex. Repeat left internal carotid artery angiograms with frontal and lateral views of the head showed Amy seen left M3/MCA branch to the left parietal  region. Left common carotid artery angiograms with frontal and lateral views of the neck showed vertebra Gretchen of stenosis at the left ICA bulb with more prominent filling defect. At this point, patient was loaded on cangrelor followed by continuous drip. Using  biplane roadmap guidance, a 4-7 mm Emboshield NAV6 cerebral protection device was advanced into the cervical left ICA. However, multiple attempts to advance a cerebral protection device through the left ICA kinking proved unsuccessful. The cerebral protection device was subsequently removed. Using biplane roadmap guidance, a 10-8 x 40 mm XACT carotid stent was navigated and deployed from the distal left common carotid artery to the proximal left internal carotid artery, proximal to the vessel kinking. Suboptimal stent expansion was noted. Then, a 6 x 30 mm Viatrac balloon was navigated into the recently deployed stent. Angioplasty was performed under fluoroscopy. Left internal carotid artery angiograms with frontal and lateral views of the neck showed adequate stent positioning and expansion with brisk anterograde flow. Left internal carotid artery angiograms with frontal and lateral views of the head showed improvement of anterograde flow in the left MCA vascular tree with brisk anterograde flow. Delayed left common carotid artery angiograms with frontal and lateral views of the neck showed no evidence of clot formation within the stent. The catheter was subsequently Riddle. Right common femoral artery angiogram was obtained in right anterior oblique view. The puncture is at the level of the common femoral artery. The artery has normal caliber, adequate for closure device. The sheath was exchanged over the wire for an 8 [place] Angio-Seal which was utilized for access closure. Immediate hemostasis was achieved. IMPRESSION: 1. Successful mechanical thrombectomy for treatment of a proximal left M2/MCA anterior division branch occlusion achieving complete  recanalization (TICI 3). 2. 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 PLAN: Continue cangrelor infusion until patient is transitioned to oral dual antiplatelet therapy. Electronically Signed   By: Baldemar Lenis M.D.   On: 06/05/2023 13:16   IR CT Head Ltd Result Date: 06/05/2023 INDICATION: 83 year old male presenting with right-sided weakness and aphasia; NIHSS 28. His last known well was 10 p.m. on 06/04/2023. His past medical history significant for prior stroke, hypertension, diabetes and chronic kidney disease; baseline modified Rankin scale 0. Head CT showed hypodensity within the left insula, basal ganglia and left frontal operculum (ASPECTS 7). No IV thrombolytic given as patient was outside the window. CT angiogram of the head and neck showed an occlusion of a left M2/MCA anterior division branch. CT perfusion showed a 41 mL core infarct with a 45 mL ischemic penumbra. She was transferred to our service for mechanical thrombectomy. EXAM: ULTRASOUND-GUIDED VASCULAR ACCESS DIAGNOSTIC CEREBRAL ANGIOGRAM MECHANICAL THROMBECTOMY FLAT PANEL HEAD CT LEFT CAROTID STENTING AND ANGIOPLASTY WITHOUT CEREBRAL PROTECTION DEVICE COMPARISON:  CT/CT angiogram of the head and neck June 05, 2023. MEDICATIONS: No antibiotics administered. ANESTHESIA/SEDATION: The procedure was performed under general anesthesia. CONTRAST:  80 mL of Omnipaque 300 milligram/mL FLUOROSCOPY: Radiation Exposure Index (as provided by the fluoroscopic device): 1315 mGy Kerma COMPLICATIONS: None immediate. TECHNIQUE: Informed written consent was obtained from the patient's wife after a thorough discussion of the procedural risks, benefits and alternatives. All questions were addressed. Maximal Sterile Barrier Technique was utilized including caps, mask, sterile gowns, sterile gloves,  sterile drape, hand hygiene and skin antiseptic. A timeout was performed prior to the initiation of the procedure. The right groin was prepped and draped in the usual sterile fashion. Using a micropuncture kit and the modified Seldinger technique, access was gained to the right common femoral artery and an 8 French sheath was placed. Real-time ultrasound guidance was utilized for vascular access including the acquisition  of a permanent ultrasound image documenting patency of the accessed vessel. Under fluoroscopy, an 8 [place] Walrus balloon guide catheter was navigated over a 6 [place] VTK catheter and a 0.035" Terumo Glidewire into the aortic arch. The catheter was placed into the left common carotid artery and then advanced into the left internal carotid artery. The diagnostic catheter was removed. Frontal and lateral angiograms of the head were obtained. FINDINGS: 1. Ultrasound showed heavily calcified right common femoral artery is maintained patency and caliber. 2. Proximal occlusion of a left M2/MCA anterior division branch. 3. Atherosclerotic changes of the intracranial left ICA with mild stenosis at the distal cavernous segment. 4. 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 PROCEDURE: Using biplane roadmap guidance, a Red 62 aspiration catheter was navigated over Colossus 35 microguidewire into the cavernous segment of the left ICA. The aspiration catheter was then advanced to the level of occlusion and connected to an aspiration pump. Continuous aspiration was performed for 2 minutes. The guide catheter was connected to a VacLok syringe and the guiding catheter balloon was inflated. The aspiration catheter was subsequently removed under constant aspiration. The guide catheter was aspirated for debris. Left internal carotid artery angiograms with frontal and lateral views of the head showed complete  recanalization of the left MCA vascular tree. The guide catheter was retracted into the neck. Frontal and lateral angiograms of the neck were obtained. Improvement of the degree of stenosis in the carotid bulb compared to prior CT angiogram. There is prominent luminal irregularity at the carotid bulb residual moderate stenosis. Increased tortuosity of the proximal/mid cervical left ICA with kinking. Left internal carotid artery angiograms with left anterior oblique views of the neck showed evidence of filling defects at the level of the carotid bulb. Flat panel CT of the head was obtained and post processed in a separate workstation with concurrent attending physician supervision. Selected images were sent to PACS. No evidence of hemorrhagic complication. There is mild contrast staining of the left insular and frontal cortex. Repeat left internal carotid artery angiograms with frontal and lateral views of the head showed Amy seen left M3/MCA branch to the left parietal region. Left common carotid artery angiograms with frontal and lateral views of the neck showed vertebra Gretchen of stenosis at the left ICA bulb with more prominent filling defect. At this point, patient was loaded on cangrelor followed by continuous drip. Using biplane roadmap guidance, a 4-7 mm Emboshield NAV6 cerebral protection device was advanced into the cervical left ICA. However, multiple attempts to advance a cerebral protection device through the left ICA kinking proved unsuccessful. The cerebral protection device was subsequently removed. Using biplane roadmap guidance, a 10-8 x 40 mm XACT carotid stent was navigated and deployed from the distal left common carotid artery to the proximal left internal carotid artery, proximal to the vessel kinking. Suboptimal stent expansion was noted. Then, a 6 x 30 mm Viatrac balloon was navigated into the recently deployed stent. Angioplasty was performed under fluoroscopy. Left internal carotid artery  angiograms with frontal and lateral views of the neck showed adequate stent positioning and expansion with brisk anterograde flow. Left internal carotid artery angiograms with frontal and lateral views of the head showed improvement of anterograde flow in the left MCA vascular tree with brisk anterograde flow. Delayed left common carotid artery angiograms with frontal and lateral views of the neck showed no evidence of clot formation within  the stent. The catheter was subsequently Riddle. Right common femoral artery angiogram was obtained in right anterior oblique view. The puncture is at the level of the common femoral artery. The artery has normal caliber, adequate for closure device. The sheath was exchanged over the wire for an 8 [place] Angio-Seal which was utilized for access closure. Immediate hemostasis was achieved. IMPRESSION: 1. Successful mechanical thrombectomy for treatment of a proximal left M2/MCA anterior division branch occlusion achieving complete recanalization (TICI 3). 2. 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 PLAN: Continue cangrelor infusion until patient is transitioned to oral dual antiplatelet therapy. Electronically Signed   By: Baldemar Lenis M.D.   On: 06/05/2023 13:16   CT ANGIO HEAD NECK W WO CM W PERF (CODE STROKE) Addendum Date: 06/05/2023 ADDENDUM REPORT: 06/05/2023 12:25 ADDENDUM: Please note, there is a dictation error within CTA neck impression #1, which should read: The common carotid and internal carotid arteries are patent within the neck. Atherosclerotic plaque bilaterally. Most notably, there is progressive atherosclerotic plaque about the left carotid bifurcation and within the proximal left ICA with resultant severe near occlusive stenosis of the proximal left ICA. Also of note, atherosclerotic plaque about  the right carotid bifurcation results in a 40% stenosis at the right ICA origin. Electronically Signed   By: Jackey Loge D.O.   On: 06/05/2023 12:25   Result Date: 06/05/2023 CLINICAL DATA:  Provided history: Cerebrovascular accident, unspecified mechanism. Right-sided weakness. Right-sided facial droop. Altered mental status. EXAM: CT ANGIOGRAPHY HEAD AND NECK CT PERFUSION BRAIN TECHNIQUE: Multidetector CT imaging of the head and neck was performed using the standard protocol during bolus administration of intravenous contrast. Multiplanar CT image reconstructions and MIPs were obtained to evaluate the vascular anatomy. Carotid stenosis measurements (when applicable) are obtained utilizing NASCET criteria, using the distal internal carotid diameter as the denominator. Multiphase CT imaging of the brain was performed following IV bolus contrast injection. Subsequent parametric perfusion maps were calculated using RAPID software. RADIATION DOSE REDUCTION: This exam was performed according to the departmental dose-optimization program which includes automated exposure control, adjustment of the mA and/or kV according to patient size and/or use of iterative reconstruction technique. CONTRAST:  OMNIPAQUE IOHEXOL 350 MG/ML SOLN COMPARISON:  Noncontrast head CT performed earlier today 06/05/2023. MRA head and MRA neck 10/04/2019. FINDINGS: CTA NECK FINDINGS Aortic arch: Common origin of the innominate and left common carotid arteries. Atherosclerotic plaque within the visualized thoracic aorta and proximal major branch vessels of the neck. Streak/beam hardening artifact arising from a dense right-sided contrast bolus partially obscures the right subclavian artery. Within this limitation, there is no appreciable hemodynamically significant innominate or proximal subclavian artery stenosis. Right carotid system: CCA and ICA patent within the neck. Atherosclerotic plaque, greatest about the carotid bifurcation.  Resultant 40% stenosis at the ICA origin. Left carotid system: CCA and ICA patent within the neck. Atherosclerotic plaque. Most notably, there is prominent atherosclerotic plaque about the carotid bifurcation and within the proximal ICA which has progressed from the prior MRA neck of 10/04/2019. Resultant severe (near occlusive) stenosis of the proximal ICA. Tortuosity of the cervical ICA Vertebral arteries: The vertebral arteries are patent within the neck. Streak/beam hardening artifact limits evaluation of the right vertebral artery origin. At least moderate stenosis is suspected at this site. Atherosclerotic plaque scattered elsewhere within the cervical right vertebral artery with no more than mild stenosis.  Calcified atherosclerotic plaque at the left vertebral artery origin with suspected at least moderate stenosis. Nonstenotic calcified plaque elsewhere within the cervical left vertebral artery. Skeleton: Cervical spondylosis. Other neck: No neck mass or cervical lymphadenopathy. Upper chest: No consolidation within the imaged lung apices. Review of the MIP images confirms the above findings CTA HEAD FINDINGS Anterior circulation: The intracranial internal carotid arteries are patent. As sclerotic plaque within both vessels. No more than mild stenosis on the right. Up to moderate stenosis within the left cavernous segment. The M1 middle cerebral arteries are patent. Abrupt occlusion of a proximal M2 left middle cerebral artery vessel (series 11, image 22). Atherosclerotic irregularity of the M2 and more distal MCA vessels elsewhere. The anterior cerebral arteries are patent. Atherosclerotic irregularity of both vessels without high-grade proximal stenosis. A possible 2 mm periophthalmic left ICA aneurysm with better appreciated on the prior MRA head of 10/04/2019. Posterior circulation: The intracranial vertebral arteries are patent. Atherosclerotic plaque within the right V4 segment sites of mild stenosis.  Non-stenotic atherosclerotic plaque within the left V4 segment. The basilar artery is patent. The posterior cerebral arteries are patent. Posterior communicating arteries are diminutive or absent, bilaterally. Venous sinuses: Assessment for dural venous sinus thrombosis is limited due to contrast timing. Anatomic variants: As described. Review of the MIP images confirms the above findings CT Brain Perfusion Findings: ASPECTS: CBF (<30%) Volume: 41mL Perfusion (Tmax>6.0s) volume: 86mL Mismatch Volume: 45mL Infarction Location:Left MCA vascular territory CTA head impression #1, the CT perfusion head impression and the presence of a severe stenosis of the proximal cervical left ICA called by telephone at the time of interpretation on 06/05/2023 at 8:40 am to provider [facility] , who verbally acknowledged these results. IMPRESSION: CTA neck: 1. No common carotid and internal carotid arteries are patent within the neck. Atherosclerotic plaque bilaterally. Most notably, there is progressive atherosclerotic plaque about the left carotid bifurcation and within the proximal left ICA with resultant severe, near occlusive stenosis of the proximal left ICA. Also of note, atherosclerotic plaque about the right carotid bifurcation results in 40% stenosis at the right ICA origin. 2. The vertebral arteries are patent within the neck. Atherosclerotic plaque bilaterally as described. Most notably, there is suspected at least moderate stenoses at the bilateral vertebral artery origins. 3. Aortic Atherosclerosis (ICD10-I70.0). CTA head: 1. Abrupt occlusion of a proximal M2 left middle cerebral artery vessel. 2. Background intracranial atherosclerotic disease as described. 3. A possible 2 mm periophthalmic left ICA aneurysm was better appreciated on the prior MRA head of 10/04/2019. CT perfusion head: The perfusion software identifies a 41 mL core infarct in the left MCA vascular territory. The perfusion software identifies an 86 mL  region of critically hypoperfused parenchyma within the left MCA vascular territory (utilizing the Tmax>6 seconds threshold). Reported mismatch volume: 45 mL Electronically Signed: By: Jackey Loge D.O. On: 06/05/2023 09:07   CT HEAD CODE STROKE WO CONTRAST Result Date: 06/05/2023 CLINICAL DATA:  Code stroke. Neuro deficit, acute, stroke suspected. EXAM: CT HEAD WITHOUT CONTRAST TECHNIQUE: Contiguous axial images were obtained from the base of the skull through the vertex without intravenous contrast. RADIATION DOSE REDUCTION: This exam was performed according to the departmental dose-optimization program which includes automated exposure control, adjustment of the mA and/or kV according to patient size and/or use of iterative reconstruction technique. COMPARISON:  Brain MRI 10/04/2019.  Noncontrast head CT 10/03/2019. FINDINGS: Brain: Generalized cerebral atrophy. Loss of gray-white differentiation consistent with an acute infarct within the left insula and within  portions of the left frontal operculum (MCA vascular territory). Known small chronic cortically-based infarcts within the left frontal, left parietal and left occipital lobes were better appreciated on the prior brain MRI of 10/04/2019 (acute at that time). Mild patchy and ill-defined hypoattenuation within the cerebral white matter, nonspecific but compatible with chronic small vessel ischemic disease. Subcentimeter infarct within the superior right cerebellar hemisphere, new from the prior MRI but chronic in appearance. Loss of gray-white differentiation there is no acute intracranial hemorrhage. No extra-axial fluid collection. No evidence of an intracranial mass. No midline shift. Vascular: No hyperdense vessel.  Atherosclerotic calcifications. Skull: No calvarial fracture or aggressive osseous lesion. Sinuses/Orbits: No mass or acute finding within the imaged orbits. No significant paranasal sinus disease. ASPECTS [facility] Stroke Program Early CT  Score) - Ganglionic level infarction (caudate, lentiform nuclei, internal capsule, insula, M1-M3 cortex): 5 - Supraganglionic infarction (M4-M6 cortex): 2 Total score (0-10 with 10 being normal): 7 Impression #1 called by telephone at the time of interpretation on 06/05/2023 at 8:40 am to provider Dr. Otelia Limes, who verbally acknowledged these results. IMPRESSION: 1. Acute left MCA territory infarct affecting the left insula and portions of the left frontal operculum. ASPECTS is 7. 2. Known small chronic cortically-based infarcts within the left frontal, left parietal and left occipital lobes were better appreciated on the prior brain MRI of 10/04/2019 (acute at that time). 3. Background mild cerebral white matter chronic small vessel ischemic disease. 4. Subcentimeter infarct within the right cerebellar hemisphere, new from prior MRI but chronic in appearance. 5. Generalized cerebral atrophy. Electronically Signed   By: Jackey Loge D.O.   On: 06/05/2023 08:45     PHYSICAL EXAM  Temp:  [97.2 F (36.2 C)-99.5 F (37.5 C)] 98 F (36.7 C) (02/14 1215) Pulse Rate:  [95-110] 110 (02/14 1215) Resp:  [16-20] 16 (02/14 1215) BP: (76-164)/(53-98) 121/62 (02/14 1251) SpO2:  [94 %-100 %] 100 % (02/14 1251) Weight:  [122.5 kg] 122.5 kg (02/14 0414)  General -well-nourished, well-developed elderly patient in no acute distress Sitting up in chair  Cardiovascular - Regular rhythm and rate.  Neuro - awake, alert, eyes open, orientated to age, place only.    Severe dysarthria, but following following commands.   No gaze palsy, tracking bilaterally, blinking to visual threat bilaterally.  Right facial droop. Tongue midline Bilateral UEs 4/5, minimal right drift.  Bilaterally LEs 4/5.  Sensation symmetric to light touch.  gait not tested.    ASSESSMENT/PLAN Mr. Jim Lundin. is a 83 y.o. male with history of hypertension, diabetes, CKD 3, stroke admitted for right-sided weakness numbness,  aphasia, right facial droop, left gaze preference. No TNK given due to outside window.  Patient underwent mechanical thrombectomy with left ICA stenting.  He is now awaiting placement at a SNF for rehab.  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 status post IR with TICI3 and confluent HT, likely secondary to large vessel disease source versus cardiomyopathy with low EF CT left MCA infarct CT head and neck left M2 occlusion, left ICA near occlusion, right ICA 43 stenosis, bilateral VA origin severe stenosis CTP 41/86 Status post IR with TICI3 and left ICA stenting MRI left MCA infarct at left insular and left frontal operculum, prominent and confluent petechial hemorrhagic transformation CT repeat 2/7 stable confluent petechial hemorrhage 2D Echo EF 30% LDL 107 HgbA1c 6.1 UDS negative Heparin subcu for VTE prophylaxis aspirin 81 mg daily and clopidogrel 75 mg daily prior to  admission, now on aspirin and Plavix post stenting. Patient will be counseled to be compliant with his antithrombotic medications Ongoing aggressive stroke risk factor management Therapy recommendations:  SNF Disposition: Pending  History of stroke 10/08/2019 admitted for left MCA infarct due to right upper extremity weakness.  MRA head and neck showed left ICA 30% stenosis.  EF 45 to 50%.  LDL 105, A1c 5.6.  Recommended loop recorder at that time but only got 30-day CardioNet monitoring which was no A-fib.  Discharged on DAPT and Lipitor 80.  Cardiomyopathy CHF 09/2019 EF 45 to 50% Current admission EF 30% Cardiology on board, appreciate Dr. Rennis Golden assistance On DAPT Agree with GDMT - would like to start losartan, entresto, and spironolactone as BP and Cr tolerates Metoprolol succinate 25mg  daily Lasix 80mg  x1 May consider loop recorder placement to rule out A-fib  Diabetes HgbA1c 6.1 goal < 7.0 Controlled CBG monitoring SSI.  Add Semglee 3 units daily and continue to monitor DM  education and close PCP follow up  Hypertension Stable BP goal less than 160 given hemorrhagic transformation Long term BP goal normotensive  Hyperlipidemia Home meds: Lipitor 80 LDL 105, goal < 70 Now on lipitor 80 and Zetia Continue statin and at discharge  Dysphagia Poststroke dysphagia Speech on board Dysphagia 1 (Pureed) honey thick liquid. On core track-will DC core track as he is eating 100% of meals.  DC tube feeds.  Other Stroke Risk Factors Advanced age Obesity, Body mass index is 38.75 kg/m.   Other Active Problems AKI on CKD3b, creatinine 1.60--1.66 -> 1.98>1.87-> 1.81 Hemoglobin 9.3 >9.7-> 10.0 Presyncopal episode with mild hypotension 2/14-suspect was due to straining to have a bowel movement, will prescribe as needed suppository  Hospital day # 9  Cortney E Ernestina Columbia ,   [facility] See Amion for schedule and pager information 06/14/2023 1:37 PM   I have personally obtained history,examined this patient, reviewed notes, independently viewed imaging studies, participated in medical decision making and plan of care.ROS completed by me personally and pertinent positives fully documented  I have made any additions or clarifications directly to the above note. Agree with note above.  Patient medically stable to be transferred to rehabilitation skilled nursing facility when bed available.  Long discussion with patient and family member at the bedside and answered questions.  Greater than 50% time during this 35-minute visit was spent in counseling and coordination of care and discussion with patient and family and care team and answering questions  Delia Heady, MD Medical Director [facility] Pager: [phone] 06/14/2023 3:24 PM

## 2023-06-14 NOTE — Progress Notes (Signed)
Physical Therapy Treatment Patient Details Name: Nathan Proctor. MRN: [id number] DOB: 06/20/1940 Today's Date: 06/14/2023   History of Present Illness 83 y.o. male who was brought in by EMS 06/05/23 as a code stroke due to acute onset of right-sided weakness, right facial droop and aphasia. CTH shows acute left MCA territory infarct; NIHSS 28; to IR for thrombectomy/stenting/angioplasty; PMH hypertension, diabetes, CKD, prior CVA with no residual deficits    PT Comments  Pt seen for PT tx with pt agreeable, daughter present in room but exiting mid way through. Pt pleasant throughout session, taking extra time to follow one step commands. Pt is able to complete supine>sit to exit L side of bed with CGA & extra time to complete. Pt noted to have incontinent BM, pt unaware (reports bowel incontinence is baseline & he uses depends at home). Pt assisted to [facility] to have additional BM. PT attempted to assist pt off BSC multiple times but each time pt stood for PT to perform peri hygiene pt had to sit back down to continue going. PT left on [facility] in care of nurse. HR 110-122 bpm during session. Will continue to follow pt acutely to progress mobility as able.   If plan is discharge home, recommend the following: Direct supervision/assist for medications management;Direct supervision/assist for financial management;Assist for transportation;Help with stairs or ramp for entrance;Supervision due to cognitive status;A lot of help with walking and/or transfers;A lot of help with bathing/dressing/bathroom   Can travel by private vehicle     No  Equipment Recommendations  Other (comment) (defer to next venue)    Recommendations for Other Services Rehab consult     Precautions / Restrictions Precautions Precautions: Fall Precaution/Restrictions Comments: incontinent bowel/bladder; SBP 120-160 Restrictions Weight Bearing Restrictions Per Provider Order: No     Mobility  Bed Mobility Overal bed  mobility: Needs Assistance       Supine to sit: Contact guard, Used rails, HOB elevated (extra time, pt exits L side of bed)          Transfers Overall transfer level: Needs assistance Equipment used: Rolling walker (2 wheels) Transfers: Sit to/from Stand Sit to Stand: Mod assist, Min assist   Step pivot transfers: Mod assist, Min assist (bed>BSC on R, cuing for sequencing)       General transfer comment: mod fade to min assist, cuing re: hand placement to push to standing    Ambulation/Gait                   Stairs             Wheelchair Mobility     Tilt Bed    Modified Rankin (Stroke Patients Only)       Balance Overall balance assessment: Needs assistance Sitting-balance support: Feet supported Sitting balance-Leahy Scale: Fair Sitting balance - Comments: supervision static sitting on BSC                                    Communication    Cognition Arousal: Alert Behavior During Therapy: Flat affect                           PT - Cognition Comments: cognition WFL for session, not formally assessed   Following commands impaired: Only follows one step commands consistently, Follows one step commands with increased time    Cueing    Exercises  General Comments        Pertinent Vitals/Pain Pain Assessment Pain Assessment: No/denies pain    Home Living                          Prior Function            PT Goals (current goals can now be found in the care plan section) Acute Rehab PT Goals Patient Stated Goal: none stated PT Goal Formulation: With patient Time For Goal Achievement: 06/20/23 Potential to Achieve Goals: Fair Progress towards PT goals: PT to reassess next treatment    Frequency    Min 1X/week (per delivery of care model)      PT Plan      Co-evaluation              AM-PAC PT "6 Clicks" Mobility   Outcome Measure  Help needed turning from your back  to your side while in a flat bed without using bedrails?: A Little Help needed moving from lying on your back to sitting on the side of a flat bed without using bedrails?: A Lot Help needed moving to and from a bed to a chair (including a wheelchair)?: A Lot Help needed standing up from a chair using your arms (e.g., wheelchair or bedside chair)?: A Lot Help needed to walk in hospital room?: A Lot Help needed climbing 3-5 steps with a railing? : Total 6 Click Score: 12    End of Session Equipment Utilized During Treatment: Gait belt Activity Tolerance: Patient tolerated treatment well Patient left:  (on BSC in care of nurse) Nurse Communication: Mobility status PT Visit Diagnosis: Other abnormalities of gait and mobility (R26.89);Muscle weakness (generalized) (M62.81)     Time: 1610-9604 PT Time Calculation (min) (ACUTE ONLY): 33 min  Charges:    $Therapeutic Activity: 23-37 mins PT General Charges $$ ACUTE PT VISIT: 1 Visit                     Nathan Proctor, PT, DPT 06/14/23, 11:55 AM   Nathan Proctor 06/14/2023, 11:52 AM

## 2023-06-14 NOTE — Progress Notes (Addendum)
Patient was in bedside commode for a while and he started leaning front, sweating, and passed out, CBG 213, SBP in 70s and went up to 80s, MD aware rapid response nurse at bedside, MD aware, patient  BP 98/77 now, will continue to monitor  1227:BP 108/68, HR 105

## 2023-06-14 NOTE — Plan of Care (Signed)
  Problem: Education: Goal: Knowledge of disease or condition will improve Outcome: Progressing   Problem: Ischemic Stroke/TIA Tissue Perfusion: Goal: Complications of ischemic stroke/TIA will be minimized Outcome: Progressing   Problem: Coping: Goal: Will verbalize positive feelings about self Outcome: Progressing   Problem: Health Behavior/Discharge Planning: Goal: Ability to manage health-related needs will improve Outcome: Progressing Goal: Goals will be collaboratively established with patient/family Outcome: Progressing   Problem: Self-Care: Goal: Ability to participate in self-care as condition permits will improve Outcome: Progressing Goal: Verbalization of feelings and concerns over difficulty with self-care will improve Outcome: Progressing Goal: Ability to communicate needs accurately will improve Outcome: Progressing   Problem: Nutrition: Goal: Risk of aspiration will decrease Outcome: Progressing Goal: Dietary intake will improve Outcome: Progressing   Problem: Clinical Measurements: Goal: Ability to maintain clinical measurements within normal limits will improve Outcome: Progressing   Problem: Metabolic: Goal: Ability to maintain appropriate glucose levels will improve Outcome: Progressing

## 2023-06-14 NOTE — Progress Notes (Signed)
OT Cancellation Note  Patient Details Name: Nathan Proctor. MRN: [id number] DOB: [birth year]   Cancelled Treatment:    Reason Eval/Treat Not Completed: Medical issues which prohibited therapy.  Pt with recent orthostatic hypotension while sitting on the toilet with rapid response call.  Will hold off on OT treatment at this time and check back as able.    Perrin Maltese, OTR/L Acute Rehabilitation Services  Office [phone] 06/14/2023

## 2023-06-14 NOTE — Progress Notes (Signed)
Nutrition Follow-up  DOCUMENTATION CODES:   Non-severe (moderate) malnutrition in context of chronic illness  INTERVENTION:   - Continue Magic Cup TID with meals, each supplement provides 290 kcal and 9 grams of protein  - Add renal MVI daily  - Encourage PO intake  NUTRITION DIAGNOSIS:   Moderate Malnutrition related to chronic illness as evidenced by severe muscle depletion, mild fat depletion, severe fat depletion.  Ongoing, being addressed via oral nutrition supplements and diet advancement  GOAL:   Patient will meet greater than or equal to 90% of their needs  Progressing  MONITOR:   TF tolerance, Weight trends, Labs, Diet advancement  REASON FOR ASSESSMENT:   Consult Enteral/tube feeding initiation and management  ASSESSMENT:   Pt admitted for acute ischemic stroke with right sides weakness, right facial droop, and aphagia. Pt with PMH of HTN, DM, CKD III, prior CVA w/ no residual deficits.  2/05 - s/p IR mechanical thrombectomy and carotid stent placement, Cortrak placed (tip gastric) 2/07 - s/p MBS with recommendation for dysphagia 1 diet with honey-thick liquids 2/09 - Cortrak and tube feeds discontinued 2/12 - repeat MBS with recommendation for dysphagia 2 diet with thin liquids  Noted pt with a presyncopal episode and mild hypotension this morning, suspected to be due to straining to have a BM.  Pt unavailable at time of RD visit. Meal completions since diet advancement have been good, averaging 80%. Pt currently with Magic Cup ordered TID with meal trays.  Noted weight down 10 kg since admission. Question accuracy of admission weight as it differs significantly compared to other weights obtained during admission. Will continue to monitor trends.  Admit weight: 132.6 kg - question accuracy Current weight: 122.5 kg  Meal Completion: 50-100% x last 8 documented meals (averaging 80%)  Medications reviewed and include: lasix 40 mg daily, SSI every 4  hours, semglee 3 units daily, spironolactone  Labs reviewed: sodium 146, BUN 45, creatinine 1.81, hemoglobin 10.0 CBG's: 127-233 x 24 hours  UOP: 1500 mL x 24 hours  Diet Order:   Diet Order             DIET DYS 2 Room service appropriate? No; Fluid consistency: Thin  Diet effective now                   EDUCATION NEEDS:   Education needs have been addressed  Skin:  Skin Assessment: Skin Integrity Issues: Stage II: L scrotum Incisions: R thigh  Last BM:  06/13/23 multiple type 6  Height:   Ht Readings from Last 1 Encounters:  06/13/23 5\' 10"  (1.778 m)    Weight:   Wt Readings from Last 1 Encounters:  06/14/23 122.5 kg    Ideal Body Weight:  68.2 kg  BMI:  Body mass index is 38.75 kg/m.  Estimated Nutritional Needs:   Kcal:  2300-2500  Protein:  115-130 grams  Fluid:  2.4 L    Mertie Clause, MS, RD, LDN Registered Dietitian II Please see AMiON for contact information.

## 2023-06-14 NOTE — TOC Progression Note (Addendum)
Transition of Care (TOC) - Progression Note    Patient Details  Name: Nathan Proctor. MRN: [id number] Date of Birth: [birth year]  Transition of [facility]) CM/SW Contact  Baldemar Lenis, [place] Phone Number: 06/14/2023, 10:50 AM  Clinical Narrative:   Patient has received insurance approval to admit to SNF. CSW spoke with spouse, Nathan Proctor, to provide update that [facility] is unable to accept out of network, but patient has been approved for [facility]. Nathan Proctor said she had spoken to the insurance person and was told to look at some other places in [place]. Family attempted to tour [facility] yesterday but could not get in. [facility] is out of network. CSW provided that information, and Nathan Proctor indicated understanding. Elvira asked to call and speak with her daughters about patient moving to [place] today, and will call CSW back once everyone's on the same page. CSW provided update to [facility]. CSW to follow.  UPDATE: CSW received call from spouse, Nathan Proctor, that they again confirmed that [facility] is in network, and they would really prefer the patient be in [place] if able. Family requesting CSW to contact [facility] and discuss. CSW contacted [facility], and they have recently gotten a Community education officer with patient's insurance. CSW sent referral, and [facility] can clinically offer a bed but unsure if they will have a bed available until Monday. CSW contacted spouse to provide update, she wants to wait until Monday to see if patient can admit to [facility] instead. CSW to follow.    Expected Discharge Plan: Skilled Nursing Facility Barriers to Discharge: Continued Medical Work up  Expected Discharge Plan and Services In-house Referral: Clinical Social Work   Post Acute Care Choice: Skilled Nursing Facility Living arrangements for the past 2 months: Single Family Home                                       Social Determinants of Health (SDOH) Interventions SDOH Screenings    Food Insecurity: Patient Unable To Answer (06/07/2023)  Housing: Patient Unable To Answer (06/07/2023)  Transportation Needs: Patient Unable To Answer (06/07/2023)  Utilities: Patient Unable To Answer (06/07/2023)  Depression (PHQ2-9): Low Risk  (12/03/2019)  Social Connections: Patient Unable To Answer (06/07/2023)  Tobacco Use: Medium Risk (06/13/2023)    Readmission Risk Interventions     No data to display

## 2023-06-14 NOTE — Plan of Care (Signed)
  Problem: Coping: Goal: Will identify appropriate support needs Outcome: Progressing   Problem: Health Behavior/Discharge Planning: Goal: Goals will be collaboratively established with patient/family Outcome: Progressing   Problem: Nutrition: Goal: Risk of aspiration will decrease Outcome: Progressing Goal: Dietary intake will improve Outcome: Progressing   Problem: Coping: Goal: Level of anxiety will decrease Outcome: Progressing

## 2023-06-15 ENCOUNTER — Inpatient Hospital Stay (HOSPITAL_COMMUNITY): Payer: No Typology Code available for payment source

## 2023-06-15 DIAGNOSIS — J9601 Acute respiratory failure with hypoxia: Secondary | ICD-10-CM | POA: Diagnosis not present

## 2023-06-15 DIAGNOSIS — I5020 Unspecified systolic (congestive) heart failure: Secondary | ICD-10-CM

## 2023-06-15 DIAGNOSIS — R578 Other shock: Secondary | ICD-10-CM

## 2023-06-15 DIAGNOSIS — K920 Hematemesis: Secondary | ICD-10-CM | POA: Diagnosis not present

## 2023-06-15 DIAGNOSIS — I6389 Other cerebral infarction: Secondary | ICD-10-CM

## 2023-06-15 DIAGNOSIS — R579 Shock, unspecified: Secondary | ICD-10-CM | POA: Diagnosis not present

## 2023-06-15 DIAGNOSIS — I639 Cerebral infarction, unspecified: Secondary | ICD-10-CM | POA: Diagnosis not present

## 2023-06-15 DIAGNOSIS — E1165 Type 2 diabetes mellitus with hyperglycemia: Secondary | ICD-10-CM

## 2023-06-15 LAB — CBC
HCT: 34.4 % — ABNORMAL LOW (ref 39.0–52.0)
HCT: 29.7 % — ABNORMAL LOW (ref 39.0–52.0)
HCT: 30.2 % — ABNORMAL LOW (ref 39.0–52.0)
Hemoglobin: 11.2 g/dL — ABNORMAL LOW (ref 13.0–17.0)
Hemoglobin: 9.1 g/dL — ABNORMAL LOW (ref 13.0–17.0)
Hemoglobin: 9.3 g/dL — ABNORMAL LOW (ref 13.0–17.0)
MCH: 31.5 pg (ref 26.0–34.0)
MCH: 31.4 pg (ref 26.0–34.0)
MCH: 31.2 pg (ref 26.0–34.0)
MCHC: 32.6 g/dL (ref 30.0–36.0)
MCHC: 30.6 g/dL (ref 30.0–36.0)
MCHC: 30.8 g/dL (ref 30.0–36.0)
MCV: 96.9 fL (ref 80.0–100.0)
MCV: 102.4 fL — ABNORMAL HIGH (ref 80.0–100.0)
MCV: 101.3 fL — ABNORMAL HIGH (ref 80.0–100.0)
Platelets: 317 10*3/uL (ref 150–400)
Platelets: 233 10*3/uL (ref 150–400)
Platelets: 270 10*3/uL (ref 150–400)
RBC: 3.55 MIL/uL — ABNORMAL LOW (ref 4.22–5.81)
RBC: 2.9 MIL/uL — ABNORMAL LOW (ref 4.22–5.81)
RBC: 2.98 MIL/uL — ABNORMAL LOW (ref 4.22–5.81)
RDW: 13.6 % (ref 11.5–15.5)
RDW: 13.6 % (ref 11.5–15.5)
RDW: 14.4 % (ref 11.5–15.5)
WBC: 10.1 10*3/uL (ref 4.0–10.5)
WBC: 26.5 10*3/uL — ABNORMAL HIGH (ref 4.0–10.5)
WBC: 44.5 10*3/uL — ABNORMAL HIGH (ref 4.0–10.5)
nRBC: 0 % (ref 0.0–0.2)
nRBC: 0 % (ref 0.0–0.2)
nRBC: 0 % (ref 0.0–0.2)

## 2023-06-15 LAB — BASIC METABOLIC PANEL
Anion gap: 16 — ABNORMAL HIGH (ref 5–15)
Anion gap: 15 (ref 5–15)
BUN: 64 mg/dL — ABNORMAL HIGH (ref 8–23)
BUN: 69 mg/dL — ABNORMAL HIGH (ref 8–23)
CO2: 22 mmol/L (ref 22–32)
CO2: 21 mmol/L — ABNORMAL LOW (ref 22–32)
Calcium: 9.4 mg/dL (ref 8.9–10.3)
Calcium: 8.4 mg/dL — ABNORMAL LOW (ref 8.9–10.3)
Chloride: 105 mmol/L (ref 98–111)
Chloride: 107 mmol/L (ref 98–111)
Creatinine, Ser: 3.41 mg/dL — ABNORMAL HIGH (ref 0.61–1.24)
Creatinine, Ser: 4.35 mg/dL — ABNORMAL HIGH (ref 0.61–1.24)
GFR, Estimated: 17 mL/min — ABNORMAL LOW (ref >60)
GFR, Estimated: 13 mL/min — ABNORMAL LOW (ref >60)
Glucose, Bld: 351 mg/dL — ABNORMAL HIGH (ref 70–99)
Glucose, Bld: 169 mg/dL — ABNORMAL HIGH (ref 70–99)
Potassium: 5.1 mmol/L (ref 3.5–5.1)
Potassium: 5.3 mmol/L — ABNORMAL HIGH (ref 3.5–5.1)
Sodium: 143 mmol/L (ref 135–145)
Sodium: 143 mmol/L (ref 135–145)

## 2023-06-15 LAB — GLUCOSE, CAPILLARY
Glucose-Capillary: 149 mg/dL — ABNORMAL HIGH (ref 70–99)
Glucose-Capillary: 151 mg/dL — ABNORMAL HIGH (ref 70–99)
Glucose-Capillary: 157 mg/dL — ABNORMAL HIGH (ref 70–99)
Glucose-Capillary: 204 mg/dL — ABNORMAL HIGH (ref 70–99)
Glucose-Capillary: 208 mg/dL — ABNORMAL HIGH (ref 70–99)
Glucose-Capillary: 236 mg/dL — ABNORMAL HIGH (ref 70–99)
Glucose-Capillary: 282 mg/dL — ABNORMAL HIGH (ref 70–99)
Glucose-Capillary: 312 mg/dL — ABNORMAL HIGH (ref 70–99)
Glucose-Capillary: 319 mg/dL — ABNORMAL HIGH (ref 70–99)
Glucose-Capillary: 363 mg/dL — ABNORMAL HIGH (ref 70–99)
Glucose-Capillary: 373 mg/dL — ABNORMAL HIGH (ref 70–99)
Glucose-Capillary: 381 mg/dL — ABNORMAL HIGH (ref 70–99)

## 2023-06-15 LAB — POCT I-STAT 7, (LYTES, BLD GAS, ICA,H+H)
Acid-base deficit: 1 mmol/L (ref 0.0–2.0)
Bicarbonate: 26 mmol/L (ref 20.0–28.0)
Calcium, Ion: 1.24 mmol/L (ref 1.15–1.40)
HCT: 33 % — ABNORMAL LOW (ref 39.0–52.0)
Hemoglobin: 11.2 g/dL — ABNORMAL LOW (ref 13.0–17.0)
O2 Saturation: 99 %
Potassium: 5 mmol/L (ref 3.5–5.1)
Sodium: 141 mmol/L (ref 135–145)
TCO2: 28 mmol/L (ref 22–32)
pCO2 arterial: 51.8 mm[Hg] — ABNORMAL HIGH (ref 32–48)
pH, Arterial: 7.309 — ABNORMAL LOW (ref 7.35–7.45)
pO2, Arterial: 159 mm[Hg] — ABNORMAL HIGH (ref 83–108)

## 2023-06-15 LAB — ECHOCARDIOGRAM LIMITED
Height: 70 in
Weight: 4222.25 [oz_av]

## 2023-06-15 LAB — HEMOGLOBIN AND HEMATOCRIT, BLOOD
HCT: 27.1 % — ABNORMAL LOW (ref 39.0–52.0)
Hemoglobin: 8.4 g/dL — ABNORMAL LOW (ref 13.0–17.0)

## 2023-06-15 LAB — COOXEMETRY PANEL
Carboxyhemoglobin: 2.8 % — ABNORMAL HIGH (ref 0.5–1.5)
Methemoglobin: <0.7 % (ref 0.0–1.5)
O2 Saturation: 76.9 %
Total hemoglobin: 9.5 g/dL — ABNORMAL LOW (ref 12.0–16.0)

## 2023-06-15 LAB — PROCALCITONIN: Procalcitonin: 5.81 ng/mL

## 2023-06-15 LAB — PLATELET INHIBITION P2Y12: Platelet Function  P2Y12: 73 [PRU] — ABNORMAL LOW (ref 182–335)

## 2023-06-15 LAB — PREPARE RBC (CROSSMATCH)

## 2023-06-15 LAB — LACTIC ACID, PLASMA
Lactic Acid, Venous: 4.9 mmol/L (ref 0.5–1.9)
Lactic Acid, Venous: 2.8 mmol/L (ref 0.5–1.9)

## 2023-06-15 LAB — MRSA NEXT GEN BY PCR, NASAL: MRSA by PCR Next Gen: NOT DETECTED

## 2023-06-15 LAB — MAGNESIUM: Magnesium: 2 mg/dL (ref 1.7–2.4)

## 2023-06-15 LAB — ABO/RH: ABO/RH(D): A NEG

## 2023-06-15 MED ORDER — EPINEPHRINE HCL 5 MG/250ML IV SOLN IN NS
0.5000 ug/min | INTRAVENOUS | Status: DC
  Administered 2023-06-15: 0.5 ug/min via INTRAVENOUS
  Administered 2023-06-17: 1 ug/min via INTRAVENOUS
  Filled 2023-06-15 (×2): qty 250

## 2023-06-15 MED ORDER — INSULIN REGULAR(HUMAN) IN NACL 100-0.9 UT/100ML-% IV SOLN
INTRAVENOUS | Status: DC
  Administered 2023-06-15: 11.5 [IU]/h via INTRAVENOUS
  Administered 2023-06-15: 3.4 [IU]/h via INTRAVENOUS
  Filled 2023-06-15 (×2): qty 100

## 2023-06-15 MED ORDER — SODIUM CHLORIDE 0.9% IV SOLUTION: Freq: Once | INTRAVENOUS | Status: DC

## 2023-06-15 MED ORDER — NOREPINEPHRINE 4 MG/250ML-% IV SOLN
0.0000 ug/min | INTRAVENOUS | Status: AC
  Administered 2023-06-15: 29 ug/min via INTRAVENOUS
  Administered 2023-06-15 (×2): 40 ug/min via INTRAVENOUS
  Filled 2023-06-15 (×3): qty 250

## 2023-06-15 MED ORDER — FAMOTIDINE 20 MG PO TABS: 20.0000 mg | ORAL_TABLET | Freq: Every day | ORAL | Status: DC

## 2023-06-15 MED ORDER — NOREPINEPHRINE 4 MG/250ML-% IV SOLN
2.0000 ug/min | INTRAVENOUS | Status: DC
  Administered 2023-06-15: 2 ug/min via INTRAVENOUS
  Filled 2023-06-15: qty 250

## 2023-06-15 MED ORDER — RENA-VITE PO TABS
1.0000 | ORAL_TABLET | Freq: Every day | ORAL | Status: DC
  Administered 2023-06-16 – 2023-07-04 (×19): 1
  Filled 2023-06-15 (×20): qty 1

## 2023-06-15 MED ORDER — HYDROCORTISONE SOD SUC (PF) 100 MG IJ SOLR
100.0000 mg | Freq: Two times a day (BID) | INTRAMUSCULAR | Status: DC
  Administered 2023-06-15 – 2023-06-18 (×6): 100 mg via INTRAVENOUS
  Filled 2023-06-15 (×6): qty 2

## 2023-06-15 MED ORDER — NOREPINEPHRINE 16 MG/250ML-% IV SOLN
0.0000 ug/min | INTRAVENOUS | Status: DC
  Administered 2023-06-15: 32 ug/min via INTRAVENOUS
  Administered 2023-06-15: 39 ug/min via INTRAVENOUS
  Administered 2023-06-16: 12 ug/min via INTRAVENOUS
  Filled 2023-06-15 (×4): qty 250

## 2023-06-15 MED ORDER — VASOPRESSIN 20 UNITS/100 ML INFUSION FOR SHOCK
0.0000 [IU]/min | INTRAVENOUS | Status: DC
  Administered 2023-06-15 (×3): 0.04 [IU]/min via INTRAVENOUS
  Administered 2023-06-16 (×2): 0.03 [IU]/min via INTRAVENOUS
  Administered 2023-06-17: 0.02 [IU]/min via INTRAVENOUS
  Filled 2023-06-15 (×5): qty 100

## 2023-06-15 MED ORDER — PHENYLEPHRINE 80 MCG/ML (10ML) SYRINGE FOR IV PUSH (FOR BLOOD PRESSURE SUPPORT): 80.0000 ug | PREFILLED_SYRINGE | Freq: Once | INTRAVENOUS | Status: DC | PRN

## 2023-06-15 MED ORDER — LACTATED RINGERS IV SOLN: INTRAVENOUS | Status: AC

## 2023-06-15 MED ORDER — ORAL CARE MOUTH RINSE: 15.0000 mL | OROMUCOSAL | Status: DC | PRN

## 2023-06-15 MED ORDER — DEXTROSE-SODIUM CHLORIDE 5-0.45 % IV SOLN: INTRAVENOUS | Status: DC

## 2023-06-15 MED ORDER — VANCOMYCIN HCL 500 MG/100ML IV SOLN
500.0000 mg | INTRAVENOUS | Status: DC
  Filled 2023-06-15: qty 100

## 2023-06-15 MED ORDER — SODIUM CHLORIDE 0.9 % IV SOLN
2.0000 g | INTRAVENOUS | Status: DC
  Administered 2023-06-15 – 2023-06-17 (×3): 2 g via INTRAVENOUS
  Filled 2023-06-15 (×3): qty 12.5

## 2023-06-15 MED ORDER — FENTANYL 2500MCG IN NS 250ML (10MCG/ML) PREMIX INFUSION
0.0000 ug/h | INTRAVENOUS | Status: DC
  Administered 2023-06-15: 25 ug/h via INTRAVENOUS
  Administered 2023-06-16: 100 ug/h via INTRAVENOUS
  Administered 2023-06-17: 125 ug/h via INTRAVENOUS
  Administered 2023-06-17: 100 ug/h via INTRAVENOUS
  Administered 2023-06-18: 125 ug/h via INTRAVENOUS
  Administered 2023-06-19: 100 ug/h via INTRAVENOUS
  Administered 2023-06-19: 75 ug/h via INTRAVENOUS
  Administered 2023-06-21: 25 ug/h via INTRAVENOUS
  Filled 2023-06-15 (×6): qty 250

## 2023-06-15 MED ORDER — VANCOMYCIN HCL 2000 MG/400ML IV SOLN
2000.0000 mg | INTRAVENOUS | Status: AC
  Administered 2023-06-15: 2000 mg via INTRAVENOUS
  Filled 2023-06-15: qty 400

## 2023-06-15 MED ORDER — CHLORHEXIDINE GLUCONATE CLOTH 2 % EX PADS
6.0000 | MEDICATED_PAD | Freq: Every day | CUTANEOUS | Status: DC
  Administered 2023-06-15 – 2023-06-24 (×10): 6 via TOPICAL

## 2023-06-15 MED ORDER — LACTATED RINGERS IV BOLUS
1000.0000 mL | Freq: Once | INTRAVENOUS | Status: AC
  Administered 2023-06-15: 1000 mL via INTRAVENOUS

## 2023-06-15 MED ORDER — FENTANYL 2500MCG IN NS 250ML (10MCG/ML) PREMIX INFUSION
25.0000 ug/h | INTRAVENOUS | Status: DC
  Filled 2023-06-15: qty 250

## 2023-06-15 MED ORDER — CLOPIDOGREL BISULFATE 75 MG PO TABS: 75.0000 mg | ORAL_TABLET | Freq: Every evening | ORAL | Status: DC

## 2023-06-15 MED ORDER — SODIUM CHLORIDE 0.9 % IV SOLN
250.0000 mL | INTRAVENOUS | Status: AC
  Administered 2023-06-15: 1000 mL via INTRAVENOUS

## 2023-06-15 MED ORDER — DEXTROSE 50 % IV SOLN: 0.0000 mL | INTRAVENOUS | Status: DC | PRN

## 2023-06-15 MED ORDER — VASOPRESSIN 20 UNITS/100 ML INFUSION FOR SHOCK
INTRAVENOUS | Status: AC
  Administered 2023-06-15: 20 [IU]
  Filled 2023-06-15: qty 100

## 2023-06-15 MED ORDER — FENTANYL BOLUS VIA INFUSION
25.0000 ug | INTRAVENOUS | Status: DC | PRN
  Administered 2023-06-16: 50 ug via INTRAVENOUS
  Administered 2023-06-18: 25 ug via INTRAVENOUS
  Administered 2023-06-19: 100 ug via INTRAVENOUS
  Administered 2023-06-19: 50 ug via INTRAVENOUS
  Administered 2023-06-19 (×2): 100 ug via INTRAVENOUS
  Administered 2023-06-21: 50 ug via INTRAVENOUS

## 2023-06-15 MED ORDER — INSULIN ASPART 100 UNIT/ML IJ SOLN
0.0000 [IU] | INTRAMUSCULAR | Status: DC
  Administered 2023-06-15: 15 [IU] via SUBCUTANEOUS

## 2023-06-15 MED ORDER — PANTOPRAZOLE SODIUM 40 MG IV SOLR
40.0000 mg | Freq: Two times a day (BID) | INTRAVENOUS | Status: DC
  Administered 2023-06-15 – 2023-07-05 (×41): 40 mg via INTRAVENOUS
  Filled 2023-06-15 (×41): qty 10

## 2023-06-15 MED ORDER — MIDAZOLAM HCL 2 MG/2ML IJ SOLN
1.0000 mg | Freq: Once | INTRAMUSCULAR | Status: AC
  Administered 2023-06-15: 1 mg via INTRAVENOUS

## 2023-06-15 MED ORDER — FENTANYL CITRATE PF 50 MCG/ML IJ SOSY
25.0000 ug | PREFILLED_SYRINGE | Freq: Once | INTRAMUSCULAR | Status: AC
  Administered 2023-06-15: 50 ug via INTRAVENOUS

## 2023-06-15 MED ORDER — FENTANYL CITRATE PF 50 MCG/ML IJ SOSY
50.0000 ug | PREFILLED_SYRINGE | Freq: Once | INTRAMUSCULAR | Status: AC
  Administered 2023-06-15: 50 ug via INTRAVENOUS

## 2023-06-15 MED ORDER — PERFLUTREN LIPID MICROSPHERE
1.0000 mL | INTRAVENOUS | Status: AC | PRN
  Administered 2023-06-15: 6 mL via INTRAVENOUS

## 2023-06-15 MED ORDER — ORAL CARE MOUTH RINSE
15.0000 mL | OROMUCOSAL | Status: DC
  Administered 2023-06-15 – 2023-07-05 (×238): 15 mL via OROMUCOSAL

## 2023-06-15 MED ORDER — DEXMEDETOMIDINE HCL IN NACL 400 MCG/100ML IV SOLN: 0.0000 ug/kg/h | INTRAVENOUS | Status: DC

## 2023-06-15 MED ORDER — PROPOFOL 1000 MG/100ML IV EMUL
INTRAVENOUS | Status: AC
Start: 2023-06-15 — End: 2023-06-15
  Filled 2023-06-15: qty 100

## 2023-06-15 MED ORDER — SODIUM CHLORIDE 0.9 % IV SOLN: INTRAVENOUS | Status: AC | PRN

## 2023-06-15 MED ORDER — ETOMIDATE 2 MG/ML IV SOLN
20.0000 mg | Freq: Once | INTRAVENOUS | Status: AC
  Administered 2023-06-15: 20 mg via INTRAVENOUS

## 2023-06-15 MED ORDER — PROPOFOL 1000 MG/100ML IV EMUL
5.0000 ug/kg/min | INTRAVENOUS | Status: DC
  Administered 2023-06-15 (×2): 30 ug/kg/min via INTRAVENOUS
  Administered 2023-06-15: 5 ug/kg/min via INTRAVENOUS
  Administered 2023-06-16 – 2023-06-17 (×7): 30 ug/kg/min via INTRAVENOUS
  Administered 2023-06-17 – 2023-06-18 (×4): 40 ug/kg/min via INTRAVENOUS
  Administered 2023-06-18 (×2): 20 ug/kg/min via INTRAVENOUS
  Administered 2023-06-18 (×2): 40 ug/kg/min via INTRAVENOUS
  Filled 2023-06-15 (×3): qty 100
  Filled 2023-06-15 (×2): qty 200
  Filled 2023-06-15 (×11): qty 100

## 2023-06-15 MED ORDER — SODIUM CHLORIDE 0.9 % IV SOLN
3.0000 g | Freq: Two times a day (BID) | INTRAVENOUS | Status: DC
  Administered 2023-06-15: 3 g via INTRAVENOUS
  Filled 2023-06-15: qty 8

## 2023-06-15 MED ORDER — NOREPINEPHRINE 4 MG/250ML-% IV SOLN: 0.0000 ug/min | INTRAVENOUS | Status: DC

## 2023-06-15 NOTE — Procedures (Signed)
 Central Venous Catheter Insertion Procedure Note  Nathan Proctor  [id number]  Feb 18, 1941  Date:06/15/23  Time:10:17 AM   Provider Performing:Rhyleigh Grassel Mechele Collin, MD  Procedure: Insertion of Non-tunneled Central Venous Catheter(36556) with US guidance (40981)   Indication(s) Medication administration  Consent Unable to obtain consent due to emergent nature of procedure. Verbal consent obtained by family member  Anesthesia Topical only with 1% lidocaine , 6 cc  Timeout Verified patient identification, verified procedure, site/side was marked, verified correct patient position, special equipment/implants available, medications/allergies/relevant history reviewed, required imaging and test results available.  Sterile Technique Maximal sterile technique including full sterile barrier drape, hand hygiene, sterile gown, sterile gloves, mask, hair covering, sterile ultrasound probe cover (if used).  Procedure Description Area of catheter insertion was cleaned with chlorhexidine and draped in sterile fashion.  With real-time ultrasound guidance a central venous catheter was placed into the left internal jugular vein. Nonpulsatile blood flow and easy flushing noted in all ports.  The catheter was sutured in place and sterile dressing applied.    Complications/Tolerance None; patient tolerated the procedure well. Chest X-ray is ordered to verify placement for internal jugular or subclavian cannulation.   Chest x-ray is not ordered for femoral cannulation.  EBL Minimal  Specimen(s) None

## 2023-06-15 NOTE — Progress Notes (Addendum)
 STROKE TEAM PROGRESS NOTE   SUBJECTIVE (INTERVAL HISTORY) Overnight, patient had an episode of vomiting with coffee-ground emesis and subsequent tachycardia and tachypnea.  On evaluation this morning, he had increased work of breathing and was unable to maintain his SpO2 even on 100% O2 via nonrebreather.  Discussion was had with family, and family stated they would like to proceed with intubation and would like for patient to remain full code at this time.  CCM was consulted, and patient was transferred to ICU and intubated.  He did have an episode of aspiration during intubation and bronchoscopy was subsequently performed.  He was noted to be hypotensive after intubation, and Levophed and vasopressin were started.  Will hold Plavix for now given coffee-ground emesis and check P2 Y12.  Patient's creatinine was also noted to have worsened to 3.41 today. OBJECTIVE Temp:  [97.7 F (36.5 C)-100.4 F (38 C)] 100.4 F (38 C) (02/15 1200) Pulse Rate:  [100-131] 128 (02/15 0900) Cardiac Rhythm: Sinus tachycardia (02/14 1900) Resp:  [18-32] 22 (02/15 0900) BP: (89-159)/(54-98) 89/70 (02/15 0900) SpO2:  [83 %-100 %] 93 % (02/15 0900) FiO2 (%):  [100 %] 100 % (02/15 0850) Weight:  [119.7 kg] 119.7 kg (02/15 0500)  Recent Labs  Lab 06/14/23 1633 06/14/23 2026 06/15/23 0005 06/15/23 0756 06/15/23 1132  GLUCAP 168* 183* 208* 381* 319*   Recent Labs  Lab 06/09/23 0720 06/10/23 0613 06/11/23 0544 06/15/23 0625 06/15/23 1151  NA 144 147* 146* 143 141  K 4.2 4.3 3.8 5.1 5.0  CL 113* 110 109 105  --   CO2 25 27 25 22   --   GLUCOSE 290* 128* 128* 351*  --   BUN 42* 43* 45* 64*  --   CREATININE 1.87* 1.78* 1.81* 3.41*  --   CALCIUM 9.0 9.5 9.5 9.4  --    No results for input(s): "AST", "ALT", "ALKPHOS", "BILITOT", "PROT", "ALBUMIN" in the last 168 hours.  Recent Labs  Lab 06/09/23 0720 06/10/23 3244 06/11/23 0544 06/15/23 0625 06/15/23 1101 06/15/23 1151  WBC 7.8 8.2 8.1 10.1 26.5*   --   HGB 9.7* 10.4* 10.0* 11.2* 9.1* 11.2*  HCT 31.2* 33.0* 31.6* 34.4* 29.7* 33.0*  MCV 97.8 97.9 96.6 96.9 102.4*  --   PLT 144* 165 187 317 233  --    No results for input(s): "CKTOTAL", "CKMB", "CKMBINDEX", "TROPONINI" in the last 168 hours. No results for input(s): "LABPROT", "INR" in the last 72 hours.  No results for input(s): "COLORURINE", "LABSPEC", "PHURINE", "GLUCOSEU", "HGBUR", "BILIRUBINUR", "KETONESUR", "PROTEINUR", "UROBILINOGEN", "NITRITE", "LEUKOCYTESUR" in the last 72 hours.  Invalid input(s): "APPERANCEUR"      Component Value Date/Time   CHOL 183 06/06/2023 0557   TRIG 81 06/06/2023 0557   HDL 60 06/06/2023 0557   CHOLHDL 3.1 06/06/2023 0557   VLDL 16 06/06/2023 0557   LDLCALC 107 (H) 06/06/2023 0557   LDLCALC 223 (H) 05/31/2021 0000   Lab Results  Component Value Date   HGBA1C 6.1 (H) 06/05/2023      Component Value Date/Time   LABOPIA NONE DETECTED 06/05/2023 1653   COCAINSCRNUR NONE DETECTED 06/05/2023 1653   LABBENZ NONE DETECTED 06/05/2023 1653   AMPHETMU NONE DETECTED 06/05/2023 1653   THCU NONE DETECTED 06/05/2023 1653   LABBARB NONE DETECTED 06/05/2023 1653    No results for input(s): "ETH" in the last 168 hours.   I have personally reviewed the radiological images below and agree with the radiology interpretations.  DG CHEST PORT 1 VIEW Result Date:  06/15/2023 CLINICAL DATA:  10031 Cough 10031 EXAM: PORTABLE CHEST 1 VIEW COMPARISON:  June 08, 2023 FINDINGS: The cardiomediastinal silhouette is unchanged in contour.Unchanged elevation of the RIGHT hemidiaphragm. No pleural effusion. No pneumothorax. Bibasilar platelike opacities. Atherosclerotic calcifications. Similar background of interstitial prominence. IMPRESSION: Bibasilar platelike opacities, favored to reflect atelectasis. Electronically Signed   By: Meda Klinefelter M.D.   On: 06/15/2023 12:07   DG CHEST PORT 1 VIEW Result Date: 06/15/2023 CLINICAL DATA:  Respiratory failure,  intubation and central line placement. EXAM: PORTABLE CHEST 1 VIEW COMPARISON:  Film earlier today at 0734 hours FINDINGS: Interval intubation with the endotracheal tube tip approximately 3.5 cm above the carina. Left jugular central line placement with the line tip in the upper SVC. No pneumothorax. Volume loss of the right lung remains with elevation of the right hemidiaphragm. Improved expansion of the left lung. Potential underlying mild pulmonary interstitial edema. No significant pleural effusions. IMPRESSION: 1. Interval intubation and left jugular central line placement. No pneumothorax. 2. Improved expansion of the left lung. Persistent volume loss of the right lung with elevation of the right hemidiaphragm. Potential underlying mild pulmonary interstitial edema. Electronically Signed   By: Irish Lack M.D.   On: 06/15/2023 10:42   DG Swallowing Func-Speech Pathology Result Date: 06/12/2023 Table formatting from the original result was not included. Modified Barium Swallow Study Patient Details Name: Emin Foree. MRN: [id number] Date of Birth: Sep 17, 1940 Today's Date: 06/12/2023 HPI/PMH: HPI: Pt is a 83 y.o. male who was admitted as a code stroke on 02/05 due to acute onset of right-sided weakness, right facial droop and aphasia. MRI from 02/06 displayed an evolving acute left MCA infarct, most pronounced at the left insula and left frontal operculum. Pt has intentionally lost 300 pounds in the recent past, with pre-loss weight at ~600Ib. PMHx includes hypertension, diabetes, CKD, prior CVA. Clinical Impression: Clinical Impression: Pt presents with a mild-moderate oropharyngeal dysphagia (DIGEST Score: 1) primarily characterized by silent penetration of thin liquid at level of VFs, flash penetration of nectar-thick above VFs, and diffuse pharyngeal residue. Pt's dysphagia severity level dropped from DIGEST score of 3 to 1, compared to previous MBS eval on 02/07.     Pharyngeal residue was  present throughout all consistencies. Pt's posture for swallowing was consistently neutral. Pt follows commands for compensatory strategies more readily now with max multimodal cueing. Effective compensatory strategies include multiple swallows, throat clearing, and a straw when used with a verbally cued slow rate. Multiple consecutive sips of thin-liquid presented the most risk for aspiration due to silent penetration of Vfs. Unlike previous MBS study, pt tolerated solid consistency, displaying prolonged mastication but majority clearance of bolus after 2-3 swallows.     Recommendations include upgrading pt's diet to dysphagia 2 (finely chopped) and thin liquids to optimize safety and efficiency for swallowing. SLP f/u to monitor tolerance of thin-liquids with compensatory strategies is highly recommended. Pt needs to remain upright for 30 minutes after PO intake due to remaining pharyngeal residue to optimize airway safety. Factors that may increase risk of adverse event in presence of aspiration [name] & Clearance [name] 2021): Factors that may increase risk of adverse event in presence of aspiration [name] & Clearance [name] 2021): Dependence for feeding and/or oral hygiene Recommendations/Plan: Swallowing Evaluation Recommendations Swallowing Evaluation Recommendations Recommendations: PO diet PO Diet Recommendation: Dysphagia 2 (Finely chopped); Thin liquids (Level 0) Liquid Administration via: Straw Medication Administration: Crushed with puree Supervision: Full supervision/cueing for swallowing strategies; Full assist for feeding Swallowing strategies  :  Slow rate; Small bites/sips; Multiple dry swallows after each bite/sip; Clear throat intermittently Postural changes: Position pt fully upright for meals; Stay upright 30-60 min after meals Oral care recommendations: Oral care BID (2x/day); Staff/trained caregiver to provide oral care Treatment Plan Treatment Plan Follow-up recommendations: Follow physicians's  recommendations for discharge plan and follow up therapies Functional status assessment: Patient has had a recent decline in their functional status and demonstrates the ability to make significant improvements in function in a reasonable and predictable amount of time. Recommendations Recommendations for follow up therapy are one component of a multi-disciplinary discharge planning process, led by the attending physician.  Recommendations may be updated based on patient status, additional functional criteria and insurance authorization. Assessment: Orofacial Exam: Orofacial Exam Oral Cavity: Oral Hygiene: WFL Oral Cavity - Dentition: Adequate natural dentition Orofacial Anatomy: WFL Anatomy: Anatomy: WFL Boluses Administered: Boluses Administered Boluses Administered: Thin liquids (Level 0); Mildly thick liquids (Level 2, nectar thick); Moderately thick liquids (Level 3, honey thick); Puree; Solid  Oral Impairment Domain: Oral Impairment Domain Lip Closure: Interlabial escape, no progression to anterior lip Tongue control during bolus hold: Cohesive bolus between tongue to palatal seal Bolus preparation/mastication: Slow prolonged chewing/mashing with complete recollection Bolus transport/lingual motion: Brisk tongue motion Oral residue: Residue collection on oral structures Location of oral residue : Tongue Initiation of pharyngeal swallow : Pyriform sinuses  Pharyngeal Impairment Domain: Pharyngeal Impairment Domain Soft palate elevation: No bolus between soft palate (SP)/pharyngeal wall (PW) Laryngeal elevation: Partial superior movement of thyroid cartilage/partial approximation of arytenoids to epiglottic petiole Anterior hyoid excursion: Complete anterior movement Epiglottic movement: Complete inversion Laryngeal vestibule closure: Incomplete, narrow column air/contrast in laryngeal vestibule Pharyngeal stripping wave : Present - complete Pharyngeal contraction (A/P view only): N/A Pharyngoesophageal segment  opening: Complete distension and complete duration, no obstruction of flow Tongue base retraction: Trace column of contrast or air between tongue base and PPW Pharyngeal residue: Collection of residue within or on pharyngeal structures Location of pharyngeal residue: Diffuse (>3 areas)  Esophageal Impairment Domain: Esophageal Impairment Domain Esophageal clearance upright position: -- (Not tested) Pill: No data recorded Penetration/Aspiration Scale Score: Penetration/Aspiration Scale Score 1.  Material does not enter airway: Solid; Puree; Moderately thick liquids (Level 3, honey thick) 2.  Material enters airway, remains ABOVE vocal cords then ejected out: Mildly thick liquids (Level 2, nectar thick) 5.  Material enters airway, CONTACTS cords and not ejected out: Thin liquids (Level 0) Compensatory Strategies: Compensatory Strategies Compensatory strategies: Yes Straw: Effective Effective Straw: Mildly thick liquid (Level 2, nectar thick) (Effective with slow rate applied.) Multiple swallows: Effective Effective Multiple Swallows: Thin liquid (Level 0); Mildly thick liquid (Level 2, nectar thick); Moderately thick liquid (Level 3, honey thick); Puree; Solid   General Information: Caregiver present: No  Diet Prior to this Study: Dysphagia 1 (pureed); Moderately thick liquids (Level 3, honey thick)   Temperature : Normal   Respiratory Status: WFL   Supplemental O2: None (Room air)   History of Recent Intubation: Yes  Behavior/Cognition: Alert; Cooperative; Pleasant mood; Requires cueing Self-Feeding Abilities: Needs assist with self-feeding Baseline vocal quality/speech: Normal Volitional Cough: Unable to elicit Volitional Swallow: Able to elicit Exam Limitations: No limitations Goal Planning: Prognosis for improved oropharyngeal function: Good Barriers to Reach Goals: Language deficits No data recorded No data recorded No data recorded Pain: Pain Assessment Pain Assessment: No/denies pain Pain Score: 0 End of  Session: Start Time:SLP Start Time (ACUTE ONLY): 1310 Stop Time: SLP Stop Time (ACUTE ONLY): 1335 Time Calculation:SLP Time  Calculation (min) (ACUTE ONLY): 25 min Charges: SLP Evaluations $ SLP Speech Visit: 1 Visit SLP Evaluations $MBS Swallow: 1 Procedure $Swallowing Treatment: 1 Procedure SLP visit diagnosis: SLP Visit Diagnosis: Dysphagia, oropharyngeal phase (R13.12) Past Medical History: Past Medical History: Diagnosis Date  Anemia of chronic disease   Ankle fracture 02/15/2016  Cancer (HCC)   Prostate  Chronic constipation   Chronic kidney disease   stage III  Diabetes mellitus without complication (HCC)   type II   Diabetic peripheral neuropathy (HCC)   Failure to thrive (0-17)   Fracture of left lower leg   Gout   Hyperlipidemia   Hypertension   Morbid obesity (HCC)   Unstable gait  Past Surgical History: Past Surgical History: Procedure Laterality Date  IR CT HEAD LTD  06/05/2023  IR INTRAVSC STENT CERV CAROTID W/O EMB-PROT MOD SED INC ANGIO  06/05/2023  IR PERCUTANEOUS ART THROMBECTOMY/INFUSION INTRACRANIAL INC DIAG ANGIO  06/05/2023  IR US GUIDE VASC ACCESS RIGHT  06/05/2023  ORIF ANKLE FRACTURE Left 02/29/2016  Procedure: OPEN REDUCTION INTERNAL FIXATION (ORIF) ANKLE FRACTURE;  Surgeon: Yolonda Kida, MD;  Location: [facility];  Service: Orthopedics;  Laterality: Left;  PROSTATE SURGERY    RADIOLOGY WITH ANESTHESIA N/A 06/05/2023  Procedure: IR WITH ANESTHESIA;  Surgeon: Radiologist, Medication, MD;  Location: [facility];  Service: Radiology;  Laterality: N/A; Claudine Mouton 06/12/2023, 3:16 PM  DG Abd 1 View Result Date: 06/09/2023 CLINICAL DATA:  [id number]. Encounter for feeding tube placement. EXAM: ABDOMEN - 1 VIEW COMPARISON:  Abdomen film 06/05/2023. FINDINGS: Dobbhoff feeding tube is well placed with the radiopaque tip in the distal stomach. The visualized bowel pattern is nonobstructive. There is barium newly seen in the flexures and transverse colon. There is no supine evidence of free air. There is  atelectasis in the lung bases. Cardiomegaly. IMPRESSION: 1. Dobbhoff feeding tube well placed with the radiopaque tip in the distal stomach. 2. Nonobstructive bowel gas pattern. 3. Cardiomegaly. Electronically Signed   By: Almira Bar M.D.   On: 06/09/2023 03:41   DG CHEST PORT 1 VIEW Result Date: 06/08/2023 CLINICAL DATA:  CHF EXAM: PORTABLE CHEST 1 VIEW COMPARISON:  01/06/2023 FINDINGS: Feeding tube in place. There is cardiomegaly with vascular congestion. Chronic elevation of the right hemidiaphragm with right base atelectasis. Interstitial prominence throughout the lungs, right greater than left could reflect interstitial edema. Possible small effusions. No acute bony abnormality. IMPRESSION: Cardiomegaly with vascular congestion and probable mild interstitial edema. Question small bilateral effusions. Chronic elevation of the right hemidiaphragm with right base atelectasis. Electronically Signed   By: Charlett Nose M.D.   On: 06/08/2023 18:20   DG Swallowing Func-Speech Pathology Result Date: 06/07/2023 Table formatting from the original result was not included. Modified Barium Swallow Study Study completed and documented by [name], SLP Student Supervised and reviewed by Harlon Ditty, MA CCC-SLP [facility] Secure Chat Preferred Office [phone] Patient Details Name: Anastasios Melander. MRN: [id number] Date of Birth: [birth year] Today's Date: 06/07/2023 HPI/PMH: HPI: Pt is a 83 y.o. male who was admitted as a code stroke on 02/05 due to acute onset of right-sided weakness, right facial droop and aphasia. MRI from 02/06 displayed an evolving acute left MCA infarct, most pronounced at the left insula and left frontal operculum. Pt has intentionally lost 300 pounds in the recent past, with pre-loss weight at ~600Ib. PMHx includes hypertension, diabetes, CKD, prior CVA. Clinical Impression: Clinical Impression: Pt presented with a moderate-severe oropharyngeal dysphagia (DIGEST  Score: 3) primarily  characterized by silent aspiration of thin liquid, silent penetration of nectar-thick liquid, diffuse pharyngeal residue collection during thin/nectar-thick consistencies, and posterior-escape of bolus across multiple consistencies. Pt positioning was a potential limitation for examination due to consistent posterior head position. More anterior head positioning during nectar-thick liquid displayed less pharyngeal residue, increased hyo-laryngeal excursion, and overall better airway safety. This positioning was not present throughout due to pt's receptive language deficits to understand cues. Alternating between honey-thick and puree consistencies provided more oropharyngeal bolus clearance and no signs of aspiration. Recommend a dysphagia 1 (puree) and honey-thick liquid diet to optimize pt's airway safety and potential for meeting nutritional needs. Slow rate, small bites/sips, natural head posturing, and multiple swallows are compensatory strategies that displayed best efficiency with pt. F/u with SLP for family education, compensatory strategy training, and upgraded diet trials is recommended. Factors that may increase risk of adverse event in presence of aspiration [name] & Clearance [name] 2021): Factors that may increase risk of adverse event in presence of aspiration [name] & Clearance [name] 2021): Weak cough Recommendations/Plan: Swallowing Evaluation Recommendations Swallowing Evaluation Recommendations Recommendations: PO diet PO Diet Recommendation: Dysphagia 1 (Pureed); Moderately thick liquids (Level 3, honey thick) Liquid Administration via: Spoon Medication Administration: Crushed with puree Supervision: Full supervision/cueing for swallowing strategies; Full assist for feeding Swallowing strategies  : Slow rate; Small bites/sips; Multiple dry swallows after each bite/sip (Pt needs to be in a neutral head position (no posterior head tilt).) Postural changes: Position pt fully upright for meals;  Stay upright 30-60 min after meals Oral care recommendations: Oral care BID (2x/day); Staff/trained caregiver to provide oral care Caregiver Recommendations: Have oral suction available Treatment Plan Treatment Plan Treatment recommendations: Therapy as outlined in treatment plan below Follow-up recommendations: Follow physicians's recommendations for discharge plan and follow up therapies Functional status assessment: Patient has had a recent decline in their functional status and demonstrates the ability to make significant improvements in function in a reasonable and predictable amount of time. Treatment frequency: Min 2x/week Treatment duration: 2 weeks Interventions: Aspiration precaution training; Compensatory techniques; Patient/family education; Diet toleration management by SLP; Trials of upgraded texture/liquids Recommendations Recommendations for follow up therapy are one component of a multi-disciplinary discharge planning process, led by the attending physician.  Recommendations may be updated based on patient status, additional functional criteria and insurance authorization. Assessment: Orofacial Exam: Orofacial Exam Oral Cavity - Dentition: Adequate natural dentition Orofacial Anatomy: WFL Anatomy: Anatomy: WFL Boluses Administered: Boluses Administered Boluses Administered: Thin liquids (Level 0); Mildly thick liquids (Level 2, nectar thick); Moderately thick liquids (Level 3, honey thick); Puree; Solid  Oral Impairment Domain: Oral Impairment Domain Lip Closure: Escape progressing to mid-chin Tongue control during bolus hold: Posterior escape of greater than half of bolus Bolus preparation/mastication: -- (Pt did not chew solid and SLP had to remove it from mouth.) Bolus transport/lingual motion: Repetitive/disorganized tongue motion Oral residue: Residue collection on oral structures Location of oral residue : Tongue; Palate Initiation of pharyngeal swallow : Pyriform sinuses  Pharyngeal  Impairment Domain: Pharyngeal Impairment Domain Soft palate elevation: No bolus between soft palate (SP)/pharyngeal wall (PW) Laryngeal elevation: Partial superior movement of thyroid cartilage/partial approximation of arytenoids to epiglottic petiole Anterior hyoid excursion: Partial anterior movement Epiglottic movement: Partial inversion Laryngeal vestibule closure: Incomplete, narrow column air/contrast in laryngeal vestibule Pharyngeal stripping wave : Present - complete Pharyngeal contraction (A/P view only): N/A Pharyngoesophageal segment opening: Complete distension and complete duration, no obstruction of flow Tongue base retraction: Narrow column of contrast or air between tongue base  and PPW Pharyngeal residue: Collection of residue within or on pharyngeal structures Location of pharyngeal residue: Diffuse (>3 areas)  Esophageal Impairment Domain: Esophageal Impairment Domain Esophageal clearance upright position: -- (Not tested.) Pill: No data recorded Penetration/Aspiration Scale Score: Penetration/Aspiration Scale Score 1.  Material does not enter airway: Puree; Moderately thick liquids (Level 3, honey thick) 3.  Material enters airway, remains ABOVE vocal cords and not ejected out: Mildly thick liquids (Level 2, nectar thick) 8.  Material enters airway, passes BELOW cords without attempt by patient to eject out (silent aspiration) : Thin liquids (Level 0) Compensatory Strategies: Compensatory Strategies Compensatory strategies: Yes Straw: Ineffective Multiple swallows: Effective (Required max cueing due to pt's language.) Effective Multiple Swallows: Puree; Moderately thick liquid (Level 3, honey thick); Mildly thick liquid (Level 2, nectar thick)   General Information: Caregiver present: No  Diet Prior to this Study: NPO   Temperature : Normal   Respiratory Status: WFL   Supplemental O2: None (Room air)   No data recorded Behavior/Cognition: Alert; Cooperative; Pleasant mood; Requires cueing  Self-Feeding Abilities: Needs assist with self-feeding Baseline vocal quality/speech: Dysphonic Volitional Cough: Unable to elicit Volitional Swallow: Able to elicit Exam Limitations: Poor positioning Goal Planning: Prognosis for improved oropharyngeal function: Good Barriers to Reach Goals: Language deficits No data recorded No data recorded Consulted and agree with results and recommendations: Pt unable/family or caregiver not available Pain: Pain Assessment Pain Assessment: No/denies pain Faces Pain Scale: 0 Facial Expression: 0 Body Movements: 0 Muscle Tension: 0 Compliance with ventilator (intubated pts.): N/A Vocalization (extubated pts.): 0 CPOT Total: 0 End of Session: Start Time:SLP Start Time (ACUTE ONLY): 0903 Stop Time: SLP Stop Time (ACUTE ONLY): 0930 Time Calculation:SLP Time Calculation (min) (ACUTE ONLY): 27 min Charges: SLP Evaluations $ SLP Speech Visit: 1 Visit SLP Evaluations $BSS Swallow: 1 Procedure $MBS Swallow: 1 Procedure $ SLP EVAL LANGUAGE/SOUND PRODUCTION: 1 Procedure $Swallowing Treatment: 1 Procedure SLP visit diagnosis: SLP Visit Diagnosis: Dysphagia, oropharyngeal phase (R13.12) Past Medical History: Past Medical History: Diagnosis Date  Anemia of chronic disease   Ankle fracture 02/15/2016  Cancer (HCC)   Prostate  Chronic constipation   Chronic kidney disease   stage III  Diabetes mellitus without complication (HCC)   type II   Diabetic peripheral neuropathy (HCC)   Failure to thrive (0-17)   Fracture of left lower leg   Gout   Hyperlipidemia   Hypertension   Morbid obesity (HCC)   Unstable gait  Past Surgical History: Past Surgical History: Procedure Laterality Date  IR CT HEAD LTD  06/05/2023  IR INTRAVSC STENT CERV CAROTID W/O EMB-PROT MOD SED INC ANGIO  06/05/2023  IR PERCUTANEOUS ART THROMBECTOMY/INFUSION INTRACRANIAL INC DIAG ANGIO  06/05/2023  IR US GUIDE VASC ACCESS RIGHT  06/05/2023  ORIF ANKLE FRACTURE Left 02/29/2016  Procedure: OPEN REDUCTION INTERNAL FIXATION (ORIF) ANKLE  FRACTURE;  Surgeon: Yolonda Kida, MD;  Location: [facility];  Service: Orthopedics;  Laterality: Left;  PROSTATE SURGERY    RADIOLOGY WITH ANESTHESIA N/A 06/05/2023  Procedure: IR WITH ANESTHESIA;  Surgeon: Radiologist, Medication, MD;  Location: [facility];  Service: Radiology;  Laterality: N/A; [place], Riley Nearing 06/07/2023, 12:15 PM  CT HEAD WO CONTRAST ( ) Result Date: 06/07/2023 CLINICAL DATA:  83 year old male status post code stroke presentation, left MCA M2 occlusion, with left MCA infarct with confluent petechial hemorrhage. EXAM: CT HEAD WITHOUT CONTRAST TECHNIQUE: Contiguous axial images were obtained from the base of the skull through the vertex without intravenous contrast. RADIATION DOSE REDUCTION: This exam  was performed according to the departmental dose-optimization program which includes automated exposure control, adjustment of the mA and/or kV according to patient size and/or use of iterative reconstruction technique. COMPARISON:  Brain MRI yesterday.  Head CT 06/05/2023. FINDINGS: Brain: Confluent mixed density infarct in the left MCA middle division, epicenter at the insula and operculum. Confluent petechial hemorrhage on series 3, image 16 appears stable from the MRI. Superimposed trace subarachnoid blood is difficult to exclude by CT (series 3, image 13), but was not apparent on MRI. Stable mild mass effect on the left lateral ventricle with only trace rightward midline shift (series 3, image 16). No ventriculomegaly. Elsewhere gray-white differentiation is stable from the presentation CT. Basilar cisterns remain patent. Vascular: Resolved hyperdense left MCA since presentation. Calcified atherosclerosis at the skull base. Skull: Stable, intact. Sinuses/Orbits: Visualized paranasal sinuses and mastoids are stable and well aerated. Other: Left nasoenteric tube now in place. Stable orbit and scalp soft tissues. IMPRESSION: 1. Stable by CT confluent Left MCA middle division infarct with  petechial hemorrhage (Heidelberg classification 1b: HI2, confluent petechiae, no mass effect). Questionable trace superimposed SAH, but was not apparent on MRI. 2. Stable minimal intracranial mass effect. 3. No new intracranial abnormality. Electronically Signed   By: Odessa Fleming M.D.   On: 06/07/2023 05:37   MR BRAIN WO CONTRAST Result Date: 06/06/2023 CLINICAL DATA:  Follow-up examination for stroke. EXAM: MRI HEAD WITHOUT CONTRAST TECHNIQUE: Multiplanar, multiecho pulse sequences of the brain and surrounding structures were obtained without intravenous contrast. COMPARISON:  Comparison made to multiple previous exams from 06/05/2023. FINDINGS: Brain: Examination degraded by motion artifact. Cerebral volume within normal limits for age. Patchy T2/FLAIR hyperintensity involving the periventricular deep white matter both cerebral hemispheres, consistent with chronic small vessel ischemic disease, mild in nature. Small remote infarct noted within the right cerebellum. Confluent restricted diffusion involving the left insula and left frontal operculum, consistent with evolving acute left MCA distribution infarct. Area of infarction measures up to approximately 6 cm in AP diameter. Prominent associated susceptibility artifact, consistent with petechial hemorrhage (series 7, image 61) ( Heidelberg classification 1b: HI2, confluent petechiae, no mass effect. No frank or organized hematoma evident by MRI. No significant regional mass effect. Few additional small foci of infarction noted within the left parieto-occipital region posteriorly (series 3, images 40, 29). Apparent diffusion signal along the right frontal parafalcine region felt to be consistent with artifact related to dural calcification. Note made of a few additional chronic micro hemorrhages within the left cerebellum and right thalamus. No mass lesion or midline shift. Ventricles normal size without hydrocephalus. No extra-axial fluid collection. Pituitary  gland suprasellar region within normal limits. Vascular: Major intracranial vascular flow voids are maintained. Skull and upper cervical spine: Craniocervical junction within normal limits. Bone marrow signal intensity overall within normal limits. No scalp soft tissue abnormality. Sinuses/Orbits: Prior bilateral ocular lens replacement. Paranasal sinuses are clear. No mastoid effusion. Other: None. IMPRESSION: 1. Evolving acute left MCA distribution infarct, most pronounced at the left insula and left frontal operculum. Prominent associated petechial hemorrhage without frank intraparenchymal hematoma (Heidelberg classification 1b: HI2, confluent petechiae, no mass effect). 2. Few additional small foci of acute infarction within the left parieto-occipital region posteriorly. 3. Underlying mild chronic microvascular ischemic disease with small remote right cerebellar infarct. Electronically Signed   By: Rise Mu M.D.   On: 06/06/2023 03:12   ECHOCARDIOGRAM COMPLETE Result Date: 06/05/2023    ECHOCARDIOGRAM REPORT   Patient Name:   Fong Mccarry. Date  of Exam: 06/05/2023 Medical Rec #:  [id number]              Height:       70.0 in Accession #:    [id number]             Weight:       292.3 lb Date of Birth:  [birth year]              BSA:          2.453 m Patient Age:    82 years               BP:           129/72 mmHg Patient Gender: M                      HR:           89 bpm. Exam Location:  Inpatient Procedure: 2D Echo, Color Doppler, Cardiac Doppler and Intracardiac            Opacification Agent Indications:    Stroke I63.9  History:        Patient has prior history of Echocardiogram examinations, most                 recent 10/06/2019. Risk Factors:Diabetes, Hypertension and                 Dyslipidemia.  Sonographer:    Harriette Bouillon RDCS Referring Phys: Lynnae January IMPRESSIONS  1. Left ventricular ejection fraction, by estimation, is 30%. The left ventricle has moderately decreased function.  The left ventricle demonstrates global hypokinesis. There is mild left ventricular hypertrophy.  2. Right ventricular systolic function is normal. The right ventricular size is normal.  3. Trivial mitral valve regurgitation.  4. The aortic valve is tricuspid. Aortic valve regurgitation is not visualized.  5. The inferior vena cava is normal in size with greater than 50% respiratory variability, suggesting right atrial pressure of 3 mmHg. Comparison(s): The left ventricular function is worsened. FINDINGS  Left Ventricle: Left ventricular ejection fraction, by estimation, is 30%. The left ventricle has moderately decreased function. The left ventricle demonstrates global hypokinesis. Definity contrast agent was given IV to delineate the left ventricular endocardial borders. The left ventricular internal cavity size was normal in size. There is mild left ventricular hypertrophy. Right Ventricle: The right ventricular size is normal. Right vetricular wall thickness was not assessed. Right ventricular systolic function is normal. Left Atrium: Left atrial size was normal in size. Right Atrium: Right atrial size was normal in size. Pericardium: There is no evidence of pericardial effusion. Mitral Valve: There is mild thickening of the mitral valve leaflet(s). Mild to moderate mitral annular calcification. Trivial mitral valve regurgitation. Tricuspid Valve: The tricuspid valve is normal in structure. Tricuspid valve regurgitation is trivial. Aortic Valve: The aortic valve is tricuspid. Aortic valve regurgitation is not visualized. Pulmonic Valve: The pulmonic valve was normal in structure. Pulmonic valve regurgitation is not visualized. Aorta: The aortic root and ascending aorta are structurally normal, with no evidence of dilitation. Venous: The inferior vena cava is normal in size with greater than 50% respiratory variability, suggesting right atrial pressure of 3 mmHg. IAS/Shunts: The interatrial septum was not  assessed.  LEFT VENTRICLE PLAX 2D LVIDd:         5.00 cm   Diastology LVIDs:         4.40 cm   LV e' lateral: 5.33 cm/s LV  PW:         1.20 cm LV IVS:        1.10 cm LVOT diam:     2.30 cm LV SV:         55 LV SV Index:   23 LVOT Area:     4.15 cm  RIGHT VENTRICLE RV S prime:     14.40 cm/s LEFT ATRIUM         Index LA diam:    4.60 cm 1.88 cm/m  AORTIC VALVE LVOT Vmax:   65.50 cm/s LVOT Vmean:  45.200 cm/s LVOT VTI:    0.133 m  AORTA Ao Root diam: 3.00 cm Ao Asc diam:  3.50 cm  SHUNTS Systemic VTI:  0.13 m Systemic Diam: 2.30 cm Dietrich Pates MD Electronically signed by Dietrich Pates MD Signature Date/Time: 06/05/2023/9:33:38 PM    Final    DG Abd Portable 1V Result Date: 06/05/2023 CLINICAL DATA:  Feeding tube placement. EXAM: PORTABLE ABDOMEN - 1 VIEW COMPARISON:  None Available. FINDINGS: Distal tip of feeding tube is seen in expected position of distal stomach. IMPRESSION: Distal tip of feeding tube seen in expected position of distal stomach. Electronically Signed   By: Lupita Raider M.D.   On: 06/05/2023 15:49   CT HEAD WO CONTRAST Result Date: 06/05/2023 CLINICAL DATA:  Stroke, follow-up. Status post intracranial mechanical thrombectomy and stenting of the left ICA bifurcation. EXAM: CT HEAD WITHOUT CONTRAST TECHNIQUE: Contiguous axial images were obtained from the base of the skull through the vertex without intravenous contrast. RADIATION DOSE REDUCTION: This exam was performed according to the departmental dose-optimization program which includes automated exposure control, adjustment of the mA and/or kV according to patient size and/or use of iterative reconstruction technique. COMPARISON:  CT head without contrast and CT angio head and neck 06/05/2023. By plain CT 06/05/2023. FINDINGS: Brain: The study is mildly degraded by patient motion. The left insular and left opercular infarct is somewhat obscured by patient motion. The cortex is slightly hyperdense, potentially reflecting reperfusion. No  hemorrhage is present. Basal ganglia are intact. Subcortical white matter hypoattenuation in the high left frontal lobe is new. Mild right-sided white matter disease is stable. Basal ganglia are intact. A remote lacunar infarct is again noted in the right cerebellum. The brainstem and cerebellum are otherwise within normal limits. Vascular: Atherosclerotic calcifications are present within the cavernous internal carotid arteries and at the normal origin of both vertebral arteries. No hyperdense vessel is present. Skull: Calvarium is intact. No focal lytic or blastic lesions are present. No significant extracranial soft tissue lesion is present. Sinuses/Orbits: The paranasal sinuses and mastoid air cells are clear. Bilateral lens replacements are noted. Globes and orbits are otherwise unremarkable. IMPRESSION: 1. The left insular and left opercular infarct is somewhat obscured by patient motion. 2. The cortex is slightly hyperdense, potentially reflecting reperfusion. 3. No hemorrhage. 4. Subcortical white matter hypoattenuation in the high left frontal lobe is new. This may reflect ischemic changes or edema related to the reperfusion. 5. Remote lacunar infarct of the right cerebellum. 6. Stable mild right-sided white matter disease. This likely reflects the sequela of chronic microvascular ischemia. Electronically Signed   By: Marin Roberts M.D.   On: 06/05/2023 14:38   IR PERCUTANEOUS ART THROMBECTOMY/INFUSION INTRACRANIAL INC DIAG ANGIO Result Date: 06/05/2023 INDICATION: 83 year old male presenting with right-sided weakness and aphasia; NIHSS 28. His last known well was 10 p.m. on 06/04/2023. His past medical history significant for prior stroke, hypertension, diabetes and chronic  kidney disease; baseline modified Rankin scale 0. Head CT showed hypodensity within the left insula, basal ganglia and left frontal operculum (ASPECTS 7). No IV thrombolytic given as patient was outside the window. CT angiogram  of the head and neck showed an occlusion of a left M2/MCA anterior division branch. CT perfusion showed a 41 mL core infarct with a 45 mL ischemic penumbra. She was transferred to our service for mechanical thrombectomy. EXAM: ULTRASOUND-GUIDED VASCULAR ACCESS DIAGNOSTIC CEREBRAL ANGIOGRAM MECHANICAL THROMBECTOMY FLAT PANEL HEAD CT LEFT CAROTID STENTING AND ANGIOPLASTY WITHOUT CEREBRAL PROTECTION DEVICE COMPARISON:  CT/CT angiogram of the head and neck June 05, 2023. MEDICATIONS: No antibiotics administered. ANESTHESIA/SEDATION: The procedure was performed under general anesthesia. CONTRAST:  80 mL of Omnipaque 300 milligram/mL FLUOROSCOPY: Radiation Exposure Index (as provided by the fluoroscopic device): 1315 mGy Kerma COMPLICATIONS: None immediate. TECHNIQUE: Informed written consent was obtained from the patient's wife after a thorough discussion of the procedural risks, benefits and alternatives. All questions were addressed. Maximal Sterile Barrier Technique was utilized including caps, mask, sterile gowns, sterile gloves, sterile drape, hand hygiene and skin antiseptic. A timeout was performed prior to the initiation of the procedure. The right groin was prepped and draped in the usual sterile fashion. Using a micropuncture kit and the modified Seldinger technique, access was gained to the right common femoral artery and an 8 French sheath was placed. Real-time ultrasound guidance was utilized for vascular access including the acquisition of a permanent ultrasound image documenting patency of the accessed vessel. Under fluoroscopy, an 8 [place] Walrus balloon guide catheter was navigated over a 6 [place] VTK catheter and a 0.035" Terumo Glidewire into the aortic arch. The catheter was placed into the left common carotid artery and then advanced into the left internal carotid artery. The diagnostic catheter was removed. Frontal and lateral angiograms of the head were obtained. FINDINGS: 1. Ultrasound showed  heavily calcified right common femoral artery is maintained patency and caliber. 2. Proximal occlusion of a left M2/MCA anterior division branch. 3. Atherosclerotic changes of the intracranial left ICA with mild stenosis at the distal cavernous segment. 4. 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 PROCEDURE: Using biplane roadmap guidance, a Red 62 aspiration catheter was navigated over Colossus 35 microguidewire into the cavernous segment of the left ICA. The aspiration catheter was then advanced to the level of occlusion and connected to an aspiration pump. Continuous aspiration was performed for 2 minutes. The guide catheter was connected to a VacLok syringe and the guiding catheter balloon was inflated. The aspiration catheter was subsequently removed under constant aspiration. The guide catheter was aspirated for debris. Left internal carotid artery angiograms with frontal and lateral views of the head showed complete recanalization of the left MCA vascular tree. The guide catheter was retracted into the neck. Frontal and lateral angiograms of the neck were obtained. Improvement of the degree of stenosis in the carotid bulb compared to prior CT angiogram. There is prominent luminal irregularity at the carotid bulb residual moderate stenosis. Increased tortuosity of the proximal/mid cervical left ICA with kinking. Left internal carotid artery angiograms with left anterior oblique views of the neck showed evidence of filling defects at the level of the carotid bulb. Flat panel CT of the head was obtained and post processed in a separate workstation with concurrent attending physician supervision. Selected images were sent to PACS. No evidence of hemorrhagic complication. There is mild contrast  staining of the left insular and frontal cortex. Repeat left internal carotid artery angiograms with frontal and  lateral views of the head showed Amy seen left M3/MCA branch to the left parietal region. Left common carotid artery angiograms with frontal and lateral views of the neck showed vertebra Gretchen of stenosis at the left ICA bulb with more prominent filling defect. At this point, patient was loaded on cangrelor followed by continuous drip. Using biplane roadmap guidance, a 4-7 mm Emboshield NAV6 cerebral protection device was advanced into the cervical left ICA. However, multiple attempts to advance a cerebral protection device through the left ICA kinking proved unsuccessful. The cerebral protection device was subsequently removed. Using biplane roadmap guidance, a 10-8 x 40 mm XACT carotid stent was navigated and deployed from the distal left common carotid artery to the proximal left internal carotid artery, proximal to the vessel kinking. Suboptimal stent expansion was noted. Then, a 6 x 30 mm Viatrac balloon was navigated into the recently deployed stent. Angioplasty was performed under fluoroscopy. Left internal carotid artery angiograms with frontal and lateral views of the neck showed adequate stent positioning and expansion with brisk anterograde flow. Left internal carotid artery angiograms with frontal and lateral views of the head showed improvement of anterograde flow in the left MCA vascular tree with brisk anterograde flow. Delayed left common carotid artery angiograms with frontal and lateral views of the neck showed no evidence of clot formation within the stent. The catheter was subsequently Riddle. Right common femoral artery angiogram was obtained in right anterior oblique view. The puncture is at the level of the common femoral artery. The artery has normal caliber, adequate for closure device. The sheath was exchanged over the wire for an 8 [place] Angio-Seal which was utilized for access closure. Immediate hemostasis was achieved. IMPRESSION: 1. Successful mechanical thrombectomy for treatment  of a proximal left M2/MCA anterior division branch occlusion achieving complete recanalization (TICI 3). 2. 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 PLAN: Continue cangrelor infusion until patient is transitioned to oral dual antiplatelet therapy. Electronically Signed   By: Baldemar Lenis M.D.   On: 06/05/2023 13:16   IR US Guide Vasc Access Right Result Date: 06/05/2023 INDICATION: 83 year old male presenting with right-sided weakness and aphasia; NIHSS 28. His last known well was 10 p.m. on 06/04/2023. His past medical history significant for prior stroke, hypertension, diabetes and chronic kidney disease; baseline modified Rankin scale 0. Head CT showed hypodensity within the left insula, basal ganglia and left frontal operculum (ASPECTS 7). No IV thrombolytic given as patient was outside the window. CT angiogram of the head and neck showed an occlusion of a left M2/MCA anterior division branch. CT perfusion showed a 41 mL core infarct with a 45 mL ischemic penumbra. She was transferred to our service for mechanical thrombectomy. EXAM: ULTRASOUND-GUIDED VASCULAR ACCESS DIAGNOSTIC CEREBRAL ANGIOGRAM MECHANICAL THROMBECTOMY FLAT PANEL HEAD CT LEFT CAROTID STENTING AND ANGIOPLASTY WITHOUT CEREBRAL PROTECTION DEVICE COMPARISON:  CT/CT angiogram of the head and neck June 05, 2023. MEDICATIONS: No antibiotics administered. ANESTHESIA/SEDATION: The procedure was performed under general anesthesia. CONTRAST:  80 mL of Omnipaque 300 milligram/mL FLUOROSCOPY: Radiation Exposure Index (as provided by the fluoroscopic device): 1315 mGy Kerma COMPLICATIONS: None immediate. TECHNIQUE: Informed written consent was obtained from the patient's wife after a thorough discussion of the procedural risks, benefits and alternatives. All questions were addressed. Maximal  Sterile  Barrier Technique was utilized including caps, mask, sterile gowns, sterile gloves, sterile drape, hand hygiene and skin antiseptic. A timeout was performed prior to the initiation of the procedure. The right groin was prepped and draped in the usual sterile fashion. Using a micropuncture kit and the modified Seldinger technique, access was gained to the right common femoral artery and an 8 French sheath was placed. Real-time ultrasound guidance was utilized for vascular access including the acquisition of a permanent ultrasound image documenting patency of the accessed vessel. Under fluoroscopy, an 8 [place] Walrus balloon guide catheter was navigated over a 6 [place] VTK catheter and a 0.035" Terumo Glidewire into the aortic arch. The catheter was placed into the left common carotid artery and then advanced into the left internal carotid artery. The diagnostic catheter was removed. Frontal and lateral angiograms of the head were obtained. FINDINGS: 1. Ultrasound showed heavily calcified right common femoral artery is maintained patency and caliber. 2. Proximal occlusion of a left M2/MCA anterior division branch. 3. Atherosclerotic changes of the intracranial left ICA with mild stenosis at the distal cavernous segment. 4. 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 PROCEDURE: Using biplane roadmap guidance, a Red 62 aspiration catheter was navigated over Colossus 35 microguidewire into the cavernous segment of the left ICA. The aspiration catheter was then advanced to the level of occlusion and connected to an aspiration pump. Continuous aspiration was performed for 2 minutes. The guide catheter was connected to a VacLok syringe and the guiding catheter balloon was inflated. The aspiration catheter was subsequently removed under constant aspiration. The guide catheter was aspirated for debris. Left  internal carotid artery angiograms with frontal and lateral views of the head showed complete recanalization of the left MCA vascular tree. The guide catheter was retracted into the neck. Frontal and lateral angiograms of the neck were obtained. Improvement of the degree of stenosis in the carotid bulb compared to prior CT angiogram. There is prominent luminal irregularity at the carotid bulb residual moderate stenosis. Increased tortuosity of the proximal/mid cervical left ICA with kinking. Left internal carotid artery angiograms with left anterior oblique views of the neck showed evidence of filling defects at the level of the carotid bulb. Flat panel CT of the head was obtained and post processed in a separate workstation with concurrent attending physician supervision. Selected images were sent to PACS. No evidence of hemorrhagic complication. There is mild contrast staining of the left insular and frontal cortex. Repeat left internal carotid artery angiograms with frontal and lateral views of the head showed Amy seen left M3/MCA branch to the left parietal region. Left common carotid artery angiograms with frontal and lateral views of the neck showed vertebra Gretchen of stenosis at the left ICA bulb with more prominent filling defect. At this point, patient was loaded on cangrelor followed by continuous drip. Using biplane roadmap guidance, a 4-7 mm Emboshield NAV6 cerebral protection device was advanced into the cervical left ICA. However, multiple attempts to advance a cerebral protection device through the left ICA kinking proved unsuccessful. The cerebral protection device was subsequently removed. Using biplane roadmap guidance, a 10-8 x 40 mm XACT carotid stent was navigated and deployed from the distal left common carotid artery to the proximal left internal carotid artery, proximal to the vessel kinking. Suboptimal stent expansion was noted. Then, a 6 x 30 mm Viatrac balloon was navigated into the  recently deployed stent.  Angioplasty was performed under fluoroscopy. Left internal carotid artery angiograms with frontal and lateral views of the neck showed adequate stent positioning and expansion with brisk anterograde flow. Left internal carotid artery angiograms with frontal and lateral views of the head showed improvement of anterograde flow in the left MCA vascular tree with brisk anterograde flow. Delayed left common carotid artery angiograms with frontal and lateral views of the neck showed no evidence of clot formation within the stent. The catheter was subsequently Riddle. Right common femoral artery angiogram was obtained in right anterior oblique view. The puncture is at the level of the common femoral artery. The artery has normal caliber, adequate for closure device. The sheath was exchanged over the wire for an 8 [place] Angio-Seal which was utilized for access closure. Immediate hemostasis was achieved. IMPRESSION: 1. Successful mechanical thrombectomy for treatment of a proximal left M2/MCA anterior division branch occlusion achieving complete recanalization (TICI 3). 2. 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 PLAN: Continue cangrelor infusion until patient is transitioned to oral dual antiplatelet therapy. Electronically Signed   By: Baldemar Lenis M.D.   On: 06/05/2023 13:16   IR INTRAVSC STENT CERV CAROTID W/O EMB-PROT MOD SED Result Date: 06/05/2023 INDICATION: 83 year old male presenting with right-sided weakness and aphasia; NIHSS 28. His last known well was 10 p.m. on 06/04/2023. His past medical history significant for prior stroke, hypertension, diabetes and chronic kidney disease; baseline modified Rankin scale 0. Head CT showed hypodensity within the left insula, basal ganglia and left frontal operculum (ASPECTS 7). No IV  thrombolytic given as patient was outside the window. CT angiogram of the head and neck showed an occlusion of a left M2/MCA anterior division branch. CT perfusion showed a 41 mL core infarct with a 45 mL ischemic penumbra. She was transferred to our service for mechanical thrombectomy. EXAM: ULTRASOUND-GUIDED VASCULAR ACCESS DIAGNOSTIC CEREBRAL ANGIOGRAM MECHANICAL THROMBECTOMY FLAT PANEL HEAD CT LEFT CAROTID STENTING AND ANGIOPLASTY WITHOUT CEREBRAL PROTECTION DEVICE COMPARISON:  CT/CT angiogram of the head and neck June 05, 2023. MEDICATIONS: No antibiotics administered. ANESTHESIA/SEDATION: The procedure was performed under general anesthesia. CONTRAST:  80 mL of Omnipaque 300 milligram/mL FLUOROSCOPY: Radiation Exposure Index (as provided by the fluoroscopic device): 1315 mGy Kerma COMPLICATIONS: None immediate. TECHNIQUE: Informed written consent was obtained from the patient's wife after a thorough discussion of the procedural risks, benefits and alternatives. All questions were addressed. Maximal Sterile Barrier Technique was utilized including caps, mask, sterile gowns, sterile gloves, sterile drape, hand hygiene and skin antiseptic. A timeout was performed prior to the initiation of the procedure. The right groin was prepped and draped in the usual sterile fashion. Using a micropuncture kit and the modified Seldinger technique, access was gained to the right common femoral artery and an 8 French sheath was placed. Real-time ultrasound guidance was utilized for vascular access including the acquisition of a permanent ultrasound image documenting patency of the accessed vessel. Under fluoroscopy, an 8 [place] Walrus balloon guide catheter was navigated over a 6 [place] VTK catheter and a 0.035" Terumo Glidewire into the aortic arch. The catheter was placed into the left common carotid artery and then advanced into the left internal carotid artery. The diagnostic catheter was removed. Frontal and lateral  angiograms of the head were obtained. FINDINGS: 1. Ultrasound showed heavily calcified right common femoral artery is maintained patency and caliber. 2. Proximal occlusion of a left  M2/MCA anterior division branch. 3. Atherosclerotic changes of the intracranial left ICA with mild stenosis at the distal cavernous segment. 4. 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 PROCEDURE: Using biplane roadmap guidance, a Red 62 aspiration catheter was navigated over Colossus 35 microguidewire into the cavernous segment of the left ICA. The aspiration catheter was then advanced to the level of occlusion and connected to an aspiration pump. Continuous aspiration was performed for 2 minutes. The guide catheter was connected to a VacLok syringe and the guiding catheter balloon was inflated. The aspiration catheter was subsequently removed under constant aspiration. The guide catheter was aspirated for debris. Left internal carotid artery angiograms with frontal and lateral views of the head showed complete recanalization of the left MCA vascular tree. The guide catheter was retracted into the neck. Frontal and lateral angiograms of the neck were obtained. Improvement of the degree of stenosis in the carotid bulb compared to prior CT angiogram. There is prominent luminal irregularity at the carotid bulb residual moderate stenosis. Increased tortuosity of the proximal/mid cervical left ICA with kinking. Left internal carotid artery angiograms with left anterior oblique views of the neck showed evidence of filling defects at the level of the carotid bulb. Flat panel CT of the head was obtained and post processed in a separate workstation with concurrent attending physician supervision. Selected images were sent to PACS. No evidence of hemorrhagic complication. There is mild contrast staining of the left insular and frontal  cortex. Repeat left internal carotid artery angiograms with frontal and lateral views of the head showed Amy seen left M3/MCA branch to the left parietal region. Left common carotid artery angiograms with frontal and lateral views of the neck showed vertebra Gretchen of stenosis at the left ICA bulb with more prominent filling defect. At this point, patient was loaded on cangrelor followed by continuous drip. Using biplane roadmap guidance, a 4-7 mm Emboshield NAV6 cerebral protection device was advanced into the cervical left ICA. However, multiple attempts to advance a cerebral protection device through the left ICA kinking proved unsuccessful. The cerebral protection device was subsequently removed. Using biplane roadmap guidance, a 10-8 x 40 mm XACT carotid stent was navigated and deployed from the distal left common carotid artery to the proximal left internal carotid artery, proximal to the vessel kinking. Suboptimal stent expansion was noted. Then, a 6 x 30 mm Viatrac balloon was navigated into the recently deployed stent. Angioplasty was performed under fluoroscopy. Left internal carotid artery angiograms with frontal and lateral views of the neck showed adequate stent positioning and expansion with brisk anterograde flow. Left internal carotid artery angiograms with frontal and lateral views of the head showed improvement of anterograde flow in the left MCA vascular tree with brisk anterograde flow. Delayed left common carotid artery angiograms with frontal and lateral views of the neck showed no evidence of clot formation within the stent. The catheter was subsequently Riddle. Right common femoral artery angiogram was obtained in right anterior oblique view. The puncture is at the level of the common femoral artery. The artery has normal caliber, adequate for closure device. The sheath was exchanged over the wire for an 8 [place] Angio-Seal which was utilized for access closure. Immediate hemostasis was  achieved. IMPRESSION: 1. Successful mechanical thrombectomy for treatment of a proximal left M2/MCA anterior division branch occlusion achieving complete recanalization (TICI 3). 2. Atherosclerotic disease of the left carotid bifurcation with  stenosis and clot formation suggesting acute plaque rupture treated with stenting and angioplasty with resolution of stenosis. PLAN: Continue cangrelor infusion until patient is transitioned to oral dual antiplatelet therapy. Electronically Signed   By: Baldemar Lenis M.D.   On: 06/05/2023 13:16   IR CT Head Ltd Result Date: 06/05/2023 INDICATION: 83 year old male presenting with right-sided weakness and aphasia; NIHSS 28. His last known well was 10 p.m. on 06/04/2023. His past medical history significant for prior stroke, hypertension, diabetes and chronic kidney disease; baseline modified Rankin scale 0. Head CT showed hypodensity within the left insula, basal ganglia and left frontal operculum (ASPECTS 7). No IV thrombolytic given as patient was outside the window. CT angiogram of the head and neck showed an occlusion of a left M2/MCA anterior division branch. CT perfusion showed a 41 mL core infarct with a 45 mL ischemic penumbra. She was transferred to our service for mechanical thrombectomy. EXAM: ULTRASOUND-GUIDED VASCULAR ACCESS DIAGNOSTIC CEREBRAL ANGIOGRAM MECHANICAL THROMBECTOMY FLAT PANEL HEAD CT LEFT CAROTID STENTING AND ANGIOPLASTY WITHOUT CEREBRAL PROTECTION DEVICE COMPARISON:  CT/CT angiogram of the head and neck June 05, 2023. MEDICATIONS: No antibiotics administered. ANESTHESIA/SEDATION: The procedure was performed under general anesthesia. CONTRAST:  80 mL of Omnipaque 300 milligram/mL FLUOROSCOPY: Radiation Exposure Index (as provided by the fluoroscopic device): 1315 mGy Kerma COMPLICATIONS: None immediate. TECHNIQUE: Informed written consent was obtained from the patient's wife after a thorough discussion of the procedural risks,  benefits and alternatives. All questions were addressed. Maximal Sterile Barrier Technique was utilized including caps, mask, sterile gowns, sterile gloves, sterile drape, hand hygiene and skin antiseptic. A timeout was performed prior to the initiation of the procedure. The right groin was prepped and draped in the usual sterile fashion. Using a micropuncture kit and the modified Seldinger technique, access was gained to the right common femoral artery and an 8 French sheath was placed. Real-time ultrasound guidance was utilized for vascular access including the acquisition of a permanent ultrasound image documenting patency of the accessed vessel. Under fluoroscopy, an 8 [place] Walrus balloon guide catheter was navigated over a 6 [place] VTK catheter and a 0.035" Terumo Glidewire into the aortic arch. The catheter was placed into the left common carotid artery and then advanced into the left internal carotid artery. The diagnostic catheter was removed. Frontal and lateral angiograms of the head were obtained. FINDINGS: 1. Ultrasound showed heavily calcified right common femoral artery is maintained patency and caliber. 2. Proximal occlusion of a left M2/MCA anterior division branch. 3. Atherosclerotic changes of the intracranial left ICA with mild stenosis at the distal cavernous segment. 4. 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 PROCEDURE: Using biplane roadmap guidance, a Red 62 aspiration catheter was navigated over Colossus 35 microguidewire into the cavernous segment of the left ICA. The aspiration catheter was then advanced to the level of occlusion and connected to an aspiration pump. Continuous aspiration was performed for 2 minutes. The guide catheter was connected to a VacLok syringe and the guiding catheter balloon was inflated. The aspiration catheter was subsequently removed under constant  aspiration. The guide catheter was aspirated for debris. Left internal carotid artery angiograms with frontal and lateral views of the head showed complete recanalization of the left MCA vascular tree. The guide catheter was retracted into the neck. Frontal and lateral angiograms of the neck were obtained. Improvement of the degree of stenosis in the carotid bulb  compared to prior CT angiogram. There is prominent luminal irregularity at the carotid bulb residual moderate stenosis. Increased tortuosity of the proximal/mid cervical left ICA with kinking. Left internal carotid artery angiograms with left anterior oblique views of the neck showed evidence of filling defects at the level of the carotid bulb. Flat panel CT of the head was obtained and post processed in a separate workstation with concurrent attending physician supervision. Selected images were sent to PACS. No evidence of hemorrhagic complication. There is mild contrast staining of the left insular and frontal cortex. Repeat left internal carotid artery angiograms with frontal and lateral views of the head showed Amy seen left M3/MCA branch to the left parietal region. Left common carotid artery angiograms with frontal and lateral views of the neck showed vertebra Gretchen of stenosis at the left ICA bulb with more prominent filling defect. At this point, patient was loaded on cangrelor followed by continuous drip. Using biplane roadmap guidance, a 4-7 mm Emboshield NAV6 cerebral protection device was advanced into the cervical left ICA. However, multiple attempts to advance a cerebral protection device through the left ICA kinking proved unsuccessful. The cerebral protection device was subsequently removed. Using biplane roadmap guidance, a 10-8 x 40 mm XACT carotid stent was navigated and deployed from the distal left common carotid artery to the proximal left internal carotid artery, proximal to the vessel kinking. Suboptimal stent expansion was noted.  Then, a 6 x 30 mm Viatrac balloon was navigated into the recently deployed stent. Angioplasty was performed under fluoroscopy. Left internal carotid artery angiograms with frontal and lateral views of the neck showed adequate stent positioning and expansion with brisk anterograde flow. Left internal carotid artery angiograms with frontal and lateral views of the head showed improvement of anterograde flow in the left MCA vascular tree with brisk anterograde flow. Delayed left common carotid artery angiograms with frontal and lateral views of the neck showed no evidence of clot formation within the stent. The catheter was subsequently Riddle. Right common femoral artery angiogram was obtained in right anterior oblique view. The puncture is at the level of the common femoral artery. The artery has normal caliber, adequate for closure device. The sheath was exchanged over the wire for an 8 [place] Angio-Seal which was utilized for access closure. Immediate hemostasis was achieved. IMPRESSION: 1. Successful mechanical thrombectomy for treatment of a proximal left M2/MCA anterior division branch occlusion achieving complete recanalization (TICI 3). 2. 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 PLAN: Continue cangrelor infusion until patient is transitioned to oral dual antiplatelet therapy. Electronically Signed   By: Baldemar Lenis M.D.   On: 06/05/2023 13:16   CT ANGIO HEAD NECK W WO CM W PERF (CODE STROKE) Addendum Date: 06/05/2023 ADDENDUM REPORT: 06/05/2023 12:25 ADDENDUM: Please note, there is a dictation error within CTA neck impression #1, which should read: The common carotid and internal carotid arteries are patent within the neck. Atherosclerotic plaque bilaterally. Most notably, there is progressive atherosclerotic plaque about the left carotid  bifurcation and within the proximal left ICA with resultant severe near occlusive stenosis of the proximal left ICA. Also of note, atherosclerotic plaque about the right carotid bifurcation results in a 40% stenosis at the right ICA origin. Electronically Signed   By: Jackey Loge D.O.   On: 06/05/2023 12:25   Result Date: 06/05/2023 CLINICAL DATA:  Provided history: Cerebrovascular accident, unspecified  mechanism. Right-sided weakness. Right-sided facial droop. Altered mental status. EXAM: CT ANGIOGRAPHY HEAD AND NECK CT PERFUSION BRAIN TECHNIQUE: Multidetector CT imaging of the head and neck was performed using the standard protocol during bolus administration of intravenous contrast. Multiplanar CT image reconstructions and MIPs were obtained to evaluate the vascular anatomy. Carotid stenosis measurements (when applicable) are obtained utilizing NASCET criteria, using the distal internal carotid diameter as the denominator. Multiphase CT imaging of the brain was performed following IV bolus contrast injection. Subsequent parametric perfusion maps were calculated using RAPID software. RADIATION DOSE REDUCTION: This exam was performed according to the departmental dose-optimization program which includes automated exposure control, adjustment of the mA and/or kV according to patient size and/or use of iterative reconstruction technique. CONTRAST:  OMNIPAQUE IOHEXOL 350 MG/ML SOLN COMPARISON:  Noncontrast head CT performed earlier today 06/05/2023. MRA head and MRA neck 10/04/2019. FINDINGS: CTA NECK FINDINGS Aortic arch: Common origin of the innominate and left common carotid arteries. Atherosclerotic plaque within the visualized thoracic aorta and proximal major branch vessels of the neck. Streak/beam hardening artifact arising from a dense right-sided contrast bolus partially obscures the right subclavian artery. Within this limitation, there is no appreciable hemodynamically significant innominate or  proximal subclavian artery stenosis. Right carotid system: CCA and ICA patent within the neck. Atherosclerotic plaque, greatest about the carotid bifurcation. Resultant 40% stenosis at the ICA origin. Left carotid system: CCA and ICA patent within the neck. Atherosclerotic plaque. Most notably, there is prominent atherosclerotic plaque about the carotid bifurcation and within the proximal ICA which has progressed from the prior MRA neck of 10/04/2019. Resultant severe (near occlusive) stenosis of the proximal ICA. Tortuosity of the cervical ICA Vertebral arteries: The vertebral arteries are patent within the neck. Streak/beam hardening artifact limits evaluation of the right vertebral artery origin. At least moderate stenosis is suspected at this site. Atherosclerotic plaque scattered elsewhere within the cervical right vertebral artery with no more than mild stenosis. Calcified atherosclerotic plaque at the left vertebral artery origin with suspected at least moderate stenosis. Nonstenotic calcified plaque elsewhere within the cervical left vertebral artery. Skeleton: Cervical spondylosis. Other neck: No neck mass or cervical lymphadenopathy. Upper chest: No consolidation within the imaged lung apices. Review of the MIP images confirms the above findings CTA HEAD FINDINGS Anterior circulation: The intracranial internal carotid arteries are patent. As sclerotic plaque within both vessels. No more than mild stenosis on the right. Up to moderate stenosis within the left cavernous segment. The M1 middle cerebral arteries are patent. Abrupt occlusion of a proximal M2 left middle cerebral artery vessel (series 11, image 22). Atherosclerotic irregularity of the M2 and more distal MCA vessels elsewhere. The anterior cerebral arteries are patent. Atherosclerotic irregularity of both vessels without high-grade proximal stenosis. A possible 2 mm periophthalmic left ICA aneurysm with better appreciated on the prior MRA head of  10/04/2019. Posterior circulation: The intracranial vertebral arteries are patent. Atherosclerotic plaque within the right V4 segment sites of mild stenosis. Non-stenotic atherosclerotic plaque within the left V4 segment. The basilar artery is patent. The posterior cerebral arteries are patent. Posterior communicating arteries are diminutive or absent, bilaterally. Venous sinuses: Assessment for dural venous sinus thrombosis is limited due to contrast timing. Anatomic variants: As described. Review of the MIP images confirms the above findings CT Brain Perfusion Findings: ASPECTS: CBF (<30%) Volume: 41mL Perfusion (Tmax>6.0s) volume: 86mL Mismatch Volume: 45mL Infarction Location:Left MCA vascular territory CTA head impression #1, the CT perfusion head impression and the presence of a severe  stenosis of the proximal cervical left ICA called by telephone at the time of interpretation on 06/05/2023 at 8:40 am to provider [facility] , who verbally acknowledged these results. IMPRESSION: CTA neck: 1. No common carotid and internal carotid arteries are patent within the neck. Atherosclerotic plaque bilaterally. Most notably, there is progressive atherosclerotic plaque about the left carotid bifurcation and within the proximal left ICA with resultant severe, near occlusive stenosis of the proximal left ICA. Also of note, atherosclerotic plaque about the right carotid bifurcation results in 40% stenosis at the right ICA origin. 2. The vertebral arteries are patent within the neck. Atherosclerotic plaque bilaterally as described. Most notably, there is suspected at least moderate stenoses at the bilateral vertebral artery origins. 3. Aortic Atherosclerosis (ICD10-I70.0). CTA head: 1. Abrupt occlusion of a proximal M2 left middle cerebral artery vessel. 2. Background intracranial atherosclerotic disease as described. 3. A possible 2 mm periophthalmic left ICA aneurysm was better appreciated on the prior MRA head of  10/04/2019. CT perfusion head: The perfusion software identifies a 41 mL core infarct in the left MCA vascular territory. The perfusion software identifies an 86 mL region of critically hypoperfused parenchyma within the left MCA vascular territory (utilizing the Tmax>6 seconds threshold). Reported mismatch volume: 45 mL Electronically Signed: By: Jackey Loge D.O. On: 06/05/2023 09:07   CT HEAD CODE STROKE WO CONTRAST Result Date: 06/05/2023 CLINICAL DATA:  Code stroke. Neuro deficit, acute, stroke suspected. EXAM: CT HEAD WITHOUT CONTRAST TECHNIQUE: Contiguous axial images were obtained from the base of the skull through the vertex without intravenous contrast. RADIATION DOSE REDUCTION: This exam was performed according to the departmental dose-optimization program which includes automated exposure control, adjustment of the mA and/or kV according to patient size and/or use of iterative reconstruction technique. COMPARISON:  Brain MRI 10/04/2019.  Noncontrast head CT 10/03/2019. FINDINGS: Brain: Generalized cerebral atrophy. Loss of gray-white differentiation consistent with an acute infarct within the left insula and within portions of the left frontal operculum (MCA vascular territory). Known small chronic cortically-based infarcts within the left frontal, left parietal and left occipital lobes were better appreciated on the prior brain MRI of 10/04/2019 (acute at that time). Mild patchy and ill-defined hypoattenuation within the cerebral white matter, nonspecific but compatible with chronic small vessel ischemic disease. Subcentimeter infarct within the superior right cerebellar hemisphere, new from the prior MRI but chronic in appearance. Loss of gray-white differentiation there is no acute intracranial hemorrhage. No extra-axial fluid collection. No evidence of an intracranial mass. No midline shift. Vascular: No hyperdense vessel.  Atherosclerotic calcifications. Skull: No calvarial fracture or aggressive  osseous lesion. Sinuses/Orbits: No mass or acute finding within the imaged orbits. No significant paranasal sinus disease. ASPECTS [facility] Stroke Program Early CT Score) - Ganglionic level infarction (caudate, lentiform nuclei, internal capsule, insula, M1-M3 cortex): 5 - Supraganglionic infarction (M4-M6 cortex): 2 Total score (0-10 with 10 being normal): 7 Impression #1 called by telephone at the time of interpretation on 06/05/2023 at 8:40 am to provider Dr. Otelia Limes, who verbally acknowledged these results. IMPRESSION: 1. Acute left MCA territory infarct affecting the left insula and portions of the left frontal operculum. ASPECTS is 7. 2. Known small chronic cortically-based infarcts within the left frontal, left parietal and left occipital lobes were better appreciated on the prior brain MRI of 10/04/2019 (acute at that time). 3. Background mild cerebral white matter chronic small vessel ischemic disease. 4. Subcentimeter infarct within the right cerebellar hemisphere, new from prior MRI but chronic in appearance. 5.  Generalized cerebral atrophy. Electronically Signed   By: Jackey Loge D.O.   On: 06/05/2023 08:45     PHYSICAL EXAM  Temp:  [97.7 F (36.5 C)-100.4 F (38 C)] 100.4 F (38 C) (02/15 1200) Pulse Rate:  [100-131] 128 (02/15 0900) Resp:  [18-32] 22 (02/15 0900) BP: (89-159)/(54-98) 89/70 (02/15 0900) SpO2:  [83 %-100 %] 93 % (02/15 0900) FiO2 (%):  [100 %] 100 % (02/15 0850) Weight:  [119.7 kg] 119.7 kg (02/15 0500)  General -intubated elderly patient in no acute distress  Cardiovascular -tachycardic rhythm on monitor  Neuro (sedation with propofol and fentanyl, patient seen shortly after intubation) pupils equal round and reactive, patient does not respond to voice or follow commands, oculocephalic reflex absent, will move bilateral lower extremities to noxious but does not move upper extremities   ASSESSMENT/PLAN Mr. Ashten Prats. is a 83 y.o. male with history of  hypertension, diabetes, CKD 3, stroke admitted for right-sided weakness numbness, aphasia, right facial droop, left gaze preference. No TNK given due to outside window.  Patient underwent mechanical thrombectomy with left ICA stenting.   2/15 in the morning patient had an episode of vomiting with coffee-ground emesis and subsequent tachycardia and tachypnea.  On evaluation this morning, he had increased work of breathing and was unable to maintain his SpO2 even on 100% O2 via nonrebreather.  Discussion was had with patient's wife and daughter, and family stated they would like to proceed with intubation and would like for patient to remain full code at this time.  CCM was consulted, and patient was transferred to ICU and intubated.  He did have an episode of aspiration during intubation and bronchoscopy was subsequently performed.  He was noted to be hypotensive after intubation, and Levophed and vasopressin were started.  Will hold Plavix for now given coffee-ground emesis and check P2 Y12.  Patient's creatinine was also noted to have worsened to 3.41.  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 status post IR with TICI3 and confluent HT, likely secondary to large vessel disease source versus cardiomyopathy with low EF CT left MCA infarct CT head and neck left M2 occlusion, left ICA near occlusion, right ICA 43 stenosis, bilateral VA origin severe stenosis CTP 41/86 Status post IR with TICI3 and left ICA stenting MRI left MCA infarct at left insular and left frontal operculum, prominent and confluent petechial hemorrhagic transformation CT repeat 2/7 stable confluent petechial hemorrhage 2D Echo EF 30% LDL 107 HgbA1c 6.1 P2 Y12 pending UDS negative Heparin subcu for VTE prophylaxis aspirin 81 mg daily and clopidogrel 75 mg daily prior to admission, now on aspirin with Plavix held given GI bleed Patient will be counseled to be compliant with his antithrombotic  medications Ongoing aggressive stroke risk factor management Therapy recommendations:  SNF Disposition: Pending  History of stroke 10/08/2019 admitted for left MCA infarct due to right upper extremity weakness.  MRA head and neck showed left ICA 30% stenosis.  EF 45 to 50%.  LDL 105, A1c 5.6.  Recommended loop recorder at that time but only got 30-day CardioNet monitoring which was no A-fib.  Discharged on DAPT and Lipitor 80.  Respiratory failure On 2/15, patient had episode of emesis with probable subsequent aspiration and went into respiratory distress with increased work of breathing unable to maintain SpO2 After discussion with family, patient intubated for airway protection and given respiratory failure Ventilator management per CCM Extubate when able  GI bleed Patient had  2 episodes of coffee-ground emesis Hold Plavix for now pending P2 Y12 level Hemoglobin 11.2  Cardiomyopathy CHF 09/2019 EF 45 to 50% Current admission EF 30% Cardiology on board, appreciate Dr. Rennis Golden assistance On DAPT Agree with GDMT - would like to start losartan, entresto, and spironolactone as BP and Cr tolerates Metoprolol succinate 25mg  daily Lasix 80mg  x1 May consider loop recorder placement to rule out A-fib  Diabetes HgbA1c 6.1 goal < 7.0 Controlled CBG monitoring SSI.  Add Semglee 3 units daily and continue to monitor DM education and close PCP follow up  History of hypertension, now hypotensive Unstable, requiring Levophed and vasopressin BP goal less than 160 given hemorrhagic transformation Long term BP goal normotensive  Hyperlipidemia Home meds: Lipitor 80 LDL 105, goal < 70 Now on lipitor 80 and Zetia Continue statin and at discharge  Dysphagia Poststroke dysphagia Speech on board N.p.o. for now given intubation, OG tube reinserted  AKI on CKD, worsened Creatinine 3.41 Baseline creatinine around 1.5 Renally dose medications as appropriate Avoid contrast  Other Stroke  Risk Factors Advanced age Obesity, Body mass index is 37.86 kg/m.   Other Active Problems Hemoglobin 9.3 >9.7-> 10.0 Presyncopal episode with mild hypotension 2/14-suspect was due to straining to have a bowel movement, will prescribe as needed suppository  Hospital day # 10  Patient seen by NP with MD, MD to edit note as needed.  Cortney E Ernestina Columbia ,   [facility] See Amion for schedule and pager information 06/15/2023 12:20 PM   STROKE MD NOTE :  I have personally obtained history,examined this patient, reviewed notes, independently viewed imaging studies, participated in medical decision making and plan of care.ROS completed by me personally and pertinent positives fully documented  I have made any additions or clarifications directly to the above note. Agree with note above.  Patient medical condition unfortunately has declined this morning with patient having episode of coffee-ground vomitus and aspirating developing refractory respiratory failure requiring transfer back to ICU with emergent intubation.  Patient remains in likely septic and cardiogenic shock requiring hemodynamic support for hypotension, ventilatory support for respiratory failure and antibiotics.  Prognosis is guarded.  Long discussion at the bedside with the patient's wife and daughter and answered questions about his prognosis and treatment plan.  Wife wants full support for now.  Discussed with Dr. Everardo All critical care medicine. This patient is critically ill and at significant risk of neurological worsening, death and care requires constant monitoring of vital signs, hemodynamics,respiratory and cardiac monitoring, extensive review of multiple databases, frequent neurological assessment, discussion with family, other specialists and medical decision making of high complexity.I have made any additions or clarifications directly to the above note.This critical care time does not reflect procedure time, or  teaching time or supervisory time of PA/NP/Med Resident etc but could involve care discussion time.  I spent 40 minutes of neurocritical care time  in the care of  this patient.     Delia Heady, MD Medical Director [facility] Pager: [phone] 06/15/2023 3:58 PM

## 2023-06-15 NOTE — Procedures (Signed)
 Arterial Catheter Insertion Procedure Note  Catarino Vold  [id number]  01-01-1941  Date:06/15/23  Time:11:10 PM    Provider Performing: Patrici Ranks    Procedure: Insertion of Arterial Line (40981) with US guidance (19147)   Indication(s) Blood pressure monitoring and/or need for frequent ABGs  Consent Unable to obtain consent due to emergent nature of procedure.  Anesthesia None   Time Out Verified patient identification, verified procedure, site/side was marked, verified correct patient position, special equipment/implants available, medications/allergies/relevant history reviewed, required imaging and test results available.   Sterile Technique Maximal sterile technique including full sterile barrier drape, hand hygiene, sterile gown, sterile gloves, mask, hair covering, sterile ultrasound probe cover (if used).   Procedure Description Area of catheter insertion was cleaned with chlorhexidine and draped in sterile fashion. With real-time ultrasound guidance an arterial catheter was placed into the left radial artery.  Appropriate arterial tracings confirmed on monitor.     Complications/Tolerance None; patient tolerated the procedure well.   EBL Minimal   Specimen(s) None

## 2023-06-15 NOTE — Progress Notes (Signed)
 In the setting of laying flat for patient care, patient had an emesis event with concerns for coffee ground emesis. He desatted immediately post-emesis to the low 80s but is now in the mid 90s on 4L [place]. Concern for aspiration. Will get chest Xray and H&H.  Ritta Slot, MD [facility] [phone]  If 7pm- 7am, please page neurology on call as listed in AMION.

## 2023-06-15 NOTE — Procedures (Signed)
 Intubation Procedure Note  Alie Hardgrove  [id number]  [birth year]  Date:06/15/23  Time:9:06 AM   Provider Performing:Marquavious Nazar Mechele Collin, MD   Procedure: Intubation (31500)  Indication(s) Respiratory Failure  Consent Risks of the procedure as well as the alternatives and risks of each were explained to the patient and/or caregiver.  Consent for the procedure was obtained and is signed in the bedside chart   Anesthesia Etomidate, Versed, and Fentanyl   Time Out Verified patient identification, verified procedure, site/side was marked, verified correct patient position, special equipment/implants available, medications/allergies/relevant history reviewed, required imaging and test results available.   Sterile Technique Usual hand hygeine, masks, and gloves were used   Procedure Description Patient positioned in bed supine.  Sedation given as noted above.  Patient was intubated with endotracheal tube using Glidescope.  View was Grade 1 full glottis .  Number of attempts was 1.  Colorimetric CO2 detector was consistent with tracheal placement.   Complications/Tolerance None; patient tolerated the procedure well. Hypotension requiring neo 480 mcg and initiation of levophed Chest X-ray is ordered to verify placement.   EBL Minimal   Specimen(s) None

## 2023-06-15 NOTE — Progress Notes (Signed)
 NAME:  Nathan Gahan., MRN:  [id number], DOB:  Oct 05, 1940, LOS: 10 ADMISSION DATE:  06/05/2023 CONSULTATION DATE:  06/05/2023 REFERRING MD:  Otelia Limes - Neuro, CHIEF COMPLAINT: Code Stroke, post-NIR   History of Present Illness:  83 year old man who presented to [facility] ED 2/5 via EMS for unresponsiveness and R-sided deficits. PMHx significant for HTN, HLD, prior CVA without residual deficits, T2DM, CKD stage IIIb, prostate CA, gout.  Patient presented to [facility] ED with significant R-sided deficits, aphasia and poor responsiveness. LKW 2200. Code Stroke initiated. On ED arrival, patient was afebrile with HR 86, BP 154/67, RR 20, SpO2 100%. Noted aphasia, R-sided hemiplegia, R-sided facial droop. Labs were notable for WBC 6.8. Hgb 11.9, Plt 181. INR 1.0. Na 142, K 3.8, CO2 20, Cr 1.59 (baseline), LFTs WNL. Ethanol < 10. COVID negative. CT Head with acute L MCA territory infarct affecting L insula/L frontal operculum, small chronic cortically-based infarcts of L frontal/L parietal/L occipital lobes. CTA Head/Neck with abrupt occlusion of proximal M2 L MCA vessel, ?2mm periophthalmic L ICA aneurysm, bilateral ICA plaque with progressive atherosclerotic plaque L carotid bifurcation with proximal L ICA severe, near-occlusive stenosis.   Taken to [facility] emergently for mechanical thrombectomy ([name]). TICI 3 revascularization achieved. L ICA stenosis and plaque rupture noted at the bulb, treated with stenting/angioplasty. Extubated post-procedure and maintained on cangrelor gtt with plan for transition to DAPT.  PCCM consulted for post-procedure medical management.  Pertinent Medical History:   Past Medical History:  Diagnosis Date   Anemia of chronic disease    Ankle fracture 02/15/2016   Cancer (HCC)    Prostate   Chronic constipation    Chronic kidney disease    stage III   Diabetes mellitus without complication (HCC)    type II    Diabetic peripheral neuropathy (HCC)    Failure to  thrive (0-17)    Fracture of left lower leg    Gout    Hyperlipidemia    Hypertension    Morbid obesity (HCC)    Unstable gait    Significant Hospital Events: Including procedures, antibiotic start and stop dates in addition to other pertinent events   2/5 - Presented to [facility] ED with poor responsiveness, aphasia, R-sided deficits. CT Head with L MCA territory infarct. CTA Head/Neck with occlusion of L MCA M2 segment and severe near-occlusive stenosis of L ICA. Taken to [facility] for MT, stent and angioplasty ([name]). PCCM consulted for post-procedure management. 2/6 transfer out of ICU 2/15 support aspiration event early a.m. resulting in respiratory distress, transferred back to ICU and emergently intubated  Interim History / Subjective:  Seen sitting up in bed on 3 W. in acute respiratory distress post aspiration.  Daughter updated at bedside with confirmation of full code  Objective:  Blood pressure (!) 101/54, pulse (!) 131, temperature 97.7 F (36.5 C), temperature source Oral, resp. rate (!) 32, height 5\' 10"  (1.778 m), weight 119.7 kg, SpO2 (!) 83%.        Intake/Output Summary (Last 24 hours) at 06/15/2023 0838 Last data filed at 06/14/2023 1400 Gross per 24 hour  Intake --  Output 500 ml  Net -500 ml   Filed Weights   06/13/23 0500 06/14/23 0414 06/15/23 0500  Weight: 123 kg 122.5 kg 119.7 kg   Physical Examination: General: Acute on chronic ill-appearing deconditioned elderly male sitting up in bed in respiratory stress HEENT: [place]/AT, MM pink/moist, PERRL,  Neuro: Alert oriented to self only CV: s1s2 regular rate  and rhythm, no murmur, rubs, or gallops,  PULM: Tachypnea, increased work of breathing, both nonrebreather and high flow nasal cannula GI: soft, bowel sounds active in all 4 quadrants, non-tender, non-distended Extremities: warm/dry, no edema  Skin: no rashes or lesions   Resolved Hospital Problem List:    Assessment & Plan:  Acute Hypoxic  Respiratory Failure  -Secondary to aspiration event  P: Intubate her emergently on arrival to ICU Continue ventilator support with lung protective strategies  Wean PEEP and FiO2 for sats greater than 90%. Head of bed elevated 30 degrees. Plateau pressures less than 30 cm H20.  Follow intermittent chest x-ray and ABG.   SAT/SBT as tolerated, mentation preclude extubation  Ensure adequate pulmonary hygiene  Follow cultures  VAP bundle in place  PAD protocol Empiric Unasyn Follow sputum culture  Coffee-ground emesis with concern for acute GI bleed -Patient vomited coffee-ground emesis with copious secretions suctioned during intubation -Hemoglobin currently stable at 11.2 platelets drastically increased from 187 2/11-3 17 2/15 indicating likely hand" P: Hold aspirin and Plavix Consider GI consult  Place OG to suction Twice daily PPI Type and screen  Maintain IV access Pressors as below   Undifferentiated shock -Likely multifactorial including potential sepsis in the setting of aspiration event and poor hypovolemic due to GI bleed P: Transfer to ICU Vent support as above  Empiric Unasyn Follow cultures Peripheral Levophed for MAP goal greater than 65 Trend lactic acid urine output  Left MCA M2 occlusion and left ICA near occlusion now s/p thrombectomy with stent angioplasty -Likely secondary to large vessel disease versus cardiomyopathy with low EF P: Primary management per stroke Management per neurology  Maintain neuro protective measures Nutrition and bowel regiment  Seizure precautions  AEDs per neurology  Aspirations precautions  Continue aspirin and Plavix  HFrEF -Echo 06/05/2023 with a EF 30% with global hypokinesis, normal RV function Hypertension HLD P: Continuous telemetry  Hold ASA and Plavix given concern for GI bleed  Strict intake and output  Daily weight to assess volume status Daily assessment for need to diurese Closely monitor renal function  and electrolytes  Vent support as above Ensure hemodynamic control   Type 2 diabetes with hyperglycemia  P: Continue SSI  CBG goal 140-180 CBG check q4  Continue long acting insulin, might need to increase   Acute kidney injury superimposed on CKD stage IIIb -Creatinine drastically increased to 3.11 GFR 17 a.m. 2/15 Hyperkalemia  P: Follow renal function  Monitor urine output Trend Bmet Avoid nephrotoxins Ensure adequate renal perfusion  IV hydration  Best Practice (right click and "Reselect all SmartList Selections" daily)   Diet/type: NPO DVT prophylaxis SCD Pressure ulcer(s): N/A GI prophylaxis: PPI Lines: N/A Foley:  Yes, and it is still needed Code Status:  full code Last date of multidisciplinary goals of care discussion: Continue to update patient and family daily   Critical care time: N/A  CRITICAL CARE Performed by: Jenayah Antu D. Harris   Total critical care time: 45 minutes  Critical care time was exclusive of separately billable procedures and treating other patients.  Critical care was necessary to treat or prevent imminent or life-threatening deterioration.  Critical care was time spent personally by me on the following activities: development of treatment plan with patient and/or surrogate as well as nursing, discussions with consultants, evaluation of patient's response to treatment, examination of patient, obtaining history from patient or surrogate, ordering and performing treatments and interventions, ordering and review of laboratory studies, ordering and review of radiographic  studies, pulse oximetry and re-evaluation of patient's condition.  Ramsie Ostrander D. Harris, NP-C [facility] Personal contact information can be found on Amion  If no contact or response made please call [phone] 06/15/2023, 9:04 AM

## 2023-06-15 NOTE — Progress Notes (Signed)
 eLink Physician-Brief Progress Note Patient Name: Nathan Proctor. DOB: July 02, 1940 MRN: [id number]   Date of Service  06/15/2023  HPI/Events of Note  Continues to have significant vasopressor need.  On epinephrine, norepinephrine, and vasopressin.  If pressors interrupted to blood draws bp will drop.  RT to attempt arterial line.    eICU Interventions  If RT unable to place arterial line will touch base with ground team     Intervention Category Major Interventions: Shock - evaluation and management  Henry Russel, P 06/15/2023, 8:27 PM

## 2023-06-15 NOTE — Progress Notes (Signed)
 RT assisted Dr. Everardo All with intubation and bedside bronchoscopy without complication. Specimen obtained and to be sent to lab.

## 2023-06-15 NOTE — Progress Notes (Signed)
 Patient's respiratory pattern is not good, looks distressed, is tachypnoec, HR in  130s, RR in 30s, making huis MEWS Red, rapid response nurse, NP from neurology at bedside, RT at bedside, keeping him on Non-rebreather mask @15  lt oxygen, still not maintaining saturation (Spo2<90)  0845: Patient transferred to ICU, bed 17, report given to primary Nurse

## 2023-06-15 NOTE — Significant Event (Signed)
 Rapid Response Event Note   Reason for Call :  Respiratory distress, coffee ground emesis  Initial Focused Assessment:  Patient is alert but unable to speak more than one word at a time because of respiratory distress.  Lung sounds coarse, decreased bases.  Increased work of breathing.  He denies any pain.  BP 159/83  HR 120-130  RR 32-36  O2 sat 86% on 4L [place]  Interventions:  Placed on NRB  O2 sats 86-89% Placed 2nd source of O2, HFNC at 15L [place]  O2 sats 92% He remains tachypneic and with increased work of breathing.  Marisue Ivan NP with stroke team at bedside speaking to family CCM consulted, Whitney NP at bedside to assess patient  Transported to 4N17    Plan of Care:  Urgent intubation   Event Summary:   MD Notified: Stanton Kidney NP, CCM Call Time: [phone] Arrival Time: 0754 End Time: 9562  Marcellina Millin, RN

## 2023-06-15 NOTE — Progress Notes (Signed)
 Pharmacy Antibiotic Note  Nathan Proctor. is a 83 y.o. male admitted on 06/05/2023 with sepsis.  Pharmacy has been consulted for vanomycin, cefepime dosing.  Plan: Cefepime 2g IV q 24h Vancomycin 2000mg  IV x1 then vancomycin 500 mg IV q 24h (eAUC 461) -goal trough 15-23mcg/mL, AUC 400-600, levels PRN F/u cultures, renal func, LOT  Height: 5\' 10"  (177.8 cm) Weight: 119.7 kg (263 lb 14.3 oz) IBW/kg (Calculated) : 73  Temp (24hrs), Avg:98.8 F (37.1 C), Min:97.7 F (36.5 C), Max:100.4 F (38 C)  Recent Labs  Lab 06/09/23 0720 06/10/23 0613 06/11/23 0544 06/15/23 0625 06/15/23 1101  WBC 7.8 8.2 8.1 10.1 26.5*  CREATININE 1.87* 1.78* 1.81* 3.41*  --   LATICACIDVEN  --   --   --   --  4.9*    Estimated Creatinine Clearance: 20.9 mL/min (A) (by C-G formula based on SCr of 3.41 mg/dL (H)).    No Known Allergies  Antimicrobials this admission: Unasyn 2/15 x1 Vanc 2/15> Cefepime 2/15>  Dose adjustments this admission:   Microbiology results: 2/15 BAL 2/15 MRSA 2/05: COVID neg   Thank you for allowing pharmacy to be a part of this patient's care.  Calton Dach, PharmD, BCCCP Clinical Pharmacist 06/15/2023 2:04 PM

## 2023-06-15 NOTE — Progress Notes (Signed)
 Arterial line placement attempted by RT x2 without success. CCM notified and will be by to place.

## 2023-06-15 NOTE — Progress Notes (Signed)
 RT attempted to place arterial line x2 without success, RN aware, MD notified.

## 2023-06-15 NOTE — Procedures (Signed)
 Bronchoscopy Procedure Note  Nathan Proctor  [id number]  02-09-1941  Date:06/15/23  Time:9:08 AM   Provider Performing:Taneesha Edgin Mechele Collin, MD  Procedure(s):  Flexible bronchoscopy with bronchial alveolar lavage [phone]  Indication(s) Aspiration  Consent Unable to obtain consent due to emergent nature of procedure.  Anesthesia Fentanyl   Time Out Verified patient identification, verified procedure, site/side was marked, verified correct patient position, special equipment/implants available, medications/allergies/relevant history reviewed, required imaging and test results available.   Sterile Technique Usual hand hygiene, masks, gowns, and gloves were used   Procedure Description Bronchoscope advanced through endotracheal tube and into airway.  Airways were examined down to subsegmental level with findings noted below.   Following diagnostic evaluation, BAL(s) performed in 60cc with normal saline and return of 7 cc fluid  Findings: Normal anatomy, excessive dynamic airway collapse >50% throughout airway, coffee ground emesis scattered in airways bilaterally R>L. Debris suctioned and cleared from airway. Lavaged airway with 20 cc saline flushes x 6 cc with suction to remove debris. Minimal evidence of coffee ground debris prior to removal of bronchoscope   Complications/Tolerance None; patient tolerated the procedure well. Chest X-ray is not needed post procedure.   EBL Minimal   Specimen(s) Respiratory culture

## 2023-06-15 NOTE — Progress Notes (Signed)
 Echocardiogram 2D Echocardiogram has been performed.  Warren Lacy Barre Aydelott RDCS 06/15/2023, 2:35 PM

## 2023-06-15 NOTE — Progress Notes (Signed)
 eLink Physician-Brief Progress Note Patient Name: Nathan Proctor. DOB: 1940-06-06 MRN: [id number]   Date of Service  06/15/2023  HPI/Events of Note  8-10 beat run of Vtach  eICU Interventions  Draw metabolic panel now  Attempt to place arterial line     Intervention Category Intermediate Interventions: Arrhythmia - evaluation and management  Henry Russel, P 06/15/2023, 9:55 PM

## 2023-06-15 NOTE — Plan of Care (Signed)
  Problem: Education: Goal: Knowledge of disease or condition will improve Outcome: Progressing Goal: Knowledge of secondary prevention will improve (MUST DOCUMENT ALL) Outcome: Progressing Goal: Knowledge of patient specific risk factors will improve (DELETE if not current risk factor) Outcome: Progressing   Problem: Ischemic Stroke/TIA Tissue Perfusion: Goal: Complications of ischemic stroke/TIA will be minimized Outcome: Progressing   Problem: Coping: Goal: Will verbalize positive feelings about self Outcome: Progressing Goal: Will identify appropriate support needs Outcome: Progressing   Problem: Health Behavior/Discharge Planning: Goal: Ability to manage health-related needs will improve Outcome: Progressing Goal: Goals will be collaboratively established with patient/family Outcome: Progressing   Problem: Self-Care: Goal: Ability to participate in self-care as condition permits will improve Outcome: Progressing Goal: Verbalization of feelings and concerns over difficulty with self-care will improve Outcome: Progressing Goal: Ability to communicate needs accurately will improve Outcome: Progressing   Problem: Nutrition: Goal: Risk of aspiration will decrease Outcome: Progressing Goal: Dietary intake will improve Outcome: Progressing   Problem: Education: Goal: Understanding of CV disease, CV risk reduction, and recovery process will improve Outcome: Progressing Goal: Individualized Educational Video(s) Outcome: Progressing   Problem: Activity: Goal: Ability to return to baseline activity level will improve Outcome: Progressing   Problem: Cardiovascular: Goal: Ability to achieve and maintain adequate cardiovascular perfusion will improve Outcome: Progressing Goal: Vascular access site(s) Level 0-1 will be maintained Outcome: Progressing   Problem: Health Behavior/Discharge Planning: Goal: Ability to safely manage health-related needs after discharge will  improve Outcome: Progressing   Problem: Health Behavior/Discharge Planning: Goal: Ability to manage health-related needs will improve Outcome: Progressing   Problem: Clinical Measurements: Goal: Ability to maintain clinical measurements within normal limits will improve Outcome: Progressing Goal: Will remain free from infection Outcome: Progressing Goal: Diagnostic test results will improve Outcome: Progressing Goal: Cardiovascular complication will be avoided Outcome: Progressing   Problem: Activity: Goal: Risk for activity intolerance will decrease Outcome: Progressing   Problem: Nutrition: Goal: Adequate nutrition will be maintained Outcome: Progressing   Problem: Coping: Goal: Level of anxiety will decrease Outcome: Progressing   Problem: Elimination: Goal: Will not experience complications related to bowel motility Outcome: Progressing Goal: Will not experience complications related to urinary retention Outcome: Progressing   Problem: Pain Managment: Goal: General experience of comfort will improve and/or be controlled Outcome: Progressing   Problem: Safety: Goal: Ability to remain free from injury will improve Outcome: Progressing   Problem: Skin Integrity: Goal: Risk for impaired skin integrity will decrease Outcome: Progressing   Problem: Education: Goal: Ability to describe self-care measures that may prevent or decrease complications (Diabetes Survival Skills Education) will improve Outcome: Progressing Goal: Individualized Educational Video(s) Outcome: Progressing   Problem: Coping: Goal: Ability to adjust to condition or change in health will improve Outcome: Progressing   Problem: Fluid Volume: Goal: Ability to maintain a balanced intake and output will improve Outcome: Progressing   Problem: Health Behavior/Discharge Planning: Goal: Ability to identify and utilize available resources and services will improve Outcome: Progressing Goal:  Ability to manage health-related needs will improve Outcome: Progressing   Problem: Metabolic: Goal: Ability to maintain appropriate glucose levels will improve Outcome: Progressing

## 2023-06-16 ENCOUNTER — Inpatient Hospital Stay (HOSPITAL_COMMUNITY): Payer: No Typology Code available for payment source

## 2023-06-16 DIAGNOSIS — J9601 Acute respiratory failure with hypoxia: Secondary | ICD-10-CM | POA: Diagnosis not present

## 2023-06-16 DIAGNOSIS — I639 Cerebral infarction, unspecified: Secondary | ICD-10-CM | POA: Diagnosis not present

## 2023-06-16 DIAGNOSIS — A419 Sepsis, unspecified organism: Secondary | ICD-10-CM

## 2023-06-16 DIAGNOSIS — K920 Hematemesis: Secondary | ICD-10-CM | POA: Diagnosis not present

## 2023-06-16 DIAGNOSIS — R6521 Severe sepsis with septic shock: Secondary | ICD-10-CM

## 2023-06-16 DIAGNOSIS — N179 Acute kidney failure, unspecified: Secondary | ICD-10-CM | POA: Diagnosis not present

## 2023-06-16 LAB — CBC
HCT: 28.3 % — ABNORMAL LOW (ref 39.0–52.0)
HCT: 26.5 % — ABNORMAL LOW (ref 39.0–52.0)
Hemoglobin: 8.8 g/dL — ABNORMAL LOW (ref 13.0–17.0)
Hemoglobin: 8.2 g/dL — ABNORMAL LOW (ref 13.0–17.0)
MCH: 31.1 pg (ref 26.0–34.0)
MCH: 30.8 pg (ref 26.0–34.0)
MCHC: 31.1 g/dL (ref 30.0–36.0)
MCHC: 30.9 g/dL (ref 30.0–36.0)
MCV: 100 fL (ref 80.0–100.0)
MCV: 99.6 fL (ref 80.0–100.0)
Platelets: 219 10*3/uL (ref 150–400)
Platelets: 167 10*3/uL (ref 150–400)
RBC: 2.83 MIL/uL — ABNORMAL LOW (ref 4.22–5.81)
RBC: 2.66 MIL/uL — ABNORMAL LOW (ref 4.22–5.81)
RDW: 14.5 % (ref 11.5–15.5)
RDW: 14.5 % (ref 11.5–15.5)
WBC: 41.4 10*3/uL — ABNORMAL HIGH (ref 4.0–10.5)
WBC: 33.5 10*3/uL — ABNORMAL HIGH (ref 4.0–10.5)
nRBC: 0 % (ref 0.0–0.2)
nRBC: 0 % (ref 0.0–0.2)

## 2023-06-16 LAB — GLUCOSE, CAPILLARY
Glucose-Capillary: 134 mg/dL — ABNORMAL HIGH (ref 70–99)
Glucose-Capillary: 136 mg/dL — ABNORMAL HIGH (ref 70–99)
Glucose-Capillary: 141 mg/dL — ABNORMAL HIGH (ref 70–99)
Glucose-Capillary: 143 mg/dL — ABNORMAL HIGH (ref 70–99)
Glucose-Capillary: 143 mg/dL — ABNORMAL HIGH (ref 70–99)
Glucose-Capillary: 143 mg/dL — ABNORMAL HIGH (ref 70–99)
Glucose-Capillary: 146 mg/dL — ABNORMAL HIGH (ref 70–99)
Glucose-Capillary: 146 mg/dL — ABNORMAL HIGH (ref 70–99)
Glucose-Capillary: 149 mg/dL — ABNORMAL HIGH (ref 70–99)
Glucose-Capillary: 152 mg/dL — ABNORMAL HIGH (ref 70–99)
Glucose-Capillary: 157 mg/dL — ABNORMAL HIGH (ref 70–99)
Glucose-Capillary: 160 mg/dL — ABNORMAL HIGH (ref 70–99)
Glucose-Capillary: 161 mg/dL — ABNORMAL HIGH (ref 70–99)
Glucose-Capillary: 164 mg/dL — ABNORMAL HIGH (ref 70–99)
Glucose-Capillary: 166 mg/dL — ABNORMAL HIGH (ref 70–99)
Glucose-Capillary: 173 mg/dL — ABNORMAL HIGH (ref 70–99)
Glucose-Capillary: 177 mg/dL — ABNORMAL HIGH (ref 70–99)

## 2023-06-16 LAB — TYPE AND SCREEN
ABO/RH(D): A NEG
Antibody Screen: NEGATIVE
Unit division: 0

## 2023-06-16 LAB — BASIC METABOLIC PANEL
Anion gap: 12 (ref 5–15)
BUN: 66 mg/dL — ABNORMAL HIGH (ref 8–23)
CO2: 22 mmol/L (ref 22–32)
Calcium: 8.2 mg/dL — ABNORMAL LOW (ref 8.9–10.3)
Chloride: 108 mmol/L (ref 98–111)
Creatinine, Ser: 4.14 mg/dL — ABNORMAL HIGH (ref 0.61–1.24)
GFR, Estimated: 13 mL/min — ABNORMAL LOW (ref >60)
Glucose, Bld: 162 mg/dL — ABNORMAL HIGH (ref 70–99)
Potassium: 4.6 mmol/L (ref 3.5–5.1)
Sodium: 142 mmol/L (ref 135–145)

## 2023-06-16 LAB — COMPREHENSIVE METABOLIC PANEL
ALT: 30 U/L (ref 0–44)
AST: 44 U/L — ABNORMAL HIGH (ref 15–41)
Albumin: 2.5 g/dL — ABNORMAL LOW (ref 3.5–5.0)
Alkaline Phosphatase: 45 U/L (ref 38–126)
Anion gap: 14 (ref 5–15)
BUN: 68 mg/dL — ABNORMAL HIGH (ref 8–23)
CO2: 20 mmol/L — ABNORMAL LOW (ref 22–32)
Calcium: 8.4 mg/dL — ABNORMAL LOW (ref 8.9–10.3)
Chloride: 107 mmol/L (ref 98–111)
Creatinine, Ser: 4.61 mg/dL — ABNORMAL HIGH (ref 0.61–1.24)
GFR, Estimated: 12 mL/min — ABNORMAL LOW (ref >60)
Glucose, Bld: 180 mg/dL — ABNORMAL HIGH (ref 70–99)
Potassium: 5.3 mmol/L — ABNORMAL HIGH (ref 3.5–5.1)
Sodium: 141 mmol/L (ref 135–145)
Total Bilirubin: 1.1 mg/dL (ref 0.0–1.2)
Total Protein: 5.5 g/dL — ABNORMAL LOW (ref 6.5–8.1)

## 2023-06-16 LAB — LACTIC ACID, PLASMA: Lactic Acid, Venous: 1.1 mmol/L (ref 0.5–1.9)

## 2023-06-16 LAB — COOXEMETRY PANEL
Carboxyhemoglobin: 1.1 % (ref 0.5–1.5)
Methemoglobin: 0.8 % (ref 0.0–1.5)
O2 Saturation: 99.2 %
Total hemoglobin: 9.5 g/dL — ABNORMAL LOW (ref 12.0–16.0)

## 2023-06-16 LAB — BPAM RBC
Blood Product Expiration Date: 202502232359
ISSUE DATE / TIME: 202502151609
Unit Type and Rh: 600

## 2023-06-16 LAB — TRIGLYCERIDES: Triglycerides: 97 mg/dL (ref <150)

## 2023-06-16 LAB — MAGNESIUM: Magnesium: 2.1 mg/dL (ref 1.7–2.4)

## 2023-06-16 MED ORDER — SODIUM ZIRCONIUM CYCLOSILICATE 10 G PO PACK
10.0000 g | PACK | Freq: Two times a day (BID) | ORAL | Status: DC
  Administered 2023-06-16 – 2023-06-17 (×3): 10 g
  Filled 2023-06-16 (×3): qty 1

## 2023-06-16 MED ORDER — CLOPIDOGREL BISULFATE 75 MG PO TABS
75.0000 mg | ORAL_TABLET | Freq: Every evening | ORAL | Status: DC
  Administered 2023-06-17: 75 mg
  Filled 2023-06-16: qty 1

## 2023-06-16 MED ORDER — VANCOMYCIN VARIABLE DOSE PER UNSTABLE RENAL FUNCTION (PHARMACIST DOSING): Status: DC

## 2023-06-16 MED ORDER — SENNOSIDES-DOCUSATE SODIUM 8.6-50 MG PO TABS
1.0000 | ORAL_TABLET | Freq: Every evening | ORAL | Status: DC | PRN
  Administered 2023-06-17 – 2023-06-18 (×2): 1
  Filled 2023-06-16 (×2): qty 1

## 2023-06-16 MED ORDER — CLOPIDOGREL BISULFATE 75 MG PO TABS: 75.0000 mg | ORAL_TABLET | Freq: Every evening | ORAL | Status: DC

## 2023-06-16 MED ORDER — POLYETHYLENE GLYCOL 3350 17 G PO PACK
17.0000 g | PACK | Freq: Every day | ORAL | Status: DC | PRN
  Administered 2023-06-16 – 2023-06-17 (×2): 17 g
  Filled 2023-06-16 (×3): qty 1

## 2023-06-16 NOTE — Plan of Care (Signed)
 Patient arrived to 4NICU yesterday requiring intubation, and pressor support. Patient is improving today, and diagnostic results are beginning to trend in the right direction. Pressor requirement has decreased, and goals of care have been established, as well as plan of care.  Mammie Russian, RN

## 2023-06-16 NOTE — Progress Notes (Signed)
 NAME:  Nathan Almas., MRN:  [id number], DOB:  1941-04-03, LOS: 11 ADMISSION DATE:  06/05/2023 CONSULTATION DATE:  06/05/2023 REFERRING MD:  Otelia Limes - Neuro, CHIEF COMPLAINT: Code Stroke, post-NIR   History of Present Illness:  83 year old man who presented to [facility] ED 2/5 via EMS for unresponsiveness and R-sided deficits. PMHx significant for HTN, HLD, prior CVA without residual deficits, T2DM, CKD stage IIIb, prostate CA, gout.  Patient presented to [facility] ED with significant R-sided deficits, aphasia and poor responsiveness. LKW 2200. Code Stroke initiated. On ED arrival, patient was afebrile with HR 86, BP 154/67, RR 20, SpO2 100%. Noted aphasia, R-sided hemiplegia, R-sided facial droop. Labs were notable for WBC 6.8. Hgb 11.9, Plt 181. INR 1.0. Na 142, K 3.8, CO2 20, Cr 1.59 (baseline), LFTs WNL. Ethanol < 10. COVID negative. CT Head with acute L MCA territory infarct affecting L insula/L frontal operculum, small chronic cortically-based infarcts of L frontal/L parietal/L occipital lobes. CTA Head/Neck with abrupt occlusion of proximal M2 L MCA vessel, ?2mm periophthalmic L ICA aneurysm, bilateral ICA plaque with progressive atherosclerotic plaque L carotid bifurcation with proximal L ICA severe, near-occlusive stenosis.   Taken to [facility] emergently for mechanical thrombectomy ([name]). TICI 3 revascularization achieved. L ICA stenosis and plaque rupture noted at the bulb, treated with stenting/angioplasty. Extubated post-procedure and maintained on cangrelor gtt with plan for transition to DAPT.  PCCM consulted for post-procedure medical management.  Pertinent Medical History:   Past Medical History:  Diagnosis Date   Anemia of chronic disease    Ankle fracture 02/15/2016   Cancer (HCC)    Prostate   Chronic constipation    Chronic kidney disease    stage III   Diabetes mellitus without complication (HCC)    type II    Diabetic peripheral neuropathy (HCC)    Failure to  thrive (0-17)    Fracture of left lower leg    Gout    Hyperlipidemia    Hypertension    Morbid obesity (HCC)    Unstable gait    Significant Hospital Events: Including procedures, antibiotic start and stop dates in addition to other pertinent events   2/5 - Presented to [facility] ED with poor responsiveness, aphasia, R-sided deficits. CT Head with L MCA territory infarct. CTA Head/Neck with occlusion of L MCA M2 segment and severe near-occlusive stenosis of L ICA. Taken to [facility] for MT, stent and angioplasty ([name]). PCCM consulted for post-procedure management. 2/6 transfer out of ICU 2/15 support aspiration event early a.m. resulting in respiratory distress, transferred back to ICU and emergently intubated with development of severe shock requiring epi, levo, vaso. Transfused PRBC x1 for possible GI bleed  Interim History / Subjective:  Remains on epi 2, levophed 16, vasopressin 0.04. Improved Coox with no evidence of cardiogenic shock with inotropic support Last night had runs of VT likely due to vasopressor  Objective:  Blood pressure (!) 140/44, pulse (!) 59, temperature (!) 97.1 F (36.2 C), temperature source Axillary, resp. rate (!) 22, height 5\' 10"  (1.778 m), weight 134.7 kg, SpO2 99%.    Vent Mode: PRVC FiO2 (%):  [50 %-100 %] 50 % Set Rate:  [18 bmp-22 bmp] 22 bmp Vt Set:  [580 mL] 580 mL PEEP:  [10 cmH20] 10 cmH20 Plateau Pressure:  [15 cmH20-17 cmH20] 15 cmH20   Intake/Output Summary (Last 24 hours) at 06/16/2023 0756 Last data filed at 06/16/2023 0700 Gross per 24 hour  Intake 7785.2 ml  Output 625  ml  Net 7160.2 ml   Filed Weights   06/14/23 0414 06/15/23 0500 06/16/23 0500  Weight: 122.5 kg 119.7 kg 134.7 kg   Physical Exam: General: Critically and chronically ill-appearing, no acute distress HENT: Impact, AT, ETT in place Eyes: EOMI, no scleral icterus Respiratory: Diminished  to auscultation bilaterally.  No crackles, wheezing or rales Cardiovascular:  Bradycardia, RR, -M/R/G, no JVD GI: BS+, soft, nontender Extremities:-Edema,-tenderness Neuro: Sedated, pinpoint pupils, CNII-XII grossly intact GU: Foley in place  K 5.2 WBC 44.5 Hg 11>9.3  Resolved Hospital Problem List:    Assessment & Plan:  Acute Hypoxic Respiratory Failure  -Secondary to aspiration event  P: Full vent support LTVV, 4-8cc/kg IBW with goal Pplat<30 and DP<15 Head of bed elevated 30 degrees. Plateau pressures less than 30 cm H20.  Follow intermittent chest x-ray and ABG.   Hold SAT/SBT as tolerated due to critical illness Ensure adequate pulmonary hygiene  VAP bundle in place  PAD protocol: Propofol and fentanyl Broadened to renally dosed Vanc and Cefepime 2/15 Follow BAL and blood cultures  Coffee-ground emesis with concern for acute GI bleed -Patient vomited coffee-ground emesis with copious secretions suctioned during intubation -Hemoglobin currently stable at 11.2 platelets drastically increased from 187 2/11-3 17 2/15 indicating likely hand" P: Hold aspirin and Plavix. Hold Plavix additional day unless Stroke differs Holding GI consult. No further evidence of bleed Place OG to suction Twice daily PPI Type and screen  Maintain IV access Pressors as below   Septic shock secondary to aspiration pneumonia + cardiogenic shock -Likely multifactorial including potential sepsis in the setting of aspiration event and poor hypovolemic due to GI bleed P: Maintain Epi. Wean levophed and vasopressin for SBP >90 or MAP >65 LR 150 cc/hr. Ends mid-day Stress dose steroids Antibiotics as above Follow cultures Trend co-ox Obtain CT CAP  Left MCA M2 occlusion and left ICA near occlusion now s/p thrombectomy with stent angioplasty -Likely secondary to large vessel disease versus cardiomyopathy with low EF P: Primary management per stroke Management per neurology  Maintain neuro protective measures Nutrition and bowel regiment  Seizure precautions  AEDs  per neurology  Aspirations precautions  Holding aspirin and Plavix. [id number] 73. Hold Plavix additional day unless Stroke differs  HFrEF -Echo 06/05/2023 with a EF 30% with global hypokinesis, normal RV function Hypertension HLD P: Continuous telemetry  Hold ASA and Plavix given concern for GI bleed  Strict intake and output  Daily weight to assess volume status Daily assessment for need to diurese Closely monitor renal function and electrolytes  Vent support as above Ensure hemodynamic control   Type 2 diabetes with hyperglycemia  P: Insulin gtt in setting of critical illness. Transition to subQ when clinically improved CBG goal 140-180 CBG check q4   Acute kidney injury superimposed on CKD stage IIIb -Creatinine drastically increased to 3.11 GFR 17 a.m. 2/15 Hyperkalemia  P: Lokelma 10 mg BID x 48 hours Follow renal function  Monitor urine output Trend Bmet Avoid nephrotoxins Ensure adequate renal perfusion  IV hydration  Best Practice (right click and "Reselect all SmartList Selections" daily)   Diet/type: NPO DVT prophylaxis SCD Pressure ulcer(s): N/A GI prophylaxis: PPI Lines: N/A Foley:  Yes, and it is still needed Code Status:  full code Last date of multidisciplinary goals of care discussion: Continue to update patient and family daily   Critical care time: N/A    The patient is critically ill with multiple organ systems failure and requires high complexity decision making for assessment  and support, frequent evaluation and titration of therapies, application of advanced monitoring technologies and extensive interpretation of multiple databases.  Independent Critical Care Time: 60 Minutes.   Mechele Collin, M.D. [facility] Pulmonary/Critical Care Medicine 06/16/2023 7:56 AM   Please see Amion for pager number to reach on-call Pulmonary and Critical Care Team.

## 2023-06-16 NOTE — Progress Notes (Addendum)
 STROKE TEAM PROGRESS NOTE   SUBJECTIVE (INTERVAL HISTORY) Patient remains hemodynamically unstable on 3 pressors.  He continues to have coffee-ground output from his OG tube and has received 1 unit of packed red blood cells.  His creatinine has worsened to 4.61.  Discussion was had with patient's family regarding prognosis and possible care needs in the future, palliative care consulted to help with goals of care discussion. OBJECTIVE Temp:  [97.1 F (36.2 C)-100 F (37.8 C)] 97.4 F (36.3 C) (02/16 1122) Pulse Rate:  [53-100] 59 (02/16 1122) Cardiac Rhythm: Sinus bradycardia (02/16 0800) Resp:  [17-23] 22 (02/16 1122) BP: (65-163)/(20-102) 118/49 (02/16 1122) SpO2:  [96 %-100 %] 100 % (02/16 1122) Arterial Line BP: (84-269)/(41-132) 106/89 (02/16 1122) FiO2 (%):  [50 %-80 %] 50 % (02/16 0748) Weight:  [134.7 kg] 134.7 kg (02/16 0500)  Recent Labs  Lab 06/16/23 0342 06/16/23 0547 06/16/23 0757 06/16/23 0959 06/16/23 1150  GLUCAP 160* 164* 166* 149* 143*   Recent Labs  Lab 06/10/23 0613 06/11/23 0544 06/15/23 0625 06/15/23 1151 06/15/23 2304 06/16/23 0414  NA 147* 146* 143 141 143 141  K 4.3 3.8 5.1 5.0 5.3* 5.3*  CL 110 109 105  --  107 107  CO2 27 25 22   --  21* 20*  GLUCOSE 128* 128* 351*  --  169* 180*  BUN 43* 45* 64*  --  69* 68*  CREATININE 1.78* 1.81* 3.41*  --  4.35* 4.61*  CALCIUM 9.5 9.5 9.4  --  8.4* 8.4*  MG  --   --   --   --  2.0 2.1   Recent Labs  Lab 06/16/23 0414  AST 44*  ALT 30  ALKPHOS 45  BILITOT 1.1  PROT 5.5*  ALBUMIN 2.5*    Recent Labs  Lab 06/11/23 0544 06/15/23 0625 06/15/23 1101 06/15/23 1151 06/15/23 1521 06/15/23 1800 06/16/23 0414  WBC 8.1 10.1 26.5*  --   --  44.5* 41.4*  HGB 10.0* 11.2* 9.1* 11.2* 8.4* 9.3* 8.8*  HCT 31.6* 34.4* 29.7* 33.0* 27.1* 30.2* 28.3*  MCV 96.6 96.9 102.4*  --   --  101.3* 100.0  PLT 187 317 233  --   --  270 219   No results for input(s): "CKTOTAL", "CKMB", "CKMBINDEX", "TROPONINI" in the  last 168 hours. No results for input(s): "LABPROT", "INR" in the last 72 hours.  No results for input(s): "COLORURINE", "LABSPEC", "PHURINE", "GLUCOSEU", "HGBUR", "BILIRUBINUR", "KETONESUR", "PROTEINUR", "UROBILINOGEN", "NITRITE", "LEUKOCYTESUR" in the last 72 hours.  Invalid input(s): "APPERANCEUR"      Component Value Date/Time   CHOL 183 06/06/2023 0557   TRIG 97 06/16/2023 0414   HDL 60 06/06/2023 0557   CHOLHDL 3.1 06/06/2023 0557   VLDL 16 06/06/2023 0557   LDLCALC 107 (H) 06/06/2023 0557   LDLCALC 223 (H) 05/31/2021 0000   Lab Results  Component Value Date   HGBA1C 6.1 (H) 06/05/2023      Component Value Date/Time   LABOPIA NONE DETECTED 06/05/2023 1653   COCAINSCRNUR NONE DETECTED 06/05/2023 1653   LABBENZ NONE DETECTED 06/05/2023 1653   AMPHETMU NONE DETECTED 06/05/2023 1653   THCU NONE DETECTED 06/05/2023 1653   LABBARB NONE DETECTED 06/05/2023 1653    No results for input(s): "ETH" in the last 168 hours.   I have personally reviewed the radiological images below and agree with the radiology interpretations.  ECHOCARDIOGRAM LIMITED Result Date: 06/15/2023    ECHOCARDIOGRAM LIMITED REPORT   Patient Name:   Nathan Proctor. Date  of Exam: 06/15/2023 Medical Rec #:  [id number]              Height:       70.0 in Accession #:    [id number]             Weight:       263.9 lb Date of Birth:  05/28/1940              BSA:          2.348 m Patient Age:    83 years               BP:           121/53 mmHg Patient Gender: M                      HR:           90 bpm. Exam Location:  Inpatient Procedure: Limited Echo, Color Doppler and Intracardiac Opacification Agent            (Both Spectral and Color Flow Doppler were utilized during            procedure). Indications:    Stroke i63.9, Shock  History:        Patient has prior history of Echocardiogram examinations, most                 recent 06/05/2023. CHF; Risk Factors:Hypertension and                 Dyslipidemia.  Sonographer:     Irving Burton Senior RDCS Referring Phys: [phone] CHI JANE ELLISON  Sonographer Comments: Technically difficult due to body habitus, scanned supine on artificial repirator IMPRESSIONS  1. Very difficult to determine LVEF to limited visualization and frequent ectopy. . Left ventricular ejection fraction, by estimation, is 40 to 45%. The left ventricle has mildly decreased function. Left ventricular endocardial border not optimally defined to evaluate regional wall motion.  2. Right ventricular systolic function was not well visualized. The right ventricular size is not well visualized.  3. The inferior vena cava is normal in size with <50% respiratory variability, suggesting right atrial pressure of 8 mmHg.  4. Limited echo FINDINGS  Left Ventricle: Very difficult to determine LVEF to limited visualization and frequent ectopy. Left ventricular ejection fraction, by estimation, is 40 to 45%. The left ventricle has mildly decreased function. Left ventricular endocardial border not optimally defined to evaluate regional wall motion. Definity contrast agent was given IV to delineate the left ventricular endocardial borders. Right Ventricle: The right ventricular size is not well visualized. Right vetricular wall thickness was not well visualized. Right ventricular systolic function was not well visualized. Venous: The inferior vena cava is normal in size with less than 50% respiratory variability, suggesting right atrial pressure of 8 mmHg. Additional Comments: Color Doppler performed.  Dina Rich MD Electronically signed by Dina Rich MD Signature Date/Time: 06/15/2023/4:31:38 PM    Final    DG Abd 1 View Result Date: 06/15/2023 CLINICAL DATA:  Orogastric tube placement. EXAM: ABDOMEN - 1 VIEW COMPARISON:  Radiograph 06/08/2023 FINDINGS: Tip of the enteric tube below the diaphragm in the stomach, the side port is just beyond the gastroesophageal junction. There is gaseous gastric distension. Dilated bowel in the  right abdomen up 4.7 cm, potentially small bowel, but incompletely included in the field of view. IMPRESSION: 1. Tip of the enteric tube below the diaphragm in the stomach, side-port just beyond the gastroesophageal  junction. 2. Gaseous gastric distension. Additional dilated bowel in the right abdomen likely small bowel, although incompletely included in the field of view. Electronically Signed   By: Narda Rutherford M.D.   On: 06/15/2023 15:06   DG CHEST PORT 1 VIEW Result Date: 06/15/2023 CLINICAL DATA:  10031 Cough 10031 EXAM: PORTABLE CHEST 1 VIEW COMPARISON:  June 08, 2023 FINDINGS: The cardiomediastinal silhouette is unchanged in contour.Unchanged elevation of the RIGHT hemidiaphragm. No pleural effusion. No pneumothorax. Bibasilar platelike opacities. Atherosclerotic calcifications. Similar background of interstitial prominence. IMPRESSION: Bibasilar platelike opacities, favored to reflect atelectasis. Electronically Signed   By: Meda Klinefelter M.D.   On: 06/15/2023 12:07   DG CHEST PORT 1 VIEW Result Date: 06/15/2023 CLINICAL DATA:  Respiratory failure, intubation and central line placement. EXAM: PORTABLE CHEST 1 VIEW COMPARISON:  Film earlier today at 0734 hours FINDINGS: Interval intubation with the endotracheal tube tip approximately 3.5 cm above the carina. Left jugular central line placement with the line tip in the upper SVC. No pneumothorax. Volume loss of the right lung remains with elevation of the right hemidiaphragm. Improved expansion of the left lung. Potential underlying mild pulmonary interstitial edema. No significant pleural effusions. IMPRESSION: 1. Interval intubation and left jugular central line placement. No pneumothorax. 2. Improved expansion of the left lung. Persistent volume loss of the right lung with elevation of the right hemidiaphragm. Potential underlying mild pulmonary interstitial edema. Electronically Signed   By: Irish Lack M.D.   On: 06/15/2023 10:42    DG Swallowing Func-Speech Pathology Result Date: 06/12/2023 Table formatting from the original result was not included. Modified Barium Swallow Study Patient Details Name: Nathan Proctor. MRN: [id number] Date of Birth: [birth year] Today's Date: 06/12/2023 HPI/PMH: HPI: Pt is a 83 y.o. male who was admitted as a code stroke on 02/05 due to acute onset of right-sided weakness, right facial droop and aphasia. MRI from 02/06 displayed an evolving acute left MCA infarct, most pronounced at the left insula and left frontal operculum. Pt has intentionally lost 300 pounds in the recent past, with pre-loss weight at ~600Ib. PMHx includes hypertension, diabetes, CKD, prior CVA. Clinical Impression: Clinical Impression: Pt presents with a mild-moderate oropharyngeal dysphagia (DIGEST Score: 1) primarily characterized by silent penetration of thin liquid at level of VFs, flash penetration of nectar-thick above VFs, and diffuse pharyngeal residue. Pt's dysphagia severity level dropped from DIGEST score of 3 to 1, compared to previous MBS eval on 02/07.     Pharyngeal residue was present throughout all consistencies. Pt's posture for swallowing was consistently neutral. Pt follows commands for compensatory strategies more readily now with max multimodal cueing. Effective compensatory strategies include multiple swallows, throat clearing, and a straw when used with a verbally cued slow rate. Multiple consecutive sips of thin-liquid presented the most risk for aspiration due to silent penetration of Vfs. Unlike previous MBS study, pt tolerated solid consistency, displaying prolonged mastication but majority clearance of bolus after 2-3 swallows.     Recommendations include upgrading pt's diet to dysphagia 2 (finely chopped) and thin liquids to optimize safety and efficiency for swallowing. SLP f/u to monitor tolerance of thin-liquids with compensatory strategies is highly recommended. Pt needs to remain upright for 30  minutes after PO intake due to remaining pharyngeal residue to optimize airway safety. Factors that may increase risk of adverse event in presence of aspiration [name] & Clearance [name] 2021): Factors that may increase risk of adverse event in presence of aspiration [name] & Clearance [name] 2021): Dependence for feeding  and/or oral hygiene Recommendations/Plan: Swallowing Evaluation Recommendations Swallowing Evaluation Recommendations Recommendations: PO diet PO Diet Recommendation: Dysphagia 2 (Finely chopped); Thin liquids (Level 0) Liquid Administration via: Straw Medication Administration: Crushed with puree Supervision: Full supervision/cueing for swallowing strategies; Full assist for feeding Swallowing strategies  : Slow rate; Small bites/sips; Multiple dry swallows after each bite/sip; Clear throat intermittently Postural changes: Position pt fully upright for meals; Stay upright 30-60 min after meals Oral care recommendations: Oral care BID (2x/day); Staff/trained caregiver to provide oral care Treatment Plan Treatment Plan Follow-up recommendations: Follow physicians's recommendations for discharge plan and follow up therapies Functional status assessment: Patient has had a recent decline in their functional status and demonstrates the ability to make significant improvements in function in a reasonable and predictable amount of time. Recommendations Recommendations for follow up therapy are one component of a multi-disciplinary discharge planning process, led by the attending physician.  Recommendations may be updated based on patient status, additional functional criteria and insurance authorization. Assessment: Orofacial Exam: Orofacial Exam Oral Cavity: Oral Hygiene: WFL Oral Cavity - Dentition: Adequate natural dentition Orofacial Anatomy: WFL Anatomy: Anatomy: WFL Boluses Administered: Boluses Administered Boluses Administered: Thin liquids (Level 0); Mildly thick liquids (Level 2, nectar thick); Moderately thick  liquids (Level 3, honey thick); Puree; Solid  Oral Impairment Domain: Oral Impairment Domain Lip Closure: Interlabial escape, no progression to anterior lip Tongue control during bolus hold: Cohesive bolus between tongue to palatal seal Bolus preparation/mastication: Slow prolonged chewing/mashing with complete recollection Bolus transport/lingual motion: Brisk tongue motion Oral residue: Residue collection on oral structures Location of oral residue : Tongue Initiation of pharyngeal swallow : Pyriform sinuses  Pharyngeal Impairment Domain: Pharyngeal Impairment Domain Soft palate elevation: No bolus between soft palate (SP)/pharyngeal wall (PW) Laryngeal elevation: Partial superior movement of thyroid cartilage/partial approximation of arytenoids to epiglottic petiole Anterior hyoid excursion: Complete anterior movement Epiglottic movement: Complete inversion Laryngeal vestibule closure: Incomplete, narrow column air/contrast in laryngeal vestibule Pharyngeal stripping wave : Present - complete Pharyngeal contraction (A/P view only): N/A Pharyngoesophageal segment opening: Complete distension and complete duration, no obstruction of flow Tongue base retraction: Trace column of contrast or air between tongue base and PPW Pharyngeal residue: Collection of residue within or on pharyngeal structures Location of pharyngeal residue: Diffuse (>3 areas)  Esophageal Impairment Domain: Esophageal Impairment Domain Esophageal clearance upright position: -- (Not tested) Pill: No data recorded Penetration/Aspiration Scale Score: Penetration/Aspiration Scale Score 1.  Material does not enter airway: Solid; Puree; Moderately thick liquids (Level 3, honey thick) 2.  Material enters airway, remains ABOVE vocal cords then ejected out: Mildly thick liquids (Level 2, nectar thick) 5.  Material enters airway, CONTACTS cords and not ejected out: Thin liquids (Level 0) Compensatory Strategies: Compensatory Strategies Compensatory  strategies: Yes Straw: Effective Effective Straw: Mildly thick liquid (Level 2, nectar thick) (Effective with slow rate applied.) Multiple swallows: Effective Effective Multiple Swallows: Thin liquid (Level 0); Mildly thick liquid (Level 2, nectar thick); Moderately thick liquid (Level 3, honey thick); Puree; Solid   General Information: Caregiver present: No  Diet Prior to this Study: Dysphagia 1 (pureed); Moderately thick liquids (Level 3, honey thick)   Temperature : Normal   Respiratory Status: WFL   Supplemental O2: None (Room air)   History of Recent Intubation: Yes  Behavior/Cognition: Alert; Cooperative; Pleasant mood; Requires cueing Self-Feeding Abilities: Needs assist with self-feeding Baseline vocal quality/speech: Normal Volitional Cough: Unable to elicit Volitional Swallow: Able to elicit Exam Limitations: No limitations Goal Planning: Prognosis for improved oropharyngeal function: Good  Barriers to Reach Goals: Language deficits No data recorded No data recorded No data recorded Pain: Pain Assessment Pain Assessment: No/denies pain Pain Score: 0 End of Session: Start Time:SLP Start Time (ACUTE ONLY): 1310 Stop Time: SLP Stop Time (ACUTE ONLY): 1335 Time Calculation:SLP Time Calculation (min) (ACUTE ONLY): 25 min Charges: SLP Evaluations $ SLP Speech Visit: 1 Visit SLP Evaluations $MBS Swallow: 1 Procedure $Swallowing Treatment: 1 Procedure SLP visit diagnosis: SLP Visit Diagnosis: Dysphagia, oropharyngeal phase (R13.12) Past Medical History: Past Medical History: Diagnosis Date  Anemia of chronic disease   Ankle fracture 02/15/2016  Cancer (HCC)   Prostate  Chronic constipation   Chronic kidney disease   stage III  Diabetes mellitus without complication (HCC)   type II   Diabetic peripheral neuropathy (HCC)   Failure to thrive (0-17)   Fracture of left lower leg   Gout   Hyperlipidemia   Hypertension   Morbid obesity (HCC)   Unstable gait  Past Surgical History: Past Surgical History: Procedure  Laterality Date  IR CT HEAD LTD  06/05/2023  IR INTRAVSC STENT CERV CAROTID W/O EMB-PROT MOD SED INC ANGIO  06/05/2023  IR PERCUTANEOUS ART THROMBECTOMY/INFUSION INTRACRANIAL INC DIAG ANGIO  06/05/2023  IR US GUIDE VASC ACCESS RIGHT  06/05/2023  ORIF ANKLE FRACTURE Left 02/29/2016  Procedure: OPEN REDUCTION INTERNAL FIXATION (ORIF) ANKLE FRACTURE;  Surgeon: Yolonda Kida, MD;  Location: [facility];  Service: Orthopedics;  Laterality: Left;  PROSTATE SURGERY    RADIOLOGY WITH ANESTHESIA N/A 06/05/2023  Procedure: IR WITH ANESTHESIA;  Surgeon: Radiologist, Medication, MD;  Location: [facility];  Service: Radiology;  Laterality: N/A; Claudine Mouton 06/12/2023, 3:16 PM  DG Abd 1 View Result Date: 06/09/2023 CLINICAL DATA:  [id number]. Encounter for feeding tube placement. EXAM: ABDOMEN - 1 VIEW COMPARISON:  Abdomen film 06/05/2023. FINDINGS: Dobbhoff feeding tube is well placed with the radiopaque tip in the distal stomach. The visualized bowel pattern is nonobstructive. There is barium newly seen in the flexures and transverse colon. There is no supine evidence of free air. There is atelectasis in the lung bases. Cardiomegaly. IMPRESSION: 1. Dobbhoff feeding tube well placed with the radiopaque tip in the distal stomach. 2. Nonobstructive bowel gas pattern. 3. Cardiomegaly. Electronically Signed   By: Almira Bar M.D.   On: 06/09/2023 03:41   DG CHEST PORT 1 VIEW Result Date: 06/08/2023 CLINICAL DATA:  CHF EXAM: PORTABLE CHEST 1 VIEW COMPARISON:  01/06/2023 FINDINGS: Feeding tube in place. There is cardiomegaly with vascular congestion. Chronic elevation of the right hemidiaphragm with right base atelectasis. Interstitial prominence throughout the lungs, right greater than left could reflect interstitial edema. Possible small effusions. No acute bony abnormality. IMPRESSION: Cardiomegaly with vascular congestion and probable mild interstitial edema. Question small bilateral effusions. Chronic elevation of the right  hemidiaphragm with right base atelectasis. Electronically Signed   By: Charlett Nose M.D.   On: 06/08/2023 18:20   DG Swallowing Func-Speech Pathology Result Date: 06/07/2023 Table formatting from the original result was not included. Modified Barium Swallow Study Study completed and documented by [name], SLP Student Supervised and reviewed by Harlon Ditty, MA CCC-SLP [facility] Secure Chat Preferred Office [phone] Patient Details Name: Nathan Proctor. MRN: [id number] Date of Birth: 1940-09-30 Today's Date: 06/07/2023 HPI/PMH: HPI: Pt is a 83 y.o. male who was admitted as a code stroke on 02/05 due to acute onset of right-sided weakness, right facial droop and aphasia. MRI from 02/06 displayed an evolving acute left MCA infarct, most  pronounced at the left insula and left frontal operculum. Pt has intentionally lost 300 pounds in the recent past, with pre-loss weight at ~600Ib. PMHx includes hypertension, diabetes, CKD, prior CVA. Clinical Impression: Clinical Impression: Pt presented with a moderate-severe oropharyngeal dysphagia (DIGEST Score: 3) primarily characterized by silent aspiration of thin liquid, silent penetration of nectar-thick liquid, diffuse pharyngeal residue collection during thin/nectar-thick consistencies, and posterior-escape of bolus across multiple consistencies. Pt positioning was a potential limitation for examination due to consistent posterior head position. More anterior head positioning during nectar-thick liquid displayed less pharyngeal residue, increased hyo-laryngeal excursion, and overall better airway safety. This positioning was not present throughout due to pt's receptive language deficits to understand cues. Alternating between honey-thick and puree consistencies provided more oropharyngeal bolus clearance and no signs of aspiration. Recommend a dysphagia 1 (puree) and honey-thick liquid diet to optimize pt's airway safety and potential for  meeting nutritional needs. Slow rate, small bites/sips, natural head posturing, and multiple swallows are compensatory strategies that displayed best efficiency with pt. F/u with SLP for family education, compensatory strategy training, and upgraded diet trials is recommended. Factors that may increase risk of adverse event in presence of aspiration [name] & Clearance [name] 2021): Factors that may increase risk of adverse event in presence of aspiration [name] & Clearance [name] 2021): Weak cough Recommendations/Plan: Swallowing Evaluation Recommendations Swallowing Evaluation Recommendations Recommendations: PO diet PO Diet Recommendation: Dysphagia 1 (Pureed); Moderately thick liquids (Level 3, honey thick) Liquid Administration via: Spoon Medication Administration: Crushed with puree Supervision: Full supervision/cueing for swallowing strategies; Full assist for feeding Swallowing strategies  : Slow rate; Small bites/sips; Multiple dry swallows after each bite/sip (Pt needs to be in a neutral head position (no posterior head tilt).) Postural changes: Position pt fully upright for meals; Stay upright 30-60 min after meals Oral care recommendations: Oral care BID (2x/day); Staff/trained caregiver to provide oral care Caregiver Recommendations: Have oral suction available Treatment Plan Treatment Plan Treatment recommendations: Therapy as outlined in treatment plan below Follow-up recommendations: Follow physicians's recommendations for discharge plan and follow up therapies Functional status assessment: Patient has had a recent decline in their functional status and demonstrates the ability to make significant improvements in function in a reasonable and predictable amount of time. Treatment frequency: Min 2x/week Treatment duration: 2 weeks Interventions: Aspiration precaution training; Compensatory techniques; Patient/family education; Diet toleration management by SLP; Trials of upgraded texture/liquids Recommendations  Recommendations for follow up therapy are one component of a multi-disciplinary discharge planning process, led by the attending physician.  Recommendations may be updated based on patient status, additional functional criteria and insurance authorization. Assessment: Orofacial Exam: Orofacial Exam Oral Cavity - Dentition: Adequate natural dentition Orofacial Anatomy: WFL Anatomy: Anatomy: WFL Boluses Administered: Boluses Administered Boluses Administered: Thin liquids (Level 0); Mildly thick liquids (Level 2, nectar thick); Moderately thick liquids (Level 3, honey thick); Puree; Solid  Oral Impairment Domain: Oral Impairment Domain Lip Closure: Escape progressing to mid-chin Tongue control during bolus hold: Posterior escape of greater than half of bolus Bolus preparation/mastication: -- (Pt did not chew solid and SLP had to remove it from mouth.) Bolus transport/lingual motion: Repetitive/disorganized tongue motion Oral residue: Residue collection on oral structures Location of oral residue : Tongue; Palate Initiation of pharyngeal swallow : Pyriform sinuses  Pharyngeal Impairment Domain: Pharyngeal Impairment Domain Soft palate elevation: No bolus between soft palate (SP)/pharyngeal wall (PW) Laryngeal elevation: Partial superior movement of thyroid cartilage/partial approximation of arytenoids to epiglottic petiole Anterior hyoid excursion: Partial anterior movement Epiglottic movement: Partial inversion Laryngeal  vestibule closure: Incomplete, narrow column air/contrast in laryngeal vestibule Pharyngeal stripping wave : Present - complete Pharyngeal contraction (A/P view only): N/A Pharyngoesophageal segment opening: Complete distension and complete duration, no obstruction of flow Tongue base retraction: Narrow column of contrast or air between tongue base and PPW Pharyngeal residue: Collection of residue within or on pharyngeal structures Location of pharyngeal residue: Diffuse (>3 areas)  Esophageal  Impairment Domain: Esophageal Impairment Domain Esophageal clearance upright position: -- (Not tested.) Pill: No data recorded Penetration/Aspiration Scale Score: Penetration/Aspiration Scale Score 1.  Material does not enter airway: Puree; Moderately thick liquids (Level 3, honey thick) 3.  Material enters airway, remains ABOVE vocal cords and not ejected out: Mildly thick liquids (Level 2, nectar thick) 8.  Material enters airway, passes BELOW cords without attempt by patient to eject out (silent aspiration) : Thin liquids (Level 0) Compensatory Strategies: Compensatory Strategies Compensatory strategies: Yes Straw: Ineffective Multiple swallows: Effective (Required max cueing due to pt's language.) Effective Multiple Swallows: Puree; Moderately thick liquid (Level 3, honey thick); Mildly thick liquid (Level 2, nectar thick)   General Information: Caregiver present: No  Diet Prior to this Study: NPO   Temperature : Normal   Respiratory Status: WFL   Supplemental O2: None (Room air)   No data recorded Behavior/Cognition: Alert; Cooperative; Pleasant mood; Requires cueing Self-Feeding Abilities: Needs assist with self-feeding Baseline vocal quality/speech: Dysphonic Volitional Cough: Unable to elicit Volitional Swallow: Able to elicit Exam Limitations: Poor positioning Goal Planning: Prognosis for improved oropharyngeal function: Good Barriers to Reach Goals: Language deficits No data recorded No data recorded Consulted and agree with results and recommendations: Pt unable/family or caregiver not available Pain: Pain Assessment Pain Assessment: No/denies pain Faces Pain Scale: 0 Facial Expression: 0 Body Movements: 0 Muscle Tension: 0 Compliance with ventilator (intubated pts.): N/A Vocalization (extubated pts.): 0 CPOT Total: 0 End of Session: Start Time:SLP Start Time (ACUTE ONLY): 0903 Stop Time: SLP Stop Time (ACUTE ONLY): 0930 Time Calculation:SLP Time Calculation (min) (ACUTE ONLY): 27 min Charges: SLP  Evaluations $ SLP Speech Visit: 1 Visit SLP Evaluations $BSS Swallow: 1 Procedure $MBS Swallow: 1 Procedure $ SLP EVAL LANGUAGE/SOUND PRODUCTION: 1 Procedure $Swallowing Treatment: 1 Procedure SLP visit diagnosis: SLP Visit Diagnosis: Dysphagia, oropharyngeal phase (R13.12) Past Medical History: Past Medical History: Diagnosis Date  Anemia of chronic disease   Ankle fracture 02/15/2016  Cancer (HCC)   Prostate  Chronic constipation   Chronic kidney disease   stage III  Diabetes mellitus without complication (HCC)   type II   Diabetic peripheral neuropathy (HCC)   Failure to thrive (0-17)   Fracture of left lower leg   Gout   Hyperlipidemia   Hypertension   Morbid obesity (HCC)   Unstable gait  Past Surgical History: Past Surgical History: Procedure Laterality Date  IR CT HEAD LTD  06/05/2023  IR INTRAVSC STENT CERV CAROTID W/O EMB-PROT MOD SED INC ANGIO  06/05/2023  IR PERCUTANEOUS ART THROMBECTOMY/INFUSION INTRACRANIAL INC DIAG ANGIO  06/05/2023  IR US GUIDE VASC ACCESS RIGHT  06/05/2023  ORIF ANKLE FRACTURE Left 02/29/2016  Procedure: OPEN REDUCTION INTERNAL FIXATION (ORIF) ANKLE FRACTURE;  Surgeon: Yolonda Kida, MD;  Location: [facility];  Service: Orthopedics;  Laterality: Left;  PROSTATE SURGERY    RADIOLOGY WITH ANESTHESIA N/A 06/05/2023  Procedure: IR WITH ANESTHESIA;  Surgeon: Radiologist, Medication, MD;  Location: [facility];  Service: Radiology;  Laterality: N/A; Claudine Mouton 06/07/2023, 12:15 PM  CT HEAD WO CONTRAST ( ) Result Date: 06/07/2023 CLINICAL DATA:  83 year old  male status post code stroke presentation, left MCA M2 occlusion, with left MCA infarct with confluent petechial hemorrhage. EXAM: CT HEAD WITHOUT CONTRAST TECHNIQUE: Contiguous axial images were obtained from the base of the skull through the vertex without intravenous contrast. RADIATION DOSE REDUCTION: This exam was performed according to the departmental dose-optimization program which includes automated exposure control,  adjustment of the mA and/or kV according to patient size and/or use of iterative reconstruction technique. COMPARISON:  Brain MRI yesterday.  Head CT 06/05/2023. FINDINGS: Brain: Confluent mixed density infarct in the left MCA middle division, epicenter at the insula and operculum. Confluent petechial hemorrhage on series 3, image 16 appears stable from the MRI. Superimposed trace subarachnoid blood is difficult to exclude by CT (series 3, image 13), but was not apparent on MRI. Stable mild mass effect on the left lateral ventricle with only trace rightward midline shift (series 3, image 16). No ventriculomegaly. Elsewhere gray-white differentiation is stable from the presentation CT. Basilar cisterns remain patent. Vascular: Resolved hyperdense left MCA since presentation. Calcified atherosclerosis at the skull base. Skull: Stable, intact. Sinuses/Orbits: Visualized paranasal sinuses and mastoids are stable and well aerated. Other: Left nasoenteric tube now in place. Stable orbit and scalp soft tissues. IMPRESSION: 1. Stable by CT confluent Left MCA middle division infarct with petechial hemorrhage (Heidelberg classification 1b: HI2, confluent petechiae, no mass effect). Questionable trace superimposed SAH, but was not apparent on MRI. 2. Stable minimal intracranial mass effect. 3. No new intracranial abnormality. Electronically Signed   By: Odessa Fleming M.D.   On: 06/07/2023 05:37   MR BRAIN WO CONTRAST Result Date: 06/06/2023 CLINICAL DATA:  Follow-up examination for stroke. EXAM: MRI HEAD WITHOUT CONTRAST TECHNIQUE: Multiplanar, multiecho pulse sequences of the brain and surrounding structures were obtained without intravenous contrast. COMPARISON:  Comparison made to multiple previous exams from 06/05/2023. FINDINGS: Brain: Examination degraded by motion artifact. Cerebral volume within normal limits for age. Patchy T2/FLAIR hyperintensity involving the periventricular deep white matter both cerebral hemispheres,  consistent with chronic small vessel ischemic disease, mild in nature. Small remote infarct noted within the right cerebellum. Confluent restricted diffusion involving the left insula and left frontal operculum, consistent with evolving acute left MCA distribution infarct. Area of infarction measures up to approximately 6 cm in AP diameter. Prominent associated susceptibility artifact, consistent with petechial hemorrhage (series 7, image 61) ( Heidelberg classification 1b: HI2, confluent petechiae, no mass effect. No frank or organized hematoma evident by MRI. No significant regional mass effect. Few additional small foci of infarction noted within the left parieto-occipital region posteriorly (series 3, images 40, 29). Apparent diffusion signal along the right frontal parafalcine region felt to be consistent with artifact related to dural calcification. Note made of a few additional chronic micro hemorrhages within the left cerebellum and right thalamus. No mass lesion or midline shift. Ventricles normal size without hydrocephalus. No extra-axial fluid collection. Pituitary gland suprasellar region within normal limits. Vascular: Major intracranial vascular flow voids are maintained. Skull and upper cervical spine: Craniocervical junction within normal limits. Bone marrow signal intensity overall within normal limits. No scalp soft tissue abnormality. Sinuses/Orbits: Prior bilateral ocular lens replacement. Paranasal sinuses are clear. No mastoid effusion. Other: None. IMPRESSION: 1. Evolving acute left MCA distribution infarct, most pronounced at the left insula and left frontal operculum. Prominent associated petechial hemorrhage without frank intraparenchymal hematoma (Heidelberg classification 1b: HI2, confluent petechiae, no mass effect). 2. Few additional small foci of acute infarction within the left parieto-occipital region posteriorly. 3. Underlying mild chronic  microvascular ischemic disease with small  remote right cerebellar infarct. Electronically Signed   By: Rise Mu M.D.   On: 06/06/2023 03:12   ECHOCARDIOGRAM COMPLETE Result Date: 06/05/2023    ECHOCARDIOGRAM REPORT   Patient Name:   Nathan Proctor. Date of Exam: 06/05/2023 Medical Rec #:  [id number]              Height:       70.0 in Accession #:    [id number]             Weight:       292.3 lb Date of Birth:  [birth year]              BSA:          2.453 m Patient Age:    82 years               BP:           129/72 mmHg Patient Gender: M                      HR:           89 bpm. Exam Location:  Inpatient Procedure: 2D Echo, Color Doppler, Cardiac Doppler and Intracardiac            Opacification Agent Indications:    Stroke I63.9  History:        Patient has prior history of Echocardiogram examinations, most                 recent 10/06/2019. Risk Factors:Diabetes, Hypertension and                 Dyslipidemia.  Sonographer:    Harriette Bouillon RDCS Referring Phys: Lynnae January IMPRESSIONS  1. Left ventricular ejection fraction, by estimation, is 30%. The left ventricle has moderately decreased function. The left ventricle demonstrates global hypokinesis. There is mild left ventricular hypertrophy.  2. Right ventricular systolic function is normal. The right ventricular size is normal.  3. Trivial mitral valve regurgitation.  4. The aortic valve is tricuspid. Aortic valve regurgitation is not visualized.  5. The inferior vena cava is normal in size with greater than 50% respiratory variability, suggesting right atrial pressure of 3 mmHg. Comparison(s): The left ventricular function is worsened. FINDINGS  Left Ventricle: Left ventricular ejection fraction, by estimation, is 30%. The left ventricle has moderately decreased function. The left ventricle demonstrates global hypokinesis. Definity contrast agent was given IV to delineate the left ventricular endocardial borders. The left ventricular internal cavity size was normal in size. There is  mild left ventricular hypertrophy. Right Ventricle: The right ventricular size is normal. Right vetricular wall thickness was not assessed. Right ventricular systolic function is normal. Left Atrium: Left atrial size was normal in size. Right Atrium: Right atrial size was normal in size. Pericardium: There is no evidence of pericardial effusion. Mitral Valve: There is mild thickening of the mitral valve leaflet(s). Mild to moderate mitral annular calcification. Trivial mitral valve regurgitation. Tricuspid Valve: The tricuspid valve is normal in structure. Tricuspid valve regurgitation is trivial. Aortic Valve: The aortic valve is tricuspid. Aortic valve regurgitation is not visualized. Pulmonic Valve: The pulmonic valve was normal in structure. Pulmonic valve regurgitation is not visualized. Aorta: The aortic root and ascending aorta are structurally normal, with no evidence of dilitation. Venous: The inferior vena cava is normal in size with greater than 50% respiratory variability, suggesting right atrial pressure of 3  mmHg. IAS/Shunts: The interatrial septum was not assessed.  LEFT VENTRICLE PLAX 2D LVIDd:         5.00 cm   Diastology LVIDs:         4.40 cm   LV e' lateral: 5.33 cm/s LV PW:         1.20 cm LV IVS:        1.10 cm LVOT diam:     2.30 cm LV SV:         55 LV SV Index:   23 LVOT Area:     4.15 cm  RIGHT VENTRICLE RV S prime:     14.40 cm/s LEFT ATRIUM         Index LA diam:    4.60 cm 1.88 cm/m  AORTIC VALVE LVOT Vmax:   65.50 cm/s LVOT Vmean:  45.200 cm/s LVOT VTI:    0.133 m  AORTA Ao Root diam: 3.00 cm Ao Asc diam:  3.50 cm  SHUNTS Systemic VTI:  0.13 m Systemic Diam: 2.30 cm Dietrich Pates MD Electronically signed by Dietrich Pates MD Signature Date/Time: 06/05/2023/9:33:38 PM    Final    DG Abd Portable 1V Result Date: 06/05/2023 CLINICAL DATA:  Feeding tube placement. EXAM: PORTABLE ABDOMEN - 1 VIEW COMPARISON:  None Available. FINDINGS: Distal tip of feeding tube is seen in expected position of  distal stomach. IMPRESSION: Distal tip of feeding tube seen in expected position of distal stomach. Electronically Signed   By: Lupita Raider M.D.   On: 06/05/2023 15:49   CT HEAD WO CONTRAST Result Date: 06/05/2023 CLINICAL DATA:  Stroke, follow-up. Status post intracranial mechanical thrombectomy and stenting of the left ICA bifurcation. EXAM: CT HEAD WITHOUT CONTRAST TECHNIQUE: Contiguous axial images were obtained from the base of the skull through the vertex without intravenous contrast. RADIATION DOSE REDUCTION: This exam was performed according to the departmental dose-optimization program which includes automated exposure control, adjustment of the mA and/or kV according to patient size and/or use of iterative reconstruction technique. COMPARISON:  CT head without contrast and CT angio head and neck 06/05/2023. By plain CT 06/05/2023. FINDINGS: Brain: The study is mildly degraded by patient motion. The left insular and left opercular infarct is somewhat obscured by patient motion. The cortex is slightly hyperdense, potentially reflecting reperfusion. No hemorrhage is present. Basal ganglia are intact. Subcortical white matter hypoattenuation in the high left frontal lobe is new. Mild right-sided white matter disease is stable. Basal ganglia are intact. A remote lacunar infarct is again noted in the right cerebellum. The brainstem and cerebellum are otherwise within normal limits. Vascular: Atherosclerotic calcifications are present within the cavernous internal carotid arteries and at the normal origin of both vertebral arteries. No hyperdense vessel is present. Skull: Calvarium is intact. No focal lytic or blastic lesions are present. No significant extracranial soft tissue lesion is present. Sinuses/Orbits: The paranasal sinuses and mastoid air cells are clear. Bilateral lens replacements are noted. Globes and orbits are otherwise unremarkable. IMPRESSION: 1. The left insular and left opercular infarct  is somewhat obscured by patient motion. 2. The cortex is slightly hyperdense, potentially reflecting reperfusion. 3. No hemorrhage. 4. Subcortical white matter hypoattenuation in the high left frontal lobe is new. This may reflect ischemic changes or edema related to the reperfusion. 5. Remote lacunar infarct of the right cerebellum. 6. Stable mild right-sided white matter disease. This likely reflects the sequela of chronic microvascular ischemia. Electronically Signed   By: Marin Roberts M.D.   On:  06/05/2023 14:38   IR PERCUTANEOUS ART THROMBECTOMY/INFUSION INTRACRANIAL INC DIAG ANGIO Result Date: 06/05/2023 INDICATION: 83 year old male presenting with right-sided weakness and aphasia; NIHSS 28. His last known well was 10 p.m. on 06/04/2023. His past medical history significant for prior stroke, hypertension, diabetes and chronic kidney disease; baseline modified Rankin scale 0. Head CT showed hypodensity within the left insula, basal ganglia and left frontal operculum (ASPECTS 7). No IV thrombolytic given as patient was outside the window. CT angiogram of the head and neck showed an occlusion of a left M2/MCA anterior division branch. CT perfusion showed a 41 mL core infarct with a 45 mL ischemic penumbra. She was transferred to our service for mechanical thrombectomy. EXAM: ULTRASOUND-GUIDED VASCULAR ACCESS DIAGNOSTIC CEREBRAL ANGIOGRAM MECHANICAL THROMBECTOMY FLAT PANEL HEAD CT LEFT CAROTID STENTING AND ANGIOPLASTY WITHOUT CEREBRAL PROTECTION DEVICE COMPARISON:  CT/CT angiogram of the head and neck June 05, 2023. MEDICATIONS: No antibiotics administered. ANESTHESIA/SEDATION: The procedure was performed under general anesthesia. CONTRAST:  80 mL of Omnipaque 300 milligram/mL FLUOROSCOPY: Radiation Exposure Index (as provided by the fluoroscopic device): 1315 mGy Kerma COMPLICATIONS: None immediate. TECHNIQUE: Informed written consent was obtained from the patient's wife after a thorough discussion  of the procedural risks, benefits and alternatives. All questions were addressed. Maximal Sterile Barrier Technique was utilized including caps, mask, sterile gowns, sterile gloves, sterile drape, hand hygiene and skin antiseptic. A timeout was performed prior to the initiation of the procedure. The right groin was prepped and draped in the usual sterile fashion. Using a micropuncture kit and the modified Seldinger technique, access was gained to the right common femoral artery and an 8 French sheath was placed. Real-time ultrasound guidance was utilized for vascular access including the acquisition of a permanent ultrasound image documenting patency of the accessed vessel. Under fluoroscopy, an 8 [place] Walrus balloon guide catheter was navigated over a 6 [place] VTK catheter and a 0.035" Terumo Glidewire into the aortic arch. The catheter was placed into the left common carotid artery and then advanced into the left internal carotid artery. The diagnostic catheter was removed. Frontal and lateral angiograms of the head were obtained. FINDINGS: 1. Ultrasound showed heavily calcified right common femoral artery is maintained patency and caliber. 2. Proximal occlusion of a left M2/MCA anterior division branch. 3. Atherosclerotic changes of the intracranial left ICA with mild stenosis at the distal cavernous segment. 4. 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 PROCEDURE: Using biplane roadmap guidance, a Red 62 aspiration catheter was navigated over Colossus 35 microguidewire into the cavernous segment of the left ICA. The aspiration catheter was then advanced to the level of occlusion and connected to an aspiration pump. Continuous aspiration was performed for 2 minutes. The guide catheter was connected to a VacLok syringe and the guiding catheter balloon was inflated. The aspiration catheter was subsequently  removed under constant aspiration. The guide catheter was aspirated for debris. Left internal carotid artery angiograms with frontal and lateral views of the head showed complete recanalization of the left MCA vascular tree. The guide catheter was retracted into the neck. Frontal and lateral angiograms of the neck were obtained. Improvement of the degree of stenosis in the carotid bulb compared to prior CT angiogram. There is prominent luminal irregularity at the carotid bulb residual moderate stenosis. Increased tortuosity of the proximal/mid cervical left ICA with kinking. Left internal carotid artery angiograms with left anterior oblique views of the neck  showed evidence of filling defects at the level of the carotid bulb. Flat panel CT of the head was obtained and post processed in a separate workstation with concurrent attending physician supervision. Selected images were sent to PACS. No evidence of hemorrhagic complication. There is mild contrast staining of the left insular and frontal cortex. Repeat left internal carotid artery angiograms with frontal and lateral views of the head showed Amy seen left M3/MCA branch to the left parietal region. Left common carotid artery angiograms with frontal and lateral views of the neck showed vertebra Gretchen of stenosis at the left ICA bulb with more prominent filling defect. At this point, patient was loaded on cangrelor followed by continuous drip. Using biplane roadmap guidance, a 4-7 mm Emboshield NAV6 cerebral protection device was advanced into the cervical left ICA. However, multiple attempts to advance a cerebral protection device through the left ICA kinking proved unsuccessful. The cerebral protection device was subsequently removed. Using biplane roadmap guidance, a 10-8 x 40 mm XACT carotid stent was navigated and deployed from the distal left common carotid artery to the proximal left internal carotid artery, proximal to the vessel kinking. Suboptimal  stent expansion was noted. Then, a 6 x 30 mm Viatrac balloon was navigated into the recently deployed stent. Angioplasty was performed under fluoroscopy. Left internal carotid artery angiograms with frontal and lateral views of the neck showed adequate stent positioning and expansion with brisk anterograde flow. Left internal carotid artery angiograms with frontal and lateral views of the head showed improvement of anterograde flow in the left MCA vascular tree with brisk anterograde flow. Delayed left common carotid artery angiograms with frontal and lateral views of the neck showed no evidence of clot formation within the stent. The catheter was subsequently Riddle. Right common femoral artery angiogram was obtained in right anterior oblique view. The puncture is at the level of the common femoral artery. The artery has normal caliber, adequate for closure device. The sheath was exchanged over the wire for an 8 [place] Angio-Seal which was utilized for access closure. Immediate hemostasis was achieved. IMPRESSION: 1. Successful mechanical thrombectomy for treatment of a proximal left M2/MCA anterior division branch occlusion achieving complete recanalization (TICI 3). 2. 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 PLAN: Continue cangrelor infusion until patient is transitioned to oral dual antiplatelet therapy. Electronically Signed   By: Baldemar Lenis M.D.   On: 06/05/2023 13:16   IR US Guide Vasc Access Right Result Date: 06/05/2023 INDICATION: 83 year old male presenting with right-sided weakness and aphasia; NIHSS 28. His last known well was 10 p.m. on 06/04/2023. His past medical history significant for prior stroke, hypertension, diabetes and chronic kidney disease; baseline modified Rankin scale 0. Head CT showed hypodensity within the left insula,  basal ganglia and left frontal operculum (ASPECTS 7). No IV thrombolytic given as patient was outside the window. CT angiogram of the head and neck showed an occlusion of a left M2/MCA anterior division branch. CT perfusion showed a 41 mL core infarct with a 45 mL ischemic penumbra. She was transferred to our service for mechanical thrombectomy. EXAM: ULTRASOUND-GUIDED VASCULAR ACCESS DIAGNOSTIC CEREBRAL ANGIOGRAM MECHANICAL THROMBECTOMY FLAT PANEL HEAD CT LEFT CAROTID STENTING AND ANGIOPLASTY WITHOUT CEREBRAL PROTECTION DEVICE COMPARISON:  CT/CT angiogram of the head and neck June 05, 2023. MEDICATIONS: No antibiotics administered. ANESTHESIA/SEDATION: The procedure was performed under general anesthesia. CONTRAST:  80 mL of  Omnipaque 300 milligram/mL FLUOROSCOPY: Radiation Exposure Index (as provided by the fluoroscopic device): 1315 mGy Kerma COMPLICATIONS: None immediate. TECHNIQUE: Informed written consent was obtained from the patient's wife after a thorough discussion of the procedural risks, benefits and alternatives. All questions were addressed. Maximal Sterile Barrier Technique was utilized including caps, mask, sterile gowns, sterile gloves, sterile drape, hand hygiene and skin antiseptic. A timeout was performed prior to the initiation of the procedure. The right groin was prepped and draped in the usual sterile fashion. Using a micropuncture kit and the modified Seldinger technique, access was gained to the right common femoral artery and an 8 French sheath was placed. Real-time ultrasound guidance was utilized for vascular access including the acquisition of a permanent ultrasound image documenting patency of the accessed vessel. Under fluoroscopy, an 8 [place] Walrus balloon guide catheter was navigated over a 6 [place] VTK catheter and a 0.035" Terumo Glidewire into the aortic arch. The catheter was placed into the left common carotid artery and then advanced into the left internal carotid artery.  The diagnostic catheter was removed. Frontal and lateral angiograms of the head were obtained. FINDINGS: 1. Ultrasound showed heavily calcified right common femoral artery is maintained patency and caliber. 2. Proximal occlusion of a left M2/MCA anterior division branch. 3. Atherosclerotic changes of the intracranial left ICA with mild stenosis at the distal cavernous segment. 4. 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 PROCEDURE: Using biplane roadmap guidance, a Red 62 aspiration catheter was navigated over Colossus 35 microguidewire into the cavernous segment of the left ICA. The aspiration catheter was then advanced to the level of occlusion and connected to an aspiration pump. Continuous aspiration was performed for 2 minutes. The guide catheter was connected to a VacLok syringe and the guiding catheter balloon was inflated. The aspiration catheter was subsequently removed under constant aspiration. The guide catheter was aspirated for debris. Left internal carotid artery angiograms with frontal and lateral views of the head showed complete recanalization of the left MCA vascular tree. The guide catheter was retracted into the neck. Frontal and lateral angiograms of the neck were obtained. Improvement of the degree of stenosis in the carotid bulb compared to prior CT angiogram. There is prominent luminal irregularity at the carotid bulb residual moderate stenosis. Increased tortuosity of the proximal/mid cervical left ICA with kinking. Left internal carotid artery angiograms with left anterior oblique views of the neck showed evidence of filling defects at the level of the carotid bulb. Flat panel CT of the head was obtained and post processed in a separate workstation with concurrent attending physician supervision. Selected images were sent to PACS. No evidence of hemorrhagic complication. There is mild  contrast staining of the left insular and frontal cortex. Repeat left internal carotid artery angiograms with frontal and lateral views of the head showed Amy seen left M3/MCA branch to the left parietal region. Left common carotid artery angiograms with frontal and lateral views of the neck showed vertebra Gretchen of stenosis at the left ICA bulb with more prominent filling defect. At this point, patient was loaded on cangrelor followed by continuous drip. Using biplane roadmap guidance, a 4-7 mm Emboshield NAV6 cerebral protection device was advanced into the cervical left ICA. However, multiple attempts to advance a cerebral protection device through the left ICA kinking proved unsuccessful. The cerebral protection device was subsequently removed. Using biplane roadmap guidance, a 10-8 x 40 mm  XACT carotid stent was navigated and deployed from the distal left common carotid artery to the proximal left internal carotid artery, proximal to the vessel kinking. Suboptimal stent expansion was noted. Then, a 6 x 30 mm Viatrac balloon was navigated into the recently deployed stent. Angioplasty was performed under fluoroscopy. Left internal carotid artery angiograms with frontal and lateral views of the neck showed adequate stent positioning and expansion with brisk anterograde flow. Left internal carotid artery angiograms with frontal and lateral views of the head showed improvement of anterograde flow in the left MCA vascular tree with brisk anterograde flow. Delayed left common carotid artery angiograms with frontal and lateral views of the neck showed no evidence of clot formation within the stent. The catheter was subsequently Riddle. Right common femoral artery angiogram was obtained in right anterior oblique view. The puncture is at the level of the common femoral artery. The artery has normal caliber, adequate for closure device. The sheath was exchanged over the wire for an 8 [place] Angio-Seal which was  utilized for access closure. Immediate hemostasis was achieved. IMPRESSION: 1. Successful mechanical thrombectomy for treatment of a proximal left M2/MCA anterior division branch occlusion achieving complete recanalization (TICI 3). 2. 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 PLAN: Continue cangrelor infusion until patient is transitioned to oral dual antiplatelet therapy. Electronically Signed   By: Baldemar Lenis M.D.   On: 06/05/2023 13:16   IR INTRAVSC STENT CERV CAROTID W/O EMB-PROT MOD SED Result Date: 06/05/2023 INDICATION: 83 year old male presenting with right-sided weakness and aphasia; NIHSS 28. His last known well was 10 p.m. on 06/04/2023. His past medical history significant for prior stroke, hypertension, diabetes and chronic kidney disease; baseline modified Rankin scale 0. Head CT showed hypodensity within the left insula, basal ganglia and left frontal operculum (ASPECTS 7). No IV thrombolytic given as patient was outside the window. CT angiogram of the head and neck showed an occlusion of a left M2/MCA anterior division branch. CT perfusion showed a 41 mL core infarct with a 45 mL ischemic penumbra. She was transferred to our service for mechanical thrombectomy. EXAM: ULTRASOUND-GUIDED VASCULAR ACCESS DIAGNOSTIC CEREBRAL ANGIOGRAM MECHANICAL THROMBECTOMY FLAT PANEL HEAD CT LEFT CAROTID STENTING AND ANGIOPLASTY WITHOUT CEREBRAL PROTECTION DEVICE COMPARISON:  CT/CT angiogram of the head and neck June 05, 2023. MEDICATIONS: No antibiotics administered. ANESTHESIA/SEDATION: The procedure was performed under general anesthesia. CONTRAST:  80 mL of Omnipaque 300 milligram/mL FLUOROSCOPY: Radiation Exposure Index (as provided by the fluoroscopic device): 1315 mGy Kerma COMPLICATIONS: None immediate. TECHNIQUE: Informed written consent was  obtained from the patient's wife after a thorough discussion of the procedural risks, benefits and alternatives. All questions were addressed. Maximal Sterile Barrier Technique was utilized including caps, mask, sterile gowns, sterile gloves, sterile drape, hand hygiene and skin antiseptic. A timeout was performed prior to the initiation of the procedure. The right groin was prepped and draped in the usual sterile fashion. Using a micropuncture kit and the modified Seldinger technique, access was gained to the right common femoral artery and an 8 French sheath was placed. Real-time ultrasound guidance was utilized for vascular access including the acquisition of a permanent ultrasound image documenting patency of the accessed vessel. Under fluoroscopy, an 8 [place] Walrus balloon guide catheter was navigated over a 6 [place] VTK catheter and a 0.035" Terumo Glidewire into the aortic arch. The catheter was placed into the left common  carotid artery and then advanced into the left internal carotid artery. The diagnostic catheter was removed. Frontal and lateral angiograms of the head were obtained. FINDINGS: 1. Ultrasound showed heavily calcified right common femoral artery is maintained patency and caliber. 2. Proximal occlusion of a left M2/MCA anterior division branch. 3. Atherosclerotic changes of the intracranial left ICA with mild stenosis at the distal cavernous segment. 4. 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 PROCEDURE: Using biplane roadmap guidance, a Red 62 aspiration catheter was navigated over Colossus 35 microguidewire into the cavernous segment of the left ICA. The aspiration catheter was then advanced to the level of occlusion and connected to an aspiration pump. Continuous aspiration was performed for 2 minutes. The guide catheter was connected to a VacLok syringe and the guiding catheter  balloon was inflated. The aspiration catheter was subsequently removed under constant aspiration. The guide catheter was aspirated for debris. Left internal carotid artery angiograms with frontal and lateral views of the head showed complete recanalization of the left MCA vascular tree. The guide catheter was retracted into the neck. Frontal and lateral angiograms of the neck were obtained. Improvement of the degree of stenosis in the carotid bulb compared to prior CT angiogram. There is prominent luminal irregularity at the carotid bulb residual moderate stenosis. Increased tortuosity of the proximal/mid cervical left ICA with kinking. Left internal carotid artery angiograms with left anterior oblique views of the neck showed evidence of filling defects at the level of the carotid bulb. Flat panel CT of the head was obtained and post processed in a separate workstation with concurrent attending physician supervision. Selected images were sent to PACS. No evidence of hemorrhagic complication. There is mild contrast staining of the left insular and frontal cortex. Repeat left internal carotid artery angiograms with frontal and lateral views of the head showed Amy seen left M3/MCA branch to the left parietal region. Left common carotid artery angiograms with frontal and lateral views of the neck showed vertebra Gretchen of stenosis at the left ICA bulb with more prominent filling defect. At this point, patient was loaded on cangrelor followed by continuous drip. Using biplane roadmap guidance, a 4-7 mm Emboshield NAV6 cerebral protection device was advanced into the cervical left ICA. However, multiple attempts to advance a cerebral protection device through the left ICA kinking proved unsuccessful. The cerebral protection device was subsequently removed. Using biplane roadmap guidance, a 10-8 x 40 mm XACT carotid stent was navigated and deployed from the distal left common carotid artery to the proximal left internal  carotid artery, proximal to the vessel kinking. Suboptimal stent expansion was noted. Then, a 6 x 30 mm Viatrac balloon was navigated into the recently deployed stent. Angioplasty was performed under fluoroscopy. Left internal carotid artery angiograms with frontal and lateral views of the neck showed adequate stent positioning and expansion with brisk anterograde flow. Left internal carotid artery angiograms with frontal and lateral views of the head showed improvement of anterograde flow in the left MCA vascular tree with brisk anterograde flow. Delayed left common carotid artery angiograms with frontal and lateral views of the neck showed no evidence of clot formation within the stent. The catheter was subsequently Riddle. Right common femoral artery angiogram was obtained in right anterior oblique view. The puncture is at the level of the common femoral artery. The artery has normal caliber, adequate for closure device. The sheath was exchanged over the  wire for an 8 [place] Angio-Seal which was utilized for access closure. Immediate hemostasis was achieved. IMPRESSION: 1. Successful mechanical thrombectomy for treatment of a proximal left M2/MCA anterior division branch occlusion achieving complete recanalization (TICI 3). 2. 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 PLAN: Continue cangrelor infusion until patient is transitioned to oral dual antiplatelet therapy. Electronically Signed   By: Baldemar Lenis M.D.   On: 06/05/2023 13:16   IR CT Head Ltd Result Date: 06/05/2023 INDICATION: 83 year old male presenting with right-sided weakness and aphasia; NIHSS 28. His last known well was 10 p.m. on 06/04/2023. His past medical history significant for prior stroke, hypertension, diabetes and chronic kidney disease; baseline modified Rankin scale 0. Head CT  showed hypodensity within the left insula, basal ganglia and left frontal operculum (ASPECTS 7). No IV thrombolytic given as patient was outside the window. CT angiogram of the head and neck showed an occlusion of a left M2/MCA anterior division branch. CT perfusion showed a 41 mL core infarct with a 45 mL ischemic penumbra. She was transferred to our service for mechanical thrombectomy. EXAM: ULTRASOUND-GUIDED VASCULAR ACCESS DIAGNOSTIC CEREBRAL ANGIOGRAM MECHANICAL THROMBECTOMY FLAT PANEL HEAD CT LEFT CAROTID STENTING AND ANGIOPLASTY WITHOUT CEREBRAL PROTECTION DEVICE COMPARISON:  CT/CT angiogram of the head and neck June 05, 2023. MEDICATIONS: No antibiotics administered. ANESTHESIA/SEDATION: The procedure was performed under general anesthesia. CONTRAST:  80 mL of Omnipaque 300 milligram/mL FLUOROSCOPY: Radiation Exposure Index (as provided by the fluoroscopic device): 1315 mGy Kerma COMPLICATIONS: None immediate. TECHNIQUE: Informed written consent was obtained from the patient's wife after a thorough discussion of the procedural risks, benefits and alternatives. All questions were addressed. Maximal Sterile Barrier Technique was utilized including caps, mask, sterile gowns, sterile gloves, sterile drape, hand hygiene and skin antiseptic. A timeout was performed prior to the initiation of the procedure. The right groin was prepped and draped in the usual sterile fashion. Using a micropuncture kit and the modified Seldinger technique, access was gained to the right common femoral artery and an 8 French sheath was placed. Real-time ultrasound guidance was utilized for vascular access including the acquisition of a permanent ultrasound image documenting patency of the accessed vessel. Under fluoroscopy, an 8 [place] Walrus balloon guide catheter was navigated over a 6 [place] VTK catheter and a 0.035" Terumo Glidewire into the aortic arch. The catheter was placed into the left common carotid artery and then  advanced into the left internal carotid artery. The diagnostic catheter was removed. Frontal and lateral angiograms of the head were obtained. FINDINGS: 1. Ultrasound showed heavily calcified right common femoral artery is maintained patency and caliber. 2. Proximal occlusion of a left M2/MCA anterior division branch. 3. Atherosclerotic changes of the intracranial left ICA with mild stenosis at the distal cavernous segment. 4. 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 PROCEDURE: Using biplane roadmap guidance, a Red 62 aspiration catheter was navigated over Colossus 35 microguidewire into the cavernous segment of the left ICA. The aspiration catheter was then advanced to the level of occlusion and connected to an aspiration pump. Continuous aspiration was performed for 2 minutes. The guide catheter was connected to a VacLok syringe and the guiding catheter balloon was inflated. The aspiration catheter was subsequently removed under constant aspiration. The guide catheter was aspirated for debris. Left internal carotid  artery angiograms with frontal and lateral views of the head showed complete recanalization of the left MCA vascular tree. The guide catheter was retracted into the neck. Frontal and lateral angiograms of the neck were obtained. Improvement of the degree of stenosis in the carotid bulb compared to prior CT angiogram. There is prominent luminal irregularity at the carotid bulb residual moderate stenosis. Increased tortuosity of the proximal/mid cervical left ICA with kinking. Left internal carotid artery angiograms with left anterior oblique views of the neck showed evidence of filling defects at the level of the carotid bulb. Flat panel CT of the head was obtained and post processed in a separate workstation with concurrent attending physician supervision. Selected images were sent to PACS. No  evidence of hemorrhagic complication. There is mild contrast staining of the left insular and frontal cortex. Repeat left internal carotid artery angiograms with frontal and lateral views of the head showed Amy seen left M3/MCA branch to the left parietal region. Left common carotid artery angiograms with frontal and lateral views of the neck showed vertebra Gretchen of stenosis at the left ICA bulb with more prominent filling defect. At this point, patient was loaded on cangrelor followed by continuous drip. Using biplane roadmap guidance, a 4-7 mm Emboshield NAV6 cerebral protection device was advanced into the cervical left ICA. However, multiple attempts to advance a cerebral protection device through the left ICA kinking proved unsuccessful. The cerebral protection device was subsequently removed. Using biplane roadmap guidance, a 10-8 x 40 mm XACT carotid stent was navigated and deployed from the distal left common carotid artery to the proximal left internal carotid artery, proximal to the vessel kinking. Suboptimal stent expansion was noted. Then, a 6 x 30 mm Viatrac balloon was navigated into the recently deployed stent. Angioplasty was performed under fluoroscopy. Left internal carotid artery angiograms with frontal and lateral views of the neck showed adequate stent positioning and expansion with brisk anterograde flow. Left internal carotid artery angiograms with frontal and lateral views of the head showed improvement of anterograde flow in the left MCA vascular tree with brisk anterograde flow. Delayed left common carotid artery angiograms with frontal and lateral views of the neck showed no evidence of clot formation within the stent. The catheter was subsequently Riddle. Right common femoral artery angiogram was obtained in right anterior oblique view. The puncture is at the level of the common femoral artery. The artery has normal caliber, adequate for closure device. The sheath was exchanged over  the wire for an 8 [place] Angio-Seal which was utilized for access closure. Immediate hemostasis was achieved. IMPRESSION: 1. Successful mechanical thrombectomy for treatment of a proximal left M2/MCA anterior division branch occlusion achieving complete recanalization (TICI 3). 2. 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 PLAN: Continue cangrelor infusion until patient is transitioned to oral dual antiplatelet therapy. Electronically Signed   By: Baldemar Lenis M.D.   On: 06/05/2023 13:16   CT ANGIO HEAD NECK W WO CM W PERF (CODE STROKE) Addendum Date: 06/05/2023 ADDENDUM REPORT: 06/05/2023 12:25 ADDENDUM: Please note, there is a dictation error within CTA neck impression #1, which should read: The common carotid and internal carotid arteries are patent within the neck. Atherosclerotic plaque bilaterally. Most notably, there is progressive atherosclerotic plaque about the left carotid bifurcation and within the proximal left ICA with resultant severe near occlusive stenosis of the proximal left ICA. Also  of note, atherosclerotic plaque about the right carotid bifurcation results in a 40% stenosis at the right ICA origin. Electronically Signed   By: Jackey Loge D.O.   On: 06/05/2023 12:25   Result Date: 06/05/2023 CLINICAL DATA:  Provided history: Cerebrovascular accident, unspecified mechanism. Right-sided weakness. Right-sided facial droop. Altered mental status. EXAM: CT ANGIOGRAPHY HEAD AND NECK CT PERFUSION BRAIN TECHNIQUE: Multidetector CT imaging of the head and neck was performed using the standard protocol during bolus administration of intravenous contrast. Multiplanar CT image reconstructions and MIPs were obtained to evaluate the vascular anatomy. Carotid stenosis measurements (when applicable) are obtained utilizing NASCET criteria, using the  distal internal carotid diameter as the denominator. Multiphase CT imaging of the brain was performed following IV bolus contrast injection. Subsequent parametric perfusion maps were calculated using RAPID software. RADIATION DOSE REDUCTION: This exam was performed according to the departmental dose-optimization program which includes automated exposure control, adjustment of the mA and/or kV according to patient size and/or use of iterative reconstruction technique. CONTRAST:  OMNIPAQUE IOHEXOL 350 MG/ML SOLN COMPARISON:  Noncontrast head CT performed earlier today 06/05/2023. MRA head and MRA neck 10/04/2019. FINDINGS: CTA NECK FINDINGS Aortic arch: Common origin of the innominate and left common carotid arteries. Atherosclerotic plaque within the visualized thoracic aorta and proximal major branch vessels of the neck. Streak/beam hardening artifact arising from a dense right-sided contrast bolus partially obscures the right subclavian artery. Within this limitation, there is no appreciable hemodynamically significant innominate or proximal subclavian artery stenosis. Right carotid system: CCA and ICA patent within the neck. Atherosclerotic plaque, greatest about the carotid bifurcation. Resultant 40% stenosis at the ICA origin. Left carotid system: CCA and ICA patent within the neck. Atherosclerotic plaque. Most notably, there is prominent atherosclerotic plaque about the carotid bifurcation and within the proximal ICA which has progressed from the prior MRA neck of 10/04/2019. Resultant severe (near occlusive) stenosis of the proximal ICA. Tortuosity of the cervical ICA Vertebral arteries: The vertebral arteries are patent within the neck. Streak/beam hardening artifact limits evaluation of the right vertebral artery origin. At least moderate stenosis is suspected at this site. Atherosclerotic plaque scattered elsewhere within the cervical right vertebral artery with no more than mild stenosis. Calcified  atherosclerotic plaque at the left vertebral artery origin with suspected at least moderate stenosis. Nonstenotic calcified plaque elsewhere within the cervical left vertebral artery. Skeleton: Cervical spondylosis. Other neck: No neck mass or cervical lymphadenopathy. Upper chest: No consolidation within the imaged lung apices. Review of the MIP images confirms the above findings CTA HEAD FINDINGS Anterior circulation: The intracranial internal carotid arteries are patent. As sclerotic plaque within both vessels. No more than mild stenosis on the right. Up to moderate stenosis within the left cavernous segment. The M1 middle cerebral arteries are patent. Abrupt occlusion of a proximal M2 left middle cerebral artery vessel (series 11, image 22). Atherosclerotic irregularity of the M2 and more distal MCA vessels elsewhere. The anterior cerebral arteries are patent. Atherosclerotic irregularity of both vessels without high-grade proximal stenosis. A possible 2 mm periophthalmic left ICA aneurysm with better appreciated on the prior MRA head of 10/04/2019. Posterior circulation: The intracranial vertebral arteries are patent. Atherosclerotic plaque within the right V4 segment sites of mild stenosis. Non-stenotic atherosclerotic plaque within the left V4 segment. The basilar artery is patent. The posterior cerebral arteries are patent. Posterior communicating arteries are diminutive or absent, bilaterally. Venous sinuses: Assessment for dural venous sinus thrombosis is limited due to contrast timing. Anatomic variants: As  described. Review of the MIP images confirms the above findings CT Brain Perfusion Findings: ASPECTS: CBF (<30%) Volume: 41mL Perfusion (Tmax>6.0s) volume: 86mL Mismatch Volume: 45mL Infarction Location:Left MCA vascular territory CTA head impression #1, the CT perfusion head impression and the presence of a severe stenosis of the proximal cervical left ICA called by telephone at the time of  interpretation on 06/05/2023 at 8:40 am to provider [facility] , who verbally acknowledged these results. IMPRESSION: CTA neck: 1. No common carotid and internal carotid arteries are patent within the neck. Atherosclerotic plaque bilaterally. Most notably, there is progressive atherosclerotic plaque about the left carotid bifurcation and within the proximal left ICA with resultant severe, near occlusive stenosis of the proximal left ICA. Also of note, atherosclerotic plaque about the right carotid bifurcation results in 40% stenosis at the right ICA origin. 2. The vertebral arteries are patent within the neck. Atherosclerotic plaque bilaterally as described. Most notably, there is suspected at least moderate stenoses at the bilateral vertebral artery origins. 3. Aortic Atherosclerosis (ICD10-I70.0). CTA head: 1. Abrupt occlusion of a proximal M2 left middle cerebral artery vessel. 2. Background intracranial atherosclerotic disease as described. 3. A possible 2 mm periophthalmic left ICA aneurysm was better appreciated on the prior MRA head of 10/04/2019. CT perfusion head: The perfusion software identifies a 41 mL core infarct in the left MCA vascular territory. The perfusion software identifies an 86 mL region of critically hypoperfused parenchyma within the left MCA vascular territory (utilizing the Tmax>6 seconds threshold). Reported mismatch volume: 45 mL Electronically Signed: By: Jackey Loge D.O. On: 06/05/2023 09:07   CT HEAD CODE STROKE WO CONTRAST Result Date: 06/05/2023 CLINICAL DATA:  Code stroke. Neuro deficit, acute, stroke suspected. EXAM: CT HEAD WITHOUT CONTRAST TECHNIQUE: Contiguous axial images were obtained from the base of the skull through the vertex without intravenous contrast. RADIATION DOSE REDUCTION: This exam was performed according to the departmental dose-optimization program which includes automated exposure control, adjustment of the mA and/or kV according to patient size and/or use  of iterative reconstruction technique. COMPARISON:  Brain MRI 10/04/2019.  Noncontrast head CT 10/03/2019. FINDINGS: Brain: Generalized cerebral atrophy. Loss of gray-white differentiation consistent with an acute infarct within the left insula and within portions of the left frontal operculum (MCA vascular territory). Known small chronic cortically-based infarcts within the left frontal, left parietal and left occipital lobes were better appreciated on the prior brain MRI of 10/04/2019 (acute at that time). Mild patchy and ill-defined hypoattenuation within the cerebral white matter, nonspecific but compatible with chronic small vessel ischemic disease. Subcentimeter infarct within the superior right cerebellar hemisphere, new from the prior MRI but chronic in appearance. Loss of gray-white differentiation there is no acute intracranial hemorrhage. No extra-axial fluid collection. No evidence of an intracranial mass. No midline shift. Vascular: No hyperdense vessel.  Atherosclerotic calcifications. Skull: No calvarial fracture or aggressive osseous lesion. Sinuses/Orbits: No mass or acute finding within the imaged orbits. No significant paranasal sinus disease. ASPECTS [facility] Stroke Program Early CT Score) - Ganglionic level infarction (caudate, lentiform nuclei, internal capsule, insula, M1-M3 cortex): 5 - Supraganglionic infarction (M4-M6 cortex): 2 Total score (0-10 with 10 being normal): 7 Impression #1 called by telephone at the time of interpretation on 06/05/2023 at 8:40 am to provider Dr. Otelia Limes, who verbally acknowledged these results. IMPRESSION: 1. Acute left MCA territory infarct affecting the left insula and portions of the left frontal operculum. ASPECTS is 7. 2. Known small chronic cortically-based infarcts within the left frontal, left parietal  and left occipital lobes were better appreciated on the prior brain MRI of 10/04/2019 (acute at that time). 3. Background mild cerebral white matter chronic  small vessel ischemic disease. 4. Subcentimeter infarct within the right cerebellar hemisphere, new from prior MRI but chronic in appearance. 5. Generalized cerebral atrophy. Electronically Signed   By: Jackey Loge D.O.   On: 06/05/2023 08:45     PHYSICAL EXAM  Temp:  [97.1 F (36.2 C)-100 F (37.8 C)] 97.4 F (36.3 C) (02/16 1122) Pulse Rate:  [53-100] 59 (02/16 1122) Resp:  [17-23] 22 (02/16 1122) BP: (65-163)/(20-102) 118/49 (02/16 1122) SpO2:  [96 %-100 %] 100 % (02/16 1122) Arterial Line BP: (84-269)/(41-132) 106/89 (02/16 1122) FiO2 (%):  [50 %-80 %] 50 % (02/16 0748) Weight:  [134.7 kg] 134.7 kg (02/16 0500)  General -intubated elderly patient in no acute distress  Cardiovascular -regular rhythm on monitor with frequent PVCs  Neuro (sedation with propofol and fentanyl) pupils equal and round but small and sluggish corneal reflexes present but weak, cough and gag reflex present.  Patient does not respond to voice or follow commands and has no response to noxious stimuli and no spontaneous movement   ASSESSMENT/PLAN Nathan Proctor. is a 84 y.o. male with history of hypertension, diabetes, CKD 3, stroke admitted for right-sided weakness numbness, aphasia, right facial droop, left gaze preference. No TNK given due to outside window.  Patient underwent mechanical thrombectomy with left ICA stenting.   2/15 in the morning patient had an episode of vomiting with coffee-ground emesis and subsequent tachycardia and tachypnea.  On evaluation this morning, he had increased work of breathing and was unable to maintain his SpO2 even on 100% O2 via nonrebreather.  Discussion was had with patient's wife and daughter, and family stated they would like to proceed with intubation and would like for patient to remain full code at this time.  CCM was consulted, and patient was transferred to ICU and intubated.  He did have an episode of aspiration during intubation and bronchoscopy was  subsequently performed.  He was noted to be hypotensive after intubation, and Levophed and vasopressin were started.  Will hold Plavix for now given coffee-ground emesis and check P2 Y12.  Patient's creatinine was also noted to have worsened to 3.41.  Stroke:  left MCA infarct with left M2 occlusion and left ICA near occlusion s/p IR with TICI3 and confluent HT, likely secondary to large vessel disease source versus cardiomyopathy with low EF CT left MCA infarct CT head and neck left M2 occlusion, left ICA near occlusion, right ICA 43 stenosis, bilateral VA origin severe stenosis CTP 41/86 Status post IR with TICI3 and left ICA stenting MRI left MCA infarct at left insular and left frontal operculum, prominent and confluent petechial hemorrhagic transformation CT repeat 2/7 stable confluent petechial hemorrhage 2D Echo EF 30% LDL 107 HgbA1c 6.1 P2 Y12 = 73 UDS negative Heparin subcu for VTE prophylaxis aspirin 81 mg daily and clopidogrel 75 mg daily prior to admission, now ASA d/c'ed and Plavix on hold given GI bleed Ongoing aggressive stroke risk factor management Therapy recommendations:  SNF Disposition: Pending, will consult palliative care for GOC discussion  History of stroke 10/08/2019 admitted for left MCA infarct due to right upper extremity weakness.  MRA head and neck showed left ICA 30% stenosis.  EF 45 to 50%.  LDL 105, A1c 5.6.  Recommended loop recorder at that time but only got 30-day CardioNet monitoring which was no A-fib.  Discharged on DAPT and Lipitor 80.  Respiratory failure Leukocytosis On 2/15, patient had episode of emesis with probable subsequent aspiration and went into respiratory distress with increased work of breathing unable to maintain SpO2 After discussion with family, patient intubated for airway protection and given respiratory failure Ventilator management per CCM WBC 10.1--23.5--44.5--41.4 Now on cefepime Off vancomycin  GI bleeding Acute blood  loss anemia Patient had 2 episodes of coffee-ground emesis, likely stress ulcer Hold Plavix for now given GI bleed Hemoglobin 11.2-> 8.4->PRBC->8.8 Close monitoring  Cardiomyopathy CHF 09/2019 EF 45 to 50% Current admission EF 30% Cardiology on board, appreciate assistance Was on DAPT, now on hold due to GIB Agree with GDMT (losartan, entresto, metoprolol and spironolactone) as BP and Cr tolerates May consider loop recorder placement if neuro improves significantly  Diabetes HgbA1c 6.1 goal < 7.0 Hyperglycemia and now on insulin drip CBG monitoring SSI  DM education and close PCP follow up  History of hypertension, now hypotensive Unstable on the low end requiring Levophed, epi and vasopressin Long term BP goal normotensive  Hyperlipidemia Home meds: Lipitor 80 LDL 105, goal < 70 Now lipitor 80 and Zetia on hold post GIB Continue statin and zetia at discharge  Dysphagia Poststroke dysphagia Speech on board N.p.o. for now given intubation OG tube reinserted  AKI on CKD, worsened Creatinine 1.78->3.41-> 4.61 Baseline creatinine around 1.5 Renally dose medications as appropriate Avoid contrast Active treatment of hypotension  Other Stroke Risk Factors Advanced age Obesity, Body mass index is 42.61 kg/m.   Other Active Problems Presyncopal episode with mild hypotension 2/14-suspect was due to straining to have a bowel movement, will prescribe as needed suppository  Hospital day # 11  Patient seen by NP with MD, MD to edit note as needed.  Cortney E Ernestina Columbia ,   [facility] See Amion for schedule and pager information 06/16/2023 12:21 PM  ATTENDING NOTE: I reviewed above note and agree with the assessment and plan. Pt was seen and examined.   Wife and daughtar are at the bedside. Pt intubated on sedation and 3 pressors, eyes closed, not following commands. With forced eye opening, eyes in mid position, not blinking to visual threat, doll's  eyes absent, not tracking, pupils 2mm not reactive to light. Corneal reflex weakly present bilaterally, R>L, gag and cough present. Breathing over the vent.  Facial symmetry not able to test due to ET tube.  Tongue protrusion not cooperative. On pain stimulation, no movement in all extremities. Sensation, coordination and gait not tested.  Patient had a dramatic decline after GI bleeding, aspiration and respiratory failure.  Currently hypotension needing 3 pressors, on sedation, with ventilation and worsening renal function and leukocytosis.  Appreciate CCM assistance for vent management, antibiotic management.  Avoid low BP, and blood transfusion as needed.  Continue holding off antiplatelet and statin for now.  However, patient prognosis guarded, recommend palliative care consult for GOC discussion.  For detailed assessment and plan, please refer to above/below as I have made changes wherever appropriate.   Marvel Plan, MD PhD Stroke Neurology 06/16/2023 4:52 PM  This patient is critically ill due to stroke status post IR, respiratory failure, hypotension needing pressor, AKI, aspiration pneumonia and GI bleeding and at significant risk of neurological worsening, death form sepsis, septic shock, severe anemia, heart failure and renal failure. This patient's care requires constant monitoring of vital signs, hemodynamics, respiratory and cardiac monitoring, review of multiple databases, neurological assessment, discussion with family, other specialists and medical decision making  of high complexity. I spent 45 minutes of neurocritical care time in the care of this patient. I had long discussion with daughter and the wife at bedside, updated pt current condition, treatment plan and potential prognosis, and answered all the questions.  They expressed understanding and appreciation.

## 2023-06-16 NOTE — Progress Notes (Signed)
 Pt transported on the ventilator from 4N17 to CT2 and back without complication. RT, RN and Transport accompanied pt.

## 2023-06-16 NOTE — Progress Notes (Signed)
 PCCM Progress Note  CT CAP with bilateral lower lobe consolidation, atelectasis as expected with recent aspiration. Also had findings consistent with SBO and possible closed loop obstruction vs internal hernia. Clinically patient has improved significantly in the last 24 hours with reduced vasopressor need and improved LA to 2.8. Last BM was yesterday and small/smear today. Abdomen exam hypoactive, but soft, no tenderness or guarding  Multiple family members updated including wife on phone. They would want to pursue surgical options if he were to need any in the future. At this time no indication to pursue surgery with benign exam and reassuring biomarkers and recent BM.  Continue OGT to LIWS Trend LA Add miralax

## 2023-06-16 NOTE — Progress Notes (Addendum)
 At 2141, Patient had 6 runs of V-Tach, E-link CCM, Jodie RN notified.   Titrated down on Vasopressin to 0.02u/min  CV strip printed and placed in Chart    Signed,  Charmian Muff, RN  10:49 PM 06/16/2023

## 2023-06-16 NOTE — Plan of Care (Signed)
  Problem: Coping: Goal: Will identify appropriate support needs Outcome: Progressing   Problem: Health Behavior/Discharge Planning: Goal: Goals will be collaboratively established with patient/family Outcome: Progressing   Problem: Cardiovascular: Goal: Ability to achieve and maintain adequate cardiovascular perfusion will improve Outcome: Progressing   Problem: Elimination: Goal: Will not experience complications related to bowel motility Outcome: Progressing

## 2023-06-17 DIAGNOSIS — J690 Pneumonitis due to inhalation of food and vomit: Secondary | ICD-10-CM | POA: Diagnosis not present

## 2023-06-17 DIAGNOSIS — I639 Cerebral infarction, unspecified: Secondary | ICD-10-CM | POA: Diagnosis not present

## 2023-06-17 DIAGNOSIS — Z515 Encounter for palliative care: Secondary | ICD-10-CM | POA: Diagnosis not present

## 2023-06-17 DIAGNOSIS — J9601 Acute respiratory failure with hypoxia: Secondary | ICD-10-CM | POA: Diagnosis not present

## 2023-06-17 DIAGNOSIS — K920 Hematemesis: Secondary | ICD-10-CM | POA: Diagnosis not present

## 2023-06-17 DIAGNOSIS — Z7189 Other specified counseling: Secondary | ICD-10-CM

## 2023-06-17 LAB — CBC
HCT: 25.4 % — ABNORMAL LOW (ref 39.0–52.0)
HCT: 25.1 % — ABNORMAL LOW (ref 39.0–52.0)
Hemoglobin: 8.1 g/dL — ABNORMAL LOW (ref 13.0–17.0)
Hemoglobin: 8 g/dL — ABNORMAL LOW (ref 13.0–17.0)
MCH: 31.4 pg (ref 26.0–34.0)
MCH: 31.5 pg (ref 26.0–34.0)
MCHC: 31.9 g/dL (ref 30.0–36.0)
MCHC: 31.9 g/dL (ref 30.0–36.0)
MCV: 98.4 fL (ref 80.0–100.0)
MCV: 98.8 fL (ref 80.0–100.0)
Platelets: 157 10*3/uL (ref 150–400)
Platelets: 146 10*3/uL — ABNORMAL LOW (ref 150–400)
RBC: 2.58 MIL/uL — ABNORMAL LOW (ref 4.22–5.81)
RBC: 2.54 MIL/uL — ABNORMAL LOW (ref 4.22–5.81)
RDW: 14.3 % (ref 11.5–15.5)
RDW: 14.1 % (ref 11.5–15.5)
WBC: 29.9 10*3/uL — ABNORMAL HIGH (ref 4.0–10.5)
WBC: 25 10*3/uL — ABNORMAL HIGH (ref 4.0–10.5)
nRBC: 0 % (ref 0.0–0.2)
nRBC: 0 % (ref 0.0–0.2)

## 2023-06-17 LAB — MAGNESIUM: Magnesium: 2.1 mg/dL (ref 1.7–2.4)

## 2023-06-17 LAB — COOXEMETRY PANEL
Carboxyhemoglobin: 0.7 % (ref 0.5–1.5)
Methemoglobin: 0.7 % (ref 0.0–1.5)
O2 Saturation: 77.5 %
Total hemoglobin: 7.9 g/dL — ABNORMAL LOW (ref 12.0–16.0)

## 2023-06-17 LAB — COMPREHENSIVE METABOLIC PANEL
ALT: 29 U/L (ref 0–44)
AST: 35 U/L (ref 15–41)
Albumin: 2.2 g/dL — ABNORMAL LOW (ref 3.5–5.0)
Alkaline Phosphatase: 46 U/L (ref 38–126)
Anion gap: 14 (ref 5–15)
BUN: 67 mg/dL — ABNORMAL HIGH (ref 8–23)
CO2: 20 mmol/L — ABNORMAL LOW (ref 22–32)
Calcium: 8.6 mg/dL — ABNORMAL LOW (ref 8.9–10.3)
Chloride: 109 mmol/L (ref 98–111)
Creatinine, Ser: 4.07 mg/dL — ABNORMAL HIGH (ref 0.61–1.24)
GFR, Estimated: 14 mL/min — ABNORMAL LOW (ref >60)
Glucose, Bld: 150 mg/dL — ABNORMAL HIGH (ref 70–99)
Potassium: 4.6 mmol/L (ref 3.5–5.1)
Sodium: 143 mmol/L (ref 135–145)
Total Bilirubin: 1.1 mg/dL (ref 0.0–1.2)
Total Protein: 5.4 g/dL — ABNORMAL LOW (ref 6.5–8.1)

## 2023-06-17 LAB — GLUCOSE, CAPILLARY
Glucose-Capillary: 142 mg/dL — ABNORMAL HIGH (ref 70–99)
Glucose-Capillary: 147 mg/dL — ABNORMAL HIGH (ref 70–99)
Glucose-Capillary: 144 mg/dL — ABNORMAL HIGH (ref 70–99)
Glucose-Capillary: 135 mg/dL — ABNORMAL HIGH (ref 70–99)
Glucose-Capillary: 123 mg/dL — ABNORMAL HIGH (ref 70–99)
Glucose-Capillary: 133 mg/dL — ABNORMAL HIGH (ref 70–99)
Glucose-Capillary: 148 mg/dL — ABNORMAL HIGH (ref 70–99)
Glucose-Capillary: 147 mg/dL — ABNORMAL HIGH (ref 70–99)
Glucose-Capillary: 183 mg/dL — ABNORMAL HIGH (ref 70–99)
Glucose-Capillary: 220 mg/dL — ABNORMAL HIGH (ref 70–99)
Glucose-Capillary: 218 mg/dL — ABNORMAL HIGH (ref 70–99)

## 2023-06-17 LAB — PHOSPHORUS: Phosphorus: 4.8 mg/dL — ABNORMAL HIGH (ref 2.5–4.6)

## 2023-06-17 LAB — VANCOMYCIN, RANDOM: Vancomycin Rm: 15 ug/mL

## 2023-06-17 MED ORDER — MEDIHONEY WOUND/BURN DRESSING EX PSTE
1.0000 | PASTE | Freq: Every day | CUTANEOUS | Status: DC
Start: 2023-06-17 — End: 2023-07-03
  Administered 2023-06-17 – 2023-07-03 (×17): 1 via TOPICAL
  Filled 2023-06-17: qty 44

## 2023-06-17 MED ORDER — INSULIN GLARGINE-YFGN 100 UNIT/ML ~~LOC~~ SOLN
4.0000 [IU] | Freq: Two times a day (BID) | SUBCUTANEOUS | Status: DC
  Administered 2023-06-17 – 2023-06-19 (×6): 4 [IU] via SUBCUTANEOUS
  Filled 2023-06-17 (×8): qty 0.04

## 2023-06-17 MED ORDER — ZINC OXIDE 40 % EX OINT
TOPICAL_OINTMENT | Freq: Two times a day (BID) | CUTANEOUS | Status: DC
  Administered 2023-06-24 – 2023-07-04 (×8): 1 via TOPICAL
  Filled 2023-06-17 (×2): qty 57

## 2023-06-17 MED ORDER — INSULIN ASPART 100 UNIT/ML IJ SOLN
0.0000 [IU] | INTRAMUSCULAR | Status: DC
  Administered 2023-06-17: 7 [IU] via SUBCUTANEOUS
  Administered 2023-06-17: 3 [IU] via SUBCUTANEOUS
  Administered 2023-06-17: 4 [IU] via SUBCUTANEOUS
  Administered 2023-06-18: 3 [IU] via SUBCUTANEOUS
  Administered 2023-06-18: 7 [IU] via SUBCUTANEOUS
  Administered 2023-06-18: 4 [IU] via SUBCUTANEOUS
  Administered 2023-06-18 (×3): 3 [IU] via SUBCUTANEOUS
  Administered 2023-06-19: 7 [IU] via SUBCUTANEOUS
  Administered 2023-06-19 (×2): 4 [IU] via SUBCUTANEOUS
  Administered 2023-06-19: 7 [IU] via SUBCUTANEOUS
  Administered 2023-06-19: 4 [IU] via SUBCUTANEOUS
  Administered 2023-06-19: 7 [IU] via SUBCUTANEOUS
  Administered 2023-06-20: 11 [IU] via SUBCUTANEOUS
  Administered 2023-06-20: 7 [IU] via SUBCUTANEOUS
  Administered 2023-06-20: 3 [IU] via SUBCUTANEOUS
  Administered 2023-06-20 (×2): 4 [IU] via SUBCUTANEOUS
  Administered 2023-06-21: 7 [IU] via SUBCUTANEOUS
  Administered 2023-06-21: 11 [IU] via SUBCUTANEOUS
  Administered 2023-06-21 (×3): 7 [IU] via SUBCUTANEOUS
  Administered 2023-06-21: 11 [IU] via SUBCUTANEOUS
  Administered 2023-06-21: 7 [IU] via SUBCUTANEOUS
  Administered 2023-06-22 (×5): 11 [IU] via SUBCUTANEOUS
  Administered 2023-06-23: 7 [IU] via SUBCUTANEOUS
  Administered 2023-06-23: 11 [IU] via SUBCUTANEOUS
  Administered 2023-06-23: 7 [IU] via SUBCUTANEOUS
  Administered 2023-06-23: 4 [IU] via SUBCUTANEOUS
  Administered 2023-06-23 – 2023-06-24 (×3): 7 [IU] via SUBCUTANEOUS
  Administered 2023-06-24: 15 [IU] via SUBCUTANEOUS
  Administered 2023-06-24: 11 [IU] via SUBCUTANEOUS
  Administered 2023-06-24 (×2): 7 [IU] via SUBCUTANEOUS
  Administered 2023-06-24: 11 [IU] via SUBCUTANEOUS
  Administered 2023-06-25: 3 [IU] via SUBCUTANEOUS
  Administered 2023-06-25: 11 [IU] via SUBCUTANEOUS
  Administered 2023-06-25: 3 [IU] via SUBCUTANEOUS
  Administered 2023-06-25: 4 [IU] via SUBCUTANEOUS
  Administered 2023-06-25: 11 [IU] via SUBCUTANEOUS
  Administered 2023-06-25: 7 [IU] via SUBCUTANEOUS
  Administered 2023-06-26: 4 [IU] via SUBCUTANEOUS
  Administered 2023-06-26: 11 [IU] via SUBCUTANEOUS
  Administered 2023-06-26: 7 [IU] via SUBCUTANEOUS
  Administered 2023-06-26: 11 [IU] via SUBCUTANEOUS
  Administered 2023-06-26: 4 [IU] via SUBCUTANEOUS
  Administered 2023-06-26: 7 [IU] via SUBCUTANEOUS
  Administered 2023-06-27 (×2): 4 [IU] via SUBCUTANEOUS
  Administered 2023-06-27: 7 [IU] via SUBCUTANEOUS
  Administered 2023-06-27: 3 [IU] via SUBCUTANEOUS
  Administered 2023-06-27: 4 [IU] via SUBCUTANEOUS
  Administered 2023-06-28: 7 [IU] via SUBCUTANEOUS
  Administered 2023-06-28: 11 [IU] via SUBCUTANEOUS
  Administered 2023-06-28: 7 [IU] via SUBCUTANEOUS
  Administered 2023-06-28: 4 [IU] via SUBCUTANEOUS
  Administered 2023-06-28: 15 [IU] via SUBCUTANEOUS
  Administered 2023-06-29: 7 [IU] via SUBCUTANEOUS
  Administered 2023-06-29 (×2): 4 [IU] via SUBCUTANEOUS
  Administered 2023-06-29: 3 [IU] via SUBCUTANEOUS
  Administered 2023-06-30 (×3): 7 [IU] via SUBCUTANEOUS
  Administered 2023-07-01: 4 [IU] via SUBCUTANEOUS
  Administered 2023-07-01: 7 [IU] via SUBCUTANEOUS
  Administered 2023-07-01 (×2): 4 [IU] via SUBCUTANEOUS
  Administered 2023-07-01 (×3): 7 [IU] via SUBCUTANEOUS
  Administered 2023-07-02: 15 [IU] via SUBCUTANEOUS
  Administered 2023-07-02: 4 [IU] via SUBCUTANEOUS
  Administered 2023-07-02: 11 [IU] via SUBCUTANEOUS
  Administered 2023-07-02: 15 [IU] via SUBCUTANEOUS
  Administered 2023-07-02 – 2023-07-03 (×2): 11 [IU] via SUBCUTANEOUS
  Administered 2023-07-03 (×3): 15 [IU] via SUBCUTANEOUS
  Administered 2023-07-03 – 2023-07-04 (×4): 11 [IU] via SUBCUTANEOUS
  Administered 2023-07-04: 7 [IU] via SUBCUTANEOUS
  Administered 2023-07-04: 15 [IU] via SUBCUTANEOUS
  Administered 2023-07-04 (×2): 11 [IU] via SUBCUTANEOUS
  Administered 2023-07-05: 4 [IU] via SUBCUTANEOUS
  Administered 2023-07-05 (×2): 7 [IU] via SUBCUTANEOUS

## 2023-06-17 MED ORDER — EZETIMIBE 10 MG PO TABS
10.0000 mg | ORAL_TABLET | Freq: Every day | ORAL | Status: DC
  Administered 2023-06-17 – 2023-07-05 (×18): 10 mg
  Filled 2023-06-17 (×19): qty 1

## 2023-06-17 MED ORDER — ATORVASTATIN CALCIUM 80 MG PO TABS
80.0000 mg | ORAL_TABLET | Freq: Every day | ORAL | Status: DC
  Administered 2023-06-17 – 2023-07-05 (×18): 80 mg
  Filled 2023-06-17 (×19): qty 1

## 2023-06-17 NOTE — Progress Notes (Signed)
   Progress Note   Date: 06/17/2023  Patient Name: Nathan Proctor.        MRN#: [id number]   Clarification of the diagnosis of pressure ulcer(s):   Clinically undetermined if the pressure injury was present on admission

## 2023-06-17 NOTE — Plan of Care (Signed)
  Problem: Education: Goal: Knowledge of disease or condition will improve Outcome: Progressing   Problem: Ischemic Stroke/TIA Tissue Perfusion: Goal: Complications of ischemic stroke/TIA will be minimized Outcome: Progressing   Problem: Health Behavior/Discharge Planning: Goal: Goals will be collaboratively established with patient/family Outcome: Progressing   Problem: Nutrition: Goal: Risk of aspiration will decrease Outcome: Progressing   Problem: Clinical Measurements: Goal: Diagnostic test results will improve Outcome: Progressing   Problem: Activity: Goal: Risk for activity intolerance will decrease Outcome: Progressing   Problem: Pain Managment: Goal: General experience of comfort will improve and/or be controlled Outcome: Progressing   Problem: Safety: Goal: Ability to remain free from injury will improve Outcome: Progressing

## 2023-06-17 NOTE — Progress Notes (Signed)
 PT Cancellation Note  Patient Details Name: Nathan Proctor. MRN: [id number] DOB: March 12, 1941   Cancelled Treatment:    Reason Eval/Treat Not Completed: Medical issues which prohibited therapy - pt intubated, sedated, on x3 pressors. PT to check back when medically ready.   Marye Round, PT DPT Acute Rehabilitation Services Secure Chat Preferred  Office [phone]    Truddie Coco 06/17/2023, 12:43 PM

## 2023-06-17 NOTE — Progress Notes (Incomplete)
 Nutrition Follow-up  DOCUMENTATION CODES:   Non-severe (moderate) malnutrition in context of chronic illness  INTERVENTION:   Initiate tube feeding when medically appropriate:  Osmolite 1.5 @ 20 ml/hr at 20 ml/h (*** ml per day)  Prosource TF20 *** ml ***  Provides *** kcal, *** gm protein, *** ml free water daily   NUTRITION DIAGNOSIS:   Moderate Malnutrition related to chronic illness as evidenced by severe muscle depletion, mild fat depletion, severe fat depletion.  - Still applicable   GOAL:   Patient will meet greater than or equal to 90% of their needs  - Ongoing   MONITOR:   TF tolerance, Weight trends, Labs, Diet advancement  REASON FOR ASSESSMENT:   Consult Enteral/tube feeding initiation and management  ASSESSMENT:   Pt admitted for acute ischemic stroke with right sides weakness, right facial droop, and aphagia. Pt with PMH of HTN, DM, CKD III, prior CVA w/ no residual deficits.  2/05 - s/p IR mechanical thrombectomy and carotid stent placement, Cortrak placed (tip gastric) 2/07 - s/p MBS with recommendation for dysphagia 1 diet with honey-thick liquids, Transferred to progressive unit 2/09 - Cortrak and tube feeds discontinued 2/12 - repeat MBS with recommendation for dysphagia 2 diet with thin liquids 2/15 - Transferred to ICU for respiratory distress, Intubated    Patient transferred to ICU and intubated for respiratory distress along with coffee-ground emesis concerning for possible GI bleed. GIB seems to have resolved. On epinephrine @ 55ml/hr, levo @ 3.75 ml/hr, vaso @ 6 ml/hr.   No family bedside. Spoke to RN who reports he has a possible bowel obstruction and are waiting for a bowel movement, no tube feeds today. No concerns for refeeding as patient was eating 75-100% of his meals when on a diet.   Patient is currently intubated on ventilator support MV: 12.2 L/min Temp (24hrs), Avg:97.3 F (36.3 C), Min:96.2 F (35.7 C), Max:97.6 F (36.4  C)  Propofol: 28.7 ml/hr  Admit weight: 132.6 kg   Current weight: 132.6 kg   Intake/Output Summary (Last 24 hours) at 06/17/2023 1610 Last data filed at 06/17/2023 0900 Gross per 24 hour  Intake 2051.4 ml  Output 2185 ml  Net -133.6 ml   Net IO Since Admission: 5,293.31 mL [06/17/23 0926]  Drains/Lines: NGT output: 400 ml x 24 hours   Average Meal Intake: 2/7-2/13: 75-100% intake x 8 recorded meals  Nutritionally Relevant Medications: Scheduled Meds:  multivitamin  1 tablet Per Tube QHS   vancomycin variable dose per unstable renal function (pharmacist dosing)   Does not apply See admin instructions   Continuous Infusions:  ceFEPime (MAXIPIME) IV Stopped (06/16/23 1542)   epinephrine 1 mcg/min (06/17/23 0900)   fentaNYL infusion INTRAVENOUS 125 mcg/hr (06/17/23 0900)   insulin 1.9 Units/hr (06/17/23 0900)   norepinephrine (LEVOPHED) Adult infusion 4 mcg/min (06/17/23 0900)   propofol (DIPRIVAN) infusion 40 mcg/kg/min (06/17/23 0900)   vasopressin 0.02 Units/min (06/17/23 0900)   Labs Reviewed: BUN 67, Creatinine 4.07, Calcium 8.6, Phosphorus 4.8,  CBG ranges from 123-157 mg/dL over the last 24 hours HgbA1c 6.1  NUTRITION - FOCUSED PHYSICAL EXAM:  Flowsheet Row Most Recent Value  Orbital Region Mild depletion  Upper Arm Region Severe depletion  Thoracic and Lumbar Region Mild depletion  Buccal Region Mild depletion  Temple Region Moderate depletion  Clavicle Bone Region Mild depletion  Clavicle and Acromion Bone Region Mild depletion  Scapular Bone Region Moderate depletion  Dorsal Hand Severe depletion  Patellar Region Severe depletion  Anterior Thigh Region  Severe depletion  Posterior Calf Region Mild depletion  [edema present]  Edema (RD Assessment) Moderate  [BLE]  Hair Reviewed  Eyes Reviewed  Mouth Reviewed  Skin Reviewed  Nails Reviewed       Diet Order:   Diet Order             Diet NPO time specified  Diet effective now                    EDUCATION NEEDS:   Education needs have been addressed  Skin:  Skin Assessment: Skin Integrity Issues: Skin Integrity Issues:: Stage II, Incisions Stage II: L scrotum Incisions: R thigh  Last BM:  06/13/23 multiple type 6  Height:   Ht Readings from Last 1 Encounters:  06/13/23 5\' 10"  (1.778 m)    Weight:   Wt Readings from Last 1 Encounters:  06/17/23 132.6 kg    Ideal Body Weight:  68.2 kg  BMI:  Body mass index is 41.94 kg/m.  Estimated Nutritional Needs:   Kcal:  2300-2500  Protein:  115-130 g  Fluid:  2.4 L   Elliot Dally, RD Registered Dietitian  See Amion for more information

## 2023-06-17 NOTE — Progress Notes (Signed)
 NAME:  Nathan Proctor., MRN:  [id number], DOB:  [birth year], LOS: 12 ADMISSION DATE:  06/05/2023 CONSULTATION DATE:  06/05/2023 REFERRING MD:  Otelia Limes - Neuro, CHIEF COMPLAINT: Code Stroke, post-NIR   History of Present Illness:  83 year old man who presented to [facility] ED 2/5 via EMS for unresponsiveness and R-sided deficits. PMHx significant for HTN, HLD, prior CVA without residual deficits, T2DM, CKD stage IIIb, prostate CA, gout.  Patient presented to [facility] ED with significant R-sided deficits, aphasia and poor responsiveness. LKW 2200. Code Stroke initiated. On ED arrival, patient was afebrile with HR 86, BP 154/67, RR 20, SpO2 100%. Noted aphasia, R-sided hemiplegia, R-sided facial droop. Labs were notable for WBC 6.8. Hgb 11.9, Plt 181. INR 1.0. Na 142, K 3.8, CO2 20, Cr 1.59 (baseline), LFTs WNL. Ethanol < 10. COVID negative. CT Head with acute L MCA territory infarct affecting L insula/L frontal operculum, small chronic cortically-based infarcts of L frontal/L parietal/L occipital lobes. CTA Head/Neck with abrupt occlusion of proximal M2 L MCA vessel, ?2mm periophthalmic L ICA aneurysm, bilateral ICA plaque with progressive atherosclerotic plaque L carotid bifurcation with proximal L ICA severe, near-occlusive stenosis.   Taken to [facility] emergently for mechanical thrombectomy ([name]). TICI 3 revascularization achieved. L ICA stenosis and plaque rupture noted at the bulb, treated with stenting/angioplasty. Extubated post-procedure and maintained on cangrelor gtt with plan for transition to DAPT.  PCCM consulted for post-procedure medical management.  Pertinent Medical History:   Past Medical History:  Diagnosis Date   Anemia of chronic disease    Ankle fracture 02/15/2016   Cancer (HCC)    Prostate   Chronic constipation    Chronic kidney disease    stage III   Diabetes mellitus without complication (HCC)    type II    Diabetic peripheral neuropathy (HCC)    Failure to  thrive (0-17)    Fracture of left lower leg    Gout    Hyperlipidemia    Hypertension    Morbid obesity (HCC)    Unstable gait    Significant Hospital Events: Including procedures, antibiotic start and stop dates in addition to other pertinent events   2/5 - Presented to [facility] ED with poor responsiveness, aphasia, R-sided deficits. CT Head with L MCA territory infarct. CTA Head/Neck with occlusion of L MCA M2 segment and severe near-occlusive stenosis of L ICA. Taken to [facility] for MT, stent and angioplasty ([name]). PCCM consulted for post-procedure management. 2/6 transfer out of ICU 2/15 support aspiration event early a.m. resulting in respiratory distress, transferred back to ICU and emergently intubated with development of severe shock requiring epi, levo, vaso. Transfused PRBC x1 for possible GI bleed  Interim History / Subjective:  Remains on 3 pressors On epinephrine, Levophed, vasopressin  Objective:  Blood pressure (!) 144/57, pulse (!) 56, temperature (!) 97.5 F (36.4 C), temperature source Axillary, resp. rate (!) 22, height 5\' 10"  (1.778 m), weight 132.6 kg, SpO2 97%.    Vent Mode: PRVC FiO2 (%):  [40 %] 40 % Set Rate:  [22 bmp] 22 bmp Vt Set:  [580 mL] 580 mL PEEP:  [5 cmH20-8 cmH20] 5 cmH20 Plateau Pressure:  [17 cmH20] 17 cmH20   Intake/Output Summary (Last 24 hours) at 06/17/2023 0814 Last data filed at 06/17/2023 0700 Gross per 24 hour  Intake 2449.65 ml  Output 2080 ml  Net 369.65 ml   Filed Weights   06/15/23 0500 06/16/23 0500 06/17/23 0500  Weight: 119.7 kg 134.7 kg  132.6 kg   Physical Exam: General: Chronically ill-appearing, does not appear to be in acute distress HENT: Endotracheal tube in place, moist oral mucosa Eyes: Not pale Respiratory: Decreased air movement, clear Cardiovascular: S1-S2 appreciated no murmur GI: Bowel sounds appreciated Extremities: No edema, no clubbing Neuro: Sedated, pinpoint pupils, CNII-XII grossly intact GU:  Foley in place  I reviewed nursing notes, Consultant notes, last 24 h vitals and pain scores, last 48 h intake and output, last 24 h labs and trends, and last 24 h imaging results.  Resolved Hospital Problem List:    Assessment & Plan:   Acute hypoxemic respiratory failure Aspiration pneumonia -Continue mechanical ventilation  -Target TVol 6-8cc/kgIBW -Target Plateau Pressure < 30cm H20 -Target driving pressure less than 15 cm of water -Target PaO2 55-65: titrate PEEP/FiO2 per protocol -Ventilator associated pneumonia prevention protocol -Pulmonary hygiene  Sedation and agitation with propofol and fentanyl  For aspiration pneumonia on vancomycin and cefepime started 2/15 -Abundant gram-negative rods on BAL  Coffee-ground emesis with concern for acute GI bleed -H&H is stable -Aspirin and Plavix on hold -Continue PPI twice a day  Septic shock secondary to aspiration pneumonia Cardiogenic shock -Continue pressors, wean as tolerated -Continue antibiotics  Left MCA M2 occlusion and left ICA near occlusion now status post thrombectomy with complete revascularization Likely secondary to large vessel obstruction History of cardiomyopathy with low ejection fraction -Seizure precautions -Neuroprotective measures- normothermia, euglycemia, HOB greater than 30, head in neutral alignment, normocapnia, normoxia -Aspirin and Plavix was on hold  Heart failure with reduced ejection fraction ejection fraction of 30% with global hypokinesis, normal right ventricular function Hypertension Hyperlipidemia -Continue hemodynamic support -Continue telemetry monitoring  Type 2 diabetes Hyperglycemia -Continue SSI  Acute kidney injury superimposed on chronic kidney disease stage IIIb -Did receive Lokelma -Maintain renal perfusion  -avoid nephrotoxic medications BUN/creatinine seems to be stabilizing still high -Does not need renal replacement therapy at present  Leukocytosis related to  aspiration -Improving -Continue current antibiotic therapy  Best Practice (right click and "Reselect all SmartList Selections" daily)   Diet/type: NPO DVT prophylaxis SCD Pressure ulcer(s): N/A GI prophylaxis: PPI Lines: N/A Foley:  Yes, and it is still needed Code Status:  full code Last date of multidisciplinary goals of care discussion: Continue to update patient and family daily   The patient is critically ill with multiple organ systems failure and requires high complexity decision making for assessment and support, frequent evaluation and titration of therapies, application of advanced monitoring technologies and extensive interpretation of multiple databases. Critical Care Time devoted to patient care services described in this note independent of APP/resident time (if applicable)  is 33 minutes.   Virl Diamond MD [facility] Personal pager: See Amion If unanswered, please page CCM On-call: [phone]

## 2023-06-17 NOTE — Consult Note (Signed)
 Consultation Note Date: 06/17/2023   Patient Name: Nathan Proctor.  DOB: 14-Aug-1940  MRN: [id number]  Age / Sex: 83 y.o., male  PCP: Fleet Contras, MD Referring Physician: Stroke, Md, MD  Reason for Consultation: Establishing goals of care  HPI/Patient Profile: 83 y.o. male  with past medical history of HTN, HLD, diabetes, CKD stage 3b, prior CVA with no residual deficits, prostate cancer, failure to thrive admitted on 06/05/2023 with unresponsiveness with right-sided deficits found to have left MCA infarct with left M2 occlusion and left ICA near occlusion s/p IR with mechanical thrombectomy and stenting 2/5. 2/15 aspiration event resulting in severe shock requiring intubation. Hospitalization also complicated by acute GIB and CHF EF 30%.    Clinical Assessment and Goals of Care: Consult received and chart reviewed completed. I discussed with Dr. Roda Shutters and RN. I met today at Nathan Proctor' bedside along with wife and daughter. I reviewed with wife Nathan Proctor' baseline prior to admission. He was independent, driving, and still working at times. He had previous strokes but had no deficits. Nathan Proctor reports that he had some health issues they were monitoring but he seemed stable. She noticed he was not responding and called EMS. They report that he seemed to be progressing with plans for transition to SNF rehab. Nathan Proctor' shares that he was eating and doing well on pureed diet. She reports that his diet was upgraded and that RN was encouraging him to eat and when not wanting to eat encouraged Ensure. Nathan Proctor shares regret that he was encouraged for intake when there was a reason he was not wanting intake.  We reviewed his current status and severity of illness. We reviewed concern for his status and ongoing need for significant support with limited improvement. Nathan Proctor shares her hope and faith  that he will improve. We discussed the importance of having hope - hoping for the best but also acknowledging the reality that we must also prepare for the worse. We reviewed the reality that we do not know what the future holds for [name]. We discussed the medical team doing all they can to support [name] but ultimately his body will need to be strong enough to utilize the resources we are giving his body to have improvement. We reviewed the importance of monitoring closely for signs of hope that we are seeing signs of improvement but also monitoring for signs of worsening. We discussed Nathan Proctor' only stated wishes when Nathan Proctor' sister had a stroke and family elected to allow natural death after 1 week on ventilator support he felt this was too soon. Nathan Proctor stays committed to ensuring Nathan Proctor has every opportunity to improve.   Mr. And Mrs. Proctor have been married 47 years. They have 7 children together, 20 grandchildren, and 4 great grandchildren. 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 Nathan Proctor is marrying her science brain along with her faith to try and make sense of what  is going on with her husband. I reassured her that palliative will continue to support them and ensure that we all stay on the same page on how we can best care for her husband.   All questions/concerns addressed to the best of my ability. Emotional support provided.   Primary Decision Maker NEXT OF KIN wife [name]    SUMMARY OF RECOMMENDATIONS   - Ongoing support - Full scope care - Time for progress and outcomes  Code Status/Advance Care Planning: Full code   Symptom Management:  Per stroke team, PCCM  Prognosis:  Overall prognosis poor.   Discharge Planning: To Be Determined      Primary Diagnoses: Present on Admission:  Acute ischemic stroke (HCC)  Acute systolic heart failure (HCC)  Primary  hypertension   I have reviewed the medical record, interviewed the patient and family, and examined the patient. The following aspects are pertinent.  Past Medical History:  Diagnosis Date   Anemia of chronic disease    Ankle fracture 02/15/2016   Cancer (HCC)    Prostate   Chronic constipation    Chronic kidney disease    stage III   Diabetes mellitus without complication (HCC)    type II    Diabetic peripheral neuropathy (HCC)    Failure to thrive (0-17)    Fracture of left lower leg    Gout    Hyperlipidemia    Hypertension    Morbid obesity (HCC)    Unstable gait    Social History   Socioeconomic History   Marital status: Married    Spouse name: Not on file   Number of children: Not on file   Years of education: Not on file   Highest education level: Not on file  Occupational History   Not on file  Tobacco Use   Smoking status: Former    Current packs/day: 0.00    Types: Cigarettes    Quit date: 98    Years since quitting: 46.1   Smokeless tobacco: Never  Vaping Use   Vaping status: Former  Substance and Sexual Activity   Alcohol use: Not Currently    Comment: Occasionally   Drug use: No   Sexual activity: Yes  Other Topics Concern   Not on file  Social History Narrative   Not on file   Social Drivers of Health   Financial Resource Strain: Not on file  Food Insecurity: Patient Unable To Answer (06/07/2023)   Hunger Vital Sign    Worried About Running Out of Food in the Last Year: Patient unable to answer    Ran Out of Food in the Last Year: Patient unable to answer  Transportation Needs: Patient Unable To Answer (06/07/2023)   PRAPARE - Transportation    Lack of Transportation (Medical): Patient unable to answer    Lack of Transportation (Non-Medical): Patient unable to answer  Physical Activity: Not on file  Stress: Not on file  Social Connections: Patient Unable To Answer (06/07/2023)   Social Connection and Isolation Panel [NHANES]    Frequency  of Communication with Friends and Family: Patient unable to answer    Frequency of Social Gatherings with Friends and Family: Patient unable to answer    Attends Religious Services: Patient unable to answer    Active Member of Clubs or Organizations: Patient unable to answer    Attends Banker Meetings: Patient unable to answer    Marital Status: Patient unable to answer   Family History  Problem  Relation Age of Onset   Heart Problems Father    Heart Problems Sister    Scheduled Meds:  sodium chloride   Intravenous Once   Chlorhexidine Gluconate Cloth  6 each Topical Daily   clopidogrel  75 mg Per Tube QPM   hydrocortisone sod succinate (SOLU-CORTEF) inj  100 mg Intravenous Q12H   insulin aspart  0-20 Units Subcutaneous Q4H   insulin glargine-yfgn  4 Units Subcutaneous BID   multivitamin  1 tablet Per Tube QHS   mouth rinse  15 mL Mouth Rinse Q2H   pantoprazole (PROTONIX) IV  40 mg Intravenous Q12H   sodium chloride flush  3 mL Intravenous Once   Continuous Infusions:  ceFEPime (MAXIPIME) IV Stopped (06/16/23 1542)   epinephrine 1 mcg/min (06/17/23 1232)   fentaNYL infusion INTRAVENOUS 125 mcg/hr (06/17/23 1200)   insulin 1.7 Units/hr (06/17/23 1200)   norepinephrine (LEVOPHED) Adult infusion 4 mcg/min (06/17/23 1200)   propofol (DIPRIVAN) infusion 40 mcg/kg/min (06/17/23 1226)   vasopressin 0.02 Units/min (06/17/23 1200)   PRN Meds:.acetaminophen **OR** acetaminophen (TYLENOL) oral liquid 160 mg/5 mL **OR** acetaminophen, bisacodyl, dextrose, fentaNYL, ipratropium-albuterol, mouth rinse, mouth rinse, phenylephrine, polyethylene glycol, senna-docusate No Known Allergies Review of Systems  Unable to perform ROS: Acuity of condition    Physical Exam Vitals and nursing note reviewed.  Constitutional:      Appearance: He is morbidly obese. He is ill-appearing.     Interventions: He is sedated and intubated.  Cardiovascular:     Rate and Rhythm: Normal rate.   Pulmonary:     Effort: No tachypnea, accessory muscle usage or respiratory distress. He is intubated.  Abdominal:     Palpations: Abdomen is soft.  Neurological:     Comments: Sedated on vent; does not awaken, open eyes, or follow commands.      Vital Signs: BP 119/67   Pulse 60   Temp (!) 97.5 F (36.4 C) (Axillary)   Resp (!) 22   Ht 5\' 10"  (1.778 m)   Wt 132.6 kg   SpO2 98%   BMI 41.94 kg/m  Pain Scale: CPOT POSS *See Group Information*: 1-Acceptable,Awake and alert Pain Score: 0-No pain   SpO2: SpO2: 98 % O2 Device:SpO2: 98 % O2 Flow Rate: .O2 Flow Rate (L/min): 15 L/min  IO: Intake/output summary:  Intake/Output Summary (Last 24 hours) at 06/17/2023 1234 Last data filed at 06/17/2023 1200 Gross per 24 hour  Intake 1578.96 ml  Output 2280 ml  Net -701.04 ml    LBM: Last BM Date : 06/15/23 Baseline Weight: Weight: 132.6 kg Most recent weight: Weight: 132.6 kg     Palliative Assessment/Data:    Time Total: 90 min  Greater than 50%  of this time was spent counseling and coordinating care related to the above assessment and plan.  Signed by: Yong Channel, NP Palliative Medicine Team Pager # [phone] (M-F 8a-5p) Team Phone # [phone] (Nights/Weekends)

## 2023-06-17 NOTE — Inpatient Diabetes Management (Signed)
 Inpatient Diabetes Program Recommendations  AACE/ADA: New Consensus Statement on Inpatient Glycemic Control (2015)  Target Ranges:  Prepandial:   less than 140 mg/dL      Peak postprandial:   less than 180 mg/dL (1-2 hours)      Critically ill patients:  140 - 180 mg/dL    Latest Reference Range & Units 06/05/23 15:00  Hemoglobin A1C 4.8 - 5.6 % 6.1 (H)  (H): Data is abnormally high  Latest Reference Range & Units 06/16/23 20:34 06/16/23 21:35 06/16/23 22:37 06/16/23 23:15 06/16/23 23:39 06/17/23 01:49 06/17/23 03:52 06/17/23 05:54 06/17/23 07:51 06/17/23 09:52  Glucose-Capillary 70 - 99 mg/dL 829 (H)  IV Insulin Drip 143 (H) 136 (H) 143 (H) 134 (H) 142 (H) 147 (H) 144 (H) 135 (H) 123 (H)  (H): Data is abnormally high   Admit with: Code Stroke  Acute hypoxemic respiratory failure Aspiration pneumonia  History: DM2  Home DM Meds: 70/30 Insulin 5 units daily (NOT taking)        Jardiance 10 mg daily (NOT taking)  Current Orders: IV Insulin Drip    MD- Note pt has been on the IV Insulin Drip since 02/15 Getting Solucortef 100 mg BID CBGs now consistently less than 180 IV Insulin Drip Rate <8 units/hr  If you want pt to transition to SQ Insulin today, please consider using the Transition orders Phase 3 of the ICU Glycemic Control order set: Recommend Semglee 5 units BID Novolog 2-4-6 units Q4hours    --Will follow patient during hospitalization--  Ambrose Finland RN, MSN, CDCES Diabetes Coordinator Inpatient Glycemic Control Team Team Pager: [phone] (8a-5p)

## 2023-06-17 NOTE — TOC Progression Note (Signed)
 Transition of Care (TOC) - Progression Note    Patient Details  Name: Nathan Proctor. MRN: [id number] Date of Birth: January 20, 1941  Transition of Care [facility]) CM/SW Contact  Mearl Latin, LCSW Phone Number: 06/17/2023, 9:02 AM  Clinical Narrative:    Patient transferred to ICU and is currently intubated. CSW will update [facility] and continue to follow.    Expected Discharge Plan: Skilled Nursing Facility Barriers to Discharge: Continued Medical Work up  Expected Discharge Plan and Services In-house Referral: Clinical Social Work   Post Acute Care Choice: Skilled Nursing Facility Living arrangements for the past 2 months: Single Family Home                                       Social Determinants of Health (SDOH) Interventions SDOH Screenings   Food Insecurity: Patient Unable To Answer (06/07/2023)  Housing: Patient Unable To Answer (06/07/2023)  Transportation Needs: Patient Unable To Answer (06/07/2023)  Utilities: Patient Unable To Answer (06/07/2023)  Depression (PHQ2-9): Low Risk  (12/03/2019)  Social Connections: Patient Unable To Answer (06/07/2023)  Tobacco Use: Medium Risk (06/13/2023)    Readmission Risk Interventions     No data to display

## 2023-06-17 NOTE — Progress Notes (Signed)
 SLP Cancellation Note  Patient Details Name: Nathan Proctor. MRN: [id number] DOB: 1940/09/22   Cancelled treatment:       Reason Eval/Treat Not Completed: Medical issues which prohibited therapy. Pt intubated 2/15. SLP will f/u as able.    Mahala Menghini., M.A. CCC-SLP Acute Rehabilitation Services Office [phone]  Secure chat preferred  06/17/2023, 12:13 PM

## 2023-06-17 NOTE — Consult Note (Signed)
 WOC Nurse Consult Note: WOC team consulted for Stage 2 to scrotum see that note 2/10 Reason for Consult: reconsulted for worsening scrotal wound  Wound type: 1.  Stage 3 PI scrotum  2.  Full thickness Coccyx r/t moisture and friction  Pressure Injury POA: Yes, has progressed to Stage 3  Measurement: 1.  Scrotum 3 cm x 2 cm 50% pink moist 50% tan slough  2.  Full thickness coccyx 5 cm x 1 cm x 0.1 cm linear pink and moist  Wound bed: as above  Drainage (amount, consistency, odor) minimal tan from scrotum, coccyx serosanguinous  Periwound: moist, scattered areas of partial thickness skin loss scrotum  Dressing procedure/placement/frequency: Cleanse scrotum, buttocks, coccyx with soap and water, dry and apply Medihoney to coccyx and scrotal wounds daily, cover with dry gauze and silicone foam.    Will order Desitin to buttocks 2 times daily and prn soiling.   POC discussed with bedside nurse. WOC team will follow every 7-10 to evaluate scrotal wound.   Thank you,    Priscella Mann MSN, RN-BC, [employer] [phone]

## 2023-06-17 NOTE — Progress Notes (Signed)
 STROKE TEAM PROGRESS NOTE   SUBJECTIVE (INTERVAL HISTORY) RN is at the bedside. Patient still on 3 pressors for BP management.  No more GIB and his creatinine has improved to 4.07.  still on sedation for vent management. Palliative care consulted to help with goals of care discussion.  OBJECTIVE Temp:  [96.2 F (35.7 C)-97.6 F (36.4 C)] 97.5 F (36.4 C) (02/17 0700) Pulse Rate:  [54-94] 60 (02/17 1230) Cardiac Rhythm: Normal sinus rhythm;Sinus bradycardia (02/17 1200) Resp:  [11-24] 22 (02/17 1230) BP: (81-177)/(25-91) 119/67 (02/17 1230) SpO2:  [94 %-100 %] 98 % (02/17 1230) FiO2 (%):  [40 %] 40 % (02/17 0750) Weight:  [132.6 kg] 132.6 kg (02/17 0500)  Recent Labs  Lab 06/17/23 0554 06/17/23 0751 06/17/23 0952 06/17/23 1052 06/17/23 1201  GLUCAP 144* 135* 123* 133* 148*   Recent Labs  Lab 06/15/23 0625 06/15/23 1151 06/15/23 2304 06/16/23 0414 06/16/23 1611 06/17/23 0423  NA 143 141 143 141 142 143  K 5.1 5.0 5.3* 5.3* 4.6 4.6  CL 105  --  107 107 108 109  CO2 22  --  21* 20* 22 20*  GLUCOSE 351*  --  169* 180* 162* 150*  BUN 64*  --  69* 68* 66* 67*  CREATININE 3.41*  --  4.35* 4.61* 4.14* 4.07*  CALCIUM 9.4  --  8.4* 8.4* 8.2* 8.6*  MG  --   --  2.0 2.1  --  2.1  PHOS  --   --   --   --   --  4.8*   Recent Labs  Lab 06/16/23 0414 06/17/23 0423  AST 44* 35  ALT 30 29  ALKPHOS 45 46  BILITOT 1.1 1.1  PROT 5.5* 5.4*  ALBUMIN 2.5* 2.2*    Recent Labs  Lab 06/15/23 1101 06/15/23 1151 06/15/23 1521 06/15/23 1800 06/16/23 0414 06/16/23 1720 06/17/23 0423  WBC 26.5*  --   --  44.5* 41.4* 33.5* 29.9*  HGB 9.1*   < > 8.4* 9.3* 8.8* 8.2* 8.1*  HCT 29.7*   < > 27.1* 30.2* 28.3* 26.5* 25.4*  MCV 102.4*  --   --  101.3* 100.0 99.6 98.4  PLT 233  --   --  270 219 167 157   < > = values in this interval not displayed.   No results for input(s): "CKTOTAL", "CKMB", "CKMBINDEX", "TROPONINI" in the last 168 hours. No results for input(s): "LABPROT", "INR" in  the last 72 hours.  No results for input(s): "COLORURINE", "LABSPEC", "PHURINE", "GLUCOSEU", "HGBUR", "BILIRUBINUR", "KETONESUR", "PROTEINUR", "UROBILINOGEN", "NITRITE", "LEUKOCYTESUR" in the last 72 hours.  Invalid input(s): "APPERANCEUR"      Component Value Date/Time   CHOL 183 06/06/2023 0557   TRIG 97 06/16/2023 0414   HDL 60 06/06/2023 0557   CHOLHDL 3.1 06/06/2023 0557   VLDL 16 06/06/2023 0557   LDLCALC 107 (H) 06/06/2023 0557   LDLCALC 223 (H) 05/31/2021 0000   Lab Results  Component Value Date   HGBA1C 6.1 (H) 06/05/2023      Component Value Date/Time   LABOPIA NONE DETECTED 06/05/2023 1653   COCAINSCRNUR NONE DETECTED 06/05/2023 1653   LABBENZ NONE DETECTED 06/05/2023 1653   AMPHETMU NONE DETECTED 06/05/2023 1653   THCU NONE DETECTED 06/05/2023 1653   LABBARB NONE DETECTED 06/05/2023 1653    No results for input(s): "ETH" in the last 168 hours.   I have personally reviewed the radiological images below and agree with the radiology interpretations.  CT CHEST ABDOMEN PELVIS WO CONTRAST  Result Date: 06/16/2023 CLINICAL DATA:  Septicemia.  Pneumonia. EXAM: CT CHEST, ABDOMEN AND PELVIS WITHOUT CONTRAST TECHNIQUE: Multidetector CT imaging of the chest, abdomen and pelvis was performed following the standard protocol without IV contrast. RADIATION DOSE REDUCTION: This exam was performed according to the departmental dose-optimization program which includes automated exposure control, adjustment of the mA and/or kV according to patient size and/or use of iterative reconstruction technique. COMPARISON:  CT angio chest from 04/04/2016. FINDINGS: CT CHEST FINDINGS Cardiovascular: Heart size is normal. Aortic atherosclerosis and multi vessel coronary artery calcifications. No pericardial effusion. Mediastinum/Nodes: Thyroid gland, trachea and esophagus appear normal. ET tube is in place with tip above the carina. There is a enteric tube with tip in the stomach.No enlarged  mediastinal lymph nodes. Hilar lymph nodes are suboptimally evaluated due to lack of IV contrast. Lungs/Pleura: No pleural effusion or interstitial edema. Bilateral lower lobe airspace consolidation is identified, right greater than left. Imaging findings compatible with multifocal pneumonia. Chronic subsegmental atelectasis this is within the right middle lobe and right lower lobe with asymmetric volume loss and elevation of the right hemidiaphragm. Musculoskeletal: No chest wall mass or suspicious bone lesions identified. CT ABDOMEN PELVIS FINDINGS Hepatobiliary: No focal liver abnormality. Gallbladder appears normal. No signs of bile duct dilatation. Pancreas: Unremarkable. No pancreatic ductal dilatation or surrounding inflammatory changes. Spleen: Normal in size without focal abnormality. Adrenals/Urinary Tract: Normal adrenal glands. Multiple stones identified within the upper pole of the left kidney which measure up to 7 mm. Bilateral exophytic kidney lesions of varying complexity are identified. These are suboptimally assessed on the current exam reflecting lack of IV contrast material. No signs of hydronephrosis. Urinary bladder is decompressed around a Foley catheter. Stomach/Bowel: Enteric tube tip is in the proximal stomach. There is retained enteric contrast material within the gastric fundus. The appendix is visualized and appears normal. There are several dilated loops of small bowel within the right hemiabdomen. The proximal small bowel loops are normal in caliber. Within the right upper quadrant of the abdomen there are multiple dilated loops of small bowel measuring up to 3.5 cm containing air-fluid levels. The bowel loops proximal and distal are normal in caliber. Within the limitations of unenhanced technique there is no significant bowel wall thickening. Distal colonic diverticulosis identified without signs of acute diverticulitis. Retained enteric contrast material identified within the colon  up to the rectum Vascular/Lymphatic: Aortic atherosclerosis without aneurysm. No signs of abdominopelvic adenopathy. Reproductive: Prostate gland is either atrophic or surgically absent. Penile prosthesis identified. Other: No free fluid or fluid collections. Musculoskeletal: No acute or significant osseous findings. IMPRESSION: 1. Bilateral lower lobe airspace consolidation, right greater than left. Imaging findings compatible with multifocal pneumonia. 2. Chronic subsegmental atelectasis within the right middle lobe and right lower lobe with asymmetric volume loss and elevation of the right hemidiaphragm. 3. Assessment of bowel pathology is limited due to lack of IV and enteric contrast material. There are multiple abnormally dilated loops of small bowel within the right hemiabdomen. Findings are compatible with a small-bowel obstruction. As there is normal caliber proximal small bowel and normal caliber distal small bowel findings are concerning for either close loop obstruction or internal hernia. 4. Distal colonic diverticulosis without signs of acute diverticulitis. 5. Nonobstructing left renal calculi. 6. Bilateral exophytic kidney lesions of varying complexity are identified. These are suboptimally assessed on the current exam reflecting lack of IV contrast material. Further evaluation with nonemergent renal ultrasound is advised. 7.  Aortic Atherosclerosis (ICD10-I70.0). Electronically Signed  By: Signa Kell M.D.   On: 06/16/2023 15:51   ECHOCARDIOGRAM LIMITED Result Date: 06/15/2023    ECHOCARDIOGRAM LIMITED REPORT   Patient Name:   Tonny Isensee. Date of Exam: 06/15/2023 Medical Rec #:  [id number]              Height:       70.0 in Accession #:    [id number]             Weight:       263.9 lb Date of Birth:  04-05-1941              BSA:          2.348 m Patient Age:    83 years               BP:           121/53 mmHg Patient Gender: M                      HR:           90 bpm. Exam Location:   Inpatient Procedure: Limited Echo, Color Doppler and Intracardiac Opacification Agent            (Both Spectral and Color Flow Doppler were utilized during            procedure). Indications:    Stroke i63.9, Shock  History:        Patient has prior history of Echocardiogram examinations, most                 recent 06/05/2023. CHF; Risk Factors:Hypertension and                 Dyslipidemia.  Sonographer:    Irving Burton Senior RDCS Referring Phys: [phone] [facility] JANE ELLISON  Sonographer Comments: Technically difficult due to body habitus, scanned supine on artificial repirator IMPRESSIONS  1. Very difficult to determine LVEF to limited visualization and frequent ectopy. . Left ventricular ejection fraction, by estimation, is 40 to 45%. The left ventricle has mildly decreased function. Left ventricular endocardial border not optimally defined to evaluate regional wall motion.  2. Right ventricular systolic function was not well visualized. The right ventricular size is not well visualized.  3. The inferior vena cava is normal in size with <50% respiratory variability, suggesting right atrial pressure of 8 mmHg.  4. Limited echo FINDINGS  Left Ventricle: Very difficult to determine LVEF to limited visualization and frequent ectopy. Left ventricular ejection fraction, by estimation, is 40 to 45%. The left ventricle has mildly decreased function. Left ventricular endocardial border not optimally defined to evaluate regional wall motion. Definity contrast agent was given IV to delineate the left ventricular endocardial borders. Right Ventricle: The right ventricular size is not well visualized. Right vetricular wall thickness was not well visualized. Right ventricular systolic function was not well visualized. Venous: The inferior vena cava is normal in size with less than 50% respiratory variability, suggesting right atrial pressure of 8 mmHg. Additional Comments: Color Doppler performed.  Dina Rich MD Electronically  signed by Dina Rich MD Signature Date/Time: 06/15/2023/4:31:38 PM    Final    DG Abd 1 View Result Date: 06/15/2023 CLINICAL DATA:  Orogastric tube placement. EXAM: ABDOMEN - 1 VIEW COMPARISON:  Radiograph 06/08/2023 FINDINGS: Tip of the enteric tube below the diaphragm in the stomach, the side port is just beyond the gastroesophageal junction. There is gaseous gastric distension. Dilated bowel in the  right abdomen up 4.7 cm, potentially small bowel, but incompletely included in the field of view. IMPRESSION: 1. Tip of the enteric tube below the diaphragm in the stomach, side-port just beyond the gastroesophageal junction. 2. Gaseous gastric distension. Additional dilated bowel in the right abdomen likely small bowel, although incompletely included in the field of view. Electronically Signed   By: Narda Rutherford M.D.   On: 06/15/2023 15:06   DG CHEST PORT 1 VIEW Result Date: 06/15/2023 CLINICAL DATA:  10031 Cough 10031 EXAM: PORTABLE CHEST 1 VIEW COMPARISON:  June 08, 2023 FINDINGS: The cardiomediastinal silhouette is unchanged in contour.Unchanged elevation of the RIGHT hemidiaphragm. No pleural effusion. No pneumothorax. Bibasilar platelike opacities. Atherosclerotic calcifications. Similar background of interstitial prominence. IMPRESSION: Bibasilar platelike opacities, favored to reflect atelectasis. Electronically Signed   By: Meda Klinefelter M.D.   On: 06/15/2023 12:07   DG CHEST PORT 1 VIEW Result Date: 06/15/2023 CLINICAL DATA:  Respiratory failure, intubation and central line placement. EXAM: PORTABLE CHEST 1 VIEW COMPARISON:  Film earlier today at 0734 hours FINDINGS: Interval intubation with the endotracheal tube tip approximately 3.5 cm above the carina. Left jugular central line placement with the line tip in the upper SVC. No pneumothorax. Volume loss of the right lung remains with elevation of the right hemidiaphragm. Improved expansion of the left lung. Potential underlying mild  pulmonary interstitial edema. No significant pleural effusions. IMPRESSION: 1. Interval intubation and left jugular central line placement. No pneumothorax. 2. Improved expansion of the left lung. Persistent volume loss of the right lung with elevation of the right hemidiaphragm. Potential underlying mild pulmonary interstitial edema. Electronically Signed   By: Irish Lack M.D.   On: 06/15/2023 10:42   DG Swallowing Func-Speech Pathology Result Date: 06/12/2023 Table formatting from the original result was not included. Modified Barium Swallow Study Patient Details Name: Maison Kestenbaum. MRN: [id number] Date of Birth: 1940-08-27 Today's Date: 06/12/2023 HPI/PMH: HPI: Pt is a 83 y.o. male who was admitted as a code stroke on 02/05 due to acute onset of right-sided weakness, right facial droop and aphasia. MRI from 02/06 displayed an evolving acute left MCA infarct, most pronounced at the left insula and left frontal operculum. Pt has intentionally lost 300 pounds in the recent past, with pre-loss weight at ~600Ib. PMHx includes hypertension, diabetes, CKD, prior CVA. Clinical Impression: Clinical Impression: Pt presents with a mild-moderate oropharyngeal dysphagia (DIGEST Score: 1) primarily characterized by silent penetration of thin liquid at level of VFs, flash penetration of nectar-thick above VFs, and diffuse pharyngeal residue. Pt's dysphagia severity level dropped from DIGEST score of 3 to 1, compared to previous MBS eval on 02/07.     Pharyngeal residue was present throughout all consistencies. Pt's posture for swallowing was consistently neutral. Pt follows commands for compensatory strategies more readily now with max multimodal cueing. Effective compensatory strategies include multiple swallows, throat clearing, and a straw when used with a verbally cued slow rate. Multiple consecutive sips of thin-liquid presented the most risk for aspiration due to silent penetration of Vfs. Unlike previous  MBS study, pt tolerated solid consistency, displaying prolonged mastication but majority clearance of bolus after 2-3 swallows.     Recommendations include upgrading pt's diet to dysphagia 2 (finely chopped) and thin liquids to optimize safety and efficiency for swallowing. SLP f/u to monitor tolerance of thin-liquids with compensatory strategies is highly recommended. Pt needs to remain upright for 30 minutes after PO intake due to remaining pharyngeal residue to optimize airway safety. Factors  that may increase risk of adverse event in presence of aspiration [name] & Clearance [name] 2021): Factors that may increase risk of adverse event in presence of aspiration [name] & Clearance [name] 2021): Dependence for feeding and/or oral hygiene Recommendations/Plan: Swallowing Evaluation Recommendations Swallowing Evaluation Recommendations Recommendations: PO diet PO Diet Recommendation: Dysphagia 2 (Finely chopped); Thin liquids (Level 0) Liquid Administration via: Straw Medication Administration: Crushed with puree Supervision: Full supervision/cueing for swallowing strategies; Full assist for feeding Swallowing strategies  : Slow rate; Small bites/sips; Multiple dry swallows after each bite/sip; Clear throat intermittently Postural changes: Position pt fully upright for meals; Stay upright 30-60 min after meals Oral care recommendations: Oral care BID (2x/day); Staff/trained caregiver to provide oral care Treatment Plan Treatment Plan Follow-up recommendations: Follow physicians's recommendations for discharge plan and follow up therapies Functional status assessment: Patient has had a recent decline in their functional status and demonstrates the ability to make significant improvements in function in a reasonable and predictable amount of time. Recommendations Recommendations for follow up therapy are one component of a multi-disciplinary discharge planning process, led by the attending physician.  Recommendations may be updated  based on patient status, additional functional criteria and insurance authorization. Assessment: Orofacial Exam: Orofacial Exam Oral Cavity: Oral Hygiene: WFL Oral Cavity - Dentition: Adequate natural dentition Orofacial Anatomy: WFL Anatomy: Anatomy: WFL Boluses Administered: Boluses Administered Boluses Administered: Thin liquids (Level 0); Mildly thick liquids (Level 2, nectar thick); Moderately thick liquids (Level 3, honey thick); Puree; Solid  Oral Impairment Domain: Oral Impairment Domain Lip Closure: Interlabial escape, no progression to anterior lip Tongue control during bolus hold: Cohesive bolus between tongue to palatal seal Bolus preparation/mastication: Slow prolonged chewing/mashing with complete recollection Bolus transport/lingual motion: Brisk tongue motion Oral residue: Residue collection on oral structures Location of oral residue : Tongue Initiation of pharyngeal swallow : Pyriform sinuses  Pharyngeal Impairment Domain: Pharyngeal Impairment Domain Soft palate elevation: No bolus between soft palate (SP)/pharyngeal wall (PW) Laryngeal elevation: Partial superior movement of thyroid cartilage/partial approximation of arytenoids to epiglottic petiole Anterior hyoid excursion: Complete anterior movement Epiglottic movement: Complete inversion Laryngeal vestibule closure: Incomplete, narrow column air/contrast in laryngeal vestibule Pharyngeal stripping wave : Present - complete Pharyngeal contraction (A/P view only): N/A Pharyngoesophageal segment opening: Complete distension and complete duration, no obstruction of flow Tongue base retraction: Trace column of contrast or air between tongue base and PPW Pharyngeal residue: Collection of residue within or on pharyngeal structures Location of pharyngeal residue: Diffuse (>3 areas)  Esophageal Impairment Domain: Esophageal Impairment Domain Esophageal clearance upright position: -- (Not tested) Pill: No data recorded Penetration/Aspiration Scale  Score: Penetration/Aspiration Scale Score 1.  Material does not enter airway: Solid; Puree; Moderately thick liquids (Level 3, honey thick) 2.  Material enters airway, remains ABOVE vocal cords then ejected out: Mildly thick liquids (Level 2, nectar thick) 5.  Material enters airway, CONTACTS cords and not ejected out: Thin liquids (Level 0) Compensatory Strategies: Compensatory Strategies Compensatory strategies: Yes Straw: Effective Effective Straw: Mildly thick liquid (Level 2, nectar thick) (Effective with slow rate applied.) Multiple swallows: Effective Effective Multiple Swallows: Thin liquid (Level 0); Mildly thick liquid (Level 2, nectar thick); Moderately thick liquid (Level 3, honey thick); Puree; Solid   General Information: Caregiver present: No  Diet Prior to this Study: Dysphagia 1 (pureed); Moderately thick liquids (Level 3, honey thick)   Temperature : Normal   Respiratory Status: WFL   Supplemental O2: None (Room air)   History of Recent Intubation: Yes  Behavior/Cognition: Alert; Cooperative; Pleasant mood;  Requires cueing Self-Feeding Abilities: Needs assist with self-feeding Baseline vocal quality/speech: Normal Volitional Cough: Unable to elicit Volitional Swallow: Able to elicit Exam Limitations: No limitations Goal Planning: Prognosis for improved oropharyngeal function: Good Barriers to Reach Goals: Language deficits No data recorded No data recorded No data recorded Pain: Pain Assessment Pain Assessment: No/denies pain Pain Score: 0 End of Session: Start Time:SLP Start Time (ACUTE ONLY): 1310 Stop Time: SLP Stop Time (ACUTE ONLY): 1335 Time Calculation:SLP Time Calculation (min) (ACUTE ONLY): 25 min Charges: SLP Evaluations $ SLP Speech Visit: 1 Visit SLP Evaluations $MBS Swallow: 1 Procedure $Swallowing Treatment: 1 Procedure SLP visit diagnosis: SLP Visit Diagnosis: Dysphagia, oropharyngeal phase (R13.12) Past Medical History: Past Medical History: Diagnosis Date  Anemia of chronic disease    Ankle fracture 02/15/2016  Cancer (HCC)   Prostate  Chronic constipation   Chronic kidney disease   stage III  Diabetes mellitus without complication (HCC)   type II   Diabetic peripheral neuropathy (HCC)   Failure to thrive (0-17)   Fracture of left lower leg   Gout   Hyperlipidemia   Hypertension   Morbid obesity (HCC)   Unstable gait  Past Surgical History: Past Surgical History: Procedure Laterality Date  IR CT HEAD LTD  06/05/2023  IR INTRAVSC STENT CERV CAROTID W/O EMB-PROT MOD SED INC ANGIO  06/05/2023  IR PERCUTANEOUS ART THROMBECTOMY/INFUSION INTRACRANIAL INC DIAG ANGIO  06/05/2023  IR US GUIDE VASC ACCESS RIGHT  06/05/2023  ORIF ANKLE FRACTURE Left 02/29/2016  Procedure: OPEN REDUCTION INTERNAL FIXATION (ORIF) ANKLE FRACTURE;  Surgeon: Yolonda Kida, MD;  Location: [facility];  Service: Orthopedics;  Laterality: Left;  PROSTATE SURGERY    RADIOLOGY WITH ANESTHESIA N/A 06/05/2023  Procedure: IR WITH ANESTHESIA;  Surgeon: Radiologist, Medication, MD;  Location: [facility];  Service: Radiology;  Laterality: N/A; Claudine Mouton 06/12/2023, 3:16 PM  DG Abd 1 View Result Date: 06/09/2023 CLINICAL DATA:  [id number]. Encounter for feeding tube placement. EXAM: ABDOMEN - 1 VIEW COMPARISON:  Abdomen film 06/05/2023. FINDINGS: Dobbhoff feeding tube is well placed with the radiopaque tip in the distal stomach. The visualized bowel pattern is nonobstructive. There is barium newly seen in the flexures and transverse colon. There is no supine evidence of free air. There is atelectasis in the lung bases. Cardiomegaly. IMPRESSION: 1. Dobbhoff feeding tube well placed with the radiopaque tip in the distal stomach. 2. Nonobstructive bowel gas pattern. 3. Cardiomegaly. Electronically Signed   By: Almira Bar M.D.   On: 06/09/2023 03:41   DG CHEST PORT 1 VIEW Result Date: 06/08/2023 CLINICAL DATA:  CHF EXAM: PORTABLE CHEST 1 VIEW COMPARISON:  01/06/2023 FINDINGS: Feeding tube in place. There is cardiomegaly with vascular  congestion. Chronic elevation of the right hemidiaphragm with right base atelectasis. Interstitial prominence throughout the lungs, right greater than left could reflect interstitial edema. Possible small effusions. No acute bony abnormality. IMPRESSION: Cardiomegaly with vascular congestion and probable mild interstitial edema. Question small bilateral effusions. Chronic elevation of the right hemidiaphragm with right base atelectasis. Electronically Signed   By: Charlett Nose M.D.   On: 06/08/2023 18:20   DG Swallowing Func-Speech Pathology Result Date: 06/07/2023 Table formatting from the original result was not included. Modified Barium Swallow Study Study completed and documented by [name], SLP Student Supervised and reviewed by Harlon Ditty, MA CCC-SLP [facility] Secure Chat Preferred Office [phone] Patient Details Name: Omid Deardorff. MRN: [id number] Date of Birth: 1941/03/11 Today's Date: 06/07/2023 HPI/PMH: HPI: Pt is a 83  y.o. male who was admitted as a code stroke on 02/05 due to acute onset of right-sided weakness, right facial droop and aphasia. MRI from 02/06 displayed an evolving acute left MCA infarct, most pronounced at the left insula and left frontal operculum. Pt has intentionally lost 300 pounds in the recent past, with pre-loss weight at ~600Ib. PMHx includes hypertension, diabetes, CKD, prior CVA. Clinical Impression: Clinical Impression: Pt presented with a moderate-severe oropharyngeal dysphagia (DIGEST Score: 3) primarily characterized by silent aspiration of thin liquid, silent penetration of nectar-thick liquid, diffuse pharyngeal residue collection during thin/nectar-thick consistencies, and posterior-escape of bolus across multiple consistencies. Pt positioning was a potential limitation for examination due to consistent posterior head position. More anterior head positioning during nectar-thick liquid displayed less pharyngeal residue, increased  hyo-laryngeal excursion, and overall better airway safety. This positioning was not present throughout due to pt's receptive language deficits to understand cues. Alternating between honey-thick and puree consistencies provided more oropharyngeal bolus clearance and no signs of aspiration. Recommend a dysphagia 1 (puree) and honey-thick liquid diet to optimize pt's airway safety and potential for meeting nutritional needs. Slow rate, small bites/sips, natural head posturing, and multiple swallows are compensatory strategies that displayed best efficiency with pt. F/u with SLP for family education, compensatory strategy training, and upgraded diet trials is recommended. Factors that may increase risk of adverse event in presence of aspiration [name] & Clearance [name] 2021): Factors that may increase risk of adverse event in presence of aspiration [name] & Clearance [name] 2021): Weak cough Recommendations/Plan: Swallowing Evaluation Recommendations Swallowing Evaluation Recommendations Recommendations: PO diet PO Diet Recommendation: Dysphagia 1 (Pureed); Moderately thick liquids (Level 3, honey thick) Liquid Administration via: Spoon Medication Administration: Crushed with puree Supervision: Full supervision/cueing for swallowing strategies; Full assist for feeding Swallowing strategies  : Slow rate; Small bites/sips; Multiple dry swallows after each bite/sip (Pt needs to be in a neutral head position (no posterior head tilt).) Postural changes: Position pt fully upright for meals; Stay upright 30-60 min after meals Oral care recommendations: Oral care BID (2x/day); Staff/trained caregiver to provide oral care Caregiver Recommendations: Have oral suction available Treatment Plan Treatment Plan Treatment recommendations: Therapy as outlined in treatment plan below Follow-up recommendations: Follow physicians's recommendations for discharge plan and follow up therapies Functional status assessment: Patient has had a recent decline  in their functional status and demonstrates the ability to make significant improvements in function in a reasonable and predictable amount of time. Treatment frequency: Min 2x/week Treatment duration: 2 weeks Interventions: Aspiration precaution training; Compensatory techniques; Patient/family education; Diet toleration management by SLP; Trials of upgraded texture/liquids Recommendations Recommendations for follow up therapy are one component of a multi-disciplinary discharge planning process, led by the attending physician.  Recommendations may be updated based on patient status, additional functional criteria and insurance authorization. Assessment: Orofacial Exam: Orofacial Exam Oral Cavity - Dentition: Adequate natural dentition Orofacial Anatomy: WFL Anatomy: Anatomy: WFL Boluses Administered: Boluses Administered Boluses Administered: Thin liquids (Level 0); Mildly thick liquids (Level 2, nectar thick); Moderately thick liquids (Level 3, honey thick); Puree; Solid  Oral Impairment Domain: Oral Impairment Domain Lip Closure: Escape progressing to mid-chin Tongue control during bolus hold: Posterior escape of greater than half of bolus Bolus preparation/mastication: -- (Pt did not chew solid and SLP had to remove it from mouth.) Bolus transport/lingual motion: Repetitive/disorganized tongue motion Oral residue: Residue collection on oral structures Location of oral residue : Tongue; Palate Initiation of pharyngeal swallow : Pyriform sinuses  Pharyngeal Impairment Domain: Pharyngeal Impairment Domain Soft palate  elevation: No bolus between soft palate (SP)/pharyngeal wall (PW) Laryngeal elevation: Partial superior movement of thyroid cartilage/partial approximation of arytenoids to epiglottic petiole Anterior hyoid excursion: Partial anterior movement Epiglottic movement: Partial inversion Laryngeal vestibule closure: Incomplete, narrow column air/contrast in laryngeal vestibule Pharyngeal stripping wave :  Present - complete Pharyngeal contraction (A/P view only): N/A Pharyngoesophageal segment opening: Complete distension and complete duration, no obstruction of flow Tongue base retraction: Narrow column of contrast or air between tongue base and PPW Pharyngeal residue: Collection of residue within or on pharyngeal structures Location of pharyngeal residue: Diffuse (>3 areas)  Esophageal Impairment Domain: Esophageal Impairment Domain Esophageal clearance upright position: -- (Not tested.) Pill: No data recorded Penetration/Aspiration Scale Score: Penetration/Aspiration Scale Score 1.  Material does not enter airway: Puree; Moderately thick liquids (Level 3, honey thick) 3.  Material enters airway, remains ABOVE vocal cords and not ejected out: Mildly thick liquids (Level 2, nectar thick) 8.  Material enters airway, passes BELOW cords without attempt by patient to eject out (silent aspiration) : Thin liquids (Level 0) Compensatory Strategies: Compensatory Strategies Compensatory strategies: Yes Straw: Ineffective Multiple swallows: Effective (Required max cueing due to pt's language.) Effective Multiple Swallows: Puree; Moderately thick liquid (Level 3, honey thick); Mildly thick liquid (Level 2, nectar thick)   General Information: Caregiver present: No  Diet Prior to this Study: NPO   Temperature : Normal   Respiratory Status: WFL   Supplemental O2: None (Room air)   No data recorded Behavior/Cognition: Alert; Cooperative; Pleasant mood; Requires cueing Self-Feeding Abilities: Needs assist with self-feeding Baseline vocal quality/speech: Dysphonic Volitional Cough: Unable to elicit Volitional Swallow: Able to elicit Exam Limitations: Poor positioning Goal Planning: Prognosis for improved oropharyngeal function: Good Barriers to Reach Goals: Language deficits No data recorded No data recorded Consulted and agree with results and recommendations: Pt unable/family or caregiver not available Pain: Pain Assessment Pain  Assessment: No/denies pain Faces Pain Scale: 0 Facial Expression: 0 Body Movements: 0 Muscle Tension: 0 Compliance with ventilator (intubated pts.): N/A Vocalization (extubated pts.): 0 CPOT Total: 0 End of Session: Start Time:SLP Start Time (ACUTE ONLY): 0903 Stop Time: SLP Stop Time (ACUTE ONLY): 0930 Time Calculation:SLP Time Calculation (min) (ACUTE ONLY): 27 min Charges: SLP Evaluations $ SLP Speech Visit: 1 Visit SLP Evaluations $BSS Swallow: 1 Procedure $MBS Swallow: 1 Procedure $ SLP EVAL LANGUAGE/SOUND PRODUCTION: 1 Procedure $Swallowing Treatment: 1 Procedure SLP visit diagnosis: SLP Visit Diagnosis: Dysphagia, oropharyngeal phase (R13.12) Past Medical History: Past Medical History: Diagnosis Date  Anemia of chronic disease   Ankle fracture 02/15/2016  Cancer (HCC)   Prostate  Chronic constipation   Chronic kidney disease   stage III  Diabetes mellitus without complication (HCC)   type II   Diabetic peripheral neuropathy (HCC)   Failure to thrive (0-17)   Fracture of left lower leg   Gout   Hyperlipidemia   Hypertension   Morbid obesity (HCC)   Unstable gait  Past Surgical History: Past Surgical History: Procedure Laterality Date  IR CT HEAD LTD  06/05/2023  IR INTRAVSC STENT CERV CAROTID W/O EMB-PROT MOD SED INC ANGIO  06/05/2023  IR PERCUTANEOUS ART THROMBECTOMY/INFUSION INTRACRANIAL INC DIAG ANGIO  06/05/2023  IR US GUIDE VASC ACCESS RIGHT  06/05/2023  ORIF ANKLE FRACTURE Left 02/29/2016  Procedure: OPEN REDUCTION INTERNAL FIXATION (ORIF) ANKLE FRACTURE;  Surgeon: Yolonda Kida, MD;  Location: [facility];  Service: Orthopedics;  Laterality: Left;  PROSTATE SURGERY    RADIOLOGY WITH ANESTHESIA N/A 06/05/2023  Procedure: IR WITH ANESTHESIA;  Surgeon: Radiologist, Medication, MD;  Location: [facility];  Service: Radiology;  Laterality: N/A; DeBlois, Riley Nearing 06/07/2023, 12:15 PM  CT HEAD WO CONTRAST ( ) Result Date: 06/07/2023 CLINICAL DATA:  83 year old male status post code stroke presentation, left MCA M2  occlusion, with left MCA infarct with confluent petechial hemorrhage. EXAM: CT HEAD WITHOUT CONTRAST TECHNIQUE: Contiguous axial images were obtained from the base of the skull through the vertex without intravenous contrast. RADIATION DOSE REDUCTION: This exam was performed according to the departmental dose-optimization program which includes automated exposure control, adjustment of the mA and/or kV according to patient size and/or use of iterative reconstruction technique. COMPARISON:  Brain MRI yesterday.  Head CT 06/05/2023. FINDINGS: Brain: Confluent mixed density infarct in the left MCA middle division, epicenter at the insula and operculum. Confluent petechial hemorrhage on series 3, image 16 appears stable from the MRI. Superimposed trace subarachnoid blood is difficult to exclude by CT (series 3, image 13), but was not apparent on MRI. Stable mild mass effect on the left lateral ventricle with only trace rightward midline shift (series 3, image 16). No ventriculomegaly. Elsewhere gray-white differentiation is stable from the presentation CT. Basilar cisterns remain patent. Vascular: Resolved hyperdense left MCA since presentation. Calcified atherosclerosis at the skull base. Skull: Stable, intact. Sinuses/Orbits: Visualized paranasal sinuses and mastoids are stable and well aerated. Other: Left nasoenteric tube now in place. Stable orbit and scalp soft tissues. IMPRESSION: 1. Stable by CT confluent Left MCA middle division infarct with petechial hemorrhage (Heidelberg classification 1b: HI2, confluent petechiae, no mass effect). Questionable trace superimposed SAH, but was not apparent on MRI. 2. Stable minimal intracranial mass effect. 3. No new intracranial abnormality. Electronically Signed   By: Odessa Fleming M.D.   On: 06/07/2023 05:37   MR BRAIN WO CONTRAST Result Date: 06/06/2023 CLINICAL DATA:  Follow-up examination for stroke. EXAM: MRI HEAD WITHOUT CONTRAST TECHNIQUE: Multiplanar, multiecho pulse  sequences of the brain and surrounding structures were obtained without intravenous contrast. COMPARISON:  Comparison made to multiple previous exams from 06/05/2023. FINDINGS: Brain: Examination degraded by motion artifact. Cerebral volume within normal limits for age. Patchy T2/FLAIR hyperintensity involving the periventricular deep white matter both cerebral hemispheres, consistent with chronic small vessel ischemic disease, mild in nature. Small remote infarct noted within the right cerebellum. Confluent restricted diffusion involving the left insula and left frontal operculum, consistent with evolving acute left MCA distribution infarct. Area of infarction measures up to approximately 6 cm in AP diameter. Prominent associated susceptibility artifact, consistent with petechial hemorrhage (series 7, image 61) ( Heidelberg classification 1b: HI2, confluent petechiae, no mass effect. No frank or organized hematoma evident by MRI. No significant regional mass effect. Few additional small foci of infarction noted within the left parieto-occipital region posteriorly (series 3, images 40, 29). Apparent diffusion signal along the right frontal parafalcine region felt to be consistent with artifact related to dural calcification. Note made of a few additional chronic micro hemorrhages within the left cerebellum and right thalamus. No mass lesion or midline shift. Ventricles normal size without hydrocephalus. No extra-axial fluid collection. Pituitary gland suprasellar region within normal limits. Vascular: Major intracranial vascular flow voids are maintained. Skull and upper cervical spine: Craniocervical junction within normal limits. Bone marrow signal intensity overall within normal limits. No scalp soft tissue abnormality. Sinuses/Orbits: Prior bilateral ocular lens replacement. Paranasal sinuses are clear. No mastoid effusion. Other: None. IMPRESSION: 1. Evolving acute left MCA distribution infarct, most pronounced  at the left insula and left frontal operculum. Prominent  associated petechial hemorrhage without frank intraparenchymal hematoma (Heidelberg classification 1b: HI2, confluent petechiae, no mass effect). 2. Few additional small foci of acute infarction within the left parieto-occipital region posteriorly. 3. Underlying mild chronic microvascular ischemic disease with small remote right cerebellar infarct. Electronically Signed   By: Rise Mu M.D.   On: 06/06/2023 03:12   ECHOCARDIOGRAM COMPLETE Result Date: 06/05/2023    ECHOCARDIOGRAM REPORT   Patient Name:   Kaydin Karbowski. Date of Exam: 06/05/2023 Medical Rec #:  [id number]              Height:       70.0 in Accession #:    [id number]             Weight:       292.3 lb Date of Birth:  1940-05-18              BSA:          2.453 m Patient Age:    82 years               BP:           129/72 mmHg Patient Gender: M                      HR:           89 bpm. Exam Location:  Inpatient Procedure: 2D Echo, Color Doppler, Cardiac Doppler and Intracardiac            Opacification Agent Indications:    Stroke I63.9  History:        Patient has prior history of Echocardiogram examinations, most                 recent 10/06/2019. Risk Factors:Diabetes, Hypertension and                 Dyslipidemia.  Sonographer:    Harriette Bouillon RDCS Referring Phys: Lynnae January IMPRESSIONS  1. Left ventricular ejection fraction, by estimation, is 30%. The left ventricle has moderately decreased function. The left ventricle demonstrates global hypokinesis. There is mild left ventricular hypertrophy.  2. Right ventricular systolic function is normal. The right ventricular size is normal.  3. Trivial mitral valve regurgitation.  4. The aortic valve is tricuspid. Aortic valve regurgitation is not visualized.  5. The inferior vena cava is normal in size with greater than 50% respiratory variability, suggesting right atrial pressure of 3 mmHg. Comparison(s): The left ventricular  function is worsened. FINDINGS  Left Ventricle: Left ventricular ejection fraction, by estimation, is 30%. The left ventricle has moderately decreased function. The left ventricle demonstrates global hypokinesis. Definity contrast agent was given IV to delineate the left ventricular endocardial borders. The left ventricular internal cavity size was normal in size. There is mild left ventricular hypertrophy. Right Ventricle: The right ventricular size is normal. Right vetricular wall thickness was not assessed. Right ventricular systolic function is normal. Left Atrium: Left atrial size was normal in size. Right Atrium: Right atrial size was normal in size. Pericardium: There is no evidence of pericardial effusion. Mitral Valve: There is mild thickening of the mitral valve leaflet(s). Mild to moderate mitral annular calcification. Trivial mitral valve regurgitation. Tricuspid Valve: The tricuspid valve is normal in structure. Tricuspid valve regurgitation is trivial. Aortic Valve: The aortic valve is tricuspid. Aortic valve regurgitation is not visualized. Pulmonic Valve: The pulmonic valve was normal in structure. Pulmonic valve regurgitation is not visualized. Aorta: The aortic  root and ascending aorta are structurally normal, with no evidence of dilitation. Venous: The inferior vena cava is normal in size with greater than 50% respiratory variability, suggesting right atrial pressure of 3 mmHg. IAS/Shunts: The interatrial septum was not assessed.  LEFT VENTRICLE PLAX 2D LVIDd:         5.00 cm   Diastology LVIDs:         4.40 cm   LV e' lateral: 5.33 cm/s LV PW:         1.20 cm LV IVS:        1.10 cm LVOT diam:     2.30 cm LV SV:         55 LV SV Index:   23 LVOT Area:     4.15 cm  RIGHT VENTRICLE RV S prime:     14.40 cm/s LEFT ATRIUM         Index LA diam:    4.60 cm 1.88 cm/m  AORTIC VALVE LVOT Vmax:   65.50 cm/s LVOT Vmean:  45.200 cm/s LVOT VTI:    0.133 m  AORTA Ao Root diam: 3.00 cm Ao Asc diam:  3.50 cm   SHUNTS Systemic VTI:  0.13 m Systemic Diam: 2.30 cm Dietrich Pates MD Electronically signed by Dietrich Pates MD Signature Date/Time: 06/05/2023/9:33:38 PM    Final    DG Abd Portable 1V Result Date: 06/05/2023 CLINICAL DATA:  Feeding tube placement. EXAM: PORTABLE ABDOMEN - 1 VIEW COMPARISON:  None Available. FINDINGS: Distal tip of feeding tube is seen in expected position of distal stomach. IMPRESSION: Distal tip of feeding tube seen in expected position of distal stomach. Electronically Signed   By: Lupita Raider M.D.   On: 06/05/2023 15:49   CT HEAD WO CONTRAST Result Date: 06/05/2023 CLINICAL DATA:  Stroke, follow-up. Status post intracranial mechanical thrombectomy and stenting of the left ICA bifurcation. EXAM: CT HEAD WITHOUT CONTRAST TECHNIQUE: Contiguous axial images were obtained from the base of the skull through the vertex without intravenous contrast. RADIATION DOSE REDUCTION: This exam was performed according to the departmental dose-optimization program which includes automated exposure control, adjustment of the mA and/or kV according to patient size and/or use of iterative reconstruction technique. COMPARISON:  CT head without contrast and CT angio head and neck 06/05/2023. By plain CT 06/05/2023. FINDINGS: Brain: The study is mildly degraded by patient motion. The left insular and left opercular infarct is somewhat obscured by patient motion. The cortex is slightly hyperdense, potentially reflecting reperfusion. No hemorrhage is present. Basal ganglia are intact. Subcortical white matter hypoattenuation in the high left frontal lobe is new. Mild right-sided white matter disease is stable. Basal ganglia are intact. A remote lacunar infarct is again noted in the right cerebellum. The brainstem and cerebellum are otherwise within normal limits. Vascular: Atherosclerotic calcifications are present within the cavernous internal carotid arteries and at the normal origin of both vertebral arteries. No  hyperdense vessel is present. Skull: Calvarium is intact. No focal lytic or blastic lesions are present. No significant extracranial soft tissue lesion is present. Sinuses/Orbits: The paranasal sinuses and mastoid air cells are clear. Bilateral lens replacements are noted. Globes and orbits are otherwise unremarkable. IMPRESSION: 1. The left insular and left opercular infarct is somewhat obscured by patient motion. 2. The cortex is slightly hyperdense, potentially reflecting reperfusion. 3. No hemorrhage. 4. Subcortical white matter hypoattenuation in the high left frontal lobe is new. This may reflect ischemic changes or edema related to the reperfusion. 5. Remote lacunar  infarct of the right cerebellum. 6. Stable mild right-sided white matter disease. This likely reflects the sequela of chronic microvascular ischemia. Electronically Signed   By: Marin Roberts M.D.   On: 06/05/2023 14:38   IR PERCUTANEOUS ART THROMBECTOMY/INFUSION INTRACRANIAL INC DIAG ANGIO Result Date: 06/05/2023 INDICATION: 83 year old male presenting with right-sided weakness and aphasia; NIHSS 28. His last known well was 10 p.m. on 06/04/2023. His past medical history significant for prior stroke, hypertension, diabetes and chronic kidney disease; baseline modified Rankin scale 0. Head CT showed hypodensity within the left insula, basal ganglia and left frontal operculum (ASPECTS 7). No IV thrombolytic given as patient was outside the window. CT angiogram of the head and neck showed an occlusion of a left M2/MCA anterior division branch. CT perfusion showed a 41 mL core infarct with a 45 mL ischemic penumbra. She was transferred to our service for mechanical thrombectomy. EXAM: ULTRASOUND-GUIDED VASCULAR ACCESS DIAGNOSTIC CEREBRAL ANGIOGRAM MECHANICAL THROMBECTOMY FLAT PANEL HEAD CT LEFT CAROTID STENTING AND ANGIOPLASTY WITHOUT CEREBRAL PROTECTION DEVICE COMPARISON:  CT/CT angiogram of the head and neck June 05, 2023. MEDICATIONS:  No antibiotics administered. ANESTHESIA/SEDATION: The procedure was performed under general anesthesia. CONTRAST:  80 mL of Omnipaque 300 milligram/mL FLUOROSCOPY: Radiation Exposure Index (as provided by the fluoroscopic device): 1315 mGy Kerma COMPLICATIONS: None immediate. TECHNIQUE: Informed written consent was obtained from the patient's wife after a thorough discussion of the procedural risks, benefits and alternatives. All questions were addressed. Maximal Sterile Barrier Technique was utilized including caps, mask, sterile gowns, sterile gloves, sterile drape, hand hygiene and skin antiseptic. A timeout was performed prior to the initiation of the procedure. The right groin was prepped and draped in the usual sterile fashion. Using a micropuncture kit and the modified Seldinger technique, access was gained to the right common femoral artery and an 8 French sheath was placed. Real-time ultrasound guidance was utilized for vascular access including the acquisition of a permanent ultrasound image documenting patency of the accessed vessel. Under fluoroscopy, an 8 [place] Walrus balloon guide catheter was navigated over a 6 [place] VTK catheter and a 0.035" Terumo Glidewire into the aortic arch. The catheter was placed into the left common carotid artery and then advanced into the left internal carotid artery. The diagnostic catheter was removed. Frontal and lateral angiograms of the head were obtained. FINDINGS: 1. Ultrasound showed heavily calcified right common femoral artery is maintained patency and caliber. 2. Proximal occlusion of a left M2/MCA anterior division branch. 3. Atherosclerotic changes of the intracranial left ICA with mild stenosis at the distal cavernous segment. 4. 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 PROCEDURE: Using biplane roadmap guidance, a Red 62 aspiration catheter was  navigated over Colossus 35 microguidewire into the cavernous segment of the left ICA. The aspiration catheter was then advanced to the level of occlusion and connected to an aspiration pump. Continuous aspiration was performed for 2 minutes. The guide catheter was connected to a VacLok syringe and the guiding catheter balloon was inflated. The aspiration catheter was subsequently removed under constant aspiration. The guide catheter was aspirated for debris. Left internal carotid artery angiograms with frontal and lateral views of the head showed complete recanalization of the left MCA vascular tree. The guide catheter was retracted into the neck. Frontal and lateral angiograms of the neck were obtained. Improvement of the degree of stenosis in the carotid bulb compared to prior CT angiogram. There is  prominent luminal irregularity at the carotid bulb residual moderate stenosis. Increased tortuosity of the proximal/mid cervical left ICA with kinking. Left internal carotid artery angiograms with left anterior oblique views of the neck showed evidence of filling defects at the level of the carotid bulb. Flat panel CT of the head was obtained and post processed in a separate workstation with concurrent attending physician supervision. Selected images were sent to PACS. No evidence of hemorrhagic complication. There is mild contrast staining of the left insular and frontal cortex. Repeat left internal carotid artery angiograms with frontal and lateral views of the head showed Amy seen left M3/MCA branch to the left parietal region. Left common carotid artery angiograms with frontal and lateral views of the neck showed vertebra Gretchen of stenosis at the left ICA bulb with more prominent filling defect. At this point, patient was loaded on cangrelor followed by continuous drip. Using biplane roadmap guidance, a 4-7 mm Emboshield NAV6 cerebral protection device was advanced into the cervical left ICA. However, multiple  attempts to advance a cerebral protection device through the left ICA kinking proved unsuccessful. The cerebral protection device was subsequently removed. Using biplane roadmap guidance, a 10-8 x 40 mm XACT carotid stent was navigated and deployed from the distal left common carotid artery to the proximal left internal carotid artery, proximal to the vessel kinking. Suboptimal stent expansion was noted. Then, a 6 x 30 mm Viatrac balloon was navigated into the recently deployed stent. Angioplasty was performed under fluoroscopy. Left internal carotid artery angiograms with frontal and lateral views of the neck showed adequate stent positioning and expansion with brisk anterograde flow. Left internal carotid artery angiograms with frontal and lateral views of the head showed improvement of anterograde flow in the left MCA vascular tree with brisk anterograde flow. Delayed left common carotid artery angiograms with frontal and lateral views of the neck showed no evidence of clot formation within the stent. The catheter was subsequently Riddle. Right common femoral artery angiogram was obtained in right anterior oblique view. The puncture is at the level of the common femoral artery. The artery has normal caliber, adequate for closure device. The sheath was exchanged over the wire for an 8 [place] Angio-Seal which was utilized for access closure. Immediate hemostasis was achieved. IMPRESSION: 1. Successful mechanical thrombectomy for treatment of a proximal left M2/MCA anterior division branch occlusion achieving complete recanalization (TICI 3). 2. 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 PLAN: Continue cangrelor infusion until patient is transitioned to oral dual antiplatelet therapy. Electronically Signed   By: Baldemar Lenis M.D.   On: 06/05/2023 13:16    IR US Guide Vasc Access Right Result Date: 06/05/2023 INDICATION: 83 year old male presenting with right-sided weakness and aphasia; NIHSS 28. His last known well was 10 p.m. on 06/04/2023. His past medical history significant for prior stroke, hypertension, diabetes and chronic kidney disease; baseline modified Rankin scale 0. Head CT showed hypodensity within the left insula, basal ganglia and left frontal operculum (ASPECTS 7). No IV thrombolytic given as patient was outside the window. CT angiogram of the head and neck showed an occlusion of a left M2/MCA anterior division branch. CT perfusion showed a 41 mL core infarct with a 45 mL ischemic penumbra. She was transferred to our service for mechanical thrombectomy. EXAM: ULTRASOUND-GUIDED VASCULAR ACCESS DIAGNOSTIC CEREBRAL ANGIOGRAM MECHANICAL THROMBECTOMY FLAT PANEL HEAD CT LEFT CAROTID STENTING AND ANGIOPLASTY  WITHOUT CEREBRAL PROTECTION DEVICE COMPARISON:  CT/CT angiogram of the head and neck June 05, 2023. MEDICATIONS: No antibiotics administered. ANESTHESIA/SEDATION: The procedure was performed under general anesthesia. CONTRAST:  80 mL of Omnipaque 300 milligram/mL FLUOROSCOPY: Radiation Exposure Index (as provided by the fluoroscopic device): 1315 mGy Kerma COMPLICATIONS: None immediate. TECHNIQUE: Informed written consent was obtained from the patient's wife after a thorough discussion of the procedural risks, benefits and alternatives. All questions were addressed. Maximal Sterile Barrier Technique was utilized including caps, mask, sterile gowns, sterile gloves, sterile drape, hand hygiene and skin antiseptic. A timeout was performed prior to the initiation of the procedure. The right groin was prepped and draped in the usual sterile fashion. Using a micropuncture kit and the modified Seldinger technique, access was gained to the right common femoral artery and an 8 French sheath was placed. Real-time ultrasound guidance was utilized for  vascular access including the acquisition of a permanent ultrasound image documenting patency of the accessed vessel. Under fluoroscopy, an 8 [place] Walrus balloon guide catheter was navigated over a 6 [place] VTK catheter and a 0.035" Terumo Glidewire into the aortic arch. The catheter was placed into the left common carotid artery and then advanced into the left internal carotid artery. The diagnostic catheter was removed. Frontal and lateral angiograms of the head were obtained. FINDINGS: 1. Ultrasound showed heavily calcified right common femoral artery is maintained patency and caliber. 2. Proximal occlusion of a left M2/MCA anterior division branch. 3. Atherosclerotic changes of the intracranial left ICA with mild stenosis at the distal cavernous segment. 4. 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 PROCEDURE: Using biplane roadmap guidance, a Red 62 aspiration catheter was navigated over Colossus 35 microguidewire into the cavernous segment of the left ICA. The aspiration catheter was then advanced to the level of occlusion and connected to an aspiration pump. Continuous aspiration was performed for 2 minutes. The guide catheter was connected to a VacLok syringe and the guiding catheter balloon was inflated. The aspiration catheter was subsequently removed under constant aspiration. The guide catheter was aspirated for debris. Left internal carotid artery angiograms with frontal and lateral views of the head showed complete recanalization of the left MCA vascular tree. The guide catheter was retracted into the neck. Frontal and lateral angiograms of the neck were obtained. Improvement of the degree of stenosis in the carotid bulb compared to prior CT angiogram. There is prominent luminal irregularity at the carotid bulb residual moderate stenosis. Increased tortuosity of the proximal/mid cervical left ICA  with kinking. Left internal carotid artery angiograms with left anterior oblique views of the neck showed evidence of filling defects at the level of the carotid bulb. Flat panel CT of the head was obtained and post processed in a separate workstation with concurrent attending physician supervision. Selected images were sent to PACS. No evidence of hemorrhagic complication. There is mild contrast staining of the left insular and frontal cortex. Repeat left internal carotid artery angiograms with frontal and lateral views of the head showed Amy seen left M3/MCA branch to the left parietal region. Left common carotid artery angiograms with frontal and lateral views of the neck showed vertebra Gretchen of stenosis at the left ICA bulb with more prominent filling defect. At this point, patient was loaded on cangrelor followed by continuous drip. Using biplane roadmap guidance, a 4-7 mm Emboshield NAV6 cerebral protection device was advanced into the cervical left  ICA. However, multiple attempts to advance a cerebral protection device through the left ICA kinking proved unsuccessful. The cerebral protection device was subsequently removed. Using biplane roadmap guidance, a 10-8 x 40 mm XACT carotid stent was navigated and deployed from the distal left common carotid artery to the proximal left internal carotid artery, proximal to the vessel kinking. Suboptimal stent expansion was noted. Then, a 6 x 30 mm Viatrac balloon was navigated into the recently deployed stent. Angioplasty was performed under fluoroscopy. Left internal carotid artery angiograms with frontal and lateral views of the neck showed adequate stent positioning and expansion with brisk anterograde flow. Left internal carotid artery angiograms with frontal and lateral views of the head showed improvement of anterograde flow in the left MCA vascular tree with brisk anterograde flow. Delayed left common carotid artery angiograms with frontal and lateral views  of the neck showed no evidence of clot formation within the stent. The catheter was subsequently Riddle. Right common femoral artery angiogram was obtained in right anterior oblique view. The puncture is at the level of the common femoral artery. The artery has normal caliber, adequate for closure device. The sheath was exchanged over the wire for an 8 [place] Angio-Seal which was utilized for access closure. Immediate hemostasis was achieved. IMPRESSION: 1. Successful mechanical thrombectomy for treatment of a proximal left M2/MCA anterior division branch occlusion achieving complete recanalization (TICI 3). 2. 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 PLAN: Continue cangrelor infusion until patient is transitioned to oral dual antiplatelet therapy. Electronically Signed   By: Baldemar Lenis M.D.   On: 06/05/2023 13:16   IR INTRAVSC STENT CERV CAROTID W/O EMB-PROT MOD SED Result Date: 06/05/2023 INDICATION: 83 year old male presenting with right-sided weakness and aphasia; NIHSS 28. His last known well was 10 p.m. on 06/04/2023. His past medical history significant for prior stroke, hypertension, diabetes and chronic kidney disease; baseline modified Rankin scale 0. Head CT showed hypodensity within the left insula, basal ganglia and left frontal operculum (ASPECTS 7). No IV thrombolytic given as patient was outside the window. CT angiogram of the head and neck showed an occlusion of a left M2/MCA anterior division branch. CT perfusion showed a 41 mL core infarct with a 45 mL ischemic penumbra. She was transferred to our service for mechanical thrombectomy. EXAM: ULTRASOUND-GUIDED VASCULAR ACCESS DIAGNOSTIC CEREBRAL ANGIOGRAM MECHANICAL THROMBECTOMY FLAT PANEL HEAD CT LEFT CAROTID STENTING AND ANGIOPLASTY WITHOUT CEREBRAL PROTECTION DEVICE COMPARISON:   CT/CT angiogram of the head and neck June 05, 2023. MEDICATIONS: No antibiotics administered. ANESTHESIA/SEDATION: The procedure was performed under general anesthesia. CONTRAST:  80 mL of Omnipaque 300 milligram/mL FLUOROSCOPY: Radiation Exposure Index (as provided by the fluoroscopic device): 1315 mGy Kerma COMPLICATIONS: None immediate. TECHNIQUE: Informed written consent was obtained from the patient's wife after a thorough discussion of the procedural risks, benefits and alternatives. All questions were addressed. Maximal Sterile Barrier Technique was utilized including caps, mask, sterile gowns, sterile gloves, sterile drape, hand hygiene and skin antiseptic. A timeout was performed prior to the initiation of the procedure. The right groin was prepped and draped in the usual sterile fashion. Using a micropuncture kit and the modified Seldinger technique, access was gained to the right common femoral artery and an 8 French sheath was placed. Real-time ultrasound guidance was utilized for vascular access including the acquisition of a permanent ultrasound image documenting patency of the accessed vessel. Under  fluoroscopy, an 8 [place] Walrus balloon guide catheter was navigated over a 6 [place] VTK catheter and a 0.035" Terumo Glidewire into the aortic arch. The catheter was placed into the left common carotid artery and then advanced into the left internal carotid artery. The diagnostic catheter was removed. Frontal and lateral angiograms of the head were obtained. FINDINGS: 1. Ultrasound showed heavily calcified right common femoral artery is maintained patency and caliber. 2. Proximal occlusion of a left M2/MCA anterior division branch. 3. Atherosclerotic changes of the intracranial left ICA with mild stenosis at the distal cavernous segment. 4. 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 PROCEDURE:  Using biplane roadmap guidance, a Red 62 aspiration catheter was navigated over Colossus 35 microguidewire into the cavernous segment of the left ICA. The aspiration catheter was then advanced to the level of occlusion and connected to an aspiration pump. Continuous aspiration was performed for 2 minutes. The guide catheter was connected to a VacLok syringe and the guiding catheter balloon was inflated. The aspiration catheter was subsequently removed under constant aspiration. The guide catheter was aspirated for debris. Left internal carotid artery angiograms with frontal and lateral views of the head showed complete recanalization of the left MCA vascular tree. The guide catheter was retracted into the neck. Frontal and lateral angiograms of the neck were obtained. Improvement of the degree of stenosis in the carotid bulb compared to prior CT angiogram. There is prominent luminal irregularity at the carotid bulb residual moderate stenosis. Increased tortuosity of the proximal/mid cervical left ICA with kinking. Left internal carotid artery angiograms with left anterior oblique views of the neck showed evidence of filling defects at the level of the carotid bulb. Flat panel CT of the head was obtained and post processed in a separate workstation with concurrent attending physician supervision. Selected images were sent to PACS. No evidence of hemorrhagic complication. There is mild contrast staining of the left insular and frontal cortex. Repeat left internal carotid artery angiograms with frontal and lateral views of the head showed Amy seen left M3/MCA branch to the left parietal region. Left common carotid artery angiograms with frontal and lateral views of the neck showed vertebra Gretchen of stenosis at the left ICA bulb with more prominent filling defect. At this point, patient was loaded on cangrelor followed by continuous drip. Using biplane roadmap guidance, a 4-7 mm Emboshield NAV6 cerebral protection  device was advanced into the cervical left ICA. However, multiple attempts to advance a cerebral protection device through the left ICA kinking proved unsuccessful. The cerebral protection device was subsequently removed. Using biplane roadmap guidance, a 10-8 x 40 mm XACT carotid stent was navigated and deployed from the distal left common carotid artery to the proximal left internal carotid artery, proximal to the vessel kinking. Suboptimal stent expansion was noted. Then, a 6 x 30 mm Viatrac balloon was navigated into the recently deployed stent. Angioplasty was performed under fluoroscopy. Left internal carotid artery angiograms with frontal and lateral views of the neck showed adequate stent positioning and expansion with brisk anterograde flow. Left internal carotid artery angiograms with frontal and lateral views of the head showed improvement of anterograde flow in the left MCA vascular tree with brisk anterograde flow. Delayed left common carotid artery angiograms with frontal and lateral views of the neck showed no evidence of clot formation within the stent. The catheter was subsequently Riddle. Right common femoral artery angiogram  was obtained in right anterior oblique view. The puncture is at the level of the common femoral artery. The artery has normal caliber, adequate for closure device. The sheath was exchanged over the wire for an 8 [place] Angio-Seal which was utilized for access closure. Immediate hemostasis was achieved. IMPRESSION: 1. Successful mechanical thrombectomy for treatment of a proximal left M2/MCA anterior division branch occlusion achieving complete recanalization (TICI 3). 2. 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 PLAN: Continue cangrelor infusion until patient is transitioned to oral dual antiplatelet therapy. Electronically Signed    By: Baldemar Lenis M.D.   On: 06/05/2023 13:16   IR CT Head Ltd Result Date: 06/05/2023 INDICATION: 83 year old male presenting with right-sided weakness and aphasia; NIHSS 28. His last known well was 10 p.m. on 06/04/2023. His past medical history significant for prior stroke, hypertension, diabetes and chronic kidney disease; baseline modified Rankin scale 0. Head CT showed hypodensity within the left insula, basal ganglia and left frontal operculum (ASPECTS 7). No IV thrombolytic given as patient was outside the window. CT angiogram of the head and neck showed an occlusion of a left M2/MCA anterior division branch. CT perfusion showed a 41 mL core infarct with a 45 mL ischemic penumbra. She was transferred to our service for mechanical thrombectomy. EXAM: ULTRASOUND-GUIDED VASCULAR ACCESS DIAGNOSTIC CEREBRAL ANGIOGRAM MECHANICAL THROMBECTOMY FLAT PANEL HEAD CT LEFT CAROTID STENTING AND ANGIOPLASTY WITHOUT CEREBRAL PROTECTION DEVICE COMPARISON:  CT/CT angiogram of the head and neck June 05, 2023. MEDICATIONS: No antibiotics administered. ANESTHESIA/SEDATION: The procedure was performed under general anesthesia. CONTRAST:  80 mL of Omnipaque 300 milligram/mL FLUOROSCOPY: Radiation Exposure Index (as provided by the fluoroscopic device): 1315 mGy Kerma COMPLICATIONS: None immediate. TECHNIQUE: Informed written consent was obtained from the patient's wife after a thorough discussion of the procedural risks, benefits and alternatives. All questions were addressed. Maximal Sterile Barrier Technique was utilized including caps, mask, sterile gowns, sterile gloves, sterile drape, hand hygiene and skin antiseptic. A timeout was performed prior to the initiation of the procedure. The right groin was prepped and draped in the usual sterile fashion. Using a micropuncture kit and the modified Seldinger technique, access was gained to the right common femoral artery and an 8 French sheath was placed.  Real-time ultrasound guidance was utilized for vascular access including the acquisition of a permanent ultrasound image documenting patency of the accessed vessel. Under fluoroscopy, an 8 [place] Walrus balloon guide catheter was navigated over a 6 [place] VTK catheter and a 0.035" Terumo Glidewire into the aortic arch. The catheter was placed into the left common carotid artery and then advanced into the left internal carotid artery. The diagnostic catheter was removed. Frontal and lateral angiograms of the head were obtained. FINDINGS: 1. Ultrasound showed heavily calcified right common femoral artery is maintained patency and caliber. 2. Proximal occlusion of a left M2/MCA anterior division branch. 3. Atherosclerotic changes of the intracranial left ICA with mild stenosis at the distal cavernous segment. 4. 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 PROCEDURE: Using biplane roadmap guidance, a Red 62 aspiration catheter was navigated over Colossus 35 microguidewire into the cavernous segment of the left ICA. The aspiration catheter was then advanced to the level of occlusion and connected to an aspiration pump. Continuous aspiration was performed for 2 minutes. The guide  catheter was connected to a VacLok syringe and the guiding catheter balloon was inflated. The aspiration catheter was subsequently removed under constant aspiration. The guide catheter was aspirated for debris. Left internal carotid artery angiograms with frontal and lateral views of the head showed complete recanalization of the left MCA vascular tree. The guide catheter was retracted into the neck. Frontal and lateral angiograms of the neck were obtained. Improvement of the degree of stenosis in the carotid bulb compared to prior CT angiogram. There is prominent luminal irregularity at the carotid bulb residual moderate stenosis. Increased  tortuosity of the proximal/mid cervical left ICA with kinking. Left internal carotid artery angiograms with left anterior oblique views of the neck showed evidence of filling defects at the level of the carotid bulb. Flat panel CT of the head was obtained and post processed in a separate workstation with concurrent attending physician supervision. Selected images were sent to PACS. No evidence of hemorrhagic complication. There is mild contrast staining of the left insular and frontal cortex. Repeat left internal carotid artery angiograms with frontal and lateral views of the head showed Amy seen left M3/MCA branch to the left parietal region. Left common carotid artery angiograms with frontal and lateral views of the neck showed vertebra Gretchen of stenosis at the left ICA bulb with more prominent filling defect. At this point, patient was loaded on cangrelor followed by continuous drip. Using biplane roadmap guidance, a 4-7 mm Emboshield NAV6 cerebral protection device was advanced into the cervical left ICA. However, multiple attempts to advance a cerebral protection device through the left ICA kinking proved unsuccessful. The cerebral protection device was subsequently removed. Using biplane roadmap guidance, a 10-8 x 40 mm XACT carotid stent was navigated and deployed from the distal left common carotid artery to the proximal left internal carotid artery, proximal to the vessel kinking. Suboptimal stent expansion was noted. Then, a 6 x 30 mm Viatrac balloon was navigated into the recently deployed stent. Angioplasty was performed under fluoroscopy. Left internal carotid artery angiograms with frontal and lateral views of the neck showed adequate stent positioning and expansion with brisk anterograde flow. Left internal carotid artery angiograms with frontal and lateral views of the head showed improvement of anterograde flow in the left MCA vascular tree with brisk anterograde flow. Delayed left common carotid  artery angiograms with frontal and lateral views of the neck showed no evidence of clot formation within the stent. The catheter was subsequently Riddle. Right common femoral artery angiogram was obtained in right anterior oblique view. The puncture is at the level of the common femoral artery. The artery has normal caliber, adequate for closure device. The sheath was exchanged over the wire for an 8 [place] Angio-Seal which was utilized for access closure. Immediate hemostasis was achieved. IMPRESSION: 1. Successful mechanical thrombectomy for treatment of a proximal left M2/MCA anterior division branch occlusion achieving complete recanalization (TICI 3). 2. 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 PLAN: Continue cangrelor infusion until patient is transitioned to oral dual antiplatelet therapy. Electronically Signed   By: Baldemar Lenis M.D.   On: 06/05/2023 13:16   CT ANGIO HEAD NECK W WO CM W PERF (CODE STROKE) Addendum Date: 06/05/2023 ADDENDUM REPORT: 06/05/2023 12:25 ADDENDUM: Please note, there is a dictation error within CTA neck impression #1, which should read: The common carotid and internal carotid arteries are patent within the neck.  Atherosclerotic plaque bilaterally. Most notably, there is progressive atherosclerotic plaque about the left carotid bifurcation and within the proximal left ICA with resultant severe near occlusive stenosis of the proximal left ICA. Also of note, atherosclerotic plaque about the right carotid bifurcation results in a 40% stenosis at the right ICA origin. Electronically Signed   By: Jackey Loge D.O.   On: 06/05/2023 12:25   Result Date: 06/05/2023 CLINICAL DATA:  Provided history: Cerebrovascular accident, unspecified mechanism. Right-sided weakness. Right-sided facial droop. Altered mental status. EXAM: CT  ANGIOGRAPHY HEAD AND NECK CT PERFUSION BRAIN TECHNIQUE: Multidetector CT imaging of the head and neck was performed using the standard protocol during bolus administration of intravenous contrast. Multiplanar CT image reconstructions and MIPs were obtained to evaluate the vascular anatomy. Carotid stenosis measurements (when applicable) are obtained utilizing NASCET criteria, using the distal internal carotid diameter as the denominator. Multiphase CT imaging of the brain was performed following IV bolus contrast injection. Subsequent parametric perfusion maps were calculated using RAPID software. RADIATION DOSE REDUCTION: This exam was performed according to the departmental dose-optimization program which includes automated exposure control, adjustment of the mA and/or kV according to patient size and/or use of iterative reconstruction technique. CONTRAST:  OMNIPAQUE IOHEXOL 350 MG/ML SOLN COMPARISON:  Noncontrast head CT performed earlier today 06/05/2023. MRA head and MRA neck 10/04/2019. FINDINGS: CTA NECK FINDINGS Aortic arch: Common origin of the innominate and left common carotid arteries. Atherosclerotic plaque within the visualized thoracic aorta and proximal major branch vessels of the neck. Streak/beam hardening artifact arising from a dense right-sided contrast bolus partially obscures the right subclavian artery. Within this limitation, there is no appreciable hemodynamically significant innominate or proximal subclavian artery stenosis. Right carotid system: CCA and ICA patent within the neck. Atherosclerotic plaque, greatest about the carotid bifurcation. Resultant 40% stenosis at the ICA origin. Left carotid system: CCA and ICA patent within the neck. Atherosclerotic plaque. Most notably, there is prominent atherosclerotic plaque about the carotid bifurcation and within the proximal ICA which has progressed from the prior MRA neck of 10/04/2019. Resultant severe (near occlusive) stenosis of  the proximal ICA. Tortuosity of the cervical ICA Vertebral arteries: The vertebral arteries are patent within the neck. Streak/beam hardening artifact limits evaluation of the right vertebral artery origin. At least moderate stenosis is suspected at this site. Atherosclerotic plaque scattered elsewhere within the cervical right vertebral artery with no more than mild stenosis. Calcified atherosclerotic plaque at the left vertebral artery origin with suspected at least moderate stenosis. Nonstenotic calcified plaque elsewhere within the cervical left vertebral artery. Skeleton: Cervical spondylosis. Other neck: No neck mass or cervical lymphadenopathy. Upper chest: No consolidation within the imaged lung apices. Review of the MIP images confirms the above findings CTA HEAD FINDINGS Anterior circulation: The intracranial internal carotid arteries are patent. As sclerotic plaque within both vessels. No more than mild stenosis on the right. Up to moderate stenosis within the left cavernous segment. The M1 middle cerebral arteries are patent. Abrupt occlusion of a proximal M2 left middle cerebral artery vessel (series 11, image 22). Atherosclerotic irregularity of the M2 and more distal MCA vessels elsewhere. The anterior cerebral arteries are patent. Atherosclerotic irregularity of both vessels without high-grade proximal stenosis. A possible 2 mm periophthalmic left ICA aneurysm with better appreciated on the prior MRA head of 10/04/2019. Posterior circulation: The intracranial vertebral arteries are patent. Atherosclerotic plaque within the right V4 segment sites of mild stenosis. Non-stenotic atherosclerotic plaque within the left V4 segment. The basilar artery  is patent. The posterior cerebral arteries are patent. Posterior communicating arteries are diminutive or absent, bilaterally. Venous sinuses: Assessment for dural venous sinus thrombosis is limited due to contrast timing. Anatomic variants: As described.  Review of the MIP images confirms the above findings CT Brain Perfusion Findings: ASPECTS: CBF (<30%) Volume: 41mL Perfusion (Tmax>6.0s) volume: 86mL Mismatch Volume: 45mL Infarction Location:Left MCA vascular territory CTA head impression #1, the CT perfusion head impression and the presence of a severe stenosis of the proximal cervical left ICA called by telephone at the time of interpretation on 06/05/2023 at 8:40 am to provider ERIC [facility] , who verbally acknowledged these results. IMPRESSION: CTA neck: 1. No common carotid and internal carotid arteries are patent within the neck. Atherosclerotic plaque bilaterally. Most notably, there is progressive atherosclerotic plaque about the left carotid bifurcation and within the proximal left ICA with resultant severe, near occlusive stenosis of the proximal left ICA. Also of note, atherosclerotic plaque about the right carotid bifurcation results in 40% stenosis at the right ICA origin. 2. The vertebral arteries are patent within the neck. Atherosclerotic plaque bilaterally as described. Most notably, there is suspected at least moderate stenoses at the bilateral vertebral artery origins. 3. Aortic Atherosclerosis (ICD10-I70.0). CTA head: 1. Abrupt occlusion of a proximal M2 left middle cerebral artery vessel. 2. Background intracranial atherosclerotic disease as described. 3. A possible 2 mm periophthalmic left ICA aneurysm was better appreciated on the prior MRA head of 10/04/2019. CT perfusion head: The perfusion software identifies a 41 mL core infarct in the left MCA vascular territory. The perfusion software identifies an 86 mL region of critically hypoperfused parenchyma within the left MCA vascular territory (utilizing the Tmax>6 seconds threshold). Reported mismatch volume: 45 mL Electronically Signed: By: Jackey Loge D.O. On: 06/05/2023 09:07   CT HEAD CODE STROKE WO CONTRAST Result Date: 06/05/2023 CLINICAL DATA:  Code stroke. Neuro deficit, acute, stroke  suspected. EXAM: CT HEAD WITHOUT CONTRAST TECHNIQUE: Contiguous axial images were obtained from the base of the skull through the vertex without intravenous contrast. RADIATION DOSE REDUCTION: This exam was performed according to the departmental dose-optimization program which includes automated exposure control, adjustment of the mA and/or kV according to patient size and/or use of iterative reconstruction technique. COMPARISON:  Brain MRI 10/04/2019.  Noncontrast head CT 10/03/2019. FINDINGS: Brain: Generalized cerebral atrophy. Loss of gray-white differentiation consistent with an acute infarct within the left insula and within portions of the left frontal operculum (MCA vascular territory). Known small chronic cortically-based infarcts within the left frontal, left parietal and left occipital lobes were better appreciated on the prior brain MRI of 10/04/2019 (acute at that time). Mild patchy and ill-defined hypoattenuation within the cerebral white matter, nonspecific but compatible with chronic small vessel ischemic disease. Subcentimeter infarct within the superior right cerebellar hemisphere, new from the prior MRI but chronic in appearance. Loss of gray-white differentiation there is no acute intracranial hemorrhage. No extra-axial fluid collection. No evidence of an intracranial mass. No midline shift. Vascular: No hyperdense vessel.  Atherosclerotic calcifications. Skull: No calvarial fracture or aggressive osseous lesion. Sinuses/Orbits: No mass or acute finding within the imaged orbits. No significant paranasal sinus disease. ASPECTS [facility] Stroke Program Early CT Score) - Ganglionic level infarction (caudate, lentiform nuclei, internal capsule, insula, M1-M3 cortex): 5 - Supraganglionic infarction (M4-M6 cortex): 2 Total score (0-10 with 10 being normal): 7 Impression #1 called by telephone at the time of interpretation on 06/05/2023 at 8:40 am to provider Dr. Otelia Limes, who verbally acknowledged these  results. IMPRESSION: 1. Acute left MCA territory infarct affecting the left insula and portions of the left frontal operculum. ASPECTS is 7. 2. Known small chronic cortically-based infarcts within the left frontal, left parietal and left occipital lobes were better appreciated on the prior brain MRI of 10/04/2019 (acute at that time). 3. Background mild cerebral white matter chronic small vessel ischemic disease. 4. Subcentimeter infarct within the right cerebellar hemisphere, new from prior MRI but chronic in appearance. 5. Generalized cerebral atrophy. Electronically Signed   By: Jackey Loge D.O.   On: 06/05/2023 08:45     PHYSICAL EXAM  Temp:  [96.2 F (35.7 C)-97.6 F (36.4 C)] 97.5 F (36.4 C) (02/17 0700) Pulse Rate:  [54-94] 60 (02/17 1230) Resp:  [11-24] 22 (02/17 1230) BP: (81-177)/(25-91) 119/67 (02/17 1230) SpO2:  [94 %-100 %] 98 % (02/17 1230) FiO2 (%):  [40 %] 40 % (02/17 0750) Weight:  [132.6 kg] 132.6 kg (02/17 0500)  General -intubated elderly patient in no acute distress  Cardiovascular -regular rhythm on monitor with frequent PVCs  Neuro (sedation with propofol and fentanyl) pupils equal and round but small and sluggish corneal reflexes present but weak, cough and gag reflex present.  Patient does not respond to voice or follow commands and has no response to noxious stimuli and no spontaneous movement   ASSESSMENT/PLAN Mr. Onesimo Lingard. is a 83 y.o. male with history of hypertension, diabetes, CKD 3, stroke admitted for right-sided weakness numbness, aphasia, right facial droop, left gaze preference. No TNK given due to outside window.  Patient underwent mechanical thrombectomy with left ICA stenting.   2/15 in the morning patient had an episode of vomiting with coffee-ground emesis and subsequent tachycardia and tachypnea.  On evaluation this morning, he had increased work of breathing and was unable to maintain his SpO2 even on 100% O2 via nonrebreather.   Discussion was had with patient's wife and daughter, and family stated they would like to proceed with intubation and would like for patient to remain full code at this time.  CCM was consulted, and patient was transferred to ICU and intubated.  He did have an episode of aspiration during intubation and bronchoscopy was subsequently performed.  He was noted to be hypotensive after intubation, and Levophed and vasopressin were started.  Will hold Plavix for now given coffee-ground emesis.  Patient's creatinine was also noted to have worsened.  Stroke:  left MCA infarct with left M2 occlusion and left ICA near occlusion s/p IR with TICI3 and confluent HT, likely secondary to large vessel disease source versus cardiomyopathy with low EF CT left MCA infarct CT head and neck left M2 occlusion, left ICA near occlusion, right ICA 43 stenosis, bilateral VA origin severe stenosis CTP 41/86 Status post IR with TICI3 and left ICA stenting MRI left MCA infarct at left insular and left frontal operculum, prominent and confluent petechial hemorrhagic transformation CT repeat 2/7 stable confluent petechial hemorrhage 2D Echo EF 30% LDL 107 HgbA1c 6.1 P2 Y12 = 73 UDS negative SCDs for VTE prophylaxis aspirin 81 mg daily and clopidogrel 75 mg daily prior to admission, now ASA d/c'ed and Plavix on hold given GI bleed and anemia Ongoing aggressive stroke risk factor management Therapy recommendations:  SNF Disposition: Pending, will consult palliative care for GOC discussion  History of stroke 10/08/2019 admitted for left MCA infarct due to right upper extremity weakness.  MRA head and neck showed left ICA 30% stenosis.  EF 45 to 50%.  LDL 105,  A1c 5.6.  Recommended loop recorder at that time but only got 30-day CardioNet monitoring which was no A-fib.  Discharged on DAPT and Lipitor 80.  Respiratory failure Leukocytosis On 2/15, patient had episode of emesis with probable subsequent aspiration and went into  respiratory distress with increased work of breathing unable to maintain SpO2 After discussion with family, patient intubated for airway protection and given respiratory failure Now no propofol and fentanyl Ventilator management per CCM WBC 10.1--23.5--44.5--41.4--29.9 Now on cefepime Off vancomycin  GI bleeding Acute blood loss anemia Patient had 2 episodes of coffee-ground emesis, likely stress ulcer Hold Plavix for now given GI bleed Hemoglobin 11.2-> 8.4->PRBC->8.8->8.1 Close monitoring  Cardiomyopathy CHF 09/2019 EF 45 to 50% Current admission EF 30% Cardiology on board, appreciate assistance Was on DAPT, now on hold due to GIB Agree with GDMT (losartan, entresto, metoprolol and spironolactone) as BP and Cr tolerates May consider loop recorder placement if neuro improves significantly  Diabetes HgbA1c 6.1 goal < 7.0 Hyperglycemia improved  Now off insulin drip CBG monitoring SSI  DM education and close PCP follow up  History of hypertension, now hypotensive Unstable on the low end requiring Levophed, epi and vasopressin Long term BP goal normotensive  Hyperlipidemia Home meds: Lipitor 80 LDL 105, goal < 70 Now lipitor 80 and Zetia  Continue statin and zetia at discharge  Dysphagia Poststroke dysphagia Speech on board N.p.o. for now given intubation OG tube reinserted  AKI on CKD  Creatinine 1.78->3.41-> 4.61->4.07 Baseline creatinine around 1.5 Renally dose medications as appropriate Avoid contrast Aggressive treat hypotension  Other Stroke Risk Factors Advanced age Obesity, Body mass index is 41.94 kg/m.   Other Active Problems Presyncopal episode with mild hypotension 2/14-suspect was due to straining to have a bowel movement, will prescribe as needed suppository  Hospital day # 12    Marvel Plan, MD PhD Stroke Neurology 06/17/2023 1:26 PM  This patient is critically ill due to stroke status post IR, respiratory failure, hypotension needing  pressor, AKI, aspiration pneumonia and GI bleeding and at significant risk of neurological worsening, death form sepsis, septic shock, severe anemia, heart failure and renal failure. This patient's care requires constant monitoring of vital signs, hemodynamics, respiratory and cardiac monitoring, review of multiple databases, neurological assessment, discussion with family, other specialists and medical decision making of high complexity. I spent 40 minutes of neurocritical care time in the care of this patient. I had long discussion with daughter and the wife at bedside, updated pt current condition, treatment plan and potential prognosis, and answered all the questions.  They expressed understanding and appreciation.

## 2023-06-18 ENCOUNTER — Inpatient Hospital Stay (HOSPITAL_COMMUNITY): Payer: No Typology Code available for payment source

## 2023-06-18 DIAGNOSIS — K920 Hematemesis: Secondary | ICD-10-CM | POA: Diagnosis not present

## 2023-06-18 DIAGNOSIS — Z7189 Other specified counseling: Secondary | ICD-10-CM | POA: Diagnosis not present

## 2023-06-18 DIAGNOSIS — Z515 Encounter for palliative care: Secondary | ICD-10-CM | POA: Diagnosis not present

## 2023-06-18 DIAGNOSIS — J9601 Acute respiratory failure with hypoxia: Secondary | ICD-10-CM | POA: Diagnosis not present

## 2023-06-18 DIAGNOSIS — J690 Pneumonitis due to inhalation of food and vomit: Secondary | ICD-10-CM | POA: Diagnosis not present

## 2023-06-18 DIAGNOSIS — I639 Cerebral infarction, unspecified: Secondary | ICD-10-CM | POA: Diagnosis not present

## 2023-06-18 LAB — GLUCOSE, CAPILLARY
Glucose-Capillary: 120 mg/dL — ABNORMAL HIGH (ref 70–99)
Glucose-Capillary: 133 mg/dL — ABNORMAL HIGH (ref 70–99)
Glucose-Capillary: 139 mg/dL — ABNORMAL HIGH (ref 70–99)
Glucose-Capillary: 145 mg/dL — ABNORMAL HIGH (ref 70–99)
Glucose-Capillary: 145 mg/dL — ABNORMAL HIGH (ref 70–99)
Glucose-Capillary: 185 mg/dL — ABNORMAL HIGH (ref 70–99)

## 2023-06-18 LAB — CBC
HCT: 24.6 % — ABNORMAL LOW (ref 39.0–52.0)
Hemoglobin: 7.6 g/dL — ABNORMAL LOW (ref 13.0–17.0)
MCH: 30.6 pg (ref 26.0–34.0)
MCHC: 30.9 g/dL (ref 30.0–36.0)
MCV: 99.2 fL (ref 80.0–100.0)
Platelets: 157 10*3/uL (ref 150–400)
RBC: 2.48 MIL/uL — ABNORMAL LOW (ref 4.22–5.81)
RDW: 14 % (ref 11.5–15.5)
WBC: 19.5 10*3/uL — ABNORMAL HIGH (ref 4.0–10.5)
nRBC: 0 % (ref 0.0–0.2)

## 2023-06-18 LAB — CULTURE, BAL-QUANTITATIVE W GRAM STAIN

## 2023-06-18 LAB — BASIC METABOLIC PANEL
Anion gap: 12 (ref 5–15)
BUN: 69 mg/dL — ABNORMAL HIGH (ref 8–23)
CO2: 22 mmol/L (ref 22–32)
Calcium: 8.3 mg/dL — ABNORMAL LOW (ref 8.9–10.3)
Chloride: 107 mmol/L (ref 98–111)
Creatinine, Ser: 3.85 mg/dL — ABNORMAL HIGH (ref 0.61–1.24)
GFR, Estimated: 15 mL/min — ABNORMAL LOW (ref >60)
Glucose, Bld: 192 mg/dL — ABNORMAL HIGH (ref 70–99)
Potassium: 4.2 mmol/L (ref 3.5–5.1)
Sodium: 141 mmol/L (ref 135–145)

## 2023-06-18 LAB — COOXEMETRY PANEL
Carboxyhemoglobin: 2.2 % — ABNORMAL HIGH (ref 0.5–1.5)
Methemoglobin: 1.1 % (ref 0.0–1.5)
O2 Saturation: 76.8 %
Total hemoglobin: 7.8 g/dL — ABNORMAL LOW (ref 12.0–16.0)

## 2023-06-18 MED ORDER — METOCLOPRAMIDE HCL 5 MG/ML IJ SOLN
5.0000 mg | Freq: Four times a day (QID) | INTRAMUSCULAR | Status: DC
  Administered 2023-06-18 – 2023-06-23 (×20): 5 mg via INTRAVENOUS
  Filled 2023-06-18 (×20): qty 2

## 2023-06-18 MED ORDER — THIAMINE MONONITRATE 100 MG PO TABS
100.0000 mg | ORAL_TABLET | Freq: Every day | ORAL | Status: AC
  Administered 2023-06-19 – 2023-06-20 (×2): 100 mg
  Filled 2023-06-18 (×2): qty 1

## 2023-06-18 MED ORDER — SODIUM CHLORIDE 0.9 % IV SOLN
2.0000 g | INTRAVENOUS | Status: AC
  Administered 2023-06-18 – 2023-06-21 (×4): 2 g via INTRAVENOUS
  Filled 2023-06-18 (×4): qty 20

## 2023-06-18 MED ORDER — HYDROCORTISONE SOD SUC (PF) 100 MG IJ SOLR
50.0000 mg | Freq: Every day | INTRAMUSCULAR | Status: DC
  Administered 2023-06-19: 50 mg via INTRAVENOUS
  Filled 2023-06-18: qty 2

## 2023-06-18 MED ORDER — PROSOURCE TF20 ENFIT COMPATIBL EN LIQD
60.0000 mL | Freq: Four times a day (QID) | ENTERAL | Status: DC
  Administered 2023-06-18 – 2023-06-21 (×12): 60 mL
  Filled 2023-06-18 (×12): qty 60

## 2023-06-18 MED ORDER — CLOPIDOGREL BISULFATE 75 MG PO TABS
75.0000 mg | ORAL_TABLET | Freq: Every day | ORAL | Status: DC
Start: 2023-06-18 — End: 2023-06-27
  Administered 2023-06-18 – 2023-06-27 (×10): 75 mg
  Filled 2023-06-18 (×10): qty 1

## 2023-06-18 MED ORDER — DEXMEDETOMIDINE HCL IN NACL 400 MCG/100ML IV SOLN
0.0000 ug/kg/h | INTRAVENOUS | Status: DC
  Administered 2023-06-18: 0.9 ug/kg/h via INTRAVENOUS
  Administered 2023-06-18: 0.4 ug/kg/h via INTRAVENOUS
  Administered 2023-06-18: 0.8 ug/kg/h via INTRAVENOUS
  Administered 2023-06-19: 0.4 ug/kg/h via INTRAVENOUS
  Administered 2023-06-19: 0.5 ug/kg/h via INTRAVENOUS
  Administered 2023-06-19: 0.9 ug/kg/h via INTRAVENOUS
  Administered 2023-06-19: 0.4 ug/kg/h via INTRAVENOUS
  Administered 2023-06-20: 0.2 ug/kg/h via INTRAVENOUS
  Administered 2023-06-21: 0.3 ug/kg/h via INTRAVENOUS
  Filled 2023-06-18 (×2): qty 100
  Filled 2023-06-18: qty 200
  Filled 2023-06-18: qty 100
  Filled 2023-06-18 (×2): qty 200
  Filled 2023-06-18 (×2): qty 100

## 2023-06-18 MED ORDER — OSMOLITE 1.5 CAL PO LIQD
480.0000 mL | ORAL | Status: DC
  Administered 2023-06-18 – 2023-06-19 (×2): 480 mL
  Filled 2023-06-18 (×3): qty 711

## 2023-06-18 NOTE — Plan of Care (Signed)
  Problem: Education: Goal: Knowledge of disease or condition will improve Outcome: Progressing   Problem: Ischemic Stroke/TIA Tissue Perfusion: Goal: Complications of ischemic stroke/TIA will be minimized Outcome: Progressing   Problem: Health Behavior/Discharge Planning: Goal: Goals will be collaboratively established with patient/family Outcome: Progressing   Problem: Nutrition: Goal: Risk of aspiration will decrease Outcome: Progressing   Problem: Coping: Goal: Level of anxiety will decrease Outcome: Progressing   Problem: Pain Managment: Goal: General experience of comfort will improve and/or be controlled Outcome: Progressing   Problem: Safety: Goal: Ability to remain free from injury will improve Outcome: Progressing   Problem: Skin Integrity: Goal: Risk for impaired skin integrity will decrease Outcome: Progressing

## 2023-06-18 NOTE — Progress Notes (Signed)
 PT Cancellation Note  Patient Details Name: Nathan Proctor. MRN: [id number] DOB: May 22, 1940   Cancelled Treatment:    Reason Eval/Treat Not Completed: Medical issues which prohibited therapy (pt intubated, sedated, on pressors).   Lyanne Co, PT  Acute Rehab Services Secure chat preferred Office [phone]    Elyse Hsu 06/18/2023, 8:47 AM

## 2023-06-18 NOTE — Progress Notes (Signed)
 Palliative:  HPI: 83 y.o. male  with past medical history of HTN, HLD, diabetes, CKD stage 3b, prior CVA with no residual deficits, prostate cancer, failure to thrive admitted on 06/05/2023 with unresponsiveness with right-sided deficits found to have left MCA infarct with left M2 occlusion and left ICA near occlusion s/p IR with mechanical thrombectomy and stenting 2/5. 2/15 aspiration event resulting in severe shock requiring intubation. Hospitalization also complicated by acute GIB and CHF EF 30%.    I discussed with RN. I met today at Mr. Steiner' bedside along with wife and daughter, Ines Bloomer, joined over speaker phone. We reviewed Mr. Iddings' status today. He has decreased vasopressor needs and labs are showing some improvements but still far from normal. Family expresses some concerns from family regarding conversations. Ines Bloomer shares that family has had multiple other palliative and end of life experiences and they have had very different experiences with various palliative teams. They enquire about our health system's palliative care policies for what type of care is allowed or not allowed and what this looks like (specifically regarding artificial feeding). I explained that palliative care looks different for each patient and may look different from day to day depending on situation and goals of care. I expressed my hope to help ensure that family is receiving all the information from the medical team to make informed decisions for their loved one with care that they feel would align with what he would desire for himself. We reviewed my goal to help them explore the risks vs benefits of each intervention and decision to aide them in their decisions. I assured them that we have no specific rules or interventions that are allowed or not allowed - just whatever is best for Mr. Bargo.   We spent time reviewing conversations and paths forward. Family expresses the importance of accepting the  recommendations and advice from the medical team but ask for understanding that their definition for quality of life for Mr. Toops' may not be the same for our definition of quality of life. They would like to spend some more time considering options and do not want to jump into anything too quickly. They remain hopeful that he will have further improvement with more time. We discussed the importance for family to have time for thoughtful discussions to make decisions. I reassured them that I will help support them during their journey and advocate for them along the way. I acknowledged the difficulty in this situation and that all these decisions are extremely difficult to make. Wife shares how we often avoid the difficult discussions especially regarding death and dying but this does not prevent [place] from having to ultimate face these times. They will continue to take all the information and advice from the medical team as well as their faith and knowledge of Mr. Goh and his values to determine best path forward.   All questions/concerns addressed. Emotional support provided.   Exam: Sedated on vent. No distress. Not following commands or opening eyes. Less vasopressor requirements. Tolerating ventilator support via ETT. Abd soft. Bilateral toes cool to touch. BLE edema.   Plan: - Full scope - Ongoing palliative support  70 min  Yong Channel, NP Palliative Medicine Team Pager [phone] (Please see [email or web address] for schedule) Team Phone [phone]

## 2023-06-18 NOTE — Progress Notes (Addendum)
 STROKE TEAM PROGRESS NOTE   SUBJECTIVE (INTERVAL HISTORY) Wife and daughter are at the bedside. Patient still intubated, briefly open eyes on voice.  No more GI bleeding, will start trickle feeds.  Patient now only on 1 pressor, but still peripheral and fentanyl with intermittent coughing and as needed sedation.  OBJECTIVE Temp:  [98 F (36.7 C)-99.9 F (37.7 C)] 98 F (36.7 C) (02/18 1600) Pulse Rate:  [60-103] 79 (02/18 1600) Cardiac Rhythm: Normal sinus rhythm;Sinus bradycardia (02/18 1200) Resp:  [14-27] 16 (02/18 1600) BP: (93-158)/(43-118) 145/57 (02/18 1600) SpO2:  [87 %-100 %] 98 % (02/18 1600) FiO2 (%):  [40 %] 40 % (02/18 1521) Weight:  [132.2 kg] 132.2 kg (02/18 0500)  Recent Labs  Lab 06/17/23 2311 06/18/23 0314 06/18/23 0724 06/18/23 1127 06/18/23 1525  GLUCAP 218* 185* 145* 145* 139*   Recent Labs  Lab 06/15/23 2304 06/16/23 0414 06/16/23 1611 06/17/23 0423 06/18/23 0124  NA 143 141 142 143 141  K 5.3* 5.3* 4.6 4.6 4.2  CL 107 107 108 109 107  CO2 21* 20* 22 20* 22  GLUCOSE 169* 180* 162* 150* 192*  BUN 69* 68* 66* 67* 69*  CREATININE 4.35* 4.61* 4.14* 4.07* 3.85*  CALCIUM 8.4* 8.4* 8.2* 8.6* 8.3*  MG 2.0 2.1  --  2.1  --   PHOS  --   --   --  4.8*  --    Recent Labs  Lab 06/16/23 0414 06/17/23 0423  AST 44* 35  ALT 30 29  ALKPHOS 45 46  BILITOT 1.1 1.1  PROT 5.5* 5.4*  ALBUMIN 2.5* 2.2*    Recent Labs  Lab 06/16/23 0414 06/16/23 1720 06/17/23 0423 06/17/23 1715 06/18/23 0758  WBC 41.4* 33.5* 29.9* 25.0* 19.5*  HGB 8.8* 8.2* 8.1* 8.0* 7.6*  HCT 28.3* 26.5* 25.4* 25.1* 24.6*  MCV 100.0 99.6 98.4 98.8 99.2  PLT 219 167 157 146* 157   No results for input(s): "CKTOTAL", "CKMB", "CKMBINDEX", "TROPONINI" in the last 168 hours. No results for input(s): "LABPROT", "INR" in the last 72 hours.  No results for input(s): "COLORURINE", "LABSPEC", "PHURINE", "GLUCOSEU", "HGBUR", "BILIRUBINUR", "KETONESUR", "PROTEINUR", "UROBILINOGEN",  "NITRITE", "LEUKOCYTESUR" in the last 72 hours.  Invalid input(s): "APPERANCEUR"      Component Value Date/Time   CHOL 183 06/06/2023 0557   TRIG 97 06/16/2023 0414   HDL 60 06/06/2023 0557   CHOLHDL 3.1 06/06/2023 0557   VLDL 16 06/06/2023 0557   LDLCALC 107 (H) 06/06/2023 0557   LDLCALC 223 (H) 05/31/2021 0000   Lab Results  Component Value Date   HGBA1C 6.1 (H) 06/05/2023      Component Value Date/Time   LABOPIA NONE DETECTED 06/05/2023 1653   COCAINSCRNUR NONE DETECTED 06/05/2023 1653   LABBENZ NONE DETECTED 06/05/2023 1653   AMPHETMU NONE DETECTED 06/05/2023 1653   THCU NONE DETECTED 06/05/2023 1653   LABBARB NONE DETECTED 06/05/2023 1653    No results for input(s): "ETH" in the last 168 hours.   I have personally reviewed the radiological images below and agree with the radiology interpretations.  DG Abd Portable 1V Result Date: 06/18/2023 CLINICAL DATA:  Orogastric tube placement. EXAM: PORTABLE ABDOMEN - 1 VIEW COMPARISON:  Radiographs 06/05/2023.  Abdominal CT 06/16/2023. FINDINGS: 1236 hours. Tip of the enteric tube projects over the right upper quadrant of the abdomen, likely in the distal stomach. There is a small amount contrast material within the stomach. The visualized bowel gas pattern is normal. Patient is rotated to the right with persistent right  basilar airspace disease and a small right pleural effusion. IMPRESSION: Enteric tube tip projects over the distal stomach. Electronically Signed   By: Carey Bullocks M.D.   On: 06/18/2023 14:48   CT CHEST ABDOMEN PELVIS WO CONTRAST Result Date: 06/16/2023 CLINICAL DATA:  Septicemia.  Pneumonia. EXAM: CT CHEST, ABDOMEN AND PELVIS WITHOUT CONTRAST TECHNIQUE: Multidetector CT imaging of the chest, abdomen and pelvis was performed following the standard protocol without IV contrast. RADIATION DOSE REDUCTION: This exam was performed according to the departmental dose-optimization program which includes automated exposure  control, adjustment of the mA and/or kV according to patient size and/or use of iterative reconstruction technique. COMPARISON:  CT angio chest from 04/04/2016. FINDINGS: CT CHEST FINDINGS Cardiovascular: Heart size is normal. Aortic atherosclerosis and multi vessel coronary artery calcifications. No pericardial effusion. Mediastinum/Nodes: Thyroid gland, trachea and esophagus appear normal. ET tube is in place with tip above the carina. There is a enteric tube with tip in the stomach.No enlarged mediastinal lymph nodes. Hilar lymph nodes are suboptimally evaluated due to lack of IV contrast. Lungs/Pleura: No pleural effusion or interstitial edema. Bilateral lower lobe airspace consolidation is identified, right greater than left. Imaging findings compatible with multifocal pneumonia. Chronic subsegmental atelectasis this is within the right middle lobe and right lower lobe with asymmetric volume loss and elevation of the right hemidiaphragm. Musculoskeletal: No chest wall mass or suspicious bone lesions identified. CT ABDOMEN PELVIS FINDINGS Hepatobiliary: No focal liver abnormality. Gallbladder appears normal. No signs of bile duct dilatation. Pancreas: Unremarkable. No pancreatic ductal dilatation or surrounding inflammatory changes. Spleen: Normal in size without focal abnormality. Adrenals/Urinary Tract: Normal adrenal glands. Multiple stones identified within the upper pole of the left kidney which measure up to 7 mm. Bilateral exophytic kidney lesions of varying complexity are identified. These are suboptimally assessed on the current exam reflecting lack of IV contrast material. No signs of hydronephrosis. Urinary bladder is decompressed around a Foley catheter. Stomach/Bowel: Enteric tube tip is in the proximal stomach. There is retained enteric contrast material within the gastric fundus. The appendix is visualized and appears normal. There are several dilated loops of small bowel within the right  hemiabdomen. The proximal small bowel loops are normal in caliber. Within the right upper quadrant of the abdomen there are multiple dilated loops of small bowel measuring up to 3.5 cm containing air-fluid levels. The bowel loops proximal and distal are normal in caliber. Within the limitations of unenhanced technique there is no significant bowel wall thickening. Distal colonic diverticulosis identified without signs of acute diverticulitis. Retained enteric contrast material identified within the colon up to the rectum Vascular/Lymphatic: Aortic atherosclerosis without aneurysm. No signs of abdominopelvic adenopathy. Reproductive: Prostate gland is either atrophic or surgically absent. Penile prosthesis identified. Other: No free fluid or fluid collections. Musculoskeletal: No acute or significant osseous findings. IMPRESSION: 1. Bilateral lower lobe airspace consolidation, right greater than left. Imaging findings compatible with multifocal pneumonia. 2. Chronic subsegmental atelectasis within the right middle lobe and right lower lobe with asymmetric volume loss and elevation of the right hemidiaphragm. 3. Assessment of bowel pathology is limited due to lack of IV and enteric contrast material. There are multiple abnormally dilated loops of small bowel within the right hemiabdomen. Findings are compatible with a small-bowel obstruction. As there is normal caliber proximal small bowel and normal caliber distal small bowel findings are concerning for either close loop obstruction or internal hernia. 4. Distal colonic diverticulosis without signs of acute diverticulitis. 5. Nonobstructing left renal calculi. 6.  Bilateral exophytic kidney lesions of varying complexity are identified. These are suboptimally assessed on the current exam reflecting lack of IV contrast material. Further evaluation with nonemergent renal ultrasound is advised. 7.  Aortic Atherosclerosis (ICD10-I70.0). Electronically Signed   By: Signa Kell M.D.   On: 06/16/2023 15:51   ECHOCARDIOGRAM LIMITED Result Date: 06/15/2023    ECHOCARDIOGRAM LIMITED REPORT   Patient Name:   Prakash Kimberling. Date of Exam: 06/15/2023 Medical Rec #:  [id number]              Height:       70.0 in Accession #:    [id number]             Weight:       263.9 lb Date of Birth:  1941-01-26              BSA:          2.348 m Patient Age:    83 years               BP:           121/53 mmHg Patient Gender: M                      HR:           90 bpm. Exam Location:  Inpatient Procedure: Limited Echo, Color Doppler and Intracardiac Opacification Agent            (Both Spectral and Color Flow Doppler were utilized during            procedure). Indications:    Stroke i63.9, Shock  History:        Patient has prior history of Echocardiogram examinations, most                 recent 06/05/2023. CHF; Risk Factors:Hypertension and                 Dyslipidemia.  Sonographer:    Irving Burton Senior RDCS Referring Phys: [phone] [facility] JANE ELLISON  Sonographer Comments: Technically difficult due to body habitus, scanned supine on artificial repirator IMPRESSIONS  1. Very difficult to determine LVEF to limited visualization and frequent ectopy. . Left ventricular ejection fraction, by estimation, is 40 to 45%. The left ventricle has mildly decreased function. Left ventricular endocardial border not optimally defined to evaluate regional wall motion.  2. Right ventricular systolic function was not well visualized. The right ventricular size is not well visualized.  3. The inferior vena cava is normal in size with <50% respiratory variability, suggesting right atrial pressure of 8 mmHg.  4. Limited echo FINDINGS  Left Ventricle: Very difficult to determine LVEF to limited visualization and frequent ectopy. Left ventricular ejection fraction, by estimation, is 40 to 45%. The left ventricle has mildly decreased function. Left ventricular endocardial border not optimally defined to evaluate regional  wall motion. Definity contrast agent was given IV to delineate the left ventricular endocardial borders. Right Ventricle: The right ventricular size is not well visualized. Right vetricular wall thickness was not well visualized. Right ventricular systolic function was not well visualized. Venous: The inferior vena cava is normal in size with less than 50% respiratory variability, suggesting right atrial pressure of 8 mmHg. Additional Comments: Color Doppler performed.  Dina Rich MD Electronically signed by Dina Rich MD Signature Date/Time: 06/15/2023/4:31:38 PM    Final    DG Abd 1 View Result Date: 06/15/2023 CLINICAL DATA:  Orogastric tube  placement. EXAM: ABDOMEN - 1 VIEW COMPARISON:  Radiograph 06/08/2023 FINDINGS: Tip of the enteric tube below the diaphragm in the stomach, the side port is just beyond the gastroesophageal junction. There is gaseous gastric distension. Dilated bowel in the right abdomen up 4.7 cm, potentially small bowel, but incompletely included in the field of view. IMPRESSION: 1. Tip of the enteric tube below the diaphragm in the stomach, side-port just beyond the gastroesophageal junction. 2. Gaseous gastric distension. Additional dilated bowel in the right abdomen likely small bowel, although incompletely included in the field of view. Electronically Signed   By: Narda Rutherford M.D.   On: 06/15/2023 15:06   DG CHEST PORT 1 VIEW Result Date: 06/15/2023 CLINICAL DATA:  10031 Cough 10031 EXAM: PORTABLE CHEST 1 VIEW COMPARISON:  June 08, 2023 FINDINGS: The cardiomediastinal silhouette is unchanged in contour.Unchanged elevation of the RIGHT hemidiaphragm. No pleural effusion. No pneumothorax. Bibasilar platelike opacities. Atherosclerotic calcifications. Similar background of interstitial prominence. IMPRESSION: Bibasilar platelike opacities, favored to reflect atelectasis. Electronically Signed   By: Meda Klinefelter M.D.   On: 06/15/2023 12:07   DG CHEST PORT 1  VIEW Result Date: 06/15/2023 CLINICAL DATA:  Respiratory failure, intubation and central line placement. EXAM: PORTABLE CHEST 1 VIEW COMPARISON:  Film earlier today at 0734 hours FINDINGS: Interval intubation with the endotracheal tube tip approximately 3.5 cm above the carina. Left jugular central line placement with the line tip in the upper SVC. No pneumothorax. Volume loss of the right lung remains with elevation of the right hemidiaphragm. Improved expansion of the left lung. Potential underlying mild pulmonary interstitial edema. No significant pleural effusions. IMPRESSION: 1. Interval intubation and left jugular central line placement. No pneumothorax. 2. Improved expansion of the left lung. Persistent volume loss of the right lung with elevation of the right hemidiaphragm. Potential underlying mild pulmonary interstitial edema. Electronically Signed   By: Irish Lack M.D.   On: 06/15/2023 10:42   DG Swallowing Func-Speech Pathology Result Date: 06/12/2023 Table formatting from the original result was not included. Modified Barium Swallow Study Patient Details Name: Jamareon Shimel. MRN: [id number] Date of Birth: 1940/12/31 Today's Date: 06/12/2023 HPI/PMH: HPI: Pt is a 83 y.o. male who was admitted as a code stroke on 02/05 due to acute onset of right-sided weakness, right facial droop and aphasia. MRI from 02/06 displayed an evolving acute left MCA infarct, most pronounced at the left insula and left frontal operculum. Pt has intentionally lost 300 pounds in the recent past, with pre-loss weight at ~600Ib. PMHx includes hypertension, diabetes, CKD, prior CVA. Clinical Impression: Clinical Impression: Pt presents with a mild-moderate oropharyngeal dysphagia (DIGEST Score: 1) primarily characterized by silent penetration of thin liquid at level of VFs, flash penetration of nectar-thick above VFs, and diffuse pharyngeal residue. Pt's dysphagia severity level dropped from DIGEST score of 3 to 1,  compared to previous MBS eval on 02/07.     Pharyngeal residue was present throughout all consistencies. Pt's posture for swallowing was consistently neutral. Pt follows commands for compensatory strategies more readily now with max multimodal cueing. Effective compensatory strategies include multiple swallows, throat clearing, and a straw when used with a verbally cued slow rate. Multiple consecutive sips of thin-liquid presented the most risk for aspiration due to silent penetration of Vfs. Unlike previous MBS study, pt tolerated solid consistency, displaying prolonged mastication but majority clearance of bolus after 2-3 swallows.     Recommendations include upgrading pt's diet to dysphagia 2 (finely chopped) and thin liquids to  optimize safety and efficiency for swallowing. SLP f/u to monitor tolerance of thin-liquids with compensatory strategies is highly recommended. Pt needs to remain upright for 30 minutes after PO intake due to remaining pharyngeal residue to optimize airway safety. Factors that may increase risk of adverse event in presence of aspiration [name] & Clearance [name] 2021): Factors that may increase risk of adverse event in presence of aspiration [name] & Clearance [name] 2021): Dependence for feeding and/or oral hygiene Recommendations/Plan: Swallowing Evaluation Recommendations Swallowing Evaluation Recommendations Recommendations: PO diet PO Diet Recommendation: Dysphagia 2 (Finely chopped); Thin liquids (Level 0) Liquid Administration via: Straw Medication Administration: Crushed with puree Supervision: Full supervision/cueing for swallowing strategies; Full assist for feeding Swallowing strategies  : Slow rate; Small bites/sips; Multiple dry swallows after each bite/sip; Clear throat intermittently Postural changes: Position pt fully upright for meals; Stay upright 30-60 min after meals Oral care recommendations: Oral care BID (2x/day); Staff/trained caregiver to provide oral care Treatment Plan  Treatment Plan Follow-up recommendations: Follow physicians's recommendations for discharge plan and follow up therapies Functional status assessment: Patient has had a recent decline in their functional status and demonstrates the ability to make significant improvements in function in a reasonable and predictable amount of time. Recommendations Recommendations for follow up therapy are one component of a multi-disciplinary discharge planning process, led by the attending physician.  Recommendations may be updated based on patient status, additional functional criteria and insurance authorization. Assessment: Orofacial Exam: Orofacial Exam Oral Cavity: Oral Hygiene: WFL Oral Cavity - Dentition: Adequate natural dentition Orofacial Anatomy: WFL Anatomy: Anatomy: WFL Boluses Administered: Boluses Administered Boluses Administered: Thin liquids (Level 0); Mildly thick liquids (Level 2, nectar thick); Moderately thick liquids (Level 3, honey thick); Puree; Solid  Oral Impairment Domain: Oral Impairment Domain Lip Closure: Interlabial escape, no progression to anterior lip Tongue control during bolus hold: Cohesive bolus between tongue to palatal seal Bolus preparation/mastication: Slow prolonged chewing/mashing with complete recollection Bolus transport/lingual motion: Brisk tongue motion Oral residue: Residue collection on oral structures Location of oral residue : Tongue Initiation of pharyngeal swallow : Pyriform sinuses  Pharyngeal Impairment Domain: Pharyngeal Impairment Domain Soft palate elevation: No bolus between soft palate (SP)/pharyngeal wall (PW) Laryngeal elevation: Partial superior movement of thyroid cartilage/partial approximation of arytenoids to epiglottic petiole Anterior hyoid excursion: Complete anterior movement Epiglottic movement: Complete inversion Laryngeal vestibule closure: Incomplete, narrow column air/contrast in laryngeal vestibule Pharyngeal stripping wave : Present - complete  Pharyngeal contraction (A/P view only): N/A Pharyngoesophageal segment opening: Complete distension and complete duration, no obstruction of flow Tongue base retraction: Trace column of contrast or air between tongue base and PPW Pharyngeal residue: Collection of residue within or on pharyngeal structures Location of pharyngeal residue: Diffuse (>3 areas)  Esophageal Impairment Domain: Esophageal Impairment Domain Esophageal clearance upright position: -- (Not tested) Pill: No data recorded Penetration/Aspiration Scale Score: Penetration/Aspiration Scale Score 1.  Material does not enter airway: Solid; Puree; Moderately thick liquids (Level 3, honey thick) 2.  Material enters airway, remains ABOVE vocal cords then ejected out: Mildly thick liquids (Level 2, nectar thick) 5.  Material enters airway, CONTACTS cords and not ejected out: Thin liquids (Level 0) Compensatory Strategies: Compensatory Strategies Compensatory strategies: Yes Straw: Effective Effective Straw: Mildly thick liquid (Level 2, nectar thick) (Effective with slow rate applied.) Multiple swallows: Effective Effective Multiple Swallows: Thin liquid (Level 0); Mildly thick liquid (Level 2, nectar thick); Moderately thick liquid (Level 3, honey thick); Puree; Solid   General Information: Caregiver present: No  Diet Prior to this Study:  Dysphagia 1 (pureed); Moderately thick liquids (Level 3, honey thick)   Temperature : Normal   Respiratory Status: WFL   Supplemental O2: None (Room air)   History of Recent Intubation: Yes  Behavior/Cognition: Alert; Cooperative; Pleasant mood; Requires cueing Self-Feeding Abilities: Needs assist with self-feeding Baseline vocal quality/speech: Normal Volitional Cough: Unable to elicit Volitional Swallow: Able to elicit Exam Limitations: No limitations Goal Planning: Prognosis for improved oropharyngeal function: Good Barriers to Reach Goals: Language deficits No data recorded No data recorded No data recorded Pain: Pain  Assessment Pain Assessment: No/denies pain Pain Score: 0 End of Session: Start Time:SLP Start Time (ACUTE ONLY): 1310 Stop Time: SLP Stop Time (ACUTE ONLY): 1335 Time Calculation:SLP Time Calculation (min) (ACUTE ONLY): 25 min Charges: SLP Evaluations $ SLP Speech Visit: 1 Visit SLP Evaluations $MBS Swallow: 1 Procedure $Swallowing Treatment: 1 Procedure SLP visit diagnosis: SLP Visit Diagnosis: Dysphagia, oropharyngeal phase (R13.12) Past Medical History: Past Medical History: Diagnosis Date  Anemia of chronic disease   Ankle fracture 02/15/2016  Cancer (HCC)   Prostate  Chronic constipation   Chronic kidney disease   stage III  Diabetes mellitus without complication (HCC)   type II   Diabetic peripheral neuropathy (HCC)   Failure to thrive (0-17)   Fracture of left lower leg   Gout   Hyperlipidemia   Hypertension   Morbid obesity (HCC)   Unstable gait  Past Surgical History: Past Surgical History: Procedure Laterality Date  IR CT HEAD LTD  06/05/2023  IR INTRAVSC STENT CERV CAROTID W/O EMB-PROT MOD SED INC ANGIO  06/05/2023  IR PERCUTANEOUS ART THROMBECTOMY/INFUSION INTRACRANIAL INC DIAG ANGIO  06/05/2023  IR US GUIDE VASC ACCESS RIGHT  06/05/2023  ORIF ANKLE FRACTURE Left 02/29/2016  Procedure: OPEN REDUCTION INTERNAL FIXATION (ORIF) ANKLE FRACTURE;  Surgeon: Yolonda Kida, MD;  Location: [facility];  Service: Orthopedics;  Laterality: Left;  PROSTATE SURGERY    RADIOLOGY WITH ANESTHESIA N/A 06/05/2023  Procedure: IR WITH ANESTHESIA;  Surgeon: Radiologist, Medication, MD;  Location: [facility];  Service: Radiology;  Laterality: N/A; Claudine Mouton 06/12/2023, 3:16 PM  DG Abd 1 View Result Date: 06/09/2023 CLINICAL DATA:  [id number]. Encounter for feeding tube placement. EXAM: ABDOMEN - 1 VIEW COMPARISON:  Abdomen film 06/05/2023. FINDINGS: Dobbhoff feeding tube is well placed with the radiopaque tip in the distal stomach. The visualized bowel pattern is nonobstructive. There is barium newly seen in the flexures and  transverse colon. There is no supine evidence of free air. There is atelectasis in the lung bases. Cardiomegaly. IMPRESSION: 1. Dobbhoff feeding tube well placed with the radiopaque tip in the distal stomach. 2. Nonobstructive bowel gas pattern. 3. Cardiomegaly. Electronically Signed   By: Almira Bar M.D.   On: 06/09/2023 03:41   DG CHEST PORT 1 VIEW Result Date: 06/08/2023 CLINICAL DATA:  CHF EXAM: PORTABLE CHEST 1 VIEW COMPARISON:  01/06/2023 FINDINGS: Feeding tube in place. There is cardiomegaly with vascular congestion. Chronic elevation of the right hemidiaphragm with right base atelectasis. Interstitial prominence throughout the lungs, right greater than left could reflect interstitial edema. Possible small effusions. No acute bony abnormality. IMPRESSION: Cardiomegaly with vascular congestion and probable mild interstitial edema. Question small bilateral effusions. Chronic elevation of the right hemidiaphragm with right base atelectasis. Electronically Signed   By: Charlett Nose M.D.   On: 06/08/2023 18:20   DG Swallowing Func-Speech Pathology Result Date: 06/07/2023 Table formatting from the original result was not included. Modified Barium Swallow Study Study completed and documented by [name],  SLP Student Supervised and reviewed by Harlon Ditty, MA CCC-SLP [facility] Secure Chat Preferred Office [phone] Patient Details Name: Enrigue Hashimi. MRN: [id number] Date of Birth: [birth year] Today's Date: 06/07/2023 HPI/PMH: HPI: Pt is a 83 y.o. male who was admitted as a code stroke on 02/05 due to acute onset of right-sided weakness, right facial droop and aphasia. MRI from 02/06 displayed an evolving acute left MCA infarct, most pronounced at the left insula and left frontal operculum. Pt has intentionally lost 300 pounds in the recent past, with pre-loss weight at ~600Ib. PMHx includes hypertension, diabetes, CKD, prior CVA. Clinical Impression: Clinical Impression: Pt  presented with a moderate-severe oropharyngeal dysphagia (DIGEST Score: 3) primarily characterized by silent aspiration of thin liquid, silent penetration of nectar-thick liquid, diffuse pharyngeal residue collection during thin/nectar-thick consistencies, and posterior-escape of bolus across multiple consistencies. Pt positioning was a potential limitation for examination due to consistent posterior head position. More anterior head positioning during nectar-thick liquid displayed less pharyngeal residue, increased hyo-laryngeal excursion, and overall better airway safety. This positioning was not present throughout due to pt's receptive language deficits to understand cues. Alternating between honey-thick and puree consistencies provided more oropharyngeal bolus clearance and no signs of aspiration. Recommend a dysphagia 1 (puree) and honey-thick liquid diet to optimize pt's airway safety and potential for meeting nutritional needs. Slow rate, small bites/sips, natural head posturing, and multiple swallows are compensatory strategies that displayed best efficiency with pt. F/u with SLP for family education, compensatory strategy training, and upgraded diet trials is recommended. Factors that may increase risk of adverse event in presence of aspiration [name] & Clearance [name] 2021): Factors that may increase risk of adverse event in presence of aspiration [name] & Clearance [name] 2021): Weak cough Recommendations/Plan: Swallowing Evaluation Recommendations Swallowing Evaluation Recommendations Recommendations: PO diet PO Diet Recommendation: Dysphagia 1 (Pureed); Moderately thick liquids (Level 3, honey thick) Liquid Administration via: Spoon Medication Administration: Crushed with puree Supervision: Full supervision/cueing for swallowing strategies; Full assist for feeding Swallowing strategies  : Slow rate; Small bites/sips; Multiple dry swallows after each bite/sip (Pt needs to be in a neutral head position (no posterior  head tilt).) Postural changes: Position pt fully upright for meals; Stay upright 30-60 min after meals Oral care recommendations: Oral care BID (2x/day); Staff/trained caregiver to provide oral care Caregiver Recommendations: Have oral suction available Treatment Plan Treatment Plan Treatment recommendations: Therapy as outlined in treatment plan below Follow-up recommendations: Follow physicians's recommendations for discharge plan and follow up therapies Functional status assessment: Patient has had a recent decline in their functional status and demonstrates the ability to make significant improvements in function in a reasonable and predictable amount of time. Treatment frequency: Min 2x/week Treatment duration: 2 weeks Interventions: Aspiration precaution training; Compensatory techniques; Patient/family education; Diet toleration management by SLP; Trials of upgraded texture/liquids Recommendations Recommendations for follow up therapy are one component of a multi-disciplinary discharge planning process, led by the attending physician.  Recommendations may be updated based on patient status, additional functional criteria and insurance authorization. Assessment: Orofacial Exam: Orofacial Exam Oral Cavity - Dentition: Adequate natural dentition Orofacial Anatomy: WFL Anatomy: Anatomy: WFL Boluses Administered: Boluses Administered Boluses Administered: Thin liquids (Level 0); Mildly thick liquids (Level 2, nectar thick); Moderately thick liquids (Level 3, honey thick); Puree; Solid  Oral Impairment Domain: Oral Impairment Domain Lip Closure: Escape progressing to mid-chin Tongue control during bolus hold: Posterior escape of greater than half of bolus Bolus preparation/mastication: -- (Pt did not chew solid and SLP had to  remove it from mouth.) Bolus transport/lingual motion: Repetitive/disorganized tongue motion Oral residue: Residue collection on oral structures Location of oral residue : Tongue; Palate  Initiation of pharyngeal swallow : Pyriform sinuses  Pharyngeal Impairment Domain: Pharyngeal Impairment Domain Soft palate elevation: No bolus between soft palate (SP)/pharyngeal wall (PW) Laryngeal elevation: Partial superior movement of thyroid cartilage/partial approximation of arytenoids to epiglottic petiole Anterior hyoid excursion: Partial anterior movement Epiglottic movement: Partial inversion Laryngeal vestibule closure: Incomplete, narrow column air/contrast in laryngeal vestibule Pharyngeal stripping wave : Present - complete Pharyngeal contraction (A/P view only): N/A Pharyngoesophageal segment opening: Complete distension and complete duration, no obstruction of flow Tongue base retraction: Narrow column of contrast or air between tongue base and PPW Pharyngeal residue: Collection of residue within or on pharyngeal structures Location of pharyngeal residue: Diffuse (>3 areas)  Esophageal Impairment Domain: Esophageal Impairment Domain Esophageal clearance upright position: -- (Not tested.) Pill: No data recorded Penetration/Aspiration Scale Score: Penetration/Aspiration Scale Score 1.  Material does not enter airway: Puree; Moderately thick liquids (Level 3, honey thick) 3.  Material enters airway, remains ABOVE vocal cords and not ejected out: Mildly thick liquids (Level 2, nectar thick) 8.  Material enters airway, passes BELOW cords without attempt by patient to eject out (silent aspiration) : Thin liquids (Level 0) Compensatory Strategies: Compensatory Strategies Compensatory strategies: Yes Straw: Ineffective Multiple swallows: Effective (Required max cueing due to pt's language.) Effective Multiple Swallows: Puree; Moderately thick liquid (Level 3, honey thick); Mildly thick liquid (Level 2, nectar thick)   General Information: Caregiver present: No  Diet Prior to this Study: NPO   Temperature : Normal   Respiratory Status: WFL   Supplemental O2: None (Room air)   No data recorded  Behavior/Cognition: Alert; Cooperative; Pleasant mood; Requires cueing Self-Feeding Abilities: Needs assist with self-feeding Baseline vocal quality/speech: Dysphonic Volitional Cough: Unable to elicit Volitional Swallow: Able to elicit Exam Limitations: Poor positioning Goal Planning: Prognosis for improved oropharyngeal function: Good Barriers to Reach Goals: Language deficits No data recorded No data recorded Consulted and agree with results and recommendations: Pt unable/family or caregiver not available Pain: Pain Assessment Pain Assessment: No/denies pain Faces Pain Scale: 0 Facial Expression: 0 Body Movements: 0 Muscle Tension: 0 Compliance with ventilator (intubated pts.): N/A Vocalization (extubated pts.): 0 CPOT Total: 0 End of Session: Start Time:SLP Start Time (ACUTE ONLY): 0903 Stop Time: SLP Stop Time (ACUTE ONLY): 0930 Time Calculation:SLP Time Calculation (min) (ACUTE ONLY): 27 min Charges: SLP Evaluations $ SLP Speech Visit: 1 Visit SLP Evaluations $BSS Swallow: 1 Procedure $MBS Swallow: 1 Procedure $ SLP EVAL LANGUAGE/SOUND PRODUCTION: 1 Procedure $Swallowing Treatment: 1 Procedure SLP visit diagnosis: SLP Visit Diagnosis: Dysphagia, oropharyngeal phase (R13.12) Past Medical History: Past Medical History: Diagnosis Date  Anemia of chronic disease   Ankle fracture 02/15/2016  Cancer (HCC)   Prostate  Chronic constipation   Chronic kidney disease   stage III  Diabetes mellitus without complication (HCC)   type II   Diabetic peripheral neuropathy (HCC)   Failure to thrive (0-17)   Fracture of left lower leg   Gout   Hyperlipidemia   Hypertension   Morbid obesity (HCC)   Unstable gait  Past Surgical History: Past Surgical History: Procedure Laterality Date  IR CT HEAD LTD  06/05/2023  IR INTRAVSC STENT CERV CAROTID W/O EMB-PROT MOD SED INC ANGIO  06/05/2023  IR PERCUTANEOUS ART THROMBECTOMY/INFUSION INTRACRANIAL INC DIAG ANGIO  06/05/2023  IR US GUIDE VASC ACCESS RIGHT  06/05/2023  ORIF ANKLE FRACTURE Left  02/29/2016  Procedure: OPEN REDUCTION INTERNAL FIXATION (ORIF) ANKLE FRACTURE;  Surgeon: Yolonda Kida, MD;  Location: [facility];  Service: Orthopedics;  Laterality: Left;  PROSTATE SURGERY    RADIOLOGY WITH ANESTHESIA N/A 06/05/2023  Procedure: IR WITH ANESTHESIA;  Surgeon: Radiologist, Medication, MD;  Location: [facility];  Service: Radiology;  Laterality: N/A; [place], Riley Nearing 06/07/2023, 12:15 PM  CT HEAD WO CONTRAST ( ) Result Date: 06/07/2023 CLINICAL DATA:  83 year old male status post code stroke presentation, left MCA M2 occlusion, with left MCA infarct with confluent petechial hemorrhage. EXAM: CT HEAD WITHOUT CONTRAST TECHNIQUE: Contiguous axial images were obtained from the base of the skull through the vertex without intravenous contrast. RADIATION DOSE REDUCTION: This exam was performed according to the departmental dose-optimization program which includes automated exposure control, adjustment of the mA and/or kV according to patient size and/or use of iterative reconstruction technique. COMPARISON:  Brain MRI yesterday.  Head CT 06/05/2023. FINDINGS: Brain: Confluent mixed density infarct in the left MCA middle division, epicenter at the insula and operculum. Confluent petechial hemorrhage on series 3, image 16 appears stable from the MRI. Superimposed trace subarachnoid blood is difficult to exclude by CT (series 3, image 13), but was not apparent on MRI. Stable mild mass effect on the left lateral ventricle with only trace rightward midline shift (series 3, image 16). No ventriculomegaly. Elsewhere gray-white differentiation is stable from the presentation CT. Basilar cisterns remain patent. Vascular: Resolved hyperdense left MCA since presentation. Calcified atherosclerosis at the skull base. Skull: Stable, intact. Sinuses/Orbits: Visualized paranasal sinuses and mastoids are stable and well aerated. Other: Left nasoenteric tube now in place. Stable orbit and scalp soft tissues. IMPRESSION:  1. Stable by CT confluent Left MCA middle division infarct with petechial hemorrhage (Heidelberg classification 1b: HI2, confluent petechiae, no mass effect). Questionable trace superimposed SAH, but was not apparent on MRI. 2. Stable minimal intracranial mass effect. 3. No new intracranial abnormality. Electronically Signed   By: Odessa Fleming M.D.   On: 06/07/2023 05:37   MR BRAIN WO CONTRAST Result Date: 06/06/2023 CLINICAL DATA:  Follow-up examination for stroke. EXAM: MRI HEAD WITHOUT CONTRAST TECHNIQUE: Multiplanar, multiecho pulse sequences of the brain and surrounding structures were obtained without intravenous contrast. COMPARISON:  Comparison made to multiple previous exams from 06/05/2023. FINDINGS: Brain: Examination degraded by motion artifact. Cerebral volume within normal limits for age. Patchy T2/FLAIR hyperintensity involving the periventricular deep white matter both cerebral hemispheres, consistent with chronic small vessel ischemic disease, mild in nature. Small remote infarct noted within the right cerebellum. Confluent restricted diffusion involving the left insula and left frontal operculum, consistent with evolving acute left MCA distribution infarct. Area of infarction measures up to approximately 6 cm in AP diameter. Prominent associated susceptibility artifact, consistent with petechial hemorrhage (series 7, image 61) ( Heidelberg classification 1b: HI2, confluent petechiae, no mass effect. No frank or organized hematoma evident by MRI. No significant regional mass effect. Few additional small foci of infarction noted within the left parieto-occipital region posteriorly (series 3, images 40, 29). Apparent diffusion signal along the right frontal parafalcine region felt to be consistent with artifact related to dural calcification. Note made of a few additional chronic micro hemorrhages within the left cerebellum and right thalamus. No mass lesion or midline shift. Ventricles normal size  without hydrocephalus. No extra-axial fluid collection. Pituitary gland suprasellar region within normal limits. Vascular: Major intracranial vascular flow voids are maintained. Skull and upper cervical spine: Craniocervical junction within normal limits. Bone marrow signal intensity overall within normal  limits. No scalp soft tissue abnormality. Sinuses/Orbits: Prior bilateral ocular lens replacement. Paranasal sinuses are clear. No mastoid effusion. Other: None. IMPRESSION: 1. Evolving acute left MCA distribution infarct, most pronounced at the left insula and left frontal operculum. Prominent associated petechial hemorrhage without frank intraparenchymal hematoma (Heidelberg classification 1b: HI2, confluent petechiae, no mass effect). 2. Few additional small foci of acute infarction within the left parieto-occipital region posteriorly. 3. Underlying mild chronic microvascular ischemic disease with small remote right cerebellar infarct. Electronically Signed   By: Rise Mu M.D.   On: 06/06/2023 03:12   ECHOCARDIOGRAM COMPLETE Result Date: 06/05/2023    ECHOCARDIOGRAM REPORT   Patient Name:   Storm Sovine. Date of Exam: 06/05/2023 Medical Rec #:  [id number]              Height:       70.0 in Accession #:    [id number]             Weight:       292.3 lb Date of Birth:  Jul 27, 1940              BSA:          2.453 m Patient Age:    82 years               BP:           129/72 mmHg Patient Gender: M                      HR:           89 bpm. Exam Location:  Inpatient Procedure: 2D Echo, Color Doppler, Cardiac Doppler and Intracardiac            Opacification Agent Indications:    Stroke I63.9  History:        Patient has prior history of Echocardiogram examinations, most                 recent 10/06/2019. Risk Factors:Diabetes, Hypertension and                 Dyslipidemia.  Sonographer:    Harriette Bouillon RDCS Referring Phys: Lynnae January IMPRESSIONS  1. Left ventricular ejection fraction, by  estimation, is 30%. The left ventricle has moderately decreased function. The left ventricle demonstrates global hypokinesis. There is mild left ventricular hypertrophy.  2. Right ventricular systolic function is normal. The right ventricular size is normal.  3. Trivial mitral valve regurgitation.  4. The aortic valve is tricuspid. Aortic valve regurgitation is not visualized.  5. The inferior vena cava is normal in size with greater than 50% respiratory variability, suggesting right atrial pressure of 3 mmHg. Comparison(s): The left ventricular function is worsened. FINDINGS  Left Ventricle: Left ventricular ejection fraction, by estimation, is 30%. The left ventricle has moderately decreased function. The left ventricle demonstrates global hypokinesis. Definity contrast agent was given IV to delineate the left ventricular endocardial borders. The left ventricular internal cavity size was normal in size. There is mild left ventricular hypertrophy. Right Ventricle: The right ventricular size is normal. Right vetricular wall thickness was not assessed. Right ventricular systolic function is normal. Left Atrium: Left atrial size was normal in size. Right Atrium: Right atrial size was normal in size. Pericardium: There is no evidence of pericardial effusion. Mitral Valve: There is mild thickening of the mitral valve leaflet(s). Mild to moderate mitral annular calcification. Trivial mitral valve regurgitation. Tricuspid Valve: The tricuspid valve  is normal in structure. Tricuspid valve regurgitation is trivial. Aortic Valve: The aortic valve is tricuspid. Aortic valve regurgitation is not visualized. Pulmonic Valve: The pulmonic valve was normal in structure. Pulmonic valve regurgitation is not visualized. Aorta: The aortic root and ascending aorta are structurally normal, with no evidence of dilitation. Venous: The inferior vena cava is normal in size with greater than 50% respiratory variability, suggesting right  atrial pressure of 3 mmHg. IAS/Shunts: The interatrial septum was not assessed.  LEFT VENTRICLE PLAX 2D LVIDd:         5.00 cm   Diastology LVIDs:         4.40 cm   LV e' lateral: 5.33 cm/s LV PW:         1.20 cm LV IVS:        1.10 cm LVOT diam:     2.30 cm LV SV:         55 LV SV Index:   23 LVOT Area:     4.15 cm  RIGHT VENTRICLE RV S prime:     14.40 cm/s LEFT ATRIUM         Index LA diam:    4.60 cm 1.88 cm/m  AORTIC VALVE LVOT Vmax:   65.50 cm/s LVOT Vmean:  45.200 cm/s LVOT VTI:    0.133 m  AORTA Ao Root diam: 3.00 cm Ao Asc diam:  3.50 cm  SHUNTS Systemic VTI:  0.13 m Systemic Diam: 2.30 cm Dietrich Pates MD Electronically signed by Dietrich Pates MD Signature Date/Time: 06/05/2023/9:33:38 PM    Final    DG Abd Portable 1V Result Date: 06/05/2023 CLINICAL DATA:  Feeding tube placement. EXAM: PORTABLE ABDOMEN - 1 VIEW COMPARISON:  None Available. FINDINGS: Distal tip of feeding tube is seen in expected position of distal stomach. IMPRESSION: Distal tip of feeding tube seen in expected position of distal stomach. Electronically Signed   By: Lupita Raider M.D.   On: 06/05/2023 15:49   CT HEAD WO CONTRAST Result Date: 06/05/2023 CLINICAL DATA:  Stroke, follow-up. Status post intracranial mechanical thrombectomy and stenting of the left ICA bifurcation. EXAM: CT HEAD WITHOUT CONTRAST TECHNIQUE: Contiguous axial images were obtained from the base of the skull through the vertex without intravenous contrast. RADIATION DOSE REDUCTION: This exam was performed according to the departmental dose-optimization program which includes automated exposure control, adjustment of the mA and/or kV according to patient size and/or use of iterative reconstruction technique. COMPARISON:  CT head without contrast and CT angio head and neck 06/05/2023. By plain CT 06/05/2023. FINDINGS: Brain: The study is mildly degraded by patient motion. The left insular and left opercular infarct is somewhat obscured by patient motion. The cortex  is slightly hyperdense, potentially reflecting reperfusion. No hemorrhage is present. Basal ganglia are intact. Subcortical white matter hypoattenuation in the high left frontal lobe is new. Mild right-sided white matter disease is stable. Basal ganglia are intact. A remote lacunar infarct is again noted in the right cerebellum. The brainstem and cerebellum are otherwise within normal limits. Vascular: Atherosclerotic calcifications are present within the cavernous internal carotid arteries and at the normal origin of both vertebral arteries. No hyperdense vessel is present. Skull: Calvarium is intact. No focal lytic or blastic lesions are present. No significant extracranial soft tissue lesion is present. Sinuses/Orbits: The paranasal sinuses and mastoid air cells are clear. Bilateral lens replacements are noted. Globes and orbits are otherwise unremarkable. IMPRESSION: 1. The left insular and left opercular infarct is somewhat obscured by patient  motion. 2. The cortex is slightly hyperdense, potentially reflecting reperfusion. 3. No hemorrhage. 4. Subcortical white matter hypoattenuation in the high left frontal lobe is new. This may reflect ischemic changes or edema related to the reperfusion. 5. Remote lacunar infarct of the right cerebellum. 6. Stable mild right-sided white matter disease. This likely reflects the sequela of chronic microvascular ischemia. Electronically Signed   By: Marin Roberts M.D.   On: 06/05/2023 14:38   IR PERCUTANEOUS ART THROMBECTOMY/INFUSION INTRACRANIAL INC DIAG ANGIO Result Date: 06/05/2023 INDICATION: 83 year old male presenting with right-sided weakness and aphasia; NIHSS 28. His last known well was 10 p.m. on 06/04/2023. His past medical history significant for prior stroke, hypertension, diabetes and chronic kidney disease; baseline modified Rankin scale 0. Head CT showed hypodensity within the left insula, basal ganglia and left frontal operculum (ASPECTS 7). No IV  thrombolytic given as patient was outside the window. CT angiogram of the head and neck showed an occlusion of a left M2/MCA anterior division branch. CT perfusion showed a 41 mL core infarct with a 45 mL ischemic penumbra. She was transferred to our service for mechanical thrombectomy. EXAM: ULTRASOUND-GUIDED VASCULAR ACCESS DIAGNOSTIC CEREBRAL ANGIOGRAM MECHANICAL THROMBECTOMY FLAT PANEL HEAD CT LEFT CAROTID STENTING AND ANGIOPLASTY WITHOUT CEREBRAL PROTECTION DEVICE COMPARISON:  CT/CT angiogram of the head and neck June 05, 2023. MEDICATIONS: No antibiotics administered. ANESTHESIA/SEDATION: The procedure was performed under general anesthesia. CONTRAST:  80 mL of Omnipaque 300 milligram/mL FLUOROSCOPY: Radiation Exposure Index (as provided by the fluoroscopic device): 1315 mGy Kerma COMPLICATIONS: None immediate. TECHNIQUE: Informed written consent was obtained from the patient's wife after a thorough discussion of the procedural risks, benefits and alternatives. All questions were addressed. Maximal Sterile Barrier Technique was utilized including caps, mask, sterile gowns, sterile gloves, sterile drape, hand hygiene and skin antiseptic. A timeout was performed prior to the initiation of the procedure. The right groin was prepped and draped in the usual sterile fashion. Using a micropuncture kit and the modified Seldinger technique, access was gained to the right common femoral artery and an 8 French sheath was placed. Real-time ultrasound guidance was utilized for vascular access including the acquisition of a permanent ultrasound image documenting patency of the accessed vessel. Under fluoroscopy, an 8 [place] Walrus balloon guide catheter was navigated over a 6 [place] VTK catheter and a 0.035" Terumo Glidewire into the aortic arch. The catheter was placed into the left common carotid artery and then advanced into the left internal carotid artery. The diagnostic catheter was removed. Frontal and lateral  angiograms of the head were obtained. FINDINGS: 1. Ultrasound showed heavily calcified right common femoral artery is maintained patency and caliber. 2. Proximal occlusion of a left M2/MCA anterior division branch. 3. Atherosclerotic changes of the intracranial left ICA with mild stenosis at the distal cavernous segment. 4. 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 PROCEDURE: Using biplane roadmap guidance, a Red 62 aspiration catheter was navigated over Colossus 35 microguidewire into the cavernous segment of the left ICA. The aspiration catheter was then advanced to the level of occlusion and connected to an aspiration pump. Continuous aspiration was performed for 2 minutes. The guide catheter was connected to a VacLok syringe and the guiding catheter balloon was inflated. The aspiration catheter was subsequently removed under constant aspiration. The guide catheter was aspirated for debris. Left internal carotid artery angiograms with frontal and lateral views of the head showed complete recanalization  of the left MCA vascular tree. The guide catheter was retracted into the neck. Frontal and lateral angiograms of the neck were obtained. Improvement of the degree of stenosis in the carotid bulb compared to prior CT angiogram. There is prominent luminal irregularity at the carotid bulb residual moderate stenosis. Increased tortuosity of the proximal/mid cervical left ICA with kinking. Left internal carotid artery angiograms with left anterior oblique views of the neck showed evidence of filling defects at the level of the carotid bulb. Flat panel CT of the head was obtained and post processed in a separate workstation with concurrent attending physician supervision. Selected images were sent to PACS. No evidence of hemorrhagic complication. There is mild contrast staining of the left insular and frontal  cortex. Repeat left internal carotid artery angiograms with frontal and lateral views of the head showed Amy seen left M3/MCA branch to the left parietal region. Left common carotid artery angiograms with frontal and lateral views of the neck showed vertebra Gretchen of stenosis at the left ICA bulb with more prominent filling defect. At this point, patient was loaded on cangrelor followed by continuous drip. Using biplane roadmap guidance, a 4-7 mm Emboshield NAV6 cerebral protection device was advanced into the cervical left ICA. However, multiple attempts to advance a cerebral protection device through the left ICA kinking proved unsuccessful. The cerebral protection device was subsequently removed. Using biplane roadmap guidance, a 10-8 x 40 mm XACT carotid stent was navigated and deployed from the distal left common carotid artery to the proximal left internal carotid artery, proximal to the vessel kinking. Suboptimal stent expansion was noted. Then, a 6 x 30 mm Viatrac balloon was navigated into the recently deployed stent. Angioplasty was performed under fluoroscopy. Left internal carotid artery angiograms with frontal and lateral views of the neck showed adequate stent positioning and expansion with brisk anterograde flow. Left internal carotid artery angiograms with frontal and lateral views of the head showed improvement of anterograde flow in the left MCA vascular tree with brisk anterograde flow. Delayed left common carotid artery angiograms with frontal and lateral views of the neck showed no evidence of clot formation within the stent. The catheter was subsequently Riddle. Right common femoral artery angiogram was obtained in right anterior oblique view. The puncture is at the level of the common femoral artery. The artery has normal caliber, adequate for closure device. The sheath was exchanged over the wire for an 8 [place] Angio-Seal which was utilized for access closure. Immediate hemostasis was  achieved. IMPRESSION: 1. Successful mechanical thrombectomy for treatment of a proximal left M2/MCA anterior division branch occlusion achieving complete recanalization (TICI 3). 2. 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 PLAN: Continue cangrelor infusion until patient is transitioned to oral dual antiplatelet therapy. Electronically Signed   By: Baldemar Lenis M.D.   On: 06/05/2023 13:16   IR US Guide Vasc Access Right Result Date: 06/05/2023 INDICATION: 83 year old male presenting with right-sided weakness and aphasia; NIHSS 28. His last known well was 10 p.m. on 06/04/2023. His past medical history significant for prior stroke, hypertension, diabetes and chronic kidney disease; baseline modified Rankin scale 0. Head CT showed hypodensity within the left insula, basal ganglia and left frontal operculum (ASPECTS 7). No IV thrombolytic given as patient was outside the window. CT angiogram of the head and neck showed an occlusion of a left M2/MCA anterior division branch. CT  perfusion showed a 41 mL core infarct with a 45 mL ischemic penumbra. She was transferred to our service for mechanical thrombectomy. EXAM: ULTRASOUND-GUIDED VASCULAR ACCESS DIAGNOSTIC CEREBRAL ANGIOGRAM MECHANICAL THROMBECTOMY FLAT PANEL HEAD CT LEFT CAROTID STENTING AND ANGIOPLASTY WITHOUT CEREBRAL PROTECTION DEVICE COMPARISON:  CT/CT angiogram of the head and neck June 05, 2023. MEDICATIONS: No antibiotics administered. ANESTHESIA/SEDATION: The procedure was performed under general anesthesia. CONTRAST:  80 mL of Omnipaque 300 milligram/mL FLUOROSCOPY: Radiation Exposure Index (as provided by the fluoroscopic device): 1315 mGy Kerma COMPLICATIONS: None immediate. TECHNIQUE: Informed written consent was obtained from the patient's wife after a thorough discussion of the  procedural risks, benefits and alternatives. All questions were addressed. Maximal Sterile Barrier Technique was utilized including caps, mask, sterile gowns, sterile gloves, sterile drape, hand hygiene and skin antiseptic. A timeout was performed prior to the initiation of the procedure. The right groin was prepped and draped in the usual sterile fashion. Using a micropuncture kit and the modified Seldinger technique, access was gained to the right common femoral artery and an 8 French sheath was placed. Real-time ultrasound guidance was utilized for vascular access including the acquisition of a permanent ultrasound image documenting patency of the accessed vessel. Under fluoroscopy, an 8 [place] Walrus balloon guide catheter was navigated over a 6 [place] VTK catheter and a 0.035" Terumo Glidewire into the aortic arch. The catheter was placed into the left common carotid artery and then advanced into the left internal carotid artery. The diagnostic catheter was removed. Frontal and lateral angiograms of the head were obtained. FINDINGS: 1. Ultrasound showed heavily calcified right common femoral artery is maintained patency and caliber. 2. Proximal occlusion of a left M2/MCA anterior division branch. 3. Atherosclerotic changes of the intracranial left ICA with mild stenosis at the distal cavernous segment. 4. 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 PROCEDURE: Using biplane roadmap guidance, a Red 62 aspiration catheter was navigated over Colossus 35 microguidewire into the cavernous segment of the left ICA. The aspiration catheter was then advanced to the level of occlusion and connected to an aspiration pump. Continuous aspiration was performed for 2 minutes. The guide catheter was connected to a VacLok syringe and the guiding catheter balloon was inflated. The aspiration catheter was subsequently removed  under constant aspiration. The guide catheter was aspirated for debris. Left internal carotid artery angiograms with frontal and lateral views of the head showed complete recanalization of the left MCA vascular tree. The guide catheter was retracted into the neck. Frontal and lateral angiograms of the neck were obtained. Improvement of the degree of stenosis in the carotid bulb compared to prior CT angiogram. There is prominent luminal irregularity at the carotid bulb residual moderate stenosis. Increased tortuosity of the proximal/mid cervical left ICA with kinking. Left internal carotid artery angiograms with left anterior oblique views of the neck showed evidence of filling defects at the level of the carotid bulb. Flat panel CT of the head was obtained and post processed in a separate workstation with concurrent attending physician supervision. Selected images were sent to PACS. No evidence of hemorrhagic complication. There is mild contrast staining of the left insular and frontal cortex. Repeat left internal carotid artery angiograms with frontal and lateral views of the head showed Amy seen left M3/MCA branch to the left parietal region. Left common carotid artery angiograms with frontal and lateral views of the neck showed vertebra Gretchen of stenosis  at the left ICA bulb with more prominent filling defect. At this point, patient was loaded on cangrelor followed by continuous drip. Using biplane roadmap guidance, a 4-7 mm Emboshield NAV6 cerebral protection device was advanced into the cervical left ICA. However, multiple attempts to advance a cerebral protection device through the left ICA kinking proved unsuccessful. The cerebral protection device was subsequently removed. Using biplane roadmap guidance, a 10-8 x 40 mm XACT carotid stent was navigated and deployed from the distal left common carotid artery to the proximal left internal carotid artery, proximal to the vessel kinking. Suboptimal stent  expansion was noted. Then, a 6 x 30 mm Viatrac balloon was navigated into the recently deployed stent. Angioplasty was performed under fluoroscopy. Left internal carotid artery angiograms with frontal and lateral views of the neck showed adequate stent positioning and expansion with brisk anterograde flow. Left internal carotid artery angiograms with frontal and lateral views of the head showed improvement of anterograde flow in the left MCA vascular tree with brisk anterograde flow. Delayed left common carotid artery angiograms with frontal and lateral views of the neck showed no evidence of clot formation within the stent. The catheter was subsequently Riddle. Right common femoral artery angiogram was obtained in right anterior oblique view. The puncture is at the level of the common femoral artery. The artery has normal caliber, adequate for closure device. The sheath was exchanged over the wire for an 8 [place] Angio-Seal which was utilized for access closure. Immediate hemostasis was achieved. IMPRESSION: 1. Successful mechanical thrombectomy for treatment of a proximal left M2/MCA anterior division branch occlusion achieving complete recanalization (TICI 3). 2. 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 PLAN: Continue cangrelor infusion until patient is transitioned to oral dual antiplatelet therapy. Electronically Signed   By: Baldemar Lenis M.D.   On: 06/05/2023 13:16   IR INTRAVSC STENT CERV CAROTID W/O EMB-PROT MOD SED Result Date: 06/05/2023 INDICATION: 83 year old male presenting with right-sided weakness and aphasia; NIHSS 28. His last known well was 10 p.m. on 06/04/2023. His past medical history significant for prior stroke, hypertension, diabetes and chronic kidney disease; baseline modified Rankin scale 0. Head CT showed hypodensity within  the left insula, basal ganglia and left frontal operculum (ASPECTS 7). No IV thrombolytic given as patient was outside the window. CT angiogram of the head and neck showed an occlusion of a left M2/MCA anterior division branch. CT perfusion showed a 41 mL core infarct with a 45 mL ischemic penumbra. She was transferred to our service for mechanical thrombectomy. EXAM: ULTRASOUND-GUIDED VASCULAR ACCESS DIAGNOSTIC CEREBRAL ANGIOGRAM MECHANICAL THROMBECTOMY FLAT PANEL HEAD CT LEFT CAROTID STENTING AND ANGIOPLASTY WITHOUT CEREBRAL PROTECTION DEVICE COMPARISON:  CT/CT angiogram of the head and neck June 05, 2023. MEDICATIONS: No antibiotics administered. ANESTHESIA/SEDATION: The procedure was performed under general anesthesia. CONTRAST:  80 mL of Omnipaque 300 milligram/mL FLUOROSCOPY: Radiation Exposure Index (as provided by the fluoroscopic device): 1315 mGy Kerma COMPLICATIONS: None immediate. TECHNIQUE: Informed written consent was obtained from the patient's wife after a thorough discussion of the procedural risks, benefits and alternatives. All questions were addressed. Maximal Sterile Barrier Technique was utilized including caps, mask, sterile gowns, sterile gloves, sterile drape, hand hygiene and skin antiseptic. A timeout was performed prior to the initiation of the procedure. The right groin was prepped and draped in the usual sterile fashion. Using a micropuncture kit and the modified Seldinger  technique, access was gained to the right common femoral artery and an 8 French sheath was placed. Real-time ultrasound guidance was utilized for vascular access including the acquisition of a permanent ultrasound image documenting patency of the accessed vessel. Under fluoroscopy, an 8 [place] Walrus balloon guide catheter was navigated over a 6 [place] VTK catheter and a 0.035" Terumo Glidewire into the aortic arch. The catheter was placed into the left common carotid artery and then advanced into the left internal  carotid artery. The diagnostic catheter was removed. Frontal and lateral angiograms of the head were obtained. FINDINGS: 1. Ultrasound showed heavily calcified right common femoral artery is maintained patency and caliber. 2. Proximal occlusion of a left M2/MCA anterior division branch. 3. Atherosclerotic changes of the intracranial left ICA with mild stenosis at the distal cavernous segment. 4. 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 PROCEDURE: Using biplane roadmap guidance, a Red 62 aspiration catheter was navigated over Colossus 35 microguidewire into the cavernous segment of the left ICA. The aspiration catheter was then advanced to the level of occlusion and connected to an aspiration pump. Continuous aspiration was performed for 2 minutes. The guide catheter was connected to a VacLok syringe and the guiding catheter balloon was inflated. The aspiration catheter was subsequently removed under constant aspiration. The guide catheter was aspirated for debris. Left internal carotid artery angiograms with frontal and lateral views of the head showed complete recanalization of the left MCA vascular tree. The guide catheter was retracted into the neck. Frontal and lateral angiograms of the neck were obtained. Improvement of the degree of stenosis in the carotid bulb compared to prior CT angiogram. There is prominent luminal irregularity at the carotid bulb residual moderate stenosis. Increased tortuosity of the proximal/mid cervical left ICA with kinking. Left internal carotid artery angiograms with left anterior oblique views of the neck showed evidence of filling defects at the level of the carotid bulb. Flat panel CT of the head was obtained and post processed in a separate workstation with concurrent attending physician supervision. Selected images were sent to PACS. No evidence of hemorrhagic  complication. There is mild contrast staining of the left insular and frontal cortex. Repeat left internal carotid artery angiograms with frontal and lateral views of the head showed Amy seen left M3/MCA branch to the left parietal region. Left common carotid artery angiograms with frontal and lateral views of the neck showed vertebra Gretchen of stenosis at the left ICA bulb with more prominent filling defect. At this point, patient was loaded on cangrelor followed by continuous drip. Using biplane roadmap guidance, a 4-7 mm Emboshield NAV6 cerebral protection device was advanced into the cervical left ICA. However, multiple attempts to advance a cerebral protection device through the left ICA kinking proved unsuccessful. The cerebral protection device was subsequently removed. Using biplane roadmap guidance, a 10-8 x 40 mm XACT carotid stent was navigated and deployed from the distal left common carotid artery to the proximal left internal carotid artery, proximal to the vessel kinking. Suboptimal stent expansion was noted. Then, a 6 x 30 mm Viatrac balloon was navigated into the recently deployed stent. Angioplasty was performed under fluoroscopy. Left internal carotid artery angiograms with frontal and lateral views of the neck showed adequate stent positioning and expansion with brisk anterograde flow. Left internal carotid artery angiograms with frontal and lateral views of the head showed improvement of anterograde flow in the left  MCA vascular tree with brisk anterograde flow. Delayed left common carotid artery angiograms with frontal and lateral views of the neck showed no evidence of clot formation within the stent. The catheter was subsequently Riddle. Right common femoral artery angiogram was obtained in right anterior oblique view. The puncture is at the level of the common femoral artery. The artery has normal caliber, adequate for closure device. The sheath was exchanged over the wire for an 8 [place]  Angio-Seal which was utilized for access closure. Immediate hemostasis was achieved. IMPRESSION: 1. Successful mechanical thrombectomy for treatment of a proximal left M2/MCA anterior division branch occlusion achieving complete recanalization (TICI 3). 2. 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 PLAN: Continue cangrelor infusion until patient is transitioned to oral dual antiplatelet therapy. Electronically Signed   By: Baldemar Lenis M.D.   On: 06/05/2023 13:16   IR CT Head Ltd Result Date: 06/05/2023 INDICATION: 83 year old male presenting with right-sided weakness and aphasia; NIHSS 28. His last known well was 10 p.m. on 06/04/2023. His past medical history significant for prior stroke, hypertension, diabetes and chronic kidney disease; baseline modified Rankin scale 0. Head CT showed hypodensity within the left insula, basal ganglia and left frontal operculum (ASPECTS 7). No IV thrombolytic given as patient was outside the window. CT angiogram of the head and neck showed an occlusion of a left M2/MCA anterior division branch. CT perfusion showed a 41 mL core infarct with a 45 mL ischemic penumbra. She was transferred to our service for mechanical thrombectomy. EXAM: ULTRASOUND-GUIDED VASCULAR ACCESS DIAGNOSTIC CEREBRAL ANGIOGRAM MECHANICAL THROMBECTOMY FLAT PANEL HEAD CT LEFT CAROTID STENTING AND ANGIOPLASTY WITHOUT CEREBRAL PROTECTION DEVICE COMPARISON:  CT/CT angiogram of the head and neck June 05, 2023. MEDICATIONS: No antibiotics administered. ANESTHESIA/SEDATION: The procedure was performed under general anesthesia. CONTRAST:  80 mL of Omnipaque 300 milligram/mL FLUOROSCOPY: Radiation Exposure Index (as provided by the fluoroscopic device): 1315 mGy Kerma COMPLICATIONS: None immediate. TECHNIQUE: Informed written consent was obtained from the  patient's wife after a thorough discussion of the procedural risks, benefits and alternatives. All questions were addressed. Maximal Sterile Barrier Technique was utilized including caps, mask, sterile gowns, sterile gloves, sterile drape, hand hygiene and skin antiseptic. A timeout was performed prior to the initiation of the procedure. The right groin was prepped and draped in the usual sterile fashion. Using a micropuncture kit and the modified Seldinger technique, access was gained to the right common femoral artery and an 8 French sheath was placed. Real-time ultrasound guidance was utilized for vascular access including the acquisition of a permanent ultrasound image documenting patency of the accessed vessel. Under fluoroscopy, an 8 [place] Walrus balloon guide catheter was navigated over a 6 [place] VTK catheter and a 0.035" Terumo Glidewire into the aortic arch. The catheter was placed into the left common carotid artery and then advanced into the left internal carotid artery. The diagnostic catheter was removed. Frontal and lateral angiograms of the head were obtained. FINDINGS: 1. Ultrasound showed heavily calcified right common femoral artery is maintained patency and caliber. 2. Proximal occlusion of a left M2/MCA anterior division branch. 3. Atherosclerotic changes of the intracranial left ICA with mild stenosis at the distal cavernous segment. 4. 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 PROCEDURE: Using biplane roadmap guidance, a Red 62 aspiration catheter  was navigated over Colossus 35 microguidewire into the cavernous segment of the left ICA. The aspiration catheter was then advanced to the level of occlusion and connected to an aspiration pump. Continuous aspiration was performed for 2 minutes. The guide catheter was connected to a VacLok syringe and the guiding catheter balloon was inflated.  The aspiration catheter was subsequently removed under constant aspiration. The guide catheter was aspirated for debris. Left internal carotid artery angiograms with frontal and lateral views of the head showed complete recanalization of the left MCA vascular tree. The guide catheter was retracted into the neck. Frontal and lateral angiograms of the neck were obtained. Improvement of the degree of stenosis in the carotid bulb compared to prior CT angiogram. There is prominent luminal irregularity at the carotid bulb residual moderate stenosis. Increased tortuosity of the proximal/mid cervical left ICA with kinking. Left internal carotid artery angiograms with left anterior oblique views of the neck showed evidence of filling defects at the level of the carotid bulb. Flat panel CT of the head was obtained and post processed in a separate workstation with concurrent attending physician supervision. Selected images were sent to PACS. No evidence of hemorrhagic complication. There is mild contrast staining of the left insular and frontal cortex. Repeat left internal carotid artery angiograms with frontal and lateral views of the head showed Amy seen left M3/MCA branch to the left parietal region. Left common carotid artery angiograms with frontal and lateral views of the neck showed vertebra Gretchen of stenosis at the left ICA bulb with more prominent filling defect. At this point, patient was loaded on cangrelor followed by continuous drip. Using biplane roadmap guidance, a 4-7 mm Emboshield NAV6 cerebral protection device was advanced into the cervical left ICA. However, multiple attempts to advance a cerebral protection device through the left ICA kinking proved unsuccessful. The cerebral protection device was subsequently removed. Using biplane roadmap guidance, a 10-8 x 40 mm XACT carotid stent was navigated and deployed from the distal left common carotid artery to the proximal left internal carotid artery,  proximal to the vessel kinking. Suboptimal stent expansion was noted. Then, a 6 x 30 mm Viatrac balloon was navigated into the recently deployed stent. Angioplasty was performed under fluoroscopy. Left internal carotid artery angiograms with frontal and lateral views of the neck showed adequate stent positioning and expansion with brisk anterograde flow. Left internal carotid artery angiograms with frontal and lateral views of the head showed improvement of anterograde flow in the left MCA vascular tree with brisk anterograde flow. Delayed left common carotid artery angiograms with frontal and lateral views of the neck showed no evidence of clot formation within the stent. The catheter was subsequently Riddle. Right common femoral artery angiogram was obtained in right anterior oblique view. The puncture is at the level of the common femoral artery. The artery has normal caliber, adequate for closure device. The sheath was exchanged over the wire for an 8 [place] Angio-Seal which was utilized for access closure. Immediate hemostasis was achieved. IMPRESSION: 1. Successful mechanical thrombectomy for treatment of a proximal left M2/MCA anterior division branch occlusion achieving complete recanalization (TICI 3). 2. 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 PLAN: Continue cangrelor infusion until patient is transitioned to oral dual antiplatelet therapy. Electronically Signed   By: Baldemar Lenis M.D.   On: 06/05/2023 13:16   CT ANGIO HEAD NECK W WO  CM W PERF (CODE STROKE) Addendum Date: 06/05/2023 ADDENDUM REPORT: 06/05/2023 12:25 ADDENDUM: Please note, there is a dictation error within CTA neck impression #1, which should read: The common carotid and internal carotid arteries are patent within the neck. Atherosclerotic plaque bilaterally. Most notably,  there is progressive atherosclerotic plaque about the left carotid bifurcation and within the proximal left ICA with resultant severe near occlusive stenosis of the proximal left ICA. Also of note, atherosclerotic plaque about the right carotid bifurcation results in a 40% stenosis at the right ICA origin. Electronically Signed   By: Jackey Loge D.O.   On: 06/05/2023 12:25   Result Date: 06/05/2023 CLINICAL DATA:  Provided history: Cerebrovascular accident, unspecified mechanism. Right-sided weakness. Right-sided facial droop. Altered mental status. EXAM: CT ANGIOGRAPHY HEAD AND NECK CT PERFUSION BRAIN TECHNIQUE: Multidetector CT imaging of the head and neck was performed using the standard protocol during bolus administration of intravenous contrast. Multiplanar CT image reconstructions and MIPs were obtained to evaluate the vascular anatomy. Carotid stenosis measurements (when applicable) are obtained utilizing NASCET criteria, using the distal internal carotid diameter as the denominator. Multiphase CT imaging of the brain was performed following IV bolus contrast injection. Subsequent parametric perfusion maps were calculated using RAPID software. RADIATION DOSE REDUCTION: This exam was performed according to the departmental dose-optimization program which includes automated exposure control, adjustment of the mA and/or kV according to patient size and/or use of iterative reconstruction technique. CONTRAST:  OMNIPAQUE IOHEXOL 350 MG/ML SOLN COMPARISON:  Noncontrast head CT performed earlier today 06/05/2023. MRA head and MRA neck 10/04/2019. FINDINGS: CTA NECK FINDINGS Aortic arch: Common origin of the innominate and left common carotid arteries. Atherosclerotic plaque within the visualized thoracic aorta and proximal major branch vessels of the neck. Streak/beam hardening artifact arising from a dense right-sided contrast bolus partially obscures the right subclavian artery. Within this limitation,  there is no appreciable hemodynamically significant innominate or proximal subclavian artery stenosis. Right carotid system: CCA and ICA patent within the neck. Atherosclerotic plaque, greatest about the carotid bifurcation. Resultant 40% stenosis at the ICA origin. Left carotid system: CCA and ICA patent within the neck. Atherosclerotic plaque. Most notably, there is prominent atherosclerotic plaque about the carotid bifurcation and within the proximal ICA which has progressed from the prior MRA neck of 10/04/2019. Resultant severe (near occlusive) stenosis of the proximal ICA. Tortuosity of the cervical ICA Vertebral arteries: The vertebral arteries are patent within the neck. Streak/beam hardening artifact limits evaluation of the right vertebral artery origin. At least moderate stenosis is suspected at this site. Atherosclerotic plaque scattered elsewhere within the cervical right vertebral artery with no more than mild stenosis. Calcified atherosclerotic plaque at the left vertebral artery origin with suspected at least moderate stenosis. Nonstenotic calcified plaque elsewhere within the cervical left vertebral artery. Skeleton: Cervical spondylosis. Other neck: No neck mass or cervical lymphadenopathy. Upper chest: No consolidation within the imaged lung apices. Review of the MIP images confirms the above findings CTA HEAD FINDINGS Anterior circulation: The intracranial internal carotid arteries are patent. As sclerotic plaque within both vessels. No more than mild stenosis on the right. Up to moderate stenosis within the left cavernous segment. The M1 middle cerebral arteries are patent. Abrupt occlusion of a proximal M2 left middle cerebral artery vessel (series 11, image 22). Atherosclerotic irregularity of the M2 and more distal MCA vessels elsewhere. The anterior cerebral arteries are patent. Atherosclerotic irregularity of both vessels without high-grade proximal stenosis. A possible 2 mm periophthalmic  left ICA  aneurysm with better appreciated on the prior MRA head of 10/04/2019. Posterior circulation: The intracranial vertebral arteries are patent. Atherosclerotic plaque within the right V4 segment sites of mild stenosis. Non-stenotic atherosclerotic plaque within the left V4 segment. The basilar artery is patent. The posterior cerebral arteries are patent. Posterior communicating arteries are diminutive or absent, bilaterally. Venous sinuses: Assessment for dural venous sinus thrombosis is limited due to contrast timing. Anatomic variants: As described. Review of the MIP images confirms the above findings CT Brain Perfusion Findings: ASPECTS: CBF (<30%) Volume: 41mL Perfusion (Tmax>6.0s) volume: 86mL Mismatch Volume: 45mL Infarction Location:Left MCA vascular territory CTA head impression #1, the CT perfusion head impression and the presence of a severe stenosis of the proximal cervical left ICA called by telephone at the time of interpretation on 06/05/2023 at 8:40 am to provider ERIC [facility] , who verbally acknowledged these results. IMPRESSION: CTA neck: 1. No common carotid and internal carotid arteries are patent within the neck. Atherosclerotic plaque bilaterally. Most notably, there is progressive atherosclerotic plaque about the left carotid bifurcation and within the proximal left ICA with resultant severe, near occlusive stenosis of the proximal left ICA. Also of note, atherosclerotic plaque about the right carotid bifurcation results in 40% stenosis at the right ICA origin. 2. The vertebral arteries are patent within the neck. Atherosclerotic plaque bilaterally as described. Most notably, there is suspected at least moderate stenoses at the bilateral vertebral artery origins. 3. Aortic Atherosclerosis (ICD10-I70.0). CTA head: 1. Abrupt occlusion of a proximal M2 left middle cerebral artery vessel. 2. Background intracranial atherosclerotic disease as described. 3. A possible 2 mm periophthalmic left ICA  aneurysm was better appreciated on the prior MRA head of 10/04/2019. CT perfusion head: The perfusion software identifies a 41 mL core infarct in the left MCA vascular territory. The perfusion software identifies an 86 mL region of critically hypoperfused parenchyma within the left MCA vascular territory (utilizing the Tmax>6 seconds threshold). Reported mismatch volume: 45 mL Electronically Signed: By: Jackey Loge D.O. On: 06/05/2023 09:07   CT HEAD CODE STROKE WO CONTRAST Result Date: 06/05/2023 CLINICAL DATA:  Code stroke. Neuro deficit, acute, stroke suspected. EXAM: CT HEAD WITHOUT CONTRAST TECHNIQUE: Contiguous axial images were obtained from the base of the skull through the vertex without intravenous contrast. RADIATION DOSE REDUCTION: This exam was performed according to the departmental dose-optimization program which includes automated exposure control, adjustment of the mA and/or kV according to patient size and/or use of iterative reconstruction technique. COMPARISON:  Brain MRI 10/04/2019.  Noncontrast head CT 10/03/2019. FINDINGS: Brain: Generalized cerebral atrophy. Loss of gray-white differentiation consistent with an acute infarct within the left insula and within portions of the left frontal operculum (MCA vascular territory). Known small chronic cortically-based infarcts within the left frontal, left parietal and left occipital lobes were better appreciated on the prior brain MRI of 10/04/2019 (acute at that time). Mild patchy and ill-defined hypoattenuation within the cerebral white matter, nonspecific but compatible with chronic small vessel ischemic disease. Subcentimeter infarct within the superior right cerebellar hemisphere, new from the prior MRI but chronic in appearance. Loss of gray-white differentiation there is no acute intracranial hemorrhage. No extra-axial fluid collection. No evidence of an intracranial mass. No midline shift. Vascular: No hyperdense vessel.  Atherosclerotic  calcifications. Skull: No calvarial fracture or aggressive osseous lesion. Sinuses/Orbits: No mass or acute finding within the imaged orbits. No significant paranasal sinus disease. ASPECTS [facility] Stroke Program Early CT Score) - Ganglionic level infarction (caudate, lentiform nuclei, internal capsule, insula,  M1-M3 cortex): 5 - Supraganglionic infarction (M4-M6 cortex): 2 Total score (0-10 with 10 being normal): 7 Impression #1 called by telephone at the time of interpretation on 06/05/2023 at 8:40 am to provider Dr. Otelia Limes, who verbally acknowledged these results. IMPRESSION: 1. Acute left MCA territory infarct affecting the left insula and portions of the left frontal operculum. ASPECTS is 7. 2. Known small chronic cortically-based infarcts within the left frontal, left parietal and left occipital lobes were better appreciated on the prior brain MRI of 10/04/2019 (acute at that time). 3. Background mild cerebral white matter chronic small vessel ischemic disease. 4. Subcentimeter infarct within the right cerebellar hemisphere, new from prior MRI but chronic in appearance. 5. Generalized cerebral atrophy. Electronically Signed   By: Jackey Loge D.O.   On: 06/05/2023 08:45     PHYSICAL EXAM  Temp:  [98 F (36.7 C)-99.9 F (37.7 C)] 98 F (36.7 C) (02/18 1600) Pulse Rate:  [60-103] 79 (02/18 1600) Resp:  [14-27] 16 (02/18 1600) BP: (93-158)/(43-118) 145/57 (02/18 1600) SpO2:  [87 %-100 %] 98 % (02/18 1600) FiO2 (%):  [40 %] 40 % (02/18 1521) Weight:  [132.2 kg] 132.2 kg (02/18 0500)  General -intubated elderly patient in no acute distress  Cardiovascular -regular rhythm on monitor with frequent PVCs  Neuro - (sedation with propofol and fentanyl) briefly open eyes to voice, eyes midline, not blinking to visual threat bilaterally, pupils equal and round but small and sluggish, corneal reflexes present, cough and gag reflex present.  Patient does not respond to voice or follow commands.  No  significant response to noxious stimuli and no spontaneous movement.   ASSESSMENT/PLAN [name]. is a 83 y.o. male with history of hypertension, diabetes, CKD 3, stroke admitted for right-sided weakness numbness, aphasia, right facial droop, left gaze preference. No TNK given due to outside window.  Patient underwent mechanical thrombectomy with left ICA stenting.   2/15 in the morning patient had an episode of vomiting with coffee-ground emesis and subsequent tachycardia and tachypnea.  On evaluation this morning, he had increased work of breathing and was unable to maintain his SpO2 even on 100% O2 via nonrebreather.  Discussion was had with patient's wife and daughter, and family stated they would like to proceed with intubation and would like for patient to remain full code at this time.  CCM was consulted, and patient was transferred to ICU and intubated.  He did have an episode of aspiration during intubation and bronchoscopy was subsequently performed.  He was noted to be hypotensive after intubation, and Levophed and vasopressin were started.  Will hold Plavix for now given coffee-ground emesis.  Patient's creatinine was also noted to have worsened.  Stroke:  left MCA infarct with left M2 occlusion and left ICA near occlusion s/p IR with TICI3 and confluent HT, likely secondary to large vessel disease source versus cardiomyopathy with low EF CT left MCA infarct CT head and neck left M2 occlusion, left ICA near occlusion, right ICA 43 stenosis, bilateral VA origin severe stenosis CTP 41/86 Status post IR with TICI3 and left ICA stenting MRI left MCA infarct at left insular and left frontal operculum, prominent and confluent petechial hemorrhagic transformation CT repeat 2/7 stable confluent petechial hemorrhage 2D Echo EF 30% LDL 107 HgbA1c 6.1 P2 Y12 = 73 UDS negative SCDs for VTE prophylaxis aspirin 81 mg daily and clopidogrel 75 mg daily prior to admission, now on  Plavix with stable anemia Ongoing aggressive stroke risk factor management  Therapy recommendations:  SNF Disposition: Pending, continue palliative care for GOC discussion  History of stroke 10/08/2019 admitted for left MCA infarct due to right upper extremity weakness.  MRA head and neck showed left ICA 30% stenosis.  EF 45 to 50%.  LDL 105, A1c 5.6.  Recommended loop recorder at that time but only got 30-day CardioNet monitoring which was no A-fib.  Discharged on DAPT and Lipitor 80.  Respiratory failure Leukocytosis On 2/15, patient had episode of emesis with probable subsequent aspiration and went into respiratory distress with increased work of breathing unable to maintain SpO2 After discussion with family, patient intubated for airway protection and given respiratory failure Now no propofol and fentanyl Ventilator management per CCM WBC 10.1--23.5--44.5--41.4--29.9--19.5 Now on cefepime-> Rocephin Off vancomycin  GI bleeding Acute blood loss anemia Patient had 2 episodes of coffee-ground emesis, likely stress ulcer Resume Plavix 2/18 Hemoglobin 11.2-> 8.4->PRBC->8.8->8.1-> 7.6 Close monitoring  Cardiomyopathy CHF 09/2019 EF 45 to 50% Current admission EF 30% Cardiology on board, appreciate assistance Was on DAPT, now on hold due to GIB Agree with GDMT (losartan, entresto, metoprolol and spironolactone) as BP and Cr tolerates May consider loop recorder placement if neuro improves significantly  Diabetes HgbA1c 6.1 goal < 7.0 Hyperglycemia improved  Now off insulin drip CBG monitoring SSI  DM education and close PCP follow up  History of hypertension, now hypotensive Stable now But requiring Levophed Off epi and vasopressin Long term BP goal normotensive  Hyperlipidemia Home meds: Lipitor 80 LDL 105, goal < 70 Now lipitor 80 and Zetia  Continue statin and zetia at discharge  Dysphagia Poststroke dysphagia Speech on board N.p.o. now OG tube reinserted Start  trickle feeds 2/18  AKI on CKD  Creatinine 1.78->3.41-> 4.61->4.07-> 3.85 Baseline creatinine around 1.5 Renally dose medications as appropriate Avoid contrast Aggressive treat hypotension  Other Stroke Risk Factors Advanced age Obesity, Body mass index is 41.82 kg/m.   Other Active Problems Presyncopal episode with mild hypotension 2/14-suspect was due to straining to have a bowel movement, will prescribe as needed suppository  Hospital day # 13    Marvel Plan, MD PhD Stroke Neurology 06/18/2023 5:15 PM  This patient is critically ill due to stroke status post IR, respiratory failure, hypotension needing pressor, AKI, aspiration pneumonia and GI bleeding and at significant risk of neurological worsening, death form sepsis, septic shock, severe anemia, heart failure and renal failure. This patient's care requires constant monitoring of vital signs, hemodynamics, respiratory and cardiac monitoring, review of multiple databases, neurological assessment, discussion with family, other specialists and medical decision making of high complexity. I spent 40 minutes of neurocritical care time in the care of this patient. I had long discussion with daughter and the wife at bedside, updated pt current condition, treatment plan and potential prognosis, and answered all the questions.  They expressed understanding and appreciation.

## 2023-06-18 NOTE — Progress Notes (Signed)
 At 2158, patient had 5 Runs of V-tach.    CCM E-link Nurse Jodie, RN notified   Titrated down on Vasopressin to  0.01u/min   Signed,  Charmian Muff, RN  22:00 PM 06/17/2023

## 2023-06-18 NOTE — Progress Notes (Signed)
 NAME:  Nathan Gutman., MRN:  [id number], DOB:  Apr 15, 1941, LOS: 13 ADMISSION DATE:  06/05/2023 CONSULTATION DATE:  06/05/2023 REFERRING MD:  Otelia Limes - Neuro, CHIEF COMPLAINT: Code Stroke, post-NIR   History of Present Illness:  83 year old man who presented to [facility] ED 2/5 via EMS for unresponsiveness and R-sided deficits. PMHx significant for HTN, HLD, prior CVA without residual deficits, T2DM, CKD stage IIIb, prostate CA, gout.  Patient presented to [facility] ED with significant R-sided deficits, aphasia and poor responsiveness. LKW 2200. Code Stroke initiated. On ED arrival, patient was afebrile with HR 86, BP 154/67, RR 20, SpO2 100%. Noted aphasia, R-sided hemiplegia, R-sided facial droop. Labs were notable for WBC 6.8. Hgb 11.9, Plt 181. INR 1.0. Na 142, K 3.8, CO2 20, Cr 1.59 (baseline), LFTs WNL. Ethanol < 10. COVID negative. CT Head with acute L MCA territory infarct affecting L insula/L frontal operculum, small chronic cortically-based infarcts of L frontal/L parietal/L occipital lobes. CTA Head/Neck with abrupt occlusion of proximal M2 L MCA vessel, ?2mm periophthalmic L ICA aneurysm, bilateral ICA plaque with progressive atherosclerotic plaque L carotid bifurcation with proximal L ICA severe, near-occlusive stenosis.   Taken to [facility] emergently for mechanical thrombectomy ([name]). TICI 3 revascularization achieved. L ICA stenosis and plaque rupture noted at the bulb, treated with stenting/angioplasty. Extubated post-procedure and maintained on cangrelor gtt with plan for transition to DAPT.  PCCM consulted for post-procedure medical management.  Pertinent Medical History:   Past Medical History:  Diagnosis Date   Anemia of chronic disease    Ankle fracture 02/15/2016   Cancer (HCC)    Prostate   Chronic constipation    Chronic kidney disease    stage III   Diabetes mellitus without complication (HCC)    type II    Diabetic peripheral neuropathy (HCC)    Failure to  thrive (0-17)    Fracture of left lower leg    Gout    Hyperlipidemia    Hypertension    Morbid obesity (HCC)    Unstable gait    Significant Hospital Events: Including procedures, antibiotic start and stop dates in addition to other pertinent events   2/5 - Presented to [facility] ED with poor responsiveness, aphasia, R-sided deficits. CT Head with L MCA territory infarct. CTA Head/Neck with occlusion of L MCA M2 segment and severe near-occlusive stenosis of L ICA. Taken to [facility] for MT, stent and angioplasty ([name]). PCCM consulted for post-procedure management. 2/6 transfer out of ICU 2/15 aspiration event early a.m. resulting in respiratory distress, transferred back to ICU and emergently intubated with development of severe shock requiring epi, levo, vaso. Transfused PRBC x1 for possible GI bleed 2/16: 3 pressors, weaning. Coox without cardiogenic component. Antibiotics vanc/cefepime. CT CAP with SBO  2/18: weaned off to only levo now  Interim History / Subjective:  NAEON. Weaned of vaso and epi overnight. Now on low dose levophed. Remains on sedation. IPAL yesterday with palliative care and family would like ongoing full support at this time.   Objective:  Blood pressure (!) 119/45, pulse 67, temperature 99.9 F (37.7 C), temperature source Axillary, resp. rate (!) 22, height 5\' 10"  (1.778 m), weight 132.2 kg, SpO2 100%.    Vent Mode: PRVC FiO2 (%):  [40 %] 40 % Set Rate:  [22 bmp] 22 bmp Vt Set:  [570 mL-580 mL] 570 mL PEEP:  [5 cmH20] 5 cmH20 Plateau Pressure:  [15 cmH20-18 cmH20] 15 cmH20   Intake/Output Summary (Last 24 hours)  at 06/18/2023 0731 Last data filed at 06/18/2023 0700 Gross per 24 hour  Intake 1298.71 ml  Output 1595 ml  Net -296.29 ml   Filed Weights   06/16/23 0500 06/17/23 0500 06/18/23 0500  Weight: 134.7 kg 132.6 kg 132.2 kg   Physical Exam: General: acute on chronically ill appearing older male, laying in bed, no acute dsitress  HENT: pupils  pinpoint, sluggish. Mmm. ETT, OGT.  Pulmonary: vented, minimal settings. Decreased BLL sounds, no wheezing.  Cardiovascular: s1s2, no murmur, rub, gallop  GI: rounded, soft, non-distended, +BS Extremities: no pitting edema  Neuro: sedated, limited exam. PERRLA, pinpoint bilaterally. +gag/cough. Does not follow commands. Does not withdrawal or extend to painful stimulus GU: foley  Resolved Hospital Problem List:    Assessment & Plan:  Acute hypoxemic respiratory failure Klebsiella pneumonia 2/2 aspiration event Aspiration even 2/15 early AM with respiratory distress requiring emergent intubation. BAL + K. Pneumonia - con't cefepime  - continue mechanical ventilation  - target TVol 6-8cc/kgIBW - target Plateau Pressure < 30cm H20 - target driving pressure less than 15 cm of water - target PaO2 55-65: titrate PEEP/FiO2 per protocol - VAP and PAD protocol in place  - aggressive pulmonary hygiene   Coffee-ground emesis with concern for acute GI bleed Presumed stress ulcer bleed. Required 1 U PRBC 2/15 for resuscitation.  2/18 with OG tube that was clogged, now replaced. - h/h stable  - trend cbc  -Aspirin and Plavix are on hold for acute GI bleed requiring resuscitation.  If new OG tube does not have any coffee-ground output will restart - con't protonix BID -Trickle tube feed to start today  Septic shock secondary to aspiration pneumonia - pressors weaning to off - now on levophed low dose  - wean steroids to 50 mg daily for 2 more days then stop - con't cefepime for K. Pneumonia   Left MCA M2 occlusion and left ICA near occlusion now status post thrombectomy with complete revascularization Likely secondary to large vessel obstruction History of cardiomyopathy with low ejection fraction - stroke primary, appreciate management  - seizure precautions - neuroprotective measures- normothermia, euglycemia, HOB greater than 30, head in neutral alignment, normocapnia, normoxia - con't  lipitor 80mg  daily, zetia 10mg  daily  -PT/OT/SLP when appropriate  Heart failure with reduced ejection fraction ejection fraction of 30% with global hypokinesis, normal right ventricular function Hypertension Hyperlipidemia Coox 70s with no evidence of cardiogenic shock given recent deterioration after aspiration event.  - con't hemodynamic support with levophed, MAP >65  - con't tele monitoring  - statin/zetia as above   Type 2 diabetes - cbg q4h  - ssi   Acute kidney injury superimposed on chronic kidney disease stage IIIb - trend bmp - replete elytes - strict I&O - Avoid nephrotoxic agents, renally dose medications - ensure adequate renal perfusion   Best Practice (right click and "Reselect all SmartList Selections" daily)   Diet/type: tubefeeds and NPO DVT prophylaxis SCD Pressure ulcer(s): N/A GI prophylaxis: PPI Lines: Central line and yes and it is still needed Foley:  Yes, and it is still needed Code Status:  full code Last date of multidisciplinary goals of care discussion: Continue to update patient and family daily   CC time: Lenard Galloway [facility] 06/18/23 9:01 AM  Please see [email or web address] for pager details.  From 7A-7P if no response, please call [phone] After hours, please call ELink [phone]

## 2023-06-18 NOTE — Progress Notes (Signed)
 Afternoon round:  Difficulty with weaning sedation 2/2 vent dysynchrony. Adding dexmedetomidine to help wean off propofol and fentanyl. Starting tube feeds. Restarting plavix that was held for GIB. OGT reinserted and confirmed. On suction no further bloody output.

## 2023-06-18 NOTE — Progress Notes (Signed)
 SLP Cancellation Note  Patient Details Name: Nathan Proctor. MRN: [id number] DOB: [birth year]   Cancelled treatment:       Reason Eval/Treat Not Completed: Patient not medically ready. Will sign off and await new orders   Leontyne Manville, Riley Nearing 06/18/2023, 10:28 AM

## 2023-06-18 NOTE — Progress Notes (Signed)
 Nutrition Follow-up  DOCUMENTATION CODES:   Non-severe (moderate) malnutrition in context of chronic illness  INTERVENTION:   Initiate tube feeding when medically appropriate:  Osmolite 1.5 @ 20 ml/hr at 20 ml/h (480 ml per day) Prosource TF20 60 ml QID  Provides 1040 kcal, 110 gm protein, 365 ml free water daily  Goal of: Osomolite 1.5 @ 65 ml/hr (1560 ml per day) Prosource TF20 60 ml BID  Provides 2500 kcal, 137 gm protein, 1188 ml free water daily  Monitor diet advancement  100 mg Thiamine per tube x 3 days  Rena-vit daily per tube  Recommend Q4H insulin for tube tube feeding coverage   NUTRITION DIAGNOSIS:   Moderate Malnutrition related to chronic illness as evidenced by severe muscle depletion, mild fat depletion, severe fat depletion.  - Still applicable   GOAL:   Patient will meet greater than or equal to 90% of their needs  - Ongoing   MONITOR:   TF tolerance, Weight trends, Labs, Diet advancement  REASON FOR ASSESSMENT:   Consult Enteral/tube feeding initiation and management  ASSESSMENT:  Pt admitted for acute ischemic stroke with right sides weakness, right facial droop, and aphagia. Pt with PMH of HTN, DM, CKD III, prior CVA w/ no residual deficits.  2/05 - s/p IR mechanical thrombectomy and carotid stent placement, Cortrak placed (tip gastric) 2/07 - s/p MBS with recommendation for dysphagia 1 diet with honey-thick liquids, Transferred to progressive unit 2/09 - Cortrak and tube feeds discontinued 2/12 - repeat MBS with recommendation for dysphagia 2 diet with thin liquids 2/15 - Transferred to ICU for respiratory distress, Intubated  2/16 -  CT abdomen concerning for either close loop obstruction or internal hernia   Patient transferred to ICU and intubated for respiratory distress along with coffee-ground emesis concerning for possible GI bleed. GIB seems to be resolving. NGT with decreasing output. On levo @ 4.69 ml/hr.   Spoke to family  about bedside about initiation of tube feeds and answered any questions. Spoke to RN who reports he had a small watery bowel movement on the 2/16. Slight concern for refeeding when TF started as patient was NPO for 3 days, was eating 75-100% of his meals when on a dysphagia 2 diet. Once tube feeds started BG might increase, recommend adding Q4H insulin coverage instead of sliding scale.   Patient is currently intubated on ventilator support MV: 11.4 L/min Temp (24hrs), Avg:97.3 F (36.3 C), Min:96.2 F (35.7 C), Max:97.6 F (36.4 C)  Propofol: 28.7 ml/hr  Admit weight: 132.6 kg   Current weight: 132.6 kg   Intake/Output Summary (Last 24 hours) at 06/18/2023 1431 Last data filed at 06/18/2023 1300 Gross per 24 hour  Intake 1126.18 ml  Output 1410 ml  Net -283.82 ml   Drains/Lines: NGT output: 200 ml x 24 hours   Nutritionally Relevant Medications: Scheduled Meds:  feeding supplement (OSMOLITE 1.5 CAL)  480 mL Per Tube Q24H   insulin aspart  0-20 Units Subcutaneous Q4H   insulin glargine-yfgn  4 Units Subcutaneous BID   liver oil-zinc oxide   Topical BID   metoCLOPramide (REGLAN) injection  5 mg Intravenous Q6H   multivitamin  1 tablet Per Tube QHS   thiamine  100 mg Per Tube Daily   Continuous Infusions:  cefTRIAXone (ROCEPHIN)  IV     epinephrine Stopped (06/18/23 0118)   fentaNYL infusion INTRAVENOUS 125 mcg/hr (06/18/23 1300)   norepinephrine (LEVOPHED) Adult infusion 5 mcg/min (06/18/23 1300)   propofol (DIPRIVAN) infusion 40 mcg/kg/min (06/18/23  1300)   vasopressin Stopped (06/18/23 0008)   Labs Reviewed: BUN 69, Creatinine 3.85, Calcium 8.3 CBG ranges from 123-218 mg/dL over the last 24 hours HgbA1c 6.1  Diet Order:   Diet Order             Diet NPO time specified  Diet effective now                   EDUCATION NEEDS:   Education needs have been addressed  Skin:  Skin Assessment: Skin Integrity Issues: Skin Integrity Issues::  stage III,  Incisions Stage III: L scrotum  Incisions: R thigh  Last BM:  06/16/23 per RN small, watery   Height:   Ht Readings from Last 1 Encounters:  06/13/23 5\' 10"  (1.778 m)    Weight:   Wt Readings from Last 1 Encounters:  06/17/23 132.6 kg    Ideal Body Weight:  68.2 kg  BMI:  Body mass index is 41.94 kg/m.  Estimated Nutritional Needs:   Kcal:  2300-2500  Protein:  115-130 g  Fluid:  2.4 L  Elliot Dally, RD Registered Dietitian  See Amion for more information

## 2023-06-19 ENCOUNTER — Inpatient Hospital Stay (HOSPITAL_COMMUNITY): Payer: No Typology Code available for payment source

## 2023-06-19 DIAGNOSIS — J9601 Acute respiratory failure with hypoxia: Secondary | ICD-10-CM | POA: Diagnosis not present

## 2023-06-19 DIAGNOSIS — J150 Pneumonia due to Klebsiella pneumoniae: Secondary | ICD-10-CM | POA: Diagnosis not present

## 2023-06-19 DIAGNOSIS — Z515 Encounter for palliative care: Secondary | ICD-10-CM | POA: Diagnosis not present

## 2023-06-19 DIAGNOSIS — I639 Cerebral infarction, unspecified: Secondary | ICD-10-CM | POA: Diagnosis not present

## 2023-06-19 DIAGNOSIS — Z7189 Other specified counseling: Secondary | ICD-10-CM | POA: Diagnosis not present

## 2023-06-19 DIAGNOSIS — K920 Hematemesis: Secondary | ICD-10-CM | POA: Diagnosis not present

## 2023-06-19 LAB — CBC
HCT: 25.2 % — ABNORMAL LOW (ref 39.0–52.0)
HCT: 27.1 % — ABNORMAL LOW (ref 39.0–52.0)
Hemoglobin: 7.7 g/dL — ABNORMAL LOW (ref 13.0–17.0)
Hemoglobin: 8.3 g/dL — ABNORMAL LOW (ref 13.0–17.0)
MCH: 30.8 pg (ref 26.0–34.0)
MCH: 30.6 pg (ref 26.0–34.0)
MCHC: 30.6 g/dL (ref 30.0–36.0)
MCHC: 30.6 g/dL (ref 30.0–36.0)
MCV: 100.8 fL — ABNORMAL HIGH (ref 80.0–100.0)
MCV: 100 fL (ref 80.0–100.0)
Platelets: 118 10*3/uL — ABNORMAL LOW (ref 150–400)
Platelets: 115 10*3/uL — ABNORMAL LOW (ref 150–400)
RBC: 2.5 MIL/uL — ABNORMAL LOW (ref 4.22–5.81)
RBC: 2.71 MIL/uL — ABNORMAL LOW (ref 4.22–5.81)
RDW: 13.9 % (ref 11.5–15.5)
RDW: 13.9 % (ref 11.5–15.5)
WBC: 7.4 10*3/uL (ref 4.0–10.5)
WBC: 4.9 10*3/uL (ref 4.0–10.5)
nRBC: 0 % (ref 0.0–0.2)
nRBC: 0 % (ref 0.0–0.2)

## 2023-06-19 LAB — BASIC METABOLIC PANEL
Anion gap: 11 (ref 5–15)
Anion gap: 14 (ref 5–15)
BUN: 72 mg/dL — ABNORMAL HIGH (ref 8–23)
BUN: 94 mg/dL — ABNORMAL HIGH (ref 8–23)
CO2: 23 mmol/L (ref 22–32)
CO2: 20 mmol/L — ABNORMAL LOW (ref 22–32)
Calcium: 8.8 mg/dL — ABNORMAL LOW (ref 8.9–10.3)
Calcium: 8.6 mg/dL — ABNORMAL LOW (ref 8.9–10.3)
Chloride: 110 mmol/L (ref 98–111)
Chloride: 112 mmol/L — ABNORMAL HIGH (ref 98–111)
Creatinine, Ser: 3.31 mg/dL — ABNORMAL HIGH (ref 0.61–1.24)
Creatinine, Ser: 3.66 mg/dL — ABNORMAL HIGH (ref 0.61–1.24)
GFR, Estimated: 18 mL/min — ABNORMAL LOW (ref >60)
GFR, Estimated: 16 mL/min — ABNORMAL LOW (ref >60)
Glucose, Bld: 172 mg/dL — ABNORMAL HIGH (ref 70–99)
Glucose, Bld: 257 mg/dL — ABNORMAL HIGH (ref 70–99)
Potassium: 3.6 mmol/L (ref 3.5–5.1)
Potassium: 3.8 mmol/L (ref 3.5–5.1)
Sodium: 144 mmol/L (ref 135–145)
Sodium: 146 mmol/L — ABNORMAL HIGH (ref 135–145)

## 2023-06-19 LAB — GLUCOSE, CAPILLARY
Glucose-Capillary: 170 mg/dL — ABNORMAL HIGH (ref 70–99)
Glucose-Capillary: 201 mg/dL — ABNORMAL HIGH (ref 70–99)
Glucose-Capillary: 240 mg/dL — ABNORMAL HIGH (ref 70–99)
Glucose-Capillary: 195 mg/dL — ABNORMAL HIGH (ref 70–99)
Glucose-Capillary: 211 mg/dL — ABNORMAL HIGH (ref 70–99)
Glucose-Capillary: 219 mg/dL — ABNORMAL HIGH (ref 70–99)

## 2023-06-19 LAB — COOXEMETRY PANEL
Carboxyhemoglobin: 2.3 % — ABNORMAL HIGH (ref 0.5–1.5)
Methemoglobin: <0.7 % (ref 0.0–1.5)
O2 Saturation: 63.7 %
Total hemoglobin: 8 g/dL — ABNORMAL LOW (ref 12.0–16.0)

## 2023-06-19 LAB — TRIGLYCERIDES: Triglycerides: 109 mg/dL (ref <150)

## 2023-06-19 LAB — MAGNESIUM
Magnesium: 2.2 mg/dL (ref 1.7–2.4)
Magnesium: 2.2 mg/dL (ref 1.7–2.4)

## 2023-06-19 MED ORDER — POTASSIUM CHLORIDE 10 MEQ/50ML IV SOLN
10.0000 meq | INTRAVENOUS | Status: AC
  Administered 2023-06-19 (×2): 10 meq via INTRAVENOUS
  Filled 2023-06-19 (×2): qty 50

## 2023-06-19 MED ORDER — METHYLNALTREXONE BROMIDE 12 MG/0.6ML ~~LOC~~ SOLN
8.0000 mg | Freq: Once | SUBCUTANEOUS | Status: AC
  Administered 2023-06-19: 8 mg via SUBCUTANEOUS
  Filled 2023-06-19: qty 0.6

## 2023-06-19 MED ORDER — POTASSIUM CHLORIDE 10 MEQ/50ML IV SOLN
10.0000 meq | INTRAVENOUS | Status: AC
  Administered 2023-06-19 (×3): 10 meq via INTRAVENOUS
  Filled 2023-06-19 (×3): qty 50

## 2023-06-19 MED ORDER — DOCUSATE SODIUM 50 MG/5ML PO LIQD
100.0000 mg | Freq: Two times a day (BID) | ORAL | Status: DC
  Administered 2023-06-19 – 2023-06-23 (×7): 100 mg
  Filled 2023-06-19 (×7): qty 10

## 2023-06-19 MED ORDER — BISACODYL 10 MG RE SUPP
10.0000 mg | Freq: Every day | RECTAL | Status: DC
  Administered 2023-06-19 – 2023-06-20 (×2): 10 mg via RECTAL
  Filled 2023-06-19 (×2): qty 1

## 2023-06-19 MED ORDER — CARVEDILOL 12.5 MG PO TABS
12.5000 mg | ORAL_TABLET | Freq: Two times a day (BID) | ORAL | Status: DC
  Filled 2023-06-19: qty 1

## 2023-06-19 MED ORDER — ASPIRIN 81 MG PO CHEW
81.0000 mg | CHEWABLE_TABLET | Freq: Every day | ORAL | Status: DC
  Administered 2023-06-20 – 2023-07-05 (×16): 81 mg
  Filled 2023-06-19 (×16): qty 1

## 2023-06-19 MED ORDER — POLYETHYLENE GLYCOL 3350 17 G PO PACK
17.0000 g | PACK | Freq: Every day | ORAL | Status: DC
  Administered 2023-06-19: 17 g
  Filled 2023-06-19: qty 1

## 2023-06-19 MED ORDER — OSMOLITE 1.5 CAL PO LIQD
1000.0000 mL | ORAL | Status: DC
  Administered 2023-06-20: 1000 mL

## 2023-06-19 MED ORDER — SENNOSIDES-DOCUSATE SODIUM 8.6-50 MG PO TABS
1.0000 | ORAL_TABLET | Freq: Every day | ORAL | Status: DC
  Administered 2023-06-19: 1
  Filled 2023-06-19: qty 1

## 2023-06-19 MED ORDER — CARVEDILOL 12.5 MG PO TABS
12.5000 mg | ORAL_TABLET | Freq: Two times a day (BID) | ORAL | Status: DC
  Administered 2023-06-19 – 2023-06-23 (×9): 12.5 mg
  Filled 2023-06-19 (×9): qty 1

## 2023-06-19 NOTE — Progress Notes (Signed)
 NAME:  Hinton Luellen., MRN:  [id number], DOB:  1941-01-17, LOS: 14 ADMISSION DATE:  06/05/2023 CONSULTATION DATE:  06/05/2023 REFERRING MD:  Otelia Limes - Neuro, CHIEF COMPLAINT: Code Stroke, post-NIR   History of Present Illness:  83 year old man who presented to [facility] ED 2/5 via EMS for unresponsiveness and R-sided deficits. PMHx significant for HTN, HLD, prior CVA without residual deficits, T2DM, CKD stage IIIb, prostate CA, gout.  Patient presented to [facility] ED with significant R-sided deficits, aphasia and poor responsiveness. LKW 2200. Code Stroke initiated. On ED arrival, patient was afebrile with HR 86, BP 154/67, RR 20, SpO2 100%. Noted aphasia, R-sided hemiplegia, R-sided facial droop. Labs were notable for WBC 6.8. Hgb 11.9, Plt 181. INR 1.0. Na 142, K 3.8, CO2 20, Cr 1.59 (baseline), LFTs WNL. Ethanol < 10. COVID negative. CT Head with acute L MCA territory infarct affecting L insula/L frontal operculum, small chronic cortically-based infarcts of L frontal/L parietal/L occipital lobes. CTA Head/Neck with abrupt occlusion of proximal M2 L MCA vessel, ?2mm periophthalmic L ICA aneurysm, bilateral ICA plaque with progressive atherosclerotic plaque L carotid bifurcation with proximal L ICA severe, near-occlusive stenosis.   Taken to [facility] emergently for mechanical thrombectomy ([name]). TICI 3 revascularization achieved. L ICA stenosis and plaque rupture noted at the bulb, treated with stenting/angioplasty. Extubated post-procedure and maintained on cangrelor gtt with plan for transition to DAPT.  PCCM consulted for post-procedure medical management.  Pertinent Medical History:   Past Medical History:  Diagnosis Date   Anemia of chronic disease    Ankle fracture 02/15/2016   Cancer (HCC)    Prostate   Chronic constipation    Chronic kidney disease    stage III   Diabetes mellitus without complication (HCC)    type II    Diabetic peripheral neuropathy (HCC)    Failure to  thrive (0-17)    Fracture of left lower leg    Gout    Hyperlipidemia    Hypertension    Morbid obesity (HCC)    Unstable gait    Significant Hospital Events: Including procedures, antibiotic start and stop dates in addition to other pertinent events   2/5 - Presented to [facility] ED with poor responsiveness, aphasia, R-sided deficits. CT Head with L MCA territory infarct. CTA Head/Neck with occlusion of L MCA M2 segment and severe near-occlusive stenosis of L ICA. Taken to [facility] for MT, stent and angioplasty ([name]). PCCM consulted for post-procedure management. 2/6 transfer out of ICU 2/15 aspiration event early a.m. resulting in respiratory distress, transferred back to ICU and emergently intubated with development of severe shock requiring epi, levo, vaso. Transfused PRBC x1 for possible GI bleed 2/16: 3 pressors, weaning. Coox without cardiogenic component. Antibiotics vanc/cefepime. CT CAP with SBO  2/18: weaned off to only levo now which is likely sedation related   Interim History / Subjective:  NAEON. He is rather sedated this morning. Working on having BM. Switched to dex/fentanyl and off propofol. Will wean fentanyl and dex to RASS 0, -1. Starting back aspirin, plavix was started yesterday. No ongoing GIB at this time. SBO from 2/16 with no BM. Increasing regimen but no indication at this time for surgical intervention. Starting relistor   Objective:  Blood pressure (!) 129/45, pulse (!) 58, temperature (!) 97.2 F (36.2 C), temperature source Axillary, resp. rate (!) 22, height 5\' 10"  (1.778 m), weight 133.4 kg, SpO2 100%.    Vent Mode: PRVC FiO2 (%):  [35 %-40 %] 35 %  Set Rate:  [22 bmp] 22 bmp Vt Set:  [570 mL] 570 mL PEEP:  [5 cmH20] 5 cmH20 Plateau Pressure:  [13 cmH20-15 cmH20] 13 cmH20   Intake/Output Summary (Last 24 hours) at 06/19/2023 0759 Last data filed at 06/19/2023 0700 Gross per 24 hour  Intake 1547.39 ml  Output 1600 ml  Net -52.61 ml   Filed  Weights   06/17/23 0500 06/18/23 0500 06/19/23 0500  Weight: 132.6 kg 132.2 kg 133.4 kg   Physical Exam: General: acute on chronically ill appearing older male, laying in bed, no acute dsitress  HENT: pupils pinpoint, sluggish. Mmm. ETT, OGT.  Pulmonary: vented, minimal settings. Decreased BLL sounds, no wheezing.  Cardiovascular: s1s2, no murmur, rub, gallop  GI: rounded, soft, non-distended, +BS Extremities: no pitting edema  Neuro: sedated, limited exam. PERRLA, pinpoint bilaterally. +gag/cough. Does not follow commands. Does not withdrawal or extend to painful stimulus GU: foley  Resolved Hospital Problem List:    Assessment & Plan:  Acute hypoxemic respiratory failure Klebsiella pneumonia 2/2 aspiration event Aspiration even 2/15 early AM with respiratory distress requiring emergent intubation. BAL + K. Pneumonia - narrowed cefepime to rocephin to finish course 2/22 - attempted SBT but apneic at times. Will try again later day.  - continue mechanical ventilation  - target TVol 6-8cc/kgIBW - target Plateau Pressure < 30cm H20 - target driving pressure less than 15 cm of water - target PaO2 55-65: titrate PEEP/FiO2 per protocol - VAP and PAD protocol in place  - aggressive pulmonary hygiene   Coffee-ground emesis with concern for acute GI bleed Presumed stress ulcer bleed. Required 1 U PRBC 2/15 for resuscitation.  2/18 with OG tube that was clogged, now replaced without blood output. TF and Plavix restarted 2/18. - con't TF  - daily CBC  - resume aspirin today  - plavix started back yesterday  - con't protonix BID -Trickle tube feed to start today  Small bowel obstruction  Seen on CT CAP 2/16. Last BM 2/16. Abdomen is rounded, soft, non-distended. Hypoactive sounds. Last BM 2/16. Previous notes state family would want surgical intervention if needed. Not indicated at this time. - dulcolax suppository scheduled - reglan 5mg  q6h  - increase miralax to scheduled and prn   - senna scheduled - + colace BID  - + relistor today  Septic shock secondary to aspiration pneumonia - pressors weaning to off - now on levophed low dose  - wean steroids to 50 mg daily for 2 more days then stop (2/20) - con't rocephin for K. Pneumonia   Left MCA M2 occlusion and left ICA near occlusion now status post thrombectomy with complete revascularization Likely secondary to large vessel obstruction History of cardiomyopathy with low ejection fraction - stroke primary, appreciate management  - seizure precautions - neuroprotective measures- normothermia, euglycemia, HOB greater than 30, head in neutral alignment, normocapnia, normoxia - con't lipitor 80mg  daily, zetia 10mg  daily  -PT/OT/SLP when appropriate  Heart failure with reduced ejection fraction ejection fraction of 30% with global hypokinesis, normal right ventricular function Hypertension Hyperlipidemia Coox 70s with no evidence of cardiogenic shock given recent deterioration after aspiration event.  - con't hemodynamic support with levophed, MAP >65  - con't tele monitoring  - statin/zetia as above   Type 2 diabetes - cbg q4h  - ssi   Acute kidney injury superimposed on chronic kidney disease stage IIIb, improving - trend bmp - replete elytes - strict I&O - Avoid nephrotoxic agents, renally dose medications - ensure adequate renal  perfusion   Best Practice (right click and "Reselect all SmartList Selections" daily)   Diet/type: tubefeeds and NPO DVT prophylaxis SCD Pressure ulcer(s): N/A GI prophylaxis: PPI Lines: Central line and yes and it is still needed Foley:  Yes, and it is still needed Code Status:  full code Last date of multidisciplinary goals of care discussion: Continue to update patient and family daily. They are wanting full care at this time.   CC time: Lenard Galloway [facility] 06/19/23 7:59 AM  Please see [email or web address] for pager details.  From  7A-7P if no response, please call [phone] After hours, please call ELink [phone]

## 2023-06-19 NOTE — Progress Notes (Signed)
 OT Cancellation Note  Patient Details Name: Nathan Proctor. MRN: [id number] DOB: 10/02/1940   Cancelled Treatment:    Reason Eval/Treat Not Completed: Medical issues which prohibited therapy. Per RN attempted to wean sedation this morning and HR spike to 140s with RR in 50s; had to increased sedative. Will hold for today.  Tyler Deis, OTR/L [facility] Acute Rehabilitation Office: [phone]   Myrla Halsted 06/19/2023, 12:09 PM

## 2023-06-19 NOTE — Progress Notes (Signed)
 Palliative:  HPI: 83 y.o. male  with past medical history of HTN, HLD, diabetes, CKD stage 3b, prior CVA with no residual deficits, prostate cancer, failure to thrive admitted on 06/05/2023 with unresponsiveness with right-sided deficits found to have left MCA infarct with left M2 occlusion and left ICA near occlusion s/p IR with mechanical thrombectomy and stenting 2/5. 2/15 aspiration event resulting in severe shock requiring intubation. Hospitalization also complicated by acute GIB and CHF EF 30%.    I met today at Nathan Proctor' bedside. No family present. I have just missed daughter but she was able to speak with Dr. Wynona Neat. Discussed progress and situation with RN. Family have requested time for outcomes and decisions. I will give them space to consider conversations and decisions ahead. I will follow up with them in person or by phone tomorrow. They also have my contact information.   Exam: Opens eyes to stimuli. Not following commands. Tolerating vent support via ETT. Breathing regular, unlabored. Abd soft. Feet cool to touch.   Plan: - Full scope - Ongoing palliative support  25 min  Yong Channel, NP Palliative Medicine Team Pager [phone] (Please see [email or web address] for schedule) Team Phone [phone]

## 2023-06-19 NOTE — Progress Notes (Signed)
 PT Cancellation Note  Patient Details Name: Nathan Proctor. MRN: [id number] DOB: 1940-07-15   Cancelled Treatment:    Reason Eval/Treat Not Completed: Medical issues which prohibited therapy;Patient not medically ready (Per RN attempted to wean sedation this morning and HR spike to 140s with RR in 50s; had to increased sedative. pt remians intubated. 3rd consecutive medical cancel, will sign off and await new orders for re-eval when pt medically ready.)   Wynn Maudlin, DPT Acute Rehabilitation Services Office [phone]  06/19/23 12:17 PM

## 2023-06-19 NOTE — Progress Notes (Signed)
 At 0016, Patient had a 28 Beat Run of V-Tach.   CCM E-link Nurse Misty Stanley, RN notified and She is going to make MD aware.   Stopped Levo. AM labs drawn and sent, awaiting results.  Printed CV/ECG strip and placed in chart   Signed,  Charmian Muff, RN  1:00 AM 06/19/2023

## 2023-06-19 NOTE — Progress Notes (Addendum)
 Patient had a 12 beat run of NSVT at 2044.  eLink notified. eLilnk MD notified through RN. MD to assess remotely. Given 2 k+ runs.   2320- rPh changed TF order to every 24hr using floor stock. My current order for the same TF (osmolite 1.5 @ 20/h) was set to be stopped and restarted tmr at 1200. Tammy Sours, rPh said to just keep these going until then.    25- Another run of NSVT (7 beats) with worsening UOP and renal labs. eLink notified and Dr. Warrick Parisian to address remotely.

## 2023-06-19 NOTE — Progress Notes (Signed)
 eLink Physician-Brief Progress Note Patient Name: Nathan Proctor. DOB: 01-27-1941 MRN: [id number]   Date of Service  06/19/2023  HPI/Events of Note  Patient is reaching for his ET tube  with his right hand.  eICU Interventions  Right  wrist restraints ordered, for patient safety.        Thomasene Lot Ozell Ferrera 06/19/2023, 6:04 AM

## 2023-06-19 NOTE — Progress Notes (Signed)
 Afternoon rounds:  Came off of dex and fentanyl. Went on SBT and has since been rather tachycardic and tachypneic event after flipping back to full vent support. I have assessed him. Will get CXR as right lung sounds are diminished. Repeat CBC. Start his home carvedilol.

## 2023-06-19 NOTE — Progress Notes (Addendum)
 STROKE TEAM PROGRESS NOTE   SUBJECTIVE (INTERVAL HISTORY) No family is at the bedside. Patient still intubated, did not pass SBT. Still coughing and gag with vent, just received bolus sedation before I arrived. Pt unresponsive this time.   OBJECTIVE Temp:  [97.2 F (36.2 C)-102.5 F (39.2 C)] 102.5 F (39.2 C) (02/19 2000) Pulse Rate:  [55-123] 94 (02/19 2100) Cardiac Rhythm: Sinus tachycardia (02/19 2000) Resp:  [0-36] 0 (02/19 2100) BP: (112-151)/(41-73) 122/52 (02/19 2100) SpO2:  [95 %-100 %] 98 % (02/19 2100) FiO2 (%):  [35 %] 35 % (02/19 2000) Weight:  [133.4 kg] 133.4 kg (02/19 0500)  Recent Labs  Lab 06/19/23 0328 06/19/23 0751 06/19/23 1124 06/19/23 1533 06/19/23 1918  GLUCAP 170* 201* 240* 195* 211*   Recent Labs  Lab 06/15/23 2304 06/16/23 0414 06/16/23 1611 06/17/23 0423 06/18/23 0124 06/19/23 0031 06/19/23 2121  NA 143 141 142 143 141 144 146*  K 5.3* 5.3* 4.6 4.6 4.2 3.6 3.8  CL 107 107 108 109 107 110 112*  CO2 21* 20* 22 20* 22 23 20*  GLUCOSE 169* 180* 162* 150* 192* 172* 257*  BUN 69* 68* 66* 67* 69* 72* 94*  CREATININE 4.35* 4.61* 4.14* 4.07* 3.85* 3.31* 3.66*  CALCIUM 8.4* 8.4* 8.2* 8.6* 8.3* 8.8* 8.6*  MG 2.0 2.1  --  2.1  --  2.2 2.2  PHOS  --   --   --  4.8*  --   --   --    Recent Labs  Lab 06/16/23 0414 06/17/23 0423  AST 44* 35  ALT 30 29  ALKPHOS 45 46  BILITOT 1.1 1.1  PROT 5.5* 5.4*  ALBUMIN 2.5* 2.2*    Recent Labs  Lab 06/17/23 0423 06/17/23 1715 06/18/23 0758 06/19/23 0031 06/19/23 1544  WBC 29.9* 25.0* 19.5* 7.4 4.9  HGB 8.1* 8.0* 7.6* 7.7* 8.3*  HCT 25.4* 25.1* 24.6* 25.2* 27.1*  MCV 98.4 98.8 99.2 100.8* 100.0  PLT 157 146* 157 118* 115*   No results for input(s): "CKTOTAL", "CKMB", "CKMBINDEX", "TROPONINI" in the last 168 hours. No results for input(s): "LABPROT", "INR" in the last 72 hours.  No results for input(s): "COLORURINE", "LABSPEC", "PHURINE", "GLUCOSEU", "HGBUR", "BILIRUBINUR", "KETONESUR",  "PROTEINUR", "UROBILINOGEN", "NITRITE", "LEUKOCYTESUR" in the last 72 hours.  Invalid input(s): "APPERANCEUR"      Component Value Date/Time   CHOL 183 06/06/2023 0557   TRIG 109 06/19/2023 0031   HDL 60 06/06/2023 0557   CHOLHDL 3.1 06/06/2023 0557   VLDL 16 06/06/2023 0557   LDLCALC 107 (H) 06/06/2023 0557   LDLCALC 223 (H) 05/31/2021 0000   Lab Results  Component Value Date   HGBA1C 6.1 (H) 06/05/2023      Component Value Date/Time   LABOPIA NONE DETECTED 06/05/2023 1653   COCAINSCRNUR NONE DETECTED 06/05/2023 1653   LABBENZ NONE DETECTED 06/05/2023 1653   AMPHETMU NONE DETECTED 06/05/2023 1653   THCU NONE DETECTED 06/05/2023 1653   LABBARB NONE DETECTED 06/05/2023 1653    No results for input(s): "ETH" in the last 168 hours.   I have personally reviewed the radiological images below and agree with the radiology interpretations.  DG CHEST PORT 1 VIEW Result Date: 06/19/2023 CLINICAL DATA:  Respiratory distress. EXAM: PORTABLE CHEST 1 VIEW COMPARISON:  Radiograph 06/15/2023, CT 06/16/2023 FINDINGS: Tip of the endotracheal tube 4.5 cm from the carina. Enteric tube and left central line unchanged in positioning. Slight improved right lung aeration with persistent basilar opacity. Slight worsening patchy opacity at the left lung  base. No pneumothorax or large pleural effusion. No pulmonary edema. Stable heart size and mediastinal contours. IMPRESSION: 1. Bibasilar opacities, improving on the right but worsening on the left. 2. Stable support apparatus. Electronically Signed   By: Narda Rutherford M.D.   On: 06/19/2023 16:02   DG Abd Portable 1V Result Date: 06/18/2023 CLINICAL DATA:  Orogastric tube placement. EXAM: PORTABLE ABDOMEN - 1 VIEW COMPARISON:  Radiographs 06/05/2023.  Abdominal CT 06/16/2023. FINDINGS: 1236 hours. Tip of the enteric tube projects over the right upper quadrant of the abdomen, likely in the distal stomach. There is a small amount contrast material within  the stomach. The visualized bowel gas pattern is normal. Patient is rotated to the right with persistent right basilar airspace disease and a small right pleural effusion. IMPRESSION: Enteric tube tip projects over the distal stomach. Electronically Signed   By: Carey Bullocks M.D.   On: 06/18/2023 14:48   CT CHEST ABDOMEN PELVIS WO CONTRAST Result Date: 06/16/2023 CLINICAL DATA:  Septicemia.  Pneumonia. EXAM: CT CHEST, ABDOMEN AND PELVIS WITHOUT CONTRAST TECHNIQUE: Multidetector CT imaging of the chest, abdomen and pelvis was performed following the standard protocol without IV contrast. RADIATION DOSE REDUCTION: This exam was performed according to the departmental dose-optimization program which includes automated exposure control, adjustment of the mA and/or kV according to patient size and/or use of iterative reconstruction technique. COMPARISON:  CT angio chest from 04/04/2016. FINDINGS: CT CHEST FINDINGS Cardiovascular: Heart size is normal. Aortic atherosclerosis and multi vessel coronary artery calcifications. No pericardial effusion. Mediastinum/Nodes: Thyroid gland, trachea and esophagus appear normal. ET tube is in place with tip above the carina. There is a enteric tube with tip in the stomach.No enlarged mediastinal lymph nodes. Hilar lymph nodes are suboptimally evaluated due to lack of IV contrast. Lungs/Pleura: No pleural effusion or interstitial edema. Bilateral lower lobe airspace consolidation is identified, right greater than left. Imaging findings compatible with multifocal pneumonia. Chronic subsegmental atelectasis this is within the right middle lobe and right lower lobe with asymmetric volume loss and elevation of the right hemidiaphragm. Musculoskeletal: No chest wall mass or suspicious bone lesions identified. CT ABDOMEN PELVIS FINDINGS Hepatobiliary: No focal liver abnormality. Gallbladder appears normal. No signs of bile duct dilatation. Pancreas: Unremarkable. No pancreatic ductal  dilatation or surrounding inflammatory changes. Spleen: Normal in size without focal abnormality. Adrenals/Urinary Tract: Normal adrenal glands. Multiple stones identified within the upper pole of the left kidney which measure up to 7 mm. Bilateral exophytic kidney lesions of varying complexity are identified. These are suboptimally assessed on the current exam reflecting lack of IV contrast material. No signs of hydronephrosis. Urinary bladder is decompressed around a Foley catheter. Stomach/Bowel: Enteric tube tip is in the proximal stomach. There is retained enteric contrast material within the gastric fundus. The appendix is visualized and appears normal. There are several dilated loops of small bowel within the right hemiabdomen. The proximal small bowel loops are normal in caliber. Within the right upper quadrant of the abdomen there are multiple dilated loops of small bowel measuring up to 3.5 cm containing air-fluid levels. The bowel loops proximal and distal are normal in caliber. Within the limitations of unenhanced technique there is no significant bowel wall thickening. Distal colonic diverticulosis identified without signs of acute diverticulitis. Retained enteric contrast material identified within the colon up to the rectum Vascular/Lymphatic: Aortic atherosclerosis without aneurysm. No signs of abdominopelvic adenopathy. Reproductive: Prostate gland is either atrophic or surgically absent. Penile prosthesis identified. Other: No free fluid or fluid  collections. Musculoskeletal: No acute or significant osseous findings. IMPRESSION: 1. Bilateral lower lobe airspace consolidation, right greater than left. Imaging findings compatible with multifocal pneumonia. 2. Chronic subsegmental atelectasis within the right middle lobe and right lower lobe with asymmetric volume loss and elevation of the right hemidiaphragm. 3. Assessment of bowel pathology is limited due to lack of IV and enteric contrast material.  There are multiple abnormally dilated loops of small bowel within the right hemiabdomen. Findings are compatible with a small-bowel obstruction. As there is normal caliber proximal small bowel and normal caliber distal small bowel findings are concerning for either close loop obstruction or internal hernia. 4. Distal colonic diverticulosis without signs of acute diverticulitis. 5. Nonobstructing left renal calculi. 6. Bilateral exophytic kidney lesions of varying complexity are identified. These are suboptimally assessed on the current exam reflecting lack of IV contrast material. Further evaluation with nonemergent renal ultrasound is advised. 7.  Aortic Atherosclerosis (ICD10-I70.0). Electronically Signed   By: Signa Kell M.D.   On: 06/16/2023 15:51   ECHOCARDIOGRAM LIMITED Result Date: 06/15/2023    ECHOCARDIOGRAM LIMITED REPORT   Patient Name:   Nathan Proctor. Date of Exam: 06/15/2023 Medical Rec #:  [id number]              Height:       70.0 in Accession #:    [id number]             Weight:       263.9 lb Date of Birth:  [birth year]              BSA:          2.348 m Patient Age:    83 years               BP:           121/53 mmHg Patient Gender: M                      HR:           90 bpm. Exam Location:  Inpatient Procedure: Limited Echo, Color Doppler and Intracardiac Opacification Agent            (Both Spectral and Color Flow Doppler were utilized during            procedure). Indications:    Stroke i63.9, Shock  History:        Patient has prior history of Echocardiogram examinations, most                 recent 06/05/2023. CHF; Risk Factors:Hypertension and                 Dyslipidemia.  Sonographer:    Irving Burton Senior RDCS Referring Phys: [phone] [facility] JANE ELLISON  Sonographer Comments: Technically difficult due to body habitus, scanned supine on artificial repirator IMPRESSIONS  1. Very difficult to determine LVEF to limited visualization and frequent ectopy. . Left ventricular ejection fraction,  by estimation, is 40 to 45%. The left ventricle has mildly decreased function. Left ventricular endocardial border not optimally defined to evaluate regional wall motion.  2. Right ventricular systolic function was not well visualized. The right ventricular size is not well visualized.  3. The inferior vena cava is normal in size with <50% respiratory variability, suggesting right atrial pressure of 8 mmHg.  4. Limited echo FINDINGS  Left Ventricle: Very difficult to determine LVEF to limited visualization and frequent ectopy. Left ventricular ejection fraction,  by estimation, is 40 to 45%. The left ventricle has mildly decreased function. Left ventricular endocardial border not optimally defined to evaluate regional wall motion. Definity contrast agent was given IV to delineate the left ventricular endocardial borders. Right Ventricle: The right ventricular size is not well visualized. Right vetricular wall thickness was not well visualized. Right ventricular systolic function was not well visualized. Venous: The inferior vena cava is normal in size with less than 50% respiratory variability, suggesting right atrial pressure of 8 mmHg. Additional Comments: Color Doppler performed.  Dina Rich MD Electronically signed by Dina Rich MD Signature Date/Time: 06/15/2023/4:31:38 PM    Final    DG Abd 1 View Result Date: 06/15/2023 CLINICAL DATA:  Orogastric tube placement. EXAM: ABDOMEN - 1 VIEW COMPARISON:  Radiograph 06/08/2023 FINDINGS: Tip of the enteric tube below the diaphragm in the stomach, the side port is just beyond the gastroesophageal junction. There is gaseous gastric distension. Dilated bowel in the right abdomen up 4.7 cm, potentially small bowel, but incompletely included in the field of view. IMPRESSION: 1. Tip of the enteric tube below the diaphragm in the stomach, side-port just beyond the gastroesophageal junction. 2. Gaseous gastric distension. Additional dilated bowel in the right  abdomen likely small bowel, although incompletely included in the field of view. Electronically Signed   By: Narda Rutherford M.D.   On: 06/15/2023 15:06   DG CHEST PORT 1 VIEW Result Date: 06/15/2023 CLINICAL DATA:  10031 Cough 10031 EXAM: PORTABLE CHEST 1 VIEW COMPARISON:  June 08, 2023 FINDINGS: The cardiomediastinal silhouette is unchanged in contour.Unchanged elevation of the RIGHT hemidiaphragm. No pleural effusion. No pneumothorax. Bibasilar platelike opacities. Atherosclerotic calcifications. Similar background of interstitial prominence. IMPRESSION: Bibasilar platelike opacities, favored to reflect atelectasis. Electronically Signed   By: Meda Klinefelter M.D.   On: 06/15/2023 12:07   DG CHEST PORT 1 VIEW Result Date: 06/15/2023 CLINICAL DATA:  Respiratory failure, intubation and central line placement. EXAM: PORTABLE CHEST 1 VIEW COMPARISON:  Film earlier today at 0734 hours FINDINGS: Interval intubation with the endotracheal tube tip approximately 3.5 cm above the carina. Left jugular central line placement with the line tip in the upper SVC. No pneumothorax. Volume loss of the right lung remains with elevation of the right hemidiaphragm. Improved expansion of the left lung. Potential underlying mild pulmonary interstitial edema. No significant pleural effusions. IMPRESSION: 1. Interval intubation and left jugular central line placement. No pneumothorax. 2. Improved expansion of the left lung. Persistent volume loss of the right lung with elevation of the right hemidiaphragm. Potential underlying mild pulmonary interstitial edema. Electronically Signed   By: Irish Lack M.D.   On: 06/15/2023 10:42   DG Swallowing Func-Speech Pathology Result Date: 06/12/2023 Table formatting from the original result was not included. Modified Barium Swallow Study Patient Details Name: Nathan Proctor. MRN: [id number] Date of Birth: 03-08-1941 Today's Date: 06/12/2023 HPI/PMH: HPI: Pt is a 83 y.o. male  who was admitted as a code stroke on 02/05 due to acute onset of right-sided weakness, right facial droop and aphasia. MRI from 02/06 displayed an evolving acute left MCA infarct, most pronounced at the left insula and left frontal operculum. Pt has intentionally lost 300 pounds in the recent past, with pre-loss weight at ~600Ib. PMHx includes hypertension, diabetes, CKD, prior CVA. Clinical Impression: Clinical Impression: Pt presents with a mild-moderate oropharyngeal dysphagia (DIGEST Score: 1) primarily characterized by silent penetration of thin liquid at level of VFs, flash penetration of nectar-thick above VFs, and diffuse pharyngeal  residue. Pt's dysphagia severity level dropped from DIGEST score of 3 to 1, compared to previous MBS eval on 02/07.     Pharyngeal residue was present throughout all consistencies. Pt's posture for swallowing was consistently neutral. Pt follows commands for compensatory strategies more readily now with max multimodal cueing. Effective compensatory strategies include multiple swallows, throat clearing, and a straw when used with a verbally cued slow rate. Multiple consecutive sips of thin-liquid presented the most risk for aspiration due to silent penetration of Vfs. Unlike previous MBS study, pt tolerated solid consistency, displaying prolonged mastication but majority clearance of bolus after 2-3 swallows.     Recommendations include upgrading pt's diet to dysphagia 2 (finely chopped) and thin liquids to optimize safety and efficiency for swallowing. SLP f/u to monitor tolerance of thin-liquids with compensatory strategies is highly recommended. Pt needs to remain upright for 30 minutes after PO intake due to remaining pharyngeal residue to optimize airway safety. Factors that may increase risk of adverse event in presence of aspiration [name] & Clearance [name] 2021): Factors that may increase risk of adverse event in presence of aspiration [name] & Clearance [name] 2021): Dependence for  feeding and/or oral hygiene Recommendations/Plan: Swallowing Evaluation Recommendations Swallowing Evaluation Recommendations Recommendations: PO diet PO Diet Recommendation: Dysphagia 2 (Finely chopped); Thin liquids (Level 0) Liquid Administration via: Straw Medication Administration: Crushed with puree Supervision: Full supervision/cueing for swallowing strategies; Full assist for feeding Swallowing strategies  : Slow rate; Small bites/sips; Multiple dry swallows after each bite/sip; Clear throat intermittently Postural changes: Position pt fully upright for meals; Stay upright 30-60 min after meals Oral care recommendations: Oral care BID (2x/day); Staff/trained caregiver to provide oral care Treatment Plan Treatment Plan Follow-up recommendations: Follow physicians's recommendations for discharge plan and follow up therapies Functional status assessment: Patient has had a recent decline in their functional status and demonstrates the ability to make significant improvements in function in a reasonable and predictable amount of time. Recommendations Recommendations for follow up therapy are one component of a multi-disciplinary discharge planning process, led by the attending physician.  Recommendations may be updated based on patient status, additional functional criteria and insurance authorization. Assessment: Orofacial Exam: Orofacial Exam Oral Cavity: Oral Hygiene: WFL Oral Cavity - Dentition: Adequate natural dentition Orofacial Anatomy: WFL Anatomy: Anatomy: WFL Boluses Administered: Boluses Administered Boluses Administered: Thin liquids (Level 0); Mildly thick liquids (Level 2, nectar thick); Moderately thick liquids (Level 3, honey thick); Puree; Solid  Oral Impairment Domain: Oral Impairment Domain Lip Closure: Interlabial escape, no progression to anterior lip Tongue control during bolus hold: Cohesive bolus between tongue to palatal seal Bolus preparation/mastication: Slow prolonged chewing/mashing  with complete recollection Bolus transport/lingual motion: Brisk tongue motion Oral residue: Residue collection on oral structures Location of oral residue : Tongue Initiation of pharyngeal swallow : Pyriform sinuses  Pharyngeal Impairment Domain: Pharyngeal Impairment Domain Soft palate elevation: No bolus between soft palate (SP)/pharyngeal wall (PW) Laryngeal elevation: Partial superior movement of thyroid cartilage/partial approximation of arytenoids to epiglottic petiole Anterior hyoid excursion: Complete anterior movement Epiglottic movement: Complete inversion Laryngeal vestibule closure: Incomplete, narrow column air/contrast in laryngeal vestibule Pharyngeal stripping wave : Present - complete Pharyngeal contraction (A/P view only): N/A Pharyngoesophageal segment opening: Complete distension and complete duration, no obstruction of flow Tongue base retraction: Trace column of contrast or air between tongue base and PPW Pharyngeal residue: Collection of residue within or on pharyngeal structures Location of pharyngeal residue: Diffuse (>3 areas)  Esophageal Impairment Domain: Esophageal Impairment Domain Esophageal clearance upright position: -- (  Not tested) Pill: No data recorded Penetration/Aspiration Scale Score: Penetration/Aspiration Scale Score 1.  Material does not enter airway: Solid; Puree; Moderately thick liquids (Level 3, honey thick) 2.  Material enters airway, remains ABOVE vocal cords then ejected out: Mildly thick liquids (Level 2, nectar thick) 5.  Material enters airway, CONTACTS cords and not ejected out: Thin liquids (Level 0) Compensatory Strategies: Compensatory Strategies Compensatory strategies: Yes Straw: Effective Effective Straw: Mildly thick liquid (Level 2, nectar thick) (Effective with slow rate applied.) Multiple swallows: Effective Effective Multiple Swallows: Thin liquid (Level 0); Mildly thick liquid (Level 2, nectar thick); Moderately thick liquid (Level 3, honey thick);  Puree; Solid   General Information: Caregiver present: No  Diet Prior to this Study: Dysphagia 1 (pureed); Moderately thick liquids (Level 3, honey thick)   Temperature : Normal   Respiratory Status: WFL   Supplemental O2: None (Room air)   History of Recent Intubation: Yes  Behavior/Cognition: Alert; Cooperative; Pleasant mood; Requires cueing Self-Feeding Abilities: Needs assist with self-feeding Baseline vocal quality/speech: Normal Volitional Cough: Unable to elicit Volitional Swallow: Able to elicit Exam Limitations: No limitations Goal Planning: Prognosis for improved oropharyngeal function: Good Barriers to Reach Goals: Language deficits No data recorded No data recorded No data recorded Pain: Pain Assessment Pain Assessment: No/denies pain Pain Score: 0 End of Session: Start Time:SLP Start Time (ACUTE ONLY): 1310 Stop Time: SLP Stop Time (ACUTE ONLY): 1335 Time Calculation:SLP Time Calculation (min) (ACUTE ONLY): 25 min Charges: SLP Evaluations $ SLP Speech Visit: 1 Visit SLP Evaluations $MBS Swallow: 1 Procedure $Swallowing Treatment: 1 Procedure SLP visit diagnosis: SLP Visit Diagnosis: Dysphagia, oropharyngeal phase (R13.12) Past Medical History: Past Medical History: Diagnosis Date  Anemia of chronic disease   Ankle fracture 02/15/2016  Cancer (HCC)   Prostate  Chronic constipation   Chronic kidney disease   stage III  Diabetes mellitus without complication (HCC)   type II   Diabetic peripheral neuropathy (HCC)   Failure to thrive (0-17)   Fracture of left lower leg   Gout   Hyperlipidemia   Hypertension   Morbid obesity (HCC)   Unstable gait  Past Surgical History: Past Surgical History: Procedure Laterality Date  IR CT HEAD LTD  06/05/2023  IR INTRAVSC STENT CERV CAROTID W/O EMB-PROT MOD SED INC ANGIO  06/05/2023  IR PERCUTANEOUS ART THROMBECTOMY/INFUSION INTRACRANIAL INC DIAG ANGIO  06/05/2023  IR US GUIDE VASC ACCESS RIGHT  06/05/2023  ORIF ANKLE FRACTURE Left 02/29/2016  Procedure: OPEN REDUCTION INTERNAL  FIXATION (ORIF) ANKLE FRACTURE;  Surgeon: Yolonda Kida, MD;  Location: [facility];  Service: Orthopedics;  Laterality: Left;  PROSTATE SURGERY    RADIOLOGY WITH ANESTHESIA N/A 06/05/2023  Procedure: IR WITH ANESTHESIA;  Surgeon: Radiologist, Medication, MD;  Location: [facility];  Service: Radiology;  Laterality: N/A; Claudine Mouton 06/12/2023, 3:16 PM  DG Abd 1 View Result Date: 06/09/2023 CLINICAL DATA:  [id number]. Encounter for feeding tube placement. EXAM: ABDOMEN - 1 VIEW COMPARISON:  Abdomen film 06/05/2023. FINDINGS: Dobbhoff feeding tube is well placed with the radiopaque tip in the distal stomach. The visualized bowel pattern is nonobstructive. There is barium newly seen in the flexures and transverse colon. There is no supine evidence of free air. There is atelectasis in the lung bases. Cardiomegaly. IMPRESSION: 1. Dobbhoff feeding tube well placed with the radiopaque tip in the distal stomach. 2. Nonobstructive bowel gas pattern. 3. Cardiomegaly. Electronically Signed   By: Almira Bar M.D.   On: 06/09/2023 03:41   DG CHEST PORT 1  VIEW Result Date: 06/08/2023 CLINICAL DATA:  CHF EXAM: PORTABLE CHEST 1 VIEW COMPARISON:  01/06/2023 FINDINGS: Feeding tube in place. There is cardiomegaly with vascular congestion. Chronic elevation of the right hemidiaphragm with right base atelectasis. Interstitial prominence throughout the lungs, right greater than left could reflect interstitial edema. Possible small effusions. No acute bony abnormality. IMPRESSION: Cardiomegaly with vascular congestion and probable mild interstitial edema. Question small bilateral effusions. Chronic elevation of the right hemidiaphragm with right base atelectasis. Electronically Signed   By: Charlett Nose M.D.   On: 06/08/2023 18:20   DG Swallowing Func-Speech Pathology Result Date: 06/07/2023 Table formatting from the original result was not included. Modified Barium Swallow Study Study completed and documented by [name], SLP Student Supervised and reviewed by Harlon Ditty, MA CCC-SLP [facility] Secure Chat Preferred Office [phone] Patient Details Name: Nathan Proctor. MRN: [id number] Date of Birth: [birth year] Today's Date: 06/07/2023 HPI/PMH: HPI: Pt is a 83 y.o. male who was admitted as a code stroke on 02/05 due to acute onset of right-sided weakness, right facial droop and aphasia. MRI from 02/06 displayed an evolving acute left MCA infarct, most pronounced at the left insula and left frontal operculum. Pt has intentionally lost 300 pounds in the recent past, with pre-loss weight at ~600Ib. PMHx includes hypertension, diabetes, CKD, prior CVA. Clinical Impression: Clinical Impression: Pt presented with a moderate-severe oropharyngeal dysphagia (DIGEST Score: 3) primarily characterized by silent aspiration of thin liquid, silent penetration of nectar-thick liquid, diffuse pharyngeal residue collection during thin/nectar-thick consistencies, and posterior-escape of bolus across multiple consistencies. Pt positioning was a potential limitation for examination due to consistent posterior head position. More anterior head positioning during nectar-thick liquid displayed less pharyngeal residue, increased hyo-laryngeal excursion, and overall better airway safety. This positioning was not present throughout due to pt's receptive language deficits to understand cues. Alternating between honey-thick and puree consistencies provided more oropharyngeal bolus clearance and no signs of aspiration. Recommend a dysphagia 1 (puree) and honey-thick liquid diet to optimize pt's airway safety and potential for meeting nutritional needs. Slow rate, small bites/sips, natural head posturing, and multiple swallows are compensatory strategies that displayed best efficiency with pt. F/u with SLP for family education, compensatory strategy training, and upgraded diet trials is recommended. Factors that may increase  risk of adverse event in presence of aspiration [name] & Clearance [name] 2021): Factors that may increase risk of adverse event in presence of aspiration [name] & Clearance [name] 2021): Weak cough Recommendations/Plan: Swallowing Evaluation Recommendations Swallowing Evaluation Recommendations Recommendations: PO diet PO Diet Recommendation: Dysphagia 1 (Pureed); Moderately thick liquids (Level 3, honey thick) Liquid Administration via: Spoon Medication Administration: Crushed with puree Supervision: Full supervision/cueing for swallowing strategies; Full assist for feeding Swallowing strategies  : Slow rate; Small bites/sips; Multiple dry swallows after each bite/sip (Pt needs to be in a neutral head position (no posterior head tilt).) Postural changes: Position pt fully upright for meals; Stay upright 30-60 min after meals Oral care recommendations: Oral care BID (2x/day); Staff/trained caregiver to provide oral care Caregiver Recommendations: Have oral suction available Treatment Plan Treatment Plan Treatment recommendations: Therapy as outlined in treatment plan below Follow-up recommendations: Follow physicians's recommendations for discharge plan and follow up therapies Functional status assessment: Patient has had a recent decline in their functional status and demonstrates the ability to make significant improvements in function in a reasonable and predictable amount of time. Treatment frequency: Min 2x/week Treatment duration: 2 weeks Interventions: Aspiration precaution training; Compensatory techniques; Patient/family education; Diet toleration  management by SLP; Trials of upgraded texture/liquids Recommendations Recommendations for follow up therapy are one component of a multi-disciplinary discharge planning process, led by the attending physician.  Recommendations may be updated based on patient status, additional functional criteria and insurance authorization. Assessment: Orofacial Exam: Orofacial Exam Oral Cavity  - Dentition: Adequate natural dentition Orofacial Anatomy: WFL Anatomy: Anatomy: WFL Boluses Administered: Boluses Administered Boluses Administered: Thin liquids (Level 0); Mildly thick liquids (Level 2, nectar thick); Moderately thick liquids (Level 3, honey thick); Puree; Solid  Oral Impairment Domain: Oral Impairment Domain Lip Closure: Escape progressing to mid-chin Tongue control during bolus hold: Posterior escape of greater than half of bolus Bolus preparation/mastication: -- (Pt did not chew solid and SLP had to remove it from mouth.) Bolus transport/lingual motion: Repetitive/disorganized tongue motion Oral residue: Residue collection on oral structures Location of oral residue : Tongue; Palate Initiation of pharyngeal swallow : Pyriform sinuses  Pharyngeal Impairment Domain: Pharyngeal Impairment Domain Soft palate elevation: No bolus between soft palate (SP)/pharyngeal wall (PW) Laryngeal elevation: Partial superior movement of thyroid cartilage/partial approximation of arytenoids to epiglottic petiole Anterior hyoid excursion: Partial anterior movement Epiglottic movement: Partial inversion Laryngeal vestibule closure: Incomplete, narrow column air/contrast in laryngeal vestibule Pharyngeal stripping wave : Present - complete Pharyngeal contraction (A/P view only): N/A Pharyngoesophageal segment opening: Complete distension and complete duration, no obstruction of flow Tongue base retraction: Narrow column of contrast or air between tongue base and PPW Pharyngeal residue: Collection of residue within or on pharyngeal structures Location of pharyngeal residue: Diffuse (>3 areas)  Esophageal Impairment Domain: Esophageal Impairment Domain Esophageal clearance upright position: -- (Not tested.) Pill: No data recorded Penetration/Aspiration Scale Score: Penetration/Aspiration Scale Score 1.  Material does not enter airway: Puree; Moderately thick liquids (Level 3, honey thick) 3.  Material enters airway,  remains ABOVE vocal cords and not ejected out: Mildly thick liquids (Level 2, nectar thick) 8.  Material enters airway, passes BELOW cords without attempt by patient to eject out (silent aspiration) : Thin liquids (Level 0) Compensatory Strategies: Compensatory Strategies Compensatory strategies: Yes Straw: Ineffective Multiple swallows: Effective (Required max cueing due to pt's language.) Effective Multiple Swallows: Puree; Moderately thick liquid (Level 3, honey thick); Mildly thick liquid (Level 2, nectar thick)   General Information: Caregiver present: No  Diet Prior to this Study: NPO   Temperature : Normal   Respiratory Status: WFL   Supplemental O2: None (Room air)   No data recorded Behavior/Cognition: Alert; Cooperative; Pleasant mood; Requires cueing Self-Feeding Abilities: Needs assist with self-feeding Baseline vocal quality/speech: Dysphonic Volitional Cough: Unable to elicit Volitional Swallow: Able to elicit Exam Limitations: Poor positioning Goal Planning: Prognosis for improved oropharyngeal function: Good Barriers to Reach Goals: Language deficits No data recorded No data recorded Consulted and agree with results and recommendations: Pt unable/family or caregiver not available Pain: Pain Assessment Pain Assessment: No/denies pain Faces Pain Scale: 0 Facial Expression: 0 Body Movements: 0 Muscle Tension: 0 Compliance with ventilator (intubated pts.): N/A Vocalization (extubated pts.): 0 CPOT Total: 0 End of Session: Start Time:SLP Start Time (ACUTE ONLY): 0903 Stop Time: SLP Stop Time (ACUTE ONLY): 0930 Time Calculation:SLP Time Calculation (min) (ACUTE ONLY): 27 min Charges: SLP Evaluations $ SLP Speech Visit: 1 Visit SLP Evaluations $BSS Swallow: 1 Procedure $MBS Swallow: 1 Procedure $ SLP EVAL LANGUAGE/SOUND PRODUCTION: 1 Procedure $Swallowing Treatment: 1 Procedure SLP visit diagnosis: SLP Visit Diagnosis: Dysphagia, oropharyngeal phase (R13.12) Past Medical History: Past Medical History:  Diagnosis Date  Anemia of chronic disease   Ankle  fracture 02/15/2016  Cancer (HCC)   Prostate  Chronic constipation   Chronic kidney disease   stage III  Diabetes mellitus without complication (HCC)   type II   Diabetic peripheral neuropathy (HCC)   Failure to thrive (0-17)   Fracture of left lower leg   Gout   Hyperlipidemia   Hypertension   Morbid obesity (HCC)   Unstable gait  Past Surgical History: Past Surgical History: Procedure Laterality Date  IR CT HEAD LTD  06/05/2023  IR INTRAVSC STENT CERV CAROTID W/O EMB-PROT MOD SED INC ANGIO  06/05/2023  IR PERCUTANEOUS ART THROMBECTOMY/INFUSION INTRACRANIAL INC DIAG ANGIO  06/05/2023  IR US GUIDE VASC ACCESS RIGHT  06/05/2023  ORIF ANKLE FRACTURE Left 02/29/2016  Procedure: OPEN REDUCTION INTERNAL FIXATION (ORIF) ANKLE FRACTURE;  Surgeon: Yolonda Kida, MD;  Location: [facility];  Service: Orthopedics;  Laterality: Left;  PROSTATE SURGERY    RADIOLOGY WITH ANESTHESIA N/A 06/05/2023  Procedure: IR WITH ANESTHESIA;  Surgeon: Radiologist, Medication, MD;  Location: [facility];  Service: Radiology;  Laterality: N/A; DeBlois, Riley Nearing 06/07/2023, 12:15 PM  CT HEAD WO CONTRAST ( ) Result Date: 06/07/2023 CLINICAL DATA:  83 year old male status post code stroke presentation, left MCA M2 occlusion, with left MCA infarct with confluent petechial hemorrhage. EXAM: CT HEAD WITHOUT CONTRAST TECHNIQUE: Contiguous axial images were obtained from the base of the skull through the vertex without intravenous contrast. RADIATION DOSE REDUCTION: This exam was performed according to the departmental dose-optimization program which includes automated exposure control, adjustment of the mA and/or kV according to patient size and/or use of iterative reconstruction technique. COMPARISON:  Brain MRI yesterday.  Head CT 06/05/2023. FINDINGS: Brain: Confluent mixed density infarct in the left MCA middle division, epicenter at the insula and operculum. Confluent petechial hemorrhage on series 3,  image 16 appears stable from the MRI. Superimposed trace subarachnoid blood is difficult to exclude by CT (series 3, image 13), but was not apparent on MRI. Stable mild mass effect on the left lateral ventricle with only trace rightward midline shift (series 3, image 16). No ventriculomegaly. Elsewhere gray-white differentiation is stable from the presentation CT. Basilar cisterns remain patent. Vascular: Resolved hyperdense left MCA since presentation. Calcified atherosclerosis at the skull base. Skull: Stable, intact. Sinuses/Orbits: Visualized paranasal sinuses and mastoids are stable and well aerated. Other: Left nasoenteric tube now in place. Stable orbit and scalp soft tissues. IMPRESSION: 1. Stable by CT confluent Left MCA middle division infarct with petechial hemorrhage (Heidelberg classification 1b: HI2, confluent petechiae, no mass effect). Questionable trace superimposed SAH, but was not apparent on MRI. 2. Stable minimal intracranial mass effect. 3. No new intracranial abnormality. Electronically Signed   By: Odessa Fleming M.D.   On: 06/07/2023 05:37   MR BRAIN WO CONTRAST Result Date: 06/06/2023 CLINICAL DATA:  Follow-up examination for stroke. EXAM: MRI HEAD WITHOUT CONTRAST TECHNIQUE: Multiplanar, multiecho pulse sequences of the brain and surrounding structures were obtained without intravenous contrast. COMPARISON:  Comparison made to multiple previous exams from 06/05/2023. FINDINGS: Brain: Examination degraded by motion artifact. Cerebral volume within normal limits for age. Patchy T2/FLAIR hyperintensity involving the periventricular deep white matter both cerebral hemispheres, consistent with chronic small vessel ischemic disease, mild in nature. Small remote infarct noted within the right cerebellum. Confluent restricted diffusion involving the left insula and left frontal operculum, consistent with evolving acute left MCA distribution infarct. Area of infarction measures up to approximately 6 cm  in AP diameter. Prominent associated susceptibility artifact, consistent with petechial hemorrhage (series 7,  image 61) ( Heidelberg classification 1b: HI2, confluent petechiae, no mass effect. No frank or organized hematoma evident by MRI. No significant regional mass effect. Few additional small foci of infarction noted within the left parieto-occipital region posteriorly (series 3, images 40, 29). Apparent diffusion signal along the right frontal parafalcine region felt to be consistent with artifact related to dural calcification. Note made of a few additional chronic micro hemorrhages within the left cerebellum and right thalamus. No mass lesion or midline shift. Ventricles normal size without hydrocephalus. No extra-axial fluid collection. Pituitary gland suprasellar region within normal limits. Vascular: Major intracranial vascular flow voids are maintained. Skull and upper cervical spine: Craniocervical junction within normal limits. Bone marrow signal intensity overall within normal limits. No scalp soft tissue abnormality. Sinuses/Orbits: Prior bilateral ocular lens replacement. Paranasal sinuses are clear. No mastoid effusion. Other: None. IMPRESSION: 1. Evolving acute left MCA distribution infarct, most pronounced at the left insula and left frontal operculum. Prominent associated petechial hemorrhage without frank intraparenchymal hematoma (Heidelberg classification 1b: HI2, confluent petechiae, no mass effect). 2. Few additional small foci of acute infarction within the left parieto-occipital region posteriorly. 3. Underlying mild chronic microvascular ischemic disease with small remote right cerebellar infarct. Electronically Signed   By: Rise Mu M.D.   On: 06/06/2023 03:12   ECHOCARDIOGRAM COMPLETE Result Date: 06/05/2023    ECHOCARDIOGRAM REPORT   Patient Name:   Nathan Proctor. Date of Exam: 06/05/2023 Medical Rec #:  [id number]              Height:       70.0 in Accession #:     [id number]             Weight:       292.3 lb Date of Birth:  1940-06-06              BSA:          2.453 m Patient Age:    82 years               BP:           129/72 mmHg Patient Gender: M                      HR:           89 bpm. Exam Location:  Inpatient Procedure: 2D Echo, Color Doppler, Cardiac Doppler and Intracardiac            Opacification Agent Indications:    Stroke I63.9  History:        Patient has prior history of Echocardiogram examinations, most                 recent 10/06/2019. Risk Factors:Diabetes, Hypertension and                 Dyslipidemia.  Sonographer:    Harriette Bouillon RDCS Referring Phys: Lynnae January IMPRESSIONS  1. Left ventricular ejection fraction, by estimation, is 30%. The left ventricle has moderately decreased function. The left ventricle demonstrates global hypokinesis. There is mild left ventricular hypertrophy.  2. Right ventricular systolic function is normal. The right ventricular size is normal.  3. Trivial mitral valve regurgitation.  4. The aortic valve is tricuspid. Aortic valve regurgitation is not visualized.  5. The inferior vena cava is normal in size with greater than 50% respiratory variability, suggesting right atrial pressure of 3 mmHg. Comparison(s): The left ventricular function is worsened. FINDINGS  Left Ventricle: Left ventricular ejection fraction, by estimation, is 30%. The left ventricle has moderately decreased function. The left ventricle demonstrates global hypokinesis. Definity contrast agent was given IV to delineate the left ventricular endocardial borders. The left ventricular internal cavity size was normal in size. There is mild left ventricular hypertrophy. Right Ventricle: The right ventricular size is normal. Right vetricular wall thickness was not assessed. Right ventricular systolic function is normal. Left Atrium: Left atrial size was normal in size. Right Atrium: Right atrial size was normal in size. Pericardium: There is no evidence of  pericardial effusion. Mitral Valve: There is mild thickening of the mitral valve leaflet(s). Mild to moderate mitral annular calcification. Trivial mitral valve regurgitation. Tricuspid Valve: The tricuspid valve is normal in structure. Tricuspid valve regurgitation is trivial. Aortic Valve: The aortic valve is tricuspid. Aortic valve regurgitation is not visualized. Pulmonic Valve: The pulmonic valve was normal in structure. Pulmonic valve regurgitation is not visualized. Aorta: The aortic root and ascending aorta are structurally normal, with no evidence of dilitation. Venous: The inferior vena cava is normal in size with greater than 50% respiratory variability, suggesting right atrial pressure of 3 mmHg. IAS/Shunts: The interatrial septum was not assessed.  LEFT VENTRICLE PLAX 2D LVIDd:         5.00 cm   Diastology LVIDs:         4.40 cm   LV e' lateral: 5.33 cm/s LV PW:         1.20 cm LV IVS:        1.10 cm LVOT diam:     2.30 cm LV SV:         55 LV SV Index:   23 LVOT Area:     4.15 cm  RIGHT VENTRICLE RV S prime:     14.40 cm/s LEFT ATRIUM         Index LA diam:    4.60 cm 1.88 cm/m  AORTIC VALVE LVOT Vmax:   65.50 cm/s LVOT Vmean:  45.200 cm/s LVOT VTI:    0.133 m  AORTA Ao Root diam: 3.00 cm Ao Asc diam:  3.50 cm  SHUNTS Systemic VTI:  0.13 m Systemic Diam: 2.30 cm Dietrich Pates MD Electronically signed by Dietrich Pates MD Signature Date/Time: 06/05/2023/9:33:38 PM    Final    DG Abd Portable 1V Result Date: 06/05/2023 CLINICAL DATA:  Feeding tube placement. EXAM: PORTABLE ABDOMEN - 1 VIEW COMPARISON:  None Available. FINDINGS: Distal tip of feeding tube is seen in expected position of distal stomach. IMPRESSION: Distal tip of feeding tube seen in expected position of distal stomach. Electronically Signed   By: Lupita Raider M.D.   On: 06/05/2023 15:49   CT HEAD WO CONTRAST Result Date: 06/05/2023 CLINICAL DATA:  Stroke, follow-up. Status post intracranial mechanical thrombectomy and stenting of the left  ICA bifurcation. EXAM: CT HEAD WITHOUT CONTRAST TECHNIQUE: Contiguous axial images were obtained from the base of the skull through the vertex without intravenous contrast. RADIATION DOSE REDUCTION: This exam was performed according to the departmental dose-optimization program which includes automated exposure control, adjustment of the mA and/or kV according to patient size and/or use of iterative reconstruction technique. COMPARISON:  CT head without contrast and CT angio head and neck 06/05/2023. By plain CT 06/05/2023. FINDINGS: Brain: The study is mildly degraded by patient motion. The left insular and left opercular infarct is somewhat obscured by patient motion. The cortex is slightly hyperdense, potentially reflecting reperfusion. No hemorrhage is present. Basal ganglia  are intact. Subcortical white matter hypoattenuation in the high left frontal lobe is new. Mild right-sided white matter disease is stable. Basal ganglia are intact. A remote lacunar infarct is again noted in the right cerebellum. The brainstem and cerebellum are otherwise within normal limits. Vascular: Atherosclerotic calcifications are present within the cavernous internal carotid arteries and at the normal origin of both vertebral arteries. No hyperdense vessel is present. Skull: Calvarium is intact. No focal lytic or blastic lesions are present. No significant extracranial soft tissue lesion is present. Sinuses/Orbits: The paranasal sinuses and mastoid air cells are clear. Bilateral lens replacements are noted. Globes and orbits are otherwise unremarkable. IMPRESSION: 1. The left insular and left opercular infarct is somewhat obscured by patient motion. 2. The cortex is slightly hyperdense, potentially reflecting reperfusion. 3. No hemorrhage. 4. Subcortical white matter hypoattenuation in the high left frontal lobe is new. This may reflect ischemic changes or edema related to the reperfusion. 5. Remote lacunar infarct of the right  cerebellum. 6. Stable mild right-sided white matter disease. This likely reflects the sequela of chronic microvascular ischemia. Electronically Signed   By: Marin Roberts M.D.   On: 06/05/2023 14:38   IR PERCUTANEOUS ART THROMBECTOMY/INFUSION INTRACRANIAL INC DIAG ANGIO Result Date: 06/05/2023 INDICATION: 83 year old male presenting with right-sided weakness and aphasia; NIHSS 28. His last known well was 10 p.m. on 06/04/2023. His past medical history significant for prior stroke, hypertension, diabetes and chronic kidney disease; baseline modified Rankin scale 0. Head CT showed hypodensity within the left insula, basal ganglia and left frontal operculum (ASPECTS 7). No IV thrombolytic given as patient was outside the window. CT angiogram of the head and neck showed an occlusion of a left M2/MCA anterior division branch. CT perfusion showed a 41 mL core infarct with a 45 mL ischemic penumbra. She was transferred to our service for mechanical thrombectomy. EXAM: ULTRASOUND-GUIDED VASCULAR ACCESS DIAGNOSTIC CEREBRAL ANGIOGRAM MECHANICAL THROMBECTOMY FLAT PANEL HEAD CT LEFT CAROTID STENTING AND ANGIOPLASTY WITHOUT CEREBRAL PROTECTION DEVICE COMPARISON:  CT/CT angiogram of the head and neck June 05, 2023. MEDICATIONS: No antibiotics administered. ANESTHESIA/SEDATION: The procedure was performed under general anesthesia. CONTRAST:  80 mL of Omnipaque 300 milligram/mL FLUOROSCOPY: Radiation Exposure Index (as provided by the fluoroscopic device): 1315 mGy Kerma COMPLICATIONS: None immediate. TECHNIQUE: Informed written consent was obtained from the patient's wife after a thorough discussion of the procedural risks, benefits and alternatives. All questions were addressed. Maximal Sterile Barrier Technique was utilized including caps, mask, sterile gowns, sterile gloves, sterile drape, hand hygiene and skin antiseptic. A timeout was performed prior to the initiation of the procedure. The right groin was prepped  and draped in the usual sterile fashion. Using a micropuncture kit and the modified Seldinger technique, access was gained to the right common femoral artery and an 8 French sheath was placed. Real-time ultrasound guidance was utilized for vascular access including the acquisition of a permanent ultrasound image documenting patency of the accessed vessel. Under fluoroscopy, an 8 [place] Walrus balloon guide catheter was navigated over a 6 [place] VTK catheter and a 0.035" Terumo Glidewire into the aortic arch. The catheter was placed into the left common carotid artery and then advanced into the left internal carotid artery. The diagnostic catheter was removed. Frontal and lateral angiograms of the head were obtained. FINDINGS: 1. Ultrasound showed heavily calcified right common femoral artery is maintained patency and caliber. 2. Proximal occlusion of a left M2/MCA anterior division branch. 3. Atherosclerotic changes of the intracranial left ICA with mild stenosis  at the distal cavernous segment. 4. 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 PROCEDURE: Using biplane roadmap guidance, a Red 62 aspiration catheter was navigated over Colossus 35 microguidewire into the cavernous segment of the left ICA. The aspiration catheter was then advanced to the level of occlusion and connected to an aspiration pump. Continuous aspiration was performed for 2 minutes. The guide catheter was connected to a VacLok syringe and the guiding catheter balloon was inflated. The aspiration catheter was subsequently removed under constant aspiration. The guide catheter was aspirated for debris. Left internal carotid artery angiograms with frontal and lateral views of the head showed complete recanalization of the left MCA vascular tree. The guide catheter was retracted into the neck. Frontal and lateral angiograms of the neck were  obtained. Improvement of the degree of stenosis in the carotid bulb compared to prior CT angiogram. There is prominent luminal irregularity at the carotid bulb residual moderate stenosis. Increased tortuosity of the proximal/mid cervical left ICA with kinking. Left internal carotid artery angiograms with left anterior oblique views of the neck showed evidence of filling defects at the level of the carotid bulb. Flat panel CT of the head was obtained and post processed in a separate workstation with concurrent attending physician supervision. Selected images were sent to PACS. No evidence of hemorrhagic complication. There is mild contrast staining of the left insular and frontal cortex. Repeat left internal carotid artery angiograms with frontal and lateral views of the head showed Amy seen left M3/MCA branch to the left parietal region. Left common carotid artery angiograms with frontal and lateral views of the neck showed vertebra Gretchen of stenosis at the left ICA bulb with more prominent filling defect. At this point, patient was loaded on cangrelor followed by continuous drip. Using biplane roadmap guidance, a 4-7 mm Emboshield NAV6 cerebral protection device was advanced into the cervical left ICA. However, multiple attempts to advance a cerebral protection device through the left ICA kinking proved unsuccessful. The cerebral protection device was subsequently removed. Using biplane roadmap guidance, a 10-8 x 40 mm XACT carotid stent was navigated and deployed from the distal left common carotid artery to the proximal left internal carotid artery, proximal to the vessel kinking. Suboptimal stent expansion was noted. Then, a 6 x 30 mm Viatrac balloon was navigated into the recently deployed stent. Angioplasty was performed under fluoroscopy. Left internal carotid artery angiograms with frontal and lateral views of the neck showed adequate stent positioning and expansion with brisk anterograde flow. Left  internal carotid artery angiograms with frontal and lateral views of the head showed improvement of anterograde flow in the left MCA vascular tree with brisk anterograde flow. Delayed left common carotid artery angiograms with frontal and lateral views of the neck showed no evidence of clot formation within the stent. The catheter was subsequently Riddle. Right common femoral artery angiogram was obtained in right anterior oblique view. The puncture is at the level of the common femoral artery. The artery has normal caliber, adequate for closure device. The sheath was exchanged over the wire for an 8 [place] Angio-Seal which was utilized for access closure. Immediate hemostasis was achieved. IMPRESSION: 1. Successful mechanical thrombectomy for treatment of a proximal left M2/MCA anterior division branch occlusion achieving complete recanalization (TICI 3). 2. 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 PLAN: Continue cangrelor infusion until patient is transitioned to oral dual antiplatelet therapy. Electronically Signed   By: Baldemar Lenis M.D.   On: 06/05/2023 13:16   IR US Guide Vasc Access Right Result Date: 06/05/2023 INDICATION: 83 year old male presenting with right-sided weakness and aphasia; NIHSS 28. His last known well was 10 p.m. on 06/04/2023. His past medical history significant for prior stroke, hypertension, diabetes and chronic kidney disease; baseline modified Rankin scale 0. Head CT showed hypodensity within the left insula, basal ganglia and left frontal operculum (ASPECTS 7). No IV thrombolytic given as patient was outside the window. CT angiogram of the head and neck showed an occlusion of a left M2/MCA anterior division branch. CT perfusion showed a 41 mL core infarct with a 45 mL ischemic penumbra. She was transferred to our service  for mechanical thrombectomy. EXAM: ULTRASOUND-GUIDED VASCULAR ACCESS DIAGNOSTIC CEREBRAL ANGIOGRAM MECHANICAL THROMBECTOMY FLAT PANEL HEAD CT LEFT CAROTID STENTING AND ANGIOPLASTY WITHOUT CEREBRAL PROTECTION DEVICE COMPARISON:  CT/CT angiogram of the head and neck June 05, 2023. MEDICATIONS: No antibiotics administered. ANESTHESIA/SEDATION: The procedure was performed under general anesthesia. CONTRAST:  80 mL of Omnipaque 300 milligram/mL FLUOROSCOPY: Radiation Exposure Index (as provided by the fluoroscopic device): 1315 mGy Kerma COMPLICATIONS: None immediate. TECHNIQUE: Informed written consent was obtained from the patient's wife after a thorough discussion of the procedural risks, benefits and alternatives. All questions were addressed. Maximal Sterile Barrier Technique was utilized including caps, mask, sterile gowns, sterile gloves, sterile drape, hand hygiene and skin antiseptic. A timeout was performed prior to the initiation of the procedure. The right groin was prepped and draped in the usual sterile fashion. Using a micropuncture kit and the modified Seldinger technique, access was gained to the right common femoral artery and an 8 French sheath was placed. Real-time ultrasound guidance was utilized for vascular access including the acquisition of a permanent ultrasound image documenting patency of the accessed vessel. Under fluoroscopy, an 8 [place] Walrus balloon guide catheter was navigated over a 6 [place] VTK catheter and a 0.035" Terumo Glidewire into the aortic arch. The catheter was placed into the left common carotid artery and then advanced into the left internal carotid artery. The diagnostic catheter was removed. Frontal and lateral angiograms of the head were obtained. FINDINGS: 1. Ultrasound showed heavily calcified right common femoral artery is maintained patency and caliber. 2. Proximal occlusion of a left M2/MCA anterior division branch. 3. Atherosclerotic changes of the intracranial  left ICA with mild stenosis at the distal cavernous segment. 4. 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 PROCEDURE: Using biplane roadmap guidance, a Red 62 aspiration catheter was navigated over Colossus 35 microguidewire into the cavernous segment of the left ICA. The aspiration catheter was then advanced to the level of occlusion and connected to an aspiration pump. Continuous aspiration was performed for 2 minutes. The guide catheter was connected to a VacLok syringe and the guiding catheter balloon was inflated. The aspiration catheter was subsequently removed under constant aspiration. The guide catheter was aspirated for debris. Left internal carotid artery angiograms with frontal and lateral views of the head showed complete recanalization of the left MCA vascular tree. The guide catheter was retracted into the neck. Frontal and lateral angiograms of the neck were obtained. Improvement of the degree of stenosis in the carotid bulb compared to prior CT angiogram. There is prominent luminal irregularity at the carotid  bulb residual moderate stenosis. Increased tortuosity of the proximal/mid cervical left ICA with kinking. Left internal carotid artery angiograms with left anterior oblique views of the neck showed evidence of filling defects at the level of the carotid bulb. Flat panel CT of the head was obtained and post processed in a separate workstation with concurrent attending physician supervision. Selected images were sent to PACS. No evidence of hemorrhagic complication. There is mild contrast staining of the left insular and frontal cortex. Repeat left internal carotid artery angiograms with frontal and lateral views of the head showed Amy seen left M3/MCA branch to the left parietal region. Left common carotid artery angiograms with frontal and lateral views of the neck showed vertebra Gretchen  of stenosis at the left ICA bulb with more prominent filling defect. At this point, patient was loaded on cangrelor followed by continuous drip. Using biplane roadmap guidance, a 4-7 mm Emboshield NAV6 cerebral protection device was advanced into the cervical left ICA. However, multiple attempts to advance a cerebral protection device through the left ICA kinking proved unsuccessful. The cerebral protection device was subsequently removed. Using biplane roadmap guidance, a 10-8 x 40 mm XACT carotid stent was navigated and deployed from the distal left common carotid artery to the proximal left internal carotid artery, proximal to the vessel kinking. Suboptimal stent expansion was noted. Then, a 6 x 30 mm Viatrac balloon was navigated into the recently deployed stent. Angioplasty was performed under fluoroscopy. Left internal carotid artery angiograms with frontal and lateral views of the neck showed adequate stent positioning and expansion with brisk anterograde flow. Left internal carotid artery angiograms with frontal and lateral views of the head showed improvement of anterograde flow in the left MCA vascular tree with brisk anterograde flow. Delayed left common carotid artery angiograms with frontal and lateral views of the neck showed no evidence of clot formation within the stent. The catheter was subsequently Riddle. Right common femoral artery angiogram was obtained in right anterior oblique view. The puncture is at the level of the common femoral artery. The artery has normal caliber, adequate for closure device. The sheath was exchanged over the wire for an 8 [place] Angio-Seal which was utilized for access closure. Immediate hemostasis was achieved. IMPRESSION: 1. Successful mechanical thrombectomy for treatment of a proximal left M2/MCA anterior division branch occlusion achieving complete recanalization (TICI 3). 2. Atherosclerotic disease of the left carotid bifurcation with stenosis and clot formation  suggesting acute plaque rupture treated with stenting and angioplasty with resolution of stenosis. PLAN: Continue cangrelor infusion until patient is transitioned to oral dual antiplatelet therapy. Electronically Signed   By: Baldemar Lenis M.D.   On: 06/05/2023 13:16   IR INTRAVSC STENT CERV CAROTID W/O EMB-PROT MOD SED Result Date: 06/05/2023 INDICATION: 83 year old male presenting with right-sided weakness and aphasia; NIHSS 28. His last known well was 10 p.m. on 06/04/2023. His past medical history significant for prior stroke, hypertension, diabetes and chronic kidney disease; baseline modified Rankin scale 0. Head CT showed hypodensity within the left insula, basal ganglia and left frontal operculum (ASPECTS 7). No IV thrombolytic given as patient was outside the window. CT angiogram of the head and neck showed an occlusion of a left M2/MCA anterior division branch. CT perfusion showed a 41 mL core infarct with a 45 mL ischemic penumbra. She was transferred to our service for mechanical thrombectomy. EXAM: ULTRASOUND-GUIDED VASCULAR ACCESS DIAGNOSTIC CEREBRAL ANGIOGRAM MECHANICAL THROMBECTOMY FLAT PANEL HEAD CT LEFT CAROTID STENTING AND ANGIOPLASTY WITHOUT CEREBRAL PROTECTION  DEVICE COMPARISON:  CT/CT angiogram of the head and neck June 05, 2023. MEDICATIONS: No antibiotics administered. ANESTHESIA/SEDATION: The procedure was performed under general anesthesia. CONTRAST:  80 mL of Omnipaque 300 milligram/mL FLUOROSCOPY: Radiation Exposure Index (as provided by the fluoroscopic device): 1315 mGy Kerma COMPLICATIONS: None immediate. TECHNIQUE: Informed written consent was obtained from the patient's wife after a thorough discussion of the procedural risks, benefits and alternatives. All questions were addressed. Maximal Sterile Barrier Technique was utilized including caps, mask, sterile gowns, sterile gloves, sterile drape, hand hygiene and skin antiseptic. A timeout was performed prior to  the initiation of the procedure. The right groin was prepped and draped in the usual sterile fashion. Using a micropuncture kit and the modified Seldinger technique, access was gained to the right common femoral artery and an 8 French sheath was placed. Real-time ultrasound guidance was utilized for vascular access including the acquisition of a permanent ultrasound image documenting patency of the accessed vessel. Under fluoroscopy, an 8 [place] Walrus balloon guide catheter was navigated over a 6 [place] VTK catheter and a 0.035" Terumo Glidewire into the aortic arch. The catheter was placed into the left common carotid artery and then advanced into the left internal carotid artery. The diagnostic catheter was removed. Frontal and lateral angiograms of the head were obtained. FINDINGS: 1. Ultrasound showed heavily calcified right common femoral artery is maintained patency and caliber. 2. Proximal occlusion of a left M2/MCA anterior division branch. 3. Atherosclerotic changes of the intracranial left ICA with mild stenosis at the distal cavernous segment. 4. 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 PROCEDURE: Using biplane roadmap guidance, a Red 62 aspiration catheter was navigated over Colossus 35 microguidewire into the cavernous segment of the left ICA. The aspiration catheter was then advanced to the level of occlusion and connected to an aspiration pump. Continuous aspiration was performed for 2 minutes. The guide catheter was connected to a VacLok syringe and the guiding catheter balloon was inflated. The aspiration catheter was subsequently removed under constant aspiration. The guide catheter was aspirated for debris. Left internal carotid artery angiograms with frontal and lateral views of the head showed complete recanalization of the left MCA vascular tree. The guide catheter was retracted into the  neck. Frontal and lateral angiograms of the neck were obtained. Improvement of the degree of stenosis in the carotid bulb compared to prior CT angiogram. There is prominent luminal irregularity at the carotid bulb residual moderate stenosis. Increased tortuosity of the proximal/mid cervical left ICA with kinking. Left internal carotid artery angiograms with left anterior oblique views of the neck showed evidence of filling defects at the level of the carotid bulb. Flat panel CT of the head was obtained and post processed in a separate workstation with concurrent attending physician supervision. Selected images were sent to PACS. No evidence of hemorrhagic complication. There is mild contrast staining of the left insular and frontal cortex. Repeat left internal carotid artery angiograms with frontal and lateral views of the head showed Amy seen left M3/MCA branch to the left parietal region. Left common carotid artery angiograms with frontal and lateral views of the neck showed vertebra Gretchen of stenosis at the left ICA bulb with more prominent filling defect. At this point, patient was loaded on cangrelor followed by continuous drip. Using biplane roadmap guidance, a 4-7 mm Emboshield NAV6 cerebral protection device was advanced into the cervical left ICA. However, multiple  attempts to advance a cerebral protection device through the left ICA kinking proved unsuccessful. The cerebral protection device was subsequently removed. Using biplane roadmap guidance, a 10-8 x 40 mm XACT carotid stent was navigated and deployed from the distal left common carotid artery to the proximal left internal carotid artery, proximal to the vessel kinking. Suboptimal stent expansion was noted. Then, a 6 x 30 mm Viatrac balloon was navigated into the recently deployed stent. Angioplasty was performed under fluoroscopy. Left internal carotid artery angiograms with frontal and lateral views of the neck showed adequate stent positioning  and expansion with brisk anterograde flow. Left internal carotid artery angiograms with frontal and lateral views of the head showed improvement of anterograde flow in the left MCA vascular tree with brisk anterograde flow. Delayed left common carotid artery angiograms with frontal and lateral views of the neck showed no evidence of clot formation within the stent. The catheter was subsequently Riddle. Right common femoral artery angiogram was obtained in right anterior oblique view. The puncture is at the level of the common femoral artery. The artery has normal caliber, adequate for closure device. The sheath was exchanged over the wire for an 8 [place] Angio-Seal which was utilized for access closure. Immediate hemostasis was achieved. IMPRESSION: 1. Successful mechanical thrombectomy for treatment of a proximal left M2/MCA anterior division branch occlusion achieving complete recanalization (TICI 3). 2. 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 PLAN: Continue cangrelor infusion until patient is transitioned to oral dual antiplatelet therapy. Electronically Signed   By: Baldemar Lenis M.D.   On: 06/05/2023 13:16   IR CT Head Ltd Result Date: 06/05/2023 INDICATION: 83 year old male presenting with right-sided weakness and aphasia; NIHSS 28. His last known well was 10 p.m. on 06/04/2023. His past medical history significant for prior stroke, hypertension, diabetes and chronic kidney disease; baseline modified Rankin scale 0. Head CT showed hypodensity within the left insula, basal ganglia and left frontal operculum (ASPECTS 7). No IV thrombolytic given as patient was outside the window. CT angiogram of the head and neck showed an occlusion of a left M2/MCA anterior division branch. CT perfusion showed a 41 mL core infarct with a 45 mL ischemic penumbra.  She was transferred to our service for mechanical thrombectomy. EXAM: ULTRASOUND-GUIDED VASCULAR ACCESS DIAGNOSTIC CEREBRAL ANGIOGRAM MECHANICAL THROMBECTOMY FLAT PANEL HEAD CT LEFT CAROTID STENTING AND ANGIOPLASTY WITHOUT CEREBRAL PROTECTION DEVICE COMPARISON:  CT/CT angiogram of the head and neck June 05, 2023. MEDICATIONS: No antibiotics administered. ANESTHESIA/SEDATION: The procedure was performed under general anesthesia. CONTRAST:  80 mL of Omnipaque 300 milligram/mL FLUOROSCOPY: Radiation Exposure Index (as provided by the fluoroscopic device): 1315 mGy Kerma COMPLICATIONS: None immediate. TECHNIQUE: Informed written consent was obtained from the patient's wife after a thorough discussion of the procedural risks, benefits and alternatives. All questions were addressed. Maximal Sterile Barrier Technique was utilized including caps, mask, sterile gowns, sterile gloves, sterile drape, hand hygiene and skin antiseptic. A timeout was performed prior to the initiation of the procedure. The right groin was prepped and draped in the usual sterile fashion. Using a micropuncture kit and the modified Seldinger technique, access was gained to the right common femoral artery and an 8 French sheath was placed. Real-time ultrasound guidance was utilized for vascular access including the acquisition of a permanent ultrasound image documenting patency of the accessed vessel. Under fluoroscopy, an 8 [place] Walrus balloon guide catheter  was navigated over a 6 [place] VTK catheter and a 0.035" Terumo Glidewire into the aortic arch. The catheter was placed into the left common carotid artery and then advanced into the left internal carotid artery. The diagnostic catheter was removed. Frontal and lateral angiograms of the head were obtained. FINDINGS: 1. Ultrasound showed heavily calcified right common femoral artery is maintained patency and caliber. 2. Proximal occlusion of a left M2/MCA anterior division branch. 3.  Atherosclerotic changes of the intracranial left ICA with mild stenosis at the distal cavernous segment. 4. 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 PROCEDURE: Using biplane roadmap guidance, a Red 62 aspiration catheter was navigated over Colossus 35 microguidewire into the cavernous segment of the left ICA. The aspiration catheter was then advanced to the level of occlusion and connected to an aspiration pump. Continuous aspiration was performed for 2 minutes. The guide catheter was connected to a VacLok syringe and the guiding catheter balloon was inflated. The aspiration catheter was subsequently removed under constant aspiration. The guide catheter was aspirated for debris. Left internal carotid artery angiograms with frontal and lateral views of the head showed complete recanalization of the left MCA vascular tree. The guide catheter was retracted into the neck. Frontal and lateral angiograms of the neck were obtained. Improvement of the degree of stenosis in the carotid bulb compared to prior CT angiogram. There is prominent luminal irregularity at the carotid bulb residual moderate stenosis. Increased tortuosity of the proximal/mid cervical left ICA with kinking. Left internal carotid artery angiograms with left anterior oblique views of the neck showed evidence of filling defects at the level of the carotid bulb. Flat panel CT of the head was obtained and post processed in a separate workstation with concurrent attending physician supervision. Selected images were sent to PACS. No evidence of hemorrhagic complication. There is mild contrast staining of the left insular and frontal cortex. Repeat left internal carotid artery angiograms with frontal and lateral views of the head showed Amy seen left M3/MCA branch to the left parietal region. Left common carotid artery angiograms with frontal and lateral  views of the neck showed vertebra Gretchen of stenosis at the left ICA bulb with more prominent filling defect. At this point, patient was loaded on cangrelor followed by continuous drip. Using biplane roadmap guidance, a 4-7 mm Emboshield NAV6 cerebral protection device was advanced into the cervical left ICA. However, multiple attempts to advance a cerebral protection device through the left ICA kinking proved unsuccessful. The cerebral protection device was subsequently removed. Using biplane roadmap guidance, a 10-8 x 40 mm XACT carotid stent was navigated and deployed from the distal left common carotid artery to the proximal left internal carotid artery, proximal to the vessel kinking. Suboptimal stent expansion was noted. Then, a 6 x 30 mm Viatrac balloon was navigated into the recently deployed stent. Angioplasty was performed under fluoroscopy. Left internal carotid artery angiograms with frontal and lateral views of the neck showed adequate stent positioning and expansion with brisk anterograde flow. Left internal carotid artery angiograms with frontal and lateral views of the head showed improvement of anterograde flow in the left MCA vascular tree with brisk anterograde flow. Delayed left common carotid artery angiograms with frontal and lateral views of the neck showed no evidence of clot formation within the stent. The catheter was subsequently Riddle. Right common femoral artery angiogram was obtained in right anterior oblique view. The  puncture is at the level of the common femoral artery. The artery has normal caliber, adequate for closure device. The sheath was exchanged over the wire for an 8 [place] Angio-Seal which was utilized for access closure. Immediate hemostasis was achieved. IMPRESSION: 1. Successful mechanical thrombectomy for treatment of a proximal left M2/MCA anterior division branch occlusion achieving complete recanalization (TICI 3). 2. 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 PLAN: Continue cangrelor infusion until patient is transitioned to oral dual antiplatelet therapy. Electronically Signed   By: Baldemar Lenis M.D.   On: 06/05/2023 13:16   CT ANGIO HEAD NECK W WO CM W PERF (CODE STROKE) Addendum Date: 06/05/2023 ADDENDUM REPORT: 06/05/2023 12:25 ADDENDUM: Please note, there is a dictation error within CTA neck impression #1, which should read: The common carotid and internal carotid arteries are patent within the neck. Atherosclerotic plaque bilaterally. Most notably, there is progressive atherosclerotic plaque about the left carotid bifurcation and within the proximal left ICA with resultant severe near occlusive stenosis of the proximal left ICA. Also of note, atherosclerotic plaque about the right carotid bifurcation results in a 40% stenosis at the right ICA origin. Electronically Signed   By: Jackey Loge D.O.   On: 06/05/2023 12:25   Result Date: 06/05/2023 CLINICAL DATA:  Provided history: Cerebrovascular accident, unspecified mechanism. Right-sided weakness. Right-sided facial droop. Altered mental status. EXAM: CT ANGIOGRAPHY HEAD AND NECK CT PERFUSION BRAIN TECHNIQUE: Multidetector CT imaging of the head and neck was performed using the standard protocol during bolus administration of intravenous contrast. Multiplanar CT image reconstructions and MIPs were obtained to evaluate the vascular anatomy. Carotid stenosis measurements (when applicable) are obtained utilizing NASCET criteria, using the distal internal carotid diameter as the denominator. Multiphase CT imaging of the brain was performed following IV bolus contrast injection. Subsequent parametric perfusion maps were calculated using RAPID software. RADIATION DOSE REDUCTION: This exam was performed according to the departmental dose-optimization program  which includes automated exposure control, adjustment of the mA and/or kV according to patient size and/or use of iterative reconstruction technique. CONTRAST:  OMNIPAQUE IOHEXOL 350 MG/ML SOLN COMPARISON:  Noncontrast head CT performed earlier today 06/05/2023. MRA head and MRA neck 10/04/2019. FINDINGS: CTA NECK FINDINGS Aortic arch: Common origin of the innominate and left common carotid arteries. Atherosclerotic plaque within the visualized thoracic aorta and proximal major branch vessels of the neck. Streak/beam hardening artifact arising from a dense right-sided contrast bolus partially obscures the right subclavian artery. Within this limitation, there is no appreciable hemodynamically significant innominate or proximal subclavian artery stenosis. Right carotid system: CCA and ICA patent within the neck. Atherosclerotic plaque, greatest about the carotid bifurcation. Resultant 40% stenosis at the ICA origin. Left carotid system: CCA and ICA patent within the neck. Atherosclerotic plaque. Most notably, there is prominent atherosclerotic plaque about the carotid bifurcation and within the proximal ICA which has progressed from the prior MRA neck of 10/04/2019. Resultant severe (near occlusive) stenosis of the proximal ICA. Tortuosity of the cervical ICA Vertebral arteries: The vertebral arteries are patent within the neck. Streak/beam hardening artifact limits evaluation of the right vertebral artery origin. At least moderate stenosis is suspected at this site. Atherosclerotic plaque scattered elsewhere within the cervical right vertebral artery with no more than mild stenosis. Calcified atherosclerotic plaque at the left vertebral artery origin with suspected at least moderate stenosis. Nonstenotic calcified plaque elsewhere within  the cervical left vertebral artery. Skeleton: Cervical spondylosis. Other neck: No neck mass or cervical lymphadenopathy. Upper chest: No consolidation within the imaged lung  apices. Review of the MIP images confirms the above findings CTA HEAD FINDINGS Anterior circulation: The intracranial internal carotid arteries are patent. As sclerotic plaque within both vessels. No more than mild stenosis on the right. Up to moderate stenosis within the left cavernous segment. The M1 middle cerebral arteries are patent. Abrupt occlusion of a proximal M2 left middle cerebral artery vessel (series 11, image 22). Atherosclerotic irregularity of the M2 and more distal MCA vessels elsewhere. The anterior cerebral arteries are patent. Atherosclerotic irregularity of both vessels without high-grade proximal stenosis. A possible 2 mm periophthalmic left ICA aneurysm with better appreciated on the prior MRA head of 10/04/2019. Posterior circulation: The intracranial vertebral arteries are patent. Atherosclerotic plaque within the right V4 segment sites of mild stenosis. Non-stenotic atherosclerotic plaque within the left V4 segment. The basilar artery is patent. The posterior cerebral arteries are patent. Posterior communicating arteries are diminutive or absent, bilaterally. Venous sinuses: Assessment for dural venous sinus thrombosis is limited due to contrast timing. Anatomic variants: As described. Review of the MIP images confirms the above findings CT Brain Perfusion Findings: ASPECTS: CBF (<30%) Volume: 41mL Perfusion (Tmax>6.0s) volume: 86mL Mismatch Volume: 45mL Infarction Location:Left MCA vascular territory CTA head impression #1, the CT perfusion head impression and the presence of a severe stenosis of the proximal cervical left ICA called by telephone at the time of interpretation on 06/05/2023 at 8:40 am to provider ERIC [facility] , who verbally acknowledged these results. IMPRESSION: CTA neck: 1. No common carotid and internal carotid arteries are patent within the neck. Atherosclerotic plaque bilaterally. Most notably, there is progressive atherosclerotic plaque about the left carotid  bifurcation and within the proximal left ICA with resultant severe, near occlusive stenosis of the proximal left ICA. Also of note, atherosclerotic plaque about the right carotid bifurcation results in 40% stenosis at the right ICA origin. 2. The vertebral arteries are patent within the neck. Atherosclerotic plaque bilaterally as described. Most notably, there is suspected at least moderate stenoses at the bilateral vertebral artery origins. 3. Aortic Atherosclerosis (ICD10-I70.0). CTA head: 1. Abrupt occlusion of a proximal M2 left middle cerebral artery vessel. 2. Background intracranial atherosclerotic disease as described. 3. A possible 2 mm periophthalmic left ICA aneurysm was better appreciated on the prior MRA head of 10/04/2019. CT perfusion head: The perfusion software identifies a 41 mL core infarct in the left MCA vascular territory. The perfusion software identifies an 86 mL region of critically hypoperfused parenchyma within the left MCA vascular territory (utilizing the Tmax>6 seconds threshold). Reported mismatch volume: 45 mL Electronically Signed: By: Jackey Loge D.O. On: 06/05/2023 09:07   CT HEAD CODE STROKE WO CONTRAST Result Date: 06/05/2023 CLINICAL DATA:  Code stroke. Neuro deficit, acute, stroke suspected. EXAM: CT HEAD WITHOUT CONTRAST TECHNIQUE: Contiguous axial images were obtained from the base of the skull through the vertex without intravenous contrast. RADIATION DOSE REDUCTION: This exam was performed according to the departmental dose-optimization program which includes automated exposure control, adjustment of the mA and/or kV according to patient size and/or use of iterative reconstruction technique. COMPARISON:  Brain MRI 10/04/2019.  Noncontrast head CT 10/03/2019. FINDINGS: Brain: Generalized cerebral atrophy. Loss of gray-white differentiation consistent with an acute infarct within the left insula and within portions of the left frontal operculum (MCA vascular territory).  Known small chronic cortically-based infarcts within the left frontal, left  parietal and left occipital lobes were better appreciated on the prior brain MRI of 10/04/2019 (acute at that time). Mild patchy and ill-defined hypoattenuation within the cerebral white matter, nonspecific but compatible with chronic small vessel ischemic disease. Subcentimeter infarct within the superior right cerebellar hemisphere, new from the prior MRI but chronic in appearance. Loss of gray-white differentiation there is no acute intracranial hemorrhage. No extra-axial fluid collection. No evidence of an intracranial mass. No midline shift. Vascular: No hyperdense vessel.  Atherosclerotic calcifications. Skull: No calvarial fracture or aggressive osseous lesion. Sinuses/Orbits: No mass or acute finding within the imaged orbits. No significant paranasal sinus disease. ASPECTS [facility] Stroke Program Early CT Score) - Ganglionic level infarction (caudate, lentiform nuclei, internal capsule, insula, M1-M3 cortex): 5 - Supraganglionic infarction (M4-M6 cortex): 2 Total score (0-10 with 10 being normal): 7 Impression #1 called by telephone at the time of interpretation on 06/05/2023 at 8:40 am to provider Dr. Otelia Limes, who verbally acknowledged these results. IMPRESSION: 1. Acute left MCA territory infarct affecting the left insula and portions of the left frontal operculum. ASPECTS is 7. 2. Known small chronic cortically-based infarcts within the left frontal, left parietal and left occipital lobes were better appreciated on the prior brain MRI of 10/04/2019 (acute at that time). 3. Background mild cerebral white matter chronic small vessel ischemic disease. 4. Subcentimeter infarct within the right cerebellar hemisphere, new from prior MRI but chronic in appearance. 5. Generalized cerebral atrophy. Electronically Signed   By: Jackey Loge D.O.   On: 06/05/2023 08:45     PHYSICAL EXAM  Temp:  [97.2 F (36.2 C)-102.5 F (39.2 C)] 102.5  F (39.2 C) (02/19 2000) Pulse Rate:  [55-123] 94 (02/19 2100) Resp:  [0-36] 0 (02/19 2100) BP: (112-151)/(41-73) 122/52 (02/19 2100) SpO2:  [95 %-100 %] 98 % (02/19 2100) FiO2 (%):  [35 %] 35 % (02/19 2000) Weight:  [133.4 kg] 133.4 kg (02/19 0500)  General -intubated elderly patient in no acute distress  Cardiovascular -regular rhythm on monitor  Neuro - still intubated on vent, eyes closed and not open eyes on voice, with forced eye opening, eyes midline with mild upward gaze, not blinking to visual threat bilaterally, pupils equal and round but small and sluggish, corneal reflexes present, cough and gag reflex present.  Patient does not respond to voice or follow commands.  No significant response to noxious stimuli and no spontaneous movement.   ASSESSMENT/PLAN Nathan Proctor. is a 83 y.o. male with history of hypertension, diabetes, CKD 3, stroke admitted for right-sided weakness numbness, aphasia, right facial droop, left gaze preference. No TNK given due to outside window.  Patient underwent mechanical thrombectomy with left ICA stenting.   2/15 in the morning patient had an episode of vomiting with coffee-ground emesis and subsequent tachycardia and tachypnea.  On evaluation this morning, he had increased work of breathing and was unable to maintain his SpO2 even on 100% O2 via nonrebreather.  Discussion was had with patient's wife and daughter, and family stated they would like to proceed with intubation and would like for patient to remain full code at this time.  CCM was consulted, and patient was transferred to ICU and intubated.  He did have an episode of aspiration during intubation and bronchoscopy was subsequently performed.  He was noted to be hypotensive after intubation, and Levophed and vasopressin were started.  Will hold Plavix for now given coffee-ground emesis.  Patient's creatinine was also noted to have worsened.  Stroke:  left  MCA infarct with left M2  occlusion and left ICA near occlusion s/p IR with TICI3 and left ICA stenting, likely secondary to large vessel disease source versus cardiomyopathy with low EF CT left MCA infarct CT head and neck left M2 occlusion, left ICA near occlusion, right ICA 43 stenosis, bilateral VA origin severe stenosis CTP 41/86 Status post IR with TICI3 and left ICA stenting MRI left MCA infarct at left insular and left frontal operculum, prominent and confluent petechial hemorrhagic transformation CT repeat 2/7 stable confluent petechial hemorrhage 2D Echo EF 30% LDL 107 HgbA1c 6.1 P2 Y12 = 73 UDS negative SCDs for VTE prophylaxis aspirin 81 mg daily and clopidogrel 75 mg daily prior to admission, now on Plavix with stable anemia. Add ASA 81 in am if Hb stable Ongoing aggressive stroke risk factor management Therapy recommendations:  SNF Disposition: Pending, continue palliative care for GOC discussion  History of stroke 10/08/2019 admitted for left MCA infarct due to right upper extremity weakness.  MRA head and neck showed left ICA 30% stenosis.  EF 45 to 50%.  LDL 105, A1c 5.6.  Recommended loop recorder at that time but only got 30-day CardioNet monitoring which was no A-fib.  Discharged on DAPT and Lipitor 80.  Respiratory failure Leukocytosis On 2/15, patient had episode of emesis with probable subsequent aspiration and went into respiratory distress with increased work of breathing unable to maintain SpO2 After discussion with family, patient intubated for airway protection and given respiratory failure Now on precedex and fentanyl Ventilator management per CCM - failed SBT 2/19 Fever Tmax 102.5 WBC 10.1--23.5--44.5--41.4--29.9--19.5--7.4--4.9 Now on cefepime-> Rocephin Off vancomycin  GI bleeding Acute blood loss anemia Patient had 2 episodes of coffee-ground emesis, likely stress ulcer Resume Plavix 2/18 Will resume ASA 2/20 if Hb stable Hemoglobin 11.2-> 8.4->PRBC->8.8->8.1->  7.6->8.3 Close monitoring  Cardiomyopathy CHF 09/2019 EF 45 to 50% Current admission EF 30% Cardiology on board, appreciate assistance Was on DAPT, now on hold due to GIB Agree with GDMT (losartan, entresto, metoprolol and spironolactone) as BP and Cr tolerates May consider loop recorder placement if neuro improves significantly  Diabetes HgbA1c 6.1 goal < 7.0 Hyperglycemia improved  Now off insulin drip CBG monitoring SSI  DM education and close PCP follow up  History of hypertension, now hypotensive Stable now But requiring Levophed Off epi and vasopressin Long term BP goal normotensive  Hyperlipidemia Home meds: Lipitor 80 LDL 105, goal < 70 Now on lipitor 80 and Zetia  Continue statin and zetia at discharge  Dysphagia Poststroke dysphagia Speech on board N.p.o. now OG tube reinserted Start trickle feeds 2/18  AKI on CKD  Creatinine 1.78->3.41-> 4.61->4.07-> 3.85->3.66 Baseline creatinine around 1.5 Renally dose medications as appropriate Avoid contrast Aggressive treat hypotension  Other Stroke Risk Factors Advanced age Obesity, Body mass index is 42.2 kg/m.   Other Active Problems Presyncopal episode with mild hypotension 2/14-suspect was due to straining to have a bowel movement, will prescribe as needed suppository  Hospital day # 14    Marvel Plan, MD PhD Stroke Neurology 06/19/2023 10:12 PM  This patient is critically ill due to stroke status post IR, respiratory failure, hypotension needing pressor, AKI, aspiration pneumonia and GI bleeding and at significant risk of neurological worsening, death form sepsis, septic shock, severe anemia, heart failure and renal failure. This patient's care requires constant monitoring of vital signs, hemodynamics, respiratory and cardiac monitoring, review of multiple databases, neurological assessment, discussion with family, other specialists and medical decision making of high complexity. I  spent 35 minutes of  neurocritical care time in the care of this patient.

## 2023-06-20 DIAGNOSIS — K567 Ileus, unspecified: Secondary | ICD-10-CM | POA: Diagnosis not present

## 2023-06-20 DIAGNOSIS — Z7189 Other specified counseling: Secondary | ICD-10-CM | POA: Diagnosis not present

## 2023-06-20 DIAGNOSIS — I639 Cerebral infarction, unspecified: Secondary | ICD-10-CM | POA: Diagnosis not present

## 2023-06-20 DIAGNOSIS — J150 Pneumonia due to Klebsiella pneumoniae: Secondary | ICD-10-CM | POA: Diagnosis not present

## 2023-06-20 DIAGNOSIS — J9601 Acute respiratory failure with hypoxia: Secondary | ICD-10-CM | POA: Diagnosis not present

## 2023-06-20 DIAGNOSIS — Z515 Encounter for palliative care: Secondary | ICD-10-CM | POA: Diagnosis not present

## 2023-06-20 LAB — CULTURE, BLOOD (ROUTINE X 2)
Culture: NO GROWTH
Culture: NO GROWTH

## 2023-06-20 LAB — CBC
HCT: 28 % — ABNORMAL LOW (ref 39.0–52.0)
Hemoglobin: 8.6 g/dL — ABNORMAL LOW (ref 13.0–17.0)
MCH: 30.9 pg (ref 26.0–34.0)
MCHC: 30.7 g/dL (ref 30.0–36.0)
MCV: 100.7 fL — ABNORMAL HIGH (ref 80.0–100.0)
Platelets: 113 10*3/uL — ABNORMAL LOW (ref 150–400)
RBC: 2.78 MIL/uL — ABNORMAL LOW (ref 4.22–5.81)
RDW: 13.9 % (ref 11.5–15.5)
WBC: 5.7 10*3/uL (ref 4.0–10.5)
nRBC: 0 % (ref 0.0–0.2)

## 2023-06-20 LAB — BASIC METABOLIC PANEL
Anion gap: 14 (ref 5–15)
BUN: 102 mg/dL — ABNORMAL HIGH (ref 8–23)
CO2: 21 mmol/L — ABNORMAL LOW (ref 22–32)
Calcium: 8.9 mg/dL (ref 8.9–10.3)
Chloride: 114 mmol/L — ABNORMAL HIGH (ref 98–111)
Creatinine, Ser: 4.19 mg/dL — ABNORMAL HIGH (ref 0.61–1.24)
GFR, Estimated: 13 mL/min — ABNORMAL LOW (ref >60)
Glucose, Bld: 186 mg/dL — ABNORMAL HIGH (ref 70–99)
Potassium: 3.8 mmol/L (ref 3.5–5.1)
Sodium: 149 mmol/L — ABNORMAL HIGH (ref 135–145)

## 2023-06-20 LAB — MAGNESIUM
Magnesium: 2.4 mg/dL (ref 1.7–2.4)
Magnesium: 2.3 mg/dL (ref 1.7–2.4)

## 2023-06-20 LAB — GLUCOSE, CAPILLARY
Glucose-Capillary: 195 mg/dL — ABNORMAL HIGH (ref 70–99)
Glucose-Capillary: 145 mg/dL — ABNORMAL HIGH (ref 70–99)
Glucose-Capillary: 195 mg/dL — ABNORMAL HIGH (ref 70–99)
Glucose-Capillary: 229 mg/dL — ABNORMAL HIGH (ref 70–99)
Glucose-Capillary: 255 mg/dL — ABNORMAL HIGH (ref 70–99)
Glucose-Capillary: 265 mg/dL — ABNORMAL HIGH (ref 70–99)

## 2023-06-20 LAB — COOXEMETRY PANEL
Carboxyhemoglobin: 2.3 % — ABNORMAL HIGH (ref 0.5–1.5)
Methemoglobin: <0.7 % (ref 0.0–1.5)
O2 Saturation: 83.5 %
Total hemoglobin: 8.3 g/dL — ABNORMAL LOW (ref 12.0–16.0)

## 2023-06-20 LAB — BRAIN NATRIURETIC PEPTIDE: B Natriuretic Peptide: 1337.4 pg/mL — ABNORMAL HIGH (ref 0.0–100.0)

## 2023-06-20 LAB — PHOSPHORUS: Phosphorus: 4.6 mg/dL (ref 2.5–4.6)

## 2023-06-20 MED ORDER — FUROSEMIDE 10 MG/ML IJ SOLN
100.0000 mg | Freq: Once | INTRAVENOUS | Status: AC
  Administered 2023-06-20: 100 mg via INTRAVENOUS
  Filled 2023-06-20: qty 10

## 2023-06-20 MED ORDER — HEPARIN SODIUM (PORCINE) 5000 UNIT/ML IJ SOLN
5000.0000 [IU] | Freq: Three times a day (TID) | INTRAMUSCULAR | Status: DC
  Administered 2023-06-20 – 2023-06-27 (×21): 5000 [IU] via SUBCUTANEOUS
  Filled 2023-06-20 (×22): qty 1

## 2023-06-20 MED ORDER — FREE WATER
300.0000 mL | Status: DC
  Administered 2023-06-20 – 2023-06-23 (×18): 300 mL

## 2023-06-20 MED ORDER — FUROSEMIDE 10 MG/ML IJ SOLN
40.0000 mg | Freq: Once | INTRAMUSCULAR | Status: AC
  Administered 2023-06-20: 40 mg via INTRAVENOUS
  Filled 2023-06-20: qty 4

## 2023-06-20 MED ORDER — SENNOSIDES-DOCUSATE SODIUM 8.6-50 MG PO TABS
1.0000 | ORAL_TABLET | Freq: Two times a day (BID) | ORAL | Status: DC
  Administered 2023-06-20 – 2023-06-21 (×3): 1
  Filled 2023-06-20 (×4): qty 1

## 2023-06-20 MED ORDER — POLYETHYLENE GLYCOL 3350 17 G PO PACK
17.0000 g | PACK | Freq: Two times a day (BID) | ORAL | Status: DC
  Administered 2023-06-20 – 2023-06-22 (×4): 17 g
  Filled 2023-06-20 (×4): qty 1

## 2023-06-20 MED ORDER — INSULIN GLARGINE-YFGN 100 UNIT/ML ~~LOC~~ SOLN
8.0000 [IU] | Freq: Two times a day (BID) | SUBCUTANEOUS | Status: DC
Start: 2023-06-20 — End: 2023-06-21
  Administered 2023-06-20 (×2): 8 [IU] via SUBCUTANEOUS
  Filled 2023-06-20 (×4): qty 0.08

## 2023-06-20 NOTE — Progress Notes (Signed)
 Nutrition Follow-up  DOCUMENTATION CODES:   Non-severe (moderate) malnutrition in context of chronic illness  INTERVENTION:   Tube feeding via OG tube: Osmolite 1.5 at 20 ml/h  As able recommend advance Osmolite 1.5 by 10 ml every 8 hours to goal rate of 65 ml/hr (1560 ml per day) Prosource TF20 60 ml BID  Provides 2500 kcal, 137 gm protein, 1188 ml free water daily  100 mg thiamine via tube Rena-vit daily  300 ml free water every 4 hours Total free water: 2988 ml   NUTRITION DIAGNOSIS:   Moderate Malnutrition related to chronic illness as evidenced by severe muscle depletion, mild fat depletion, severe fat depletion. Ongoing.   GOAL:   Patient will meet greater than or equal to 90% of their needs Progressing with TF initiation   MONITOR:   TF tolerance, Weight trends, Labs, Diet advancement  REASON FOR ASSESSMENT:   Consult Enteral/tube feeding initiation and management  ASSESSMENT:   Pt admitted for acute ischemic stroke with right sides weakness, right facial droop, and aphagia. Pt with PMH of HTN, DM, CKD III, prior CVA w/ no residual deficits.  Pt discussed during ICU rounds and with RN and MD.  Per MD planning to stop all sedation, diurese and extubate as mental status allows. Aggressive bowel regimen due to constipation.  Spoke with wife at bedside. She reports pt has hx of chronic constipation and takes medications regularly to have BMs, usually only has BM every few days.   2/05 - s/p IR mechanical thrombectomy and carotid stent placement, Cortrak placed (tip gastric) 2/07 - s/p MBS with recommendation for dysphagia 1 diet with honey-thick liquids, Transferred to progressive unit 2/09 - Cortrak and tube feeds discontinued 2/12 - repeat MBS with recommendation for dysphagia 2 diet with thin liquids 2/15 - Transferred to ICU for respiratory distress, Intubated  2/16 -  CT abdomen concerning for either close loop obstruction or internal hernia 2/18 -  start trickle TF   Medications reviewed and include: lipitor, dulcolax daily (2/19), colace BID (2/19), SSI every 4 hours, 8 units semglee BID, reglan 5 mg every 6 hours (2/18), rena-vit, protonix, miralax BID (incr 2/20), senokot-s BID (incr 2/20), thiamine  Precedex Fentanyl   Labs reviewed:  Na 149 CBG's: 145-229   OG tube; tip in distal stomach per xray   Current weight: 133 kg Admission weight: 132.6 kg   Diet Order:   Diet Order             Diet NPO time specified  Diet effective now                   EDUCATION NEEDS:   Education needs have been addressed  Skin:  Skin Assessment: Skin Integrity Issues: Skin Integrity Issues:: Stage III Stage II: L scrotum Stage III: L scrotum Incisions: R thigh  Last BM:  06/16/2023 per RN small, watery  Height:   Ht Readings from Last 1 Encounters:  06/19/23 5\' 10"  (1.778 m)    Weight:   Wt Readings from Last 1 Encounters:  06/20/23 133 kg    Ideal Body Weight:  68.2 kg  BMI:  Body mass index is 42.07 kg/m.  Estimated Nutritional Needs:   Kcal:  2300-2500  Protein:  115-130 g  Fluid:  2.4 L  Nathan Fawbush P., RD, LDN, CNSC See AMiON for contact information

## 2023-06-20 NOTE — Progress Notes (Incomplete)
STROKE TEAM PROGRESS NOTE   SUBJECTIVE (INTERVAL HISTORY)  CXR 2/19 showed worsening left opacities.   OBJECTIVE Temp:  [98.4 F (36.9 C)-102.5 F (39.2 C)] 98.6 F (37 C) (02/20 0800) Pulse Rate:  [85-123] 95 (02/20 0800) Cardiac Rhythm: Normal sinus rhythm (02/20 0400) Resp:  [0-36] 0 (02/20 0800) BP: (117-152)/(44-78) 152/65 (02/20 0800) SpO2:  [95 %-100 %] 100 % (02/20 0800) FiO2 (%):  [35 %] 35 % (02/20 0755) Weight:  [133 kg] 133 kg (02/20 0500)  Recent Labs  Lab 06/19/23 1533 06/19/23 1918 06/19/23 2315 06/20/23 0319 06/20/23 0743  GLUCAP 195* 211* 219* 195* 145*   Recent Labs  Lab 06/16/23 0414 06/16/23 1611 06/17/23 0423 06/18/23 0124 06/19/23 0031 06/19/23 2121 06/20/23 0447  NA 141   < > 143 141 144 146* 149*  K 5.3*   < > 4.6 4.2 3.6 3.8 3.8  CL 107   < > 109 107 110 112* 114*  CO2 20*   < > 20* 22 23 20* 21*  GLUCOSE 180*   < > 150* 192* 172* 257* 186*  BUN 68*   < > 67* 69* 72* 94* 102*  CREATININE 4.61*   < > 4.07* 3.85* 3.31* 3.66* 4.19*  CALCIUM 8.4*   < > 8.6* 8.3* 8.8* 8.6* 8.9  MG 2.1  --  2.1  --  2.2 2.2 2.4  PHOS  --   --  4.8*  --   --   --   --    < > = values in this interval not displayed.   Recent Labs  Lab 06/16/23 0414 06/17/23 0423  AST 44* 35  ALT 30 29  ALKPHOS 45 46  BILITOT 1.1 1.1  PROT 5.5* 5.4*  ALBUMIN 2.5* 2.2*    Recent Labs  Lab 06/17/23 1715 06/18/23 0758 06/19/23 0031 06/19/23 1544 06/20/23 0447  WBC 25.0* 19.5* 7.4 4.9 5.7  HGB 8.0* 7.6* 7.7* 8.3* 8.6*  HCT 25.1* 24.6* 25.2* 27.1* 28.0*  MCV 98.8 99.2 100.8* 100.0 100.7*  PLT 146* 157 118* 115* 113*   No results for input(s): "CKTOTAL", "CKMB", "CKMBINDEX", "TROPONINI" in the last 168 hours. No results for input(s): "LABPROT", "INR" in the last 72 hours.  No results for input(s): "COLORURINE", "LABSPEC", "PHURINE", "GLUCOSEU", "HGBUR", "BILIRUBINUR", "KETONESUR", "PROTEINUR", "UROBILINOGEN", "NITRITE", "LEUKOCYTESUR" in the last 72  hours.  Invalid input(s): "APPERANCEUR"      Component Value Date/Time   CHOL 183 06/06/2023 0557   TRIG 109 06/19/2023 0031   HDL 60 06/06/2023 0557   CHOLHDL 3.1 06/06/2023 0557   VLDL 16 06/06/2023 0557   LDLCALC 107 (H) 06/06/2023 0557   LDLCALC 223 (H) 05/31/2021 0000   Lab Results  Component Value Date   HGBA1C 6.1 (H) 06/05/2023      Component Value Date/Time   LABOPIA NONE DETECTED 06/05/2023 1653   COCAINSCRNUR NONE DETECTED 06/05/2023 1653   LABBENZ NONE DETECTED 06/05/2023 1653   AMPHETMU NONE DETECTED 06/05/2023 1653   THCU NONE DETECTED 06/05/2023 1653   LABBARB NONE DETECTED 06/05/2023 1653    No results for input(s): "ETH" in the last 168 hours.   I have personally reviewed the radiological images below and agree with the radiology interpretations.  DG CHEST PORT 1 VIEW Result Date: 06/19/2023 CLINICAL DATA:  Respiratory distress. EXAM: PORTABLE CHEST 1 VIEW COMPARISON:  Radiograph 06/15/2023, CT 06/16/2023 FINDINGS: Tip of the endotracheal tube 4.5 cm from the carina. Enteric tube and left central line unchanged in positioning. Slight improved right lung  aeration with persistent basilar opacity. Slight worsening patchy opacity at the left lung base. No pneumothorax or large pleural effusion. No pulmonary edema. Stable heart size and mediastinal contours. IMPRESSION: 1. Bibasilar opacities, improving on the right but worsening on the left. 2. Stable support apparatus. Electronically Signed   By: Narda Rutherford M.D.   On: 06/19/2023 16:02   DG Abd Portable 1V Result Date: 06/18/2023 CLINICAL DATA:  Orogastric tube placement. EXAM: PORTABLE ABDOMEN - 1 VIEW COMPARISON:  Radiographs 06/05/2023.  Abdominal CT 06/16/2023. FINDINGS: 1236 hours. Tip of the enteric tube projects over the right upper quadrant of the abdomen, likely in the distal stomach. There is a small amount contrast material within the stomach. The visualized bowel gas pattern is normal. Patient is  rotated to the right with persistent right basilar airspace disease and a small right pleural effusion. IMPRESSION: Enteric tube tip projects over the distal stomach. Electronically Signed   By: Carey Bullocks M.D.   On: 06/18/2023 14:48   CT CHEST ABDOMEN PELVIS WO CONTRAST Result Date: 06/16/2023 CLINICAL DATA:  Septicemia.  Pneumonia. EXAM: CT CHEST, ABDOMEN AND PELVIS WITHOUT CONTRAST TECHNIQUE: Multidetector CT imaging of the chest, abdomen and pelvis was performed following the standard protocol without IV contrast. RADIATION DOSE REDUCTION: This exam was performed according to the departmental dose-optimization program which includes automated exposure control, adjustment of the mA and/or kV according to patient size and/or use of iterative reconstruction technique. COMPARISON:  CT angio chest from 04/04/2016. FINDINGS: CT CHEST FINDINGS Cardiovascular: Heart size is normal. Aortic atherosclerosis and multi vessel coronary artery calcifications. No pericardial effusion. Mediastinum/Nodes: Thyroid gland, trachea and esophagus appear normal. ET tube is in place with tip above the carina. There is a enteric tube with tip in the stomach.No enlarged mediastinal lymph nodes. Hilar lymph nodes are suboptimally evaluated due to lack of IV contrast. Lungs/Pleura: No pleural effusion or interstitial edema. Bilateral lower lobe airspace consolidation is identified, right greater than left. Imaging findings compatible with multifocal pneumonia. Chronic subsegmental atelectasis this is within the right middle lobe and right lower lobe with asymmetric volume loss and elevation of the right hemidiaphragm. Musculoskeletal: No chest wall mass or suspicious bone lesions identified. CT ABDOMEN PELVIS FINDINGS Hepatobiliary: No focal liver abnormality. Gallbladder appears normal. No signs of bile duct dilatation. Pancreas: Unremarkable. No pancreatic ductal dilatation or surrounding inflammatory changes. Spleen: Normal in  size without focal abnormality. Adrenals/Urinary Tract: Normal adrenal glands. Multiple stones identified within the upper pole of the left kidney which measure up to 7 mm. Bilateral exophytic kidney lesions of varying complexity are identified. These are suboptimally assessed on the current exam reflecting lack of IV contrast material. No signs of hydronephrosis. Urinary bladder is decompressed around a Foley catheter. Stomach/Bowel: Enteric tube tip is in the proximal stomach. There is retained enteric contrast material within the gastric fundus. The appendix is visualized and appears normal. There are several dilated loops of small bowel within the right hemiabdomen. The proximal small bowel loops are normal in caliber. Within the right upper quadrant of the abdomen there are multiple dilated loops of small bowel measuring up to 3.5 cm containing air-fluid levels. The bowel loops proximal and distal are normal in caliber. Within the limitations of unenhanced technique there is no significant bowel wall thickening. Distal colonic diverticulosis identified without signs of acute diverticulitis. Retained enteric contrast material identified within the colon up to the rectum Vascular/Lymphatic: Aortic atherosclerosis without aneurysm. No signs of abdominopelvic adenopathy. Reproductive: Prostate gland is either  atrophic or surgically absent. Penile prosthesis identified. Other: No free fluid or fluid collections. Musculoskeletal: No acute or significant osseous findings. IMPRESSION: 1. Bilateral lower lobe airspace consolidation, right greater than left. Imaging findings compatible with multifocal pneumonia. 2. Chronic subsegmental atelectasis within the right middle lobe and right lower lobe with asymmetric volume loss and elevation of the right hemidiaphragm. 3. Assessment of bowel pathology is limited due to lack of IV and enteric contrast material. There are multiple abnormally dilated loops of small bowel within  the right hemiabdomen. Findings are compatible with a small-bowel obstruction. As there is normal caliber proximal small bowel and normal caliber distal small bowel findings are concerning for either close loop obstruction or internal hernia. 4. Distal colonic diverticulosis without signs of acute diverticulitis. 5. Nonobstructing left renal calculi. 6. Bilateral exophytic kidney lesions of varying complexity are identified. These are suboptimally assessed on the current exam reflecting lack of IV contrast material. Further evaluation with nonemergent renal ultrasound is advised. 7.  Aortic Atherosclerosis (ICD10-I70.0). Electronically Signed   By: Signa Kell M.D.   On: 06/16/2023 15:51   ECHOCARDIOGRAM LIMITED Result Date: 06/15/2023    ECHOCARDIOGRAM LIMITED REPORT   Patient Name:   Nathan Proctor. Date of Exam: 06/15/2023 Medical Rec #:  [id number]              Height:       70.0 in Accession #:    [id number]             Weight:       263.9 lb Date of Birth:  1940-12-11              BSA:          2.348 m Patient Age:    83 years               BP:           121/53 mmHg Patient Gender: M                      HR:           90 bpm. Exam Location:  Inpatient Procedure: Limited Echo, Color Doppler and Intracardiac Opacification Agent            (Both Spectral and Color Flow Doppler were utilized during            procedure). Indications:    Stroke i63.9, Shock  History:        Patient has prior history of Echocardiogram examinations, most                 recent 06/05/2023. CHF; Risk Factors:Hypertension and                 Dyslipidemia.  Sonographer:    Irving Burton Senior RDCS Referring Phys: [phone] [facility] JANE ELLISON  Sonographer Comments: Technically difficult due to body habitus, scanned supine on artificial repirator IMPRESSIONS  1. Very difficult to determine LVEF to limited visualization and frequent ectopy. . Left ventricular ejection fraction, by estimation, is 40 to 45%. The left ventricle has mildly  decreased function. Left ventricular endocardial border not optimally defined to evaluate regional wall motion.  2. Right ventricular systolic function was not well visualized. The right ventricular size is not well visualized.  3. The inferior vena cava is normal in size with <50% respiratory variability, suggesting right atrial pressure of 8 mmHg.  4. Limited echo FINDINGS  Left Ventricle: Very difficult  to determine LVEF to limited visualization and frequent ectopy. Left ventricular ejection fraction, by estimation, is 40 to 45%. The left ventricle has mildly decreased function. Left ventricular endocardial border not optimally defined to evaluate regional wall motion. Definity contrast agent was given IV to delineate the left ventricular endocardial borders. Right Ventricle: The right ventricular size is not well visualized. Right vetricular wall thickness was not well visualized. Right ventricular systolic function was not well visualized. Venous: The inferior vena cava is normal in size with less than 50% respiratory variability, suggesting right atrial pressure of 8 mmHg. Additional Comments: Color Doppler performed.  Dina Rich MD Electronically signed by Dina Rich MD Signature Date/Time: 06/15/2023/4:31:38 PM    Final    DG Abd 1 View Result Date: 06/15/2023 CLINICAL DATA:  Orogastric tube placement. EXAM: ABDOMEN - 1 VIEW COMPARISON:  Radiograph 06/08/2023 FINDINGS: Tip of the enteric tube below the diaphragm in the stomach, the side port is just beyond the gastroesophageal junction. There is gaseous gastric distension. Dilated bowel in the right abdomen up 4.7 cm, potentially small bowel, but incompletely included in the field of view. IMPRESSION: 1. Tip of the enteric tube below the diaphragm in the stomach, side-port just beyond the gastroesophageal junction. 2. Gaseous gastric distension. Additional dilated bowel in the right abdomen likely small bowel, although incompletely included in  the field of view. Electronically Signed   By: Narda Rutherford M.D.   On: 06/15/2023 15:06   DG CHEST PORT 1 VIEW Result Date: 06/15/2023 CLINICAL DATA:  10031 Cough 10031 EXAM: PORTABLE CHEST 1 VIEW COMPARISON:  June 08, 2023 FINDINGS: The cardiomediastinal silhouette is unchanged in contour.Unchanged elevation of the RIGHT hemidiaphragm. No pleural effusion. No pneumothorax. Bibasilar platelike opacities. Atherosclerotic calcifications. Similar background of interstitial prominence. IMPRESSION: Bibasilar platelike opacities, favored to reflect atelectasis. Electronically Signed   By: Meda Klinefelter M.D.   On: 06/15/2023 12:07   DG CHEST PORT 1 VIEW Result Date: 06/15/2023 CLINICAL DATA:  Respiratory failure, intubation and central line placement. EXAM: PORTABLE CHEST 1 VIEW COMPARISON:  Film earlier today at 0734 hours FINDINGS: Interval intubation with the endotracheal tube tip approximately 3.5 cm above the carina. Left jugular central line placement with the line tip in the upper SVC. No pneumothorax. Volume loss of the right lung remains with elevation of the right hemidiaphragm. Improved expansion of the left lung. Potential underlying mild pulmonary interstitial edema. No significant pleural effusions. IMPRESSION: 1. Interval intubation and left jugular central line placement. No pneumothorax. 2. Improved expansion of the left lung. Persistent volume loss of the right lung with elevation of the right hemidiaphragm. Potential underlying mild pulmonary interstitial edema. Electronically Signed   By: Irish Lack M.D.   On: 06/15/2023 10:42   DG Swallowing Func-Speech Pathology Result Date: 06/12/2023 Table formatting from the original result was not included. Modified Barium Swallow Study Patient Details Name: Nathan Proctor. MRN: [id number] Date of Birth: December 17, 1940 Today's Date: 06/12/2023 HPI/PMH: HPI: Pt is a 83 y.o. male who was admitted as a code stroke on 02/05 due to acute onset  of right-sided weakness, right facial droop and aphasia. MRI from 02/06 displayed an evolving acute left MCA infarct, most pronounced at the left insula and left frontal operculum. Pt has intentionally lost 300 pounds in the recent past, with pre-loss weight at ~600Ib. PMHx includes hypertension, diabetes, CKD, prior CVA. Clinical Impression: Clinical Impression: Pt presents with a mild-moderate oropharyngeal dysphagia (DIGEST Score: 1) primarily characterized by silent penetration of thin liquid  at level of VFs, flash penetration of nectar-thick above VFs, and diffuse pharyngeal residue. Pt's dysphagia severity level dropped from DIGEST score of 3 to 1, compared to previous MBS eval on 02/07.     Pharyngeal residue was present throughout all consistencies. Pt's posture for swallowing was consistently neutral. Pt follows commands for compensatory strategies more readily now with max multimodal cueing. Effective compensatory strategies include multiple swallows, throat clearing, and a straw when used with a verbally cued slow rate. Multiple consecutive sips of thin-liquid presented the most risk for aspiration due to silent penetration of Vfs. Unlike previous MBS study, pt tolerated solid consistency, displaying prolonged mastication but majority clearance of bolus after 2-3 swallows.     Recommendations include upgrading pt's diet to dysphagia 2 (finely chopped) and thin liquids to optimize safety and efficiency for swallowing. SLP f/u to monitor tolerance of thin-liquids with compensatory strategies is highly recommended. Pt needs to remain upright for 30 minutes after PO intake due to remaining pharyngeal residue to optimize airway safety. Factors that may increase risk of adverse event in presence of aspiration [name] & Clearance [name] 2021): Factors that may increase risk of adverse event in presence of aspiration [name] & Clearance [name] 2021): Dependence for feeding and/or oral hygiene Recommendations/Plan: Swallowing  Evaluation Recommendations Swallowing Evaluation Recommendations Recommendations: PO diet PO Diet Recommendation: Dysphagia 2 (Finely chopped); Thin liquids (Level 0) Liquid Administration via: Straw Medication Administration: Crushed with puree Supervision: Full supervision/cueing for swallowing strategies; Full assist for feeding Swallowing strategies  : Slow rate; Small bites/sips; Multiple dry swallows after each bite/sip; Clear throat intermittently Postural changes: Position pt fully upright for meals; Stay upright 30-60 min after meals Oral care recommendations: Oral care BID (2x/day); Staff/trained caregiver to provide oral care Treatment Plan Treatment Plan Follow-up recommendations: Follow physicians's recommendations for discharge plan and follow up therapies Functional status assessment: Patient has had a recent decline in their functional status and demonstrates the ability to make significant improvements in function in a reasonable and predictable amount of time. Recommendations Recommendations for follow up therapy are one component of a multi-disciplinary discharge planning process, led by the attending physician.  Recommendations may be updated based on patient status, additional functional criteria and insurance authorization. Assessment: Orofacial Exam: Orofacial Exam Oral Cavity: Oral Hygiene: WFL Oral Cavity - Dentition: Adequate natural dentition Orofacial Anatomy: WFL Anatomy: Anatomy: WFL Boluses Administered: Boluses Administered Boluses Administered: Thin liquids (Level 0); Mildly thick liquids (Level 2, nectar thick); Moderately thick liquids (Level 3, honey thick); Puree; Solid  Oral Impairment Domain: Oral Impairment Domain Lip Closure: Interlabial escape, no progression to anterior lip Tongue control during bolus hold: Cohesive bolus between tongue to palatal seal Bolus preparation/mastication: Slow prolonged chewing/mashing with complete recollection Bolus transport/lingual motion:  Brisk tongue motion Oral residue: Residue collection on oral structures Location of oral residue : Tongue Initiation of pharyngeal swallow : Pyriform sinuses  Pharyngeal Impairment Domain: Pharyngeal Impairment Domain Soft palate elevation: No bolus between soft palate (SP)/pharyngeal wall (PW) Laryngeal elevation: Partial superior movement of thyroid cartilage/partial approximation of arytenoids to epiglottic petiole Anterior hyoid excursion: Complete anterior movement Epiglottic movement: Complete inversion Laryngeal vestibule closure: Incomplete, narrow column air/contrast in laryngeal vestibule Pharyngeal stripping wave : Present - complete Pharyngeal contraction (A/P view only): N/A Pharyngoesophageal segment opening: Complete distension and complete duration, no obstruction of flow Tongue base retraction: Trace column of contrast or air between tongue base and PPW Pharyngeal residue: Collection of residue within or on pharyngeal structures Location of pharyngeal residue: Diffuse (>3  areas)  Esophageal Impairment Domain: Esophageal Impairment Domain Esophageal clearance upright position: -- (Not tested) Pill: No data recorded Penetration/Aspiration Scale Score: Penetration/Aspiration Scale Score 1.  Material does not enter airway: Solid; Puree; Moderately thick liquids (Level 3, honey thick) 2.  Material enters airway, remains ABOVE vocal cords then ejected out: Mildly thick liquids (Level 2, nectar thick) 5.  Material enters airway, CONTACTS cords and not ejected out: Thin liquids (Level 0) Compensatory Strategies: Compensatory Strategies Compensatory strategies: Yes Straw: Effective Effective Straw: Mildly thick liquid (Level 2, nectar thick) (Effective with slow rate applied.) Multiple swallows: Effective Effective Multiple Swallows: Thin liquid (Level 0); Mildly thick liquid (Level 2, nectar thick); Moderately thick liquid (Level 3, honey thick); Puree; Solid   General Information: Caregiver present: No   Diet Prior to this Study: Dysphagia 1 (pureed); Moderately thick liquids (Level 3, honey thick)   Temperature : Normal   Respiratory Status: WFL   Supplemental O2: None (Room air)   History of Recent Intubation: Yes  Behavior/Cognition: Alert; Cooperative; Pleasant mood; Requires cueing Self-Feeding Abilities: Needs assist with self-feeding Baseline vocal quality/speech: Normal Volitional Cough: Unable to elicit Volitional Swallow: Able to elicit Exam Limitations: No limitations Goal Planning: Prognosis for improved oropharyngeal function: Good Barriers to Reach Goals: Language deficits No data recorded No data recorded No data recorded Pain: Pain Assessment Pain Assessment: No/denies pain Pain Score: 0 End of Session: Start Time:SLP Start Time (ACUTE ONLY): 1310 Stop Time: SLP Stop Time (ACUTE ONLY): 1335 Time Calculation:SLP Time Calculation (min) (ACUTE ONLY): 25 min Charges: SLP Evaluations $ SLP Speech Visit: 1 Visit SLP Evaluations $MBS Swallow: 1 Procedure $Swallowing Treatment: 1 Procedure SLP visit diagnosis: SLP Visit Diagnosis: Dysphagia, oropharyngeal phase (R13.12) Past Medical History: Past Medical History: Diagnosis Date  Anemia of chronic disease   Ankle fracture 02/15/2016  Cancer (HCC)   Prostate  Chronic constipation   Chronic kidney disease   stage III  Diabetes mellitus without complication (HCC)   type II   Diabetic peripheral neuropathy (HCC)   Failure to thrive (0-17)   Fracture of left lower leg   Gout   Hyperlipidemia   Hypertension   Morbid obesity (HCC)   Unstable gait  Past Surgical History: Past Surgical History: Procedure Laterality Date  IR CT HEAD LTD  06/05/2023  IR INTRAVSC STENT CERV CAROTID W/O EMB-PROT MOD SED INC ANGIO  06/05/2023  IR PERCUTANEOUS ART THROMBECTOMY/INFUSION INTRACRANIAL INC DIAG ANGIO  06/05/2023  IR US GUIDE VASC ACCESS RIGHT  06/05/2023  ORIF ANKLE FRACTURE Left 02/29/2016  Procedure: OPEN REDUCTION INTERNAL FIXATION (ORIF) ANKLE FRACTURE;  Surgeon: Yolonda Kida, MD;  Location: [facility];  Service: Orthopedics;  Laterality: Left;  PROSTATE SURGERY    RADIOLOGY WITH ANESTHESIA N/A 06/05/2023  Procedure: IR WITH ANESTHESIA;  Surgeon: Radiologist, Medication, MD;  Location: [facility];  Service: Radiology;  Laterality: N/A; Claudine Mouton 06/12/2023, 3:16 PM  DG Abd 1 View Result Date: 06/09/2023 CLINICAL DATA:  [id number]. Encounter for feeding tube placement. EXAM: ABDOMEN - 1 VIEW COMPARISON:  Abdomen film 06/05/2023. FINDINGS: Dobbhoff feeding tube is well placed with the radiopaque tip in the distal stomach. The visualized bowel pattern is nonobstructive. There is barium newly seen in the flexures and transverse colon. There is no supine evidence of free air. There is atelectasis in the lung bases. Cardiomegaly. IMPRESSION: 1. Dobbhoff feeding tube well placed with the radiopaque tip in the distal stomach. 2. Nonobstructive bowel gas pattern. 3. Cardiomegaly. Electronically Signed   By: Mellody Dance  Chesser M.D.   On: 06/09/2023 03:41   DG CHEST PORT 1 VIEW Result Date: 06/08/2023 CLINICAL DATA:  CHF EXAM: PORTABLE CHEST 1 VIEW COMPARISON:  01/06/2023 FINDINGS: Feeding tube in place. There is cardiomegaly with vascular congestion. Chronic elevation of the right hemidiaphragm with right base atelectasis. Interstitial prominence throughout the lungs, right greater than left could reflect interstitial edema. Possible small effusions. No acute bony abnormality. IMPRESSION: Cardiomegaly with vascular congestion and probable mild interstitial edema. Question small bilateral effusions. Chronic elevation of the right hemidiaphragm with right base atelectasis. Electronically Signed   By: Charlett Nose M.D.   On: 06/08/2023 18:20   DG Swallowing Func-Speech Pathology Result Date: 06/07/2023 Table formatting from the original result was not included. Modified Barium Swallow Study Study completed and documented by [name], SLP Student Supervised and reviewed by Harlon Ditty, MA CCC-SLP [facility] Secure Chat Preferred Office [phone] Patient Details Name: Nathan Proctor. MRN: [id number] Date of Birth: 01-23-1941 Today's Date: 06/07/2023 HPI/PMH: HPI: Pt is a 83 y.o. male who was admitted as a code stroke on 02/05 due to acute onset of right-sided weakness, right facial droop and aphasia. MRI from 02/06 displayed an evolving acute left MCA infarct, most pronounced at the left insula and left frontal operculum. Pt has intentionally lost 300 pounds in the recent past, with pre-loss weight at ~600Ib. PMHx includes hypertension, diabetes, CKD, prior CVA. Clinical Impression: Clinical Impression: Pt presented with a moderate-severe oropharyngeal dysphagia (DIGEST Score: 3) primarily characterized by silent aspiration of thin liquid, silent penetration of nectar-thick liquid, diffuse pharyngeal residue collection during thin/nectar-thick consistencies, and posterior-escape of bolus across multiple consistencies. Pt positioning was a potential limitation for examination due to consistent posterior head position. More anterior head positioning during nectar-thick liquid displayed less pharyngeal residue, increased hyo-laryngeal excursion, and overall better airway safety. This positioning was not present throughout due to pt's receptive language deficits to understand cues. Alternating between honey-thick and puree consistencies provided more oropharyngeal bolus clearance and no signs of aspiration. Recommend a dysphagia 1 (puree) and honey-thick liquid diet to optimize pt's airway safety and potential for meeting nutritional needs. Slow rate, small bites/sips, natural head posturing, and multiple swallows are compensatory strategies that displayed best efficiency with pt. F/u with SLP for family education, compensatory strategy training, and upgraded diet trials is recommended. Factors that may increase risk of adverse event in presence of aspiration [name] &  Clearance [name] 2021): Factors that may increase risk of adverse event in presence of aspiration [name] & Clearance [name] 2021): Weak cough Recommendations/Plan: Swallowing Evaluation Recommendations Swallowing Evaluation Recommendations Recommendations: PO diet PO Diet Recommendation: Dysphagia 1 (Pureed); Moderately thick liquids (Level 3, honey thick) Liquid Administration via: Spoon Medication Administration: Crushed with puree Supervision: Full supervision/cueing for swallowing strategies; Full assist for feeding Swallowing strategies  : Slow rate; Small bites/sips; Multiple dry swallows after each bite/sip (Pt needs to be in a neutral head position (no posterior head tilt).) Postural changes: Position pt fully upright for meals; Stay upright 30-60 min after meals Oral care recommendations: Oral care BID (2x/day); Staff/trained caregiver to provide oral care Caregiver Recommendations: Have oral suction available Treatment Plan Treatment Plan Treatment recommendations: Therapy as outlined in treatment plan below Follow-up recommendations: Follow physicians's recommendations for discharge plan and follow up therapies Functional status assessment: Patient has had a recent decline in their functional status and demonstrates the ability to make significant improvements in function in a reasonable and predictable amount of time. Treatment frequency: Min 2x/week Treatment  duration: 2 weeks Interventions: Aspiration precaution training; Compensatory techniques; Patient/family education; Diet toleration management by SLP; Trials of upgraded texture/liquids Recommendations Recommendations for follow up therapy are one component of a multi-disciplinary discharge planning process, led by the attending physician.  Recommendations may be updated based on patient status, additional functional criteria and insurance authorization. Assessment: Orofacial Exam: Orofacial Exam Oral Cavity - Dentition: Adequate natural dentition Orofacial  Anatomy: WFL Anatomy: Anatomy: WFL Boluses Administered: Boluses Administered Boluses Administered: Thin liquids (Level 0); Mildly thick liquids (Level 2, nectar thick); Moderately thick liquids (Level 3, honey thick); Puree; Solid  Oral Impairment Domain: Oral Impairment Domain Lip Closure: Escape progressing to mid-chin Tongue control during bolus hold: Posterior escape of greater than half of bolus Bolus preparation/mastication: -- (Pt did not chew solid and SLP had to remove it from mouth.) Bolus transport/lingual motion: Repetitive/disorganized tongue motion Oral residue: Residue collection on oral structures Location of oral residue : Tongue; Palate Initiation of pharyngeal swallow : Pyriform sinuses  Pharyngeal Impairment Domain: Pharyngeal Impairment Domain Soft palate elevation: No bolus between soft palate (SP)/pharyngeal wall (PW) Laryngeal elevation: Partial superior movement of thyroid cartilage/partial approximation of arytenoids to epiglottic petiole Anterior hyoid excursion: Partial anterior movement Epiglottic movement: Partial inversion Laryngeal vestibule closure: Incomplete, narrow column air/contrast in laryngeal vestibule Pharyngeal stripping wave : Present - complete Pharyngeal contraction (A/P view only): N/A Pharyngoesophageal segment opening: Complete distension and complete duration, no obstruction of flow Tongue base retraction: Narrow column of contrast or air between tongue base and PPW Pharyngeal residue: Collection of residue within or on pharyngeal structures Location of pharyngeal residue: Diffuse (>3 areas)  Esophageal Impairment Domain: Esophageal Impairment Domain Esophageal clearance upright position: -- (Not tested.) Pill: No data recorded Penetration/Aspiration Scale Score: Penetration/Aspiration Scale Score 1.  Material does not enter airway: Puree; Moderately thick liquids (Level 3, honey thick) 3.  Material enters airway, remains ABOVE vocal cords and not ejected out:  Mildly thick liquids (Level 2, nectar thick) 8.  Material enters airway, passes BELOW cords without attempt by patient to eject out (silent aspiration) : Thin liquids (Level 0) Compensatory Strategies: Compensatory Strategies Compensatory strategies: Yes Straw: Ineffective Multiple swallows: Effective (Required max cueing due to pt's language.) Effective Multiple Swallows: Puree; Moderately thick liquid (Level 3, honey thick); Mildly thick liquid (Level 2, nectar thick)   General Information: Caregiver present: No  Diet Prior to this Study: NPO   Temperature : Normal   Respiratory Status: WFL   Supplemental O2: None (Room air)   No data recorded Behavior/Cognition: Alert; Cooperative; Pleasant mood; Requires cueing Self-Feeding Abilities: Needs assist with self-feeding Baseline vocal quality/speech: Dysphonic Volitional Cough: Unable to elicit Volitional Swallow: Able to elicit Exam Limitations: Poor positioning Goal Planning: Prognosis for improved oropharyngeal function: Good Barriers to Reach Goals: Language deficits No data recorded No data recorded Consulted and agree with results and recommendations: Pt unable/family or caregiver not available Pain: Pain Assessment Pain Assessment: No/denies pain Faces Pain Scale: 0 Facial Expression: 0 Body Movements: 0 Muscle Tension: 0 Compliance with ventilator (intubated pts.): N/A Vocalization (extubated pts.): 0 CPOT Total: 0 End of Session: Start Time:SLP Start Time (ACUTE ONLY): 0454 Stop Time: SLP Stop Time (ACUTE ONLY): 0930 Time Calculation:SLP Time Calculation (min) (ACUTE ONLY): 27 min Charges: SLP Evaluations $ SLP Speech Visit: 1 Visit SLP Evaluations $BSS Swallow: 1 Procedure $MBS Swallow: 1 Procedure $ SLP EVAL LANGUAGE/SOUND PRODUCTION: 1 Procedure $Swallowing Treatment: 1 Procedure SLP visit diagnosis: SLP Visit Diagnosis: Dysphagia, oropharyngeal phase (R13.12) Past Medical History: Past  Medical History: Diagnosis Date  Anemia of chronic disease   Ankle  fracture 02/15/2016  Cancer (HCC)   Prostate  Chronic constipation   Chronic kidney disease   stage III  Diabetes mellitus without complication (HCC)   type II   Diabetic peripheral neuropathy (HCC)   Failure to thrive (0-17)   Fracture of left lower leg   Gout   Hyperlipidemia   Hypertension   Morbid obesity (HCC)   Unstable gait  Past Surgical History: Past Surgical History: Procedure Laterality Date  IR CT HEAD LTD  06/05/2023  IR INTRAVSC STENT CERV CAROTID W/O EMB-PROT MOD SED INC ANGIO  06/05/2023  IR PERCUTANEOUS ART THROMBECTOMY/INFUSION INTRACRANIAL INC DIAG ANGIO  06/05/2023  IR US GUIDE VASC ACCESS RIGHT  06/05/2023  ORIF ANKLE FRACTURE Left 02/29/2016  Procedure: OPEN REDUCTION INTERNAL FIXATION (ORIF) ANKLE FRACTURE;  Surgeon: Yolonda Kida, MD;  Location: [facility];  Service: Orthopedics;  Laterality: Left;  PROSTATE SURGERY    RADIOLOGY WITH ANESTHESIA N/A 06/05/2023  Procedure: IR WITH ANESTHESIA;  Surgeon: Radiologist, Medication, MD;  Location: [facility];  Service: Radiology;  Laterality: N/A; [place], Riley Nearing 06/07/2023, 12:15 PM  CT HEAD WO CONTRAST ( ) Result Date: 06/07/2023 CLINICAL DATA:  83 year old male status post code stroke presentation, left MCA M2 occlusion, with left MCA infarct with confluent petechial hemorrhage. EXAM: CT HEAD WITHOUT CONTRAST TECHNIQUE: Contiguous axial images were obtained from the base of the skull through the vertex without intravenous contrast. RADIATION DOSE REDUCTION: This exam was performed according to the departmental dose-optimization program which includes automated exposure control, adjustment of the mA and/or kV according to patient size and/or use of iterative reconstruction technique. COMPARISON:  Brain MRI yesterday.  Head CT 06/05/2023. FINDINGS: Brain: Confluent mixed density infarct in the left MCA middle division, epicenter at the insula and operculum. Confluent petechial hemorrhage on series 3, image 16 appears stable from the MRI. Superimposed  trace subarachnoid blood is difficult to exclude by CT (series 3, image 13), but was not apparent on MRI. Stable mild mass effect on the left lateral ventricle with only trace rightward midline shift (series 3, image 16). No ventriculomegaly. Elsewhere gray-white differentiation is stable from the presentation CT. Basilar cisterns remain patent. Vascular: Resolved hyperdense left MCA since presentation. Calcified atherosclerosis at the skull base. Skull: Stable, intact. Sinuses/Orbits: Visualized paranasal sinuses and mastoids are stable and well aerated. Other: Left nasoenteric tube now in place. Stable orbit and scalp soft tissues. IMPRESSION: 1. Stable by CT confluent Left MCA middle division infarct with petechial hemorrhage (Heidelberg classification 1b: HI2, confluent petechiae, no mass effect). Questionable trace superimposed SAH, but was not apparent on MRI. 2. Stable minimal intracranial mass effect. 3. No new intracranial abnormality. Electronically Signed   By: Odessa Fleming M.D.   On: 06/07/2023 05:37   MR BRAIN WO CONTRAST Result Date: 06/06/2023 CLINICAL DATA:  Follow-up examination for stroke. EXAM: MRI HEAD WITHOUT CONTRAST TECHNIQUE: Multiplanar, multiecho pulse sequences of the brain and surrounding structures were obtained without intravenous contrast. COMPARISON:  Comparison made to multiple previous exams from 06/05/2023. FINDINGS: Brain: Examination degraded by motion artifact. Cerebral volume within normal limits for age. Patchy T2/FLAIR hyperintensity involving the periventricular deep white matter both cerebral hemispheres, consistent with chronic small vessel ischemic disease, mild in nature. Small remote infarct noted within the right cerebellum. Confluent restricted diffusion involving the left insula and left frontal operculum, consistent with evolving acute left MCA distribution infarct. Area of infarction measures up to approximately 6 cm in  AP diameter. Prominent associated susceptibility  artifact, consistent with petechial hemorrhage (series 7, image 61) ( Heidelberg classification 1b: HI2, confluent petechiae, no mass effect. No frank or organized hematoma evident by MRI. No significant regional mass effect. Few additional small foci of infarction noted within the left parieto-occipital region posteriorly (series 3, images 40, 29). Apparent diffusion signal along the right frontal parafalcine region felt to be consistent with artifact related to dural calcification. Note made of a few additional chronic micro hemorrhages within the left cerebellum and right thalamus. No mass lesion or midline shift. Ventricles normal size without hydrocephalus. No extra-axial fluid collection. Pituitary gland suprasellar region within normal limits. Vascular: Major intracranial vascular flow voids are maintained. Skull and upper cervical spine: Craniocervical junction within normal limits. Bone marrow signal intensity overall within normal limits. No scalp soft tissue abnormality. Sinuses/Orbits: Prior bilateral ocular lens replacement. Paranasal sinuses are clear. No mastoid effusion. Other: None. IMPRESSION: 1. Evolving acute left MCA distribution infarct, most pronounced at the left insula and left frontal operculum. Prominent associated petechial hemorrhage without frank intraparenchymal hematoma (Heidelberg classification 1b: HI2, confluent petechiae, no mass effect). 2. Few additional small foci of acute infarction within the left parieto-occipital region posteriorly. 3. Underlying mild chronic microvascular ischemic disease with small remote right cerebellar infarct. Electronically Signed   By: Rise Mu M.D.   On: 06/06/2023 03:12   ECHOCARDIOGRAM COMPLETE Result Date: 06/05/2023    ECHOCARDIOGRAM REPORT   Patient Name:   Nathan Proctor. Date of Exam: 06/05/2023 Medical Rec #:  [id number]              Height:       70.0 in Accession #:    [id number]             Weight:       292.3 lb Date  of Birth:  1940-11-04              BSA:          2.453 m Patient Age:    82 years               BP:           129/72 mmHg Patient Gender: M                      HR:           89 bpm. Exam Location:  Inpatient Procedure: 2D Echo, Color Doppler, Cardiac Doppler and Intracardiac            Opacification Agent Indications:    Stroke I63.9  History:        Patient has prior history of Echocardiogram examinations, most                 recent 10/06/2019. Risk Factors:Diabetes, Hypertension and                 Dyslipidemia.  Sonographer:    Harriette Bouillon RDCS Referring Phys: Lynnae January IMPRESSIONS  1. Left ventricular ejection fraction, by estimation, is 30%. The left ventricle has moderately decreased function. The left ventricle demonstrates global hypokinesis. There is mild left ventricular hypertrophy.  2. Right ventricular systolic function is normal. The right ventricular size is normal.  3. Trivial mitral valve regurgitation.  4. The aortic valve is tricuspid. Aortic valve regurgitation is not visualized.  5. The inferior vena cava is normal in size with greater than 50% respiratory variability, suggesting right atrial  pressure of 3 mmHg. Comparison(s): The left ventricular function is worsened. FINDINGS  Left Ventricle: Left ventricular ejection fraction, by estimation, is 30%. The left ventricle has moderately decreased function. The left ventricle demonstrates global hypokinesis. Definity contrast agent was given IV to delineate the left ventricular endocardial borders. The left ventricular internal cavity size was normal in size. There is mild left ventricular hypertrophy. Right Ventricle: The right ventricular size is normal. Right vetricular wall thickness was not assessed. Right ventricular systolic function is normal. Left Atrium: Left atrial size was normal in size. Right Atrium: Right atrial size was normal in size. Pericardium: There is no evidence of pericardial effusion. Mitral Valve: There is mild  thickening of the mitral valve leaflet(s). Mild to moderate mitral annular calcification. Trivial mitral valve regurgitation. Tricuspid Valve: The tricuspid valve is normal in structure. Tricuspid valve regurgitation is trivial. Aortic Valve: The aortic valve is tricuspid. Aortic valve regurgitation is not visualized. Pulmonic Valve: The pulmonic valve was normal in structure. Pulmonic valve regurgitation is not visualized. Aorta: The aortic root and ascending aorta are structurally normal, with no evidence of dilitation. Venous: The inferior vena cava is normal in size with greater than 50% respiratory variability, suggesting right atrial pressure of 3 mmHg. IAS/Shunts: The interatrial septum was not assessed.  LEFT VENTRICLE PLAX 2D LVIDd:         5.00 cm   Diastology LVIDs:         4.40 cm   LV e' lateral: 5.33 cm/s LV PW:         1.20 cm LV IVS:        1.10 cm LVOT diam:     2.30 cm LV SV:         55 LV SV Index:   23 LVOT Area:     4.15 cm  RIGHT VENTRICLE RV S prime:     14.40 cm/s LEFT ATRIUM         Index LA diam:    4.60 cm 1.88 cm/m  AORTIC VALVE LVOT Vmax:   65.50 cm/s LVOT Vmean:  45.200 cm/s LVOT VTI:    0.133 m  AORTA Ao Root diam: 3.00 cm Ao Asc diam:  3.50 cm  SHUNTS Systemic VTI:  0.13 m Systemic Diam: 2.30 cm Dietrich Pates MD Electronically signed by Dietrich Pates MD Signature Date/Time: 06/05/2023/9:33:38 PM    Final    DG Abd Portable 1V Result Date: 06/05/2023 CLINICAL DATA:  Feeding tube placement. EXAM: PORTABLE ABDOMEN - 1 VIEW COMPARISON:  None Available. FINDINGS: Distal tip of feeding tube is seen in expected position of distal stomach. IMPRESSION: Distal tip of feeding tube seen in expected position of distal stomach. Electronically Signed   By: Lupita Raider M.D.   On: 06/05/2023 15:49   CT HEAD WO CONTRAST Result Date: 06/05/2023 CLINICAL DATA:  Stroke, follow-up. Status post intracranial mechanical thrombectomy and stenting of the left ICA bifurcation. EXAM: CT HEAD WITHOUT CONTRAST  TECHNIQUE: Contiguous axial images were obtained from the base of the skull through the vertex without intravenous contrast. RADIATION DOSE REDUCTION: This exam was performed according to the departmental dose-optimization program which includes automated exposure control, adjustment of the mA and/or kV according to patient size and/or use of iterative reconstruction technique. COMPARISON:  CT head without contrast and CT angio head and neck 06/05/2023. By plain CT 06/05/2023. FINDINGS: Brain: The study is mildly degraded by patient motion. The left insular and left opercular infarct is somewhat obscured by patient motion. The  cortex is slightly hyperdense, potentially reflecting reperfusion. No hemorrhage is present. Basal ganglia are intact. Subcortical white matter hypoattenuation in the high left frontal lobe is new. Mild right-sided white matter disease is stable. Basal ganglia are intact. A remote lacunar infarct is again noted in the right cerebellum. The brainstem and cerebellum are otherwise within normal limits. Vascular: Atherosclerotic calcifications are present within the cavernous internal carotid arteries and at the normal origin of both vertebral arteries. No hyperdense vessel is present. Skull: Calvarium is intact. No focal lytic or blastic lesions are present. No significant extracranial soft tissue lesion is present. Sinuses/Orbits: The paranasal sinuses and mastoid air cells are clear. Bilateral lens replacements are noted. Globes and orbits are otherwise unremarkable. IMPRESSION: 1. The left insular and left opercular infarct is somewhat obscured by patient motion. 2. The cortex is slightly hyperdense, potentially reflecting reperfusion. 3. No hemorrhage. 4. Subcortical white matter hypoattenuation in the high left frontal lobe is new. This may reflect ischemic changes or edema related to the reperfusion. 5. Remote lacunar infarct of the right cerebellum. 6. Stable mild right-sided white matter  disease. This likely reflects the sequela of chronic microvascular ischemia. Electronically Signed   By: Marin Roberts M.D.   On: 06/05/2023 14:38   IR PERCUTANEOUS ART THROMBECTOMY/INFUSION INTRACRANIAL INC DIAG ANGIO Result Date: 06/05/2023 INDICATION: 82 year old male presenting with right-sided weakness and aphasia; NIHSS 28. His last known well was 10 p.m. on 06/04/2023. His past medical history significant for prior stroke, hypertension, diabetes and chronic kidney disease; baseline modified Rankin scale 0. Head CT showed hypodensity within the left insula, basal ganglia and left frontal operculum (ASPECTS 7). No IV thrombolytic given as patient was outside the window. CT angiogram of the head and neck showed an occlusion of a left M2/MCA anterior division branch. CT perfusion showed a 41 mL core infarct with a 45 mL ischemic penumbra. She was transferred to our service for mechanical thrombectomy. EXAM: ULTRASOUND-GUIDED VASCULAR ACCESS DIAGNOSTIC CEREBRAL ANGIOGRAM MECHANICAL THROMBECTOMY FLAT PANEL HEAD CT LEFT CAROTID STENTING AND ANGIOPLASTY WITHOUT CEREBRAL PROTECTION DEVICE COMPARISON:  CT/CT angiogram of the head and neck June 05, 2023. MEDICATIONS: No antibiotics administered. ANESTHESIA/SEDATION: The procedure was performed under general anesthesia. CONTRAST:  80 mL of Omnipaque 300 milligram/mL FLUOROSCOPY: Radiation Exposure Index (as provided by the fluoroscopic device): 1315 mGy Kerma COMPLICATIONS: None immediate. TECHNIQUE: Informed written consent was obtained from the patient's wife after a thorough discussion of the procedural risks, benefits and alternatives. All questions were addressed. Maximal Sterile Barrier Technique was utilized including caps, mask, sterile gowns, sterile gloves, sterile drape, hand hygiene and skin antiseptic. A timeout was performed prior to the initiation of the procedure. The right groin was prepped and draped in the usual sterile fashion. Using a  micropuncture kit and the modified Seldinger technique, access was gained to the right common femoral artery and an 8 French sheath was placed. Real-time ultrasound guidance was utilized for vascular access including the acquisition of a permanent ultrasound image documenting patency of the accessed vessel. Under fluoroscopy, an 8 [place] Walrus balloon guide catheter was navigated over a 6 [place] VTK catheter and a 0.035" Terumo Glidewire into the aortic arch. The catheter was placed into the left common carotid artery and then advanced into the left internal carotid artery. The diagnostic catheter was removed. Frontal and lateral angiograms of the head were obtained. FINDINGS: 1. Ultrasound showed heavily calcified right common femoral artery is maintained patency and caliber. 2. Proximal occlusion of a left M2/MCA anterior  division branch. 3. Atherosclerotic changes of the intracranial left ICA with mild stenosis at the distal cavernous segment. 4. 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 PROCEDURE: Using biplane roadmap guidance, a Red 62 aspiration catheter was navigated over Colossus 35 microguidewire into the cavernous segment of the left ICA. The aspiration catheter was then advanced to the level of occlusion and connected to an aspiration pump. Continuous aspiration was performed for 2 minutes. The guide catheter was connected to a VacLok syringe and the guiding catheter balloon was inflated. The aspiration catheter was subsequently removed under constant aspiration. The guide catheter was aspirated for debris. Left internal carotid artery angiograms with frontal and lateral views of the head showed complete recanalization of the left MCA vascular tree. The guide catheter was retracted into the neck. Frontal and lateral angiograms of the neck were obtained. Improvement of the degree of stenosis in the  carotid bulb compared to prior CT angiogram. There is prominent luminal irregularity at the carotid bulb residual moderate stenosis. Increased tortuosity of the proximal/mid cervical left ICA with kinking. Left internal carotid artery angiograms with left anterior oblique views of the neck showed evidence of filling defects at the level of the carotid bulb. Flat panel CT of the head was obtained and post processed in a separate workstation with concurrent attending physician supervision. Selected images were sent to PACS. No evidence of hemorrhagic complication. There is mild contrast staining of the left insular and frontal cortex. Repeat left internal carotid artery angiograms with frontal and lateral views of the head showed Amy seen left M3/MCA branch to the left parietal region. Left common carotid artery angiograms with frontal and lateral views of the neck showed vertebra Gretchen of stenosis at the left ICA bulb with more prominent filling defect. At this point, patient was loaded on cangrelor followed by continuous drip. Using biplane roadmap guidance, a 4-7 mm Emboshield NAV6 cerebral protection device was advanced into the cervical left ICA. However, multiple attempts to advance a cerebral protection device through the left ICA kinking proved unsuccessful. The cerebral protection device was subsequently removed. Using biplane roadmap guidance, a 10-8 x 40 mm XACT carotid stent was navigated and deployed from the distal left common carotid artery to the proximal left internal carotid artery, proximal to the vessel kinking. Suboptimal stent expansion was noted. Then, a 6 x 30 mm Viatrac balloon was navigated into the recently deployed stent. Angioplasty was performed under fluoroscopy. Left internal carotid artery angiograms with frontal and lateral views of the neck showed adequate stent positioning and expansion with brisk anterograde flow. Left internal carotid artery angiograms with frontal and lateral  views of the head showed improvement of anterograde flow in the left MCA vascular tree with brisk anterograde flow. Delayed left common carotid artery angiograms with frontal and lateral views of the neck showed no evidence of clot formation within the stent. The catheter was subsequently Riddle. Right common femoral artery angiogram was obtained in right anterior oblique view. The puncture is at the level of the common femoral artery. The artery has normal caliber, adequate for closure device. The sheath was exchanged over the wire for an 8 [place] Angio-Seal which was utilized for access closure. Immediate hemostasis was achieved. IMPRESSION: 1. Successful mechanical thrombectomy for treatment of a proximal left M2/MCA anterior division branch occlusion achieving complete recanalization (TICI 3). 2. Atherosclerotic disease of the left carotid bifurcation with stenosis and  clot formation suggesting acute plaque rupture treated with stenting and angioplasty with resolution of stenosis. PLAN: Continue cangrelor infusion until patient is transitioned to oral dual antiplatelet therapy. Electronically Signed   By: Baldemar Lenis M.D.   On: 06/05/2023 13:16   IR US Guide Vasc Access Right Result Date: 06/05/2023 INDICATION: 83 year old male presenting with right-sided weakness and aphasia; NIHSS 28. His last known well was 10 p.m. on 06/04/2023. His past medical history significant for prior stroke, hypertension, diabetes and chronic kidney disease; baseline modified Rankin scale 0. Head CT showed hypodensity within the left insula, basal ganglia and left frontal operculum (ASPECTS 7). No IV thrombolytic given as patient was outside the window. CT angiogram of the head and neck showed an occlusion of a left M2/MCA anterior division branch. CT perfusion showed a 41 mL core infarct with a 45 mL ischemic penumbra. She was transferred to our service for mechanical thrombectomy. EXAM: ULTRASOUND-GUIDED  VASCULAR ACCESS DIAGNOSTIC CEREBRAL ANGIOGRAM MECHANICAL THROMBECTOMY FLAT PANEL HEAD CT LEFT CAROTID STENTING AND ANGIOPLASTY WITHOUT CEREBRAL PROTECTION DEVICE COMPARISON:  CT/CT angiogram of the head and neck June 05, 2023. MEDICATIONS: No antibiotics administered. ANESTHESIA/SEDATION: The procedure was performed under general anesthesia. CONTRAST:  80 mL of Omnipaque 300 milligram/mL FLUOROSCOPY: Radiation Exposure Index (as provided by the fluoroscopic device): 1315 mGy Kerma COMPLICATIONS: None immediate. TECHNIQUE: Informed written consent was obtained from the patient's wife after a thorough discussion of the procedural risks, benefits and alternatives. All questions were addressed. Maximal Sterile Barrier Technique was utilized including caps, mask, sterile gowns, sterile gloves, sterile drape, hand hygiene and skin antiseptic. A timeout was performed prior to the initiation of the procedure. The right groin was prepped and draped in the usual sterile fashion. Using a micropuncture kit and the modified Seldinger technique, access was gained to the right common femoral artery and an 8 French sheath was placed. Real-time ultrasound guidance was utilized for vascular access including the acquisition of a permanent ultrasound image documenting patency of the accessed vessel. Under fluoroscopy, an 8 [place] Walrus balloon guide catheter was navigated over a 6 [place] VTK catheter and a 0.035" Terumo Glidewire into the aortic arch. The catheter was placed into the left common carotid artery and then advanced into the left internal carotid artery. The diagnostic catheter was removed. Frontal and lateral angiograms of the head were obtained. FINDINGS: 1. Ultrasound showed heavily calcified right common femoral artery is maintained patency and caliber. 2. Proximal occlusion of a left M2/MCA anterior division branch. 3. Atherosclerotic changes of the intracranial left ICA with mild stenosis at the distal cavernous  segment. 4. 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 PROCEDURE: Using biplane roadmap guidance, a Red 62 aspiration catheter was navigated over Colossus 35 microguidewire into the cavernous segment of the left ICA. The aspiration catheter was then advanced to the level of occlusion and connected to an aspiration pump. Continuous aspiration was performed for 2 minutes. The guide catheter was connected to a VacLok syringe and the guiding catheter balloon was inflated. The aspiration catheter was subsequently removed under constant aspiration. The guide catheter was aspirated for debris. Left internal carotid artery angiograms with frontal and lateral views of the head showed complete recanalization of the left MCA vascular tree. The guide catheter was retracted into the neck. Frontal and lateral angiograms of the neck were obtained. Improvement of the degree of stenosis in the carotid bulb  compared to prior CT angiogram. There is prominent luminal irregularity at the carotid bulb residual moderate stenosis. Increased tortuosity of the proximal/mid cervical left ICA with kinking. Left internal carotid artery angiograms with left anterior oblique views of the neck showed evidence of filling defects at the level of the carotid bulb. Flat panel CT of the head was obtained and post processed in a separate workstation with concurrent attending physician supervision. Selected images were sent to PACS. No evidence of hemorrhagic complication. There is mild contrast staining of the left insular and frontal cortex. Repeat left internal carotid artery angiograms with frontal and lateral views of the head showed Amy seen left M3/MCA branch to the left parietal region. Left common carotid artery angiograms with frontal and lateral views of the neck showed vertebra Gretchen of stenosis at the left ICA bulb with more prominent  filling defect. At this point, patient was loaded on cangrelor followed by continuous drip. Using biplane roadmap guidance, a 4-7 mm Emboshield NAV6 cerebral protection device was advanced into the cervical left ICA. However, multiple attempts to advance a cerebral protection device through the left ICA kinking proved unsuccessful. The cerebral protection device was subsequently removed. Using biplane roadmap guidance, a 10-8 x 40 mm XACT carotid stent was navigated and deployed from the distal left common carotid artery to the proximal left internal carotid artery, proximal to the vessel kinking. Suboptimal stent expansion was noted. Then, a 6 x 30 mm Viatrac balloon was navigated into the recently deployed stent. Angioplasty was performed under fluoroscopy. Left internal carotid artery angiograms with frontal and lateral views of the neck showed adequate stent positioning and expansion with brisk anterograde flow. Left internal carotid artery angiograms with frontal and lateral views of the head showed improvement of anterograde flow in the left MCA vascular tree with brisk anterograde flow. Delayed left common carotid artery angiograms with frontal and lateral views of the neck showed no evidence of clot formation within the stent. The catheter was subsequently Riddle. Right common femoral artery angiogram was obtained in right anterior oblique view. The puncture is at the level of the common femoral artery. The artery has normal caliber, adequate for closure device. The sheath was exchanged over the wire for an 8 [place] Angio-Seal which was utilized for access closure. Immediate hemostasis was achieved. IMPRESSION: 1. Successful mechanical thrombectomy for treatment of a proximal left M2/MCA anterior division branch occlusion achieving complete recanalization (TICI 3). 2. Atherosclerotic disease of the left carotid bifurcation with stenosis and clot formation suggesting acute plaque rupture treated with stenting  and angioplasty with resolution of stenosis. PLAN: Continue cangrelor infusion until patient is transitioned to oral dual antiplatelet therapy. Electronically Signed   By: Baldemar Lenis M.D.   On: 06/05/2023 13:16   IR INTRAVSC STENT CERV CAROTID W/O EMB-PROT MOD SED Result Date: 06/05/2023 INDICATION: 83 year old male presenting with right-sided weakness and aphasia; NIHSS 28. His last known well was 10 p.m. on 06/04/2023. His past medical history significant for prior stroke, hypertension, diabetes and chronic kidney disease; baseline modified Rankin scale 0. Head CT showed hypodensity within the left insula, basal ganglia and left frontal operculum (ASPECTS 7). No IV thrombolytic given as patient was outside the window. CT angiogram of the head and neck showed an occlusion of a left M2/MCA anterior division branch. CT perfusion showed a 41 mL core infarct with a 45 mL ischemic penumbra. She was transferred to our service for mechanical thrombectomy. EXAM: ULTRASOUND-GUIDED VASCULAR ACCESS DIAGNOSTIC CEREBRAL ANGIOGRAM MECHANICAL  THROMBECTOMY FLAT PANEL HEAD CT LEFT CAROTID STENTING AND ANGIOPLASTY WITHOUT CEREBRAL PROTECTION DEVICE COMPARISON:  CT/CT angiogram of the head and neck June 05, 2023. MEDICATIONS: No antibiotics administered. ANESTHESIA/SEDATION: The procedure was performed under general anesthesia. CONTRAST:  80 mL of Omnipaque 300 milligram/mL FLUOROSCOPY: Radiation Exposure Index (as provided by the fluoroscopic device): 1315 mGy Kerma COMPLICATIONS: None immediate. TECHNIQUE: Informed written consent was obtained from the patient's wife after a thorough discussion of the procedural risks, benefits and alternatives. All questions were addressed. Maximal Sterile Barrier Technique was utilized including caps, mask, sterile gowns, sterile gloves, sterile drape, hand hygiene and skin antiseptic. A timeout was performed prior to the initiation of the procedure. The right groin was  prepped and draped in the usual sterile fashion. Using a micropuncture kit and the modified Seldinger technique, access was gained to the right common femoral artery and an 8 French sheath was placed. Real-time ultrasound guidance was utilized for vascular access including the acquisition of a permanent ultrasound image documenting patency of the accessed vessel. Under fluoroscopy, an 8 [place] Walrus balloon guide catheter was navigated over a 6 [place] VTK catheter and a 0.035" Terumo Glidewire into the aortic arch. The catheter was placed into the left common carotid artery and then advanced into the left internal carotid artery. The diagnostic catheter was removed. Frontal and lateral angiograms of the head were obtained. FINDINGS: 1. Ultrasound showed heavily calcified right common femoral artery is maintained patency and caliber. 2. Proximal occlusion of a left M2/MCA anterior division branch. 3. Atherosclerotic changes of the intracranial left ICA with mild stenosis at the distal cavernous segment. 4. 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 PROCEDURE: Using biplane roadmap guidance, a Red 62 aspiration catheter was navigated over Colossus 35 microguidewire into the cavernous segment of the left ICA. The aspiration catheter was then advanced to the level of occlusion and connected to an aspiration pump. Continuous aspiration was performed for 2 minutes. The guide catheter was connected to a VacLok syringe and the guiding catheter balloon was inflated. The aspiration catheter was subsequently removed under constant aspiration. The guide catheter was aspirated for debris. Left internal carotid artery angiograms with frontal and lateral views of the head showed complete recanalization of the left MCA vascular tree. The guide catheter was retracted into the neck. Frontal and lateral angiograms of the neck  were obtained. Improvement of the degree of stenosis in the carotid bulb compared to prior CT angiogram. There is prominent luminal irregularity at the carotid bulb residual moderate stenosis. Increased tortuosity of the proximal/mid cervical left ICA with kinking. Left internal carotid artery angiograms with left anterior oblique views of the neck showed evidence of filling defects at the level of the carotid bulb. Flat panel CT of the head was obtained and post processed in a separate workstation with concurrent attending physician supervision. Selected images were sent to PACS. No evidence of hemorrhagic complication. There is mild contrast staining of the left insular and frontal cortex. Repeat left internal carotid artery angiograms with frontal and lateral views of the head showed Amy seen left M3/MCA branch to the left parietal region. Left common carotid artery angiograms with frontal and lateral views of the neck showed vertebra Gretchen of stenosis at the left ICA bulb with more prominent filling defect. At this point, patient was loaded on cangrelor followed by continuous drip. Using biplane roadmap guidance, a 4-7 mm Emboshield  NAV6 cerebral protection device was advanced into the cervical left ICA. However, multiple attempts to advance a cerebral protection device through the left ICA kinking proved unsuccessful. The cerebral protection device was subsequently removed. Using biplane roadmap guidance, a 10-8 x 40 mm XACT carotid stent was navigated and deployed from the distal left common carotid artery to the proximal left internal carotid artery, proximal to the vessel kinking. Suboptimal stent expansion was noted. Then, a 6 x 30 mm Viatrac balloon was navigated into the recently deployed stent. Angioplasty was performed under fluoroscopy. Left internal carotid artery angiograms with frontal and lateral views of the neck showed adequate stent positioning and expansion with brisk anterograde flow. Left  internal carotid artery angiograms with frontal and lateral views of the head showed improvement of anterograde flow in the left MCA vascular tree with brisk anterograde flow. Delayed left common carotid artery angiograms with frontal and lateral views of the neck showed no evidence of clot formation within the stent. The catheter was subsequently Riddle. Right common femoral artery angiogram was obtained in right anterior oblique view. The puncture is at the level of the common femoral artery. The artery has normal caliber, adequate for closure device. The sheath was exchanged over the wire for an 8 [place] Angio-Seal which was utilized for access closure. Immediate hemostasis was achieved. IMPRESSION: 1. Successful mechanical thrombectomy for treatment of a proximal left M2/MCA anterior division branch occlusion achieving complete recanalization (TICI 3). 2. 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 PLAN: Continue cangrelor infusion until patient is transitioned to oral dual antiplatelet therapy. Electronically Signed   By: Baldemar Lenis M.D.   On: 06/05/2023 13:16   IR CT Head Ltd Result Date: 06/05/2023 INDICATION: 83 year old male presenting with right-sided weakness and aphasia; NIHSS 28. His last known well was 10 p.m. on 06/04/2023. His past medical history significant for prior stroke, hypertension, diabetes and chronic kidney disease; baseline modified Rankin scale 0. Head CT showed hypodensity within the left insula, basal ganglia and left frontal operculum (ASPECTS 7). No IV thrombolytic given as patient was outside the window. CT angiogram of the head and neck showed an occlusion of a left M2/MCA anterior division branch. CT perfusion showed a 41 mL core infarct with a 45 mL ischemic penumbra. She was transferred to our service for mechanical  thrombectomy. EXAM: ULTRASOUND-GUIDED VASCULAR ACCESS DIAGNOSTIC CEREBRAL ANGIOGRAM MECHANICAL THROMBECTOMY FLAT PANEL HEAD CT LEFT CAROTID STENTING AND ANGIOPLASTY WITHOUT CEREBRAL PROTECTION DEVICE COMPARISON:  CT/CT angiogram of the head and neck June 05, 2023. MEDICATIONS: No antibiotics administered. ANESTHESIA/SEDATION: The procedure was performed under general anesthesia. CONTRAST:  80 mL of Omnipaque 300 milligram/mL FLUOROSCOPY: Radiation Exposure Index (as provided by the fluoroscopic device): 1315 mGy Kerma COMPLICATIONS: None immediate. TECHNIQUE: Informed written consent was obtained from the patient's wife after a thorough discussion of the procedural risks, benefits and alternatives. All questions were addressed. Maximal Sterile Barrier Technique was utilized including caps, mask, sterile gowns, sterile gloves, sterile drape, hand hygiene and skin antiseptic. A timeout was performed prior to the initiation of the procedure. The right groin was prepped and draped in the usual sterile fashion. Using a micropuncture kit and the modified Seldinger technique, access was gained to the right common femoral artery and an 8 French sheath was placed. Real-time ultrasound guidance was utilized for vascular access including the acquisition of a permanent ultrasound image documenting patency  of the accessed vessel. Under fluoroscopy, an 8 [place] Walrus balloon guide catheter was navigated over a 6 [place] VTK catheter and a 0.035" Terumo Glidewire into the aortic arch. The catheter was placed into the left common carotid artery and then advanced into the left internal carotid artery. The diagnostic catheter was removed. Frontal and lateral angiograms of the head were obtained. FINDINGS: 1. Ultrasound showed heavily calcified right common femoral artery is maintained patency and caliber. 2. Proximal occlusion of a left M2/MCA anterior division branch. 3. Atherosclerotic changes of the intracranial left ICA with  mild stenosis at the distal cavernous segment. 4. 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 PROCEDURE: Using biplane roadmap guidance, a Red 62 aspiration catheter was navigated over Colossus 35 microguidewire into the cavernous segment of the left ICA. The aspiration catheter was then advanced to the level of occlusion and connected to an aspiration pump. Continuous aspiration was performed for 2 minutes. The guide catheter was connected to a VacLok syringe and the guiding catheter balloon was inflated. The aspiration catheter was subsequently removed under constant aspiration. The guide catheter was aspirated for debris. Left internal carotid artery angiograms with frontal and lateral views of the head showed complete recanalization of the left MCA vascular tree. The guide catheter was retracted into the neck. Frontal and lateral angiograms of the neck were obtained. Improvement of the degree of stenosis in the carotid bulb compared to prior CT angiogram. There is prominent luminal irregularity at the carotid bulb residual moderate stenosis. Increased tortuosity of the proximal/mid cervical left ICA with kinking. Left internal carotid artery angiograms with left anterior oblique views of the neck showed evidence of filling defects at the level of the carotid bulb. Flat panel CT of the head was obtained and post processed in a separate workstation with concurrent attending physician supervision. Selected images were sent to PACS. No evidence of hemorrhagic complication. There is mild contrast staining of the left insular and frontal cortex. Repeat left internal carotid artery angiograms with frontal and lateral views of the head showed Amy seen left M3/MCA branch to the left parietal region. Left common carotid artery angiograms with frontal and lateral views of the neck showed vertebra Gretchen of stenosis at  the left ICA bulb with more prominent filling defect. At this point, patient was loaded on cangrelor followed by continuous drip. Using biplane roadmap guidance, a 4-7 mm Emboshield NAV6 cerebral protection device was advanced into the cervical left ICA. However, multiple attempts to advance a cerebral protection device through the left ICA kinking proved unsuccessful. The cerebral protection device was subsequently removed. Using biplane roadmap guidance, a 10-8 x 40 mm XACT carotid stent was navigated and deployed from the distal left common carotid artery to the proximal left internal carotid artery, proximal to the vessel kinking. Suboptimal stent expansion was noted. Then, a 6 x 30 mm Viatrac balloon was navigated into the recently deployed stent. Angioplasty was performed under fluoroscopy. Left internal carotid artery angiograms with frontal and lateral views of the neck showed adequate stent positioning and expansion with brisk anterograde flow. Left internal carotid artery angiograms with frontal and lateral views of the head showed improvement of anterograde flow in the left MCA vascular tree with brisk anterograde flow. Delayed left common carotid artery angiograms with frontal and lateral views of the neck showed no evidence of clot formation within the stent. The catheter was subsequently Riddle.  Right common femoral artery angiogram was obtained in right anterior oblique view. The puncture is at the level of the common femoral artery. The artery has normal caliber, adequate for closure device. The sheath was exchanged over the wire for an 8 [place] Angio-Seal which was utilized for access closure. Immediate hemostasis was achieved. IMPRESSION: 1. Successful mechanical thrombectomy for treatment of a proximal left M2/MCA anterior division branch occlusion achieving complete recanalization (TICI 3). 2. Atherosclerotic disease of the left carotid bifurcation with stenosis and clot formation suggesting  acute plaque rupture treated with stenting and angioplasty with resolution of stenosis. PLAN: Continue cangrelor infusion until patient is transitioned to oral dual antiplatelet therapy. Electronically Signed   By: Baldemar Lenis M.D.   On: 06/05/2023 13:16   CT ANGIO HEAD NECK W WO CM W PERF (CODE STROKE) Addendum Date: 06/05/2023 ADDENDUM REPORT: 06/05/2023 12:25 ADDENDUM: Please note, there is a dictation error within CTA neck impression #1, which should read: The common carotid and internal carotid arteries are patent within the neck. Atherosclerotic plaque bilaterally. Most notably, there is progressive atherosclerotic plaque about the left carotid bifurcation and within the proximal left ICA with resultant severe near occlusive stenosis of the proximal left ICA. Also of note, atherosclerotic plaque about the right carotid bifurcation results in a 40% stenosis at the right ICA origin. Electronically Signed   By: Jackey Loge D.O.   On: 06/05/2023 12:25   Result Date: 06/05/2023 CLINICAL DATA:  Provided history: Cerebrovascular accident, unspecified mechanism. Right-sided weakness. Right-sided facial droop. Altered mental status. EXAM: CT ANGIOGRAPHY HEAD AND NECK CT PERFUSION BRAIN TECHNIQUE: Multidetector CT imaging of the head and neck was performed using the standard protocol during bolus administration of intravenous contrast. Multiplanar CT image reconstructions and MIPs were obtained to evaluate the vascular anatomy. Carotid stenosis measurements (when applicable) are obtained utilizing NASCET criteria, using the distal internal carotid diameter as the denominator. Multiphase CT imaging of the brain was performed following IV bolus contrast injection. Subsequent parametric perfusion maps were calculated using RAPID software. RADIATION DOSE REDUCTION: This exam was performed according to the departmental dose-optimization program which includes automated exposure control, adjustment of  the mA and/or kV according to patient size and/or use of iterative reconstruction technique. CONTRAST:  OMNIPAQUE IOHEXOL 350 MG/ML SOLN COMPARISON:  Noncontrast head CT performed earlier today 06/05/2023. MRA head and MRA neck 10/04/2019. FINDINGS: CTA NECK FINDINGS Aortic arch: Common origin of the innominate and left common carotid arteries. Atherosclerotic plaque within the visualized thoracic aorta and proximal major branch vessels of the neck. Streak/beam hardening artifact arising from a dense right-sided contrast bolus partially obscures the right subclavian artery. Within this limitation, there is no appreciable hemodynamically significant innominate or proximal subclavian artery stenosis. Right carotid system: CCA and ICA patent within the neck. Atherosclerotic plaque, greatest about the carotid bifurcation. Resultant 40% stenosis at the ICA origin. Left carotid system: CCA and ICA patent within the neck. Atherosclerotic plaque. Most notably, there is prominent atherosclerotic plaque about the carotid bifurcation and within the proximal ICA which has progressed from the prior MRA neck of 10/04/2019. Resultant severe (near occlusive) stenosis of the proximal ICA. Tortuosity of the cervical ICA Vertebral arteries: The vertebral arteries are patent within the neck. Streak/beam hardening artifact limits evaluation of the right vertebral artery origin. At least moderate stenosis is suspected at this site. Atherosclerotic plaque scattered elsewhere within the cervical right vertebral artery with no more than mild stenosis. Calcified atherosclerotic plaque at the left vertebral  artery origin with suspected at least moderate stenosis. Nonstenotic calcified plaque elsewhere within the cervical left vertebral artery. Skeleton: Cervical spondylosis. Other neck: No neck mass or cervical lymphadenopathy. Upper chest: No consolidation within the imaged lung apices. Review of the MIP images confirms the above  findings CTA HEAD FINDINGS Anterior circulation: The intracranial internal carotid arteries are patent. As sclerotic plaque within both vessels. No more than mild stenosis on the right. Up to moderate stenosis within the left cavernous segment. The M1 middle cerebral arteries are patent. Abrupt occlusion of a proximal M2 left middle cerebral artery vessel (series 11, image 22). Atherosclerotic irregularity of the M2 and more distal MCA vessels elsewhere. The anterior cerebral arteries are patent. Atherosclerotic irregularity of both vessels without high-grade proximal stenosis. A possible 2 mm periophthalmic left ICA aneurysm with better appreciated on the prior MRA head of 10/04/2019. Posterior circulation: The intracranial vertebral arteries are patent. Atherosclerotic plaque within the right V4 segment sites of mild stenosis. Non-stenotic atherosclerotic plaque within the left V4 segment. The basilar artery is patent. The posterior cerebral arteries are patent. Posterior communicating arteries are diminutive or absent, bilaterally. Venous sinuses: Assessment for dural venous sinus thrombosis is limited due to contrast timing. Anatomic variants: As described. Review of the MIP images confirms the above findings CT Brain Perfusion Findings: ASPECTS: CBF (<30%) Volume: 41mL Perfusion (Tmax>6.0s) volume: 86mL Mismatch Volume: 45mL Infarction Location:Left MCA vascular territory CTA head impression #1, the CT perfusion head impression and the presence of a severe stenosis of the proximal cervical left ICA called by telephone at the time of interpretation on 06/05/2023 at 8:40 am to provider [facility] , who verbally acknowledged these results. IMPRESSION: CTA neck: 1. No common carotid and internal carotid arteries are patent within the neck. Atherosclerotic plaque bilaterally. Most notably, there is progressive atherosclerotic plaque about the left carotid bifurcation and within the proximal left ICA with resultant  severe, near occlusive stenosis of the proximal left ICA. Also of note, atherosclerotic plaque about the right carotid bifurcation results in 40% stenosis at the right ICA origin. 2. The vertebral arteries are patent within the neck. Atherosclerotic plaque bilaterally as described. Most notably, there is suspected at least moderate stenoses at the bilateral vertebral artery origins. 3. Aortic Atherosclerosis (ICD10-I70.0). CTA head: 1. Abrupt occlusion of a proximal M2 left middle cerebral artery vessel. 2. Background intracranial atherosclerotic disease as described. 3. A possible 2 mm periophthalmic left ICA aneurysm was better appreciated on the prior MRA head of 10/04/2019. CT perfusion head: The perfusion software identifies a 41 mL core infarct in the left MCA vascular territory. The perfusion software identifies an 86 mL region of critically hypoperfused parenchyma within the left MCA vascular territory (utilizing the Tmax>6 seconds threshold). Reported mismatch volume: 45 mL Electronically Signed: By: Jackey Loge D.O. On: 06/05/2023 09:07   CT HEAD CODE STROKE WO CONTRAST Result Date: 06/05/2023 CLINICAL DATA:  Code stroke. Neuro deficit, acute, stroke suspected. EXAM: CT HEAD WITHOUT CONTRAST TECHNIQUE: Contiguous axial images were obtained from the base of the skull through the vertex without intravenous contrast. RADIATION DOSE REDUCTION: This exam was performed according to the departmental dose-optimization program which includes automated exposure control, adjustment of the mA and/or kV according to patient size and/or use of iterative reconstruction technique. COMPARISON:  Brain MRI 10/04/2019.  Noncontrast head CT 10/03/2019. FINDINGS: Brain: Generalized cerebral atrophy. Loss of gray-white differentiation consistent with an acute infarct within the left insula and within portions of the left frontal operculum (MCA  vascular territory). Known small chronic cortically-based infarcts within the left  frontal, left parietal and left occipital lobes were better appreciated on the prior brain MRI of 10/04/2019 (acute at that time). Mild patchy and ill-defined hypoattenuation within the cerebral white matter, nonspecific but compatible with chronic small vessel ischemic disease. Subcentimeter infarct within the superior right cerebellar hemisphere, new from the prior MRI but chronic in appearance. Loss of gray-white differentiation there is no acute intracranial hemorrhage. No extra-axial fluid collection. No evidence of an intracranial mass. No midline shift. Vascular: No hyperdense vessel.  Atherosclerotic calcifications. Skull: No calvarial fracture or aggressive osseous lesion. Sinuses/Orbits: No mass or acute finding within the imaged orbits. No significant paranasal sinus disease. ASPECTS [facility] Stroke Program Early CT Score) - Ganglionic level infarction (caudate, lentiform nuclei, internal capsule, insula, M1-M3 cortex): 5 - Supraganglionic infarction (M4-M6 cortex): 2 Total score (0-10 with 10 being normal): 7 Impression #1 called by telephone at the time of interpretation on 06/05/2023 at 8:40 am to provider Dr. Otelia Limes, who verbally acknowledged these results. IMPRESSION: 1. Acute left MCA territory infarct affecting the left insula and portions of the left frontal operculum. ASPECTS is 7. 2. Known small chronic cortically-based infarcts within the left frontal, left parietal and left occipital lobes were better appreciated on the prior brain MRI of 10/04/2019 (acute at that time). 3. Background mild cerebral white matter chronic small vessel ischemic disease. 4. Subcentimeter infarct within the right cerebellar hemisphere, new from prior MRI but chronic in appearance. 5. Generalized cerebral atrophy. Electronically Signed   By: Jackey Loge D.O.   On: 06/05/2023 08:45     PHYSICAL EXAM  Temp:  [98.4 F (36.9 C)-102.5 F (39.2 C)] 98.6 F (37 C) (02/20 0800) Pulse Rate:  [85-123] 95 (02/20  0800) Resp:  [0-36] 0 (02/20 0800) BP: (117-152)/(44-78) 152/65 (02/20 0800) SpO2:  [95 %-100 %] 100 % (02/20 0800) FiO2 (%):  [35 %] 35 % (02/20 0755) Weight:  [409 kg] 133 kg (02/20 0500)  General -intubated elderly patient in no acute distress  Cardiovascular -regular rhythm on monitor  Neuro - still intubated on vent, eyes closed and not open eyes on voice, with forced eye opening, eyes midline with mild upward gaze, not blinking to visual threat bilaterally, pupils equal and round but small and sluggish, corneal reflexes present, cough and gag reflex present.  Patient does not respond to voice or follow commands.  No significant response to noxious stimuli and no spontaneous movement.   ASSESSMENT/PLAN Mr. Payden Docter. is a 83 y.o. male with history of hypertension, diabetes, CKD 3, stroke admitted for right-sided weakness numbness, aphasia, right facial droop, left gaze preference. No TNK given due to outside window.  Patient underwent mechanical thrombectomy with left ICA stenting.   2/15 in the morning patient had an episode of vomiting with coffee-ground emesis and subsequent tachycardia and tachypnea.  On evaluation this morning, he had increased work of breathing and was unable to maintain his SpO2 even on 100% O2 via nonrebreather.  Discussion was had with patient's wife and daughter, and family stated they would like to proceed with intubation and would like for patient to remain full code at this time.  CCM was consulted, and patient was transferred to ICU and intubated.  He did have an episode of aspiration during intubation and bronchoscopy was subsequently performed.  He was noted to be hypotensive after intubation, and Levophed and vasopressin were started.  Will hold Plavix for now given coffee-ground emesis.  Patient's creatinine was also noted to have worsened.  Stroke:  left MCA infarct with left M2 occlusion and left ICA near occlusion s/p IR with TICI3 and left ICA  stenting, likely secondary to large vessel disease source versus cardiomyopathy with low EF CT left MCA infarct CT head and neck left M2 occlusion, left ICA near occlusion, right ICA 43 stenosis, bilateral VA origin severe stenosis CTP 41/86 Status post IR with TICI3 and left ICA stenting MRI left MCA infarct at left insular and left frontal operculum, prominent and confluent petechial hemorrhagic transformation CT repeat 2/7 stable confluent petechial hemorrhage 2D Echo EF 30% LDL 107 HgbA1c 6.1 P2 Y12 = 73 UDS negative SCDs for VTE prophylaxis aspirin 81 mg daily and clopidogrel 75 mg daily prior to admission, now on Plavix and ASA 81mg .  Ongoing aggressive stroke risk factor management Therapy recommendations:  SNF Disposition: Pending, continue palliative care for GOC discussion  History of stroke 10/08/2019 admitted for left MCA infarct due to right upper extremity weakness.  MRA head and neck showed left ICA 30% stenosis.  EF 45 to 50%.  LDL 105, A1c 5.6.  Recommended loop recorder at that time but only got 30-day CardioNet monitoring which was no A-fib.  Discharged on DAPT and Lipitor 80.  Respiratory failure Leukocytosis On 2/15, patient had episode of emesis with probable subsequent aspiration and went into respiratory distress with increased work of breathing unable to maintain SpO2 After discussion with family, patient intubated for airway protection and given respiratory failure Now on precedex and fentanyl Ventilator management per CCM - failed SBT 2/19 Fever Tmax overnight 100.8 WBC 10.1--23.5--44.5--41.4--29.9--19.5--7.4--4.9--5.7  Continue Rocephin Off vancomycin   GI bleeding Acute blood loss anemia Patient had 2 episodes of coffee-ground emesis, likely stress ulcer Resume Plavix 2/18 Resume ASA 2/20  Hemoglobin 11.2-> 8.4->PRBC->8.8->8.1-> 7.6->8.3->8.6 Close monitoring  Cardiomyopathy CHF 09/2019 EF 45 to 50% Current admission EF 30% Cardiology on board,  appreciate assistance Was on DAPT, now on hold due to GIB Agree with GDMT (losartan, entresto, metoprolol and spironolactone) as BP and Cr tolerates May consider loop recorder placement if neuro improves significantly  Diabetes HgbA1c 6.1 goal < 7.0 Hyperglycemia improved  Now off insulin drip CBG monitoring SSI  DM education and close PCP follow up  History of hypertension, now hypotensive Stable now But requiring Levophed Off epi and vasopressin Long term BP goal normotensive  Hyperlipidemia Home meds: Lipitor 80 LDL 105, goal < 70 Now on lipitor 80 and Zetia  Continue statin and zetia at discharge  Dysphagia Poststroke dysphagia Speech on board N.p.o. now OG tube reinserted Start trickle feeds 2/18  AKI on CKD  Creatinine 1.78->3.41-> 4.61->4.07-> 3.85->3.66 Baseline creatinine around 1.5 Renally dose medications as appropriate Avoid contrast Aggressive treat hypotension  Other Stroke Risk Factors Advanced age Obesity, Body mass index is 42.07 kg/m.   Other Active Problems Presyncopal episode with mild hypotension 2/14-suspect was due to straining to have a bowel movement, will prescribe as needed suppository  Hospital day # 15

## 2023-06-20 NOTE — Progress Notes (Incomplete)
 Palliative:  HPI: 83 y.o. male with past medical history of HTN, HLD, diabetes, CKD stage 3b, prior CVA with no residual deficits, prostate cancer, failure to thrive admitted on 06/05/2023 with unresponsiveness with right-sided deficits found to have left MCA infarct with left M2 occlusion and left ICA near occlusion s/p IR with mechanical thrombectomy and stenting 2/5. 2/15 aspiration event resulting in severe shock requiring intubation. Hospitalization also complicated by acute GIB and CHF EF 30%.   I met today at Nathan Proctor' bedside after discussing with RN. I discussed with wife at bedside. Wife is feeling good about his progress. We reviewed his weaning on ventilator and weaning sedation. We discussed next step is hoping he will be able to follow commands. We did discuss concern for kidney function and ongoing close monitoring. She has had a chance to speak with the medical team. She tells me that she is feeling more optimistic today. Time for outcomes. Nathan Proctor received a phone call she was attending too so we ended conversation.   Exam: Opens eyes to stimuli. Not following commands. Tolerating vent support via ETT. Breathing regular, unlabored. Abd soft. Feet cool to touch.    Plan: - Full scope - Ongoing palliative support  25 min  Yong Channel, NP Palliative Medicine Team Pager [phone] (Please see [email or web address] for schedule) Team Phone [phone]

## 2023-06-20 NOTE — Progress Notes (Signed)
 STROKE TEAM PROGRESS NOTE   SUBJECTIVE (INTERVAL HISTORY) Wife is at the bedside. Patient still intubated, on weaning.  Off pressors.  Still on Precedex and fentanyl.  However creatinine is still elevated.  Mental status has not improved.  OBJECTIVE Temp:  [98.4 F (36.9 C)-102.5 F (39.2 C)] 99.2 F (37.3 C) (02/20 1600) Pulse Rate:  [85-112] 111 (02/20 1500) Cardiac Rhythm: Sinus tachycardia (02/20 1200) Resp:  [0-33] 33 (02/20 1500) BP: (117-171)/(44-78) 152/63 (02/20 1500) SpO2:  [97 %-100 %] 99 % (02/20 1500) FiO2 (%):  [35 %] 35 % (02/20 1503) Weight:  [133 kg] 133 kg (02/20 0500)  Recent Labs  Lab 06/19/23 2315 06/20/23 0319 06/20/23 0743 06/20/23 1128 06/20/23 1534  GLUCAP 219* 195* 145* 195* 229*   Recent Labs  Lab 06/17/23 0423 06/18/23 0124 06/19/23 0031 06/19/23 2121 06/20/23 0447 06/20/23 0823  NA 143 141 144 146* 149*  --   K 4.6 4.2 3.6 3.8 3.8  --   CL 109 107 110 112* 114*  --   CO2 20* 22 23 20* 21*  --   GLUCOSE 150* 192* 172* 257* 186*  --   BUN 67* 69* 72* 94* 102*  --   CREATININE 4.07* 3.85* 3.31* 3.66* 4.19*  --   CALCIUM 8.6* 8.3* 8.8* 8.6* 8.9  --   MG 2.1  --  2.2 2.2 2.4 2.3  PHOS 4.8*  --   --   --   --  4.6   Recent Labs  Lab 06/16/23 0414 06/17/23 0423  AST 44* 35  ALT 30 29  ALKPHOS 45 46  BILITOT 1.1 1.1  PROT 5.5* 5.4*  ALBUMIN 2.5* 2.2*    Recent Labs  Lab 06/17/23 1715 06/18/23 0758 06/19/23 0031 06/19/23 1544 06/20/23 0447  WBC 25.0* 19.5* 7.4 4.9 5.7  HGB 8.0* 7.6* 7.7* 8.3* 8.6*  HCT 25.1* 24.6* 25.2* 27.1* 28.0*  MCV 98.8 99.2 100.8* 100.0 100.7*  PLT 146* 157 118* 115* 113*       Component Value Date/Time   CHOL 183 06/06/2023 0557   TRIG 109 06/19/2023 0031   HDL 60 06/06/2023 0557   CHOLHDL 3.1 06/06/2023 0557   VLDL 16 06/06/2023 0557   LDLCALC 107 (H) 06/06/2023 0557   LDLCALC 223 (H) 05/31/2021 0000   Lab Results  Component Value Date   HGBA1C 6.1 (H) 06/05/2023      Component Value  Date/Time   LABOPIA NONE DETECTED 06/05/2023 1653   COCAINSCRNUR NONE DETECTED 06/05/2023 1653   LABBENZ NONE DETECTED 06/05/2023 1653   AMPHETMU NONE DETECTED 06/05/2023 1653   THCU NONE DETECTED 06/05/2023 1653   LABBARB NONE DETECTED 06/05/2023 1653    No results for input(s): "ETH" in the last 168 hours.   I have personally reviewed the radiological images below and agree with the radiology interpretations.  DG CHEST PORT 1 VIEW Result Date: 06/19/2023 CLINICAL DATA:  Respiratory distress. EXAM: PORTABLE CHEST 1 VIEW COMPARISON:  Radiograph 06/15/2023, CT 06/16/2023 FINDINGS: Tip of the endotracheal tube 4.5 cm from the carina. Enteric tube and left central line unchanged in positioning. Slight improved right lung aeration with persistent basilar opacity. Slight worsening patchy opacity at the left lung base. No pneumothorax or large pleural effusion. No pulmonary edema. Stable heart size and mediastinal contours. IMPRESSION: 1. Bibasilar opacities, improving on the right but worsening on the left. 2. Stable support apparatus. Electronically Signed   By: Narda Rutherford M.D.   On: 06/19/2023 16:02   DG Abd Portable  1V Result Date: 06/18/2023 CLINICAL DATA:  Orogastric tube placement. EXAM: PORTABLE ABDOMEN - 1 VIEW COMPARISON:  Radiographs 06/05/2023.  Abdominal CT 06/16/2023. FINDINGS: 1236 hours. Tip of the enteric tube projects over the right upper quadrant of the abdomen, likely in the distal stomach. There is a small amount contrast material within the stomach. The visualized bowel gas pattern is normal. Patient is rotated to the right with persistent right basilar airspace disease and a small right pleural effusion. IMPRESSION: Enteric tube tip projects over the distal stomach. Electronically Signed   By: Carey Bullocks M.D.   On: 06/18/2023 14:48   CT CHEST ABDOMEN PELVIS WO CONTRAST Result Date: 06/16/2023 CLINICAL DATA:  Septicemia.  Pneumonia. EXAM: CT CHEST, ABDOMEN AND PELVIS  WITHOUT CONTRAST TECHNIQUE: Multidetector CT imaging of the chest, abdomen and pelvis was performed following the standard protocol without IV contrast. RADIATION DOSE REDUCTION: This exam was performed according to the departmental dose-optimization program which includes automated exposure control, adjustment of the mA and/or kV according to patient size and/or use of iterative reconstruction technique. COMPARISON:  CT angio chest from 04/04/2016. FINDINGS: CT CHEST FINDINGS Cardiovascular: Heart size is normal. Aortic atherosclerosis and multi vessel coronary artery calcifications. No pericardial effusion. Mediastinum/Nodes: Thyroid gland, trachea and esophagus appear normal. ET tube is in place with tip above the carina. There is a enteric tube with tip in the stomach.No enlarged mediastinal lymph nodes. Hilar lymph nodes are suboptimally evaluated due to lack of IV contrast. Lungs/Pleura: No pleural effusion or interstitial edema. Bilateral lower lobe airspace consolidation is identified, right greater than left. Imaging findings compatible with multifocal pneumonia. Chronic subsegmental atelectasis this is within the right middle lobe and right lower lobe with asymmetric volume loss and elevation of the right hemidiaphragm. Musculoskeletal: No chest wall mass or suspicious bone lesions identified. CT ABDOMEN PELVIS FINDINGS Hepatobiliary: No focal liver abnormality. Gallbladder appears normal. No signs of bile duct dilatation. Pancreas: Unremarkable. No pancreatic ductal dilatation or surrounding inflammatory changes. Spleen: Normal in size without focal abnormality. Adrenals/Urinary Tract: Normal adrenal glands. Multiple stones identified within the upper pole of the left kidney which measure up to 7 mm. Bilateral exophytic kidney lesions of varying complexity are identified. These are suboptimally assessed on the current exam reflecting lack of IV contrast material. No signs of hydronephrosis. Urinary  bladder is decompressed around a Foley catheter. Stomach/Bowel: Enteric tube tip is in the proximal stomach. There is retained enteric contrast material within the gastric fundus. The appendix is visualized and appears normal. There are several dilated loops of small bowel within the right hemiabdomen. The proximal small bowel loops are normal in caliber. Within the right upper quadrant of the abdomen there are multiple dilated loops of small bowel measuring up to 3.5 cm containing air-fluid levels. The bowel loops proximal and distal are normal in caliber. Within the limitations of unenhanced technique there is no significant bowel wall thickening. Distal colonic diverticulosis identified without signs of acute diverticulitis. Retained enteric contrast material identified within the colon up to the rectum Vascular/Lymphatic: Aortic atherosclerosis without aneurysm. No signs of abdominopelvic adenopathy. Reproductive: Prostate gland is either atrophic or surgically absent. Penile prosthesis identified. Other: No free fluid or fluid collections. Musculoskeletal: No acute or significant osseous findings. IMPRESSION: 1. Bilateral lower lobe airspace consolidation, right greater than left. Imaging findings compatible with multifocal pneumonia. 2. Chronic subsegmental atelectasis within the right middle lobe and right lower lobe with asymmetric volume loss and elevation of the right hemidiaphragm. 3. Assessment of bowel  pathology is limited due to lack of IV and enteric contrast material. There are multiple abnormally dilated loops of small bowel within the right hemiabdomen. Findings are compatible with a small-bowel obstruction. As there is normal caliber proximal small bowel and normal caliber distal small bowel findings are concerning for either close loop obstruction or internal hernia. 4. Distal colonic diverticulosis without signs of acute diverticulitis. 5. Nonobstructing left renal calculi. 6. Bilateral  exophytic kidney lesions of varying complexity are identified. These are suboptimally assessed on the current exam reflecting lack of IV contrast material. Further evaluation with nonemergent renal ultrasound is advised. 7.  Aortic Atherosclerosis (ICD10-I70.0). Electronically Signed   By: Signa Kell M.D.   On: 06/16/2023 15:51   ECHOCARDIOGRAM LIMITED Result Date: 06/15/2023    ECHOCARDIOGRAM LIMITED REPORT   Patient Name:   Dilon Lank. Date of Exam: 06/15/2023 Medical Rec #:  [id number]              Height:       70.0 in Accession #:    [id number]             Weight:       263.9 lb Date of Birth:  [birth year]              BSA:          2.348 m Patient Age:    83 years               BP:           121/53 mmHg Patient Gender: M                      HR:           90 bpm. Exam Location:  Inpatient Procedure: Limited Echo, Color Doppler and Intracardiac Opacification Agent            (Both Spectral and Color Flow Doppler were utilized during            procedure). Indications:    Stroke i63.9, Shock  History:        Patient has prior history of Echocardiogram examinations, most                 recent 06/05/2023. CHF; Risk Factors:Hypertension and                 Dyslipidemia.  Sonographer:    Irving Burton Senior RDCS Referring Phys: [phone] [facility] ELLISON  Sonographer Comments: Technically difficult due to body habitus, scanned supine on artificial repirator IMPRESSIONS  1. Very difficult to determine LVEF to limited visualization and frequent ectopy. . Left ventricular ejection fraction, by estimation, is 40 to 45%. The left ventricle has mildly decreased function. Left ventricular endocardial border not optimally defined to evaluate regional wall motion.  2. Right ventricular systolic function was not well visualized. The right ventricular size is not well visualized.  3. The inferior vena cava is normal in size with <50% respiratory variability, suggesting right atrial pressure of 8 mmHg.  4. Limited echo  FINDINGS  Left Ventricle: Very difficult to determine LVEF to limited visualization and frequent ectopy. Left ventricular ejection fraction, by estimation, is 40 to 45%. The left ventricle has mildly decreased function. Left ventricular endocardial border not optimally defined to evaluate regional wall motion. Definity contrast agent was given IV to delineate the left ventricular endocardial borders. Right Ventricle: The right ventricular size is not well visualized. Right vetricular wall thickness  was not well visualized. Right ventricular systolic function was not well visualized. Venous: The inferior vena cava is normal in size with less than 50% respiratory variability, suggesting right atrial pressure of 8 mmHg. Additional Comments: Color Doppler performed.  Dina Rich MD Electronically signed by Dina Rich MD Signature Date/Time: 06/15/2023/4:31:38 PM    Final    DG Abd 1 View Result Date: 06/15/2023 CLINICAL DATA:  Orogastric tube placement. EXAM: ABDOMEN - 1 VIEW COMPARISON:  Radiograph 06/08/2023 FINDINGS: Tip of the enteric tube below the diaphragm in the stomach, the side port is just beyond the gastroesophageal junction. There is gaseous gastric distension. Dilated bowel in the right abdomen up 4.7 cm, potentially small bowel, but incompletely included in the field of view. IMPRESSION: 1. Tip of the enteric tube below the diaphragm in the stomach, side-port just beyond the gastroesophageal junction. 2. Gaseous gastric distension. Additional dilated bowel in the right abdomen likely small bowel, although incompletely included in the field of view. Electronically Signed   By: Narda Rutherford M.D.   On: 06/15/2023 15:06   DG CHEST PORT 1 VIEW Result Date: 06/15/2023 CLINICAL DATA:  10031 Cough 10031 EXAM: PORTABLE CHEST 1 VIEW COMPARISON:  June 08, 2023 FINDINGS: The cardiomediastinal silhouette is unchanged in contour.Unchanged elevation of the RIGHT hemidiaphragm. No pleural effusion.  No pneumothorax. Bibasilar platelike opacities. Atherosclerotic calcifications. Similar background of interstitial prominence. IMPRESSION: Bibasilar platelike opacities, favored to reflect atelectasis. Electronically Signed   By: Meda Klinefelter M.D.   On: 06/15/2023 12:07   DG CHEST PORT 1 VIEW Result Date: 06/15/2023 CLINICAL DATA:  Respiratory failure, intubation and central line placement. EXAM: PORTABLE CHEST 1 VIEW COMPARISON:  Film earlier today at 0734 hours FINDINGS: Interval intubation with the endotracheal tube tip approximately 3.5 cm above the carina. Left jugular central line placement with the line tip in the upper SVC. No pneumothorax. Volume loss of the right lung remains with elevation of the right hemidiaphragm. Improved expansion of the left lung. Potential underlying mild pulmonary interstitial edema. No significant pleural effusions. IMPRESSION: 1. Interval intubation and left jugular central line placement. No pneumothorax. 2. Improved expansion of the left lung. Persistent volume loss of the right lung with elevation of the right hemidiaphragm. Potential underlying mild pulmonary interstitial edema. Electronically Signed   By: Irish Lack M.D.   On: 06/15/2023 10:42   DG Swallowing Func-Speech Pathology Result Date: 06/12/2023 Table formatting from the original result was not included. Modified Barium Swallow Study Patient Details Name: Carmino Ocain. MRN: [id number] Date of Birth: 1940-07-05 Today's Date: 06/12/2023 HPI/PMH: HPI: Pt is a 83 y.o. male who was admitted as a code stroke on 02/05 due to acute onset of right-sided weakness, right facial droop and aphasia. MRI from 02/06 displayed an evolving acute left MCA infarct, most pronounced at the left insula and left frontal operculum. Pt has intentionally lost 300 pounds in the recent past, with pre-loss weight at ~600Ib. PMHx includes hypertension, diabetes, CKD, prior CVA. Clinical Impression: Clinical Impression: Pt  presents with a mild-moderate oropharyngeal dysphagia (DIGEST Score: 1) primarily characterized by silent penetration of thin liquid at level of VFs, flash penetration of nectar-thick above VFs, and diffuse pharyngeal residue. Pt's dysphagia severity level dropped from DIGEST score of 3 to 1, compared to previous MBS eval on 02/07.     Pharyngeal residue was present throughout all consistencies. Pt's posture for swallowing was consistently neutral. Pt follows commands for compensatory strategies more readily now with max multimodal cueing. Effective  compensatory strategies include multiple swallows, throat clearing, and a straw when used with a verbally cued slow rate. Multiple consecutive sips of thin-liquid presented the most risk for aspiration due to silent penetration of Vfs. Unlike previous MBS study, pt tolerated solid consistency, displaying prolonged mastication but majority clearance of bolus after 2-3 swallows.     Recommendations include upgrading pt's diet to dysphagia 2 (finely chopped) and thin liquids to optimize safety and efficiency for swallowing. SLP f/u to monitor tolerance of thin-liquids with compensatory strategies is highly recommended. Pt needs to remain upright for 30 minutes after PO intake due to remaining pharyngeal residue to optimize airway safety. Factors that may increase risk of adverse event in presence of aspiration [name] & Clearance [name] 2021): Factors that may increase risk of adverse event in presence of aspiration [name] & Clearance [name] 2021): Dependence for feeding and/or oral hygiene Recommendations/Plan: Swallowing Evaluation Recommendations Swallowing Evaluation Recommendations Recommendations: PO diet PO Diet Recommendation: Dysphagia 2 (Finely chopped); Thin liquids (Level 0) Liquid Administration via: Straw Medication Administration: Crushed with puree Supervision: Full supervision/cueing for swallowing strategies; Full assist for feeding Swallowing strategies  : Slow rate;  Small bites/sips; Multiple dry swallows after each bite/sip; Clear throat intermittently Postural changes: Position pt fully upright for meals; Stay upright 30-60 min after meals Oral care recommendations: Oral care BID (2x/day); Staff/trained caregiver to provide oral care Treatment Plan Treatment Plan Follow-up recommendations: Follow physicians's recommendations for discharge plan and follow up therapies Functional status assessment: Patient has had a recent decline in their functional status and demonstrates the ability to make significant improvements in function in a reasonable and predictable amount of time. Recommendations Recommendations for follow up therapy are one component of a multi-disciplinary discharge planning process, led by the attending physician.  Recommendations may be updated based on patient status, additional functional criteria and insurance authorization. Assessment: Orofacial Exam: Orofacial Exam Oral Cavity: Oral Hygiene: WFL Oral Cavity - Dentition: Adequate natural dentition Orofacial Anatomy: WFL Anatomy: Anatomy: WFL Boluses Administered: Boluses Administered Boluses Administered: Thin liquids (Level 0); Mildly thick liquids (Level 2, nectar thick); Moderately thick liquids (Level 3, honey thick); Puree; Solid  Oral Impairment Domain: Oral Impairment Domain Lip Closure: Interlabial escape, no progression to anterior lip Tongue control during bolus hold: Cohesive bolus between tongue to palatal seal Bolus preparation/mastication: Slow prolonged chewing/mashing with complete recollection Bolus transport/lingual motion: Brisk tongue motion Oral residue: Residue collection on oral structures Location of oral residue : Tongue Initiation of pharyngeal swallow : Pyriform sinuses  Pharyngeal Impairment Domain: Pharyngeal Impairment Domain Soft palate elevation: No bolus between soft palate (SP)/pharyngeal wall (PW) Laryngeal elevation: Partial superior movement of thyroid cartilage/partial  approximation of arytenoids to epiglottic petiole Anterior hyoid excursion: Complete anterior movement Epiglottic movement: Complete inversion Laryngeal vestibule closure: Incomplete, narrow column air/contrast in laryngeal vestibule Pharyngeal stripping wave : Present - complete Pharyngeal contraction (A/P view only): N/A Pharyngoesophageal segment opening: Complete distension and complete duration, no obstruction of flow Tongue base retraction: Trace column of contrast or air between tongue base and PPW Pharyngeal residue: Collection of residue within or on pharyngeal structures Location of pharyngeal residue: Diffuse (>3 areas)  Esophageal Impairment Domain: Esophageal Impairment Domain Esophageal clearance upright position: -- (Not tested) Pill: No data recorded Penetration/Aspiration Scale Score: Penetration/Aspiration Scale Score 1.  Material does not enter airway: Solid; Puree; Moderately thick liquids (Level 3, honey thick) 2.  Material enters airway, remains ABOVE vocal cords then ejected out: Mildly thick liquids (Level 2, nectar thick) 5.  Material enters airway,  CONTACTS cords and not ejected out: Thin liquids (Level 0) Compensatory Strategies: Compensatory Strategies Compensatory strategies: Yes Straw: Effective Effective Straw: Mildly thick liquid (Level 2, nectar thick) (Effective with slow rate applied.) Multiple swallows: Effective Effective Multiple Swallows: Thin liquid (Level 0); Mildly thick liquid (Level 2, nectar thick); Moderately thick liquid (Level 3, honey thick); Puree; Solid   General Information: Caregiver present: No  Diet Prior to this Study: Dysphagia 1 (pureed); Moderately thick liquids (Level 3, honey thick)   Temperature : Normal   Respiratory Status: WFL   Supplemental O2: None (Room air)   History of Recent Intubation: Yes  Behavior/Cognition: Alert; Cooperative; Pleasant mood; Requires cueing Self-Feeding Abilities: Needs assist with self-feeding Baseline vocal quality/speech:  Normal Volitional Cough: Unable to elicit Volitional Swallow: Able to elicit Exam Limitations: No limitations Goal Planning: Prognosis for improved oropharyngeal function: Good Barriers to Reach Goals: Language deficits No data recorded No data recorded No data recorded Pain: Pain Assessment Pain Assessment: No/denies pain Pain Score: 0 End of Session: Start Time:SLP Start Time (ACUTE ONLY): 1310 Stop Time: SLP Stop Time (ACUTE ONLY): 1335 Time Calculation:SLP Time Calculation (min) (ACUTE ONLY): 25 min Charges: SLP Evaluations $ SLP Speech Visit: 1 Visit SLP Evaluations $MBS Swallow: 1 Procedure $Swallowing Treatment: 1 Procedure SLP visit diagnosis: SLP Visit Diagnosis: Dysphagia, oropharyngeal phase (R13.12) Past Medical History: Past Medical History: Diagnosis Date  Anemia of chronic disease   Ankle fracture 02/15/2016  Cancer (HCC)   Prostate  Chronic constipation   Chronic kidney disease   stage III  Diabetes mellitus without complication (HCC)   type II   Diabetic peripheral neuropathy (HCC)   Failure to thrive (0-17)   Fracture of left lower leg   Gout   Hyperlipidemia   Hypertension   Morbid obesity (HCC)   Unstable gait  Past Surgical History: Past Surgical History: Procedure Laterality Date  IR CT HEAD LTD  06/05/2023  IR INTRAVSC STENT CERV CAROTID W/O EMB-PROT MOD SED INC ANGIO  06/05/2023  IR PERCUTANEOUS ART THROMBECTOMY/INFUSION INTRACRANIAL INC DIAG ANGIO  06/05/2023  IR US GUIDE VASC ACCESS RIGHT  06/05/2023  ORIF ANKLE FRACTURE Left 02/29/2016  Procedure: OPEN REDUCTION INTERNAL FIXATION (ORIF) ANKLE FRACTURE;  Surgeon: Yolonda Kida, MD;  Location: [facility];  Service: Orthopedics;  Laterality: Left;  PROSTATE SURGERY    RADIOLOGY WITH ANESTHESIA N/A 06/05/2023  Procedure: IR WITH ANESTHESIA;  Surgeon: Radiologist, Medication, MD;  Location: [facility];  Service: Radiology;  Laterality: N/A; Claudine Mouton 06/12/2023, 3:16 PM  DG Abd 1 View Result Date: 06/09/2023 CLINICAL DATA:  [id number]. Encounter  for feeding tube placement. EXAM: ABDOMEN - 1 VIEW COMPARISON:  Abdomen film 06/05/2023. FINDINGS: Dobbhoff feeding tube is well placed with the radiopaque tip in the distal stomach. The visualized bowel pattern is nonobstructive. There is barium newly seen in the flexures and transverse colon. There is no supine evidence of free air. There is atelectasis in the lung bases. Cardiomegaly. IMPRESSION: 1. Dobbhoff feeding tube well placed with the radiopaque tip in the distal stomach. 2. Nonobstructive bowel gas pattern. 3. Cardiomegaly. Electronically Signed   By: Almira Bar M.D.   On: 06/09/2023 03:41   DG CHEST PORT 1 VIEW Result Date: 06/08/2023 CLINICAL DATA:  CHF EXAM: PORTABLE CHEST 1 VIEW COMPARISON:  01/06/2023 FINDINGS: Feeding tube in place. There is cardiomegaly with vascular congestion. Chronic elevation of the right hemidiaphragm with right base atelectasis. Interstitial prominence throughout the lungs, right greater than left could reflect interstitial edema. Possible small  effusions. No acute bony abnormality. IMPRESSION: Cardiomegaly with vascular congestion and probable mild interstitial edema. Question small bilateral effusions. Chronic elevation of the right hemidiaphragm with right base atelectasis. Electronically Signed   By: Charlett Nose M.D.   On: 06/08/2023 18:20   DG Swallowing Func-Speech Pathology Result Date: 06/07/2023 Table formatting from the original result was not included. Modified Barium Swallow Study Study completed and documented by [name], SLP Student Supervised and reviewed by Harlon Ditty, MA CCC-SLP [facility] Secure Chat Preferred Office [phone] Patient Details Name: Haidan Nhan. MRN: [id number] Date of Birth: [birth year] Today's Date: 06/07/2023 HPI/PMH: HPI: Pt is a 83 y.o. male who was admitted as a code stroke on 02/05 due to acute onset of right-sided weakness, right facial droop and aphasia. MRI from 02/06 displayed an  evolving acute left MCA infarct, most pronounced at the left insula and left frontal operculum. Pt has intentionally lost 300 pounds in the recent past, with pre-loss weight at ~600Ib. PMHx includes hypertension, diabetes, CKD, prior CVA. Clinical Impression: Clinical Impression: Pt presented with a moderate-severe oropharyngeal dysphagia (DIGEST Score: 3) primarily characterized by silent aspiration of thin liquid, silent penetration of nectar-thick liquid, diffuse pharyngeal residue collection during thin/nectar-thick consistencies, and posterior-escape of bolus across multiple consistencies. Pt positioning was a potential limitation for examination due to consistent posterior head position. More anterior head positioning during nectar-thick liquid displayed less pharyngeal residue, increased hyo-laryngeal excursion, and overall better airway safety. This positioning was not present throughout due to pt's receptive language deficits to understand cues. Alternating between honey-thick and puree consistencies provided more oropharyngeal bolus clearance and no signs of aspiration. Recommend a dysphagia 1 (puree) and honey-thick liquid diet to optimize pt's airway safety and potential for meeting nutritional needs. Slow rate, small bites/sips, natural head posturing, and multiple swallows are compensatory strategies that displayed best efficiency with pt. F/u with SLP for family education, compensatory strategy training, and upgraded diet trials is recommended. Factors that may increase risk of adverse event in presence of aspiration [name] & Clearance [name] 2021): Factors that may increase risk of adverse event in presence of aspiration [name] & Clearance [name] 2021): Weak cough Recommendations/Plan: Swallowing Evaluation Recommendations Swallowing Evaluation Recommendations Recommendations: PO diet PO Diet Recommendation: Dysphagia 1 (Pureed); Moderately thick liquids (Level 3, honey thick) Liquid Administration via: Spoon  Medication Administration: Crushed with puree Supervision: Full supervision/cueing for swallowing strategies; Full assist for feeding Swallowing strategies  : Slow rate; Small bites/sips; Multiple dry swallows after each bite/sip (Pt needs to be in a neutral head position (no posterior head tilt).) Postural changes: Position pt fully upright for meals; Stay upright 30-60 min after meals Oral care recommendations: Oral care BID (2x/day); Staff/trained caregiver to provide oral care Caregiver Recommendations: Have oral suction available Treatment Plan Treatment Plan Treatment recommendations: Therapy as outlined in treatment plan below Follow-up recommendations: Follow physicians's recommendations for discharge plan and follow up therapies Functional status assessment: Patient has had a recent decline in their functional status and demonstrates the ability to make significant improvements in function in a reasonable and predictable amount of time. Treatment frequency: Min 2x/week Treatment duration: 2 weeks Interventions: Aspiration precaution training; Compensatory techniques; Patient/family education; Diet toleration management by SLP; Trials of upgraded texture/liquids Recommendations Recommendations for follow up therapy are one component of a multi-disciplinary discharge planning process, led by the attending physician.  Recommendations may be updated based on patient status, additional functional criteria and insurance authorization. Assessment: Orofacial Exam: Orofacial Exam Oral Cavity - Dentition: Adequate  natural dentition Orofacial Anatomy: WFL Anatomy: Anatomy: WFL Boluses Administered: Boluses Administered Boluses Administered: Thin liquids (Level 0); Mildly thick liquids (Level 2, nectar thick); Moderately thick liquids (Level 3, honey thick); Puree; Solid  Oral Impairment Domain: Oral Impairment Domain Lip Closure: Escape progressing to mid-chin Tongue control during bolus hold: Posterior escape of  greater than half of bolus Bolus preparation/mastication: -- (Pt did not chew solid and SLP had to remove it from mouth.) Bolus transport/lingual motion: Repetitive/disorganized tongue motion Oral residue: Residue collection on oral structures Location of oral residue : Tongue; Palate Initiation of pharyngeal swallow : Pyriform sinuses  Pharyngeal Impairment Domain: Pharyngeal Impairment Domain Soft palate elevation: No bolus between soft palate (SP)/pharyngeal wall (PW) Laryngeal elevation: Partial superior movement of thyroid cartilage/partial approximation of arytenoids to epiglottic petiole Anterior hyoid excursion: Partial anterior movement Epiglottic movement: Partial inversion Laryngeal vestibule closure: Incomplete, narrow column air/contrast in laryngeal vestibule Pharyngeal stripping wave : Present - complete Pharyngeal contraction (A/P view only): N/A Pharyngoesophageal segment opening: Complete distension and complete duration, no obstruction of flow Tongue base retraction: Narrow column of contrast or air between tongue base and PPW Pharyngeal residue: Collection of residue within or on pharyngeal structures Location of pharyngeal residue: Diffuse (>3 areas)  Esophageal Impairment Domain: Esophageal Impairment Domain Esophageal clearance upright position: -- (Not tested.) Pill: No data recorded Penetration/Aspiration Scale Score: Penetration/Aspiration Scale Score 1.  Material does not enter airway: Puree; Moderately thick liquids (Level 3, honey thick) 3.  Material enters airway, remains ABOVE vocal cords and not ejected out: Mildly thick liquids (Level 2, nectar thick) 8.  Material enters airway, passes BELOW cords without attempt by patient to eject out (silent aspiration) : Thin liquids (Level 0) Compensatory Strategies: Compensatory Strategies Compensatory strategies: Yes Straw: Ineffective Multiple swallows: Effective (Required max cueing due to pt's language.) Effective Multiple Swallows: Puree;  Moderately thick liquid (Level 3, honey thick); Mildly thick liquid (Level 2, nectar thick)   General Information: Caregiver present: No  Diet Prior to this Study: NPO   Temperature : Normal   Respiratory Status: WFL   Supplemental O2: None (Room air)   No data recorded Behavior/Cognition: Alert; Cooperative; Pleasant mood; Requires cueing Self-Feeding Abilities: Needs assist with self-feeding Baseline vocal quality/speech: Dysphonic Volitional Cough: Unable to elicit Volitional Swallow: Able to elicit Exam Limitations: Poor positioning Goal Planning: Prognosis for improved oropharyngeal function: Good Barriers to Reach Goals: Language deficits No data recorded No data recorded Consulted and agree with results and recommendations: Pt unable/family or caregiver not available Pain: Pain Assessment Pain Assessment: No/denies pain Faces Pain Scale: 0 Facial Expression: 0 Body Movements: 0 Muscle Tension: 0 Compliance with ventilator (intubated pts.): N/A Vocalization (extubated pts.): 0 CPOT Total: 0 End of Session: Start Time:SLP Start Time (ACUTE ONLY): 0903 Stop Time: SLP Stop Time (ACUTE ONLY): 0930 Time Calculation:SLP Time Calculation (min) (ACUTE ONLY): 27 min Charges: SLP Evaluations $ SLP Speech Visit: 1 Visit SLP Evaluations $BSS Swallow: 1 Procedure $MBS Swallow: 1 Procedure $ SLP EVAL LANGUAGE/SOUND PRODUCTION: 1 Procedure $Swallowing Treatment: 1 Procedure SLP visit diagnosis: SLP Visit Diagnosis: Dysphagia, oropharyngeal phase (R13.12) Past Medical History: Past Medical History: Diagnosis Date  Anemia of chronic disease   Ankle fracture 02/15/2016  Cancer (HCC)   Prostate  Chronic constipation   Chronic kidney disease   stage III  Diabetes mellitus without complication (HCC)   type II   Diabetic peripheral neuropathy (HCC)   Failure to thrive (0-17)   Fracture of left lower leg   Gout  Hyperlipidemia   Hypertension   Morbid obesity (HCC)   Unstable gait  Past Surgical History: Past Surgical History:  Procedure Laterality Date  IR CT HEAD LTD  06/05/2023  IR INTRAVSC STENT CERV CAROTID W/O EMB-PROT MOD SED INC ANGIO  06/05/2023  IR PERCUTANEOUS ART THROMBECTOMY/INFUSION INTRACRANIAL INC DIAG ANGIO  06/05/2023  IR US GUIDE VASC ACCESS RIGHT  06/05/2023  ORIF ANKLE FRACTURE Left 02/29/2016  Procedure: OPEN REDUCTION INTERNAL FIXATION (ORIF) ANKLE FRACTURE;  Surgeon: Yolonda Kida, MD;  Location: [facility];  Service: Orthopedics;  Laterality: Left;  PROSTATE SURGERY    RADIOLOGY WITH ANESTHESIA N/A 06/05/2023  Procedure: IR WITH ANESTHESIA;  Surgeon: Radiologist, Medication, MD;  Location: [facility];  Service: Radiology;  Laterality: N/A; [place], Riley Nearing 06/07/2023, 12:15 PM  CT HEAD WO CONTRAST ( ) Result Date: 06/07/2023 CLINICAL DATA:  83 year old male status post code stroke presentation, left MCA M2 occlusion, with left MCA infarct with confluent petechial hemorrhage. EXAM: CT HEAD WITHOUT CONTRAST TECHNIQUE: Contiguous axial images were obtained from the base of the skull through the vertex without intravenous contrast. RADIATION DOSE REDUCTION: This exam was performed according to the departmental dose-optimization program which includes automated exposure control, adjustment of the mA and/or kV according to patient size and/or use of iterative reconstruction technique. COMPARISON:  Brain MRI yesterday.  Head CT 06/05/2023. FINDINGS: Brain: Confluent mixed density infarct in the left MCA middle division, epicenter at the insula and operculum. Confluent petechial hemorrhage on series 3, image 16 appears stable from the MRI. Superimposed trace subarachnoid blood is difficult to exclude by CT (series 3, image 13), but was not apparent on MRI. Stable mild mass effect on the left lateral ventricle with only trace rightward midline shift (series 3, image 16). No ventriculomegaly. Elsewhere gray-white differentiation is stable from the presentation CT. Basilar cisterns remain patent. Vascular: Resolved hyperdense  left MCA since presentation. Calcified atherosclerosis at the skull base. Skull: Stable, intact. Sinuses/Orbits: Visualized paranasal sinuses and mastoids are stable and well aerated. Other: Left nasoenteric tube now in place. Stable orbit and scalp soft tissues. IMPRESSION: 1. Stable by CT confluent Left MCA middle division infarct with petechial hemorrhage (Heidelberg classification 1b: HI2, confluent petechiae, no mass effect). Questionable trace superimposed SAH, but was not apparent on MRI. 2. Stable minimal intracranial mass effect. 3. No new intracranial abnormality. Electronically Signed   By: Odessa Fleming M.D.   On: 06/07/2023 05:37   MR BRAIN WO CONTRAST Result Date: 06/06/2023 CLINICAL DATA:  Follow-up examination for stroke. EXAM: MRI HEAD WITHOUT CONTRAST TECHNIQUE: Multiplanar, multiecho pulse sequences of the brain and surrounding structures were obtained without intravenous contrast. COMPARISON:  Comparison made to multiple previous exams from 06/05/2023. FINDINGS: Brain: Examination degraded by motion artifact. Cerebral volume within normal limits for age. Patchy T2/FLAIR hyperintensity involving the periventricular deep white matter both cerebral hemispheres, consistent with chronic small vessel ischemic disease, mild in nature. Small remote infarct noted within the right cerebellum. Confluent restricted diffusion involving the left insula and left frontal operculum, consistent with evolving acute left MCA distribution infarct. Area of infarction measures up to approximately 6 cm in AP diameter. Prominent associated susceptibility artifact, consistent with petechial hemorrhage (series 7, image 61) ( Heidelberg classification 1b: HI2, confluent petechiae, no mass effect. No frank or organized hematoma evident by MRI. No significant regional mass effect. Few additional small foci of infarction noted within the left parieto-occipital region posteriorly (series 3, images 40, 29). Apparent diffusion signal  along the right frontal parafalcine region felt  to be consistent with artifact related to dural calcification. Note made of a few additional chronic micro hemorrhages within the left cerebellum and right thalamus. No mass lesion or midline shift. Ventricles normal size without hydrocephalus. No extra-axial fluid collection. Pituitary gland suprasellar region within normal limits. Vascular: Major intracranial vascular flow voids are maintained. Skull and upper cervical spine: Craniocervical junction within normal limits. Bone marrow signal intensity overall within normal limits. No scalp soft tissue abnormality. Sinuses/Orbits: Prior bilateral ocular lens replacement. Paranasal sinuses are clear. No mastoid effusion. Other: None. IMPRESSION: 1. Evolving acute left MCA distribution infarct, most pronounced at the left insula and left frontal operculum. Prominent associated petechial hemorrhage without frank intraparenchymal hematoma (Heidelberg classification 1b: HI2, confluent petechiae, no mass effect). 2. Few additional small foci of acute infarction within the left parieto-occipital region posteriorly. 3. Underlying mild chronic microvascular ischemic disease with small remote right cerebellar infarct. Electronically Signed   By: Rise Mu M.D.   On: 06/06/2023 03:12   ECHOCARDIOGRAM COMPLETE Result Date: 06/05/2023    ECHOCARDIOGRAM REPORT   Patient Name:   Neo Yepiz. Date of Exam: 06/05/2023 Medical Rec #:  [id number]              Height:       70.0 in Accession #:    [id number]             Weight:       292.3 lb Date of Birth:  27-Jul-1940              BSA:          2.453 m Patient Age:    82 years               BP:           129/72 mmHg Patient Gender: M                      HR:           89 bpm. Exam Location:  Inpatient Procedure: 2D Echo, Color Doppler, Cardiac Doppler and Intracardiac            Opacification Agent Indications:    Stroke I63.9  History:        Patient has prior history  of Echocardiogram examinations, most                 recent 10/06/2019. Risk Factors:Diabetes, Hypertension and                 Dyslipidemia.  Sonographer:    Harriette Bouillon RDCS Referring Phys: Lynnae January IMPRESSIONS  1. Left ventricular ejection fraction, by estimation, is 30%. The left ventricle has moderately decreased function. The left ventricle demonstrates global hypokinesis. There is mild left ventricular hypertrophy.  2. Right ventricular systolic function is normal. The right ventricular size is normal.  3. Trivial mitral valve regurgitation.  4. The aortic valve is tricuspid. Aortic valve regurgitation is not visualized.  5. The inferior vena cava is normal in size with greater than 50% respiratory variability, suggesting right atrial pressure of 3 mmHg. Comparison(s): The left ventricular function is worsened. FINDINGS  Left Ventricle: Left ventricular ejection fraction, by estimation, is 30%. The left ventricle has moderately decreased function. The left ventricle demonstrates global hypokinesis. Definity contrast agent was given IV to delineate the left ventricular endocardial borders. The left ventricular internal cavity size was normal in size. There is mild left ventricular hypertrophy. Right  Ventricle: The right ventricular size is normal. Right vetricular wall thickness was not assessed. Right ventricular systolic function is normal. Left Atrium: Left atrial size was normal in size. Right Atrium: Right atrial size was normal in size. Pericardium: There is no evidence of pericardial effusion. Mitral Valve: There is mild thickening of the mitral valve leaflet(s). Mild to moderate mitral annular calcification. Trivial mitral valve regurgitation. Tricuspid Valve: The tricuspid valve is normal in structure. Tricuspid valve regurgitation is trivial. Aortic Valve: The aortic valve is tricuspid. Aortic valve regurgitation is not visualized. Pulmonic Valve: The pulmonic valve was normal in structure.  Pulmonic valve regurgitation is not visualized. Aorta: The aortic root and ascending aorta are structurally normal, with no evidence of dilitation. Venous: The inferior vena cava is normal in size with greater than 50% respiratory variability, suggesting right atrial pressure of 3 mmHg. IAS/Shunts: The interatrial septum was not assessed.  LEFT VENTRICLE PLAX 2D LVIDd:         5.00 cm   Diastology LVIDs:         4.40 cm   LV e' lateral: 5.33 cm/s LV PW:         1.20 cm LV IVS:        1.10 cm LVOT diam:     2.30 cm LV SV:         55 LV SV Index:   23 LVOT Area:     4.15 cm  RIGHT VENTRICLE RV S prime:     14.40 cm/s LEFT ATRIUM         Index LA diam:    4.60 cm 1.88 cm/m  AORTIC VALVE LVOT Vmax:   65.50 cm/s LVOT Vmean:  45.200 cm/s LVOT VTI:    0.133 m  AORTA Ao Root diam: 3.00 cm Ao Asc diam:  3.50 cm  SHUNTS Systemic VTI:  0.13 m Systemic Diam: 2.30 cm Dietrich Pates MD Electronically signed by Dietrich Pates MD Signature Date/Time: 06/05/2023/9:33:38 PM    Final    DG Abd Portable 1V Result Date: 06/05/2023 CLINICAL DATA:  Feeding tube placement. EXAM: PORTABLE ABDOMEN - 1 VIEW COMPARISON:  None Available. FINDINGS: Distal tip of feeding tube is seen in expected position of distal stomach. IMPRESSION: Distal tip of feeding tube seen in expected position of distal stomach. Electronically Signed   By: Lupita Raider M.D.   On: 06/05/2023 15:49   CT HEAD WO CONTRAST Result Date: 06/05/2023 CLINICAL DATA:  Stroke, follow-up. Status post intracranial mechanical thrombectomy and stenting of the left ICA bifurcation. EXAM: CT HEAD WITHOUT CONTRAST TECHNIQUE: Contiguous axial images were obtained from the base of the skull through the vertex without intravenous contrast. RADIATION DOSE REDUCTION: This exam was performed according to the departmental dose-optimization program which includes automated exposure control, adjustment of the mA and/or kV according to patient size and/or use of iterative reconstruction technique.  COMPARISON:  CT head without contrast and CT angio head and neck 06/05/2023. By plain CT 06/05/2023. FINDINGS: Brain: The study is mildly degraded by patient motion. The left insular and left opercular infarct is somewhat obscured by patient motion. The cortex is slightly hyperdense, potentially reflecting reperfusion. No hemorrhage is present. Basal ganglia are intact. Subcortical white matter hypoattenuation in the high left frontal lobe is new. Mild right-sided white matter disease is stable. Basal ganglia are intact. A remote lacunar infarct is again noted in the right cerebellum. The brainstem and cerebellum are otherwise within normal limits. Vascular: Atherosclerotic calcifications are present within the cavernous  internal carotid arteries and at the normal origin of both vertebral arteries. No hyperdense vessel is present. Skull: Calvarium is intact. No focal lytic or blastic lesions are present. No significant extracranial soft tissue lesion is present. Sinuses/Orbits: The paranasal sinuses and mastoid air cells are clear. Bilateral lens replacements are noted. Globes and orbits are otherwise unremarkable. IMPRESSION: 1. The left insular and left opercular infarct is somewhat obscured by patient motion. 2. The cortex is slightly hyperdense, potentially reflecting reperfusion. 3. No hemorrhage. 4. Subcortical white matter hypoattenuation in the high left frontal lobe is new. This may reflect ischemic changes or edema related to the reperfusion. 5. Remote lacunar infarct of the right cerebellum. 6. Stable mild right-sided white matter disease. This likely reflects the sequela of chronic microvascular ischemia. Electronically Signed   By: Marin Roberts M.D.   On: 06/05/2023 14:38   IR PERCUTANEOUS ART THROMBECTOMY/INFUSION INTRACRANIAL INC DIAG ANGIO Result Date: 06/05/2023 INDICATION: 83 year old male presenting with right-sided weakness and aphasia; NIHSS 28. His last known well was 10 p.m. on  06/04/2023. His past medical history significant for prior stroke, hypertension, diabetes and chronic kidney disease; baseline modified Rankin scale 0. Head CT showed hypodensity within the left insula, basal ganglia and left frontal operculum (ASPECTS 7). No IV thrombolytic given as patient was outside the window. CT angiogram of the head and neck showed an occlusion of a left M2/MCA anterior division branch. CT perfusion showed a 41 mL core infarct with a 45 mL ischemic penumbra. She was transferred to our service for mechanical thrombectomy. EXAM: ULTRASOUND-GUIDED VASCULAR ACCESS DIAGNOSTIC CEREBRAL ANGIOGRAM MECHANICAL THROMBECTOMY FLAT PANEL HEAD CT LEFT CAROTID STENTING AND ANGIOPLASTY WITHOUT CEREBRAL PROTECTION DEVICE COMPARISON:  CT/CT angiogram of the head and neck June 05, 2023. MEDICATIONS: No antibiotics administered. ANESTHESIA/SEDATION: The procedure was performed under general anesthesia. CONTRAST:  80 mL of Omnipaque 300 milligram/mL FLUOROSCOPY: Radiation Exposure Index (as provided by the fluoroscopic device): 1315 mGy Kerma COMPLICATIONS: None immediate. TECHNIQUE: Informed written consent was obtained from the patient's wife after a thorough discussion of the procedural risks, benefits and alternatives. All questions were addressed. Maximal Sterile Barrier Technique was utilized including caps, mask, sterile gowns, sterile gloves, sterile drape, hand hygiene and skin antiseptic. A timeout was performed prior to the initiation of the procedure. The right groin was prepped and draped in the usual sterile fashion. Using a micropuncture kit and the modified Seldinger technique, access was gained to the right common femoral artery and an 8 French sheath was placed. Real-time ultrasound guidance was utilized for vascular access including the acquisition of a permanent ultrasound image documenting patency of the accessed vessel. Under fluoroscopy, an 8 [place] Walrus balloon guide catheter was  navigated over a 6 [place] VTK catheter and a 0.035" Terumo Glidewire into the aortic arch. The catheter was placed into the left common carotid artery and then advanced into the left internal carotid artery. The diagnostic catheter was removed. Frontal and lateral angiograms of the head were obtained. FINDINGS: 1. Ultrasound showed heavily calcified right common femoral artery is maintained patency and caliber. 2. Proximal occlusion of a left M2/MCA anterior division branch. 3. Atherosclerotic changes of the intracranial left ICA with mild stenosis at the distal cavernous segment. 4. 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 PROCEDURE: Using biplane roadmap guidance, a Red 62 aspiration catheter was navigated over Colossus 35 microguidewire into the cavernous segment of the left ICA.  The aspiration catheter was then advanced to the level of occlusion and connected to an aspiration pump. Continuous aspiration was performed for 2 minutes. The guide catheter was connected to a VacLok syringe and the guiding catheter balloon was inflated. The aspiration catheter was subsequently removed under constant aspiration. The guide catheter was aspirated for debris. Left internal carotid artery angiograms with frontal and lateral views of the head showed complete recanalization of the left MCA vascular tree. The guide catheter was retracted into the neck. Frontal and lateral angiograms of the neck were obtained. Improvement of the degree of stenosis in the carotid bulb compared to prior CT angiogram. There is prominent luminal irregularity at the carotid bulb residual moderate stenosis. Increased tortuosity of the proximal/mid cervical left ICA with kinking. Left internal carotid artery angiograms with left anterior oblique views of the neck showed evidence of filling defects at the level of the carotid bulb. Flat panel CT  of the head was obtained and post processed in a separate workstation with concurrent attending physician supervision. Selected images were sent to PACS. No evidence of hemorrhagic complication. There is mild contrast staining of the left insular and frontal cortex. Repeat left internal carotid artery angiograms with frontal and lateral views of the head showed Amy seen left M3/MCA branch to the left parietal region. Left common carotid artery angiograms with frontal and lateral views of the neck showed vertebra Gretchen of stenosis at the left ICA bulb with more prominent filling defect. At this point, patient was loaded on cangrelor followed by continuous drip. Using biplane roadmap guidance, a 4-7 mm Emboshield NAV6 cerebral protection device was advanced into the cervical left ICA. However, multiple attempts to advance a cerebral protection device through the left ICA kinking proved unsuccessful. The cerebral protection device was subsequently removed. Using biplane roadmap guidance, a 10-8 x 40 mm XACT carotid stent was navigated and deployed from the distal left common carotid artery to the proximal left internal carotid artery, proximal to the vessel kinking. Suboptimal stent expansion was noted. Then, a 6 x 30 mm Viatrac balloon was navigated into the recently deployed stent. Angioplasty was performed under fluoroscopy. Left internal carotid artery angiograms with frontal and lateral views of the neck showed adequate stent positioning and expansion with brisk anterograde flow. Left internal carotid artery angiograms with frontal and lateral views of the head showed improvement of anterograde flow in the left MCA vascular tree with brisk anterograde flow. Delayed left common carotid artery angiograms with frontal and lateral views of the neck showed no evidence of clot formation within the stent. The catheter was subsequently Riddle. Right common femoral artery angiogram was obtained in right anterior oblique  view. The puncture is at the level of the common femoral artery. The artery has normal caliber, adequate for closure device. The sheath was exchanged over the wire for an 8 [place] Angio-Seal which was utilized for access closure. Immediate hemostasis was achieved. IMPRESSION: 1. Successful mechanical thrombectomy for treatment of a proximal left M2/MCA anterior division branch occlusion achieving complete recanalization (TICI 3). 2. 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 PLAN: Continue cangrelor infusion until patient is transitioned to oral dual antiplatelet therapy. Electronically Signed   By: Baldemar Lenis M.D.   On: 06/05/2023 13:16   IR US Guide Vasc Access Right Result Date: 06/05/2023 INDICATION: 83 year old male presenting with right-sided weakness and aphasia; NIHSS 28.  His last known well was 10 p.m. on 06/04/2023. His past medical history significant for prior stroke, hypertension, diabetes and chronic kidney disease; baseline modified Rankin scale 0. Head CT showed hypodensity within the left insula, basal ganglia and left frontal operculum (ASPECTS 7). No IV thrombolytic given as patient was outside the window. CT angiogram of the head and neck showed an occlusion of a left M2/MCA anterior division branch. CT perfusion showed a 41 mL core infarct with a 45 mL ischemic penumbra. She was transferred to our service for mechanical thrombectomy. EXAM: ULTRASOUND-GUIDED VASCULAR ACCESS DIAGNOSTIC CEREBRAL ANGIOGRAM MECHANICAL THROMBECTOMY FLAT PANEL HEAD CT LEFT CAROTID STENTING AND ANGIOPLASTY WITHOUT CEREBRAL PROTECTION DEVICE COMPARISON:  CT/CT angiogram of the head and neck June 05, 2023. MEDICATIONS: No antibiotics administered. ANESTHESIA/SEDATION: The procedure was performed under general anesthesia. CONTRAST:  80 mL of Omnipaque 300  milligram/mL FLUOROSCOPY: Radiation Exposure Index (as provided by the fluoroscopic device): 1315 mGy Kerma COMPLICATIONS: None immediate. TECHNIQUE: Informed written consent was obtained from the patient's wife after a thorough discussion of the procedural risks, benefits and alternatives. All questions were addressed. Maximal Sterile Barrier Technique was utilized including caps, mask, sterile gowns, sterile gloves, sterile drape, hand hygiene and skin antiseptic. A timeout was performed prior to the initiation of the procedure. The right groin was prepped and draped in the usual sterile fashion. Using a micropuncture kit and the modified Seldinger technique, access was gained to the right common femoral artery and an 8 French sheath was placed. Real-time ultrasound guidance was utilized for vascular access including the acquisition of a permanent ultrasound image documenting patency of the accessed vessel. Under fluoroscopy, an 8 [place] Walrus balloon guide catheter was navigated over a 6 [place] VTK catheter and a 0.035" Terumo Glidewire into the aortic arch. The catheter was placed into the left common carotid artery and then advanced into the left internal carotid artery. The diagnostic catheter was removed. Frontal and lateral angiograms of the head were obtained. FINDINGS: 1. Ultrasound showed heavily calcified right common femoral artery is maintained patency and caliber. 2. Proximal occlusion of a left M2/MCA anterior division branch. 3. Atherosclerotic changes of the intracranial left ICA with mild stenosis at the distal cavernous segment. 4. 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 PROCEDURE: Using biplane roadmap guidance, a Red 62 aspiration catheter was navigated over Colossus 35 microguidewire into the cavernous segment of the left ICA. The aspiration catheter was then advanced to the level of  occlusion and connected to an aspiration pump. Continuous aspiration was performed for 2 minutes. The guide catheter was connected to a VacLok syringe and the guiding catheter balloon was inflated. The aspiration catheter was subsequently removed under constant aspiration. The guide catheter was aspirated for debris. Left internal carotid artery angiograms with frontal and lateral views of the head showed complete recanalization of the left MCA vascular tree. The guide catheter was retracted into the neck. Frontal and lateral angiograms of the neck were obtained. Improvement of the degree of stenosis in the carotid bulb compared to prior CT angiogram. There is prominent luminal irregularity at the carotid bulb residual moderate stenosis. Increased tortuosity of the proximal/mid cervical left ICA with kinking. Left internal carotid artery angiograms with left anterior oblique views of the neck showed evidence of filling defects at the level of the carotid bulb. Flat panel CT of the head was obtained and post processed in a separate  workstation with concurrent attending physician supervision. Selected images were sent to PACS. No evidence of hemorrhagic complication. There is mild contrast staining of the left insular and frontal cortex. Repeat left internal carotid artery angiograms with frontal and lateral views of the head showed Amy seen left M3/MCA branch to the left parietal region. Left common carotid artery angiograms with frontal and lateral views of the neck showed vertebra Gretchen of stenosis at the left ICA bulb with more prominent filling defect. At this point, patient was loaded on cangrelor followed by continuous drip. Using biplane roadmap guidance, a 4-7 mm Emboshield NAV6 cerebral protection device was advanced into the cervical left ICA. However, multiple attempts to advance a cerebral protection device through the left ICA kinking proved unsuccessful. The cerebral protection device was  subsequently removed. Using biplane roadmap guidance, a 10-8 x 40 mm XACT carotid stent was navigated and deployed from the distal left common carotid artery to the proximal left internal carotid artery, proximal to the vessel kinking. Suboptimal stent expansion was noted. Then, a 6 x 30 mm Viatrac balloon was navigated into the recently deployed stent. Angioplasty was performed under fluoroscopy. Left internal carotid artery angiograms with frontal and lateral views of the neck showed adequate stent positioning and expansion with brisk anterograde flow. Left internal carotid artery angiograms with frontal and lateral views of the head showed improvement of anterograde flow in the left MCA vascular tree with brisk anterograde flow. Delayed left common carotid artery angiograms with frontal and lateral views of the neck showed no evidence of clot formation within the stent. The catheter was subsequently Riddle. Right common femoral artery angiogram was obtained in right anterior oblique view. The puncture is at the level of the common femoral artery. The artery has normal caliber, adequate for closure device. The sheath was exchanged over the wire for an 8 [place] Angio-Seal which was utilized for access closure. Immediate hemostasis was achieved. IMPRESSION: 1. Successful mechanical thrombectomy for treatment of a proximal left M2/MCA anterior division branch occlusion achieving complete recanalization (TICI 3). 2. 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 PLAN: Continue cangrelor infusion until patient is transitioned to oral dual antiplatelet therapy. Electronically Signed   By: Baldemar Lenis M.D.   On: 06/05/2023 13:16   IR INTRAVSC STENT CERV CAROTID W/O EMB-PROT MOD SED Result Date: 06/05/2023 INDICATION: 83 year old male presenting with right-sided  weakness and aphasia; NIHSS 28. His last known well was 10 p.m. on 06/04/2023. His past medical history significant for prior stroke, hypertension, diabetes and chronic kidney disease; baseline modified Rankin scale 0. Head CT showed hypodensity within the left insula, basal ganglia and left frontal operculum (ASPECTS 7). No IV thrombolytic given as patient was outside the window. CT angiogram of the head and neck showed an occlusion of a left M2/MCA anterior division branch. CT perfusion showed a 41 mL core infarct with a 45 mL ischemic penumbra. She was transferred to our service for mechanical thrombectomy. EXAM: ULTRASOUND-GUIDED VASCULAR ACCESS DIAGNOSTIC CEREBRAL ANGIOGRAM MECHANICAL THROMBECTOMY FLAT PANEL HEAD CT LEFT CAROTID STENTING AND ANGIOPLASTY WITHOUT CEREBRAL PROTECTION DEVICE COMPARISON:  CT/CT angiogram of the head and neck June 05, 2023. MEDICATIONS: No antibiotics administered. ANESTHESIA/SEDATION: The procedure was performed under general anesthesia. CONTRAST:  80 mL of Omnipaque 300 milligram/mL FLUOROSCOPY: Radiation Exposure Index (as provided by the fluoroscopic device): 1315 mGy Kerma COMPLICATIONS: None immediate. TECHNIQUE: Informed written consent  was obtained from the patient's wife after a thorough discussion of the procedural risks, benefits and alternatives. All questions were addressed. Maximal Sterile Barrier Technique was utilized including caps, mask, sterile gowns, sterile gloves, sterile drape, hand hygiene and skin antiseptic. A timeout was performed prior to the initiation of the procedure. The right groin was prepped and draped in the usual sterile fashion. Using a micropuncture kit and the modified Seldinger technique, access was gained to the right common femoral artery and an 8 French sheath was placed. Real-time ultrasound guidance was utilized for vascular access including the acquisition of a permanent ultrasound image documenting patency of the accessed vessel.  Under fluoroscopy, an 8 [place] Walrus balloon guide catheter was navigated over a 6 [place] VTK catheter and a 0.035" Terumo Glidewire into the aortic arch. The catheter was placed into the left common carotid artery and then advanced into the left internal carotid artery. The diagnostic catheter was removed. Frontal and lateral angiograms of the head were obtained. FINDINGS: 1. Ultrasound showed heavily calcified right common femoral artery is maintained patency and caliber. 2. Proximal occlusion of a left M2/MCA anterior division branch. 3. Atherosclerotic changes of the intracranial left ICA with mild stenosis at the distal cavernous segment. 4. 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 PROCEDURE: Using biplane roadmap guidance, a Red 62 aspiration catheter was navigated over Colossus 35 microguidewire into the cavernous segment of the left ICA. The aspiration catheter was then advanced to the level of occlusion and connected to an aspiration pump. Continuous aspiration was performed for 2 minutes. The guide catheter was connected to a VacLok syringe and the guiding catheter balloon was inflated. The aspiration catheter was subsequently removed under constant aspiration. The guide catheter was aspirated for debris. Left internal carotid artery angiograms with frontal and lateral views of the head showed complete recanalization of the left MCA vascular tree. The guide catheter was retracted into the neck. Frontal and lateral angiograms of the neck were obtained. Improvement of the degree of stenosis in the carotid bulb compared to prior CT angiogram. There is prominent luminal irregularity at the carotid bulb residual moderate stenosis. Increased tortuosity of the proximal/mid cervical left ICA with kinking. Left internal carotid artery angiograms with left anterior oblique views of the neck showed evidence of  filling defects at the level of the carotid bulb. Flat panel CT of the head was obtained and post processed in a separate workstation with concurrent attending physician supervision. Selected images were sent to PACS. No evidence of hemorrhagic complication. There is mild contrast staining of the left insular and frontal cortex. Repeat left internal carotid artery angiograms with frontal and lateral views of the head showed Amy seen left M3/MCA branch to the left parietal region. Left common carotid artery angiograms with frontal and lateral views of the neck showed vertebra Gretchen of stenosis at the left ICA bulb with more prominent filling defect. At this point, patient was loaded on cangrelor followed by continuous drip. Using biplane roadmap guidance, a 4-7 mm Emboshield NAV6 cerebral protection device was advanced into the cervical left ICA. However, multiple attempts to advance a cerebral protection device through the left ICA kinking proved unsuccessful. The cerebral protection device was subsequently removed. Using biplane roadmap guidance, a 10-8 x 40 mm XACT carotid stent was navigated and deployed from the distal left common carotid artery to the proximal left internal carotid artery, proximal to  the vessel kinking. Suboptimal stent expansion was noted. Then, a 6 x 30 mm Viatrac balloon was navigated into the recently deployed stent. Angioplasty was performed under fluoroscopy. Left internal carotid artery angiograms with frontal and lateral views of the neck showed adequate stent positioning and expansion with brisk anterograde flow. Left internal carotid artery angiograms with frontal and lateral views of the head showed improvement of anterograde flow in the left MCA vascular tree with brisk anterograde flow. Delayed left common carotid artery angiograms with frontal and lateral views of the neck showed no evidence of clot formation within the stent. The catheter was subsequently Riddle. Right common  femoral artery angiogram was obtained in right anterior oblique view. The puncture is at the level of the common femoral artery. The artery has normal caliber, adequate for closure device. The sheath was exchanged over the wire for an 8 [place] Angio-Seal which was utilized for access closure. Immediate hemostasis was achieved. IMPRESSION: 1. Successful mechanical thrombectomy for treatment of a proximal left M2/MCA anterior division branch occlusion achieving complete recanalization (TICI 3). 2. 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 PLAN: Continue cangrelor infusion until patient is transitioned to oral dual antiplatelet therapy. Electronically Signed   By: Baldemar Lenis M.D.   On: 06/05/2023 13:16   IR CT Head Ltd Result Date: 06/05/2023 INDICATION: 83 year old male presenting with right-sided weakness and aphasia; NIHSS 28. His last known well was 10 p.m. on 06/04/2023. His past medical history significant for prior stroke, hypertension, diabetes and chronic kidney disease; baseline modified Rankin scale 0. Head CT showed hypodensity within the left insula, basal ganglia and left frontal operculum (ASPECTS 7). No IV thrombolytic given as patient was outside the window. CT angiogram of the head and neck showed an occlusion of a left M2/MCA anterior division branch. CT perfusion showed a 41 mL core infarct with a 45 mL ischemic penumbra. She was transferred to our service for mechanical thrombectomy. EXAM: ULTRASOUND-GUIDED VASCULAR ACCESS DIAGNOSTIC CEREBRAL ANGIOGRAM MECHANICAL THROMBECTOMY FLAT PANEL HEAD CT LEFT CAROTID STENTING AND ANGIOPLASTY WITHOUT CEREBRAL PROTECTION DEVICE COMPARISON:  CT/CT angiogram of the head and neck June 05, 2023. MEDICATIONS: No antibiotics administered. ANESTHESIA/SEDATION: The procedure was performed under  general anesthesia. CONTRAST:  80 mL of Omnipaque 300 milligram/mL FLUOROSCOPY: Radiation Exposure Index (as provided by the fluoroscopic device): 1315 mGy Kerma COMPLICATIONS: None immediate. TECHNIQUE: Informed written consent was obtained from the patient's wife after a thorough discussion of the procedural risks, benefits and alternatives. All questions were addressed. Maximal Sterile Barrier Technique was utilized including caps, mask, sterile gowns, sterile gloves, sterile drape, hand hygiene and skin antiseptic. A timeout was performed prior to the initiation of the procedure. The right groin was prepped and draped in the usual sterile fashion. Using a micropuncture kit and the modified Seldinger technique, access was gained to the right common femoral artery and an 8 French sheath was placed. Real-time ultrasound guidance was utilized for vascular access including the acquisition of a permanent ultrasound image documenting patency of the accessed vessel. Under fluoroscopy, an 8 [place] Walrus balloon guide catheter was navigated over a 6 [place] VTK catheter and a 0.035" Terumo Glidewire into the aortic arch. The catheter was placed into the left common carotid artery and then advanced into the left internal carotid artery. The diagnostic catheter was removed. Frontal and lateral angiograms of the head were obtained. FINDINGS: 1. Ultrasound  showed heavily calcified right common femoral artery is maintained patency and caliber. 2. Proximal occlusion of a left M2/MCA anterior division branch. 3. Atherosclerotic changes of the intracranial left ICA with mild stenosis at the distal cavernous segment. 4. 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 PROCEDURE: Using biplane roadmap guidance, a Red 62 aspiration catheter was navigated over Colossus 35 microguidewire into the cavernous segment of the left ICA. The  aspiration catheter was then advanced to the level of occlusion and connected to an aspiration pump. Continuous aspiration was performed for 2 minutes. The guide catheter was connected to a VacLok syringe and the guiding catheter balloon was inflated. The aspiration catheter was subsequently removed under constant aspiration. The guide catheter was aspirated for debris. Left internal carotid artery angiograms with frontal and lateral views of the head showed complete recanalization of the left MCA vascular tree. The guide catheter was retracted into the neck. Frontal and lateral angiograms of the neck were obtained. Improvement of the degree of stenosis in the carotid bulb compared to prior CT angiogram. There is prominent luminal irregularity at the carotid bulb residual moderate stenosis. Increased tortuosity of the proximal/mid cervical left ICA with kinking. Left internal carotid artery angiograms with left anterior oblique views of the neck showed evidence of filling defects at the level of the carotid bulb. Flat panel CT of the head was obtained and post processed in a separate workstation with concurrent attending physician supervision. Selected images were sent to PACS. No evidence of hemorrhagic complication. There is mild contrast staining of the left insular and frontal cortex. Repeat left internal carotid artery angiograms with frontal and lateral views of the head showed Amy seen left M3/MCA branch to the left parietal region. Left common carotid artery angiograms with frontal and lateral views of the neck showed vertebra Gretchen of stenosis at the left ICA bulb with more prominent filling defect. At this point, patient was loaded on cangrelor followed by continuous drip. Using biplane roadmap guidance, a 4-7 mm Emboshield NAV6 cerebral protection device was advanced into the cervical left ICA. However, multiple attempts to advance a cerebral protection device through the left ICA kinking proved  unsuccessful. The cerebral protection device was subsequently removed. Using biplane roadmap guidance, a 10-8 x 40 mm XACT carotid stent was navigated and deployed from the distal left common carotid artery to the proximal left internal carotid artery, proximal to the vessel kinking. Suboptimal stent expansion was noted. Then, a 6 x 30 mm Viatrac balloon was navigated into the recently deployed stent. Angioplasty was performed under fluoroscopy. Left internal carotid artery angiograms with frontal and lateral views of the neck showed adequate stent positioning and expansion with brisk anterograde flow. Left internal carotid artery angiograms with frontal and lateral views of the head showed improvement of anterograde flow in the left MCA vascular tree with brisk anterograde flow. Delayed left common carotid artery angiograms with frontal and lateral views of the neck showed no evidence of clot formation within the stent. The catheter was subsequently Riddle. Right common femoral artery angiogram was obtained in right anterior oblique view. The puncture is at the level of the common femoral artery. The artery has normal caliber, adequate for closure device. The sheath was exchanged over the wire for an 8 [place] Angio-Seal which was utilized for access closure. Immediate hemostasis was achieved. IMPRESSION: 1. Successful mechanical thrombectomy for treatment of a proximal left M2/MCA  anterior division branch occlusion achieving complete recanalization (TICI 3). 2. 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 PLAN: Continue cangrelor infusion until patient is transitioned to oral dual antiplatelet therapy. Electronically Signed   By: Baldemar Lenis M.D.   On: 06/05/2023 13:16   CT ANGIO HEAD NECK W WO CM W PERF (CODE STROKE) Addendum Date: 06/05/2023 ADDENDUM  REPORT: 06/05/2023 12:25 ADDENDUM: Please note, there is a dictation error within CTA neck impression #1, which should read: The common carotid and internal carotid arteries are patent within the neck. Atherosclerotic plaque bilaterally. Most notably, there is progressive atherosclerotic plaque about the left carotid bifurcation and within the proximal left ICA with resultant severe near occlusive stenosis of the proximal left ICA. Also of note, atherosclerotic plaque about the right carotid bifurcation results in a 40% stenosis at the right ICA origin. Electronically Signed   By: Jackey Loge D.O.   On: 06/05/2023 12:25   Result Date: 06/05/2023 CLINICAL DATA:  Provided history: Cerebrovascular accident, unspecified mechanism. Right-sided weakness. Right-sided facial droop. Altered mental status. EXAM: CT ANGIOGRAPHY HEAD AND NECK CT PERFUSION BRAIN TECHNIQUE: Multidetector CT imaging of the head and neck was performed using the standard protocol during bolus administration of intravenous contrast. Multiplanar CT image reconstructions and MIPs were obtained to evaluate the vascular anatomy. Carotid stenosis measurements (when applicable) are obtained utilizing NASCET criteria, using the distal internal carotid diameter as the denominator. Multiphase CT imaging of the brain was performed following IV bolus contrast injection. Subsequent parametric perfusion maps were calculated using RAPID software. RADIATION DOSE REDUCTION: This exam was performed according to the departmental dose-optimization program which includes automated exposure control, adjustment of the mA and/or kV according to patient size and/or use of iterative reconstruction technique. CONTRAST:  OMNIPAQUE IOHEXOL 350 MG/ML SOLN COMPARISON:  Noncontrast head CT performed earlier today 06/05/2023. MRA head and MRA neck 10/04/2019. FINDINGS: CTA NECK FINDINGS Aortic arch: Common origin of the innominate and left common carotid arteries.  Atherosclerotic plaque within the visualized thoracic aorta and proximal major branch vessels of the neck. Streak/beam hardening artifact arising from a dense right-sided contrast bolus partially obscures the right subclavian artery. Within this limitation, there is no appreciable hemodynamically significant innominate or proximal subclavian artery stenosis. Right carotid system: CCA and ICA patent within the neck. Atherosclerotic plaque, greatest about the carotid bifurcation. Resultant 40% stenosis at the ICA origin. Left carotid system: CCA and ICA patent within the neck. Atherosclerotic plaque. Most notably, there is prominent atherosclerotic plaque about the carotid bifurcation and within the proximal ICA which has progressed from the prior MRA neck of 10/04/2019. Resultant severe (near occlusive) stenosis of the proximal ICA. Tortuosity of the cervical ICA Vertebral arteries: The vertebral arteries are patent within the neck. Streak/beam hardening artifact limits evaluation of the right vertebral artery origin. At least moderate stenosis is suspected at this site. Atherosclerotic plaque scattered elsewhere within the cervical right vertebral artery with no more than mild stenosis. Calcified atherosclerotic plaque at the left vertebral artery origin with suspected at least moderate stenosis. Nonstenotic calcified plaque elsewhere within the cervical left vertebral artery. Skeleton: Cervical spondylosis. Other neck: No neck mass or cervical lymphadenopathy. Upper chest: No consolidation within the imaged lung apices. Review of the MIP images confirms the above findings CTA HEAD FINDINGS Anterior circulation: The intracranial internal carotid arteries are patent. As sclerotic plaque within both vessels. No  more than mild stenosis on the right. Up to moderate stenosis within the left cavernous segment. The M1 middle cerebral arteries are patent. Abrupt occlusion of a proximal M2 left middle cerebral artery vessel  (series 11, image 22). Atherosclerotic irregularity of the M2 and more distal MCA vessels elsewhere. The anterior cerebral arteries are patent. Atherosclerotic irregularity of both vessels without high-grade proximal stenosis. A possible 2 mm periophthalmic left ICA aneurysm with better appreciated on the prior MRA head of 10/04/2019. Posterior circulation: The intracranial vertebral arteries are patent. Atherosclerotic plaque within the right V4 segment sites of mild stenosis. Non-stenotic atherosclerotic plaque within the left V4 segment. The basilar artery is patent. The posterior cerebral arteries are patent. Posterior communicating arteries are diminutive or absent, bilaterally. Venous sinuses: Assessment for dural venous sinus thrombosis is limited due to contrast timing. Anatomic variants: As described. Review of the MIP images confirms the above findings CT Brain Perfusion Findings: ASPECTS: CBF (<30%) Volume: 41mL Perfusion (Tmax>6.0s) volume: 86mL Mismatch Volume: 45mL Infarction Location:Left MCA vascular territory CTA head impression #1, the CT perfusion head impression and the presence of a severe stenosis of the proximal cervical left ICA called by telephone at the time of interpretation on 06/05/2023 at 8:40 am to provider [facility] , who verbally acknowledged these results. IMPRESSION: CTA neck: 1. No common carotid and internal carotid arteries are patent within the neck. Atherosclerotic plaque bilaterally. Most notably, there is progressive atherosclerotic plaque about the left carotid bifurcation and within the proximal left ICA with resultant severe, near occlusive stenosis of the proximal left ICA. Also of note, atherosclerotic plaque about the right carotid bifurcation results in 40% stenosis at the right ICA origin. 2. The vertebral arteries are patent within the neck. Atherosclerotic plaque bilaterally as described. Most notably, there is suspected at least moderate stenoses at the bilateral  vertebral artery origins. 3. Aortic Atherosclerosis (ICD10-I70.0). CTA head: 1. Abrupt occlusion of a proximal M2 left middle cerebral artery vessel. 2. Background intracranial atherosclerotic disease as described. 3. A possible 2 mm periophthalmic left ICA aneurysm was better appreciated on the prior MRA head of 10/04/2019. CT perfusion head: The perfusion software identifies a 41 mL core infarct in the left MCA vascular territory. The perfusion software identifies an 86 mL region of critically hypoperfused parenchyma within the left MCA vascular territory (utilizing the Tmax>6 seconds threshold). Reported mismatch volume: 45 mL Electronically Signed: By: Jackey Loge D.O. On: 06/05/2023 09:07   CT HEAD CODE STROKE WO CONTRAST Result Date: 06/05/2023 CLINICAL DATA:  Code stroke. Neuro deficit, acute, stroke suspected. EXAM: CT HEAD WITHOUT CONTRAST TECHNIQUE: Contiguous axial images were obtained from the base of the skull through the vertex without intravenous contrast. RADIATION DOSE REDUCTION: This exam was performed according to the departmental dose-optimization program which includes automated exposure control, adjustment of the mA and/or kV according to patient size and/or use of iterative reconstruction technique. COMPARISON:  Brain MRI 10/04/2019.  Noncontrast head CT 10/03/2019. FINDINGS: Brain: Generalized cerebral atrophy. Loss of gray-white differentiation consistent with an acute infarct within the left insula and within portions of the left frontal operculum (MCA vascular territory). Known small chronic cortically-based infarcts within the left frontal, left parietal and left occipital lobes were better appreciated on the prior brain MRI of 10/04/2019 (acute at that time). Mild patchy and ill-defined hypoattenuation within the cerebral white matter, nonspecific but compatible with chronic small vessel ischemic disease. Subcentimeter infarct within the superior right cerebellar hemisphere, new from  the prior MRI but chronic  in appearance. Loss of gray-white differentiation there is no acute intracranial hemorrhage. No extra-axial fluid collection. No evidence of an intracranial mass. No midline shift. Vascular: No hyperdense vessel.  Atherosclerotic calcifications. Skull: No calvarial fracture or aggressive osseous lesion. Sinuses/Orbits: No mass or acute finding within the imaged orbits. No significant paranasal sinus disease. ASPECTS [facility] Stroke Program Early CT Score) - Ganglionic level infarction (caudate, lentiform nuclei, internal capsule, insula, M1-M3 cortex): 5 - Supraganglionic infarction (M4-M6 cortex): 2 Total score (0-10 with 10 being normal): 7 Impression #1 called by telephone at the time of interpretation on 06/05/2023 at 8:40 am to provider Dr. Otelia Limes, who verbally acknowledged these results. IMPRESSION: 1. Acute left MCA territory infarct affecting the left insula and portions of the left frontal operculum. ASPECTS is 7. 2. Known small chronic cortically-based infarcts within the left frontal, left parietal and left occipital lobes were better appreciated on the prior brain MRI of 10/04/2019 (acute at that time). 3. Background mild cerebral white matter chronic small vessel ischemic disease. 4. Subcentimeter infarct within the right cerebellar hemisphere, new from prior MRI but chronic in appearance. 5. Generalized cerebral atrophy. Electronically Signed   By: Jackey Loge D.O.   On: 06/05/2023 08:45     PHYSICAL EXAM  Temp:  [98.4 F (36.9 C)-102.5 F (39.2 C)] 99.2 F (37.3 C) (02/20 1600) Pulse Rate:  [85-112] 111 (02/20 1500) Resp:  [0-33] 33 (02/20 1500) BP: (117-171)/(44-78) 152/63 (02/20 1500) SpO2:  [97 %-100 %] 99 % (02/20 1500) FiO2 (%):  [35 %] 35 % (02/20 1503) Weight:  [621 kg] 133 kg (02/20 0500)  General -intubated elderly patient in no acute distress  Cardiovascular -regular rhythm on monitor  Neuro - still intubated on vent, eyes closed and not open  eyes on voice, with forced eye opening, eyes midline with mild upward gaze, not blinking to visual threat bilaterally, pupils equal and round but small and sluggish, corneal reflexes present, cough and gag reflex present.  Patient does not respond to voice or follow commands.  No significant response to noxious stimuli and no spontaneous movement.   ASSESSMENT/PLAN Mr. Dontreal Miera. is a 83 y.o. male with history of hypertension, diabetes, CKD 3, stroke admitted for right-sided weakness numbness, aphasia, right facial droop, left gaze preference. No TNK given due to outside window.  Patient underwent mechanical thrombectomy with left ICA stenting.   2/15 in the morning patient had an episode of vomiting with coffee-ground emesis and subsequent tachycardia and tachypnea.  On evaluation this morning, he had increased work of breathing and was unable to maintain his SpO2 even on 100% O2 via nonrebreather.  Discussion was had with patient's wife and daughter, and family stated they would like to proceed with intubation and would like for patient to remain full code at this time.  CCM was consulted, and patient was transferred to ICU and intubated.  He did have an episode of aspiration during intubation and bronchoscopy was subsequently performed.  He was noted to be hypotensive after intubation, and Levophed and vasopressin were started.  Will hold Plavix for now given coffee-ground emesis.  Patient's creatinine was also noted to have worsened.  Stroke:  left MCA infarct with left M2 occlusion and left ICA near occlusion s/p IR with TICI3 and left ICA stenting, likely secondary to large vessel disease source versus cardiomyopathy with low EF CT left MCA infarct CT head and neck left M2 occlusion, left ICA near occlusion, right ICA 43 stenosis, bilateral VA origin  severe stenosis CTP 41/86 Status post IR with TICI3 and left ICA stenting MRI left MCA infarct at left insular and left frontal operculum,  prominent and confluent petechial hemorrhagic transformation CT repeat 2/7 stable confluent petechial hemorrhage 2D Echo EF 30% LDL 107 HgbA1c 6.1 P2 Y12 = 73 UDS negative SCDs for VTE prophylaxis aspirin 81 mg daily and clopidogrel 75 mg daily prior to admission, now on ASA and Plavix with stable hemoglobin Ongoing aggressive stroke risk factor management Therapy recommendations:  SNF Disposition: Pending   History of stroke 10/08/2019 admitted for left MCA infarct due to right upper extremity weakness.  MRA head and neck showed left ICA 30% stenosis.  EF 45 to 50%.  LDL 105, A1c 5.6.  Recommended loop recorder at that time but only got 30-day CardioNet monitoring which was no A-fib.  Discharged on DAPT and Lipitor 80.  Respiratory failure Leukocytosis On 2/15, patient had episode of emesis with probable subsequent aspiration and went into respiratory distress with increased work of breathing unable to maintain SpO2 After discussion with family, patient intubated for airway protection and given respiratory failure Now on precedex and fentanyl Ventilator management per CCM - failed SBT 2/19 Fever Tmax 102.5 WBC 10.1--23.5--44.5--41.4--29.9--19.5--7.4--4.9--5.7 Now on cefepime-> Rocephin Off vancomycin  GI bleeding Acute blood loss anemia Patient had 2 episodes of coffee-ground emesis, likely stress ulcer Resume Plavix 2/18 Will resume ASA 2/20 if Hb stable Hemoglobin 11.2-> 8.4->PRBC->8.8->8.1-> 7.6->8.3-> 8.6 Close monitoring  Cardiomyopathy CHF 09/2019 EF 45 to 50% Current admission EF 30% Cardiology on board, appreciate assistance Was on DAPT, now on hold due to GIB Agree with GDMT (losartan, entresto, metoprolol and spironolactone) as BP and Cr tolerates May consider loop recorder placement if neuro improves significantly  Diabetes HgbA1c 6.1 goal < 7.0 Hyperglycemia improved  Now off insulin drip CBG monitoring SSI  DM education and close PCP follow up  History  of hypertension, now hypotensive Stable now But requiring Levophed Off epi and vasopressin Long term BP goal normotensive  Hyperlipidemia Home meds: Lipitor 80 LDL 105, goal < 70 Now on lipitor 80 and Zetia  Continue statin and zetia at discharge  Dysphagia Poststroke dysphagia Speech on board N.p.o. now OG tube reinserted Start trickle feeds 2/18  AKI on CKD  Creatinine 1.78->3.41-> 4.61->4.07-> 3.85->3.66->4.19 Baseline creatinine around 1.5 Renally dose medications as appropriate Avoid contrast Aggressive treat hypotension  Other Stroke Risk Factors Advanced age Obesity, Body mass index is 42.07 kg/m.   Other Active Problems Presyncopal episode with mild hypotension 2/14-suspect was due to straining to have a bowel movement, will prescribe as needed suppository  Hospital day # 15    Marvel Plan, MD PhD Stroke Neurology 06/20/2023 5:35 PM  This patient is critically ill due to stroke status post IR, respiratory failure, hypotension needing pressor, AKI, aspiration pneumonia and GI bleeding and at significant risk of neurological worsening, death form sepsis, septic shock, severe anemia, heart failure and renal failure. This patient's care requires constant monitoring of vital signs, hemodynamics, respiratory and cardiac monitoring, review of multiple databases, neurological assessment, discussion with family, other specialists and medical decision making of high complexity. I spent 35 minutes of neurocritical care time in the care of this patient. I had long discussion with wife at bedside, updated pt current condition, treatment plan and potential prognosis, and answered all the questions. She expressed understanding and appreciation.

## 2023-06-20 NOTE — Progress Notes (Signed)
 NAME:  Nathan Proctor., MRN:  [id number], DOB:  04-Dec-1940, LOS: 15 ADMISSION DATE:  06/05/2023 CONSULTATION DATE:  06/05/2023 REFERRING MD:  Otelia Limes - Neuro, CHIEF COMPLAINT: Code Stroke, post-NIR   History of Present Illness:  83 year old man who presented to [facility] ED 2/5 via EMS for unresponsiveness and R-sided deficits. PMHx significant for HTN, HLD, prior CVA without residual deficits, T2DM, CKD stage IIIb, prostate CA, gout.  Patient presented to [facility] ED with significant R-sided deficits, aphasia and poor responsiveness. LKW 2200. Code Stroke initiated. On ED arrival, patient was afebrile with HR 86, BP 154/67, RR 20, SpO2 100%. Noted aphasia, R-sided hemiplegia, R-sided facial droop. Labs were notable for WBC 6.8. Hgb 11.9, Plt 181. INR 1.0. Na 142, K 3.8, CO2 20, Cr 1.59 (baseline), LFTs WNL. Ethanol < 10. COVID negative. CT Head with acute L MCA territory infarct affecting L insula/L frontal operculum, small chronic cortically-based infarcts of L frontal/L parietal/L occipital lobes. CTA Head/Neck with abrupt occlusion of proximal M2 L MCA vessel, ?2mm periophthalmic L ICA aneurysm, bilateral ICA plaque with progressive atherosclerotic plaque L carotid bifurcation with proximal L ICA severe, near-occlusive stenosis.   Taken to [facility] emergently for mechanical thrombectomy ([name]). TICI 3 revascularization achieved. L ICA stenosis and plaque rupture noted at the bulb, treated with stenting/angioplasty. Extubated post-procedure and maintained on cangrelor gtt with plan for transition to DAPT.  PCCM consulted for post-procedure medical management.  Pertinent Medical History:   Past Medical History:  Diagnosis Date   Anemia of chronic disease    Ankle fracture 02/15/2016   Cancer (HCC)    Prostate   Chronic constipation    Chronic kidney disease    stage III   Diabetes mellitus without complication (HCC)    type II    Diabetic peripheral neuropathy (HCC)    Failure to  thrive (0-17)    Fracture of left lower leg    Gout    Hyperlipidemia    Hypertension    Morbid obesity (HCC)    Unstable gait    Significant Hospital Events: Including procedures, antibiotic start and stop dates in addition to other pertinent events   2/5 - Presented to [facility] ED with poor responsiveness, aphasia, R-sided deficits. CT Head with L MCA territory infarct. CTA Head/Neck with occlusion of L MCA M2 segment and severe near-occlusive stenosis of L ICA. Taken to [facility] for MT, stent and angioplasty ([name]). PCCM consulted for post-procedure management. 2/6 transfer out of ICU 2/15 aspiration event early a.m. resulting in respiratory distress, transferred back to ICU and emergently intubated with development of severe shock requiring epi, levo, vaso. Transfused PRBC x1 for possible GI bleed 2/16: 3 pressors, weaning. Coox without cardiogenic component. Antibiotics vanc/cefepime. CT CAP with SBO  2/18: weaned off to only levo now which is likely sedation related  2/20: UOP drop overnight with ongoing poor renal function. Weaning sedation to evaluate neuro status.   Interim History / Subjective:  Overnight fever up to 102. He did have a few runs of NSVT and given K repletion. Has worsening renal function with drop off in UOP overnight. BNP is elevated as well. Giving 100mg  Lasix this AM, starting free water for his Na Cl dysfunction, DVT ppx, stopping steroids as shock has resolved, increasing insulin.   Objective:  Blood pressure (!) 145/66, pulse 92, temperature 98.6 F (37 C), temperature source Axillary, resp. rate (!) 0, height 5\' 10"  (1.778 m), weight 133 kg, SpO2 100%.  Vent Mode: PSV;CPAP FiO2 (%):  [35 %] 35 % Set Rate:  [22 bmp] 22 bmp Vt Set:  [570 mL] 570 mL PEEP:  [5 cmH20] 5 cmH20 Pressure Support:  [5 cmH20] 5 cmH20   Intake/Output Summary (Last 24 hours) at 06/20/2023 1100 Last data filed at 06/20/2023 1028 Gross per 24 hour  Intake 1251.38 ml  Output  685 ml  Net 566.38 ml   Filed Weights   06/18/23 0500 06/19/23 0500 06/20/23 0500  Weight: 132.2 kg 133.4 kg 133 kg   Physical Exam: General: acute on chronically ill appearing older male, laying in bed, no acute dsitress  HENT: pupils pinpoint, sluggish. Mmm. ETT, OGT.  Pulmonary: vented, minimal settings. Decreased BLL sounds, no wheezing, mild increase WOB  Cardiovascular: s1s2, no murmur, rub, gallop  GI: rounded, soft, non-distended, +BS Extremities: no pitting edema  Neuro: sedated, limited exam. PERRLA, pinpoint bilaterally. +gag/cough. Does not follow commands. Does not withdrawal or extend to painful stimulus GU: foley  Resolved Hospital Problem List:  Shock   Assessment & Plan:  Acute hypoxemic respiratory failure Klebsiella pneumonia 2/2 aspiration event Aspiration even 2/15 early AM with respiratory distress requiring emergent intubation. BAL + K. Pneumonia - narrowed cefepime to rocephin to finish course 2/22 - okay on SBT but mentation precludes extubation - continue mechanical ventilation  - target TVol 6-8cc/kgIBW - target Plateau Pressure < 30cm H20 - target driving pressure less than 15 cm of water - target PaO2 55-65: titrate PEEP/FiO2 per protocol - VAP and PAD protocol in place  - aggressive pulmonary hygiene   Coffee-ground emesis with concern for acute GI bleed, resolved Presumed stress ulcer bleed. Required 1 U PRBC 2/15 for resuscitation.  2/18 with OG tube that was clogged, now replaced without blood output. TF and Plavix restarted 2/18. - con't TF  - daily CBC  - back on aspirin, plavix - con't protonix BID - con't tube feeds   Ileus Seen on CT CAP 2/16. Last BM 2/16. Abdomen is rounded, soft, non-distended. Hypoactive sounds. Last BM 2/16. Previous notes state family would want surgical intervention if needed. Not indicated at this time. - dulcolax suppository scheduled - reglan 5mg  q6h  - increase miralax to BID and PRN - senna scheduled -  con't colace BID  Sepsis secondary to aspiration pneumonia - pressors stopped 2/19 - steroid stop 2/20  - con't rocephin until 2/22  Left MCA M2 occlusion and left ICA near occlusion now status post thrombectomy with complete revascularization Likely secondary to large vessel obstruction History of cardiomyopathy with low ejection fraction - stroke primary, appreciate management  - seizure precautions - neuroprotective measures- normothermia, euglycemia, HOB greater than 30, head in neutral alignment, normocapnia, normoxia - con't lipitor 80mg  daily, zetia 10mg  daily  -PT/OT/SLP when appropriate  Heart failure with reduced ejection fraction ejection fraction of 30% with global hypokinesis, normal right ventricular function Hypertension Hyperlipidemia Coox 70s with no evidence of cardiogenic shock given recent deterioration after aspiration event.  - BNP up 2/19 overnight, Lasix 100mg  IV 2/20 for volume removal - con't tele monitoring  - statin/zetia as above   Type 2 diabetes - cbg q4h  - ssi  - increase semglee 4>8UBID  Acute kidney injury superimposed on chronic kidney disease stage IIIb Worsening sCr with drop off in UOP 2/19>2/20.  - Lasix 100mg  IV once  - trend bmp, mag phos - watch K  - no nephro consult yet, will see how he responds to Lasix first  - replete elytes -  strict I&O - Avoid nephrotoxic agents, renally dose medications - ensure adequate renal perfusion   Hypernatremia  Hyperchloremia  - free water 300 q4h  - trend  Best Practice (right click and "Reselect all SmartList Selections" daily)   Diet/type: tubefeeds and NPO DVT prophylaxis SCD Pressure ulcer(s): N/A GI prophylaxis: PPI Lines: Central line and yes and it is still needed Foley:  Yes, and it is still needed Code Status:  full code Last date of multidisciplinary goals of care discussion: Continue to update patient and family daily. They are wanting full care at this time.   CC time:  Cristopher Peru, PA-C [facility] 06/20/23 11:00 AM  Please see [email or web address] for pager details.  From 7A-7P if no response, please call [phone] After hours, please call ELink [phone]

## 2023-06-20 NOTE — Progress Notes (Signed)
 OT Cancellation Note/Discharge  Patient Details Name: Nathan Proctor. MRN: [id number] DOB: 1940-07-11   Cancelled Treatment:    Reason Eval/Treat Not Completed: Other (comment);Patient not medically ready (OT signing off at this time. Please reorder when appropriate. thanks)  [facility] 06/20/2023, 7:28 AM Luisa Dago, OT/L   Acute OT Clinical Specialist Acute Rehabilitation Services Pager [phone] Office [phone]

## 2023-06-20 NOTE — Inpatient Diabetes Management (Addendum)
 Inpatient Diabetes Program Recommendations  AACE/ADA: New Consensus Statement on Inpatient Glycemic Control (2015)  Target Ranges:  Prepandial:   less than 140 mg/dL      Peak postprandial:   less than 180 mg/dL (1-2 hours)      Critically ill patients:  140 - 180 mg/dL   Lab Results  Component Value Date   GLUCAP 145 (H) 06/20/2023   HGBA1C 6.1 (H) 06/05/2023    Review of Glycemic Control  Latest Reference Range & Units 06/19/23 07:51 06/19/23 11:24 06/19/23 15:33 06/19/23 19:18 06/19/23 23:15 06/20/23 03:19 06/20/23 07:43  Glucose-Capillary 70 - 99 mg/dL 147 (H) 829 (H) 562 (H) 211 (H) 219 (H) 195 (H) 145 (H)   Diabetes history: DM 2 Outpatient Diabetes medications: 70/30 and jardiance in the past listed as not taking Current orders for Inpatient glycemic control:  Semglee 8 units bid started today Novolog 0-20 units Q4hours  Osmolite 20 ml/hour Solucortef 50 mg daily  Inpatient Diabetes Program Recommendations:    Note: Fasting glucose 145 this am, may need to reduce or d/c basal insulin. Renal function is also elevated and is increased this am.  -   Start Novolog 3 units Q4 hours Tube Feed Coverage (so pt does not have hypoglycemia if tube feeds are stopped)  Thanks,  Christena Deem RN, MSN, BC-ADM Inpatient Diabetes Coordinator Team Pager [phone] (8a-5p)

## 2023-06-21 DIAGNOSIS — I639 Cerebral infarction, unspecified: Secondary | ICD-10-CM | POA: Diagnosis not present

## 2023-06-21 DIAGNOSIS — K567 Ileus, unspecified: Secondary | ICD-10-CM | POA: Diagnosis not present

## 2023-06-21 DIAGNOSIS — J150 Pneumonia due to Klebsiella pneumoniae: Secondary | ICD-10-CM | POA: Diagnosis not present

## 2023-06-21 DIAGNOSIS — J9601 Acute respiratory failure with hypoxia: Secondary | ICD-10-CM | POA: Diagnosis not present

## 2023-06-21 LAB — GLUCOSE, CAPILLARY
Glucose-Capillary: 230 mg/dL — ABNORMAL HIGH (ref 70–99)
Glucose-Capillary: 228 mg/dL — ABNORMAL HIGH (ref 70–99)
Glucose-Capillary: 233 mg/dL — ABNORMAL HIGH (ref 70–99)
Glucose-Capillary: 261 mg/dL — ABNORMAL HIGH (ref 70–99)
Glucose-Capillary: 239 mg/dL — ABNORMAL HIGH (ref 70–99)
Glucose-Capillary: 300 mg/dL — ABNORMAL HIGH (ref 70–99)

## 2023-06-21 LAB — BASIC METABOLIC PANEL
Anion gap: 14 (ref 5–15)
BUN: 131 mg/dL — ABNORMAL HIGH (ref 8–23)
CO2: 20 mmol/L — ABNORMAL LOW (ref 22–32)
Calcium: 9 mg/dL (ref 8.9–10.3)
Chloride: 114 mmol/L — ABNORMAL HIGH (ref 98–111)
Creatinine, Ser: 5.66 mg/dL — ABNORMAL HIGH (ref 0.61–1.24)
GFR, Estimated: 9 mL/min — ABNORMAL LOW (ref >60)
Glucose, Bld: 227 mg/dL — ABNORMAL HIGH (ref 70–99)
Potassium: 3.9 mmol/L (ref 3.5–5.1)
Sodium: 148 mmol/L — ABNORMAL HIGH (ref 135–145)

## 2023-06-21 LAB — COOXEMETRY PANEL
Carboxyhemoglobin: 1.9 % — ABNORMAL HIGH (ref 0.5–1.5)
Methemoglobin: 1.1 % (ref 0.0–1.5)
O2 Saturation: 70.2 %
Total hemoglobin: 8.1 g/dL — ABNORMAL LOW (ref 12.0–16.0)

## 2023-06-21 LAB — CBC
HCT: 25.3 % — ABNORMAL LOW (ref 39.0–52.0)
Hemoglobin: 7.6 g/dL — ABNORMAL LOW (ref 13.0–17.0)
MCH: 31 pg (ref 26.0–34.0)
MCHC: 30 g/dL (ref 30.0–36.0)
MCV: 103.3 fL — ABNORMAL HIGH (ref 80.0–100.0)
Platelets: 110 10*3/uL — ABNORMAL LOW (ref 150–400)
RBC: 2.45 MIL/uL — ABNORMAL LOW (ref 4.22–5.81)
RDW: 14 % (ref 11.5–15.5)
WBC: 7.3 10*3/uL (ref 4.0–10.5)
nRBC: 0 % (ref 0.0–0.2)

## 2023-06-21 LAB — MAGNESIUM: Magnesium: 2.4 mg/dL (ref 1.7–2.4)

## 2023-06-21 LAB — PHOSPHORUS: Phosphorus: 5.4 mg/dL — ABNORMAL HIGH (ref 2.5–4.6)

## 2023-06-21 MED ORDER — NOREPINEPHRINE 4 MG/250ML-% IV SOLN
0.0000 ug/min | INTRAVENOUS | Status: DC
  Administered 2023-06-21 – 2023-06-23 (×2): 2 ug/min via INTRAVENOUS
  Administered 2023-06-23: 15 ug/min via INTRAVENOUS
  Administered 2023-06-23: 10 ug/min via INTRAVENOUS
  Administered 2023-06-24: 12 ug/min via INTRAVENOUS
  Administered 2023-06-24: 5 ug/min via INTRAVENOUS
  Administered 2023-06-24: 11 ug/min via INTRAVENOUS
  Administered 2023-06-25: 1 ug/min via INTRAVENOUS
  Administered 2023-06-25: 8 ug/min via INTRAVENOUS
  Administered 2023-06-25: 19 ug/min via INTRAVENOUS
  Filled 2023-06-21 (×10): qty 250

## 2023-06-21 MED ORDER — INSULIN GLARGINE-YFGN 100 UNIT/ML ~~LOC~~ SOLN
12.0000 [IU] | Freq: Two times a day (BID) | SUBCUTANEOUS | Status: DC
  Administered 2023-06-21 – 2023-06-22 (×4): 12 [IU] via SUBCUTANEOUS
  Filled 2023-06-21 (×7): qty 0.12

## 2023-06-21 MED ORDER — FUROSEMIDE 10 MG/ML IJ SOLN
120.0000 mg | Freq: Once | INTRAVENOUS | Status: AC
  Administered 2023-06-21: 120 mg via INTRAVENOUS
  Filled 2023-06-21: qty 10

## 2023-06-21 MED ORDER — PROSOURCE TF20 ENFIT COMPATIBL EN LIQD
60.0000 mL | Freq: Two times a day (BID) | ENTERAL | Status: DC
  Administered 2023-06-21 – 2023-07-05 (×28): 60 mL
  Filled 2023-06-21 (×28): qty 60

## 2023-06-21 MED ORDER — OSMOLITE 1.5 CAL PO LIQD
1000.0000 mL | ORAL | Status: DC
  Administered 2023-06-22: 1000 mL

## 2023-06-21 MED ORDER — FENTANYL CITRATE PF 50 MCG/ML IJ SOSY
25.0000 ug | PREFILLED_SYRINGE | INTRAMUSCULAR | Status: DC | PRN
  Administered 2023-06-22 – 2023-06-23 (×3): 25 ug via INTRAVENOUS
  Filled 2023-06-21 (×3): qty 1

## 2023-06-21 NOTE — Progress Notes (Addendum)
 STROKE TEAM PROGRESS NOTE   SUBJECTIVE (INTERVAL HISTORY) Wife arrived to the bedside while rounding.  Wife was updated by Dr. Roda Shutters and  Dr. Denese Killings Patient remains intubated, not on any sedation, and is currently weaning. Neurologically his exam is unchanged His creatinine has increased to 5.66 and renal has been consulted  OBJECTIVE Temp:  [99.1 F (37.3 C)-99.8 F (37.7 C)] 99.3 F (37.4 C) (02/21 1152) Pulse Rate:  [57-112] 101 (02/21 0700) Cardiac Rhythm: Sinus tachycardia;Normal sinus rhythm (02/20 2000) Resp:  [0-45] 31 (02/21 1059) BP: (70-156)/(43-84) 131/62 (02/21 0700) SpO2:  [70 %-100 %] 98 % (02/21 0700) FiO2 (%):  [35 %] 35 % (02/21 1059)  Recent Labs  Lab 06/20/23 1924 06/20/23 2316 06/21/23 0335 06/21/23 0750 06/21/23 1130  GLUCAP 255* 265* 230* 228* 233*   Recent Labs  Lab 06/17/23 0423 06/18/23 0124 06/19/23 0031 06/19/23 2121 06/20/23 0447 06/20/23 0823 06/21/23 0602  NA 143 141 144 146* 149*  --  148*  K 4.6 4.2 3.6 3.8 3.8  --  3.9  CL 109 107 110 112* 114*  --  114*  CO2 20* 22 23 20* 21*  --  20*  GLUCOSE 150* 192* 172* 257* 186*  --  227*  BUN 67* 69* 72* 94* 102*  --  131*  CREATININE 4.07* 3.85* 3.31* 3.66* 4.19*  --  5.66*  CALCIUM 8.6* 8.3* 8.8* 8.6* 8.9  --  9.0  MG 2.1  --  2.2 2.2 2.4 2.3 2.4  PHOS 4.8*  --   --   --   --  4.6 5.4*   Recent Labs  Lab 06/16/23 0414 06/17/23 0423  AST 44* 35  ALT 30 29  ALKPHOS 45 46  BILITOT 1.1 1.1  PROT 5.5* 5.4*  ALBUMIN 2.5* 2.2*    Recent Labs  Lab 06/18/23 0758 06/19/23 0031 06/19/23 1544 06/20/23 0447 06/21/23 0602  WBC 19.5* 7.4 4.9 5.7 7.3  HGB 7.6* 7.7* 8.3* 8.6* 7.6*  HCT 24.6* 25.2* 27.1* 28.0* 25.3*  MCV 99.2 100.8* 100.0 100.7* 103.3*  PLT 157 118* 115* 113* 110*       Component Value Date/Time   CHOL 183 06/06/2023 0557   TRIG 109 06/19/2023 0031   HDL 60 06/06/2023 0557   CHOLHDL 3.1 06/06/2023 0557   VLDL 16 06/06/2023 0557   LDLCALC 107 (H) 06/06/2023 0557    LDLCALC 223 (H) 05/31/2021 0000   Lab Results  Component Value Date   HGBA1C 6.1 (H) 06/05/2023      Component Value Date/Time   LABOPIA NONE DETECTED 06/05/2023 1653   COCAINSCRNUR NONE DETECTED 06/05/2023 1653   LABBENZ NONE DETECTED 06/05/2023 1653   AMPHETMU NONE DETECTED 06/05/2023 1653   THCU NONE DETECTED 06/05/2023 1653   LABBARB NONE DETECTED 06/05/2023 1653    No results for input(s): "ETH" in the last 168 hours.   I have personally reviewed the radiological images below and agree with the radiology interpretations.  DG CHEST PORT 1 VIEW Result Date: 06/19/2023 CLINICAL DATA:  Respiratory distress. EXAM: PORTABLE CHEST 1 VIEW COMPARISON:  Radiograph 06/15/2023, CT 06/16/2023 FINDINGS: Tip of the endotracheal tube 4.5 cm from the carina. Enteric tube and left central line unchanged in positioning. Slight improved right lung aeration with persistent basilar opacity. Slight worsening patchy opacity at the left lung base. No pneumothorax or large pleural effusion. No pulmonary edema. Stable heart size and mediastinal contours. IMPRESSION: 1. Bibasilar opacities, improving on the right but worsening on the left. 2. Stable support  apparatus. Electronically Signed   By: Narda Rutherford M.D.   On: 06/19/2023 16:02   DG Abd Portable 1V Result Date: 06/18/2023 CLINICAL DATA:  Orogastric tube placement. EXAM: PORTABLE ABDOMEN - 1 VIEW COMPARISON:  Radiographs 06/05/2023.  Abdominal CT 06/16/2023. FINDINGS: 1236 hours. Tip of the enteric tube projects over the right upper quadrant of the abdomen, likely in the distal stomach. There is a small amount contrast material within the stomach. The visualized bowel gas pattern is normal. Patient is rotated to the right with persistent right basilar airspace disease and a small right pleural effusion. IMPRESSION: Enteric tube tip projects over the distal stomach. Electronically Signed   By: Carey Bullocks M.D.   On: 06/18/2023 14:48   CT CHEST  ABDOMEN PELVIS WO CONTRAST Result Date: 06/16/2023 CLINICAL DATA:  Septicemia.  Pneumonia. EXAM: CT CHEST, ABDOMEN AND PELVIS WITHOUT CONTRAST TECHNIQUE: Multidetector CT imaging of the chest, abdomen and pelvis was performed following the standard protocol without IV contrast. RADIATION DOSE REDUCTION: This exam was performed according to the departmental dose-optimization program which includes automated exposure control, adjustment of the mA and/or kV according to patient size and/or use of iterative reconstruction technique. COMPARISON:  CT angio chest from 04/04/2016. FINDINGS: CT CHEST FINDINGS Cardiovascular: Heart size is normal. Aortic atherosclerosis and multi vessel coronary artery calcifications. No pericardial effusion. Mediastinum/Nodes: Thyroid gland, trachea and esophagus appear normal. ET tube is in place with tip above the carina. There is a enteric tube with tip in the stomach.No enlarged mediastinal lymph nodes. Hilar lymph nodes are suboptimally evaluated due to lack of IV contrast. Lungs/Pleura: No pleural effusion or interstitial edema. Bilateral lower lobe airspace consolidation is identified, right greater than left. Imaging findings compatible with multifocal pneumonia. Chronic subsegmental atelectasis this is within the right middle lobe and right lower lobe with asymmetric volume loss and elevation of the right hemidiaphragm. Musculoskeletal: No chest wall mass or suspicious bone lesions identified. CT ABDOMEN PELVIS FINDINGS Hepatobiliary: No focal liver abnormality. Gallbladder appears normal. No signs of bile duct dilatation. Pancreas: Unremarkable. No pancreatic ductal dilatation or surrounding inflammatory changes. Spleen: Normal in size without focal abnormality. Adrenals/Urinary Tract: Normal adrenal glands. Multiple stones identified within the upper pole of the left kidney which measure up to 7 mm. Bilateral exophytic kidney lesions of varying complexity are identified. These  are suboptimally assessed on the current exam reflecting lack of IV contrast material. No signs of hydronephrosis. Urinary bladder is decompressed around a Foley catheter. Stomach/Bowel: Enteric tube tip is in the proximal stomach. There is retained enteric contrast material within the gastric fundus. The appendix is visualized and appears normal. There are several dilated loops of small bowel within the right hemiabdomen. The proximal small bowel loops are normal in caliber. Within the right upper quadrant of the abdomen there are multiple dilated loops of small bowel measuring up to 3.5 cm containing air-fluid levels. The bowel loops proximal and distal are normal in caliber. Within the limitations of unenhanced technique there is no significant bowel wall thickening. Distal colonic diverticulosis identified without signs of acute diverticulitis. Retained enteric contrast material identified within the colon up to the rectum Vascular/Lymphatic: Aortic atherosclerosis without aneurysm. No signs of abdominopelvic adenopathy. Reproductive: Prostate gland is either atrophic or surgically absent. Penile prosthesis identified. Other: No free fluid or fluid collections. Musculoskeletal: No acute or significant osseous findings. IMPRESSION: 1. Bilateral lower lobe airspace consolidation, right greater than left. Imaging findings compatible with multifocal pneumonia. 2. Chronic subsegmental atelectasis within the right  middle lobe and right lower lobe with asymmetric volume loss and elevation of the right hemidiaphragm. 3. Assessment of bowel pathology is limited due to lack of IV and enteric contrast material. There are multiple abnormally dilated loops of small bowel within the right hemiabdomen. Findings are compatible with a small-bowel obstruction. As there is normal caliber proximal small bowel and normal caliber distal small bowel findings are concerning for either close loop obstruction or internal hernia. 4.  Distal colonic diverticulosis without signs of acute diverticulitis. 5. Nonobstructing left renal calculi. 6. Bilateral exophytic kidney lesions of varying complexity are identified. These are suboptimally assessed on the current exam reflecting lack of IV contrast material. Further evaluation with nonemergent renal ultrasound is advised. 7.  Aortic Atherosclerosis (ICD10-I70.0). Electronically Signed   By: Signa Kell M.D.   On: 06/16/2023 15:51   ECHOCARDIOGRAM LIMITED Result Date: 06/15/2023    ECHOCARDIOGRAM LIMITED REPORT   Patient Name:   Nathan Proctor. Date of Exam: 06/15/2023 Medical Rec #:  [id number]              Height:       70.0 in Accession #:    [id number]             Weight:       263.9 lb Date of Birth:  [birth year]              BSA:          2.348 m Patient Age:    83 years               BP:           121/53 mmHg Patient Gender: M                      HR:           90 bpm. Exam Location:  Inpatient Procedure: Limited Echo, Color Doppler and Intracardiac Opacification Agent            (Both Spectral and Color Flow Doppler were utilized during            procedure). Indications:    Stroke i63.9, Shock  History:        Patient has prior history of Echocardiogram examinations, most                 recent 06/05/2023. CHF; Risk Factors:Hypertension and                 Dyslipidemia.  Sonographer:    Irving Burton Senior RDCS Referring Phys: [phone] [facility] ELLISON  Sonographer Comments: Technically difficult due to body habitus, scanned supine on artificial repirator IMPRESSIONS  1. Very difficult to determine LVEF to limited visualization and frequent ectopy. . Left ventricular ejection fraction, by estimation, is 40 to 45%. The left ventricle has mildly decreased function. Left ventricular endocardial border not optimally defined to evaluate regional wall motion.  2. Right ventricular systolic function was not well visualized. The right ventricular size is not well visualized.  3. The inferior vena  cava is normal in size with <50% respiratory variability, suggesting right atrial pressure of 8 mmHg.  4. Limited echo FINDINGS  Left Ventricle: Very difficult to determine LVEF to limited visualization and frequent ectopy. Left ventricular ejection fraction, by estimation, is 40 to 45%. The left ventricle has mildly decreased function. Left ventricular endocardial border not optimally defined to evaluate regional wall motion. Definity contrast agent was given IV to  delineate the left ventricular endocardial borders. Right Ventricle: The right ventricular size is not well visualized. Right vetricular wall thickness was not well visualized. Right ventricular systolic function was not well visualized. Venous: The inferior vena cava is normal in size with less than 50% respiratory variability, suggesting right atrial pressure of 8 mmHg. Additional Comments: Color Doppler performed.  Dina Rich MD Electronically signed by Dina Rich MD Signature Date/Time: 06/15/2023/4:31:38 PM    Final    DG Abd 1 View Result Date: 06/15/2023 CLINICAL DATA:  Orogastric tube placement. EXAM: ABDOMEN - 1 VIEW COMPARISON:  Radiograph 06/08/2023 FINDINGS: Tip of the enteric tube below the diaphragm in the stomach, the side port is just beyond the gastroesophageal junction. There is gaseous gastric distension. Dilated bowel in the right abdomen up 4.7 cm, potentially small bowel, but incompletely included in the field of view. IMPRESSION: 1. Tip of the enteric tube below the diaphragm in the stomach, side-port just beyond the gastroesophageal junction. 2. Gaseous gastric distension. Additional dilated bowel in the right abdomen likely small bowel, although incompletely included in the field of view. Electronically Signed   By: Narda Rutherford M.D.   On: 06/15/2023 15:06   DG CHEST PORT 1 VIEW Result Date: 06/15/2023 CLINICAL DATA:  10031 Cough 10031 EXAM: PORTABLE CHEST 1 VIEW COMPARISON:  June 08, 2023 FINDINGS: The  cardiomediastinal silhouette is unchanged in contour.Unchanged elevation of the RIGHT hemidiaphragm. No pleural effusion. No pneumothorax. Bibasilar platelike opacities. Atherosclerotic calcifications. Similar background of interstitial prominence. IMPRESSION: Bibasilar platelike opacities, favored to reflect atelectasis. Electronically Signed   By: Meda Klinefelter M.D.   On: 06/15/2023 12:07   DG CHEST PORT 1 VIEW Result Date: 06/15/2023 CLINICAL DATA:  Respiratory failure, intubation and central line placement. EXAM: PORTABLE CHEST 1 VIEW COMPARISON:  Film earlier today at 0734 hours FINDINGS: Interval intubation with the endotracheal tube tip approximately 3.5 cm above the carina. Left jugular central line placement with the line tip in the upper SVC. No pneumothorax. Volume loss of the right lung remains with elevation of the right hemidiaphragm. Improved expansion of the left lung. Potential underlying mild pulmonary interstitial edema. No significant pleural effusions. IMPRESSION: 1. Interval intubation and left jugular central line placement. No pneumothorax. 2. Improved expansion of the left lung. Persistent volume loss of the right lung with elevation of the right hemidiaphragm. Potential underlying mild pulmonary interstitial edema. Electronically Signed   By: Irish Lack M.D.   On: 06/15/2023 10:42   DG Swallowing Func-Speech Pathology Result Date: 06/12/2023 Table formatting from the original result was not included. Modified Barium Swallow Study Patient Details Name: Nathan Proctor. MRN: [id number] Date of Birth: 1941-04-18 Today's Date: 06/12/2023 HPI/PMH: HPI: Pt is a 83 y.o. male who was admitted as a code stroke on 02/05 due to acute onset of right-sided weakness, right facial droop and aphasia. MRI from 02/06 displayed an evolving acute left MCA infarct, most pronounced at the left insula and left frontal operculum. Pt has intentionally lost 300 pounds in the recent past, with  pre-loss weight at ~600Ib. PMHx includes hypertension, diabetes, CKD, prior CVA. Clinical Impression: Clinical Impression: Pt presents with a mild-moderate oropharyngeal dysphagia (DIGEST Score: 1) primarily characterized by silent penetration of thin liquid at level of VFs, flash penetration of nectar-thick above VFs, and diffuse pharyngeal residue. Pt's dysphagia severity level dropped from DIGEST score of 3 to 1, compared to previous MBS eval on 02/07.     Pharyngeal residue was present throughout all consistencies. Pt's  posture for swallowing was consistently neutral. Pt follows commands for compensatory strategies more readily now with max multimodal cueing. Effective compensatory strategies include multiple swallows, throat clearing, and a straw when used with a verbally cued slow rate. Multiple consecutive sips of thin-liquid presented the most risk for aspiration due to silent penetration of Vfs. Unlike previous MBS study, pt tolerated solid consistency, displaying prolonged mastication but majority clearance of bolus after 2-3 swallows.     Recommendations include upgrading pt's diet to dysphagia 2 (finely chopped) and thin liquids to optimize safety and efficiency for swallowing. SLP f/u to monitor tolerance of thin-liquids with compensatory strategies is highly recommended. Pt needs to remain upright for 30 minutes after PO intake due to remaining pharyngeal residue to optimize airway safety. Factors that may increase risk of adverse event in presence of aspiration [name] & Clearance [name] 2021): Factors that may increase risk of adverse event in presence of aspiration [name] & Clearance [name] 2021): Dependence for feeding and/or oral hygiene Recommendations/Plan: Swallowing Evaluation Recommendations Swallowing Evaluation Recommendations Recommendations: PO diet PO Diet Recommendation: Dysphagia 2 (Finely chopped); Thin liquids (Level 0) Liquid Administration via: Straw Medication Administration: Crushed with puree  Supervision: Full supervision/cueing for swallowing strategies; Full assist for feeding Swallowing strategies  : Slow rate; Small bites/sips; Multiple dry swallows after each bite/sip; Clear throat intermittently Postural changes: Position pt fully upright for meals; Stay upright 30-60 min after meals Oral care recommendations: Oral care BID (2x/day); Staff/trained caregiver to provide oral care Treatment Plan Treatment Plan Follow-up recommendations: Follow physicians's recommendations for discharge plan and follow up therapies Functional status assessment: Patient has had a recent decline in their functional status and demonstrates the ability to make significant improvements in function in a reasonable and predictable amount of time. Recommendations Recommendations for follow up therapy are one component of a multi-disciplinary discharge planning process, led by the attending physician.  Recommendations may be updated based on patient status, additional functional criteria and insurance authorization. Assessment: Orofacial Exam: Orofacial Exam Oral Cavity: Oral Hygiene: WFL Oral Cavity - Dentition: Adequate natural dentition Orofacial Anatomy: WFL Anatomy: Anatomy: WFL Boluses Administered: Boluses Administered Boluses Administered: Thin liquids (Level 0); Mildly thick liquids (Level 2, nectar thick); Moderately thick liquids (Level 3, honey thick); Puree; Solid  Oral Impairment Domain: Oral Impairment Domain Lip Closure: Interlabial escape, no progression to anterior lip Tongue control during bolus hold: Cohesive bolus between tongue to palatal seal Bolus preparation/mastication: Slow prolonged chewing/mashing with complete recollection Bolus transport/lingual motion: Brisk tongue motion Oral residue: Residue collection on oral structures Location of oral residue : Tongue Initiation of pharyngeal swallow : Pyriform sinuses  Pharyngeal Impairment Domain: Pharyngeal Impairment Domain Soft palate elevation: No  bolus between soft palate (SP)/pharyngeal wall (PW) Laryngeal elevation: Partial superior movement of thyroid cartilage/partial approximation of arytenoids to epiglottic petiole Anterior hyoid excursion: Complete anterior movement Epiglottic movement: Complete inversion Laryngeal vestibule closure: Incomplete, narrow column air/contrast in laryngeal vestibule Pharyngeal stripping wave : Present - complete Pharyngeal contraction (A/P view only): N/A Pharyngoesophageal segment opening: Complete distension and complete duration, no obstruction of flow Tongue base retraction: Trace column of contrast or air between tongue base and PPW Pharyngeal residue: Collection of residue within or on pharyngeal structures Location of pharyngeal residue: Diffuse (>3 areas)  Esophageal Impairment Domain: Esophageal Impairment Domain Esophageal clearance upright position: -- (Not tested) Pill: No data recorded Penetration/Aspiration Scale Score: Penetration/Aspiration Scale Score 1.  Material does not enter airway: Solid; Puree; Moderately thick liquids (Level 3, honey thick) 2.  Material enters  airway, remains ABOVE vocal cords then ejected out: Mildly thick liquids (Level 2, nectar thick) 5.  Material enters airway, CONTACTS cords and not ejected out: Thin liquids (Level 0) Compensatory Strategies: Compensatory Strategies Compensatory strategies: Yes Straw: Effective Effective Straw: Mildly thick liquid (Level 2, nectar thick) (Effective with slow rate applied.) Multiple swallows: Effective Effective Multiple Swallows: Thin liquid (Level 0); Mildly thick liquid (Level 2, nectar thick); Moderately thick liquid (Level 3, honey thick); Puree; Solid   General Information: Caregiver present: No  Diet Prior to this Study: Dysphagia 1 (pureed); Moderately thick liquids (Level 3, honey thick)   Temperature : Normal   Respiratory Status: WFL   Supplemental O2: None (Room air)   History of Recent Intubation: Yes  Behavior/Cognition: Alert;  Cooperative; Pleasant mood; Requires cueing Self-Feeding Abilities: Needs assist with self-feeding Baseline vocal quality/speech: Normal Volitional Cough: Unable to elicit Volitional Swallow: Able to elicit Exam Limitations: No limitations Goal Planning: Prognosis for improved oropharyngeal function: Good Barriers to Reach Goals: Language deficits No data recorded No data recorded No data recorded Pain: Pain Assessment Pain Assessment: No/denies pain Pain Score: 0 End of Session: Start Time:SLP Start Time (ACUTE ONLY): 1310 Stop Time: SLP Stop Time (ACUTE ONLY): 1335 Time Calculation:SLP Time Calculation (min) (ACUTE ONLY): 25 min Charges: SLP Evaluations $ SLP Speech Visit: 1 Visit SLP Evaluations $MBS Swallow: 1 Procedure $Swallowing Treatment: 1 Procedure SLP visit diagnosis: SLP Visit Diagnosis: Dysphagia, oropharyngeal phase (R13.12) Past Medical History: Past Medical History: Diagnosis Date  Anemia of chronic disease   Ankle fracture 02/15/2016  Cancer (HCC)   Prostate  Chronic constipation   Chronic kidney disease   stage III  Diabetes mellitus without complication (HCC)   type II   Diabetic peripheral neuropathy (HCC)   Failure to thrive (0-17)   Fracture of left lower leg   Gout   Hyperlipidemia   Hypertension   Morbid obesity (HCC)   Unstable gait  Past Surgical History: Past Surgical History: Procedure Laterality Date  IR CT HEAD LTD  06/05/2023  IR INTRAVSC STENT CERV CAROTID W/O EMB-PROT MOD SED INC ANGIO  06/05/2023  IR PERCUTANEOUS ART THROMBECTOMY/INFUSION INTRACRANIAL INC DIAG ANGIO  06/05/2023  IR US GUIDE VASC ACCESS RIGHT  06/05/2023  ORIF ANKLE FRACTURE Left 02/29/2016  Procedure: OPEN REDUCTION INTERNAL FIXATION (ORIF) ANKLE FRACTURE;  Surgeon: Yolonda Kida, MD;  Location: [facility];  Service: Orthopedics;  Laterality: Left;  PROSTATE SURGERY    RADIOLOGY WITH ANESTHESIA N/A 06/05/2023  Procedure: IR WITH ANESTHESIA;  Surgeon: Radiologist, Medication, MD;  Location: [facility];  Service: Radiology;   Laterality: N/A; Claudine Mouton 06/12/2023, 3:16 PM  DG Abd 1 View Result Date: 06/09/2023 CLINICAL DATA:  [id number]. Encounter for feeding tube placement. EXAM: ABDOMEN - 1 VIEW COMPARISON:  Abdomen film 06/05/2023. FINDINGS: Dobbhoff feeding tube is well placed with the radiopaque tip in the distal stomach. The visualized bowel pattern is nonobstructive. There is barium newly seen in the flexures and transverse colon. There is no supine evidence of free air. There is atelectasis in the lung bases. Cardiomegaly. IMPRESSION: 1. Dobbhoff feeding tube well placed with the radiopaque tip in the distal stomach. 2. Nonobstructive bowel gas pattern. 3. Cardiomegaly. Electronically Signed   By: Almira Bar M.D.   On: 06/09/2023 03:41   DG CHEST PORT 1 VIEW Result Date: 06/08/2023 CLINICAL DATA:  CHF EXAM: PORTABLE CHEST 1 VIEW COMPARISON:  01/06/2023 FINDINGS: Feeding tube in place. There is cardiomegaly with vascular congestion. Chronic elevation of the right  hemidiaphragm with right base atelectasis. Interstitial prominence throughout the lungs, right greater than left could reflect interstitial edema. Possible small effusions. No acute bony abnormality. IMPRESSION: Cardiomegaly with vascular congestion and probable mild interstitial edema. Question small bilateral effusions. Chronic elevation of the right hemidiaphragm with right base atelectasis. Electronically Signed   By: Charlett Nose M.D.   On: 06/08/2023 18:20   DG Swallowing Func-Speech Pathology Result Date: 06/07/2023 Table formatting from the original result was not included. Modified Barium Swallow Study Study completed and documented by [name], SLP Student Supervised and reviewed by Harlon Ditty, MA CCC-SLP [facility] Secure Chat Preferred Office [phone] Patient Details Name: Nathan Proctor. MRN: [id number] Date of Birth: [birth year] Today's Date: 06/07/2023 HPI/PMH: HPI: Pt is a 83 y.o. male who was admitted  as a code stroke on 02/05 due to acute onset of right-sided weakness, right facial droop and aphasia. MRI from 02/06 displayed an evolving acute left MCA infarct, most pronounced at the left insula and left frontal operculum. Pt has intentionally lost 300 pounds in the recent past, with pre-loss weight at ~600Ib. PMHx includes hypertension, diabetes, CKD, prior CVA. Clinical Impression: Clinical Impression: Pt presented with a moderate-severe oropharyngeal dysphagia (DIGEST Score: 3) primarily characterized by silent aspiration of thin liquid, silent penetration of nectar-thick liquid, diffuse pharyngeal residue collection during thin/nectar-thick consistencies, and posterior-escape of bolus across multiple consistencies. Pt positioning was a potential limitation for examination due to consistent posterior head position. More anterior head positioning during nectar-thick liquid displayed less pharyngeal residue, increased hyo-laryngeal excursion, and overall better airway safety. This positioning was not present throughout due to pt's receptive language deficits to understand cues. Alternating between honey-thick and puree consistencies provided more oropharyngeal bolus clearance and no signs of aspiration. Recommend a dysphagia 1 (puree) and honey-thick liquid diet to optimize pt's airway safety and potential for meeting nutritional needs. Slow rate, small bites/sips, natural head posturing, and multiple swallows are compensatory strategies that displayed best efficiency with pt. F/u with SLP for family education, compensatory strategy training, and upgraded diet trials is recommended. Factors that may increase risk of adverse event in presence of aspiration [name] & Clearance [name] 2021): Factors that may increase risk of adverse event in presence of aspiration [name] & Clearance [name] 2021): Weak cough Recommendations/Plan: Swallowing Evaluation Recommendations Swallowing Evaluation Recommendations Recommendations: PO diet PO  Diet Recommendation: Dysphagia 1 (Pureed); Moderately thick liquids (Level 3, honey thick) Liquid Administration via: Spoon Medication Administration: Crushed with puree Supervision: Full supervision/cueing for swallowing strategies; Full assist for feeding Swallowing strategies  : Slow rate; Small bites/sips; Multiple dry swallows after each bite/sip (Pt needs to be in a neutral head position (no posterior head tilt).) Postural changes: Position pt fully upright for meals; Stay upright 30-60 min after meals Oral care recommendations: Oral care BID (2x/day); Staff/trained caregiver to provide oral care Caregiver Recommendations: Have oral suction available Treatment Plan Treatment Plan Treatment recommendations: Therapy as outlined in treatment plan below Follow-up recommendations: Follow physicians's recommendations for discharge plan and follow up therapies Functional status assessment: Patient has had a recent decline in their functional status and demonstrates the ability to make significant improvements in function in a reasonable and predictable amount of time. Treatment frequency: Min 2x/week Treatment duration: 2 weeks Interventions: Aspiration precaution training; Compensatory techniques; Patient/family education; Diet toleration management by SLP; Trials of upgraded texture/liquids Recommendations Recommendations for follow up therapy are one component of a multi-disciplinary discharge planning process, led by the attending physician.  Recommendations may be updated  based on patient status, additional functional criteria and insurance authorization. Assessment: Orofacial Exam: Orofacial Exam Oral Cavity - Dentition: Adequate natural dentition Orofacial Anatomy: WFL Anatomy: Anatomy: WFL Boluses Administered: Boluses Administered Boluses Administered: Thin liquids (Level 0); Mildly thick liquids (Level 2, nectar thick); Moderately thick liquids (Level 3, honey thick); Puree; Solid  Oral Impairment Domain:  Oral Impairment Domain Lip Closure: Escape progressing to mid-chin Tongue control during bolus hold: Posterior escape of greater than half of bolus Bolus preparation/mastication: -- (Pt did not chew solid and SLP had to remove it from mouth.) Bolus transport/lingual motion: Repetitive/disorganized tongue motion Oral residue: Residue collection on oral structures Location of oral residue : Tongue; Palate Initiation of pharyngeal swallow : Pyriform sinuses  Pharyngeal Impairment Domain: Pharyngeal Impairment Domain Soft palate elevation: No bolus between soft palate (SP)/pharyngeal wall (PW) Laryngeal elevation: Partial superior movement of thyroid cartilage/partial approximation of arytenoids to epiglottic petiole Anterior hyoid excursion: Partial anterior movement Epiglottic movement: Partial inversion Laryngeal vestibule closure: Incomplete, narrow column air/contrast in laryngeal vestibule Pharyngeal stripping wave : Present - complete Pharyngeal contraction (A/P view only): N/A Pharyngoesophageal segment opening: Complete distension and complete duration, no obstruction of flow Tongue base retraction: Narrow column of contrast or air between tongue base and PPW Pharyngeal residue: Collection of residue within or on pharyngeal structures Location of pharyngeal residue: Diffuse (>3 areas)  Esophageal Impairment Domain: Esophageal Impairment Domain Esophageal clearance upright position: -- (Not tested.) Pill: No data recorded Penetration/Aspiration Scale Score: Penetration/Aspiration Scale Score 1.  Material does not enter airway: Puree; Moderately thick liquids (Level 3, honey thick) 3.  Material enters airway, remains ABOVE vocal cords and not ejected out: Mildly thick liquids (Level 2, nectar thick) 8.  Material enters airway, passes BELOW cords without attempt by patient to eject out (silent aspiration) : Thin liquids (Level 0) Compensatory Strategies: Compensatory Strategies Compensatory strategies: Yes Straw:  Ineffective Multiple swallows: Effective (Required max cueing due to pt's language.) Effective Multiple Swallows: Puree; Moderately thick liquid (Level 3, honey thick); Mildly thick liquid (Level 2, nectar thick)   General Information: Caregiver present: No  Diet Prior to this Study: NPO   Temperature : Normal   Respiratory Status: WFL   Supplemental O2: None (Room air)   No data recorded Behavior/Cognition: Alert; Cooperative; Pleasant mood; Requires cueing Self-Feeding Abilities: Needs assist with self-feeding Baseline vocal quality/speech: Dysphonic Volitional Cough: Unable to elicit Volitional Swallow: Able to elicit Exam Limitations: Poor positioning Goal Planning: Prognosis for improved oropharyngeal function: Good Barriers to Reach Goals: Language deficits No data recorded No data recorded Consulted and agree with results and recommendations: Pt unable/family or caregiver not available Pain: Pain Assessment Pain Assessment: No/denies pain Faces Pain Scale: 0 Facial Expression: 0 Body Movements: 0 Muscle Tension: 0 Compliance with ventilator (intubated pts.): N/A Vocalization (extubated pts.): 0 CPOT Total: 0 End of Session: Start Time:SLP Start Time (ACUTE ONLY): 0903 Stop Time: SLP Stop Time (ACUTE ONLY): 0930 Time Calculation:SLP Time Calculation (min) (ACUTE ONLY): 27 min Charges: SLP Evaluations $ SLP Speech Visit: 1 Visit SLP Evaluations $BSS Swallow: 1 Procedure $MBS Swallow: 1 Procedure $ SLP EVAL LANGUAGE/SOUND PRODUCTION: 1 Procedure $Swallowing Treatment: 1 Procedure SLP visit diagnosis: SLP Visit Diagnosis: Dysphagia, oropharyngeal phase (R13.12) Past Medical History: Past Medical History: Diagnosis Date  Anemia of chronic disease   Ankle fracture 02/15/2016  Cancer (HCC)   Prostate  Chronic constipation   Chronic kidney disease   stage III  Diabetes mellitus without complication (HCC)   type II   Diabetic  peripheral neuropathy (HCC)   Failure to thrive (0-17)   Fracture of left lower leg   Gout    Hyperlipidemia   Hypertension   Morbid obesity (HCC)   Unstable gait  Past Surgical History: Past Surgical History: Procedure Laterality Date  IR CT HEAD LTD  06/05/2023  IR INTRAVSC STENT CERV CAROTID W/O EMB-PROT MOD SED INC ANGIO  06/05/2023  IR PERCUTANEOUS ART THROMBECTOMY/INFUSION INTRACRANIAL INC DIAG ANGIO  06/05/2023  IR US GUIDE VASC ACCESS RIGHT  06/05/2023  ORIF ANKLE FRACTURE Left 02/29/2016  Procedure: OPEN REDUCTION INTERNAL FIXATION (ORIF) ANKLE FRACTURE;  Surgeon: Yolonda Kida, MD;  Location: [facility];  Service: Orthopedics;  Laterality: Left;  PROSTATE SURGERY    RADIOLOGY WITH ANESTHESIA N/A 06/05/2023  Procedure: IR WITH ANESTHESIA;  Surgeon: Radiologist, Medication, MD;  Location: [facility];  Service: Radiology;  Laterality: N/A; [place], Riley Nearing 06/07/2023, 12:15 PM  CT HEAD WO CONTRAST ( ) Result Date: 06/07/2023 CLINICAL DATA:  83 year old male status post code stroke presentation, left MCA M2 occlusion, with left MCA infarct with confluent petechial hemorrhage. EXAM: CT HEAD WITHOUT CONTRAST TECHNIQUE: Contiguous axial images were obtained from the base of the skull through the vertex without intravenous contrast. RADIATION DOSE REDUCTION: This exam was performed according to the departmental dose-optimization program which includes automated exposure control, adjustment of the mA and/or kV according to patient size and/or use of iterative reconstruction technique. COMPARISON:  Brain MRI yesterday.  Head CT 06/05/2023. FINDINGS: Brain: Confluent mixed density infarct in the left MCA middle division, epicenter at the insula and operculum. Confluent petechial hemorrhage on series 3, image 16 appears stable from the MRI. Superimposed trace subarachnoid blood is difficult to exclude by CT (series 3, image 13), but was not apparent on MRI. Stable mild mass effect on the left lateral ventricle with only trace rightward midline shift (series 3, image 16). No ventriculomegaly. Elsewhere gray-white  differentiation is stable from the presentation CT. Basilar cisterns remain patent. Vascular: Resolved hyperdense left MCA since presentation. Calcified atherosclerosis at the skull base. Skull: Stable, intact. Sinuses/Orbits: Visualized paranasal sinuses and mastoids are stable and well aerated. Other: Left nasoenteric tube now in place. Stable orbit and scalp soft tissues. IMPRESSION: 1. Stable by CT confluent Left MCA middle division infarct with petechial hemorrhage (Heidelberg classification 1b: HI2, confluent petechiae, no mass effect). Questionable trace superimposed SAH, but was not apparent on MRI. 2. Stable minimal intracranial mass effect. 3. No new intracranial abnormality. Electronically Signed   By: Odessa Fleming M.D.   On: 06/07/2023 05:37   MR BRAIN WO CONTRAST Result Date: 06/06/2023 CLINICAL DATA:  Follow-up examination for stroke. EXAM: MRI HEAD WITHOUT CONTRAST TECHNIQUE: Multiplanar, multiecho pulse sequences of the brain and surrounding structures were obtained without intravenous contrast. COMPARISON:  Comparison made to multiple previous exams from 06/05/2023. FINDINGS: Brain: Examination degraded by motion artifact. Cerebral volume within normal limits for age. Patchy T2/FLAIR hyperintensity involving the periventricular deep white matter both cerebral hemispheres, consistent with chronic small vessel ischemic disease, mild in nature. Small remote infarct noted within the right cerebellum. Confluent restricted diffusion involving the left insula and left frontal operculum, consistent with evolving acute left MCA distribution infarct. Area of infarction measures up to approximately 6 cm in AP diameter. Prominent associated susceptibility artifact, consistent with petechial hemorrhage (series 7, image 61) ( Heidelberg classification 1b: HI2, confluent petechiae, no mass effect. No frank or organized hematoma evident by MRI. No significant regional mass effect. Few additional small foci of  infarction noted  within the left parieto-occipital region posteriorly (series 3, images 40, 29). Apparent diffusion signal along the right frontal parafalcine region felt to be consistent with artifact related to dural calcification. Note made of a few additional chronic micro hemorrhages within the left cerebellum and right thalamus. No mass lesion or midline shift. Ventricles normal size without hydrocephalus. No extra-axial fluid collection. Pituitary gland suprasellar region within normal limits. Vascular: Major intracranial vascular flow voids are maintained. Skull and upper cervical spine: Craniocervical junction within normal limits. Bone marrow signal intensity overall within normal limits. No scalp soft tissue abnormality. Sinuses/Orbits: Prior bilateral ocular lens replacement. Paranasal sinuses are clear. No mastoid effusion. Other: None. IMPRESSION: 1. Evolving acute left MCA distribution infarct, most pronounced at the left insula and left frontal operculum. Prominent associated petechial hemorrhage without frank intraparenchymal hematoma (Heidelberg classification 1b: HI2, confluent petechiae, no mass effect). 2. Few additional small foci of acute infarction within the left parieto-occipital region posteriorly. 3. Underlying mild chronic microvascular ischemic disease with small remote right cerebellar infarct. Electronically Signed   By: Rise Mu M.D.   On: 06/06/2023 03:12   ECHOCARDIOGRAM COMPLETE Result Date: 06/05/2023    ECHOCARDIOGRAM REPORT   Patient Name:   Kentley Blyden. Date of Exam: 06/05/2023 Medical Rec #:  [id number]              Height:       70.0 in Accession #:    [id number]             Weight:       292.3 lb Date of Birth:  November 11, 1940              BSA:          2.453 m Patient Age:    82 years               BP:           129/72 mmHg Patient Gender: M                      HR:           89 bpm. Exam Location:  Inpatient Procedure: 2D Echo, Color Doppler, Cardiac  Doppler and Intracardiac            Opacification Agent Indications:    Stroke I63.9  History:        Patient has prior history of Echocardiogram examinations, most                 recent 10/06/2019. Risk Factors:Diabetes, Hypertension and                 Dyslipidemia.  Sonographer:    Harriette Bouillon RDCS Referring Phys: Lynnae January IMPRESSIONS  1. Left ventricular ejection fraction, by estimation, is 30%. The left ventricle has moderately decreased function. The left ventricle demonstrates global hypokinesis. There is mild left ventricular hypertrophy.  2. Right ventricular systolic function is normal. The right ventricular size is normal.  3. Trivial mitral valve regurgitation.  4. The aortic valve is tricuspid. Aortic valve regurgitation is not visualized.  5. The inferior vena cava is normal in size with greater than 50% respiratory variability, suggesting right atrial pressure of 3 mmHg. Comparison(s): The left ventricular function is worsened. FINDINGS  Left Ventricle: Left ventricular ejection fraction, by estimation, is 30%. The left ventricle has moderately decreased function. The left ventricle demonstrates global hypokinesis. Definity contrast agent was given IV to delineate the  left ventricular endocardial borders. The left ventricular internal cavity size was normal in size. There is mild left ventricular hypertrophy. Right Ventricle: The right ventricular size is normal. Right vetricular wall thickness was not assessed. Right ventricular systolic function is normal. Left Atrium: Left atrial size was normal in size. Right Atrium: Right atrial size was normal in size. Pericardium: There is no evidence of pericardial effusion. Mitral Valve: There is mild thickening of the mitral valve leaflet(s). Mild to moderate mitral annular calcification. Trivial mitral valve regurgitation. Tricuspid Valve: The tricuspid valve is normal in structure. Tricuspid valve regurgitation is trivial. Aortic Valve: The aortic  valve is tricuspid. Aortic valve regurgitation is not visualized. Pulmonic Valve: The pulmonic valve was normal in structure. Pulmonic valve regurgitation is not visualized. Aorta: The aortic root and ascending aorta are structurally normal, with no evidence of dilitation. Venous: The inferior vena cava is normal in size with greater than 50% respiratory variability, suggesting right atrial pressure of 3 mmHg. IAS/Shunts: The interatrial septum was not assessed.  LEFT VENTRICLE PLAX 2D LVIDd:         5.00 cm   Diastology LVIDs:         4.40 cm   LV e' lateral: 5.33 cm/s LV PW:         1.20 cm LV IVS:        1.10 cm LVOT diam:     2.30 cm LV SV:         55 LV SV Index:   23 LVOT Area:     4.15 cm  RIGHT VENTRICLE RV S prime:     14.40 cm/s LEFT ATRIUM         Index LA diam:    4.60 cm 1.88 cm/m  AORTIC VALVE LVOT Vmax:   65.50 cm/s LVOT Vmean:  45.200 cm/s LVOT VTI:    0.133 m  AORTA Ao Root diam: 3.00 cm Ao Asc diam:  3.50 cm  SHUNTS Systemic VTI:  0.13 m Systemic Diam: 2.30 cm Dietrich Pates MD Electronically signed by Dietrich Pates MD Signature Date/Time: 06/05/2023/9:33:38 PM    Final    DG Abd Portable 1V Result Date: 06/05/2023 CLINICAL DATA:  Feeding tube placement. EXAM: PORTABLE ABDOMEN - 1 VIEW COMPARISON:  None Available. FINDINGS: Distal tip of feeding tube is seen in expected position of distal stomach. IMPRESSION: Distal tip of feeding tube seen in expected position of distal stomach. Electronically Signed   By: Lupita Raider M.D.   On: 06/05/2023 15:49   CT HEAD WO CONTRAST Result Date: 06/05/2023 CLINICAL DATA:  Stroke, follow-up. Status post intracranial mechanical thrombectomy and stenting of the left ICA bifurcation. EXAM: CT HEAD WITHOUT CONTRAST TECHNIQUE: Contiguous axial images were obtained from the base of the skull through the vertex without intravenous contrast. RADIATION DOSE REDUCTION: This exam was performed according to the departmental dose-optimization program which includes automated  exposure control, adjustment of the mA and/or kV according to patient size and/or use of iterative reconstruction technique. COMPARISON:  CT head without contrast and CT angio head and neck 06/05/2023. By plain CT 06/05/2023. FINDINGS: Brain: The study is mildly degraded by patient motion. The left insular and left opercular infarct is somewhat obscured by patient motion. The cortex is slightly hyperdense, potentially reflecting reperfusion. No hemorrhage is present. Basal ganglia are intact. Subcortical white matter hypoattenuation in the high left frontal lobe is new. Mild right-sided white matter disease is stable. Basal ganglia are intact. A remote lacunar infarct is again noted  in the right cerebellum. The brainstem and cerebellum are otherwise within normal limits. Vascular: Atherosclerotic calcifications are present within the cavernous internal carotid arteries and at the normal origin of both vertebral arteries. No hyperdense vessel is present. Skull: Calvarium is intact. No focal lytic or blastic lesions are present. No significant extracranial soft tissue lesion is present. Sinuses/Orbits: The paranasal sinuses and mastoid air cells are clear. Bilateral lens replacements are noted. Globes and orbits are otherwise unremarkable. IMPRESSION: 1. The left insular and left opercular infarct is somewhat obscured by patient motion. 2. The cortex is slightly hyperdense, potentially reflecting reperfusion. 3. No hemorrhage. 4. Subcortical white matter hypoattenuation in the high left frontal lobe is new. This may reflect ischemic changes or edema related to the reperfusion. 5. Remote lacunar infarct of the right cerebellum. 6. Stable mild right-sided white matter disease. This likely reflects the sequela of chronic microvascular ischemia. Electronically Signed   By: Marin Roberts M.D.   On: 06/05/2023 14:38   IR PERCUTANEOUS ART THROMBECTOMY/INFUSION INTRACRANIAL INC DIAG ANGIO Result Date:  06/05/2023 INDICATION: 83 year old male presenting with right-sided weakness and aphasia; NIHSS 28. His last known well was 10 p.m. on 06/04/2023. His past medical history significant for prior stroke, hypertension, diabetes and chronic kidney disease; baseline modified Rankin scale 0. Head CT showed hypodensity within the left insula, basal ganglia and left frontal operculum (ASPECTS 7). No IV thrombolytic given as patient was outside the window. CT angiogram of the head and neck showed an occlusion of a left M2/MCA anterior division branch. CT perfusion showed a 41 mL core infarct with a 45 mL ischemic penumbra. She was transferred to our service for mechanical thrombectomy. EXAM: ULTRASOUND-GUIDED VASCULAR ACCESS DIAGNOSTIC CEREBRAL ANGIOGRAM MECHANICAL THROMBECTOMY FLAT PANEL HEAD CT LEFT CAROTID STENTING AND ANGIOPLASTY WITHOUT CEREBRAL PROTECTION DEVICE COMPARISON:  CT/CT angiogram of the head and neck June 05, 2023. MEDICATIONS: No antibiotics administered. ANESTHESIA/SEDATION: The procedure was performed under general anesthesia. CONTRAST:  80 mL of Omnipaque 300 milligram/mL FLUOROSCOPY: Radiation Exposure Index (as provided by the fluoroscopic device): 1315 mGy Kerma COMPLICATIONS: None immediate. TECHNIQUE: Informed written consent was obtained from the patient's wife after a thorough discussion of the procedural risks, benefits and alternatives. All questions were addressed. Maximal Sterile Barrier Technique was utilized including caps, mask, sterile gowns, sterile gloves, sterile drape, hand hygiene and skin antiseptic. A timeout was performed prior to the initiation of the procedure. The right groin was prepped and draped in the usual sterile fashion. Using a micropuncture kit and the modified Seldinger technique, access was gained to the right common femoral artery and an 8 French sheath was placed. Real-time ultrasound guidance was utilized for vascular access including the acquisition of a  permanent ultrasound image documenting patency of the accessed vessel. Under fluoroscopy, an 8 [place] Walrus balloon guide catheter was navigated over a 6 [place] VTK catheter and a 0.035" Terumo Glidewire into the aortic arch. The catheter was placed into the left common carotid artery and then advanced into the left internal carotid artery. The diagnostic catheter was removed. Frontal and lateral angiograms of the head were obtained. FINDINGS: 1. Ultrasound showed heavily calcified right common femoral artery is maintained patency and caliber. 2. Proximal occlusion of a left M2/MCA anterior division branch. 3. Atherosclerotic changes of the intracranial left ICA with mild stenosis at the distal cavernous segment. 4. 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 PROCEDURE: Using biplane  roadmap guidance, a Red 62 aspiration catheter was navigated over Colossus 35 microguidewire into the cavernous segment of the left ICA. The aspiration catheter was then advanced to the level of occlusion and connected to an aspiration pump. Continuous aspiration was performed for 2 minutes. The guide catheter was connected to a VacLok syringe and the guiding catheter balloon was inflated. The aspiration catheter was subsequently removed under constant aspiration. The guide catheter was aspirated for debris. Left internal carotid artery angiograms with frontal and lateral views of the head showed complete recanalization of the left MCA vascular tree. The guide catheter was retracted into the neck. Frontal and lateral angiograms of the neck were obtained. Improvement of the degree of stenosis in the carotid bulb compared to prior CT angiogram. There is prominent luminal irregularity at the carotid bulb residual moderate stenosis. Increased tortuosity of the proximal/mid cervical left ICA with kinking. Left internal carotid artery  angiograms with left anterior oblique views of the neck showed evidence of filling defects at the level of the carotid bulb. Flat panel CT of the head was obtained and post processed in a separate workstation with concurrent attending physician supervision. Selected images were sent to PACS. No evidence of hemorrhagic complication. There is mild contrast staining of the left insular and frontal cortex. Repeat left internal carotid artery angiograms with frontal and lateral views of the head showed Amy seen left M3/MCA branch to the left parietal region. Left common carotid artery angiograms with frontal and lateral views of the neck showed vertebra Gretchen of stenosis at the left ICA bulb with more prominent filling defect. At this point, patient was loaded on cangrelor followed by continuous drip. Using biplane roadmap guidance, a 4-7 mm Emboshield NAV6 cerebral protection device was advanced into the cervical left ICA. However, multiple attempts to advance a cerebral protection device through the left ICA kinking proved unsuccessful. The cerebral protection device was subsequently removed. Using biplane roadmap guidance, a 10-8 x 40 mm XACT carotid stent was navigated and deployed from the distal left common carotid artery to the proximal left internal carotid artery, proximal to the vessel kinking. Suboptimal stent expansion was noted. Then, a 6 x 30 mm Viatrac balloon was navigated into the recently deployed stent. Angioplasty was performed under fluoroscopy. Left internal carotid artery angiograms with frontal and lateral views of the neck showed adequate stent positioning and expansion with brisk anterograde flow. Left internal carotid artery angiograms with frontal and lateral views of the head showed improvement of anterograde flow in the left MCA vascular tree with brisk anterograde flow. Delayed left common carotid artery angiograms with frontal and lateral views of the neck showed no evidence of clot  formation within the stent. The catheter was subsequently Riddle. Right common femoral artery angiogram was obtained in right anterior oblique view. The puncture is at the level of the common femoral artery. The artery has normal caliber, adequate for closure device. The sheath was exchanged over the wire for an 8 [place] Angio-Seal which was utilized for access closure. Immediate hemostasis was achieved. IMPRESSION: 1. Successful mechanical thrombectomy for treatment of a proximal left M2/MCA anterior division branch occlusion achieving complete recanalization (TICI 3). 2. 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 PLAN: Continue cangrelor infusion until patient is transitioned to oral dual antiplatelet therapy. Electronically Signed   By: Baldemar Lenis M.D.   On: 06/05/2023 13:16  IR US Guide Vasc Access Right Result Date: 06/05/2023 INDICATION: 83 year old male presenting with right-sided weakness and aphasia; NIHSS 28. His last known well was 10 p.m. on 06/04/2023. His past medical history significant for prior stroke, hypertension, diabetes and chronic kidney disease; baseline modified Rankin scale 0. Head CT showed hypodensity within the left insula, basal ganglia and left frontal operculum (ASPECTS 7). No IV thrombolytic given as patient was outside the window. CT angiogram of the head and neck showed an occlusion of a left M2/MCA anterior division branch. CT perfusion showed a 41 mL core infarct with a 45 mL ischemic penumbra. She was transferred to our service for mechanical thrombectomy. EXAM: ULTRASOUND-GUIDED VASCULAR ACCESS DIAGNOSTIC CEREBRAL ANGIOGRAM MECHANICAL THROMBECTOMY FLAT PANEL HEAD CT LEFT CAROTID STENTING AND ANGIOPLASTY WITHOUT CEREBRAL PROTECTION DEVICE COMPARISON:  CT/CT angiogram of the head and neck June 05, 2023.  MEDICATIONS: No antibiotics administered. ANESTHESIA/SEDATION: The procedure was performed under general anesthesia. CONTRAST:  80 mL of Omnipaque 300 milligram/mL FLUOROSCOPY: Radiation Exposure Index (as provided by the fluoroscopic device): 1315 mGy Kerma COMPLICATIONS: None immediate. TECHNIQUE: Informed written consent was obtained from the patient's wife after a thorough discussion of the procedural risks, benefits and alternatives. All questions were addressed. Maximal Sterile Barrier Technique was utilized including caps, mask, sterile gowns, sterile gloves, sterile drape, hand hygiene and skin antiseptic. A timeout was performed prior to the initiation of the procedure. The right groin was prepped and draped in the usual sterile fashion. Using a micropuncture kit and the modified Seldinger technique, access was gained to the right common femoral artery and an 8 French sheath was placed. Real-time ultrasound guidance was utilized for vascular access including the acquisition of a permanent ultrasound image documenting patency of the accessed vessel. Under fluoroscopy, an 8 [place] Walrus balloon guide catheter was navigated over a 6 [place] VTK catheter and a 0.035" Terumo Glidewire into the aortic arch. The catheter was placed into the left common carotid artery and then advanced into the left internal carotid artery. The diagnostic catheter was removed. Frontal and lateral angiograms of the head were obtained. FINDINGS: 1. Ultrasound showed heavily calcified right common femoral artery is maintained patency and caliber. 2. Proximal occlusion of a left M2/MCA anterior division branch. 3. Atherosclerotic changes of the intracranial left ICA with mild stenosis at the distal cavernous segment. 4. 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 PROCEDURE: Using biplane roadmap guidance, a Red 62 aspiration  catheter was navigated over Colossus 35 microguidewire into the cavernous segment of the left ICA. The aspiration catheter was then advanced to the level of occlusion and connected to an aspiration pump. Continuous aspiration was performed for 2 minutes. The guide catheter was connected to a VacLok syringe and the guiding catheter balloon was inflated. The aspiration catheter was subsequently removed under constant aspiration. The guide catheter was aspirated for debris. Left internal carotid artery angiograms with frontal and lateral views of the head showed complete recanalization of the left MCA vascular tree. The guide catheter was retracted into the neck. Frontal and lateral angiograms of the neck were obtained. Improvement of the degree of stenosis in the carotid bulb compared to prior CT angiogram. There is prominent luminal irregularity at the carotid bulb residual moderate stenosis. Increased tortuosity of the proximal/mid cervical left ICA with kinking. Left internal carotid artery angiograms with left anterior oblique views of the neck showed evidence of filling defects at  the level of the carotid bulb. Flat panel CT of the head was obtained and post processed in a separate workstation with concurrent attending physician supervision. Selected images were sent to PACS. No evidence of hemorrhagic complication. There is mild contrast staining of the left insular and frontal cortex. Repeat left internal carotid artery angiograms with frontal and lateral views of the head showed Amy seen left M3/MCA branch to the left parietal region. Left common carotid artery angiograms with frontal and lateral views of the neck showed vertebra Gretchen of stenosis at the left ICA bulb with more prominent filling defect. At this point, patient was loaded on cangrelor followed by continuous drip. Using biplane roadmap guidance, a 4-7 mm Emboshield NAV6 cerebral protection device was advanced into the cervical left ICA.  However, multiple attempts to advance a cerebral protection device through the left ICA kinking proved unsuccessful. The cerebral protection device was subsequently removed. Using biplane roadmap guidance, a 10-8 x 40 mm XACT carotid stent was navigated and deployed from the distal left common carotid artery to the proximal left internal carotid artery, proximal to the vessel kinking. Suboptimal stent expansion was noted. Then, a 6 x 30 mm Viatrac balloon was navigated into the recently deployed stent. Angioplasty was performed under fluoroscopy. Left internal carotid artery angiograms with frontal and lateral views of the neck showed adequate stent positioning and expansion with brisk anterograde flow. Left internal carotid artery angiograms with frontal and lateral views of the head showed improvement of anterograde flow in the left MCA vascular tree with brisk anterograde flow. Delayed left common carotid artery angiograms with frontal and lateral views of the neck showed no evidence of clot formation within the stent. The catheter was subsequently Riddle. Right common femoral artery angiogram was obtained in right anterior oblique view. The puncture is at the level of the common femoral artery. The artery has normal caliber, adequate for closure device. The sheath was exchanged over the wire for an 8 [place] Angio-Seal which was utilized for access closure. Immediate hemostasis was achieved. IMPRESSION: 1. Successful mechanical thrombectomy for treatment of a proximal left M2/MCA anterior division branch occlusion achieving complete recanalization (TICI 3). 2. 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 PLAN: Continue cangrelor infusion until patient is transitioned to oral dual antiplatelet therapy. Electronically Signed   By: Baldemar Lenis M.D.   On:  06/05/2023 13:16   IR INTRAVSC STENT CERV CAROTID W/O EMB-PROT MOD SED Result Date: 06/05/2023 INDICATION: 83 year old male presenting with right-sided weakness and aphasia; NIHSS 28. His last known well was 10 p.m. on 06/04/2023. His past medical history significant for prior stroke, hypertension, diabetes and chronic kidney disease; baseline modified Rankin scale 0. Head CT showed hypodensity within the left insula, basal ganglia and left frontal operculum (ASPECTS 7). No IV thrombolytic given as patient was outside the window. CT angiogram of the head and neck showed an occlusion of a left M2/MCA anterior division branch. CT perfusion showed a 41 mL core infarct with a 45 mL ischemic penumbra. She was transferred to our service for mechanical thrombectomy. EXAM: ULTRASOUND-GUIDED VASCULAR ACCESS DIAGNOSTIC CEREBRAL ANGIOGRAM MECHANICAL THROMBECTOMY FLAT PANEL HEAD CT LEFT CAROTID STENTING AND ANGIOPLASTY WITHOUT CEREBRAL PROTECTION DEVICE COMPARISON:  CT/CT angiogram of the head and neck June 05, 2023. MEDICATIONS: No antibiotics administered. ANESTHESIA/SEDATION: The procedure was performed under general anesthesia. CONTRAST:  80 mL of Omnipaque 300 milligram/mL  FLUOROSCOPY: Radiation Exposure Index (as provided by the fluoroscopic device): 1315 mGy Kerma COMPLICATIONS: None immediate. TECHNIQUE: Informed written consent was obtained from the patient's wife after a thorough discussion of the procedural risks, benefits and alternatives. All questions were addressed. Maximal Sterile Barrier Technique was utilized including caps, mask, sterile gowns, sterile gloves, sterile drape, hand hygiene and skin antiseptic. A timeout was performed prior to the initiation of the procedure. The right groin was prepped and draped in the usual sterile fashion. Using a micropuncture kit and the modified Seldinger technique, access was gained to the right common femoral artery and an 8 French sheath was placed. Real-time  ultrasound guidance was utilized for vascular access including the acquisition of a permanent ultrasound image documenting patency of the accessed vessel. Under fluoroscopy, an 8 [place] Walrus balloon guide catheter was navigated over a 6 [place] VTK catheter and a 0.035" Terumo Glidewire into the aortic arch. The catheter was placed into the left common carotid artery and then advanced into the left internal carotid artery. The diagnostic catheter was removed. Frontal and lateral angiograms of the head were obtained. FINDINGS: 1. Ultrasound showed heavily calcified right common femoral artery is maintained patency and caliber. 2. Proximal occlusion of a left M2/MCA anterior division branch. 3. Atherosclerotic changes of the intracranial left ICA with mild stenosis at the distal cavernous segment. 4. 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 PROCEDURE: Using biplane roadmap guidance, a Red 62 aspiration catheter was navigated over Colossus 35 microguidewire into the cavernous segment of the left ICA. The aspiration catheter was then advanced to the level of occlusion and connected to an aspiration pump. Continuous aspiration was performed for 2 minutes. The guide catheter was connected to a VacLok syringe and the guiding catheter balloon was inflated. The aspiration catheter was subsequently removed under constant aspiration. The guide catheter was aspirated for debris. Left internal carotid artery angiograms with frontal and lateral views of the head showed complete recanalization of the left MCA vascular tree. The guide catheter was retracted into the neck. Frontal and lateral angiograms of the neck were obtained. Improvement of the degree of stenosis in the carotid bulb compared to prior CT angiogram. There is prominent luminal irregularity at the carotid bulb residual moderate stenosis. Increased tortuosity  of the proximal/mid cervical left ICA with kinking. Left internal carotid artery angiograms with left anterior oblique views of the neck showed evidence of filling defects at the level of the carotid bulb. Flat panel CT of the head was obtained and post processed in a separate workstation with concurrent attending physician supervision. Selected images were sent to PACS. No evidence of hemorrhagic complication. There is mild contrast staining of the left insular and frontal cortex. Repeat left internal carotid artery angiograms with frontal and lateral views of the head showed Amy seen left M3/MCA branch to the left parietal region. Left common carotid artery angiograms with frontal and lateral views of the neck showed vertebra Gretchen of stenosis at the left ICA bulb with more prominent filling defect. At this point, patient was loaded on cangrelor followed by continuous drip. Using biplane roadmap guidance, a 4-7 mm Emboshield NAV6 cerebral protection device was advanced into the cervical left ICA. However, multiple attempts to advance a cerebral protection device through the left ICA kinking proved unsuccessful. The cerebral protection device was subsequently removed. Using biplane roadmap guidance, a 10-8 x 40 mm XACT carotid stent  was navigated and deployed from the distal left common carotid artery to the proximal left internal carotid artery, proximal to the vessel kinking. Suboptimal stent expansion was noted. Then, a 6 x 30 mm Viatrac balloon was navigated into the recently deployed stent. Angioplasty was performed under fluoroscopy. Left internal carotid artery angiograms with frontal and lateral views of the neck showed adequate stent positioning and expansion with brisk anterograde flow. Left internal carotid artery angiograms with frontal and lateral views of the head showed improvement of anterograde flow in the left MCA vascular tree with brisk anterograde flow. Delayed left common carotid artery  angiograms with frontal and lateral views of the neck showed no evidence of clot formation within the stent. The catheter was subsequently Riddle. Right common femoral artery angiogram was obtained in right anterior oblique view. The puncture is at the level of the common femoral artery. The artery has normal caliber, adequate for closure device. The sheath was exchanged over the wire for an 8 [place] Angio-Seal which was utilized for access closure. Immediate hemostasis was achieved. IMPRESSION: 1. Successful mechanical thrombectomy for treatment of a proximal left M2/MCA anterior division branch occlusion achieving complete recanalization (TICI 3). 2. 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 PLAN: Continue cangrelor infusion until patient is transitioned to oral dual antiplatelet therapy. Electronically Signed   By: Baldemar Lenis M.D.   On: 06/05/2023 13:16   IR CT Head Ltd Result Date: 06/05/2023 INDICATION: 83 year old male presenting with right-sided weakness and aphasia; NIHSS 28. His last known well was 10 p.m. on 06/04/2023. His past medical history significant for prior stroke, hypertension, diabetes and chronic kidney disease; baseline modified Rankin scale 0. Head CT showed hypodensity within the left insula, basal ganglia and left frontal operculum (ASPECTS 7). No IV thrombolytic given as patient was outside the window. CT angiogram of the head and neck showed an occlusion of a left M2/MCA anterior division branch. CT perfusion showed a 41 mL core infarct with a 45 mL ischemic penumbra. She was transferred to our service for mechanical thrombectomy. EXAM: ULTRASOUND-GUIDED VASCULAR ACCESS DIAGNOSTIC CEREBRAL ANGIOGRAM MECHANICAL THROMBECTOMY FLAT PANEL HEAD CT LEFT CAROTID STENTING AND ANGIOPLASTY WITHOUT CEREBRAL PROTECTION DEVICE COMPARISON:   CT/CT angiogram of the head and neck June 05, 2023. MEDICATIONS: No antibiotics administered. ANESTHESIA/SEDATION: The procedure was performed under general anesthesia. CONTRAST:  80 mL of Omnipaque 300 milligram/mL FLUOROSCOPY: Radiation Exposure Index (as provided by the fluoroscopic device): 1315 mGy Kerma COMPLICATIONS: None immediate. TECHNIQUE: Informed written consent was obtained from the patient's wife after a thorough discussion of the procedural risks, benefits and alternatives. All questions were addressed. Maximal Sterile Barrier Technique was utilized including caps, mask, sterile gowns, sterile gloves, sterile drape, hand hygiene and skin antiseptic. A timeout was performed prior to the initiation of the procedure. The right groin was prepped and draped in the usual sterile fashion. Using a micropuncture kit and the modified Seldinger technique, access was gained to the right common femoral artery and an 8 French sheath was placed. Real-time ultrasound guidance was utilized for vascular access including the acquisition of a permanent ultrasound image documenting patency of the accessed vessel. Under fluoroscopy, an 8 [place] Walrus balloon guide catheter was navigated over a 6 [place] VTK catheter and a 0.035" Terumo Glidewire into the aortic arch. The catheter was placed into the left common carotid artery and then advanced into the left  internal carotid artery. The diagnostic catheter was removed. Frontal and lateral angiograms of the head were obtained. FINDINGS: 1. Ultrasound showed heavily calcified right common femoral artery is maintained patency and caliber. 2. Proximal occlusion of a left M2/MCA anterior division branch. 3. Atherosclerotic changes of the intracranial left ICA with mild stenosis at the distal cavernous segment. 4. 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 PROCEDURE:  Using biplane roadmap guidance, a Red 62 aspiration catheter was navigated over Colossus 35 microguidewire into the cavernous segment of the left ICA. The aspiration catheter was then advanced to the level of occlusion and connected to an aspiration pump. Continuous aspiration was performed for 2 minutes. The guide catheter was connected to a VacLok syringe and the guiding catheter balloon was inflated. The aspiration catheter was subsequently removed under constant aspiration. The guide catheter was aspirated for debris. Left internal carotid artery angiograms with frontal and lateral views of the head showed complete recanalization of the left MCA vascular tree. The guide catheter was retracted into the neck. Frontal and lateral angiograms of the neck were obtained. Improvement of the degree of stenosis in the carotid bulb compared to prior CT angiogram. There is prominent luminal irregularity at the carotid bulb residual moderate stenosis. Increased tortuosity of the proximal/mid cervical left ICA with kinking. Left internal carotid artery angiograms with left anterior oblique views of the neck showed evidence of filling defects at the level of the carotid bulb. Flat panel CT of the head was obtained and post processed in a separate workstation with concurrent attending physician supervision. Selected images were sent to PACS. No evidence of hemorrhagic complication. There is mild contrast staining of the left insular and frontal cortex. Repeat left internal carotid artery angiograms with frontal and lateral views of the head showed Amy seen left M3/MCA branch to the left parietal region. Left common carotid artery angiograms with frontal and lateral views of the neck showed vertebra Gretchen of stenosis at the left ICA bulb with more prominent filling defect. At this point, patient was loaded on cangrelor followed by continuous drip. Using biplane roadmap guidance, a 4-7 mm Emboshield NAV6 cerebral protection  device was advanced into the cervical left ICA. However, multiple attempts to advance a cerebral protection device through the left ICA kinking proved unsuccessful. The cerebral protection device was subsequently removed. Using biplane roadmap guidance, a 10-8 x 40 mm XACT carotid stent was navigated and deployed from the distal left common carotid artery to the proximal left internal carotid artery, proximal to the vessel kinking. Suboptimal stent expansion was noted. Then, a 6 x 30 mm Viatrac balloon was navigated into the recently deployed stent. Angioplasty was performed under fluoroscopy. Left internal carotid artery angiograms with frontal and lateral views of the neck showed adequate stent positioning and expansion with brisk anterograde flow. Left internal carotid artery angiograms with frontal and lateral views of the head showed improvement of anterograde flow in the left MCA vascular tree with brisk anterograde flow. Delayed left common carotid artery angiograms with frontal and lateral views of the neck showed no evidence of clot formation within the stent. The catheter was subsequently Riddle. Right common femoral artery angiogram was obtained in right anterior oblique view. The puncture is at the level of the common femoral artery. The artery has normal caliber, adequate for closure device. The sheath was exchanged over the wire for an 8 [place] Angio-Seal which was  utilized for access closure. Immediate hemostasis was achieved. IMPRESSION: 1. Successful mechanical thrombectomy for treatment of a proximal left M2/MCA anterior division branch occlusion achieving complete recanalization (TICI 3). 2. 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 PLAN: Continue cangrelor infusion until patient is transitioned to oral dual antiplatelet therapy. Electronically Signed    By: Baldemar Lenis M.D.   On: 06/05/2023 13:16   CT ANGIO HEAD NECK W WO CM W PERF (CODE STROKE) Addendum Date: 06/05/2023 ADDENDUM REPORT: 06/05/2023 12:25 ADDENDUM: Please note, there is a dictation error within CTA neck impression #1, which should read: The common carotid and internal carotid arteries are patent within the neck. Atherosclerotic plaque bilaterally. Most notably, there is progressive atherosclerotic plaque about the left carotid bifurcation and within the proximal left ICA with resultant severe near occlusive stenosis of the proximal left ICA. Also of note, atherosclerotic plaque about the right carotid bifurcation results in a 40% stenosis at the right ICA origin. Electronically Signed   By: Jackey Loge D.O.   On: 06/05/2023 12:25   Result Date: 06/05/2023 CLINICAL DATA:  Provided history: Cerebrovascular accident, unspecified mechanism. Right-sided weakness. Right-sided facial droop. Altered mental status. EXAM: CT ANGIOGRAPHY HEAD AND NECK CT PERFUSION BRAIN TECHNIQUE: Multidetector CT imaging of the head and neck was performed using the standard protocol during bolus administration of intravenous contrast. Multiplanar CT image reconstructions and MIPs were obtained to evaluate the vascular anatomy. Carotid stenosis measurements (when applicable) are obtained utilizing NASCET criteria, using the distal internal carotid diameter as the denominator. Multiphase CT imaging of the brain was performed following IV bolus contrast injection. Subsequent parametric perfusion maps were calculated using RAPID software. RADIATION DOSE REDUCTION: This exam was performed according to the departmental dose-optimization program which includes automated exposure control, adjustment of the mA and/or kV according to patient size and/or use of iterative reconstruction technique. CONTRAST:  OMNIPAQUE IOHEXOL 350 MG/ML SOLN COMPARISON:  Noncontrast head CT performed earlier today 06/05/2023.  MRA head and MRA neck 10/04/2019. FINDINGS: CTA NECK FINDINGS Aortic arch: Common origin of the innominate and left common carotid arteries. Atherosclerotic plaque within the visualized thoracic aorta and proximal major branch vessels of the neck. Streak/beam hardening artifact arising from a dense right-sided contrast bolus partially obscures the right subclavian artery. Within this limitation, there is no appreciable hemodynamically significant innominate or proximal subclavian artery stenosis. Right carotid system: CCA and ICA patent within the neck. Atherosclerotic plaque, greatest about the carotid bifurcation. Resultant 40% stenosis at the ICA origin. Left carotid system: CCA and ICA patent within the neck. Atherosclerotic plaque. Most notably, there is prominent atherosclerotic plaque about the carotid bifurcation and within the proximal ICA which has progressed from the prior MRA neck of 10/04/2019. Resultant severe (near occlusive) stenosis of the proximal ICA. Tortuosity of the cervical ICA Vertebral arteries: The vertebral arteries are patent within the neck. Streak/beam hardening artifact limits evaluation of the right vertebral artery origin. At least moderate stenosis is suspected at this site. Atherosclerotic plaque scattered elsewhere within the cervical right vertebral artery with no more than mild stenosis. Calcified atherosclerotic plaque at the left vertebral artery origin with suspected at least moderate stenosis. Nonstenotic calcified plaque elsewhere within the cervical left vertebral artery. Skeleton: Cervical spondylosis. Other neck: No neck mass or cervical lymphadenopathy. Upper chest: No consolidation within the imaged lung apices. Review of the MIP images confirms the above  findings CTA HEAD FINDINGS Anterior circulation: The intracranial internal carotid arteries are patent. As sclerotic plaque within both vessels. No more than mild stenosis on the right. Up to moderate stenosis within  the left cavernous segment. The M1 middle cerebral arteries are patent. Abrupt occlusion of a proximal M2 left middle cerebral artery vessel (series 11, image 22). Atherosclerotic irregularity of the M2 and more distal MCA vessels elsewhere. The anterior cerebral arteries are patent. Atherosclerotic irregularity of both vessels without high-grade proximal stenosis. A possible 2 mm periophthalmic left ICA aneurysm with better appreciated on the prior MRA head of 10/04/2019. Posterior circulation: The intracranial vertebral arteries are patent. Atherosclerotic plaque within the right V4 segment sites of mild stenosis. Non-stenotic atherosclerotic plaque within the left V4 segment. The basilar artery is patent. The posterior cerebral arteries are patent. Posterior communicating arteries are diminutive or absent, bilaterally. Venous sinuses: Assessment for dural venous sinus thrombosis is limited due to contrast timing. Anatomic variants: As described. Review of the MIP images confirms the above findings CT Brain Perfusion Findings: ASPECTS: CBF (<30%) Volume: 41mL Perfusion (Tmax>6.0s) volume: 86mL Mismatch Volume: 45mL Infarction Location:Left MCA vascular territory CTA head impression #1, the CT perfusion head impression and the presence of a severe stenosis of the proximal cervical left ICA called by telephone at the time of interpretation on 06/05/2023 at 8:40 am to provider ERIC [facility] , who verbally acknowledged these results. IMPRESSION: CTA neck: 1. No common carotid and internal carotid arteries are patent within the neck. Atherosclerotic plaque bilaterally. Most notably, there is progressive atherosclerotic plaque about the left carotid bifurcation and within the proximal left ICA with resultant severe, near occlusive stenosis of the proximal left ICA. Also of note, atherosclerotic plaque about the right carotid bifurcation results in 40% stenosis at the right ICA origin. 2. The vertebral arteries are patent  within the neck. Atherosclerotic plaque bilaterally as described. Most notably, there is suspected at least moderate stenoses at the bilateral vertebral artery origins. 3. Aortic Atherosclerosis (ICD10-I70.0). CTA head: 1. Abrupt occlusion of a proximal M2 left middle cerebral artery vessel. 2. Background intracranial atherosclerotic disease as described. 3. A possible 2 mm periophthalmic left ICA aneurysm was better appreciated on the prior MRA head of 10/04/2019. CT perfusion head: The perfusion software identifies a 41 mL core infarct in the left MCA vascular territory. The perfusion software identifies an 86 mL region of critically hypoperfused parenchyma within the left MCA vascular territory (utilizing the Tmax>6 seconds threshold). Reported mismatch volume: 45 mL Electronically Signed: By: Jackey Loge D.O. On: 06/05/2023 09:07   CT HEAD CODE STROKE WO CONTRAST Result Date: 06/05/2023 CLINICAL DATA:  Code stroke. Neuro deficit, acute, stroke suspected. EXAM: CT HEAD WITHOUT CONTRAST TECHNIQUE: Contiguous axial images were obtained from the base of the skull through the vertex without intravenous contrast. RADIATION DOSE REDUCTION: This exam was performed according to the departmental dose-optimization program which includes automated exposure control, adjustment of the mA and/or kV according to patient size and/or use of iterative reconstruction technique. COMPARISON:  Brain MRI 10/04/2019.  Noncontrast head CT 10/03/2019. FINDINGS: Brain: Generalized cerebral atrophy. Loss of gray-white differentiation consistent with an acute infarct within the left insula and within portions of the left frontal operculum (MCA vascular territory). Known small chronic cortically-based infarcts within the left frontal, left parietal and left occipital lobes were better appreciated on the prior brain MRI of 10/04/2019 (acute at that time). Mild patchy and ill-defined hypoattenuation within the cerebral white matter,  nonspecific but compatible with  chronic small vessel ischemic disease. Subcentimeter infarct within the superior right cerebellar hemisphere, new from the prior MRI but chronic in appearance. Loss of gray-white differentiation there is no acute intracranial hemorrhage. No extra-axial fluid collection. No evidence of an intracranial mass. No midline shift. Vascular: No hyperdense vessel.  Atherosclerotic calcifications. Skull: No calvarial fracture or aggressive osseous lesion. Sinuses/Orbits: No mass or acute finding within the imaged orbits. No significant paranasal sinus disease. ASPECTS [facility] Stroke Program Early CT Score) - Ganglionic level infarction (caudate, lentiform nuclei, internal capsule, insula, M1-M3 cortex): 5 - Supraganglionic infarction (M4-M6 cortex): 2 Total score (0-10 with 10 being normal): 7 Impression #1 called by telephone at the time of interpretation on 06/05/2023 at 8:40 am to provider Dr. Otelia Limes, who verbally acknowledged these results. IMPRESSION: 1. Acute left MCA territory infarct affecting the left insula and portions of the left frontal operculum. ASPECTS is 7. 2. Known small chronic cortically-based infarcts within the left frontal, left parietal and left occipital lobes were better appreciated on the prior brain MRI of 10/04/2019 (acute at that time). 3. Background mild cerebral white matter chronic small vessel ischemic disease. 4. Subcentimeter infarct within the right cerebellar hemisphere, new from prior MRI but chronic in appearance. 5. Generalized cerebral atrophy. Electronically Signed   By: Jackey Loge D.O.   On: 06/05/2023 08:45     PHYSICAL EXAM  Temp:  [99.1 F (37.3 C)-99.8 F (37.7 C)] 99.3 F (37.4 C) (02/21 1152) Pulse Rate:  [57-112] 101 (02/21 0700) Resp:  [0-45] 31 (02/21 1059) BP: (70-156)/(43-84) 131/62 (02/21 0700) SpO2:  [70 %-100 %] 98 % (02/21 0700) FiO2 (%):  [35 %] 35 % (02/21 1059)  General -intubated elderly patient in no acute  distress  Cardiovascular -regular rhythm on monitor  Neuro - still intubated on vent, eyes closed and not open eyes on voice, with forced eye opening, eyes midline with mild upward gaze, not blinking to visual threat bilaterally, pupils equal and round but small and sluggish, corneal reflexes present, cough and gag reflex present.  Patient does not respond to voice or follow commands.  No significant response to noxious stimuli and no spontaneous movement.   ASSESSMENT/PLAN Mr. Khairi Garman. is a 83 y.o. male with history of hypertension, diabetes, CKD 3, stroke admitted for right-sided weakness numbness, aphasia, right facial droop, left gaze preference. No TNK given due to outside window.  Patient underwent mechanical thrombectomy with left ICA stenting.   2/15 in the morning patient had an episode of vomiting with coffee-ground emesis and subsequent tachycardia and tachypnea.  On evaluation this morning, he had increased work of breathing and was unable to maintain his SpO2 even on 100% O2 via nonrebreather.  Discussion was had with patient's wife and daughter, and family stated they would like to proceed with intubation and would like for patient to remain full code at this time.  CCM was consulted, and patient was transferred to ICU and intubated.  He did have an episode of aspiration during intubation and bronchoscopy was subsequently performed.  He was noted to be hypotensive after intubation, and Levophed and vasopressin were started.  Will hold Plavix for now given coffee-ground emesis.  Patient's creatinine was also noted to have worsened.  Stroke:  left MCA infarct with left M2 occlusion and left ICA near occlusion s/p IR with TICI3 and left ICA stenting, likely secondary to large vessel disease source versus cardiomyopathy with low EF CT left MCA infarct CT head and neck left M2  occlusion, left ICA near occlusion, right ICA 43 stenosis, bilateral VA origin severe stenosis CTP  41/86 Status post IR with TICI3 and left ICA stenting MRI left MCA infarct at left insular and left frontal operculum, prominent and confluent petechial hemorrhagic transformation CT repeat 2/7 stable confluent petechial hemorrhage 2D Echo EF 30% LDL 107 HgbA1c 6.1 P2 Y12 = 73 UDS negative SCDs for VTE prophylaxis aspirin 81 mg daily and clopidogrel 75 mg daily prior to admission, now on ASA and Plavix with stable hemoglobin Ongoing aggressive stroke risk factor management Therapy recommendations:  SNF Disposition: Pending   History of stroke 10/08/2019 admitted for left MCA infarct due to right upper extremity weakness.  MRA head and neck showed left ICA 30% stenosis.  EF 45 to 50%.  LDL 105, A1c 5.6.  Recommended loop recorder at that time but only got 30-day CardioNet monitoring which was no A-fib.  Discharged on DAPT and Lipitor 80.  Respiratory failure Leukocytosis On 2/15, patient had episode of emesis with probable subsequent aspiration and went into respiratory distress with increased work of breathing unable to maintain SpO2 After discussion with family, patient intubated for airway protection and given respiratory failure Now on precedex and fentanyl Ventilator management per CCM - failed SBT 2/19 Fever Tmax 102.5 WBC 10.1--23.5--44.5--41.4--29.9--19.5--7.4--4.9--5.7 Now on cefepime-> Rocephin Off vancomycin  GI bleeding Acute blood loss anemia Patient had 2 episodes of coffee-ground emesis, likely stress ulcer Resume Plavix 2/18 Resume ASA 2/20  Hemoglobin 11.2-> 8.4->PRBC->8.8->8.1-> 7.6->8.3-> 8.6->7.6 Close monitoring  Cardiomyopathy CHF 09/2019 EF 45 to 50% Current admission EF 30% Cardiology on board, appreciate assistance Was on DAPT, now on hold due to GIB Agree with GDMT (losartan, entresto, metoprolol and spironolactone) as BP and Cr tolerates  Diabetes HgbA1c 6.1 goal < 7.0 Hyperglycemia improved  Now off insulin drip CBG monitoring SSI  DM  education and close PCP follow up  History of hypertension, now hypotensive Stable now But requiring Levophed Off epi and vasopressin Long term BP goal normotensive  Hyperlipidemia Home meds: Lipitor 80 LDL 105, goal < 70 Now on lipitor 80 and Zetia  Continue statin and zetia at discharge  Dysphagia Poststroke dysphagia Speech on board N.p.o. now OG tube reinserted Start trickle feeds 2/18  AKI on CKD  Creatinine 1.78->3.41-> 4.61->4.07-> 3.85->3.66->4.19->5.66 Baseline creatinine around 1.5 Renally dose medications as appropriate Avoid contrast Aggressive treat hypotension Nephrology has been consulted  Other Stroke Risk Factors Advanced age Obesity, Body mass index is 42.07 kg/m.   Other Active Problems Presyncopal episode with mild hypotension 2/14-suspect was due to straining to have a bowel movement, will prescribe as needed suppository  Hospital day # 16   Gevena Mart DNP, ACNPC-AG  [facility]  ATTENDING NOTE: I reviewed above note and agree with the assessment and plan. Pt was seen and examined.   Wife at bedside.  Patient lying bed, still intubated, on weaning but seems to have some respite distress with the tube.  Neuro no improvement, not open eyes on voice, with forced eye opening, eyes mildly upward gaze, not blinking to visual threat, pupil equal size sluggish to light.  Not moving extremities on pain summation.  Patient kidney function worsened, creatinine 5.66 today, nephrology consulted.  Hemoglobin 7.6, okay to continue aspirin and Plavix. I had long discussion with wife at bedside along with Dr. Denese Killings, updated pt current condition, treatment plan and potential poor prognosis, and answered all the questions.  She expressed understanding and appreciation.   For detailed assessment and plan, please  refer to above/below as I have made changes wherever appropriate.   Marvel Plan, MD PhD Stroke Neurology 06/21/2023 6:44 PM

## 2023-06-21 NOTE — TOC Progression Note (Signed)
 Transition of Care (TOC) - Progression Note    Patient Details  Name: Nathan Proctor. MRN: [id number] Date of Birth: 1940-11-18  Transition of Care [facility]) CM/SW Contact  Mearl Latin, LCSW Phone Number: 06/21/2023, 10:17 AM  Clinical Narrative:    CSW continuing to follow for medical stability.    Expected Discharge Plan: Skilled Nursing Facility Barriers to Discharge: Continued Medical Work up  Expected Discharge Plan and Services In-house Referral: Clinical Social Work   Post Acute Care Choice: Skilled Nursing Facility Living arrangements for the past 2 months: Single Family Home                                       Social Determinants of Health (SDOH) Interventions SDOH Screenings   Food Insecurity: Patient Unable To Answer (06/07/2023)  Housing: Patient Unable To Answer (06/07/2023)  Transportation Needs: Patient Unable To Answer (06/07/2023)  Utilities: Patient Unable To Answer (06/07/2023)  Depression (PHQ2-9): Low Risk  (12/03/2019)  Social Connections: Patient Unable To Answer (06/07/2023)  Tobacco Use: Medium Risk (06/13/2023)    Readmission Risk Interventions     No data to display

## 2023-06-21 NOTE — Progress Notes (Signed)
 NAME:  Nathan Idrovo., MRN:  [id number], DOB:  April 08, 1941, LOS: 16 ADMISSION DATE:  06/05/2023 CONSULTATION DATE:  06/05/2023 REFERRING MD:  Otelia Limes - Neuro, CHIEF COMPLAINT: Code Stroke, post-NIR   History of Present Illness:  83 year old man who presented to [facility] ED 2/5 via EMS for unresponsiveness and R-sided deficits. PMHx significant for HTN, HLD, prior CVA without residual deficits, T2DM, CKD stage IIIb, prostate CA, gout.  Patient presented to [facility] ED with significant R-sided deficits, aphasia and poor responsiveness. LKW 2200. Code Stroke initiated. On ED arrival, patient was afebrile with HR 86, BP 154/67, RR 20, SpO2 100%. Noted aphasia, R-sided hemiplegia, R-sided facial droop. Labs were notable for WBC 6.8. Hgb 11.9, Plt 181. INR 1.0. Na 142, K 3.8, CO2 20, Cr 1.59 (baseline), LFTs WNL. Ethanol < 10. COVID negative. CT Head with acute L MCA territory infarct affecting L insula/L frontal operculum, small chronic cortically-based infarcts of L frontal/L parietal/L occipital lobes. CTA Head/Neck with abrupt occlusion of proximal M2 L MCA vessel, ?2mm periophthalmic L ICA aneurysm, bilateral ICA plaque with progressive atherosclerotic plaque L carotid bifurcation with proximal L ICA severe, near-occlusive stenosis.   Taken to [facility] emergently for mechanical thrombectomy ([name]). TICI 3 revascularization achieved. L ICA stenosis and plaque rupture noted at the bulb, treated with stenting/angioplasty. Extubated post-procedure and maintained on cangrelor gtt with plan for transition to DAPT.  PCCM consulted for post-procedure medical management.  Pertinent Medical History:   Past Medical History:  Diagnosis Date   Anemia of chronic disease    Ankle fracture 02/15/2016   Cancer (HCC)    Prostate   Chronic constipation    Chronic kidney disease    stage III   Diabetes mellitus without complication (HCC)    type II    Diabetic peripheral neuropathy (HCC)    Failure to  thrive (0-17)    Fracture of left lower leg    Gout    Hyperlipidemia    Hypertension    Morbid obesity (HCC)    Unstable gait    Significant Hospital Events: Including procedures, antibiotic start and stop dates in addition to other pertinent events   2/5 - Presented to [facility] ED with poor responsiveness, aphasia, R-sided deficits. CT Head with L MCA territory infarct. CTA Head/Neck with occlusion of L MCA M2 segment and severe near-occlusive stenosis of L ICA. Taken to [facility] for MT, stent and angioplasty ([name]). PCCM consulted for post-procedure management. 2/6 transfer out of ICU 2/15 aspiration event early a.m. resulting in respiratory distress, transferred back to ICU and emergently intubated with development of severe shock requiring epi, levo, vaso. Transfused PRBC x1 for possible GI bleed 2/16: 3 pressors, weaning. Coox without cardiogenic component. Antibiotics vanc/cefepime. CT CAP with SBO  2/18: weaned off to only levo now which is likely sedation related  2/20: UOP drop overnight with ongoing poor renal function. Weaning sedation to evaluate neuro status.   Interim History / Subjective:  Off sedation. Tolerating SBT but still not awake.   Objective:  Blood pressure 131/62, pulse (!) 101, temperature 99.3 F (37.4 C), temperature source Axillary, resp. rate (!) 31, height 5\' 10"  (1.778 m), weight 133 kg, SpO2 98%.    Vent Mode: PSV;CPAP FiO2 (%):  [35 %] 35 % Set Rate:  [22 bmp] 22 bmp Vt Set:  [570 mL] 570 mL PEEP:  [5 cmH20] 5 cmH20 Pressure Support:  [5 cmH20] 5 cmH20 Plateau Pressure:  [16 cmH20] 16 cmH20  Intake/Output Summary (Last 24 hours) at 06/21/2023 1236 Last data filed at 06/21/2023 1610 Gross per 24 hour  Intake 547.04 ml  Output 695 ml  Net -147.96 ml   Filed Weights   06/18/23 0500 06/19/23 0500 06/20/23 0500  Weight: 132.2 kg 133.4 kg 133 kg   Physical Exam: General: acute on chronically ill appearing older male, laying in bed, no  acute distress. HENT: ETT and OGT in place.  Pulmonary: clear bilaterally, mild increased WOB on PSV Cardiovascular: HS normal with warm extremities.  GI: abdomen protuberant. Active bowel sounds. FMS in place.  Extremities: no pitting edema  Neuro: off sedation, eyes remain closed. No occasionally shakes head from ETT discomfort  GU: minimal urine in Foley.   Ancillary Tests Personally Reviewed:  Na 148 Creatinine 5.66 HB 7.6  Assessment & Plan:Resolved Hospital Problem List  Acute hypoxemic respiratory failure due to aspiration Klebsiella pneumonia  Ileus and swallowing dysfunction from stroke probably contributed.  Left MCA M2 occlusion and left ICA near occlusion now status post thrombectomy with complete revascularization Likely secondary to large vessel obstruction Heart failure with reduced ejection fraction ejection fraction of 30% with global hypokinesis, normal right ventricular function Hypertension Hyperlipidemia Type 2 diabetes Acute kidney injury superimposed on chronic kidney disease stage IIIb Hypernatremia  Hyperchloremia   Plan:  - Keep off sedation and allow patient time to wake up - likely significant drug accumulation due to renal failure.  - Daily SBT. Mental status and ability to protect airway will dictate timing of extubation.  - Worsening renal failure - Nephrology to see. Poor dialysis candidate - Dr Roda Shutters and I discussed the likely poor prognosis given worsening renal function and unclear neurological prognosis.  I have discussed that in spite of maximal treatment the patient is likely to recover to a debilitated state unable to live independently and that such ongoing aggressive interventions may be causing more suffering without a realistic option of recovery.  Palliative care is involved.  Best Practice (right click and "Reselect all SmartList Selections" daily)   Diet/type: tubefeeds and NPO DVT prophylaxis SCD Pressure ulcer(s): N/A GI  prophylaxis: PPI Lines: Central line and yes and it is still needed Foley:  Yes, and it is still needed Code Status:  full code Last date of multidisciplinary goals of care discussion: Continue to update patient and family daily. They are wanting full care at this time.   CRITICAL CARE Performed by: Lynnell Catalan   Total critical care time: 35 minutes  Critical care time was exclusive of separately billable procedures and treating other patients.  Critical care was necessary to treat or prevent imminent or life-threatening deterioration.  Critical care was time spent personally by me on the following activities: development of treatment plan with patient and/or surrogate as well as nursing, discussions with consultants, evaluation of patient's response to treatment, examination of patient, obtaining history from patient or surrogate, ordering and performing treatments and interventions, ordering and review of laboratory studies, ordering and review of radiographic studies, pulse oximetry, re-evaluation of patient's condition and participation in multidisciplinary rounds.  Lynnell Catalan, MD [facility] ICU Physician [facility]  Pager: [phone] Mobile: [phone] After hours: [phone].

## 2023-06-21 NOTE — Consult Note (Addendum)
 [facility] Renal Consultation Note  Requesting MD: Dr. Roda Shutters Indication for Consultation: AKI   HPI:  Nathan Proctor. is a 83 y.o. male with past medical history of CKD3, HTN, and T2DM who presented to the emergency department on 06/04/22 with right sided weakness and and numbness, found to have a LMCA infarct and underwent ICA stenting. On 2/15, he had a significant aspiration event that required intubation, pressor support, and transfer to the ICU. Pt is currently intubated and sedated, so not able to obtain history from patient. Per chart review, seems to have CKD for quite some time, likely secondary to uncontrolled HTN and T2DM. Baseline appears to be around 1.59.    Creatinine  Date/Time Value Ref Range Status  03/06/2016 12:00 AM 1.2 0.6 - 1.3 mg/dL Final  40/98/1191 47:82 AM 1.4 (A) 0.6 - 1.3 mg/dL Final   Creat  Date/Time Value Ref Range Status  10/04/2022 11:30 AM 1.56 (H) 0.70 - 1.22 mg/dL Final  95/62/1308 65:78 AM 1.59 (H) 0.70 - 1.22 mg/dL Final   Creatinine, Ser  Date/Time Value Ref Range Status  06/21/2023 06:02 AM 5.66 (H) 0.61 - 1.24 mg/dL Final  46/96/2952 84:13 AM 4.19 (H) 0.61 - 1.24 mg/dL Final  24/40/1027 25:36 PM 3.66 (H) 0.61 - 1.24 mg/dL Final  64/40/3474 25:95 AM 3.31 (H) 0.61 - 1.24 mg/dL Final  63/87/5643 32:95 AM 3.85 (H) 0.61 - 1.24 mg/dL Final  18/84/1660 63:01 AM 4.07 (H) 0.61 - 1.24 mg/dL Final  60/01/9322 55:73 PM 4.14 (H) 0.61 - 1.24 mg/dL Final  22/05/5425 06:23 AM 4.61 (H) 0.61 - 1.24 mg/dL Final  76/28/3151 76:16 PM 4.35 (H) 0.61 - 1.24 mg/dL Final  07/37/1062 69:48 AM 3.41 (H) 0.61 - 1.24 mg/dL Final  54/62/7035 00:93 AM 1.81 (H) 0.61 - 1.24 mg/dL Final  81/82/9937 16:96 AM 1.78 (H) 0.61 - 1.24 mg/dL Final  78/93/8101 75:10 AM 1.87 (H) 0.61 - 1.24 mg/dL Final  25/85/2778 24:23 AM 2.03 (H) 0.61 - 1.24 mg/dL Final  53/61/4431 54:00 AM 1.98 (H) 0.61 - 1.24 mg/dL Final  86/76/1950 93:26 AM 1.66 (H) 0.61 - 1.24 mg/dL Final   71/24/5809 98:33 AM 1.60 (H) 0.61 - 1.24 mg/dL Final  82/50/5397 67:34 AM 1.59 (H) 0.61 - 1.24 mg/dL Final  19/37/9024 09:73 PM 1.49 (H) 0.61 - 1.24 mg/dL Final  53/29/9242 68:34 AM 1.46 (H) 0.61 - 1.24 mg/dL Final  19/62/2297 98:92 AM 1.43 (H) 0.61 - 1.24 mg/dL Final  11/94/1740 81:44 AM 1.46 (H) 0.61 - 1.24 mg/dL Final  81/85/6314 97:02 AM 1.50 (H) 0.61 - 1.24 mg/dL Final  63/78/5885 02:77 AM 2.08 (H) 0.61 - 1.24 mg/dL Final  41/28/7867 67:20 AM 1.47 (H) 0.61 - 1.24 mg/dL Final  94/70/9628 36:62 AM 1.36 (H) 0.61 - 1.24 mg/dL Final  94/76/5465 03:54 AM 1.50 (H) 0.61 - 1.24 mg/dL Final  65/68/1275 17:00 AM 1.59 (H) 0.61 - 1.24 mg/dL Final  17/49/4496 75:91 PM 1.90 (H) 0.61 - 1.24 mg/dL Final  63/84/6659 93:57 PM 1.85 (H) 0.61 - 1.24 mg/dL Final  01/77/9390 30:09 PM 1.48 (H) 0.61 - 1.24 mg/dL Final  23/30/0762 26:33 PM 1.42 (H) 0.61 - 1.24 mg/dL Final  35/45/6256 38:93 PM 1.56 (H) 0.61 - 1.24 mg/dL Final  73/42/8768 11:57 AM 1.55 (H)  Final     PMHx:   Past Medical History:  Diagnosis Date   Anemia of chronic disease    Ankle fracture 02/15/2016   Cancer (HCC)    Prostate   Chronic constipation  Chronic kidney disease    stage III   Diabetes mellitus without complication (HCC)    type II    Diabetic peripheral neuropathy (HCC)    Failure to thrive (0-17)    Fracture of left lower leg    Gout    Hyperlipidemia    Hypertension    Morbid obesity (HCC)    Unstable gait     Past Surgical History:  Procedure Laterality Date   IR CT HEAD LTD  06/05/2023   IR INTRAVSC STENT CERV CAROTID W/O EMB-PROT MOD SED INC ANGIO  06/05/2023   IR PERCUTANEOUS ART THROMBECTOMY/INFUSION INTRACRANIAL INC DIAG ANGIO  06/05/2023   IR US GUIDE VASC ACCESS RIGHT  06/05/2023   ORIF ANKLE FRACTURE Left 02/29/2016   Procedure: OPEN REDUCTION INTERNAL FIXATION (ORIF) ANKLE FRACTURE;  Surgeon: Yolonda Kida, MD;  Location: [facility];  Service: Orthopedics;  Laterality: Left;   PROSTATE SURGERY      RADIOLOGY WITH ANESTHESIA N/A 06/05/2023   Procedure: IR WITH ANESTHESIA;  Surgeon: Radiologist, Medication, MD;  Location: [facility] OR;  Service: Radiology;  Laterality: N/A;    Family Hx:  Family History  Problem Relation Age of Onset   Heart Problems Father    Heart Problems Sister     Social History:  reports that he quit smoking about 46 years ago. His smoking use included cigarettes. He has never used smokeless tobacco. He reports that he does not currently use alcohol. He reports that he does not use drugs.  Allergies: No Known Allergies  Medications: Prior to Admission medications   Medication Sig Start Date End Date Taking? Authorizing Provider  albuterol (VENTOLIN HFA) 108 (90 Base) MCG/ACT inhaler Inhale 2 puffs into the lungs every 6 (six) hours as needed for wheezing or shortness of breath.   Yes [provider]  allopurinol (ZYLOPRIM) 100 MG tablet Take 100 mg by mouth daily.  01/25/16  Yes [provider]  amLODipine (NORVASC) 2.5 MG tablet 10 mg daily.  11/12/19  Yes [provider]  aspirin EC 81 MG EC tablet Take 1 tablet (81 mg total) by mouth daily. 10/08/19  Yes Danford, Earl Lites, MD  atorvastatin (LIPITOR) 80 MG tablet Take 1 tablet (80 mg total) by mouth daily. Patient taking differently: Take 80 mg by mouth every evening. 10/14/19  Yes Love, Evlyn Kanner, PA-C  cetirizine (ZYRTEC) 10 MG tablet Take 10 mg by mouth daily as needed for allergies.   Yes [provider]  clopidogrel (PLAVIX) 75 MG tablet Take 1 tablet (75 mg total) by mouth daily. 10/14/19  Yes Love, Evlyn Kanner, PA-C  diclofenac Sodium (VOLTAREN) 1 % GEL Apply 2 g topically 4 (four) times daily as needed (for pain). 11/26/19  Yes [provider]  fluticasone (FLONASE) 50 MCG/ACT nasal spray Place 1 spray into both nostrils daily. 04/01/22  Yes Mound, Rolly Salter E, FNP  ibuprofen (ADVIL) 200 MG tablet Take 200 mg by mouth every 6 (six) hours as needed for headache, mild pain (pain  score 1-3) or fever.   Yes [provider]  linaclotide (LINZESS) 145 MCG CAPS capsule Take 145 mcg by mouth daily before breakfast.   Yes [provider]  meclizine (ANTIVERT) 25 MG tablet Take 1 tablet (25 mg total) by mouth 2 (two) times daily as needed for dizziness. Patient taking differently: Take 25 mg by mouth every 8 (eight) hours as needed for dizziness. 01/07/23  Yes Zadie Rhine, MD  polyethylene glycol (MIRALAX / GLYCOLAX) 17 g packet  Take 17 g by mouth daily as needed for mild constipation.   Yes [provider]  Vitamin D, Ergocalciferol, (DRISDOL) 50000 units CAPS capsule Take 50,000 Units by mouth every 7 (seven) days.    Yes [provider]  carvedilol (COREG) 25 MG tablet TAKE 1 TABLET BY MOUTH TWICE DAILY WITH A MEAL Patient not taking: Reported on 06/06/2023 09/05/20   Graciella Freer, PA-C  insulin NPH-regular Human (70-30) 100 UNIT/ML injection Inject 5 Units into the skin daily. Patient not taking: Reported on 06/06/2023 10/14/19   Love, Evlyn Kanner, PA-C  JARDIANCE 10 MG TABS tablet Take 10 mg by mouth daily. Patient not taking: Reported on 06/06/2023 03/31/22   [provider]  losartan (COZAAR) 25 MG tablet Take 1 tablet (25 mg total) by mouth daily. 12/23/19 03/22/20  Graciella Freer, PA-C    I have reviewed the patient's current medications.  Labs:  Results for orders placed or performed during the hospital encounter of 06/05/23 (from the past 48 hours)  Glucose, capillary     Status: Abnormal   Collection Time: 06/19/23 11:24 AM  Result Value Ref Range   Glucose-Capillary 240 (H) 70 - 99 mg/dL    Comment: Glucose reference range applies only to samples taken after fasting for at least 8 hours.  Glucose, capillary     Status: Abnormal   Collection Time: 06/19/23  3:33 PM  Result Value Ref Range   Glucose-Capillary 195 (H) 70 - 99 mg/dL    Comment: Glucose reference range applies only to samples taken after  fasting for at least 8 hours.  CBC     Status: Abnormal   Collection Time: 06/19/23  3:44 PM  Result Value Ref Range   WBC 4.9 4.0 - 10.5 K/uL   RBC 2.71 (L) 4.22 - 5.81 MIL/uL   Hemoglobin 8.3 (L) 13.0 - 17.0 g/dL   HCT 95.2 (L) 84.1 - 32.4 %   MCV 100.0 80.0 - 100.0 fL   MCH 30.6 26.0 - 34.0 pg   MCHC 30.6 30.0 - 36.0 g/dL   RDW 40.1 02.7 - 25.3 %   Platelets 115 (L) 150 - 400 K/uL    Comment: SPECIMEN CHECKED FOR CLOTS REPEATED TO VERIFY    nRBC 0.0 0.0 - 0.2 %    Comment: Performed at [facility], [address], [place], [place] [address]  Glucose, capillary     Status: Abnormal   Collection Time: 06/19/23  7:18 PM  Result Value Ref Range   Glucose-Capillary 211 (H) 70 - 99 mg/dL    Comment: Glucose reference range applies only to samples taken after fasting for at least 8 hours.  Basic metabolic panel     Status: Abnormal   Collection Time: 06/19/23  9:21 PM  Result Value Ref Range   Sodium 146 (H) 135 - 145 mmol/L   Potassium 3.8 3.5 - 5.1 mmol/L   Chloride 112 (H) 98 - 111 mmol/L   CO2 20 (L) 22 - 32 mmol/L   Glucose, Bld 257 (H) 70 - 99 mg/dL    Comment: Glucose reference range applies only to samples taken after fasting for at least 8 hours.   BUN 94 (H) 8 - 23 mg/dL   Creatinine, Ser 3.47 (H) 0.61 - 1.24 mg/dL   Calcium 8.6 (L) 8.9 - 10.3 mg/dL   GFR, Estimated 16 (L) >60 mL/min    Comment: (NOTE) Calculated using the CKD-EPI Creatinine Equation (2021)    Anion gap 14  5 - 15    Comment: Performed at [facility], [address] [place], [place] [address]  Magnesium     Status: None   Collection Time: 06/19/23  9:21 PM  Result Value Ref Range   Magnesium 2.2 1.7 - 2.4 mg/dL    Comment: Performed at [facility], [address] [place], [place] [address]  Glucose, capillary     Status: Abnormal   Collection Time: 06/19/23 11:15 PM  Result Value Ref Range   Glucose-Capillary 219 (H) 70 - 99 mg/dL    Comment: Glucose reference range  applies only to samples taken after fasting for at least 8 hours.  Brain natriuretic peptide     Status: Abnormal   Collection Time: 06/20/23  1:13 AM  Result Value Ref Range   B Natriuretic Peptide 1,337.4 (H) 0.0 - 100.0 pg/mL    Comment: Performed at [facility], [address] [place], [place] [address]  Glucose, capillary     Status: Abnormal   Collection Time: 06/20/23  3:19 AM  Result Value Ref Range   Glucose-Capillary 195 (H) 70 - 99 mg/dL    Comment: Glucose reference range applies only to samples taken after fasting for at least 8 hours.  Cooxemetry Panel (carboxy, met, total hgb, O2 sat)     Status: Abnormal   Collection Time: 06/20/23  4:26 AM  Result Value Ref Range   Total hemoglobin 8.3 (L) 12.0 - 16.0 g/dL   O2 Saturation 03.4 %   Carboxyhemoglobin 2.3 (H) 0.5 - 1.5 %   Methemoglobin <0.7 0.0 - 1.5 %    Comment: Performed at [facility], [address], [place], [place] [address]  Basic metabolic panel     Status: Abnormal   Collection Time: 06/20/23  4:47 AM  Result Value Ref Range   Sodium 149 (H) 135 - 145 mmol/L   Potassium 3.8 3.5 - 5.1 mmol/L   Chloride 114 (H) 98 - 111 mmol/L   CO2 21 (L) 22 - 32 mmol/L   Glucose, Bld 186 (H) 70 - 99 mg/dL    Comment: Glucose reference range applies only to samples taken after fasting for at least 8 hours.   BUN 102 (H) 8 - 23 mg/dL   Creatinine, Ser 5.63 (H) 0.61 - 1.24 mg/dL   Calcium 8.9 8.9 - 87.5 mg/dL   GFR, Estimated 13 (L) >60 mL/min    Comment: (NOTE) Calculated using the CKD-EPI Creatinine Equation (2021)    Anion gap 14 5 - 15    Comment: Performed at [facility], [address] [place], [place] [address]  Magnesium     Status: None   Collection Time: 06/20/23  4:47 AM  Result Value Ref Range   Magnesium 2.4 1.7 - 2.4 mg/dL    Comment: Performed at [facility], [address] [place], [place] [address]  CBC     Status: Abnormal   Collection Time: 06/20/23  4:47 AM  Result  Value Ref Range   WBC 5.7 4.0 - 10.5 K/uL   RBC 2.78 (L) 4.22 - 5.81 MIL/uL   Hemoglobin 8.6 (L) 13.0 - 17.0 g/dL   HCT 41.6 (L) 60.6 - 30.1 %   MCV 100.7 (H) 80.0 - 100.0 fL   MCH 30.9 26.0 - 34.0 pg   MCHC 30.7 30.0 - 36.0 g/dL   RDW 60.1 09.3 - 23.5 %   Platelets 113 (L) 150 - 400 K/uL  nRBC 0.0 0.0 - 0.2 %    Comment: Performed at [facility], [address] [place], [place] [address]  Glucose, capillary     Status: Abnormal   Collection Time: 06/20/23  7:43 AM  Result Value Ref Range   Glucose-Capillary 145 (H) 70 - 99 mg/dL    Comment: Glucose reference range applies only to samples taken after fasting for at least 8 hours.  Magnesium     Status: None   Collection Time: 06/20/23  8:23 AM  Result Value Ref Range   Magnesium 2.3 1.7 - 2.4 mg/dL    Comment: Performed at [facility], [address] [place], [place] [address]  Phosphorus     Status: None   Collection Time: 06/20/23  8:23 AM  Result Value Ref Range   Phosphorus 4.6 2.5 - 4.6 mg/dL    Comment: Performed at [facility], [address], [place], [place] [address]  Glucose, capillary     Status: Abnormal   Collection Time: 06/20/23 11:28 AM  Result Value Ref Range   Glucose-Capillary 195 (H) 70 - 99 mg/dL    Comment: Glucose reference range applies only to samples taken after fasting for at least 8 hours.  Glucose, capillary     Status: Abnormal   Collection Time: 06/20/23  3:34 PM  Result Value Ref Range   Glucose-Capillary 229 (H) 70 - 99 mg/dL    Comment: Glucose reference range applies only to samples taken after fasting for at least 8 hours.  Glucose, capillary     Status: Abnormal   Collection Time: 06/20/23  7:24 PM  Result Value Ref Range   Glucose-Capillary 255 (H) 70 - 99 mg/dL    Comment: Glucose reference range applies only to samples taken after fasting for at least 8 hours.  Glucose, capillary     Status: Abnormal   Collection Time: 06/20/23 11:16 PM  Result Value Ref  Range   Glucose-Capillary 265 (H) 70 - 99 mg/dL    Comment: Glucose reference range applies only to samples taken after fasting for at least 8 hours.  Glucose, capillary     Status: Abnormal   Collection Time: 06/21/23  3:35 AM  Result Value Ref Range   Glucose-Capillary 230 (H) 70 - 99 mg/dL    Comment: Glucose reference range applies only to samples taken after fasting for at least 8 hours.  Cooxemetry Panel (carboxy, met, total hgb, O2 sat)     Status: Abnormal   Collection Time: 06/21/23  6:02 AM  Result Value Ref Range   Total hemoglobin 8.1 (L) 12.0 - 16.0 g/dL   O2 Saturation 27.2 %   Carboxyhemoglobin 1.9 (H) 0.5 - 1.5 %   Methemoglobin 1.1 0.0 - 1.5 %    Comment: Performed at [facility], [address], [place], [place] [address]  Basic metabolic panel     Status: Abnormal   Collection Time: 06/21/23  6:02 AM  Result Value Ref Range   Sodium 148 (H) 135 - 145 mmol/L   Potassium 3.9 3.5 - 5.1 mmol/L   Chloride 114 (H) 98 - 111 mmol/L   CO2 20 (L) 22 - 32 mmol/L   Glucose, Bld 227 (H) 70 - 99 mg/dL    Comment: Glucose reference range applies only to samples taken after fasting for at least 8 hours.   BUN 131 (H) 8 - 23 mg/dL   Creatinine, Ser 4.03 (H) 0.61 - 1.24 mg/dL  Calcium 9.0 8.9 - 10.3 mg/dL   GFR, Estimated 9 (L) >60 mL/min    Comment: (NOTE) Calculated using the CKD-EPI Creatinine Equation (2021)    Anion gap 14 5 - 15    Comment: Performed at [facility], [address] [place], [place] [address]  CBC     Status: Abnormal   Collection Time: 06/21/23  6:02 AM  Result Value Ref Range   WBC 7.3 4.0 - 10.5 K/uL   RBC 2.45 (L) 4.22 - 5.81 MIL/uL   Hemoglobin 7.6 (L) 13.0 - 17.0 g/dL   HCT 60.4 (L) 54.0 - 98.1 %   MCV 103.3 (H) 80.0 - 100.0 fL   MCH 31.0 26.0 - 34.0 pg   MCHC 30.0 30.0 - 36.0 g/dL   RDW 19.1 47.8 - 29.5 %   Platelets 110 (L) 150 - 400 K/uL   nRBC 0.0 0.0 - 0.2 %    Comment: Performed at [facility], [address], [place], [place] [address]  Magnesium     Status: None   Collection Time: 06/21/23  6:02 AM  Result Value Ref Range   Magnesium 2.4 1.7 - 2.4 mg/dL    Comment: Performed at [facility], [address], [place], [place] [address]  Phosphorus     Status: Abnormal   Collection Time: 06/21/23  6:02 AM  Result Value Ref Range   Phosphorus 5.4 (H) 2.5 - 4.6 mg/dL    Comment: Performed at [facility], [address] [place], [place] [address]  Glucose, capillary     Status: Abnormal   Collection Time: 06/21/23  7:50 AM  Result Value Ref Range   Glucose-Capillary 228 (H) 70 - 99 mg/dL    Comment: Glucose reference range applies only to samples taken after fasting for at least 8 hours.     ROS:  Pertinent items are noted in HPI.  Physical Exam: Vitals:   06/21/23 0750 06/21/23 0800  BP:    Pulse:    Resp: (!) 31   Temp:  99.8 F (37.7 C)  SpO2:       General: Ill-appearing male, sedated HEENT: Dry mucous membranes Neck: No JVP appreciated Heart: RRR Lungs: On ventilator, lungs clear to auscultation Abdomen: Mildly distended Extremities: +2 lower extremity edema Skin: Multiple abrasions on lower extremities Neuro: Sedated  Assessment/Plan:  AKI on CKD3 Baseline creatinine 1.59, currently at 5.66 with BUN at 136. He does have a foley in with - of output yesterday, on my exam today there was very minimal urine in his foley bag. Blood pressures have been stable with transient drop once to 70/43, however MAPS have been above 65. On imaging he does have exophytic cysts on his kidney bilaterally, however unsure if they have increased in size or amount since his last CT Abdomen in 2002. Appears he was on vancomycin and was stopped on 2/17. He was given a dose of Lasix 100mg  for volume removal, however creatinine continued to rise. Pressors were discontinued on 2/19.  Overall, given recent pressor use, AKI likely in the setting of ATN. Will try lasix challenge today  with 120mg , and follow up output.   Plan:  - Holding on renal ultrasound because of indwelling catheter  - Avoid nephrotoxic medications  - Lasix challenge 120mg , will follow output.  - Poor HD candidate if necessary  AHRF  Klebsiella Pneumonia  Seosus On ventilator, management per PCCM.   Coffee Ground Emesis Hb trending down  from 8.6 to 7.6.   Diabetes  Management per primary    Shavonte Zhao 06/21/2023, 10:44 AM

## 2023-06-21 NOTE — Progress Notes (Signed)
 Nutrition Follow-up  DOCUMENTATION CODES:   Non-severe (moderate) malnutrition in context of chronic illness  INTERVENTION:   Tube feeding via OG tube: Osmolite 1.5 at 20 ml/h  Advance Osmolite 1.5 by 10 ml every 8 hours to goal rate of 65 ml/hr (1560 ml per day) Prosource TF20 60 ml BID  Provides 2500 kcal, 137 gm protein, 1188 ml free water daily  100 mg thiamine via tube Rena-vit daily  300 ml free water every 4 hours Total free water: 2988 ml   NUTRITION DIAGNOSIS:   Moderate Malnutrition related to chronic illness as evidenced by severe muscle depletion, mild fat depletion, severe fat depletion. Ongoing.   GOAL:   Patient will meet greater than or equal to 90% of their needs Progressing with TF initiation   MONITOR:   TF tolerance, Weight trends, Labs, Diet advancement  REASON FOR ASSESSMENT:   Consult Enteral/tube feeding initiation and management  ASSESSMENT:   Pt admitted for acute ischemic stroke with right sides weakness, right facial droop, and aphagia. Pt with PMH of HTN, DM, CKD III, prior CVA w/ no residual deficits.  Pt discussed during ICU rounds and with RN and MD.  Per MD plan to keep off sedation to allow time to wake up, continue daily SBT. Nephrology consult pending for worsening kidney failure. Per MD poor prognosis    2/05 - s/p IR mechanical thrombectomy and carotid stent placement, Cortrak placed (tip gastric) 2/07 - s/p MBS with recommendation for dysphagia 1 diet with honey-thick liquids, Transferred to progressive unit 2/09 - Cortrak and tube feeds discontinued 2/12 - repeat MBS with recommendation for dysphagia 2 diet with thin liquids 2/15 - Transferred to ICU for respiratory distress, Intubated  2/16 -  CT abdomen concerning for either close loop obstruction or internal hernia 2/18 - start trickle TF   Medications reviewed and include: lipitor, dulcolax daily (2/19), colace BID (2/19), SSI every 4 hours, 8 units semglee BID,  reglan 5 mg every 6 hours (2/18), rena-vit, protonix, miralax BID (incr 2/20), senokot-s BID (incr 2/20), thiamine  Precedex Fentanyl   Labs reviewed:  Na 149 CBG's: 145-229   OG tube; tip in distal stomach per xray   Current weight: 133 kg Admission weight: 132.6 kg   Diet Order:   Diet Order             Diet NPO time specified  Diet effective now                   EDUCATION NEEDS:   Education needs have been addressed  Skin:  Skin Assessment: Skin Integrity Issues: Skin Integrity Issues:: Stage III Stage II: L scrotum Stage III: L scrotum Incisions: R thigh  Last BM:  2/21 x 1, FMS inserted, no volume recorded. Stool seen within the tubing and bag  Height:   Ht Readings from Last 1 Encounters:  06/20/23 5\' 10"  (1.778 m)    Weight:   Wt Readings from Last 1 Encounters:  06/20/23 133 kg    Ideal Body Weight:  68.2 kg  BMI:  Body mass index is 42.07 kg/m.  Estimated Nutritional Needs:   Kcal:  2300-2500  Protein:  115-130 g  Fluid:  2.4 L  Cledis Sohn P., RD, LDN, CNSC See AMiON for contact information

## 2023-06-21 NOTE — Procedures (Signed)
 Cortrak  Person Inserting Tube:  Greig Castilla D, RD Tube Type:  Cortrak - 43 inches Tube Size:  10 Tube Location:  Left nare Initial Placement:  Stomach Secured by: Bridle Technique Used to Measure Tube Placement:  Marking at nare/corner of mouth Cortrak Secured At:  67 cm Procedure Comments:  Cortrak Tube Team Note:  Consult received to place a Cortrak feeding tube.   No x-ray is required. RN may begin using tube.   If the tube becomes dislodged please keep the tube and contact the Cortrak team at [email or web address] for replacement.  If after hours and replacement cannot be delayed, place a NG tube and confirm placement with an abdominal x-ray.    Greig Castilla, RD, LDN Registered Dietitian II Please reach out via secure chat Weekend on-call pager # available in [facility]

## 2023-06-22 ENCOUNTER — Inpatient Hospital Stay (HOSPITAL_COMMUNITY): Payer: No Typology Code available for payment source

## 2023-06-22 DIAGNOSIS — J9601 Acute respiratory failure with hypoxia: Secondary | ICD-10-CM | POA: Diagnosis not present

## 2023-06-22 DIAGNOSIS — I639 Cerebral infarction, unspecified: Secondary | ICD-10-CM

## 2023-06-22 DIAGNOSIS — K567 Ileus, unspecified: Secondary | ICD-10-CM | POA: Diagnosis not present

## 2023-06-22 DIAGNOSIS — J150 Pneumonia due to Klebsiella pneumoniae: Secondary | ICD-10-CM | POA: Diagnosis not present

## 2023-06-22 LAB — RENAL FUNCTION PANEL
Albumin: 1.8 g/dL — ABNORMAL LOW (ref 3.5–5.0)
Anion gap: 10 (ref 5–15)
BUN: 133 mg/dL — ABNORMAL HIGH (ref 8–23)
CO2: 22 mmol/L (ref 22–32)
Calcium: 8.4 mg/dL — ABNORMAL LOW (ref 8.9–10.3)
Chloride: 111 mmol/L (ref 98–111)
Creatinine, Ser: 5.83 mg/dL — ABNORMAL HIGH (ref 0.61–1.24)
GFR, Estimated: 9 mL/min — ABNORMAL LOW (ref >60)
Glucose, Bld: 289 mg/dL — ABNORMAL HIGH (ref 70–99)
Phosphorus: 4.3 mg/dL (ref 2.5–4.6)
Potassium: 3.9 mmol/L (ref 3.5–5.1)
Sodium: 143 mmol/L (ref 135–145)

## 2023-06-22 LAB — BASIC METABOLIC PANEL
Anion gap: 13 (ref 5–15)
BUN: 154 mg/dL — ABNORMAL HIGH (ref 8–23)
CO2: 20 mmol/L — ABNORMAL LOW (ref 22–32)
Calcium: 8.8 mg/dL — ABNORMAL LOW (ref 8.9–10.3)
Chloride: 111 mmol/L (ref 98–111)
Creatinine, Ser: 6.92 mg/dL — ABNORMAL HIGH (ref 0.61–1.24)
GFR, Estimated: 7 mL/min — ABNORMAL LOW (ref >60)
Glucose, Bld: 315 mg/dL — ABNORMAL HIGH (ref 70–99)
Potassium: 3.8 mmol/L (ref 3.5–5.1)
Sodium: 144 mmol/L (ref 135–145)

## 2023-06-22 LAB — GLUCOSE, CAPILLARY
Glucose-Capillary: 288 mg/dL — ABNORMAL HIGH (ref 70–99)
Glucose-Capillary: 277 mg/dL — ABNORMAL HIGH (ref 70–99)
Glucose-Capillary: 275 mg/dL — ABNORMAL HIGH (ref 70–99)
Glucose-Capillary: 256 mg/dL — ABNORMAL HIGH (ref 70–99)
Glucose-Capillary: 270 mg/dL — ABNORMAL HIGH (ref 70–99)
Glucose-Capillary: 266 mg/dL — ABNORMAL HIGH (ref 70–99)

## 2023-06-22 MED ORDER — MIDAZOLAM HCL 2 MG/2ML IJ SOLN
INTRAMUSCULAR | Status: AC
  Filled 2023-06-22: qty 2

## 2023-06-22 MED ORDER — SODIUM CHLORIDE 0.9 % FOR CRRT: INTRAVENOUS_CENTRAL | Status: DC | PRN

## 2023-06-22 MED ORDER — ALBUMIN HUMAN 25 % IV SOLN: 25.0000 g | Freq: Once | INTRAVENOUS | Status: AC

## 2023-06-22 MED ORDER — MIDAZOLAM HCL 2 MG/2ML IJ SOLN
2.0000 mg | Freq: Once | INTRAMUSCULAR | Status: AC
Start: 2023-06-22 — End: 2023-06-22
  Administered 2023-06-22: 2 mg via INTRAVENOUS

## 2023-06-22 MED ORDER — HEPARIN SODIUM (PORCINE) 1000 UNIT/ML DIALYSIS
1000.0000 [IU] | INTRAMUSCULAR | Status: DC | PRN
  Administered 2023-06-23 – 2023-06-26 (×2): 3000 [IU] via INTRAVENOUS_CENTRAL
  Filled 2023-06-22: qty 3
  Filled 2023-06-22: qty 6
  Filled 2023-06-22: qty 5
  Filled 2023-06-22: qty 6

## 2023-06-22 MED ORDER — ALBUMIN HUMAN 5 % IV SOLN
INTRAVENOUS | Status: AC
  Filled 2023-06-22: qty 250

## 2023-06-22 MED ORDER — OSMOLITE 1.5 CAL PO LIQD
1000.0000 mL | ORAL | Status: DC
  Administered 2023-06-22 – 2023-07-04 (×10): 1000 mL

## 2023-06-22 MED ORDER — DEXMEDETOMIDINE HCL IN NACL 400 MCG/100ML IV SOLN
0.0000 ug/kg/h | INTRAVENOUS | Status: DC
  Administered 2023-06-22: 0.4 ug/kg/h via INTRAVENOUS
  Administered 2023-06-23: 1 ug/kg/h via INTRAVENOUS
  Administered 2023-06-23: 0.7 ug/kg/h via INTRAVENOUS
  Administered 2023-06-23: 0.9 ug/kg/h via INTRAVENOUS
  Filled 2023-06-22 (×4): qty 100

## 2023-06-22 MED ORDER — PRISMASOL BGK 4/2.5 32-4-2.5 MEQ/L EC SOLN: Status: DC

## 2023-06-22 NOTE — Progress Notes (Signed)
 STROKE TEAM PROGRESS NOTE   SUBJECTIVE (INTERVAL HISTORY) Patient seen and evaluated this morning. No acute overnight events noted.   Patient remains intubated, not on any sedation, and is currently weaning. Neurologically his exam is unchanged His creatinine has increased from 5.66 to 6.92. Renal following   OBJECTIVE Temp:  [97.7 F (36.5 C)-98.6 F (37 C)] 98.6 F (37 C) (02/22 1200) Pulse Rate:  [67-103] 88 (02/22 1548) Cardiac Rhythm: Normal sinus rhythm (02/22 0800) Resp:  [5-45] 28 (02/22 1548) BP: (65-164)/(29-116) 140/51 (02/22 1300) SpO2:  [88 %-100 %] 93 % (02/22 1548) FiO2 (%):  [35 %-50 %] 40 % (02/22 1140) Weight:  [138.5 kg] 138.5 kg (02/22 0500)  Recent Labs  Lab 06/21/23 2315 06/22/23 0311 06/22/23 0815 06/22/23 1124 06/22/23 1539  GLUCAP 300* 288* 277* 275* 256*   Recent Labs  Lab 06/17/23 0423 06/18/23 0124 06/19/23 0031 06/19/23 2121 06/20/23 0447 06/20/23 0823 06/21/23 0602 06/22/23 0608  NA 143   < > 144 146* 149*  --  148* 144  K 4.6   < > 3.6 3.8 3.8  --  3.9 3.8  CL 109   < > 110 112* 114*  --  114* 111  CO2 20*   < > 23 20* 21*  --  20* 20*  GLUCOSE 150*   < > 172* 257* 186*  --  227* 315*  BUN 67*   < > 72* 94* 102*  --  131* 154*  CREATININE 4.07*   < > 3.31* 3.66* 4.19*  --  5.66* 6.92*  CALCIUM 8.6*   < > 8.8* 8.6* 8.9  --  9.0 8.8*  MG 2.1  --  2.2 2.2 2.4 2.3 2.4  --   PHOS 4.8*  --   --   --   --  4.6 5.4*  --    < > = values in this interval not displayed.   Recent Labs  Lab 06/16/23 0414 06/17/23 0423  AST 44* 35  ALT 30 29  ALKPHOS 45 46  BILITOT 1.1 1.1  PROT 5.5* 5.4*  ALBUMIN 2.5* 2.2*    Recent Labs  Lab 06/18/23 0758 06/19/23 0031 06/19/23 1544 06/20/23 0447 06/21/23 0602  WBC 19.5* 7.4 4.9 5.7 7.3  HGB 7.6* 7.7* 8.3* 8.6* 7.6*  HCT 24.6* 25.2* 27.1* 28.0* 25.3*  MCV 99.2 100.8* 100.0 100.7* 103.3*  PLT 157 118* 115* 113* 110*       Component Value Date/Time   CHOL 183 06/06/2023 0557   TRIG 109  06/19/2023 0031   HDL 60 06/06/2023 0557   CHOLHDL 3.1 06/06/2023 0557   VLDL 16 06/06/2023 0557   LDLCALC 107 (H) 06/06/2023 0557   LDLCALC 223 (H) 05/31/2021 0000   Lab Results  Component Value Date   HGBA1C 6.1 (H) 06/05/2023      Component Value Date/Time   LABOPIA NONE DETECTED 06/05/2023 1653   COCAINSCRNUR NONE DETECTED 06/05/2023 1653   LABBENZ NONE DETECTED 06/05/2023 1653   AMPHETMU NONE DETECTED 06/05/2023 1653   THCU NONE DETECTED 06/05/2023 1653   LABBARB NONE DETECTED 06/05/2023 1653    No results for input(s): "ETH" in the last 168 hours.   I have personally reviewed the radiological images below and agree with the radiology interpretations.  DG Chest Port 1 View Result Date: 06/22/2023 CLINICAL DATA:  Central line placement. EXAM: PORTABLE CHEST 1 VIEW COMPARISON:  June 19, 2023. FINDINGS: Stable cardiomediastinal silhouette. Hypoinflation of the lungs is noted with bibasilar subsegmental atelectasis and  probable small pleural effusions. Endotracheal tube and feeding tube are unchanged. Left internal jugular catheter is unchanged. Interval placement of right internal jugular catheter with distal tip in expected position of the SVC. No pneumothorax. IMPRESSION: Interval placement of right internal jugular catheter with distal tip in expected position of the SVC. Electronically Signed   By: Lupita Raider M.D.   On: 06/22/2023 11:36   DG CHEST PORT 1 VIEW Result Date: 06/19/2023 CLINICAL DATA:  Respiratory distress. EXAM: PORTABLE CHEST 1 VIEW COMPARISON:  Radiograph 06/15/2023, CT 06/16/2023 FINDINGS: Tip of the endotracheal tube 4.5 cm from the carina. Enteric tube and left central line unchanged in positioning. Slight improved right lung aeration with persistent basilar opacity. Slight worsening patchy opacity at the left lung base. No pneumothorax or large pleural effusion. No pulmonary edema. Stable heart size and mediastinal contours. IMPRESSION: 1. Bibasilar  opacities, improving on the right but worsening on the left. 2. Stable support apparatus. Electronically Signed   By: Narda Rutherford M.D.   On: 06/19/2023 16:02   DG Abd Portable 1V Result Date: 06/18/2023 CLINICAL DATA:  Orogastric tube placement. EXAM: PORTABLE ABDOMEN - 1 VIEW COMPARISON:  Radiographs 06/05/2023.  Abdominal CT 06/16/2023. FINDINGS: 1236 hours. Tip of the enteric tube projects over the right upper quadrant of the abdomen, likely in the distal stomach. There is a small amount contrast material within the stomach. The visualized bowel gas pattern is normal. Patient is rotated to the right with persistent right basilar airspace disease and a small right pleural effusion. IMPRESSION: Enteric tube tip projects over the distal stomach. Electronically Signed   By: Carey Bullocks M.D.   On: 06/18/2023 14:48   CT CHEST ABDOMEN PELVIS WO CONTRAST Result Date: 06/16/2023 CLINICAL DATA:  Septicemia.  Pneumonia. EXAM: CT CHEST, ABDOMEN AND PELVIS WITHOUT CONTRAST TECHNIQUE: Multidetector CT imaging of the chest, abdomen and pelvis was performed following the standard protocol without IV contrast. RADIATION DOSE REDUCTION: This exam was performed according to the departmental dose-optimization program which includes automated exposure control, adjustment of the mA and/or kV according to patient size and/or use of iterative reconstruction technique. COMPARISON:  CT angio chest from 04/04/2016. FINDINGS: CT CHEST FINDINGS Cardiovascular: Heart size is normal. Aortic atherosclerosis and multi vessel coronary artery calcifications. No pericardial effusion. Mediastinum/Nodes: Thyroid gland, trachea and esophagus appear normal. ET tube is in place with tip above the carina. There is a enteric tube with tip in the stomach.No enlarged mediastinal lymph nodes. Hilar lymph nodes are suboptimally evaluated due to lack of IV contrast. Lungs/Pleura: No pleural effusion or interstitial edema. Bilateral lower lobe  airspace consolidation is identified, right greater than left. Imaging findings compatible with multifocal pneumonia. Chronic subsegmental atelectasis this is within the right middle lobe and right lower lobe with asymmetric volume loss and elevation of the right hemidiaphragm. Musculoskeletal: No chest wall mass or suspicious bone lesions identified. CT ABDOMEN PELVIS FINDINGS Hepatobiliary: No focal liver abnormality. Gallbladder appears normal. No signs of bile duct dilatation. Pancreas: Unremarkable. No pancreatic ductal dilatation or surrounding inflammatory changes. Spleen: Normal in size without focal abnormality. Adrenals/Urinary Tract: Normal adrenal glands. Multiple stones identified within the upper pole of the left kidney which measure up to 7 mm. Bilateral exophytic kidney lesions of varying complexity are identified. These are suboptimally assessed on the current exam reflecting lack of IV contrast material. No signs of hydronephrosis. Urinary bladder is decompressed around a Foley catheter. Stomach/Bowel: Enteric tube tip is in the proximal stomach. There is retained enteric  contrast material within the gastric fundus. The appendix is visualized and appears normal. There are several dilated loops of small bowel within the right hemiabdomen. The proximal small bowel loops are normal in caliber. Within the right upper quadrant of the abdomen there are multiple dilated loops of small bowel measuring up to 3.5 cm containing air-fluid levels. The bowel loops proximal and distal are normal in caliber. Within the limitations of unenhanced technique there is no significant bowel wall thickening. Distal colonic diverticulosis identified without signs of acute diverticulitis. Retained enteric contrast material identified within the colon up to the rectum Vascular/Lymphatic: Aortic atherosclerosis without aneurysm. No signs of abdominopelvic adenopathy. Reproductive: Prostate gland is either atrophic or  surgically absent. Penile prosthesis identified. Other: No free fluid or fluid collections. Musculoskeletal: No acute or significant osseous findings. IMPRESSION: 1. Bilateral lower lobe airspace consolidation, right greater than left. Imaging findings compatible with multifocal pneumonia. 2. Chronic subsegmental atelectasis within the right middle lobe and right lower lobe with asymmetric volume loss and elevation of the right hemidiaphragm. 3. Assessment of bowel pathology is limited due to lack of IV and enteric contrast material. There are multiple abnormally dilated loops of small bowel within the right hemiabdomen. Findings are compatible with a small-bowel obstruction. As there is normal caliber proximal small bowel and normal caliber distal small bowel findings are concerning for either close loop obstruction or internal hernia. 4. Distal colonic diverticulosis without signs of acute diverticulitis. 5. Nonobstructing left renal calculi. 6. Bilateral exophytic kidney lesions of varying complexity are identified. These are suboptimally assessed on the current exam reflecting lack of IV contrast material. Further evaluation with nonemergent renal ultrasound is advised. 7.  Aortic Atherosclerosis (ICD10-I70.0). Electronically Signed   By: Signa Kell M.D.   On: 06/16/2023 15:51   ECHOCARDIOGRAM LIMITED Result Date: 06/15/2023    ECHOCARDIOGRAM LIMITED REPORT   Patient Name:   Nathan Proctor. Date of Exam: 06/15/2023 Medical Rec #:  [id number]              Height:       70.0 in Accession #:    [id number]             Weight:       263.9 lb Date of Birth:  1940/12/13              BSA:          2.348 m Patient Age:    83 years               BP:           121/53 mmHg Patient Gender: M                      HR:           90 bpm. Exam Location:  Inpatient Procedure: Limited Echo, Color Doppler and Intracardiac Opacification Agent            (Both Spectral and Color Flow Doppler were utilized during             procedure). Indications:    Stroke i63.9, Shock  History:        Patient has prior history of Echocardiogram examinations, most                 recent 06/05/2023. CHF; Risk Factors:Hypertension and                 Dyslipidemia.  Sonographer:  [facility] Senior RDCS Referring Phys: [id number] [facility] Mechele Collin  Sonographer Comments: Technically difficult due to body habitus, scanned supine on artificial repirator IMPRESSIONS  1. Very difficult to determine LVEF to limited visualization and frequent ectopy. . Left ventricular ejection fraction, by estimation, is 40 to 45%. The left ventricle has mildly decreased function. Left ventricular endocardial border not optimally defined to evaluate regional wall motion.  2. Right ventricular systolic function was not well visualized. The right ventricular size is not well visualized.  3. The inferior vena cava is normal in size with <50% respiratory variability, suggesting right atrial pressure of 8 mmHg.  4. Limited echo FINDINGS  Left Ventricle: Very difficult to determine LVEF to limited visualization and frequent ectopy. Left ventricular ejection fraction, by estimation, is 40 to 45%. The left ventricle has mildly decreased function. Left ventricular endocardial border not optimally defined to evaluate regional wall motion. Definity contrast agent was given IV to delineate the left ventricular endocardial borders. Right Ventricle: The right ventricular size is not well visualized. Right vetricular wall thickness was not well visualized. Right ventricular systolic function was not well visualized. Venous: The inferior vena cava is normal in size with less than 50% respiratory variability, suggesting right atrial pressure of 8 mmHg. Additional Comments: Color Doppler performed.  Dina Rich MD Electronically signed by Dina Rich MD Signature Date/Time: 06/15/2023/4:31:38 PM    Final    DG Abd 1 View Result Date: 06/15/2023 CLINICAL DATA:  Orogastric tube placement. EXAM:  ABDOMEN - 1 VIEW COMPARISON:  Radiograph 06/08/2023 FINDINGS: Tip of the enteric tube below the diaphragm in the stomach, the side port is just beyond the gastroesophageal junction. There is gaseous gastric distension. Dilated bowel in the right abdomen up 4.7 cm, potentially small bowel, but incompletely included in the field of view. IMPRESSION: 1. Tip of the enteric tube below the diaphragm in the stomach, side-port just beyond the gastroesophageal junction. 2. Gaseous gastric distension. Additional dilated bowel in the right abdomen likely small bowel, although incompletely included in the field of view. Electronically Signed   By: Narda Rutherford M.D.   On: 06/15/2023 15:06   DG CHEST PORT 1 VIEW Result Date: 06/15/2023 CLINICAL DATA:  10031 Cough 10031 EXAM: PORTABLE CHEST 1 VIEW COMPARISON:  June 08, 2023 FINDINGS: The cardiomediastinal silhouette is unchanged in contour.Unchanged elevation of the RIGHT hemidiaphragm. No pleural effusion. No pneumothorax. Bibasilar platelike opacities. Atherosclerotic calcifications. Similar background of interstitial prominence. IMPRESSION: Bibasilar platelike opacities, favored to reflect atelectasis. Electronically Signed   By: Meda Klinefelter M.D.   On: 06/15/2023 12:07   DG CHEST PORT 1 VIEW Result Date: 06/15/2023 CLINICAL DATA:  Respiratory failure, intubation and central line placement. EXAM: PORTABLE CHEST 1 VIEW COMPARISON:  Film earlier today at 0734 hours FINDINGS: Interval intubation with the endotracheal tube tip approximately 3.5 cm above the carina. Left jugular central line placement with the line tip in the upper SVC. No pneumothorax. Volume loss of the right lung remains with elevation of the right hemidiaphragm. Improved expansion of the left lung. Potential underlying mild pulmonary interstitial edema. No significant pleural effusions. IMPRESSION: 1. Interval intubation and left jugular central line placement. No pneumothorax. 2. Improved  expansion of the left lung. Persistent volume loss of the right lung with elevation of the right hemidiaphragm. Potential underlying mild pulmonary interstitial edema. Electronically Signed   By: Irish Lack M.D.   On: 06/15/2023 10:42   DG Swallowing Func-Speech Pathology Result Date: 06/12/2023 Table formatting from the original result  was not included. Modified Barium Swallow Study Patient Details Name: Allante Whitmire. MRN: [id number] Date of Birth: 1940-06-15 Today's Date: 06/12/2023 HPI/PMH: HPI: Pt is a 83 y.o. male who was admitted as a code stroke on 02/05 due to acute onset of right-sided weakness, right facial droop and aphasia. MRI from 02/06 displayed an evolving acute left MCA infarct, most pronounced at the left insula and left frontal operculum. Pt has intentionally lost 300 pounds in the recent past, with pre-loss weight at ~600Ib. PMHx includes hypertension, diabetes, CKD, prior CVA. Clinical Impression: Clinical Impression: Pt presents with a mild-moderate oropharyngeal dysphagia (DIGEST Score: 1) primarily characterized by silent penetration of thin liquid at level of VFs, flash penetration of nectar-thick above VFs, and diffuse pharyngeal residue. Pt's dysphagia severity level dropped from DIGEST score of 3 to 1, compared to previous MBS eval on 02/07.     Pharyngeal residue was present throughout all consistencies. Pt's posture for swallowing was consistently neutral. Pt follows commands for compensatory strategies more readily now with max multimodal cueing. Effective compensatory strategies include multiple swallows, throat clearing, and a straw when used with a verbally cued slow rate. Multiple consecutive sips of thin-liquid presented the most risk for aspiration due to silent penetration of Vfs. Unlike previous MBS study, pt tolerated solid consistency, displaying prolonged mastication but majority clearance of bolus after 2-3 swallows.     Recommendations include upgrading pt's  diet to dysphagia 2 (finely chopped) and thin liquids to optimize safety and efficiency for swallowing. SLP f/u to monitor tolerance of thin-liquids with compensatory strategies is highly recommended. Pt needs to remain upright for 30 minutes after PO intake due to remaining pharyngeal residue to optimize airway safety. Factors that may increase risk of adverse event in presence of aspiration [name] & Clearance [name] 2021): Factors that may increase risk of adverse event in presence of aspiration [name] & Clearance [name] 2021): Dependence for feeding and/or oral hygiene Recommendations/Plan: Swallowing Evaluation Recommendations Swallowing Evaluation Recommendations Recommendations: PO diet PO Diet Recommendation: Dysphagia 2 (Finely chopped); Thin liquids (Level 0) Liquid Administration via: Straw Medication Administration: Crushed with puree Supervision: Full supervision/cueing for swallowing strategies; Full assist for feeding Swallowing strategies  : Slow rate; Small bites/sips; Multiple dry swallows after each bite/sip; Clear throat intermittently Postural changes: Position pt fully upright for meals; Stay upright 30-60 min after meals Oral care recommendations: Oral care BID (2x/day); Staff/trained caregiver to provide oral care Treatment Plan Treatment Plan Follow-up recommendations: Follow physicians's recommendations for discharge plan and follow up therapies Functional status assessment: Patient has had a recent decline in their functional status and demonstrates the ability to make significant improvements in function in a reasonable and predictable amount of time. Recommendations Recommendations for follow up therapy are one component of a multi-disciplinary discharge planning process, led by the attending physician.  Recommendations may be updated based on patient status, additional functional criteria and insurance authorization. Assessment: Orofacial Exam: Orofacial Exam Oral Cavity: Oral Hygiene: WFL Oral Cavity -  Dentition: Adequate natural dentition Orofacial Anatomy: WFL Anatomy: Anatomy: WFL Boluses Administered: Boluses Administered Boluses Administered: Thin liquids (Level 0); Mildly thick liquids (Level 2, nectar thick); Moderately thick liquids (Level 3, honey thick); Puree; Solid  Oral Impairment Domain: Oral Impairment Domain Lip Closure: Interlabial escape, no progression to anterior lip Tongue control during bolus hold: Cohesive bolus between tongue to palatal seal Bolus preparation/mastication: Slow prolonged chewing/mashing with complete recollection Bolus transport/lingual motion: Brisk tongue motion Oral residue: Residue collection on oral structures Location of oral residue :  Tongue Initiation of pharyngeal swallow : Pyriform sinuses  Pharyngeal Impairment Domain: Pharyngeal Impairment Domain Soft palate elevation: No bolus between soft palate (SP)/pharyngeal wall (PW) Laryngeal elevation: Partial superior movement of thyroid cartilage/partial approximation of arytenoids to epiglottic petiole Anterior hyoid excursion: Complete anterior movement Epiglottic movement: Complete inversion Laryngeal vestibule closure: Incomplete, narrow column air/contrast in laryngeal vestibule Pharyngeal stripping wave : Present - complete Pharyngeal contraction (A/P view only): N/A Pharyngoesophageal segment opening: Complete distension and complete duration, no obstruction of flow Tongue base retraction: Trace column of contrast or air between tongue base and PPW Pharyngeal residue: Collection of residue within or on pharyngeal structures Location of pharyngeal residue: Diffuse (>3 areas)  Esophageal Impairment Domain: Esophageal Impairment Domain Esophageal clearance upright position: -- (Not tested) Pill: No data recorded Penetration/Aspiration Scale Score: Penetration/Aspiration Scale Score 1.  Material does not enter airway: Solid; Puree; Moderately thick liquids (Level 3, honey thick) 2.  Material enters airway, remains  ABOVE vocal cords then ejected out: Mildly thick liquids (Level 2, nectar thick) 5.  Material enters airway, CONTACTS cords and not ejected out: Thin liquids (Level 0) Compensatory Strategies: Compensatory Strategies Compensatory strategies: Yes Straw: Effective Effective Straw: Mildly thick liquid (Level 2, nectar thick) (Effective with slow rate applied.) Multiple swallows: Effective Effective Multiple Swallows: Thin liquid (Level 0); Mildly thick liquid (Level 2, nectar thick); Moderately thick liquid (Level 3, honey thick); Puree; Solid   General Information: Caregiver present: No  Diet Prior to this Study: Dysphagia 1 (pureed); Moderately thick liquids (Level 3, honey thick)   Temperature : Normal   Respiratory Status: WFL   Supplemental O2: None (Room air)   History of Recent Intubation: Yes  Behavior/Cognition: Alert; Cooperative; Pleasant mood; Requires cueing Self-Feeding Abilities: Needs assist with self-feeding Baseline vocal quality/speech: Normal Volitional Cough: Unable to elicit Volitional Swallow: Able to elicit Exam Limitations: No limitations Goal Planning: Prognosis for improved oropharyngeal function: Good Barriers to Reach Goals: Language deficits No data recorded No data recorded No data recorded Pain: Pain Assessment Pain Assessment: No/denies pain Pain Score: 0 End of Session: Start Time:SLP Start Time (ACUTE ONLY): 1310 Stop Time: SLP Stop Time (ACUTE ONLY): 1335 Time Calculation:SLP Time Calculation (min) (ACUTE ONLY): 25 min Charges: SLP Evaluations $ SLP Speech Visit: 1 Visit SLP Evaluations $MBS Swallow: 1 Procedure $Swallowing Treatment: 1 Procedure SLP visit diagnosis: SLP Visit Diagnosis: Dysphagia, oropharyngeal phase (R13.12) Past Medical History: Past Medical History: Diagnosis Date  Anemia of chronic disease   Ankle fracture 02/15/2016  Cancer (HCC)   Prostate  Chronic constipation   Chronic kidney disease   stage III  Diabetes mellitus without complication (HCC)   type II    Diabetic peripheral neuropathy (HCC)   Failure to thrive (0-17)   Fracture of left lower leg   Gout   Hyperlipidemia   Hypertension   Morbid obesity (HCC)   Unstable gait  Past Surgical History: Past Surgical History: Procedure Laterality Date  IR CT HEAD LTD  06/05/2023  IR INTRAVSC STENT CERV CAROTID W/O EMB-PROT MOD SED INC ANGIO  06/05/2023  IR PERCUTANEOUS ART THROMBECTOMY/INFUSION INTRACRANIAL INC DIAG ANGIO  06/05/2023  IR US GUIDE VASC ACCESS RIGHT  06/05/2023  ORIF ANKLE FRACTURE Left 02/29/2016  Procedure: OPEN REDUCTION INTERNAL FIXATION (ORIF) ANKLE FRACTURE;  Surgeon: Yolonda Kida, MD;  Location: [facility];  Service: Orthopedics;  Laterality: Left;  PROSTATE SURGERY    RADIOLOGY WITH ANESTHESIA N/A 06/05/2023  Procedure: IR WITH ANESTHESIA;  Surgeon: Radiologist, Medication, MD;  Location: [facility] OR;  Service: Radiology;  Laterality: N/A; Claudine Mouton 06/12/2023, 3:16 PM  DG Abd 1 View Result Date: 06/09/2023 CLINICAL DATA:  [id number]. Encounter for feeding tube placement. EXAM: ABDOMEN - 1 VIEW COMPARISON:  Abdomen film 06/05/2023. FINDINGS: Dobbhoff feeding tube is well placed with the radiopaque tip in the distal stomach. The visualized bowel pattern is nonobstructive. There is barium newly seen in the flexures and transverse colon. There is no supine evidence of free air. There is atelectasis in the lung bases. Cardiomegaly. IMPRESSION: 1. Dobbhoff feeding tube well placed with the radiopaque tip in the distal stomach. 2. Nonobstructive bowel gas pattern. 3. Cardiomegaly. Electronically Signed   By: Almira Bar M.D.   On: 06/09/2023 03:41   DG CHEST PORT 1 VIEW Result Date: 06/08/2023 CLINICAL DATA:  CHF EXAM: PORTABLE CHEST 1 VIEW COMPARISON:  01/06/2023 FINDINGS: Feeding tube in place. There is cardiomegaly with vascular congestion. Chronic elevation of the right hemidiaphragm with right base atelectasis. Interstitial prominence throughout the lungs, right greater than left could reflect  interstitial edema. Possible small effusions. No acute bony abnormality. IMPRESSION: Cardiomegaly with vascular congestion and probable mild interstitial edema. Question small bilateral effusions. Chronic elevation of the right hemidiaphragm with right base atelectasis. Electronically Signed   By: Charlett Nose M.D.   On: 06/08/2023 18:20   DG Swallowing Func-Speech Pathology Result Date: 06/07/2023 Table formatting from the original result was not included. Modified Barium Swallow Study Study completed and documented by [name], SLP Student Supervised and reviewed by Harlon Ditty, MA CCC-SLP [facility] Secure Chat Preferred Office [phone] Patient Details Name: Nathan Proctor. MRN: [id number] Date of Birth: 10/03/1940 Today's Date: 06/07/2023 HPI/PMH: HPI: Pt is a 83 y.o. male who was admitted as a code stroke on 02/05 due to acute onset of right-sided weakness, right facial droop and aphasia. MRI from 02/06 displayed an evolving acute left MCA infarct, most pronounced at the left insula and left frontal operculum. Pt has intentionally lost 300 pounds in the recent past, with pre-loss weight at ~600Ib. PMHx includes hypertension, diabetes, CKD, prior CVA. Clinical Impression: Clinical Impression: Pt presented with a moderate-severe oropharyngeal dysphagia (DIGEST Score: 3) primarily characterized by silent aspiration of thin liquid, silent penetration of nectar-thick liquid, diffuse pharyngeal residue collection during thin/nectar-thick consistencies, and posterior-escape of bolus across multiple consistencies. Pt positioning was a potential limitation for examination due to consistent posterior head position. More anterior head positioning during nectar-thick liquid displayed less pharyngeal residue, increased hyo-laryngeal excursion, and overall better airway safety. This positioning was not present throughout due to pt's receptive language deficits to understand cues.  Alternating between honey-thick and puree consistencies provided more oropharyngeal bolus clearance and no signs of aspiration. Recommend a dysphagia 1 (puree) and honey-thick liquid diet to optimize pt's airway safety and potential for meeting nutritional needs. Slow rate, small bites/sips, natural head posturing, and multiple swallows are compensatory strategies that displayed best efficiency with pt. F/u with SLP for family education, compensatory strategy training, and upgraded diet trials is recommended. Factors that may increase risk of adverse event in presence of aspiration [name] & Clearance [name] 2021): Factors that may increase risk of adverse event in presence of aspiration [name] & Clearance [name] 2021): Weak cough Recommendations/Plan: Swallowing Evaluation Recommendations Swallowing Evaluation Recommendations Recommendations: PO diet PO Diet Recommendation: Dysphagia 1 (Pureed); Moderately thick liquids (Level 3, honey thick) Liquid Administration via: Spoon Medication Administration: Crushed with puree Supervision: Full supervision/cueing for swallowing strategies; Full assist for feeding Swallowing strategies  : Slow rate;  Small bites/sips; Multiple dry swallows after each bite/sip (Pt needs to be in a neutral head position (no posterior head tilt).) Postural changes: Position pt fully upright for meals; Stay upright 30-60 min after meals Oral care recommendations: Oral care BID (2x/day); Staff/trained caregiver to provide oral care Caregiver Recommendations: Have oral suction available Treatment Plan Treatment Plan Treatment recommendations: Therapy as outlined in treatment plan below Follow-up recommendations: Follow physicians's recommendations for discharge plan and follow up therapies Functional status assessment: Patient has had a recent decline in their functional status and demonstrates the ability to make significant improvements in function in a reasonable and predictable amount of time. Treatment  frequency: Min 2x/week Treatment duration: 2 weeks Interventions: Aspiration precaution training; Compensatory techniques; Patient/family education; Diet toleration management by SLP; Trials of upgraded texture/liquids Recommendations Recommendations for follow up therapy are one component of a multi-disciplinary discharge planning process, led by the attending physician.  Recommendations may be updated based on patient status, additional functional criteria and insurance authorization. Assessment: Orofacial Exam: Orofacial Exam Oral Cavity - Dentition: Adequate natural dentition Orofacial Anatomy: WFL Anatomy: Anatomy: WFL Boluses Administered: Boluses Administered Boluses Administered: Thin liquids (Level 0); Mildly thick liquids (Level 2, nectar thick); Moderately thick liquids (Level 3, honey thick); Puree; Solid  Oral Impairment Domain: Oral Impairment Domain Lip Closure: Escape progressing to mid-chin Tongue control during bolus hold: Posterior escape of greater than half of bolus Bolus preparation/mastication: -- (Pt did not chew solid and SLP had to remove it from mouth.) Bolus transport/lingual motion: Repetitive/disorganized tongue motion Oral residue: Residue collection on oral structures Location of oral residue : Tongue; Palate Initiation of pharyngeal swallow : Pyriform sinuses  Pharyngeal Impairment Domain: Pharyngeal Impairment Domain Soft palate elevation: No bolus between soft palate (SP)/pharyngeal wall (PW) Laryngeal elevation: Partial superior movement of thyroid cartilage/partial approximation of arytenoids to epiglottic petiole Anterior hyoid excursion: Partial anterior movement Epiglottic movement: Partial inversion Laryngeal vestibule closure: Incomplete, narrow column air/contrast in laryngeal vestibule Pharyngeal stripping wave : Present - complete Pharyngeal contraction (A/P view only): N/A Pharyngoesophageal segment opening: Complete distension and complete duration, no obstruction of  flow Tongue base retraction: Narrow column of contrast or air between tongue base and PPW Pharyngeal residue: Collection of residue within or on pharyngeal structures Location of pharyngeal residue: Diffuse (>3 areas)  Esophageal Impairment Domain: Esophageal Impairment Domain Esophageal clearance upright position: -- (Not tested.) Pill: No data recorded Penetration/Aspiration Scale Score: Penetration/Aspiration Scale Score 1.  Material does not enter airway: Puree; Moderately thick liquids (Level 3, honey thick) 3.  Material enters airway, remains ABOVE vocal cords and not ejected out: Mildly thick liquids (Level 2, nectar thick) 8.  Material enters airway, passes BELOW cords without attempt by patient to eject out (silent aspiration) : Thin liquids (Level 0) Compensatory Strategies: Compensatory Strategies Compensatory strategies: Yes Straw: Ineffective Multiple swallows: Effective (Required max cueing due to pt's language.) Effective Multiple Swallows: Puree; Moderately thick liquid (Level 3, honey thick); Mildly thick liquid (Level 2, nectar thick)   General Information: Caregiver present: No  Diet Prior to this Study: NPO   Temperature : Normal   Respiratory Status: WFL   Supplemental O2: None (Room air)   No data recorded Behavior/Cognition: Alert; Cooperative; Pleasant mood; Requires cueing Self-Feeding Abilities: Needs assist with self-feeding Baseline vocal quality/speech: Dysphonic Volitional Cough: Unable to elicit Volitional Swallow: Able to elicit Exam Limitations: Poor positioning Goal Planning: Prognosis for improved oropharyngeal function: Good Barriers to Reach Goals: Language deficits No data recorded No data recorded Consulted and  agree with results and recommendations: Pt unable/family or caregiver not available Pain: Pain Assessment Pain Assessment: No/denies pain Faces Pain Scale: 0 Facial Expression: 0 Body Movements: 0 Muscle Tension: 0 Compliance with ventilator (intubated pts.): N/A  Vocalization (extubated pts.): 0 CPOT Total: 0 End of Session: Start Time:SLP Start Time (ACUTE ONLY): 0903 Stop Time: SLP Stop Time (ACUTE ONLY): 0930 Time Calculation:SLP Time Calculation (min) (ACUTE ONLY): 27 min Charges: SLP Evaluations $ SLP Speech Visit: 1 Visit SLP Evaluations $BSS Swallow: 1 Procedure $MBS Swallow: 1 Procedure $ SLP EVAL LANGUAGE/SOUND PRODUCTION: 1 Procedure $Swallowing Treatment: 1 Procedure SLP visit diagnosis: SLP Visit Diagnosis: Dysphagia, oropharyngeal phase (R13.12) Past Medical History: Past Medical History: Diagnosis Date  Anemia of chronic disease   Ankle fracture 02/15/2016  Cancer (HCC)   Prostate  Chronic constipation   Chronic kidney disease   stage III  Diabetes mellitus without complication (HCC)   type II   Diabetic peripheral neuropathy (HCC)   Failure to thrive (0-17)   Fracture of left lower leg   Gout   Hyperlipidemia   Hypertension   Morbid obesity (HCC)   Unstable gait  Past Surgical History: Past Surgical History: Procedure Laterality Date  IR CT HEAD LTD  06/05/2023  IR INTRAVSC STENT CERV CAROTID W/O EMB-PROT MOD SED INC ANGIO  06/05/2023  IR PERCUTANEOUS ART THROMBECTOMY/INFUSION INTRACRANIAL INC DIAG ANGIO  06/05/2023  IR US GUIDE VASC ACCESS RIGHT  06/05/2023  ORIF ANKLE FRACTURE Left 02/29/2016  Procedure: OPEN REDUCTION INTERNAL FIXATION (ORIF) ANKLE FRACTURE;  Surgeon: Yolonda Kida, MD;  Location: [facility];  Service: Orthopedics;  Laterality: Left;  PROSTATE SURGERY    RADIOLOGY WITH ANESTHESIA N/A 06/05/2023  Procedure: IR WITH ANESTHESIA;  Surgeon: Radiologist, Medication, MD;  Location: [facility];  Service: Radiology;  Laterality: N/A; DeBlois, Riley Nearing 06/07/2023, 12:15 PM  CT HEAD WO CONTRAST ( ) Result Date: 06/07/2023 CLINICAL DATA:  83 year old male status post code stroke presentation, left MCA M2 occlusion, with left MCA infarct with confluent petechial hemorrhage. EXAM: CT HEAD WITHOUT CONTRAST TECHNIQUE: Contiguous axial images were obtained from  the base of the skull through the vertex without intravenous contrast. RADIATION DOSE REDUCTION: This exam was performed according to the departmental dose-optimization program which includes automated exposure control, adjustment of the mA and/or kV according to patient size and/or use of iterative reconstruction technique. COMPARISON:  Brain MRI yesterday.  Head CT 06/05/2023. FINDINGS: Brain: Confluent mixed density infarct in the left MCA middle division, epicenter at the insula and operculum. Confluent petechial hemorrhage on series 3, image 16 appears stable from the MRI. Superimposed trace subarachnoid blood is difficult to exclude by CT (series 3, image 13), but was not apparent on MRI. Stable mild mass effect on the left lateral ventricle with only trace rightward midline shift (series 3, image 16). No ventriculomegaly. Elsewhere gray-white differentiation is stable from the presentation CT. Basilar cisterns remain patent. Vascular: Resolved hyperdense left MCA since presentation. Calcified atherosclerosis at the skull base. Skull: Stable, intact. Sinuses/Orbits: Visualized paranasal sinuses and mastoids are stable and well aerated. Other: Left nasoenteric tube now in place. Stable orbit and scalp soft tissues. IMPRESSION: 1. Stable by CT confluent Left MCA middle division infarct with petechial hemorrhage (Heidelberg classification 1b: HI2, confluent petechiae, no mass effect). Questionable trace superimposed SAH, but was not apparent on MRI. 2. Stable minimal intracranial mass effect. 3. No new intracranial abnormality. Electronically Signed   By: Odessa Fleming M.D.   On: 06/07/2023 05:37   MR BRAIN WO  CONTRAST Result Date: 06/06/2023 CLINICAL DATA:  Follow-up examination for stroke. EXAM: MRI HEAD WITHOUT CONTRAST TECHNIQUE: Multiplanar, multiecho pulse sequences of the brain and surrounding structures were obtained without intravenous contrast. COMPARISON:  Comparison made to multiple previous exams from  06/05/2023. FINDINGS: Brain: Examination degraded by motion artifact. Cerebral volume within normal limits for age. Patchy T2/FLAIR hyperintensity involving the periventricular deep white matter both cerebral hemispheres, consistent with chronic small vessel ischemic disease, mild in nature. Small remote infarct noted within the right cerebellum. Confluent restricted diffusion involving the left insula and left frontal operculum, consistent with evolving acute left MCA distribution infarct. Area of infarction measures up to approximately 6 cm in AP diameter. Prominent associated susceptibility artifact, consistent with petechial hemorrhage (series 7, image 61) ( Heidelberg classification 1b: HI2, confluent petechiae, no mass effect. No frank or organized hematoma evident by MRI. No significant regional mass effect. Few additional small foci of infarction noted within the left parieto-occipital region posteriorly (series 3, images 40, 29). Apparent diffusion signal along the right frontal parafalcine region felt to be consistent with artifact related to dural calcification. Note made of a few additional chronic micro hemorrhages within the left cerebellum and right thalamus. No mass lesion or midline shift. Ventricles normal size without hydrocephalus. No extra-axial fluid collection. Pituitary gland suprasellar region within normal limits. Vascular: Major intracranial vascular flow voids are maintained. Skull and upper cervical spine: Craniocervical junction within normal limits. Bone marrow signal intensity overall within normal limits. No scalp soft tissue abnormality. Sinuses/Orbits: Prior bilateral ocular lens replacement. Paranasal sinuses are clear. No mastoid effusion. Other: None. IMPRESSION: 1. Evolving acute left MCA distribution infarct, most pronounced at the left insula and left frontal operculum. Prominent associated petechial hemorrhage without frank intraparenchymal hematoma (Heidelberg  classification 1b: HI2, confluent petechiae, no mass effect). 2. Few additional small foci of acute infarction within the left parieto-occipital region posteriorly. 3. Underlying mild chronic microvascular ischemic disease with small remote right cerebellar infarct. Electronically Signed   By: Rise Mu M.D.   On: 06/06/2023 03:12   ECHOCARDIOGRAM COMPLETE Result Date: 06/05/2023    ECHOCARDIOGRAM REPORT   Patient Name:   Nathan Proctor. Date of Exam: 06/05/2023 Medical Rec #:  [id number]              Height:       70.0 in Accession #:    [id number]             Weight:       292.3 lb Date of Birth:  December 12, 1940              BSA:          2.453 m Patient Age:    82 years               BP:           129/72 mmHg Patient Gender: M                      HR:           89 bpm. Exam Location:  Inpatient Procedure: 2D Echo, Color Doppler, Cardiac Doppler and Intracardiac            Opacification Agent Indications:    Stroke I63.9  History:        Patient has prior history of Echocardiogram examinations, most                 recent 10/06/2019. Risk  Factors:Diabetes, Hypertension and                 Dyslipidemia.  Sonographer:    Harriette Bouillon RDCS Referring Phys: Lynnae January IMPRESSIONS  1. Left ventricular ejection fraction, by estimation, is 30%. The left ventricle has moderately decreased function. The left ventricle demonstrates global hypokinesis. There is mild left ventricular hypertrophy.  2. Right ventricular systolic function is normal. The right ventricular size is normal.  3. Trivial mitral valve regurgitation.  4. The aortic valve is tricuspid. Aortic valve regurgitation is not visualized.  5. The inferior vena cava is normal in size with greater than 50% respiratory variability, suggesting right atrial pressure of 3 mmHg. Comparison(s): The left ventricular function is worsened. FINDINGS  Left Ventricle: Left ventricular ejection fraction, by estimation, is 30%. The left ventricle has moderately  decreased function. The left ventricle demonstrates global hypokinesis. Definity contrast agent was given IV to delineate the left ventricular endocardial borders. The left ventricular internal cavity size was normal in size. There is mild left ventricular hypertrophy. Right Ventricle: The right ventricular size is normal. Right vetricular wall thickness was not assessed. Right ventricular systolic function is normal. Left Atrium: Left atrial size was normal in size. Right Atrium: Right atrial size was normal in size. Pericardium: There is no evidence of pericardial effusion. Mitral Valve: There is mild thickening of the mitral valve leaflet(s). Mild to moderate mitral annular calcification. Trivial mitral valve regurgitation. Tricuspid Valve: The tricuspid valve is normal in structure. Tricuspid valve regurgitation is trivial. Aortic Valve: The aortic valve is tricuspid. Aortic valve regurgitation is not visualized. Pulmonic Valve: The pulmonic valve was normal in structure. Pulmonic valve regurgitation is not visualized. Aorta: The aortic root and ascending aorta are structurally normal, with no evidence of dilitation. Venous: The inferior vena cava is normal in size with greater than 50% respiratory variability, suggesting right atrial pressure of 3 mmHg. IAS/Shunts: The interatrial septum was not assessed.  LEFT VENTRICLE PLAX 2D LVIDd:         5.00 cm   Diastology LVIDs:         4.40 cm   LV e' lateral: 5.33 cm/s LV PW:         1.20 cm LV IVS:        1.10 cm LVOT diam:     2.30 cm LV SV:         55 LV SV Index:   23 LVOT Area:     4.15 cm  RIGHT VENTRICLE RV S prime:     14.40 cm/s LEFT ATRIUM         Index LA diam:    4.60 cm 1.88 cm/m  AORTIC VALVE LVOT Vmax:   65.50 cm/s LVOT Vmean:  45.200 cm/s LVOT VTI:    0.133 m  AORTA Ao Root diam: 3.00 cm Ao Asc diam:  3.50 cm  SHUNTS Systemic VTI:  0.13 m Systemic Diam: 2.30 cm Dietrich Pates MD Electronically signed by Dietrich Pates MD Signature Date/Time:  06/05/2023/9:33:38 PM    Final    DG Abd Portable 1V Result Date: 06/05/2023 CLINICAL DATA:  Feeding tube placement. EXAM: PORTABLE ABDOMEN - 1 VIEW COMPARISON:  None Available. FINDINGS: Distal tip of feeding tube is seen in expected position of distal stomach. IMPRESSION: Distal tip of feeding tube seen in expected position of distal stomach. Electronically Signed   By: Lupita Raider M.D.   On: 06/05/2023 15:49   CT HEAD WO CONTRAST Result Date: 06/05/2023  CLINICAL DATA:  Stroke, follow-up. Status post intracranial mechanical thrombectomy and stenting of the left ICA bifurcation. EXAM: CT HEAD WITHOUT CONTRAST TECHNIQUE: Contiguous axial images were obtained from the base of the skull through the vertex without intravenous contrast. RADIATION DOSE REDUCTION: This exam was performed according to the departmental dose-optimization program which includes automated exposure control, adjustment of the mA and/or kV according to patient size and/or use of iterative reconstruction technique. COMPARISON:  CT head without contrast and CT angio head and neck 06/05/2023. By plain CT 06/05/2023. FINDINGS: Brain: The study is mildly degraded by patient motion. The left insular and left opercular infarct is somewhat obscured by patient motion. The cortex is slightly hyperdense, potentially reflecting reperfusion. No hemorrhage is present. Basal ganglia are intact. Subcortical white matter hypoattenuation in the high left frontal lobe is new. Mild right-sided white matter disease is stable. Basal ganglia are intact. A remote lacunar infarct is again noted in the right cerebellum. The brainstem and cerebellum are otherwise within normal limits. Vascular: Atherosclerotic calcifications are present within the cavernous internal carotid arteries and at the normal origin of both vertebral arteries. No hyperdense vessel is present. Skull: Calvarium is intact. No focal lytic or blastic lesions are present. No significant  extracranial soft tissue lesion is present. Sinuses/Orbits: The paranasal sinuses and mastoid air cells are clear. Bilateral lens replacements are noted. Globes and orbits are otherwise unremarkable. IMPRESSION: 1. The left insular and left opercular infarct is somewhat obscured by patient motion. 2. The cortex is slightly hyperdense, potentially reflecting reperfusion. 3. No hemorrhage. 4. Subcortical white matter hypoattenuation in the high left frontal lobe is new. This may reflect ischemic changes or edema related to the reperfusion. 5. Remote lacunar infarct of the right cerebellum. 6. Stable mild right-sided white matter disease. This likely reflects the sequela of chronic microvascular ischemia. Electronically Signed   By: Marin Roberts M.D.   On: 06/05/2023 14:38   IR PERCUTANEOUS ART THROMBECTOMY/INFUSION INTRACRANIAL INC DIAG ANGIO Result Date: 06/05/2023 INDICATION: 83 year old male presenting with right-sided weakness and aphasia; NIHSS 28. His last known well was 10 p.m. on 06/04/2023. His past medical history significant for prior stroke, hypertension, diabetes and chronic kidney disease; baseline modified Rankin scale 0. Head CT showed hypodensity within the left insula, basal ganglia and left frontal operculum (ASPECTS 7). No IV thrombolytic given as patient was outside the window. CT angiogram of the head and neck showed an occlusion of a left M2/MCA anterior division branch. CT perfusion showed a 41 mL core infarct with a 45 mL ischemic penumbra. She was transferred to our service for mechanical thrombectomy. EXAM: ULTRASOUND-GUIDED VASCULAR ACCESS DIAGNOSTIC CEREBRAL ANGIOGRAM MECHANICAL THROMBECTOMY FLAT PANEL HEAD CT LEFT CAROTID STENTING AND ANGIOPLASTY WITHOUT CEREBRAL PROTECTION DEVICE COMPARISON:  CT/CT angiogram of the head and neck June 05, 2023. MEDICATIONS: No antibiotics administered. ANESTHESIA/SEDATION: The procedure was performed under general anesthesia. CONTRAST:  80 mL  of Omnipaque 300 milligram/mL FLUOROSCOPY: Radiation Exposure Index (as provided by the fluoroscopic device): 1315 mGy Kerma COMPLICATIONS: None immediate. TECHNIQUE: Informed written consent was obtained from the patient's wife after a thorough discussion of the procedural risks, benefits and alternatives. All questions were addressed. Maximal Sterile Barrier Technique was utilized including caps, mask, sterile gowns, sterile gloves, sterile drape, hand hygiene and skin antiseptic. A timeout was performed prior to the initiation of the procedure. The right groin was prepped and draped in the usual sterile fashion. Using a micropuncture kit and the modified Seldinger technique, access was gained to the  right common femoral artery and an 8 French sheath was placed. Real-time ultrasound guidance was utilized for vascular access including the acquisition of a permanent ultrasound image documenting patency of the accessed vessel. Under fluoroscopy, an 8 [place] Walrus balloon guide catheter was navigated over a 6 [place] VTK catheter and a 0.035" Terumo Glidewire into the aortic arch. The catheter was placed into the left common carotid artery and then advanced into the left internal carotid artery. The diagnostic catheter was removed. Frontal and lateral angiograms of the head were obtained. FINDINGS: 1. Ultrasound showed heavily calcified right common femoral artery is maintained patency and caliber. 2. Proximal occlusion of a left M2/MCA anterior division branch. 3. Atherosclerotic changes of the intracranial left ICA with mild stenosis at the distal cavernous segment. 4. 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 PROCEDURE: Using biplane roadmap guidance, a Red 62 aspiration catheter was navigated over Colossus 35 microguidewire into the cavernous segment of the left ICA. The aspiration catheter was then advanced  to the level of occlusion and connected to an aspiration pump. Continuous aspiration was performed for 2 minutes. The guide catheter was connected to a VacLok syringe and the guiding catheter balloon was inflated. The aspiration catheter was subsequently removed under constant aspiration. The guide catheter was aspirated for debris. Left internal carotid artery angiograms with frontal and lateral views of the head showed complete recanalization of the left MCA vascular tree. The guide catheter was retracted into the neck. Frontal and lateral angiograms of the neck were obtained. Improvement of the degree of stenosis in the carotid bulb compared to prior CT angiogram. There is prominent luminal irregularity at the carotid bulb residual moderate stenosis. Increased tortuosity of the proximal/mid cervical left ICA with kinking. Left internal carotid artery angiograms with left anterior oblique views of the neck showed evidence of filling defects at the level of the carotid bulb. Flat panel CT of the head was obtained and post processed in a separate workstation with concurrent attending physician supervision. Selected images were sent to PACS. No evidence of hemorrhagic complication. There is mild contrast staining of the left insular and frontal cortex. Repeat left internal carotid artery angiograms with frontal and lateral views of the head showed Amy seen left M3/MCA branch to the left parietal region. Left common carotid artery angiograms with frontal and lateral views of the neck showed vertebra Gretchen of stenosis at the left ICA bulb with more prominent filling defect. At this point, patient was loaded on cangrelor followed by continuous drip. Using biplane roadmap guidance, a 4-7 mm Emboshield NAV6 cerebral protection device was advanced into the cervical left ICA. However, multiple attempts to advance a cerebral protection device through the left ICA kinking proved unsuccessful. The cerebral protection device  was subsequently removed. Using biplane roadmap guidance, a 10-8 x 40 mm XACT carotid stent was navigated and deployed from the distal left common carotid artery to the proximal left internal carotid artery, proximal to the vessel kinking. Suboptimal stent expansion was noted. Then, a 6 x 30 mm Viatrac balloon was navigated into the recently deployed stent. Angioplasty was performed under fluoroscopy. Left internal carotid artery angiograms with frontal and lateral views of the neck showed adequate stent positioning and expansion with brisk anterograde flow. Left internal carotid artery angiograms with frontal and lateral views of the head showed improvement of anterograde flow in the left MCA vascular tree with brisk anterograde  flow. Delayed left common carotid artery angiograms with frontal and lateral views of the neck showed no evidence of clot formation within the stent. The catheter was subsequently Riddle. Right common femoral artery angiogram was obtained in right anterior oblique view. The puncture is at the level of the common femoral artery. The artery has normal caliber, adequate for closure device. The sheath was exchanged over the wire for an 8 [place] Angio-Seal which was utilized for access closure. Immediate hemostasis was achieved. IMPRESSION: 1. Successful mechanical thrombectomy for treatment of a proximal left M2/MCA anterior division branch occlusion achieving complete recanalization (TICI 3). 2. 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 PLAN: Continue cangrelor infusion until patient is transitioned to oral dual antiplatelet therapy. Electronically Signed   By: Baldemar Lenis M.D.   On: 06/05/2023 13:16   IR US Guide Vasc Access Right Result Date: 06/05/2023 INDICATION: 83 year old male presenting with right-sided weakness and  aphasia; NIHSS 28. His last known well was 10 p.m. on 06/04/2023. His past medical history significant for prior stroke, hypertension, diabetes and chronic kidney disease; baseline modified Rankin scale 0. Head CT showed hypodensity within the left insula, basal ganglia and left frontal operculum (ASPECTS 7). No IV thrombolytic given as patient was outside the window. CT angiogram of the head and neck showed an occlusion of a left M2/MCA anterior division branch. CT perfusion showed a 41 mL core infarct with a 45 mL ischemic penumbra. She was transferred to our service for mechanical thrombectomy. EXAM: ULTRASOUND-GUIDED VASCULAR ACCESS DIAGNOSTIC CEREBRAL ANGIOGRAM MECHANICAL THROMBECTOMY FLAT PANEL HEAD CT LEFT CAROTID STENTING AND ANGIOPLASTY WITHOUT CEREBRAL PROTECTION DEVICE COMPARISON:  CT/CT angiogram of the head and neck June 05, 2023. MEDICATIONS: No antibiotics administered. ANESTHESIA/SEDATION: The procedure was performed under general anesthesia. CONTRAST:  80 mL of Omnipaque 300 milligram/mL FLUOROSCOPY: Radiation Exposure Index (as provided by the fluoroscopic device): 1315 mGy Kerma COMPLICATIONS: None immediate. TECHNIQUE: Informed written consent was obtained from the patient's wife after a thorough discussion of the procedural risks, benefits and alternatives. All questions were addressed. Maximal Sterile Barrier Technique was utilized including caps, mask, sterile gowns, sterile gloves, sterile drape, hand hygiene and skin antiseptic. A timeout was performed prior to the initiation of the procedure. The right groin was prepped and draped in the usual sterile fashion. Using a micropuncture kit and the modified Seldinger technique, access was gained to the right common femoral artery and an 8 French sheath was placed. Real-time ultrasound guidance was utilized for vascular access including the acquisition of a permanent ultrasound image documenting patency of the accessed vessel. Under  fluoroscopy, an 8 [place] Walrus balloon guide catheter was navigated over a 6 [place] VTK catheter and a 0.035" Terumo Glidewire into the aortic arch. The catheter was placed into the left common carotid artery and then advanced into the left internal carotid artery. The diagnostic catheter was removed. Frontal and lateral angiograms of the head were obtained. FINDINGS: 1. Ultrasound showed heavily calcified right common femoral artery is maintained patency and caliber. 2. Proximal occlusion of a left M2/MCA anterior division branch. 3. Atherosclerotic changes of the intracranial left ICA with mild stenosis at the distal cavernous segment. 4. 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 PROCEDURE: Using biplane roadmap guidance, a Red 62 aspiration catheter was navigated over Colossus  35 microguidewire into the cavernous segment of the left ICA. The aspiration catheter was then advanced to the level of occlusion and connected to an aspiration pump. Continuous aspiration was performed for 2 minutes. The guide catheter was connected to a VacLok syringe and the guiding catheter balloon was inflated. The aspiration catheter was subsequently removed under constant aspiration. The guide catheter was aspirated for debris. Left internal carotid artery angiograms with frontal and lateral views of the head showed complete recanalization of the left MCA vascular tree. The guide catheter was retracted into the neck. Frontal and lateral angiograms of the neck were obtained. Improvement of the degree of stenosis in the carotid bulb compared to prior CT angiogram. There is prominent luminal irregularity at the carotid bulb residual moderate stenosis. Increased tortuosity of the proximal/mid cervical left ICA with kinking. Left internal carotid artery angiograms with left anterior oblique views of the neck showed evidence of  filling defects at the level of the carotid bulb. Flat panel CT of the head was obtained and post processed in a separate workstation with concurrent attending physician supervision. Selected images were sent to PACS. No evidence of hemorrhagic complication. There is mild contrast staining of the left insular and frontal cortex. Repeat left internal carotid artery angiograms with frontal and lateral views of the head showed Amy seen left M3/MCA branch to the left parietal region. Left common carotid artery angiograms with frontal and lateral views of the neck showed vertebra Gretchen of stenosis at the left ICA bulb with more prominent filling defect. At this point, patient was loaded on cangrelor followed by continuous drip. Using biplane roadmap guidance, a 4-7 mm Emboshield NAV6 cerebral protection device was advanced into the cervical left ICA. However, multiple attempts to advance a cerebral protection device through the left ICA kinking proved unsuccessful. The cerebral protection device was subsequently removed. Using biplane roadmap guidance, a 10-8 x 40 mm XACT carotid stent was navigated and deployed from the distal left common carotid artery to the proximal left internal carotid artery, proximal to the vessel kinking. Suboptimal stent expansion was noted. Then, a 6 x 30 mm Viatrac balloon was navigated into the recently deployed stent. Angioplasty was performed under fluoroscopy. Left internal carotid artery angiograms with frontal and lateral views of the neck showed adequate stent positioning and expansion with brisk anterograde flow. Left internal carotid artery angiograms with frontal and lateral views of the head showed improvement of anterograde flow in the left MCA vascular tree with brisk anterograde flow. Delayed left common carotid artery angiograms with frontal and lateral views of the neck showed no evidence of clot formation within the stent. The catheter was subsequently Riddle. Right common  femoral artery angiogram was obtained in right anterior oblique view. The puncture is at the level of the common femoral artery. The artery has normal caliber, adequate for closure device. The sheath was exchanged over the wire for an 8 [place] Angio-Seal which was utilized for access closure. Immediate hemostasis was achieved. IMPRESSION: 1. Successful mechanical thrombectomy for treatment of a proximal left M2/MCA anterior division branch occlusion achieving complete recanalization (TICI 3). 2. 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 PLAN: Continue cangrelor infusion until patient is transitioned to oral dual antiplatelet therapy. Electronically Signed   By: Baldemar Lenis M.D.   On: 06/05/2023 13:16   IR INTRAVSC STENT CERV CAROTID W/O EMB-PROT MOD SED Result  Date: 06/05/2023 INDICATION: 83 year old male presenting with right-sided weakness and aphasia; NIHSS 28. His last known well was 10 p.m. on 06/04/2023. His past medical history significant for prior stroke, hypertension, diabetes and chronic kidney disease; baseline modified Rankin scale 0. Head CT showed hypodensity within the left insula, basal ganglia and left frontal operculum (ASPECTS 7). No IV thrombolytic given as patient was outside the window. CT angiogram of the head and neck showed an occlusion of a left M2/MCA anterior division branch. CT perfusion showed a 41 mL core infarct with a 45 mL ischemic penumbra. She was transferred to our service for mechanical thrombectomy. EXAM: ULTRASOUND-GUIDED VASCULAR ACCESS DIAGNOSTIC CEREBRAL ANGIOGRAM MECHANICAL THROMBECTOMY FLAT PANEL HEAD CT LEFT CAROTID STENTING AND ANGIOPLASTY WITHOUT CEREBRAL PROTECTION DEVICE COMPARISON:  CT/CT angiogram of the head and neck June 05, 2023. MEDICATIONS: No antibiotics administered. ANESTHESIA/SEDATION: The  procedure was performed under general anesthesia. CONTRAST:  80 mL of Omnipaque 300 milligram/mL FLUOROSCOPY: Radiation Exposure Index (as provided by the fluoroscopic device): 1315 mGy Kerma COMPLICATIONS: None immediate. TECHNIQUE: Informed written consent was obtained from the patient's wife after a thorough discussion of the procedural risks, benefits and alternatives. All questions were addressed. Maximal Sterile Barrier Technique was utilized including caps, mask, sterile gowns, sterile gloves, sterile drape, hand hygiene and skin antiseptic. A timeout was performed prior to the initiation of the procedure. The right groin was prepped and draped in the usual sterile fashion. Using a micropuncture kit and the modified Seldinger technique, access was gained to the right common femoral artery and an 8 French sheath was placed. Real-time ultrasound guidance was utilized for vascular access including the acquisition of a permanent ultrasound image documenting patency of the accessed vessel. Under fluoroscopy, an 8 [place] Walrus balloon guide catheter was navigated over a 6 [place] VTK catheter and a 0.035" Terumo Glidewire into the aortic arch. The catheter was placed into the left common carotid artery and then advanced into the left internal carotid artery. The diagnostic catheter was removed. Frontal and lateral angiograms of the head were obtained. FINDINGS: 1. Ultrasound showed heavily calcified right common femoral artery is maintained patency and caliber. 2. Proximal occlusion of a left M2/MCA anterior division branch. 3. Atherosclerotic changes of the intracranial left ICA with mild stenosis at the distal cavernous segment. 4. 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 PROCEDURE: Using biplane roadmap guidance, a Red 62 aspiration catheter was navigated over Colossus 35 microguidewire into the cavernous  segment of the left ICA. The aspiration catheter was then advanced to the level of occlusion and connected to an aspiration pump. Continuous aspiration was performed for 2 minutes. The guide catheter was connected to a VacLok syringe and the guiding catheter balloon was inflated. The aspiration catheter was subsequently removed under constant aspiration. The guide catheter was aspirated for debris. Left internal carotid artery angiograms with frontal and lateral views of the head showed complete recanalization of the left MCA vascular tree. The guide catheter was retracted into the neck. Frontal and lateral angiograms of the neck were obtained. Improvement of the degree of stenosis in the carotid bulb compared to prior CT angiogram. There is prominent luminal irregularity at the carotid bulb residual moderate stenosis. Increased tortuosity of the proximal/mid cervical left ICA with kinking. Left internal carotid artery angiograms with left anterior oblique views of the neck showed evidence of filling defects at the level of the carotid bulb. Flat  panel CT of the head was obtained and post processed in a separate workstation with concurrent attending physician supervision. Selected images were sent to PACS. No evidence of hemorrhagic complication. There is mild contrast staining of the left insular and frontal cortex. Repeat left internal carotid artery angiograms with frontal and lateral views of the head showed Amy seen left M3/MCA branch to the left parietal region. Left common carotid artery angiograms with frontal and lateral views of the neck showed vertebra Gretchen of stenosis at the left ICA bulb with more prominent filling defect. At this point, patient was loaded on cangrelor followed by continuous drip. Using biplane roadmap guidance, a 4-7 mm Emboshield NAV6 cerebral protection device was advanced into the cervical left ICA. However, multiple attempts to advance a cerebral protection device through the  left ICA kinking proved unsuccessful. The cerebral protection device was subsequently removed. Using biplane roadmap guidance, a 10-8 x 40 mm XACT carotid stent was navigated and deployed from the distal left common carotid artery to the proximal left internal carotid artery, proximal to the vessel kinking. Suboptimal stent expansion was noted. Then, a 6 x 30 mm Viatrac balloon was navigated into the recently deployed stent. Angioplasty was performed under fluoroscopy. Left internal carotid artery angiograms with frontal and lateral views of the neck showed adequate stent positioning and expansion with brisk anterograde flow. Left internal carotid artery angiograms with frontal and lateral views of the head showed improvement of anterograde flow in the left MCA vascular tree with brisk anterograde flow. Delayed left common carotid artery angiograms with frontal and lateral views of the neck showed no evidence of clot formation within the stent. The catheter was subsequently Riddle. Right common femoral artery angiogram was obtained in right anterior oblique view. The puncture is at the level of the common femoral artery. The artery has normal caliber, adequate for closure device. The sheath was exchanged over the wire for an 8 [place] Angio-Seal which was utilized for access closure. Immediate hemostasis was achieved. IMPRESSION: 1. Successful mechanical thrombectomy for treatment of a proximal left M2/MCA anterior division branch occlusion achieving complete recanalization (TICI 3). 2. 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 PLAN: Continue cangrelor infusion until patient is transitioned to oral dual antiplatelet therapy. Electronically Signed   By: Baldemar Lenis M.D.   On: 06/05/2023 13:16   IR CT Head Ltd Result Date: 06/05/2023 INDICATION:  83 year old male presenting with right-sided weakness and aphasia; NIHSS 28. His last known well was 10 p.m. on 06/04/2023. His past medical history significant for prior stroke, hypertension, diabetes and chronic kidney disease; baseline modified Rankin scale 0. Head CT showed hypodensity within the left insula, basal ganglia and left frontal operculum (ASPECTS 7). No IV thrombolytic given as patient was outside the window. CT angiogram of the head and neck showed an occlusion of a left M2/MCA anterior division branch. CT perfusion showed a 41 mL core infarct with a 45 mL ischemic penumbra. She was transferred to our service for mechanical thrombectomy. EXAM: ULTRASOUND-GUIDED VASCULAR ACCESS DIAGNOSTIC CEREBRAL ANGIOGRAM MECHANICAL THROMBECTOMY FLAT PANEL HEAD CT LEFT CAROTID STENTING AND ANGIOPLASTY WITHOUT CEREBRAL PROTECTION DEVICE COMPARISON:  CT/CT angiogram of the head and neck June 05, 2023. MEDICATIONS: No antibiotics administered. ANESTHESIA/SEDATION: The procedure was performed under general anesthesia. CONTRAST:  80 mL of Omnipaque 300 milligram/mL FLUOROSCOPY: Radiation Exposure Index (as provided by the fluoroscopic device): 1315 mGy  Kerma COMPLICATIONS: None immediate. TECHNIQUE: Informed written consent was obtained from the patient's wife after a thorough discussion of the procedural risks, benefits and alternatives. All questions were addressed. Maximal Sterile Barrier Technique was utilized including caps, mask, sterile gowns, sterile gloves, sterile drape, hand hygiene and skin antiseptic. A timeout was performed prior to the initiation of the procedure. The right groin was prepped and draped in the usual sterile fashion. Using a micropuncture kit and the modified Seldinger technique, access was gained to the right common femoral artery and an 8 French sheath was placed. Real-time ultrasound guidance was utilized for vascular access including the acquisition of a permanent ultrasound image  documenting patency of the accessed vessel. Under fluoroscopy, an 8 [place] Walrus balloon guide catheter was navigated over a 6 [place] VTK catheter and a 0.035" Terumo Glidewire into the aortic arch. The catheter was placed into the left common carotid artery and then advanced into the left internal carotid artery. The diagnostic catheter was removed. Frontal and lateral angiograms of the head were obtained. FINDINGS: 1. Ultrasound showed heavily calcified right common femoral artery is maintained patency and caliber. 2. Proximal occlusion of a left M2/MCA anterior division branch. 3. Atherosclerotic changes of the intracranial left ICA with mild stenosis at the distal cavernous segment. 4. 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 PROCEDURE: Using biplane roadmap guidance, a Red 62 aspiration catheter was navigated over Colossus 35 microguidewire into the cavernous segment of the left ICA. The aspiration catheter was then advanced to the level of occlusion and connected to an aspiration pump. Continuous aspiration was performed for 2 minutes. The guide catheter was connected to a VacLok syringe and the guiding catheter balloon was inflated. The aspiration catheter was subsequently removed under constant aspiration. The guide catheter was aspirated for debris. Left internal carotid artery angiograms with frontal and lateral views of the head showed complete recanalization of the left MCA vascular tree. The guide catheter was retracted into the neck. Frontal and lateral angiograms of the neck were obtained. Improvement of the degree of stenosis in the carotid bulb compared to prior CT angiogram. There is prominent luminal irregularity at the carotid bulb residual moderate stenosis. Increased tortuosity of the proximal/mid cervical left ICA with kinking. Left internal carotid artery angiograms with left anterior  oblique views of the neck showed evidence of filling defects at the level of the carotid bulb. Flat panel CT of the head was obtained and post processed in a separate workstation with concurrent attending physician supervision. Selected images were sent to PACS. No evidence of hemorrhagic complication. There is mild contrast staining of the left insular and frontal cortex. Repeat left internal carotid artery angiograms with frontal and lateral views of the head showed Amy seen left M3/MCA branch to the left parietal region. Left common carotid artery angiograms with frontal and lateral views of the neck showed vertebra Gretchen of stenosis at the left ICA bulb with more prominent filling defect. At this point, patient was loaded on cangrelor followed by continuous drip. Using biplane roadmap guidance, a 4-7 mm Emboshield NAV6 cerebral protection device was advanced into the cervical left ICA. However, multiple attempts to advance a cerebral protection device through the left ICA kinking proved unsuccessful. The cerebral protection device was subsequently removed. Using biplane roadmap guidance, a 10-8 x 40 mm XACT carotid stent was navigated and deployed from the distal left common carotid artery to  the proximal left internal carotid artery, proximal to the vessel kinking. Suboptimal stent expansion was noted. Then, a 6 x 30 mm Viatrac balloon was navigated into the recently deployed stent. Angioplasty was performed under fluoroscopy. Left internal carotid artery angiograms with frontal and lateral views of the neck showed adequate stent positioning and expansion with brisk anterograde flow. Left internal carotid artery angiograms with frontal and lateral views of the head showed improvement of anterograde flow in the left MCA vascular tree with brisk anterograde flow. Delayed left common carotid artery angiograms with frontal and lateral views of the neck showed no evidence of clot formation within the stent. The  catheter was subsequently Riddle. Right common femoral artery angiogram was obtained in right anterior oblique view. The puncture is at the level of the common femoral artery. The artery has normal caliber, adequate for closure device. The sheath was exchanged over the wire for an 8 [place] Angio-Seal which was utilized for access closure. Immediate hemostasis was achieved. IMPRESSION: 1. Successful mechanical thrombectomy for treatment of a proximal left M2/MCA anterior division branch occlusion achieving complete recanalization (TICI 3). 2. 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 PLAN: Continue cangrelor infusion until patient is transitioned to oral dual antiplatelet therapy. Electronically Signed   By: Baldemar Lenis M.D.   On: 06/05/2023 13:16   CT ANGIO HEAD NECK W WO CM W PERF (CODE STROKE) Addendum Date: 06/05/2023 ADDENDUM REPORT: 06/05/2023 12:25 ADDENDUM: Please note, there is a dictation error within CTA neck impression #1, which should read: The common carotid and internal carotid arteries are patent within the neck. Atherosclerotic plaque bilaterally. Most notably, there is progressive atherosclerotic plaque about the left carotid bifurcation and within the proximal left ICA with resultant severe near occlusive stenosis of the proximal left ICA. Also of note, atherosclerotic plaque about the right carotid bifurcation results in a 40% stenosis at the right ICA origin. Electronically Signed   By: Jackey Loge D.O.   On: 06/05/2023 12:25   Result Date: 06/05/2023 CLINICAL DATA:  Provided history: Cerebrovascular accident, unspecified mechanism. Right-sided weakness. Right-sided facial droop. Altered mental status. EXAM: CT ANGIOGRAPHY HEAD AND NECK CT PERFUSION BRAIN TECHNIQUE: Multidetector CT imaging of the head and neck was performed using  the standard protocol during bolus administration of intravenous contrast. Multiplanar CT image reconstructions and MIPs were obtained to evaluate the vascular anatomy. Carotid stenosis measurements (when applicable) are obtained utilizing NASCET criteria, using the distal internal carotid diameter as the denominator. Multiphase CT imaging of the brain was performed following IV bolus contrast injection. Subsequent parametric perfusion maps were calculated using RAPID software. RADIATION DOSE REDUCTION: This exam was performed according to the departmental dose-optimization program which includes automated exposure control, adjustment of the mA and/or kV according to patient size and/or use of iterative reconstruction technique. CONTRAST:  OMNIPAQUE IOHEXOL 350 MG/ML SOLN COMPARISON:  Noncontrast head CT performed earlier today 06/05/2023. MRA head and MRA neck 10/04/2019. FINDINGS: CTA NECK FINDINGS Aortic arch: Common origin of the innominate and left common carotid arteries. Atherosclerotic plaque within the visualized thoracic aorta and proximal major branch vessels of the neck. Streak/beam hardening artifact arising from a dense right-sided contrast bolus partially obscures the right subclavian artery. Within this limitation, there is no appreciable hemodynamically significant innominate or proximal subclavian artery stenosis. Right carotid system: CCA and ICA patent within the neck. Atherosclerotic plaque, greatest about  the carotid bifurcation. Resultant 40% stenosis at the ICA origin. Left carotid system: CCA and ICA patent within the neck. Atherosclerotic plaque. Most notably, there is prominent atherosclerotic plaque about the carotid bifurcation and within the proximal ICA which has progressed from the prior MRA neck of 10/04/2019. Resultant severe (near occlusive) stenosis of the proximal ICA. Tortuosity of the cervical ICA Vertebral arteries: The vertebral arteries are patent within the neck.  Streak/beam hardening artifact limits evaluation of the right vertebral artery origin. At least moderate stenosis is suspected at this site. Atherosclerotic plaque scattered elsewhere within the cervical right vertebral artery with no more than mild stenosis. Calcified atherosclerotic plaque at the left vertebral artery origin with suspected at least moderate stenosis. Nonstenotic calcified plaque elsewhere within the cervical left vertebral artery. Skeleton: Cervical spondylosis. Other neck: No neck mass or cervical lymphadenopathy. Upper chest: No consolidation within the imaged lung apices. Review of the MIP images confirms the above findings CTA HEAD FINDINGS Anterior circulation: The intracranial internal carotid arteries are patent. As sclerotic plaque within both vessels. No more than mild stenosis on the right. Up to moderate stenosis within the left cavernous segment. The M1 middle cerebral arteries are patent. Abrupt occlusion of a proximal M2 left middle cerebral artery vessel (series 11, image 22). Atherosclerotic irregularity of the M2 and more distal MCA vessels elsewhere. The anterior cerebral arteries are patent. Atherosclerotic irregularity of both vessels without high-grade proximal stenosis. A possible 2 mm periophthalmic left ICA aneurysm with better appreciated on the prior MRA head of 10/04/2019. Posterior circulation: The intracranial vertebral arteries are patent. Atherosclerotic plaque within the right V4 segment sites of mild stenosis. Non-stenotic atherosclerotic plaque within the left V4 segment. The basilar artery is patent. The posterior cerebral arteries are patent. Posterior communicating arteries are diminutive or absent, bilaterally. Venous sinuses: Assessment for dural venous sinus thrombosis is limited due to contrast timing. Anatomic variants: As described. Review of the MIP images confirms the above findings CT Brain Perfusion Findings: ASPECTS: CBF (<30%) Volume: 41mL Perfusion  (Tmax>6.0s) volume: 86mL Mismatch Volume: 45mL Infarction Location:Left MCA vascular territory CTA head impression #1, the CT perfusion head impression and the presence of a severe stenosis of the proximal cervical left ICA called by telephone at the time of interpretation on 06/05/2023 at 8:40 am to provider ERIC [facility] , who verbally acknowledged these results. IMPRESSION: CTA neck: 1. No common carotid and internal carotid arteries are patent within the neck. Atherosclerotic plaque bilaterally. Most notably, there is progressive atherosclerotic plaque about the left carotid bifurcation and within the proximal left ICA with resultant severe, near occlusive stenosis of the proximal left ICA. Also of note, atherosclerotic plaque about the right carotid bifurcation results in 40% stenosis at the right ICA origin. 2. The vertebral arteries are patent within the neck. Atherosclerotic plaque bilaterally as described. Most notably, there is suspected at least moderate stenoses at the bilateral vertebral artery origins. 3. Aortic Atherosclerosis (ICD10-I70.0). CTA head: 1. Abrupt occlusion of a proximal M2 left middle cerebral artery vessel. 2. Background intracranial atherosclerotic disease as described. 3. A possible 2 mm periophthalmic left ICA aneurysm was better appreciated on the prior MRA head of 10/04/2019. CT perfusion head: The perfusion software identifies a 41 mL core infarct in the left MCA vascular territory. The perfusion software identifies an 86 mL region of critically hypoperfused parenchyma within the left MCA vascular territory (utilizing the Tmax>6 seconds threshold). Reported mismatch volume: 45 mL Electronically Signed: By: Jackey Loge D.O. On: 06/05/2023 09:07  CT HEAD CODE STROKE WO CONTRAST Result Date: 06/05/2023 CLINICAL DATA:  Code stroke. Neuro deficit, acute, stroke suspected. EXAM: CT HEAD WITHOUT CONTRAST TECHNIQUE: Contiguous axial images were obtained from the base of the skull  through the vertex without intravenous contrast. RADIATION DOSE REDUCTION: This exam was performed according to the departmental dose-optimization program which includes automated exposure control, adjustment of the mA and/or kV according to patient size and/or use of iterative reconstruction technique. COMPARISON:  Brain MRI 10/04/2019.  Noncontrast head CT 10/03/2019. FINDINGS: Brain: Generalized cerebral atrophy. Loss of gray-white differentiation consistent with an acute infarct within the left insula and within portions of the left frontal operculum (MCA vascular territory). Known small chronic cortically-based infarcts within the left frontal, left parietal and left occipital lobes were better appreciated on the prior brain MRI of 10/04/2019 (acute at that time). Mild patchy and ill-defined hypoattenuation within the cerebral white matter, nonspecific but compatible with chronic small vessel ischemic disease. Subcentimeter infarct within the superior right cerebellar hemisphere, new from the prior MRI but chronic in appearance. Loss of gray-white differentiation there is no acute intracranial hemorrhage. No extra-axial fluid collection. No evidence of an intracranial mass. No midline shift. Vascular: No hyperdense vessel.  Atherosclerotic calcifications. Skull: No calvarial fracture or aggressive osseous lesion. Sinuses/Orbits: No mass or acute finding within the imaged orbits. No significant paranasal sinus disease. ASPECTS [facility] Stroke Program Early CT Score) - Ganglionic level infarction (caudate, lentiform nuclei, internal capsule, insula, M1-M3 cortex): 5 - Supraganglionic infarction (M4-M6 cortex): 2 Total score (0-10 with 10 being normal): 7 Impression #1 called by telephone at the time of interpretation on 06/05/2023 at 8:40 am to provider Dr. Otelia Limes, who verbally acknowledged these results. IMPRESSION: 1. Acute left MCA territory infarct affecting the left insula and portions of the left frontal  operculum. ASPECTS is 7. 2. Known small chronic cortically-based infarcts within the left frontal, left parietal and left occipital lobes were better appreciated on the prior brain MRI of 10/04/2019 (acute at that time). 3. Background mild cerebral white matter chronic small vessel ischemic disease. 4. Subcentimeter infarct within the right cerebellar hemisphere, new from prior MRI but chronic in appearance. 5. Generalized cerebral atrophy. Electronically Signed   By: Jackey Loge D.O.   On: 06/05/2023 08:45     PHYSICAL EXAM  Temp:  [97.7 F (36.5 C)-98.6 F (37 C)] 98.6 F (37 C) (02/22 1200) Pulse Rate:  [67-103] 88 (02/22 1548) Resp:  [5-45] 28 (02/22 1548) BP: (65-164)/(29-116) 140/51 (02/22 1300) SpO2:  [88 %-100 %] 93 % (02/22 1548) FiO2 (%):  [35 %-50 %] 40 % (02/22 1140) Weight:  [138.5 kg] 138.5 kg (02/22 0500)  General -intubated elderly patient in no acute distress  Cardiovascular -regular rhythm on monitor  Neuro - still intubated on vent, eyes closed and not open eyes on voice, with forced eye opening, eyes midline with mild upward gaze, not blinking to visual threat bilaterally, pupils equal and round but small and sluggish, corneal reflexes present, cough and gag reflex present.  Patient does not respond to voice or follow commands.  No significant response to noxious stimuli and no spontaneous movement.   ASSESSMENT/PLAN Mr. Zackarie Chason. is a 83 y.o. male with history of hypertension, diabetes, CKD 3, stroke admitted for right-sided weakness numbness, aphasia, right facial droop, left gaze preference. No TNK given due to outside window.  Patient underwent mechanical thrombectomy with left ICA stenting.   2/15 in the morning patient had an episode of vomiting  with coffee-ground emesis and subsequent tachycardia and tachypnea.  On evaluation this morning, he had increased work of breathing and was unable to maintain his SpO2 even on 100% O2 via nonrebreather.   Discussion was had with patient's wife and daughter, and family stated they would like to proceed with intubation and would like for patient to remain full code at this time.  CCM was consulted, and patient was transferred to ICU and intubated.  He did have an episode of aspiration during intubation and bronchoscopy was subsequently performed.  He was noted to be hypotensive after intubation, and Levophed and vasopressin were started.  Will hold Plavix for now given coffee-ground emesis.  Patient's creatinine was also noted to have worsened.  Stroke:  left MCA infarct with left M2 occlusion and left ICA near occlusion s/p IR with TICI3 and left ICA stenting, likely secondary to large vessel disease source versus cardiomyopathy with low EF CT left MCA infarct CT head and neck left M2 occlusion, left ICA near occlusion, right ICA 43 stenosis, bilateral VA origin severe stenosis CTP 41/86 Status post IR with TICI3 and left ICA stenting MRI left MCA infarct at left insular and left frontal operculum, prominent and confluent petechial hemorrhagic transformation CT repeat 2/7 stable confluent petechial hemorrhage 2D Echo EF 30% LDL 107 HgbA1c 6.1 P2 Y12 = 73 UDS negative SCDs for VTE prophylaxis aspirin 81 mg daily and clopidogrel 75 mg daily prior to admission, now on ASA and Plavix with stable hemoglobin. Ongoing aggressive stroke risk factor management Therapy recommendations:  SNF Disposition: Pending   History of stroke 10/08/2019 admitted for left MCA infarct due to right upper extremity weakness.  MRA head and neck showed left ICA 30% stenosis.  EF 45 to 50%.  LDL 105, A1c 5.6.  Recommended loop recorder at that time but only got 30-day CardioNet monitoring which was no A-fib.  Discharged on DAPT and Lipitor 80.  Respiratory failure Leukocytosis On 2/15, patient had episode of emesis with probable subsequent aspiration and went into respiratory distress with increased work of breathing  unable to maintain SpO2 After discussion with family, patient intubated for airway protection and given respiratory failure Now on precedex and fentanyl Ventilator management per CCM - failed SBT 2/19 Fever Tmax 102.5 WBC 10.1--23.5--44.5--41.4--29.9--19.5--7.4--4.9--5.7 Now on cefepime-> Rocephin Off vancomycin  GI bleeding Acute blood loss anemia Patient had 2 episodes of coffee-ground emesis, likely stress ulcer Resume Plavix 2/18 Resume ASA 2/20  Hemoglobin 11.2-> 8.4->PRBC->8.8->8.1-> 7.6->8.3-> 8.6->7.6 Close monitoring  Cardiomyopathy CHF 09/2019 EF 45 to 50% Current admission EF 30% Cardiology on board, appreciate assistance On DAPT, Agree with GDMT (losartan, entresto, metoprolol and spironolactone) as BP and Cr tolerates  Diabetes HgbA1c 6.1 goal < 7.0 Hyperglycemia improved  Now off insulin drip CBG monitoring SSI  DM education and close PCP follow up  History of hypertension, now hypotensive Stable now But requiring Levophed Off epi and vasopressin Long term BP goal normotensive  Hyperlipidemia Home meds: Lipitor 80 LDL 105, goal < 70 Now on lipitor 80 and Zetia  Continue statin and zetia at discharge  Dysphagia Poststroke dysphagia Speech on board N.p.o. now OG tube reinserted Start trickle feeds 2/18  AKI on CKD  Creatinine 1.78->3.41-> 4.61->4.07-> 3.85->3.66->4.19->5.66 --> 6.92 Baseline creatinine around 1.5 Renally dose medications as appropriate Avoid contrast Aggressive treat hypotension Nephrology has been consulted  Other Stroke Risk Factors Advanced age Obesity, Body mass index is 43.81 kg/m.   Other Active Problems Presyncopal episode with mild hypotension 2/14-suspect was due  to straining to have a bowel movement, will prescribe as needed suppository  Hospital day # 17   This patient is critically ill due to L MCA stroke following IR procedure, respiratory distress and acute kidney injury and at significant risk of  neurological worsening, death form his stroke, respiratory failure and kidney failure due to worsening kidney function. This patient's care requires constant monitoring of vital signs, hemodynamics, respiratory and cardiac monitoring, review of multiple databases, neurological assessment, discussion with family, other specialists and medical decision making of high complexity. I spent 35 minutes of neurocritical care time in the care of this patient.    Windell Norfolk, MD  Neurology 06/22/2023 3:54 PM

## 2023-06-22 NOTE — Progress Notes (Signed)
 Nephrology Follow-Up Consult note   Assessment/Recommendations: Nathan Proctor. is a/an 83 y.o. male with a past medical history significant for CKD 3 AA, HTN, DM2, admitted for CVA complicated by sepsis and acute renal failure.       AKI on CKD 3A: Baseline creatinine 1.8.  Likely ATN in the setting of faction and shock requiring pressors.  Poor dialysis candidate overall but discussed with family and willing to try 2 to 3 days of CRRT to see if this improves his mental status -Start CRRT today; 2 to 3-day limited trial -Continue to monitor daily Cr, Dose meds for GFR -Monitor Daily I/Os, Daily weight  -Maintain MAP>65 for optimal renal perfusion.  -Avoid nephrotoxic medications including NSAIDs -Use synthetic opioids (Fentanyl/Dilaudid) if needed  Acute hypoxic respiratory failure: Aspiration.  Ventilatory management per primary team  Klebsiella pneumonia: Antibiotics per primary team  CVA: Status post thrombectomy.  Mental status continues to be poor.  Continue to monitor  Heart failure with reduced ejection fraction: Manage volume status as above  Uncontrolled type 2 diabetes with hyperglycemia: Management per primary team  Anemia: Multifactorial.  Transfusions per primary team     Recommendations conveyed to primary service.    [facility] 06/22/2023 10:03 AM  ___________________________________________________________  CC: Stroke  Interval History/Subjective: Minimal urine output overnight.  On some low-dose norepinephrine.  Creatinine up to 7 today.  Mental status continues to be poor   Medications:  Current Facility-Administered Medications  Medication Dose Route Frequency Provider Last Rate Last Admin   0.9 %  sodium chloride infusion (Manually program via Guardrails IV Fluids)   Intravenous Once Janeann Forehand D, NP   Held at 06/15/23 1900   acetaminophen (TYLENOL) tablet 650 mg  650 mg Oral Q4H PRN Lynnae January, NP    650 mg at 06/09/23 2307   Or   acetaminophen (TYLENOL) 160 MG/5ML solution 650 mg  650 mg Per Tube Q4H PRN Hetty Blend C, NP   650 mg at 06/20/23 0054   Or   acetaminophen (TYLENOL) suppository 650 mg  650 mg Rectal Q4H PRN Lynnae January, NP       aspirin chewable tablet 81 mg  81 mg Per Tube Daily Marvel Plan, MD   81 mg at 06/22/23 0946   atorvastatin (LIPITOR) tablet 80 mg  80 mg Per Tube Daily Marvel Plan, MD   80 mg at 06/22/23 6045   bisacodyl (DULCOLAX) suppository 10 mg  10 mg Rectal Daily Cristopher Peru, PA-C   10 mg at 06/20/23 1019   carvedilol (COREG) tablet 12.5 mg  12.5 mg Per Tube BID WC Mertha Baars E, PA-C   12.5 mg at 06/22/23 4098   Chlorhexidine Gluconate Cloth 2 % PADS 6 each  6 each Topical Daily Luciano Cutter, MD   6 each at 06/21/23 2119   clopidogrel (PLAVIX) tablet 75 mg  75 mg Per Tube Daily Cristopher Peru, PA-C   75 mg at 06/22/23 0946   docusate (COLACE) 50 MG/5ML liquid 100 mg  100 mg Per Tube BID Mertha Baars E, PA-C   100 mg at 06/22/23 0946   ezetimibe (ZETIA) tablet 10 mg  10 mg Per Tube Daily Marvel Plan, MD   10 mg at 06/22/23 0947   feeding supplement (OSMOLITE 1.5 CAL) liquid 1,000 mL  1,000 mL Per Tube Continuous Lynnell Catalan, MD 40 mL/hr at 06/22/23 0800 Infusion Verify at 06/22/23 0800   feeding supplement (PROSource TF20)  liquid 60 mL  60 mL Per Tube BID Lynnell Catalan, MD   60 mL at 06/22/23 0947   fentaNYL (SUBLIMAZE) injection 25 mcg  25 mcg Intravenous Q1H PRN Lynnell Catalan, MD       free water 300 mL  300 mL Per Tube Q4H Cherlynn Polo, Lauren E, PA-C   300 mL at 06/22/23 0823   heparin injection 1,000-6,000 Units  1,000-6,000 Units CRRT PRN Nathan Level, MD       heparin injection 5,000 Units  5,000 Units Subcutaneous Q8H Cristopher Peru, PA-C   5,000 Units at 06/22/23 0603   insulin aspart (novoLOG) injection 0-20 Units  0-20 Units Subcutaneous Q4H Olalere, Adewale A, MD   11 Units at 06/22/23 0822   insulin glargine-yfgn (SEMGLEE)  injection 12 Units  12 Units Subcutaneous BID Lynnell Catalan, MD   12 Units at 06/22/23 0946   ipratropium-albuterol (DUONEB) 0.5-2.5 (3) MG/3ML nebulizer solution 3 mL  3 mL Nebulization Q6H PRN Migdalia Dk, MD       leptospermum manuka honey (MEDIHONEY) paste 1 Application  1 Application Topical Daily Olalere, Adewale A, MD   1 Application at 06/22/23 0948   liver oil-zinc oxide (DESITIN) 40 % ointment   Topical BID Olalere, Adewale A, MD   Given at 06/22/23 0948   metoCLOPramide (REGLAN) injection 5 mg  5 mg Intravenous Q6H Olalere, Adewale A, MD   5 mg at 06/22/23 0603   multivitamin (RENA-VIT) tablet 1 tablet  1 tablet Per Tube QHS Calton Dach I, RPH   1 tablet at 06/21/23 2117   norepinephrine (LEVOPHED) 4mg  in (0.016 mg/mL) premix infusion  0-40 mcg/min Intravenous Titrated Lynnell Catalan, MD   Stopped at 06/22/23 0340   Oral care mouth rinse  15 mL Mouth Rinse PRN Opyd, Lavone Neri, MD       Oral care mouth rinse  15 mL Mouth Rinse Q2H Luciano Cutter, MD   15 mL at 06/22/23 0254   Oral care mouth rinse  15 mL Mouth Rinse PRN Luciano Cutter, MD       pantoprazole (PROTONIX) injection 40 mg  40 mg Intravenous Q12H Harris, Alphonzo Lemmings D, NP   40 mg at 06/22/23 0946   polyethylene glycol (MIRALAX / GLYCOLAX) packet 17 g  17 g Per Tube Daily PRN Luciano Cutter, MD   17 g at 06/17/23 2208   polyethylene glycol (MIRALAX / GLYCOLAX) packet 17 g  17 g Per Tube BID Cristopher Peru, PA-C   17 g at 06/22/23 2706   prismasol BGK 4/2.5 infusion   CRRT Continuous Nathan Level, MD       prismasol BGK 4/2.5 infusion   CRRT Continuous Nathan Level, MD       prismasol BGK 4/2.5 infusion   CRRT Continuous Nathan Level, MD       senna-docusate (Senokot-S) tablet 1 tablet  1 tablet Per Tube BID Cristopher Peru, PA-C   1 tablet at 06/21/23 1046   sodium chloride 0.9 % primer fluid for CRRT   CRRT PRN Nathan Level, MD       sodium chloride flush (NS) 0.9 % injection 3 mL   3 mL Intravenous Once Kingsley, Victoria K, DO          Review of Systems: Unable to obtain due to the patient's mental status  Physical Exam: Vitals:   06/22/23 0730 06/22/23 0800  BP:  (!) 164/59  Pulse: (!) 101 (!) 101  Resp: (!) 29 (!) 28  Temp:  98.5 F (36.9 C)  SpO2: 98% 96%   Total I/O In: 380 [NG/GT:380] Out: -   Intake/Output Summary (Last 24 hours) at 06/22/2023 1003 Last data filed at 06/22/2023 1610 Gross per 24 hour  Intake 1379.28 ml  Output 275 ml  Net 1104.28 ml   Constitutional: Ill-appearing, lying in bed, no distress ENMT: ears and nose without scars or lesions, MMM CV: normal rate, dependent edema, some nonpitting edema Respiratory: Coarse upper airway sounds, normal work of breathing Gastrointestinal: soft, non-tender, no palpable masses or hernias Skin: no visible lesions or rashes Psych: Awake but not interactive   Test Results I personally reviewed new and old clinical labs and radiology tests Lab Results  Component Value Date   NA 144 06/22/2023   K 3.8 06/22/2023   CL 111 06/22/2023   CO2 20 (L) 06/22/2023   BUN 154 (H) 06/22/2023   CREATININE 6.92 (H) 06/22/2023   GLU 141 03/06/2016   CALCIUM 8.8 (L) 06/22/2023   ALBUMIN 2.2 (L) 06/17/2023   PHOS 5.4 (H) 06/21/2023    CBC Recent Labs  Lab 06/19/23 1544 06/20/23 0447 06/21/23 0602  WBC 4.9 5.7 7.3  HGB 8.3* 8.6* 7.6*  HCT 27.1* 28.0* 25.3*  MCV 100.0 100.7* 103.3*  PLT 115* 113* 110*

## 2023-06-22 NOTE — Progress Notes (Signed)
 eLink Physician-Brief Progress Note Patient Name: Nathan Proctor. DOB: [birth year] MRN: [id number]   Date of Service  06/22/2023  HPI/Events of Note  Patient with labored breathing and frequent head movement.  This movement seems to be affecting CRRT.  Not on vasopressors.  eICU Interventions  Trial of precedex      Intervention Category Intermediate Interventions: OtherDeanna Artis 06/22/2023, 8:38 PM

## 2023-06-22 NOTE — Progress Notes (Signed)
 NAME:  Jayven Naill., MRN:  [id number], DOB:  Feb 03, 1941, LOS: 17 ADMISSION DATE:  06/05/2023 CONSULTATION DATE:  06/05/2023 REFERRING MD:  Otelia Limes - Neuro, CHIEF COMPLAINT: Code Stroke, post-NIR   History of Present Illness:  83 year old man who presented to [facility] ED 2/5 via EMS for unresponsiveness and R-sided deficits. PMHx significant for HTN, HLD, prior CVA without residual deficits, T2DM, CKD stage IIIb, prostate CA, gout.  Patient presented to [facility] ED with significant R-sided deficits, aphasia and poor responsiveness. LKW 2200. Code Stroke initiated. On ED arrival, patient was afebrile with HR 86, BP 154/67, RR 20, SpO2 100%. Noted aphasia, R-sided hemiplegia, R-sided facial droop. Labs were notable for WBC 6.8. Hgb 11.9, Plt 181. INR 1.0. Na 142, K 3.8, CO2 20, Cr 1.59 (baseline), LFTs WNL. Ethanol < 10. COVID negative. CT Head with acute L MCA territory infarct affecting L insula/L frontal operculum, small chronic cortically-based infarcts of L frontal/L parietal/L occipital lobes. CTA Head/Neck with abrupt occlusion of proximal M2 L MCA vessel, ?2mm periophthalmic L ICA aneurysm, bilateral ICA plaque with progressive atherosclerotic plaque L carotid bifurcation with proximal L ICA severe, near-occlusive stenosis.   Taken to [facility] emergently for mechanical thrombectomy ([name]). TICI 3 revascularization achieved. L ICA stenosis and plaque rupture noted at the bulb, treated with stenting/angioplasty. Extubated post-procedure and maintained on cangrelor gtt with plan for transition to DAPT.  PCCM consulted for post-procedure medical management.  Pertinent Medical History:   Past Medical History:  Diagnosis Date   Anemia of chronic disease    Ankle fracture 02/15/2016   Cancer (HCC)    Prostate   Chronic constipation    Chronic kidney disease    stage III   Diabetes mellitus without complication (HCC)    type II    Diabetic peripheral neuropathy (HCC)    Failure to  thrive (0-17)    Fracture of left lower leg    Gout    Hyperlipidemia    Hypertension    Morbid obesity (HCC)    Unstable gait    Significant Hospital Events: Including procedures, antibiotic start and stop dates in addition to other pertinent events   2/5 - Presented to [facility] ED with poor responsiveness, aphasia, R-sided deficits. CT Head with L MCA territory infarct. CTA Head/Neck with occlusion of L MCA M2 segment and severe near-occlusive stenosis of L ICA. Taken to [facility] for MT, stent and angioplasty ([name]). PCCM consulted for post-procedure management. 2/6 transfer out of ICU 2/15 aspiration event early a.m. resulting in respiratory distress, transferred back to ICU and emergently intubated with development of severe shock requiring epi, levo, vaso. Transfused PRBC x1 for possible GI bleed 2/16: 3 pressors, weaning. Coox without cardiogenic component. Antibiotics vanc/cefepime. CT CAP with SBO  2/18: weaned off to only levo now which is likely sedation related  2/20: UOP drop overnight with ongoing poor renal function. Weaning sedation to evaluate neuro status.   Interim History / Subjective:  Off sedation. Tolerating SBT and more awake. Family has opted for trial of dialysis.   Objective:  Blood pressure (!) 164/59, pulse (!) 101, temperature 98.5 F (36.9 C), temperature source Axillary, resp. rate (!) 28, height 5\' 10"  (1.778 m), weight (!) 138.5 kg, SpO2 96%.    Vent Mode: PSV FiO2 (%):  [35 %-50 %] 40 % Set Rate:  [22 bmp] 22 bmp Vt Set:  [570 mL] 570 mL PEEP:  [5 cmH20] 5 cmH20 Pressure Support:  [5 cmH20-10 cmH20] 10  cmH20 Plateau Pressure:  [14 cmH20-17 cmH20] 14 cmH20   Intake/Output Summary (Last 24 hours) at 06/22/2023 1118 Last data filed at 06/22/2023 1610 Gross per 24 hour  Intake 1379.28 ml  Output 275 ml  Net 1104.28 ml   Filed Weights   06/19/23 0500 06/20/23 0500 06/22/23 0500  Weight: 133.4 kg 133 kg (!) 138.5 kg   Physical Exam: General:  obese elderly man.  HENT: ETT and SBFT in place.  Pulmonary: chest clear. Continues to tolerate PSV.  Cardiovascular: HS normal with warm extremities.  GI: abdomen protuberant. Active bowel sounds. FMS in place.  Extremities: no pitting edema  Neuro: off sedation, eyes open and tracks to voice but not following commands. GU: negligible urine in Foley.   Ancillary Tests Personally Reviewed:  Na 144 Creatinine 6.92 HB 7.6  Assessment & Plan:Resolved Hospital Problem List  Acute hypoxemic respiratory failure due to aspiration Klebsiella pneumonia  Ileus and swallowing dysfunction from stroke probably contributed.  Left MCA M2 occlusion and left ICA near occlusion now status post thrombectomy with complete revascularization Likely secondary to large vessel obstruction Heart failure with reduced ejection fraction ejection fraction of 30% with global hypokinesis, normal right ventricular function Hypertension Hyperlipidemia Type 2 diabetes Acute kidney injury superimposed on chronic kidney disease stage IIIb Hypernatremia  Hyperchloremia   Plan:  - More awake today. Keep off sedation.  - Trial of CRRT to see if facilitates extubation as mental status is main barrier as patient has been tolerating SBT.  - Poor long-term HD candidate given poor LV function.  - Continue current insulin regimen.  - Plan is for initial 3 day trial of CRRT.   Best Practice (right click and "Reselect all SmartList Selections" daily)   Diet/type: tubefeeds and NPO DVT prophylaxis SCD Pressure ulcer(s): N/A GI prophylaxis: PPI Lines: Central line and yes and it is still needed Foley:  Yes, and it is still needed Code Status:  full code Last date of multidisciplinary goals of care discussion: Continue to update patient and family daily. They are wanting full care at this time.   CRITICAL CARE Performed by: Lynnell Catalan   Total critical care time: 35 minutes  Critical care time was exclusive of  separately billable procedures and treating other patients.  Critical care was necessary to treat or prevent imminent or life-threatening deterioration.  Critical care was time spent personally by me on the following activities: development of treatment plan with patient and/or surrogate as well as nursing, discussions with consultants, evaluation of patient's response to treatment, examination of patient, obtaining history from patient or surrogate, ordering and performing treatments and interventions, ordering and review of laboratory studies, ordering and review of radiographic studies, pulse oximetry, re-evaluation of patient's condition and participation in multidisciplinary rounds.  Lynnell Catalan, MD [facility] ICU Physician [facility] [place] [facility]  Pager: [phone] Mobile: [phone] After hours: [phone].

## 2023-06-22 NOTE — Progress Notes (Incomplete)
 Palliative Medicine Progress Note   Patient Name: Nathan Proctor.       Date: 06/22/2023 DOB: [birth year]  Age: 83 y.o. MRN#: [id number] Attending Physician: Stroke, Md, MD Primary Care Physician: Fleet Contras, MD Admit Date: 06/05/2023  Reason for Consultation/Follow-up: {Reason for Consult:23484}  HPI/Patient Profile: 83 y.o. male with past medical history of HTN, HLD, diabetes, CKD stage 3b, prior CVA with no residual deficits, prostate cancer, failure to thrive admitted on 06/05/2023 with unresponsiveness with right-sided deficits found to have left MCA infarct with left M2 occlusion and left ICA near occlusion s/p IR with mechanical thrombectomy and stenting 2/5. 2/15 aspiration event resulting in severe shock requiring intubation. Hospitalization also complicated by acute GIB and CHF EF 30%.   Subjective: Chart reviewed including vitals signs, labs, and progress/consult notes. Updates received from Dr. Denese Killings and patient's RN.   Objective:  Physical Exam          Vital Signs: BP (!) 164/59 (BP Location: Left Wrist)   Pulse (!) 101   Temp 98.5 F (36.9 C) (Axillary)   Resp (!) 28   Ht 5\' 10"  (1.778 m)   Wt (!) 138.5 kg   SpO2 96%   BMI 43.81 kg/m  SpO2: SpO2: 96 % O2 Device: O2 Device: Ventilator O2 Flow Rate: O2 Flow Rate (L/min): 15 L/min  Intake/output summary:  Intake/Output Summary (Last 24 hours) at 06/22/2023 1118 Last data filed at 06/22/2023 4098 Gross per 24 hour  Intake 1379.28 ml  Output 275 ml  Net 1104.28 ml    LBM: Last BM Date : 06/22/23     Palliative Assessment/Data: ***     Palliative Medicine Assessment & Plan   Assessment: Principal Problem:   Acute ischemic stroke [facility]) Active Problems:   Primary hypertension   Malnutrition of  moderate degree   Acute systolic heart failure (HCC)   AKI (acute kidney injury) (HCC)   Acute on chronic combined systolic and diastolic CHF (congestive heart failure) (HCC)    Recommendations/Plan: ***  Goals of Care and Additional Recommendations: Limitations on Scope of Treatment: {Recommended Scope and Preferences:21019}  Code Status:   Prognosis:  {Palliative Care Prognosis:23504}  Discharge Planning: {Palliative dispostion:23505}  Care plan was discussed with ***  Thank you for allowing the Palliative Medicine Team to assist in the care of this  patient.   ***   Merry Proud, NP   Please contact Palliative Medicine Team phone at [phone] for questions and concerns.  For individual providers, please see AMION.

## 2023-06-22 NOTE — Procedures (Signed)
 Central Venous Catheter Insertion Procedure Note  Nathan Proctor  [id number]  13-Sep-1940  Date:06/22/23  Time:11:26 AM   Provider Performing:Mahamadou Weltz   Procedure: Insertion of Non-tunneled Central Venous [phone]) with US guidance (56213)   Indication(s) Hemodialysis  Consent Risks of the procedure as well as the alternatives and risks of each were explained to the patient and/or caregiver.  Consent for the procedure was obtained and is signed in the bedside chart  Anesthesia Topical only with 1% lidocaine   Timeout Verified patient identification, verified procedure, site/side was marked, verified correct patient position, special equipment/implants available, medications/allergies/relevant history reviewed, required imaging and test results available.  Sterile Technique Maximal sterile technique including full sterile barrier drape, hand hygiene, sterile gown, sterile gloves, mask, hair covering, sterile ultrasound probe cover (if used).  Procedure Description Area of catheter insertion was cleaned with chlorhexidine and draped in sterile fashion.  With real-time ultrasound guidance a 15cm  HD catheter was placed into the right internal jugular vein. Nonpulsatile blood flow and easy flushing noted in all ports.  The catheter was sutured in place and sterile dressing applied.    Complications/Tolerance None; patient tolerated the procedure well. Chest X-ray is ordered to verify placement for internal jugular or subclavian cannulation.   Chest x-ray is not ordered for femoral cannulation.  EBL Minimal  Specimen(s) None   Nathan Catalan, MD [facility] ICU Physician [facility] [place] Critical Care  Pager: [phone] Or Epic Secure Chat After hours: [phone].  06/22/2023, 11:26 AM

## 2023-06-22 NOTE — Progress Notes (Signed)
 Nutrition Follow-up  DOCUMENTATION CODES:   Non-severe (moderate) malnutrition in context of chronic illness  INTERVENTION:   Tube Feeding via Cortrak:  Osmolite 1.5 at 50 ml/hr Pro-Source TF20 60 mL BID TF at goal provides 1980 kcals, 115 g of protein and 912 mL of free water  Continue Renal MVI per tube  Continue free water flush per MD; current free water flush of 300 mL q 4 hours providing additional 1800 mLof free water/24 hours   Recommend considering scheduled novolog insulin q 4 hours for TF coverage. Recommend considering insulin drip for better blood sugar management given CBGs consistently in 200-300 range, Goal 140-180, and hyperglycemia in acute illness is associated with worse outcomes.   NUTRITION DIAGNOSIS:   Moderate Malnutrition related to chronic illness as evidenced by severe muscle depletion, mild fat depletion, severe fat depletion.  Being addressed via TF  GOAL:   Patient will meet greater than or equal to 90% of their needs  Being addressed via TF   MONITOR:   TF tolerance, Weight trends, Labs, Diet advancement  REASON FOR ASSESSMENT:   Consult Enteral/tube feeding initiation and management  ASSESSMENT:   Pt admitted for acute ischemic stroke with right sides weakness, right facial droop, and aphagia. Pt with PMH of HTN, DM, CKD III, prior CVA w/ no residual deficits.  2/05 - s/p IR mechanical thrombectomy and carotid stent placement, Cortrak placed (tip gastric) 2/07 - s/p MBS with recommendation for dysphagia 1 diet with honey-thick liquids, Transferred to progressive unit 2/09 - Cortrak and tube feeds discontinued 2/12 - repeat MBS with recommendation for dysphagia 2 diet with thin liquids 2/15 - Transferred to ICU for respiratory distress, Intubated  2/16 -  CT abdomen concerning for either close loop obstruction or internal hernia 2/18 - start trickle TF  2/21 - Cortrak placed 2/22 - CRRT initiated  Pt remains on vent support-40%  FiO2, pt has been tolerating SBT but mental status precludes extubation. Remains on levophed.   CRRT initiated today given oliguric AKI with significant azotemia, uremia. Noted pt is poor dialysis candidate overall but family wanting to trial a few days of CRRT to see if mental status improves  Osmolite 1.5 at 50 ml/hr, goal 65 ml/hr via Cortrak  FMS in place with RN documentation of medium stool today but no documented volume. Pt on significant scheduled bowel regimen in addition to reglan with hx of ileus this admission. Monitor stool output and consistency as may need to adjust bowel regimen.   Noted free water flush of 300 mL q 4 hours since 2/20. Received lasix dose yesterday. Noted sodium has improved to 144  Persistent hyperglycemia with CBGs in 200s, as high as 300. Currently on resistant sliding scale q 4 hours and semglee 12 units BID.   Nutritional needs re-assessed today. Current wt 138.5 kg; weight has fluctuated up and down since admission. Noted weight around 120 kg on 2/15 but wt the following day 134.7 kg. Current dry wt unknown.  Noted baseline creatinine 1.8, currently 6.92. Oliguric with estimated 300 mL UOP in 24 hours  Labs: BUN 154, Creatinine 6.92, phosphorus 5.4 (H), magnesium 2.4 (wdl), potassium 3.8 (wdl) Meds:  reglan, rena-vite, miralax BID and prn,  colace BID, senna-docusate BID, dulcolax suppository daily   Diet Order:   Diet Order             Diet NPO time specified  Diet effective now  EDUCATION NEEDS:   Education needs have been addressed  Skin:  Skin Assessment: Skin Integrity Issues: Skin Integrity Issues:: Stage III Stage II: L scrotum Stage III: L scrotum Incisions: R thigh  Last BM:  2/22 via FMS  Height:   Ht Readings from Last 1 Encounters:  06/21/23 5\' 10"  (1.778 m)    Weight:   Wt Readings from Last 1 Encounters:  06/22/23 (!) 138.5 kg   BMI:  Body mass index is 43.81 kg/m.  Estimated Nutritional  Needs:   Kcal:  1950-2150 kcals  Protein:  115-130 g while on CRRT-plan to adjust once discontinued  Fluid:  1.8 L   Romelle Starcher MS, RDN, LDN, CNSC Registered Dietitian 3 Clinical Nutrition RD Inpatient Contact Info in [place]

## 2023-06-23 ENCOUNTER — Inpatient Hospital Stay (HOSPITAL_COMMUNITY): Payer: No Typology Code available for payment source

## 2023-06-23 DIAGNOSIS — J9601 Acute respiratory failure with hypoxia: Secondary | ICD-10-CM | POA: Diagnosis not present

## 2023-06-23 DIAGNOSIS — I639 Cerebral infarction, unspecified: Secondary | ICD-10-CM | POA: Diagnosis not present

## 2023-06-23 DIAGNOSIS — J150 Pneumonia due to Klebsiella pneumoniae: Secondary | ICD-10-CM | POA: Diagnosis not present

## 2023-06-23 DIAGNOSIS — K567 Ileus, unspecified: Secondary | ICD-10-CM | POA: Diagnosis not present

## 2023-06-23 LAB — RENAL FUNCTION PANEL
Albumin: 1.9 g/dL — ABNORMAL LOW (ref 3.5–5.0)
Albumin: 1.9 g/dL — ABNORMAL LOW (ref 3.5–5.0)
Anion gap: 12 (ref 5–15)
Anion gap: 10 (ref 5–15)
BUN: 91 mg/dL — ABNORMAL HIGH (ref 8–23)
BUN: 79 mg/dL — ABNORMAL HIGH (ref 8–23)
CO2: 21 mmol/L — ABNORMAL LOW (ref 22–32)
CO2: 23 mmol/L (ref 22–32)
Calcium: 8.3 mg/dL — ABNORMAL LOW (ref 8.9–10.3)
Calcium: 8.1 mg/dL — ABNORMAL LOW (ref 8.9–10.3)
Chloride: 104 mmol/L (ref 98–111)
Chloride: 104 mmol/L (ref 98–111)
Creatinine, Ser: 4.23 mg/dL — ABNORMAL HIGH (ref 0.61–1.24)
Creatinine, Ser: 3.68 mg/dL — ABNORMAL HIGH (ref 0.61–1.24)
GFR, Estimated: 13 mL/min — ABNORMAL LOW (ref >60)
GFR, Estimated: 16 mL/min — ABNORMAL LOW (ref >60)
Glucose, Bld: 250 mg/dL — ABNORMAL HIGH (ref 70–99)
Glucose, Bld: 350 mg/dL — ABNORMAL HIGH (ref 70–99)
Phosphorus: 3.1 mg/dL (ref 2.5–4.6)
Phosphorus: 2.8 mg/dL (ref 2.5–4.6)
Potassium: 3.9 mmol/L (ref 3.5–5.1)
Potassium: 4.1 mmol/L (ref 3.5–5.1)
Sodium: 137 mmol/L (ref 135–145)
Sodium: 137 mmol/L (ref 135–145)

## 2023-06-23 LAB — GLUCOSE, CAPILLARY
Glucose-Capillary: 224 mg/dL — ABNORMAL HIGH (ref 70–99)
Glucose-Capillary: 213 mg/dL — ABNORMAL HIGH (ref 70–99)
Glucose-Capillary: 181 mg/dL — ABNORMAL HIGH (ref 70–99)
Glucose-Capillary: 247 mg/dL — ABNORMAL HIGH (ref 70–99)
Glucose-Capillary: 241 mg/dL — ABNORMAL HIGH (ref 70–99)
Glucose-Capillary: 217 mg/dL — ABNORMAL HIGH (ref 70–99)

## 2023-06-23 LAB — MAGNESIUM: Magnesium: 2.4 mg/dL (ref 1.7–2.4)

## 2023-06-23 MED ORDER — INSULIN GLARGINE-YFGN 100 UNIT/ML ~~LOC~~ SOLN
20.0000 [IU] | Freq: Two times a day (BID) | SUBCUTANEOUS | Status: DC
  Administered 2023-06-23 – 2023-06-24 (×4): 20 [IU] via SUBCUTANEOUS
  Filled 2023-06-23 (×5): qty 0.2

## 2023-06-23 MED ORDER — DEXMEDETOMIDINE HCL IN NACL 400 MCG/100ML IV SOLN
0.0000 ug/kg/h | INTRAVENOUS | Status: DC
  Administered 2023-06-23 – 2023-06-24 (×5): 0.9 ug/kg/h via INTRAVENOUS
  Administered 2023-06-24: 0.7 ug/kg/h via INTRAVENOUS
  Administered 2023-06-24 (×2): 1 ug/kg/h via INTRAVENOUS
  Administered 2023-06-24 – 2023-06-25 (×3): 0.9 ug/kg/h via INTRAVENOUS
  Administered 2023-06-25: 0.8 ug/kg/h via INTRAVENOUS
  Administered 2023-06-25: 1 ug/kg/h via INTRAVENOUS
  Administered 2023-06-25: 0.7 ug/kg/h via INTRAVENOUS
  Administered 2023-06-25: 1 ug/kg/h via INTRAVENOUS
  Administered 2023-06-25: 0.9 ug/kg/h via INTRAVENOUS
  Administered 2023-06-26: 0.5 ug/kg/h via INTRAVENOUS
  Administered 2023-06-26: 0.4 ug/kg/h via INTRAVENOUS
  Administered 2023-06-26: 0.7 ug/kg/h via INTRAVENOUS
  Administered 2023-06-26: 0.6 ug/kg/h via INTRAVENOUS
  Administered 2023-06-27: 0.4 ug/kg/h via INTRAVENOUS
  Administered 2023-06-27 (×3): 0.9 ug/kg/h via INTRAVENOUS
  Administered 2023-06-27: 0.6 ug/kg/h via INTRAVENOUS
  Administered 2023-06-28: 0.8 ug/kg/h via INTRAVENOUS
  Administered 2023-06-28: 0.9 ug/kg/h via INTRAVENOUS
  Filled 2023-06-23: qty 200
  Filled 2023-06-23: qty 100
  Filled 2023-06-23: qty 300
  Filled 2023-06-23 (×5): qty 100
  Filled 2023-06-23: qty 200
  Filled 2023-06-23: qty 100
  Filled 2023-06-23: qty 200
  Filled 2023-06-23 (×12): qty 100

## 2023-06-23 MED ORDER — DEXMEDETOMIDINE HCL IN NACL 400 MCG/100ML IV SOLN
INTRAVENOUS | Status: AC
  Filled 2023-06-23: qty 100

## 2023-06-23 MED ORDER — FENTANYL CITRATE PF 50 MCG/ML IJ SOSY
50.0000 ug | PREFILLED_SYRINGE | INTRAMUSCULAR | Status: DC | PRN
  Administered 2023-06-23 – 2023-06-24 (×6): 50 ug via INTRAVENOUS
  Administered 2023-06-24 – 2023-06-26 (×3): 100 ug via INTRAVENOUS
  Administered 2023-06-26: 50 ug via INTRAVENOUS
  Administered 2023-06-26 – 2023-06-27 (×6): 100 ug via INTRAVENOUS
  Filled 2023-06-23 (×7): qty 1
  Filled 2023-06-23: qty 2
  Filled 2023-06-23 (×2): qty 1
  Filled 2023-06-23 (×2): qty 2

## 2023-06-23 NOTE — Progress Notes (Signed)
 CRRT having issues running overnight multiple attempts made to resolve the issue: filter changed, HD cath access and return lines changed multiple times, blood flow rate changed, sedation ordered, PRN fentanyl orders increased. The HD cath is very positional and collapses on itself or pulls air at times.   1610- CRRT barely ran over the last hour saying low return pressure at times, when the blood flow rate is increased the negative access pressure alarms despite repositioning. When pulling back blood with a syringe at the HD cath at times the blood pulls back quickly and other times it meets resistance. Since the CRRT was unable to run blood was returned to the patient, Dr. Arta Silence aware, ok to keep CRRT off and day shift nephrologist can reasses.

## 2023-06-23 NOTE — Progress Notes (Signed)
 STROKE TEAM PROGRESS NOTE   SUBJECTIVE (INTERVAL HISTORY) Patient seen and evaluated this morning.    Patient remains intubated Neurologically his exam is unchanged His creatinine has increased from 5.66 to 6.92. Started dialysis, CRRT  OBJECTIVE Temp:  [98 F (36.7 C)-99.2 F (37.3 C)] 98.7 F (37.1 C) (02/23 1200) Pulse Rate:  [57-90] 65 (02/23 1330) Cardiac Rhythm: Normal sinus rhythm (02/23 0800) Resp:  [0-35] 24 (02/23 1330) BP: (76-146)/(20-109) 93/71 (02/23 1330) SpO2:  [90 %-99 %] 94 % (02/23 1330) FiO2 (%):  [30 %-50 %] 40 % (02/23 1208)  Recent Labs  Lab 06/22/23 1915 06/22/23 2311 06/23/23 0310 06/23/23 0751 06/23/23 1137  GLUCAP 270* 266* 224* 213* 181*   Recent Labs  Lab 06/17/23 0423 06/18/23 0124 06/19/23 2121 06/20/23 0447 06/20/23 0823 06/21/23 0602 06/22/23 0608 06/22/23 1608 06/23/23 0509  NA 143   < > 146* 149*  --  148* 144 143 137  K 4.6   < > 3.8 3.8  --  3.9 3.8 3.9 3.9  CL 109   < > 112* 114*  --  114* 111 111 104  CO2 20*   < > 20* 21*  --  20* 20* 22 21*  GLUCOSE 150*   < > 257* 186*  --  227* 315* 289* 250*  BUN 67*   < > 94* 102*  --  131* 154* 133* 91*  CREATININE 4.07*   < > 3.66* 4.19*  --  5.66* 6.92* 5.83* 4.23*  CALCIUM 8.6*   < > 8.6* 8.9  --  9.0 8.8* 8.4* 8.3*  MG 2.1   < > 2.2 2.4 2.3 2.4  --   --  2.4  PHOS 4.8*  --   --   --  4.6 5.4*  --  4.3 3.1   < > = values in this interval not displayed.   Recent Labs  Lab 06/17/23 0423 06/22/23 1608 06/23/23 0509  AST 35  --   --   ALT 29  --   --   ALKPHOS 46  --   --   BILITOT 1.1  --   --   PROT 5.4*  --   --   ALBUMIN 2.2* 1.8* 1.9*    Recent Labs  Lab 06/18/23 0758 06/19/23 0031 06/19/23 1544 06/20/23 0447 06/21/23 0602  WBC 19.5* 7.4 4.9 5.7 7.3  HGB 7.6* 7.7* 8.3* 8.6* 7.6*  HCT 24.6* 25.2* 27.1* 28.0* 25.3*  MCV 99.2 100.8* 100.0 100.7* 103.3*  PLT 157 118* 115* 113* 110*       Component Value Date/Time   CHOL 183 06/06/2023 0557   TRIG 109  06/19/2023 0031   HDL 60 06/06/2023 0557   CHOLHDL 3.1 06/06/2023 0557   VLDL 16 06/06/2023 0557   LDLCALC 107 (H) 06/06/2023 0557   LDLCALC 223 (H) 05/31/2021 0000   Lab Results  Component Value Date   HGBA1C 6.1 (H) 06/05/2023      Component Value Date/Time   LABOPIA NONE DETECTED 06/05/2023 1653   COCAINSCRNUR NONE DETECTED 06/05/2023 1653   LABBENZ NONE DETECTED 06/05/2023 1653   AMPHETMU NONE DETECTED 06/05/2023 1653   THCU NONE DETECTED 06/05/2023 1653   LABBARB NONE DETECTED 06/05/2023 1653    No results for input(s): "ETH" in the last 168 hours.   I have personally reviewed the radiological images below and agree with the radiology interpretations.  DG CHEST PORT 1 VIEW Result Date: 06/23/2023 CLINICAL DATA:  Central line placement EXAM: PORTABLE CHEST 1  VIEW COMPARISON:  06/22/2023 FINDINGS: RIGHT central venous line with tip in distal SVC. LEFT central venous line with tip in the mid SVC. Endotracheal tube and feeding tube unchanged. No pneumothorax. Bilateral small effusions. Low lung volumes. IMPRESSION: 1. Bilateral central venous lines without complication. 2. Low lung volumes and small effusions. Electronically Signed   By: Genevive Bi M.D.   On: 06/23/2023 10:10   DG Chest Port 1 View Result Date: 06/22/2023 CLINICAL DATA:  Central line placement. EXAM: PORTABLE CHEST 1 VIEW COMPARISON:  June 19, 2023. FINDINGS: Stable cardiomediastinal silhouette. Hypoinflation of the lungs is noted with bibasilar subsegmental atelectasis and probable small pleural effusions. Endotracheal tube and feeding tube are unchanged. Left internal jugular catheter is unchanged. Interval placement of right internal jugular catheter with distal tip in expected position of the SVC. No pneumothorax. IMPRESSION: Interval placement of right internal jugular catheter with distal tip in expected position of the SVC. Electronically Signed   By: Lupita Raider M.D.   On: 06/22/2023 11:36   DG  CHEST PORT 1 VIEW Result Date: 06/19/2023 CLINICAL DATA:  Respiratory distress. EXAM: PORTABLE CHEST 1 VIEW COMPARISON:  Radiograph 06/15/2023, CT 06/16/2023 FINDINGS: Tip of the endotracheal tube 4.5 cm from the carina. Enteric tube and left central line unchanged in positioning. Slight improved right lung aeration with persistent basilar opacity. Slight worsening patchy opacity at the left lung base. No pneumothorax or large pleural effusion. No pulmonary edema. Stable heart size and mediastinal contours. IMPRESSION: 1. Bibasilar opacities, improving on the right but worsening on the left. 2. Stable support apparatus. Electronically Signed   By: Narda Rutherford M.D.   On: 06/19/2023 16:02   DG Abd Portable 1V Result Date: 06/18/2023 CLINICAL DATA:  Orogastric tube placement. EXAM: PORTABLE ABDOMEN - 1 VIEW COMPARISON:  Radiographs 06/05/2023.  Abdominal CT 06/16/2023. FINDINGS: 1236 hours. Tip of the enteric tube projects over the right upper quadrant of the abdomen, likely in the distal stomach. There is a small amount contrast material within the stomach. The visualized bowel gas pattern is normal. Patient is rotated to the right with persistent right basilar airspace disease and a small right pleural effusion. IMPRESSION: Enteric tube tip projects over the distal stomach. Electronically Signed   By: Carey Bullocks M.D.   On: 06/18/2023 14:48   CT CHEST ABDOMEN PELVIS WO CONTRAST Result Date: 06/16/2023 CLINICAL DATA:  Septicemia.  Pneumonia. EXAM: CT CHEST, ABDOMEN AND PELVIS WITHOUT CONTRAST TECHNIQUE: Multidetector CT imaging of the chest, abdomen and pelvis was performed following the standard protocol without IV contrast. RADIATION DOSE REDUCTION: This exam was performed according to the departmental dose-optimization program which includes automated exposure control, adjustment of the mA and/or kV according to patient size and/or use of iterative reconstruction technique. COMPARISON:  CT angio  chest from 04/04/2016. FINDINGS: CT CHEST FINDINGS Cardiovascular: Heart size is normal. Aortic atherosclerosis and multi vessel coronary artery calcifications. No pericardial effusion. Mediastinum/Nodes: Thyroid gland, trachea and esophagus appear normal. ET tube is in place with tip above the carina. There is a enteric tube with tip in the stomach.No enlarged mediastinal lymph nodes. Hilar lymph nodes are suboptimally evaluated due to lack of IV contrast. Lungs/Pleura: No pleural effusion or interstitial edema. Bilateral lower lobe airspace consolidation is identified, right greater than left. Imaging findings compatible with multifocal pneumonia. Chronic subsegmental atelectasis this is within the right middle lobe and right lower lobe with asymmetric volume loss and elevation of the right hemidiaphragm. Musculoskeletal: No chest wall mass or suspicious  bone lesions identified. CT ABDOMEN PELVIS FINDINGS Hepatobiliary: No focal liver abnormality. Gallbladder appears normal. No signs of bile duct dilatation. Pancreas: Unremarkable. No pancreatic ductal dilatation or surrounding inflammatory changes. Spleen: Normal in size without focal abnormality. Adrenals/Urinary Tract: Normal adrenal glands. Multiple stones identified within the upper pole of the left kidney which measure up to 7 mm. Bilateral exophytic kidney lesions of varying complexity are identified. These are suboptimally assessed on the current exam reflecting lack of IV contrast material. No signs of hydronephrosis. Urinary bladder is decompressed around a Foley catheter. Stomach/Bowel: Enteric tube tip is in the proximal stomach. There is retained enteric contrast material within the gastric fundus. The appendix is visualized and appears normal. There are several dilated loops of small bowel within the right hemiabdomen. The proximal small bowel loops are normal in caliber. Within the right upper quadrant of the abdomen there are multiple dilated loops  of small bowel measuring up to 3.5 cm containing air-fluid levels. The bowel loops proximal and distal are normal in caliber. Within the limitations of unenhanced technique there is no significant bowel wall thickening. Distal colonic diverticulosis identified without signs of acute diverticulitis. Retained enteric contrast material identified within the colon up to the rectum Vascular/Lymphatic: Aortic atherosclerosis without aneurysm. No signs of abdominopelvic adenopathy. Reproductive: Prostate gland is either atrophic or surgically absent. Penile prosthesis identified. Other: No free fluid or fluid collections. Musculoskeletal: No acute or significant osseous findings. IMPRESSION: 1. Bilateral lower lobe airspace consolidation, right greater than left. Imaging findings compatible with multifocal pneumonia. 2. Chronic subsegmental atelectasis within the right middle lobe and right lower lobe with asymmetric volume loss and elevation of the right hemidiaphragm. 3. Assessment of bowel pathology is limited due to lack of IV and enteric contrast material. There are multiple abnormally dilated loops of small bowel within the right hemiabdomen. Findings are compatible with a small-bowel obstruction. As there is normal caliber proximal small bowel and normal caliber distal small bowel findings are concerning for either close loop obstruction or internal hernia. 4. Distal colonic diverticulosis without signs of acute diverticulitis. 5. Nonobstructing left renal calculi. 6. Bilateral exophytic kidney lesions of varying complexity are identified. These are suboptimally assessed on the current exam reflecting lack of IV contrast material. Further evaluation with nonemergent renal ultrasound is advised. 7.  Aortic Atherosclerosis (ICD10-I70.0). Electronically Signed   By: Signa Kell M.D.   On: 06/16/2023 15:51   ECHOCARDIOGRAM LIMITED Result Date: 06/15/2023    ECHOCARDIOGRAM LIMITED REPORT   Patient Name:   Nathan Proctor. Date of Exam: 06/15/2023 Medical Rec #:  [id number]              Height:       70.0 in Accession #:    [id number]             Weight:       263.9 lb Date of Birth:  1941-03-24              BSA:          2.348 m Patient Age:    83 years               BP:           121/53 mmHg Patient Gender: M                      HR:           90 bpm. Exam Location:  Inpatient  Procedure: Limited Echo, Color Doppler and Intracardiac Opacification Agent            (Both Spectral and Color Flow Doppler were utilized during            procedure). Indications:    Stroke i63.9, Shock  History:        Patient has prior history of Echocardiogram examinations, most                 recent 06/05/2023. CHF; Risk Factors:Hypertension and                 Dyslipidemia.  Sonographer:    Irving Burton Senior RDCS Referring Phys: [phone] [facility] JANE ELLISON  Sonographer Comments: Technically difficult due to body habitus, scanned supine on artificial repirator IMPRESSIONS  1. Very difficult to determine LVEF to limited visualization and frequent ectopy. . Left ventricular ejection fraction, by estimation, is 40 to 45%. The left ventricle has mildly decreased function. Left ventricular endocardial border not optimally defined to evaluate regional wall motion.  2. Right ventricular systolic function was not well visualized. The right ventricular size is not well visualized.  3. The inferior vena cava is normal in size with <50% respiratory variability, suggesting right atrial pressure of 8 mmHg.  4. Limited echo FINDINGS  Left Ventricle: Very difficult to determine LVEF to limited visualization and frequent ectopy. Left ventricular ejection fraction, by estimation, is 40 to 45%. The left ventricle has mildly decreased function. Left ventricular endocardial border not optimally defined to evaluate regional wall motion. Definity contrast agent was given IV to delineate the left ventricular endocardial borders. Right Ventricle: The right ventricular size is  not well visualized. Right vetricular wall thickness was not well visualized. Right ventricular systolic function was not well visualized. Venous: The inferior vena cava is normal in size with less than 50% respiratory variability, suggesting right atrial pressure of 8 mmHg. Additional Comments: Color Doppler performed.  Dina Rich MD Electronically signed by Dina Rich MD Signature Date/Time: 06/15/2023/4:31:38 PM    Final    DG Abd 1 View Result Date: 06/15/2023 CLINICAL DATA:  Orogastric tube placement. EXAM: ABDOMEN - 1 VIEW COMPARISON:  Radiograph 06/08/2023 FINDINGS: Tip of the enteric tube below the diaphragm in the stomach, the side port is just beyond the gastroesophageal junction. There is gaseous gastric distension. Dilated bowel in the right abdomen up 4.7 cm, potentially small bowel, but incompletely included in the field of view. IMPRESSION: 1. Tip of the enteric tube below the diaphragm in the stomach, side-port just beyond the gastroesophageal junction. 2. Gaseous gastric distension. Additional dilated bowel in the right abdomen likely small bowel, although incompletely included in the field of view. Electronically Signed   By: Narda Rutherford M.D.   On: 06/15/2023 15:06   DG CHEST PORT 1 VIEW Result Date: 06/15/2023 CLINICAL DATA:  10031 Cough 10031 EXAM: PORTABLE CHEST 1 VIEW COMPARISON:  June 08, 2023 FINDINGS: The cardiomediastinal silhouette is unchanged in contour.Unchanged elevation of the RIGHT hemidiaphragm. No pleural effusion. No pneumothorax. Bibasilar platelike opacities. Atherosclerotic calcifications. Similar background of interstitial prominence. IMPRESSION: Bibasilar platelike opacities, favored to reflect atelectasis. Electronically Signed   By: Meda Klinefelter M.D.   On: 06/15/2023 12:07   DG CHEST PORT 1 VIEW Result Date: 06/15/2023 CLINICAL DATA:  Respiratory failure, intubation and central line placement. EXAM: PORTABLE CHEST 1 VIEW COMPARISON:  Film  earlier today at 0734 hours FINDINGS: Interval intubation with the endotracheal tube tip approximately 3.5 cm above the carina. Left jugular  central line placement with the line tip in the upper SVC. No pneumothorax. Volume loss of the right lung remains with elevation of the right hemidiaphragm. Improved expansion of the left lung. Potential underlying mild pulmonary interstitial edema. No significant pleural effusions. IMPRESSION: 1. Interval intubation and left jugular central line placement. No pneumothorax. 2. Improved expansion of the left lung. Persistent volume loss of the right lung with elevation of the right hemidiaphragm. Potential underlying mild pulmonary interstitial edema. Electronically Signed   By: Irish Lack M.D.   On: 06/15/2023 10:42   DG Swallowing Func-Speech Pathology Result Date: 06/12/2023 Table formatting from the original result was not included. Modified Barium Swallow Study Patient Details Name: Nathan Proctor. MRN: [id number] Date of Birth: Sep 29, 1940 Today's Date: 06/12/2023 HPI/PMH: HPI: Pt is a 83 y.o. male who was admitted as a code stroke on 02/05 due to acute onset of right-sided weakness, right facial droop and aphasia. MRI from 02/06 displayed an evolving acute left MCA infarct, most pronounced at the left insula and left frontal operculum. Pt has intentionally lost 300 pounds in the recent past, with pre-loss weight at ~600Ib. PMHx includes hypertension, diabetes, CKD, prior CVA. Clinical Impression: Clinical Impression: Pt presents with a mild-moderate oropharyngeal dysphagia (DIGEST Score: 1) primarily characterized by silent penetration of thin liquid at level of VFs, flash penetration of nectar-thick above VFs, and diffuse pharyngeal residue. Pt's dysphagia severity level dropped from DIGEST score of 3 to 1, compared to previous MBS eval on 02/07.     Pharyngeal residue was present throughout all consistencies. Pt's posture for swallowing was consistently  neutral. Pt follows commands for compensatory strategies more readily now with max multimodal cueing. Effective compensatory strategies include multiple swallows, throat clearing, and a straw when used with a verbally cued slow rate. Multiple consecutive sips of thin-liquid presented the most risk for aspiration due to silent penetration of Vfs. Unlike previous MBS study, pt tolerated solid consistency, displaying prolonged mastication but majority clearance of bolus after 2-3 swallows.     Recommendations include upgrading pt's diet to dysphagia 2 (finely chopped) and thin liquids to optimize safety and efficiency for swallowing. SLP f/u to monitor tolerance of thin-liquids with compensatory strategies is highly recommended. Pt needs to remain upright for 30 minutes after PO intake due to remaining pharyngeal residue to optimize airway safety. Factors that may increase risk of adverse event in presence of aspiration [name] & Clearance [name] 2021): Factors that may increase risk of adverse event in presence of aspiration [name] & Clearance [name] 2021): Dependence for feeding and/or oral hygiene Recommendations/Plan: Swallowing Evaluation Recommendations Swallowing Evaluation Recommendations Recommendations: PO diet PO Diet Recommendation: Dysphagia 2 (Finely chopped); Thin liquids (Level 0) Liquid Administration via: Straw Medication Administration: Crushed with puree Supervision: Full supervision/cueing for swallowing strategies; Full assist for feeding Swallowing strategies  : Slow rate; Small bites/sips; Multiple dry swallows after each bite/sip; Clear throat intermittently Postural changes: Position pt fully upright for meals; Stay upright 30-60 min after meals Oral care recommendations: Oral care BID (2x/day); Staff/trained caregiver to provide oral care Treatment Plan Treatment Plan Follow-up recommendations: Follow physicians's recommendations for discharge plan and follow up therapies Functional status assessment: Patient  has had a recent decline in their functional status and demonstrates the ability to make significant improvements in function in a reasonable and predictable amount of time. Recommendations Recommendations for follow up therapy are one component of a multi-disciplinary discharge planning process, led by the attending physician.  Recommendations may be updated based on patient  status, additional functional criteria and insurance authorization. Assessment: Orofacial Exam: Orofacial Exam Oral Cavity: Oral Hygiene: WFL Oral Cavity - Dentition: Adequate natural dentition Orofacial Anatomy: WFL Anatomy: Anatomy: WFL Boluses Administered: Boluses Administered Boluses Administered: Thin liquids (Level 0); Mildly thick liquids (Level 2, nectar thick); Moderately thick liquids (Level 3, honey thick); Puree; Solid  Oral Impairment Domain: Oral Impairment Domain Lip Closure: Interlabial escape, no progression to anterior lip Tongue control during bolus hold: Cohesive bolus between tongue to palatal seal Bolus preparation/mastication: Slow prolonged chewing/mashing with complete recollection Bolus transport/lingual motion: Brisk tongue motion Oral residue: Residue collection on oral structures Location of oral residue : Tongue Initiation of pharyngeal swallow : Pyriform sinuses  Pharyngeal Impairment Domain: Pharyngeal Impairment Domain Soft palate elevation: No bolus between soft palate (SP)/pharyngeal wall (PW) Laryngeal elevation: Partial superior movement of thyroid cartilage/partial approximation of arytenoids to epiglottic petiole Anterior hyoid excursion: Complete anterior movement Epiglottic movement: Complete inversion Laryngeal vestibule closure: Incomplete, narrow column air/contrast in laryngeal vestibule Pharyngeal stripping wave : Present - complete Pharyngeal contraction (A/P view only): N/A Pharyngoesophageal segment opening: Complete distension and complete duration, no obstruction of flow Tongue base  retraction: Trace column of contrast or air between tongue base and PPW Pharyngeal residue: Collection of residue within or on pharyngeal structures Location of pharyngeal residue: Diffuse (>3 areas)  Esophageal Impairment Domain: Esophageal Impairment Domain Esophageal clearance upright position: -- (Not tested) Pill: No data recorded Penetration/Aspiration Scale Score: Penetration/Aspiration Scale Score 1.  Material does not enter airway: Solid; Puree; Moderately thick liquids (Level 3, honey thick) 2.  Material enters airway, remains ABOVE vocal cords then ejected out: Mildly thick liquids (Level 2, nectar thick) 5.  Material enters airway, CONTACTS cords and not ejected out: Thin liquids (Level 0) Compensatory Strategies: Compensatory Strategies Compensatory strategies: Yes Straw: Effective Effective Straw: Mildly thick liquid (Level 2, nectar thick) (Effective with slow rate applied.) Multiple swallows: Effective Effective Multiple Swallows: Thin liquid (Level 0); Mildly thick liquid (Level 2, nectar thick); Moderately thick liquid (Level 3, honey thick); Puree; Solid   General Information: Caregiver present: No  Diet Prior to this Study: Dysphagia 1 (pureed); Moderately thick liquids (Level 3, honey thick)   Temperature : Normal   Respiratory Status: WFL   Supplemental O2: None (Room air)   History of Recent Intubation: Yes  Behavior/Cognition: Alert; Cooperative; Pleasant mood; Requires cueing Self-Feeding Abilities: Needs assist with self-feeding Baseline vocal quality/speech: Normal Volitional Cough: Unable to elicit Volitional Swallow: Able to elicit Exam Limitations: No limitations Goal Planning: Prognosis for improved oropharyngeal function: Good Barriers to Reach Goals: Language deficits No data recorded No data recorded No data recorded Pain: Pain Assessment Pain Assessment: No/denies pain Pain Score: 0 End of Session: Start Time:SLP Start Time (ACUTE ONLY): 1310 Stop Time: SLP Stop Time (ACUTE ONLY):  1335 Time Calculation:SLP Time Calculation (min) (ACUTE ONLY): 25 min Charges: SLP Evaluations $ SLP Speech Visit: 1 Visit SLP Evaluations $MBS Swallow: 1 Procedure $Swallowing Treatment: 1 Procedure SLP visit diagnosis: SLP Visit Diagnosis: Dysphagia, oropharyngeal phase (R13.12) Past Medical History: Past Medical History: Diagnosis Date  Anemia of chronic disease   Ankle fracture 02/15/2016  Cancer (HCC)   Prostate  Chronic constipation   Chronic kidney disease   stage III  Diabetes mellitus without complication (HCC)   type II   Diabetic peripheral neuropathy (HCC)   Failure to thrive (0-17)   Fracture of left lower leg   Gout   Hyperlipidemia   Hypertension   Morbid obesity (HCC)  Unstable gait  Past Surgical History: Past Surgical History: Procedure Laterality Date  IR CT HEAD LTD  06/05/2023  IR INTRAVSC STENT CERV CAROTID W/O EMB-PROT MOD SED INC ANGIO  06/05/2023  IR PERCUTANEOUS ART THROMBECTOMY/INFUSION INTRACRANIAL INC DIAG ANGIO  06/05/2023  IR US GUIDE VASC ACCESS RIGHT  06/05/2023  ORIF ANKLE FRACTURE Left 02/29/2016  Procedure: OPEN REDUCTION INTERNAL FIXATION (ORIF) ANKLE FRACTURE;  Surgeon: Yolonda Kida, MD;  Location: [facility];  Service: Orthopedics;  Laterality: Left;  PROSTATE SURGERY    RADIOLOGY WITH ANESTHESIA N/A 06/05/2023  Procedure: IR WITH ANESTHESIA;  Surgeon: Radiologist, Medication, MD;  Location: [facility];  Service: Radiology;  Laterality: N/A; Claudine Mouton 06/12/2023, 3:16 PM  DG Abd 1 View Result Date: 06/09/2023 CLINICAL DATA:  [id number]. Encounter for feeding tube placement. EXAM: ABDOMEN - 1 VIEW COMPARISON:  Abdomen film 06/05/2023. FINDINGS: Dobbhoff feeding tube is well placed with the radiopaque tip in the distal stomach. The visualized bowel pattern is nonobstructive. There is barium newly seen in the flexures and transverse colon. There is no supine evidence of free air. There is atelectasis in the lung bases. Cardiomegaly. IMPRESSION: 1. Dobbhoff feeding tube well  placed with the radiopaque tip in the distal stomach. 2. Nonobstructive bowel gas pattern. 3. Cardiomegaly. Electronically Signed   By: Almira Bar M.D.   On: 06/09/2023 03:41   DG CHEST PORT 1 VIEW Result Date: 06/08/2023 CLINICAL DATA:  CHF EXAM: PORTABLE CHEST 1 VIEW COMPARISON:  01/06/2023 FINDINGS: Feeding tube in place. There is cardiomegaly with vascular congestion. Chronic elevation of the right hemidiaphragm with right base atelectasis. Interstitial prominence throughout the lungs, right greater than left could reflect interstitial edema. Possible small effusions. No acute bony abnormality. IMPRESSION: Cardiomegaly with vascular congestion and probable mild interstitial edema. Question small bilateral effusions. Chronic elevation of the right hemidiaphragm with right base atelectasis. Electronically Signed   By: Charlett Nose M.D.   On: 06/08/2023 18:20   DG Swallowing Func-Speech Pathology Result Date: 06/07/2023 Table formatting from the original result was not included. Modified Barium Swallow Study Study completed and documented by [name], SLP Student Supervised and reviewed by Harlon Ditty, MA CCC-SLP [facility] Secure Chat Preferred Office [phone] Patient Details Name: Nathan Proctor. MRN: [id number] Date of Birth: [birth year] Today's Date: 06/07/2023 HPI/PMH: HPI: Pt is a 83 y.o. male who was admitted as a code stroke on 02/05 due to acute onset of right-sided weakness, right facial droop and aphasia. MRI from 02/06 displayed an evolving acute left MCA infarct, most pronounced at the left insula and left frontal operculum. Pt has intentionally lost 300 pounds in the recent past, with pre-loss weight at ~600Ib. PMHx includes hypertension, diabetes, CKD, prior CVA. Clinical Impression: Clinical Impression: Pt presented with a moderate-severe oropharyngeal dysphagia (DIGEST Score: 3) primarily characterized by silent aspiration of thin liquid, silent penetration  of nectar-thick liquid, diffuse pharyngeal residue collection during thin/nectar-thick consistencies, and posterior-escape of bolus across multiple consistencies. Pt positioning was a potential limitation for examination due to consistent posterior head position. More anterior head positioning during nectar-thick liquid displayed less pharyngeal residue, increased hyo-laryngeal excursion, and overall better airway safety. This positioning was not present throughout due to pt's receptive language deficits to understand cues. Alternating between honey-thick and puree consistencies provided more oropharyngeal bolus clearance and no signs of aspiration. Recommend a dysphagia 1 (puree) and honey-thick liquid diet to optimize pt's airway safety and potential for meeting nutritional needs. Slow rate, small bites/sips, natural head  posturing, and multiple swallows are compensatory strategies that displayed best efficiency with pt. F/u with SLP for family education, compensatory strategy training, and upgraded diet trials is recommended. Factors that may increase risk of adverse event in presence of aspiration [name] & Clearance [name] 2021): Factors that may increase risk of adverse event in presence of aspiration [name] & Clearance [name] 2021): Weak cough Recommendations/Plan: Swallowing Evaluation Recommendations Swallowing Evaluation Recommendations Recommendations: PO diet PO Diet Recommendation: Dysphagia 1 (Pureed); Moderately thick liquids (Level 3, honey thick) Liquid Administration via: Spoon Medication Administration: Crushed with puree Supervision: Full supervision/cueing for swallowing strategies; Full assist for feeding Swallowing strategies  : Slow rate; Small bites/sips; Multiple dry swallows after each bite/sip (Pt needs to be in a neutral head position (no posterior head tilt).) Postural changes: Position pt fully upright for meals; Stay upright 30-60 min after meals Oral care recommendations: Oral care BID (2x/day);  Staff/trained caregiver to provide oral care Caregiver Recommendations: Have oral suction available Treatment Plan Treatment Plan Treatment recommendations: Therapy as outlined in treatment plan below Follow-up recommendations: Follow physicians's recommendations for discharge plan and follow up therapies Functional status assessment: Patient has had a recent decline in their functional status and demonstrates the ability to make significant improvements in function in a reasonable and predictable amount of time. Treatment frequency: Min 2x/week Treatment duration: 2 weeks Interventions: Aspiration precaution training; Compensatory techniques; Patient/family education; Diet toleration management by SLP; Trials of upgraded texture/liquids Recommendations Recommendations for follow up therapy are one component of a multi-disciplinary discharge planning process, led by the attending physician.  Recommendations may be updated based on patient status, additional functional criteria and insurance authorization. Assessment: Orofacial Exam: Orofacial Exam Oral Cavity - Dentition: Adequate natural dentition Orofacial Anatomy: WFL Anatomy: Anatomy: WFL Boluses Administered: Boluses Administered Boluses Administered: Thin liquids (Level 0); Mildly thick liquids (Level 2, nectar thick); Moderately thick liquids (Level 3, honey thick); Puree; Solid  Oral Impairment Domain: Oral Impairment Domain Lip Closure: Escape progressing to mid-chin Tongue control during bolus hold: Posterior escape of greater than half of bolus Bolus preparation/mastication: -- (Pt did not chew solid and SLP had to remove it from mouth.) Bolus transport/lingual motion: Repetitive/disorganized tongue motion Oral residue: Residue collection on oral structures Location of oral residue : Tongue; Palate Initiation of pharyngeal swallow : Pyriform sinuses  Pharyngeal Impairment Domain: Pharyngeal Impairment Domain Soft palate elevation: No bolus between soft  palate (SP)/pharyngeal wall (PW) Laryngeal elevation: Partial superior movement of thyroid cartilage/partial approximation of arytenoids to epiglottic petiole Anterior hyoid excursion: Partial anterior movement Epiglottic movement: Partial inversion Laryngeal vestibule closure: Incomplete, narrow column air/contrast in laryngeal vestibule Pharyngeal stripping wave : Present - complete Pharyngeal contraction (A/P view only): N/A Pharyngoesophageal segment opening: Complete distension and complete duration, no obstruction of flow Tongue base retraction: Narrow column of contrast or air between tongue base and PPW Pharyngeal residue: Collection of residue within or on pharyngeal structures Location of pharyngeal residue: Diffuse (>3 areas)  Esophageal Impairment Domain: Esophageal Impairment Domain Esophageal clearance upright position: -- (Not tested.) Pill: No data recorded Penetration/Aspiration Scale Score: Penetration/Aspiration Scale Score 1.  Material does not enter airway: Puree; Moderately thick liquids (Level 3, honey thick) 3.  Material enters airway, remains ABOVE vocal cords and not ejected out: Mildly thick liquids (Level 2, nectar thick) 8.  Material enters airway, passes BELOW cords without attempt by patient to eject out (silent aspiration) : Thin liquids (Level 0) Compensatory Strategies: Compensatory Strategies Compensatory strategies: Yes Straw: Ineffective Multiple swallows: Effective (Required max cueing due  to pt's language.) Effective Multiple Swallows: Puree; Moderately thick liquid (Level 3, honey thick); Mildly thick liquid (Level 2, nectar thick)   General Information: Caregiver present: No  Diet Prior to this Study: NPO   Temperature : Normal   Respiratory Status: WFL   Supplemental O2: None (Room air)   No data recorded Behavior/Cognition: Alert; Cooperative; Pleasant mood; Requires cueing Self-Feeding Abilities: Needs assist with self-feeding Baseline vocal quality/speech: Dysphonic  Volitional Cough: Unable to elicit Volitional Swallow: Able to elicit Exam Limitations: Poor positioning Goal Planning: Prognosis for improved oropharyngeal function: Good Barriers to Reach Goals: Language deficits No data recorded No data recorded Consulted and agree with results and recommendations: Pt unable/family or caregiver not available Pain: Pain Assessment Pain Assessment: No/denies pain Faces Pain Scale: 0 Facial Expression: 0 Body Movements: 0 Muscle Tension: 0 Compliance with ventilator (intubated pts.): N/A Vocalization (extubated pts.): 0 CPOT Total: 0 End of Session: Start Time:SLP Start Time (ACUTE ONLY): 0903 Stop Time: SLP Stop Time (ACUTE ONLY): 0930 Time Calculation:SLP Time Calculation (min) (ACUTE ONLY): 27 min Charges: SLP Evaluations $ SLP Speech Visit: 1 Visit SLP Evaluations $BSS Swallow: 1 Procedure $MBS Swallow: 1 Procedure $ SLP EVAL LANGUAGE/SOUND PRODUCTION: 1 Procedure $Swallowing Treatment: 1 Procedure SLP visit diagnosis: SLP Visit Diagnosis: Dysphagia, oropharyngeal phase (R13.12) Past Medical History: Past Medical History: Diagnosis Date  Anemia of chronic disease   Ankle fracture 02/15/2016  Cancer (HCC)   Prostate  Chronic constipation   Chronic kidney disease   stage III  Diabetes mellitus without complication (HCC)   type II   Diabetic peripheral neuropathy (HCC)   Failure to thrive (0-17)   Fracture of left lower leg   Gout   Hyperlipidemia   Hypertension   Morbid obesity (HCC)   Unstable gait  Past Surgical History: Past Surgical History: Procedure Laterality Date  IR CT HEAD LTD  06/05/2023  IR INTRAVSC STENT CERV CAROTID W/O EMB-PROT MOD SED INC ANGIO  06/05/2023  IR PERCUTANEOUS ART THROMBECTOMY/INFUSION INTRACRANIAL INC DIAG ANGIO  06/05/2023  IR US GUIDE VASC ACCESS RIGHT  06/05/2023  ORIF ANKLE FRACTURE Left 02/29/2016  Procedure: OPEN REDUCTION INTERNAL FIXATION (ORIF) ANKLE FRACTURE;  Surgeon: Yolonda Kida, MD;  Location: [facility];  Service: Orthopedics;  Laterality:  Left;  PROSTATE SURGERY    RADIOLOGY WITH ANESTHESIA N/A 06/05/2023  Procedure: IR WITH ANESTHESIA;  Surgeon: Radiologist, Medication, MD;  Location: [facility];  Service: Radiology;  Laterality: N/A; [place], Riley Nearing 06/07/2023, 12:15 PM  CT HEAD WO CONTRAST ( ) Result Date: 06/07/2023 CLINICAL DATA:  83 year old male status post code stroke presentation, left MCA M2 occlusion, with left MCA infarct with confluent petechial hemorrhage. EXAM: CT HEAD WITHOUT CONTRAST TECHNIQUE: Contiguous axial images were obtained from the base of the skull through the vertex without intravenous contrast. RADIATION DOSE REDUCTION: This exam was performed according to the departmental dose-optimization program which includes automated exposure control, adjustment of the mA and/or kV according to patient size and/or use of iterative reconstruction technique. COMPARISON:  Brain MRI yesterday.  Head CT 06/05/2023. FINDINGS: Brain: Confluent mixed density infarct in the left MCA middle division, epicenter at the insula and operculum. Confluent petechial hemorrhage on series 3, image 16 appears stable from the MRI. Superimposed trace subarachnoid blood is difficult to exclude by CT (series 3, image 13), but was not apparent on MRI. Stable mild mass effect on the left lateral ventricle with only trace rightward midline shift (series 3, image 16). No ventriculomegaly. Elsewhere gray-white differentiation is stable from  the presentation CT. Basilar cisterns remain patent. Vascular: Resolved hyperdense left MCA since presentation. Calcified atherosclerosis at the skull base. Skull: Stable, intact. Sinuses/Orbits: Visualized paranasal sinuses and mastoids are stable and well aerated. Other: Left nasoenteric tube now in place. Stable orbit and scalp soft tissues. IMPRESSION: 1. Stable by CT confluent Left MCA middle division infarct with petechial hemorrhage (Heidelberg classification 1b: HI2, confluent petechiae, no mass effect).  Questionable trace superimposed SAH, but was not apparent on MRI. 2. Stable minimal intracranial mass effect. 3. No new intracranial abnormality. Electronically Signed   By: Odessa Fleming M.D.   On: 06/07/2023 05:37   MR BRAIN WO CONTRAST Result Date: 06/06/2023 CLINICAL DATA:  Follow-up examination for stroke. EXAM: MRI HEAD WITHOUT CONTRAST TECHNIQUE: Multiplanar, multiecho pulse sequences of the brain and surrounding structures were obtained without intravenous contrast. COMPARISON:  Comparison made to multiple previous exams from 06/05/2023. FINDINGS: Brain: Examination degraded by motion artifact. Cerebral volume within normal limits for age. Patchy T2/FLAIR hyperintensity involving the periventricular deep white matter both cerebral hemispheres, consistent with chronic small vessel ischemic disease, mild in nature. Small remote infarct noted within the right cerebellum. Confluent restricted diffusion involving the left insula and left frontal operculum, consistent with evolving acute left MCA distribution infarct. Area of infarction measures up to approximately 6 cm in AP diameter. Prominent associated susceptibility artifact, consistent with petechial hemorrhage (series 7, image 61) ( Heidelberg classification 1b: HI2, confluent petechiae, no mass effect. No frank or organized hematoma evident by MRI. No significant regional mass effect. Few additional small foci of infarction noted within the left parieto-occipital region posteriorly (series 3, images 40, 29). Apparent diffusion signal along the right frontal parafalcine region felt to be consistent with artifact related to dural calcification. Note made of a few additional chronic micro hemorrhages within the left cerebellum and right thalamus. No mass lesion or midline shift. Ventricles normal size without hydrocephalus. No extra-axial fluid collection. Pituitary gland suprasellar region within normal limits. Vascular: Major intracranial vascular flow voids  are maintained. Skull and upper cervical spine: Craniocervical junction within normal limits. Bone marrow signal intensity overall within normal limits. No scalp soft tissue abnormality. Sinuses/Orbits: Prior bilateral ocular lens replacement. Paranasal sinuses are clear. No mastoid effusion. Other: None. IMPRESSION: 1. Evolving acute left MCA distribution infarct, most pronounced at the left insula and left frontal operculum. Prominent associated petechial hemorrhage without frank intraparenchymal hematoma (Heidelberg classification 1b: HI2, confluent petechiae, no mass effect). 2. Few additional small foci of acute infarction within the left parieto-occipital region posteriorly. 3. Underlying mild chronic microvascular ischemic disease with small remote right cerebellar infarct. Electronically Signed   By: Rise Mu M.D.   On: 06/06/2023 03:12   ECHOCARDIOGRAM COMPLETE Result Date: 06/05/2023    ECHOCARDIOGRAM REPORT   Patient Name:   Nathan Proctor. Date of Exam: 06/05/2023 Medical Rec #:  [id number]              Height:       70.0 in Accession #:    [id number]             Weight:       292.3 lb Date of Birth:  05-28-1940              BSA:          2.453 m Patient Age:    82 years               BP:  129/72 mmHg Patient Gender: M                      HR:           89 bpm. Exam Location:  Inpatient Procedure: 2D Echo, Color Doppler, Cardiac Doppler and Intracardiac            Opacification Agent Indications:    Stroke I63.9  History:        Patient has prior history of Echocardiogram examinations, most                 recent 10/06/2019. Risk Factors:Diabetes, Hypertension and                 Dyslipidemia.  Sonographer:    Harriette Bouillon RDCS Referring Phys: Lynnae January IMPRESSIONS  1. Left ventricular ejection fraction, by estimation, is 30%. The left ventricle has moderately decreased function. The left ventricle demonstrates global hypokinesis. There is mild left ventricular hypertrophy.   2. Right ventricular systolic function is normal. The right ventricular size is normal.  3. Trivial mitral valve regurgitation.  4. The aortic valve is tricuspid. Aortic valve regurgitation is not visualized.  5. The inferior vena cava is normal in size with greater than 50% respiratory variability, suggesting right atrial pressure of 3 mmHg. Comparison(s): The left ventricular function is worsened. FINDINGS  Left Ventricle: Left ventricular ejection fraction, by estimation, is 30%. The left ventricle has moderately decreased function. The left ventricle demonstrates global hypokinesis. Definity contrast agent was given IV to delineate the left ventricular endocardial borders. The left ventricular internal cavity size was normal in size. There is mild left ventricular hypertrophy. Right Ventricle: The right ventricular size is normal. Right vetricular wall thickness was not assessed. Right ventricular systolic function is normal. Left Atrium: Left atrial size was normal in size. Right Atrium: Right atrial size was normal in size. Pericardium: There is no evidence of pericardial effusion. Mitral Valve: There is mild thickening of the mitral valve leaflet(s). Mild to moderate mitral annular calcification. Trivial mitral valve regurgitation. Tricuspid Valve: The tricuspid valve is normal in structure. Tricuspid valve regurgitation is trivial. Aortic Valve: The aortic valve is tricuspid. Aortic valve regurgitation is not visualized. Pulmonic Valve: The pulmonic valve was normal in structure. Pulmonic valve regurgitation is not visualized. Aorta: The aortic root and ascending aorta are structurally normal, with no evidence of dilitation. Venous: The inferior vena cava is normal in size with greater than 50% respiratory variability, suggesting right atrial pressure of 3 mmHg. IAS/Shunts: The interatrial septum was not assessed.  LEFT VENTRICLE PLAX 2D LVIDd:         5.00 cm   Diastology LVIDs:         4.40 cm   LV e'  lateral: 5.33 cm/s LV PW:         1.20 cm LV IVS:        1.10 cm LVOT diam:     2.30 cm LV SV:         55 LV SV Index:   23 LVOT Area:     4.15 cm  RIGHT VENTRICLE RV S prime:     14.40 cm/s LEFT ATRIUM         Index LA diam:    4.60 cm 1.88 cm/m  AORTIC VALVE LVOT Vmax:   65.50 cm/s LVOT Vmean:  45.200 cm/s LVOT VTI:    0.133 m  AORTA Ao Root diam: 3.00 cm Ao Asc diam:  3.50 cm  SHUNTS Systemic VTI:  0.13 m Systemic Diam: 2.30 cm Dietrich Pates MD Electronically signed by Dietrich Pates MD Signature Date/Time: 06/05/2023/9:33:38 PM    Final    DG Abd Portable 1V Result Date: 06/05/2023 CLINICAL DATA:  Feeding tube placement. EXAM: PORTABLE ABDOMEN - 1 VIEW COMPARISON:  None Available. FINDINGS: Distal tip of feeding tube is seen in expected position of distal stomach. IMPRESSION: Distal tip of feeding tube seen in expected position of distal stomach. Electronically Signed   By: Lupita Raider M.D.   On: 06/05/2023 15:49   CT HEAD WO CONTRAST Result Date: 06/05/2023 CLINICAL DATA:  Stroke, follow-up. Status post intracranial mechanical thrombectomy and stenting of the left ICA bifurcation. EXAM: CT HEAD WITHOUT CONTRAST TECHNIQUE: Contiguous axial images were obtained from the base of the skull through the vertex without intravenous contrast. RADIATION DOSE REDUCTION: This exam was performed according to the departmental dose-optimization program which includes automated exposure control, adjustment of the mA and/or kV according to patient size and/or use of iterative reconstruction technique. COMPARISON:  CT head without contrast and CT angio head and neck 06/05/2023. By plain CT 06/05/2023. FINDINGS: Brain: The study is mildly degraded by patient motion. The left insular and left opercular infarct is somewhat obscured by patient motion. The cortex is slightly hyperdense, potentially reflecting reperfusion. No hemorrhage is present. Basal ganglia are intact. Subcortical white matter hypoattenuation in the high left  frontal lobe is new. Mild right-sided white matter disease is stable. Basal ganglia are intact. A remote lacunar infarct is again noted in the right cerebellum. The brainstem and cerebellum are otherwise within normal limits. Vascular: Atherosclerotic calcifications are present within the cavernous internal carotid arteries and at the normal origin of both vertebral arteries. No hyperdense vessel is present. Skull: Calvarium is intact. No focal lytic or blastic lesions are present. No significant extracranial soft tissue lesion is present. Sinuses/Orbits: The paranasal sinuses and mastoid air cells are clear. Bilateral lens replacements are noted. Globes and orbits are otherwise unremarkable. IMPRESSION: 1. The left insular and left opercular infarct is somewhat obscured by patient motion. 2. The cortex is slightly hyperdense, potentially reflecting reperfusion. 3. No hemorrhage. 4. Subcortical white matter hypoattenuation in the high left frontal lobe is new. This may reflect ischemic changes or edema related to the reperfusion. 5. Remote lacunar infarct of the right cerebellum. 6. Stable mild right-sided white matter disease. This likely reflects the sequela of chronic microvascular ischemia. Electronically Signed   By: Marin Roberts M.D.   On: 06/05/2023 14:38   IR PERCUTANEOUS ART THROMBECTOMY/INFUSION INTRACRANIAL INC DIAG ANGIO Result Date: 06/05/2023 INDICATION: 83 year old male presenting with right-sided weakness and aphasia; NIHSS 28. His last known well was 10 p.m. on 06/04/2023. His past medical history significant for prior stroke, hypertension, diabetes and chronic kidney disease; baseline modified Rankin scale 0. Head CT showed hypodensity within the left insula, basal ganglia and left frontal operculum (ASPECTS 7). No IV thrombolytic given as patient was outside the window. CT angiogram of the head and neck showed an occlusion of a left M2/MCA anterior division branch. CT perfusion showed a  41 mL core infarct with a 45 mL ischemic penumbra. She was transferred to our service for mechanical thrombectomy. EXAM: ULTRASOUND-GUIDED VASCULAR ACCESS DIAGNOSTIC CEREBRAL ANGIOGRAM MECHANICAL THROMBECTOMY FLAT PANEL HEAD CT LEFT CAROTID STENTING AND ANGIOPLASTY WITHOUT CEREBRAL PROTECTION DEVICE COMPARISON:  CT/CT angiogram of the head and neck June 05, 2023. MEDICATIONS: No antibiotics administered. ANESTHESIA/SEDATION: The procedure was performed under general  anesthesia. CONTRAST:  80 mL of Omnipaque 300 milligram/mL FLUOROSCOPY: Radiation Exposure Index (as provided by the fluoroscopic device): 1315 mGy Kerma COMPLICATIONS: None immediate. TECHNIQUE: Informed written consent was obtained from the patient's wife after a thorough discussion of the procedural risks, benefits and alternatives. All questions were addressed. Maximal Sterile Barrier Technique was utilized including caps, mask, sterile gowns, sterile gloves, sterile drape, hand hygiene and skin antiseptic. A timeout was performed prior to the initiation of the procedure. The right groin was prepped and draped in the usual sterile fashion. Using a micropuncture kit and the modified Seldinger technique, access was gained to the right common femoral artery and an 8 French sheath was placed. Real-time ultrasound guidance was utilized for vascular access including the acquisition of a permanent ultrasound image documenting patency of the accessed vessel. Under fluoroscopy, an 8 [place] Walrus balloon guide catheter was navigated over a 6 [place] VTK catheter and a 0.035" Terumo Glidewire into the aortic arch. The catheter was placed into the left common carotid artery and then advanced into the left internal carotid artery. The diagnostic catheter was removed. Frontal and lateral angiograms of the head were obtained. FINDINGS: 1. Ultrasound showed heavily calcified right common femoral artery is maintained patency and caliber. 2. Proximal occlusion of a  left M2/MCA anterior division branch. 3. Atherosclerotic changes of the intracranial left ICA with mild stenosis at the distal cavernous segment. 4. 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 PROCEDURE: Using biplane roadmap guidance, a Red 62 aspiration catheter was navigated over Colossus 35 microguidewire into the cavernous segment of the left ICA. The aspiration catheter was then advanced to the level of occlusion and connected to an aspiration pump. Continuous aspiration was performed for 2 minutes. The guide catheter was connected to a VacLok syringe and the guiding catheter balloon was inflated. The aspiration catheter was subsequently removed under constant aspiration. The guide catheter was aspirated for debris. Left internal carotid artery angiograms with frontal and lateral views of the head showed complete recanalization of the left MCA vascular tree. The guide catheter was retracted into the neck. Frontal and lateral angiograms of the neck were obtained. Improvement of the degree of stenosis in the carotid bulb compared to prior CT angiogram. There is prominent luminal irregularity at the carotid bulb residual moderate stenosis. Increased tortuosity of the proximal/mid cervical left ICA with kinking. Left internal carotid artery angiograms with left anterior oblique views of the neck showed evidence of filling defects at the level of the carotid bulb. Flat panel CT of the head was obtained and post processed in a separate workstation with concurrent attending physician supervision. Selected images were sent to PACS. No evidence of hemorrhagic complication. There is mild contrast staining of the left insular and frontal cortex. Repeat left internal carotid artery angiograms with frontal and lateral views of the head showed Amy seen left M3/MCA branch to the left parietal region. Left common carotid  artery angiograms with frontal and lateral views of the neck showed vertebra Gretchen of stenosis at the left ICA bulb with more prominent filling defect. At this point, patient was loaded on cangrelor followed by continuous drip. Using biplane roadmap guidance, a 4-7 mm Emboshield NAV6 cerebral protection device was advanced into the cervical left ICA. However, multiple attempts to advance a cerebral protection device through the left ICA kinking proved unsuccessful. The cerebral protection device was subsequently removed. Using biplane roadmap  guidance, a 10-8 x 40 mm XACT carotid stent was navigated and deployed from the distal left common carotid artery to the proximal left internal carotid artery, proximal to the vessel kinking. Suboptimal stent expansion was noted. Then, a 6 x 30 mm Viatrac balloon was navigated into the recently deployed stent. Angioplasty was performed under fluoroscopy. Left internal carotid artery angiograms with frontal and lateral views of the neck showed adequate stent positioning and expansion with brisk anterograde flow. Left internal carotid artery angiograms with frontal and lateral views of the head showed improvement of anterograde flow in the left MCA vascular tree with brisk anterograde flow. Delayed left common carotid artery angiograms with frontal and lateral views of the neck showed no evidence of clot formation within the stent. The catheter was subsequently Riddle. Right common femoral artery angiogram was obtained in right anterior oblique view. The puncture is at the level of the common femoral artery. The artery has normal caliber, adequate for closure device. The sheath was exchanged over the wire for an 8 [place] Angio-Seal which was utilized for access closure. Immediate hemostasis was achieved. IMPRESSION: 1. Successful mechanical thrombectomy for treatment of a proximal left M2/MCA anterior division branch occlusion achieving complete recanalization (TICI 3). 2.  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 PLAN: Continue cangrelor infusion until patient is transitioned to oral dual antiplatelet therapy. Electronically Signed   By: Baldemar Lenis M.D.   On: 06/05/2023 13:16   IR US Guide Vasc Access Right Result Date: 06/05/2023 INDICATION: 83 year old male presenting with right-sided weakness and aphasia; NIHSS 28. His last known well was 10 p.m. on 06/04/2023. His past medical history significant for prior stroke, hypertension, diabetes and chronic kidney disease; baseline modified Rankin scale 0. Head CT showed hypodensity within the left insula, basal ganglia and left frontal operculum (ASPECTS 7). No IV thrombolytic given as patient was outside the window. CT angiogram of the head and neck showed an occlusion of a left M2/MCA anterior division branch. CT perfusion showed a 41 mL core infarct with a 45 mL ischemic penumbra. She was transferred to our service for mechanical thrombectomy. EXAM: ULTRASOUND-GUIDED VASCULAR ACCESS DIAGNOSTIC CEREBRAL ANGIOGRAM MECHANICAL THROMBECTOMY FLAT PANEL HEAD CT LEFT CAROTID STENTING AND ANGIOPLASTY WITHOUT CEREBRAL PROTECTION DEVICE COMPARISON:  CT/CT angiogram of the head and neck June 05, 2023. MEDICATIONS: No antibiotics administered. ANESTHESIA/SEDATION: The procedure was performed under general anesthesia. CONTRAST:  80 mL of Omnipaque 300 milligram/mL FLUOROSCOPY: Radiation Exposure Index (as provided by the fluoroscopic device): 1315 mGy Kerma COMPLICATIONS: None immediate. TECHNIQUE: Informed written consent was obtained from the patient's wife after a thorough discussion of the procedural risks, benefits and alternatives. All questions were addressed. Maximal Sterile Barrier Technique was utilized including caps, mask, sterile gowns, sterile gloves, sterile drape,  hand hygiene and skin antiseptic. A timeout was performed prior to the initiation of the procedure. The right groin was prepped and draped in the usual sterile fashion. Using a micropuncture kit and the modified Seldinger technique, access was gained to the right common femoral artery and an 8 French sheath was placed. Real-time ultrasound guidance was utilized for vascular access including the acquisition of a permanent ultrasound image documenting patency of the accessed vessel. Under fluoroscopy, an 8 [place] Walrus balloon guide catheter was navigated over a 6 [place] VTK catheter and a 0.035" Terumo Glidewire into the aortic arch. The catheter was placed into  the left common carotid artery and then advanced into the left internal carotid artery. The diagnostic catheter was removed. Frontal and lateral angiograms of the head were obtained. FINDINGS: 1. Ultrasound showed heavily calcified right common femoral artery is maintained patency and caliber. 2. Proximal occlusion of a left M2/MCA anterior division branch. 3. Atherosclerotic changes of the intracranial left ICA with mild stenosis at the distal cavernous segment. 4. 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 PROCEDURE: Using biplane roadmap guidance, a Red 62 aspiration catheter was navigated over Colossus 35 microguidewire into the cavernous segment of the left ICA. The aspiration catheter was then advanced to the level of occlusion and connected to an aspiration pump. Continuous aspiration was performed for 2 minutes. The guide catheter was connected to a VacLok syringe and the guiding catheter balloon was inflated. The aspiration catheter was subsequently removed under constant aspiration. The guide catheter was aspirated for debris. Left internal carotid artery angiograms with frontal and lateral views of the head showed complete recanalization of  the left MCA vascular tree. The guide catheter was retracted into the neck. Frontal and lateral angiograms of the neck were obtained. Improvement of the degree of stenosis in the carotid bulb compared to prior CT angiogram. There is prominent luminal irregularity at the carotid bulb residual moderate stenosis. Increased tortuosity of the proximal/mid cervical left ICA with kinking. Left internal carotid artery angiograms with left anterior oblique views of the neck showed evidence of filling defects at the level of the carotid bulb. Flat panel CT of the head was obtained and post processed in a separate workstation with concurrent attending physician supervision. Selected images were sent to PACS. No evidence of hemorrhagic complication. There is mild contrast staining of the left insular and frontal cortex. Repeat left internal carotid artery angiograms with frontal and lateral views of the head showed Amy seen left M3/MCA branch to the left parietal region. Left common carotid artery angiograms with frontal and lateral views of the neck showed vertebra Gretchen of stenosis at the left ICA bulb with more prominent filling defect. At this point, patient was loaded on cangrelor followed by continuous drip. Using biplane roadmap guidance, a 4-7 mm Emboshield NAV6 cerebral protection device was advanced into the cervical left ICA. However, multiple attempts to advance a cerebral protection device through the left ICA kinking proved unsuccessful. The cerebral protection device was subsequently removed. Using biplane roadmap guidance, a 10-8 x 40 mm XACT carotid stent was navigated and deployed from the distal left common carotid artery to the proximal left internal carotid artery, proximal to the vessel kinking. Suboptimal stent expansion was noted. Then, a 6 x 30 mm Viatrac balloon was navigated into the recently deployed stent. Angioplasty was performed under fluoroscopy. Left internal carotid artery angiograms with  frontal and lateral views of the neck showed adequate stent positioning and expansion with brisk anterograde flow. Left internal carotid artery angiograms with frontal and lateral views of the head showed improvement of anterograde flow in the left MCA vascular tree with brisk anterograde flow. Delayed left common carotid artery angiograms with frontal and lateral views of the neck showed no evidence of clot formation within the stent. The catheter was subsequently Riddle. Right common femoral artery angiogram was obtained in right anterior oblique view. The puncture is at the level of the common femoral artery. The artery has normal caliber, adequate for closure device. The sheath was  exchanged over the wire for an 8 [place] Angio-Seal which was utilized for access closure. Immediate hemostasis was achieved. IMPRESSION: 1. Successful mechanical thrombectomy for treatment of a proximal left M2/MCA anterior division branch occlusion achieving complete recanalization (TICI 3). 2. 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 PLAN: Continue cangrelor infusion until patient is transitioned to oral dual antiplatelet therapy. Electronically Signed   By: Baldemar Lenis M.D.   On: 06/05/2023 13:16   IR INTRAVSC STENT CERV CAROTID W/O EMB-PROT MOD SED Result Date: 06/05/2023 INDICATION: 83 year old male presenting with right-sided weakness and aphasia; NIHSS 28. His last known well was 10 p.m. on 06/04/2023. His past medical history significant for prior stroke, hypertension, diabetes and chronic kidney disease; baseline modified Rankin scale 0. Head CT showed hypodensity within the left insula, basal ganglia and left frontal operculum (ASPECTS 7). No IV thrombolytic given as patient was outside the window. CT angiogram of the head and neck showed an occlusion of a left  M2/MCA anterior division branch. CT perfusion showed a 41 mL core infarct with a 45 mL ischemic penumbra. She was transferred to our service for mechanical thrombectomy. EXAM: ULTRASOUND-GUIDED VASCULAR ACCESS DIAGNOSTIC CEREBRAL ANGIOGRAM MECHANICAL THROMBECTOMY FLAT PANEL HEAD CT LEFT CAROTID STENTING AND ANGIOPLASTY WITHOUT CEREBRAL PROTECTION DEVICE COMPARISON:  CT/CT angiogram of the head and neck June 05, 2023. MEDICATIONS: No antibiotics administered. ANESTHESIA/SEDATION: The procedure was performed under general anesthesia. CONTRAST:  80 mL of Omnipaque 300 milligram/mL FLUOROSCOPY: Radiation Exposure Index (as provided by the fluoroscopic device): 1315 mGy Kerma COMPLICATIONS: None immediate. TECHNIQUE: Informed written consent was obtained from the patient's wife after a thorough discussion of the procedural risks, benefits and alternatives. All questions were addressed. Maximal Sterile Barrier Technique was utilized including caps, mask, sterile gowns, sterile gloves, sterile drape, hand hygiene and skin antiseptic. A timeout was performed prior to the initiation of the procedure. The right groin was prepped and draped in the usual sterile fashion. Using a micropuncture kit and the modified Seldinger technique, access was gained to the right common femoral artery and an 8 French sheath was placed. Real-time ultrasound guidance was utilized for vascular access including the acquisition of a permanent ultrasound image documenting patency of the accessed vessel. Under fluoroscopy, an 8 [place] Walrus balloon guide catheter was navigated over a 6 [place] VTK catheter and a 0.035" Terumo Glidewire into the aortic arch. The catheter was placed into the left common carotid artery and then advanced into the left internal carotid artery. The diagnostic catheter was removed. Frontal and lateral angiograms of the head were obtained. FINDINGS: 1. Ultrasound showed heavily calcified right common femoral artery is  maintained patency and caliber. 2. Proximal occlusion of a left M2/MCA anterior division branch. 3. Atherosclerotic changes of the intracranial left ICA with mild stenosis at the distal cavernous segment. 4. 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 PROCEDURE: Using biplane roadmap guidance, a Red 62 aspiration catheter was navigated over Colossus 35 microguidewire into the cavernous segment of the left ICA. The aspiration catheter was then advanced to the level of occlusion and connected to an aspiration pump. Continuous aspiration was performed for 2 minutes. The guide catheter was connected to a VacLok syringe and the guiding catheter balloon was inflated. The aspiration catheter was subsequently removed under constant aspiration. The guide  catheter was aspirated for debris. Left internal carotid artery angiograms with frontal and lateral views of the head showed complete recanalization of the left MCA vascular tree. The guide catheter was retracted into the neck. Frontal and lateral angiograms of the neck were obtained. Improvement of the degree of stenosis in the carotid bulb compared to prior CT angiogram. There is prominent luminal irregularity at the carotid bulb residual moderate stenosis. Increased tortuosity of the proximal/mid cervical left ICA with kinking. Left internal carotid artery angiograms with left anterior oblique views of the neck showed evidence of filling defects at the level of the carotid bulb. Flat panel CT of the head was obtained and post processed in a separate workstation with concurrent attending physician supervision. Selected images were sent to PACS. No evidence of hemorrhagic complication. There is mild contrast staining of the left insular and frontal cortex. Repeat left internal carotid artery angiograms with frontal and lateral views of the head showed Amy seen left  M3/MCA branch to the left parietal region. Left common carotid artery angiograms with frontal and lateral views of the neck showed vertebra Gretchen of stenosis at the left ICA bulb with more prominent filling defect. At this point, patient was loaded on cangrelor followed by continuous drip. Using biplane roadmap guidance, a 4-7 mm Emboshield NAV6 cerebral protection device was advanced into the cervical left ICA. However, multiple attempts to advance a cerebral protection device through the left ICA kinking proved unsuccessful. The cerebral protection device was subsequently removed. Using biplane roadmap guidance, a 10-8 x 40 mm XACT carotid stent was navigated and deployed from the distal left common carotid artery to the proximal left internal carotid artery, proximal to the vessel kinking. Suboptimal stent expansion was noted. Then, a 6 x 30 mm Viatrac balloon was navigated into the recently deployed stent. Angioplasty was performed under fluoroscopy. Left internal carotid artery angiograms with frontal and lateral views of the neck showed adequate stent positioning and expansion with brisk anterograde flow. Left internal carotid artery angiograms with frontal and lateral views of the head showed improvement of anterograde flow in the left MCA vascular tree with brisk anterograde flow. Delayed left common carotid artery angiograms with frontal and lateral views of the neck showed no evidence of clot formation within the stent. The catheter was subsequently Riddle. Right common femoral artery angiogram was obtained in right anterior oblique view. The puncture is at the level of the common femoral artery. The artery has normal caliber, adequate for closure device. The sheath was exchanged over the wire for an 8 [place] Angio-Seal which was utilized for access closure. Immediate hemostasis was achieved. IMPRESSION: 1. Successful mechanical thrombectomy for treatment of a proximal left M2/MCA anterior division  branch occlusion achieving complete recanalization (TICI 3). 2. 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 PLAN: Continue cangrelor infusion until patient is transitioned to oral dual antiplatelet therapy. Electronically Signed   By: Baldemar Lenis M.D.   On: 06/05/2023 13:16   IR CT Head Ltd Result Date: 06/05/2023 INDICATION: 83 year old male presenting with right-sided weakness and aphasia; NIHSS 28. His last known well was 10 p.m. on 06/04/2023. His past medical history significant for prior stroke, hypertension, diabetes and chronic kidney disease; baseline modified Rankin scale 0. Head CT showed hypodensity within the left insula, basal ganglia and left frontal operculum (ASPECTS 7). No IV thrombolytic given as patient was outside the  window. CT angiogram of the head and neck showed an occlusion of a left M2/MCA anterior division branch. CT perfusion showed a 41 mL core infarct with a 45 mL ischemic penumbra. She was transferred to our service for mechanical thrombectomy. EXAM: ULTRASOUND-GUIDED VASCULAR ACCESS DIAGNOSTIC CEREBRAL ANGIOGRAM MECHANICAL THROMBECTOMY FLAT PANEL HEAD CT LEFT CAROTID STENTING AND ANGIOPLASTY WITHOUT CEREBRAL PROTECTION DEVICE COMPARISON:  CT/CT angiogram of the head and neck June 05, 2023. MEDICATIONS: No antibiotics administered. ANESTHESIA/SEDATION: The procedure was performed under general anesthesia. CONTRAST:  80 mL of Omnipaque 300 milligram/mL FLUOROSCOPY: Radiation Exposure Index (as provided by the fluoroscopic device): 1315 mGy Kerma COMPLICATIONS: None immediate. TECHNIQUE: Informed written consent was obtained from the patient's wife after a thorough discussion of the procedural risks, benefits and alternatives. All questions were addressed. Maximal Sterile Barrier Technique was utilized including caps,  mask, sterile gowns, sterile gloves, sterile drape, hand hygiene and skin antiseptic. A timeout was performed prior to the initiation of the procedure. The right groin was prepped and draped in the usual sterile fashion. Using a micropuncture kit and the modified Seldinger technique, access was gained to the right common femoral artery and an 8 French sheath was placed. Real-time ultrasound guidance was utilized for vascular access including the acquisition of a permanent ultrasound image documenting patency of the accessed vessel. Under fluoroscopy, an 8 [place] Walrus balloon guide catheter was navigated over a 6 [place] VTK catheter and a 0.035" Terumo Glidewire into the aortic arch. The catheter was placed into the left common carotid artery and then advanced into the left internal carotid artery. The diagnostic catheter was removed. Frontal and lateral angiograms of the head were obtained. FINDINGS: 1. Ultrasound showed heavily calcified right common femoral artery is maintained patency and caliber. 2. Proximal occlusion of a left M2/MCA anterior division branch. 3. Atherosclerotic changes of the intracranial left ICA with mild stenosis at the distal cavernous segment. 4. 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 PROCEDURE: Using biplane roadmap guidance, a Red 62 aspiration catheter was navigated over Colossus 35 microguidewire into the cavernous segment of the left ICA. The aspiration catheter was then advanced to the level of occlusion and connected to an aspiration pump. Continuous aspiration was performed for 2 minutes. The guide catheter was connected to a VacLok syringe and the guiding catheter balloon was inflated. The aspiration catheter was subsequently removed under constant aspiration. The guide catheter was aspirated for debris. Left internal carotid artery angiograms with frontal and lateral  views of the head showed complete recanalization of the left MCA vascular tree. The guide catheter was retracted into the neck. Frontal and lateral angiograms of the neck were obtained. Improvement of the degree of stenosis in the carotid bulb compared to prior CT angiogram. There is prominent luminal irregularity at the carotid bulb residual moderate stenosis. Increased tortuosity of the proximal/mid cervical left ICA with kinking. Left internal carotid artery angiograms with left anterior oblique views of the neck showed evidence of filling defects at the level of the carotid bulb. Flat panel CT of the head was obtained and post processed in a separate workstation with concurrent attending physician supervision. Selected images were sent to PACS. No evidence of hemorrhagic complication. There is mild contrast staining of the left insular and frontal cortex. Repeat left internal carotid artery angiograms with frontal and lateral views of the head showed Amy seen left M3/MCA branch to the left parietal  region. Left common carotid artery angiograms with frontal and lateral views of the neck showed vertebra Gretchen of stenosis at the left ICA bulb with more prominent filling defect. At this point, patient was loaded on cangrelor followed by continuous drip. Using biplane roadmap guidance, a 4-7 mm Emboshield NAV6 cerebral protection device was advanced into the cervical left ICA. However, multiple attempts to advance a cerebral protection device through the left ICA kinking proved unsuccessful. The cerebral protection device was subsequently removed. Using biplane roadmap guidance, a 10-8 x 40 mm XACT carotid stent was navigated and deployed from the distal left common carotid artery to the proximal left internal carotid artery, proximal to the vessel kinking. Suboptimal stent expansion was noted. Then, a 6 x 30 mm Viatrac balloon was navigated into the recently deployed stent. Angioplasty was performed under  fluoroscopy. Left internal carotid artery angiograms with frontal and lateral views of the neck showed adequate stent positioning and expansion with brisk anterograde flow. Left internal carotid artery angiograms with frontal and lateral views of the head showed improvement of anterograde flow in the left MCA vascular tree with brisk anterograde flow. Delayed left common carotid artery angiograms with frontal and lateral views of the neck showed no evidence of clot formation within the stent. The catheter was subsequently Riddle. Right common femoral artery angiogram was obtained in right anterior oblique view. The puncture is at the level of the common femoral artery. The artery has normal caliber, adequate for closure device. The sheath was exchanged over the wire for an 8 [place] Angio-Seal which was utilized for access closure. Immediate hemostasis was achieved. IMPRESSION: 1. Successful mechanical thrombectomy for treatment of a proximal left M2/MCA anterior division branch occlusion achieving complete recanalization (TICI 3). 2. 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 PLAN: Continue cangrelor infusion until patient is transitioned to oral dual antiplatelet therapy. Electronically Signed   By: Baldemar Lenis M.D.   On: 06/05/2023 13:16   CT ANGIO HEAD NECK W WO CM W PERF (CODE STROKE) Addendum Date: 06/05/2023 ADDENDUM REPORT: 06/05/2023 12:25 ADDENDUM: Please note, there is a dictation error within CTA neck impression #1, which should read: The common carotid and internal carotid arteries are patent within the neck. Atherosclerotic plaque bilaterally. Most notably, there is progressive atherosclerotic plaque about the left carotid bifurcation and within the proximal left ICA with resultant severe near occlusive stenosis of the proximal left ICA.  Also of note, atherosclerotic plaque about the right carotid bifurcation results in a 40% stenosis at the right ICA origin. Electronically Signed   By: Jackey Loge D.O.   On: 06/05/2023 12:25   Result Date: 06/05/2023 CLINICAL DATA:  Provided history: Cerebrovascular accident, unspecified mechanism. Right-sided weakness. Right-sided facial droop. Altered mental status. EXAM: CT ANGIOGRAPHY HEAD AND NECK CT PERFUSION BRAIN TECHNIQUE: Multidetector CT imaging of the head and neck was performed using the standard protocol during bolus administration of intravenous contrast. Multiplanar CT image reconstructions and MIPs were obtained to evaluate the vascular anatomy. Carotid stenosis measurements (when applicable) are obtained utilizing NASCET criteria, using the distal internal carotid diameter as the denominator. Multiphase CT imaging of the brain was performed following IV bolus contrast injection. Subsequent parametric perfusion maps were calculated using RAPID software. RADIATION DOSE REDUCTION: This exam was performed according to the departmental dose-optimization program which includes automated exposure control, adjustment of the mA and/or kV according to  patient size and/or use of iterative reconstruction technique. CONTRAST:  OMNIPAQUE IOHEXOL 350 MG/ML SOLN COMPARISON:  Noncontrast head CT performed earlier today 06/05/2023. MRA head and MRA neck 10/04/2019. FINDINGS: CTA NECK FINDINGS Aortic arch: Common origin of the innominate and left common carotid arteries. Atherosclerotic plaque within the visualized thoracic aorta and proximal major branch vessels of the neck. Streak/beam hardening artifact arising from a dense right-sided contrast bolus partially obscures the right subclavian artery. Within this limitation, there is no appreciable hemodynamically significant innominate or proximal subclavian artery stenosis. Right carotid system: CCA and ICA patent within the neck. Atherosclerotic plaque,  greatest about the carotid bifurcation. Resultant 40% stenosis at the ICA origin. Left carotid system: CCA and ICA patent within the neck. Atherosclerotic plaque. Most notably, there is prominent atherosclerotic plaque about the carotid bifurcation and within the proximal ICA which has progressed from the prior MRA neck of 10/04/2019. Resultant severe (near occlusive) stenosis of the proximal ICA. Tortuosity of the cervical ICA Vertebral arteries: The vertebral arteries are patent within the neck. Streak/beam hardening artifact limits evaluation of the right vertebral artery origin. At least moderate stenosis is suspected at this site. Atherosclerotic plaque scattered elsewhere within the cervical right vertebral artery with no more than mild stenosis. Calcified atherosclerotic plaque at the left vertebral artery origin with suspected at least moderate stenosis. Nonstenotic calcified plaque elsewhere within the cervical left vertebral artery. Skeleton: Cervical spondylosis. Other neck: No neck mass or cervical lymphadenopathy. Upper chest: No consolidation within the imaged lung apices. Review of the MIP images confirms the above findings CTA HEAD FINDINGS Anterior circulation: The intracranial internal carotid arteries are patent. As sclerotic plaque within both vessels. No more than mild stenosis on the right. Up to moderate stenosis within the left cavernous segment. The M1 middle cerebral arteries are patent. Abrupt occlusion of a proximal M2 left middle cerebral artery vessel (series 11, image 22). Atherosclerotic irregularity of the M2 and more distal MCA vessels elsewhere. The anterior cerebral arteries are patent. Atherosclerotic irregularity of both vessels without high-grade proximal stenosis. A possible 2 mm periophthalmic left ICA aneurysm with better appreciated on the prior MRA head of 10/04/2019. Posterior circulation: The intracranial vertebral arteries are patent. Atherosclerotic plaque within the  right V4 segment sites of mild stenosis. Non-stenotic atherosclerotic plaque within the left V4 segment. The basilar artery is patent. The posterior cerebral arteries are patent. Posterior communicating arteries are diminutive or absent, bilaterally. Venous sinuses: Assessment for dural venous sinus thrombosis is limited due to contrast timing. Anatomic variants: As described. Review of the MIP images confirms the above findings CT Brain Perfusion Findings: ASPECTS: CBF (<30%) Volume: 41mL Perfusion (Tmax>6.0s) volume: 86mL Mismatch Volume: 45mL Infarction Location:Left MCA vascular territory CTA head impression #1, the CT perfusion head impression and the presence of a severe stenosis of the proximal cervical left ICA called by telephone at the time of interpretation on 06/05/2023 at 8:40 am to provider ERIC [facility] , who verbally acknowledged these results. IMPRESSION: CTA neck: 1. No common carotid and internal carotid arteries are patent within the neck. Atherosclerotic plaque bilaterally. Most notably, there is progressive atherosclerotic plaque about the left carotid bifurcation and within the proximal left ICA with resultant severe, near occlusive stenosis of the proximal left ICA. Also of note, atherosclerotic plaque about the right carotid bifurcation results in 40% stenosis at the right ICA origin. 2. The vertebral arteries are patent within the neck. Atherosclerotic plaque bilaterally as described. Most notably, there is suspected at least moderate  stenoses at the bilateral vertebral artery origins. 3. Aortic Atherosclerosis (ICD10-I70.0). CTA head: 1. Abrupt occlusion of a proximal M2 left middle cerebral artery vessel. 2. Background intracranial atherosclerotic disease as described. 3. A possible 2 mm periophthalmic left ICA aneurysm was better appreciated on the prior MRA head of 10/04/2019. CT perfusion head: The perfusion software identifies a 41 mL core infarct in the left MCA vascular territory. The  perfusion software identifies an 86 mL region of critically hypoperfused parenchyma within the left MCA vascular territory (utilizing the Tmax>6 seconds threshold). Reported mismatch volume: 45 mL Electronically Signed: By: Jackey Loge D.O. On: 06/05/2023 09:07   CT HEAD CODE STROKE WO CONTRAST Result Date: 06/05/2023 CLINICAL DATA:  Code stroke. Neuro deficit, acute, stroke suspected. EXAM: CT HEAD WITHOUT CONTRAST TECHNIQUE: Contiguous axial images were obtained from the base of the skull through the vertex without intravenous contrast. RADIATION DOSE REDUCTION: This exam was performed according to the departmental dose-optimization program which includes automated exposure control, adjustment of the mA and/or kV according to patient size and/or use of iterative reconstruction technique. COMPARISON:  Brain MRI 10/04/2019.  Noncontrast head CT 10/03/2019. FINDINGS: Brain: Generalized cerebral atrophy. Loss of gray-white differentiation consistent with an acute infarct within the left insula and within portions of the left frontal operculum (MCA vascular territory). Known small chronic cortically-based infarcts within the left frontal, left parietal and left occipital lobes were better appreciated on the prior brain MRI of 10/04/2019 (acute at that time). Mild patchy and ill-defined hypoattenuation within the cerebral white matter, nonspecific but compatible with chronic small vessel ischemic disease. Subcentimeter infarct within the superior right cerebellar hemisphere, new from the prior MRI but chronic in appearance. Loss of gray-white differentiation there is no acute intracranial hemorrhage. No extra-axial fluid collection. No evidence of an intracranial mass. No midline shift. Vascular: No hyperdense vessel.  Atherosclerotic calcifications. Skull: No calvarial fracture or aggressive osseous lesion. Sinuses/Orbits: No mass or acute finding within the imaged orbits. No significant paranasal sinus disease.  ASPECTS [facility] Stroke Program Early CT Score) - Ganglionic level infarction (caudate, lentiform nuclei, internal capsule, insula, M1-M3 cortex): 5 - Supraganglionic infarction (M4-M6 cortex): 2 Total score (0-10 with 10 being normal): 7 Impression #1 called by telephone at the time of interpretation on 06/05/2023 at 8:40 am to provider Dr. Otelia Limes, who verbally acknowledged these results. IMPRESSION: 1. Acute left MCA territory infarct affecting the left insula and portions of the left frontal operculum. ASPECTS is 7. 2. Known small chronic cortically-based infarcts within the left frontal, left parietal and left occipital lobes were better appreciated on the prior brain MRI of 10/04/2019 (acute at that time). 3. Background mild cerebral white matter chronic small vessel ischemic disease. 4. Subcentimeter infarct within the right cerebellar hemisphere, new from prior MRI but chronic in appearance. 5. Generalized cerebral atrophy. Electronically Signed   By: Jackey Loge D.O.   On: 06/05/2023 08:45     PHYSICAL EXAM  Temp:  [98 F (36.7 C)-99.2 F (37.3 C)] 98.7 F (37.1 C) (02/23 1200) Pulse Rate:  [57-90] 65 (02/23 1330) Resp:  [0-35] 24 (02/23 1330) BP: (76-146)/(20-109) 93/71 (02/23 1330) SpO2:  [90 %-99 %] 94 % (02/23 1330) FiO2 (%):  [30 %-50 %] 40 % (02/23 1208)  General -intubated elderly patient in no acute distress  Cardiovascular -regular rhythm on monitor  Neuro - (sedated) still intubated on vent, eyes closed and not open eyes on voice, with forced eye opening, eyes midline with mild upward gaze, not blinking  to visual threat bilaterally, pupils equal and round but small and sluggish, corneal reflexes present, cough and gag reflex present.  Patient does not respond to voice or follow commands.  No significant response to noxious stimuli and no spontaneous movement.   ASSESSMENT/PLAN [name]. is a 83 y.o. male with history of hypertension, diabetes, CKD 3, stroke  admitted for right-sided weakness numbness, aphasia, right facial droop, left gaze preference. No TNK given due to outside window.  Patient underwent mechanical thrombectomy with left ICA stenting.   2/15 in the morning patient had an episode of vomiting with coffee-ground emesis and subsequent tachycardia and tachypnea.  On evaluation this morning, he had increased work of breathing and was unable to maintain his SpO2 even on 100% O2 via nonrebreather.  Discussion was had with patient's wife and daughter, and family stated they would like to proceed with intubation and would like for patient to remain full code at this time.  CCM was consulted, and patient was transferred to ICU and intubated.  He did have an episode of aspiration during intubation and bronchoscopy was subsequently performed.  He was noted to be hypotensive after intubation, and Levophed and vasopressin were started.  Will hold Plavix for now given coffee-ground emesis.  Patient's creatinine was also noted to have worsened. Due to her worsening kidney function and need for CRRT, CCM will be primary on the patient.   Stroke:  left MCA infarct with left M2 occlusion and left ICA near occlusion s/p IR with TICI3 and left ICA stenting, likely secondary to large vessel disease source versus cardiomyopathy with low EF CT left MCA infarct CT head and neck left M2 occlusion, left ICA near occlusion, right ICA 43 stenosis, bilateral VA origin severe stenosis CTP 41/86 Status post IR with TICI3 and left ICA stenting MRI left MCA infarct at left insular and left frontal operculum, prominent and confluent petechial hemorrhagic transformation CT repeat 2/7 stable confluent petechial hemorrhage 2D Echo EF 30% LDL 107 HgbA1c 6.1 P2 Y12 = 73 UDS negative SCDs for VTE prophylaxis aspirin 81 mg daily and clopidogrel 75 mg daily prior to admission, now on ASA and Plavix with stable hemoglobin. Ongoing aggressive stroke risk factor management Therapy  recommendations:  SNF Disposition: Pending   History of stroke 10/08/2019 admitted for left MCA infarct due to right upper extremity weakness.  MRA head and neck showed left ICA 30% stenosis.  EF 45 to 50%.  LDL 105, A1c 5.6.  Recommended loop recorder at that time but only got 30-day CardioNet monitoring which was no A-fib.  Discharged on DAPT and Lipitor 80.  Respiratory failure Leukocytosis On 2/15, patient had episode of emesis with probable subsequent aspiration and went into respiratory distress with increased work of breathing unable to maintain SpO2 After discussion with family, patient intubated for airway protection and given respiratory failure Now on precedex and fentanyl Ventilator management per CCM - failed SBT 2/19 Fever Tmax 102.5 WBC 10.1--23.5--44.5--41.4--29.9--19.5--7.4--4.9--5.7 Now on cefepime-> Rocephin Off vancomycin  GI bleeding Acute blood loss anemia Patient had 2 episodes of coffee-ground emesis, likely stress ulcer Resume Plavix 2/18 Resume ASA 2/20  Hemoglobin 11.2-> 8.4->PRBC->8.8->8.1-> 7.6->8.3-> 8.6->7.6 Close monitoring  Cardiomyopathy CHF 09/2019 EF 45 to 50% Current admission EF 30% Cardiology on board, appreciate assistance On DAPT, Agree with GDMT (losartan, entresto, metoprolol and spironolactone) as BP and Cr tolerates  Diabetes HgbA1c 6.1 goal < 7.0 Hyperglycemia improved  Now off insulin drip CBG monitoring SSI  DM education and  close PCP follow up  History of hypertension, now hypotensive Stable now But requiring Levophed Off epi and vasopressin Long term BP goal normotensive  Hyperlipidemia Home meds: Lipitor 80 LDL 105, goal < 70 Now on lipitor 80 and Zetia  Continue statin and zetia at discharge  Dysphagia Poststroke dysphagia Speech on board N.p.o. now OG tube reinserted Start trickle feeds 2/18  AKI on CKD  Creatinine 1.78->3.41-> 4.61->4.07-> 3.85->3.66->4.19->5.66 --> 6.92 Baseline creatinine around  1.5 Renally dose medications as appropriate Avoid contrast Aggressive treat hypotension Nephrology has been consulted  Other Stroke Risk Factors Advanced age Obesity, Body mass index is 43.81 kg/m.   Other Active Problems Presyncopal episode with mild hypotension 2/14-suspect was due to straining to have a bowel movement, will prescribe as needed suppository  Hospital day # 18   This patient is critically ill due to L MCA stroke following IR procedure, respiratory distress and worsening kidney injury and at significant risk of neurological worsening, death form his stroke, respiratory failure and kidney failure due to worsening kidney function. This patient's care requires constant monitoring of vital signs, hemodynamics, respiratory and cardiac monitoring, review of multiple databases, neurological assessment, discussion with family, other specialists and medical decision making of high complexity. I spent 35 minutes of neurocritical care time in the care of this patient.    Windell Norfolk, MD  Neurology 06/23/2023 1:47 PM

## 2023-06-23 NOTE — Progress Notes (Signed)
     Brief progress note:   Chart reviewed including vitals signs, labs, and progress/consult notes. Updates received from Dr. Denese Killings and patient's RN. Plan is for time trial of CRRT.   Plan: - ongoing full scope - time for outcomes - PMT will continue to follow   Sherlean Foot, NP-C Palliative Medicine   Please call Palliative Medicine team phone with any questions [phone]. For individual providers please see AMION.   No charge

## 2023-06-23 NOTE — Progress Notes (Signed)
 NAME:  Nathan Doss., MRN:  [id number], DOB:  August 25, 1940, LOS: 18 ADMISSION DATE:  06/05/2023 CONSULTATION DATE:  06/05/2023 REFERRING MD:  Otelia Limes - Neuro, CHIEF COMPLAINT: Code Stroke, post-NIR   History of Present Illness:  83 year old man who presented to [facility] ED 2/5 via EMS for unresponsiveness and R-sided deficits. PMHx significant for HTN, HLD, prior CVA without residual deficits, T2DM, CKD stage IIIb, prostate CA, gout.  Patient presented to [facility] ED with significant R-sided deficits, aphasia and poor responsiveness. LKW 2200. Code Stroke initiated. On ED arrival, patient was afebrile with HR 86, BP 154/67, RR 20, SpO2 100%. Noted aphasia, R-sided hemiplegia, R-sided facial droop. Labs were notable for WBC 6.8. Hgb 11.9, Plt 181. INR 1.0. Na 142, K 3.8, CO2 20, Cr 1.59 (baseline), LFTs WNL. Ethanol < 10. COVID negative. CT Head with acute L MCA territory infarct affecting L insula/L frontal operculum, small chronic cortically-based infarcts of L frontal/L parietal/L occipital lobes. CTA Head/Neck with abrupt occlusion of proximal M2 L MCA vessel, ?2mm periophthalmic L ICA aneurysm, bilateral ICA plaque with progressive atherosclerotic plaque L carotid bifurcation with proximal L ICA severe, near-occlusive stenosis.   Taken to [facility] emergently for mechanical thrombectomy ([name]). TICI 3 revascularization achieved. L ICA stenosis and plaque rupture noted at the bulb, treated with stenting/angioplasty. Extubated post-procedure and maintained on cangrelor gtt with plan for transition to DAPT.  PCCM consulted for post-procedure medical management.  Pertinent Medical History:   Past Medical History:  Diagnosis Date   Anemia of chronic disease    Ankle fracture 02/15/2016   Cancer (HCC)    Prostate   Chronic constipation    Chronic kidney disease    stage III   Diabetes mellitus without complication (HCC)    type II    Diabetic peripheral neuropathy (HCC)    Failure to  thrive (0-17)    Fracture of left lower leg    Gout    Hyperlipidemia    Hypertension    Morbid obesity (HCC)    Unstable gait    Significant Hospital Events: Including procedures, antibiotic start and stop dates in addition to other pertinent events   2/5 - Presented to [facility] ED with poor responsiveness, aphasia, R-sided deficits. CT Head with L MCA territory infarct. CTA Head/Neck with occlusion of L MCA M2 segment and severe near-occlusive stenosis of L ICA. Taken to [facility] for MT, stent and angioplasty ([name]). PCCM consulted for post-procedure management. 2/6 transfer out of ICU 2/15 aspiration event early a.m. resulting in respiratory distress, transferred back to ICU and emergently intubated with development of severe shock requiring epi, levo, vaso. Transfused PRBC x1 for possible GI bleed 2/16: 3 pressors, weaning. Coox without cardiogenic component. Antibiotics vanc/cefepime. CT CAP with SBO  2/18: weaned off to only levo now which is likely sedation related  2/20: UOP drop overnight with ongoing poor renal function. Weaning sedation to evaluate neuro status.   Interim History / Subjective:  Poor HD catheter flow. Patient resedated overnight in attempt to get HD catheter to flow.   Objective:  Blood pressure (!) 91/51, pulse 66, temperature 99.2 F (37.3 C), temperature source Axillary, resp. rate (!) 26, height 5\' 10"  (1.778 m), weight (!) 138.5 kg, SpO2 95%.    Vent Mode: PCV FiO2 (%):  [30 %-50 %] 40 % Set Rate:  [22 bmp] 22 bmp Vt Set:  [570 mL] 570 mL PEEP:  [5 cmH20-8 cmH20] 8 cmH20 Plateau Pressure:  [19 cmH20-23 cmH20]  20 cmH20   Intake/Output Summary (Last 24 hours) at 06/23/2023 1106 Last data filed at 06/23/2023 0803 Gross per 24 hour  Intake 3870.62 ml  Output 2754.1 ml  Net 1116.52 ml   Filed Weights   06/19/23 0500 06/20/23 0500 06/22/23 0500  Weight: 133.4 kg 133 kg (!) 138.5 kg   Physical Exam: General: obese elderly man.  HENT: ETT and  SBFT in place.  Pulmonary: chest clear. Cardiovascular: HS normal with warm extremities.  GI: abdomen protuberant. Active bowel sounds. FMS in place.  Extremities: no pitting edema  Neuro: Sedated today. No response to voice or painful stimulation. GU: negligible urine in Foley.   Ancillary Tests Personally Reviewed:  Na 137 Creatinine 4.23, BUN down to 91 HB 7.6  Assessment & Plan:Resolved Hospital Problem List  Acute hypoxemic respiratory failure due to aspiration Klebsiella pneumonia  Ileus and swallowing dysfunction from stroke probably contributed.  Left MCA M2 occlusion and left ICA near occlusion now status post thrombectomy with complete revascularization Likely secondary to large vessel obstruction Heart failure with reduced ejection fraction ejection fraction of 30% with global hypokinesis, normal right ventricular function Hypertension Hyperlipidemia Type 2 diabetes Acute kidney injury superimposed on chronic kidney disease stage IIIb Hypernatremia  Hyperchloremia   Plan:  - HD catheter changed out to longer one. Should improve flow.   - Trial of CRRT to see if facilitates extubation as mental status is main barrier as patient has been tolerating SBT.  - Poor long-term HD candidate given poor LV function.  - Increased basal insulin - Plan is for initial 3 day trial of CRRT.  - Stop all sedation infusions. Intermittent fentanyl only - Palliative care is following.   Best Practice (right click and "Reselect all SmartList Selections" daily)   Diet/type: tubefeeds and NPO DVT prophylaxis SCD Pressure ulcer(s): N/A GI prophylaxis: PPI Lines: Central line and yes and it is still needed Foley:  Yes, and it is still needed Code Status:  full code Last date of multidisciplinary goals of care discussion: Continue to update patient and family daily. They are wanting full care at this time.   CRITICAL CARE Performed by: Lynnell Catalan   Total critical care time: 35  minutes  Critical care time was exclusive of separately billable procedures and treating other patients.  Critical care was necessary to treat or prevent imminent or life-threatening deterioration.  Critical care was time spent personally by me on the following activities: development of treatment plan with patient and/or surrogate as well as nursing, discussions with consultants, evaluation of patient's response to treatment, examination of patient, obtaining history from patient or surrogate, ordering and performing treatments and interventions, ordering and review of laboratory studies, ordering and review of radiographic studies, pulse oximetry, re-evaluation of patient's condition and participation in multidisciplinary rounds.  Lynnell Catalan, MD [facility] ICU Physician [facility]  Pager: [phone] Mobile: [phone] After hours: [phone].

## 2023-06-23 NOTE — Procedures (Signed)
 Central Venous Catheter Insertion Procedure Note  Nathan Proctor  [id number]  1941/04/12  Date:06/23/23  Time:11:04 AM   Provider Performing:Abeni Finchum   Procedure: Insertion of Non-tunneled Central Venous Catheter(36556) without US guidance  Indication(s) Hemodialysis  Consent Risks of the procedure as well as the alternatives and risks of each were explained to the patient and/or caregiver.  Consent for the procedure was obtained and is signed in the bedside chart  Anesthesia none  Timeout Verified patient identification, verified procedure, site/side was marked, verified correct patient position, special equipment/implants available, medications/allergies/relevant history reviewed, required imaging and test results available.  Sterile Technique Maximal sterile technique including full sterile barrier drape, hand hygiene, sterile gown, sterile gloves, mask, hair covering, sterile ultrasound probe cover (if used).  Procedure Description Area of catheter insertion was cleaned with chlorhexidine and draped in sterile fashion.  Guidewire exchange of a 15cm to a 20cm   HD catheter in right internal jugular vein. Nonpulsatile blood flow and easy flushing noted in all ports.  The catheter was sutured in place and sterile dressing applied.  Complications/Tolerance None; patient tolerated the procedure well. Chest X-ray is ordered to verify placement for internal jugular or subclavian cannulation.   Chest x-ray is not ordered for femoral cannulation.  EBL Minimal  Specimen(s) None  Lynnell Catalan, MD [facility] ICU Physician [facility]  Pager: [phone] Or Epic Secure Chat After hours: [phone].  06/23/2023, 11:06 AM

## 2023-06-23 NOTE — Progress Notes (Signed)
 Nephrology Follow-Up Consult note   Assessment/Recommendations: Nathan Dematteo. is a/an 83 y.o. male with a past medical history significant for CKD 3 AA, HTN, DM2, admitted for CVA complicated by sepsis and acute renal failure.       AKI on CKD 3A: Baseline creatinine 1.8.  Likely ATN in the setting of faction and shock requiring pressors.  Poor dialysis candidate overall but discussed with family and willing to try 2 to 3 days of CRRT to see if this improves his mental status -Likely needs new catheter, appreciate help from primary team -Restart CRRT when able and likely plan for 2 to 3 days and reassess neurological status -Continue to monitor daily Cr, Dose meds for GFR -Monitor Daily I/Os, Daily weight  -Maintain MAP>65 for optimal renal perfusion.  -Avoid nephrotoxic medications including NSAIDs -Use synthetic opioids (Fentanyl/Dilaudid) if needed  Acute hypoxic respiratory failure: Aspiration.  Ventilatory management per primary team  Klebsiella pneumonia: Antibiotics per primary team  CVA: Status post thrombectomy.  Mental status continues to be poor.  Continue to monitor  Heart failure with reduced ejection fraction: Manage volume status as above  Uncontrolled type 2 diabetes with hyperglycemia: Management per primary team  Anemia: Multifactorial.  Transfusions per primary team     Recommendations conveyed to primary service.    Darnell Level [facility] 06/23/2023 9:41 AM  ___________________________________________________________  CC: Stroke  Interval History/Subjective: Started CRRT yesterday.  Difficult overnight with frequent alarms and issues that sound like they are related to the catheter.  Lots of positional problems or coughing causing dysfunction.   Medications:  Current Facility-Administered Medications  Medication Dose Route Frequency Provider Last Rate Last Admin   0.9 %  sodium chloride infusion (Manually program via  Guardrails IV Fluids)   Intravenous Once Janeann Forehand D, NP   Held at 06/15/23 1900   acetaminophen (TYLENOL) tablet 650 mg  650 mg Oral Q4H PRN Lynnae January, NP   650 mg at 06/09/23 2307   Or   acetaminophen (TYLENOL) 160 MG/5ML solution 650 mg  650 mg Per Tube Q4H PRN Lynnae January, NP   650 mg at 06/20/23 0054   Or   acetaminophen (TYLENOL) suppository 650 mg  650 mg Rectal Q4H PRN Lynnae January, NP       aspirin chewable tablet 81 mg  81 mg Per Tube Daily Marvel Plan, MD   81 mg at 06/22/23 0946   atorvastatin (LIPITOR) tablet 80 mg  80 mg Per Tube Daily Marvel Plan, MD   80 mg at 06/22/23 0981   bisacodyl (DULCOLAX) suppository 10 mg  10 mg Rectal Daily Cristopher Peru, PA-C   10 mg at 06/20/23 1019   carvedilol (COREG) tablet 12.5 mg  12.5 mg Per Tube BID WC Mertha Baars E, PA-C   12.5 mg at 06/23/23 1914   Chlorhexidine Gluconate Cloth 2 % PADS 6 each  6 each Topical Daily Luciano Cutter, MD   6 each at 06/22/23 1352   clopidogrel (PLAVIX) tablet 75 mg  75 mg Per Tube Daily Mertha Baars E, PA-C   75 mg at 06/22/23 0946   dexmedetomidine (PRECEDEX) 400 MCG/100ML (4 mcg/mL) infusion  0-1.2 mcg/kg/hr Intravenous Titrated Henry Russel, MD 31.2 mL/hr at 06/23/23 0910 0.9 mcg/kg/hr at 06/23/23 0910   docusate (COLACE) 50 MG/5ML liquid 100 mg  100 mg Per Tube BID Mertha Baars E, PA-C   100 mg at 06/22/23 0946   ezetimibe (ZETIA) tablet 10 mg  10 mg Per Tube Daily Marvel Plan, MD   10 mg at 06/22/23 0947   feeding supplement (OSMOLITE 1.5 CAL) liquid 1,000 mL  1,000 mL Per Tube Continuous Lynnell Catalan, MD 50 mL/hr at 06/23/23 0800 Infusion Verify at 06/23/23 0800   feeding supplement (PROSource TF20) liquid 60 mL  60 mL Per Tube BID Lynnell Catalan, MD   60 mL at 06/22/23 2114   fentaNYL (SUBLIMAZE) injection 50-100 mcg  50-100 mcg Intravenous Q1H PRN Henry Russel, MD   50 mcg at 06/23/23 0443   heparin injection 1,000-6,000 Units  1,000-6,000 Units CRRT PRN Darnell Level, MD    3,000 Units at 06/23/23 0803   heparin injection 5,000 Units  5,000 Units Subcutaneous Q8H Mertha Baars E, PA-C   5,000 Units at 06/23/23 2956   insulin aspart (novoLOG) injection 0-20 Units  0-20 Units Subcutaneous Q4H Olalere, Adewale A, MD   7 Units at 06/23/23 0803   insulin glargine-yfgn (SEMGLEE) injection 20 Units  20 Units Subcutaneous BID Agarwala, Daleen Bo, MD       ipratropium-albuterol (DUONEB) 0.5-2.5 (3) MG/3ML nebulizer solution 3 mL  3 mL Nebulization Q6H PRN Ogan, Thomasene Lot, MD       leptospermum manuka honey (MEDIHONEY) paste 1 Application  1 Application Topical Daily Olalere, Adewale A, MD   1 Application at 06/22/23 [phone]   liver oil-zinc oxide (DESITIN) 40 % ointment   Topical BID Virl Diamond A, MD   Given at 06/22/23 2119   multivitamin (RENA-VIT) tablet 1 tablet  1 tablet Per Tube QHS Calton Dach I, RPH   1 tablet at 06/22/23 2114   norepinephrine (LEVOPHED) 4mg  in (0.016 mg/mL) premix infusion  0-40 mcg/min Intravenous Titrated Agarwala, Daleen Bo, MD 7.5 mL/hr at 06/23/23 0800 2 mcg/min at 06/23/23 0800   Oral care mouth rinse  15 mL Mouth Rinse PRN Opyd, Lavone Neri, MD       Oral care mouth rinse  15 mL Mouth Rinse Q2H Luciano Cutter, MD   15 mL at 06/23/23 0803   Oral care mouth rinse  15 mL Mouth Rinse PRN Luciano Cutter, MD       pantoprazole (PROTONIX) injection 40 mg  40 mg Intravenous Q12H Harris, Alphonzo Lemmings D, NP   40 mg at 06/22/23 2114   polyethylene glycol (MIRALAX / GLYCOLAX) packet 17 g  17 g Per Tube Daily PRN Luciano Cutter, MD   17 g at 06/17/23 2208   polyethylene glycol (MIRALAX / GLYCOLAX) packet 17 g  17 g Per Tube BID Cristopher Peru, PA-C   17 g at 06/22/23 8657   prismasol BGK 4/2.5 infusion   CRRT Continuous Darnell Level, MD 400 mL/hr at 06/23/23 0236 New Bag at 06/23/23 0236   prismasol BGK 4/2.5 infusion   CRRT Continuous Darnell Level, MD 400 mL/hr at 06/23/23 0236 New Bag at 06/23/23 0236   prismasol BGK 4/2.5 infusion    CRRT Continuous Darnell Level, MD 1,500 mL/hr at 06/23/23 0358 New Bag at 06/23/23 0358   senna-docusate (Senokot-S) tablet 1 tablet  1 tablet Per Tube BID Cristopher Peru, PA-C   1 tablet at 06/21/23 1046   sodium chloride 0.9 % primer fluid for CRRT   CRRT PRN Darnell Level, MD       sodium chloride flush (NS) 0.9 % injection 3 mL  3 mL Intravenous Once Elayne Snare K, DO          Review of Systems: Unable  to obtain due to the patient's mental status  Physical Exam: Vitals:   06/23/23 0800 06/23/23 0815  BP: (!) 96/55 (!) 91/51  Pulse: 66 66  Resp: (!) 25 (!) 26  Temp: 99.2 F (37.3 C)   SpO2: 96% 95%   Total I/O In: 489.1 [I.V.:89.1; NG/GT:400] Out: -   Intake/Output Summary (Last 24 hours) at 06/23/2023 0941 Last data filed at 06/23/2023 0803 Gross per 24 hour  Intake 3870.62 ml  Output 2754.1 ml  Net 1116.52 ml   Constitutional: Ill-appearing, lying in bed, no distress ENMT: ears and nose without scars or lesions, MMM CV: normal rate, dependent edema, some nonpitting edema Respiratory: Coarse upper airway sounds, normal work of breathing Gastrointestinal: soft, non-tender, no palpable masses or hernias Skin: no visible lesions or rashes Psych: Awake but not interactive   Test Results I personally reviewed new and old clinical labs and radiology tests Lab Results  Component Value Date   NA 137 06/23/2023   K 3.9 06/23/2023   CL 104 06/23/2023   CO2 21 (L) 06/23/2023   BUN 91 (H) 06/23/2023   CREATININE 4.23 (H) 06/23/2023   GLU 141 03/06/2016   CALCIUM 8.3 (L) 06/23/2023   ALBUMIN 1.9 (L) 06/23/2023   PHOS 3.1 06/23/2023    CBC Recent Labs  Lab 06/19/23 1544 06/20/23 0447 06/21/23 0602  WBC 4.9 5.7 7.3  HGB 8.3* 8.6* 7.6*  HCT 27.1* 28.0* 25.3*  MCV 100.0 100.7* 103.3*  PLT 115* 113* 110*

## 2023-06-24 ENCOUNTER — Inpatient Hospital Stay (HOSPITAL_COMMUNITY): Payer: No Typology Code available for payment source

## 2023-06-24 DIAGNOSIS — I5043 Acute on chronic combined systolic (congestive) and diastolic (congestive) heart failure: Secondary | ICD-10-CM | POA: Diagnosis not present

## 2023-06-24 DIAGNOSIS — J9601 Acute respiratory failure with hypoxia: Secondary | ICD-10-CM | POA: Diagnosis not present

## 2023-06-24 DIAGNOSIS — E440 Moderate protein-calorie malnutrition: Secondary | ICD-10-CM

## 2023-06-24 DIAGNOSIS — Z515 Encounter for palliative care: Secondary | ICD-10-CM | POA: Diagnosis not present

## 2023-06-24 DIAGNOSIS — I639 Cerebral infarction, unspecified: Secondary | ICD-10-CM | POA: Diagnosis not present

## 2023-06-24 DIAGNOSIS — K567 Ileus, unspecified: Secondary | ICD-10-CM | POA: Diagnosis not present

## 2023-06-24 DIAGNOSIS — J150 Pneumonia due to Klebsiella pneumoniae: Secondary | ICD-10-CM | POA: Diagnosis not present

## 2023-06-24 DIAGNOSIS — Z7189 Other specified counseling: Secondary | ICD-10-CM | POA: Diagnosis not present

## 2023-06-24 LAB — RENAL FUNCTION PANEL
Albumin: 2 g/dL — ABNORMAL LOW (ref 3.5–5.0)
Albumin: 2.1 g/dL — ABNORMAL LOW (ref 3.5–5.0)
Anion gap: 10 (ref 5–15)
Anion gap: 8 (ref 5–15)
BUN: 54 mg/dL — ABNORMAL HIGH (ref 8–23)
BUN: 50 mg/dL — ABNORMAL HIGH (ref 8–23)
CO2: 24 mmol/L (ref 22–32)
CO2: 23 mmol/L (ref 22–32)
Calcium: 8.7 mg/dL — ABNORMAL LOW (ref 8.9–10.3)
Calcium: 8.4 mg/dL — ABNORMAL LOW (ref 8.9–10.3)
Chloride: 102 mmol/L (ref 98–111)
Chloride: 103 mmol/L (ref 98–111)
Creatinine, Ser: 2.8 mg/dL — ABNORMAL HIGH (ref 0.61–1.24)
Creatinine, Ser: 2.47 mg/dL — ABNORMAL HIGH (ref 0.61–1.24)
GFR, Estimated: 22 mL/min — ABNORMAL LOW (ref >60)
GFR, Estimated: 25 mL/min — ABNORMAL LOW (ref >60)
Glucose, Bld: 252 mg/dL — ABNORMAL HIGH (ref 70–99)
Glucose, Bld: 355 mg/dL — ABNORMAL HIGH (ref 70–99)
Phosphorus: 3 mg/dL (ref 2.5–4.6)
Phosphorus: 3.2 mg/dL (ref 2.5–4.6)
Potassium: 4.2 mmol/L (ref 3.5–5.1)
Potassium: 4.9 mmol/L (ref 3.5–5.1)
Sodium: 136 mmol/L (ref 135–145)
Sodium: 134 mmol/L — ABNORMAL LOW (ref 135–145)

## 2023-06-24 LAB — POCT I-STAT 7, (LYTES, BLD GAS, ICA,H+H)
Acid-Base Excess: 0 mmol/L (ref 0.0–2.0)
Bicarbonate: 25.7 mmol/L (ref 20.0–28.0)
Calcium, Ion: 1.27 mmol/L (ref 1.15–1.40)
HCT: 24 % — ABNORMAL LOW (ref 39.0–52.0)
Hemoglobin: 8.2 g/dL — ABNORMAL LOW (ref 13.0–17.0)
O2 Saturation: 89 %
Patient temperature: 98.3
Potassium: 4.7 mmol/L (ref 3.5–5.1)
Sodium: 137 mmol/L (ref 135–145)
TCO2: 27 mmol/L (ref 22–32)
pCO2 arterial: 45.2 mmHg (ref 32–48)
pH, Arterial: 7.362 (ref 7.35–7.45)
pO2, Arterial: 58 mmHg — ABNORMAL LOW (ref 83–108)

## 2023-06-24 LAB — GLUCOSE, CAPILLARY
Glucose-Capillary: 241 mg/dL — ABNORMAL HIGH (ref 70–99)
Glucose-Capillary: 260 mg/dL — ABNORMAL HIGH (ref 70–99)
Glucose-Capillary: 295 mg/dL — ABNORMAL HIGH (ref 70–99)
Glucose-Capillary: 337 mg/dL — ABNORMAL HIGH (ref 70–99)
Glucose-Capillary: 256 mg/dL — ABNORMAL HIGH (ref 70–99)
Glucose-Capillary: 145 mg/dL — ABNORMAL HIGH (ref 70–99)

## 2023-06-24 LAB — MAGNESIUM: Magnesium: 2.4 mg/dL (ref 1.7–2.4)

## 2023-06-24 MED ORDER — INSULIN ASPART 100 UNIT/ML IJ SOLN
3.0000 [IU] | INTRAMUSCULAR | Status: DC
  Administered 2023-06-24 – 2023-06-26 (×12): 3 [IU] via SUBCUTANEOUS

## 2023-06-24 MED ORDER — QUETIAPINE FUMARATE 25 MG PO TABS
12.5000 mg | ORAL_TABLET | Freq: Two times a day (BID) | ORAL | Status: DC
  Administered 2023-06-24 – 2023-06-25 (×3): 12.5 mg
  Filled 2023-06-24 (×3): qty 1

## 2023-06-24 MED ORDER — MIDAZOLAM HCL 2 MG/2ML IJ SOLN
1.0000 mg | INTRAMUSCULAR | Status: DC | PRN
  Administered 2023-06-24 – 2023-06-27 (×14): 2 mg via INTRAVENOUS
  Filled 2023-06-24 (×15): qty 2

## 2023-06-24 MED ORDER — INSULIN GLARGINE 100 UNIT/ML ~~LOC~~ SOLN
20.0000 [IU] | Freq: Two times a day (BID) | SUBCUTANEOUS | Status: DC
  Administered 2023-06-25 – 2023-06-26 (×4): 20 [IU] via SUBCUTANEOUS
  Filled 2023-06-24 (×6): qty 0.2

## 2023-06-24 MED ORDER — FENTANYL 2500MCG IN NS 250ML (10MCG/ML) PREMIX INFUSION
0.0000 ug/h | INTRAVENOUS | Status: DC
  Administered 2023-06-24: 25 ug/h via INTRAVENOUS
  Administered 2023-06-25: 175 ug/h via INTRAVENOUS
  Administered 2023-06-25: 250 ug/h via INTRAVENOUS
  Administered 2023-06-26: 125 ug/h via INTRAVENOUS
  Administered 2023-06-26: 175 ug/h via INTRAVENOUS
  Administered 2023-06-27 (×2): 200 ug/h via INTRAVENOUS
  Administered 2023-06-27 – 2023-06-28 (×2): 225 ug/h via INTRAVENOUS
  Administered 2023-06-28: 250 ug/h via INTRAVENOUS
  Administered 2023-06-29 – 2023-06-30 (×3): 200 ug/h via INTRAVENOUS
  Administered 2023-07-01: 125 ug/h via INTRAVENOUS
  Administered 2023-07-02 – 2023-07-05 (×3): 75 ug/h via INTRAVENOUS
  Filled 2023-06-24 (×18): qty 250

## 2023-06-24 NOTE — Progress Notes (Signed)
 [facility] Progress Note    Assessment/ Plan:   AKI on CKD 3A: Baseline creatinine 1.8.  Likely ATN in the setting of low EF and shock requiring pressors.   -CRRT start 2/22-2/23 however poor flows/catheter issues. R/s 2/23 with new catheter (appreciate CCM's assistance) -overall poor dialysis candidate given currently clinical situation, plan was to trial 2-3 days of RRT to see if there is any improvement in mental status/able to liberate from vent. Continue with CRRT, increase UF goals to net neg 100cc/hr which ICU RN will trial -Continue to monitor daily Cr, Dose meds for GFR -Monitor Daily I/Os, Daily weight  -Maintain MAP>65 for optimal renal perfusion.  -Avoid nephrotoxic medications including NSAIDs -Use synthetic opioids (Fentanyl/Dilaudid) if needed   Acute hypoxic respiratory failure: Aspiration.  Ventilatory management per primary team   Klebsiella pneumonia: Antibiotics per primary team   CVA: Status post thrombectomy.  Mental status continues to be poor.  Continue to monitor   Heart failure with reduced ejection fraction: Manage volume status as above, increasing uf goals   Uncontrolled type 2 diabetes with hyperglycemia: Management per primary team   Anemia: Multifactorial.  Transfusions per primary team  Shock: pressor support per CCM  Discussed with ICU RN, granddaughter at bedside, and family over granddaughter's speakerphone. Questions/concerns addressed  Subjective:   Patient seen and examined on CRRT. New catheter working much better. Stable NE requirements. Vent being weaned. Possibly some improvement in mental status I.e. opening eyes per ICU RN.  Granddaughter at bedside.   Objective:   BP (!) 110/47   Pulse 61   Temp (!) 96.3 F (35.7 C) (Axillary) Comment: Reported to the nurse, warm blankets applied.  Resp (!) 25   Ht 5\' 10"  (1.778 m)   Wt (!) 137.7 kg   SpO2 95%   BMI 43.56 kg/m   Intake/Output Summary (Last 24 hours) at  06/24/2023 1610 Last data filed at 06/24/2023 0900 Gross per 24 hour  Intake 2952.05 ml  Output 3575.8 ml  Net -623.75 ml   Weight change:   Physical Exam: Gen: ill appearing, on vent/sedated CVS: RRR Resp: CTA B/L, intubated Abd: soft, nt/nd Ext: trace edema LE Neuro: not following commands, unresp Dialysis access: right internal jugular temp HD catheter  Imaging: DG CHEST PORT 1 VIEW Result Date: 06/23/2023 CLINICAL DATA:  Central line placement EXAM: PORTABLE CHEST 1 VIEW COMPARISON:  06/22/2023 FINDINGS: RIGHT central venous line with tip in distal SVC. LEFT central venous line with tip in the mid SVC. Endotracheal tube and feeding tube unchanged. No pneumothorax. Bilateral small effusions. Low lung volumes. IMPRESSION: 1. Bilateral central venous lines without complication. 2. Low lung volumes and small effusions. Electronically Signed   By: Genevive Bi M.D.   On: 06/23/2023 10:10   DG Chest Port 1 View Result Date: 06/22/2023 CLINICAL DATA:  Central line placement. EXAM: PORTABLE CHEST 1 VIEW COMPARISON:  June 19, 2023. FINDINGS: Stable cardiomediastinal silhouette. Hypoinflation of the lungs is noted with bibasilar subsegmental atelectasis and probable small pleural effusions. Endotracheal tube and feeding tube are unchanged. Left internal jugular catheter is unchanged. Interval placement of right internal jugular catheter with distal tip in expected position of the SVC. No pneumothorax. IMPRESSION: Interval placement of right internal jugular catheter with distal tip in expected position of the SVC. Electronically Signed   By: Lupita Raider M.D.   On: 06/22/2023 11:36    Labs: [employer] Recent Labs  Lab 06/20/23 [phone] 06/20/23 5409 06/21/23 0602 06/22/23 8119 06/22/23  1608 06/23/23 0509 06/23/23 1623 06/24/23 0520  NA 149*  --  148* 144 143 137 137 136  K 3.8  --  3.9 3.8 3.9 3.9 4.1 4.2  CL 114*  --  114* 111 111 104 104 102  CO2 21*  --  20* 20* 22 21* 23 24   GLUCOSE 186*  --  227* 315* 289* 250* 350* 252*  BUN 102*  --  131* 154* 133* 91* 79* 54*  CREATININE 4.19*  --  5.66* 6.92* 5.83* 4.23* 3.68* 2.80*  CALCIUM 8.9  --  9.0 8.8* 8.4* 8.3* 8.1* 8.7*  PHOS  --  4.6 5.4*  --  4.3 3.1 2.8 3.0   CBC Recent Labs  Lab 06/19/23 0031 06/19/23 1544 06/20/23 0447 06/21/23 0602  WBC 7.4 4.9 5.7 7.3  HGB 7.7* 8.3* 8.6* 7.6*  HCT 25.2* 27.1* 28.0* 25.3*  MCV 100.8* 100.0 100.7* 103.3*  PLT 118* 115* 113* 110*    Medications:     sodium chloride   Intravenous Once   aspirin  81 mg Per Tube Daily   atorvastatin  80 mg Per Tube Daily   bisacodyl  10 mg Rectal Daily   carvedilol  12.5 mg Per Tube BID WC   Chlorhexidine Gluconate Cloth  6 each Topical Daily   clopidogrel  75 mg Per Tube Daily   ezetimibe  10 mg Per Tube Daily   feeding supplement (PROSource TF20)  60 mL Per Tube BID   heparin injection (subcutaneous)  5,000 Units Subcutaneous Q8H   insulin aspart  0-20 Units Subcutaneous Q4H   insulin glargine-yfgn  20 Units Subcutaneous BID   leptospermum manuka honey  1 Application Topical Daily   liver oil-zinc oxide   Topical BID   multivitamin  1 tablet Per Tube QHS   mouth rinse  15 mL Mouth Rinse Q2H   pantoprazole (PROTONIX) IV  40 mg Intravenous Q12H   polyethylene glycol  17 g Per Tube BID   senna-docusate  1 tablet Per Tube BID   sodium chloride flush  3 mL Intravenous Once      Anthony Sar, MD [facility] 06/24/2023, 9:49 AM

## 2023-06-24 NOTE — Progress Notes (Addendum)
 eLink Physician-Brief Progress Note Patient Name: Nathan Proctor. DOB: 08-31-1940 MRN: [id number]   Date of Service  06/24/2023  HPI/Events of Note  Notified that patient is tachypenic with RR in the 30s-40s with audible air leak.   The patient is saturating at 94% and he is getting his lung volumes.   eICU Interventions  Adjust sedation appropriately.  Get CXR to ensure adequate placement of the ETT.       Intervention Category Intermediate Interventions: Other:  Larinda Buttery 06/24/2023, 8:44 PM  11:35 PM CXR shows the tip of the ETT 8.5cm above the carina.   Plan> Advance ETT by atleast 3cm.   Get ABG.  4:40 AM RN reports that he has been unable to pull any volume with CRRT tonight.  Levophed has been restarted.   ABG 7.362/45.2/58 earlier.  Pt is now saturating at 97%.  CXR shows the ETT to be 7cm above the carina.  No air leak noted now.  Plan> Ok to give albumin.

## 2023-06-24 NOTE — Progress Notes (Signed)
 E-link notified that chest x-ray is in. E-link notified that per respiratory therapist, patient is not getting their ventilator volumes.

## 2023-06-24 NOTE — Progress Notes (Signed)
 Daily Progress Note   Patient Name: Nathan Proctor.       Date: 06/24/2023 DOB: June 09, 1940  Age: 83 y.o. MRN#: [id number] Attending Physician: Lynnell Catalan, MD Primary Care Physician: Fleet Contras, MD Admit Date: 06/05/2023 Length of Stay: 19 days  Reason for Consultation/Follow-up: Establishing goals of care  HPI/Patient Profile:  83 y.o. male with past medical history of HTN, HLD, diabetes, CKD stage 3b, prior CVA with no residual deficits, prostate cancer, failure to thrive admitted on 06/05/2023 with unresponsiveness with right-sided deficits found to have left MCA infarct with left M2 occlusion and left ICA near occlusion s/p IR with mechanical thrombectomy and stenting 2/5. 2/15 aspiration event resulting in severe shock requiring intubation. Hospitalization also complicated by acute GIB and CHF EF 30%.   Subjective:   Subjective: Chart Reviewed. Updates received. Patient Assessed. Created space and opportunity for patient  and family to explore thoughts and feelings regarding current medical situation.  Today's Discussion: Today before seeing the patient at the bedside I spoke with the patient's nurse.  She states he has been getting steps in the right direction.  He is following some commands today, remains on CRRT.  He did fail vent weaning but the plan is to try to pull more fluid to see if this will help.  They will continue vent weaning at times.  The patient has had some agitation requiring pain medicine to help with his saturations and work of breathing.  After speaking with the nurse I saw the patient at the bedside, his granddaughter was present.  He seemed slightly agitated and was lifting his arm up which is concern for possible self extubation.  Nursing came bedside to provide assistance.  Also bedside was respiratory therapy who provided deep suctioning via the ET tube, which did help with saturations.  I explained the role of palliative care to the patient's  granddaughter and shared that I am here to check on the patient while we are allowing time for CRRT trial.  We discussed CRRT trial, attempted daily vent weaning.  We discussed the small steps that he has made including following commands, more awake.  The patient's granddaughter shares that his wife has been the one most engage in conversations but she will let her know we stop by.  I shared that palliative medicine will intermittently check in and available for further goals of care discussions as the clinical picture clears.  I provided emotional and general support through therapeutic listening, empathy, sharing of stories, and other techniques. I answered all questions and addressed all concerns to the best of my ability.  Review of Systems  Unable to perform ROS: Intubated    Objective:   Vital Signs:  BP (!) 185/84 Comment: prn given, levo decreased  Pulse 93   Temp (!) 96.3 F (35.7 C) (Axillary) Comment: Reported to the nurse, warm blankets applied.  Resp (!) 36   Ht 5\' 10"  (1.778 m)   Wt (!) 137.7 kg   SpO2 90%   BMI 43.56 kg/m   Physical Exam Vitals and nursing note reviewed.  Constitutional:      General: He is not in acute distress.    Appearance: He is ill-appearing.  HENT:     Head: Normocephalic and atraumatic.  Cardiovascular:     Rate and Rhythm: Normal rate.  Pulmonary:     Effort: Pulmonary effort is normal. Tachypnea (RR 26-28) present. No respiratory distress.     Breath sounds: No wheezing or rhonchi.  Abdominal:     General: Abdomen is flat. Bowel sounds are normal. There is no distension.     Palpations: Abdomen is soft.  Skin:    General: Skin is cool and dry.     Comments: RLE coolder than LLE, nurse aware/has been baseline  Neurological:     Mental Status: He is alert.     Comments: Follows simple commands     Palliative Assessment/Data: 10%    Existing Vynca/ACP Documentation: None  Assessment & Plan:   Impression: Present on  Admission:  Acute ischemic stroke (HCC)  Acute systolic heart failure (HCC)  Primary hypertension  SUMMARY OF RECOMMENDATIONS   Remain full code Full scope of care Ongoing CRRT trial Ongoing daily vent needs Noted small improvements Palliative medicine will follow peripherally Please notify us of significant clinical change  Symptom Management:  Per primary team PMT is available to assist as needed  Code Status: Full code  Prognosis: Unable to determine  Discharge Planning: To Be Determined  Discussed with: Patient, patient's family, medical team, nursing team, RT  Thank you for allowing [place] to participate in the care of [name]. PMT will continue to support holistically.  Time Total: 28 min  Detailed review of medical records (labs, imaging, vital signs), medically appropriate exam, discussed with treatment team, counseling and education to patient, family, & staff, documenting clinical information, medication management, coordination of care  Wynne Dust, NP Palliative Medicine Team  Team Phone # [phone] (Nights/Weekends)  12/27/2020, 8:17 AM

## 2023-06-24 NOTE — Inpatient Diabetes Management (Signed)
 Inpatient Diabetes Program Recommendations  AACE/ADA: New Consensus Statement on Inpatient Glycemic Control (2015)  Target Ranges:  Prepandial:   less than 140 mg/dL      Peak postprandial:   less than 180 mg/dL (1-2 hours)      Critically ill patients:  140 - 180 mg/dL   Lab Results  Component Value Date   GLUCAP 260 (H) 06/24/2023   HGBA1C 6.1 (H) 06/05/2023    Review of Glycemic Control  Latest Reference Range & Units 06/23/23 07:51 06/23/23 11:37 06/23/23 15:32 06/23/23 19:23 06/23/23 23:13 06/24/23 03:11 06/24/23 07:48  Glucose-Capillary 70 - 99 mg/dL 960 (H) 454 (H) 098 (H) 241 (H) 217 (H) 241 (H) 260 (H)   Diabetes history: DM 2 Outpatient Diabetes medications: 70/30 and jardiance in the past listed as not taking Current orders for Inpatient glycemic control:  Semglee 20 units bid started today Novolog 0-20 units Q4hours  Osmolite 50 ml/hour  Inpatient Diabetes Program Recommendations:    -   Start Novolog 3 units Q4 hours Tube Feed Coverage (so pt does not have hypoglycemia if tube feeds are stopped)  Thanks,  Christena Deem RN, MSN, BC-ADM Inpatient Diabetes Coordinator Team Pager [phone] (8a-5p)

## 2023-06-24 NOTE — Progress Notes (Addendum)
 RN contacted E-link. E-link made aware that patient has been agitated, with respirations in the 30s and 40s, along with guppy breathing, since this RN arrived to unit for shift. RN has titrated up sedation and used PRN fentanyl to treat pain. Patient also has what sounds like a cuff leak. Respiratory therapy has assessed multiple times. Patient repositioned. Spoke with E-link physician who placed an order for a STAT chest x ray.

## 2023-06-24 NOTE — Progress Notes (Signed)
 Advanced patient ETT to 27@lip  per chest x-ray and doctor orders.

## 2023-06-24 NOTE — Progress Notes (Addendum)
 STROKE TEAM PROGRESS NOTE   SUBJECTIVE (INTERVAL HISTORY)  Patient's granddaughter was in the room Discussed his condition with his wife on speaker phone.  He is now on CCRT.  She has questions about prognosis.  Palliative care following.  He is off sedation.  Started on low-dose Seroquel by CCM.  Will continue diuresis and hope to extubate in the next day or so. No significant events overnight   OBJECTIVE Temp:  [96.3 F (35.7 C)-98.5 F (36.9 C)] 97.9 F (36.6 C) (02/24 1200) Pulse Rate:  [57-93] 77 (02/24 1300) Cardiac Rhythm: Normal sinus rhythm (02/24 0826) Resp:  [22-36] 25 (02/24 1300) BP: (86-185)/(18-89) 125/55 (02/24 1300) SpO2:  [90 %-99 %] 96 % (02/24 1300) FiO2 (%):  [30 %-50 %] 50 % (02/24 1038) Weight:  [137.7 kg] 137.7 kg (02/24 0500)  Recent Labs  Lab 06/23/23 1923 06/23/23 2313 06/24/23 0311 06/24/23 0748 06/24/23 1129  GLUCAP 241* 217* 241* 260* 295*   Recent Labs  Lab 06/20/23 0447 06/20/23 0447 06/20/23 0823 06/21/23 0602 06/22/23 0608 06/22/23 1608 06/23/23 0509 06/23/23 1623 06/24/23 0520  NA 149*  --   --  148* 144 143 137 137 136  K 3.8  --   --  3.9 3.8 3.9 3.9 4.1 4.2  CL 114*  --   --  114* 111 111 104 104 102  CO2 21*  --   --  20* 20* 22 21* 23 24  GLUCOSE 186*  --   --  227* 315* 289* 250* 350* 252*  BUN 102*  --   --  131* 154* 133* 91* 79* 54*  CREATININE 4.19*  --   --  5.66* 6.92* 5.83* 4.23* 3.68* 2.80*  CALCIUM 8.9  --   --  9.0 8.8* 8.4* 8.3* 8.1* 8.7*  MG 2.4  --  2.3 2.4  --   --  2.4  --  2.4  PHOS  --    < > 4.6 5.4*  --  4.3 3.1 2.8 3.0   < > = values in this interval not displayed.   Recent Labs  Lab 06/22/23 1608 06/23/23 0509 06/23/23 1623 06/24/23 0520  ALBUMIN 1.8* 1.9* 1.9* 2.0*    Recent Labs  Lab 06/18/23 0758 06/19/23 0031 06/19/23 1544 06/20/23 0447 06/21/23 0602  WBC 19.5* 7.4 4.9 5.7 7.3  HGB 7.6* 7.7* 8.3* 8.6* 7.6*  HCT 24.6* 25.2* 27.1* 28.0* 25.3*  MCV 99.2 100.8* 100.0 100.7*  103.3*  PLT 157 118* 115* 113* 110*       Component Value Date/Time   CHOL 183 06/06/2023 0557   TRIG 109 06/19/2023 0031   HDL 60 06/06/2023 0557   CHOLHDL 3.1 06/06/2023 0557   VLDL 16 06/06/2023 0557   LDLCALC 107 (H) 06/06/2023 0557   LDLCALC 223 (H) 05/31/2021 0000   Lab Results  Component Value Date   HGBA1C 6.1 (H) 06/05/2023      Component Value Date/Time   LABOPIA NONE DETECTED 06/05/2023 1653   COCAINSCRNUR NONE DETECTED 06/05/2023 1653   LABBENZ NONE DETECTED 06/05/2023 1653   AMPHETMU NONE DETECTED 06/05/2023 1653   THCU NONE DETECTED 06/05/2023 1653   LABBARB NONE DETECTED 06/05/2023 1653    No results for input(s): "ETH" in the last 168 hours.   DG CHEST PORT 1 VIEW Result Date: 06/23/2023 CLINICAL DATA:  Central line placement EXAM: PORTABLE CHEST 1 VIEW COMPARISON:  06/22/2023 FINDINGS: RIGHT central venous line with tip in distal SVC. LEFT central venous line with tip in the  mid SVC. Endotracheal tube and feeding tube unchanged. No pneumothorax. Bilateral small effusions. Low lung volumes. IMPRESSION: 1. Bilateral central venous lines without complication. 2. Low lung volumes and small effusions. Electronically Signed   By: Genevive Bi M.D.   On: 06/23/2023 10:10   DG Chest Port 1 View Result Date: 06/22/2023 CLINICAL DATA:  Central line placement. EXAM: PORTABLE CHEST 1 VIEW COMPARISON:  June 19, 2023. FINDINGS: Stable cardiomediastinal silhouette. Hypoinflation of the lungs is noted with bibasilar subsegmental atelectasis and probable small pleural effusions. Endotracheal tube and feeding tube are unchanged. Left internal jugular catheter is unchanged. Interval placement of right internal jugular catheter with distal tip in expected position of the SVC. No pneumothorax. IMPRESSION: Interval placement of right internal jugular catheter with distal tip in expected position of the SVC. Electronically Signed   By: Lupita Raider M.D.   On: 06/22/2023 11:36    DG CHEST PORT 1 VIEW Result Date: 06/19/2023 CLINICAL DATA:  Respiratory distress. EXAM: PORTABLE CHEST 1 VIEW COMPARISON:  Radiograph 06/15/2023, CT 06/16/2023 FINDINGS: Tip of the endotracheal tube 4.5 cm from the carina. Enteric tube and left central line unchanged in positioning. Slight improved right lung aeration with persistent basilar opacity. Slight worsening patchy opacity at the left lung base. No pneumothorax or large pleural effusion. No pulmonary edema. Stable heart size and mediastinal contours. IMPRESSION: 1. Bibasilar opacities, improving on the right but worsening on the left. 2. Stable support apparatus. Electronically Signed   By: Narda Rutherford M.D.   On: 06/19/2023 16:02   DG Abd Portable 1V Result Date: 06/18/2023 CLINICAL DATA:  Orogastric tube placement. EXAM: PORTABLE ABDOMEN - 1 VIEW COMPARISON:  Radiographs 06/05/2023.  Abdominal CT 06/16/2023. FINDINGS: 1236 hours. Tip of the enteric tube projects over the right upper quadrant of the abdomen, likely in the distal stomach. There is a small amount contrast material within the stomach. The visualized bowel gas pattern is normal. Patient is rotated to the right with persistent right basilar airspace disease and a small right pleural effusion. IMPRESSION: Enteric tube tip projects over the distal stomach. Electronically Signed   By: Carey Bullocks M.D.   On: 06/18/2023 14:48   CT CHEST ABDOMEN PELVIS WO CONTRAST Result Date: 06/16/2023 CLINICAL DATA:  Septicemia.  Pneumonia. EXAM: CT CHEST, ABDOMEN AND PELVIS WITHOUT CONTRAST TECHNIQUE: Multidetector CT imaging of the chest, abdomen and pelvis was performed following the standard protocol without IV contrast. RADIATION DOSE REDUCTION: This exam was performed according to the departmental dose-optimization program which includes automated exposure control, adjustment of the mA and/or kV according to patient size and/or use of iterative reconstruction technique. COMPARISON:  CT  angio chest from 04/04/2016. FINDINGS: CT CHEST FINDINGS Cardiovascular: Heart size is normal. Aortic atherosclerosis and multi vessel coronary artery calcifications. No pericardial effusion. Mediastinum/Nodes: Thyroid gland, trachea and esophagus appear normal. ET tube is in place with tip above the carina. There is a enteric tube with tip in the stomach.No enlarged mediastinal lymph nodes. Hilar lymph nodes are suboptimally evaluated due to lack of IV contrast. Lungs/Pleura: No pleural effusion or interstitial edema. Bilateral lower lobe airspace consolidation is identified, right greater than left. Imaging findings compatible with multifocal pneumonia. Chronic subsegmental atelectasis this is within the right middle lobe and right lower lobe with asymmetric volume loss and elevation of the right hemidiaphragm. Musculoskeletal: No chest wall mass or suspicious bone lesions identified. CT ABDOMEN PELVIS FINDINGS Hepatobiliary: No focal liver abnormality. Gallbladder appears normal. No signs of bile duct dilatation. Pancreas:  Unremarkable. No pancreatic ductal dilatation or surrounding inflammatory changes. Spleen: Normal in size without focal abnormality. Adrenals/Urinary Tract: Normal adrenal glands. Multiple stones identified within the upper pole of the left kidney which measure up to 7 mm. Bilateral exophytic kidney lesions of varying complexity are identified. These are suboptimally assessed on the current exam reflecting lack of IV contrast material. No signs of hydronephrosis. Urinary bladder is decompressed around a Foley catheter. Stomach/Bowel: Enteric tube tip is in the proximal stomach. There is retained enteric contrast material within the gastric fundus. The appendix is visualized and appears normal. There are several dilated loops of small bowel within the right hemiabdomen. The proximal small bowel loops are normal in caliber. Within the right upper quadrant of the abdomen there are multiple dilated  loops of small bowel measuring up to 3.5 cm containing air-fluid levels. The bowel loops proximal and distal are normal in caliber. Within the limitations of unenhanced technique there is no significant bowel wall thickening. Distal colonic diverticulosis identified without signs of acute diverticulitis. Retained enteric contrast material identified within the colon up to the rectum Vascular/Lymphatic: Aortic atherosclerosis without aneurysm. No signs of abdominopelvic adenopathy. Reproductive: Prostate gland is either atrophic or surgically absent. Penile prosthesis identified. Other: No free fluid or fluid collections. Musculoskeletal: No acute or significant osseous findings. IMPRESSION: 1. Bilateral lower lobe airspace consolidation, right greater than left. Imaging findings compatible with multifocal pneumonia. 2. Chronic subsegmental atelectasis within the right middle lobe and right lower lobe with asymmetric volume loss and elevation of the right hemidiaphragm. 3. Assessment of bowel pathology is limited due to lack of IV and enteric contrast material. There are multiple abnormally dilated loops of small bowel within the right hemiabdomen. Findings are compatible with a small-bowel obstruction. As there is normal caliber proximal small bowel and normal caliber distal small bowel findings are concerning for either close loop obstruction or internal hernia. 4. Distal colonic diverticulosis without signs of acute diverticulitis. 5. Nonobstructing left renal calculi. 6. Bilateral exophytic kidney lesions of varying complexity are identified. These are suboptimally assessed on the current exam reflecting lack of IV contrast material. Further evaluation with nonemergent renal ultrasound is advised. 7.  Aortic Atherosclerosis (ICD10-I70.0). Electronically Signed   By: Signa Kell M.D.   On: 06/16/2023 15:51   ECHOCARDIOGRAM LIMITED Result Date: 06/15/2023    ECHOCARDIOGRAM LIMITED REPORT   Patient Name:    Dorian Duval. Date of Exam: 06/15/2023 Medical Rec #:  [id number]              Height:       70.0 in Accession #:    [id number]             Weight:       263.9 lb Date of Birth:  10-Apr-1941              BSA:          2.348 m Patient Age:    83 years               BP:           121/53 mmHg Patient Gender: M                      HR:           90 bpm. Exam Location:  Inpatient Procedure: Limited Echo, Color Doppler and Intracardiac Opacification Agent            (Both Spectral  and Color Flow Doppler were utilized during            procedure). Indications:    Stroke i63.9, Shock  History:        Patient has prior history of Echocardiogram examinations, most                 recent 06/05/2023. CHF; Risk Factors:Hypertension and                 Dyslipidemia.  Sonographer:    Irving Burton Senior RDCS Referring Phys: [phone] CHI JANE ELLISON  Sonographer Comments: Technically difficult due to body habitus, scanned supine on artificial repirator IMPRESSIONS  1. Very difficult to determine LVEF to limited visualization and frequent ectopy. . Left ventricular ejection fraction, by estimation, is 40 to 45%. The left ventricle has mildly decreased function. Left ventricular endocardial border not optimally defined to evaluate regional wall motion.  2. Right ventricular systolic function was not well visualized. The right ventricular size is not well visualized.  3. The inferior vena cava is normal in size with <50% respiratory variability, suggesting right atrial pressure of 8 mmHg.  4. Limited echo FINDINGS  Left Ventricle: Very difficult to determine LVEF to limited visualization and frequent ectopy. Left ventricular ejection fraction, by estimation, is 40 to 45%. The left ventricle has mildly decreased function. Left ventricular endocardial border not optimally defined to evaluate regional wall motion. Definity contrast agent was given IV to delineate the left ventricular endocardial borders. Right Ventricle: The right  ventricular size is not well visualized. Right vetricular wall thickness was not well visualized. Right ventricular systolic function was not well visualized. Venous: The inferior vena cava is normal in size with less than 50% respiratory variability, suggesting right atrial pressure of 8 mmHg. Additional Comments: Color Doppler performed.  Dina Rich MD Electronically signed by Dina Rich MD Signature Date/Time: 06/15/2023/4:31:38 PM    Final    DG Abd 1 View Result Date: 06/15/2023 CLINICAL DATA:  Orogastric tube placement. EXAM: ABDOMEN - 1 VIEW COMPARISON:  Radiograph 06/08/2023 FINDINGS: Tip of the enteric tube below the diaphragm in the stomach, the side port is just beyond the gastroesophageal junction. There is gaseous gastric distension. Dilated bowel in the right abdomen up 4.7 cm, potentially small bowel, but incompletely included in the field of view. IMPRESSION: 1. Tip of the enteric tube below the diaphragm in the stomach, side-port just beyond the gastroesophageal junction. 2. Gaseous gastric distension. Additional dilated bowel in the right abdomen likely small bowel, although incompletely included in the field of view. Electronically Signed   By: Narda Rutherford M.D.   On: 06/15/2023 15:06   DG CHEST PORT 1 VIEW Result Date: 06/15/2023 CLINICAL DATA:  10031 Cough 10031 EXAM: PORTABLE CHEST 1 VIEW COMPARISON:  June 08, 2023 FINDINGS: The cardiomediastinal silhouette is unchanged in contour.Unchanged elevation of the RIGHT hemidiaphragm. No pleural effusion. No pneumothorax. Bibasilar platelike opacities. Atherosclerotic calcifications. Similar background of interstitial prominence. IMPRESSION: Bibasilar platelike opacities, favored to reflect atelectasis. Electronically Signed   By: Meda Klinefelter M.D.   On: 06/15/2023 12:07   DG CHEST PORT 1 VIEW Result Date: 06/15/2023 CLINICAL DATA:  Respiratory failure, intubation and central line placement. EXAM: PORTABLE CHEST 1 VIEW  COMPARISON:  Film earlier today at 0734 hours FINDINGS: Interval intubation with the endotracheal tube tip approximately 3.5 cm above the carina. Left jugular central line placement with the line tip in the upper SVC. No pneumothorax. Volume loss of the right lung remains with elevation  of the right hemidiaphragm. Improved expansion of the left lung. Potential underlying mild pulmonary interstitial edema. No significant pleural effusions. IMPRESSION: 1. Interval intubation and left jugular central line placement. No pneumothorax. 2. Improved expansion of the left lung. Persistent volume loss of the right lung with elevation of the right hemidiaphragm. Potential underlying mild pulmonary interstitial edema. Electronically Signed   By: Irish Lack M.D.   On: 06/15/2023 10:42   DG Swallowing Func-Speech Pathology Result Date: 06/12/2023 Table formatting from the original result was not included. Modified Barium Swallow Study Patient Details Name: Zacharias Ridling. MRN: [id number] Date of Birth: 1940/09/08 Today's Date: 06/12/2023 HPI/PMH: HPI: Pt is a 83 y.o. male who was admitted as a code stroke on 02/05 due to acute onset of right-sided weakness, right facial droop and aphasia. MRI from 02/06 displayed an evolving acute left MCA infarct, most pronounced at the left insula and left frontal operculum. Pt has intentionally lost 300 pounds in the recent past, with pre-loss weight at ~600Ib. PMHx includes hypertension, diabetes, CKD, prior CVA. Clinical Impression: Clinical Impression: Pt presents with a mild-moderate oropharyngeal dysphagia (DIGEST Score: 1) primarily characterized by silent penetration of thin liquid at level of VFs, flash penetration of nectar-thick above VFs, and diffuse pharyngeal residue. Pt's dysphagia severity level dropped from DIGEST score of 3 to 1, compared to previous MBS eval on 02/07.     Pharyngeal residue was present throughout all consistencies. Pt's posture for swallowing was  consistently neutral. Pt follows commands for compensatory strategies more readily now with max multimodal cueing. Effective compensatory strategies include multiple swallows, throat clearing, and a straw when used with a verbally cued slow rate. Multiple consecutive sips of thin-liquid presented the most risk for aspiration due to silent penetration of Vfs. Unlike previous MBS study, pt tolerated solid consistency, displaying prolonged mastication but majority clearance of bolus after 2-3 swallows.     Recommendations include upgrading pt's diet to dysphagia 2 (finely chopped) and thin liquids to optimize safety and efficiency for swallowing. SLP f/u to monitor tolerance of thin-liquids with compensatory strategies is highly recommended. Pt needs to remain upright for 30 minutes after PO intake due to remaining pharyngeal residue to optimize airway safety. Factors that may increase risk of adverse event in presence of aspiration [name] & Clearance [name] 2021): Factors that may increase risk of adverse event in presence of aspiration [name] & Clearance [name] 2021): Dependence for feeding and/or oral hygiene Recommendations/Plan: Swallowing Evaluation Recommendations Swallowing Evaluation Recommendations Recommendations: PO diet PO Diet Recommendation: Dysphagia 2 (Finely chopped); Thin liquids (Level 0) Liquid Administration via: Straw Medication Administration: Crushed with puree Supervision: Full supervision/cueing for swallowing strategies; Full assist for feeding Swallowing strategies  : Slow rate; Small bites/sips; Multiple dry swallows after each bite/sip; Clear throat intermittently Postural changes: Position pt fully upright for meals; Stay upright 30-60 min after meals Oral care recommendations: Oral care BID (2x/day); Staff/trained caregiver to provide oral care Treatment Plan Treatment Plan Follow-up recommendations: Follow physicians's recommendations for discharge plan and follow up therapies Functional status  assessment: Patient has had a recent decline in their functional status and demonstrates the ability to make significant improvements in function in a reasonable and predictable amount of time. Recommendations Recommendations for follow up therapy are one component of a multi-disciplinary discharge planning process, led by the attending physician.  Recommendations may be updated based on patient status, additional functional criteria and insurance authorization. Assessment: Orofacial Exam: Orofacial Exam Oral Cavity: Oral Hygiene: WFL Oral Cavity - Dentition: Adequate  natural dentition Orofacial Anatomy: WFL Anatomy: Anatomy: WFL Boluses Administered: Boluses Administered Boluses Administered: Thin liquids (Level 0); Mildly thick liquids (Level 2, nectar thick); Moderately thick liquids (Level 3, honey thick); Puree; Solid  Oral Impairment Domain: Oral Impairment Domain Lip Closure: Interlabial escape, no progression to anterior lip Tongue control during bolus hold: Cohesive bolus between tongue to palatal seal Bolus preparation/mastication: Slow prolonged chewing/mashing with complete recollection Bolus transport/lingual motion: Brisk tongue motion Oral residue: Residue collection on oral structures Location of oral residue : Tongue Initiation of pharyngeal swallow : Pyriform sinuses  Pharyngeal Impairment Domain: Pharyngeal Impairment Domain Soft palate elevation: No bolus between soft palate (SP)/pharyngeal wall (PW) Laryngeal elevation: Partial superior movement of thyroid cartilage/partial approximation of arytenoids to epiglottic petiole Anterior hyoid excursion: Complete anterior movement Epiglottic movement: Complete inversion Laryngeal vestibule closure: Incomplete, narrow column air/contrast in laryngeal vestibule Pharyngeal stripping wave : Present - complete Pharyngeal contraction (A/P view only): N/A Pharyngoesophageal segment opening: Complete distension and complete duration, no obstruction of flow  Tongue base retraction: Trace column of contrast or air between tongue base and PPW Pharyngeal residue: Collection of residue within or on pharyngeal structures Location of pharyngeal residue: Diffuse (>3 areas)  Esophageal Impairment Domain: Esophageal Impairment Domain Esophageal clearance upright position: -- (Not tested) Pill: No data recorded Penetration/Aspiration Scale Score: Penetration/Aspiration Scale Score 1.  Material does not enter airway: Solid; Puree; Moderately thick liquids (Level 3, honey thick) 2.  Material enters airway, remains ABOVE vocal cords then ejected out: Mildly thick liquids (Level 2, nectar thick) 5.  Material enters airway, CONTACTS cords and not ejected out: Thin liquids (Level 0) Compensatory Strategies: Compensatory Strategies Compensatory strategies: Yes Straw: Effective Effective Straw: Mildly thick liquid (Level 2, nectar thick) (Effective with slow rate applied.) Multiple swallows: Effective Effective Multiple Swallows: Thin liquid (Level 0); Mildly thick liquid (Level 2, nectar thick); Moderately thick liquid (Level 3, honey thick); Puree; Solid   General Information: Caregiver present: No  Diet Prior to this Study: Dysphagia 1 (pureed); Moderately thick liquids (Level 3, honey thick)   Temperature : Normal   Respiratory Status: WFL   Supplemental O2: None (Room air)   History of Recent Intubation: Yes  Behavior/Cognition: Alert; Cooperative; Pleasant mood; Requires cueing Self-Feeding Abilities: Needs assist with self-feeding Baseline vocal quality/speech: Normal Volitional Cough: Unable to elicit Volitional Swallow: Able to elicit Exam Limitations: No limitations Goal Planning: Prognosis for improved oropharyngeal function: Good Barriers to Reach Goals: Language deficits No data recorded No data recorded No data recorded Pain: Pain Assessment Pain Assessment: No/denies pain Pain Score: 0 End of Session: Start Time:SLP Start Time (ACUTE ONLY): 1310 Stop Time: SLP Stop Time  (ACUTE ONLY): 1335 Time Calculation:SLP Time Calculation (min) (ACUTE ONLY): 25 min Charges: SLP Evaluations $ SLP Speech Visit: 1 Visit SLP Evaluations $MBS Swallow: 1 Procedure $Swallowing Treatment: 1 Procedure SLP visit diagnosis: SLP Visit Diagnosis: Dysphagia, oropharyngeal phase (R13.12) Past Medical History: Past Medical History: Diagnosis Date  Anemia of chronic disease   Ankle fracture 02/15/2016  Cancer (HCC)   Prostate  Chronic constipation   Chronic kidney disease   stage III  Diabetes mellitus without complication (HCC)   type II   Diabetic peripheral neuropathy (HCC)   Failure to thrive (0-17)   Fracture of left lower leg   Gout   Hyperlipidemia   Hypertension   Morbid obesity (HCC)   Unstable gait  Past Surgical History: Past Surgical History: Procedure Laterality Date  IR CT HEAD LTD  06/05/2023  IR INTRAVSC  STENT CERV CAROTID W/O EMB-PROT MOD SED INC ANGIO  06/05/2023  IR PERCUTANEOUS ART THROMBECTOMY/INFUSION INTRACRANIAL INC DIAG ANGIO  06/05/2023  IR US GUIDE VASC ACCESS RIGHT  06/05/2023  ORIF ANKLE FRACTURE Left 02/29/2016  Procedure: OPEN REDUCTION INTERNAL FIXATION (ORIF) ANKLE FRACTURE;  Surgeon: Yolonda Kida, MD;  Location: [facility];  Service: Orthopedics;  Laterality: Left;  PROSTATE SURGERY    RADIOLOGY WITH ANESTHESIA N/A 06/05/2023  Procedure: IR WITH ANESTHESIA;  Surgeon: Radiologist, Medication, MD;  Location: [facility];  Service: Radiology;  Laterality: N/A; Claudine Mouton 06/12/2023, 3:16 PM  DG Abd 1 View Result Date: 06/09/2023 CLINICAL DATA:  [id number]. Encounter for feeding tube placement. EXAM: ABDOMEN - 1 VIEW COMPARISON:  Abdomen film 06/05/2023. FINDINGS: Dobbhoff feeding tube is well placed with the radiopaque tip in the distal stomach. The visualized bowel pattern is nonobstructive. There is barium newly seen in the flexures and transverse colon. There is no supine evidence of free air. There is atelectasis in the lung bases. Cardiomegaly. IMPRESSION: 1. Dobbhoff feeding  tube well placed with the radiopaque tip in the distal stomach. 2. Nonobstructive bowel gas pattern. 3. Cardiomegaly. Electronically Signed   By: Almira Bar M.D.   On: 06/09/2023 03:41   DG CHEST PORT 1 VIEW Result Date: 06/08/2023 CLINICAL DATA:  CHF EXAM: PORTABLE CHEST 1 VIEW COMPARISON:  01/06/2023 FINDINGS: Feeding tube in place. There is cardiomegaly with vascular congestion. Chronic elevation of the right hemidiaphragm with right base atelectasis. Interstitial prominence throughout the lungs, right greater than left could reflect interstitial edema. Possible small effusions. No acute bony abnormality. IMPRESSION: Cardiomegaly with vascular congestion and probable mild interstitial edema. Question small bilateral effusions. Chronic elevation of the right hemidiaphragm with right base atelectasis. Electronically Signed   By: Charlett Nose M.D.   On: 06/08/2023 18:20   DG Swallowing Func-Speech Pathology Result Date: 06/07/2023 Table formatting from the original result was not included. Modified Barium Swallow Study Study completed and documented by [name], SLP Student Supervised and reviewed by Harlon Ditty, MA CCC-SLP [facility] Secure Chat Preferred Office [phone] Patient Details Name: Vineet Kinney. MRN: [id number] Date of Birth: 1941/03/13 Today's Date: 06/07/2023 HPI/PMH: HPI: Pt is a 83 y.o. male who was admitted as a code stroke on 02/05 due to acute onset of right-sided weakness, right facial droop and aphasia. MRI from 02/06 displayed an evolving acute left MCA infarct, most pronounced at the left insula and left frontal operculum. Pt has intentionally lost 300 pounds in the recent past, with pre-loss weight at ~600Ib. PMHx includes hypertension, diabetes, CKD, prior CVA. Clinical Impression: Clinical Impression: Pt presented with a moderate-severe oropharyngeal dysphagia (DIGEST Score: 3) primarily characterized by silent aspiration of thin liquid, silent  penetration of nectar-thick liquid, diffuse pharyngeal residue collection during thin/nectar-thick consistencies, and posterior-escape of bolus across multiple consistencies. Pt positioning was a potential limitation for examination due to consistent posterior head position. More anterior head positioning during nectar-thick liquid displayed less pharyngeal residue, increased hyo-laryngeal excursion, and overall better airway safety. This positioning was not present throughout due to pt's receptive language deficits to understand cues. Alternating between honey-thick and puree consistencies provided more oropharyngeal bolus clearance and no signs of aspiration. Recommend a dysphagia 1 (puree) and honey-thick liquid diet to optimize pt's airway safety and potential for meeting nutritional needs. Slow rate, small bites/sips, natural head posturing, and multiple swallows are compensatory strategies that displayed best efficiency with pt. F/u with SLP for family education, compensatory strategy training,  and upgraded diet trials is recommended. Factors that may increase risk of adverse event in presence of aspiration [name] & Clearance [name] 2021): Factors that may increase risk of adverse event in presence of aspiration [name] & Clearance [name] 2021): Weak cough Recommendations/Plan: Swallowing Evaluation Recommendations Swallowing Evaluation Recommendations Recommendations: PO diet PO Diet Recommendation: Dysphagia 1 (Pureed); Moderately thick liquids (Level 3, honey thick) Liquid Administration via: Spoon Medication Administration: Crushed with puree Supervision: Full supervision/cueing for swallowing strategies; Full assist for feeding Swallowing strategies  : Slow rate; Small bites/sips; Multiple dry swallows after each bite/sip (Pt needs to be in a neutral head position (no posterior head tilt).) Postural changes: Position pt fully upright for meals; Stay upright 30-60 min after meals Oral care recommendations: Oral care BID  (2x/day); Staff/trained caregiver to provide oral care Caregiver Recommendations: Have oral suction available Treatment Plan Treatment Plan Treatment recommendations: Therapy as outlined in treatment plan below Follow-up recommendations: Follow physicians's recommendations for discharge plan and follow up therapies Functional status assessment: Patient has had a recent decline in their functional status and demonstrates the ability to make significant improvements in function in a reasonable and predictable amount of time. Treatment frequency: Min 2x/week Treatment duration: 2 weeks Interventions: Aspiration precaution training; Compensatory techniques; Patient/family education; Diet toleration management by SLP; Trials of upgraded texture/liquids Recommendations Recommendations for follow up therapy are one component of a multi-disciplinary discharge planning process, led by the attending physician.  Recommendations may be updated based on patient status, additional functional criteria and insurance authorization. Assessment: Orofacial Exam: Orofacial Exam Oral Cavity - Dentition: Adequate natural dentition Orofacial Anatomy: WFL Anatomy: Anatomy: WFL Boluses Administered: Boluses Administered Boluses Administered: Thin liquids (Level 0); Mildly thick liquids (Level 2, nectar thick); Moderately thick liquids (Level 3, honey thick); Puree; Solid  Oral Impairment Domain: Oral Impairment Domain Lip Closure: Escape progressing to mid-chin Tongue control during bolus hold: Posterior escape of greater than half of bolus Bolus preparation/mastication: -- (Pt did not chew solid and SLP had to remove it from mouth.) Bolus transport/lingual motion: Repetitive/disorganized tongue motion Oral residue: Residue collection on oral structures Location of oral residue : Tongue; Palate Initiation of pharyngeal swallow : Pyriform sinuses  Pharyngeal Impairment Domain: Pharyngeal Impairment Domain Soft palate elevation: No bolus  between soft palate (SP)/pharyngeal wall (PW) Laryngeal elevation: Partial superior movement of thyroid cartilage/partial approximation of arytenoids to epiglottic petiole Anterior hyoid excursion: Partial anterior movement Epiglottic movement: Partial inversion Laryngeal vestibule closure: Incomplete, narrow column air/contrast in laryngeal vestibule Pharyngeal stripping wave : Present - complete Pharyngeal contraction (A/P view only): N/A Pharyngoesophageal segment opening: Complete distension and complete duration, no obstruction of flow Tongue base retraction: Narrow column of contrast or air between tongue base and PPW Pharyngeal residue: Collection of residue within or on pharyngeal structures Location of pharyngeal residue: Diffuse (>3 areas)  Esophageal Impairment Domain: Esophageal Impairment Domain Esophageal clearance upright position: -- (Not tested.) Pill: No data recorded Penetration/Aspiration Scale Score: Penetration/Aspiration Scale Score 1.  Material does not enter airway: Puree; Moderately thick liquids (Level 3, honey thick) 3.  Material enters airway, remains ABOVE vocal cords and not ejected out: Mildly thick liquids (Level 2, nectar thick) 8.  Material enters airway, passes BELOW cords without attempt by patient to eject out (silent aspiration) : Thin liquids (Level 0) Compensatory Strategies: Compensatory Strategies Compensatory strategies: Yes Straw: Ineffective Multiple swallows: Effective (Required max cueing due to pt's language.) Effective Multiple Swallows: Puree; Moderately thick liquid (Level 3, honey thick); Mildly thick liquid (Level 2, nectar thick)  General Information: Caregiver present: No  Diet Prior to this Study: NPO   Temperature : Normal   Respiratory Status: WFL   Supplemental O2: None (Room air)   No data recorded Behavior/Cognition: Alert; Cooperative; Pleasant mood; Requires cueing Self-Feeding Abilities: Needs assist with self-feeding Baseline vocal quality/speech:  Dysphonic Volitional Cough: Unable to elicit Volitional Swallow: Able to elicit Exam Limitations: Poor positioning Goal Planning: Prognosis for improved oropharyngeal function: Good Barriers to Reach Goals: Language deficits No data recorded No data recorded Consulted and agree with results and recommendations: Pt unable/family or caregiver not available Pain: Pain Assessment Pain Assessment: No/denies pain Faces Pain Scale: 0 Facial Expression: 0 Body Movements: 0 Muscle Tension: 0 Compliance with ventilator (intubated pts.): N/A Vocalization (extubated pts.): 0 CPOT Total: 0 End of Session: Start Time:SLP Start Time (ACUTE ONLY): 0903 Stop Time: SLP Stop Time (ACUTE ONLY): 0930 Time Calculation:SLP Time Calculation (min) (ACUTE ONLY): 27 min Charges: SLP Evaluations $ SLP Speech Visit: 1 Visit SLP Evaluations $BSS Swallow: 1 Procedure $MBS Swallow: 1 Procedure $ SLP EVAL LANGUAGE/SOUND PRODUCTION: 1 Procedure $Swallowing Treatment: 1 Procedure SLP visit diagnosis: SLP Visit Diagnosis: Dysphagia, oropharyngeal phase (R13.12) Past Medical History: Past Medical History: Diagnosis Date  Anemia of chronic disease   Ankle fracture 02/15/2016  Cancer (HCC)   Prostate  Chronic constipation   Chronic kidney disease   stage III  Diabetes mellitus without complication (HCC)   type II   Diabetic peripheral neuropathy (HCC)   Failure to thrive (0-17)   Fracture of left lower leg   Gout   Hyperlipidemia   Hypertension   Morbid obesity (HCC)   Unstable gait  Past Surgical History: Past Surgical History: Procedure Laterality Date  IR CT HEAD LTD  06/05/2023  IR INTRAVSC STENT CERV CAROTID W/O EMB-PROT MOD SED INC ANGIO  06/05/2023  IR PERCUTANEOUS ART THROMBECTOMY/INFUSION INTRACRANIAL INC DIAG ANGIO  06/05/2023  IR US GUIDE VASC ACCESS RIGHT  06/05/2023  ORIF ANKLE FRACTURE Left 02/29/2016  Procedure: OPEN REDUCTION INTERNAL FIXATION (ORIF) ANKLE FRACTURE;  Surgeon: Yolonda Kida, MD;  Location: [facility];  Service: Orthopedics;   Laterality: Left;  PROSTATE SURGERY    RADIOLOGY WITH ANESTHESIA N/A 06/05/2023  Procedure: IR WITH ANESTHESIA;  Surgeon: Radiologist, Medication, MD;  Location: [facility];  Service: Radiology;  Laterality: N/A; [place], Riley Nearing 06/07/2023, 12:15 PM  CT HEAD WO CONTRAST ( ) Result Date: 06/07/2023 CLINICAL DATA:  83 year old male status post code stroke presentation, left MCA M2 occlusion, with left MCA infarct with confluent petechial hemorrhage. EXAM: CT HEAD WITHOUT CONTRAST TECHNIQUE: Contiguous axial images were obtained from the base of the skull through the vertex without intravenous contrast. RADIATION DOSE REDUCTION: This exam was performed according to the departmental dose-optimization program which includes automated exposure control, adjustment of the mA and/or kV according to patient size and/or use of iterative reconstruction technique. COMPARISON:  Brain MRI yesterday.  Head CT 06/05/2023. FINDINGS: Brain: Confluent mixed density infarct in the left MCA middle division, epicenter at the insula and operculum. Confluent petechial hemorrhage on series 3, image 16 appears stable from the MRI. Superimposed trace subarachnoid blood is difficult to exclude by CT (series 3, image 13), but was not apparent on MRI. Stable mild mass effect on the left lateral ventricle with only trace rightward midline shift (series 3, image 16). No ventriculomegaly. Elsewhere gray-white differentiation is stable from the presentation CT. Basilar cisterns remain patent. Vascular: Resolved hyperdense left MCA since presentation. Calcified atherosclerosis at the skull base. Skull: Stable, intact.  Sinuses/Orbits: Visualized paranasal sinuses and mastoids are stable and well aerated. Other: Left nasoenteric tube now in place. Stable orbit and scalp soft tissues. IMPRESSION: 1. Stable by CT confluent Left MCA middle division infarct with petechial hemorrhage (Heidelberg classification 1b: HI2, confluent petechiae, no mass  effect). Questionable trace superimposed SAH, but was not apparent on MRI. 2. Stable minimal intracranial mass effect. 3. No new intracranial abnormality. Electronically Signed   By: Odessa Fleming M.D.   On: 06/07/2023 05:37   MR BRAIN WO CONTRAST Result Date: 06/06/2023 CLINICAL DATA:  Follow-up examination for stroke. EXAM: MRI HEAD WITHOUT CONTRAST TECHNIQUE: Multiplanar, multiecho pulse sequences of the brain and surrounding structures were obtained without intravenous contrast. COMPARISON:  Comparison made to multiple previous exams from 06/05/2023. FINDINGS: Brain: Examination degraded by motion artifact. Cerebral volume within normal limits for age. Patchy T2/FLAIR hyperintensity involving the periventricular deep white matter both cerebral hemispheres, consistent with chronic small vessel ischemic disease, mild in nature. Small remote infarct noted within the right cerebellum. Confluent restricted diffusion involving the left insula and left frontal operculum, consistent with evolving acute left MCA distribution infarct. Area of infarction measures up to approximately 6 cm in AP diameter. Prominent associated susceptibility artifact, consistent with petechial hemorrhage (series 7, image 61) ( Heidelberg classification 1b: HI2, confluent petechiae, no mass effect. No frank or organized hematoma evident by MRI. No significant regional mass effect. Few additional small foci of infarction noted within the left parieto-occipital region posteriorly (series 3, images 40, 29). Apparent diffusion signal along the right frontal parafalcine region felt to be consistent with artifact related to dural calcification. Note made of a few additional chronic micro hemorrhages within the left cerebellum and right thalamus. No mass lesion or midline shift. Ventricles normal size without hydrocephalus. No extra-axial fluid collection. Pituitary gland suprasellar region within normal limits. Vascular: Major intracranial vascular flow  voids are maintained. Skull and upper cervical spine: Craniocervical junction within normal limits. Bone marrow signal intensity overall within normal limits. No scalp soft tissue abnormality. Sinuses/Orbits: Prior bilateral ocular lens replacement. Paranasal sinuses are clear. No mastoid effusion. Other: None. IMPRESSION: 1. Evolving acute left MCA distribution infarct, most pronounced at the left insula and left frontal operculum. Prominent associated petechial hemorrhage without frank intraparenchymal hematoma (Heidelberg classification 1b: HI2, confluent petechiae, no mass effect). 2. Few additional small foci of acute infarction within the left parieto-occipital region posteriorly. 3. Underlying mild chronic microvascular ischemic disease with small remote right cerebellar infarct. Electronically Signed   By: Rise Mu M.D.   On: 06/06/2023 03:12   ECHOCARDIOGRAM COMPLETE Result Date: 06/05/2023    ECHOCARDIOGRAM REPORT   Patient Name:   Zander Ingham. Date of Exam: 06/05/2023 Medical Rec #:  [id number]              Height:       70.0 in Accession #:    [id number]             Weight:       292.3 lb Date of Birth:  30-Aug-1940              BSA:          2.453 m Patient Age:    82 years               BP:           129/72 mmHg Patient Gender: M  HR:           89 bpm. Exam Location:  Inpatient Procedure: 2D Echo, Color Doppler, Cardiac Doppler and Intracardiac            Opacification Agent Indications:    Stroke I63.9  History:        Patient has prior history of Echocardiogram examinations, most                 recent 10/06/2019. Risk Factors:Diabetes, Hypertension and                 Dyslipidemia.  Sonographer:    Harriette Bouillon RDCS Referring Phys: Lynnae January IMPRESSIONS  1. Left ventricular ejection fraction, by estimation, is 30%. The left ventricle has moderately decreased function. The left ventricle demonstrates global hypokinesis. There is mild left ventricular  hypertrophy.  2. Right ventricular systolic function is normal. The right ventricular size is normal.  3. Trivial mitral valve regurgitation.  4. The aortic valve is tricuspid. Aortic valve regurgitation is not visualized.  5. The inferior vena cava is normal in size with greater than 50% respiratory variability, suggesting right atrial pressure of 3 mmHg. Comparison(s): The left ventricular function is worsened. FINDINGS  Left Ventricle: Left ventricular ejection fraction, by estimation, is 30%. The left ventricle has moderately decreased function. The left ventricle demonstrates global hypokinesis. Definity contrast agent was given IV to delineate the left ventricular endocardial borders. The left ventricular internal cavity size was normal in size. There is mild left ventricular hypertrophy. Right Ventricle: The right ventricular size is normal. Right vetricular wall thickness was not assessed. Right ventricular systolic function is normal. Left Atrium: Left atrial size was normal in size. Right Atrium: Right atrial size was normal in size. Pericardium: There is no evidence of pericardial effusion. Mitral Valve: There is mild thickening of the mitral valve leaflet(s). Mild to moderate mitral annular calcification. Trivial mitral valve regurgitation. Tricuspid Valve: The tricuspid valve is normal in structure. Tricuspid valve regurgitation is trivial. Aortic Valve: The aortic valve is tricuspid. Aortic valve regurgitation is not visualized. Pulmonic Valve: The pulmonic valve was normal in structure. Pulmonic valve regurgitation is not visualized. Aorta: The aortic root and ascending aorta are structurally normal, with no evidence of dilitation. Venous: The inferior vena cava is normal in size with greater than 50% respiratory variability, suggesting right atrial pressure of 3 mmHg. IAS/Shunts: The interatrial septum was not assessed.  LEFT VENTRICLE PLAX 2D LVIDd:         5.00 cm   Diastology LVIDs:         4.40  cm   LV e' lateral: 5.33 cm/s LV PW:         1.20 cm LV IVS:        1.10 cm LVOT diam:     2.30 cm LV SV:         55 LV SV Index:   23 LVOT Area:     4.15 cm  RIGHT VENTRICLE RV S prime:     14.40 cm/s LEFT ATRIUM         Index LA diam:    4.60 cm 1.88 cm/m  AORTIC VALVE LVOT Vmax:   65.50 cm/s LVOT Vmean:  45.200 cm/s LVOT VTI:    0.133 m  AORTA Ao Root diam: 3.00 cm Ao Asc diam:  3.50 cm  SHUNTS Systemic VTI:  0.13 m Systemic Diam: 2.30 cm Dietrich Pates MD Electronically signed by Dietrich Pates MD Signature Date/Time: 06/05/2023/9:33:38 PM  Final    DG Abd Portable 1V Result Date: 06/05/2023 CLINICAL DATA:  Feeding tube placement. EXAM: PORTABLE ABDOMEN - 1 VIEW COMPARISON:  None Available. FINDINGS: Distal tip of feeding tube is seen in expected position of distal stomach. IMPRESSION: Distal tip of feeding tube seen in expected position of distal stomach. Electronically Signed   By: Lupita Raider M.D.   On: 06/05/2023 15:49   CT HEAD WO CONTRAST Result Date: 06/05/2023 CLINICAL DATA:  Stroke, follow-up. Status post intracranial mechanical thrombectomy and stenting of the left ICA bifurcation. EXAM: CT HEAD WITHOUT CONTRAST TECHNIQUE: Contiguous axial images were obtained from the base of the skull through the vertex without intravenous contrast. RADIATION DOSE REDUCTION: This exam was performed according to the departmental dose-optimization program which includes automated exposure control, adjustment of the mA and/or kV according to patient size and/or use of iterative reconstruction technique. COMPARISON:  CT head without contrast and CT angio head and neck 06/05/2023. By plain CT 06/05/2023. FINDINGS: Brain: The study is mildly degraded by patient motion. The left insular and left opercular infarct is somewhat obscured by patient motion. The cortex is slightly hyperdense, potentially reflecting reperfusion. No hemorrhage is present. Basal ganglia are intact. Subcortical white matter hypoattenuation in the  high left frontal lobe is new. Mild right-sided white matter disease is stable. Basal ganglia are intact. A remote lacunar infarct is again noted in the right cerebellum. The brainstem and cerebellum are otherwise within normal limits. Vascular: Atherosclerotic calcifications are present within the cavernous internal carotid arteries and at the normal origin of both vertebral arteries. No hyperdense vessel is present. Skull: Calvarium is intact. No focal lytic or blastic lesions are present. No significant extracranial soft tissue lesion is present. Sinuses/Orbits: The paranasal sinuses and mastoid air cells are clear. Bilateral lens replacements are noted. Globes and orbits are otherwise unremarkable. IMPRESSION: 1. The left insular and left opercular infarct is somewhat obscured by patient motion. 2. The cortex is slightly hyperdense, potentially reflecting reperfusion. 3. No hemorrhage. 4. Subcortical white matter hypoattenuation in the high left frontal lobe is new. This may reflect ischemic changes or edema related to the reperfusion. 5. Remote lacunar infarct of the right cerebellum. 6. Stable mild right-sided white matter disease. This likely reflects the sequela of chronic microvascular ischemia. Electronically Signed   By: Marin Roberts M.D.   On: 06/05/2023 14:38   IR PERCUTANEOUS ART THROMBECTOMY/INFUSION INTRACRANIAL INC DIAG ANGIO Result Date: 06/05/2023 INDICATION: 83 year old male presenting with right-sided weakness and aphasia; NIHSS 28. His last known well was 10 p.m. on 06/04/2023. His past medical history significant for prior stroke, hypertension, diabetes and chronic kidney disease; baseline modified Rankin scale 0. Head CT showed hypodensity within the left insula, basal ganglia and left frontal operculum (ASPECTS 7). No IV thrombolytic given as patient was outside the window. CT angiogram of the head and neck showed an occlusion of a left M2/MCA anterior division branch. CT perfusion  showed a 41 mL core infarct with a 45 mL ischemic penumbra. She was transferred to our service for mechanical thrombectomy. EXAM: ULTRASOUND-GUIDED VASCULAR ACCESS DIAGNOSTIC CEREBRAL ANGIOGRAM MECHANICAL THROMBECTOMY FLAT PANEL HEAD CT LEFT CAROTID STENTING AND ANGIOPLASTY WITHOUT CEREBRAL PROTECTION DEVICE COMPARISON:  CT/CT angiogram of the head and neck June 05, 2023. MEDICATIONS: No antibiotics administered. ANESTHESIA/SEDATION: The procedure was performed under general anesthesia. CONTRAST:  80 mL of Omnipaque 300 milligram/mL FLUOROSCOPY: Radiation Exposure Index (as provided by the fluoroscopic device): 1315 mGy Kerma COMPLICATIONS: None immediate. TECHNIQUE: Informed written consent  was obtained from the patient's wife after a thorough discussion of the procedural risks, benefits and alternatives. All questions were addressed. Maximal Sterile Barrier Technique was utilized including caps, mask, sterile gowns, sterile gloves, sterile drape, hand hygiene and skin antiseptic. A timeout was performed prior to the initiation of the procedure. The right groin was prepped and draped in the usual sterile fashion. Using a micropuncture kit and the modified Seldinger technique, access was gained to the right common femoral artery and an 8 French sheath was placed. Real-time ultrasound guidance was utilized for vascular access including the acquisition of a permanent ultrasound image documenting patency of the accessed vessel. Under fluoroscopy, an 8 [place] Walrus balloon guide catheter was navigated over a 6 [place] VTK catheter and a 0.035" Terumo Glidewire into the aortic arch. The catheter was placed into the left common carotid artery and then advanced into the left internal carotid artery. The diagnostic catheter was removed. Frontal and lateral angiograms of the head were obtained. FINDINGS: 1. Ultrasound showed heavily calcified right common femoral artery is maintained patency and caliber. 2. Proximal  occlusion of a left M2/MCA anterior division branch. 3. Atherosclerotic changes of the intracranial left ICA with mild stenosis at the distal cavernous segment. 4. 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 PROCEDURE: Using biplane roadmap guidance, a Red 62 aspiration catheter was navigated over Colossus 35 microguidewire into the cavernous segment of the left ICA. The aspiration catheter was then advanced to the level of occlusion and connected to an aspiration pump. Continuous aspiration was performed for 2 minutes. The guide catheter was connected to a VacLok syringe and the guiding catheter balloon was inflated. The aspiration catheter was subsequently removed under constant aspiration. The guide catheter was aspirated for debris. Left internal carotid artery angiograms with frontal and lateral views of the head showed complete recanalization of the left MCA vascular tree. The guide catheter was retracted into the neck. Frontal and lateral angiograms of the neck were obtained. Improvement of the degree of stenosis in the carotid bulb compared to prior CT angiogram. There is prominent luminal irregularity at the carotid bulb residual moderate stenosis. Increased tortuosity of the proximal/mid cervical left ICA with kinking. Left internal carotid artery angiograms with left anterior oblique views of the neck showed evidence of filling defects at the level of the carotid bulb. Flat panel CT of the head was obtained and post processed in a separate workstation with concurrent attending physician supervision. Selected images were sent to PACS. No evidence of hemorrhagic complication. There is mild contrast staining of the left insular and frontal cortex. Repeat left internal carotid artery angiograms with frontal and lateral views of the head showed Amy seen left M3/MCA branch to the left parietal region. Left  common carotid artery angiograms with frontal and lateral views of the neck showed vertebra Gretchen of stenosis at the left ICA bulb with more prominent filling defect. At this point, patient was loaded on cangrelor followed by continuous drip. Using biplane roadmap guidance, a 4-7 mm Emboshield NAV6 cerebral protection device was advanced into the cervical left ICA. However, multiple attempts to advance a cerebral protection device through the left ICA kinking proved unsuccessful. The cerebral protection device was subsequently removed. Using biplane roadmap guidance, a 10-8 x 40 mm XACT carotid stent was navigated and deployed from the distal left common carotid artery to the proximal left internal carotid artery, proximal to  the vessel kinking. Suboptimal stent expansion was noted. Then, a 6 x 30 mm Viatrac balloon was navigated into the recently deployed stent. Angioplasty was performed under fluoroscopy. Left internal carotid artery angiograms with frontal and lateral views of the neck showed adequate stent positioning and expansion with brisk anterograde flow. Left internal carotid artery angiograms with frontal and lateral views of the head showed improvement of anterograde flow in the left MCA vascular tree with brisk anterograde flow. Delayed left common carotid artery angiograms with frontal and lateral views of the neck showed no evidence of clot formation within the stent. The catheter was subsequently Riddle. Right common femoral artery angiogram was obtained in right anterior oblique view. The puncture is at the level of the common femoral artery. The artery has normal caliber, adequate for closure device. The sheath was exchanged over the wire for an 8 [place] Angio-Seal which was utilized for access closure. Immediate hemostasis was achieved. IMPRESSION: 1. Successful mechanical thrombectomy for treatment of a proximal left M2/MCA anterior division branch occlusion achieving complete recanalization  (TICI 3). 2. 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 PLAN: Continue cangrelor infusion until patient is transitioned to oral dual antiplatelet therapy. Electronically Signed   By: Baldemar Lenis M.D.   On: 06/05/2023 13:16   IR US Guide Vasc Access Right Result Date: 06/05/2023 INDICATION: 83 year old male presenting with right-sided weakness and aphasia; NIHSS 28. His last known well was 10 p.m. on 06/04/2023. His past medical history significant for prior stroke, hypertension, diabetes and chronic kidney disease; baseline modified Rankin scale 0. Head CT showed hypodensity within the left insula, basal ganglia and left frontal operculum (ASPECTS 7). No IV thrombolytic given as patient was outside the window. CT angiogram of the head and neck showed an occlusion of a left M2/MCA anterior division branch. CT perfusion showed a 41 mL core infarct with a 45 mL ischemic penumbra. She was transferred to our service for mechanical thrombectomy. EXAM: ULTRASOUND-GUIDED VASCULAR ACCESS DIAGNOSTIC CEREBRAL ANGIOGRAM MECHANICAL THROMBECTOMY FLAT PANEL HEAD CT LEFT CAROTID STENTING AND ANGIOPLASTY WITHOUT CEREBRAL PROTECTION DEVICE COMPARISON:  CT/CT angiogram of the head and neck June 05, 2023. MEDICATIONS: No antibiotics administered. ANESTHESIA/SEDATION: The procedure was performed under general anesthesia. CONTRAST:  80 mL of Omnipaque 300 milligram/mL FLUOROSCOPY: Radiation Exposure Index (as provided by the fluoroscopic device): 1315 mGy Kerma COMPLICATIONS: None immediate. TECHNIQUE: Informed written consent was obtained from the patient's wife after a thorough discussion of the procedural risks, benefits and alternatives. All questions were addressed. Maximal Sterile Barrier Technique was utilized including caps, mask, sterile gowns, sterile gloves,  sterile drape, hand hygiene and skin antiseptic. A timeout was performed prior to the initiation of the procedure. The right groin was prepped and draped in the usual sterile fashion. Using a micropuncture kit and the modified Seldinger technique, access was gained to the right common femoral artery and an 8 French sheath was placed. Real-time ultrasound guidance was utilized for vascular access including the acquisition of a permanent ultrasound image documenting patency of the accessed vessel. Under fluoroscopy, an 8 [place] Walrus balloon guide catheter was navigated over a 6 [place] VTK catheter and a 0.035" Terumo Glidewire into the aortic arch. The catheter was placed into the left common carotid artery and then advanced into the left internal carotid artery. The diagnostic catheter was removed. Frontal and lateral angiograms of the head were obtained. FINDINGS:  1. Ultrasound showed heavily calcified right common femoral artery is maintained patency and caliber. 2. Proximal occlusion of a left M2/MCA anterior division branch. 3. Atherosclerotic changes of the intracranial left ICA with mild stenosis at the distal cavernous segment. 4. 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 PROCEDURE: Using biplane roadmap guidance, a Red 62 aspiration catheter was navigated over Colossus 35 microguidewire into the cavernous segment of the left ICA. The aspiration catheter was then advanced to the level of occlusion and connected to an aspiration pump. Continuous aspiration was performed for 2 minutes. The guide catheter was connected to a VacLok syringe and the guiding catheter balloon was inflated. The aspiration catheter was subsequently removed under constant aspiration. The guide catheter was aspirated for debris. Left internal carotid artery angiograms with frontal and lateral views of the head showed complete  recanalization of the left MCA vascular tree. The guide catheter was retracted into the neck. Frontal and lateral angiograms of the neck were obtained. Improvement of the degree of stenosis in the carotid bulb compared to prior CT angiogram. There is prominent luminal irregularity at the carotid bulb residual moderate stenosis. Increased tortuosity of the proximal/mid cervical left ICA with kinking. Left internal carotid artery angiograms with left anterior oblique views of the neck showed evidence of filling defects at the level of the carotid bulb. Flat panel CT of the head was obtained and post processed in a separate workstation with concurrent attending physician supervision. Selected images were sent to PACS. No evidence of hemorrhagic complication. There is mild contrast staining of the left insular and frontal cortex. Repeat left internal carotid artery angiograms with frontal and lateral views of the head showed Amy seen left M3/MCA branch to the left parietal region. Left common carotid artery angiograms with frontal and lateral views of the neck showed vertebra Gretchen of stenosis at the left ICA bulb with more prominent filling defect. At this point, patient was loaded on cangrelor followed by continuous drip. Using biplane roadmap guidance, a 4-7 mm Emboshield NAV6 cerebral protection device was advanced into the cervical left ICA. However, multiple attempts to advance a cerebral protection device through the left ICA kinking proved unsuccessful. The cerebral protection device was subsequently removed. Using biplane roadmap guidance, a 10-8 x 40 mm XACT carotid stent was navigated and deployed from the distal left common carotid artery to the proximal left internal carotid artery, proximal to the vessel kinking. Suboptimal stent expansion was noted. Then, a 6 x 30 mm Viatrac balloon was navigated into the recently deployed stent. Angioplasty was performed under fluoroscopy. Left internal carotid artery  angiograms with frontal and lateral views of the neck showed adequate stent positioning and expansion with brisk anterograde flow. Left internal carotid artery angiograms with frontal and lateral views of the head showed improvement of anterograde flow in the left MCA vascular tree with brisk anterograde flow. Delayed left common carotid artery angiograms with frontal and lateral views of the neck showed no evidence of clot formation within the stent. The catheter was subsequently Riddle. Right common femoral artery angiogram was obtained in right anterior oblique view. The puncture is at the level of the common femoral artery. The artery has normal caliber, adequate for closure device. The sheath was exchanged over the wire for an 8 [place] Angio-Seal which was utilized for access closure. Immediate hemostasis was achieved. IMPRESSION: 1. Successful mechanical thrombectomy for treatment of a proximal  left M2/MCA anterior division branch occlusion achieving complete recanalization (TICI 3). 2. 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 PLAN: Continue cangrelor infusion until patient is transitioned to oral dual antiplatelet therapy. Electronically Signed   By: Baldemar Lenis M.D.   On: 06/05/2023 13:16   IR INTRAVSC STENT CERV CAROTID W/O EMB-PROT MOD SED Result Date: 06/05/2023 INDICATION: 83 year old male presenting with right-sided weakness and aphasia; NIHSS 28. His last known well was 10 p.m. on 06/04/2023. His past medical history significant for prior stroke, hypertension, diabetes and chronic kidney disease; baseline modified Rankin scale 0. Head CT showed hypodensity within the left insula, basal ganglia and left frontal operculum (ASPECTS 7). No IV thrombolytic given as patient was outside the window. CT angiogram of the head and neck showed an  occlusion of a left M2/MCA anterior division branch. CT perfusion showed a 41 mL core infarct with a 45 mL ischemic penumbra. She was transferred to our service for mechanical thrombectomy. EXAM: ULTRASOUND-GUIDED VASCULAR ACCESS DIAGNOSTIC CEREBRAL ANGIOGRAM MECHANICAL THROMBECTOMY FLAT PANEL HEAD CT LEFT CAROTID STENTING AND ANGIOPLASTY WITHOUT CEREBRAL PROTECTION DEVICE COMPARISON:  CT/CT angiogram of the head and neck June 05, 2023. MEDICATIONS: No antibiotics administered. ANESTHESIA/SEDATION: The procedure was performed under general anesthesia. CONTRAST:  80 mL of Omnipaque 300 milligram/mL FLUOROSCOPY: Radiation Exposure Index (as provided by the fluoroscopic device): 1315 mGy Kerma COMPLICATIONS: None immediate. TECHNIQUE: Informed written consent was obtained from the patient's wife after a thorough discussion of the procedural risks, benefits and alternatives. All questions were addressed. Maximal Sterile Barrier Technique was utilized including caps, mask, sterile gowns, sterile gloves, sterile drape, hand hygiene and skin antiseptic. A timeout was performed prior to the initiation of the procedure. The right groin was prepped and draped in the usual sterile fashion. Using a micropuncture kit and the modified Seldinger technique, access was gained to the right common femoral artery and an 8 French sheath was placed. Real-time ultrasound guidance was utilized for vascular access including the acquisition of a permanent ultrasound image documenting patency of the accessed vessel. Under fluoroscopy, an 8 [place] Walrus balloon guide catheter was navigated over a 6 [place] VTK catheter and a 0.035" Terumo Glidewire into the aortic arch. The catheter was placed into the left common carotid artery and then advanced into the left internal carotid artery. The diagnostic catheter was removed. Frontal and lateral angiograms of the head were obtained. FINDINGS: 1. Ultrasound showed heavily calcified right common  femoral artery is maintained patency and caliber. 2. Proximal occlusion of a left M2/MCA anterior division branch. 3. Atherosclerotic changes of the intracranial left ICA with mild stenosis at the distal cavernous segment. 4. 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 PROCEDURE: Using biplane roadmap guidance, a Red 62 aspiration catheter was navigated over Colossus 35 microguidewire into the cavernous segment of the left ICA. The aspiration catheter was then advanced to the level of occlusion and connected to an aspiration pump. Continuous aspiration was performed for 2 minutes. The guide catheter was connected to a VacLok syringe and the guiding catheter balloon was inflated. The aspiration catheter was subsequently removed under constant aspiration. The guide catheter was aspirated for debris. Left internal carotid artery angiograms with frontal and lateral views of the head showed complete recanalization of the left MCA vascular tree. The guide  catheter was retracted into the neck. Frontal and lateral angiograms of the neck were obtained. Improvement of the degree of stenosis in the carotid bulb compared to prior CT angiogram. There is prominent luminal irregularity at the carotid bulb residual moderate stenosis. Increased tortuosity of the proximal/mid cervical left ICA with kinking. Left internal carotid artery angiograms with left anterior oblique views of the neck showed evidence of filling defects at the level of the carotid bulb. Flat panel CT of the head was obtained and post processed in a separate workstation with concurrent attending physician supervision. Selected images were sent to PACS. No evidence of hemorrhagic complication. There is mild contrast staining of the left insular and frontal cortex. Repeat left internal carotid artery angiograms with frontal and lateral views of the head showed  Amy seen left M3/MCA branch to the left parietal region. Left common carotid artery angiograms with frontal and lateral views of the neck showed vertebra Gretchen of stenosis at the left ICA bulb with more prominent filling defect. At this point, patient was loaded on cangrelor followed by continuous drip. Using biplane roadmap guidance, a 4-7 mm Emboshield NAV6 cerebral protection device was advanced into the cervical left ICA. However, multiple attempts to advance a cerebral protection device through the left ICA kinking proved unsuccessful. The cerebral protection device was subsequently removed. Using biplane roadmap guidance, a 10-8 x 40 mm XACT carotid stent was navigated and deployed from the distal left common carotid artery to the proximal left internal carotid artery, proximal to the vessel kinking. Suboptimal stent expansion was noted. Then, a 6 x 30 mm Viatrac balloon was navigated into the recently deployed stent. Angioplasty was performed under fluoroscopy. Left internal carotid artery angiograms with frontal and lateral views of the neck showed adequate stent positioning and expansion with brisk anterograde flow. Left internal carotid artery angiograms with frontal and lateral views of the head showed improvement of anterograde flow in the left MCA vascular tree with brisk anterograde flow. Delayed left common carotid artery angiograms with frontal and lateral views of the neck showed no evidence of clot formation within the stent. The catheter was subsequently Riddle. Right common femoral artery angiogram was obtained in right anterior oblique view. The puncture is at the level of the common femoral artery. The artery has normal caliber, adequate for closure device. The sheath was exchanged over the wire for an 8 [place] Angio-Seal which was utilized for access closure. Immediate hemostasis was achieved. IMPRESSION: 1. Successful mechanical thrombectomy for treatment of a proximal left M2/MCA anterior  division branch occlusion achieving complete recanalization (TICI 3). 2. 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 PLAN: Continue cangrelor infusion until patient is transitioned to oral dual antiplatelet therapy. Electronically Signed   By: Baldemar Lenis M.D.   On: 06/05/2023 13:16   IR CT Head Ltd Result Date: 06/05/2023 INDICATION: 83 year old male presenting with right-sided weakness and aphasia; NIHSS 28. His last known well was 10 p.m. on 06/04/2023. His past medical history significant for prior stroke, hypertension, diabetes and chronic kidney disease; baseline modified Rankin scale 0. Head CT showed hypodensity within the left insula, basal ganglia and left frontal operculum (ASPECTS 7). No IV thrombolytic given as patient was outside the window. CT angiogram of the head and neck showed an occlusion of a left M2/MCA anterior division branch. CT perfusion showed a 41 mL core infarct with a 45  mL ischemic penumbra. She was transferred to our service for mechanical thrombectomy. EXAM: ULTRASOUND-GUIDED VASCULAR ACCESS DIAGNOSTIC CEREBRAL ANGIOGRAM MECHANICAL THROMBECTOMY FLAT PANEL HEAD CT LEFT CAROTID STENTING AND ANGIOPLASTY WITHOUT CEREBRAL PROTECTION DEVICE COMPARISON:  CT/CT angiogram of the head and neck June 05, 2023. MEDICATIONS: No antibiotics administered. ANESTHESIA/SEDATION: The procedure was performed under general anesthesia. CONTRAST:  80 mL of Omnipaque 300 milligram/mL FLUOROSCOPY: Radiation Exposure Index (as provided by the fluoroscopic device): 1315 mGy Kerma COMPLICATIONS: None immediate. TECHNIQUE: Informed written consent was obtained from the patient's wife after a thorough discussion of the procedural risks, benefits and alternatives. All questions were addressed. Maximal Sterile Barrier Technique was utilized including  caps, mask, sterile gowns, sterile gloves, sterile drape, hand hygiene and skin antiseptic. A timeout was performed prior to the initiation of the procedure. The right groin was prepped and draped in the usual sterile fashion. Using a micropuncture kit and the modified Seldinger technique, access was gained to the right common femoral artery and an 8 French sheath was placed. Real-time ultrasound guidance was utilized for vascular access including the acquisition of a permanent ultrasound image documenting patency of the accessed vessel. Under fluoroscopy, an 8 [place] Walrus balloon guide catheter was navigated over a 6 [place] VTK catheter and a 0.035" Terumo Glidewire into the aortic arch. The catheter was placed into the left common carotid artery and then advanced into the left internal carotid artery. The diagnostic catheter was removed. Frontal and lateral angiograms of the head were obtained. FINDINGS: 1. Ultrasound showed heavily calcified right common femoral artery is maintained patency and caliber. 2. Proximal occlusion of a left M2/MCA anterior division branch. 3. Atherosclerotic changes of the intracranial left ICA with mild stenosis at the distal cavernous segment. 4. 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 PROCEDURE: Using biplane roadmap guidance, a Red 62 aspiration catheter was navigated over Colossus 35 microguidewire into the cavernous segment of the left ICA. The aspiration catheter was then advanced to the level of occlusion and connected to an aspiration pump. Continuous aspiration was performed for 2 minutes. The guide catheter was connected to a VacLok syringe and the guiding catheter balloon was inflated. The aspiration catheter was subsequently removed under constant aspiration. The guide catheter was aspirated for debris. Left internal carotid artery angiograms with frontal and  lateral views of the head showed complete recanalization of the left MCA vascular tree. The guide catheter was retracted into the neck. Frontal and lateral angiograms of the neck were obtained. Improvement of the degree of stenosis in the carotid bulb compared to prior CT angiogram. There is prominent luminal irregularity at the carotid bulb residual moderate stenosis. Increased tortuosity of the proximal/mid cervical left ICA with kinking. Left internal carotid artery angiograms with left anterior oblique views of the neck showed evidence of filling defects at the level of the carotid bulb. Flat panel CT of the head was obtained and post processed in a separate workstation with concurrent attending physician supervision. Selected images were sent to PACS. No evidence of hemorrhagic complication. There is mild contrast staining of the left insular and frontal cortex. Repeat left internal carotid artery angiograms with frontal and lateral views of the head showed Amy seen left M3/MCA branch to the left parietal region. Left common carotid artery angiograms with frontal and lateral views of the neck showed vertebra Gretchen of stenosis at the left ICA bulb with more prominent filling defect.  At this point, patient was loaded on cangrelor followed by continuous drip. Using biplane roadmap guidance, a 4-7 mm Emboshield NAV6 cerebral protection device was advanced into the cervical left ICA. However, multiple attempts to advance a cerebral protection device through the left ICA kinking proved unsuccessful. The cerebral protection device was subsequently removed. Using biplane roadmap guidance, a 10-8 x 40 mm XACT carotid stent was navigated and deployed from the distal left common carotid artery to the proximal left internal carotid artery, proximal to the vessel kinking. Suboptimal stent expansion was noted. Then, a 6 x 30 mm Viatrac balloon was navigated into the recently deployed stent. Angioplasty was performed under  fluoroscopy. Left internal carotid artery angiograms with frontal and lateral views of the neck showed adequate stent positioning and expansion with brisk anterograde flow. Left internal carotid artery angiograms with frontal and lateral views of the head showed improvement of anterograde flow in the left MCA vascular tree with brisk anterograde flow. Delayed left common carotid artery angiograms with frontal and lateral views of the neck showed no evidence of clot formation within the stent. The catheter was subsequently Riddle. Right common femoral artery angiogram was obtained in right anterior oblique view. The puncture is at the level of the common femoral artery. The artery has normal caliber, adequate for closure device. The sheath was exchanged over the wire for an 8 [place] Angio-Seal which was utilized for access closure. Immediate hemostasis was achieved. IMPRESSION: 1. Successful mechanical thrombectomy for treatment of a proximal left M2/MCA anterior division branch occlusion achieving complete recanalization (TICI 3). 2. 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 PLAN: Continue cangrelor infusion until patient is transitioned to oral dual antiplatelet therapy. Electronically Signed   By: Baldemar Lenis M.D.   On: 06/05/2023 13:16   CT ANGIO HEAD NECK W WO CM W PERF (CODE STROKE) Addendum Date: 06/05/2023 ADDENDUM REPORT: 06/05/2023 12:25 ADDENDUM: Please note, there is a dictation error within CTA neck impression #1, which should read: The common carotid and internal carotid arteries are patent within the neck. Atherosclerotic plaque bilaterally. Most notably, there is progressive atherosclerotic plaque about the left carotid bifurcation and within the proximal left ICA with resultant severe near occlusive stenosis of the proximal left ICA.  Also of note, atherosclerotic plaque about the right carotid bifurcation results in a 40% stenosis at the right ICA origin. Electronically Signed   By: Jackey Loge D.O.   On: 06/05/2023 12:25   Result Date: 06/05/2023 CLINICAL DATA:  Provided history: Cerebrovascular accident, unspecified mechanism. Right-sided weakness. Right-sided facial droop. Altered mental status. EXAM: CT ANGIOGRAPHY HEAD AND NECK CT PERFUSION BRAIN TECHNIQUE: Multidetector CT imaging of the head and neck was performed using the standard protocol during bolus administration of intravenous contrast. Multiplanar CT image reconstructions and MIPs were obtained to evaluate the vascular anatomy. Carotid stenosis measurements (when applicable) are obtained utilizing NASCET criteria, using the distal internal carotid diameter as the denominator. Multiphase CT imaging of the brain was performed following IV bolus contrast injection. Subsequent parametric perfusion maps were calculated using RAPID software. RADIATION DOSE REDUCTION: This exam was performed according to the departmental dose-optimization program which includes automated exposure control, adjustment of the mA and/or kV according to patient size and/or use of iterative reconstruction technique. CONTRAST:  OMNIPAQUE IOHEXOL 350 MG/ML SOLN COMPARISON:  Noncontrast head CT performed earlier today 06/05/2023. MRA head and MRA  neck 10/04/2019. FINDINGS: CTA NECK FINDINGS Aortic arch: Common origin of the innominate and left common carotid arteries. Atherosclerotic plaque within the visualized thoracic aorta and proximal major branch vessels of the neck. Streak/beam hardening artifact arising from a dense right-sided contrast bolus partially obscures the right subclavian artery. Within this limitation, there is no appreciable hemodynamically significant innominate or proximal subclavian artery stenosis. Right carotid system: CCA and ICA patent within the neck. Atherosclerotic plaque,  greatest about the carotid bifurcation. Resultant 40% stenosis at the ICA origin. Left carotid system: CCA and ICA patent within the neck. Atherosclerotic plaque. Most notably, there is prominent atherosclerotic plaque about the carotid bifurcation and within the proximal ICA which has progressed from the prior MRA neck of 10/04/2019. Resultant severe (near occlusive) stenosis of the proximal ICA. Tortuosity of the cervical ICA Vertebral arteries: The vertebral arteries are patent within the neck. Streak/beam hardening artifact limits evaluation of the right vertebral artery origin. At least moderate stenosis is suspected at this site. Atherosclerotic plaque scattered elsewhere within the cervical right vertebral artery with no more than mild stenosis. Calcified atherosclerotic plaque at the left vertebral artery origin with suspected at least moderate stenosis. Nonstenotic calcified plaque elsewhere within the cervical left vertebral artery. Skeleton: Cervical spondylosis. Other neck: No neck mass or cervical lymphadenopathy. Upper chest: No consolidation within the imaged lung apices. Review of the MIP images confirms the above findings CTA HEAD FINDINGS Anterior circulation: The intracranial internal carotid arteries are patent. As sclerotic plaque within both vessels. No more than mild stenosis on the right. Up to moderate stenosis within the left cavernous segment. The M1 middle cerebral arteries are patent. Abrupt occlusion of a proximal M2 left middle cerebral artery vessel (series 11, image 22). Atherosclerotic irregularity of the M2 and more distal MCA vessels elsewhere. The anterior cerebral arteries are patent. Atherosclerotic irregularity of both vessels without high-grade proximal stenosis. A possible 2 mm periophthalmic left ICA aneurysm with better appreciated on the prior MRA head of 10/04/2019. Posterior circulation: The intracranial vertebral arteries are patent. Atherosclerotic plaque within the  right V4 segment sites of mild stenosis. Non-stenotic atherosclerotic plaque within the left V4 segment. The basilar artery is patent. The posterior cerebral arteries are patent. Posterior communicating arteries are diminutive or absent, bilaterally. Venous sinuses: Assessment for dural venous sinus thrombosis is limited due to contrast timing. Anatomic variants: As described. Review of the MIP images confirms the above findings CT Brain Perfusion Findings: ASPECTS: CBF (<30%) Volume: 41mL Perfusion (Tmax>6.0s) volume: 86mL Mismatch Volume: 45mL Infarction Location:Left MCA vascular territory CTA head impression #1, the CT perfusion head impression and the presence of a severe stenosis of the proximal cervical left ICA called by telephone at the time of interpretation on 06/05/2023 at 8:40 am to provider ERIC [facility] , who verbally acknowledged these results. IMPRESSION: CTA neck: 1. No common carotid and internal carotid arteries are patent within the neck. Atherosclerotic plaque bilaterally. Most notably, there is progressive atherosclerotic plaque about the left carotid bifurcation and within the proximal left ICA with resultant severe, near occlusive stenosis of the proximal left ICA. Also of note, atherosclerotic plaque about the right carotid bifurcation results in 40% stenosis at the right ICA origin. 2. The vertebral arteries are patent within the neck. Atherosclerotic plaque bilaterally as described. Most notably, there is suspected at least moderate stenoses at the bilateral vertebral artery origins. 3. Aortic Atherosclerosis (ICD10-I70.0). CTA head: 1. Abrupt occlusion of a proximal M2 left middle cerebral artery vessel. 2. Background intracranial atherosclerotic  disease as described. 3. A possible 2 mm periophthalmic left ICA aneurysm was better appreciated on the prior MRA head of 10/04/2019. CT perfusion head: The perfusion software identifies a 41 mL core infarct in the left MCA vascular territory. The  perfusion software identifies an 86 mL region of critically hypoperfused parenchyma within the left MCA vascular territory (utilizing the Tmax>6 seconds threshold). Reported mismatch volume: 45 mL Electronically Signed: By: Jackey Loge D.O. On: 06/05/2023 09:07   CT HEAD CODE STROKE WO CONTRAST Result Date: 06/05/2023 CLINICAL DATA:  Code stroke. Neuro deficit, acute, stroke suspected. EXAM: CT HEAD WITHOUT CONTRAST TECHNIQUE: Contiguous axial images were obtained from the base of the skull through the vertex without intravenous contrast. RADIATION DOSE REDUCTION: This exam was performed according to the departmental dose-optimization program which includes automated exposure control, adjustment of the mA and/or kV according to patient size and/or use of iterative reconstruction technique. COMPARISON:  Brain MRI 10/04/2019.  Noncontrast head CT 10/03/2019. FINDINGS: Brain: Generalized cerebral atrophy. Loss of gray-white differentiation consistent with an acute infarct within the left insula and within portions of the left frontal operculum (MCA vascular territory). Known small chronic cortically-based infarcts within the left frontal, left parietal and left occipital lobes were better appreciated on the prior brain MRI of 10/04/2019 (acute at that time). Mild patchy and ill-defined hypoattenuation within the cerebral white matter, nonspecific but compatible with chronic small vessel ischemic disease. Subcentimeter infarct within the superior right cerebellar hemisphere, new from the prior MRI but chronic in appearance. Loss of gray-white differentiation there is no acute intracranial hemorrhage. No extra-axial fluid collection. No evidence of an intracranial mass. No midline shift. Vascular: No hyperdense vessel.  Atherosclerotic calcifications. Skull: No calvarial fracture or aggressive osseous lesion. Sinuses/Orbits: No mass or acute finding within the imaged orbits. No significant paranasal sinus disease.  ASPECTS [facility] Stroke Program Early CT Score) - Ganglionic level infarction (caudate, lentiform nuclei, internal capsule, insula, M1-M3 cortex): 5 - Supraganglionic infarction (M4-M6 cortex): 2 Total score (0-10 with 10 being normal): 7 Impression #1 called by telephone at the time of interpretation on 06/05/2023 at 8:40 am to provider Dr. Otelia Limes, who verbally acknowledged these results. IMPRESSION: 1. Acute left MCA territory infarct affecting the left insula and portions of the left frontal operculum. ASPECTS is 7. 2. Known small chronic cortically-based infarcts within the left frontal, left parietal and left occipital lobes were better appreciated on the prior brain MRI of 10/04/2019 (acute at that time). 3. Background mild cerebral white matter chronic small vessel ischemic disease. 4. Subcentimeter infarct within the right cerebellar hemisphere, new from prior MRI but chronic in appearance. 5. Generalized cerebral atrophy. Electronically Signed   By: Jackey Loge D.O.   On: 06/05/2023 08:45     PHYSICAL EXAM  Temp:  [96.3 F (35.7 C)-98.5 F (36.9 C)] 97.9 F (36.6 C) (02/24 1200) Pulse Rate:  [57-93] 77 (02/24 1300) Resp:  [22-36] 25 (02/24 1300) BP: (86-185)/(18-89) 125/55 (02/24 1300) SpO2:  [90 %-99 %] 96 % (02/24 1300) FiO2 (%):  [30 %-50 %] 50 % (02/24 1038) Weight:  [137.7 kg] 137.7 kg (02/24 0500)  General -intubated elderly patient in no acute distress  Cardiovascular -regular rhythm on monitor  Neuro: Still intubated and vent dependent.  On Precedex.  He is unresponsive and there is follow commands.  Keeps eyes closed.  With forced eye opening appears to have upward gaze.  No blink to threat.  Pupils appear to be reactive bilaterally.  Cough and gag  present.  Motor: Withdrawal to pain on the left side.     ASSESSMENT/PLAN Mr. Duey Liller. is a 83 y.o. male with history of hypertension, diabetes, CKD 3, stroke admitted for right-sided weakness numbness,  aphasia, right facial droop, left gaze preference. No TNK given due to outside window.  Patient underwent mechanical thrombectomy with left ICA stenting.   2/15 in the morning patient had an episode of vomiting with coffee-ground emesis and subsequent tachycardia and tachypnea.  On evaluation this morning, he had increased work of breathing and was unable to maintain his SpO2 even on 100% O2 via nonrebreather.  Discussion was had with patient's wife and daughter, and family stated they would like to proceed with intubation and would like for patient to remain full code at this time.  CCM was consulted, and patient was transferred to ICU and intubated.  He did have an episode of aspiration during intubation and bronchoscopy was subsequently performed.  He was noted to be hypotensive after intubation, and Levophed and vasopressin were started.  Will hold Plavix for now given coffee-ground emesis.  Patient's creatinine was also noted to have worsened. Due to her worsening kidney function and need for CRRT, CCM will be primary on the patient.   Stroke:  left MCA infarct with left M2 occlusion and left ICA near occlusion s/p IR with TICI3 and left ICA stenting, likely secondary to large vessel disease source versus cardiomyopathy with low EF CT left MCA infarct CT head and neck left M2 occlusion, left ICA near occlusion, right ICA 43 stenosis, bilateral VA origin severe stenosis CTP 41/86 Status post IR with TICI3 and left ICA stenting MRI left MCA infarct at left insular and left frontal operculum, prominent and confluent petechial hemorrhagic transformation CT repeat 2/7 stable confluent petechial hemorrhage 2D Echo EF 30% LDL 107 HgbA1c 6.1 P2 Y12 = 73 UDS negative SCDs for VTE prophylaxis aspirin 81 mg daily and clopidogrel 75 mg daily prior to admission, now on ASA and Plavix with stable hemoglobin. Ongoing aggressive stroke risk factor management Therapy recommendations:  SNF Disposition: Pending    History of stroke 10/08/2019 admitted for left MCA infarct due to right upper extremity weakness.  MRA head and neck showed left ICA 30% stenosis.  EF 45 to 50%.  LDL 105, A1c 5.6.  Recommended loop recorder at that time but only got 30-day CardioNet monitoring which was no A-fib.  Discharged on DAPT and Lipitor 80.  Respiratory failure Leukocytosis On 2/15, patient had episode of emesis with probable subsequent aspiration and went into respiratory distress with increased work of breathing unable to maintain SpO2 After discussion with family, patient intubated for airway protection and given respiratory failure Now on precedex and fentanyl Ventilator management per CCM - failed SBT 2/19 Fever Tmax 102.5 WBC 10.1--23.5--44.5--41.4--29.9--19.5--7.4--4.9--5.7-no CBC from today. Now  cefepime-> Rocephin treatment completed. Off vancomycin  GI bleeding Acute blood loss anemia Patient had 2 episodes of coffee-ground emesis, likely stress ulcer Resume Plavix 2/18 Resume ASA 2/20  Hemoglobin 11.2-> 8.4->PRBC->8.8->8.1-> 7.6->8.3-> 8.6->7.6>8.7 Close monitoring  Cardiomyopathy CHF 09/2019 EF 45 to 50% Current admission EF 30% Cardiology on board, appreciate assistance On DAPT, Agree with GDMT (losartan, entresto, metoprolol and spironolactone) as BP and Cr tolerates  Diabetes HgbA1c 6.1 goal < 7.0 Hyperglycemia improved  Now off insulin drip CBG monitoring SSI  DM education and close PCP follow up  History of hypertension, now hypotensive Stable now But requiring Levophed Off epi and vasopressin Long term BP goal normotensive  Hyperlipidemia Home meds: Lipitor 80 LDL 105, goal < 70 Now on lipitor 80 and Zetia  Continue statin and zetia at discharge  Dysphagia Poststroke dysphagia Speech on board N.p.o. now OG tube reinserted Start trickle feeds 2/18  AKI on CKD  Creatinine 1.78->3.41-> 4.61->4.07-> 3.85->3.66->4.19->5.66 --> 6.92 Baseline creatinine around  1.5 Renally dose medications as appropriate Avoid contrast Aggressive treat hypotension Nephrology following now on CCRT  Other Stroke Risk Factors Advanced age Obesity, Body mass index is 43.56 kg/m.   Other Active Problems Presyncopal episode with mild hypotension 2/14-suspect was due to straining to have a bowel movement, will prescribe as needed suppository  Hospital day # 19  Essentially no change in exam.  Still intubated.  CCM plans to diurese and possibly wean vent.  Low-dose Seroquel added.  He is he is on CCRT per nephrology likely lifelong.  Discussed with Dr. Brunilda Payor appreciate their assistance.  Discussed with granddaughter and will take time to see how he improves determine whether he will need nursing home or can live with assisted living at home.  Palliative care is on board to help with goals of care.  Continue full code for now.    This patient is critically ill due to respiratory distress, left MCA stroke s/p IR intervention worsening kidney function and at significant risk of neurological worsening, death form heart failure, respiratory failure, recurrent stroke, bleeding from [facility], seizure, sepsis. This patient's care requires constant monitoring of vital signs, hemodynamics, respiratory and cardiac monitoring, review of multiple databases, neurological assessment, discussion with family, other specialists and medical decision making of high complexity. I spent 35 minutes of neurocritical care time in the care of this patient.   Tekla Malachowski,MD

## 2023-06-24 NOTE — TOC Progression Note (Signed)
 Transition of Care (TOC) - Progression Note    Patient Details  Name: Nathan Proctor. MRN: [id number] Date of Birth: 1940-12-20  Transition of Care [facility]) CM/SW Contact  Mearl Latin, LCSW Phone Number: 06/24/2023, 9:40 AM  Clinical Narrative:    CSW continuing to follow. Patient remains intubated; trial CRRT.    Expected Discharge Plan: Skilled Nursing Facility Barriers to Discharge: Continued Medical Work up, English as a second language teacher  Expected Discharge Plan and Services In-house Referral: Clinical Social Work   Post Acute Care Choice: Skilled Nursing Facility Living arrangements for the past 2 months: Single Family Home                                       Social Determinants of Health (SDOH) Interventions SDOH Screenings   Food Insecurity: Patient Unable To Answer (06/07/2023)  Housing: Patient Unable To Answer (06/07/2023)  Transportation Needs: Patient Unable To Answer (06/07/2023)  Utilities: Patient Unable To Answer (06/07/2023)  Depression (PHQ2-9): Low Risk  (12/03/2019)  Social Connections: Patient Unable To Answer (06/07/2023)  Tobacco Use: Medium Risk (06/13/2023)    Readmission Risk Interventions     No data to display

## 2023-06-24 NOTE — Progress Notes (Signed)
 NAME:  Nathan Meschke., MRN:  [id number], DOB:  1940-07-28, LOS: 19 ADMISSION DATE:  06/05/2023 CONSULTATION DATE:  06/05/2023 REFERRING MD:  Otelia Limes - Neuro, CHIEF COMPLAINT: Code Stroke, post-NIR   History of Present Illness:  83 year old man who presented to [facility] ED 2/5 via EMS for unresponsiveness and R-sided deficits. PMHx significant for HTN, HLD, prior CVA without residual deficits, T2DM, CKD stage IIIb, prostate CA, gout.  Patient presented to [facility] ED with significant R-sided deficits, aphasia and poor responsiveness. LKW 2200. Code Stroke initiated. On ED arrival, patient was afebrile with HR 86, BP 154/67, RR 20, SpO2 100%. Noted aphasia, R-sided hemiplegia, R-sided facial droop. Labs were notable for WBC 6.8. Hgb 11.9, Plt 181. INR 1.0. Na 142, K 3.8, CO2 20, Cr 1.59 (baseline), LFTs WNL. Ethanol < 10. COVID negative. CT Head with acute L MCA territory infarct affecting L insula/L frontal operculum, small chronic cortically-based infarcts of L frontal/L parietal/L occipital lobes. CTA Head/Neck with abrupt occlusion of proximal M2 L MCA vessel, ?2mm periophthalmic L ICA aneurysm, bilateral ICA plaque with progressive atherosclerotic plaque L carotid bifurcation with proximal L ICA severe, near-occlusive stenosis.   Taken to [facility] emergently for mechanical thrombectomy ([name]). TICI 3 revascularization achieved. L ICA stenosis and plaque rupture noted at the bulb, treated with stenting/angioplasty. Extubated post-procedure and maintained on cangrelor gtt with plan for transition to DAPT.  PCCM consulted for post-procedure medical management.  Pertinent Medical History:   Past Medical History:  Diagnosis Date   Anemia of chronic disease    Ankle fracture 02/15/2016   Cancer (HCC)    Prostate   Chronic constipation    Chronic kidney disease    stage III   Diabetes mellitus without complication (HCC)    type II    Diabetic peripheral neuropathy (HCC)    Failure to  thrive (0-17)    Fracture of left lower leg    Gout    Hyperlipidemia    Hypertension    Morbid obesity (HCC)    Unstable gait    Significant Hospital Events: Including procedures, antibiotic start and stop dates in addition to other pertinent events   2/5 - Presented to [facility] ED with poor responsiveness, aphasia, R-sided deficits. CT Head with L MCA territory infarct. CTA Head/Neck with occlusion of L MCA M2 segment and severe near-occlusive stenosis of L ICA. Taken to [facility] for MT, stent and angioplasty ([name]). PCCM consulted for post-procedure management. 2/6 transfer out of ICU 2/15 aspiration event early a.m. resulting in respiratory distress, transferred back to ICU and emergently intubated with development of severe shock requiring epi, levo, vaso. Transfused PRBC x1 for possible GI bleed 2/16: 3 pressors, weaning. Coox without cardiogenic component. Antibiotics vanc/cefepime. CT CAP with SBO  2/18: weaned off to only levo now which is likely sedation related  2/20: UOP drop overnight with ongoing poor renal function. Weaning sedation to evaluate neuro status.  2/22: started on trial of CRRT 2/23: HD catheter exchanged for longer one - flow improved. More awake by afternoon  Interim History / Subjective:  Off sedation, patient is awake and following commands. Has become tachypneic off sedation.    Objective:  Blood pressure (!) 185/84, pulse 93, temperature (!) 96.3 F (35.7 C), temperature source Axillary, resp. rate (!) 36, height 5\' 10"  (1.778 m), weight (!) 137.7 kg, SpO2 90%.    Vent Mode: CPAP;PSV FiO2 (%):  [30 %-40 %] 30 % Set Rate:  [22 bmp] 22 bmp  PEEP:  [5 cmH20-8 cmH20] 5 cmH20 Pressure Support:  [8 cmH20] 8 cmH20 Plateau Pressure:  [16 cmH20] 16 cmH20   Intake/Output Summary (Last 24 hours) at 06/24/2023 1036 Last data filed at 06/24/2023 1000 Gross per 24 hour  Intake 3098.83 ml  Output 3756.7 ml  Net -657.87 ml   Filed Weights   06/20/23 0500  06/22/23 0500 06/24/23 0500  Weight: 133 kg (!) 138.5 kg (!) 137.7 kg   Physical Exam: General: obese elderly man.  HENT: ETT and SBFT in place.  Pulmonary: chest clear. Tachypneic and desaturating to upper 80's Cardiovascular: HS normal with warm extremities.  GI: abdomen protuberant. Active bowel sounds. FMS in place.  Extremities: no pitting edema  Neuro: Awake and orients to voice. Follows commands.  GU: Foley removed.   Ancillary Tests Personally Reviewed:  Hyperglycemic on tube feeds Creatinine 2.80, BUN 54 on CRRT  Assessment & Plan:Resolved Hospital Problem List  Acute hypoxemic respiratory failure due to aspiration Klebsiella pneumonia  - treated.  Ileus and swallowing dysfunction from stroke probably contributed.  Left MCA M2 occlusion and left ICA near occlusion now status post thrombectomy with complete revascularization Likely secondary to large vessel obstruction Heart failure with reduced ejection fraction ejection fraction of 30% with global hypokinesis, normal right ventricular function Hypertension Hyperlipidemia Type 2 diabetes Acute kidney injury superimposed on chronic kidney disease stage IIIb Hypernatremia  Hyperchloremia   Plan:  - More awake but likely needs additional fluid removal to tolerate extubation.  - Started low dose Seroquel. Minimize Precedex use - Continue daily SBT - Increase insulin coverage.  - Hold GDMT given episodes of hypotension, titrate norepinephrine to keep MAP>65.  Best Practice (right click and "Reselect all SmartList Selections" daily)   Diet/type: tubefeeds and NPO DVT prophylaxis SCD Pressure ulcer(s): N/A GI prophylaxis: PPI Lines: Central line and yes and it is still needed Foley:  Yes, and it is still needed Code Status:  full code Last date of multidisciplinary goals of care discussion: Continue to update patient and family daily. They are wanting full care at this time.   CRITICAL CARE Performed by: Lynnell Catalan   Total critical care time: 35 minutes  Critical care time was exclusive of separately billable procedures and treating other patients.  Critical care was necessary to treat or prevent imminent or life-threatening deterioration.  Critical care was time spent personally by me on the following activities: development of treatment plan with patient and/or surrogate as well as nursing, discussions with consultants, evaluation of patient's response to treatment, examination of patient, obtaining history from patient or surrogate, ordering and performing treatments and interventions, ordering and review of laboratory studies, ordering and review of radiographic studies, pulse oximetry, re-evaluation of patient's condition and participation in multidisciplinary rounds.  Lynnell Catalan, MD [facility] ICU Physician [facility] [place] [facility]  Pager: [phone] Mobile: [phone] After hours: [phone].

## 2023-06-25 ENCOUNTER — Other Ambulatory Visit (HOSPITAL_COMMUNITY): Payer: Self-pay

## 2023-06-25 DIAGNOSIS — I6389 Other cerebral infarction: Secondary | ICD-10-CM

## 2023-06-25 DIAGNOSIS — R451 Restlessness and agitation: Secondary | ICD-10-CM | POA: Diagnosis not present

## 2023-06-25 DIAGNOSIS — I639 Cerebral infarction, unspecified: Secondary | ICD-10-CM | POA: Diagnosis not present

## 2023-06-25 DIAGNOSIS — N179 Acute kidney failure, unspecified: Secondary | ICD-10-CM | POA: Diagnosis not present

## 2023-06-25 LAB — CBC WITH DIFFERENTIAL/PLATELET
Abs Immature Granulocytes: 0.47 10*3/uL — ABNORMAL HIGH (ref 0.00–0.07)
Basophils Absolute: 0 10*3/uL (ref 0.0–0.1)
Basophils Relative: 0 %
Eosinophils Absolute: 0.1 10*3/uL (ref 0.0–0.5)
Eosinophils Relative: 1 %
HCT: 26.3 % — ABNORMAL LOW (ref 39.0–52.0)
Hemoglobin: 8 g/dL — ABNORMAL LOW (ref 13.0–17.0)
Immature Granulocytes: 3 %
Lymphocytes Relative: 9 %
Lymphs Abs: 1.4 10*3/uL (ref 0.7–4.0)
MCH: 30.7 pg (ref 26.0–34.0)
MCHC: 30.4 g/dL (ref 30.0–36.0)
MCV: 100.8 fL — ABNORMAL HIGH (ref 80.0–100.0)
Monocytes Absolute: 0.9 10*3/uL (ref 0.1–1.0)
Monocytes Relative: 6 %
Neutro Abs: 11.4 10*3/uL — ABNORMAL HIGH (ref 1.7–7.7)
Neutrophils Relative %: 81 %
Platelets: 132 10*3/uL — ABNORMAL LOW (ref 150–400)
RBC: 2.61 MIL/uL — ABNORMAL LOW (ref 4.22–5.81)
RDW: 13.9 % (ref 11.5–15.5)
WBC: 14.3 10*3/uL — ABNORMAL HIGH (ref 4.0–10.5)
nRBC: 0.2 % (ref 0.0–0.2)

## 2023-06-25 LAB — RENAL FUNCTION PANEL
Albumin: 2.1 g/dL — ABNORMAL LOW (ref 3.5–5.0)
Albumin: 2.3 g/dL — ABNORMAL LOW (ref 3.5–5.0)
Anion gap: 8 (ref 5–15)
Anion gap: 11 (ref 5–15)
BUN: 42 mg/dL — ABNORMAL HIGH (ref 8–23)
BUN: 36 mg/dL — ABNORMAL HIGH (ref 8–23)
CO2: 20 mmol/L — ABNORMAL LOW (ref 22–32)
CO2: 25 mmol/L (ref 22–32)
Calcium: 7.9 mg/dL — ABNORMAL LOW (ref 8.9–10.3)
Calcium: 8.9 mg/dL (ref 8.9–10.3)
Chloride: 107 mmol/L (ref 98–111)
Chloride: 102 mmol/L (ref 98–111)
Creatinine, Ser: 2.35 mg/dL — ABNORMAL HIGH (ref 0.61–1.24)
Creatinine, Ser: 2.26 mg/dL — ABNORMAL HIGH (ref 0.61–1.24)
GFR, Estimated: 27 mL/min — ABNORMAL LOW (ref >60)
GFR, Estimated: 28 mL/min — ABNORMAL LOW (ref >60)
Glucose, Bld: 199 mg/dL — ABNORMAL HIGH (ref 70–99)
Glucose, Bld: 231 mg/dL — ABNORMAL HIGH (ref 70–99)
Phosphorus: 3.4 mg/dL (ref 2.5–4.6)
Phosphorus: 3.1 mg/dL (ref 2.5–4.6)
Potassium: 5.1 mmol/L (ref 3.5–5.1)
Potassium: 5.1 mmol/L (ref 3.5–5.1)
Sodium: 135 mmol/L (ref 135–145)
Sodium: 138 mmol/L (ref 135–145)

## 2023-06-25 LAB — GLUCOSE, CAPILLARY
Glucose-Capillary: 148 mg/dL — ABNORMAL HIGH (ref 70–99)
Glucose-Capillary: 179 mg/dL — ABNORMAL HIGH (ref 70–99)
Glucose-Capillary: 184 mg/dL — ABNORMAL HIGH (ref 70–99)
Glucose-Capillary: 210 mg/dL — ABNORMAL HIGH (ref 70–99)
Glucose-Capillary: 256 mg/dL — ABNORMAL HIGH (ref 70–99)
Glucose-Capillary: 257 mg/dL — ABNORMAL HIGH (ref 70–99)

## 2023-06-25 LAB — MAGNESIUM: Magnesium: 2.4 mg/dL (ref 1.7–2.4)

## 2023-06-25 MED ORDER — ALBUMIN HUMAN 5 % IV SOLN
25.0000 g | Freq: Once | INTRAVENOUS | Status: AC
  Administered 2023-06-25: 25 g via INTRAVENOUS
  Filled 2023-06-25: qty 500

## 2023-06-25 MED ORDER — QUETIAPINE FUMARATE 25 MG PO TABS
50.0000 mg | ORAL_TABLET | Freq: Two times a day (BID) | ORAL | Status: DC
  Administered 2023-06-25: 50 mg
  Filled 2023-06-25: qty 2

## 2023-06-25 MED ORDER — CHLORHEXIDINE GLUCONATE CLOTH 2 % EX PADS
6.0000 | MEDICATED_PAD | Freq: Every day | CUTANEOUS | Status: DC
  Administered 2023-06-26 – 2023-07-04 (×10): 6 via TOPICAL

## 2023-06-25 MED ORDER — QUETIAPINE FUMARATE 25 MG PO TABS
37.5000 mg | ORAL_TABLET | Freq: Once | ORAL | Status: AC
  Administered 2023-06-25: 37.5 mg
  Filled 2023-06-25: qty 2

## 2023-06-25 NOTE — Progress Notes (Signed)
 NAME:  Nathan Proctor., MRN:  [id number], DOB:  Dec 29, 1940, LOS: 20 ADMISSION DATE:  06/05/2023 CONSULTATION DATE:  06/05/2023 REFERRING MD:  Otelia Limes - Neuro, CHIEF COMPLAINT: Code Stroke, post-NIR   History of Present Illness:  83 year old man who presented to [facility] ED 2/5 via EMS for unresponsiveness and R-sided deficits. PMHx significant for HTN, HLD, prior CVA without residual deficits, T2DM, CKD stage IIIb, prostate CA, gout.  Patient presented to [facility] ED with significant R-sided deficits, aphasia and poor responsiveness. LKW 2200. Code Stroke initiated. On ED arrival, patient was afebrile with HR 86, BP 154/67, RR 20, SpO2 100%. Noted aphasia, R-sided hemiplegia, R-sided facial droop. Labs were notable for WBC 6.8. Hgb 11.9, Plt 181. INR 1.0. Na 142, K 3.8, CO2 20, Cr 1.59 (baseline), LFTs WNL. Ethanol < 10. COVID negative. CT Head with acute L MCA territory infarct affecting L insula/L frontal operculum, small chronic cortically-based infarcts of L frontal/L parietal/L occipital lobes. CTA Head/Neck with abrupt occlusion of proximal M2 L MCA vessel, ?2mm periophthalmic L ICA aneurysm, bilateral ICA plaque with progressive atherosclerotic plaque L carotid bifurcation with proximal L ICA severe, near-occlusive stenosis.   Taken to [facility] emergently for mechanical thrombectomy ([name]). TICI 3 revascularization achieved. L ICA stenosis and plaque rupture noted at the bulb, treated with stenting/angioplasty. Extubated post-procedure and maintained on cangrelor gtt with plan for transition to DAPT.  PCCM consulted for post-procedure medical management.  Pertinent Medical History:   Past Medical History:  Diagnosis Date   Anemia of chronic disease    Ankle fracture 02/15/2016   Cancer (HCC)    Prostate   Chronic constipation    Chronic kidney disease    stage III   Diabetes mellitus without complication (HCC)    type II    Diabetic peripheral neuropathy (HCC)    Failure to  thrive (0-17)    Fracture of left lower leg    Gout    Hyperlipidemia    Hypertension    Morbid obesity (HCC)    Unstable gait    Significant Hospital Events: Including procedures, antibiotic start and stop dates in addition to other pertinent events   2/5 - Presented to [facility] ED with poor responsiveness, aphasia, R-sided deficits. CT Head with L MCA territory infarct. CTA Head/Neck with occlusion of L MCA M2 segment and severe near-occlusive stenosis of L ICA. Taken to [facility] for MT, stent and angioplasty ([name]). PCCM consulted for post-procedure management. 2/6 transfer out of ICU 2/15 aspiration event early a.m. resulting in respiratory distress, transferred back to ICU and emergently intubated with development of severe shock requiring epi, levo, vaso. Transfused PRBC x1 for possible GI bleed 2/16: 3 pressors, weaning. Coox without cardiogenic component. Antibiotics vanc/cefepime. CT CAP with SBO  2/18: weaned off to only levo now which is likely sedation related  2/20: UOP drop overnight with ongoing poor renal function. Weaning sedation to evaluate neuro status.  2/22: started on trial of CRRT 2/23: HD catheter exchanged for longer one - flow improved. More awake by afternoon 2/24 tried to wean Precedex and pt got quite uncomfortable/agitated. Required Fentanyl infusion addition. Got hypotensive later in day, increase in NE  2/25 NE back down and almost off. Started pulling 50cc/hr from CRRT  Interim History / Subjective:  Did not tolerate SBT yesterday, had fentanyl added on for sedation afterwards due to discomfort Pressor requirements up as well overnight was on 20 NE at one point, back down this AM and almost  off after Albumin received Had long 20-30 second run of NSVT, spontaneously stopped. Electrolytes ok  Objective:  Blood pressure 115/73, pulse 88, temperature 100.2 F (37.9 C), temperature source Axillary, resp. rate (!) 25, height 5\' 10"  (1.778 m), weight 134.8  kg, SpO2 92%.    Vent Mode: PRVC FiO2 (%):  [50 %] 50 % Set Rate:  [22 bmp] 22 bmp Vt Set:  [580 mL] 580 mL PEEP:  [8 cmH20] 8 cmH20 Plateau Pressure:  [18 cmH20-27 cmH20] 27 cmH20   Intake/Output Summary (Last 24 hours) at 06/25/2023 0857 Last data filed at 06/25/2023 0800 Gross per 24 hour  Intake 3223.06 ml  Output 4364.3 ml  Net -1141.24 ml   Filed Weights   06/22/23 0500 06/24/23 0500 06/25/23 0500  Weight: (!) 138.5 kg (!) 137.7 kg 134.8 kg   Physical Exam: General: obese elderly man.  HENT: ETT and SBFT in place.  Pulmonary: Slightly increased WOB, Clear bilaterally on vent Cardiovascular: RRR, no M/R/G GI: abdomen protuberant. Active bowel sounds. FMS in place.  Extremities: no pitting edema  Neuro: Awake and orients to voice. Follows commands.  GU: Foley removed.   Ancillary Tests Personally Reviewed:  Hyperglycemic on tube feeds Creatinine 2.80, BUN 54 on CRRT  Assessment & Plan:Resolved Hospital Problem List  Acute hypoxemic respiratory failure due to aspiration Klebsiella pneumonia  - treated.  Ileus and swallowing dysfunction from stroke probably contributed.  Left MCA M2 occlusion and left ICA near occlusion now status post thrombectomy with complete revascularization Likely secondary to large vessel obstruction Heart failure with reduced ejection fraction ejection fraction of 30% with global hypokinesis, normal right ventricular function Hypertension Hyperlipidemia Type 2 diabetes Acute kidney injury superimposed on chronic kidney disease stage IIIb NSVT AM 2/25  Plan:  - Back on full support today as he appears a bit uncomfortable and had NSVT earlier - Minimize sedation as able, currently on 0.9 Precedex and 200 Fentanyl - Increase Seroquel - Continue daily SBT as able, holding for today - Wean NE to off - Continue CRRT and pull volume as able - Continue SSI - Follow BMP, ensure K > 4, Mg > 2 - Continue ASA, Plavix  Best Practice (right click  and "Reselect all SmartList Selections" daily)   Diet/type: tubefeeds and NPO DVT prophylaxis prophylactic heparin , SCD's Pressure ulcer(s): N/A GI prophylaxis: PPI Lines: Central line and yes and it is still needed Foley:  Yes, and it is still needed Code Status:  full code Last date of multidisciplinary goals of care discussion: Continue to update patient and family daily. They are wanting full care at this time.    CC time: 35 min.   Rutherford Guys, PA - [facility] For pager details, please see AMION or use Epic chat  After 1900, please call ELINK for Nathan coverage needs 06/25/2023, 9:06 AM

## 2023-06-25 NOTE — Progress Notes (Signed)
 eLink Physician-Brief Progress Note Patient Name: Nathan Proctor. DOB: [birth year] MRN: [id number]   Date of Service  06/25/2023  HPI/Events of Note  Notified by RN that the patient is on levophed at and unable to pull anything right now on CRRT.  Pt responded well to albumin given early in the morning.   eICU Interventions  Ok to give albumin.      Intervention Category Intermediate Interventions: Hypotension - evaluation and management  Larinda Buttery 06/25/2023, 11:00 PM

## 2023-06-25 NOTE — Progress Notes (Signed)
 [facility] Progress Note    Assessment/ Plan:   AKI on CKD 3A: Baseline creatinine 1.8.  Likely ATN in the setting of low EF and shock requiring pressors.   -CRRT start 2/22-2/23 however poor flows/catheter issues. R/s 2/23 with new catheter (appreciate CCM's assistance) -overall poor dialysis candidate given currently clinical situation, plan was to trial 2-3 days of RRT to see if there is any improvement in mental status/able to liberate from vent. Continue with CRRT, can continue to target UF goals of net neg 100cc/hr which ICU RN -Continue to monitor daily Cr, Dose meds for GFR -Monitor Daily I/Os, Daily weight  -Maintain MAP>65 for optimal renal perfusion.  -Avoid nephrotoxic medications including NSAIDs -Use synthetic opioids (Fentanyl/Dilaudid) if needed   Acute hypoxic respiratory failure: Aspiration.  Ventilatory management per primary team   Klebsiella pneumonia: Antibiotics per primary team   CVA: Status post thrombectomy.  Mental status continues to be poor.  Continue to monitor   Heart failure with reduced ejection fraction: Manage volume status as above, increasing uf goals   Uncontrolled type 2 diabetes with hyperglycemia: Management per primary team   Anemia: Multifactorial.  Transfusions per primary team  Shock: pressor support per CCM  Discussed with ICU RN.  Subjective:   Patient seen and examined on CRRT. Had to run net even overnight due to low BP but now increasing UFG. Per RN, some improvement in mental status Net neg ~1.1L   Objective:   BP (!) 90/50 Comment: up on levo to 3  Pulse 78   Temp 100.2 F (37.9 C) (Axillary)   Resp (!) 26   Ht 5\' 10"  (1.778 m)   Wt 134.8 kg   SpO2 93%   BMI 42.64 kg/m   Intake/Output Summary (Last 24 hours) at 06/25/2023 1024 Last data filed at 06/25/2023 1000 Gross per 24 hour  Intake 3396.3 ml  Output 4463.7 ml  Net -1067.4 ml   Weight change: -2.9 kg  Physical Exam: Gen: ill appearing, on  vent/sedated CVS: RRR Resp: CTA B/L, intubated Abd: soft, nt/nd Ext: trace edema LE Neuro: not following commands, unresp Dialysis access: right internal jugular temp HD catheter  Imaging: DG CHEST PORT 1 VIEW Result Date: 06/25/2023 CLINICAL DATA:  ETT placement EXAM: PORTABLE CHEST 1 VIEW COMPARISON:  06/24/2023 FINDINGS: Endotracheal tube terminates 7 cm above the carina. Patchy bilateral opacities, favoring bibasilar atelectasis. Small left pleural effusion. No pneumothorax. The heart is normal in size. Right IJ venous catheter terminates at the cavoatrial junction. Left IJ venous catheter terminates in the upper SVC. Enteric tube courses into the stomach. IMPRESSION: Endotracheal tube terminates 7 cm above the carina. Patchy bilateral opacities, favoring bibasilar atelectasis. Small left pleural effusion. Electronically Signed   By: Charline Bills M.D.   On: 06/25/2023 02:23   DG Chest Port 1 View Result Date: 06/24/2023 CLINICAL DATA:  Intubated EXAM: PORTABLE CHEST 1 VIEW COMPARISON:  06/24/2023 FINDINGS: Single frontal view of the chest image straits endotracheal tube overlying tracheal air column, tip at the level of the thoracic inlet approximately 8.5 cm above carina. Enteric catheter passes below diaphragm tip excluded by collimation. Bilateral internal jugular catheters are unchanged, distal margins projecting over the superior vena cava. Cardiac silhouette is stable. Stable bibasilar consolidation and bilateral pleural effusions. No pneumothorax. No acute bony abnormalities. IMPRESSION: 1. Endotracheal tube tip at the thoracic inlet, approximately 8.5 cm above carina. 2. Other support devices as above. 3. Persistent bibasilar consolidation and effusions. Electronically Signed   By:  Sharlet Salina M.D.   On: 06/24/2023 22:43    Labs: BMET Recent Labs  Lab 06/21/23 0602 06/22/23 9147 06/22/23 1608 06/23/23 0509 06/23/23 1623 06/24/23 0520 06/24/23 1553 06/24/23 2353  06/25/23 0541  NA 148* 144 143 137 137 136 134* 137 135  K 3.9 3.8 3.9 3.9 4.1 4.2 4.9 4.7 5.1  CL 114* 111 111 104 104 102 103  --  107  CO2 20* 20* 22 21* 23 24 23   --  20*  GLUCOSE 227* 315* 289* 250* 350* 252* 355*  --  199*  BUN 131* 154* 133* 91* 79* 54* 50*  --  42*  CREATININE 5.66* 6.92* 5.83* 4.23* 3.68* 2.80* 2.47*  --  2.35*  CALCIUM 9.0 8.8* 8.4* 8.3* 8.1* 8.7* 8.4*  --  7.9*  PHOS 5.4*  --  4.3 3.1 2.8 3.0 3.2  --  3.4   CBC Recent Labs  Lab 06/19/23 1544 06/20/23 0447 06/21/23 0602 06/24/23 2353 06/25/23 0541  WBC 4.9 5.7 7.3  --  14.3*  NEUTROABS  --   --   --   --  11.4*  HGB 8.3* 8.6* 7.6* 8.2* 8.0*  HCT 27.1* 28.0* 25.3* 24.0* 26.3*  MCV 100.0 100.7* 103.3*  --  100.8*  PLT 115* 113* 110*  --  132*    Medications:     sodium chloride   Intravenous Once   aspirin  81 mg Per Tube Daily   atorvastatin  80 mg Per Tube Daily   bisacodyl  10 mg Rectal Daily   Chlorhexidine Gluconate Cloth  6 each Topical Daily   clopidogrel  75 mg Per Tube Daily   ezetimibe  10 mg Per Tube Daily   feeding supplement (PROSource TF20)  60 mL Per Tube BID   heparin injection (subcutaneous)  5,000 Units Subcutaneous Q8H   insulin aspart  0-20 Units Subcutaneous Q4H   insulin aspart  3 Units Subcutaneous Q4H   insulin glargine  20 Units Subcutaneous BID   leptospermum manuka honey  1 Application Topical Daily   liver oil-zinc oxide   Topical BID   multivitamin  1 tablet Per Tube QHS   mouth rinse  15 mL Mouth Rinse Q2H   pantoprazole (PROTONIX) IV  40 mg Intravenous Q12H   polyethylene glycol  17 g Per Tube BID   QUEtiapine  50 mg Per Tube BID   senna-docusate  1 tablet Per Tube BID   sodium chloride flush  3 mL Intravenous Once      Anthony Sar, MD [facility] 06/25/2023, 10:24 AM

## 2023-06-25 NOTE — Progress Notes (Signed)
 STROKE TEAM PROGRESS NOTE   SUBJECTIVE (INTERVAL HISTORY)  Daughter in the room.  Updated her on his condition.  They are still talking amongst himself's on goals of care and want to give him more time to see if he improves.  Back on sedation for agitation overnight. Tachypenic overnight with airleak. On levophed.   Had a run of NSVT spontaneously resolved.  OBJECTIVE Temp:  [97.9 F (36.6 C)-99.4 F (37.4 C)] 99.4 F (37.4 C) (02/25 0400) Pulse Rate:  [61-147] 92 (02/25 0702) Cardiac Rhythm: Normal sinus rhythm (02/25 0400) Resp:  [21-40] 25 (02/25 0702) BP: (69-195)/(38-116) 115/56 (02/25 0700) SpO2:  [82 %-100 %] 91 % (02/25 0702) FiO2 (%):  [50 %] 50 % (02/25 0354) Weight:  [134.8 kg] 134.8 kg (02/25 0500)  Recent Labs  Lab 06/24/23 1129 06/24/23 1538 06/24/23 1916 06/24/23 2311 06/25/23 0321  GLUCAP 295* 337* 256* 145* 148*   Recent Labs  Lab 06/20/23 7829 06/21/23 0602 06/22/23 5621 06/23/23 0509 06/23/23 1623 06/24/23 0520 06/24/23 1553 06/24/23 2353 06/25/23 0541  NA  --  148*   < > 137 137 136 134* 137 135  K  --  3.9   < > 3.9 4.1 4.2 4.9 4.7 5.1  CL  --  114*   < > 104 104 102 103  --  107  CO2  --  20*   < > 21* 23 24 23   --  20*  GLUCOSE  --  227*   < > 250* 350* 252* 355*  --  199*  BUN  --  131*   < > 91* 79* 54* 50*  --  42*  CREATININE  --  5.66*   < > 4.23* 3.68* 2.80* 2.47*  --  2.35*  CALCIUM  --  9.0   < > 8.3* 8.1* 8.7* 8.4*  --  7.9*  MG 2.3 2.4  --  2.4  --  2.4  --   --  2.4  PHOS 4.6 5.4*   < > 3.1 2.8 3.0 3.2  --  3.4   < > = values in this interval not displayed.   Recent Labs  Lab 06/23/23 0509 06/23/23 1623 06/24/23 0520 06/24/23 1553 06/25/23 0541  ALBUMIN 1.9* 1.9* 2.0* 2.1* 2.1*    Recent Labs  Lab 06/19/23 0031 06/19/23 1544 06/20/23 0447 06/21/23 0602 06/24/23 2353 06/25/23 0541  WBC 7.4 4.9 5.7 7.3  --  14.3*  NEUTROABS  --   --   --   --   --  11.4*  HGB 7.7* 8.3* 8.6* 7.6* 8.2* 8.0*  HCT 25.2* 27.1*  28.0* 25.3* 24.0* 26.3*  MCV 100.8* 100.0 100.7* 103.3*  --  100.8*  PLT 118* 115* 113* 110*  --  132*       Component Value Date/Time   CHOL 183 06/06/2023 0557   TRIG 109 06/19/2023 0031   HDL 60 06/06/2023 0557   CHOLHDL 3.1 06/06/2023 0557   VLDL 16 06/06/2023 0557   LDLCALC 107 (H) 06/06/2023 0557   LDLCALC 223 (H) 05/31/2021 0000   Lab Results  Component Value Date   HGBA1C 6.1 (H) 06/05/2023      Component Value Date/Time   LABOPIA NONE DETECTED 06/05/2023 1653   COCAINSCRNUR NONE DETECTED 06/05/2023 1653   LABBENZ NONE DETECTED 06/05/2023 1653   AMPHETMU NONE DETECTED 06/05/2023 1653   THCU NONE DETECTED 06/05/2023 1653   LABBARB NONE DETECTED 06/05/2023 1653    No results for input(s): "ETH" in the last  168 hours.   DG CHEST PORT 1 VIEW Result Date: 06/25/2023 CLINICAL DATA:  ETT placement EXAM: PORTABLE CHEST 1 VIEW COMPARISON:  06/24/2023 FINDINGS: Endotracheal tube terminates 7 cm above the carina. Patchy bilateral opacities, favoring bibasilar atelectasis. Small left pleural effusion. No pneumothorax. The heart is normal in size. Right IJ venous catheter terminates at the cavoatrial junction. Left IJ venous catheter terminates in the upper SVC. Enteric tube courses into the stomach. IMPRESSION: Endotracheal tube terminates 7 cm above the carina. Patchy bilateral opacities, favoring bibasilar atelectasis. Small left pleural effusion. Electronically Signed   By: Charline Bills M.D.   On: 06/25/2023 02:23   DG Chest Port 1 View Result Date: 06/24/2023 CLINICAL DATA:  Intubated EXAM: PORTABLE CHEST 1 VIEW COMPARISON:  06/24/2023 FINDINGS: Single frontal view of the chest image straits endotracheal tube overlying tracheal air column, tip at the level of the thoracic inlet approximately 8.5 cm above carina. Enteric catheter passes below diaphragm tip excluded by collimation. Bilateral internal jugular catheters are unchanged, distal margins projecting over the superior  vena cava. Cardiac silhouette is stable. Stable bibasilar consolidation and bilateral pleural effusions. No pneumothorax. No acute bony abnormalities. IMPRESSION: 1. Endotracheal tube tip at the thoracic inlet, approximately 8.5 cm above carina. 2. Other support devices as above. 3. Persistent bibasilar consolidation and effusions. Electronically Signed   By: Sharlet Salina M.D.   On: 06/24/2023 22:43   DG CHEST PORT 1 VIEW Result Date: 06/23/2023 CLINICAL DATA:  Central line placement EXAM: PORTABLE CHEST 1 VIEW COMPARISON:  06/22/2023 FINDINGS: RIGHT central venous line with tip in distal SVC. LEFT central venous line with tip in the mid SVC. Endotracheal tube and feeding tube unchanged. No pneumothorax. Bilateral small effusions. Low lung volumes. IMPRESSION: 1. Bilateral central venous lines without complication. 2. Low lung volumes and small effusions. Electronically Signed   By: Genevive Bi M.D.   On: 06/23/2023 10:10   DG Chest Port 1 View Result Date: 06/22/2023 CLINICAL DATA:  Central line placement. EXAM: PORTABLE CHEST 1 VIEW COMPARISON:  June 19, 2023. FINDINGS: Stable cardiomediastinal silhouette. Hypoinflation of the lungs is noted with bibasilar subsegmental atelectasis and probable small pleural effusions. Endotracheal tube and feeding tube are unchanged. Left internal jugular catheter is unchanged. Interval placement of right internal jugular catheter with distal tip in expected position of the SVC. No pneumothorax. IMPRESSION: Interval placement of right internal jugular catheter with distal tip in expected position of the SVC. Electronically Signed   By: Lupita Raider M.D.   On: 06/22/2023 11:36   DG CHEST PORT 1 VIEW Result Date: 06/19/2023 CLINICAL DATA:  Respiratory distress. EXAM: PORTABLE CHEST 1 VIEW COMPARISON:  Radiograph 06/15/2023, CT 06/16/2023 FINDINGS: Tip of the endotracheal tube 4.5 cm from the carina. Enteric tube and left central line unchanged in positioning.  Slight improved right lung aeration with persistent basilar opacity. Slight worsening patchy opacity at the left lung base. No pneumothorax or large pleural effusion. No pulmonary edema. Stable heart size and mediastinal contours. IMPRESSION: 1. Bibasilar opacities, improving on the right but worsening on the left. 2. Stable support apparatus. Electronically Signed   By: Narda Rutherford M.D.   On: 06/19/2023 16:02   DG Abd Portable 1V Result Date: 06/18/2023 CLINICAL DATA:  Orogastric tube placement. EXAM: PORTABLE ABDOMEN - 1 VIEW COMPARISON:  Radiographs 06/05/2023.  Abdominal CT 06/16/2023. FINDINGS: 1236 hours. Tip of the enteric tube projects over the right upper quadrant of the abdomen, likely in the distal stomach. There is a  small amount contrast material within the stomach. The visualized bowel gas pattern is normal. Patient is rotated to the right with persistent right basilar airspace disease and a small right pleural effusion. IMPRESSION: Enteric tube tip projects over the distal stomach. Electronically Signed   By: Carey Bullocks M.D.   On: 06/18/2023 14:48   CT CHEST ABDOMEN PELVIS WO CONTRAST Result Date: 06/16/2023 CLINICAL DATA:  Septicemia.  Pneumonia. EXAM: CT CHEST, ABDOMEN AND PELVIS WITHOUT CONTRAST TECHNIQUE: Multidetector CT imaging of the chest, abdomen and pelvis was performed following the standard protocol without IV contrast. RADIATION DOSE REDUCTION: This exam was performed according to the departmental dose-optimization program which includes automated exposure control, adjustment of the mA and/or kV according to patient size and/or use of iterative reconstruction technique. COMPARISON:  CT angio chest from 04/04/2016. FINDINGS: CT CHEST FINDINGS Cardiovascular: Heart size is normal. Aortic atherosclerosis and multi vessel coronary artery calcifications. No pericardial effusion. Mediastinum/Nodes: Thyroid gland, trachea and esophagus appear normal. ET tube is in place with tip  above the carina. There is a enteric tube with tip in the stomach.No enlarged mediastinal lymph nodes. Hilar lymph nodes are suboptimally evaluated due to lack of IV contrast. Lungs/Pleura: No pleural effusion or interstitial edema. Bilateral lower lobe airspace consolidation is identified, right greater than left. Imaging findings compatible with multifocal pneumonia. Chronic subsegmental atelectasis this is within the right middle lobe and right lower lobe with asymmetric volume loss and elevation of the right hemidiaphragm. Musculoskeletal: No chest wall mass or suspicious bone lesions identified. CT ABDOMEN PELVIS FINDINGS Hepatobiliary: No focal liver abnormality. Gallbladder appears normal. No signs of bile duct dilatation. Pancreas: Unremarkable. No pancreatic ductal dilatation or surrounding inflammatory changes. Spleen: Normal in size without focal abnormality. Adrenals/Urinary Tract: Normal adrenal glands. Multiple stones identified within the upper pole of the left kidney which measure up to 7 mm. Bilateral exophytic kidney lesions of varying complexity are identified. These are suboptimally assessed on the current exam reflecting lack of IV contrast material. No signs of hydronephrosis. Urinary bladder is decompressed around a Foley catheter. Stomach/Bowel: Enteric tube tip is in the proximal stomach. There is retained enteric contrast material within the gastric fundus. The appendix is visualized and appears normal. There are several dilated loops of small bowel within the right hemiabdomen. The proximal small bowel loops are normal in caliber. Within the right upper quadrant of the abdomen there are multiple dilated loops of small bowel measuring up to 3.5 cm containing air-fluid levels. The bowel loops proximal and distal are normal in caliber. Within the limitations of unenhanced technique there is no significant bowel wall thickening. Distal colonic diverticulosis identified without signs of acute  diverticulitis. Retained enteric contrast material identified within the colon up to the rectum Vascular/Lymphatic: Aortic atherosclerosis without aneurysm. No signs of abdominopelvic adenopathy. Reproductive: Prostate gland is either atrophic or surgically absent. Penile prosthesis identified. Other: No free fluid or fluid collections. Musculoskeletal: No acute or significant osseous findings. IMPRESSION: 1. Bilateral lower lobe airspace consolidation, right greater than left. Imaging findings compatible with multifocal pneumonia. 2. Chronic subsegmental atelectasis within the right middle lobe and right lower lobe with asymmetric volume loss and elevation of the right hemidiaphragm. 3. Assessment of bowel pathology is limited due to lack of IV and enteric contrast material. There are multiple abnormally dilated loops of small bowel within the right hemiabdomen. Findings are compatible with a small-bowel obstruction. As there is normal caliber proximal small bowel and normal caliber distal small bowel findings are concerning  for either close loop obstruction or internal hernia. 4. Distal colonic diverticulosis without signs of acute diverticulitis. 5. Nonobstructing left renal calculi. 6. Bilateral exophytic kidney lesions of varying complexity are identified. These are suboptimally assessed on the current exam reflecting lack of IV contrast material. Further evaluation with nonemergent renal ultrasound is advised. 7.  Aortic Atherosclerosis (ICD10-I70.0). Electronically Signed   By: Signa Kell M.D.   On: 06/16/2023 15:51   ECHOCARDIOGRAM LIMITED Result Date: 06/15/2023    ECHOCARDIOGRAM LIMITED REPORT   Patient Name:   Carol Loftin. Date of Exam: 06/15/2023 Medical Rec #:  [id number]              Height:       70.0 in Accession #:    [id number]             Weight:       263.9 lb Date of Birth:  03-17-1941              BSA:          2.348 m Patient Age:    83 years               BP:           121/53  mmHg Patient Gender: M                      HR:           90 bpm. Exam Location:  Inpatient Procedure: Limited Echo, Color Doppler and Intracardiac Opacification Agent            (Both Spectral and Color Flow Doppler were utilized during            procedure). Indications:    Stroke i63.9, Shock  History:        Patient has prior history of Echocardiogram examinations, most                 recent 06/05/2023. CHF; Risk Factors:Hypertension and                 Dyslipidemia.  Sonographer:    Irving Burton Senior RDCS Referring Phys: [phone] [facility] JANE ELLISON  Sonographer Comments: Technically difficult due to body habitus, scanned supine on artificial repirator IMPRESSIONS  1. Very difficult to determine LVEF to limited visualization and frequent ectopy. . Left ventricular ejection fraction, by estimation, is 40 to 45%. The left ventricle has mildly decreased function. Left ventricular endocardial border not optimally defined to evaluate regional wall motion.  2. Right ventricular systolic function was not well visualized. The right ventricular size is not well visualized.  3. The inferior vena cava is normal in size with <50% respiratory variability, suggesting right atrial pressure of 8 mmHg.  4. Limited echo FINDINGS  Left Ventricle: Very difficult to determine LVEF to limited visualization and frequent ectopy. Left ventricular ejection fraction, by estimation, is 40 to 45%. The left ventricle has mildly decreased function. Left ventricular endocardial border not optimally defined to evaluate regional wall motion. Definity contrast agent was given IV to delineate the left ventricular endocardial borders. Right Ventricle: The right ventricular size is not well visualized. Right vetricular wall thickness was not well visualized. Right ventricular systolic function was not well visualized. Venous: The inferior vena cava is normal in size with less than 50% respiratory variability, suggesting right atrial pressure of 8 mmHg.  Additional Comments: Color Doppler performed.  Dina Rich MD Electronically signed by Dina Rich MD  Signature Date/Time: 06/15/2023/4:31:38 PM    Final    DG Abd 1 View Result Date: 06/15/2023 CLINICAL DATA:  Orogastric tube placement. EXAM: ABDOMEN - 1 VIEW COMPARISON:  Radiograph 06/08/2023 FINDINGS: Tip of the enteric tube below the diaphragm in the stomach, the side port is just beyond the gastroesophageal junction. There is gaseous gastric distension. Dilated bowel in the right abdomen up 4.7 cm, potentially small bowel, but incompletely included in the field of view. IMPRESSION: 1. Tip of the enteric tube below the diaphragm in the stomach, side-port just beyond the gastroesophageal junction. 2. Gaseous gastric distension. Additional dilated bowel in the right abdomen likely small bowel, although incompletely included in the field of view. Electronically Signed   By: Narda Rutherford M.D.   On: 06/15/2023 15:06   DG CHEST PORT 1 VIEW Result Date: 06/15/2023 CLINICAL DATA:  10031 Cough 10031 EXAM: PORTABLE CHEST 1 VIEW COMPARISON:  June 08, 2023 FINDINGS: The cardiomediastinal silhouette is unchanged in contour.Unchanged elevation of the RIGHT hemidiaphragm. No pleural effusion. No pneumothorax. Bibasilar platelike opacities. Atherosclerotic calcifications. Similar background of interstitial prominence. IMPRESSION: Bibasilar platelike opacities, favored to reflect atelectasis. Electronically Signed   By: Meda Klinefelter M.D.   On: 06/15/2023 12:07   DG CHEST PORT 1 VIEW Result Date: 06/15/2023 CLINICAL DATA:  Respiratory failure, intubation and central line placement. EXAM: PORTABLE CHEST 1 VIEW COMPARISON:  Film earlier today at 0734 hours FINDINGS: Interval intubation with the endotracheal tube tip approximately 3.5 cm above the carina. Left jugular central line placement with the line tip in the upper SVC. No pneumothorax. Volume loss of the right lung remains with elevation of the  right hemidiaphragm. Improved expansion of the left lung. Potential underlying mild pulmonary interstitial edema. No significant pleural effusions. IMPRESSION: 1. Interval intubation and left jugular central line placement. No pneumothorax. 2. Improved expansion of the left lung. Persistent volume loss of the right lung with elevation of the right hemidiaphragm. Potential underlying mild pulmonary interstitial edema. Electronically Signed   By: Irish Lack M.D.   On: 06/15/2023 10:42   DG Swallowing Func-Speech Pathology Result Date: 06/12/2023 Table formatting from the original result was not included. Modified Barium Swallow Study Patient Details Name: Nathanael Krist. MRN: [id number] Date of Birth: 02-15-1941 Today's Date: 06/12/2023 HPI/PMH: HPI: Pt is a 83 y.o. male who was admitted as a code stroke on 02/05 due to acute onset of right-sided weakness, right facial droop and aphasia. MRI from 02/06 displayed an evolving acute left MCA infarct, most pronounced at the left insula and left frontal operculum. Pt has intentionally lost 300 pounds in the recent past, with pre-loss weight at ~600Ib. PMHx includes hypertension, diabetes, CKD, prior CVA. Clinical Impression: Clinical Impression: Pt presents with a mild-moderate oropharyngeal dysphagia (DIGEST Score: 1) primarily characterized by silent penetration of thin liquid at level of VFs, flash penetration of nectar-thick above VFs, and diffuse pharyngeal residue. Pt's dysphagia severity level dropped from DIGEST score of 3 to 1, compared to previous MBS eval on 02/07.     Pharyngeal residue was present throughout all consistencies. Pt's posture for swallowing was consistently neutral. Pt follows commands for compensatory strategies more readily now with max multimodal cueing. Effective compensatory strategies include multiple swallows, throat clearing, and a straw when used with a verbally cued slow rate. Multiple consecutive sips of thin-liquid  presented the most risk for aspiration due to silent penetration of Vfs. Unlike previous MBS study, pt tolerated solid consistency, displaying prolonged mastication but majority clearance  of bolus after 2-3 swallows.     Recommendations include upgrading pt's diet to dysphagia 2 (finely chopped) and thin liquids to optimize safety and efficiency for swallowing. SLP f/u to monitor tolerance of thin-liquids with compensatory strategies is highly recommended. Pt needs to remain upright for 30 minutes after PO intake due to remaining pharyngeal residue to optimize airway safety. Factors that may increase risk of adverse event in presence of aspiration [name] & Clearance [name] 2021): Factors that may increase risk of adverse event in presence of aspiration [name] & Clearance [name] 2021): Dependence for feeding and/or oral hygiene Recommendations/Plan: Swallowing Evaluation Recommendations Swallowing Evaluation Recommendations Recommendations: PO diet PO Diet Recommendation: Dysphagia 2 (Finely chopped); Thin liquids (Level 0) Liquid Administration via: Straw Medication Administration: Crushed with puree Supervision: Full supervision/cueing for swallowing strategies; Full assist for feeding Swallowing strategies  : Slow rate; Small bites/sips; Multiple dry swallows after each bite/sip; Clear throat intermittently Postural changes: Position pt fully upright for meals; Stay upright 30-60 min after meals Oral care recommendations: Oral care BID (2x/day); Staff/trained caregiver to provide oral care Treatment Plan Treatment Plan Follow-up recommendations: Follow physicians's recommendations for discharge plan and follow up therapies Functional status assessment: Patient has had a recent decline in their functional status and demonstrates the ability to make significant improvements in function in a reasonable and predictable amount of time. Recommendations Recommendations for follow up therapy are one component of a multi-disciplinary  discharge planning process, led by the attending physician.  Recommendations may be updated based on patient status, additional functional criteria and insurance authorization. Assessment: Orofacial Exam: Orofacial Exam Oral Cavity: Oral Hygiene: WFL Oral Cavity - Dentition: Adequate natural dentition Orofacial Anatomy: WFL Anatomy: Anatomy: WFL Boluses Administered: Boluses Administered Boluses Administered: Thin liquids (Level 0); Mildly thick liquids (Level 2, nectar thick); Moderately thick liquids (Level 3, honey thick); Puree; Solid  Oral Impairment Domain: Oral Impairment Domain Lip Closure: Interlabial escape, no progression to anterior lip Tongue control during bolus hold: Cohesive bolus between tongue to palatal seal Bolus preparation/mastication: Slow prolonged chewing/mashing with complete recollection Bolus transport/lingual motion: Brisk tongue motion Oral residue: Residue collection on oral structures Location of oral residue : Tongue Initiation of pharyngeal swallow : Pyriform sinuses  Pharyngeal Impairment Domain: Pharyngeal Impairment Domain Soft palate elevation: No bolus between soft palate (SP)/pharyngeal wall (PW) Laryngeal elevation: Partial superior movement of thyroid cartilage/partial approximation of arytenoids to epiglottic petiole Anterior hyoid excursion: Complete anterior movement Epiglottic movement: Complete inversion Laryngeal vestibule closure: Incomplete, narrow column air/contrast in laryngeal vestibule Pharyngeal stripping wave : Present - complete Pharyngeal contraction (A/P view only): N/A Pharyngoesophageal segment opening: Complete distension and complete duration, no obstruction of flow Tongue base retraction: Trace column of contrast or air between tongue base and PPW Pharyngeal residue: Collection of residue within or on pharyngeal structures Location of pharyngeal residue: Diffuse (>3 areas)  Esophageal Impairment Domain: Esophageal Impairment Domain Esophageal clearance  upright position: -- (Not tested) Pill: No data recorded Penetration/Aspiration Scale Score: Penetration/Aspiration Scale Score 1.  Material does not enter airway: Solid; Puree; Moderately thick liquids (Level 3, honey thick) 2.  Material enters airway, remains ABOVE vocal cords then ejected out: Mildly thick liquids (Level 2, nectar thick) 5.  Material enters airway, CONTACTS cords and not ejected out: Thin liquids (Level 0) Compensatory Strategies: Compensatory Strategies Compensatory strategies: Yes Straw: Effective Effective Straw: Mildly thick liquid (Level 2, nectar thick) (Effective with slow rate applied.) Multiple swallows: Effective Effective Multiple Swallows: Thin liquid (Level 0); Mildly thick liquid (Level 2, nectar  thick); Moderately thick liquid (Level 3, honey thick); Puree; Solid   General Information: Caregiver present: No  Diet Prior to this Study: Dysphagia 1 (pureed); Moderately thick liquids (Level 3, honey thick)   Temperature : Normal   Respiratory Status: WFL   Supplemental O2: None (Room air)   History of Recent Intubation: Yes  Behavior/Cognition: Alert; Cooperative; Pleasant mood; Requires cueing Self-Feeding Abilities: Needs assist with self-feeding Baseline vocal quality/speech: Normal Volitional Cough: Unable to elicit Volitional Swallow: Able to elicit Exam Limitations: No limitations Goal Planning: Prognosis for improved oropharyngeal function: Good Barriers to Reach Goals: Language deficits No data recorded No data recorded No data recorded Pain: Pain Assessment Pain Assessment: No/denies pain Pain Score: 0 End of Session: Start Time:SLP Start Time (ACUTE ONLY): 1310 Stop Time: SLP Stop Time (ACUTE ONLY): 1335 Time Calculation:SLP Time Calculation (min) (ACUTE ONLY): 25 min Charges: SLP Evaluations $ SLP Speech Visit: 1 Visit SLP Evaluations $MBS Swallow: 1 Procedure $Swallowing Treatment: 1 Procedure SLP visit diagnosis: SLP Visit Diagnosis: Dysphagia, oropharyngeal phase (R13.12)  Past Medical History: Past Medical History: Diagnosis Date  Anemia of chronic disease   Ankle fracture 02/15/2016  Cancer (HCC)   Prostate  Chronic constipation   Chronic kidney disease   stage III  Diabetes mellitus without complication (HCC)   type II   Diabetic peripheral neuropathy (HCC)   Failure to thrive (0-17)   Fracture of left lower leg   Gout   Hyperlipidemia   Hypertension   Morbid obesity (HCC)   Unstable gait  Past Surgical History: Past Surgical History: Procedure Laterality Date  IR CT HEAD LTD  06/05/2023  IR INTRAVSC STENT CERV CAROTID W/O EMB-PROT MOD SED INC ANGIO  06/05/2023  IR PERCUTANEOUS ART THROMBECTOMY/INFUSION INTRACRANIAL INC DIAG ANGIO  06/05/2023  IR US GUIDE VASC ACCESS RIGHT  06/05/2023  ORIF ANKLE FRACTURE Left 02/29/2016  Procedure: OPEN REDUCTION INTERNAL FIXATION (ORIF) ANKLE FRACTURE;  Surgeon: Yolonda Kida, MD;  Location: [facility];  Service: Orthopedics;  Laterality: Left;  PROSTATE SURGERY    RADIOLOGY WITH ANESTHESIA N/A 06/05/2023  Procedure: IR WITH ANESTHESIA;  Surgeon: Radiologist, Medication, MD;  Location: [facility];  Service: Radiology;  Laterality: N/A; Claudine Mouton 06/12/2023, 3:16 PM  DG Abd 1 View Result Date: 06/09/2023 CLINICAL DATA:  [id number]. Encounter for feeding tube placement. EXAM: ABDOMEN - 1 VIEW COMPARISON:  Abdomen film 06/05/2023. FINDINGS: Dobbhoff feeding tube is well placed with the radiopaque tip in the distal stomach. The visualized bowel pattern is nonobstructive. There is barium newly seen in the flexures and transverse colon. There is no supine evidence of free air. There is atelectasis in the lung bases. Cardiomegaly. IMPRESSION: 1. Dobbhoff feeding tube well placed with the radiopaque tip in the distal stomach. 2. Nonobstructive bowel gas pattern. 3. Cardiomegaly. Electronically Signed   By: Almira Bar M.D.   On: 06/09/2023 03:41   DG CHEST PORT 1 VIEW Result Date: 06/08/2023 CLINICAL DATA:  CHF EXAM: PORTABLE CHEST 1 VIEW COMPARISON:   01/06/2023 FINDINGS: Feeding tube in place. There is cardiomegaly with vascular congestion. Chronic elevation of the right hemidiaphragm with right base atelectasis. Interstitial prominence throughout the lungs, right greater than left could reflect interstitial edema. Possible small effusions. No acute bony abnormality. IMPRESSION: Cardiomegaly with vascular congestion and probable mild interstitial edema. Question small bilateral effusions. Chronic elevation of the right hemidiaphragm with right base atelectasis. Electronically Signed   By: Charlett Nose M.D.   On: 06/08/2023 18:20   DG Swallowing Func-Speech Pathology  Result Date: 06/07/2023 Table formatting from the original result was not included. Modified Barium Swallow Study Study completed and documented by [name], SLP Student Supervised and reviewed by Harlon Ditty, MA CCC-SLP [facility] Secure Chat Preferred Office [phone] Patient Details Name: Bowie Delia. MRN: [id number] Date of Birth: 22-Jun-1940 Today's Date: 06/07/2023 HPI/PMH: HPI: Pt is a 83 y.o. male who was admitted as a code stroke on 02/05 due to acute onset of right-sided weakness, right facial droop and aphasia. MRI from 02/06 displayed an evolving acute left MCA infarct, most pronounced at the left insula and left frontal operculum. Pt has intentionally lost 300 pounds in the recent past, with pre-loss weight at ~600Ib. PMHx includes hypertension, diabetes, CKD, prior CVA. Clinical Impression: Clinical Impression: Pt presented with a moderate-severe oropharyngeal dysphagia (DIGEST Score: 3) primarily characterized by silent aspiration of thin liquid, silent penetration of nectar-thick liquid, diffuse pharyngeal residue collection during thin/nectar-thick consistencies, and posterior-escape of bolus across multiple consistencies. Pt positioning was a potential limitation for examination due to consistent posterior head position. More anterior head  positioning during nectar-thick liquid displayed less pharyngeal residue, increased hyo-laryngeal excursion, and overall better airway safety. This positioning was not present throughout due to pt's receptive language deficits to understand cues. Alternating between honey-thick and puree consistencies provided more oropharyngeal bolus clearance and no signs of aspiration. Recommend a dysphagia 1 (puree) and honey-thick liquid diet to optimize pt's airway safety and potential for meeting nutritional needs. Slow rate, small bites/sips, natural head posturing, and multiple swallows are compensatory strategies that displayed best efficiency with pt. F/u with SLP for family education, compensatory strategy training, and upgraded diet trials is recommended. Factors that may increase risk of adverse event in presence of aspiration [name] & Clearance [name] 2021): Factors that may increase risk of adverse event in presence of aspiration [name] & Clearance [name] 2021): Weak cough Recommendations/Plan: Swallowing Evaluation Recommendations Swallowing Evaluation Recommendations Recommendations: PO diet PO Diet Recommendation: Dysphagia 1 (Pureed); Moderately thick liquids (Level 3, honey thick) Liquid Administration via: Spoon Medication Administration: Crushed with puree Supervision: Full supervision/cueing for swallowing strategies; Full assist for feeding Swallowing strategies  : Slow rate; Small bites/sips; Multiple dry swallows after each bite/sip (Pt needs to be in a neutral head position (no posterior head tilt).) Postural changes: Position pt fully upright for meals; Stay upright 30-60 min after meals Oral care recommendations: Oral care BID (2x/day); Staff/trained caregiver to provide oral care Caregiver Recommendations: Have oral suction available Treatment Plan Treatment Plan Treatment recommendations: Therapy as outlined in treatment plan below Follow-up recommendations: Follow physicians's recommendations for discharge plan and  follow up therapies Functional status assessment: Patient has had a recent decline in their functional status and demonstrates the ability to make significant improvements in function in a reasonable and predictable amount of time. Treatment frequency: Min 2x/week Treatment duration: 2 weeks Interventions: Aspiration precaution training; Compensatory techniques; Patient/family education; Diet toleration management by SLP; Trials of upgraded texture/liquids Recommendations Recommendations for follow up therapy are one component of a multi-disciplinary discharge planning process, led by the attending physician.  Recommendations may be updated based on patient status, additional functional criteria and insurance authorization. Assessment: Orofacial Exam: Orofacial Exam Oral Cavity - Dentition: Adequate natural dentition Orofacial Anatomy: WFL Anatomy: Anatomy: WFL Boluses Administered: Boluses Administered Boluses Administered: Thin liquids (Level 0); Mildly thick liquids (Level 2, nectar thick); Moderately thick liquids (Level 3, honey thick); Puree; Solid  Oral Impairment Domain: Oral Impairment Domain Lip Closure: Escape progressing to mid-chin Tongue control  during bolus hold: Posterior escape of greater than half of bolus Bolus preparation/mastication: -- (Pt did not chew solid and SLP had to remove it from mouth.) Bolus transport/lingual motion: Repetitive/disorganized tongue motion Oral residue: Residue collection on oral structures Location of oral residue : Tongue; Palate Initiation of pharyngeal swallow : Pyriform sinuses  Pharyngeal Impairment Domain: Pharyngeal Impairment Domain Soft palate elevation: No bolus between soft palate (SP)/pharyngeal wall (PW) Laryngeal elevation: Partial superior movement of thyroid cartilage/partial approximation of arytenoids to epiglottic petiole Anterior hyoid excursion: Partial anterior movement Epiglottic movement: Partial inversion Laryngeal vestibule closure:  Incomplete, narrow column air/contrast in laryngeal vestibule Pharyngeal stripping wave : Present - complete Pharyngeal contraction (A/P view only): N/A Pharyngoesophageal segment opening: Complete distension and complete duration, no obstruction of flow Tongue base retraction: Narrow column of contrast or air between tongue base and PPW Pharyngeal residue: Collection of residue within or on pharyngeal structures Location of pharyngeal residue: Diffuse (>3 areas)  Esophageal Impairment Domain: Esophageal Impairment Domain Esophageal clearance upright position: -- (Not tested.) Pill: No data recorded Penetration/Aspiration Scale Score: Penetration/Aspiration Scale Score 1.  Material does not enter airway: Puree; Moderately thick liquids (Level 3, honey thick) 3.  Material enters airway, remains ABOVE vocal cords and not ejected out: Mildly thick liquids (Level 2, nectar thick) 8.  Material enters airway, passes BELOW cords without attempt by patient to eject out (silent aspiration) : Thin liquids (Level 0) Compensatory Strategies: Compensatory Strategies Compensatory strategies: Yes Straw: Ineffective Multiple swallows: Effective (Required max cueing due to pt's language.) Effective Multiple Swallows: Puree; Moderately thick liquid (Level 3, honey thick); Mildly thick liquid (Level 2, nectar thick)   General Information: Caregiver present: No  Diet Prior to this Study: NPO   Temperature : Normal   Respiratory Status: WFL   Supplemental O2: None (Room air)   No data recorded Behavior/Cognition: Alert; Cooperative; Pleasant mood; Requires cueing Self-Feeding Abilities: Needs assist with self-feeding Baseline vocal quality/speech: Dysphonic Volitional Cough: Unable to elicit Volitional Swallow: Able to elicit Exam Limitations: Poor positioning Goal Planning: Prognosis for improved oropharyngeal function: Good Barriers to Reach Goals: Language deficits No data recorded No data recorded Consulted and agree with results  and recommendations: Pt unable/family or caregiver not available Pain: Pain Assessment Pain Assessment: No/denies pain Faces Pain Scale: 0 Facial Expression: 0 Body Movements: 0 Muscle Tension: 0 Compliance with ventilator (intubated pts.): N/A Vocalization (extubated pts.): 0 CPOT Total: 0 End of Session: Start Time:SLP Start Time (ACUTE ONLY): 0903 Stop Time: SLP Stop Time (ACUTE ONLY): 0930 Time Calculation:SLP Time Calculation (min) (ACUTE ONLY): 27 min Charges: SLP Evaluations $ SLP Speech Visit: 1 Visit SLP Evaluations $BSS Swallow: 1 Procedure $MBS Swallow: 1 Procedure $ SLP EVAL LANGUAGE/SOUND PRODUCTION: 1 Procedure $Swallowing Treatment: 1 Procedure SLP visit diagnosis: SLP Visit Diagnosis: Dysphagia, oropharyngeal phase (R13.12) Past Medical History: Past Medical History: Diagnosis Date  Anemia of chronic disease   Ankle fracture 02/15/2016  Cancer (HCC)   Prostate  Chronic constipation   Chronic kidney disease   stage III  Diabetes mellitus without complication (HCC)   type II   Diabetic peripheral neuropathy (HCC)   Failure to thrive (0-17)   Fracture of left lower leg   Gout   Hyperlipidemia   Hypertension   Morbid obesity (HCC)   Unstable gait  Past Surgical History: Past Surgical History: Procedure Laterality Date  IR CT HEAD LTD  06/05/2023  IR INTRAVSC STENT CERV CAROTID W/O EMB-PROT MOD SED INC ANGIO  06/05/2023  IR PERCUTANEOUS ART  THROMBECTOMY/INFUSION INTRACRANIAL INC DIAG ANGIO  06/05/2023  IR US GUIDE VASC ACCESS RIGHT  06/05/2023  ORIF ANKLE FRACTURE Left 02/29/2016  Procedure: OPEN REDUCTION INTERNAL FIXATION (ORIF) ANKLE FRACTURE;  Surgeon: Yolonda Kida, MD;  Location: [facility];  Service: Orthopedics;  Laterality: Left;  PROSTATE SURGERY    RADIOLOGY WITH ANESTHESIA N/A 06/05/2023  Procedure: IR WITH ANESTHESIA;  Surgeon: Radiologist, Medication, MD;  Location: [facility];  Service: Radiology;  Laterality: N/A; [place], Riley Nearing 06/07/2023, 12:15 PM  CT HEAD WO CONTRAST ( ) Result Date:  06/07/2023 CLINICAL DATA:  83 year old male status post code stroke presentation, left MCA M2 occlusion, with left MCA infarct with confluent petechial hemorrhage. EXAM: CT HEAD WITHOUT CONTRAST TECHNIQUE: Contiguous axial images were obtained from the base of the skull through the vertex without intravenous contrast. RADIATION DOSE REDUCTION: This exam was performed according to the departmental dose-optimization program which includes automated exposure control, adjustment of the mA and/or kV according to patient size and/or use of iterative reconstruction technique. COMPARISON:  Brain MRI yesterday.  Head CT 06/05/2023. FINDINGS: Brain: Confluent mixed density infarct in the left MCA middle division, epicenter at the insula and operculum. Confluent petechial hemorrhage on series 3, image 16 appears stable from the MRI. Superimposed trace subarachnoid blood is difficult to exclude by CT (series 3, image 13), but was not apparent on MRI. Stable mild mass effect on the left lateral ventricle with only trace rightward midline shift (series 3, image 16). No ventriculomegaly. Elsewhere gray-white differentiation is stable from the presentation CT. Basilar cisterns remain patent. Vascular: Resolved hyperdense left MCA since presentation. Calcified atherosclerosis at the skull base. Skull: Stable, intact. Sinuses/Orbits: Visualized paranasal sinuses and mastoids are stable and well aerated. Other: Left nasoenteric tube now in place. Stable orbit and scalp soft tissues. IMPRESSION: 1. Stable by CT confluent Left MCA middle division infarct with petechial hemorrhage (Heidelberg classification 1b: HI2, confluent petechiae, no mass effect). Questionable trace superimposed SAH, but was not apparent on MRI. 2. Stable minimal intracranial mass effect. 3. No new intracranial abnormality. Electronically Signed   By: Odessa Fleming M.D.   On: 06/07/2023 05:37   MR BRAIN WO CONTRAST Result Date: 06/06/2023 CLINICAL DATA:  Follow-up  examination for stroke. EXAM: MRI HEAD WITHOUT CONTRAST TECHNIQUE: Multiplanar, multiecho pulse sequences of the brain and surrounding structures were obtained without intravenous contrast. COMPARISON:  Comparison made to multiple previous exams from 06/05/2023. FINDINGS: Brain: Examination degraded by motion artifact. Cerebral volume within normal limits for age. Patchy T2/FLAIR hyperintensity involving the periventricular deep white matter both cerebral hemispheres, consistent with chronic small vessel ischemic disease, mild in nature. Small remote infarct noted within the right cerebellum. Confluent restricted diffusion involving the left insula and left frontal operculum, consistent with evolving acute left MCA distribution infarct. Area of infarction measures up to approximately 6 cm in AP diameter. Prominent associated susceptibility artifact, consistent with petechial hemorrhage (series 7, image 61) ( Heidelberg classification 1b: HI2, confluent petechiae, no mass effect. No frank or organized hematoma evident by MRI. No significant regional mass effect. Few additional small foci of infarction noted within the left parieto-occipital region posteriorly (series 3, images 40, 29). Apparent diffusion signal along the right frontal parafalcine region felt to be consistent with artifact related to dural calcification. Note made of a few additional chronic micro hemorrhages within the left cerebellum and right thalamus. No mass lesion or midline shift. Ventricles normal size without hydrocephalus. No extra-axial fluid collection. Pituitary gland suprasellar region within normal limits. Vascular: Major  intracranial vascular flow voids are maintained. Skull and upper cervical spine: Craniocervical junction within normal limits. Bone marrow signal intensity overall within normal limits. No scalp soft tissue abnormality. Sinuses/Orbits: Prior bilateral ocular lens replacement. Paranasal sinuses are clear. No mastoid  effusion. Other: None. IMPRESSION: 1. Evolving acute left MCA distribution infarct, most pronounced at the left insula and left frontal operculum. Prominent associated petechial hemorrhage without frank intraparenchymal hematoma (Heidelberg classification 1b: HI2, confluent petechiae, no mass effect). 2. Few additional small foci of acute infarction within the left parieto-occipital region posteriorly. 3. Underlying mild chronic microvascular ischemic disease with small remote right cerebellar infarct. Electronically Signed   By: Rise Mu M.D.   On: 06/06/2023 03:12   ECHOCARDIOGRAM COMPLETE Result Date: 06/05/2023    ECHOCARDIOGRAM REPORT   Patient Name:   Ebrima Ranta. Date of Exam: 06/05/2023 Medical Rec #:  [id number]              Height:       70.0 in Accession #:    [id number]             Weight:       292.3 lb Date of Birth:  07-05-1940              BSA:          2.453 m Patient Age:    82 years               BP:           129/72 mmHg Patient Gender: M                      HR:           89 bpm. Exam Location:  Inpatient Procedure: 2D Echo, Color Doppler, Cardiac Doppler and Intracardiac            Opacification Agent Indications:    Stroke I63.9  History:        Patient has prior history of Echocardiogram examinations, most                 recent 10/06/2019. Risk Factors:Diabetes, Hypertension and                 Dyslipidemia.  Sonographer:    Harriette Bouillon RDCS Referring Phys: Lynnae January IMPRESSIONS  1. Left ventricular ejection fraction, by estimation, is 30%. The left ventricle has moderately decreased function. The left ventricle demonstrates global hypokinesis. There is mild left ventricular hypertrophy.  2. Right ventricular systolic function is normal. The right ventricular size is normal.  3. Trivial mitral valve regurgitation.  4. The aortic valve is tricuspid. Aortic valve regurgitation is not visualized.  5. The inferior vena cava is normal in size with greater than 50%  respiratory variability, suggesting right atrial pressure of 3 mmHg. Comparison(s): The left ventricular function is worsened. FINDINGS  Left Ventricle: Left ventricular ejection fraction, by estimation, is 30%. The left ventricle has moderately decreased function. The left ventricle demonstrates global hypokinesis. Definity contrast agent was given IV to delineate the left ventricular endocardial borders. The left ventricular internal cavity size was normal in size. There is mild left ventricular hypertrophy. Right Ventricle: The right ventricular size is normal. Right vetricular wall thickness was not assessed. Right ventricular systolic function is normal. Left Atrium: Left atrial size was normal in size. Right Atrium: Right atrial size was normal in size. Pericardium: There is no evidence of pericardial effusion. Mitral Valve:  There is mild thickening of the mitral valve leaflet(s). Mild to moderate mitral annular calcification. Trivial mitral valve regurgitation. Tricuspid Valve: The tricuspid valve is normal in structure. Tricuspid valve regurgitation is trivial. Aortic Valve: The aortic valve is tricuspid. Aortic valve regurgitation is not visualized. Pulmonic Valve: The pulmonic valve was normal in structure. Pulmonic valve regurgitation is not visualized. Aorta: The aortic root and ascending aorta are structurally normal, with no evidence of dilitation. Venous: The inferior vena cava is normal in size with greater than 50% respiratory variability, suggesting right atrial pressure of 3 mmHg. IAS/Shunts: The interatrial septum was not assessed.  LEFT VENTRICLE PLAX 2D LVIDd:         5.00 cm   Diastology LVIDs:         4.40 cm   LV e' lateral: 5.33 cm/s LV PW:         1.20 cm LV IVS:        1.10 cm LVOT diam:     2.30 cm LV SV:         55 LV SV Index:   23 LVOT Area:     4.15 cm  RIGHT VENTRICLE RV S prime:     14.40 cm/s LEFT ATRIUM         Index LA diam:    4.60 cm 1.88 cm/m  AORTIC VALVE LVOT Vmax:    65.50 cm/s LVOT Vmean:  45.200 cm/s LVOT VTI:    0.133 m  AORTA Ao Root diam: 3.00 cm Ao Asc diam:  3.50 cm  SHUNTS Systemic VTI:  0.13 m Systemic Diam: 2.30 cm Dietrich Pates MD Electronically signed by Dietrich Pates MD Signature Date/Time: 06/05/2023/9:33:38 PM    Final    DG Abd Portable 1V Result Date: 06/05/2023 CLINICAL DATA:  Feeding tube placement. EXAM: PORTABLE ABDOMEN - 1 VIEW COMPARISON:  None Available. FINDINGS: Distal tip of feeding tube is seen in expected position of distal stomach. IMPRESSION: Distal tip of feeding tube seen in expected position of distal stomach. Electronically Signed   By: Lupita Raider M.D.   On: 06/05/2023 15:49   CT HEAD WO CONTRAST Result Date: 06/05/2023 CLINICAL DATA:  Stroke, follow-up. Status post intracranial mechanical thrombectomy and stenting of the left ICA bifurcation. EXAM: CT HEAD WITHOUT CONTRAST TECHNIQUE: Contiguous axial images were obtained from the base of the skull through the vertex without intravenous contrast. RADIATION DOSE REDUCTION: This exam was performed according to the departmental dose-optimization program which includes automated exposure control, adjustment of the mA and/or kV according to patient size and/or use of iterative reconstruction technique. COMPARISON:  CT head without contrast and CT angio head and neck 06/05/2023. By plain CT 06/05/2023. FINDINGS: Brain: The study is mildly degraded by patient motion. The left insular and left opercular infarct is somewhat obscured by patient motion. The cortex is slightly hyperdense, potentially reflecting reperfusion. No hemorrhage is present. Basal ganglia are intact. Subcortical white matter hypoattenuation in the high left frontal lobe is new. Mild right-sided white matter disease is stable. Basal ganglia are intact. A remote lacunar infarct is again noted in the right cerebellum. The brainstem and cerebellum are otherwise within normal limits. Vascular: Atherosclerotic calcifications are present  within the cavernous internal carotid arteries and at the normal origin of both vertebral arteries. No hyperdense vessel is present. Skull: Calvarium is intact. No focal lytic or blastic lesions are present. No significant extracranial soft tissue lesion is present. Sinuses/Orbits: The paranasal sinuses and mastoid air cells are clear. Bilateral  lens replacements are noted. Globes and orbits are otherwise unremarkable. IMPRESSION: 1. The left insular and left opercular infarct is somewhat obscured by patient motion. 2. The cortex is slightly hyperdense, potentially reflecting reperfusion. 3. No hemorrhage. 4. Subcortical white matter hypoattenuation in the high left frontal lobe is new. This may reflect ischemic changes or edema related to the reperfusion. 5. Remote lacunar infarct of the right cerebellum. 6. Stable mild right-sided white matter disease. This likely reflects the sequela of chronic microvascular ischemia. Electronically Signed   By: Marin Roberts M.D.   On: 06/05/2023 14:38   IR PERCUTANEOUS ART THROMBECTOMY/INFUSION INTRACRANIAL INC DIAG ANGIO Result Date: 06/05/2023 INDICATION: 83 year old male presenting with right-sided weakness and aphasia; NIHSS 28. His last known well was 10 p.m. on 06/04/2023. His past medical history significant for prior stroke, hypertension, diabetes and chronic kidney disease; baseline modified Rankin scale 0. Head CT showed hypodensity within the left insula, basal ganglia and left frontal operculum (ASPECTS 7). No IV thrombolytic given as patient was outside the window. CT angiogram of the head and neck showed an occlusion of a left M2/MCA anterior division branch. CT perfusion showed a 41 mL core infarct with a 45 mL ischemic penumbra. She was transferred to our service for mechanical thrombectomy. EXAM: ULTRASOUND-GUIDED VASCULAR ACCESS DIAGNOSTIC CEREBRAL ANGIOGRAM MECHANICAL THROMBECTOMY FLAT PANEL HEAD CT LEFT CAROTID STENTING AND ANGIOPLASTY WITHOUT  CEREBRAL PROTECTION DEVICE COMPARISON:  CT/CT angiogram of the head and neck June 05, 2023. MEDICATIONS: No antibiotics administered. ANESTHESIA/SEDATION: The procedure was performed under general anesthesia. CONTRAST:  80 mL of Omnipaque 300 milligram/mL FLUOROSCOPY: Radiation Exposure Index (as provided by the fluoroscopic device): 1315 mGy Kerma COMPLICATIONS: None immediate. TECHNIQUE: Informed written consent was obtained from the patient's wife after a thorough discussion of the procedural risks, benefits and alternatives. All questions were addressed. Maximal Sterile Barrier Technique was utilized including caps, mask, sterile gowns, sterile gloves, sterile drape, hand hygiene and skin antiseptic. A timeout was performed prior to the initiation of the procedure. The right groin was prepped and draped in the usual sterile fashion. Using a micropuncture kit and the modified Seldinger technique, access was gained to the right common femoral artery and an 8 French sheath was placed. Real-time ultrasound guidance was utilized for vascular access including the acquisition of a permanent ultrasound image documenting patency of the accessed vessel. Under fluoroscopy, an 8 [place] Walrus balloon guide catheter was navigated over a 6 [place] VTK catheter and a 0.035" Terumo Glidewire into the aortic arch. The catheter was placed into the left common carotid artery and then advanced into the left internal carotid artery. The diagnostic catheter was removed. Frontal and lateral angiograms of the head were obtained. FINDINGS: 1. Ultrasound showed heavily calcified right common femoral artery is maintained patency and caliber. 2. Proximal occlusion of a left M2/MCA anterior division branch. 3. Atherosclerotic changes of the intracranial left ICA with mild stenosis at the distal cavernous segment. 4. 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 PROCEDURE: Using biplane roadmap guidance, a Red 62 aspiration catheter was navigated over Colossus 35 microguidewire into the cavernous segment of the left ICA. The aspiration catheter was then advanced to the level of occlusion and connected to an aspiration pump. Continuous aspiration was performed for 2 minutes. The guide catheter was connected to a VacLok syringe and the guiding catheter balloon was inflated. The aspiration catheter was subsequently removed under constant  aspiration. The guide catheter was aspirated for debris. Left internal carotid artery angiograms with frontal and lateral views of the head showed complete recanalization of the left MCA vascular tree. The guide catheter was retracted into the neck. Frontal and lateral angiograms of the neck were obtained. Improvement of the degree of stenosis in the carotid bulb compared to prior CT angiogram. There is prominent luminal irregularity at the carotid bulb residual moderate stenosis. Increased tortuosity of the proximal/mid cervical left ICA with kinking. Left internal carotid artery angiograms with left anterior oblique views of the neck showed evidence of filling defects at the level of the carotid bulb. Flat panel CT of the head was obtained and post processed in a separate workstation with concurrent attending physician supervision. Selected images were sent to PACS. No evidence of hemorrhagic complication. There is mild contrast staining of the left insular and frontal cortex. Repeat left internal carotid artery angiograms with frontal and lateral views of the head showed Amy seen left M3/MCA branch to the left parietal region. Left common carotid artery angiograms with frontal and lateral views of the neck showed vertebra Gretchen of stenosis at the left ICA bulb with more prominent filling defect. At this point, patient was loaded on cangrelor followed by continuous drip. Using biplane roadmap guidance, a 4-7 mm  Emboshield NAV6 cerebral protection device was advanced into the cervical left ICA. However, multiple attempts to advance a cerebral protection device through the left ICA kinking proved unsuccessful. The cerebral protection device was subsequently removed. Using biplane roadmap guidance, a 10-8 x 40 mm XACT carotid stent was navigated and deployed from the distal left common carotid artery to the proximal left internal carotid artery, proximal to the vessel kinking. Suboptimal stent expansion was noted. Then, a 6 x 30 mm Viatrac balloon was navigated into the recently deployed stent. Angioplasty was performed under fluoroscopy. Left internal carotid artery angiograms with frontal and lateral views of the neck showed adequate stent positioning and expansion with brisk anterograde flow. Left internal carotid artery angiograms with frontal and lateral views of the head showed improvement of anterograde flow in the left MCA vascular tree with brisk anterograde flow. Delayed left common carotid artery angiograms with frontal and lateral views of the neck showed no evidence of clot formation within the stent. The catheter was subsequently Riddle. Right common femoral artery angiogram was obtained in right anterior oblique view. The puncture is at the level of the common femoral artery. The artery has normal caliber, adequate for closure device. The sheath was exchanged over the wire for an 8 [place] Angio-Seal which was utilized for access closure. Immediate hemostasis was achieved. IMPRESSION: 1. Successful mechanical thrombectomy for treatment of a proximal left M2/MCA anterior division branch occlusion achieving complete recanalization (TICI 3). 2. 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 PLAN: Continue cangrelor infusion until patient is transitioned to oral dual  antiplatelet therapy. Electronically Signed   By: Baldemar Lenis M.D.   On: 06/05/2023 13:16   IR US Guide Vasc Access Right Result Date: 06/05/2023 INDICATION: 83 year old male presenting with right-sided weakness and aphasia; NIHSS 28. His last known well was 10 p.m. on 06/04/2023. His past medical history significant for prior stroke, hypertension, diabetes and chronic kidney disease; baseline modified Rankin scale 0. Head CT showed hypodensity within the left insula, basal ganglia and left frontal operculum (ASPECTS 7). No IV thrombolytic given  as patient was outside the window. CT angiogram of the head and neck showed an occlusion of a left M2/MCA anterior division branch. CT perfusion showed a 41 mL core infarct with a 45 mL ischemic penumbra. She was transferred to our service for mechanical thrombectomy. EXAM: ULTRASOUND-GUIDED VASCULAR ACCESS DIAGNOSTIC CEREBRAL ANGIOGRAM MECHANICAL THROMBECTOMY FLAT PANEL HEAD CT LEFT CAROTID STENTING AND ANGIOPLASTY WITHOUT CEREBRAL PROTECTION DEVICE COMPARISON:  CT/CT angiogram of the head and neck June 05, 2023. MEDICATIONS: No antibiotics administered. ANESTHESIA/SEDATION: The procedure was performed under general anesthesia. CONTRAST:  80 mL of Omnipaque 300 milligram/mL FLUOROSCOPY: Radiation Exposure Index (as provided by the fluoroscopic device): 1315 mGy Kerma COMPLICATIONS: None immediate. TECHNIQUE: Informed written consent was obtained from the patient's wife after a thorough discussion of the procedural risks, benefits and alternatives. All questions were addressed. Maximal Sterile Barrier Technique was utilized including caps, mask, sterile gowns, sterile gloves, sterile drape, hand hygiene and skin antiseptic. A timeout was performed prior to the initiation of the procedure. The right groin was prepped and draped in the usual sterile fashion. Using a micropuncture kit and the modified Seldinger technique, access was gained to the right  common femoral artery and an 8 French sheath was placed. Real-time ultrasound guidance was utilized for vascular access including the acquisition of a permanent ultrasound image documenting patency of the accessed vessel. Under fluoroscopy, an 8 [place] Walrus balloon guide catheter was navigated over a 6 [place] VTK catheter and a 0.035" Terumo Glidewire into the aortic arch. The catheter was placed into the left common carotid artery and then advanced into the left internal carotid artery. The diagnostic catheter was removed. Frontal and lateral angiograms of the head were obtained. FINDINGS: 1. Ultrasound showed heavily calcified right common femoral artery is maintained patency and caliber. 2. Proximal occlusion of a left M2/MCA anterior division branch. 3. Atherosclerotic changes of the intracranial left ICA with mild stenosis at the distal cavernous segment. 4. 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 PROCEDURE: Using biplane roadmap guidance, a Red 62 aspiration catheter was navigated over Colossus 35 microguidewire into the cavernous segment of the left ICA. The aspiration catheter was then advanced to the level of occlusion and connected to an aspiration pump. Continuous aspiration was performed for 2 minutes. The guide catheter was connected to a VacLok syringe and the guiding catheter balloon was inflated. The aspiration catheter was subsequently removed under constant aspiration. The guide catheter was aspirated for debris. Left internal carotid artery angiograms with frontal and lateral views of the head showed complete recanalization of the left MCA vascular tree. The guide catheter was retracted into the neck. Frontal and lateral angiograms of the neck were obtained. Improvement of the degree of stenosis in the carotid bulb compared to prior CT angiogram. There is prominent luminal irregularity at  the carotid bulb residual moderate stenosis. Increased tortuosity of the proximal/mid cervical left ICA with kinking. Left internal carotid artery angiograms with left anterior oblique views of the neck showed evidence of filling defects at the level of the carotid bulb. Flat panel CT of the head was obtained and post processed in a separate workstation with concurrent attending physician supervision. Selected images were sent to PACS. No evidence of hemorrhagic complication. There is mild contrast staining of the left insular and frontal cortex. Repeat left internal carotid artery angiograms with frontal and lateral views of the head showed Amy seen left M3/MCA  branch to the left parietal region. Left common carotid artery angiograms with frontal and lateral views of the neck showed vertebra Gretchen of stenosis at the left ICA bulb with more prominent filling defect. At this point, patient was loaded on cangrelor followed by continuous drip. Using biplane roadmap guidance, a 4-7 mm Emboshield NAV6 cerebral protection device was advanced into the cervical left ICA. However, multiple attempts to advance a cerebral protection device through the left ICA kinking proved unsuccessful. The cerebral protection device was subsequently removed. Using biplane roadmap guidance, a 10-8 x 40 mm XACT carotid stent was navigated and deployed from the distal left common carotid artery to the proximal left internal carotid artery, proximal to the vessel kinking. Suboptimal stent expansion was noted. Then, a 6 x 30 mm Viatrac balloon was navigated into the recently deployed stent. Angioplasty was performed under fluoroscopy. Left internal carotid artery angiograms with frontal and lateral views of the neck showed adequate stent positioning and expansion with brisk anterograde flow. Left internal carotid artery angiograms with frontal and lateral views of the head showed improvement of anterograde flow in the left MCA vascular tree  with brisk anterograde flow. Delayed left common carotid artery angiograms with frontal and lateral views of the neck showed no evidence of clot formation within the stent. The catheter was subsequently Riddle. Right common femoral artery angiogram was obtained in right anterior oblique view. The puncture is at the level of the common femoral artery. The artery has normal caliber, adequate for closure device. The sheath was exchanged over the wire for an 8 [place] Angio-Seal which was utilized for access closure. Immediate hemostasis was achieved. IMPRESSION: 1. Successful mechanical thrombectomy for treatment of a proximal left M2/MCA anterior division branch occlusion achieving complete recanalization (TICI 3). 2. 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 PLAN: Continue cangrelor infusion until patient is transitioned to oral dual antiplatelet therapy. Electronically Signed   By: Baldemar Lenis M.D.   On: 06/05/2023 13:16   IR INTRAVSC STENT CERV CAROTID W/O EMB-PROT MOD SED Result Date: 06/05/2023 INDICATION: 83 year old male presenting with right-sided weakness and aphasia; NIHSS 28. His last known well was 10 p.m. on 06/04/2023. His past medical history significant for prior stroke, hypertension, diabetes and chronic kidney disease; baseline modified Rankin scale 0. Head CT showed hypodensity within the left insula, basal ganglia and left frontal operculum (ASPECTS 7). No IV thrombolytic given as patient was outside the window. CT angiogram of the head and neck showed an occlusion of a left M2/MCA anterior division branch. CT perfusion showed a 41 mL core infarct with a 45 mL ischemic penumbra. She was transferred to our service for mechanical thrombectomy. EXAM: ULTRASOUND-GUIDED VASCULAR ACCESS DIAGNOSTIC CEREBRAL ANGIOGRAM MECHANICAL THROMBECTOMY  FLAT PANEL HEAD CT LEFT CAROTID STENTING AND ANGIOPLASTY WITHOUT CEREBRAL PROTECTION DEVICE COMPARISON:  CT/CT angiogram of the head and neck June 05, 2023. MEDICATIONS: No antibiotics administered. ANESTHESIA/SEDATION: The procedure was performed under general anesthesia. CONTRAST:  80 mL of Omnipaque 300 milligram/mL FLUOROSCOPY: Radiation Exposure Index (as provided by the fluoroscopic device): 1315 mGy Kerma COMPLICATIONS: None immediate. TECHNIQUE: Informed written consent was obtained from the patient's wife after a thorough discussion of the procedural risks, benefits and alternatives. All questions were addressed. Maximal Sterile Barrier Technique was utilized including caps, mask, sterile gowns, sterile gloves, sterile drape, hand hygiene and skin antiseptic. A timeout was performed prior to the  initiation of the procedure. The right groin was prepped and draped in the usual sterile fashion. Using a micropuncture kit and the modified Seldinger technique, access was gained to the right common femoral artery and an 8 French sheath was placed. Real-time ultrasound guidance was utilized for vascular access including the acquisition of a permanent ultrasound image documenting patency of the accessed vessel. Under fluoroscopy, an 8 [place] Walrus balloon guide catheter was navigated over a 6 [place] VTK catheter and a 0.035" Terumo Glidewire into the aortic arch. The catheter was placed into the left common carotid artery and then advanced into the left internal carotid artery. The diagnostic catheter was removed. Frontal and lateral angiograms of the head were obtained. FINDINGS: 1. Ultrasound showed heavily calcified right common femoral artery is maintained patency and caliber. 2. Proximal occlusion of a left M2/MCA anterior division branch. 3. Atherosclerotic changes of the intracranial left ICA with mild stenosis at the distal cavernous segment. 4. A 2-3 mm laterally projecting saccular aneurysm of the  cavernous segment of the left ICA (extradural), similar to prior MR angiogram performed 2021. PROCEDURE: Using biplane roadmap guidance, a Red 62 aspiration catheter was navigated over Colossus 35 microguidewire into the cavernous segment of the left ICA. The aspiration catheter was then advanced to the level of occlusion and connected to an aspiration pump. Continuous aspiration was performed for 2 minutes. The guide catheter was connected to a VacLok syringe and the guiding catheter balloon was inflated. The aspiration catheter was subsequently removed under constant aspiration. The guide catheter was aspirated for debris. Left internal carotid artery angiograms with frontal and lateral views of the head showed complete recanalization of the left MCA vascular tree. The guide catheter was retracted into the neck. Frontal and lateral angiograms of the neck were obtained. Improvement of the degree of stenosis in the carotid bulb compared to prior CT angiogram. There is prominent luminal irregularity at the carotid bulb residual moderate stenosis. Increased tortuosity of the proximal/mid cervical left ICA with kinking. Left internal carotid artery angiograms with left anterior oblique views of the neck showed evidence of filling defects at the level of the carotid bulb. Flat panel CT of the head was obtained and post processed in a separate workstation with concurrent attending physician supervision. Selected images were sent to PACS. No evidence of hemorrhagic complication. There is mild contrast staining of the left insular and frontal cortex. Repeat left internal carotid artery angiograms with frontal and lateral views of the head showed Amy seen left M3/MCA branch to the left parietal region. Left common carotid artery angiograms with frontal and lateral views of the neck showed vertebra Gretchen of stenosis at the left ICA bulb with more prominent filling defect. At this point, patient was loaded on cangrelor  followed by continuous drip. Using biplane roadmap guidance, a 4-7 mm Emboshield NAV6 cerebral protection device was advanced into the cervical left ICA. However, multiple attempts to advance a cerebral protection device through the left ICA kinking proved unsuccessful. The cerebral protection device was subsequently removed. Using biplane roadmap guidance, a 10-8 x 40 mm XACT carotid stent was navigated and deployed from the distal left common carotid artery to the proximal left internal carotid artery, proximal to the vessel kinking. Suboptimal stent expansion was noted. Then, a 6 x 30 mm Viatrac balloon was navigated into the recently deployed stent. Angioplasty was performed under fluoroscopy. Left internal carotid artery angiograms with frontal and lateral views of the neck showed adequate stent positioning and expansion with  brisk anterograde flow. Left internal carotid artery angiograms with frontal and lateral views of the head showed improvement of anterograde flow in the left MCA vascular tree with brisk anterograde flow. Delayed left common carotid artery angiograms with frontal and lateral views of the neck showed no evidence of clot formation within the stent. The catheter was subsequently Riddle. Right common femoral artery angiogram was obtained in right anterior oblique view. The puncture is at the level of the common femoral artery. The artery has normal caliber, adequate for closure device. The sheath was exchanged over the wire for an 8 [place] Angio-Seal which was utilized for access closure. Immediate hemostasis was achieved. IMPRESSION: 1. Successful mechanical thrombectomy for treatment of a proximal left M2/MCA anterior division branch occlusion achieving complete recanalization (TICI 3). 2. 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 PLAN: Continue  cangrelor infusion until patient is transitioned to oral dual antiplatelet therapy. Electronically Signed   By: Baldemar Lenis M.D.   On: 06/05/2023 13:16   IR CT Head Ltd Result Date: 06/05/2023 INDICATION: 83 year old male presenting with right-sided weakness and aphasia; NIHSS 28. His last known well was 10 p.m. on 06/04/2023. His past medical history significant for prior stroke, hypertension, diabetes and chronic kidney disease; baseline modified Rankin scale 0. Head CT showed hypodensity within the left insula, basal ganglia and left frontal operculum (ASPECTS 7). No IV thrombolytic given as patient was outside the window. CT angiogram of the head and neck showed an occlusion of a left M2/MCA anterior division branch. CT perfusion showed a 41 mL core infarct with a 45 mL ischemic penumbra. She was transferred to our service for mechanical thrombectomy. EXAM: ULTRASOUND-GUIDED VASCULAR ACCESS DIAGNOSTIC CEREBRAL ANGIOGRAM MECHANICAL THROMBECTOMY FLAT PANEL HEAD CT LEFT CAROTID STENTING AND ANGIOPLASTY WITHOUT CEREBRAL PROTECTION DEVICE COMPARISON:  CT/CT angiogram of the head and neck June 05, 2023. MEDICATIONS: No antibiotics administered. ANESTHESIA/SEDATION: The procedure was performed under general anesthesia. CONTRAST:  80 mL of Omnipaque 300 milligram/mL FLUOROSCOPY: Radiation Exposure Index (as provided by the fluoroscopic device): 1315 mGy Kerma COMPLICATIONS: None immediate. TECHNIQUE: Informed written consent was obtained from the patient's wife after a thorough discussion of the procedural risks, benefits and alternatives. All questions were addressed. Maximal Sterile Barrier Technique was utilized including caps, mask, sterile gowns, sterile gloves, sterile drape, hand hygiene and skin antiseptic. A timeout was performed prior to the initiation of the procedure. The right groin was prepped and draped in the usual sterile fashion. Using a micropuncture kit and the modified  Seldinger technique, access was gained to the right common femoral artery and an 8 French sheath was placed. Real-time ultrasound guidance was utilized for vascular access including the acquisition of a permanent ultrasound image documenting patency of the accessed vessel. Under fluoroscopy, an 8 [place] Walrus balloon guide catheter was navigated over a 6 [place] VTK catheter and a 0.035" Terumo Glidewire into the aortic arch. The catheter was placed into the left common carotid artery and then advanced into the left internal carotid artery. The diagnostic catheter was removed. Frontal and lateral angiograms of the head were obtained. FINDINGS: 1. Ultrasound showed heavily calcified right common femoral artery is maintained patency and caliber. 2. Proximal occlusion of a left M2/MCA anterior division branch. 3. Atherosclerotic changes of the intracranial left ICA with mild stenosis at the distal cavernous segment. 4. A 2-3 mm laterally projecting saccular aneurysm of the  cavernous segment of the left ICA (extradural), similar to prior MR angiogram performed 2021. PROCEDURE: Using biplane roadmap guidance, a Red 62 aspiration catheter was navigated over Colossus 35 microguidewire into the cavernous segment of the left ICA. The aspiration catheter was then advanced to the level of occlusion and connected to an aspiration pump. Continuous aspiration was performed for 2 minutes. The guide catheter was connected to a VacLok syringe and the guiding catheter balloon was inflated. The aspiration catheter was subsequently removed under constant aspiration. The guide catheter was aspirated for debris. Left internal carotid artery angiograms with frontal and lateral views of the head showed complete recanalization of the left MCA vascular tree. The guide catheter was retracted into the neck. Frontal and lateral angiograms of the neck were obtained. Improvement of the degree of stenosis in the carotid bulb compared to prior CT  angiogram. There is prominent luminal irregularity at the carotid bulb residual moderate stenosis. Increased tortuosity of the proximal/mid cervical left ICA with kinking. Left internal carotid artery angiograms with left anterior oblique views of the neck showed evidence of filling defects at the level of the carotid bulb. Flat panel CT of the head was obtained and post processed in a separate workstation with concurrent attending physician supervision. Selected images were sent to PACS. No evidence of hemorrhagic complication. There is mild contrast staining of the left insular and frontal cortex. Repeat left internal carotid artery angiograms with frontal and lateral views of the head showed Amy seen left M3/MCA branch to the left parietal region. Left common carotid artery angiograms with frontal and lateral views of the neck showed vertebra Gretchen of stenosis at the left ICA bulb with more prominent filling defect. At this point, patient was loaded on cangrelor followed by continuous drip. Using biplane roadmap guidance, a 4-7 mm Emboshield NAV6 cerebral protection device was advanced into the cervical left ICA. However, multiple attempts to advance a cerebral protection device through the left ICA kinking proved unsuccessful. The cerebral protection device was subsequently removed. Using biplane roadmap guidance, a 10-8 x 40 mm XACT carotid stent was navigated and deployed from the distal left common carotid artery to the proximal left internal carotid artery, proximal to the vessel kinking. Suboptimal stent expansion was noted. Then, a 6 x 30 mm Viatrac balloon was navigated into the recently deployed stent. Angioplasty was performed under fluoroscopy. Left internal carotid artery angiograms with frontal and lateral views of the neck showed adequate stent positioning and expansion with brisk anterograde flow. Left internal carotid artery angiograms with frontal and lateral views of the head showed  improvement of anterograde flow in the left MCA vascular tree with brisk anterograde flow. Delayed left common carotid artery angiograms with frontal and lateral views of the neck showed no evidence of clot formation within the stent. The catheter was subsequently Riddle. Right common femoral artery angiogram was obtained in right anterior oblique view. The puncture is at the level of the common femoral artery. The artery has normal caliber, adequate for closure device. The sheath was exchanged over the wire for an 8 [place] Angio-Seal which was utilized for access closure. Immediate hemostasis was achieved. IMPRESSION: 1. Successful mechanical thrombectomy for treatment of a proximal left M2/MCA anterior division branch occlusion achieving complete recanalization (TICI 3). 2. 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 PLAN: Continue cangrelor infusion until patient is transitioned to oral dual antiplatelet therapy.  Electronically Signed   By: Baldemar Lenis M.D.   On: 06/05/2023 13:16   CT ANGIO HEAD NECK W WO CM W PERF (CODE STROKE) Addendum Date: 06/05/2023 ADDENDUM REPORT: 06/05/2023 12:25 ADDENDUM: Please note, there is a dictation error within CTA neck impression #1, which should read: The common carotid and internal carotid arteries are patent within the neck. Atherosclerotic plaque bilaterally. Most notably, there is progressive atherosclerotic plaque about the left carotid bifurcation and within the proximal left ICA with resultant severe near occlusive stenosis of the proximal left ICA. Also of note, atherosclerotic plaque about the right carotid bifurcation results in a 40% stenosis at the right ICA origin. Electronically Signed   By: Jackey Loge D.O.   On: 06/05/2023 12:25   Result Date: 06/05/2023 CLINICAL DATA:  Provided history:  Cerebrovascular accident, unspecified mechanism. Right-sided weakness. Right-sided facial droop. Altered mental status. EXAM: CT ANGIOGRAPHY HEAD AND NECK CT PERFUSION BRAIN TECHNIQUE: Multidetector CT imaging of the head and neck was performed using the standard protocol during bolus administration of intravenous contrast. Multiplanar CT image reconstructions and MIPs were obtained to evaluate the vascular anatomy. Carotid stenosis measurements (when applicable) are obtained utilizing NASCET criteria, using the distal internal carotid diameter as the denominator. Multiphase CT imaging of the brain was performed following IV bolus contrast injection. Subsequent parametric perfusion maps were calculated using RAPID software. RADIATION DOSE REDUCTION: This exam was performed according to the departmental dose-optimization program which includes automated exposure control, adjustment of the mA and/or kV according to patient size and/or use of iterative reconstruction technique. CONTRAST:  OMNIPAQUE IOHEXOL 350 MG/ML SOLN COMPARISON:  Noncontrast head CT performed earlier today 06/05/2023. MRA head and MRA neck 10/04/2019. FINDINGS: CTA NECK FINDINGS Aortic arch: Common origin of the innominate and left common carotid arteries. Atherosclerotic plaque within the visualized thoracic aorta and proximal major branch vessels of the neck. Streak/beam hardening artifact arising from a dense right-sided contrast bolus partially obscures the right subclavian artery. Within this limitation, there is no appreciable hemodynamically significant innominate or proximal subclavian artery stenosis. Right carotid system: CCA and ICA patent within the neck. Atherosclerotic plaque, greatest about the carotid bifurcation. Resultant 40% stenosis at the ICA origin. Left carotid system: CCA and ICA patent within the neck. Atherosclerotic plaque. Most notably, there is prominent atherosclerotic plaque about the carotid bifurcation and  within the proximal ICA which has progressed from the prior MRA neck of 10/04/2019. Resultant severe (near occlusive) stenosis of the proximal ICA. Tortuosity of the cervical ICA Vertebral arteries: The vertebral arteries are patent within the neck. Streak/beam hardening artifact limits evaluation of the right vertebral artery origin. At least moderate stenosis is suspected at this site. Atherosclerotic plaque scattered elsewhere within the cervical right vertebral artery with no more than mild stenosis. Calcified atherosclerotic plaque at the left vertebral artery origin with suspected at least moderate stenosis. Nonstenotic calcified plaque elsewhere within the cervical left vertebral artery. Skeleton: Cervical spondylosis. Other neck: No neck mass or cervical lymphadenopathy. Upper chest: No consolidation within the imaged lung apices. Review of the MIP images confirms the above findings CTA HEAD FINDINGS Anterior circulation: The intracranial internal carotid arteries are patent. As sclerotic plaque within both vessels. No more than mild stenosis on the right. Up to moderate stenosis within the left cavernous segment. The M1 middle cerebral arteries are patent. Abrupt occlusion of a proximal M2 left middle cerebral artery vessel (series 11, image 22). Atherosclerotic irregularity of the M2 and more distal MCA vessels  elsewhere. The anterior cerebral arteries are patent. Atherosclerotic irregularity of both vessels without high-grade proximal stenosis. A possible 2 mm periophthalmic left ICA aneurysm with better appreciated on the prior MRA head of 10/04/2019. Posterior circulation: The intracranial vertebral arteries are patent. Atherosclerotic plaque within the right V4 segment sites of mild stenosis. Non-stenotic atherosclerotic plaque within the left V4 segment. The basilar artery is patent. The posterior cerebral arteries are patent. Posterior communicating arteries are diminutive or absent, bilaterally.  Venous sinuses: Assessment for dural venous sinus thrombosis is limited due to contrast timing. Anatomic variants: As described. Review of the MIP images confirms the above findings CT Brain Perfusion Findings: ASPECTS: CBF (<30%) Volume: 41mL Perfusion (Tmax>6.0s) volume: 86mL Mismatch Volume: 45mL Infarction Location:Left MCA vascular territory CTA head impression #1, the CT perfusion head impression and the presence of a severe stenosis of the proximal cervical left ICA called by telephone at the time of interpretation on 06/05/2023 at 8:40 am to provider ERIC [facility] , who verbally acknowledged these results. IMPRESSION: CTA neck: 1. No common carotid and internal carotid arteries are patent within the neck. Atherosclerotic plaque bilaterally. Most notably, there is progressive atherosclerotic plaque about the left carotid bifurcation and within the proximal left ICA with resultant severe, near occlusive stenosis of the proximal left ICA. Also of note, atherosclerotic plaque about the right carotid bifurcation results in 40% stenosis at the right ICA origin. 2. The vertebral arteries are patent within the neck. Atherosclerotic plaque bilaterally as described. Most notably, there is suspected at least moderate stenoses at the bilateral vertebral artery origins. 3. Aortic Atherosclerosis (ICD10-I70.0). CTA head: 1. Abrupt occlusion of a proximal M2 left middle cerebral artery vessel. 2. Background intracranial atherosclerotic disease as described. 3. A possible 2 mm periophthalmic left ICA aneurysm was better appreciated on the prior MRA head of 10/04/2019. CT perfusion head: The perfusion software identifies a 41 mL core infarct in the left MCA vascular territory. The perfusion software identifies an 86 mL region of critically hypoperfused parenchyma within the left MCA vascular territory (utilizing the Tmax>6 seconds threshold). Reported mismatch volume: 45 mL Electronically Signed: By: Jackey Loge D.O. On:  06/05/2023 09:07   CT HEAD CODE STROKE WO CONTRAST Result Date: 06/05/2023 CLINICAL DATA:  Code stroke. Neuro deficit, acute, stroke suspected. EXAM: CT HEAD WITHOUT CONTRAST TECHNIQUE: Contiguous axial images were obtained from the base of the skull through the vertex without intravenous contrast. RADIATION DOSE REDUCTION: This exam was performed according to the departmental dose-optimization program which includes automated exposure control, adjustment of the mA and/or kV according to patient size and/or use of iterative reconstruction technique. COMPARISON:  Brain MRI 10/04/2019.  Noncontrast head CT 10/03/2019. FINDINGS: Brain: Generalized cerebral atrophy. Loss of gray-white differentiation consistent with an acute infarct within the left insula and within portions of the left frontal operculum (MCA vascular territory). Known small chronic cortically-based infarcts within the left frontal, left parietal and left occipital lobes were better appreciated on the prior brain MRI of 10/04/2019 (acute at that time). Mild patchy and ill-defined hypoattenuation within the cerebral white matter, nonspecific but compatible with chronic small vessel ischemic disease. Subcentimeter infarct within the superior right cerebellar hemisphere, new from the prior MRI but chronic in appearance. Loss of gray-white differentiation there is no acute intracranial hemorrhage. No extra-axial fluid collection. No evidence of an intracranial mass. No midline shift. Vascular: No hyperdense vessel.  Atherosclerotic calcifications. Skull: No calvarial fracture or aggressive osseous lesion. Sinuses/Orbits: No mass or acute finding within the imaged  orbits. No significant paranasal sinus disease. ASPECTS [facility] Stroke Program Early CT Score) - Ganglionic level infarction (caudate, lentiform nuclei, internal capsule, insula, M1-M3 cortex): 5 - Supraganglionic infarction (M4-M6 cortex): 2 Total score (0-10 with 10 being normal): 7 Impression  #1 called by telephone at the time of interpretation on 06/05/2023 at 8:40 am to provider Dr. Otelia Limes, who verbally acknowledged these results. IMPRESSION: 1. Acute left MCA territory infarct affecting the left insula and portions of the left frontal operculum. ASPECTS is 7. 2. Known small chronic cortically-based infarcts within the left frontal, left parietal and left occipital lobes were better appreciated on the prior brain MRI of 10/04/2019 (acute at that time). 3. Background mild cerebral white matter chronic small vessel ischemic disease. 4. Subcentimeter infarct within the right cerebellar hemisphere, new from prior MRI but chronic in appearance. 5. Generalized cerebral atrophy. Electronically Signed   By: Jackey Loge D.O.   On: 06/05/2023 08:45     PHYSICAL EXAM  Temp:  [97.9 F (36.6 C)-99.4 F (37.4 C)] 99.4 F (37.4 C) (02/25 0400) Pulse Rate:  [61-147] 92 (02/25 0702) Resp:  [21-40] 25 (02/25 0702) BP: (69-195)/(38-116) 115/56 (02/25 0700) SpO2:  [82 %-100 %] 91 % (02/25 0702) FiO2 (%):  [50 %] 50 % (02/25 0354) Weight:  [134.8 kg] 134.8 kg (02/25 0500)  General -intubated elderly patient in no acute distress  Cardiovascular -regular rhythm on monitor  Neuro: Still intubated and vent dependent.  On Precedex and fentanyl.  He is unresponsive and consistent with a sedated exam.  Keeps eyes closed.  With forced eye opening appears to have upward gaze.  No blink to threat.  Pupils appear to be reactive bilaterally.  Cough and gag present. Motor: Withdrawal to pain on the left side.     ASSESSMENT/PLAN Mr. Kawika Bischoff. is a 83 y.o. male with history of hypertension, diabetes, CKD 3, stroke admitted for right-sided weakness numbness, aphasia, right facial droop, left gaze preference. No TNK given due to outside window.  Patient underwent mechanical thrombectomy with left ICA stenting.   2/15 in the morning patient had an episode of vomiting with coffee-ground emesis and  subsequent tachycardia and tachypnea.  On evaluation this morning, he had increased work of breathing and was unable to maintain his SpO2 even on 100% O2 via nonrebreather.  Discussion was had with patient's wife and daughter, and family stated they would like to proceed with intubation and would like for patient to remain full code at this time.  CCM was consulted, and patient was transferred to ICU and intubated.  He did have an episode of aspiration during intubation and bronchoscopy was subsequently performed.  He was noted to be hypotensive after intubation, and Levophed and vasopressin were started.  Will hold Plavix for now given coffee-ground emesis.  Patient's creatinine was also noted to have worsened. Due to her worsening kidney function and need for CRRT, CCM will be primary on the patient.   Stroke:  left MCA infarct with left M2 occlusion and left ICA near occlusion s/p IR with TICI3 and left ICA stenting, likely secondary to large vessel disease source versus cardiomyopathy with low EF CT left MCA infarct CT head and neck left M2 occlusion, left ICA near occlusion, right ICA 43 stenosis, bilateral VA origin severe stenosis CTP 41/86 Status post IR with TICI3 and left ICA stenting MRI left MCA infarct at left insular and left frontal operculum, prominent and confluent petechial hemorrhagic transformation CT repeat 2/7 stable confluent  petechial hemorrhage 2D Echo EF 30% LDL 107 HgbA1c 6.1 P2 Y12 = 73 UDS negative SCDs for VTE prophylaxis aspirin 81 mg daily and clopidogrel 75 mg daily prior to admission, now on ASA and Plavix with stable hemoglobin. Ongoing aggressive stroke risk factor management Therapy recommendations:  SNF Disposition: Pending   History of stroke 10/08/2019 admitted for left MCA infarct due to right upper extremity weakness.  MRA head and neck showed left ICA 30% stenosis.  EF 45 to 50%.  LDL 105, A1c 5.6.  Recommended loop recorder at that time but only got  30-day CardioNet monitoring which was no A-fib.  Discharged on DAPT and Lipitor 80.  Respiratory failure Leukocytosis On 2/15, patient had episode of emesis with probable subsequent aspiration and went into respiratory distress with increased work of breathing unable to maintain SpO2 After discussion with family, patient intubated for airway protection and given respiratory failure Now on precedex and fentanyl Ventilator management per CCM - failed SBT 2/19 Fever Tmax 102.5 WBC 10.1--23.5--44.5--41.4--29.9--19.5--7.4--4.9--5.7-14.3 cefepime-> Rocephin treatment completed. Off vancomycin  GI bleeding Acute blood loss anemia Patient had 2 episodes of coffee-ground emesis, likely stress ulcer Resume Plavix 2/18 Resume ASA 2/20  Hemoglobin 11.2-> 8.4->PRBC->8.8->8.1-> 7.6->8.3-> 8.6->7.6>8.7>8.0 Close monitoring  Cardiomyopathy CHF 09/2019 EF 45 to 50% Current admission EF 30% Cardiology on board, appreciate assistance On DAPT, Agree with GDMT (losartan, entresto, metoprolol and spironolactone) as BP and Cr tolerates  Diabetes HgbA1c 6.1 goal < 7.0 Hyperglycemia improved  Now off insulin drip CBG monitoring SSI  DM education and close PCP follow up  History of hypertension, now hypotensive Stable now But requiring Levophed Off epi and vasopressin Long term BP goal normotensive  Hyperlipidemia Home meds: Lipitor 80 LDL 105, goal < 70 Now on lipitor 80 and Zetia  Continue statin and zetia at discharge  Dysphagia Poststroke dysphagia Speech on board N.p.o. now OG tube reinserted Start trickle feeds 2/18  AKI on CKD  Creatinine 1.78->3.41-> 4.61->4.07-> 3.85->3.66->4.19->5.66 --> 6.92>2.35 Baseline creatinine around 1.5 Renally dose medications as appropriate Avoid contrast Aggressive treat hypotension Nephrology following now on CCRT  Other Stroke Risk Factors Advanced age Obesity, Body mass index is 42.64 kg/m.   Other Active Problems Presyncopal episode  with mild hypotension 2/14-suspect was due to straining to have a bowel movement, will prescribe as needed suppository  Hospital day # 20  On more sedation due to agitation.  Tachypneic overnight.  Low grade fever. Continuing CCRT.  Now on Levophed drip.  Discussed with daughter in the room family are still discussing goals of care.  Continue full care at the moment.  Appreciate CCM care.     This patient is critically ill due to respiratory distress, left MCA stroke s/p IR intervention worsening kidney function and at significant risk of neurological worsening, death form heart failure, respiratory failure, recurrent stroke, bleeding from [facility], seizure, sepsis. This patient's care requires constant monitoring of vital signs, hemodynamics, respiratory and cardiac monitoring, review of multiple databases, neurological assessment, discussion with family, other specialists and medical decision making of high complexity. I spent 35 minutes of neurocritical care time in the care of this patient.   Kamari Bilek,MD

## 2023-06-25 NOTE — Progress Notes (Signed)
 E-link notified that patient is currently on 19 mcg/min of levophed, and currently not able to have any fluid removed via CRRT due to blood pressure. E-link also notified of 6 beat run of v-tach occurring at 2235.

## 2023-06-26 DIAGNOSIS — K567 Ileus, unspecified: Secondary | ICD-10-CM | POA: Diagnosis not present

## 2023-06-26 DIAGNOSIS — Z7189 Other specified counseling: Secondary | ICD-10-CM | POA: Diagnosis not present

## 2023-06-26 DIAGNOSIS — I639 Cerebral infarction, unspecified: Secondary | ICD-10-CM | POA: Diagnosis not present

## 2023-06-26 DIAGNOSIS — N179 Acute kidney failure, unspecified: Secondary | ICD-10-CM | POA: Diagnosis not present

## 2023-06-26 DIAGNOSIS — J690 Pneumonitis due to inhalation of food and vomit: Secondary | ICD-10-CM | POA: Diagnosis not present

## 2023-06-26 DIAGNOSIS — J9601 Acute respiratory failure with hypoxia: Secondary | ICD-10-CM | POA: Diagnosis not present

## 2023-06-26 DIAGNOSIS — Z515 Encounter for palliative care: Secondary | ICD-10-CM | POA: Diagnosis not present

## 2023-06-26 DIAGNOSIS — I6389 Other cerebral infarction: Secondary | ICD-10-CM | POA: Diagnosis not present

## 2023-06-26 LAB — GLUCOSE, CAPILLARY
Glucose-Capillary: 175 mg/dL — ABNORMAL HIGH (ref 70–99)
Glucose-Capillary: 177 mg/dL — ABNORMAL HIGH (ref 70–99)
Glucose-Capillary: 236 mg/dL — ABNORMAL HIGH (ref 70–99)
Glucose-Capillary: 249 mg/dL — ABNORMAL HIGH (ref 70–99)
Glucose-Capillary: 251 mg/dL — ABNORMAL HIGH (ref 70–99)
Glucose-Capillary: 285 mg/dL — ABNORMAL HIGH (ref 70–99)

## 2023-06-26 LAB — RENAL FUNCTION PANEL
Albumin: 2.3 g/dL — ABNORMAL LOW (ref 3.5–5.0)
Albumin: 2 g/dL — ABNORMAL LOW (ref 3.5–5.0)
Anion gap: 13 (ref 5–15)
Anion gap: 8 (ref 5–15)
BUN: 29 mg/dL — ABNORMAL HIGH (ref 8–23)
BUN: 39 mg/dL — ABNORMAL HIGH (ref 8–23)
CO2: 21 mmol/L — ABNORMAL LOW (ref 22–32)
CO2: 24 mmol/L (ref 22–32)
Calcium: 8.7 mg/dL — ABNORMAL LOW (ref 8.9–10.3)
Calcium: 8.3 mg/dL — ABNORMAL LOW (ref 8.9–10.3)
Chloride: 103 mmol/L (ref 98–111)
Chloride: 102 mmol/L (ref 98–111)
Creatinine, Ser: 1.97 mg/dL — ABNORMAL HIGH (ref 0.61–1.24)
Creatinine, Ser: 2.57 mg/dL — ABNORMAL HIGH (ref 0.61–1.24)
GFR, Estimated: 33 mL/min — ABNORMAL LOW (ref >60)
GFR, Estimated: 24 mL/min — ABNORMAL LOW (ref >60)
Glucose, Bld: 171 mg/dL — ABNORMAL HIGH (ref 70–99)
Glucose, Bld: 294 mg/dL — ABNORMAL HIGH (ref 70–99)
Phosphorus: 2.8 mg/dL (ref 2.5–4.6)
Phosphorus: 2.8 mg/dL (ref 2.5–4.6)
Potassium: 4.5 mmol/L (ref 3.5–5.1)
Potassium: 5.6 mmol/L — ABNORMAL HIGH (ref 3.5–5.1)
Sodium: 137 mmol/L (ref 135–145)
Sodium: 134 mmol/L — ABNORMAL LOW (ref 135–145)

## 2023-06-26 LAB — LACTIC ACID, PLASMA: Lactic Acid, Venous: 3.3 mmol/L (ref 0.5–1.9)

## 2023-06-26 LAB — MAGNESIUM: Magnesium: 2.5 mg/dL — ABNORMAL HIGH (ref 1.7–2.4)

## 2023-06-26 LAB — PROCALCITONIN: Procalcitonin: 3.2 ng/mL

## 2023-06-26 MED ORDER — VASOPRESSIN 20 UNITS/100 ML INFUSION FOR SHOCK
0.0000 [IU]/min | INTRAVENOUS | Status: DC
  Administered 2023-06-26 – 2023-06-27 (×2): 0.03 [IU]/min via INTRAVENOUS
  Filled 2023-06-26 (×2): qty 100

## 2023-06-26 MED ORDER — ALBUMIN HUMAN 25 % IV SOLN: 12.5000 g | Freq: Once | INTRAVENOUS | Status: DC

## 2023-06-26 MED ORDER — BISACODYL 10 MG RE SUPP: 10.0000 mg | Freq: Every day | RECTAL | Status: DC | PRN

## 2023-06-26 MED ORDER — INSULIN ASPART 100 UNIT/ML IJ SOLN
10.0000 [IU] | Freq: Once | INTRAMUSCULAR | Status: AC
  Administered 2023-06-26: 10 [IU] via INTRAVENOUS

## 2023-06-26 MED ORDER — ALBUMIN HUMAN 5 % IV SOLN
25.0000 g | Freq: Once | INTRAVENOUS | Status: AC
  Administered 2023-06-26: 25 g via INTRAVENOUS
  Filled 2023-06-26: qty 500

## 2023-06-26 MED ORDER — LACTATED RINGERS IV BOLUS
250.0000 mL | Freq: Once | INTRAVENOUS | Status: AC
  Administered 2023-06-26: 250 mL via INTRAVENOUS

## 2023-06-26 MED ORDER — DEXTROSE 50 % IV SOLN
25.0000 mL | Freq: Once | INTRAVENOUS | Status: AC
  Administered 2023-06-26: 25 mL via INTRAVENOUS
  Filled 2023-06-26: qty 50

## 2023-06-26 MED ORDER — LACTATED RINGERS IV BOLUS: 250.0000 mL | Freq: Once | INTRAVENOUS | Status: DC

## 2023-06-26 MED ORDER — NOREPINEPHRINE 16 MG/250ML-% IV SOLN
0.0000 ug/min | INTRAVENOUS | Status: DC
  Administered 2023-06-26: 16 ug/min via INTRAVENOUS
  Administered 2023-06-26: 29 ug/min via INTRAVENOUS
  Administered 2023-06-27: 40 ug/min via INTRAVENOUS
  Administered 2023-06-27: 24 ug/min via INTRAVENOUS
  Administered 2023-06-27 – 2023-06-28 (×3): 40 ug/min via INTRAVENOUS
  Administered 2023-06-28: 38 ug/min via INTRAVENOUS
  Administered 2023-06-29: 60 ug/min via INTRAVENOUS
  Administered 2023-06-29: 56 ug/min via INTRAVENOUS
  Filled 2023-06-26 (×3): qty 250
  Filled 2023-06-26: qty 500
  Filled 2023-06-26 (×6): qty 250

## 2023-06-26 MED ORDER — LACTATED RINGERS IV BOLUS
500.0000 mL | Freq: Once | INTRAVENOUS | Status: AC
  Administered 2023-06-26: 500 mL via INTRAVENOUS

## 2023-06-26 MED ORDER — CALCIUM GLUCONATE-NACL 1-0.675 GM/50ML-% IV SOLN
1.0000 g | Freq: Once | INTRAVENOUS | Status: AC
  Administered 2023-06-26: 1000 mg via INTRAVENOUS
  Filled 2023-06-26: qty 50

## 2023-06-26 MED ORDER — QUETIAPINE FUMARATE 100 MG PO TABS
100.0000 mg | ORAL_TABLET | Freq: Two times a day (BID) | ORAL | Status: DC
  Administered 2023-06-26 – 2023-06-30 (×10): 100 mg
  Filled 2023-06-26 (×10): qty 1

## 2023-06-26 MED ORDER — INSULIN ASPART 100 UNIT/ML IJ SOLN
6.0000 [IU] | INTRAMUSCULAR | Status: DC
  Administered 2023-06-26 – 2023-06-29 (×15): 6 [IU] via SUBCUTANEOUS

## 2023-06-26 NOTE — Progress Notes (Signed)
 Palliative:  HPI: 83 y.o. male with past medical history of HTN, HLD, diabetes, CKD stage 3b, prior CVA with no residual deficits, prostate cancer, failure to thrive admitted on 06/05/2023 with unresponsiveness with right-sided deficits found to have left MCA infarct with left M2 occlusion and left ICA near occlusion s/p IR with mechanical thrombectomy and stenting 2/5. 2/15 aspiration event resulting in severe shock requiring intubation. Hospitalization also complicated by acute GIB and CHF EF 30%.   I met today at Nathan Proctor' bedside along with daughter, Nathan Proctor. Nathan Proctor and I reviewed her father's progress and obstacles since his admission. We are able to review the concern for his kidney function. Unfortunately I believe this is a major barrier to path forward as we would like. CRRT on pause right now but concern as he was requiring increasing dosages of vasopressor support even on CRRT. We were able to discuss that CRRT is a more gentle form of dialysis only provided in ICU. We also discussed vasopressor support is also a support only provided in ICU. We reviewed that dialysis is very tough on the body for everyone and it is not an easy medical intervention to tolerate. Nathan Proctor expresses understanding. Nathan Proctor expresses that their prayer will be for his kidneys to heal and function on their own.   Nathan Proctor and I reviewed her family's faith and hopes. She shares that they are a family of positivity. They believe in prayer. We discussed having faith and trust in God to care for her father as her father's path is already written for him. I encouraged family to continue to pray and continue to support each other through this journey. We acknowledged the difficulty in seeing her father in this state and the difficulty in the uncertainty of what is to come. She reports that her mother is unable to come to bedside today and is taking time to think and process at home today.   Update: I left voicemail with daughter, Nathan Proctor  (whom I have interacted with previously). I left HIPAA compliant voicemail requesting that we can sit down and speak tomorrow. Will await return call and/or follow up again tomorrow.   Update: I returned to bedside when wife arrived. I spent time reviewing with wife Nathan Proctor' changes and obstacles since we last spoke. We discussed the struggles of dialysis and poor tolerance of gentle CRRT and how his blood pressure is restricting the efficacy of his dialysis treatment. I expressed to Nathan Proctor that this puts [place] in a much different place. Unfortunately there are many decisions to be made and consider. Nathan Proctor expresses her desire to make the right decision for her husband. She is trying to process to make good, thoughtful decisions for him. She is trying to process the information from the medical team and marry this with her hope and faith. We reviewed plan for her to spend some time with her husband this evening and plans for [place] to meet tomorrow to talk through paths from here - she will check with her daughter, Nathan Proctor, to see if she can participate in conversation in person or via phone with [place].   All questions/concerns addressed to the best of my ability. Emotional support provided.   Exam: Awakens and eyes open to voice. Follows some simple commands. Tachycardic. Tolerating vent via ETT - no distress. Abd soft. Toes cool to touch.   Plan: - Ongoing palliative support and family conversations - I am very concerned about his progress since I last visited 2/20  90 min  Yong Channel, NP Palliative Medicine Team Pager [phone] (Please see [email or web address] for schedule) Team Phone [phone]

## 2023-06-26 NOTE — TOC Progression Note (Signed)
 Transition of Care (TOC) - Progression Note    Patient Details  Name: Nathan Proctor. MRN: [id number] Date of Birth: 1940/06/16  Transition of Care [facility]) CM/SW Contact  Mearl Latin, LCSW Phone Number: 06/26/2023, 5:06 PM  Clinical Narrative:    CSW received consult to speak with family regarding discharge facility options. CSW left voicemail for patient's daughter, Zella Ball.  At this point, CSW unsure of the plan needed for patient so will continue to follow. CSW did inquire with [facility] to see if they are in network with patient's insurance. Response pending from [facility]. Per [facility], they are not in network but patient's plan has out of network benefits that they can pursue if needed. They do not currently have any HD beds available.    Expected Discharge Plan: Skilled Nursing Facility Barriers to Discharge: Continued Medical Work up, English as a second language teacher  Expected Discharge Plan and Services In-house Referral: Clinical Social Work   Post Acute Care Choice: Skilled Nursing Facility Living arrangements for the past 2 months: Single Family Home                                       Social Determinants of Health (SDOH) Interventions SDOH Screenings   Food Insecurity: Patient Unable To Answer (06/07/2023)  Housing: Patient Unable To Answer (06/07/2023)  Transportation Needs: Patient Unable To Answer (06/07/2023)  Utilities: Patient Unable To Answer (06/07/2023)  Depression (PHQ2-9): Low Risk  (12/03/2019)  Social Connections: Patient Unable To Answer (06/07/2023)  Tobacco Use: Medium Risk (06/13/2023)    Readmission Risk Interventions    06/26/2023    5:05 PM  Readmission Risk Prevention Plan  Transportation Screening Complete  Medication Review (RN Care Manager) Complete  PCP or Specialist appointment within 3-5 days of discharge Complete  HRI or Home Care Consult Complete  SW Recovery Care/Counseling Consult Complete  Palliative Care Screening  Complete  Skilled Nursing Facility Complete

## 2023-06-26 NOTE — Progress Notes (Addendum)
 PCCM Brief Note  Events: Hypotensive and NE up to 20. Fever of 100.4 x 1. 2. CRRT off at 0930 this AM. BMP from 1540: K 5.6, SCr 2.57 (1.97 this AM), BUN 39 (29 this AM), corrected Ca 9.9.  Interventions: 500cc LR bolus and 25g of 5% Albumin. Add procalcitonin, lactic acid. 2. Lokelma x 1, insulin 10u, Dextrose 1/2 amp (CBG's 200s earlier), Calcium gluconate. Repeat BMP at 2300 [facility] to follow. 3. Will message nephrology as likely needs to resume CRRT. 4. Will talk to family once again as making a turn in the wrong direction on top of already being critically ill.   Addendum: 1850 Worsening hypotension, NE at 30.  Will add Vaso.   Additional CC time: 30 min.   Rutherford Guys, PA - [facility] For pager details, please see AMION or use Epic chat  After 1900, please call [facility] for cross coverage needs 06/26/2023, 5:26 PM

## 2023-06-26 NOTE — Progress Notes (Signed)
 STROKE TEAM PROGRESS NOTE   SUBJECTIVE (INTERVAL HISTORY)   On levophed and fentanyl ggt.. Albumin added overnight due to CCRT.   Discussion with nephrology Dr. Thedore Mins together with family.  We gave family updates on his lack of progression.  Palliative will come by to discuss further with them.  Family so far is expressed that they want to continue care and asked about long-term options with trach and vent.  OBJECTIVE Temp:  [98.2 F (36.8 C)-99.6 F (37.6 C)] 98.7 F (37.1 C) (02/26 0400) Pulse Rate:  [74-125] 115 (02/26 0755) Cardiac Rhythm: Normal sinus rhythm (02/26 0400) Resp:  [11-40] 20 (02/26 0755) BP: (78-142)/(39-99) 102/41 (02/26 0755) SpO2:  [88 %-99 %] 92 % (02/26 0755) FiO2 (%):  [50 %-60 %] 60 % (02/26 0755) Weight:  [132.4 kg] 132.4 kg (02/26 0500)  Recent Labs  Lab 06/25/23 1518 06/25/23 1904 06/25/23 2330 06/26/23 0327 06/26/23 0745  GLUCAP 256* 210* 179* 249* 177*   Recent Labs  Lab 06/20/23 4098 06/21/23 0602 06/22/23 1191 06/23/23 0509 06/23/23 1623 06/24/23 0520 06/24/23 1553 06/24/23 2353 06/25/23 0541 06/25/23 1621  NA  --  148*   < > 137 137 136 134* 137 135 138  K  --  3.9   < > 3.9 4.1 4.2 4.9 4.7 5.1 5.1  CL  --  114*   < > 104 104 102 103  --  107 102  CO2  --  20*   < > 21* 23 24 23   --  20* 25  GLUCOSE  --  227*   < > 250* 350* 252* 355*  --  199* 231*  BUN  --  131*   < > 91* 79* 54* 50*  --  42* 36*  CREATININE  --  5.66*   < > 4.23* 3.68* 2.80* 2.47*  --  2.35* 2.26*  CALCIUM  --  9.0   < > 8.3* 8.1* 8.7* 8.4*  --  7.9* 8.9  MG 2.3 2.4  --  2.4  --  2.4  --   --  2.4  --   PHOS 4.6 5.4*   < > 3.1 2.8 3.0 3.2  --  3.4 3.1   < > = values in this interval not displayed.   Recent Labs  Lab 06/23/23 1623 06/24/23 0520 06/24/23 1553 06/25/23 0541 06/25/23 1621  ALBUMIN 1.9* 2.0* 2.1* 2.1* 2.3*    Recent Labs  Lab 06/19/23 1544 06/20/23 0447 06/21/23 0602 06/24/23 2353 06/25/23 0541  WBC 4.9 5.7 7.3  --  14.3*   NEUTROABS  --   --   --   --  11.4*  HGB 8.3* 8.6* 7.6* 8.2* 8.0*  HCT 27.1* 28.0* 25.3* 24.0* 26.3*  MCV 100.0 100.7* 103.3*  --  100.8*  PLT 115* 113* 110*  --  132*       Component Value Date/Time   CHOL 183 06/06/2023 0557   TRIG 109 06/19/2023 0031   HDL 60 06/06/2023 0557   CHOLHDL 3.1 06/06/2023 0557   VLDL 16 06/06/2023 0557   LDLCALC 107 (H) 06/06/2023 0557   LDLCALC 223 (H) 05/31/2021 0000   Lab Results  Component Value Date   HGBA1C 6.1 (H) 06/05/2023      Component Value Date/Time   LABOPIA NONE DETECTED 06/05/2023 1653   COCAINSCRNUR NONE DETECTED 06/05/2023 1653   LABBENZ NONE DETECTED 06/05/2023 1653   AMPHETMU NONE DETECTED 06/05/2023 1653   THCU NONE DETECTED 06/05/2023 1653   LABBARB NONE DETECTED  06/05/2023 1653    No results for input(s): "ETH" in the last 168 hours.   DG CHEST PORT 1 VIEW Result Date: 06/25/2023 CLINICAL DATA:  ETT placement EXAM: PORTABLE CHEST 1 VIEW COMPARISON:  06/24/2023 FINDINGS: Endotracheal tube terminates 7 cm above the carina. Patchy bilateral opacities, favoring bibasilar atelectasis. Small left pleural effusion. No pneumothorax. The heart is normal in size. Right IJ venous catheter terminates at the cavoatrial junction. Left IJ venous catheter terminates in the upper SVC. Enteric tube courses into the stomach. IMPRESSION: Endotracheal tube terminates 7 cm above the carina. Patchy bilateral opacities, favoring bibasilar atelectasis. Small left pleural effusion. Electronically Signed   By: Charline Bills M.D.   On: 06/25/2023 02:23   DG Chest Port 1 View Result Date: 06/24/2023 CLINICAL DATA:  Intubated EXAM: PORTABLE CHEST 1 VIEW COMPARISON:  06/24/2023 FINDINGS: Single frontal view of the chest image straits endotracheal tube overlying tracheal air column, tip at the level of the thoracic inlet approximately 8.5 cm above carina. Enteric catheter passes below diaphragm tip excluded by collimation. Bilateral internal jugular  catheters are unchanged, distal margins projecting over the superior vena cava. Cardiac silhouette is stable. Stable bibasilar consolidation and bilateral pleural effusions. No pneumothorax. No acute bony abnormalities. IMPRESSION: 1. Endotracheal tube tip at the thoracic inlet, approximately 8.5 cm above carina. 2. Other support devices as above. 3. Persistent bibasilar consolidation and effusions. Electronically Signed   By: Sharlet Salina M.D.   On: 06/24/2023 22:43   DG CHEST PORT 1 VIEW Result Date: 06/23/2023 CLINICAL DATA:  Central line placement EXAM: PORTABLE CHEST 1 VIEW COMPARISON:  06/22/2023 FINDINGS: RIGHT central venous line with tip in distal SVC. LEFT central venous line with tip in the mid SVC. Endotracheal tube and feeding tube unchanged. No pneumothorax. Bilateral small effusions. Low lung volumes. IMPRESSION: 1. Bilateral central venous lines without complication. 2. Low lung volumes and small effusions. Electronically Signed   By: Genevive Bi M.D.   On: 06/23/2023 10:10   DG Chest Port 1 View Result Date: 06/22/2023 CLINICAL DATA:  Central line placement. EXAM: PORTABLE CHEST 1 VIEW COMPARISON:  June 19, 2023. FINDINGS: Stable cardiomediastinal silhouette. Hypoinflation of the lungs is noted with bibasilar subsegmental atelectasis and probable small pleural effusions. Endotracheal tube and feeding tube are unchanged. Left internal jugular catheter is unchanged. Interval placement of right internal jugular catheter with distal tip in expected position of the SVC. No pneumothorax. IMPRESSION: Interval placement of right internal jugular catheter with distal tip in expected position of the SVC. Electronically Signed   By: Lupita Raider M.D.   On: 06/22/2023 11:36   DG CHEST PORT 1 VIEW Result Date: 06/19/2023 CLINICAL DATA:  Respiratory distress. EXAM: PORTABLE CHEST 1 VIEW COMPARISON:  Radiograph 06/15/2023, CT 06/16/2023 FINDINGS: Tip of the endotracheal tube 4.5 cm from the  carina. Enteric tube and left central line unchanged in positioning. Slight improved right lung aeration with persistent basilar opacity. Slight worsening patchy opacity at the left lung base. No pneumothorax or large pleural effusion. No pulmonary edema. Stable heart size and mediastinal contours. IMPRESSION: 1. Bibasilar opacities, improving on the right but worsening on the left. 2. Stable support apparatus. Electronically Signed   By: Narda Rutherford M.D.   On: 06/19/2023 16:02   DG Abd Portable 1V Result Date: 06/18/2023 CLINICAL DATA:  Orogastric tube placement. EXAM: PORTABLE ABDOMEN - 1 VIEW COMPARISON:  Radiographs 06/05/2023.  Abdominal CT 06/16/2023. FINDINGS: 1236 hours. Tip of the enteric tube projects over the right  upper quadrant of the abdomen, likely in the distal stomach. There is a small amount contrast material within the stomach. The visualized bowel gas pattern is normal. Patient is rotated to the right with persistent right basilar airspace disease and a small right pleural effusion. IMPRESSION: Enteric tube tip projects over the distal stomach. Electronically Signed   By: Carey Bullocks M.D.   On: 06/18/2023 14:48   CT CHEST ABDOMEN PELVIS WO CONTRAST Result Date: 06/16/2023 CLINICAL DATA:  Septicemia.  Pneumonia. EXAM: CT CHEST, ABDOMEN AND PELVIS WITHOUT CONTRAST TECHNIQUE: Multidetector CT imaging of the chest, abdomen and pelvis was performed following the standard protocol without IV contrast. RADIATION DOSE REDUCTION: This exam was performed according to the departmental dose-optimization program which includes automated exposure control, adjustment of the mA and/or kV according to patient size and/or use of iterative reconstruction technique. COMPARISON:  CT angio chest from 04/04/2016. FINDINGS: CT CHEST FINDINGS Cardiovascular: Heart size is normal. Aortic atherosclerosis and multi vessel coronary artery calcifications. No pericardial effusion. Mediastinum/Nodes: Thyroid  gland, trachea and esophagus appear normal. ET tube is in place with tip above the carina. There is a enteric tube with tip in the stomach.No enlarged mediastinal lymph nodes. Hilar lymph nodes are suboptimally evaluated due to lack of IV contrast. Lungs/Pleura: No pleural effusion or interstitial edema. Bilateral lower lobe airspace consolidation is identified, right greater than left. Imaging findings compatible with multifocal pneumonia. Chronic subsegmental atelectasis this is within the right middle lobe and right lower lobe with asymmetric volume loss and elevation of the right hemidiaphragm. Musculoskeletal: No chest wall mass or suspicious bone lesions identified. CT ABDOMEN PELVIS FINDINGS Hepatobiliary: No focal liver abnormality. Gallbladder appears normal. No signs of bile duct dilatation. Pancreas: Unremarkable. No pancreatic ductal dilatation or surrounding inflammatory changes. Spleen: Normal in size without focal abnormality. Adrenals/Urinary Tract: Normal adrenal glands. Multiple stones identified within the upper pole of the left kidney which measure up to 7 mm. Bilateral exophytic kidney lesions of varying complexity are identified. These are suboptimally assessed on the current exam reflecting lack of IV contrast material. No signs of hydronephrosis. Urinary bladder is decompressed around a Foley catheter. Stomach/Bowel: Enteric tube tip is in the proximal stomach. There is retained enteric contrast material within the gastric fundus. The appendix is visualized and appears normal. There are several dilated loops of small bowel within the right hemiabdomen. The proximal small bowel loops are normal in caliber. Within the right upper quadrant of the abdomen there are multiple dilated loops of small bowel measuring up to 3.5 cm containing air-fluid levels. The bowel loops proximal and distal are normal in caliber. Within the limitations of unenhanced technique there is no significant bowel wall  thickening. Distal colonic diverticulosis identified without signs of acute diverticulitis. Retained enteric contrast material identified within the colon up to the rectum Vascular/Lymphatic: Aortic atherosclerosis without aneurysm. No signs of abdominopelvic adenopathy. Reproductive: Prostate gland is either atrophic or surgically absent. Penile prosthesis identified. Other: No free fluid or fluid collections. Musculoskeletal: No acute or significant osseous findings. IMPRESSION: 1. Bilateral lower lobe airspace consolidation, right greater than left. Imaging findings compatible with multifocal pneumonia. 2. Chronic subsegmental atelectasis within the right middle lobe and right lower lobe with asymmetric volume loss and elevation of the right hemidiaphragm. 3. Assessment of bowel pathology is limited due to lack of IV and enteric contrast material. There are multiple abnormally dilated loops of small bowel within the right hemiabdomen. Findings are compatible with a small-bowel obstruction. As there is normal  caliber proximal small bowel and normal caliber distal small bowel findings are concerning for either close loop obstruction or internal hernia. 4. Distal colonic diverticulosis without signs of acute diverticulitis. 5. Nonobstructing left renal calculi. 6. Bilateral exophytic kidney lesions of varying complexity are identified. These are suboptimally assessed on the current exam reflecting lack of IV contrast material. Further evaluation with nonemergent renal ultrasound is advised. 7.  Aortic Atherosclerosis (ICD10-I70.0). Electronically Signed   By: Signa Kell M.D.   On: 06/16/2023 15:51   ECHOCARDIOGRAM LIMITED Result Date: 06/15/2023    ECHOCARDIOGRAM LIMITED REPORT   Patient Name:   Nathan Proctor. Date of Exam: 06/15/2023 Medical Rec #:  [id number]              Height:       70.0 in Accession #:    [id number]             Weight:       263.9 lb Date of Birth:  1941/01/18              BSA:           2.348 m Patient Age:    83 years               BP:           121/53 mmHg Patient Gender: M                      HR:           90 bpm. Exam Location:  Inpatient Procedure: Limited Echo, Color Doppler and Intracardiac Opacification Agent            (Both Spectral and Color Flow Doppler were utilized during            procedure). Indications:    Stroke i63.9, Shock  History:        Patient has prior history of Echocardiogram examinations, most                 recent 06/05/2023. CHF; Risk Factors:Hypertension and                 Dyslipidemia.  Sonographer:    Irving Burton Senior RDCS Referring Phys: [phone] [facility]  Sonographer Comments: Technically difficult due to body habitus, scanned supine on artificial repirator IMPRESSIONS  1. Very difficult to determine LVEF to limited visualization and frequent ectopy. . Left ventricular ejection fraction, by estimation, is 40 to 45%. The left ventricle has mildly decreased function. Left ventricular endocardial border not optimally defined to evaluate regional wall motion.  2. Right ventricular systolic function was not well visualized. The right ventricular size is not well visualized.  3. The inferior vena cava is normal in size with <50% respiratory variability, suggesting right atrial pressure of 8 mmHg.  4. Limited echo FINDINGS  Left Ventricle: Very difficult to determine LVEF to limited visualization and frequent ectopy. Left ventricular ejection fraction, by estimation, is 40 to 45%. The left ventricle has mildly decreased function. Left ventricular endocardial border not optimally defined to evaluate regional wall motion. Definity contrast agent was given IV to delineate the left ventricular endocardial borders. Right Ventricle: The right ventricular size is not well visualized. Right vetricular wall thickness was not well visualized. Right ventricular systolic function was not well visualized. Venous: The inferior vena cava is normal in size with less than 50%  respiratory variability, suggesting right atrial pressure of 8 mmHg. Additional Comments:  Color Doppler performed.  Dina Rich MD Electronically signed by Dina Rich MD Signature Date/Time: 06/15/2023/4:31:38 PM    Final    DG Abd 1 View Result Date: 06/15/2023 CLINICAL DATA:  Orogastric tube placement. EXAM: ABDOMEN - 1 VIEW COMPARISON:  Radiograph 06/08/2023 FINDINGS: Tip of the enteric tube below the diaphragm in the stomach, the side port is just beyond the gastroesophageal junction. There is gaseous gastric distension. Dilated bowel in the right abdomen up 4.7 cm, potentially small bowel, but incompletely included in the field of view. IMPRESSION: 1. Tip of the enteric tube below the diaphragm in the stomach, side-port just beyond the gastroesophageal junction. 2. Gaseous gastric distension. Additional dilated bowel in the right abdomen likely small bowel, although incompletely included in the field of view. Electronically Signed   By: Narda Rutherford M.D.   On: 06/15/2023 15:06   DG CHEST PORT 1 VIEW Result Date: 06/15/2023 CLINICAL DATA:  10031 Cough 10031 EXAM: PORTABLE CHEST 1 VIEW COMPARISON:  June 08, 2023 FINDINGS: The cardiomediastinal silhouette is unchanged in contour.Unchanged elevation of the RIGHT hemidiaphragm. No pleural effusion. No pneumothorax. Bibasilar platelike opacities. Atherosclerotic calcifications. Similar background of interstitial prominence. IMPRESSION: Bibasilar platelike opacities, favored to reflect atelectasis. Electronically Signed   By: Meda Klinefelter M.D.   On: 06/15/2023 12:07   DG CHEST PORT 1 VIEW Result Date: 06/15/2023 CLINICAL DATA:  Respiratory failure, intubation and central line placement. EXAM: PORTABLE CHEST 1 VIEW COMPARISON:  Film earlier today at 0734 hours FINDINGS: Interval intubation with the endotracheal tube tip approximately 3.5 cm above the carina. Left jugular central line placement with the line tip in the upper SVC. No  pneumothorax. Volume loss of the right lung remains with elevation of the right hemidiaphragm. Improved expansion of the left lung. Potential underlying mild pulmonary interstitial edema. No significant pleural effusions. IMPRESSION: 1. Interval intubation and left jugular central line placement. No pneumothorax. 2. Improved expansion of the left lung. Persistent volume loss of the right lung with elevation of the right hemidiaphragm. Potential underlying mild pulmonary interstitial edema. Electronically Signed   By: Irish Lack M.D.   On: 06/15/2023 10:42   DG Swallowing Func-Speech Pathology Result Date: 06/12/2023 Table formatting from the original result was not included. Modified Barium Swallow Study Patient Details Name: Nathan Proctor. MRN: [id number] Date of Birth: [birth year] Today's Date: 06/12/2023 HPI/PMH: HPI: Pt is a 83 y.o. male who was admitted as a code stroke on 02/05 due to acute onset of right-sided weakness, right facial droop and aphasia. MRI from 02/06 displayed an evolving acute left MCA infarct, most pronounced at the left insula and left frontal operculum. Pt has intentionally lost 300 pounds in the recent past, with pre-loss weight at ~600Ib. PMHx includes hypertension, diabetes, CKD, prior CVA. Clinical Impression: Clinical Impression: Pt presents with a mild-moderate oropharyngeal dysphagia (DIGEST Score: 1) primarily characterized by silent penetration of thin liquid at level of VFs, flash penetration of nectar-thick above VFs, and diffuse pharyngeal residue. Pt's dysphagia severity level dropped from DIGEST score of 3 to 1, compared to previous MBS eval on 02/07.     Pharyngeal residue was present throughout all consistencies. Pt's posture for swallowing was consistently neutral. Pt follows commands for compensatory strategies more readily now with max multimodal cueing. Effective compensatory strategies include multiple swallows, throat clearing, and a straw when used with a  verbally cued slow rate. Multiple consecutive sips of thin-liquid presented the most risk for aspiration due to silent penetration of Vfs. Unlike  previous MBS study, pt tolerated solid consistency, displaying prolonged mastication but majority clearance of bolus after 2-3 swallows.     Recommendations include upgrading pt's diet to dysphagia 2 (finely chopped) and thin liquids to optimize safety and efficiency for swallowing. SLP f/u to monitor tolerance of thin-liquids with compensatory strategies is highly recommended. Pt needs to remain upright for 30 minutes after PO intake due to remaining pharyngeal residue to optimize airway safety. Factors that may increase risk of adverse event in presence of aspiration [name] & Clearance [name] 2021): Factors that may increase risk of adverse event in presence of aspiration [name] & Clearance [name] 2021): Dependence for feeding and/or oral hygiene Recommendations/Plan: Swallowing Evaluation Recommendations Swallowing Evaluation Recommendations Recommendations: PO diet PO Diet Recommendation: Dysphagia 2 (Finely chopped); Thin liquids (Level 0) Liquid Administration via: Straw Medication Administration: Crushed with puree Supervision: Full supervision/cueing for swallowing strategies; Full assist for feeding Swallowing strategies  : Slow rate; Small bites/sips; Multiple dry swallows after each bite/sip; Clear throat intermittently Postural changes: Position pt fully upright for meals; Stay upright 30-60 min after meals Oral care recommendations: Oral care BID (2x/day); Staff/trained caregiver to provide oral care Treatment Plan Treatment Plan Follow-up recommendations: Follow physicians's recommendations for discharge plan and follow up therapies Functional status assessment: Patient has had a recent decline in their functional status and demonstrates the ability to make significant improvements in function in a reasonable and predictable amount of time. Recommendations Recommendations  for follow up therapy are one component of a multi-disciplinary discharge planning process, led by the attending physician.  Recommendations may be updated based on patient status, additional functional criteria and insurance authorization. Assessment: Orofacial Exam: Orofacial Exam Oral Cavity: Oral Hygiene: WFL Oral Cavity - Dentition: Adequate natural dentition Orofacial Anatomy: WFL Anatomy: Anatomy: WFL Boluses Administered: Boluses Administered Boluses Administered: Thin liquids (Level 0); Mildly thick liquids (Level 2, nectar thick); Moderately thick liquids (Level 3, honey thick); Puree; Solid  Oral Impairment Domain: Oral Impairment Domain Lip Closure: Interlabial escape, no progression to anterior lip Tongue control during bolus hold: Cohesive bolus between tongue to palatal seal Bolus preparation/mastication: Slow prolonged chewing/mashing with complete recollection Bolus transport/lingual motion: Brisk tongue motion Oral residue: Residue collection on oral structures Location of oral residue : Tongue Initiation of pharyngeal swallow : Pyriform sinuses  Pharyngeal Impairment Domain: Pharyngeal Impairment Domain Soft palate elevation: No bolus between soft palate (SP)/pharyngeal wall (PW) Laryngeal elevation: Partial superior movement of thyroid cartilage/partial approximation of arytenoids to epiglottic petiole Anterior hyoid excursion: Complete anterior movement Epiglottic movement: Complete inversion Laryngeal vestibule closure: Incomplete, narrow column air/contrast in laryngeal vestibule Pharyngeal stripping wave : Present - complete Pharyngeal contraction (A/P view only): N/A Pharyngoesophageal segment opening: Complete distension and complete duration, no obstruction of flow Tongue base retraction: Trace column of contrast or air between tongue base and PPW Pharyngeal residue: Collection of residue within or on pharyngeal structures Location of pharyngeal residue: Diffuse (>3 areas)  Esophageal  Impairment Domain: Esophageal Impairment Domain Esophageal clearance upright position: -- (Not tested) Pill: No data recorded Penetration/Aspiration Scale Score: Penetration/Aspiration Scale Score 1.  Material does not enter airway: Solid; Puree; Moderately thick liquids (Level 3, honey thick) 2.  Material enters airway, remains ABOVE vocal cords then ejected out: Mildly thick liquids (Level 2, nectar thick) 5.  Material enters airway, CONTACTS cords and not ejected out: Thin liquids (Level 0) Compensatory Strategies: Compensatory Strategies Compensatory strategies: Yes Straw: Effective Effective Straw: Mildly thick liquid (Level 2, nectar thick) (Effective with slow rate applied.) Multiple swallows: Effective  Effective Multiple Swallows: Thin liquid (Level 0); Mildly thick liquid (Level 2, nectar thick); Moderately thick liquid (Level 3, honey thick); Puree; Solid   General Information: Caregiver present: No  Diet Prior to this Study: Dysphagia 1 (pureed); Moderately thick liquids (Level 3, honey thick)   Temperature : Normal   Respiratory Status: WFL   Supplemental O2: None (Room air)   History of Recent Intubation: Yes  Behavior/Cognition: Alert; Cooperative; Pleasant mood; Requires cueing Self-Feeding Abilities: Needs assist with self-feeding Baseline vocal quality/speech: Normal Volitional Cough: Unable to elicit Volitional Swallow: Able to elicit Exam Limitations: No limitations Goal Planning: Prognosis for improved oropharyngeal function: Good Barriers to Reach Goals: Language deficits No data recorded No data recorded No data recorded Pain: Pain Assessment Pain Assessment: No/denies pain Pain Score: 0 End of Session: Start Time:SLP Start Time (ACUTE ONLY): 1310 Stop Time: SLP Stop Time (ACUTE ONLY): 1335 Time Calculation:SLP Time Calculation (min) (ACUTE ONLY): 25 min Charges: SLP Evaluations $ SLP Speech Visit: 1 Visit SLP Evaluations $MBS Swallow: 1 Procedure $Swallowing Treatment: 1 Procedure SLP visit  diagnosis: SLP Visit Diagnosis: Dysphagia, oropharyngeal phase (R13.12) Past Medical History: Past Medical History: Diagnosis Date  Anemia of chronic disease   Ankle fracture 02/15/2016  Cancer (HCC)   Prostate  Chronic constipation   Chronic kidney disease   stage III  Diabetes mellitus without complication (HCC)   type II   Diabetic peripheral neuropathy (HCC)   Failure to thrive (0-17)   Fracture of left lower leg   Gout   Hyperlipidemia   Hypertension   Morbid obesity (HCC)   Unstable gait  Past Surgical History: Past Surgical History: Procedure Laterality Date  IR CT HEAD LTD  06/05/2023  IR INTRAVSC STENT CERV CAROTID W/O EMB-PROT MOD SED INC ANGIO  06/05/2023  IR PERCUTANEOUS ART THROMBECTOMY/INFUSION INTRACRANIAL INC DIAG ANGIO  06/05/2023  IR US GUIDE VASC ACCESS RIGHT  06/05/2023  ORIF ANKLE FRACTURE Left 02/29/2016  Procedure: OPEN REDUCTION INTERNAL FIXATION (ORIF) ANKLE FRACTURE;  Surgeon: Yolonda Kida, MD;  Location: [facility];  Service: Orthopedics;  Laterality: Left;  PROSTATE SURGERY    RADIOLOGY WITH ANESTHESIA N/A 06/05/2023  Procedure: IR WITH ANESTHESIA;  Surgeon: Radiologist, Medication, MD;  Location: [facility];  Service: Radiology;  Laterality: N/A; Claudine Mouton 06/12/2023, 3:16 PM  DG Abd 1 View Result Date: 06/09/2023 CLINICAL DATA:  [id number]. Encounter for feeding tube placement. EXAM: ABDOMEN - 1 VIEW COMPARISON:  Abdomen film 06/05/2023. FINDINGS: Dobbhoff feeding tube is well placed with the radiopaque tip in the distal stomach. The visualized bowel pattern is nonobstructive. There is barium newly seen in the flexures and transverse colon. There is no supine evidence of free air. There is atelectasis in the lung bases. Cardiomegaly. IMPRESSION: 1. Dobbhoff feeding tube well placed with the radiopaque tip in the distal stomach. 2. Nonobstructive bowel gas pattern. 3. Cardiomegaly. Electronically Signed   By: Almira Bar M.D.   On: 06/09/2023 03:41   DG CHEST PORT 1 VIEW Result  Date: 06/08/2023 CLINICAL DATA:  CHF EXAM: PORTABLE CHEST 1 VIEW COMPARISON:  01/06/2023 FINDINGS: Feeding tube in place. There is cardiomegaly with vascular congestion. Chronic elevation of the right hemidiaphragm with right base atelectasis. Interstitial prominence throughout the lungs, right greater than left could reflect interstitial edema. Possible small effusions. No acute bony abnormality. IMPRESSION: Cardiomegaly with vascular congestion and probable mild interstitial edema. Question small bilateral effusions. Chronic elevation of the right hemidiaphragm with right base atelectasis. Electronically Signed   By: Caryn Bee  Dover M.D.   On: 06/08/2023 18:20   DG Swallowing Func-Speech Pathology Result Date: 06/07/2023 Table formatting from the original result was not included. Modified Barium Swallow Study Study completed and documented by [name], SLP Student Supervised and reviewed by Harlon Ditty, MA CCC-SLP [facility] Secure Chat Preferred Office [phone] Patient Details Name: Nathan Proctor. MRN: [id number] Date of Birth: Oct 24, 1940 Today's Date: 06/07/2023 HPI/PMH: HPI: Pt is a 83 y.o. male who was admitted as a code stroke on 02/05 due to acute onset of right-sided weakness, right facial droop and aphasia. MRI from 02/06 displayed an evolving acute left MCA infarct, most pronounced at the left insula and left frontal operculum. Pt has intentionally lost 300 pounds in the recent past, with pre-loss weight at ~600Ib. PMHx includes hypertension, diabetes, CKD, prior CVA. Clinical Impression: Clinical Impression: Pt presented with a moderate-severe oropharyngeal dysphagia (DIGEST Score: 3) primarily characterized by silent aspiration of thin liquid, silent penetration of nectar-thick liquid, diffuse pharyngeal residue collection during thin/nectar-thick consistencies, and posterior-escape of bolus across multiple consistencies. Pt positioning was a potential limitation for  examination due to consistent posterior head position. More anterior head positioning during nectar-thick liquid displayed less pharyngeal residue, increased hyo-laryngeal excursion, and overall better airway safety. This positioning was not present throughout due to pt's receptive language deficits to understand cues. Alternating between honey-thick and puree consistencies provided more oropharyngeal bolus clearance and no signs of aspiration. Recommend a dysphagia 1 (puree) and honey-thick liquid diet to optimize pt's airway safety and potential for meeting nutritional needs. Slow rate, small bites/sips, natural head posturing, and multiple swallows are compensatory strategies that displayed best efficiency with pt. F/u with SLP for family education, compensatory strategy training, and upgraded diet trials is recommended. Factors that may increase risk of adverse event in presence of aspiration [name] & Clearance [name] 2021): Factors that may increase risk of adverse event in presence of aspiration [name] & Clearance [name] 2021): Weak cough Recommendations/Plan: Swallowing Evaluation Recommendations Swallowing Evaluation Recommendations Recommendations: PO diet PO Diet Recommendation: Dysphagia 1 (Pureed); Moderately thick liquids (Level 3, honey thick) Liquid Administration via: Spoon Medication Administration: Crushed with puree Supervision: Full supervision/cueing for swallowing strategies; Full assist for feeding Swallowing strategies  : Slow rate; Small bites/sips; Multiple dry swallows after each bite/sip (Pt needs to be in a neutral head position (no posterior head tilt).) Postural changes: Position pt fully upright for meals; Stay upright 30-60 min after meals Oral care recommendations: Oral care BID (2x/day); Staff/trained caregiver to provide oral care Caregiver Recommendations: Have oral suction available Treatment Plan Treatment Plan Treatment recommendations: Therapy as outlined in treatment plan below Follow-up  recommendations: Follow physicians's recommendations for discharge plan and follow up therapies Functional status assessment: Patient has had a recent decline in their functional status and demonstrates the ability to make significant improvements in function in a reasonable and predictable amount of time. Treatment frequency: Min 2x/week Treatment duration: 2 weeks Interventions: Aspiration precaution training; Compensatory techniques; Patient/family education; Diet toleration management by SLP; Trials of upgraded texture/liquids Recommendations Recommendations for follow up therapy are one component of a multi-disciplinary discharge planning process, led by the attending physician.  Recommendations may be updated based on patient status, additional functional criteria and insurance authorization. Assessment: Orofacial Exam: Orofacial Exam Oral Cavity - Dentition: Adequate natural dentition Orofacial Anatomy: WFL Anatomy: Anatomy: WFL Boluses Administered: Boluses Administered Boluses Administered: Thin liquids (Level 0); Mildly thick liquids (Level 2, nectar thick); Moderately thick liquids (Level 3, honey thick); Puree; Solid  Oral  Impairment Domain: Oral Impairment Domain Lip Closure: Escape progressing to mid-chin Tongue control during bolus hold: Posterior escape of greater than half of bolus Bolus preparation/mastication: -- (Pt did not chew solid and SLP had to remove it from mouth.) Bolus transport/lingual motion: Repetitive/disorganized tongue motion Oral residue: Residue collection on oral structures Location of oral residue : Tongue; Palate Initiation of pharyngeal swallow : Pyriform sinuses  Pharyngeal Impairment Domain: Pharyngeal Impairment Domain Soft palate elevation: No bolus between soft palate (SP)/pharyngeal wall (PW) Laryngeal elevation: Partial superior movement of thyroid cartilage/partial approximation of arytenoids to epiglottic petiole Anterior hyoid excursion: Partial anterior movement  Epiglottic movement: Partial inversion Laryngeal vestibule closure: Incomplete, narrow column air/contrast in laryngeal vestibule Pharyngeal stripping wave : Present - complete Pharyngeal contraction (A/P view only): N/A Pharyngoesophageal segment opening: Complete distension and complete duration, no obstruction of flow Tongue base retraction: Narrow column of contrast or air between tongue base and PPW Pharyngeal residue: Collection of residue within or on pharyngeal structures Location of pharyngeal residue: Diffuse (>3 areas)  Esophageal Impairment Domain: Esophageal Impairment Domain Esophageal clearance upright position: -- (Not tested.) Pill: No data recorded Penetration/Aspiration Scale Score: Penetration/Aspiration Scale Score 1.  Material does not enter airway: Puree; Moderately thick liquids (Level 3, honey thick) 3.  Material enters airway, remains ABOVE vocal cords and not ejected out: Mildly thick liquids (Level 2, nectar thick) 8.  Material enters airway, passes BELOW cords without attempt by patient to eject out (silent aspiration) : Thin liquids (Level 0) Compensatory Strategies: Compensatory Strategies Compensatory strategies: Yes Straw: Ineffective Multiple swallows: Effective (Required max cueing due to pt's language.) Effective Multiple Swallows: Puree; Moderately thick liquid (Level 3, honey thick); Mildly thick liquid (Level 2, nectar thick)   General Information: Caregiver present: No  Diet Prior to this Study: NPO   Temperature : Normal   Respiratory Status: WFL   Supplemental O2: None (Room air)   No data recorded Behavior/Cognition: Alert; Cooperative; Pleasant mood; Requires cueing Self-Feeding Abilities: Needs assist with self-feeding Baseline vocal quality/speech: Dysphonic Volitional Cough: Unable to elicit Volitional Swallow: Able to elicit Exam Limitations: Poor positioning Goal Planning: Prognosis for improved oropharyngeal function: Good Barriers to Reach Goals: Language deficits  No data recorded No data recorded Consulted and agree with results and recommendations: Pt unable/family or caregiver not available Pain: Pain Assessment Pain Assessment: No/denies pain Faces Pain Scale: 0 Facial Expression: 0 Body Movements: 0 Muscle Tension: 0 Compliance with ventilator (intubated pts.): N/A Vocalization (extubated pts.): 0 CPOT Total: 0 End of Session: Start Time:SLP Start Time (ACUTE ONLY): 0903 Stop Time: SLP Stop Time (ACUTE ONLY): 0930 Time Calculation:SLP Time Calculation (min) (ACUTE ONLY): 27 min Charges: SLP Evaluations $ SLP Speech Visit: 1 Visit SLP Evaluations $BSS Swallow: 1 Procedure $MBS Swallow: 1 Procedure $ SLP EVAL LANGUAGE/SOUND PRODUCTION: 1 Procedure $Swallowing Treatment: 1 Procedure SLP visit diagnosis: SLP Visit Diagnosis: Dysphagia, oropharyngeal phase (R13.12) Past Medical History: Past Medical History: Diagnosis Date  Anemia of chronic disease   Ankle fracture 02/15/2016  Cancer (HCC)   Prostate  Chronic constipation   Chronic kidney disease   stage III  Diabetes mellitus without complication (HCC)   type II   Diabetic peripheral neuropathy (HCC)   Failure to thrive (0-17)   Fracture of left lower leg   Gout   Hyperlipidemia   Hypertension   Morbid obesity (HCC)   Unstable gait  Past Surgical History: Past Surgical History: Procedure Laterality Date  IR CT HEAD LTD  06/05/2023  IR INTRAVSC STENT CERV  CAROTID W/O EMB-PROT MOD SED INC ANGIO  06/05/2023  IR PERCUTANEOUS ART THROMBECTOMY/INFUSION INTRACRANIAL INC DIAG ANGIO  06/05/2023  IR US GUIDE VASC ACCESS RIGHT  06/05/2023  ORIF ANKLE FRACTURE Left 02/29/2016  Procedure: OPEN REDUCTION INTERNAL FIXATION (ORIF) ANKLE FRACTURE;  Surgeon: Yolonda Kida, MD;  Location: [facility];  Service: Orthopedics;  Laterality: Left;  PROSTATE SURGERY    RADIOLOGY WITH ANESTHESIA N/A 06/05/2023  Procedure: IR WITH ANESTHESIA;  Surgeon: Radiologist, Medication, MD;  Location: [facility];  Service: Radiology;  Laterality: N/A; [place], Riley Nearing 06/07/2023, 12:15 PM  CT HEAD WO CONTRAST ( ) Result Date: 06/07/2023 CLINICAL DATA:  83 year old male status post code stroke presentation, left MCA M2 occlusion, with left MCA infarct with confluent petechial hemorrhage. EXAM: CT HEAD WITHOUT CONTRAST TECHNIQUE: Contiguous axial images were obtained from the base of the skull through the vertex without intravenous contrast. RADIATION DOSE REDUCTION: This exam was performed according to the departmental dose-optimization program which includes automated exposure control, adjustment of the mA and/or kV according to patient size and/or use of iterative reconstruction technique. COMPARISON:  Brain MRI yesterday.  Head CT 06/05/2023. FINDINGS: Brain: Confluent mixed density infarct in the left MCA middle division, epicenter at the insula and operculum. Confluent petechial hemorrhage on series 3, image 16 appears stable from the MRI. Superimposed trace subarachnoid blood is difficult to exclude by CT (series 3, image 13), but was not apparent on MRI. Stable mild mass effect on the left lateral ventricle with only trace rightward midline shift (series 3, image 16). No ventriculomegaly. Elsewhere gray-white differentiation is stable from the presentation CT. Basilar cisterns remain patent. Vascular: Resolved hyperdense left MCA since presentation. Calcified atherosclerosis at the skull base. Skull: Stable, intact. Sinuses/Orbits: Visualized paranasal sinuses and mastoids are stable and well aerated. Other: Left nasoenteric tube now in place. Stable orbit and scalp soft tissues. IMPRESSION: 1. Stable by CT confluent Left MCA middle division infarct with petechial hemorrhage (Heidelberg classification 1b: HI2, confluent petechiae, no mass effect). Questionable trace superimposed SAH, but was not apparent on MRI. 2. Stable minimal intracranial mass effect. 3. No new intracranial abnormality. Electronically Signed   By: Odessa Fleming M.D.   On: 06/07/2023 05:37   MR  BRAIN WO CONTRAST Result Date: 06/06/2023 CLINICAL DATA:  Follow-up examination for stroke. EXAM: MRI HEAD WITHOUT CONTRAST TECHNIQUE: Multiplanar, multiecho pulse sequences of the brain and surrounding structures were obtained without intravenous contrast. COMPARISON:  Comparison made to multiple previous exams from 06/05/2023. FINDINGS: Brain: Examination degraded by motion artifact. Cerebral volume within normal limits for age. Patchy T2/FLAIR hyperintensity involving the periventricular deep white matter both cerebral hemispheres, consistent with chronic small vessel ischemic disease, mild in nature. Small remote infarct noted within the right cerebellum. Confluent restricted diffusion involving the left insula and left frontal operculum, consistent with evolving acute left MCA distribution infarct. Area of infarction measures up to approximately 6 cm in AP diameter. Prominent associated susceptibility artifact, consistent with petechial hemorrhage (series 7, image 61) ( Heidelberg classification 1b: HI2, confluent petechiae, no mass effect. No frank or organized hematoma evident by MRI. No significant regional mass effect. Few additional small foci of infarction noted within the left parieto-occipital region posteriorly (series 3, images 40, 29). Apparent diffusion signal along the right frontal parafalcine region felt to be consistent with artifact related to dural calcification. Note made of a few additional chronic micro hemorrhages within the left cerebellum and right thalamus. No mass lesion or midline shift. Ventricles normal size without hydrocephalus.  No extra-axial fluid collection. Pituitary gland suprasellar region within normal limits. Vascular: Major intracranial vascular flow voids are maintained. Skull and upper cervical spine: Craniocervical junction within normal limits. Bone marrow signal intensity overall within normal limits. No scalp soft tissue abnormality. Sinuses/Orbits: Prior bilateral  ocular lens replacement. Paranasal sinuses are clear. No mastoid effusion. Other: None. IMPRESSION: 1. Evolving acute left MCA distribution infarct, most pronounced at the left insula and left frontal operculum. Prominent associated petechial hemorrhage without frank intraparenchymal hematoma (Heidelberg classification 1b: HI2, confluent petechiae, no mass effect). 2. Few additional small foci of acute infarction within the left parieto-occipital region posteriorly. 3. Underlying mild chronic microvascular ischemic disease with small remote right cerebellar infarct. Electronically Signed   By: Rise Mu M.D.   On: 06/06/2023 03:12   ECHOCARDIOGRAM COMPLETE Result Date: 06/05/2023    ECHOCARDIOGRAM REPORT   Patient Name:   Ayush Boulet. Date of Exam: 06/05/2023 Medical Rec #:  [id number]              Height:       70.0 in Accession #:    [id number]             Weight:       292.3 lb Date of Birth:  [birth year]              BSA:          2.453 m Patient Age:    82 years               BP:           129/72 mmHg Patient Gender: M                      HR:           89 bpm. Exam Location:  Inpatient Procedure: 2D Echo, Color Doppler, Cardiac Doppler and Intracardiac            Opacification Agent Indications:    Stroke I63.9  History:        Patient has prior history of Echocardiogram examinations, most                 recent 10/06/2019. Risk Factors:Diabetes, Hypertension and                 Dyslipidemia.  Sonographer:    Harriette Bouillon RDCS Referring Phys: Lynnae January IMPRESSIONS  1. Left ventricular ejection fraction, by estimation, is 30%. The left ventricle has moderately decreased function. The left ventricle demonstrates global hypokinesis. There is mild left ventricular hypertrophy.  2. Right ventricular systolic function is normal. The right ventricular size is normal.  3. Trivial mitral valve regurgitation.  4. The aortic valve is tricuspid. Aortic valve regurgitation is not visualized.  5. The  inferior vena cava is normal in size with greater than 50% respiratory variability, suggesting right atrial pressure of 3 mmHg. Comparison(s): The left ventricular function is worsened. FINDINGS  Left Ventricle: Left ventricular ejection fraction, by estimation, is 30%. The left ventricle has moderately decreased function. The left ventricle demonstrates global hypokinesis. Definity contrast agent was given IV to delineate the left ventricular endocardial borders. The left ventricular internal cavity size was normal in size. There is mild left ventricular hypertrophy. Right Ventricle: The right ventricular size is normal. Right vetricular wall thickness was not assessed. Right ventricular systolic function is normal. Left Atrium: Left atrial size was normal in size. Right Atrium: Right atrial size was  normal in size. Pericardium: There is no evidence of pericardial effusion. Mitral Valve: There is mild thickening of the mitral valve leaflet(s). Mild to moderate mitral annular calcification. Trivial mitral valve regurgitation. Tricuspid Valve: The tricuspid valve is normal in structure. Tricuspid valve regurgitation is trivial. Aortic Valve: The aortic valve is tricuspid. Aortic valve regurgitation is not visualized. Pulmonic Valve: The pulmonic valve was normal in structure. Pulmonic valve regurgitation is not visualized. Aorta: The aortic root and ascending aorta are structurally normal, with no evidence of dilitation. Venous: The inferior vena cava is normal in size with greater than 50% respiratory variability, suggesting right atrial pressure of 3 mmHg. IAS/Shunts: The interatrial septum was not assessed.  LEFT VENTRICLE PLAX 2D LVIDd:         5.00 cm   Diastology LVIDs:         4.40 cm   LV e' lateral: 5.33 cm/s LV PW:         1.20 cm LV IVS:        1.10 cm LVOT diam:     2.30 cm LV SV:         55 LV SV Index:   23 LVOT Area:     4.15 cm  RIGHT VENTRICLE RV S prime:     14.40 cm/s LEFT ATRIUM         Index LA  diam:    4.60 cm 1.88 cm/m  AORTIC VALVE LVOT Vmax:   65.50 cm/s LVOT Vmean:  45.200 cm/s LVOT VTI:    0.133 m  AORTA Ao Root diam: 3.00 cm Ao Asc diam:  3.50 cm  SHUNTS Systemic VTI:  0.13 m Systemic Diam: 2.30 cm Dietrich Pates MD Electronically signed by Dietrich Pates MD Signature Date/Time: 06/05/2023/9:33:38 PM    Final    DG Abd Portable 1V Result Date: 06/05/2023 CLINICAL DATA:  Feeding tube placement. EXAM: PORTABLE ABDOMEN - 1 VIEW COMPARISON:  None Available. FINDINGS: Distal tip of feeding tube is seen in expected position of distal stomach. IMPRESSION: Distal tip of feeding tube seen in expected position of distal stomach. Electronically Signed   By: Lupita Raider M.D.   On: 06/05/2023 15:49   CT HEAD WO CONTRAST Result Date: 06/05/2023 CLINICAL DATA:  Stroke, follow-up. Status post intracranial mechanical thrombectomy and stenting of the left ICA bifurcation. EXAM: CT HEAD WITHOUT CONTRAST TECHNIQUE: Contiguous axial images were obtained from the base of the skull through the vertex without intravenous contrast. RADIATION DOSE REDUCTION: This exam was performed according to the departmental dose-optimization program which includes automated exposure control, adjustment of the mA and/or kV according to patient size and/or use of iterative reconstruction technique. COMPARISON:  CT head without contrast and CT angio head and neck 06/05/2023. By plain CT 06/05/2023. FINDINGS: Brain: The study is mildly degraded by patient motion. The left insular and left opercular infarct is somewhat obscured by patient motion. The cortex is slightly hyperdense, potentially reflecting reperfusion. No hemorrhage is present. Basal ganglia are intact. Subcortical white matter hypoattenuation in the high left frontal lobe is new. Mild right-sided white matter disease is stable. Basal ganglia are intact. A remote lacunar infarct is again noted in the right cerebellum. The brainstem and cerebellum are otherwise within normal  limits. Vascular: Atherosclerotic calcifications are present within the cavernous internal carotid arteries and at the normal origin of both vertebral arteries. No hyperdense vessel is present. Skull: Calvarium is intact. No focal lytic or blastic lesions are present. No significant extracranial soft tissue lesion  is present. Sinuses/Orbits: The paranasal sinuses and mastoid air cells are clear. Bilateral lens replacements are noted. Globes and orbits are otherwise unremarkable. IMPRESSION: 1. The left insular and left opercular infarct is somewhat obscured by patient motion. 2. The cortex is slightly hyperdense, potentially reflecting reperfusion. 3. No hemorrhage. 4. Subcortical white matter hypoattenuation in the high left frontal lobe is new. This may reflect ischemic changes or edema related to the reperfusion. 5. Remote lacunar infarct of the right cerebellum. 6. Stable mild right-sided white matter disease. This likely reflects the sequela of chronic microvascular ischemia. Electronically Signed   By: Marin Roberts M.D.   On: 06/05/2023 14:38   IR PERCUTANEOUS ART THROMBECTOMY/INFUSION INTRACRANIAL INC DIAG ANGIO Result Date: 06/05/2023 INDICATION: 83 year old male presenting with right-sided weakness and aphasia; NIHSS 28. His last known well was 10 p.m. on 06/04/2023. His past medical history significant for prior stroke, hypertension, diabetes and chronic kidney disease; baseline modified Rankin scale 0. Head CT showed hypodensity within the left insula, basal ganglia and left frontal operculum (ASPECTS 7). No IV thrombolytic given as patient was outside the window. CT angiogram of the head and neck showed an occlusion of a left M2/MCA anterior division branch. CT perfusion showed a 41 mL core infarct with a 45 mL ischemic penumbra. She was transferred to our service for mechanical thrombectomy. EXAM: ULTRASOUND-GUIDED VASCULAR ACCESS DIAGNOSTIC CEREBRAL ANGIOGRAM MECHANICAL THROMBECTOMY FLAT  PANEL HEAD CT LEFT CAROTID STENTING AND ANGIOPLASTY WITHOUT CEREBRAL PROTECTION DEVICE COMPARISON:  CT/CT angiogram of the head and neck June 05, 2023. MEDICATIONS: No antibiotics administered. ANESTHESIA/SEDATION: The procedure was performed under general anesthesia. CONTRAST:  80 mL of Omnipaque 300 milligram/mL FLUOROSCOPY: Radiation Exposure Index (as provided by the fluoroscopic device): 1315 mGy Kerma COMPLICATIONS: None immediate. TECHNIQUE: Informed written consent was obtained from the patient's wife after a thorough discussion of the procedural risks, benefits and alternatives. All questions were addressed. Maximal Sterile Barrier Technique was utilized including caps, mask, sterile gowns, sterile gloves, sterile drape, hand hygiene and skin antiseptic. A timeout was performed prior to the initiation of the procedure. The right groin was prepped and draped in the usual sterile fashion. Using a micropuncture kit and the modified Seldinger technique, access was gained to the right common femoral artery and an 8 French sheath was placed. Real-time ultrasound guidance was utilized for vascular access including the acquisition of a permanent ultrasound image documenting patency of the accessed vessel. Under fluoroscopy, an 8 [place] Walrus balloon guide catheter was navigated over a 6 [place] VTK catheter and a 0.035" Terumo Glidewire into the aortic arch. The catheter was placed into the left common carotid artery and then advanced into the left internal carotid artery. The diagnostic catheter was removed. Frontal and lateral angiograms of the head were obtained. FINDINGS: 1. Ultrasound showed heavily calcified right common femoral artery is maintained patency and caliber. 2. Proximal occlusion of a left M2/MCA anterior division branch. 3. Atherosclerotic changes of the intracranial left ICA with mild stenosis at the distal cavernous segment. 4. 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 PROCEDURE: Using biplane roadmap guidance, a Red 62 aspiration catheter was navigated over Colossus 35 microguidewire into the cavernous segment of the left ICA. The aspiration catheter was then advanced to the level of occlusion and connected to an aspiration pump. Continuous aspiration was performed for 2 minutes. The guide catheter was connected to a VacLok syringe and the  guiding catheter balloon was inflated. The aspiration catheter was subsequently removed under constant aspiration. The guide catheter was aspirated for debris. Left internal carotid artery angiograms with frontal and lateral views of the head showed complete recanalization of the left MCA vascular tree. The guide catheter was retracted into the neck. Frontal and lateral angiograms of the neck were obtained. Improvement of the degree of stenosis in the carotid bulb compared to prior CT angiogram. There is prominent luminal irregularity at the carotid bulb residual moderate stenosis. Increased tortuosity of the proximal/mid cervical left ICA with kinking. Left internal carotid artery angiograms with left anterior oblique views of the neck showed evidence of filling defects at the level of the carotid bulb. Flat panel CT of the head was obtained and post processed in a separate workstation with concurrent attending physician supervision. Selected images were sent to PACS. No evidence of hemorrhagic complication. There is mild contrast staining of the left insular and frontal cortex. Repeat left internal carotid artery angiograms with frontal and lateral views of the head showed Amy seen left M3/MCA branch to the left parietal region. Left common carotid artery angiograms with frontal and lateral views of the neck showed vertebra Gretchen of stenosis at the left ICA bulb with more prominent filling defect. At this point, patient was loaded on cangrelor followed by  continuous drip. Using biplane roadmap guidance, a 4-7 mm Emboshield NAV6 cerebral protection device was advanced into the cervical left ICA. However, multiple attempts to advance a cerebral protection device through the left ICA kinking proved unsuccessful. The cerebral protection device was subsequently removed. Using biplane roadmap guidance, a 10-8 x 40 mm XACT carotid stent was navigated and deployed from the distal left common carotid artery to the proximal left internal carotid artery, proximal to the vessel kinking. Suboptimal stent expansion was noted. Then, a 6 x 30 mm Viatrac balloon was navigated into the recently deployed stent. Angioplasty was performed under fluoroscopy. Left internal carotid artery angiograms with frontal and lateral views of the neck showed adequate stent positioning and expansion with brisk anterograde flow. Left internal carotid artery angiograms with frontal and lateral views of the head showed improvement of anterograde flow in the left MCA vascular tree with brisk anterograde flow. Delayed left common carotid artery angiograms with frontal and lateral views of the neck showed no evidence of clot formation within the stent. The catheter was subsequently Riddle. Right common femoral artery angiogram was obtained in right anterior oblique view. The puncture is at the level of the common femoral artery. The artery has normal caliber, adequate for closure device. The sheath was exchanged over the wire for an 8 [place] Angio-Seal which was utilized for access closure. Immediate hemostasis was achieved. IMPRESSION: 1. Successful mechanical thrombectomy for treatment of a proximal left M2/MCA anterior division branch occlusion achieving complete recanalization (TICI 3). 2. 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 PLAN: Continue cangrelor  infusion until patient is transitioned to oral dual antiplatelet therapy. Electronically Signed   By: Baldemar Lenis M.D.   On: 06/05/2023 13:16   IR US Guide Vasc Access Right Result Date: 06/05/2023 INDICATION: 84 year old male presenting with right-sided weakness and aphasia; NIHSS 28. His last known well was 10 p.m. on 06/04/2023. His past medical history significant for prior stroke, hypertension, diabetes and chronic kidney disease; baseline modified Rankin scale 0. Head CT showed hypodensity within the left  insula, basal ganglia and left frontal operculum (ASPECTS 7). No IV thrombolytic given as patient was outside the window. CT angiogram of the head and neck showed an occlusion of a left M2/MCA anterior division branch. CT perfusion showed a 41 mL core infarct with a 45 mL ischemic penumbra. She was transferred to our service for mechanical thrombectomy. EXAM: ULTRASOUND-GUIDED VASCULAR ACCESS DIAGNOSTIC CEREBRAL ANGIOGRAM MECHANICAL THROMBECTOMY FLAT PANEL HEAD CT LEFT CAROTID STENTING AND ANGIOPLASTY WITHOUT CEREBRAL PROTECTION DEVICE COMPARISON:  CT/CT angiogram of the head and neck June 05, 2023. MEDICATIONS: No antibiotics administered. ANESTHESIA/SEDATION: The procedure was performed under general anesthesia. CONTRAST:  80 mL of Omnipaque 300 milligram/mL FLUOROSCOPY: Radiation Exposure Index (as provided by the fluoroscopic device): 1315 mGy Kerma COMPLICATIONS: None immediate. TECHNIQUE: Informed written consent was obtained from the patient's wife after a thorough discussion of the procedural risks, benefits and alternatives. All questions were addressed. Maximal Sterile Barrier Technique was utilized including caps, mask, sterile gowns, sterile gloves, sterile drape, hand hygiene and skin antiseptic. A timeout was performed prior to the initiation of the procedure. The right groin was prepped and draped in the usual sterile fashion. Using a micropuncture kit and the modified  Seldinger technique, access was gained to the right common femoral artery and an 8 French sheath was placed. Real-time ultrasound guidance was utilized for vascular access including the acquisition of a permanent ultrasound image documenting patency of the accessed vessel. Under fluoroscopy, an 8 [place] Walrus balloon guide catheter was navigated over a 6 [place] VTK catheter and a 0.035" Terumo Glidewire into the aortic arch. The catheter was placed into the left common carotid artery and then advanced into the left internal carotid artery. The diagnostic catheter was removed. Frontal and lateral angiograms of the head were obtained. FINDINGS: 1. Ultrasound showed heavily calcified right common femoral artery is maintained patency and caliber. 2. Proximal occlusion of a left M2/MCA anterior division branch. 3. Atherosclerotic changes of the intracranial left ICA with mild stenosis at the distal cavernous segment. 4. 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 PROCEDURE: Using biplane roadmap guidance, a Red 62 aspiration catheter was navigated over Colossus 35 microguidewire into the cavernous segment of the left ICA. The aspiration catheter was then advanced to the level of occlusion and connected to an aspiration pump. Continuous aspiration was performed for 2 minutes. The guide catheter was connected to a VacLok syringe and the guiding catheter balloon was inflated. The aspiration catheter was subsequently removed under constant aspiration. The guide catheter was aspirated for debris. Left internal carotid artery angiograms with frontal and lateral views of the head showed complete recanalization of the left MCA vascular tree. The guide catheter was retracted into the neck. Frontal and lateral angiograms of the neck were obtained. Improvement of the degree of stenosis in the carotid bulb compared to prior CT  angiogram. There is prominent luminal irregularity at the carotid bulb residual moderate stenosis. Increased tortuosity of the proximal/mid cervical left ICA with kinking. Left internal carotid artery angiograms with left anterior oblique views of the neck showed evidence of filling defects at the level of the carotid bulb. Flat panel CT of the head was obtained and post processed in a separate workstation with concurrent attending physician supervision. Selected images were sent to PACS. No evidence of hemorrhagic complication. There is mild contrast staining of the left insular and frontal cortex. Repeat left internal carotid artery angiograms  with frontal and lateral views of the head showed Amy seen left M3/MCA branch to the left parietal region. Left common carotid artery angiograms with frontal and lateral views of the neck showed vertebra Gretchen of stenosis at the left ICA bulb with more prominent filling defect. At this point, patient was loaded on cangrelor followed by continuous drip. Using biplane roadmap guidance, a 4-7 mm Emboshield NAV6 cerebral protection device was advanced into the cervical left ICA. However, multiple attempts to advance a cerebral protection device through the left ICA kinking proved unsuccessful. The cerebral protection device was subsequently removed. Using biplane roadmap guidance, a 10-8 x 40 mm XACT carotid stent was navigated and deployed from the distal left common carotid artery to the proximal left internal carotid artery, proximal to the vessel kinking. Suboptimal stent expansion was noted. Then, a 6 x 30 mm Viatrac balloon was navigated into the recently deployed stent. Angioplasty was performed under fluoroscopy. Left internal carotid artery angiograms with frontal and lateral views of the neck showed adequate stent positioning and expansion with brisk anterograde flow. Left internal carotid artery angiograms with frontal and lateral views of the head showed  improvement of anterograde flow in the left MCA vascular tree with brisk anterograde flow. Delayed left common carotid artery angiograms with frontal and lateral views of the neck showed no evidence of clot formation within the stent. The catheter was subsequently Riddle. Right common femoral artery angiogram was obtained in right anterior oblique view. The puncture is at the level of the common femoral artery. The artery has normal caliber, adequate for closure device. The sheath was exchanged over the wire for an 8 [place] Angio-Seal which was utilized for access closure. Immediate hemostasis was achieved. IMPRESSION: 1. Successful mechanical thrombectomy for treatment of a proximal left M2/MCA anterior division branch occlusion achieving complete recanalization (TICI 3). 2. 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 PLAN: Continue cangrelor infusion until patient is transitioned to oral dual antiplatelet therapy. Electronically Signed   By: Baldemar Lenis M.D.   On: 06/05/2023 13:16   IR INTRAVSC STENT CERV CAROTID W/O EMB-PROT MOD SED Result Date: 06/05/2023 INDICATION: 83 year old male presenting with right-sided weakness and aphasia; NIHSS 28. His last known well was 10 p.m. on 06/04/2023. His past medical history significant for prior stroke, hypertension, diabetes and chronic kidney disease; baseline modified Rankin scale 0. Head CT showed hypodensity within the left insula, basal ganglia and left frontal operculum (ASPECTS 7). No IV thrombolytic given as patient was outside the window. CT angiogram of the head and neck showed an occlusion of a left M2/MCA anterior division branch. CT perfusion showed a 41 mL core infarct with a 45 mL ischemic penumbra. She was transferred to our service for mechanical thrombectomy. EXAM: ULTRASOUND-GUIDED VASCULAR  ACCESS DIAGNOSTIC CEREBRAL ANGIOGRAM MECHANICAL THROMBECTOMY FLAT PANEL HEAD CT LEFT CAROTID STENTING AND ANGIOPLASTY WITHOUT CEREBRAL PROTECTION DEVICE COMPARISON:  CT/CT angiogram of the head and neck June 05, 2023. MEDICATIONS: No antibiotics administered. ANESTHESIA/SEDATION: The procedure was performed under general anesthesia. CONTRAST:  80 mL of Omnipaque 300 milligram/mL FLUOROSCOPY: Radiation Exposure Index (as provided by the fluoroscopic device): 1315 mGy Kerma COMPLICATIONS: None immediate. TECHNIQUE: Informed written consent was obtained from the patient's wife after a thorough discussion of the procedural risks, benefits and alternatives. All questions were addressed. Maximal Sterile Barrier Technique was utilized including caps, mask, sterile gowns, sterile gloves, sterile  drape, hand hygiene and skin antiseptic. A timeout was performed prior to the initiation of the procedure. The right groin was prepped and draped in the usual sterile fashion. Using a micropuncture kit and the modified Seldinger technique, access was gained to the right common femoral artery and an 8 French sheath was placed. Real-time ultrasound guidance was utilized for vascular access including the acquisition of a permanent ultrasound image documenting patency of the accessed vessel. Under fluoroscopy, an 8 [place] Walrus balloon guide catheter was navigated over a 6 [place] VTK catheter and a 0.035" Terumo Glidewire into the aortic arch. The catheter was placed into the left common carotid artery and then advanced into the left internal carotid artery. The diagnostic catheter was removed. Frontal and lateral angiograms of the head were obtained. FINDINGS: 1. Ultrasound showed heavily calcified right common femoral artery is maintained patency and caliber. 2. Proximal occlusion of a left M2/MCA anterior division branch. 3. Atherosclerotic changes of the intracranial left ICA with mild stenosis at the distal cavernous segment.  4. 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 PROCEDURE: Using biplane roadmap guidance, a Red 62 aspiration catheter was navigated over Colossus 35 microguidewire into the cavernous segment of the left ICA. The aspiration catheter was then advanced to the level of occlusion and connected to an aspiration pump. Continuous aspiration was performed for 2 minutes. The guide catheter was connected to a VacLok syringe and the guiding catheter balloon was inflated. The aspiration catheter was subsequently removed under constant aspiration. The guide catheter was aspirated for debris. Left internal carotid artery angiograms with frontal and lateral views of the head showed complete recanalization of the left MCA vascular tree. The guide catheter was retracted into the neck. Frontal and lateral angiograms of the neck were obtained. Improvement of the degree of stenosis in the carotid bulb compared to prior CT angiogram. There is prominent luminal irregularity at the carotid bulb residual moderate stenosis. Increased tortuosity of the proximal/mid cervical left ICA with kinking. Left internal carotid artery angiograms with left anterior oblique views of the neck showed evidence of filling defects at the level of the carotid bulb. Flat panel CT of the head was obtained and post processed in a separate workstation with concurrent attending physician supervision. Selected images were sent to PACS. No evidence of hemorrhagic complication. There is mild contrast staining of the left insular and frontal cortex. Repeat left internal carotid artery angiograms with frontal and lateral views of the head showed Amy seen left M3/MCA branch to the left parietal region. Left common carotid artery angiograms with frontal and lateral views of the neck showed vertebra Gretchen of stenosis at the left ICA bulb with more prominent filling  defect. At this point, patient was loaded on cangrelor followed by continuous drip. Using biplane roadmap guidance, a 4-7 mm Emboshield NAV6 cerebral protection device was advanced into the cervical left ICA. However, multiple attempts to advance a cerebral protection device through the left ICA kinking proved unsuccessful. The cerebral protection device was subsequently removed. Using biplane roadmap guidance, a 10-8 x 40 mm XACT carotid stent was navigated and deployed from the distal left common carotid artery to the proximal left internal carotid artery, proximal to the vessel kinking. Suboptimal stent expansion was noted. Then, a 6 x 30 mm Viatrac balloon was navigated into the recently deployed stent. Angioplasty was performed under fluoroscopy. Left internal carotid artery angiograms with frontal  and lateral views of the neck showed adequate stent positioning and expansion with brisk anterograde flow. Left internal carotid artery angiograms with frontal and lateral views of the head showed improvement of anterograde flow in the left MCA vascular tree with brisk anterograde flow. Delayed left common carotid artery angiograms with frontal and lateral views of the neck showed no evidence of clot formation within the stent. The catheter was subsequently Riddle. Right common femoral artery angiogram was obtained in right anterior oblique view. The puncture is at the level of the common femoral artery. The artery has normal caliber, adequate for closure device. The sheath was exchanged over the wire for an 8 [place] Angio-Seal which was utilized for access closure. Immediate hemostasis was achieved. IMPRESSION: 1. Successful mechanical thrombectomy for treatment of a proximal left M2/MCA anterior division branch occlusion achieving complete recanalization (TICI 3). 2. Atherosclerotic disease of the left carotid bifurcation with stenosis and clot formation suggesting acute plaque rupture treated with stenting and  angioplasty with resolution of stenosis. PLAN: Continue cangrelor infusion until patient is transitioned to oral dual antiplatelet therapy. Electronically Signed   By: Baldemar Lenis M.D.   On: 06/05/2023 13:16   IR CT Head Ltd Result Date: 06/05/2023 INDICATION: 83 year old male presenting with right-sided weakness and aphasia; NIHSS 28. His last known well was 10 p.m. on 06/04/2023. His past medical history significant for prior stroke, hypertension, diabetes and chronic kidney disease; baseline modified Rankin scale 0. Head CT showed hypodensity within the left insula, basal ganglia and left frontal operculum (ASPECTS 7). No IV thrombolytic given as patient was outside the window. CT angiogram of the head and neck showed an occlusion of a left M2/MCA anterior division branch. CT perfusion showed a 41 mL core infarct with a 45 mL ischemic penumbra. She was transferred to our service for mechanical thrombectomy. EXAM: ULTRASOUND-GUIDED VASCULAR ACCESS DIAGNOSTIC CEREBRAL ANGIOGRAM MECHANICAL THROMBECTOMY FLAT PANEL HEAD CT LEFT CAROTID STENTING AND ANGIOPLASTY WITHOUT CEREBRAL PROTECTION DEVICE COMPARISON:  CT/CT angiogram of the head and neck June 05, 2023. MEDICATIONS: No antibiotics administered. ANESTHESIA/SEDATION: The procedure was performed under general anesthesia. CONTRAST:  80 mL of Omnipaque 300 milligram/mL FLUOROSCOPY: Radiation Exposure Index (as provided by the fluoroscopic device): 1315 mGy Kerma COMPLICATIONS: None immediate. TECHNIQUE: Informed written consent was obtained from the patient's wife after a thorough discussion of the procedural risks, benefits and alternatives. All questions were addressed. Maximal Sterile Barrier Technique was utilized including caps, mask, sterile gowns, sterile gloves, sterile drape, hand hygiene and skin antiseptic. A timeout was performed prior to the initiation of the procedure. The right groin was prepped and draped in the usual sterile  fashion. Using a micropuncture kit and the modified Seldinger technique, access was gained to the right common femoral artery and an 8 French sheath was placed. Real-time ultrasound guidance was utilized for vascular access including the acquisition of a permanent ultrasound image documenting patency of the accessed vessel. Under fluoroscopy, an 8 [place] Walrus balloon guide catheter was navigated over a 6 [place] VTK catheter and a 0.035" Terumo Glidewire into the aortic arch. The catheter was placed into the left common carotid artery and then advanced into the left internal carotid artery. The diagnostic catheter was removed. Frontal and lateral angiograms of the head were obtained. FINDINGS: 1. Ultrasound showed heavily calcified right common femoral artery is maintained patency and caliber. 2. Proximal occlusion of a left M2/MCA anterior division branch. 3. Atherosclerotic changes of the intracranial left ICA with mild stenosis at the  distal cavernous segment. 4. 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 PROCEDURE: Using biplane roadmap guidance, a Red 62 aspiration catheter was navigated over Colossus 35 microguidewire into the cavernous segment of the left ICA. The aspiration catheter was then advanced to the level of occlusion and connected to an aspiration pump. Continuous aspiration was performed for 2 minutes. The guide catheter was connected to a VacLok syringe and the guiding catheter balloon was inflated. The aspiration catheter was subsequently removed under constant aspiration. The guide catheter was aspirated for debris. Left internal carotid artery angiograms with frontal and lateral views of the head showed complete recanalization of the left MCA vascular tree. The guide catheter was retracted into the neck. Frontal and lateral angiograms of the neck were obtained. Improvement of the degree of  stenosis in the carotid bulb compared to prior CT angiogram. There is prominent luminal irregularity at the carotid bulb residual moderate stenosis. Increased tortuosity of the proximal/mid cervical left ICA with kinking. Left internal carotid artery angiograms with left anterior oblique views of the neck showed evidence of filling defects at the level of the carotid bulb. Flat panel CT of the head was obtained and post processed in a separate workstation with concurrent attending physician supervision. Selected images were sent to PACS. No evidence of hemorrhagic complication. There is mild contrast staining of the left insular and frontal cortex. Repeat left internal carotid artery angiograms with frontal and lateral views of the head showed Amy seen left M3/MCA branch to the left parietal region. Left common carotid artery angiograms with frontal and lateral views of the neck showed vertebra Gretchen of stenosis at the left ICA bulb with more prominent filling defect. At this point, patient was loaded on cangrelor followed by continuous drip. Using biplane roadmap guidance, a 4-7 mm Emboshield NAV6 cerebral protection device was advanced into the cervical left ICA. However, multiple attempts to advance a cerebral protection device through the left ICA kinking proved unsuccessful. The cerebral protection device was subsequently removed. Using biplane roadmap guidance, a 10-8 x 40 mm XACT carotid stent was navigated and deployed from the distal left common carotid artery to the proximal left internal carotid artery, proximal to the vessel kinking. Suboptimal stent expansion was noted. Then, a 6 x 30 mm Viatrac balloon was navigated into the recently deployed stent. Angioplasty was performed under fluoroscopy. Left internal carotid artery angiograms with frontal and lateral views of the neck showed adequate stent positioning and expansion with brisk anterograde flow. Left internal carotid artery angiograms with  frontal and lateral views of the head showed improvement of anterograde flow in the left MCA vascular tree with brisk anterograde flow. Delayed left common carotid artery angiograms with frontal and lateral views of the neck showed no evidence of clot formation within the stent. The catheter was subsequently Riddle. Right common femoral artery angiogram was obtained in right anterior oblique view. The puncture is at the level of the common femoral artery. The artery has normal caliber, adequate for closure device. The sheath was exchanged over the wire for an 8 [place] Angio-Seal which was utilized for access closure. Immediate hemostasis was achieved. IMPRESSION: 1. Successful mechanical thrombectomy for treatment of a proximal left M2/MCA anterior division branch occlusion achieving complete recanalization (TICI 3). 2. 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  PLAN: Continue cangrelor infusion until patient is transitioned to oral dual antiplatelet therapy. Electronically Signed   By: Baldemar Lenis M.D.   On: 06/05/2023 13:16   CT ANGIO HEAD NECK W WO CM W PERF (CODE STROKE) Addendum Date: 06/05/2023 ADDENDUM REPORT: 06/05/2023 12:25 ADDENDUM: Please note, there is a dictation error within CTA neck impression #1, which should read: The common carotid and internal carotid arteries are patent within the neck. Atherosclerotic plaque bilaterally. Most notably, there is progressive atherosclerotic plaque about the left carotid bifurcation and within the proximal left ICA with resultant severe near occlusive stenosis of the proximal left ICA. Also of note, atherosclerotic plaque about the right carotid bifurcation results in a 40% stenosis at the right ICA origin. Electronically Signed   By: Jackey Loge D.O.   On: 06/05/2023 12:25   Result Date:  06/05/2023 CLINICAL DATA:  Provided history: Cerebrovascular accident, unspecified mechanism. Right-sided weakness. Right-sided facial droop. Altered mental status. EXAM: CT ANGIOGRAPHY HEAD AND NECK CT PERFUSION BRAIN TECHNIQUE: Multidetector CT imaging of the head and neck was performed using the standard protocol during bolus administration of intravenous contrast. Multiplanar CT image reconstructions and MIPs were obtained to evaluate the vascular anatomy. Carotid stenosis measurements (when applicable) are obtained utilizing NASCET criteria, using the distal internal carotid diameter as the denominator. Multiphase CT imaging of the brain was performed following IV bolus contrast injection. Subsequent parametric perfusion maps were calculated using RAPID software. RADIATION DOSE REDUCTION: This exam was performed according to the departmental dose-optimization program which includes automated exposure control, adjustment of the mA and/or kV according to patient size and/or use of iterative reconstruction technique. CONTRAST:  OMNIPAQUE IOHEXOL 350 MG/ML SOLN COMPARISON:  Noncontrast head CT performed earlier today 06/05/2023. MRA head and MRA neck 10/04/2019. FINDINGS: CTA NECK FINDINGS Aortic arch: Common origin of the innominate and left common carotid arteries. Atherosclerotic plaque within the visualized thoracic aorta and proximal major branch vessels of the neck. Streak/beam hardening artifact arising from a dense right-sided contrast bolus partially obscures the right subclavian artery. Within this limitation, there is no appreciable hemodynamically significant innominate or proximal subclavian artery stenosis. Right carotid system: CCA and ICA patent within the neck. Atherosclerotic plaque, greatest about the carotid bifurcation. Resultant 40% stenosis at the ICA origin. Left carotid system: CCA and ICA patent within the neck. Atherosclerotic plaque. Most notably, there is prominent atherosclerotic  plaque about the carotid bifurcation and within the proximal ICA which has progressed from the prior MRA neck of 10/04/2019. Resultant severe (near occlusive) stenosis of the proximal ICA. Tortuosity of the cervical ICA Vertebral arteries: The vertebral arteries are patent within the neck. Streak/beam hardening artifact limits evaluation of the right vertebral artery origin. At least moderate stenosis is suspected at this site. Atherosclerotic plaque scattered elsewhere within the cervical right vertebral artery with no more than mild stenosis. Calcified atherosclerotic plaque at the left vertebral artery origin with suspected at least moderate stenosis. Nonstenotic calcified plaque elsewhere within the cervical left vertebral artery. Skeleton: Cervical spondylosis. Other neck: No neck mass or cervical lymphadenopathy. Upper chest: No consolidation within the imaged lung apices. Review of the MIP images confirms the above findings CTA HEAD FINDINGS Anterior circulation: The intracranial internal carotid arteries are patent. As sclerotic plaque within both vessels. No more than mild stenosis on the right. Up to moderate stenosis within the left cavernous segment. The M1 middle cerebral arteries are patent. Abrupt occlusion of a proximal M2 left middle cerebral artery vessel (series  11, image 22). Atherosclerotic irregularity of the M2 and more distal MCA vessels elsewhere. The anterior cerebral arteries are patent. Atherosclerotic irregularity of both vessels without high-grade proximal stenosis. A possible 2 mm periophthalmic left ICA aneurysm with better appreciated on the prior MRA head of 10/04/2019. Posterior circulation: The intracranial vertebral arteries are patent. Atherosclerotic plaque within the right V4 segment sites of mild stenosis. Non-stenotic atherosclerotic plaque within the left V4 segment. The basilar artery is patent. The posterior cerebral arteries are patent. Posterior communicating arteries  are diminutive or absent, bilaterally. Venous sinuses: Assessment for dural venous sinus thrombosis is limited due to contrast timing. Anatomic variants: As described. Review of the MIP images confirms the above findings CT Brain Perfusion Findings: ASPECTS: CBF (<30%) Volume: 41mL Perfusion (Tmax>6.0s) volume: 86mL Mismatch Volume: 45mL Infarction Location:Left MCA vascular territory CTA head impression #1, the CT perfusion head impression and the presence of a severe stenosis of the proximal cervical left ICA called by telephone at the time of interpretation on 06/05/2023 at 8:40 am to provider [facility] , who verbally acknowledged these results. IMPRESSION: CTA neck: 1. No common carotid and internal carotid arteries are patent within the neck. Atherosclerotic plaque bilaterally. Most notably, there is progressive atherosclerotic plaque about the left carotid bifurcation and within the proximal left ICA with resultant severe, near occlusive stenosis of the proximal left ICA. Also of note, atherosclerotic plaque about the right carotid bifurcation results in 40% stenosis at the right ICA origin. 2. The vertebral arteries are patent within the neck. Atherosclerotic plaque bilaterally as described. Most notably, there is suspected at least moderate stenoses at the bilateral vertebral artery origins. 3. Aortic Atherosclerosis (ICD10-I70.0). CTA head: 1. Abrupt occlusion of a proximal M2 left middle cerebral artery vessel. 2. Background intracranial atherosclerotic disease as described. 3. A possible 2 mm periophthalmic left ICA aneurysm was better appreciated on the prior MRA head of 10/04/2019. CT perfusion head: The perfusion software identifies a 41 mL core infarct in the left MCA vascular territory. The perfusion software identifies an 86 mL region of critically hypoperfused parenchyma within the left MCA vascular territory (utilizing the Tmax>6 seconds threshold). Reported mismatch volume: 45 mL Electronically  Signed: By: Jackey Loge D.O. On: 06/05/2023 09:07   CT HEAD CODE STROKE WO CONTRAST Result Date: 06/05/2023 CLINICAL DATA:  Code stroke. Neuro deficit, acute, stroke suspected. EXAM: CT HEAD WITHOUT CONTRAST TECHNIQUE: Contiguous axial images were obtained from the base of the skull through the vertex without intravenous contrast. RADIATION DOSE REDUCTION: This exam was performed according to the departmental dose-optimization program which includes automated exposure control, adjustment of the mA and/or kV according to patient size and/or use of iterative reconstruction technique. COMPARISON:  Brain MRI 10/04/2019.  Noncontrast head CT 10/03/2019. FINDINGS: Brain: Generalized cerebral atrophy. Loss of gray-white differentiation consistent with an acute infarct within the left insula and within portions of the left frontal operculum (MCA vascular territory). Known small chronic cortically-based infarcts within the left frontal, left parietal and left occipital lobes were better appreciated on the prior brain MRI of 10/04/2019 (acute at that time). Mild patchy and ill-defined hypoattenuation within the cerebral white matter, nonspecific but compatible with chronic small vessel ischemic disease. Subcentimeter infarct within the superior right cerebellar hemisphere, new from the prior MRI but chronic in appearance. Loss of gray-white differentiation there is no acute intracranial hemorrhage. No extra-axial fluid collection. No evidence of an intracranial mass. No midline shift. Vascular: No hyperdense vessel.  Atherosclerotic calcifications. Skull: No calvarial fracture  or aggressive osseous lesion. Sinuses/Orbits: No mass or acute finding within the imaged orbits. No significant paranasal sinus disease. ASPECTS [facility] Stroke Program Early CT Score) - Ganglionic level infarction (caudate, lentiform nuclei, internal capsule, insula, M1-M3 cortex): 5 - Supraganglionic infarction (M4-M6 cortex): 2 Total score (0-10  with 10 being normal): 7 Impression #1 called by telephone at the time of interpretation on 06/05/2023 at 8:40 am to provider Dr. Otelia Limes, who verbally acknowledged these results. IMPRESSION: 1. Acute left MCA territory infarct affecting the left insula and portions of the left frontal operculum. ASPECTS is 7. 2. Known small chronic cortically-based infarcts within the left frontal, left parietal and left occipital lobes were better appreciated on the prior brain MRI of 10/04/2019 (acute at that time). 3. Background mild cerebral white matter chronic small vessel ischemic disease. 4. Subcentimeter infarct within the right cerebellar hemisphere, new from prior MRI but chronic in appearance. 5. Generalized cerebral atrophy. Electronically Signed   By: Jackey Loge D.O.   On: 06/05/2023 08:45     PHYSICAL EXAM  Temp:  [98.2 F (36.8 C)-99.6 F (37.6 C)] 98.7 F (37.1 C) (02/26 0400) Pulse Rate:  [74-125] 115 (02/26 0755) Resp:  [11-40] 20 (02/26 0755) BP: (78-142)/(39-99) 102/41 (02/26 0755) SpO2:  [88 %-99 %] 92 % (02/26 0755) FiO2 (%):  [50 %-60 %] 60 % (02/26 0755) Weight:  [132.4 kg] 132.4 kg (02/26 0500)  General -intubated elderly patient in no acute distress  Cardiovascular -regular rhythm on monitor  Neuro: Still intubated and vent dependent.  On Precedex and fentanyl.  He is unresponsive and consistent with a sedated exam.  Keeps eyes closed but will open with movement and repositioning.  With forced eye opening appears to have midline gaze.  No blink to threat on the left inconsistent on the right.  Pupils appear to be reactive bilaterally.  Cough and gag present. Motor: Withdrawal to pain on the left side.     ASSESSMENT/PLAN Mr. Kurk Corniel. is a 83 y.o. male with history of hypertension, diabetes, CKD 3, stroke admitted for right-sided weakness numbness, aphasia, right facial droop, left gaze preference. No TNK given due to outside window.  Patient underwent mechanical  thrombectomy with left ICA stenting.   2/15 in the morning patient had an episode of vomiting with coffee-ground emesis and subsequent tachycardia and tachypnea.  On evaluation this morning, he had increased work of breathing and was unable to maintain his SpO2 even on 100% O2 via nonrebreather.  Discussion was had with patient's wife and daughter, and family stated they would like to proceed with intubation and would like for patient to remain full code at this time.  CCM was consulted, and patient was transferred to ICU and intubated.  He did have an episode of aspiration during intubation and bronchoscopy was subsequently performed.  He was noted to be hypotensive after intubation, and Levophed and vasopressin were started.  Will hold Plavix for now given coffee-ground emesis.  Patient's creatinine was also noted to have worsened. Due to her worsening kidney function and need for CRRT, CCM will be primary on the patient.   Stroke:  left MCA infarct with left M2 occlusion and left ICA near occlusion s/p IR with TICI3 and left ICA stenting, likely secondary to large vessel disease source versus cardiomyopathy with low EF CT left MCA infarct CT head and neck left M2 occlusion, left ICA near occlusion, right ICA 43 stenosis, bilateral VA origin severe stenosis CTP 41/86 Status post IR  with TICI3 and left ICA stenting MRI left MCA infarct at left insular and left frontal operculum, prominent and confluent petechial hemorrhagic transformation CT repeat 2/7 stable confluent petechial hemorrhage 2D Echo EF 30% LDL 107 HgbA1c 6.1 P2 Y12 = 73 UDS negative SCDs for VTE prophylaxis aspirin 81 mg daily and clopidogrel 75 mg daily prior to admission, now on ASA and Plavix with stable hemoglobin. Ongoing aggressive stroke risk factor management Therapy recommendations:  SNF Disposition: Pending   History of stroke 10/08/2019 admitted for left MCA infarct due to right upper extremity weakness.  MRA head and  neck showed left ICA 30% stenosis.  EF 45 to 50%.  LDL 105, A1c 5.6.  Recommended loop recorder at that time but only got 30-day CardioNet monitoring which was no A-fib.  Discharged on DAPT and Lipitor 80.  Respiratory failure Leukocytosis On 2/15, patient had episode of emesis with probable subsequent aspiration and went into respiratory distress with increased work of breathing unable to maintain SpO2 After discussion with family, patient intubated for airway protection and given respiratory failure Now on precedex and fentanyl Ventilator management per CCM - failed SBT 2/19 Fever Tmax 102.5 WBC 10.1--23.5--44.5--41.4--29.9--19.5--7.4--4.9--5.7-14.3>pending cefepime-> Rocephin treatment completed. Off vancomycin  GI bleeding Acute blood loss anemia Patient had 2 episodes of coffee-ground emesis, likely stress ulcer Resume Plavix 2/18 Resume ASA 2/20  Hemoglobin 11.2-> 8.4->PRBC->8.8->8.1-> 7.6->8.3-> 8.6->7.6>8.7>8.0>pending Close monitoring  Cardiomyopathy CHF 09/2019 EF 45 to 50% Current admission EF 30% Cardiology on board, appreciate assistance On DAPT, Agree with GDMT (losartan, entresto, metoprolol and spironolactone) as BP and Cr tolerates  Diabetes HgbA1c 6.1 goal < 7.0 Hyperglycemia improved  Now off insulin drip CBG monitoring SSI  DM education and close PCP follow up  History of hypertension, now hypotensive Stable now But requiring Levophed Off epi and vasopressin Long term BP goal normotensive  Hyperlipidemia Home meds: Lipitor 80 LDL 105, goal < 70 Now on lipitor 80 and Zetia  Continue statin and zetia at discharge  Dysphagia Poststroke dysphagia Speech on board N.p.o. now OG tube reinserted Start trickle feeds 2/18  AKI on CKD  Creatinine 1.78->3.41-> 4.61->4.07-> 3.85->3.66->4.19->5.66 --> 6.92>2.35>2.5 Baseline creatinine around 1.5 Renally dose medications as appropriate Avoid contrast Aggressive treat hypotension Nephrology following  now on CCRT  Other Stroke Risk Factors Advanced age Obesity, Body mass index is 41.88 kg/m.   Other Active Problems Presyncopal episode with mild hypotension 2/14-suspect was due to straining to have a bowel movement, will prescribe as needed suppository  Hospital day # 21  Continues to require more sedation with fentanyl and Precedex due to agitation.  Unable to wean from the vent.  Kidney failure not improving despite CCRT.  Overall poor prognosis with low EF.  He is now on Levophed drip.  Will have palliative care come to discuss family again.  They appear to want full care as of now.  Continue full support.  Discussed with nephrology Dr. Thedore Mins and ICU nurse.    This patient is critically ill due to respiratory distress, left MCA stroke s/p IR intervention worsening kidney function and at significant risk of neurological worsening, death form heart failure, respiratory failure, recurrent stroke, bleeding from [facility], seizure, sepsis. This patient's care requires constant monitoring of vital signs, hemodynamics, respiratory and cardiac monitoring, review of multiple databases, neurological assessment, discussion with family, other specialists and medical decision making of high complexity. I spent 35 minutes of neurocritical care time in the care of this patient.   Knoxx Boeding,MD

## 2023-06-26 NOTE — Progress Notes (Incomplete Revision)
 Palliative:  HPI: 83 y.o. male with past medical history of HTN, HLD, diabetes, CKD stage 3b, prior CVA with no residual deficits, prostate cancer, failure to thrive admitted on 06/05/2023 with unresponsiveness with right-sided deficits found to have left MCA infarct with left M2 occlusion and left ICA near occlusion s/p IR with mechanical thrombectomy and stenting 2/5. 2/15 aspiration event resulting in severe shock requiring intubation. Hospitalization also complicated by acute GIB and CHF EF 30%.   I met today at Nathan Proctor' bedside along with daughter, Nathan Proctor. Nathan Proctor and I reviewed her father's progress and obstacles since his admission. We are able to review the concern for his kidney function. Unfortunately I believe this is a major barrier to path forward as we would like. CRRT on pause right now but concern as he was requiring increasing dosages of vasopressor support even on CRRT. We were able to discuss that CRRT is a more gentle form of dialysis only provided in ICU. We also discussed vasopressor support is also a support only provided in ICU. We reviewed that dialysis is very tough on the body for everyone and it is not an easy medical intervention to tolerate. Nathan Proctor expresses understanding. Nathan Proctor expresses that their prayer will be for his kidneys to heal and function on their own.   Nathan Proctor and I reviewed her family's faith and hopes. She shares that they are a family of positivity. They believe in prayer. We discussed having faith and trust in God to care for her father as her father's path is already written for him. I encouraged family to continue to pray and continue to support each other through this journey. We acknowledged the difficulty in seeing her father in this state and the difficulty in the uncertainty of what is to come. She reports that her mother is unable to come to bedside today and is taking time to think and process at home today.   Update: I left voicemail with daughter, Nathan Proctor  (whom I have interacted with previously). I left HIPAA compliant voicemail requesting that we can sit down and speak tomorrow. Will await return call and/or follow up again tomorrow.   Update: I returned to bedside when wife arrived. I spent time reviewing with wife Nathan Proctor' changes and obstacles since we last spoke. We discussed the struggles of dialysis and poor tolerance of gentle CRRT and how his blood pressure is restricting the efficacy of his dialysis treatment. I expressed to Nathan Proctor that this puts [place] in a much different place. Unfortunately there are many decisions to be made and consider. Nathan Proctor expresses her desire to make the right decision for her husband. She is trying to process to make good, thoughtful decisions for him. She is trying to process the information from the medical team and marry this with her hope and faith. We reviewed plan for her to spend some time with her husband this evening and plans for [place] to meet tomorrow to talk through paths from here - she will check with her daughter, Nathan Proctor, to see if she can participate in conversation in person or via phone with [place].   All questions/concerns addressed to the best of my ability. Emotional support provided.   Exam: Awakens and eyes open to voice. Follows some simple commands. Tachycardic. Tolerating vent via ETT - no distress. Abd soft. Toes cool to touch.   Plan: - Ongoing palliative support and family conversations - I am very concerned about his progress since I last visited 2/20

## 2023-06-26 NOTE — Progress Notes (Signed)
 NAME:  Nathan Proctor., MRN:  [id number], DOB:  11-Aug-1940, LOS: 21 ADMISSION DATE:  06/05/2023 CONSULTATION DATE:  06/05/2023 REFERRING MD:  Otelia Limes - Neuro, CHIEF COMPLAINT: Code Stroke, post-NIR   History of Present Illness:  83 year old man who presented to [facility] ED 2/5 via EMS for unresponsiveness and R-sided deficits. PMHx significant for HTN, HLD, prior CVA without residual deficits, T2DM, CKD stage IIIb, prostate CA, gout.  Patient presented to [facility] ED with significant R-sided deficits, aphasia and poor responsiveness. LKW 2200. Code Stroke initiated. On ED arrival, patient was afebrile with HR 86, BP 154/67, RR 20, SpO2 100%. Noted aphasia, R-sided hemiplegia, R-sided facial droop. Labs were notable for WBC 6.8. Hgb 11.9, Plt 181. INR 1.0. Na 142, K 3.8, CO2 20, Cr 1.59 (baseline), LFTs WNL. Ethanol < 10. COVID negative. CT Head with acute L MCA territory infarct affecting L insula/L frontal operculum, small chronic cortically-based infarcts of L frontal/L parietal/L occipital lobes. CTA Head/Neck with abrupt occlusion of proximal M2 L MCA vessel, ?2mm periophthalmic L ICA aneurysm, bilateral ICA plaque with progressive atherosclerotic plaque L carotid bifurcation with proximal L ICA severe, near-occlusive stenosis.   Taken to [facility] emergently for mechanical thrombectomy ([name]). TICI 3 revascularization achieved. L ICA stenosis and plaque rupture noted at the bulb, treated with stenting/angioplasty. Extubated post-procedure and maintained on cangrelor gtt with plan for transition to DAPT.  PCCM consulted for post-procedure medical management.  Pertinent Medical History:   Past Medical History:  Diagnosis Date   Anemia of chronic disease    Ankle fracture 02/15/2016   Cancer (HCC)    Prostate   Chronic constipation    Chronic kidney disease    stage III   Diabetes mellitus without complication (HCC)    type II    Diabetic peripheral neuropathy (HCC)    Failure to  thrive (0-17)    Fracture of left lower leg    Gout    Hyperlipidemia    Hypertension    Morbid obesity (HCC)    Unstable gait    Significant Hospital Events: Including procedures, antibiotic start and stop dates in addition to other pertinent events   2/5 - Presented to [facility] ED with poor responsiveness, aphasia, R-sided deficits. CT Head with L MCA territory infarct. CTA Head/Neck with occlusion of L MCA M2 segment and severe near-occlusive stenosis of L ICA. Taken to [facility] for MT, stent and angioplasty ([name]). PCCM consulted for post-procedure management. 2/6 transfer out of ICU 2/15 aspiration event early a.m. resulting in respiratory distress, transferred back to ICU and emergently intubated with development of severe shock requiring epi, levo, vaso. Transfused PRBC x1 for possible GI bleed 2/16: 3 pressors, weaning. Coox without cardiogenic component. Antibiotics vanc/cefepime. CT CAP with SBO  2/18: weaned off to only levo now which is likely sedation related  2/20: UOP drop overnight with ongoing poor renal function. Weaning sedation to evaluate neuro status.  2/22: started on trial of CRRT 2/23: HD catheter exchanged for longer one - flow improved. More awake by afternoon 2/24 tried to wean Precedex and pt got quite uncomfortable/agitated. Required Fentanyl infusion addition. Got hypotensive later in day, increase in NE  2/25 NE back down and almost off. Started pulling 50cc/hr from CRRT 2/26 remains on NE 11, CRRT ongoing  Interim History / Subjective:  Eyes open but does not follow commands. Has not yet tried SBT today Remains on 11 NE. CRRT ongoing, not making any urine. +6.6L net.  Objective:  Blood pressure (!) 102/55, pulse (!) 110, temperature 98.9 F (37.2 C), temperature source Axillary, resp. rate (!) 27, height 5\' 10"  (1.778 m), weight 132.4 kg, SpO2 94%.    Vent Mode: PRVC FiO2 (%):  [50 %-60 %] 60 % Set Rate:  [22 bmp] 22 bmp Vt Set:  [580 mL] 580  mL PEEP:  [8 cmH20] 8 cmH20 Plateau Pressure:  [21 cmH20-23 cmH20] 22 cmH20   Intake/Output Summary (Last 24 hours) at 06/26/2023 0843 Last data filed at 06/26/2023 0800 Gross per 24 hour  Intake 3845.1 ml  Output 3202.9 ml  Net 642.2 ml   Filed Weights   06/24/23 0500 06/25/23 0500 06/26/23 0500  Weight: (!) 137.7 kg 134.8 kg 132.4 kg   Physical Exam: General: obese elderly man.  HENT: ETT and SBFT in place.  Pulmonary: Slightly increased WOB. CTAB Cardiovascular: Tachy, regular, no M/R/G GI: abdomen protuberant. Active bowel sounds. FMS in place.  Extremities: no pitting edema  Neuro: Awake and orients to voice. Follows commands.  GU: Foley removed.   Ancillary Tests Personally Reviewed:  Hyperglycemic on tube feeds Creatinine 2.80, BUN 54 on CRRT  Assessment & Plan:Resolved Hospital Problem List   Acute hypoxemic respiratory failure due to aspiration Klebsiella pneumonia  - treated.  Ileus and swallowing dysfunction from stroke probably contributed.  Left MCA M2 occlusion and left ICA near occlusion now status post thrombectomy with complete revascularization Likely secondary to large vessel obstruction Heart failure with reduced ejection fraction ejection fraction of 30% with global hypokinesis, normal right ventricular function Hypertension Hyperlipidemia Type 2 diabetes Acute kidney injury superimposed on chronic kidney disease stage IIIb NSVT AM 2/25 Goals of care - poor prognosis for meaningful recovery and could be headed towards trach and possibly ESRD requiring HD  Plan:  - Continue full vent support. - SBT as able, has not tolerated this week due to increased WOB and sedation needs. - Minimize sedation as able, currently on 0.5 Precedex and 125 Fentanyl - Continue Seroquel, increase from 50 to 100 - Wean NE as able, remains on 11 currently in order to tolerate CRR - 250cc LR bolus given ongoing needs for intermittent Albumin dosing and intermittent sinus  tach, could be slightly intravascularly dry - Continue CRRT and pull volume as able. Unfortunately no UOP yet. Remains poor candidate for long term HD (if able to come off vasopressors) - Continue SSI - Increase TF coverage from 3 to 6 - Follow BMP, ensure K > 4, Mg > 2 - Continue ASA, Plavix, Statin - Palliative care consulted. I did discuss goals of care with family 2/26. They are not ready to make any decisions yet but I advised against trach given his ongoing encephalopathy, inability to tolerate SBT, ongoing CRRT as he would have poor prognosis for meaningful recovery and placement would be challenging.   Best Practice (right click and "Reselect all SmartList Selections" daily)   Diet/type: tubefeeds and NPO DVT prophylaxis prophylactic heparin , SCD's Pressure ulcer(s): N/A GI prophylaxis: PPI Lines: Central line and yes and it is still needed Foley:  Yes, and it is still needed Code Status:  full code Last date of multidisciplinary goals of care discussion: Continue to update patient and family daily. They are wanting full care at this time.    CC time: 35 min.   Rutherford Guys, PA - [facility] For pager details, please see AMION or use Epic chat  After 1900, please call [facility] for cross coverage needs  06/26/2023, 8:43 AM

## 2023-06-26 NOTE — Progress Notes (Signed)
 [facility] Progress Note    Assessment/ Plan:   AKI on CKD 3A: Baseline creatinine 1.8.  Likely ATN in the setting of low EF and shock requiring pressors.   -CRRT start 2/22-2/23 however poor flows/catheter issues. R/s 2/23 with new catheter (appreciate CCM's assistance) -overall poor dialysis candidate given currently clinical situation, plan was to trial 2-3 days of RRT to see if there is any improvement in mental status/able to liberate from vent. Today is day 3 and there has not been much improvement. Overall prognosis seems to be poor. Discussed with 2 daughters at the bedside alongside neurology. Recommend palliative care/GOC discussions with the rest of the family. Did explain to them that we have overall tried what we can with him and we're still not making much progress. If still wanting to proceed with full scope of care then suspect he's going to need trach and the ability to tolerate IHD/TDC placement which he is a poor candidate for. If that's the case then we're likely looking at prolonged stay/LTAC -will hold off on restarting CRRT for today. Discussed with RN that if family would like everything done then can restart otherwise can anticipate that we can restart by tomorrow AM  -Continue to monitor daily Cr, Dose meds for GFR -Monitor Daily I/Os, Daily weight  -Maintain MAP>65 for optimal renal perfusion.  -Avoid nephrotoxic medications including NSAIDs -Use synthetic opioids (Fentanyl/Dilaudid) if needed   Acute hypoxic respiratory failure: Aspiration.  Ventilatory management per primary team   Klebsiella pneumonia: Antibiotics per primary team   CVA: Status post thrombectomy.  Mental status continues to be poor.  Continue to monitor   Heart failure with reduced ejection fraction: Manage volume status as above, increasing uf goals   Uncontrolled type 2 diabetes with hyperglycemia: Management per primary team   Anemia: Multifactorial.  Transfusions per  primary team  Shock: pressor support per CCM  Discussed with ICU RN, neurology, and CCM.  Subjective:   Patient seen and examined bedside. CRRT cartridge expired therefore off. No real improvement in mental status, per report patient does intermittently opens eyes and follows commands Net neg ~1.1L   Objective:   BP 110/61   Pulse (!) 121   Temp 98.9 F (37.2 C) (Axillary)   Resp (!) 31   Ht 5\' 10"  (1.778 m)   Wt 132.4 kg   SpO2 90%   BMI 41.88 kg/m   Intake/Output Summary (Last 24 hours) at 06/26/2023 1048 Last data filed at 06/26/2023 1000 Gross per 24 hour  Intake 3698.58 ml  Output 2692.5 ml  Net 1006.08 ml   Weight change: -2.4 kg  Physical Exam: Gen: ill appearing, on vent/sedated CVS: RRR Resp: CTA B/L, intubated Abd: soft, nt/nd Ext: trace dependent edema LE Neuro: not following commands, unresp Dialysis access: right internal jugular temp HD catheter  Imaging: DG CHEST PORT 1 VIEW Result Date: 06/25/2023 CLINICAL DATA:  ETT placement EXAM: PORTABLE CHEST 1 VIEW COMPARISON:  06/24/2023 FINDINGS: Endotracheal tube terminates 7 cm above the carina. Patchy bilateral opacities, favoring bibasilar atelectasis. Small left pleural effusion. No pneumothorax. The heart is normal in size. Right IJ venous catheter terminates at the cavoatrial junction. Left IJ venous catheter terminates in the upper SVC. Enteric tube courses into the stomach. IMPRESSION: Endotracheal tube terminates 7 cm above the carina. Patchy bilateral opacities, favoring bibasilar atelectasis. Small left pleural effusion. Electronically Signed   By: Charline Bills M.D.   On: 06/25/2023 02:23   DG Chest [facility] 1 View Result  Date: 06/24/2023 CLINICAL DATA:  Intubated EXAM: PORTABLE CHEST 1 VIEW COMPARISON:  06/24/2023 FINDINGS: Single frontal view of the chest image straits endotracheal tube overlying tracheal air column, tip at the level of the thoracic inlet approximately 8.5 cm above carina. Enteric  catheter passes below diaphragm tip excluded by collimation. Bilateral internal jugular catheters are unchanged, distal margins projecting over the superior vena cava. Cardiac silhouette is stable. Stable bibasilar consolidation and bilateral pleural effusions. No pneumothorax. No acute bony abnormalities. IMPRESSION: 1. Endotracheal tube tip at the thoracic inlet, approximately 8.5 cm above carina. 2. Other support devices as above. 3. Persistent bibasilar consolidation and effusions. Electronically Signed   By: Sharlet Salina M.D.   On: 06/24/2023 22:43    Labs: BMET Recent Labs  Lab 06/23/23 0509 06/23/23 1623 06/24/23 0520 06/24/23 1553 06/24/23 2353 06/25/23 0541 06/25/23 1621 06/26/23 0720  NA 137 137 136 134* 137 135 138 137  K 3.9 4.1 4.2 4.9 4.7 5.1 5.1 4.5  CL 104 104 102 103  --  107 102 103  CO2 21* 23 24 23   --  20* 25 21*  GLUCOSE 250* 350* 252* 355*  --  199* 231* 171*  BUN 91* 79* 54* 50*  --  42* 36* 29*  CREATININE 4.23* 3.68* 2.80* 2.47*  --  2.35* 2.26* 1.97*  CALCIUM 8.3* 8.1* 8.7* 8.4*  --  7.9* 8.9 8.7*  PHOS 3.1 2.8 3.0 3.2  --  3.4 3.1 2.8   CBC Recent Labs  Lab 06/19/23 1544 06/20/23 0447 06/21/23 0602 06/24/23 2353 06/25/23 0541  WBC 4.9 5.7 7.3  --  14.3*  NEUTROABS  --   --   --   --  11.4*  HGB 8.3* 8.6* 7.6* 8.2* 8.0*  HCT 27.1* 28.0* 25.3* 24.0* 26.3*  MCV 100.0 100.7* 103.3*  --  100.8*  PLT 115* 113* 110*  --  132*    Medications:     sodium chloride   Intravenous Once   aspirin  81 mg Per Tube Daily   atorvastatin  80 mg Per Tube Daily   Chlorhexidine Gluconate Cloth  6 each Topical Daily   clopidogrel  75 mg Per Tube Daily   ezetimibe  10 mg Per Tube Daily   feeding supplement (PROSource TF20)  60 mL Per Tube BID   heparin injection (subcutaneous)  5,000 Units Subcutaneous Q8H   insulin aspart  0-20 Units Subcutaneous Q4H   insulin aspart  6 Units Subcutaneous Q4H   insulin glargine  20 Units Subcutaneous BID   leptospermum  manuka honey  1 Application Topical Daily   liver oil-zinc oxide   Topical BID   multivitamin  1 tablet Per Tube QHS   mouth rinse  15 mL Mouth Rinse Q2H   pantoprazole (PROTONIX) IV  40 mg Intravenous Q12H   QUEtiapine  100 mg Per Tube BID   sodium chloride flush  3 mL Intravenous Once      Anthony Sar, MD [facility] 06/26/2023, 10:48 AM

## 2023-06-27 ENCOUNTER — Inpatient Hospital Stay (HOSPITAL_COMMUNITY): Payer: No Typology Code available for payment source

## 2023-06-27 DIAGNOSIS — R579 Shock, unspecified: Secondary | ICD-10-CM | POA: Diagnosis not present

## 2023-06-27 DIAGNOSIS — Z515 Encounter for palliative care: Secondary | ICD-10-CM | POA: Diagnosis not present

## 2023-06-27 DIAGNOSIS — D62 Acute posthemorrhagic anemia: Secondary | ICD-10-CM

## 2023-06-27 DIAGNOSIS — R1310 Dysphagia, unspecified: Secondary | ICD-10-CM

## 2023-06-27 DIAGNOSIS — K567 Ileus, unspecified: Secondary | ICD-10-CM | POA: Diagnosis not present

## 2023-06-27 DIAGNOSIS — Z7189 Other specified counseling: Secondary | ICD-10-CM | POA: Diagnosis not present

## 2023-06-27 DIAGNOSIS — J9601 Acute respiratory failure with hypoxia: Secondary | ICD-10-CM | POA: Diagnosis not present

## 2023-06-27 DIAGNOSIS — I639 Cerebral infarction, unspecified: Secondary | ICD-10-CM | POA: Diagnosis not present

## 2023-06-27 LAB — GLUCOSE, CAPILLARY
Glucose-Capillary: 153 mg/dL — ABNORMAL HIGH (ref 70–99)
Glucose-Capillary: 209 mg/dL — ABNORMAL HIGH (ref 70–99)
Glucose-Capillary: 172 mg/dL — ABNORMAL HIGH (ref 70–99)
Glucose-Capillary: 142 mg/dL — ABNORMAL HIGH (ref 70–99)
Glucose-Capillary: 118 mg/dL — ABNORMAL HIGH (ref 70–99)
Glucose-Capillary: 88 mg/dL (ref 70–99)

## 2023-06-27 LAB — CBC
HCT: 19 % — ABNORMAL LOW (ref 39.0–52.0)
HCT: 25.6 % — ABNORMAL LOW (ref 39.0–52.0)
Hemoglobin: 5.7 g/dL — CL (ref 13.0–17.0)
Hemoglobin: 8.1 g/dL — ABNORMAL LOW (ref 13.0–17.0)
MCH: 31 pg (ref 26.0–34.0)
MCH: 30.8 pg (ref 26.0–34.0)
MCHC: 30 g/dL (ref 30.0–36.0)
MCHC: 31.6 g/dL (ref 30.0–36.0)
MCV: 103.3 fL — ABNORMAL HIGH (ref 80.0–100.0)
MCV: 97.3 fL (ref 80.0–100.0)
Platelets: 157 10*3/uL (ref 150–400)
Platelets: 166 10*3/uL (ref 150–400)
RBC: 1.84 MIL/uL — ABNORMAL LOW (ref 4.22–5.81)
RBC: 2.63 MIL/uL — ABNORMAL LOW (ref 4.22–5.81)
RDW: 14.2 % (ref 11.5–15.5)
RDW: 16.8 % — ABNORMAL HIGH (ref 11.5–15.5)
WBC: 13.3 10*3/uL — ABNORMAL HIGH (ref 4.0–10.5)
WBC: 15.2 10*3/uL — ABNORMAL HIGH (ref 4.0–10.5)
nRBC: 0 % (ref 0.0–0.2)
nRBC: 0 % (ref 0.0–0.2)

## 2023-06-27 LAB — RENAL FUNCTION PANEL
Albumin: 2.4 g/dL — ABNORMAL LOW (ref 3.5–5.0)
Albumin: 2.3 g/dL — ABNORMAL LOW (ref 3.5–5.0)
Anion gap: 10 (ref 5–15)
Anion gap: 9 (ref 5–15)
BUN: 36 mg/dL — ABNORMAL HIGH (ref 8–23)
BUN: 31 mg/dL — ABNORMAL HIGH (ref 8–23)
CO2: 24 mmol/L (ref 22–32)
CO2: 24 mmol/L (ref 22–32)
Calcium: 9 mg/dL (ref 8.9–10.3)
Calcium: 8.5 mg/dL — ABNORMAL LOW (ref 8.9–10.3)
Chloride: 102 mmol/L (ref 98–111)
Chloride: 105 mmol/L (ref 98–111)
Creatinine, Ser: 2.32 mg/dL — ABNORMAL HIGH (ref 0.61–1.24)
Creatinine, Ser: 2.03 mg/dL — ABNORMAL HIGH (ref 0.61–1.24)
GFR, Estimated: 27 mL/min — ABNORMAL LOW (ref >60)
GFR, Estimated: 32 mL/min — ABNORMAL LOW (ref >60)
Glucose, Bld: 148 mg/dL — ABNORMAL HIGH (ref 70–99)
Glucose, Bld: 132 mg/dL — ABNORMAL HIGH (ref 70–99)
Phosphorus: 2.7 mg/dL (ref 2.5–4.6)
Phosphorus: 3 mg/dL (ref 2.5–4.6)
Potassium: 5.1 mmol/L (ref 3.5–5.1)
Potassium: 4.9 mmol/L (ref 3.5–5.1)
Sodium: 136 mmol/L (ref 135–145)
Sodium: 138 mmol/L (ref 135–145)

## 2023-06-27 LAB — MAGNESIUM: Magnesium: 2.4 mg/dL (ref 1.7–2.4)

## 2023-06-27 LAB — PREPARE RBC (CROSSMATCH)

## 2023-06-27 MED ORDER — PHENYLEPHRINE HCL-NACL 20-0.9 MG/250ML-% IV SOLN
0.0000 ug/min | INTRAVENOUS | Status: DC
  Administered 2023-06-27: 150 ug/min via INTRAVENOUS
  Administered 2023-06-27: 20 ug/min via INTRAVENOUS
  Administered 2023-06-27 (×2): 120 ug/min via INTRAVENOUS
  Filled 2023-06-27 (×3): qty 250
  Filled 2023-06-27: qty 750

## 2023-06-27 MED ORDER — SODIUM CHLORIDE 0.9% IV SOLUTION: Freq: Once | INTRAVENOUS | Status: DC

## 2023-06-27 MED ORDER — PRISMASOL BGK 2/3.5 32-2-3.5 MEQ/L EC SOLN
Status: DC
Start: 2023-06-27 — End: 2023-06-29

## 2023-06-27 MED ORDER — PHENYLEPHRINE HCL-NACL 20-0.9 MG/250ML-% IV SOLN
INTRAVENOUS | Status: AC
  Filled 2023-06-27: qty 250

## 2023-06-27 MED ORDER — MIDAZOLAM BOLUS VIA INFUSION
1.0000 mg | INTRAVENOUS | Status: DC | PRN
  Administered 2023-06-28: 2 mg via INTRAVENOUS
  Administered 2023-06-28: 1 mg via INTRAVENOUS

## 2023-06-27 MED ORDER — SODIUM CHLORIDE 0.9 % IV SOLN
2.0000 g | Freq: Two times a day (BID) | INTRAVENOUS | Status: DC
  Administered 2023-06-27 – 2023-06-29 (×6): 2 g via INTRAVENOUS
  Filled 2023-06-27 (×6): qty 12.5

## 2023-06-27 MED ORDER — INSULIN GLARGINE 100 UNIT/ML ~~LOC~~ SOLN
25.0000 [IU] | Freq: Two times a day (BID) | SUBCUTANEOUS | Status: DC
  Administered 2023-06-27 – 2023-06-28 (×3): 25 [IU] via SUBCUTANEOUS
  Filled 2023-06-27 (×4): qty 0.25

## 2023-06-27 MED ORDER — VASOPRESSIN 20 UNITS/100 ML INFUSION FOR SHOCK
0.0000 [IU]/min | INTRAVENOUS | Status: DC
  Administered 2023-06-27 – 2023-07-02 (×15): 0.04 [IU]/min via INTRAVENOUS
  Administered 2023-07-02: 0.03 [IU]/min via INTRAVENOUS
  Administered 2023-07-03: 0.02 [IU]/min via INTRAVENOUS
  Filled 2023-06-27 (×16): qty 100

## 2023-06-27 MED ORDER — MIDAZOLAM-SODIUM CHLORIDE 100-0.9 MG/100ML-% IV SOLN
INTRAVENOUS | Status: AC
  Filled 2023-06-27: qty 100

## 2023-06-27 MED ORDER — SODIUM CHLORIDE 0.9% IV SOLUTION: Freq: Once | INTRAVENOUS | Status: AC

## 2023-06-27 MED ORDER — FENTANYL BOLUS VIA INFUSION
50.0000 ug | INTRAVENOUS | Status: DC | PRN
  Administered 2023-06-28 – 2023-06-29 (×3): 100 ug via INTRAVENOUS
  Administered 2023-06-29 – 2023-07-02 (×2): 50 ug via INTRAVENOUS

## 2023-06-27 MED ORDER — MIDAZOLAM-SODIUM CHLORIDE 100-0.9 MG/100ML-% IV SOLN
0.5000 mg/h | INTRAVENOUS | Status: DC
  Administered 2023-06-27: 3 mg/h via INTRAVENOUS
  Administered 2023-06-27: 0.5 mg/h via INTRAVENOUS
  Administered 2023-06-27: 2 mg/h via INTRAVENOUS
  Administered 2023-06-29 – 2023-06-30 (×3): 8 mg/h via INTRAVENOUS
  Administered 2023-06-30: 7.5 mg/h via INTRAVENOUS
  Administered 2023-07-01: 8 mg/h via INTRAVENOUS
  Administered 2023-07-02: 2 mg/h via INTRAVENOUS
  Filled 2023-06-27 (×7): qty 100

## 2023-06-27 NOTE — Progress Notes (Signed)
 eLink Physician-Brief Progress Note Patient Name: Nathan Proctor. DOB: [birth year] MRN: [id number]   Date of Service  06/27/2023  HPI/Events of Note  Non sustained VT  eICU Interventions     Labs ordered     [facility] 06/27/2023, 11:44 PM

## 2023-06-27 NOTE — Progress Notes (Signed)
 [facility] Progress Note    Assessment/ Plan:   AKI on CKD 3A: Baseline creatinine 1.8.  Likely ATN in the setting of low EF and shock requiring pressors.   -CRRT start 2/22-2/23 however poor flows/catheter issues. R/s 2/23 with new catheter (appreciate CCM's assistance) -overall poor long term HD candidate. Originial plan was to do 3 day trial of CRRT which was completed on 2/26.  However, family would like to move ahead with full scope treatment.  Palliative following, now DNR.  Will continue with CRRT in the interim per family wishes -Continue with UF goals of net -50 to 100 cc an hour -Dialysate changed to 2K, rest of bags for 4K  -Continue to monitor daily Cr, Dose meds for GFR -Monitor Daily I/Os, Daily weight  -Maintain MAP>65 for optimal renal perfusion.  -Avoid nephrotoxic medications including NSAIDs -Use synthetic opioids (Fentanyl/Dilaudid) if needed   Acute hypoxic respiratory failure: Aspiration.  Ventilatory management per primary team   Klebsiella pneumonia: Antibiotics per primary team   CVA: Status post thrombectomy.  Mental status continues to be poor.  Continue to monitor   Heart failure with reduced ejection fraction: Manage volume status as above, UF as tolerated with CRRT   Uncontrolled type 2 diabetes with hyperglycemia: Management per primary team   Anemia: Multifactorial.  Transfusions per primary team  Shock: pressor support per CCM  Discussed with ICU RN and CCM. -Discussed with wife at the bedside  Subjective:   Patient seen and examined on CRRT. R/s'ed last night. Now needing 2u prbc's, inc'd fio2 requirements. Net positive: ~2.2L Pressors: levo, vaso   Objective:   BP (!) 99/48   Pulse 92   Temp 98.6 F (37 C) (Axillary)   Resp (!) 27   Ht 5\' 10"  (1.778 m)   Wt (!) 136.1 kg   SpO2 96%   BMI 43.05 kg/m   Intake/Output Summary (Last 24 hours) at 06/27/2023 1027 Last data filed at 06/27/2023 1000 Gross per 24 hour   Intake 4257.44 ml  Output 2182.6 ml  Net 2074.84 ml   Weight change: 3.7 kg  Physical Exam: Gen: ill appearing, on vent/sedated CVS: RRR Resp: CTA B/L, intubated Abd: soft, nt/nd Ext: trace dependent edema LE Neuro: not following commands, unresp Dialysis access: right internal jugular temp HD catheter  Imaging: No results found.   Labs: BMET Recent Labs  Lab 06/24/23 0520 06/24/23 1553 06/24/23 2353 06/25/23 0541 06/25/23 1621 06/26/23 0720 06/26/23 1539 06/27/23 0453  NA 136 134* 137 135 138 137 134* 136  K 4.2 4.9 4.7 5.1 5.1 4.5 5.6* 5.1  CL 102 103  --  107 102 103 102 102  CO2 24 23  --  20* 25 21* 24 24  GLUCOSE 252* 355*  --  199* 231* 171* 294* 148*  BUN 54* 50*  --  42* 36* 29* 39* 36*  CREATININE 2.80* 2.47*  --  2.35* 2.26* 1.97* 2.57* 2.32*  CALCIUM 8.7* 8.4*  --  7.9* 8.9 8.7* 8.3* 9.0  PHOS 3.0 3.2  --  3.4 3.1 2.8 2.8 2.7   CBC Recent Labs  Lab 06/21/23 0602 06/24/23 2353 06/25/23 0541 06/27/23 0453  WBC 7.3  --  14.3* 13.3*  NEUTROABS  --   --  11.4*  --   HGB 7.6* 8.2* 8.0* 5.7*  HCT 25.3* 24.0* 26.3* 19.0*  MCV 103.3*  --  100.8* 103.3*  PLT 110*  --  132* 157    Medications:  sodium chloride   Intravenous Once   sodium chloride   Intravenous Once   aspirin  81 mg Per Tube Daily   atorvastatin  80 mg Per Tube Daily   Chlorhexidine Gluconate Cloth  6 each Topical Daily   ezetimibe  10 mg Per Tube Daily   feeding supplement (PROSource TF20)  60 mL Per Tube BID   heparin injection (subcutaneous)  5,000 Units Subcutaneous Q8H   insulin aspart  0-20 Units Subcutaneous Q4H   insulin aspart  6 Units Subcutaneous Q4H   insulin glargine  25 Units Subcutaneous BID   leptospermum manuka honey  1 Application Topical Daily   liver oil-zinc oxide   Topical BID   multivitamin  1 tablet Per Tube QHS   mouth rinse  15 mL Mouth Rinse Q2H   pantoprazole (PROTONIX) IV  40 mg Intravenous Q12H   QUEtiapine  100 mg Per Tube BID   sodium  chloride flush  3 mL Intravenous Once      Anthony Sar, MD [facility] 06/27/2023, 10:27 AM

## 2023-06-27 NOTE — Progress Notes (Signed)
 I sat and spoke with the wife after rounds this morning.  Has already been seen by palliative today and made DNR.  Patient moving in the wrong direction today with steadily increasing FiO2 requirements, increasing vasopressor requirements.  I have instructed nursing to stop pulling any fluid on CRRT.  Concerned that he may code today.  I asked the wife to call their daughters if they would like to visit.  Additionally I offered to call and update the daughters which the wife declined and stated that she would like to do it herself.  She is still hoping for a miracle and that he might turn around she would like to continue with ongoing support up until CPR.  Will continue to revisit this throughout the day.

## 2023-06-27 NOTE — Progress Notes (Signed)
 Critical hgb 5.7 called to eLink MD, awaiting orders

## 2023-06-27 NOTE — Progress Notes (Signed)
 eLink Physician-Brief Progress Note Patient Name: Nathan Proctor. DOB: 03-Jan-1941 MRN: [id number]   Date of Service  06/27/2023  HPI/Events of Note  Notified of drop in H/H from last check on 2.25. No bleeding. On CRRT.   eICU Interventions  1 unit RBC to be transfused RN to verify consent     Intervention Category Major Interventions: Hemorrhage - evaluation and management  Oretha Milch 06/27/2023, 5:46 AM

## 2023-06-27 NOTE — Progress Notes (Addendum)
 STROKE TEAM PROGRESS NOTE   SUBJECTIVE (INTERVAL HISTORY) Wife is at the bedside.  Patient remains intubated on Precedex at 0.4 mcg, fentanyl 200 mcg.  He is also on Levophed at 29 mcg, vasopressin at 0.04 also is receiving 2 units of packed red blood cells.  He is on CRRT  Neurologically there is no change in his exam he does draw on on the left side. Hemoglobin dropped today will DC Plavix  OBJECTIVE Temp:  [97.6 F (36.4 C)-101.4 F (38.6 C)] 97.6 F (36.4 C) (02/27 1248) Pulse Rate:  [77-122] 90 (02/27 1248) Cardiac Rhythm: Normal sinus rhythm;Sinus tachycardia (02/27 0800) Resp:  [13-49] 24 (02/27 1248) BP: (82-142)/(31-93) 107/63 (02/27 1248) SpO2:  [80 %-100 %] 93 % (02/27 1248) FiO2 (%):  [40 %-70 %] 70 % (02/27 1030) Weight:  [136.1 kg] 136.1 kg (02/27 0500)  Recent Labs  Lab 06/26/23 1919 06/26/23 2343 06/27/23 0328 06/27/23 0747 06/27/23 1142  GLUCAP 251* 175* 153* 209* 172*   Recent Labs  Lab 06/23/23 0509 06/23/23 1623 06/24/23 0520 06/24/23 1553 06/25/23 0541 06/25/23 1621 06/26/23 0720 06/26/23 1539 06/27/23 0453  NA 137   < > 136   < > 135 138 137 134* 136  K 3.9   < > 4.2   < > 5.1 5.1 4.5 5.6* 5.1  CL 104   < > 102   < > 107 102 103 102 102  CO2 21*   < > 24   < > 20* 25 21* 24 24  GLUCOSE 250*   < > 252*   < > 199* 231* 171* 294* 148*  BUN 91*   < > 54*   < > 42* 36* 29* 39* 36*  CREATININE 4.23*   < > 2.80*   < > 2.35* 2.26* 1.97* 2.57* 2.32*  CALCIUM 8.3*   < > 8.7*   < > 7.9* 8.9 8.7* 8.3* 9.0  MG 2.4  --  2.4  --  2.4  --  2.5*  --  2.4  PHOS 3.1   < > 3.0   < > 3.4 3.1 2.8 2.8 2.7   < > = values in this interval not displayed.   Recent Labs  Lab 06/25/23 0541 06/25/23 1621 06/26/23 0720 06/26/23 1539 06/27/23 0453  ALBUMIN 2.1* 2.3* 2.3* 2.0* 2.4*    Recent Labs  Lab 06/21/23 0602 06/24/23 2353 06/25/23 0541 06/27/23 0453  WBC 7.3  --  14.3* 13.3*  NEUTROABS  --   --  11.4*  --   HGB 7.6* 8.2* 8.0* 5.7*  HCT 25.3* 24.0*  26.3* 19.0*  MCV 103.3*  --  100.8* 103.3*  PLT 110*  --  132* 157       Component Value Date/Time   CHOL 183 06/06/2023 0557   TRIG 109 06/19/2023 0031   HDL 60 06/06/2023 0557   CHOLHDL 3.1 06/06/2023 0557   VLDL 16 06/06/2023 0557   LDLCALC 107 (H) 06/06/2023 0557   LDLCALC 223 (H) 05/31/2021 0000   Lab Results  Component Value Date   HGBA1C 6.1 (H) 06/05/2023      Component Value Date/Time   LABOPIA NONE DETECTED 06/05/2023 1653   COCAINSCRNUR NONE DETECTED 06/05/2023 1653   LABBENZ NONE DETECTED 06/05/2023 1653   AMPHETMU NONE DETECTED 06/05/2023 1653   THCU NONE DETECTED 06/05/2023 1653   LABBARB NONE DETECTED 06/05/2023 1653    No results for input(s): "ETH" in the last 168 hours.   DG CHEST PORT 1 VIEW Result  Date: 06/27/2023 CLINICAL DATA:  Tachypnea. EXAM: PORTABLE CHEST 1 VIEW COMPARISON:  June 24, 2023. FINDINGS: Stable cardiomediastinal silhouette is noted with interval development of central pulmonary vascular congestion and probable bilateral pulmonary edema. Bibasilar atelectasis is noted with probable pleural effusions, right greater than left. Endotracheal and feeding tubes are in grossly good position. Bilateral internal jugular catheters are unchanged. IMPRESSION: Interval development of central pulmonary vascular congestion and probable bilateral pulmonary edema with associated bibasilar atelectasis and pleural effusions, right greater than left. Electronically Signed   By: Lupita Raider M.D.   On: 06/27/2023 11:36   DG CHEST PORT 1 VIEW Result Date: 06/25/2023 CLINICAL DATA:  ETT placement EXAM: PORTABLE CHEST 1 VIEW COMPARISON:  06/24/2023 FINDINGS: Endotracheal tube terminates 7 cm above the carina. Patchy bilateral opacities, favoring bibasilar atelectasis. Small left pleural effusion. No pneumothorax. The heart is normal in size. Right IJ venous catheter terminates at the cavoatrial junction. Left IJ venous catheter terminates in the upper SVC.  Enteric tube courses into the stomach. IMPRESSION: Endotracheal tube terminates 7 cm above the carina. Patchy bilateral opacities, favoring bibasilar atelectasis. Small left pleural effusion. Electronically Signed   By: Charline Bills M.D.   On: 06/25/2023 02:23   DG Chest Port 1 View Result Date: 06/24/2023 CLINICAL DATA:  Intubated EXAM: PORTABLE CHEST 1 VIEW COMPARISON:  06/24/2023 FINDINGS: Single frontal view of the chest image straits endotracheal tube overlying tracheal air column, tip at the level of the thoracic inlet approximately 8.5 cm above carina. Enteric catheter passes below diaphragm tip excluded by collimation. Bilateral internal jugular catheters are unchanged, distal margins projecting over the superior vena cava. Cardiac silhouette is stable. Stable bibasilar consolidation and bilateral pleural effusions. No pneumothorax. No acute bony abnormalities. IMPRESSION: 1. Endotracheal tube tip at the thoracic inlet, approximately 8.5 cm above carina. 2. Other support devices as above. 3. Persistent bibasilar consolidation and effusions. Electronically Signed   By: Sharlet Salina M.D.   On: 06/24/2023 22:43   DG CHEST PORT 1 VIEW Result Date: 06/23/2023 CLINICAL DATA:  Central line placement EXAM: PORTABLE CHEST 1 VIEW COMPARISON:  06/22/2023 FINDINGS: RIGHT central venous line with tip in distal SVC. LEFT central venous line with tip in the mid SVC. Endotracheal tube and feeding tube unchanged. No pneumothorax. Bilateral small effusions. Low lung volumes. IMPRESSION: 1. Bilateral central venous lines without complication. 2. Low lung volumes and small effusions. Electronically Signed   By: Genevive Bi M.D.   On: 06/23/2023 10:10   DG Chest Port 1 View Result Date: 06/22/2023 CLINICAL DATA:  Central line placement. EXAM: PORTABLE CHEST 1 VIEW COMPARISON:  June 19, 2023. FINDINGS: Stable cardiomediastinal silhouette. Hypoinflation of the lungs is noted with bibasilar subsegmental  atelectasis and probable small pleural effusions. Endotracheal tube and feeding tube are unchanged. Left internal jugular catheter is unchanged. Interval placement of right internal jugular catheter with distal tip in expected position of the SVC. No pneumothorax. IMPRESSION: Interval placement of right internal jugular catheter with distal tip in expected position of the SVC. Electronically Signed   By: Lupita Raider M.D.   On: 06/22/2023 11:36   DG CHEST PORT 1 VIEW Result Date: 06/19/2023 CLINICAL DATA:  Respiratory distress. EXAM: PORTABLE CHEST 1 VIEW COMPARISON:  Radiograph 06/15/2023, CT 06/16/2023 FINDINGS: Tip of the endotracheal tube 4.5 cm from the carina. Enteric tube and left central line unchanged in positioning. Slight improved right lung aeration with persistent basilar opacity. Slight worsening patchy opacity at the left lung base. No  pneumothorax or large pleural effusion. No pulmonary edema. Stable heart size and mediastinal contours. IMPRESSION: 1. Bibasilar opacities, improving on the right but worsening on the left. 2. Stable support apparatus. Electronically Signed   By: Narda Rutherford M.D.   On: 06/19/2023 16:02   DG Abd Portable 1V Result Date: 06/18/2023 CLINICAL DATA:  Orogastric tube placement. EXAM: PORTABLE ABDOMEN - 1 VIEW COMPARISON:  Radiographs 06/05/2023.  Abdominal CT 06/16/2023. FINDINGS: 1236 hours. Tip of the enteric tube projects over the right upper quadrant of the abdomen, likely in the distal stomach. There is a small amount contrast material within the stomach. The visualized bowel gas pattern is normal. Patient is rotated to the right with persistent right basilar airspace disease and a small right pleural effusion. IMPRESSION: Enteric tube tip projects over the distal stomach. Electronically Signed   By: Carey Bullocks M.D.   On: 06/18/2023 14:48   CT CHEST ABDOMEN PELVIS WO CONTRAST Result Date: 06/16/2023 CLINICAL DATA:  Septicemia.  Pneumonia. EXAM: CT  CHEST, ABDOMEN AND PELVIS WITHOUT CONTRAST TECHNIQUE: Multidetector CT imaging of the chest, abdomen and pelvis was performed following the standard protocol without IV contrast. RADIATION DOSE REDUCTION: This exam was performed according to the departmental dose-optimization program which includes automated exposure control, adjustment of the mA and/or kV according to patient size and/or use of iterative reconstruction technique. COMPARISON:  CT angio chest from 04/04/2016. FINDINGS: CT CHEST FINDINGS Cardiovascular: Heart size is normal. Aortic atherosclerosis and multi vessel coronary artery calcifications. No pericardial effusion. Mediastinum/Nodes: Thyroid gland, trachea and esophagus appear normal. ET tube is in place with tip above the carina. There is a enteric tube with tip in the stomach.No enlarged mediastinal lymph nodes. Hilar lymph nodes are suboptimally evaluated due to lack of IV contrast. Lungs/Pleura: No pleural effusion or interstitial edema. Bilateral lower lobe airspace consolidation is identified, right greater than left. Imaging findings compatible with multifocal pneumonia. Chronic subsegmental atelectasis this is within the right middle lobe and right lower lobe with asymmetric volume loss and elevation of the right hemidiaphragm. Musculoskeletal: No chest wall mass or suspicious bone lesions identified. CT ABDOMEN PELVIS FINDINGS Hepatobiliary: No focal liver abnormality. Gallbladder appears normal. No signs of bile duct dilatation. Pancreas: Unremarkable. No pancreatic ductal dilatation or surrounding inflammatory changes. Spleen: Normal in size without focal abnormality. Adrenals/Urinary Tract: Normal adrenal glands. Multiple stones identified within the upper pole of the left kidney which measure up to 7 mm. Bilateral exophytic kidney lesions of varying complexity are identified. These are suboptimally assessed on the current exam reflecting lack of IV contrast material. No signs of  hydronephrosis. Urinary bladder is decompressed around a Foley catheter. Stomach/Bowel: Enteric tube tip is in the proximal stomach. There is retained enteric contrast material within the gastric fundus. The appendix is visualized and appears normal. There are several dilated loops of small bowel within the right hemiabdomen. The proximal small bowel loops are normal in caliber. Within the right upper quadrant of the abdomen there are multiple dilated loops of small bowel measuring up to 3.5 cm containing air-fluid levels. The bowel loops proximal and distal are normal in caliber. Within the limitations of unenhanced technique there is no significant bowel wall thickening. Distal colonic diverticulosis identified without signs of acute diverticulitis. Retained enteric contrast material identified within the colon up to the rectum Vascular/Lymphatic: Aortic atherosclerosis without aneurysm. No signs of abdominopelvic adenopathy. Reproductive: Prostate gland is either atrophic or surgically absent. Penile prosthesis identified. Other: No free fluid or fluid collections. Musculoskeletal:  No acute or significant osseous findings. IMPRESSION: 1. Bilateral lower lobe airspace consolidation, right greater than left. Imaging findings compatible with multifocal pneumonia. 2. Chronic subsegmental atelectasis within the right middle lobe and right lower lobe with asymmetric volume loss and elevation of the right hemidiaphragm. 3. Assessment of bowel pathology is limited due to lack of IV and enteric contrast material. There are multiple abnormally dilated loops of small bowel within the right hemiabdomen. Findings are compatible with a small-bowel obstruction. As there is normal caliber proximal small bowel and normal caliber distal small bowel findings are concerning for either close loop obstruction or internal hernia. 4. Distal colonic diverticulosis without signs of acute diverticulitis. 5. Nonobstructing left renal  calculi. 6. Bilateral exophytic kidney lesions of varying complexity are identified. These are suboptimally assessed on the current exam reflecting lack of IV contrast material. Further evaluation with nonemergent renal ultrasound is advised. 7.  Aortic Atherosclerosis (ICD10-I70.0). Electronically Signed   By: Signa Kell M.D.   On: 06/16/2023 15:51   ECHOCARDIOGRAM LIMITED Result Date: 06/15/2023    ECHOCARDIOGRAM LIMITED REPORT   Patient Name:   Nathan Proctor. Date of Exam: 06/15/2023 Medical Rec #:  [id number]              Height:       70.0 in Accession #:    [id number]             Weight:       263.9 lb Date of Birth:  [birth year]              BSA:          2.348 m Patient Age:    83 years               BP:           121/53 mmHg Patient Gender: M                      HR:           90 bpm. Exam Location:  Inpatient Procedure: Limited Echo, Color Doppler and Intracardiac Opacification Agent            (Both Spectral and Color Flow Doppler were utilized during            procedure). Indications:    Stroke i63.9, Shock  History:        Patient has prior history of Echocardiogram examinations, most                 recent 06/05/2023. CHF; Risk Factors:Hypertension and                 Dyslipidemia.  Sonographer:    Irving Burton Senior RDCS Referring Phys: [phone] [facility] ELLISON  Sonographer Comments: Technically difficult due to body habitus, scanned supine on artificial repirator IMPRESSIONS  1. Very difficult to determine LVEF to limited visualization and frequent ectopy. . Left ventricular ejection fraction, by estimation, is 40 to 45%. The left ventricle has mildly decreased function. Left ventricular endocardial border not optimally defined to evaluate regional wall motion.  2. Right ventricular systolic function was not well visualized. The right ventricular size is not well visualized.  3. The inferior vena cava is normal in size with <50% respiratory variability, suggesting right atrial pressure of 8  mmHg.  4. Limited echo FINDINGS  Left Ventricle: Very difficult to determine LVEF to limited visualization and frequent ectopy. Left ventricular ejection fraction, by estimation,  is 40 to 45%. The left ventricle has mildly decreased function. Left ventricular endocardial border not optimally defined to evaluate regional wall motion. Definity contrast agent was given IV to delineate the left ventricular endocardial borders. Right Ventricle: The right ventricular size is not well visualized. Right vetricular wall thickness was not well visualized. Right ventricular systolic function was not well visualized. Venous: The inferior vena cava is normal in size with less than 50% respiratory variability, suggesting right atrial pressure of 8 mmHg. Additional Comments: Color Doppler performed.  Dina Rich MD Electronically signed by Dina Rich MD Signature Date/Time: 06/15/2023/4:31:38 PM    Final    DG Abd 1 View Result Date: 06/15/2023 CLINICAL DATA:  Orogastric tube placement. EXAM: ABDOMEN - 1 VIEW COMPARISON:  Radiograph 06/08/2023 FINDINGS: Tip of the enteric tube below the diaphragm in the stomach, the side port is just beyond the gastroesophageal junction. There is gaseous gastric distension. Dilated bowel in the right abdomen up 4.7 cm, potentially small bowel, but incompletely included in the field of view. IMPRESSION: 1. Tip of the enteric tube below the diaphragm in the stomach, side-port just beyond the gastroesophageal junction. 2. Gaseous gastric distension. Additional dilated bowel in the right abdomen likely small bowel, although incompletely included in the field of view. Electronically Signed   By: Narda Rutherford M.D.   On: 06/15/2023 15:06   DG CHEST PORT 1 VIEW Result Date: 06/15/2023 CLINICAL DATA:  10031 Cough 10031 EXAM: PORTABLE CHEST 1 VIEW COMPARISON:  June 08, 2023 FINDINGS: The cardiomediastinal silhouette is unchanged in contour.Unchanged elevation of the RIGHT hemidiaphragm.  No pleural effusion. No pneumothorax. Bibasilar platelike opacities. Atherosclerotic calcifications. Similar background of interstitial prominence. IMPRESSION: Bibasilar platelike opacities, favored to reflect atelectasis. Electronically Signed   By: Meda Klinefelter M.D.   On: 06/15/2023 12:07   DG CHEST PORT 1 VIEW Result Date: 06/15/2023 CLINICAL DATA:  Respiratory failure, intubation and central line placement. EXAM: PORTABLE CHEST 1 VIEW COMPARISON:  Film earlier today at 0734 hours FINDINGS: Interval intubation with the endotracheal tube tip approximately 3.5 cm above the carina. Left jugular central line placement with the line tip in the upper SVC. No pneumothorax. Volume loss of the right lung remains with elevation of the right hemidiaphragm. Improved expansion of the left lung. Potential underlying mild pulmonary interstitial edema. No significant pleural effusions. IMPRESSION: 1. Interval intubation and left jugular central line placement. No pneumothorax. 2. Improved expansion of the left lung. Persistent volume loss of the right lung with elevation of the right hemidiaphragm. Potential underlying mild pulmonary interstitial edema. Electronically Signed   By: Irish Lack M.D.   On: 06/15/2023 10:42   DG Swallowing Func-Speech Pathology Result Date: 06/12/2023 Table formatting from the original result was not included. Modified Barium Swallow Study Patient Details Name: Nathan Proctor. MRN: [id number] Date of Birth: [birth year] Today's Date: 06/12/2023 HPI/PMH: HPI: Pt is a 83 y.o. male who was admitted as a code stroke on 02/05 due to acute onset of right-sided weakness, right facial droop and aphasia. MRI from 02/06 displayed an evolving acute left MCA infarct, most pronounced at the left insula and left frontal operculum. Pt has intentionally lost 300 pounds in the recent past, with pre-loss weight at ~600Ib. PMHx includes hypertension, diabetes, CKD, prior CVA. Clinical Impression:  Clinical Impression: Pt presents with a mild-moderate oropharyngeal dysphagia (DIGEST Score: 1) primarily characterized by silent penetration of thin liquid at level of VFs, flash penetration of nectar-thick above VFs, and diffuse pharyngeal residue. Pt's  dysphagia severity level dropped from DIGEST score of 3 to 1, compared to previous MBS eval on 02/07.     Pharyngeal residue was present throughout all consistencies. Pt's posture for swallowing was consistently neutral. Pt follows commands for compensatory strategies more readily now with max multimodal cueing. Effective compensatory strategies include multiple swallows, throat clearing, and a straw when used with a verbally cued slow rate. Multiple consecutive sips of thin-liquid presented the most risk for aspiration due to silent penetration of Vfs. Unlike previous MBS study, pt tolerated solid consistency, displaying prolonged mastication but majority clearance of bolus after 2-3 swallows.     Recommendations include upgrading pt's diet to dysphagia 2 (finely chopped) and thin liquids to optimize safety and efficiency for swallowing. SLP f/u to monitor tolerance of thin-liquids with compensatory strategies is highly recommended. Pt needs to remain upright for 30 minutes after PO intake due to remaining pharyngeal residue to optimize airway safety. Factors that may increase risk of adverse event in presence of aspiration [name] & Clearance [name] 2021): Factors that may increase risk of adverse event in presence of aspiration [name] & Clearance [name] 2021): Dependence for feeding and/or oral hygiene Recommendations/Plan: Swallowing Evaluation Recommendations Swallowing Evaluation Recommendations Recommendations: PO diet PO Diet Recommendation: Dysphagia 2 (Finely chopped); Thin liquids (Level 0) Liquid Administration via: Straw Medication Administration: Crushed with puree Supervision: Full supervision/cueing for swallowing strategies; Full assist for feeding Swallowing  strategies  : Slow rate; Small bites/sips; Multiple dry swallows after each bite/sip; Clear throat intermittently Postural changes: Position pt fully upright for meals; Stay upright 30-60 min after meals Oral care recommendations: Oral care BID (2x/day); Staff/trained caregiver to provide oral care Treatment Plan Treatment Plan Follow-up recommendations: Follow physicians's recommendations for discharge plan and follow up therapies Functional status assessment: Patient has had a recent decline in their functional status and demonstrates the ability to make significant improvements in function in a reasonable and predictable amount of time. Recommendations Recommendations for follow up therapy are one component of a multi-disciplinary discharge planning process, led by the attending physician.  Recommendations may be updated based on patient status, additional functional criteria and insurance authorization. Assessment: Orofacial Exam: Orofacial Exam Oral Cavity: Oral Hygiene: WFL Oral Cavity - Dentition: Adequate natural dentition Orofacial Anatomy: WFL Anatomy: Anatomy: WFL Boluses Administered: Boluses Administered Boluses Administered: Thin liquids (Level 0); Mildly thick liquids (Level 2, nectar thick); Moderately thick liquids (Level 3, honey thick); Puree; Solid  Oral Impairment Domain: Oral Impairment Domain Lip Closure: Interlabial escape, no progression to anterior lip Tongue control during bolus hold: Cohesive bolus between tongue to palatal seal Bolus preparation/mastication: Slow prolonged chewing/mashing with complete recollection Bolus transport/lingual motion: Brisk tongue motion Oral residue: Residue collection on oral structures Location of oral residue : Tongue Initiation of pharyngeal swallow : Pyriform sinuses  Pharyngeal Impairment Domain: Pharyngeal Impairment Domain Soft palate elevation: No bolus between soft palate (SP)/pharyngeal wall (PW) Laryngeal elevation: Partial superior movement of  thyroid cartilage/partial approximation of arytenoids to epiglottic petiole Anterior hyoid excursion: Complete anterior movement Epiglottic movement: Complete inversion Laryngeal vestibule closure: Incomplete, narrow column air/contrast in laryngeal vestibule Pharyngeal stripping wave : Present - complete Pharyngeal contraction (A/P view only): N/A Pharyngoesophageal segment opening: Complete distension and complete duration, no obstruction of flow Tongue base retraction: Trace column of contrast or air between tongue base and PPW Pharyngeal residue: Collection of residue within or on pharyngeal structures Location of pharyngeal residue: Diffuse (>3 areas)  Esophageal Impairment Domain: Esophageal Impairment Domain Esophageal clearance upright position: -- (Not tested)  Pill: No data recorded Penetration/Aspiration Scale Score: Penetration/Aspiration Scale Score 1.  Material does not enter airway: Solid; Puree; Moderately thick liquids (Level 3, honey thick) 2.  Material enters airway, remains ABOVE vocal cords then ejected out: Mildly thick liquids (Level 2, nectar thick) 5.  Material enters airway, CONTACTS cords and not ejected out: Thin liquids (Level 0) Compensatory Strategies: Compensatory Strategies Compensatory strategies: Yes Straw: Effective Effective Straw: Mildly thick liquid (Level 2, nectar thick) (Effective with slow rate applied.) Multiple swallows: Effective Effective Multiple Swallows: Thin liquid (Level 0); Mildly thick liquid (Level 2, nectar thick); Moderately thick liquid (Level 3, honey thick); Puree; Solid   General Information: Caregiver present: No  Diet Prior to this Study: Dysphagia 1 (pureed); Moderately thick liquids (Level 3, honey thick)   Temperature : Normal   Respiratory Status: WFL   Supplemental O2: None (Room air)   History of Recent Intubation: Yes  Behavior/Cognition: Alert; Cooperative; Pleasant mood; Requires cueing Self-Feeding Abilities: Needs assist with self-feeding  Baseline vocal quality/speech: Normal Volitional Cough: Unable to elicit Volitional Swallow: Able to elicit Exam Limitations: No limitations Goal Planning: Prognosis for improved oropharyngeal function: Good Barriers to Reach Goals: Language deficits No data recorded No data recorded No data recorded Pain: Pain Assessment Pain Assessment: No/denies pain Pain Score: 0 End of Session: Start Time:SLP Start Time (ACUTE ONLY): 1310 Stop Time: SLP Stop Time (ACUTE ONLY): 1335 Time Calculation:SLP Time Calculation (min) (ACUTE ONLY): 25 min Charges: SLP Evaluations $ SLP Speech Visit: 1 Visit SLP Evaluations $MBS Swallow: 1 Procedure $Swallowing Treatment: 1 Procedure SLP visit diagnosis: SLP Visit Diagnosis: Dysphagia, oropharyngeal phase (R13.12) Past Medical History: Past Medical History: Diagnosis Date  Anemia of chronic disease   Ankle fracture 02/15/2016  Cancer (HCC)   Prostate  Chronic constipation   Chronic kidney disease   stage III  Diabetes mellitus without complication (HCC)   type II   Diabetic peripheral neuropathy (HCC)   Failure to thrive (0-17)   Fracture of left lower leg   Gout   Hyperlipidemia   Hypertension   Morbid obesity (HCC)   Unstable gait  Past Surgical History: Past Surgical History: Procedure Laterality Date  IR CT HEAD LTD  06/05/2023  IR INTRAVSC STENT CERV CAROTID W/O EMB-PROT MOD SED INC ANGIO  06/05/2023  IR PERCUTANEOUS ART THROMBECTOMY/INFUSION INTRACRANIAL INC DIAG ANGIO  06/05/2023  IR US GUIDE VASC ACCESS RIGHT  06/05/2023  ORIF ANKLE FRACTURE Left 02/29/2016  Procedure: OPEN REDUCTION INTERNAL FIXATION (ORIF) ANKLE FRACTURE;  Surgeon: Yolonda Kida, MD;  Location: [facility];  Service: Orthopedics;  Laterality: Left;  PROSTATE SURGERY    RADIOLOGY WITH ANESTHESIA N/A 06/05/2023  Procedure: IR WITH ANESTHESIA;  Surgeon: Radiologist, Medication, MD;  Location: [facility];  Service: Radiology;  Laterality: N/A; Claudine Mouton 06/12/2023, 3:16 PM  DG Abd 1 View Result Date:  06/09/2023 CLINICAL DATA:  [id number]. Encounter for feeding tube placement. EXAM: ABDOMEN - 1 VIEW COMPARISON:  Abdomen film 06/05/2023. FINDINGS: Dobbhoff feeding tube is well placed with the radiopaque tip in the distal stomach. The visualized bowel pattern is nonobstructive. There is barium newly seen in the flexures and transverse colon. There is no supine evidence of free air. There is atelectasis in the lung bases. Cardiomegaly. IMPRESSION: 1. Dobbhoff feeding tube well placed with the radiopaque tip in the distal stomach. 2. Nonobstructive bowel gas pattern. 3. Cardiomegaly. Electronically Signed   By: Almira Bar M.D.   On: 06/09/2023 03:41   DG CHEST PORT 1 VIEW Result  Date: 06/08/2023 CLINICAL DATA:  CHF EXAM: PORTABLE CHEST 1 VIEW COMPARISON:  01/06/2023 FINDINGS: Feeding tube in place. There is cardiomegaly with vascular congestion. Chronic elevation of the right hemidiaphragm with right base atelectasis. Interstitial prominence throughout the lungs, right greater than left could reflect interstitial edema. Possible small effusions. No acute bony abnormality. IMPRESSION: Cardiomegaly with vascular congestion and probable mild interstitial edema. Question small bilateral effusions. Chronic elevation of the right hemidiaphragm with right base atelectasis. Electronically Signed   By: Charlett Nose M.D.   On: 06/08/2023 18:20   DG Swallowing Func-Speech Pathology Result Date: 06/07/2023 Table formatting from the original result was not included. Modified Barium Swallow Study Study completed and documented by [name], SLP Student Supervised and reviewed by Harlon Ditty, MA CCC-SLP [facility] Secure Chat Preferred Office [phone] Patient Details Name: Nathan Proctor. MRN: [id number] Date of Birth: December 29, 1940 Today's Date: 06/07/2023 HPI/PMH: HPI: Pt is a 83 y.o. male who was admitted as a code stroke on 02/05 due to acute onset of right-sided weakness, right facial droop  and aphasia. MRI from 02/06 displayed an evolving acute left MCA infarct, most pronounced at the left insula and left frontal operculum. Pt has intentionally lost 300 pounds in the recent past, with pre-loss weight at ~600Ib. PMHx includes hypertension, diabetes, CKD, prior CVA. Clinical Impression: Clinical Impression: Pt presented with a moderate-severe oropharyngeal dysphagia (DIGEST Score: 3) primarily characterized by silent aspiration of thin liquid, silent penetration of nectar-thick liquid, diffuse pharyngeal residue collection during thin/nectar-thick consistencies, and posterior-escape of bolus across multiple consistencies. Pt positioning was a potential limitation for examination due to consistent posterior head position. More anterior head positioning during nectar-thick liquid displayed less pharyngeal residue, increased hyo-laryngeal excursion, and overall better airway safety. This positioning was not present throughout due to pt's receptive language deficits to understand cues. Alternating between honey-thick and puree consistencies provided more oropharyngeal bolus clearance and no signs of aspiration. Recommend a dysphagia 1 (puree) and honey-thick liquid diet to optimize pt's airway safety and potential for meeting nutritional needs. Slow rate, small bites/sips, natural head posturing, and multiple swallows are compensatory strategies that displayed best efficiency with pt. F/u with SLP for family education, compensatory strategy training, and upgraded diet trials is recommended. Factors that may increase risk of adverse event in presence of aspiration [name] & Clearance [name] 2021): Factors that may increase risk of adverse event in presence of aspiration [name] & Clearance [name] 2021): Weak cough Recommendations/Plan: Swallowing Evaluation Recommendations Swallowing Evaluation Recommendations Recommendations: PO diet PO Diet Recommendation: Dysphagia 1 (Pureed); Moderately thick liquids (Level 3, honey  thick) Liquid Administration via: Spoon Medication Administration: Crushed with puree Supervision: Full supervision/cueing for swallowing strategies; Full assist for feeding Swallowing strategies  : Slow rate; Small bites/sips; Multiple dry swallows after each bite/sip (Pt needs to be in a neutral head position (no posterior head tilt).) Postural changes: Position pt fully upright for meals; Stay upright 30-60 min after meals Oral care recommendations: Oral care BID (2x/day); Staff/trained caregiver to provide oral care Caregiver Recommendations: Have oral suction available Treatment Plan Treatment Plan Treatment recommendations: Therapy as outlined in treatment plan below Follow-up recommendations: Follow physicians's recommendations for discharge plan and follow up therapies Functional status assessment: Patient has had a recent decline in their functional status and demonstrates the ability to make significant improvements in function in a reasonable and predictable amount of time. Treatment frequency: Min 2x/week Treatment duration: 2 weeks Interventions: Aspiration precaution training; Compensatory techniques; Patient/family education; Diet toleration management by  SLP; Trials of upgraded texture/liquids Recommendations Recommendations for follow up therapy are one component of a multi-disciplinary discharge planning process, led by the attending physician.  Recommendations may be updated based on patient status, additional functional criteria and insurance authorization. Assessment: Orofacial Exam: Orofacial Exam Oral Cavity - Dentition: Adequate natural dentition Orofacial Anatomy: WFL Anatomy: Anatomy: WFL Boluses Administered: Boluses Administered Boluses Administered: Thin liquids (Level 0); Mildly thick liquids (Level 2, nectar thick); Moderately thick liquids (Level 3, honey thick); Puree; Solid  Oral Impairment Domain: Oral Impairment Domain Lip Closure: Escape progressing to mid-chin Tongue control  during bolus hold: Posterior escape of greater than half of bolus Bolus preparation/mastication: -- (Pt did not chew solid and SLP had to remove it from mouth.) Bolus transport/lingual motion: Repetitive/disorganized tongue motion Oral residue: Residue collection on oral structures Location of oral residue : Tongue; Palate Initiation of pharyngeal swallow : Pyriform sinuses  Pharyngeal Impairment Domain: Pharyngeal Impairment Domain Soft palate elevation: No bolus between soft palate (SP)/pharyngeal wall (PW) Laryngeal elevation: Partial superior movement of thyroid cartilage/partial approximation of arytenoids to epiglottic petiole Anterior hyoid excursion: Partial anterior movement Epiglottic movement: Partial inversion Laryngeal vestibule closure: Incomplete, narrow column air/contrast in laryngeal vestibule Pharyngeal stripping wave : Present - complete Pharyngeal contraction (A/P view only): N/A Pharyngoesophageal segment opening: Complete distension and complete duration, no obstruction of flow Tongue base retraction: Narrow column of contrast or air between tongue base and PPW Pharyngeal residue: Collection of residue within or on pharyngeal structures Location of pharyngeal residue: Diffuse (>3 areas)  Esophageal Impairment Domain: Esophageal Impairment Domain Esophageal clearance upright position: -- (Not tested.) Pill: No data recorded Penetration/Aspiration Scale Score: Penetration/Aspiration Scale Score 1.  Material does not enter airway: Puree; Moderately thick liquids (Level 3, honey thick) 3.  Material enters airway, remains ABOVE vocal cords and not ejected out: Mildly thick liquids (Level 2, nectar thick) 8.  Material enters airway, passes BELOW cords without attempt by patient to eject out (silent aspiration) : Thin liquids (Level 0) Compensatory Strategies: Compensatory Strategies Compensatory strategies: Yes Straw: Ineffective Multiple swallows: Effective (Required max cueing due to pt's  language.) Effective Multiple Swallows: Puree; Moderately thick liquid (Level 3, honey thick); Mildly thick liquid (Level 2, nectar thick)   General Information: Caregiver present: No  Diet Prior to this Study: NPO   Temperature : Normal   Respiratory Status: WFL   Supplemental O2: None (Room air)   No data recorded Behavior/Cognition: Alert; Cooperative; Pleasant mood; Requires cueing Self-Feeding Abilities: Needs assist with self-feeding Baseline vocal quality/speech: Dysphonic Volitional Cough: Unable to elicit Volitional Swallow: Able to elicit Exam Limitations: Poor positioning Goal Planning: Prognosis for improved oropharyngeal function: Good Barriers to Reach Goals: Language deficits No data recorded No data recorded Consulted and agree with results and recommendations: Pt unable/family or caregiver not available Pain: Pain Assessment Pain Assessment: No/denies pain Faces Pain Scale: 0 Facial Expression: 0 Body Movements: 0 Muscle Tension: 0 Compliance with ventilator (intubated pts.): N/A Vocalization (extubated pts.): 0 CPOT Total: 0 End of Session: Start Time:SLP Start Time (ACUTE ONLY): 0903 Stop Time: SLP Stop Time (ACUTE ONLY): 0930 Time Calculation:SLP Time Calculation (min) (ACUTE ONLY): 27 min Charges: SLP Evaluations $ SLP Speech Visit: 1 Visit SLP Evaluations $BSS Swallow: 1 Procedure $MBS Swallow: 1 Procedure $ SLP EVAL LANGUAGE/SOUND PRODUCTION: 1 Procedure $Swallowing Treatment: 1 Procedure SLP visit diagnosis: SLP Visit Diagnosis: Dysphagia, oropharyngeal phase (R13.12) Past Medical History: Past Medical History: Diagnosis Date  Anemia of chronic disease   Ankle fracture 02/15/2016  Cancer [facility])   Prostate  Chronic constipation   Chronic kidney disease   stage III  Diabetes mellitus without complication (HCC)   type II   Diabetic peripheral neuropathy (HCC)   Failure to thrive (0-17)   Fracture of left lower leg   Gout   Hyperlipidemia   Hypertension   Morbid obesity (HCC)   Unstable gait  Past  Surgical History: Past Surgical History: Procedure Laterality Date  IR CT HEAD LTD  06/05/2023  IR INTRAVSC STENT CERV CAROTID W/O EMB-PROT MOD SED INC ANGIO  06/05/2023  IR PERCUTANEOUS ART THROMBECTOMY/INFUSION INTRACRANIAL INC DIAG ANGIO  06/05/2023  IR US GUIDE VASC ACCESS RIGHT  06/05/2023  ORIF ANKLE FRACTURE Left 02/29/2016  Procedure: OPEN REDUCTION INTERNAL FIXATION (ORIF) ANKLE FRACTURE;  Surgeon: Yolonda Kida, MD;  Location: [facility];  Service: Orthopedics;  Laterality: Left;  PROSTATE SURGERY    RADIOLOGY WITH ANESTHESIA N/A 06/05/2023  Procedure: IR WITH ANESTHESIA;  Surgeon: Radiologist, Medication, MD;  Location: [facility];  Service: Radiology;  Laterality: N/A; DeBlois, Riley Nearing 06/07/2023, 12:15 PM  CT HEAD WO CONTRAST ( ) Result Date: 06/07/2023 CLINICAL DATA:  83 year old male status post code stroke presentation, left MCA M2 occlusion, with left MCA infarct with confluent petechial hemorrhage. EXAM: CT HEAD WITHOUT CONTRAST TECHNIQUE: Contiguous axial images were obtained from the base of the skull through the vertex without intravenous contrast. RADIATION DOSE REDUCTION: This exam was performed according to the departmental dose-optimization program which includes automated exposure control, adjustment of the mA and/or kV according to patient size and/or use of iterative reconstruction technique. COMPARISON:  Brain MRI yesterday.  Head CT 06/05/2023. FINDINGS: Brain: Confluent mixed density infarct in the left MCA middle division, epicenter at the insula and operculum. Confluent petechial hemorrhage on series 3, image 16 appears stable from the MRI. Superimposed trace subarachnoid blood is difficult to exclude by CT (series 3, image 13), but was not apparent on MRI. Stable mild mass effect on the left lateral ventricle with only trace rightward midline shift (series 3, image 16). No ventriculomegaly. Elsewhere gray-white differentiation is stable from the presentation CT. Basilar cisterns remain  patent. Vascular: Resolved hyperdense left MCA since presentation. Calcified atherosclerosis at the skull base. Skull: Stable, intact. Sinuses/Orbits: Visualized paranasal sinuses and mastoids are stable and well aerated. Other: Left nasoenteric tube now in place. Stable orbit and scalp soft tissues. IMPRESSION: 1. Stable by CT confluent Left MCA middle division infarct with petechial hemorrhage (Heidelberg classification 1b: HI2, confluent petechiae, no mass effect). Questionable trace superimposed SAH, but was not apparent on MRI. 2. Stable minimal intracranial mass effect. 3. No new intracranial abnormality. Electronically Signed   By: Odessa Fleming M.D.   On: 06/07/2023 05:37   MR BRAIN WO CONTRAST Result Date: 06/06/2023 CLINICAL DATA:  Follow-up examination for stroke. EXAM: MRI HEAD WITHOUT CONTRAST TECHNIQUE: Multiplanar, multiecho pulse sequences of the brain and surrounding structures were obtained without intravenous contrast. COMPARISON:  Comparison made to multiple previous exams from 06/05/2023. FINDINGS: Brain: Examination degraded by motion artifact. Cerebral volume within normal limits for age. Patchy T2/FLAIR hyperintensity involving the periventricular deep white matter both cerebral hemispheres, consistent with chronic small vessel ischemic disease, mild in nature. Small remote infarct noted within the right cerebellum. Confluent restricted diffusion involving the left insula and left frontal operculum, consistent with evolving acute left MCA distribution infarct. Area of infarction measures up to approximately 6 cm in AP diameter. Prominent associated susceptibility artifact, consistent with petechial hemorrhage (series 7, image 61) (  Heidelberg classification 1b: HI2, confluent petechiae, no mass effect. No frank or organized hematoma evident by MRI. No significant regional mass effect. Few additional small foci of infarction noted within the left parieto-occipital region posteriorly (series 3,  images 40, 29). Apparent diffusion signal along the right frontal parafalcine region felt to be consistent with artifact related to dural calcification. Note made of a few additional chronic micro hemorrhages within the left cerebellum and right thalamus. No mass lesion or midline shift. Ventricles normal size without hydrocephalus. No extra-axial fluid collection. Pituitary gland suprasellar region within normal limits. Vascular: Major intracranial vascular flow voids are maintained. Skull and upper cervical spine: Craniocervical junction within normal limits. Bone marrow signal intensity overall within normal limits. No scalp soft tissue abnormality. Sinuses/Orbits: Prior bilateral ocular lens replacement. Paranasal sinuses are clear. No mastoid effusion. Other: None. IMPRESSION: 1. Evolving acute left MCA distribution infarct, most pronounced at the left insula and left frontal operculum. Prominent associated petechial hemorrhage without frank intraparenchymal hematoma (Heidelberg classification 1b: HI2, confluent petechiae, no mass effect). 2. Few additional small foci of acute infarction within the left parieto-occipital region posteriorly. 3. Underlying mild chronic microvascular ischemic disease with small remote right cerebellar infarct. Electronically Signed   By: Rise Mu M.D.   On: 06/06/2023 03:12   ECHOCARDIOGRAM COMPLETE Result Date: 06/05/2023    ECHOCARDIOGRAM REPORT   Patient Name:   Hashir Deleeuw. Date of Exam: 06/05/2023 Medical Rec #:  [id number]              Height:       70.0 in Accession #:    [id number]             Weight:       292.3 lb Date of Birth:  [birth year]              BSA:          2.453 m Patient Age:    82 years               BP:           129/72 mmHg Patient Gender: M                      HR:           89 bpm. Exam Location:  Inpatient Procedure: 2D Echo, Color Doppler, Cardiac Doppler and Intracardiac            Opacification Agent Indications:    Stroke I63.9   History:        Patient has prior history of Echocardiogram examinations, most                 recent 10/06/2019. Risk Factors:Diabetes, Hypertension and                 Dyslipidemia.  Sonographer:    Harriette Bouillon RDCS Referring Phys: Lynnae January IMPRESSIONS  1. Left ventricular ejection fraction, by estimation, is 30%. The left ventricle has moderately decreased function. The left ventricle demonstrates global hypokinesis. There is mild left ventricular hypertrophy.  2. Right ventricular systolic function is normal. The right ventricular size is normal.  3. Trivial mitral valve regurgitation.  4. The aortic valve is tricuspid. Aortic valve regurgitation is not visualized.  5. The inferior vena cava is normal in size with greater than 50% respiratory variability, suggesting right atrial pressure of 3 mmHg. Comparison(s): The left ventricular function is worsened. FINDINGS  Left Ventricle:  Left ventricular ejection fraction, by estimation, is 30%. The left ventricle has moderately decreased function. The left ventricle demonstrates global hypokinesis. Definity contrast agent was given IV to delineate the left ventricular endocardial borders. The left ventricular internal cavity size was normal in size. There is mild left ventricular hypertrophy. Right Ventricle: The right ventricular size is normal. Right vetricular wall thickness was not assessed. Right ventricular systolic function is normal. Left Atrium: Left atrial size was normal in size. Right Atrium: Right atrial size was normal in size. Pericardium: There is no evidence of pericardial effusion. Mitral Valve: There is mild thickening of the mitral valve leaflet(s). Mild to moderate mitral annular calcification. Trivial mitral valve regurgitation. Tricuspid Valve: The tricuspid valve is normal in structure. Tricuspid valve regurgitation is trivial. Aortic Valve: The aortic valve is tricuspid. Aortic valve regurgitation is not visualized. Pulmonic Valve: The  pulmonic valve was normal in structure. Pulmonic valve regurgitation is not visualized. Aorta: The aortic root and ascending aorta are structurally normal, with no evidence of dilitation. Venous: The inferior vena cava is normal in size with greater than 50% respiratory variability, suggesting right atrial pressure of 3 mmHg. IAS/Shunts: The interatrial septum was not assessed.  LEFT VENTRICLE PLAX 2D LVIDd:         5.00 cm   Diastology LVIDs:         4.40 cm   LV e' lateral: 5.33 cm/s LV PW:         1.20 cm LV IVS:        1.10 cm LVOT diam:     2.30 cm LV SV:         55 LV SV Index:   23 LVOT Area:     4.15 cm  RIGHT VENTRICLE RV S prime:     14.40 cm/s LEFT ATRIUM         Index LA diam:    4.60 cm 1.88 cm/m  AORTIC VALVE LVOT Vmax:   65.50 cm/s LVOT Vmean:  45.200 cm/s LVOT VTI:    0.133 m  AORTA Ao Root diam: 3.00 cm Ao Asc diam:  3.50 cm  SHUNTS Systemic VTI:  0.13 m Systemic Diam: 2.30 cm Dietrich Pates MD Electronically signed by Dietrich Pates MD Signature Date/Time: 06/05/2023/9:33:38 PM    Final    DG Abd Portable 1V Result Date: 06/05/2023 CLINICAL DATA:  Feeding tube placement. EXAM: PORTABLE ABDOMEN - 1 VIEW COMPARISON:  None Available. FINDINGS: Distal tip of feeding tube is seen in expected position of distal stomach. IMPRESSION: Distal tip of feeding tube seen in expected position of distal stomach. Electronically Signed   By: Lupita Raider M.D.   On: 06/05/2023 15:49   CT HEAD WO CONTRAST Result Date: 06/05/2023 CLINICAL DATA:  Stroke, follow-up. Status post intracranial mechanical thrombectomy and stenting of the left ICA bifurcation. EXAM: CT HEAD WITHOUT CONTRAST TECHNIQUE: Contiguous axial images were obtained from the base of the skull through the vertex without intravenous contrast. RADIATION DOSE REDUCTION: This exam was performed according to the departmental dose-optimization program which includes automated exposure control, adjustment of the mA and/or kV according to patient size and/or use  of iterative reconstruction technique. COMPARISON:  CT head without contrast and CT angio head and neck 06/05/2023. By plain CT 06/05/2023. FINDINGS: Brain: The study is mildly degraded by patient motion. The left insular and left opercular infarct is somewhat obscured by patient motion. The cortex is slightly hyperdense, potentially reflecting reperfusion. No hemorrhage is present. Basal ganglia are intact.  Subcortical white matter hypoattenuation in the high left frontal lobe is new. Mild right-sided white matter disease is stable. Basal ganglia are intact. A remote lacunar infarct is again noted in the right cerebellum. The brainstem and cerebellum are otherwise within normal limits. Vascular: Atherosclerotic calcifications are present within the cavernous internal carotid arteries and at the normal origin of both vertebral arteries. No hyperdense vessel is present. Skull: Calvarium is intact. No focal lytic or blastic lesions are present. No significant extracranial soft tissue lesion is present. Sinuses/Orbits: The paranasal sinuses and mastoid air cells are clear. Bilateral lens replacements are noted. Globes and orbits are otherwise unremarkable. IMPRESSION: 1. The left insular and left opercular infarct is somewhat obscured by patient motion. 2. The cortex is slightly hyperdense, potentially reflecting reperfusion. 3. No hemorrhage. 4. Subcortical white matter hypoattenuation in the high left frontal lobe is new. This may reflect ischemic changes or edema related to the reperfusion. 5. Remote lacunar infarct of the right cerebellum. 6. Stable mild right-sided white matter disease. This likely reflects the sequela of chronic microvascular ischemia. Electronically Signed   By: Marin Roberts M.D.   On: 06/05/2023 14:38   IR PERCUTANEOUS ART THROMBECTOMY/INFUSION INTRACRANIAL INC DIAG ANGIO Result Date: 06/05/2023 INDICATION: 83 year old male presenting with right-sided weakness and aphasia; NIHSS 28.  His last known well was 10 p.m. on 06/04/2023. His past medical history significant for prior stroke, hypertension, diabetes and chronic kidney disease; baseline modified Rankin scale 0. Head CT showed hypodensity within the left insula, basal ganglia and left frontal operculum (ASPECTS 7). No IV thrombolytic given as patient was outside the window. CT angiogram of the head and neck showed an occlusion of a left M2/MCA anterior division branch. CT perfusion showed a 41 mL core infarct with a 45 mL ischemic penumbra. She was transferred to our service for mechanical thrombectomy. EXAM: ULTRASOUND-GUIDED VASCULAR ACCESS DIAGNOSTIC CEREBRAL ANGIOGRAM MECHANICAL THROMBECTOMY FLAT PANEL HEAD CT LEFT CAROTID STENTING AND ANGIOPLASTY WITHOUT CEREBRAL PROTECTION DEVICE COMPARISON:  CT/CT angiogram of the head and neck June 05, 2023. MEDICATIONS: No antibiotics administered. ANESTHESIA/SEDATION: The procedure was performed under general anesthesia. CONTRAST:  80 mL of Omnipaque 300 milligram/mL FLUOROSCOPY: Radiation Exposure Index (as provided by the fluoroscopic device): 1315 mGy Kerma COMPLICATIONS: None immediate. TECHNIQUE: Informed written consent was obtained from the patient's wife after a thorough discussion of the procedural risks, benefits and alternatives. All questions were addressed. Maximal Sterile Barrier Technique was utilized including caps, mask, sterile gowns, sterile gloves, sterile drape, hand hygiene and skin antiseptic. A timeout was performed prior to the initiation of the procedure. The right groin was prepped and draped in the usual sterile fashion. Using a micropuncture kit and the modified Seldinger technique, access was gained to the right common femoral artery and an 8 French sheath was placed. Real-time ultrasound guidance was utilized for vascular access including the acquisition of a permanent ultrasound image documenting patency of the accessed vessel. Under fluoroscopy, an 8 [place]  Walrus balloon guide catheter was navigated over a 6 [place] VTK catheter and a 0.035" Terumo Glidewire into the aortic arch. The catheter was placed into the left common carotid artery and then advanced into the left internal carotid artery. The diagnostic catheter was removed. Frontal and lateral angiograms of the head were obtained. FINDINGS: 1. Ultrasound showed heavily calcified right common femoral artery is maintained patency and caliber. 2. Proximal occlusion of a left M2/MCA anterior division branch. 3. Atherosclerotic changes of the intracranial left ICA with mild stenosis at the  distal cavernous segment. 4. 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 PROCEDURE: Using biplane roadmap guidance, a Red 62 aspiration catheter was navigated over Colossus 35 microguidewire into the cavernous segment of the left ICA. The aspiration catheter was then advanced to the level of occlusion and connected to an aspiration pump. Continuous aspiration was performed for 2 minutes. The guide catheter was connected to a VacLok syringe and the guiding catheter balloon was inflated. The aspiration catheter was subsequently removed under constant aspiration. The guide catheter was aspirated for debris. Left internal carotid artery angiograms with frontal and lateral views of the head showed complete recanalization of the left MCA vascular tree. The guide catheter was retracted into the neck. Frontal and lateral angiograms of the neck were obtained. Improvement of the degree of stenosis in the carotid bulb compared to prior CT angiogram. There is prominent luminal irregularity at the carotid bulb residual moderate stenosis. Increased tortuosity of the proximal/mid cervical left ICA with kinking. Left internal carotid artery angiograms with left anterior oblique views of the neck showed evidence of filling defects at the level  of the carotid bulb. Flat panel CT of the head was obtained and post processed in a separate workstation with concurrent attending physician supervision. Selected images were sent to PACS. No evidence of hemorrhagic complication. There is mild contrast staining of the left insular and frontal cortex. Repeat left internal carotid artery angiograms with frontal and lateral views of the head showed Amy seen left M3/MCA branch to the left parietal region. Left common carotid artery angiograms with frontal and lateral views of the neck showed vertebra Gretchen of stenosis at the left ICA bulb with more prominent filling defect. At this point, patient was loaded on cangrelor followed by continuous drip. Using biplane roadmap guidance, a 4-7 mm Emboshield NAV6 cerebral protection device was advanced into the cervical left ICA. However, multiple attempts to advance a cerebral protection device through the left ICA kinking proved unsuccessful. The cerebral protection device was subsequently removed. Using biplane roadmap guidance, a 10-8 x 40 mm XACT carotid stent was navigated and deployed from the distal left common carotid artery to the proximal left internal carotid artery, proximal to the vessel kinking. Suboptimal stent expansion was noted. Then, a 6 x 30 mm Viatrac balloon was navigated into the recently deployed stent. Angioplasty was performed under fluoroscopy. Left internal carotid artery angiograms with frontal and lateral views of the neck showed adequate stent positioning and expansion with brisk anterograde flow. Left internal carotid artery angiograms with frontal and lateral views of the head showed improvement of anterograde flow in the left MCA vascular tree with brisk anterograde flow. Delayed left common carotid artery angiograms with frontal and lateral views of the neck showed no evidence of clot formation within the stent. The catheter was subsequently Riddle. Right common femoral artery angiogram was  obtained in right anterior oblique view. The puncture is at the level of the common femoral artery. The artery has normal caliber, adequate for closure device. The sheath was exchanged over the wire for an 8 [place] Angio-Seal which was utilized for access closure. Immediate hemostasis was achieved. IMPRESSION: 1. Successful mechanical thrombectomy for treatment of a proximal left M2/MCA anterior division branch occlusion achieving complete recanalization (TICI 3). 2. 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  PLAN: Continue cangrelor infusion until patient is transitioned to oral dual antiplatelet therapy. Electronically Signed   By: Baldemar Lenis M.D.   On: 06/05/2023 13:16   IR US Guide Vasc Access Right Result Date: 06/05/2023 INDICATION: 83 year old male presenting with right-sided weakness and aphasia; NIHSS 28. His last known well was 10 p.m. on 06/04/2023. His past medical history significant for prior stroke, hypertension, diabetes and chronic kidney disease; baseline modified Rankin scale 0. Head CT showed hypodensity within the left insula, basal ganglia and left frontal operculum (ASPECTS 7). No IV thrombolytic given as patient was outside the window. CT angiogram of the head and neck showed an occlusion of a left M2/MCA anterior division branch. CT perfusion showed a 41 mL core infarct with a 45 mL ischemic penumbra. She was transferred to our service for mechanical thrombectomy. EXAM: ULTRASOUND-GUIDED VASCULAR ACCESS DIAGNOSTIC CEREBRAL ANGIOGRAM MECHANICAL THROMBECTOMY FLAT PANEL HEAD CT LEFT CAROTID STENTING AND ANGIOPLASTY WITHOUT CEREBRAL PROTECTION DEVICE COMPARISON:  CT/CT angiogram of the head and neck June 05, 2023. MEDICATIONS: No antibiotics administered. ANESTHESIA/SEDATION: The procedure was performed under general anesthesia.  CONTRAST:  80 mL of Omnipaque 300 milligram/mL FLUOROSCOPY: Radiation Exposure Index (as provided by the fluoroscopic device): 1315 mGy Kerma COMPLICATIONS: None immediate. TECHNIQUE: Informed written consent was obtained from the patient's wife after a thorough discussion of the procedural risks, benefits and alternatives. All questions were addressed. Maximal Sterile Barrier Technique was utilized including caps, mask, sterile gowns, sterile gloves, sterile drape, hand hygiene and skin antiseptic. A timeout was performed prior to the initiation of the procedure. The right groin was prepped and draped in the usual sterile fashion. Using a micropuncture kit and the modified Seldinger technique, access was gained to the right common femoral artery and an 8 French sheath was placed. Real-time ultrasound guidance was utilized for vascular access including the acquisition of a permanent ultrasound image documenting patency of the accessed vessel. Under fluoroscopy, an 8 [place] Walrus balloon guide catheter was navigated over a 6 [place] VTK catheter and a 0.035" Terumo Glidewire into the aortic arch. The catheter was placed into the left common carotid artery and then advanced into the left internal carotid artery. The diagnostic catheter was removed. Frontal and lateral angiograms of the head were obtained. FINDINGS: 1. Ultrasound showed heavily calcified right common femoral artery is maintained patency and caliber. 2. Proximal occlusion of a left M2/MCA anterior division branch. 3. Atherosclerotic changes of the intracranial left ICA with mild stenosis at the distal cavernous segment. 4. 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 PROCEDURE: Using biplane roadmap guidance, a Red 62 aspiration catheter was navigated over Colossus 35 microguidewire into the cavernous segment of the left ICA. The aspiration catheter  was then advanced to the level of occlusion and connected to an aspiration pump. Continuous aspiration was performed for 2 minutes. The guide catheter was connected to a VacLok syringe and the guiding catheter balloon was inflated. The aspiration catheter was subsequently removed under constant aspiration. The guide catheter was aspirated for debris. Left internal carotid artery angiograms with frontal and lateral views of the head showed complete recanalization of the left MCA vascular tree. The guide catheter was retracted into the neck. Frontal and lateral angiograms of the neck were obtained. Improvement of the degree of stenosis in the carotid bulb compared to prior CT angiogram. There is prominent luminal irregularity at the carotid bulb residual  moderate stenosis. Increased tortuosity of the proximal/mid cervical left ICA with kinking. Left internal carotid artery angiograms with left anterior oblique views of the neck showed evidence of filling defects at the level of the carotid bulb. Flat panel CT of the head was obtained and post processed in a separate workstation with concurrent attending physician supervision. Selected images were sent to PACS. No evidence of hemorrhagic complication. There is mild contrast staining of the left insular and frontal cortex. Repeat left internal carotid artery angiograms with frontal and lateral views of the head showed Amy seen left M3/MCA branch to the left parietal region. Left common carotid artery angiograms with frontal and lateral views of the neck showed vertebra Gretchen of stenosis at the left ICA bulb with more prominent filling defect. At this point, patient was loaded on cangrelor followed by continuous drip. Using biplane roadmap guidance, a 4-7 mm Emboshield NAV6 cerebral protection device was advanced into the cervical left ICA. However, multiple attempts to advance a cerebral protection device through the left ICA kinking proved unsuccessful. The cerebral  protection device was subsequently removed. Using biplane roadmap guidance, a 10-8 x 40 mm XACT carotid stent was navigated and deployed from the distal left common carotid artery to the proximal left internal carotid artery, proximal to the vessel kinking. Suboptimal stent expansion was noted. Then, a 6 x 30 mm Viatrac balloon was navigated into the recently deployed stent. Angioplasty was performed under fluoroscopy. Left internal carotid artery angiograms with frontal and lateral views of the neck showed adequate stent positioning and expansion with brisk anterograde flow. Left internal carotid artery angiograms with frontal and lateral views of the head showed improvement of anterograde flow in the left MCA vascular tree with brisk anterograde flow. Delayed left common carotid artery angiograms with frontal and lateral views of the neck showed no evidence of clot formation within the stent. The catheter was subsequently Riddle. Right common femoral artery angiogram was obtained in right anterior oblique view. The puncture is at the level of the common femoral artery. The artery has normal caliber, adequate for closure device. The sheath was exchanged over the wire for an 8 [place] Angio-Seal which was utilized for access closure. Immediate hemostasis was achieved. IMPRESSION: 1. Successful mechanical thrombectomy for treatment of a proximal left M2/MCA anterior division branch occlusion achieving complete recanalization (TICI 3). 2. 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 PLAN: Continue cangrelor infusion until patient is transitioned to oral dual antiplatelet therapy. Electronically Signed   By: Baldemar Lenis M.D.   On: 06/05/2023 13:16   IR INTRAVSC STENT CERV CAROTID W/O EMB-PROT MOD SED Result Date: 06/05/2023 INDICATION: 83 year old male  presenting with right-sided weakness and aphasia; NIHSS 28. His last known well was 10 p.m. on 06/04/2023. His past medical history significant for prior stroke, hypertension, diabetes and chronic kidney disease; baseline modified Rankin scale 0. Head CT showed hypodensity within the left insula, basal ganglia and left frontal operculum (ASPECTS 7). No IV thrombolytic given as patient was outside the window. CT angiogram of the head and neck showed an occlusion of a left M2/MCA anterior division branch. CT perfusion showed a 41 mL core infarct with a 45 mL ischemic penumbra. She was transferred to our service for mechanical thrombectomy. EXAM: ULTRASOUND-GUIDED VASCULAR ACCESS DIAGNOSTIC CEREBRAL ANGIOGRAM MECHANICAL THROMBECTOMY FLAT PANEL HEAD CT LEFT CAROTID STENTING AND ANGIOPLASTY WITHOUT CEREBRAL PROTECTION DEVICE COMPARISON:  CT/CT angiogram of the head and neck June 05, 2023. MEDICATIONS: No antibiotics administered. ANESTHESIA/SEDATION: The procedure was performed under general anesthesia. CONTRAST:  80 mL of Omnipaque 300 milligram/mL FLUOROSCOPY: Radiation Exposure Index (as provided by the fluoroscopic device): 1315 mGy Kerma COMPLICATIONS: None immediate. TECHNIQUE: Informed written consent was obtained from the patient's wife after a thorough discussion of the procedural risks, benefits and alternatives. All questions were addressed. Maximal Sterile Barrier Technique was utilized including caps, mask, sterile gowns, sterile gloves, sterile drape, hand hygiene and skin antiseptic. A timeout was performed prior to the initiation of the procedure. The right groin was prepped and draped in the usual sterile fashion. Using a micropuncture kit and the modified Seldinger technique, access was gained to the right common femoral artery and an 8 French sheath was placed. Real-time ultrasound guidance was utilized for vascular access including the acquisition of a permanent ultrasound image documenting patency  of the accessed vessel. Under fluoroscopy, an 8 [place] Walrus balloon guide catheter was navigated over a 6 [place] VTK catheter and a 0.035" Terumo Glidewire into the aortic arch. The catheter was placed into the left common carotid artery and then advanced into the left internal carotid artery. The diagnostic catheter was removed. Frontal and lateral angiograms of the head were obtained. FINDINGS: 1. Ultrasound showed heavily calcified right common femoral artery is maintained patency and caliber. 2. Proximal occlusion of a left M2/MCA anterior division branch. 3. Atherosclerotic changes of the intracranial left ICA with mild stenosis at the distal cavernous segment. 4. 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 PROCEDURE: Using biplane roadmap guidance, a Red 62 aspiration catheter was navigated over Colossus 35 microguidewire into the cavernous segment of the left ICA. The aspiration catheter was then advanced to the level of occlusion and connected to an aspiration pump. Continuous aspiration was performed for 2 minutes. The guide catheter was connected to a VacLok syringe and the guiding catheter balloon was inflated. The aspiration catheter was subsequently removed under constant aspiration. The guide catheter was aspirated for debris. Left internal carotid artery angiograms with frontal and lateral views of the head showed complete recanalization of the left MCA vascular tree. The guide catheter was retracted into the neck. Frontal and lateral angiograms of the neck were obtained. Improvement of the degree of stenosis in the carotid bulb compared to prior CT angiogram. There is prominent luminal irregularity at the carotid bulb residual moderate stenosis. Increased tortuosity of the proximal/mid cervical left ICA with kinking. Left internal carotid artery angiograms with left anterior oblique views of the  neck showed evidence of filling defects at the level of the carotid bulb. Flat panel CT of the head was obtained and post processed in a separate workstation with concurrent attending physician supervision. Selected images were sent to PACS. No evidence of hemorrhagic complication. There is mild contrast staining of the left insular and frontal cortex. Repeat left internal carotid artery angiograms with frontal and lateral views of the head showed Amy seen left M3/MCA branch to the left parietal region. Left common carotid artery angiograms with frontal and lateral views of the neck showed vertebra Gretchen of stenosis at the left ICA bulb with more prominent filling defect. At this point, patient was loaded on cangrelor followed by continuous drip. Using biplane roadmap guidance, a 4-7 mm Emboshield NAV6 cerebral protection device was advanced into the cervical left ICA. However, multiple attempts to advance  a cerebral protection device through the left ICA kinking proved unsuccessful. The cerebral protection device was subsequently removed. Using biplane roadmap guidance, a 10-8 x 40 mm XACT carotid stent was navigated and deployed from the distal left common carotid artery to the proximal left internal carotid artery, proximal to the vessel kinking. Suboptimal stent expansion was noted. Then, a 6 x 30 mm Viatrac balloon was navigated into the recently deployed stent. Angioplasty was performed under fluoroscopy. Left internal carotid artery angiograms with frontal and lateral views of the neck showed adequate stent positioning and expansion with brisk anterograde flow. Left internal carotid artery angiograms with frontal and lateral views of the head showed improvement of anterograde flow in the left MCA vascular tree with brisk anterograde flow. Delayed left common carotid artery angiograms with frontal and lateral views of the neck showed no evidence of clot formation within the stent. The catheter was  subsequently Riddle. Right common femoral artery angiogram was obtained in right anterior oblique view. The puncture is at the level of the common femoral artery. The artery has normal caliber, adequate for closure device. The sheath was exchanged over the wire for an 8 [place] Angio-Seal which was utilized for access closure. Immediate hemostasis was achieved. IMPRESSION: 1. Successful mechanical thrombectomy for treatment of a proximal left M2/MCA anterior division branch occlusion achieving complete recanalization (TICI 3). 2. 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 PLAN: Continue cangrelor infusion until patient is transitioned to oral dual antiplatelet therapy. Electronically Signed   By: Baldemar Lenis M.D.   On: 06/05/2023 13:16   IR CT Head Ltd Result Date: 06/05/2023 INDICATION: 83 year old male presenting with right-sided weakness and aphasia; NIHSS 28. His last known well was 10 p.m. on 06/04/2023. His past medical history significant for prior stroke, hypertension, diabetes and chronic kidney disease; baseline modified Rankin scale 0. Head CT showed hypodensity within the left insula, basal ganglia and left frontal operculum (ASPECTS 7). No IV thrombolytic given as patient was outside the window. CT angiogram of the head and neck showed an occlusion of a left M2/MCA anterior division branch. CT perfusion showed a 41 mL core infarct with a 45 mL ischemic penumbra. She was transferred to our service for mechanical thrombectomy. EXAM: ULTRASOUND-GUIDED VASCULAR ACCESS DIAGNOSTIC CEREBRAL ANGIOGRAM MECHANICAL THROMBECTOMY FLAT PANEL HEAD CT LEFT CAROTID STENTING AND ANGIOPLASTY WITHOUT CEREBRAL PROTECTION DEVICE COMPARISON:  CT/CT angiogram of the head and neck June 05, 2023. MEDICATIONS: No antibiotics administered. ANESTHESIA/SEDATION: The  procedure was performed under general anesthesia. CONTRAST:  80 mL of Omnipaque 300 milligram/mL FLUOROSCOPY: Radiation Exposure Index (as provided by the fluoroscopic device): 1315 mGy Kerma COMPLICATIONS: None immediate. TECHNIQUE: Informed written consent was obtained from the patient's wife after a thorough discussion of the procedural risks, benefits and alternatives. All questions were addressed. Maximal Sterile Barrier Technique was utilized including caps, mask, sterile gowns, sterile gloves, sterile drape, hand hygiene and skin antiseptic. A timeout was performed prior to the initiation of the procedure. The right groin was prepped and draped in the usual sterile fashion. Using a micropuncture kit and the modified Seldinger technique, access was gained to the right common femoral artery and an 8 French sheath was placed. Real-time ultrasound guidance was utilized for vascular access including the acquisition of a permanent ultrasound image documenting patency of the accessed vessel. Under fluoroscopy, an 8 [place] Walrus balloon guide catheter was navigated over  a 6 [place] VTK catheter and a 0.035" Terumo Glidewire into the aortic arch. The catheter was placed into the left common carotid artery and then advanced into the left internal carotid artery. The diagnostic catheter was removed. Frontal and lateral angiograms of the head were obtained. FINDINGS: 1. Ultrasound showed heavily calcified right common femoral artery is maintained patency and caliber. 2. Proximal occlusion of a left M2/MCA anterior division branch. 3. Atherosclerotic changes of the intracranial left ICA with mild stenosis at the distal cavernous segment. 4. 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 PROCEDURE: Using biplane roadmap guidance, a Red 62 aspiration catheter was navigated over Colossus 35 microguidewire into the cavernous  segment of the left ICA. The aspiration catheter was then advanced to the level of occlusion and connected to an aspiration pump. Continuous aspiration was performed for 2 minutes. The guide catheter was connected to a VacLok syringe and the guiding catheter balloon was inflated. The aspiration catheter was subsequently removed under constant aspiration. The guide catheter was aspirated for debris. Left internal carotid artery angiograms with frontal and lateral views of the head showed complete recanalization of the left MCA vascular tree. The guide catheter was retracted into the neck. Frontal and lateral angiograms of the neck were obtained. Improvement of the degree of stenosis in the carotid bulb compared to prior CT angiogram. There is prominent luminal irregularity at the carotid bulb residual moderate stenosis. Increased tortuosity of the proximal/mid cervical left ICA with kinking. Left internal carotid artery angiograms with left anterior oblique views of the neck showed evidence of filling defects at the level of the carotid bulb. Flat panel CT of the head was obtained and post processed in a separate workstation with concurrent attending physician supervision. Selected images were sent to PACS. No evidence of hemorrhagic complication. There is mild contrast staining of the left insular and frontal cortex. Repeat left internal carotid artery angiograms with frontal and lateral views of the head showed Amy seen left M3/MCA branch to the left parietal region. Left common carotid artery angiograms with frontal and lateral views of the neck showed vertebra Gretchen of stenosis at the left ICA bulb with more prominent filling defect. At this point, patient was loaded on cangrelor followed by continuous drip. Using biplane roadmap guidance, a 4-7 mm Emboshield NAV6 cerebral protection device was advanced into the cervical left ICA. However, multiple attempts to advance a cerebral protection device through the  left ICA kinking proved unsuccessful. The cerebral protection device was subsequently removed. Using biplane roadmap guidance, a 10-8 x 40 mm XACT carotid stent was navigated and deployed from the distal left common carotid artery to the proximal left internal carotid artery, proximal to the vessel kinking. Suboptimal stent expansion was noted. Then, a 6 x 30 mm Viatrac balloon was navigated into the recently deployed stent. Angioplasty was performed under fluoroscopy. Left internal carotid artery angiograms with frontal and lateral views of the neck showed adequate stent positioning and expansion with brisk anterograde flow. Left internal carotid artery angiograms with frontal and lateral views of the head showed improvement of anterograde flow in the left MCA vascular tree with brisk anterograde flow. Delayed left common carotid artery angiograms with frontal and lateral views of the neck showed no evidence of clot formation within the stent. The catheter was subsequently Riddle. Right common femoral artery angiogram was obtained in right anterior oblique view. The puncture is at  the level of the common femoral artery. The artery has normal caliber, adequate for closure device. The sheath was exchanged over the wire for an 8 [place] Angio-Seal which was utilized for access closure. Immediate hemostasis was achieved. IMPRESSION: 1. Successful mechanical thrombectomy for treatment of a proximal left M2/MCA anterior division branch occlusion achieving complete recanalization (TICI 3). 2. 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 PLAN: Continue cangrelor infusion until patient is transitioned to oral dual antiplatelet therapy. Electronically Signed   By: Baldemar Lenis M.D.   On: 06/05/2023 13:16   CT ANGIO HEAD NECK W WO CM W PERF (CODE STROKE) Addendum  Date: 06/05/2023 ADDENDUM REPORT: 06/05/2023 12:25 ADDENDUM: Please note, there is a dictation error within CTA neck impression #1, which should read: The common carotid and internal carotid arteries are patent within the neck. Atherosclerotic plaque bilaterally. Most notably, there is progressive atherosclerotic plaque about the left carotid bifurcation and within the proximal left ICA with resultant severe near occlusive stenosis of the proximal left ICA. Also of note, atherosclerotic plaque about the right carotid bifurcation results in a 40% stenosis at the right ICA origin. Electronically Signed   By: Jackey Loge D.O.   On: 06/05/2023 12:25   Result Date: 06/05/2023 CLINICAL DATA:  Provided history: Cerebrovascular accident, unspecified mechanism. Right-sided weakness. Right-sided facial droop. Altered mental status. EXAM: CT ANGIOGRAPHY HEAD AND NECK CT PERFUSION BRAIN TECHNIQUE: Multidetector CT imaging of the head and neck was performed using the standard protocol during bolus administration of intravenous contrast. Multiplanar CT image reconstructions and MIPs were obtained to evaluate the vascular anatomy. Carotid stenosis measurements (when applicable) are obtained utilizing NASCET criteria, using the distal internal carotid diameter as the denominator. Multiphase CT imaging of the brain was performed following IV bolus contrast injection. Subsequent parametric perfusion maps were calculated using RAPID software. RADIATION DOSE REDUCTION: This exam was performed according to the departmental dose-optimization program which includes automated exposure control, adjustment of the mA and/or kV according to patient size and/or use of iterative reconstruction technique. CONTRAST:  OMNIPAQUE IOHEXOL 350 MG/ML SOLN COMPARISON:  Noncontrast head CT performed earlier today 06/05/2023. MRA head and MRA neck 10/04/2019. FINDINGS: CTA NECK FINDINGS Aortic arch: Common origin of the innominate and left common  carotid arteries. Atherosclerotic plaque within the visualized thoracic aorta and proximal major branch vessels of the neck. Streak/beam hardening artifact arising from a dense right-sided contrast bolus partially obscures the right subclavian artery. Within this limitation, there is no appreciable hemodynamically significant innominate or proximal subclavian artery stenosis. Right carotid system: CCA and ICA patent within the neck. Atherosclerotic plaque, greatest about the carotid bifurcation. Resultant 40% stenosis at the ICA origin. Left carotid system: CCA and ICA patent within the neck. Atherosclerotic plaque. Most notably, there is prominent atherosclerotic plaque about the carotid bifurcation and within the proximal ICA which has progressed from the prior MRA neck of 10/04/2019. Resultant severe (near occlusive) stenosis of the proximal ICA. Tortuosity of the cervical ICA Vertebral arteries: The vertebral arteries are patent within the neck. Streak/beam hardening artifact limits evaluation of the right vertebral artery origin. At least moderate stenosis is suspected at this site. Atherosclerotic plaque scattered elsewhere within the cervical right vertebral artery with no more than mild stenosis. Calcified atherosclerotic plaque at the left vertebral artery origin with suspected at least moderate stenosis. Nonstenotic calcified plaque elsewhere within the cervical left  vertebral artery. Skeleton: Cervical spondylosis. Other neck: No neck mass or cervical lymphadenopathy. Upper chest: No consolidation within the imaged lung apices. Review of the MIP images confirms the above findings CTA HEAD FINDINGS Anterior circulation: The intracranial internal carotid arteries are patent. As sclerotic plaque within both vessels. No more than mild stenosis on the right. Up to moderate stenosis within the left cavernous segment. The M1 middle cerebral arteries are patent. Abrupt occlusion of a proximal M2 left middle  cerebral artery vessel (series 11, image 22). Atherosclerotic irregularity of the M2 and more distal MCA vessels elsewhere. The anterior cerebral arteries are patent. Atherosclerotic irregularity of both vessels without high-grade proximal stenosis. A possible 2 mm periophthalmic left ICA aneurysm with better appreciated on the prior MRA head of 10/04/2019. Posterior circulation: The intracranial vertebral arteries are patent. Atherosclerotic plaque within the right V4 segment sites of mild stenosis. Non-stenotic atherosclerotic plaque within the left V4 segment. The basilar artery is patent. The posterior cerebral arteries are patent. Posterior communicating arteries are diminutive or absent, bilaterally. Venous sinuses: Assessment for dural venous sinus thrombosis is limited due to contrast timing. Anatomic variants: As described. Review of the MIP images confirms the above findings CT Brain Perfusion Findings: ASPECTS: CBF (<30%) Volume: 41mL Perfusion (Tmax>6.0s) volume: 86mL Mismatch Volume: 45mL Infarction Location:Left MCA vascular territory CTA head impression #1, the CT perfusion head impression and the presence of a severe stenosis of the proximal cervical left ICA called by telephone at the time of interpretation on 06/05/2023 at 8:40 am to provider ERIC [facility] , who verbally acknowledged these results. IMPRESSION: CTA neck: 1. No common carotid and internal carotid arteries are patent within the neck. Atherosclerotic plaque bilaterally. Most notably, there is progressive atherosclerotic plaque about the left carotid bifurcation and within the proximal left ICA with resultant severe, near occlusive stenosis of the proximal left ICA. Also of note, atherosclerotic plaque about the right carotid bifurcation results in 40% stenosis at the right ICA origin. 2. The vertebral arteries are patent within the neck. Atherosclerotic plaque bilaterally as described. Most notably, there is suspected at least moderate  stenoses at the bilateral vertebral artery origins. 3. Aortic Atherosclerosis (ICD10-I70.0). CTA head: 1. Abrupt occlusion of a proximal M2 left middle cerebral artery vessel. 2. Background intracranial atherosclerotic disease as described. 3. A possible 2 mm periophthalmic left ICA aneurysm was better appreciated on the prior MRA head of 10/04/2019. CT perfusion head: The perfusion software identifies a 41 mL core infarct in the left MCA vascular territory. The perfusion software identifies an 86 mL region of critically hypoperfused parenchyma within the left MCA vascular territory (utilizing the Tmax>6 seconds threshold). Reported mismatch volume: 45 mL Electronically Signed: By: Jackey Loge D.O. On: 06/05/2023 09:07   CT HEAD CODE STROKE WO CONTRAST Result Date: 06/05/2023 CLINICAL DATA:  Code stroke. Neuro deficit, acute, stroke suspected. EXAM: CT HEAD WITHOUT CONTRAST TECHNIQUE: Contiguous axial images were obtained from the base of the skull through the vertex without intravenous contrast. RADIATION DOSE REDUCTION: This exam was performed according to the departmental dose-optimization program which includes automated exposure control, adjustment of the mA and/or kV according to patient size and/or use of iterative reconstruction technique. COMPARISON:  Brain MRI 10/04/2019.  Noncontrast head CT 10/03/2019. FINDINGS: Brain: Generalized cerebral atrophy. Loss of gray-white differentiation consistent with an acute infarct within the left insula and within portions of the left frontal operculum (MCA vascular territory). Known small chronic cortically-based infarcts within the left frontal, left parietal and left occipital  lobes were better appreciated on the prior brain MRI of 10/04/2019 (acute at that time). Mild patchy and ill-defined hypoattenuation within the cerebral white matter, nonspecific but compatible with chronic small vessel ischemic disease. Subcentimeter infarct within the superior right  cerebellar hemisphere, new from the prior MRI but chronic in appearance. Loss of gray-white differentiation there is no acute intracranial hemorrhage. No extra-axial fluid collection. No evidence of an intracranial mass. No midline shift. Vascular: No hyperdense vessel.  Atherosclerotic calcifications. Skull: No calvarial fracture or aggressive osseous lesion. Sinuses/Orbits: No mass or acute finding within the imaged orbits. No significant paranasal sinus disease. ASPECTS [facility] Stroke Program Early CT Score) - Ganglionic level infarction (caudate, lentiform nuclei, internal capsule, insula, M1-M3 cortex): 5 - Supraganglionic infarction (M4-M6 cortex): 2 Total score (0-10 with 10 being normal): 7 Impression #1 called by telephone at the time of interpretation on 06/05/2023 at 8:40 am to provider Dr. Otelia Limes, who verbally acknowledged these results. IMPRESSION: 1. Acute left MCA territory infarct affecting the left insula and portions of the left frontal operculum. ASPECTS is 7. 2. Known small chronic cortically-based infarcts within the left frontal, left parietal and left occipital lobes were better appreciated on the prior brain MRI of 10/04/2019 (acute at that time). 3. Background mild cerebral white matter chronic small vessel ischemic disease. 4. Subcentimeter infarct within the right cerebellar hemisphere, new from prior MRI but chronic in appearance. 5. Generalized cerebral atrophy. Electronically Signed   By: Jackey Loge D.O.   On: 06/05/2023 08:45     PHYSICAL EXAM  Temp:  [97.6 F (36.4 C)-101.4 F (38.6 C)] 97.6 F (36.4 C) (02/27 1248) Pulse Rate:  [77-122] 90 (02/27 1248) Resp:  [13-49] 24 (02/27 1248) BP: (82-142)/(31-93) 107/63 (02/27 1248) SpO2:  [80 %-100 %] 93 % (02/27 1248) FiO2 (%):  [40 %-70 %] 70 % (02/27 1030) Weight:  [136.1 kg] 136.1 kg (02/27 0500)  General -intubated elderly patient in no acute distress  Cardiovascular -regular rhythm on monitor  Neuro: Still  intubated and vent dependent.  On Precedex and fentanyl.  He is unresponsive and consistent with a sedated exam.  Keeps eyes closed but will open with movement and repositioning.  With forced eye opening appears to have midline gaze.  No blink to threat on the left inconsistent on the right.  Pupils appear to be reactive bilaterally.  Cough and gag present. Motor: Withdrawal to pain on the left side.     ASSESSMENT/PLAN Mr. Daeton Kluth. is a 83 y.o. male with history of hypertension, diabetes, CKD 3, stroke admitted for right-sided weakness numbness, aphasia, right facial droop, left gaze preference. No TNK given due to outside window.  Patient underwent mechanical thrombectomy with left ICA stenting.   2/15 in the morning patient had an episode of vomiting with coffee-ground emesis and subsequent tachycardia and tachypnea.  On evaluation this morning, he had increased work of breathing and was unable to maintain his SpO2 even on 100% O2 via nonrebreather.  Discussion was had with patient's wife and daughter, and family stated they would like to proceed with intubation and would like for patient to remain full code at this time.  CCM was consulted, and patient was transferred to ICU and intubated.  He did have an episode of aspiration during intubation and bronchoscopy was subsequently performed.  He was noted to be hypotensive after intubation, and Levophed and vasopressin were started.  Will hold Plavix for now given coffee-ground emesis.  Patient's creatinine was also noted  to have worsened. Due to her worsening kidney function and need for CRRT, CCM will be primary on the patient.   Stroke:  left MCA infarct with left M2 occlusion and left ICA near occlusion s/p IR with TICI3 and left ICA stenting, likely secondary to large vessel disease source versus cardiomyopathy with low EF CT left MCA infarct CT head and neck left M2 occlusion, left ICA near occlusion, right ICA 43 stenosis,  bilateral VA origin severe stenosis CTP 41/86 Status post IR with TICI3 and left ICA stenting MRI left MCA infarct at left insular and left frontal operculum, prominent and confluent petechial hemorrhagic transformation CT repeat 2/7 stable confluent petechial hemorrhage 2D Echo EF 30% LDL 107 HgbA1c 6.1 P2 Y12 = 73 UDS negative SCDs for VTE prophylaxis aspirin 81 mg daily and clopidogrel 75 mg daily prior to admission, now on ASA.  Plavix was DC'd today due to low hemoglobin Ongoing aggressive stroke risk factor management Therapy recommendations:  SNF Disposition: Pending   History of stroke 10/08/2019 admitted for left MCA infarct due to right upper extremity weakness.  MRA head and neck showed left ICA 30% stenosis.  EF 45 to 50%.  LDL 105, A1c 5.6.  Recommended loop recorder at that time but only got 30-day CardioNet monitoring which was no A-fib.  Discharged on DAPT and Lipitor 80.  Respiratory failure Leukocytosis On 2/15, patient had episode of emesis with probable subsequent aspiration and went into respiratory distress with increased work of breathing unable to maintain SpO2 After discussion with family, patient intubated for airway protection and given respiratory failure Now on precedex and fentanyl Ventilator management per CCM  Fever Tmax 102.5--101.4--afebrile  WBC 10.1--23.5--44.5--41.4--29.9--19.5--7.4--4.9--5.7-14.3>13.3--15.2 cefepime-> Rocephin course completed Off vancomycin  GI bleeding Acute blood loss anemia Patient had 2 episodes of coffee-ground emesis, likely stress ulcer Resume Plavix 2/18 Resume ASA 2/20  Hemoglobin 11.2-> 8.4->PRBC->8.8->8.1->7.6>8.7>8.0>5.7->PRBC->8.1 Close monitoring Off plavix due to severe anemia, continue aspirin  Cardiomyopathy CHF 09/2019 EF 45 to 50% Current admission EF 30% Cardiology on board, appreciate assistance On aspirin now Agree with GDMT (losartan, entresto, metoprolol and spironolactone) once BP and Cr  tolerates  Diabetes HgbA1c 6.1 goal < 7.0 Hyperglycemia improved  Now off insulin drip CBG monitoring SSI  DM education and close PCP follow up  History of hypertension, now hypotensive  unstable On Levophed and vasopressin Long term BP goal normotensive  Hyperlipidemia Home meds: Lipitor 80 LDL 105, goal < 70 Now on lipitor 80 and Zetia  Continue statin and zetia at discharge  Dysphagia Poststroke dysphagia Speech on board N.p.o. now OG tube reinserted Start trickle feeds 2/18  AKI on CKD  Creatinine 1.78->3.41-> 4.61->4.07-> 3.85->3.66->4.19->5.66 --> 6.92>2.35>2.5>2.32 Baseline creatinine around 1.5 Aggressive treat hypotension Nephrology on board Currently on CCRT  Other Stroke Risk Factors Advanced age Obesity, Body mass index is 43.05 kg/m.   Other Active Problems Presyncopal episode with mild hypotension 2/14-suspect was due to straining to have a bowel movement, will prescribe as needed suppository  Hospital day # 22   Gevena Mart DNP, ACNPC-AG  [facility]  ATTENDING NOTE: I reviewed above note and agree with the assessment and plan. Pt was seen and examined.   Wife at the bedside. Pt still intubated on precedex and fentanyl, eyes closed but slightly open on pain stimulation. However, resisted for forced eye opening, not following commands. With forced eye opening, eyes in mild position, not blinking to visual threat, not tracking, pupils equal size but sluggish to light. Corneal reflex present R  stronger than left, gag and cough present. Breathing over the vent.  Facial symmetry not able to test due to ET tube.  Tongue protrusion not cooperative. On pain stimulation, not moving any extremities. Sensation, coordination and gait not tested   Patient condition did not improve, continue to have renal failure on CRRT, hypotension on 2 pressors, needed sedation for vent dyssynchrony, severe anemia needing 2 unit PRBC today.  Discontinue Plavix,  continue aspirin.  However, fever and leukocytosis improving.  Patient neurologically has not improved.  Again had long discussion with wife at bedside, discussed with possible for prognosis, family agreed with DNR at this time.  Palliative care is also talking to family.  For detailed assessment and plan, please refer to above/below as I have made changes wherever appropriate.   Marvel Plan, MD PhD Stroke Neurology 06/27/2023 6:36 PM  This patient is critically ill due to stroke status post IR, respiratory failure, hypotension needing pressor, AKI, aspiration pneumonia and GI bleeding and at significant risk of neurological worsening, death form sepsis, septic shock, severe anemia, heart failure and renal failure. This patient's care requires constant monitoring of vital signs, hemodynamics, respiratory and cardiac monitoring, review of multiple databases, neurological assessment, discussion with family, other specialists and medical decision making of high complexity. I spent 40 minutes of neurocritical care time in the care of this patient. I had long discussion with wife at bedside, updated pt current condition, treatment plan and potential prognosis, and answered all the questions. She expressed understanding and appreciation.

## 2023-06-27 NOTE — Progress Notes (Signed)
 NAME:  Nathan Hincapie., MRN:  [id number], DOB:  16-Oct-1940, LOS: 22 ADMISSION DATE:  06/05/2023 CONSULTATION DATE:  06/05/2023 REFERRING MD:  Otelia Limes - Neuro, CHIEF COMPLAINT: Code Stroke, post-NIR   History of Present Illness:  83 year old man who presented to [facility] ED 2/5 via EMS for unresponsiveness and R-sided deficits. PMHx significant for HTN, HLD, prior CVA without residual deficits, T2DM, CKD stage IIIb, prostate CA, gout.  Patient presented to [facility] ED with significant R-sided deficits, aphasia and poor responsiveness. LKW 2200. Code Stroke initiated. On ED arrival, patient was afebrile with HR 86, BP 154/67, RR 20, SpO2 100%. Noted aphasia, R-sided hemiplegia, R-sided facial droop. Labs were notable for WBC 6.8. Hgb 11.9, Plt 181. INR 1.0. Na 142, K 3.8, CO2 20, Cr 1.59 (baseline), LFTs WNL. Ethanol < 10. COVID negative. CT Head with acute L MCA territory infarct affecting L insula/L frontal operculum, small chronic cortically-based infarcts of L frontal/L parietal/L occipital lobes. CTA Head/Neck with abrupt occlusion of proximal M2 L MCA vessel, ?2mm periophthalmic L ICA aneurysm, bilateral ICA plaque with progressive atherosclerotic plaque L carotid bifurcation with proximal L ICA severe, near-occlusive stenosis.   Taken to [facility] emergently for mechanical thrombectomy ([name]). TICI 3 revascularization achieved. L ICA stenosis and plaque rupture noted at the bulb, treated with stenting/angioplasty. Extubated post-procedure and maintained on cangrelor gtt with plan for transition to DAPT.  PCCM consulted for post-procedure medical management.  Pertinent Medical History:   Past Medical History:  Diagnosis Date   Anemia of chronic disease    Ankle fracture 02/15/2016   Cancer (HCC)    Prostate   Chronic constipation    Chronic kidney disease    stage III   Diabetes mellitus without complication (HCC)    type II    Diabetic peripheral neuropathy (HCC)    Failure to  thrive (0-17)    Fracture of left lower leg    Gout    Hyperlipidemia    Hypertension    Morbid obesity (HCC)    Unstable gait    Significant Hospital Events: Including procedures, antibiotic start and stop dates in addition to other pertinent events   2/5 - Presented to [facility] ED with poor responsiveness, aphasia, R-sided deficits. CT Head with L MCA territory infarct. CTA Head/Neck with occlusion of L MCA M2 segment and severe near-occlusive stenosis of L ICA. Taken to [facility] for MT, stent and angioplasty ([name]). PCCM consulted for post-procedure management. 2/6 transfer out of ICU 2/15 aspiration event early a.m. resulting in respiratory distress, transferred back to ICU and emergently intubated with development of severe shock requiring epi, levo, vaso. Transfused PRBC x1 for possible GI bleed 2/16: 3 pressors, weaning. Coox without cardiogenic component. Antibiotics vanc/cefepime. CT CAP with SBO  2/18: weaned off to only levo now which is likely sedation related  2/20: UOP drop overnight with ongoing poor renal function. Weaning sedation to evaluate neuro status.  2/22: started on trial of CRRT 2/23: HD catheter exchanged for longer one - flow improved. More awake by afternoon 2/24 tried to wean Precedex and pt got quite uncomfortable/agitated. Required Fentanyl infusion addition. Got hypotensive later in day, increase in NE  2/25 NE back down and almost off. Started pulling 50cc/hr from CRRT 2/26 remains on NE 11, CRRT ongoing 27: levo going up to 24, on vaso. CRRT net even. Made DNR today   Interim History / Subjective:  2U PRBC this AM, adding vaso back on. Breathing looks uncomfortable -  belly breathing. Increasing O2. CXR with worsening R sided disease vs edema, but ongoing fever and shock state. Restarting cefepime.. Increasing semglee. Palliative saw wife this morning and made DNR.   Objective:  Blood pressure (!) 111/47, pulse 86, temperature 97.9 F (36.6 C),  temperature source Axillary, resp. rate (!) 23, height 5\' 10"  (1.778 m), weight (!) 136.1 kg, SpO2 95%.    Vent Mode: PRVC FiO2 (%):  [40 %-60 %] 40 % Set Rate:  [22 bmp] 22 bmp Vt Set:  [580 mL] 580 mL PEEP:  [8 cmH20] 8 cmH20 Plateau Pressure:  [20 cmH20-22 cmH20] 20 cmH20   Intake/Output Summary (Last 24 hours) at 06/27/2023 0751 Last data filed at 06/27/2023 0700 Gross per 24 hour  Intake 4178.36 ml  Output 1937.6 ml  Net 2240.76 ml   Filed Weights   06/25/23 0500 06/26/23 0500 06/27/23 0500  Weight: 134.8 kg 132.4 kg (!) 136.1 kg   Physical Exam: General: elderly male, laying in bed, critically ill appearing  HENT: [place]/AT, anicteric sclera, eyes open spontaneously but do not track, mmm, ett, ogt Pulmonary: diminished bases, peak 28, plat 20, peep 8, belly breathing  Cardiovascular: s1s2, tachycardia, no appreciable murmur, rub, gallop GI: rounded, soft, non-distended Extremities: BLE pitting edema of feet  Neuro: opens eyes spontaneously, blinks to treat (delayed), does not track or follow commands GU: no foley   Ancillary Tests Personally Reviewed:  Hyperglycemic on tube feeds Creatinine 2.80, BUN 54 on CRRT  Assessment & Plan:Resolved Hospital Problem List   Acute hypoxemic respiratory failure due to aspiration Klebsiella pneumonia  - treated.  Ileus and swallowing dysfunction from stroke probably contributed.  Left MCA M2 occlusion and left ICA near occlusion now status post thrombectomy with complete revascularization Likely secondary to large vessel obstruction Heart failure with reduced ejection fraction ejection fraction of 30% with global hypokinesis, normal right ventricular function Hypertension Hyperlipidemia Type 2 diabetes Acute kidney injury superimposed on chronic kidney disease stage IIIb NSVT AM 2/25 Acute blood loss anemia, unclear etiology; may be stress ulcer related vs critical illness. No bloody BM, or OGT output.  Goals of care - poor prognosis  for meaningful recovery and could be headed towards trach and possibly ESRD requiring HD  Plan: - hemoglobin drop overnight to 5.7. getting 2UPRBC. D/c SQ heparin. Con't PPI - Continue full vent support - CXR this AM for increased WOB and FiO2 requirement. Appears to have worsening R lung disease. Worsening shock this morning. Concern for ongoing SIRS/sepsis  - restart cefepime  - SBT as able - not stable enough for SBT 2/27 - on precedex, fentanyl - wean as able  - seroquel 100mg  BID - on levo, vaso for MAP >65, currently net even CRRT. If increase in levo will stop CRRT, have further GOC discussion  - con't CRRT. Net even. Poor long term HD candidate. Anuric.  - CBG q4h, SSI, increasing Lantus to 25mg  BID, TF coverage 6U - Follow BMP, ensure K > 4, Mg > 2 - Continue ASA, Plavix, Statin, zetia  - GDMT not applicable at this time  - Palliative care consulted: Made DNR-Interventions today after discussion with wife.    Best Practice (right click and "Reselect all SmartList Selections" daily)   Diet/type: tubefeeds and NPO DVT prophylaxis DOAC, SCD's Pressure ulcer(s): N/A GI prophylaxis: PPI Lines: Central line and yes and it is still needed Foley:  Yes, and it is still needed Code Status:  DNR Last date of multidisciplinary goals of care discussion: ongoing palliative  discussion. Made DNR today   CC time: 87m   Cristopher Peru, PA-C [facility] 06/27/23 7:53 AM  Please see [email or web address] for pager details.  From 7A-7P if no response, please call [phone] After hours, please call ELink [phone]

## 2023-06-27 NOTE — Progress Notes (Signed)
 Palliative:  HPI: 83 y.o. male with past medical history of HTN, HLD, diabetes, CKD stage 3b, prior CVA with no residual deficits, prostate cancer, failure to thrive admitted on 06/05/2023 with unresponsiveness with right-sided deficits found to have left MCA infarct with left M2 occlusion and left ICA near occlusion s/p IR with mechanical thrombectomy and stenting 2/5. 2/15 aspiration event resulting in severe shock requiring intubation. Hospitalization also complicated by acute GIB and CHF EF 30%.   I met today with Mrs. Horsfall. We reviewed her husband's progression since yesterday and concern that he continues to decline. I expressed my concern for the path he is on and that he is not responding well to our treatments and interventions - he just continues to get worse. We reviewed the hopes that he was showing some level of improvement last week but since I last visited with them last week he has become more and more ill requiring more and more support. We reviewed kidney failure with no signs of improvement, increasing vasopressor needs, and increasing ventilator support. Mrs. Isham and I discussed her faith and her hopes. We reviewed plan of care and she does agree that we should put him through CPR but wishes to continue full scope treatment otherwise. We reviewed that if he declines and his heart stops despite this treatment then we can accept this as God's will. She expresses that she has to continue to hold onto her faith and the hope that God will heal him. However, she is also understanding that we are all mortal and that it may be God's will that it is his time.   Update: I returned to bedside later in the day. Unfortunately Mr. Honor has continued to decline. Discussed with PCCM. I met today with Mrs. Moline again. We reviewed his ongoing decline and I expressed that the medical team has done all they can for her husband - I explained that we do not have any more tools in our toolbox for  Mr Sasso and there is nothing more we can do if he continues to decline. She continues to pray and stands by her decision to continue current care - "do all you can and that is all you can do." She stands by decision for NO CPR.   I also discussed with multiple children and grandchildren. I explained Mr. Rackley progress and we reviewed his critical status. I explained the maximal support we are providing his body but he continues to decline. I explained the signs of accessory muscle usage and signs that his body is failing. I gently explained that he appears to by dying. I encouraged them to contact all family who wishes to visit and see him. They have reached out to other family. One of his daughter's asked me to speak with their brother and explain situation to him.   I spent time with Mrs. Putzier and daughter, Ines Bloomer. Shawn expressed concerns for her mother needing resources to assist with her son who lives in the home. Mr. Carbajal helped him a lot and Mrs. Nordby is going to need some assistance. I will make some phone calls and look into any resources and guidance I can provide for them.   All questions/concerns addressed. Emotional support provided. Discussed with Mertha Baars, PA as well as RN.   Exam: Ill appearing. Eyes open but no tracking. Not following commands today. HR 90s. Increasing vasopressor needs. Increasing ventilator support 100% FiO2 with significant accessory muscle use. Extremities cool to touch.   Plan: -  DNR - no CPR - Wife wishes to continue full scope care (but understands that we believe he is at end of life) - Discussed with RN and charge RN - liberalized visitation given end of life situation (notified family of rotating visitation with a certain number at bedside at one time but can alternate with other family members)  110 min  Yong Channel, NP Palliative Medicine Team Pager [phone] (Please see [email or web address] for schedule) Team Phone [phone]

## 2023-06-27 NOTE — Progress Notes (Signed)
 Patient now on 100% FiO2, 8 PEEP (BP will not tolerate increases). We have maxed out on levo and vasopressin. His dialysis clotted, and has been gentle restarted. I have had another long conversation with wife at bedside and two granddaughters and updated a daughter. The wife would like [place] to continue forward. I have discussed with her options like comfort measures, no escalation. She would like to continue with care at this time. I have added neo and we will revisit. Again, have encouraged her to call family to come visit and anticipate cardiac arrest in the next few hours.

## 2023-06-27 NOTE — Progress Notes (Signed)
 Noted increased WOB this morning, pt desaturated which required fio2 increased to 70%.  CCM Dr Denese Killings aware.  RN in room currently.

## 2023-06-28 DIAGNOSIS — Z7189 Other specified counseling: Secondary | ICD-10-CM | POA: Diagnosis not present

## 2023-06-28 DIAGNOSIS — I639 Cerebral infarction, unspecified: Secondary | ICD-10-CM | POA: Diagnosis not present

## 2023-06-28 DIAGNOSIS — Z515 Encounter for palliative care: Secondary | ICD-10-CM | POA: Diagnosis not present

## 2023-06-28 DIAGNOSIS — I63512 Cerebral infarction due to unspecified occlusion or stenosis of left middle cerebral artery: Principal | ICD-10-CM

## 2023-06-28 DIAGNOSIS — Z711 Person with feared health complaint in whom no diagnosis is made: Secondary | ICD-10-CM | POA: Diagnosis not present

## 2023-06-28 DIAGNOSIS — R579 Shock, unspecified: Secondary | ICD-10-CM | POA: Diagnosis not present

## 2023-06-28 DIAGNOSIS — J9601 Acute respiratory failure with hypoxia: Secondary | ICD-10-CM | POA: Diagnosis not present

## 2023-06-28 LAB — TYPE AND SCREEN
ABO/RH(D): A NEG
Antibody Screen: NEGATIVE
Unit division: 0
Unit division: 0

## 2023-06-28 LAB — MAGNESIUM: Magnesium: 2.4 mg/dL (ref 1.7–2.4)

## 2023-06-28 LAB — COMPREHENSIVE METABOLIC PANEL
ALT: 36 U/L (ref 0–44)
AST: 74 U/L — ABNORMAL HIGH (ref 15–41)
Albumin: 2.5 g/dL — ABNORMAL LOW (ref 3.5–5.0)
Alkaline Phosphatase: 55 U/L (ref 38–126)
Anion gap: 10 (ref 5–15)
BUN: 28 mg/dL — ABNORMAL HIGH (ref 8–23)
CO2: 25 mmol/L (ref 22–32)
Calcium: 9.4 mg/dL (ref 8.9–10.3)
Chloride: 102 mmol/L (ref 98–111)
Creatinine, Ser: 2 mg/dL — ABNORMAL HIGH (ref 0.61–1.24)
GFR, Estimated: 32 mL/min — ABNORMAL LOW (ref >60)
Glucose, Bld: 100 mg/dL — ABNORMAL HIGH (ref 70–99)
Potassium: 4.9 mmol/L (ref 3.5–5.1)
Sodium: 137 mmol/L (ref 135–145)
Total Bilirubin: 0.9 mg/dL (ref 0.0–1.2)
Total Protein: 6.9 g/dL (ref 6.5–8.1)

## 2023-06-28 LAB — RENAL FUNCTION PANEL
Albumin: 1.8 g/dL — ABNORMAL LOW (ref 3.5–5.0)
Anion gap: 8 (ref 5–15)
BUN: 31 mg/dL — ABNORMAL HIGH (ref 8–23)
CO2: 23 mmol/L (ref 22–32)
Calcium: 8.2 mg/dL — ABNORMAL LOW (ref 8.9–10.3)
Chloride: 102 mmol/L (ref 98–111)
Creatinine, Ser: 1.8 mg/dL — ABNORMAL HIGH (ref 0.61–1.24)
GFR, Estimated: 37 mL/min — ABNORMAL LOW (ref >60)
Glucose, Bld: 405 mg/dL — ABNORMAL HIGH (ref 70–99)
Phosphorus: 2.4 mg/dL — ABNORMAL LOW (ref 2.5–4.6)
Potassium: 4.8 mmol/L (ref 3.5–5.1)
Sodium: 133 mmol/L — ABNORMAL LOW (ref 135–145)

## 2023-06-28 LAB — BPAM RBC
Blood Product Expiration Date: 202503072359
Blood Product Expiration Date: 202503082359
ISSUE DATE / TIME: 202502270845
ISSUE DATE / TIME: 202502270845
Unit Type and Rh: 600
Unit Type and Rh: 600

## 2023-06-28 LAB — GLUCOSE, CAPILLARY
Glucose-Capillary: 106 mg/dL — ABNORMAL HIGH (ref 70–99)
Glucose-Capillary: 159 mg/dL — ABNORMAL HIGH (ref 70–99)
Glucose-Capillary: 225 mg/dL — ABNORMAL HIGH (ref 70–99)
Glucose-Capillary: 318 mg/dL — ABNORMAL HIGH (ref 70–99)
Glucose-Capillary: 276 mg/dL — ABNORMAL HIGH (ref 70–99)
Glucose-Capillary: 205 mg/dL — ABNORMAL HIGH (ref 70–99)

## 2023-06-28 LAB — CBC
HCT: 28.1 % — ABNORMAL LOW (ref 39.0–52.0)
Hemoglobin: 8.8 g/dL — ABNORMAL LOW (ref 13.0–17.0)
MCH: 30 pg (ref 26.0–34.0)
MCHC: 31.3 g/dL (ref 30.0–36.0)
MCV: 95.9 fL (ref 80.0–100.0)
Platelets: 209 10*3/uL (ref 150–400)
RBC: 2.93 MIL/uL — ABNORMAL LOW (ref 4.22–5.81)
RDW: 17.5 % — ABNORMAL HIGH (ref 11.5–15.5)
WBC: 19.8 10*3/uL — ABNORMAL HIGH (ref 4.0–10.5)
nRBC: 0.1 % (ref 0.0–0.2)

## 2023-06-28 LAB — PHOSPHORUS: Phosphorus: 3 mg/dL (ref 2.5–4.6)

## 2023-06-28 MED ORDER — INSULIN GLARGINE 100 UNIT/ML ~~LOC~~ SOLN
15.0000 [IU] | Freq: Two times a day (BID) | SUBCUTANEOUS | Status: DC
  Administered 2023-06-28: 15 [IU] via SUBCUTANEOUS
  Filled 2023-06-28 (×3): qty 0.15

## 2023-06-28 MED ORDER — PHENYLEPHRINE CONCENTRATED 100MG/250ML (0.4 MG/ML) INFUSION SIMPLE
0.0000 ug/min | INTRAVENOUS | Status: DC
  Administered 2023-06-28: 70 ug/min via INTRAVENOUS
  Filled 2023-06-28: qty 250

## 2023-06-28 NOTE — Progress Notes (Addendum)
 STROKE TEAM PROGRESS NOTE   SUBJECTIVE (INTERVAL HISTORY)  Intubated on Precedex @ 0.7, Fentanyl @ 225, Versed @ 3, Levophed @  37.5, Vasopressin @ 12.  Plavix D/C'd yesterday due to drop in Hgb (improved to 8.8 today), rcvd 2u PRBC yesterday, on CRRT.  Palliative on board, multiple meeting with family held yesterday. Patient is a DNR-No CPR, family wishes to continue full scope of care.  Thorough discussion held at bedside with wife, granddaughter at bedside other family members on phone as well  OBJECTIVE Temp:  [97.6 F (36.4 C)-100 F (37.8 C)] 98.4 F (36.9 C) (02/28 0800) Pulse Rate:  [77-114] 83 (02/28 0845) Cardiac Rhythm: Normal sinus rhythm (02/28 0800) Resp:  [9-49] 22 (02/28 0845) BP: (64-172)/(28-149) 135/64 (02/28 0845) SpO2:  [80 %-100 %] 96 % (02/28 0845) FiO2 (%):  [40 %-100 %] 40 % (02/28 0736)  Recent Labs  Lab 06/27/23 1533 06/27/23 1919 06/27/23 2315 06/28/23 0310 06/28/23 0739  GLUCAP 142* 118* 88 106* 159*   Recent Labs  Lab 06/24/23 0520 06/24/23 1553 06/25/23 0541 06/25/23 1621 06/26/23 0720 06/26/23 1539 06/27/23 0453 06/27/23 1537 06/28/23 0028 06/28/23 0030  NA 136   < > 135   < > 137 134* 136 138  --  137  K 4.2   < > 5.1   < > 4.5 5.6* 5.1 4.9  --  4.9  CL 102   < > 107   < > 103 102 102 105  --  102  CO2 24   < > 20*   < > 21* 24 24 24   --  25  GLUCOSE 252*   < > 199*   < > 171* 294* 148* 132*  --  100*  BUN 54*   < > 42*   < > 29* 39* 36* 31*  --  28*  CREATININE 2.80*   < > 2.35*   < > 1.97* 2.57* 2.32* 2.03*  --  2.00*  CALCIUM 8.7*   < > 7.9*   < > 8.7* 8.3* 9.0 8.5*  --  9.4  MG 2.4  --  2.4  --  2.5*  --  2.4  --  2.4  --   PHOS 3.0   < > 3.4   < > 2.8 2.8 2.7 3.0  --  3.0   < > = values in this interval not displayed.   Recent Labs  Lab 06/26/23 0720 06/26/23 1539 06/27/23 0453 06/27/23 1537 06/28/23 0030  AST  --   --   --   --  74*  ALT  --   --   --   --  36  ALKPHOS  --   --   --   --  55  BILITOT  --   --   --    --  0.9  PROT  --   --   --   --  6.9  ALBUMIN 2.3* 2.0* 2.4* 2.3* 2.5*    Recent Labs  Lab 06/24/23 2353 06/25/23 0541 06/27/23 0453 06/27/23 1317 06/28/23 0028  WBC  --  14.3* 13.3* 15.2* 19.8*  NEUTROABS  --  11.4*  --   --   --   HGB 8.2* 8.0* 5.7* 8.1* 8.8*  HCT 24.0* 26.3* 19.0* 25.6* 28.1*  MCV  --  100.8* 103.3* 97.3 95.9  PLT  --  132* 157 166 209       Component Value Date/Time   CHOL 183 06/06/2023 0557   TRIG  109 06/19/2023 0031   HDL 60 06/06/2023 0557   CHOLHDL 3.1 06/06/2023 0557   VLDL 16 06/06/2023 0557   LDLCALC 107 (H) 06/06/2023 0557   LDLCALC 223 (H) 05/31/2021 0000   Lab Results  Component Value Date   HGBA1C 6.1 (H) 06/05/2023      Component Value Date/Time   LABOPIA NONE DETECTED 06/05/2023 1653   COCAINSCRNUR NONE DETECTED 06/05/2023 1653   LABBENZ NONE DETECTED 06/05/2023 1653   AMPHETMU NONE DETECTED 06/05/2023 1653   THCU NONE DETECTED 06/05/2023 1653   LABBARB NONE DETECTED 06/05/2023 1653    No results for input(s): "ETH" in the last 168 hours.   DG CHEST PORT 1 VIEW Result Date: 06/27/2023 CLINICAL DATA:  Tachypnea. EXAM: PORTABLE CHEST 1 VIEW COMPARISON:  June 24, 2023. FINDINGS: Stable cardiomediastinal silhouette is noted with interval development of central pulmonary vascular congestion and probable bilateral pulmonary edema. Bibasilar atelectasis is noted with probable pleural effusions, right greater than left. Endotracheal and feeding tubes are in grossly good position. Bilateral internal jugular catheters are unchanged. IMPRESSION: Interval development of central pulmonary vascular congestion and probable bilateral pulmonary edema with associated bibasilar atelectasis and pleural effusions, right greater than left. Electronically Signed   By: Lupita Raider M.D.   On: 06/27/2023 11:36   DG CHEST PORT 1 VIEW Result Date: 06/25/2023 CLINICAL DATA:  ETT placement EXAM: PORTABLE CHEST 1 VIEW COMPARISON:  06/24/2023 FINDINGS:  Endotracheal tube terminates 7 cm above the carina. Patchy bilateral opacities, favoring bibasilar atelectasis. Small left pleural effusion. No pneumothorax. The heart is normal in size. Right IJ venous catheter terminates at the cavoatrial junction. Left IJ venous catheter terminates in the upper SVC. Enteric tube courses into the stomach. IMPRESSION: Endotracheal tube terminates 7 cm above the carina. Patchy bilateral opacities, favoring bibasilar atelectasis. Small left pleural effusion. Electronically Signed   By: Charline Bills M.D.   On: 06/25/2023 02:23   DG Chest Port 1 View Result Date: 06/24/2023 CLINICAL DATA:  Intubated EXAM: PORTABLE CHEST 1 VIEW COMPARISON:  06/24/2023 FINDINGS: Single frontal view of the chest image straits endotracheal tube overlying tracheal air column, tip at the level of the thoracic inlet approximately 8.5 cm above carina. Enteric catheter passes below diaphragm tip excluded by collimation. Bilateral internal jugular catheters are unchanged, distal margins projecting over the superior vena cava. Cardiac silhouette is stable. Stable bibasilar consolidation and bilateral pleural effusions. No pneumothorax. No acute bony abnormalities. IMPRESSION: 1. Endotracheal tube tip at the thoracic inlet, approximately 8.5 cm above carina. 2. Other support devices as above. 3. Persistent bibasilar consolidation and effusions. Electronically Signed   By: Sharlet Salina M.D.   On: 06/24/2023 22:43   DG CHEST PORT 1 VIEW Result Date: 06/23/2023 CLINICAL DATA:  Central line placement EXAM: PORTABLE CHEST 1 VIEW COMPARISON:  06/22/2023 FINDINGS: RIGHT central venous line with tip in distal SVC. LEFT central venous line with tip in the mid SVC. Endotracheal tube and feeding tube unchanged. No pneumothorax. Bilateral small effusions. Low lung volumes. IMPRESSION: 1. Bilateral central venous lines without complication. 2. Low lung volumes and small effusions. Electronically Signed   By:  Genevive Bi M.D.   On: 06/23/2023 10:10   DG Chest Port 1 View Result Date: 06/22/2023 CLINICAL DATA:  Central line placement. EXAM: PORTABLE CHEST 1 VIEW COMPARISON:  June 19, 2023. FINDINGS: Stable cardiomediastinal silhouette. Hypoinflation of the lungs is noted with bibasilar subsegmental atelectasis and probable small pleural effusions. Endotracheal tube and feeding tube are unchanged. Left  internal jugular catheter is unchanged. Interval placement of right internal jugular catheter with distal tip in expected position of the SVC. No pneumothorax. IMPRESSION: Interval placement of right internal jugular catheter with distal tip in expected position of the SVC. Electronically Signed   By: Lupita Raider M.D.   On: 06/22/2023 11:36   DG CHEST PORT 1 VIEW Result Date: 06/19/2023 CLINICAL DATA:  Respiratory distress. EXAM: PORTABLE CHEST 1 VIEW COMPARISON:  Radiograph 06/15/2023, CT 06/16/2023 FINDINGS: Tip of the endotracheal tube 4.5 cm from the carina. Enteric tube and left central line unchanged in positioning. Slight improved right lung aeration with persistent basilar opacity. Slight worsening patchy opacity at the left lung base. No pneumothorax or large pleural effusion. No pulmonary edema. Stable heart size and mediastinal contours. IMPRESSION: 1. Bibasilar opacities, improving on the right but worsening on the left. 2. Stable support apparatus. Electronically Signed   By: Narda Rutherford M.D.   On: 06/19/2023 16:02   DG Abd Portable 1V Result Date: 06/18/2023 CLINICAL DATA:  Orogastric tube placement. EXAM: PORTABLE ABDOMEN - 1 VIEW COMPARISON:  Radiographs 06/05/2023.  Abdominal CT 06/16/2023. FINDINGS: 1236 hours. Tip of the enteric tube projects over the right upper quadrant of the abdomen, likely in the distal stomach. There is a small amount contrast material within the stomach. The visualized bowel gas pattern is normal. Patient is rotated to the right with persistent right  basilar airspace disease and a small right pleural effusion. IMPRESSION: Enteric tube tip projects over the distal stomach. Electronically Signed   By: Carey Bullocks M.D.   On: 06/18/2023 14:48   CT CHEST ABDOMEN PELVIS WO CONTRAST Result Date: 06/16/2023 CLINICAL DATA:  Septicemia.  Pneumonia. EXAM: CT CHEST, ABDOMEN AND PELVIS WITHOUT CONTRAST TECHNIQUE: Multidetector CT imaging of the chest, abdomen and pelvis was performed following the standard protocol without IV contrast. RADIATION DOSE REDUCTION: This exam was performed according to the departmental dose-optimization program which includes automated exposure control, adjustment of the mA and/or kV according to patient size and/or use of iterative reconstruction technique. COMPARISON:  CT angio chest from 04/04/2016. FINDINGS: CT CHEST FINDINGS Cardiovascular: Heart size is normal. Aortic atherosclerosis and multi vessel coronary artery calcifications. No pericardial effusion. Mediastinum/Nodes: Thyroid gland, trachea and esophagus appear normal. ET tube is in place with tip above the carina. There is a enteric tube with tip in the stomach.No enlarged mediastinal lymph nodes. Hilar lymph nodes are suboptimally evaluated due to lack of IV contrast. Lungs/Pleura: No pleural effusion or interstitial edema. Bilateral lower lobe airspace consolidation is identified, right greater than left. Imaging findings compatible with multifocal pneumonia. Chronic subsegmental atelectasis this is within the right middle lobe and right lower lobe with asymmetric volume loss and elevation of the right hemidiaphragm. Musculoskeletal: No chest wall mass or suspicious bone lesions identified. CT ABDOMEN PELVIS FINDINGS Hepatobiliary: No focal liver abnormality. Gallbladder appears normal. No signs of bile duct dilatation. Pancreas: Unremarkable. No pancreatic ductal dilatation or surrounding inflammatory changes. Spleen: Normal in size without focal abnormality.  Adrenals/Urinary Tract: Normal adrenal glands. Multiple stones identified within the upper pole of the left kidney which measure up to 7 mm. Bilateral exophytic kidney lesions of varying complexity are identified. These are suboptimally assessed on the current exam reflecting lack of IV contrast material. No signs of hydronephrosis. Urinary bladder is decompressed around a Foley catheter. Stomach/Bowel: Enteric tube tip is in the proximal stomach. There is retained enteric contrast material within the gastric fundus. The appendix is visualized and appears  normal. There are several dilated loops of small bowel within the right hemiabdomen. The proximal small bowel loops are normal in caliber. Within the right upper quadrant of the abdomen there are multiple dilated loops of small bowel measuring up to 3.5 cm containing air-fluid levels. The bowel loops proximal and distal are normal in caliber. Within the limitations of unenhanced technique there is no significant bowel wall thickening. Distal colonic diverticulosis identified without signs of acute diverticulitis. Retained enteric contrast material identified within the colon up to the rectum Vascular/Lymphatic: Aortic atherosclerosis without aneurysm. No signs of abdominopelvic adenopathy. Reproductive: Prostate gland is either atrophic or surgically absent. Penile prosthesis identified. Other: No free fluid or fluid collections. Musculoskeletal: No acute or significant osseous findings. IMPRESSION: 1. Bilateral lower lobe airspace consolidation, right greater than left. Imaging findings compatible with multifocal pneumonia. 2. Chronic subsegmental atelectasis within the right middle lobe and right lower lobe with asymmetric volume loss and elevation of the right hemidiaphragm. 3. Assessment of bowel pathology is limited due to lack of IV and enteric contrast material. There are multiple abnormally dilated loops of small bowel within the right hemiabdomen. Findings  are compatible with a small-bowel obstruction. As there is normal caliber proximal small bowel and normal caliber distal small bowel findings are concerning for either close loop obstruction or internal hernia. 4. Distal colonic diverticulosis without signs of acute diverticulitis. 5. Nonobstructing left renal calculi. 6. Bilateral exophytic kidney lesions of varying complexity are identified. These are suboptimally assessed on the current exam reflecting lack of IV contrast material. Further evaluation with nonemergent renal ultrasound is advised. 7.  Aortic Atherosclerosis (ICD10-I70.0). Electronically Signed   By: Signa Kell M.D.   On: 06/16/2023 15:51   ECHOCARDIOGRAM LIMITED Result Date: 06/15/2023    ECHOCARDIOGRAM LIMITED REPORT   Patient Name:   Nathan Proctor. Date of Exam: 06/15/2023 Medical Rec #:  [id number]              Height:       70.0 in Accession #:    [id number]             Weight:       263.9 lb Date of Birth:  [birth year]              BSA:          2.348 m Patient Age:    83 years               BP:           121/53 mmHg Patient Gender: M                      HR:           90 bpm. Exam Location:  Inpatient Procedure: Limited Echo, Color Doppler and Intracardiac Opacification Agent            (Both Spectral and Color Flow Doppler were utilized during            procedure). Indications:    Stroke i63.9, Shock  History:        Patient has prior history of Echocardiogram examinations, most                 recent 06/05/2023. CHF; Risk Factors:Hypertension and                 Dyslipidemia.  Sonographer:    Irving Burton Senior RDCS Referring Phys: [id number] [facility] Mechele Collin  Sonographer Comments:  Technically difficult due to body habitus, scanned supine on artificial repirator IMPRESSIONS  1. Very difficult to determine LVEF to limited visualization and frequent ectopy. . Left ventricular ejection fraction, by estimation, is 40 to 45%. The left ventricle has mildly decreased function. Left ventricular  endocardial border not optimally defined to evaluate regional wall motion.  2. Right ventricular systolic function was not well visualized. The right ventricular size is not well visualized.  3. The inferior vena cava is normal in size with <50% respiratory variability, suggesting right atrial pressure of 8 mmHg.  4. Limited echo FINDINGS  Left Ventricle: Very difficult to determine LVEF to limited visualization and frequent ectopy. Left ventricular ejection fraction, by estimation, is 40 to 45%. The left ventricle has mildly decreased function. Left ventricular endocardial border not optimally defined to evaluate regional wall motion. Definity contrast agent was given IV to delineate the left ventricular endocardial borders. Right Ventricle: The right ventricular size is not well visualized. Right vetricular wall thickness was not well visualized. Right ventricular systolic function was not well visualized. Venous: The inferior vena cava is normal in size with less than 50% respiratory variability, suggesting right atrial pressure of 8 mmHg. Additional Comments: Color Doppler performed.  Dina Rich MD Electronically signed by Dina Rich MD Signature Date/Time: 06/15/2023/4:31:38 PM    Final    DG Abd 1 View Result Date: 06/15/2023 CLINICAL DATA:  Orogastric tube placement. EXAM: ABDOMEN - 1 VIEW COMPARISON:  Radiograph 06/08/2023 FINDINGS: Tip of the enteric tube below the diaphragm in the stomach, the side port is just beyond the gastroesophageal junction. There is gaseous gastric distension. Dilated bowel in the right abdomen up 4.7 cm, potentially small bowel, but incompletely included in the field of view. IMPRESSION: 1. Tip of the enteric tube below the diaphragm in the stomach, side-port just beyond the gastroesophageal junction. 2. Gaseous gastric distension. Additional dilated bowel in the right abdomen likely small bowel, although incompletely included in the field of view. Electronically  Signed   By: Narda Rutherford M.D.   On: 06/15/2023 15:06   DG CHEST PORT 1 VIEW Result Date: 06/15/2023 CLINICAL DATA:  10031 Cough 10031 EXAM: PORTABLE CHEST 1 VIEW COMPARISON:  June 08, 2023 FINDINGS: The cardiomediastinal silhouette is unchanged in contour.Unchanged elevation of the RIGHT hemidiaphragm. No pleural effusion. No pneumothorax. Bibasilar platelike opacities. Atherosclerotic calcifications. Similar background of interstitial prominence. IMPRESSION: Bibasilar platelike opacities, favored to reflect atelectasis. Electronically Signed   By: Meda Klinefelter M.D.   On: 06/15/2023 12:07   DG CHEST PORT 1 VIEW Result Date: 06/15/2023 CLINICAL DATA:  Respiratory failure, intubation and central line placement. EXAM: PORTABLE CHEST 1 VIEW COMPARISON:  Film earlier today at 0734 hours FINDINGS: Interval intubation with the endotracheal tube tip approximately 3.5 cm above the carina. Left jugular central line placement with the line tip in the upper SVC. No pneumothorax. Volume loss of the right lung remains with elevation of the right hemidiaphragm. Improved expansion of the left lung. Potential underlying mild pulmonary interstitial edema. No significant pleural effusions. IMPRESSION: 1. Interval intubation and left jugular central line placement. No pneumothorax. 2. Improved expansion of the left lung. Persistent volume loss of the right lung with elevation of the right hemidiaphragm. Potential underlying mild pulmonary interstitial edema. Electronically Signed   By: Irish Lack M.D.   On: 06/15/2023 10:42   DG Swallowing Func-Speech Pathology Result Date: 06/12/2023 Table formatting from the original result was not included. Modified Barium Swallow Study Patient Details Name: Nathan Proctor  Annye English. MRN: [id number] Date of Birth: 18-Jun-1940 Today's Date: 06/12/2023 HPI/PMH: HPI: Pt is a 83 y.o. male who was admitted as a code stroke on 02/05 due to acute onset of right-sided weakness, right  facial droop and aphasia. MRI from 02/06 displayed an evolving acute left MCA infarct, most pronounced at the left insula and left frontal operculum. Pt has intentionally lost 300 pounds in the recent past, with pre-loss weight at ~600Ib. PMHx includes hypertension, diabetes, CKD, prior CVA. Clinical Impression: Clinical Impression: Pt presents with a mild-moderate oropharyngeal dysphagia (DIGEST Score: 1) primarily characterized by silent penetration of thin liquid at level of VFs, flash penetration of nectar-thick above VFs, and diffuse pharyngeal residue. Pt's dysphagia severity level dropped from DIGEST score of 3 to 1, compared to previous MBS eval on 02/07.     Pharyngeal residue was present throughout all consistencies. Pt's posture for swallowing was consistently neutral. Pt follows commands for compensatory strategies more readily now with max multimodal cueing. Effective compensatory strategies include multiple swallows, throat clearing, and a straw when used with a verbally cued slow rate. Multiple consecutive sips of thin-liquid presented the most risk for aspiration due to silent penetration of Vfs. Unlike previous MBS study, pt tolerated solid consistency, displaying prolonged mastication but majority clearance of bolus after 2-3 swallows.     Recommendations include upgrading pt's diet to dysphagia 2 (finely chopped) and thin liquids to optimize safety and efficiency for swallowing. SLP f/u to monitor tolerance of thin-liquids with compensatory strategies is highly recommended. Pt needs to remain upright for 30 minutes after PO intake due to remaining pharyngeal residue to optimize airway safety. Factors that may increase risk of adverse event in presence of aspiration [name] & Clearance [name] 2021): Factors that may increase risk of adverse event in presence of aspiration [name] & Clearance [name] 2021): Dependence for feeding and/or oral hygiene Recommendations/Plan: Swallowing Evaluation Recommendations  Swallowing Evaluation Recommendations Recommendations: PO diet PO Diet Recommendation: Dysphagia 2 (Finely chopped); Thin liquids (Level 0) Liquid Administration via: Straw Medication Administration: Crushed with puree Supervision: Full supervision/cueing for swallowing strategies; Full assist for feeding Swallowing strategies  : Slow rate; Small bites/sips; Multiple dry swallows after each bite/sip; Clear throat intermittently Postural changes: Position pt fully upright for meals; Stay upright 30-60 min after meals Oral care recommendations: Oral care BID (2x/day); Staff/trained caregiver to provide oral care Treatment Plan Treatment Plan Follow-up recommendations: Follow physicians's recommendations for discharge plan and follow up therapies Functional status assessment: Patient has had a recent decline in their functional status and demonstrates the ability to make significant improvements in function in a reasonable and predictable amount of time. Recommendations Recommendations for follow up therapy are one component of a multi-disciplinary discharge planning process, led by the attending physician.  Recommendations may be updated based on patient status, additional functional criteria and insurance authorization. Assessment: Orofacial Exam: Orofacial Exam Oral Cavity: Oral Hygiene: WFL Oral Cavity - Dentition: Adequate natural dentition Orofacial Anatomy: WFL Anatomy: Anatomy: WFL Boluses Administered: Boluses Administered Boluses Administered: Thin liquids (Level 0); Mildly thick liquids (Level 2, nectar thick); Moderately thick liquids (Level 3, honey thick); Puree; Solid  Oral Impairment Domain: Oral Impairment Domain Lip Closure: Interlabial escape, no progression to anterior lip Tongue control during bolus hold: Cohesive bolus between tongue to palatal seal Bolus preparation/mastication: Slow prolonged chewing/mashing with complete recollection Bolus transport/lingual motion: Brisk tongue motion Oral  residue: Residue collection on oral structures Location of oral residue : Tongue Initiation of pharyngeal swallow : Pyriform sinuses  Pharyngeal  Impairment Domain: Pharyngeal Impairment Domain Soft palate elevation: No bolus between soft palate (SP)/pharyngeal wall (PW) Laryngeal elevation: Partial superior movement of thyroid cartilage/partial approximation of arytenoids to epiglottic petiole Anterior hyoid excursion: Complete anterior movement Epiglottic movement: Complete inversion Laryngeal vestibule closure: Incomplete, narrow column air/contrast in laryngeal vestibule Pharyngeal stripping wave : Present - complete Pharyngeal contraction (A/P view only): N/A Pharyngoesophageal segment opening: Complete distension and complete duration, no obstruction of flow Tongue base retraction: Trace column of contrast or air between tongue base and PPW Pharyngeal residue: Collection of residue within or on pharyngeal structures Location of pharyngeal residue: Diffuse (>3 areas)  Esophageal Impairment Domain: Esophageal Impairment Domain Esophageal clearance upright position: -- (Not tested) Pill: No data recorded Penetration/Aspiration Scale Score: Penetration/Aspiration Scale Score 1.  Material does not enter airway: Solid; Puree; Moderately thick liquids (Level 3, honey thick) 2.  Material enters airway, remains ABOVE vocal cords then ejected out: Mildly thick liquids (Level 2, nectar thick) 5.  Material enters airway, CONTACTS cords and not ejected out: Thin liquids (Level 0) Compensatory Strategies: Compensatory Strategies Compensatory strategies: Yes Straw: Effective Effective Straw: Mildly thick liquid (Level 2, nectar thick) (Effective with slow rate applied.) Multiple swallows: Effective Effective Multiple Swallows: Thin liquid (Level 0); Mildly thick liquid (Level 2, nectar thick); Moderately thick liquid (Level 3, honey thick); Puree; Solid   General Information: Caregiver present: No  Diet Prior to this Study:  Dysphagia 1 (pureed); Moderately thick liquids (Level 3, honey thick)   Temperature : Normal   Respiratory Status: WFL   Supplemental O2: None (Room air)   History of Recent Intubation: Yes  Behavior/Cognition: Alert; Cooperative; Pleasant mood; Requires cueing Self-Feeding Abilities: Needs assist with self-feeding Baseline vocal quality/speech: Normal Volitional Cough: Unable to elicit Volitional Swallow: Able to elicit Exam Limitations: No limitations Goal Planning: Prognosis for improved oropharyngeal function: Good Barriers to Reach Goals: Language deficits No data recorded No data recorded No data recorded Pain: Pain Assessment Pain Assessment: No/denies pain Pain Score: 0 End of Session: Start Time:SLP Start Time (ACUTE ONLY): 1310 Stop Time: SLP Stop Time (ACUTE ONLY): 1335 Time Calculation:SLP Time Calculation (min) (ACUTE ONLY): 25 min Charges: SLP Evaluations $ SLP Speech Visit: 1 Visit SLP Evaluations $MBS Swallow: 1 Procedure $Swallowing Treatment: 1 Procedure SLP visit diagnosis: SLP Visit Diagnosis: Dysphagia, oropharyngeal phase (R13.12) Past Medical History: Past Medical History: Diagnosis Date  Anemia of chronic disease   Ankle fracture 02/15/2016  Cancer (HCC)   Prostate  Chronic constipation   Chronic kidney disease   stage III  Diabetes mellitus without complication (HCC)   type II   Diabetic peripheral neuropathy (HCC)   Failure to thrive (0-17)   Fracture of left lower leg   Gout   Hyperlipidemia   Hypertension   Morbid obesity (HCC)   Unstable gait  Past Surgical History: Past Surgical History: Procedure Laterality Date  IR CT HEAD LTD  06/05/2023  IR INTRAVSC STENT CERV CAROTID W/O EMB-PROT MOD SED INC ANGIO  06/05/2023  IR PERCUTANEOUS ART THROMBECTOMY/INFUSION INTRACRANIAL INC DIAG ANGIO  06/05/2023  IR US GUIDE VASC ACCESS RIGHT  06/05/2023  ORIF ANKLE FRACTURE Left 02/29/2016  Procedure: OPEN REDUCTION INTERNAL FIXATION (ORIF) ANKLE FRACTURE;  Surgeon: Yolonda Kida, MD;  Location: [facility];   Service: Orthopedics;  Laterality: Left;  PROSTATE SURGERY    RADIOLOGY WITH ANESTHESIA N/A 06/05/2023  Procedure: IR WITH ANESTHESIA;  Surgeon: Radiologist, Medication, MD;  Location: [facility];  Service: Radiology;  Laterality: N/A; DeBlois, Riley Nearing 06/12/2023, 3:16  PM  DG Abd 1 View Result Date: 06/09/2023 CLINICAL DATA:  [id number]. Encounter for feeding tube placement. EXAM: ABDOMEN - 1 VIEW COMPARISON:  Abdomen film 06/05/2023. FINDINGS: Dobbhoff feeding tube is well placed with the radiopaque tip in the distal stomach. The visualized bowel pattern is nonobstructive. There is barium newly seen in the flexures and transverse colon. There is no supine evidence of free air. There is atelectasis in the lung bases. Cardiomegaly. IMPRESSION: 1. Dobbhoff feeding tube well placed with the radiopaque tip in the distal stomach. 2. Nonobstructive bowel gas pattern. 3. Cardiomegaly. Electronically Signed   By: Almira Bar M.D.   On: 06/09/2023 03:41   DG CHEST PORT 1 VIEW Result Date: 06/08/2023 CLINICAL DATA:  CHF EXAM: PORTABLE CHEST 1 VIEW COMPARISON:  01/06/2023 FINDINGS: Feeding tube in place. There is cardiomegaly with vascular congestion. Chronic elevation of the right hemidiaphragm with right base atelectasis. Interstitial prominence throughout the lungs, right greater than left could reflect interstitial edema. Possible small effusions. No acute bony abnormality. IMPRESSION: Cardiomegaly with vascular congestion and probable mild interstitial edema. Question small bilateral effusions. Chronic elevation of the right hemidiaphragm with right base atelectasis. Electronically Signed   By: Charlett Nose M.D.   On: 06/08/2023 18:20   DG Swallowing Func-Speech Pathology Result Date: 06/07/2023 Table formatting from the original result was not included. Modified Barium Swallow Study Study completed and documented by [name], SLP Student Supervised and reviewed by Harlon Ditty, MA CCC-SLP Acute  Rehabilitation Services Secure Chat Preferred Office [phone] Patient Details Name: Fortune Brannigan. MRN: [id number] Date of Birth: [birth year] Today's Date: 06/07/2023 HPI/PMH: HPI: Pt is a 83 y.o. male who was admitted as a code stroke on 02/05 due to acute onset of right-sided weakness, right facial droop and aphasia. MRI from 02/06 displayed an evolving acute left MCA infarct, most pronounced at the left insula and left frontal operculum. Pt has intentionally lost 300 pounds in the recent past, with pre-loss weight at ~600Ib. PMHx includes hypertension, diabetes, CKD, prior CVA. Clinical Impression: Clinical Impression: Pt presented with a moderate-severe oropharyngeal dysphagia (DIGEST Score: 3) primarily characterized by silent aspiration of thin liquid, silent penetration of nectar-thick liquid, diffuse pharyngeal residue collection during thin/nectar-thick consistencies, and posterior-escape of bolus across multiple consistencies. Pt positioning was a potential limitation for examination due to consistent posterior head position. More anterior head positioning during nectar-thick liquid displayed less pharyngeal residue, increased hyo-laryngeal excursion, and overall better airway safety. This positioning was not present throughout due to pt's receptive language deficits to understand cues. Alternating between honey-thick and puree consistencies provided more oropharyngeal bolus clearance and no signs of aspiration. Recommend a dysphagia 1 (puree) and honey-thick liquid diet to optimize pt's airway safety and potential for meeting nutritional needs. Slow rate, small bites/sips, natural head posturing, and multiple swallows are compensatory strategies that displayed best efficiency with pt. F/u with SLP for family education, compensatory strategy training, and upgraded diet trials is recommended. Factors that may increase risk of adverse event in presence of aspiration [name] & Clearance [name] 2021): Factors  that may increase risk of adverse event in presence of aspiration [name] & Clearance [name] 2021): Weak cough Recommendations/Plan: Swallowing Evaluation Recommendations Swallowing Evaluation Recommendations Recommendations: PO diet PO Diet Recommendation: Dysphagia 1 (Pureed); Moderately thick liquids (Level 3, honey thick) Liquid Administration via: Spoon Medication Administration: Crushed with puree Supervision: Full supervision/cueing for swallowing strategies; Full assist for feeding Swallowing strategies  : Slow rate; Small bites/sips; Multiple dry swallows after each bite/sip (Pt needs  to be in a neutral head position (no posterior head tilt).) Postural changes: Position pt fully upright for meals; Stay upright 30-60 min after meals Oral care recommendations: Oral care BID (2x/day); Staff/trained caregiver to provide oral care Caregiver Recommendations: Have oral suction available Treatment Plan Treatment Plan Treatment recommendations: Therapy as outlined in treatment plan below Follow-up recommendations: Follow physicians's recommendations for discharge plan and follow up therapies Functional status assessment: Patient has had a recent decline in their functional status and demonstrates the ability to make significant improvements in function in a reasonable and predictable amount of time. Treatment frequency: Min 2x/week Treatment duration: 2 weeks Interventions: Aspiration precaution training; Compensatory techniques; Patient/family education; Diet toleration management by SLP; Trials of upgraded texture/liquids Recommendations Recommendations for follow up therapy are one component of a multi-disciplinary discharge planning process, led by the attending physician.  Recommendations may be updated based on patient status, additional functional criteria and insurance authorization. Assessment: Orofacial Exam: Orofacial Exam Oral Cavity - Dentition: Adequate natural dentition Orofacial Anatomy: WFL Anatomy: Anatomy:  WFL Boluses Administered: Boluses Administered Boluses Administered: Thin liquids (Level 0); Mildly thick liquids (Level 2, nectar thick); Moderately thick liquids (Level 3, honey thick); Puree; Solid  Oral Impairment Domain: Oral Impairment Domain Lip Closure: Escape progressing to mid-chin Tongue control during bolus hold: Posterior escape of greater than half of bolus Bolus preparation/mastication: -- (Pt did not chew solid and SLP had to remove it from mouth.) Bolus transport/lingual motion: Repetitive/disorganized tongue motion Oral residue: Residue collection on oral structures Location of oral residue : Tongue; Palate Initiation of pharyngeal swallow : Pyriform sinuses  Pharyngeal Impairment Domain: Pharyngeal Impairment Domain Soft palate elevation: No bolus between soft palate (SP)/pharyngeal wall (PW) Laryngeal elevation: Partial superior movement of thyroid cartilage/partial approximation of arytenoids to epiglottic petiole Anterior hyoid excursion: Partial anterior movement Epiglottic movement: Partial inversion Laryngeal vestibule closure: Incomplete, narrow column air/contrast in laryngeal vestibule Pharyngeal stripping wave : Present - complete Pharyngeal contraction (A/P view only): N/A Pharyngoesophageal segment opening: Complete distension and complete duration, no obstruction of flow Tongue base retraction: Narrow column of contrast or air between tongue base and PPW Pharyngeal residue: Collection of residue within or on pharyngeal structures Location of pharyngeal residue: Diffuse (>3 areas)  Esophageal Impairment Domain: Esophageal Impairment Domain Esophageal clearance upright position: -- (Not tested.) Pill: No data recorded Penetration/Aspiration Scale Score: Penetration/Aspiration Scale Score 1.  Material does not enter airway: Puree; Moderately thick liquids (Level 3, honey thick) 3.  Material enters airway, remains ABOVE vocal cords and not ejected out: Mildly thick liquids (Level 2,  nectar thick) 8.  Material enters airway, passes BELOW cords without attempt by patient to eject out (silent aspiration) : Thin liquids (Level 0) Compensatory Strategies: Compensatory Strategies Compensatory strategies: Yes Straw: Ineffective Multiple swallows: Effective (Required max cueing due to pt's language.) Effective Multiple Swallows: Puree; Moderately thick liquid (Level 3, honey thick); Mildly thick liquid (Level 2, nectar thick)   General Information: Caregiver present: No  Diet Prior to this Study: NPO   Temperature : Normal   Respiratory Status: WFL   Supplemental O2: None (Room air)   No data recorded Behavior/Cognition: Alert; Cooperative; Pleasant mood; Requires cueing Self-Feeding Abilities: Needs assist with self-feeding Baseline vocal quality/speech: Dysphonic Volitional Cough: Unable to elicit Volitional Swallow: Able to elicit Exam Limitations: Poor positioning Goal Planning: Prognosis for improved oropharyngeal function: Good Barriers to Reach Goals: Language deficits No data recorded No data recorded Consulted and agree with results and recommendations: Pt unable/family or caregiver not  available Pain: Pain Assessment Pain Assessment: No/denies pain Faces Pain Scale: 0 Facial Expression: 0 Body Movements: 0 Muscle Tension: 0 Compliance with ventilator (intubated pts.): N/A Vocalization (extubated pts.): 0 CPOT Total: 0 End of Session: Start Time:SLP Start Time (ACUTE ONLY): 0903 Stop Time: SLP Stop Time (ACUTE ONLY): 0930 Time Calculation:SLP Time Calculation (min) (ACUTE ONLY): 27 min Charges: SLP Evaluations $ SLP Speech Visit: 1 Visit SLP Evaluations $BSS Swallow: 1 Procedure $MBS Swallow: 1 Procedure $ SLP EVAL LANGUAGE/SOUND PRODUCTION: 1 Procedure $Swallowing Treatment: 1 Procedure SLP visit diagnosis: SLP Visit Diagnosis: Dysphagia, oropharyngeal phase (R13.12) Past Medical History: Past Medical History: Diagnosis Date  Anemia of chronic disease   Ankle fracture 02/15/2016  Cancer (HCC)    Prostate  Chronic constipation   Chronic kidney disease   stage III  Diabetes mellitus without complication (HCC)   type II   Diabetic peripheral neuropathy (HCC)   Failure to thrive (0-17)   Fracture of left lower leg   Gout   Hyperlipidemia   Hypertension   Morbid obesity (HCC)   Unstable gait  Past Surgical History: Past Surgical History: Procedure Laterality Date  IR CT HEAD LTD  06/05/2023  IR INTRAVSC STENT CERV CAROTID W/O EMB-PROT MOD SED INC ANGIO  06/05/2023  IR PERCUTANEOUS ART THROMBECTOMY/INFUSION INTRACRANIAL INC DIAG ANGIO  06/05/2023  IR US GUIDE VASC ACCESS RIGHT  06/05/2023  ORIF ANKLE FRACTURE Left 02/29/2016  Procedure: OPEN REDUCTION INTERNAL FIXATION (ORIF) ANKLE FRACTURE;  Surgeon: Yolonda Kida, MD;  Location: [facility];  Service: Orthopedics;  Laterality: Left;  PROSTATE SURGERY    RADIOLOGY WITH ANESTHESIA N/A 06/05/2023  Procedure: IR WITH ANESTHESIA;  Surgeon: Radiologist, Medication, MD;  Location: [facility];  Service: Radiology;  Laterality: N/A; DeBlois, Riley Nearing 06/07/2023, 12:15 PM  CT HEAD WO CONTRAST ( ) Result Date: 06/07/2023 CLINICAL DATA:  83 year old male status post code stroke presentation, left MCA M2 occlusion, with left MCA infarct with confluent petechial hemorrhage. EXAM: CT HEAD WITHOUT CONTRAST TECHNIQUE: Contiguous axial images were obtained from the base of the skull through the vertex without intravenous contrast. RADIATION DOSE REDUCTION: This exam was performed according to the departmental dose-optimization program which includes automated exposure control, adjustment of the mA and/or kV according to patient size and/or use of iterative reconstruction technique. COMPARISON:  Brain MRI yesterday.  Head CT 06/05/2023. FINDINGS: Brain: Confluent mixed density infarct in the left MCA middle division, epicenter at the insula and operculum. Confluent petechial hemorrhage on series 3, image 16 appears stable from the MRI. Superimposed trace subarachnoid blood is  difficult to exclude by CT (series 3, image 13), but was not apparent on MRI. Stable mild mass effect on the left lateral ventricle with only trace rightward midline shift (series 3, image 16). No ventriculomegaly. Elsewhere gray-white differentiation is stable from the presentation CT. Basilar cisterns remain patent. Vascular: Resolved hyperdense left MCA since presentation. Calcified atherosclerosis at the skull base. Skull: Stable, intact. Sinuses/Orbits: Visualized paranasal sinuses and mastoids are stable and well aerated. Other: Left nasoenteric tube now in place. Stable orbit and scalp soft tissues. IMPRESSION: 1. Stable by CT confluent Left MCA middle division infarct with petechial hemorrhage (Heidelberg classification 1b: HI2, confluent petechiae, no mass effect). Questionable trace superimposed SAH, but was not apparent on MRI. 2. Stable minimal intracranial mass effect. 3. No new intracranial abnormality. Electronically Signed   By: Odessa Fleming M.D.   On: 06/07/2023 05:37   MR BRAIN WO CONTRAST Result Date: 06/06/2023 CLINICAL DATA:  Follow-up examination for  stroke. EXAM: MRI HEAD WITHOUT CONTRAST TECHNIQUE: Multiplanar, multiecho pulse sequences of the brain and surrounding structures were obtained without intravenous contrast. COMPARISON:  Comparison made to multiple previous exams from 06/05/2023. FINDINGS: Brain: Examination degraded by motion artifact. Cerebral volume within normal limits for age. Patchy T2/FLAIR hyperintensity involving the periventricular deep white matter both cerebral hemispheres, consistent with chronic small vessel ischemic disease, mild in nature. Small remote infarct noted within the right cerebellum. Confluent restricted diffusion involving the left insula and left frontal operculum, consistent with evolving acute left MCA distribution infarct. Area of infarction measures up to approximately 6 cm in AP diameter. Prominent associated susceptibility artifact, consistent with  petechial hemorrhage (series 7, image 61) ( Heidelberg classification 1b: HI2, confluent petechiae, no mass effect. No frank or organized hematoma evident by MRI. No significant regional mass effect. Few additional small foci of infarction noted within the left parieto-occipital region posteriorly (series 3, images 40, 29). Apparent diffusion signal along the right frontal parafalcine region felt to be consistent with artifact related to dural calcification. Note made of a few additional chronic micro hemorrhages within the left cerebellum and right thalamus. No mass lesion or midline shift. Ventricles normal size without hydrocephalus. No extra-axial fluid collection. Pituitary gland suprasellar region within normal limits. Vascular: Major intracranial vascular flow voids are maintained. Skull and upper cervical spine: Craniocervical junction within normal limits. Bone marrow signal intensity overall within normal limits. No scalp soft tissue abnormality. Sinuses/Orbits: Prior bilateral ocular lens replacement. Paranasal sinuses are clear. No mastoid effusion. Other: None. IMPRESSION: 1. Evolving acute left MCA distribution infarct, most pronounced at the left insula and left frontal operculum. Prominent associated petechial hemorrhage without frank intraparenchymal hematoma (Heidelberg classification 1b: HI2, confluent petechiae, no mass effect). 2. Few additional small foci of acute infarction within the left parieto-occipital region posteriorly. 3. Underlying mild chronic microvascular ischemic disease with small remote right cerebellar infarct. Electronically Signed   By: Rise Mu M.D.   On: 06/06/2023 03:12   ECHOCARDIOGRAM COMPLETE Result Date: 06/05/2023    ECHOCARDIOGRAM REPORT   Patient Name:   Nathan Proctor. Date of Exam: 06/05/2023 Medical Rec #:  [id number]              Height:       70.0 in Accession #:    [id number]             Weight:       292.3 lb Date of Birth:  Jun 19, 1940               BSA:          2.453 m Patient Age:    82 years               BP:           129/72 mmHg Patient Gender: M                      HR:           89 bpm. Exam Location:  Inpatient Procedure: 2D Echo, Color Doppler, Cardiac Doppler and Intracardiac            Opacification Agent Indications:    Stroke I63.9  History:        Patient has prior history of Echocardiogram examinations, most                 recent 10/06/2019. Risk Factors:Diabetes, Hypertension and  Dyslipidemia.  Sonographer:    Harriette Bouillon RDCS Referring Phys: Lynnae January IMPRESSIONS  1. Left ventricular ejection fraction, by estimation, is 30%. The left ventricle has moderately decreased function. The left ventricle demonstrates global hypokinesis. There is mild left ventricular hypertrophy.  2. Right ventricular systolic function is normal. The right ventricular size is normal.  3. Trivial mitral valve regurgitation.  4. The aortic valve is tricuspid. Aortic valve regurgitation is not visualized.  5. The inferior vena cava is normal in size with greater than 50% respiratory variability, suggesting right atrial pressure of 3 mmHg. Comparison(s): The left ventricular function is worsened. FINDINGS  Left Ventricle: Left ventricular ejection fraction, by estimation, is 30%. The left ventricle has moderately decreased function. The left ventricle demonstrates global hypokinesis. Definity contrast agent was given IV to delineate the left ventricular endocardial borders. The left ventricular internal cavity size was normal in size. There is mild left ventricular hypertrophy. Right Ventricle: The right ventricular size is normal. Right vetricular wall thickness was not assessed. Right ventricular systolic function is normal. Left Atrium: Left atrial size was normal in size. Right Atrium: Right atrial size was normal in size. Pericardium: There is no evidence of pericardial effusion. Mitral Valve: There is mild thickening of the mitral valve  leaflet(s). Mild to moderate mitral annular calcification. Trivial mitral valve regurgitation. Tricuspid Valve: The tricuspid valve is normal in structure. Tricuspid valve regurgitation is trivial. Aortic Valve: The aortic valve is tricuspid. Aortic valve regurgitation is not visualized. Pulmonic Valve: The pulmonic valve was normal in structure. Pulmonic valve regurgitation is not visualized. Aorta: The aortic root and ascending aorta are structurally normal, with no evidence of dilitation. Venous: The inferior vena cava is normal in size with greater than 50% respiratory variability, suggesting right atrial pressure of 3 mmHg. IAS/Shunts: The interatrial septum was not assessed.  LEFT VENTRICLE PLAX 2D LVIDd:         5.00 cm   Diastology LVIDs:         4.40 cm   LV e' lateral: 5.33 cm/s LV PW:         1.20 cm LV IVS:        1.10 cm LVOT diam:     2.30 cm LV SV:         55 LV SV Index:   23 LVOT Area:     4.15 cm  RIGHT VENTRICLE RV S prime:     14.40 cm/s LEFT ATRIUM         Index LA diam:    4.60 cm 1.88 cm/m  AORTIC VALVE LVOT Vmax:   65.50 cm/s LVOT Vmean:  45.200 cm/s LVOT VTI:    0.133 m  AORTA Ao Root diam: 3.00 cm Ao Asc diam:  3.50 cm  SHUNTS Systemic VTI:  0.13 m Systemic Diam: 2.30 cm Dietrich Pates MD Electronically signed by Dietrich Pates MD Signature Date/Time: 06/05/2023/9:33:38 PM    Final    DG Abd Portable 1V Result Date: 06/05/2023 CLINICAL DATA:  Feeding tube placement. EXAM: PORTABLE ABDOMEN - 1 VIEW COMPARISON:  None Available. FINDINGS: Distal tip of feeding tube is seen in expected position of distal stomach. IMPRESSION: Distal tip of feeding tube seen in expected position of distal stomach. Electronically Signed   By: Lupita Raider M.D.   On: 06/05/2023 15:49   CT HEAD WO CONTRAST Result Date: 06/05/2023 CLINICAL DATA:  Stroke, follow-up. Status post intracranial mechanical thrombectomy and stenting of the left ICA bifurcation. EXAM: CT HEAD  WITHOUT CONTRAST TECHNIQUE: Contiguous axial  images were obtained from the base of the skull through the vertex without intravenous contrast. RADIATION DOSE REDUCTION: This exam was performed according to the departmental dose-optimization program which includes automated exposure control, adjustment of the mA and/or kV according to patient size and/or use of iterative reconstruction technique. COMPARISON:  CT head without contrast and CT angio head and neck 06/05/2023. By plain CT 06/05/2023. FINDINGS: Brain: The study is mildly degraded by patient motion. The left insular and left opercular infarct is somewhat obscured by patient motion. The cortex is slightly hyperdense, potentially reflecting reperfusion. No hemorrhage is present. Basal ganglia are intact. Subcortical white matter hypoattenuation in the high left frontal lobe is new. Mild right-sided white matter disease is stable. Basal ganglia are intact. A remote lacunar infarct is again noted in the right cerebellum. The brainstem and cerebellum are otherwise within normal limits. Vascular: Atherosclerotic calcifications are present within the cavernous internal carotid arteries and at the normal origin of both vertebral arteries. No hyperdense vessel is present. Skull: Calvarium is intact. No focal lytic or blastic lesions are present. No significant extracranial soft tissue lesion is present. Sinuses/Orbits: The paranasal sinuses and mastoid air cells are clear. Bilateral lens replacements are noted. Globes and orbits are otherwise unremarkable. IMPRESSION: 1. The left insular and left opercular infarct is somewhat obscured by patient motion. 2. The cortex is slightly hyperdense, potentially reflecting reperfusion. 3. No hemorrhage. 4. Subcortical white matter hypoattenuation in the high left frontal lobe is new. This may reflect ischemic changes or edema related to the reperfusion. 5. Remote lacunar infarct of the right cerebellum. 6. Stable mild right-sided white matter disease. This likely  reflects the sequela of chronic microvascular ischemia. Electronically Signed   By: Marin Roberts M.D.   On: 06/05/2023 14:38   IR PERCUTANEOUS ART THROMBECTOMY/INFUSION INTRACRANIAL INC DIAG ANGIO Result Date: 06/05/2023 INDICATION: 83 year old male presenting with right-sided weakness and aphasia; NIHSS 28. His last known well was 10 p.m. on 06/04/2023. His past medical history significant for prior stroke, hypertension, diabetes and chronic kidney disease; baseline modified Rankin scale 0. Head CT showed hypodensity within the left insula, basal ganglia and left frontal operculum (ASPECTS 7). No IV thrombolytic given as patient was outside the window. CT angiogram of the head and neck showed an occlusion of a left M2/MCA anterior division branch. CT perfusion showed a 41 mL core infarct with a 45 mL ischemic penumbra. She was transferred to our service for mechanical thrombectomy. EXAM: ULTRASOUND-GUIDED VASCULAR ACCESS DIAGNOSTIC CEREBRAL ANGIOGRAM MECHANICAL THROMBECTOMY FLAT PANEL HEAD CT LEFT CAROTID STENTING AND ANGIOPLASTY WITHOUT CEREBRAL PROTECTION DEVICE COMPARISON:  CT/CT angiogram of the head and neck June 05, 2023. MEDICATIONS: No antibiotics administered. ANESTHESIA/SEDATION: The procedure was performed under general anesthesia. CONTRAST:  80 mL of Omnipaque 300 milligram/mL FLUOROSCOPY: Radiation Exposure Index (as provided by the fluoroscopic device): 1315 mGy Kerma COMPLICATIONS: None immediate. TECHNIQUE: Informed written consent was obtained from the patient's wife after a thorough discussion of the procedural risks, benefits and alternatives. All questions were addressed. Maximal Sterile Barrier Technique was utilized including caps, mask, sterile gowns, sterile gloves, sterile drape, hand hygiene and skin antiseptic. A timeout was performed prior to the initiation of the procedure. The right groin was prepped and draped in the usual sterile fashion. Using a micropuncture kit and  the modified Seldinger technique, access was gained to the right common femoral artery and an 8 French sheath was placed. Real-time ultrasound guidance was utilized for vascular access  including the acquisition of a permanent ultrasound image documenting patency of the accessed vessel. Under fluoroscopy, an 8 [place] Walrus balloon guide catheter was navigated over a 6 [place] VTK catheter and a 0.035" Terumo Glidewire into the aortic arch. The catheter was placed into the left common carotid artery and then advanced into the left internal carotid artery. The diagnostic catheter was removed. Frontal and lateral angiograms of the head were obtained. FINDINGS: 1. Ultrasound showed heavily calcified right common femoral artery is maintained patency and caliber. 2. Proximal occlusion of a left M2/MCA anterior division branch. 3. Atherosclerotic changes of the intracranial left ICA with mild stenosis at the distal cavernous segment. 4. 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 PROCEDURE: Using biplane roadmap guidance, a Red 62 aspiration catheter was navigated over Colossus 35 microguidewire into the cavernous segment of the left ICA. The aspiration catheter was then advanced to the level of occlusion and connected to an aspiration pump. Continuous aspiration was performed for 2 minutes. The guide catheter was connected to a VacLok syringe and the guiding catheter balloon was inflated. The aspiration catheter was subsequently removed under constant aspiration. The guide catheter was aspirated for debris. Left internal carotid artery angiograms with frontal and lateral views of the head showed complete recanalization of the left MCA vascular tree. The guide catheter was retracted into the neck. Frontal and lateral angiograms of the neck were obtained. Improvement of the degree of stenosis in the carotid bulb compared  to prior CT angiogram. There is prominent luminal irregularity at the carotid bulb residual moderate stenosis. Increased tortuosity of the proximal/mid cervical left ICA with kinking. Left internal carotid artery angiograms with left anterior oblique views of the neck showed evidence of filling defects at the level of the carotid bulb. Flat panel CT of the head was obtained and post processed in a separate workstation with concurrent attending physician supervision. Selected images were sent to PACS. No evidence of hemorrhagic complication. There is mild contrast staining of the left insular and frontal cortex. Repeat left internal carotid artery angiograms with frontal and lateral views of the head showed Amy seen left M3/MCA branch to the left parietal region. Left common carotid artery angiograms with frontal and lateral views of the neck showed vertebra Gretchen of stenosis at the left ICA bulb with more prominent filling defect. At this point, patient was loaded on cangrelor followed by continuous drip. Using biplane roadmap guidance, a 4-7 mm Emboshield NAV6 cerebral protection device was advanced into the cervical left ICA. However, multiple attempts to advance a cerebral protection device through the left ICA kinking proved unsuccessful. The cerebral protection device was subsequently removed. Using biplane roadmap guidance, a 10-8 x 40 mm XACT carotid stent was navigated and deployed from the distal left common carotid artery to the proximal left internal carotid artery, proximal to the vessel kinking. Suboptimal stent expansion was noted. Then, a 6 x 30 mm Viatrac balloon was navigated into the recently deployed stent. Angioplasty was performed under fluoroscopy. Left internal carotid artery angiograms with frontal and lateral views of the neck showed adequate stent positioning and expansion with brisk anterograde flow. Left internal carotid artery angiograms with frontal and lateral views of the head  showed improvement of anterograde flow in the left MCA vascular tree with brisk anterograde flow. Delayed left common carotid artery angiograms with frontal and lateral views of the neck showed no evidence of  clot formation within the stent. The catheter was subsequently Riddle. Right common femoral artery angiogram was obtained in right anterior oblique view. The puncture is at the level of the common femoral artery. The artery has normal caliber, adequate for closure device. The sheath was exchanged over the wire for an 8 [place] Angio-Seal which was utilized for access closure. Immediate hemostasis was achieved. IMPRESSION: 1. Successful mechanical thrombectomy for treatment of a proximal left M2/MCA anterior division branch occlusion achieving complete recanalization (TICI 3). 2. 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 PLAN: Continue cangrelor infusion until patient is transitioned to oral dual antiplatelet therapy. Electronically Signed   By: Baldemar Lenis M.D.   On: 06/05/2023 13:16   IR US Guide Vasc Access Right Result Date: 06/05/2023 INDICATION: 83 year old male presenting with right-sided weakness and aphasia; NIHSS 28. His last known well was 10 p.m. on 06/04/2023. His past medical history significant for prior stroke, hypertension, diabetes and chronic kidney disease; baseline modified Rankin scale 0. Head CT showed hypodensity within the left insula, basal ganglia and left frontal operculum (ASPECTS 7). No IV thrombolytic given as patient was outside the window. CT angiogram of the head and neck showed an occlusion of a left M2/MCA anterior division branch. CT perfusion showed a 41 mL core infarct with a 45 mL ischemic penumbra. She was transferred to our service for mechanical thrombectomy. EXAM: ULTRASOUND-GUIDED VASCULAR ACCESS  DIAGNOSTIC CEREBRAL ANGIOGRAM MECHANICAL THROMBECTOMY FLAT PANEL HEAD CT LEFT CAROTID STENTING AND ANGIOPLASTY WITHOUT CEREBRAL PROTECTION DEVICE COMPARISON:  CT/CT angiogram of the head and neck June 05, 2023. MEDICATIONS: No antibiotics administered. ANESTHESIA/SEDATION: The procedure was performed under general anesthesia. CONTRAST:  80 mL of Omnipaque 300 milligram/mL FLUOROSCOPY: Radiation Exposure Index (as provided by the fluoroscopic device): 1315 mGy Kerma COMPLICATIONS: None immediate. TECHNIQUE: Informed written consent was obtained from the patient's wife after a thorough discussion of the procedural risks, benefits and alternatives. All questions were addressed. Maximal Sterile Barrier Technique was utilized including caps, mask, sterile gowns, sterile gloves, sterile drape, hand hygiene and skin antiseptic. A timeout was performed prior to the initiation of the procedure. The right groin was prepped and draped in the usual sterile fashion. Using a micropuncture kit and the modified Seldinger technique, access was gained to the right common femoral artery and an 8 French sheath was placed. Real-time ultrasound guidance was utilized for vascular access including the acquisition of a permanent ultrasound image documenting patency of the accessed vessel. Under fluoroscopy, an 8 [place] Walrus balloon guide catheter was navigated over a 6 [place] VTK catheter and a 0.035" Terumo Glidewire into the aortic arch. The catheter was placed into the left common carotid artery and then advanced into the left internal carotid artery. The diagnostic catheter was removed. Frontal and lateral angiograms of the head were obtained. FINDINGS: 1. Ultrasound showed heavily calcified right common femoral artery is maintained patency and caliber. 2. Proximal occlusion of a left M2/MCA anterior division branch. 3. Atherosclerotic changes of the intracranial left ICA with mild stenosis at the distal cavernous segment. 4. A 2-3  mm laterally projecting saccular aneurysm of the cavernous segment of the left ICA (extradural), similar to prior MR angiogram performed 2021. PROCEDURE: Using biplane roadmap guidance, a Red 62 aspiration catheter was navigated over Colossus 35 microguidewire into the cavernous segment of the left ICA. The aspiration catheter was then advanced to the level  of occlusion and connected to an aspiration pump. Continuous aspiration was performed for 2 minutes. The guide catheter was connected to a VacLok syringe and the guiding catheter balloon was inflated. The aspiration catheter was subsequently removed under constant aspiration. The guide catheter was aspirated for debris. Left internal carotid artery angiograms with frontal and lateral views of the head showed complete recanalization of the left MCA vascular tree. The guide catheter was retracted into the neck. Frontal and lateral angiograms of the neck were obtained. Improvement of the degree of stenosis in the carotid bulb compared to prior CT angiogram. There is prominent luminal irregularity at the carotid bulb residual moderate stenosis. Increased tortuosity of the proximal/mid cervical left ICA with kinking. Left internal carotid artery angiograms with left anterior oblique views of the neck showed evidence of filling defects at the level of the carotid bulb. Flat panel CT of the head was obtained and post processed in a separate workstation with concurrent attending physician supervision. Selected images were sent to PACS. No evidence of hemorrhagic complication. There is mild contrast staining of the left insular and frontal cortex. Repeat left internal carotid artery angiograms with frontal and lateral views of the head showed Amy seen left M3/MCA branch to the left parietal region. Left common carotid artery angiograms with frontal and lateral views of the neck showed vertebra Gretchen of stenosis at the left ICA bulb with more prominent filling defect.  At this point, patient was loaded on cangrelor followed by continuous drip. Using biplane roadmap guidance, a 4-7 mm Emboshield NAV6 cerebral protection device was advanced into the cervical left ICA. However, multiple attempts to advance a cerebral protection device through the left ICA kinking proved unsuccessful. The cerebral protection device was subsequently removed. Using biplane roadmap guidance, a 10-8 x 40 mm XACT carotid stent was navigated and deployed from the distal left common carotid artery to the proximal left internal carotid artery, proximal to the vessel kinking. Suboptimal stent expansion was noted. Then, a 6 x 30 mm Viatrac balloon was navigated into the recently deployed stent. Angioplasty was performed under fluoroscopy. Left internal carotid artery angiograms with frontal and lateral views of the neck showed adequate stent positioning and expansion with brisk anterograde flow. Left internal carotid artery angiograms with frontal and lateral views of the head showed improvement of anterograde flow in the left MCA vascular tree with brisk anterograde flow. Delayed left common carotid artery angiograms with frontal and lateral views of the neck showed no evidence of clot formation within the stent. The catheter was subsequently Riddle. Right common femoral artery angiogram was obtained in right anterior oblique view. The puncture is at the level of the common femoral artery. The artery has normal caliber, adequate for closure device. The sheath was exchanged over the wire for an 8 [place] Angio-Seal which was utilized for access closure. Immediate hemostasis was achieved. IMPRESSION: 1. Successful mechanical thrombectomy for treatment of a proximal left M2/MCA anterior division branch occlusion achieving complete recanalization (TICI 3). 2. Atherosclerotic disease of the left carotid bifurcation with stenosis and clot formation suggesting acute plaque rupture treated with stenting and angioplasty  with resolution of stenosis. PLAN: Continue cangrelor infusion until patient is transitioned to oral dual antiplatelet therapy. Electronically Signed   By: Baldemar Lenis M.D.   On: 06/05/2023 13:16   IR INTRAVSC STENT CERV CAROTID W/O EMB-PROT MOD SED Result Date: 06/05/2023 INDICATION: 83 year old male presenting with right-sided weakness and aphasia; NIHSS 28. His last known well was 10  p.m. on 06/04/2023. His past medical history significant for prior stroke, hypertension, diabetes and chronic kidney disease; baseline modified Rankin scale 0. Head CT showed hypodensity within the left insula, basal ganglia and left frontal operculum (ASPECTS 7). No IV thrombolytic given as patient was outside the window. CT angiogram of the head and neck showed an occlusion of a left M2/MCA anterior division branch. CT perfusion showed a 41 mL core infarct with a 45 mL ischemic penumbra. She was transferred to our service for mechanical thrombectomy. EXAM: ULTRASOUND-GUIDED VASCULAR ACCESS DIAGNOSTIC CEREBRAL ANGIOGRAM MECHANICAL THROMBECTOMY FLAT PANEL HEAD CT LEFT CAROTID STENTING AND ANGIOPLASTY WITHOUT CEREBRAL PROTECTION DEVICE COMPARISON:  CT/CT angiogram of the head and neck June 05, 2023. MEDICATIONS: No antibiotics administered. ANESTHESIA/SEDATION: The procedure was performed under general anesthesia. CONTRAST:  80 mL of Omnipaque 300 milligram/mL FLUOROSCOPY: Radiation Exposure Index (as provided by the fluoroscopic device): 1315 mGy Kerma COMPLICATIONS: None immediate. TECHNIQUE: Informed written consent was obtained from the patient's wife after a thorough discussion of the procedural risks, benefits and alternatives. All questions were addressed. Maximal Sterile Barrier Technique was utilized including caps, mask, sterile gowns, sterile gloves, sterile drape, hand hygiene and skin antiseptic. A timeout was performed prior to the initiation of the procedure. The right groin was prepped and draped  in the usual sterile fashion. Using a micropuncture kit and the modified Seldinger technique, access was gained to the right common femoral artery and an 8 French sheath was placed. Real-time ultrasound guidance was utilized for vascular access including the acquisition of a permanent ultrasound image documenting patency of the accessed vessel. Under fluoroscopy, an 8 [place] Walrus balloon guide catheter was navigated over a 6 [place] VTK catheter and a 0.035" Terumo Glidewire into the aortic arch. The catheter was placed into the left common carotid artery and then advanced into the left internal carotid artery. The diagnostic catheter was removed. Frontal and lateral angiograms of the head were obtained. FINDINGS: 1. Ultrasound showed heavily calcified right common femoral artery is maintained patency and caliber. 2. Proximal occlusion of a left M2/MCA anterior division branch. 3. Atherosclerotic changes of the intracranial left ICA with mild stenosis at the distal cavernous segment. 4. 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 PROCEDURE: Using biplane roadmap guidance, a Red 62 aspiration catheter was navigated over Colossus 35 microguidewire into the cavernous segment of the left ICA. The aspiration catheter was then advanced to the level of occlusion and connected to an aspiration pump. Continuous aspiration was performed for 2 minutes. The guide catheter was connected to a VacLok syringe and the guiding catheter balloon was inflated. The aspiration catheter was subsequently removed under constant aspiration. The guide catheter was aspirated for debris. Left internal carotid artery angiograms with frontal and lateral views of the head showed complete recanalization of the left MCA vascular tree. The guide catheter was retracted into the neck. Frontal and lateral angiograms of the neck were obtained.  Improvement of the degree of stenosis in the carotid bulb compared to prior CT angiogram. There is prominent luminal irregularity at the carotid bulb residual moderate stenosis. Increased tortuosity of the proximal/mid cervical left ICA with kinking. Left internal carotid artery angiograms with left anterior oblique views of the neck showed evidence of filling defects at the level of the carotid bulb. Flat panel CT of the head was obtained and post processed in a separate workstation with concurrent attending physician supervision.  Selected images were sent to PACS. No evidence of hemorrhagic complication. There is mild contrast staining of the left insular and frontal cortex. Repeat left internal carotid artery angiograms with frontal and lateral views of the head showed Amy seen left M3/MCA branch to the left parietal region. Left common carotid artery angiograms with frontal and lateral views of the neck showed vertebra Gretchen of stenosis at the left ICA bulb with more prominent filling defect. At this point, patient was loaded on cangrelor followed by continuous drip. Using biplane roadmap guidance, a 4-7 mm Emboshield NAV6 cerebral protection device was advanced into the cervical left ICA. However, multiple attempts to advance a cerebral protection device through the left ICA kinking proved unsuccessful. The cerebral protection device was subsequently removed. Using biplane roadmap guidance, a 10-8 x 40 mm XACT carotid stent was navigated and deployed from the distal left common carotid artery to the proximal left internal carotid artery, proximal to the vessel kinking. Suboptimal stent expansion was noted. Then, a 6 x 30 mm Viatrac balloon was navigated into the recently deployed stent. Angioplasty was performed under fluoroscopy. Left internal carotid artery angiograms with frontal and lateral views of the neck showed adequate stent positioning and expansion with brisk anterograde flow. Left internal  carotid artery angiograms with frontal and lateral views of the head showed improvement of anterograde flow in the left MCA vascular tree with brisk anterograde flow. Delayed left common carotid artery angiograms with frontal and lateral views of the neck showed no evidence of clot formation within the stent. The catheter was subsequently Riddle. Right common femoral artery angiogram was obtained in right anterior oblique view. The puncture is at the level of the common femoral artery. The artery has normal caliber, adequate for closure device. The sheath was exchanged over the wire for an 8 [place] Angio-Seal which was utilized for access closure. Immediate hemostasis was achieved. IMPRESSION: 1. Successful mechanical thrombectomy for treatment of a proximal left M2/MCA anterior division branch occlusion achieving complete recanalization (TICI 3). 2. 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 PLAN: Continue cangrelor infusion until patient is transitioned to oral dual antiplatelet therapy. Electronically Signed   By: Baldemar Lenis M.D.   On: 06/05/2023 13:16   IR CT Head Ltd Result Date: 06/05/2023 INDICATION: 83 year old male presenting with right-sided weakness and aphasia; NIHSS 28. His last known well was 10 p.m. on 06/04/2023. His past medical history significant for prior stroke, hypertension, diabetes and chronic kidney disease; baseline modified Rankin scale 0. Head CT showed hypodensity within the left insula, basal ganglia and left frontal operculum (ASPECTS 7). No IV thrombolytic given as patient was outside the window. CT angiogram of the head and neck showed an occlusion of a left M2/MCA anterior division branch. CT perfusion showed a 41 mL core infarct with a 45 mL ischemic penumbra. She was transferred to our service for mechanical  thrombectomy. EXAM: ULTRASOUND-GUIDED VASCULAR ACCESS DIAGNOSTIC CEREBRAL ANGIOGRAM MECHANICAL THROMBECTOMY FLAT PANEL HEAD CT LEFT CAROTID STENTING AND ANGIOPLASTY WITHOUT CEREBRAL PROTECTION DEVICE COMPARISON:  CT/CT angiogram of the head and neck June 05, 2023. MEDICATIONS: No antibiotics administered. ANESTHESIA/SEDATION: The procedure was performed under general anesthesia. CONTRAST:  80 mL of Omnipaque 300 milligram/mL FLUOROSCOPY: Radiation Exposure Index (as provided by the fluoroscopic device): 1315 mGy Kerma COMPLICATIONS: None immediate. TECHNIQUE: Informed written consent was obtained from the patient's wife after a thorough discussion of  the procedural risks, benefits and alternatives. All questions were addressed. Maximal Sterile Barrier Technique was utilized including caps, mask, sterile gowns, sterile gloves, sterile drape, hand hygiene and skin antiseptic. A timeout was performed prior to the initiation of the procedure. The right groin was prepped and draped in the usual sterile fashion. Using a micropuncture kit and the modified Seldinger technique, access was gained to the right common femoral artery and an 8 French sheath was placed. Real-time ultrasound guidance was utilized for vascular access including the acquisition of a permanent ultrasound image documenting patency of the accessed vessel. Under fluoroscopy, an 8 [place] Walrus balloon guide catheter was navigated over a 6 [place] VTK catheter and a 0.035" Terumo Glidewire into the aortic arch. The catheter was placed into the left common carotid artery and then advanced into the left internal carotid artery. The diagnostic catheter was removed. Frontal and lateral angiograms of the head were obtained. FINDINGS: 1. Ultrasound showed heavily calcified right common femoral artery is maintained patency and caliber. 2. Proximal occlusion of a left M2/MCA anterior division branch. 3. Atherosclerotic changes of the intracranial left ICA with  mild stenosis at the distal cavernous segment. 4. 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 PROCEDURE: Using biplane roadmap guidance, a Red 62 aspiration catheter was navigated over Colossus 35 microguidewire into the cavernous segment of the left ICA. The aspiration catheter was then advanced to the level of occlusion and connected to an aspiration pump. Continuous aspiration was performed for 2 minutes. The guide catheter was connected to a VacLok syringe and the guiding catheter balloon was inflated. The aspiration catheter was subsequently removed under constant aspiration. The guide catheter was aspirated for debris. Left internal carotid artery angiograms with frontal and lateral views of the head showed complete recanalization of the left MCA vascular tree. The guide catheter was retracted into the neck. Frontal and lateral angiograms of the neck were obtained. Improvement of the degree of stenosis in the carotid bulb compared to prior CT angiogram. There is prominent luminal irregularity at the carotid bulb residual moderate stenosis. Increased tortuosity of the proximal/mid cervical left ICA with kinking. Left internal carotid artery angiograms with left anterior oblique views of the neck showed evidence of filling defects at the level of the carotid bulb. Flat panel CT of the head was obtained and post processed in a separate workstation with concurrent attending physician supervision. Selected images were sent to PACS. No evidence of hemorrhagic complication. There is mild contrast staining of the left insular and frontal cortex. Repeat left internal carotid artery angiograms with frontal and lateral views of the head showed Amy seen left M3/MCA branch to the left parietal region. Left common carotid artery angiograms with frontal and lateral views of the neck showed vertebra Gretchen of stenosis at  the left ICA bulb with more prominent filling defect. At this point, patient was loaded on cangrelor followed by continuous drip. Using biplane roadmap guidance, a 4-7 mm Emboshield NAV6 cerebral protection device was advanced into the cervical left ICA. However, multiple attempts to advance a cerebral protection device through the left ICA kinking proved unsuccessful. The cerebral protection device was subsequently removed. Using biplane roadmap guidance, a 10-8 x 40 mm XACT carotid stent was navigated and deployed from the distal left common carotid artery to the proximal left internal carotid artery, proximal to the vessel kinking. Suboptimal stent expansion was noted. Then, a 6  x 30 mm Viatrac balloon was navigated into the recently deployed stent. Angioplasty was performed under fluoroscopy. Left internal carotid artery angiograms with frontal and lateral views of the neck showed adequate stent positioning and expansion with brisk anterograde flow. Left internal carotid artery angiograms with frontal and lateral views of the head showed improvement of anterograde flow in the left MCA vascular tree with brisk anterograde flow. Delayed left common carotid artery angiograms with frontal and lateral views of the neck showed no evidence of clot formation within the stent. The catheter was subsequently Riddle. Right common femoral artery angiogram was obtained in right anterior oblique view. The puncture is at the level of the common femoral artery. The artery has normal caliber, adequate for closure device. The sheath was exchanged over the wire for an 8 [place] Angio-Seal which was utilized for access closure. Immediate hemostasis was achieved. IMPRESSION: 1. Successful mechanical thrombectomy for treatment of a proximal left M2/MCA anterior division branch occlusion achieving complete recanalization (TICI 3). 2. Atherosclerotic disease of the left carotid bifurcation with stenosis and clot formation suggesting  acute plaque rupture treated with stenting and angioplasty with resolution of stenosis. PLAN: Continue cangrelor infusion until patient is transitioned to oral dual antiplatelet therapy. Electronically Signed   By: Baldemar Lenis M.D.   On: 06/05/2023 13:16   CT ANGIO HEAD NECK W WO CM W PERF (CODE STROKE) Addendum Date: 06/05/2023 ADDENDUM REPORT: 06/05/2023 12:25 ADDENDUM: Please note, there is a dictation error within CTA neck impression #1, which should read: The common carotid and internal carotid arteries are patent within the neck. Atherosclerotic plaque bilaterally. Most notably, there is progressive atherosclerotic plaque about the left carotid bifurcation and within the proximal left ICA with resultant severe near occlusive stenosis of the proximal left ICA. Also of note, atherosclerotic plaque about the right carotid bifurcation results in a 40% stenosis at the right ICA origin. Electronically Signed   By: Jackey Loge D.O.   On: 06/05/2023 12:25   Result Date: 06/05/2023 CLINICAL DATA:  Provided history: Cerebrovascular accident, unspecified mechanism. Right-sided weakness. Right-sided facial droop. Altered mental status. EXAM: CT ANGIOGRAPHY HEAD AND NECK CT PERFUSION BRAIN TECHNIQUE: Multidetector CT imaging of the head and neck was performed using the standard protocol during bolus administration of intravenous contrast. Multiplanar CT image reconstructions and MIPs were obtained to evaluate the vascular anatomy. Carotid stenosis measurements (when applicable) are obtained utilizing NASCET criteria, using the distal internal carotid diameter as the denominator. Multiphase CT imaging of the brain was performed following IV bolus contrast injection. Subsequent parametric perfusion maps were calculated using RAPID software. RADIATION DOSE REDUCTION: This exam was performed according to the departmental dose-optimization program which includes automated exposure control, adjustment of  the mA and/or kV according to patient size and/or use of iterative reconstruction technique. CONTRAST:  OMNIPAQUE IOHEXOL 350 MG/ML SOLN COMPARISON:  Noncontrast head CT performed earlier today 06/05/2023. MRA head and MRA neck 10/04/2019. FINDINGS: CTA NECK FINDINGS Aortic arch: Common origin of the innominate and left common carotid arteries. Atherosclerotic plaque within the visualized thoracic aorta and proximal major branch vessels of the neck. Streak/beam hardening artifact arising from a dense right-sided contrast bolus partially obscures the right subclavian artery. Within this limitation, there is no appreciable hemodynamically significant innominate or proximal subclavian artery stenosis. Right carotid system: CCA and ICA patent within the neck. Atherosclerotic plaque, greatest about the carotid bifurcation. Resultant 40% stenosis at the ICA origin. Left carotid system: CCA and ICA patent within the  neck. Atherosclerotic plaque. Most notably, there is prominent atherosclerotic plaque about the carotid bifurcation and within the proximal ICA which has progressed from the prior MRA neck of 10/04/2019. Resultant severe (near occlusive) stenosis of the proximal ICA. Tortuosity of the cervical ICA Vertebral arteries: The vertebral arteries are patent within the neck. Streak/beam hardening artifact limits evaluation of the right vertebral artery origin. At least moderate stenosis is suspected at this site. Atherosclerotic plaque scattered elsewhere within the cervical right vertebral artery with no more than mild stenosis. Calcified atherosclerotic plaque at the left vertebral artery origin with suspected at least moderate stenosis. Nonstenotic calcified plaque elsewhere within the cervical left vertebral artery. Skeleton: Cervical spondylosis. Other neck: No neck mass or cervical lymphadenopathy. Upper chest: No consolidation within the imaged lung apices. Review of the MIP images confirms the above  findings CTA HEAD FINDINGS Anterior circulation: The intracranial internal carotid arteries are patent. As sclerotic plaque within both vessels. No more than mild stenosis on the right. Up to moderate stenosis within the left cavernous segment. The M1 middle cerebral arteries are patent. Abrupt occlusion of a proximal M2 left middle cerebral artery vessel (series 11, image 22). Atherosclerotic irregularity of the M2 and more distal MCA vessels elsewhere. The anterior cerebral arteries are patent. Atherosclerotic irregularity of both vessels without high-grade proximal stenosis. A possible 2 mm periophthalmic left ICA aneurysm with better appreciated on the prior MRA head of 10/04/2019. Posterior circulation: The intracranial vertebral arteries are patent. Atherosclerotic plaque within the right V4 segment sites of mild stenosis. Non-stenotic atherosclerotic plaque within the left V4 segment. The basilar artery is patent. The posterior cerebral arteries are patent. Posterior communicating arteries are diminutive or absent, bilaterally. Venous sinuses: Assessment for dural venous sinus thrombosis is limited due to contrast timing. Anatomic variants: As described. Review of the MIP images confirms the above findings CT Brain Perfusion Findings: ASPECTS: CBF (<30%) Volume: 41mL Perfusion (Tmax>6.0s) volume: 86mL Mismatch Volume: 45mL Infarction Location:Left MCA vascular territory CTA head impression #1, the CT perfusion head impression and the presence of a severe stenosis of the proximal cervical left ICA called by telephone at the time of interpretation on 06/05/2023 at 8:40 am to provider ERIC [facility] , who verbally acknowledged these results. IMPRESSION: CTA neck: 1. No common carotid and internal carotid arteries are patent within the neck. Atherosclerotic plaque bilaterally. Most notably, there is progressive atherosclerotic plaque about the left carotid bifurcation and within the proximal left ICA with resultant  severe, near occlusive stenosis of the proximal left ICA. Also of note, atherosclerotic plaque about the right carotid bifurcation results in 40% stenosis at the right ICA origin. 2. The vertebral arteries are patent within the neck. Atherosclerotic plaque bilaterally as described. Most notably, there is suspected at least moderate stenoses at the bilateral vertebral artery origins. 3. Aortic Atherosclerosis (ICD10-I70.0). CTA head: 1. Abrupt occlusion of a proximal M2 left middle cerebral artery vessel. 2. Background intracranial atherosclerotic disease as described. 3. A possible 2 mm periophthalmic left ICA aneurysm was better appreciated on the prior MRA head of 10/04/2019. CT perfusion head: The perfusion software identifies a 41 mL core infarct in the left MCA vascular territory. The perfusion software identifies an 86 mL region of critically hypoperfused parenchyma within the left MCA vascular territory (utilizing the Tmax>6 seconds threshold). Reported mismatch volume: 45 mL Electronically Signed: By: Jackey Loge D.O. On: 06/05/2023 09:07   CT HEAD CODE STROKE WO CONTRAST Result Date: 06/05/2023 CLINICAL DATA:  Code stroke. Neuro deficit, acute,  stroke suspected. EXAM: CT HEAD WITHOUT CONTRAST TECHNIQUE: Contiguous axial images were obtained from the base of the skull through the vertex without intravenous contrast. RADIATION DOSE REDUCTION: This exam was performed according to the departmental dose-optimization program which includes automated exposure control, adjustment of the mA and/or kV according to patient size and/or use of iterative reconstruction technique. COMPARISON:  Brain MRI 10/04/2019.  Noncontrast head CT 10/03/2019. FINDINGS: Brain: Generalized cerebral atrophy. Loss of gray-white differentiation consistent with an acute infarct within the left insula and within portions of the left frontal operculum (MCA vascular territory). Known small chronic cortically-based infarcts within the left  frontal, left parietal and left occipital lobes were better appreciated on the prior brain MRI of 10/04/2019 (acute at that time). Mild patchy and ill-defined hypoattenuation within the cerebral white matter, nonspecific but compatible with chronic small vessel ischemic disease. Subcentimeter infarct within the superior right cerebellar hemisphere, new from the prior MRI but chronic in appearance. Loss of gray-white differentiation there is no acute intracranial hemorrhage. No extra-axial fluid collection. No evidence of an intracranial mass. No midline shift. Vascular: No hyperdense vessel.  Atherosclerotic calcifications. Skull: No calvarial fracture or aggressive osseous lesion. Sinuses/Orbits: No mass or acute finding within the imaged orbits. No significant paranasal sinus disease. ASPECTS [facility] Stroke Program Early CT Score) - Ganglionic level infarction (caudate, lentiform nuclei, internal capsule, insula, M1-M3 cortex): 5 - Supraganglionic infarction (M4-M6 cortex): 2 Total score (0-10 with 10 being normal): 7 Impression #1 called by telephone at the time of interpretation on 06/05/2023 at 8:40 am to provider Dr. Otelia Limes, who verbally acknowledged these results. IMPRESSION: 1. Acute left MCA territory infarct affecting the left insula and portions of the left frontal operculum. ASPECTS is 7. 2. Known small chronic cortically-based infarcts within the left frontal, left parietal and left occipital lobes were better appreciated on the prior brain MRI of 10/04/2019 (acute at that time). 3. Background mild cerebral white matter chronic small vessel ischemic disease. 4. Subcentimeter infarct within the right cerebellar hemisphere, new from prior MRI but chronic in appearance. 5. Generalized cerebral atrophy. Electronically Signed   By: Jackey Loge D.O.   On: 06/05/2023 08:45     PHYSICAL EXAM  Temp:  [97.6 F (36.4 C)-100 F (37.8 C)] 98.4 F (36.9 C) (02/28 0800) Pulse Rate:  [77-114] 83 (02/28  0845) Resp:  [9-49] 22 (02/28 0845) BP: (64-172)/(28-149) 135/64 (02/28 0845) SpO2:  [80 %-100 %] 96 % (02/28 0845) FiO2 (%):  [40 %-100 %] 40 % (02/28 0736)  General -critically ill intubated elderly patient  Cardiovascular -regular rhythm on monitor  Neuro: Still intubated and vent dependent.  On Precedex and fentanyl.  He is unresponsive and consistent with a sedated exam.  Patient does not open eyes with movement or repositioning as CT head yesterday, does not resist against forced eye opening.  No blink to threat bilaterally weak cough and gag.  No withdrawal to pain on any extremity.  ASSESSMENT/PLAN Mr. Wasil Wolke. is a 83 y.o. male with history of hypertension, diabetes, CKD 3, stroke admitted for right-sided weakness numbness, aphasia, right facial droop, left gaze preference. No TNK given due to outside window.  Patient underwent mechanical thrombectomy with left ICA stenting.  On 2/15, patient was intubated after episodes of coffee-ground emesis, tachypnea, tachycardia.  Transferred to ICU and CCM took over as attending.  Stroke:  left MCA infarct with left M2 occlusion and left ICA near occlusion s/p IR with TICI3 and left ICA stenting, likely  secondary to large vessel disease source versus cardiomyopathy with low EF CT left MCA infarct CT head and neck left M2 occlusion, left ICA near occlusion, right ICA 43 stenosis, bilateral VA origin severe stenosis CTP 41/86 Status post IR with TICI3 and left ICA stenting MRI left MCA infarct at left insular and left frontal operculum, prominent and confluent petechial hemorrhagic transformation CT repeat 2/7 stable confluent petechial hemorrhage 2D Echo EF 30% LDL 107 HgbA1c 6.1 P2 Y12 = 73 UDS negative SCDs for VTE prophylaxis aspirin 81 mg daily and clopidogrel 75 mg daily prior to admission, now on ASA.  Plavix was DC'd 2/17 due to severe anemia, continue to hold Ongoing aggressive stroke risk factor management Therapy  recommendations:  SNF Disposition: Pending   History of stroke 10/08/2019 admitted for left MCA infarct due to right upper extremity weakness.  MRA head and neck showed left ICA 30% stenosis.  EF 45 to 50%.  LDL 105, A1c 5.6.  Recommended loop recorder at that time but only got 30-day CardioNet monitoring which was no A-fib.  Discharged on DAPT and Lipitor 80.  Respiratory failure Leukocytosis On 2/15, patient had episode of emesis with probable subsequent aspiration and went into respiratory distress with increased work of breathing unable to maintain SpO2 After discussion with family, patient intubated for airway protection and respiratory failure continue precedex and fentanyl Ventilator management per CCM, appreciate assistance Fever Tmax 102.5--101.4--afebrile  WBC 10.1--23.5--44.5--41.4--29.9--19.5--7.4--4.9--5.7-14.3>13.3--15.2->19.8 Continue cefepime Rocephin course completed Off vancomycin  GI bleeding Acute blood loss anemia Patient had 2 episodes of coffee-ground emesis, likely stress ulcer Plavix discontinued 2/27 due to severe anemia, ASA continued Resume ASA 2/20  Hemoglobin 11.2-> 8.4->PRBC->8.8->8.1->7.6>8.7>8.0>5.7->2u PRBC->8.1->8.8 Continue Close monitoring in ICU  Cardiomyopathy CHF 09/2019 EF 45 to 50% Current admission: EF 30% Cardiology on board, appreciate assistance Continue ASA Agree with GDMT (losartan, entresto, metoprolol and spironolactone) once BP and Cr tolerates  Diabetes HgbA1c 6.1 goal < 7.0 Hyperglycemia improved  Off insulin drip CBG monitoring SSI  DM education and close PCP follow up  History of hypertension, now hypotensive Unstable, requiring pressor support Continue Levophed and vasopressin Long term BP goal normotensive  Hyperlipidemia Home meds: Lipitor 80 LDL 105, goal < 70 Now on lipitor 80 and Zetia  Continue statin and zetia if able to take NPO at discharge  Dysphagia Poststroke dysphagia Speech on board N.p.o.  OG  tube Osmolite 1.5 @ 51ml/hr ProSource supplement BID  AKI on CKD  Cre 1.78->3.41-> 4.61->4.07-> 3.85->3.66->4.19->5.66 --> 6.92>2.35>2.5>2.32->2.00 Baseline creatinine around 1.5 Aggressively treat hypotension Nephrology on board Currently on CCRT  Other Stroke Risk Factors Advanced age Obesity, Body mass index is 43.05 kg/m.   Other Active Problems Presyncopal episode with mild hypotension 2/14-suspect was due to straining to have a bowel movement, will prescribe as needed suppository  Hospital day # 23   Pt seen by Neuro NP/APP and later by MD. Note/plan to be edited by MD as needed.    Lynnae January, DNP, AGACNP-BC [facility] Please use AMION for contact information & EPIC for messaging.  ATTENDING NOTE: I reviewed above note and agree with the assessment and plan. Pt was seen and examined.   Wife and granddaughter at the bedside. Pt still intubated on sedation and pressors, on CRRT, however, not responsive today. Not open eyes and did not resist eye opening as yesterday, eyes midline, pupils equal sluggish to light, weak corneal and gag, not moving extremities on pain stimulation.   Was on 3 pressors and now on 2,  still on vent and increased O2 need, worsening leukocytosis but improved Cre and Hb. On CRRT. Pt mental status worsened and again had long discussion with family today for poor prognosis. Palliative care on board.  For detailed assessment and plan, please refer to above/below as I have made changes wherever appropriate.   Neurology has nothing to add at this time, recommend continued palliative care involvement. Please call with any questions. Thanks for the consult.  Marvel Plan, MD PhD Stroke Neurology 06/28/2023 2:00 PM

## 2023-06-28 NOTE — Progress Notes (Signed)
 [facility] Progress Note    Assessment/ Plan:   AKI on CKD 3A: Baseline creatinine 1.8.  Likely ATN in the setting of low EF and shock requiring pressors.   -CRRT start 2/22-2/23 however poor flows/catheter issues. R/s 2/23 with new catheter (appreciate CCM's assistance) -overall poor long term HD candidate. Originial plan was to do 3 day trial of CRRT which was completed on 2/26.  However, family would like to move ahead with full scope treatment.  Palliative following, now DNR.  Will continue with CRRT in the interim per family wishes -Continue with UF goals of net even, can increase UF goals as pressor requirements allow -Dialysate 2K, rest of bags for 4K  -Continue to monitor daily Cr, Dose meds for GFR -Monitor Daily I/Os, Daily weight  -Maintain MAP>65 for optimal renal perfusion.  -Avoid nephrotoxic medications including NSAIDs -Use synthetic opioids (Fentanyl/Dilaudid) if needed   Acute hypoxic respiratory failure: Aspiration.  Ventilatory management per primary team   Klebsiella pneumonia: Antibiotics per primary team   CVA: Status post thrombectomy.  Mental status continues to be poor.  Continue to monitor   Heart failure with reduced ejection fraction: Manage volume status as above, UF as tolerated with CRRT   Uncontrolled type 2 diabetes with hyperglycemia: Management per primary team   Anemia: Multifactorial.  Transfusions per primary team  Shock: pressor support per CCM  Discussed with ICU RN and palliative. Discussed with wife and granddaughter at the bedside.  Subjective:   Patient seen and examined on CRRT. Palliative discussing with granddaughter and wife at the bedside. Wife would still like to continue with treatment it seems. Net pos 66cc Pressors: levo, vaso   Objective:   BP (!) 143/63   Pulse 83   Temp 98.4 F (36.9 C) (Axillary)   Resp (!) 22   Ht 5\' 10"  (1.778 m)   Wt (!) 136.1 kg   SpO2 97%   BMI 43.05 kg/m   Intake/Output  Summary (Last 24 hours) at 06/28/2023 1041 Last data filed at 06/28/2023 1000 Gross per 24 hour  Intake 5594.44 ml  Output 5498 ml  Net 96.44 ml   Weight change:   Physical Exam: Gen: ill appearing, on vent/sedated CVS: RRR Resp: CTA B/L, intubated Abd: soft, nt/nd Ext: trace dependent edema LE Neuro: not following commands, unresp Dialysis access: right internal jugular temp HD catheter  Imaging: DG CHEST PORT 1 VIEW Result Date: 06/27/2023 CLINICAL DATA:  Tachypnea. EXAM: PORTABLE CHEST 1 VIEW COMPARISON:  June 24, 2023. FINDINGS: Stable cardiomediastinal silhouette is noted with interval development of central pulmonary vascular congestion and probable bilateral pulmonary edema. Bibasilar atelectasis is noted with probable pleural effusions, right greater than left. Endotracheal and feeding tubes are in grossly good position. Bilateral internal jugular catheters are unchanged. IMPRESSION: Interval development of central pulmonary vascular congestion and probable bilateral pulmonary edema with associated bibasilar atelectasis and pleural effusions, right greater than left. Electronically Signed   By: Lupita Raider M.D.   On: 06/27/2023 11:36     Labs: BMET Recent Labs  Lab 06/25/23 0541 06/25/23 1621 06/26/23 0720 06/26/23 1539 06/27/23 0453 06/27/23 1537 06/28/23 0030  NA 135 138 137 134* 136 138 137  K 5.1 5.1 4.5 5.6* 5.1 4.9 4.9  CL 107 102 103 102 102 105 102  CO2 20* 25 21* 24 24 24 25   GLUCOSE 199* 231* 171* 294* 148* 132* 100*  BUN 42* 36* 29* 39* 36* 31* 28*  CREATININE 2.35* 2.26* 1.97* 2.57*  2.32* 2.03* 2.00*  CALCIUM 7.9* 8.9 8.7* 8.3* 9.0 8.5* 9.4  PHOS 3.4 3.1 2.8 2.8 2.7 3.0 3.0   CBC Recent Labs  Lab 06/25/23 0541 06/27/23 0453 06/27/23 1317 06/28/23 0028  WBC 14.3* 13.3* 15.2* 19.8*  NEUTROABS 11.4*  --   --   --   HGB 8.0* 5.7* 8.1* 8.8*  HCT 26.3* 19.0* 25.6* 28.1*  MCV 100.8* 103.3* 97.3 95.9  PLT 132* 157 166 209    Medications:      sodium chloride   Intravenous Once   sodium chloride   Intravenous Once   aspirin  81 mg Per Tube Daily   atorvastatin  80 mg Per Tube Daily   Chlorhexidine Gluconate Cloth  6 each Topical Daily   ezetimibe  10 mg Per Tube Daily   feeding supplement (PROSource TF20)  60 mL Per Tube BID   insulin aspart  0-20 Units Subcutaneous Q4H   insulin aspart  6 Units Subcutaneous Q4H   insulin glargine  25 Units Subcutaneous BID   leptospermum manuka honey  1 Application Topical Daily   liver oil-zinc oxide   Topical BID   multivitamin  1 tablet Per Tube QHS   mouth rinse  15 mL Mouth Rinse Q2H   pantoprazole (PROTONIX) IV  40 mg Intravenous Q12H   QUEtiapine  100 mg Per Tube BID   sodium chloride flush  3 mL Intravenous Once      Anthony Sar, MD [facility] 06/28/2023, 10:41 AM

## 2023-06-28 NOTE — Progress Notes (Signed)
 NAME:  Nathan Jaggi., MRN:  [id number], DOB:  1941-03-21, LOS: 23 ADMISSION DATE:  06/05/2023 CONSULTATION DATE:  06/05/2023 REFERRING MD:  Otelia Limes - Neuro, CHIEF COMPLAINT: Code Stroke, post-NIR   History of Present Illness:  83 year old man who presented to [facility] ED 2/5 via EMS for unresponsiveness and R-sided deficits. PMHx significant for HTN, HLD, prior CVA without residual deficits, T2DM, CKD stage IIIb, prostate CA, gout.  Patient presented to [facility] ED with significant R-sided deficits, aphasia and poor responsiveness. LKW 2200. Code Stroke initiated. On ED arrival, patient was afebrile with HR 86, BP 154/67, RR 20, SpO2 100%. Noted aphasia, R-sided hemiplegia, R-sided facial droop. Labs were notable for WBC 6.8. Hgb 11.9, Plt 181. INR 1.0. Na 142, K 3.8, CO2 20, Cr 1.59 (baseline), LFTs WNL. Ethanol < 10. COVID negative. CT Head with acute L MCA territory infarct affecting L insula/L frontal operculum, small chronic cortically-based infarcts of L frontal/L parietal/L occipital lobes. CTA Head/Neck with abrupt occlusion of proximal M2 L MCA vessel, ?2mm periophthalmic L ICA aneurysm, bilateral ICA plaque with progressive atherosclerotic plaque L carotid bifurcation with proximal L ICA severe, near-occlusive stenosis.   Taken to [facility] emergently for mechanical thrombectomy ([name]). TICI 3 revascularization achieved. L ICA stenosis and plaque rupture noted at the bulb, treated with stenting/angioplasty. Extubated post-procedure and maintained on cangrelor gtt with plan for transition to DAPT.  PCCM consulted for post-procedure medical management.  Pertinent Medical History:   Past Medical History:  Diagnosis Date   Anemia of chronic disease    Ankle fracture 02/15/2016   Cancer (HCC)    Prostate   Chronic constipation    Chronic kidney disease    stage III   Diabetes mellitus without complication (HCC)    type II    Diabetic peripheral neuropathy (HCC)    Failure to  thrive (0-17)    Fracture of left lower leg    Gout    Hyperlipidemia    Hypertension    Morbid obesity (HCC)    Unstable gait    Significant Hospital Events: Including procedures, antibiotic start and stop dates in addition to other pertinent events   2/5 - Presented to [facility] ED with poor responsiveness, aphasia, R-sided deficits. CT Head with L MCA territory infarct. CTA Head/Neck with occlusion of L MCA M2 segment and severe near-occlusive stenosis of L ICA. Taken to [facility] for MT, stent and angioplasty ([name]). PCCM consulted for post-procedure management. 2/6 transfer out of ICU 2/15 aspiration event early a.m. resulting in respiratory distress, transferred back to ICU and emergently intubated with development of severe shock requiring epi, levo, vaso. Transfused PRBC x1 for possible GI bleed 2/16: 3 pressors, weaning. Coox without cardiogenic component. Antibiotics vanc/cefepime. CT CAP with SBO  2/18: weaned off to only levo now which is likely sedation related  2/20: UOP drop overnight with ongoing poor renal function. Weaning sedation to evaluate neuro status.  2/22: started on trial of CRRT 2/23: HD catheter exchanged for longer one - flow improved. More awake by afternoon 2/24 tried to wean Precedex and pt got quite uncomfortable/agitated. Required Fentanyl infusion addition. Got hypotensive later in day, increase in NE  2/25 NE back down and almost off. Started pulling 50cc/hr from CRRT 2/26 remains on NE 11, CRRT ongoing 27: levo going up to 24, on vaso. CRRT net even. Made DNR today yesterday.  Received 2 units of PRBCs  Interim History / Subjective:   Remains on CRRT, increasing pressor  requirements overnight.  Now maxed out on norepinephrine, phenylephrine, vasopressin 1 episode of nonsustained VT  Objective:  Blood pressure (!) 135/58, pulse 84, temperature 98.4 F (36.9 C), temperature source Axillary, resp. rate (!) 22, height 5\' 10"  (1.778 m), weight (!)  136.1 kg, SpO2 96%.    Vent Mode: PRVC FiO2 (%):  [40 %-100 %] 70 % Set Rate:  [22 bmp] 22 bmp Vt Set:  [580 mL] 580 mL PEEP:  [8 cmH20] 8 cmH20 Plateau Pressure:  [20 cmH20-22 cmH20] 20 cmH20   Intake/Output Summary (Last 24 hours) at 06/28/2023 1022 Last data filed at 06/28/2023 1000 Gross per 24 hour  Intake 5594.44 ml  Output 5498 ml  Net 96.44 ml   Filed Weights   06/25/23 0500 06/26/23 0500 06/27/23 0500  Weight: 134.8 kg 132.4 kg (!) 136.1 kg   Physical Exam: Gen:      No acute distress HEENT:  EOMI, sclera anicteric, ET tube Neck:     No masses; no thyromegaly Lungs:    Clear to auscultation bilaterally; normal respiratory effort CV:         Regular rate and rhythm; no murmurs Abd:      + bowel sounds; soft, non-tender; no palpable masses, no distension Ext:    Bilateral pitting edema; adequate peripheral perfusion Skin:      Warm and dry; no rash Neuro: Sedated, unresponsive  Ancillary Tests Personally Reviewed:   Significant for BUN/creatinine 28/2.00 AST 74, ALT 36 WBC 19.8, hemoglobin 8.8, platelets 209 No new imaging  Assessment & Plan:Resolved Hospital Problem List   Acute hypoxemic respiratory failure due to aspiration Klebsiella pneumonia  - treated.  Ileus and swallowing dysfunction from stroke probably contributed.  Left MCA M2 occlusion and left ICA near occlusion now status post thrombectomy with complete revascularization Likely secondary to large vessel obstruction Heart failure with reduced ejection fraction ejection fraction of 30% with global hypokinesis, normal right ventricular function Hypertension Hyperlipidemia Type 2 diabetes Acute kidney injury superimposed on chronic kidney disease stage IIIb NSVT AM 2/25 Acute blood loss anemia, unclear etiology; may be stress ulcer related vs critical illness. No bloody BM, or OGT output.  Goals of care - poor prognosis for meaningful recovery and could be headed towards trach and possibly ESRD  requiring HD  Plan: Status overnight, multiorgan failure Refractory shock Continuing on CRRT but we may need to stop soon as his hemodynamic status is tenuous Monitor hemoglobin Continue broad antibiotic coverage Unable to wean on the ventilator due to unstable state Wean down pressors as tolerated  Goals of care Made DNR Will need to discuss with family again.   Best Practice (right click and "Reselect all SmartList Selections" daily)   Diet/type: tubefeeds and NPO DVT prophylaxis DOAC, SCD's Pressure ulcer(s): N/A GI prophylaxis: PPI Lines: Central line and yes and it is still needed Foley:  Yes, and it is still needed Code Status:  DNR Last date of multidisciplinary goals of care discussion: ongoing palliative discussion. DNR  The patient is critically ill with multiple organ system failure and requires high complexity decision making for assessment and support, frequent evaluation and titration of therapies, advanced monitoring, review of radiographic studies and interpretation of complex data.   Critical Care Time devoted to patient care services, exclusive of separately billable procedures, described in this note is 35 minutes.   Chilton Greathouse MD [facility] See Amion for pager  If no response to pager , please call [phone] until 7pm  After 7:00 pm call Elink  [phone] 06/28/2023, 10:27 AM

## 2023-06-28 NOTE — Progress Notes (Signed)
 Per CCM, MD okay to attempt to remove fluid per CRRT orders as long as levophed is at or below 40 mcg/min.  06/28/2023 Oralia Manis, RN, BSN 10:38 AM

## 2023-06-28 NOTE — Progress Notes (Addendum)
 Nutrition Follow-up  DOCUMENTATION CODES:   Non-severe (moderate) malnutrition in context of chronic illness  INTERVENTION:   Tube feeding via cortrak tube:  As able resume Osmolite 1.5 at 20 ml/h and advance Osmolite 1.5 by 10 ml every 8 hours to goal rate of 50 ml/hr (1200 ml per day) Prosource TF20 60 ml BID  Provides 1960 kcal, 115 gm protein, 917 ml free water daily   Rena-vit daily   NUTRITION DIAGNOSIS:   Moderate Malnutrition related to chronic illness as evidenced by severe muscle depletion, mild fat depletion, severe fat depletion. Ongoing.   GOAL:   Patient will meet greater than or equal to 90% of their needs Progressing with TF initiation   MONITOR:   TF tolerance, Weight trends, Labs, Diet advancement  REASON FOR ASSESSMENT:   Consult Enteral/tube feeding initiation and management  ASSESSMENT:   Pt admitted for acute ischemic stroke with right sides weakness, right facial droop, and aphagia. Pt with PMH of HTN, DM, CKD III, prior CVA w/ no residual deficits.  Pt discussed during ICU rounds and with RN and MD.  Pt with emesis overnight. No on 2 pressors with increased requirements. TF held due to concerns for adequate perfusion. - Discussed with wife and granddaughter  Palliative care consulted for GOC.   2/05 - s/p IR mechanical thrombectomy and carotid stent placement, Cortrak placed (tip gastric) 2/07 - s/p MBS with recommendation for dysphagia 1 diet with honey-thick liquids, Transferred to progressive unit 2/09 - Cortrak and tube feeds discontinued 2/12 - repeat MBS with recommendation for dysphagia 2 diet with thin liquids 2/15 - Transferred to ICU for respiratory distress, Intubated  2/16 -  CT abdomen concerning for either close loop obstruction or internal hernia 2/18 - start trickle TF 2/21 - s/p cortrak placement, tip gastric; TF to goal   Medications reviewed and include: SSI every 4 hours, 6 units novolog every 4 hours, 15 units lantus  BID, rena-vit, protonix,  Fentanyl  Versed Levophed @ 38 mcg Vasopressin 0.04  Labs reviewed:  Ka 4.9 Phos 3.0 Mag 2.4 CBG's: 88-225   UOP: 0 UF: 5363 ml   Current weight: 136.1 kg Admission weight: 132.6 kg   Diet Order:   Diet Order             Diet NPO time specified  Diet effective now                   EDUCATION NEEDS:   Education needs have been addressed  Skin:  Skin Assessment: Skin Integrity Issues: Skin Integrity Issues:: Stage III Stage II: L scrotum Stage III: L scrotum Incisions: R thigh  Last BM:  100 ml via FMS 2/25  Height:   Ht Readings from Last 1 Encounters:  06/25/23 5\' 10"  (1.778 m)    Weight:   Wt Readings from Last 1 Encounters:  06/27/23 (!) 136.1 kg    Ideal Body Weight:  68.2 kg  BMI:  Body mass index is 43.05 kg/m.  Estimated Nutritional Needs:   Kcal:  1950-2150 kcals  Protein:  115-130 g while on CRRT-plan to adjust once discontinued  Fluid:  1.8 L  Gabreal Worton P., RD, LDN, CNSC See AMiON for contact information

## 2023-06-28 NOTE — TOC Progression Note (Signed)
 Transition of Care (TOC) - Progression Note    Patient Details  Name: Dariyon Urquilla. MRN: [id number] Date of Birth: Jul 08, 1940  Transition of Care [facility]) CM/SW Contact  Mearl Latin, LCSW Phone Number: 06/28/2023, 11:52 AM  Clinical Narrative:    TOC continuing to follow; continues on Vent and CRRT.    Expected Discharge Plan: Skilled Nursing Facility Barriers to Discharge: Continued Medical Work up, English as a second language teacher  Expected Discharge Plan and Services In-house Referral: Clinical Social Work   Post Acute Care Choice: Skilled Nursing Facility Living arrangements for the past 2 months: Single Family Home                                       Social Determinants of Health (SDOH) Interventions SDOH Screenings   Food Insecurity: Patient Unable To Answer (06/07/2023)  Housing: Patient Unable To Answer (06/07/2023)  Transportation Needs: Patient Unable To Answer (06/07/2023)  Utilities: Patient Unable To Answer (06/07/2023)  Depression (PHQ2-9): Low Risk  (12/03/2019)  Social Connections: Patient Unable To Answer (06/07/2023)  Tobacco Use: Medium Risk (06/13/2023)    Readmission Risk Interventions    06/26/2023    5:05 PM  Readmission Risk Prevention Plan  Transportation Screening Complete  Medication Review (RN Care Manager) Complete  PCP or Specialist appointment within 3-5 days of discharge Complete  HRI or Home Care Consult Complete  SW Recovery Care/Counseling Consult Complete  Palliative Care Screening Complete  Skilled Nursing Facility Complete

## 2023-06-28 NOTE — Progress Notes (Addendum)
 Palliative:  HPI: 83 y.o. male with past medical history of HTN, HLD, diabetes, CKD stage 3b, prior CVA with no residual deficits, prostate cancer, failure to thrive admitted on 06/05/2023 with unresponsiveness with right-sided deficits found to have left MCA infarct with left M2 occlusion and left ICA near occlusion s/p IR with mechanical thrombectomy and stenting 2/5. 2/15 aspiration event resulting in severe shock requiring intubation. Hospitalization also complicated by acute GIB and CHF EF 30%.    I met today again with Mrs. Vallin and granddaughter at bedside. I spoke with Mrs. Cortopassi about her husbands ongoing health decline. He had a little less support overnight but now this is increasing. Unfortunately he is not progressing or improving from any of our treatments. Dr. Thedore Mins came and also discussed with Mrs. Dralle. He explained plan to continue CRRT but also clear that this is doing the work for his kidneys to clean toxins from his blood and manage electrolytes but this is not fixing anything or doing anything to make him better. We also discussed that he is not tolerating CRRT well as we are not able to pull fluid off like we would like. We reviewed that his body is not even tolerating or allowing the dialysis to do what it needs to do to fully support him. There was discussion yesterday about considering stopping CRRT if filter continues to clot (this has happened 3 times in past 24 hours per RN). I reviewed with family the consideration that it may be time to consider stopping CRRT and trusting their faith to allow God to do what he needs to do for Mr. Bunton - if it is God's will he will improve without CRRT and if not then it is God's will that it is his time. Wife will consider but wife is not prepared for this change yet.   Mrs. Sexson is very torn about what to do. She shares about her conversation with her church elders and that they have reminded her of God's salvation. She also  reflects on her faith and beliefs that she and her husband have been sealed in the temple and belief that they will reconnect as husband and wife after this life. I encouraged her to cling to this belief as her hope that they will ultimately be together again.   I discussed status with daughter Zella Ball and granddaughter Leavy Cella. They continue to express hope for a miracle.   Discussed with granddaughter Norberto Sorenson [phone] who is returning to [place] today but available as needed to support her family and grandmother. Norberto Sorenson has good understanding of situation.   All questions/concerns addressed. Emotional support provided.   Exam: Ill appearing. Eyes open but no tracking. Not following commands today. HR 90s. Increasing vasopressor needs. Increasing ventilator support 70% FiO2. Extremities cool to touch.   Plan: - DNR - no CPR - Wife wishes for ongoing current care - Prognosis is grim - ongoing family support and conversation  80 min  Yong Channel, NP Palliative Medicine Team Pager [phone] (Please see [email or web address] for schedule) Team Phone [phone]

## 2023-06-29 ENCOUNTER — Inpatient Hospital Stay (HOSPITAL_COMMUNITY)

## 2023-06-29 DIAGNOSIS — J9601 Acute respiratory failure with hypoxia: Secondary | ICD-10-CM | POA: Diagnosis not present

## 2023-06-29 DIAGNOSIS — Z711 Person with feared health complaint in whom no diagnosis is made: Secondary | ICD-10-CM | POA: Diagnosis not present

## 2023-06-29 DIAGNOSIS — Z515 Encounter for palliative care: Secondary | ICD-10-CM | POA: Diagnosis not present

## 2023-06-29 DIAGNOSIS — Z7189 Other specified counseling: Secondary | ICD-10-CM | POA: Diagnosis not present

## 2023-06-29 DIAGNOSIS — R579 Shock, unspecified: Secondary | ICD-10-CM | POA: Diagnosis not present

## 2023-06-29 DIAGNOSIS — I639 Cerebral infarction, unspecified: Secondary | ICD-10-CM | POA: Diagnosis not present

## 2023-06-29 LAB — POCT I-STAT 7, (LYTES, BLD GAS, ICA,H+H)
Acid-Base Excess: 0 mmol/L (ref 0.0–2.0)
Bicarbonate: 25.6 mmol/L (ref 20.0–28.0)
Calcium, Ion: 1.35 mmol/L (ref 1.15–1.40)
HCT: 25 % — ABNORMAL LOW (ref 39.0–52.0)
Hemoglobin: 8.5 g/dL — ABNORMAL LOW (ref 13.0–17.0)
O2 Saturation: 87 %
Patient temperature: 97.6
Potassium: 4.2 mmol/L (ref 3.5–5.1)
Sodium: 135 mmol/L (ref 135–145)
TCO2: 27 mmol/L (ref 22–32)
pCO2 arterial: 44.9 mmHg (ref 32–48)
pH, Arterial: 7.362 (ref 7.35–7.45)
pO2, Arterial: 54 mmHg — ABNORMAL LOW (ref 83–108)

## 2023-06-29 LAB — CBC
HCT: 25.3 % — ABNORMAL LOW (ref 39.0–52.0)
Hemoglobin: 7.9 g/dL — ABNORMAL LOW (ref 13.0–17.0)
MCH: 30.5 pg (ref 26.0–34.0)
MCHC: 31.2 g/dL (ref 30.0–36.0)
MCV: 97.7 fL (ref 80.0–100.0)
Platelets: 153 10*3/uL (ref 150–400)
RBC: 2.59 MIL/uL — ABNORMAL LOW (ref 4.22–5.81)
RDW: 16.8 % — ABNORMAL HIGH (ref 11.5–15.5)
WBC: 14.8 10*3/uL — ABNORMAL HIGH (ref 4.0–10.5)
nRBC: 0.1 % (ref 0.0–0.2)

## 2023-06-29 LAB — GLUCOSE, CAPILLARY
Glucose-Capillary: 149 mg/dL — ABNORMAL HIGH (ref 70–99)
Glucose-Capillary: 152 mg/dL — ABNORMAL HIGH (ref 70–99)
Glucose-Capillary: 177 mg/dL — ABNORMAL HIGH (ref 70–99)
Glucose-Capillary: 220 mg/dL — ABNORMAL HIGH (ref 70–99)
Glucose-Capillary: 89 mg/dL (ref 70–99)
Glucose-Capillary: 99 mg/dL (ref 70–99)

## 2023-06-29 LAB — RENAL FUNCTION PANEL
Albumin: 2 g/dL — ABNORMAL LOW (ref 3.5–5.0)
Anion gap: 4 — ABNORMAL LOW (ref 5–15)
BUN: 28 mg/dL — ABNORMAL HIGH (ref 8–23)
CO2: 28 mmol/L (ref 22–32)
Calcium: 9 mg/dL (ref 8.9–10.3)
Chloride: 104 mmol/L (ref 98–111)
Creatinine, Ser: 1.74 mg/dL — ABNORMAL HIGH (ref 0.61–1.24)
GFR, Estimated: 38 mL/min — ABNORMAL LOW (ref >60)
Glucose, Bld: 131 mg/dL — ABNORMAL HIGH (ref 70–99)
Phosphorus: 2.5 mg/dL (ref 2.5–4.6)
Potassium: 4.3 mmol/L (ref 3.5–5.1)
Sodium: 136 mmol/L (ref 135–145)

## 2023-06-29 LAB — MAGNESIUM: Magnesium: 2.1 mg/dL (ref 1.7–2.4)

## 2023-06-29 MED ORDER — ACD FORMULA A 0.73-2.45-2.2 GM/100ML VI SOLN
3000.0000 mL | Status: DC
  Filled 2023-06-29 (×2): qty 3000

## 2023-06-29 MED ORDER — DEXTROSE 5 % IV SOLN
20.0000 g | INTRAVENOUS | Status: DC
  Filled 2023-06-29 (×2): qty 200

## 2023-06-29 MED ORDER — NOREPINEPHRINE 4 MG/250ML-% IV SOLN: 0.0000 ug/min | INTRAVENOUS | Status: DC

## 2023-06-29 MED ORDER — NOREPINEPHRINE 16 MG/250ML-% IV SOLN
0.0000 ug/min | INTRAVENOUS | Status: DC
  Administered 2023-06-29 – 2023-06-30 (×3): 40 ug/min via INTRAVENOUS
  Administered 2023-06-30: 29 ug/min via INTRAVENOUS
  Administered 2023-07-01: 23 ug/min via INTRAVENOUS
  Administered 2023-07-02: 12 ug/min via INTRAVENOUS
  Administered 2023-07-03: 16 ug/min via INTRAVENOUS
  Administered 2023-07-04: 36 ug/min via INTRAVENOUS
  Administered 2023-07-04: 32 ug/min via INTRAVENOUS
  Administered 2023-07-05 (×2): 40 ug/min via INTRAVENOUS
  Filled 2023-06-29 (×11): qty 250

## 2023-06-29 MED ORDER — LORAZEPAM 2 MG/ML IJ SOLN
INTRAMUSCULAR | Status: AC
Start: 2023-06-29 — End: 2023-06-30
  Filled 2023-06-29: qty 1

## 2023-06-29 MED ORDER — HEPARIN SODIUM (PORCINE) 5000 UNIT/ML IJ SOLN
5000.0000 [IU] | Freq: Three times a day (TID) | INTRAMUSCULAR | Status: DC
  Administered 2023-06-29 – 2023-07-05 (×19): 5000 [IU] via SUBCUTANEOUS
  Filled 2023-06-29 (×18): qty 1

## 2023-06-29 NOTE — Plan of Care (Signed)
 The patient is stable but in many ways not improving or progressing toward goals. Care is moving in the direction of comfort care depending on family readiness.

## 2023-06-29 NOTE — Progress Notes (Signed)
   06/29/23 0527  Provider Notification  Provider Name/Title Dr Olga Coaster PCCM  Date Provider Notified 06/29/23  Time Provider Notified [phone]  Method of Notification Call (Via [facility] RN Morrie Sheldon)  Notification Reason Change in status (Patient vasopressor requirements increasing, up to 58 of levo. Tachycardic and having multiforms PVCs. CRRT filter continuing to clot with 2 filter changes this shift)  Provider response See new orders  Date of Provider Response 06/29/23  Time of Provider Response [phone]

## 2023-06-29 NOTE — Progress Notes (Signed)
 [facility] Progress Note    Assessment/ Plan:   AKI on CKD 3A: Baseline creatinine 1.8.  Likely ATN in the setting of low EF and shock requiring pressors.   -CRRT start 2/22-2/23 however poor flows/catheter issues. R/s 2/23 with new catheter (appreciate CCM's assistance) -overall poor long term HD candidate. Originial plan was to do 3 day trial of CRRT which was completed on 2/26.  However, family wanted to move ahead with full scope treatment.  Palliative following, now DNR.  CRRT stopped 3/1 given worsening shock despite keeping net even. Will keep off unless anything changes and needs to be restarted. Poor prognosis and I don't really see a feasible end game especially given worsening shock/condition unless moving towards comfort care. Will be on stand by if CRRT is needed again, let me know.  -Continue to monitor daily Cr, Dose meds for GFR -Monitor Daily I/Os, Daily weight  -Maintain MAP>65 for optimal renal perfusion.  -Avoid nephrotoxic medications including NSAIDs -Use synthetic opioids (Fentanyl/Dilaudid) if needed   Acute hypoxic respiratory failure: Aspiration.  Ventilatory management per primary team   Klebsiella pneumonia: Antibiotics per primary team   CVA: Status post thrombectomy.  Mental status continues to be poor.  Continue to monitor   Heart failure with reduced ejection fraction: Manage volume status as above, UF as tolerated with CRRT   Uncontrolled type 2 diabetes with hyperglycemia: Management per primary team   Anemia: Multifactorial.  Transfusions per primary team  Shock: pressor support per CCM  Discussed with ICU RN, CCM, and palliative. Discussed with 2 daughters at the bedside, son on their speaker phone  Subjective:   Patient seen and examined bedside. Max'ed out on levo now, worsening shock. Was even requiring levophed past the recommended dose. Discussed with CCM. Stopped CRRT earlier today, no plans to restart for now until family  makes a final decision Family is at a crossroads in regards to code status. Patient's wife was briefly on the phone and said do not let them resuscitate. 2 daughters at the bedside with son on speaker phone. We discussed extensively in regards to code status. I understand and can relate to how difficult of a decision this is as a family. I did explain to them that putting him through CPR is another layer of injury for him and will create a huge set back. They do want to make the best informed decision in regards to patient's care. I did explain to them that the risks>benefits as of right now in regards to CRRT hence stopped. They are in agreement thus far. I also explained that DNR does not mean give up which it seems that some of the children were under that impression/had that concern. However, I did also explain to them that we are pretty much hitting a wall in regards to trying to get him better.    Objective:   BP 91/60   Pulse 96   Temp 98.8 F (37.1 C) (Axillary)   Resp (!) 22   Ht 5\' 10"  (1.778 m)   Wt 134.3 kg   SpO2 100%   BMI 42.48 kg/m   Intake/Output Summary (Last 24 hours) at 06/29/2023 1148 Last data filed at 06/29/2023 1000 Gross per 24 hour  Intake 2095.98 ml  Output 2957.2 ml  Net -861.22 ml   Weight change:   Physical Exam: Gen: ill appearing, on vent/sedated CVS: RRR Resp: CTA B/L, intubated Abd: soft, nt/nd Ext: trace dependent edema LE Neuro: not following commands, unresp Dialysis  access: right internal jugular temp HD catheter  Imaging: DG CHEST PORT 1 VIEW Result Date: 06/29/2023 CLINICAL DATA:  Hypoxemia. EXAM: PORTABLE CHEST 1 VIEW COMPARISON:  06/27/2023 FINDINGS: ET tube tip is above the carina. There is a right IJ catheter with tip at the distal SVC. Feeding tube courses below the level of the field of view. Stable cardiomediastinal contours. Persistent bilateral pleural effusions with bibasilar atelectasis. Persistent pulmonary vascular congestion.  Interstitial edema is decreased from previous exam. IMPRESSION: 1. Persistent bilateral pleural effusions with bibasilar atelectasis. 2. Decreased interstitial edema. Electronically Signed   By: Signa Kell M.D.   On: 06/29/2023 10:30     Labs: BMET Recent Labs  Lab 06/26/23 0720 06/26/23 1539 06/27/23 0453 06/27/23 1537 06/28/23 0030 06/28/23 1535 06/29/23 0420 06/29/23 0617  NA 137 134* 136 138 137 133* 136 135  K 4.5 5.6* 5.1 4.9 4.9 4.8 4.3 4.2  CL 103 102 102 105 102 102 104  --   CO2 21* 24 24 24 25 23 28   --   GLUCOSE 171* 294* 148* 132* 100* 405* 131*  --   BUN 29* 39* 36* 31* 28* 31* 28*  --   CREATININE 1.97* 2.57* 2.32* 2.03* 2.00* 1.80* 1.74*  --   CALCIUM 8.7* 8.3* 9.0 8.5* 9.4 8.2* 9.0  --   PHOS 2.8 2.8 2.7 3.0 3.0 2.4* 2.5  --    CBC Recent Labs  Lab 06/25/23 0541 06/27/23 0453 06/27/23 1317 06/28/23 0028 06/29/23 0420 06/29/23 0617  WBC 14.3* 13.3* 15.2* 19.8* 14.8*  --   NEUTROABS 11.4*  --   --   --   --   --   HGB 8.0* 5.7* 8.1* 8.8* 7.9* 8.5*  HCT 26.3* 19.0* 25.6* 28.1* 25.3* 25.0*  MCV 100.8* 103.3* 97.3 95.9 97.7  --   PLT 132* 157 166 209 153  --     Medications:     sodium chloride   Intravenous Once   sodium chloride   Intravenous Once   aspirin  81 mg Per Tube Daily   atorvastatin  80 mg Per Tube Daily   Chlorhexidine Gluconate Cloth  6 each Topical Daily   ezetimibe  10 mg Per Tube Daily   feeding supplement (PROSource TF20)  60 mL Per Tube BID   heparin injection (subcutaneous)  5,000 Units Subcutaneous Q8H   insulin aspart  0-20 Units Subcutaneous Q4H   leptospermum manuka honey  1 Application Topical Daily   liver oil-zinc oxide   Topical BID   multivitamin  1 tablet Per Tube QHS   mouth rinse  15 mL Mouth Rinse Q2H   pantoprazole (PROTONIX) IV  40 mg Intravenous Q12H   QUEtiapine  100 mg Per Tube BID   sodium chloride flush  3 mL Intravenous Once      Anthony Sar, MD [facility] 06/29/2023, 11:48 AM

## 2023-06-29 NOTE — Progress Notes (Signed)
   06/29/23 0330  Provider Notification  Provider Name/Title Dr Vito Backers  Date Provider Notified 06/29/23  Time Provider Notified 0330  Method of Notification Page  Notification Reason Change in status (CRRT filter clotting, and this filter is new. Changed 06/28/2023 2100.)  Provider response No new orders (Restart CRRT with current orders and dayshift will round and make decisions on adding heparin)  Date of Provider Response 06/29/23  Time of Provider Response 0330

## 2023-06-29 NOTE — Progress Notes (Signed)
 eLink Physician-Brief Progress Note Patient Name: Nathan Proctor. DOB: Jun 24, 1940 MRN: [id number]   Date of Service  06/29/2023  HPI/Events of Note    eICU Interventions     Shock on pressors Multiorgan failure Brief Hypotension Sinus tach Difficulty with clotted dialysis line On vent  Will obtain ABG and CXR     [facility] 06/29/2023, 5:37 AM

## 2023-06-29 NOTE — Progress Notes (Addendum)
 NAME:  Nathan Proctor., MRN:  [id number], DOB:  1940/10/15, LOS: 24 ADMISSION DATE:  06/05/2023 CONSULTATION DATE:  06/05/2023 REFERRING MD:  Otelia Limes - Neuro, CHIEF COMPLAINT: Code Stroke, post-NIR   History of Present Illness:  83 year old man who presented to [facility] ED 2/5 via EMS for unresponsiveness and R-sided deficits. PMHx significant for HTN, HLD, prior CVA without residual deficits, T2DM, CKD stage IIIb, prostate CA, gout.  Patient presented to [facility] ED with significant R-sided deficits, aphasia and poor responsiveness. LKW 2200. Code Stroke initiated. On ED arrival, patient was afebrile with HR 86, BP 154/67, RR 20, SpO2 100%. Noted aphasia, R-sided hemiplegia, R-sided facial droop. Labs were notable for WBC 6.8. Hgb 11.9, Plt 181. INR 1.0. Na 142, K 3.8, CO2 20, Cr 1.59 (baseline), LFTs WNL. Ethanol < 10. COVID negative. CT Head with acute L MCA territory infarct affecting L insula/L frontal operculum, small chronic cortically-based infarcts of L frontal/L parietal/L occipital lobes. CTA Head/Neck with abrupt occlusion of proximal M2 L MCA vessel, ?2mm periophthalmic L ICA aneurysm, bilateral ICA plaque with progressive atherosclerotic plaque L carotid bifurcation with proximal L ICA severe, near-occlusive stenosis.   Taken to [facility] emergently for mechanical thrombectomy ([name]). TICI 3 revascularization achieved. L ICA stenosis and plaque rupture noted at the bulb, treated with stenting/angioplasty. Extubated post-procedure and maintained on cangrelor gtt with plan for transition to DAPT.  PCCM consulted for post-procedure medical management.  Pertinent Medical History:   Past Medical History:  Diagnosis Date   Anemia of chronic disease    Ankle fracture 02/15/2016   Cancer (HCC)    Prostate   Chronic constipation    Chronic kidney disease    stage III   Diabetes mellitus without complication (HCC)    type II    Diabetic peripheral neuropathy (HCC)    Failure to  thrive (0-17)    Fracture of left lower leg    Gout    Hyperlipidemia    Hypertension    Morbid obesity (HCC)    Unstable gait    Significant Hospital Events: Including procedures, antibiotic start and stop dates in addition to other pertinent events   2/5 - Presented to [facility] ED with poor responsiveness, aphasia, R-sided deficits. CT Head with L MCA territory infarct. CTA Head/Neck with occlusion of L MCA M2 segment and severe near-occlusive stenosis of L ICA. Taken to [facility] for MT, stent and angioplasty ([name]). PCCM consulted for post-procedure management. 2/6 transfer out of ICU 2/15 aspiration event early a.m. resulting in respiratory distress, transferred back to ICU and emergently intubated with development of severe shock requiring epi, levo, vaso. Transfused PRBC x1 for possible GI bleed 2/16: 3 pressors, weaning. Coox without cardiogenic component. Antibiotics vanc/cefepime. CT CAP with SBO  2/18: weaned off to only levo now which is likely sedation related  2/20: UOP drop overnight with ongoing poor renal function. Weaning sedation to evaluate neuro status.  2/22: started on trial of CRRT 2/23: HD catheter exchanged for longer one - flow improved. More awake by afternoon 2/24 tried to wean Precedex and pt got quite uncomfortable/agitated. Required Fentanyl infusion addition. Got hypotensive later in day, increase in NE  2/25 NE back down and almost off. Started pulling 50cc/hr from CRRT 2/26 remains on NE 11, CRRT ongoing 27: levo going up to 24, on vaso. CRRT net even. Made DNR today yesterday.  Received 2 units of PRBCs  Interim History / Subjective:   Remains on CRRT, Levophed requirements  are increasing Remains unresponsive  Objective:  Blood pressure (!) 93/40, pulse 96, temperature 99.2 F (37.3 C), temperature source Axillary, resp. rate (!) 22, height 5\' 10"  (1.778 m), weight 134.3 kg, SpO2 100%.    Vent Mode: PRVC FiO2 (%):  [60 %-90 %] 90 % Set Rate:   [22 bmp] 22 bmp Vt Set:  [580 mL] 580 mL PEEP:  [8 cmH20] 8 cmH20 Plateau Pressure:  [12 cmH20-20 cmH20] 20 cmH20   Intake/Output Summary (Last 24 hours) at 06/29/2023 1215 Last data filed at 06/29/2023 1000 Gross per 24 hour  Intake 2015.15 ml  Output 2957.2 ml  Net -942.05 ml   Filed Weights   06/26/23 0500 06/27/23 0500 06/29/23 0500  Weight: 132.4 kg (!) 136.1 kg 134.3 kg   Physical Exam: Blood pressure (!) 93/40, pulse 96, temperature 99.2 F (37.3 C), temperature source Axillary, resp. rate (!) 22, height 5\' 10"  (1.778 m), weight 134.3 kg, SpO2 100%. Gen:      No acute distress, ET tube, chronically ill-appearing HEENT:  EOMI, sclera anicteric Neck:     No masses; no thyromegaly Lungs:    Clear to auscultation bilaterally; normal respiratory effort CV:         Regular rate and rhythm; no murmurs Abd:      + bowel sounds; soft, non-tender; no palpable masses, no distension Ext:    No edema; adequate peripheral perfusion Skin:      Warm and dry; no rash Neuro: Unresponsive  Lab/imaging reviewed Significant for BUN/creatinine 28/1.74 WBC 14.8, hemoglobin 7.9, platelets 153 Chest x-ray with persistent bilateral effusions, atelectasis  Assessment & Plan:Resolved Hospital Problem List   Acute hypoxemic respiratory failure due to aspiration Klebsiella pneumonia  - treated.  Ileus and swallowing dysfunction from stroke probably contributed.  Left MCA M2 occlusion and left ICA near occlusion now status post thrombectomy with complete revascularization Likely secondary to large vessel obstruction Heart failure with reduced ejection fraction ejection fraction of 30% with global hypokinesis, normal right ventricular function Hypertension Hyperlipidemia Type 2 diabetes Acute kidney injury superimposed on chronic kidney disease stage IIIb NSVT AM 2/25 Acute blood loss anemia, unclear etiology; may be stress ulcer related vs critical illness. No bloody BM, or OGT output.  Goals of  care - poor prognosis for meaningful recovery and could be headed towards trach and possibly ESRD requiring HD  Plan: Worsening multiorgan failure Refractory shock with increasing pressor requirements.  Not able to tolerate CRRT even after keeping it even We will discontinue CRRT today Continue broad antibiotic coverage Unable to wean on the ventilator due to unstable state Continue pressors  Goals of care Discussed with family, nephrology, palliative care We are stopping CRRT as he is unable to tolerate it due to hemodynamic status Family understands but do not want to transition him to comfort measures yet They confirm no CPR and defib but they want him to remain on the ventilator for now.  Best Practice (right click and "Reselect all SmartList Selections" daily)   Diet/type: tubefeeds and NPO DVT prophylaxis DOAC, SCD's Pressure ulcer(s): N/A GI prophylaxis: PPI Lines: Central line and yes and it is still needed Foley:  Yes, and it is still needed Code Status:  DNR Last date of multidisciplinary goals of care discussion: ongoing palliative discussion. DNR  The patient is critically ill with multiple organ system failure and requires high complexity decision making for assessment and support, frequent evaluation and titration of therapies, advanced monitoring, review of radiographic studies and interpretation of complex  data.   Critical Care Time devoted to patient care services, exclusive of separately billable procedures, described in this note is 35 minutes.   Chilton Greathouse MD [facility] See Amion for pager  If no response to pager , please call [phone] until 7pm After 7:00 pm call Elink  [phone] 06/29/2023, 12:15 PM

## 2023-06-29 NOTE — Progress Notes (Signed)
 Palliative:  HPI: 83 y.o. male with past medical history of HTN, HLD, diabetes, CKD stage 3b, prior CVA with no residual deficits, prostate cancer, failure to thrive admitted on 06/05/2023 with unresponsiveness with right-sided deficits found to have left MCA infarct with left M2 occlusion and left ICA near occlusion s/p IR with mechanical thrombectomy and stenting 2/5. 2/15 aspiration event resulting in severe shock requiring intubation. Hospitalization also complicated by acute GIB and CHF EF 30%.    Discussed with Dr. Isaiah Serge, Dr. Thedore Mins, and RN Amy. I followed up with wife at bedside after multiple discussions had with family from medical team. I reviewed with Mrs. Moreland. She shares with me that she has understanding. She supports full DNR at this time. She is more prepared today to give full control over to God. She finds solace in knowing they will be together for eternity after this life. She continues to lean on her faith for strength. She agrees with no further CRRT. We discussed and she agrees that we should extubate but she is not ready for this at this time. She will continue to think and pray but will consider extubation over the next 24 hours. She has been making calls to everyone to share her husband's situation and decline towards end of life. I applauded the amount of thought and prayer that she has put into trying to find peace and acceptance over the past few days acknowledging how difficult this has been for her. Ongoing emotional support provided.   Exam:  Ill appearing. Sedated - not following commands. HR 90s. Increasing vasopressor needs. Increasing ventilator support 80% FiO2. Extremities cool to touch.   Plan: - DNR - No further CRRT - Continue vent, vasopressors, tube feeding at this time - Wife is considering one way extubation  35 min  Yong Channel, NP Palliative Medicine Team Pager [phone] (Please see [email or web address] for schedule) Team Phone [phone]

## 2023-06-29 DEATH — deceased

## 2023-06-30 DIAGNOSIS — I639 Cerebral infarction, unspecified: Secondary | ICD-10-CM | POA: Diagnosis not present

## 2023-06-30 DIAGNOSIS — R579 Shock, unspecified: Secondary | ICD-10-CM | POA: Diagnosis not present

## 2023-06-30 DIAGNOSIS — J9601 Acute respiratory failure with hypoxia: Secondary | ICD-10-CM | POA: Diagnosis not present

## 2023-06-30 LAB — BASIC METABOLIC PANEL
Anion gap: 9 (ref 5–15)
BUN: 44 mg/dL — ABNORMAL HIGH (ref 8–23)
CO2: 23 mmol/L (ref 22–32)
Calcium: 9.2 mg/dL (ref 8.9–10.3)
Chloride: 100 mmol/L (ref 98–111)
Creatinine, Ser: 2.93 mg/dL — ABNORMAL HIGH (ref 0.61–1.24)
GFR, Estimated: 20 mL/min — ABNORMAL LOW (ref >60)
Glucose, Bld: 200 mg/dL — ABNORMAL HIGH (ref 70–99)
Potassium: 4.7 mmol/L (ref 3.5–5.1)
Sodium: 132 mmol/L — ABNORMAL LOW (ref 135–145)

## 2023-06-30 LAB — CBC
HCT: 22 % — ABNORMAL LOW (ref 39.0–52.0)
HCT: 23.6 % — ABNORMAL LOW (ref 39.0–52.0)
Hemoglobin: 6.7 g/dL — CL (ref 13.0–17.0)
Hemoglobin: 7.1 g/dL — ABNORMAL LOW (ref 13.0–17.0)
MCH: 30.3 pg (ref 26.0–34.0)
MCH: 29.8 pg (ref 26.0–34.0)
MCHC: 30.5 g/dL (ref 30.0–36.0)
MCHC: 30.1 g/dL (ref 30.0–36.0)
MCV: 99.5 fL (ref 80.0–100.0)
MCV: 99.2 fL (ref 80.0–100.0)
Platelets: 130 10*3/uL — ABNORMAL LOW (ref 150–400)
Platelets: 145 10*3/uL — ABNORMAL LOW (ref 150–400)
RBC: 2.21 MIL/uL — ABNORMAL LOW (ref 4.22–5.81)
RBC: 2.38 MIL/uL — ABNORMAL LOW (ref 4.22–5.81)
RDW: 16.1 % — ABNORMAL HIGH (ref 11.5–15.5)
RDW: 16.2 % — ABNORMAL HIGH (ref 11.5–15.5)
WBC: 10.4 10*3/uL (ref 4.0–10.5)
WBC: 11.3 10*3/uL — ABNORMAL HIGH (ref 4.0–10.5)
nRBC: 0 % (ref 0.0–0.2)
nRBC: 0 % (ref 0.0–0.2)

## 2023-06-30 LAB — GLUCOSE, CAPILLARY
Glucose-Capillary: 64 mg/dL — ABNORMAL LOW (ref 70–99)
Glucose-Capillary: 189 mg/dL — ABNORMAL HIGH (ref 70–99)
Glucose-Capillary: 198 mg/dL — ABNORMAL HIGH (ref 70–99)
Glucose-Capillary: 218 mg/dL — ABNORMAL HIGH (ref 70–99)
Glucose-Capillary: 239 mg/dL — ABNORMAL HIGH (ref 70–99)
Glucose-Capillary: 243 mg/dL — ABNORMAL HIGH (ref 70–99)
Glucose-Capillary: 233 mg/dL — ABNORMAL HIGH (ref 70–99)

## 2023-06-30 LAB — CULTURE, RESPIRATORY W GRAM STAIN
Culture: NORMAL
Gram Stain: NONE SEEN

## 2023-06-30 MED ORDER — SODIUM CHLORIDE 0.9 % IV SOLN
2.0000 g | INTRAVENOUS | Status: AC
  Administered 2023-06-30 – 2023-07-03 (×4): 2 g via INTRAVENOUS
  Filled 2023-06-30 (×4): qty 12.5

## 2023-06-30 NOTE — Progress Notes (Signed)
  [facility] Progress Note    Assessment/ Plan:   AKI on CKD 3A: Baseline creatinine 1.8.  Likely ATN in the setting of low EF and shock requiring pressors.   -CRRT start 2/22-2/23 however poor flows/catheter issues. R/s 2/23 with new catheter (appreciate CCM's assistance) -overall poor long term HD candidate. Originial plan was to do 3 day trial of CRRT which was completed on 2/26.  However, family wanted to move ahead with full scope treatment thereafter.  Palliative following, now DNR.  CRRT stopped 3/1 given worsening shock despite keeping net even. Will keep off unless anything changes and needs to be restarted--no indications currently. Poor prognosis and I don't really see a feasible end game in regards to maintaining quality of life especially given worsening shock/condition unless moving towards comfort care. Will be on stand by if CRRT is needed again, let me know. -as below discussed with wife. If any indications arise where we need to think about restarting CRRT, would recommend med mgmt to the best of your abilities. But try to hold off on restarting CRRT unless family requests it or if there is delay in regards to moving towards comfort care  -Continue to monitor daily Cr, Dose meds for GFR -Monitor Daily I/Os, Daily weight  -Maintain MAP>65 for optimal renal perfusion.  -Avoid nephrotoxic medications including NSAIDs -Use synthetic opioids (Fentanyl/Dilaudid) if needed   Acute hypoxic respiratory failure: Aspiration.  Ventilatory management per primary team   Klebsiella pneumonia: Antibiotics per primary team   CVA: Status post thrombectomy.  Mental status continues to be poor.  Continue to monitor   Heart failure with reduced ejection fraction: Manage volume status as above, however currently of CRRT   Uncontrolled type 2 diabetes with hyperglycemia: Management per primary team   Anemia: Multifactorial.  Transfusions per primary team  Shock: pressor support  per CCM  Discussed with wife at the bedside and CCM. DNR.   The patient is critically ill with CVA, AHRF, pneumonia, HFrEF, shock, AKI and which includes my role to primarily manage AKI.  This requires high complexity decision making.  Total critical care time: 55 min    Critical care time was exclusive of treating other patients.   Critical care was necessary to treat or prevent imminent or life-threatening deterioration.   Critical care was time spent personally by me on the following activities:   development of treatment plan with patient and/or surrogate as well as nursing,   discussions with other provider evaluation of patient's response to treatment  examination of patient  obtaining history from patient or surrogate  ordering and performing treatments and interventions  ordering and review of laboratory studies  ordering and review of radiographic studies   Subjective:   Patient seen and examined bedside. No acute events. Remains off CRRT and without indications to restart at this time Wife at bedside and we had a very lengthy conversation. Discussed his life prior to all of this. Discussed her wishes for him. She firmly maintains DNR which I agreed with. She is leaning towards extubation but waiting on family to come in from [place] so they can see him.  Pressors: NE, vaso   Objective:   BP (!) 121/50   Pulse 95   Temp 100.2 F (37.9 C) (Axillary)   Resp 13   Ht 5\' 10"  (1.778 m)   Wt 134.3 kg   SpO2 100%   BMI 42.48 kg/m   Intake/Output Summary (Last 24 hours) at 06/30/2023 1139 Last data filed  at 06/30/2023 0800 Gross per 24 hour  Intake 2118.89 ml  Output 1 ml  Net 2117.89 ml   Weight change:   Physical Exam: Gen: ill appearing, on vent/sedated CVS: RRR Resp: CTA B/L, intubated Abd: soft, nt/nd Ext: trace dependent edema LE Neuro: not following commands, unresp Dialysis access: right internal jugular temp HD catheter  Imaging: DG CHEST PORT 1  VIEW Result Date: 06/29/2023 CLINICAL DATA:  Hypoxemia. EXAM: PORTABLE CHEST 1 VIEW COMPARISON:  06/27/2023 FINDINGS: ET tube tip is above the carina. There is a right IJ catheter with tip at the distal SVC. Feeding tube courses below the level of the field of view. Stable cardiomediastinal contours. Persistent bilateral pleural effusions with bibasilar atelectasis. Persistent pulmonary vascular congestion. Interstitial edema is decreased from previous exam. IMPRESSION: 1. Persistent bilateral pleural effusions with bibasilar atelectasis. 2. Decreased interstitial edema. Electronically Signed   By: Signa Kell M.D.   On: 06/29/2023 10:30     Labs: BMET Recent Labs  Lab 06/26/23 0720 06/26/23 1539 06/27/23 0453 06/27/23 1537 06/28/23 0030 06/28/23 1535 06/29/23 0420 06/29/23 0617 06/30/23 0506  NA 137 134* 136 138 137 133* 136 135 132*  K 4.5 5.6* 5.1 4.9 4.9 4.8 4.3 4.2 4.7  CL 103 102 102 105 102 102 104  --  100  CO2 21* 24 24 24 25 23 28   --  23  GLUCOSE 171* 294* 148* 132* 100* 405* 131*  --  200*  BUN 29* 39* 36* 31* 28* 31* 28*  --  44*  CREATININE 1.97* 2.57* 2.32* 2.03* 2.00* 1.80* 1.74*  --  2.93*  CALCIUM 8.7* 8.3* 9.0 8.5* 9.4 8.2* 9.0  --  9.2  PHOS 2.8 2.8 2.7 3.0 3.0 2.4* 2.5  --   --    CBC Recent Labs  Lab 06/25/23 0541 06/27/23 0453 06/28/23 0028 06/29/23 0420 06/29/23 0617 06/30/23 0506 06/30/23 0551  WBC 14.3*   < > 19.8* 14.8*  --  10.4 11.3*  NEUTROABS 11.4*  --   --   --   --   --   --   HGB 8.0*   < > 8.8* 7.9* 8.5* 6.7* 7.1*  HCT 26.3*   < > 28.1* 25.3* 25.0* 22.0* 23.6*  MCV 100.8*   < > 95.9 97.7  --  99.5 99.2  PLT 132*   < > 209 153  --  130* 145*   < > = values in this interval not displayed.    Medications:     sodium chloride   Intravenous Once   sodium chloride   Intravenous Once   aspirin  81 mg Per Tube Daily   atorvastatin  80 mg Per Tube Daily   Chlorhexidine Gluconate Cloth  6 each Topical Daily   ezetimibe  10 mg Per Tube  Daily   feeding supplement (PROSource TF20)  60 mL Per Tube BID   heparin injection (subcutaneous)  5,000 Units Subcutaneous Q8H   insulin aspart  0-20 Units Subcutaneous Q4H   leptospermum manuka honey  1 Application Topical Daily   liver oil-zinc oxide   Topical BID   multivitamin  1 tablet Per Tube QHS   mouth rinse  15 mL Mouth Rinse Q2H   pantoprazole (PROTONIX) IV  40 mg Intravenous Q12H   QUEtiapine  100 mg Per Tube BID   sodium chloride flush  3 mL Intravenous Once      Anthony Sar, MD [facility] 06/30/2023, 11:39 AM

## 2023-06-30 NOTE — Plan of Care (Signed)
 Pt unable to tolerate reducing sedation medications, became very tachycardic in the 130's. Pt tolerating trickle TF. Pt needing suctioned numerous times with small amounts thick secretions noted. Pt tolerating reducing Levo throughout the day. Questionable if pt is making urine; unable to place any urine device due to tender skin at scrotum and bladder scan showing 0ml therefore keeping ABD pad in place to absorb any potential urine. ABD with some moisture on it in the afternoon when changing linen.

## 2023-06-30 NOTE — Progress Notes (Signed)
 PHARMACY NOTE:  ANTIMICROBIAL RENAL DOSAGE ADJUSTMENT  Current antimicrobial regimen includes a mismatch between antimicrobial dosage and estimated renal function.  As per policy approved by the Pharmacy & Therapeutics and Medical Executive Committees, the antimicrobial dosage will be adjusted accordingly.  Current antimicrobial dosage:  cefepime 2g q12   Indication: PNA  Renal Function:  Estimated Creatinine Clearance: 25.9 mL/min (A) (by C-G formula based on SCr of 2.93 mg/dL (H)).  Was On CRRT but stopped 3/1 due to hemodynamic intolerance. Anticipate continued worsening renal function     Antimicrobial dosage has been changed to:  cefepime 2g q24hr     Thank you for allowing pharmacy to be a part of this patient's care.  Leander Rams, [facility] 06/30/2023 9:40 AM

## 2023-06-30 NOTE — Progress Notes (Signed)
 NAME:  Nathan Proctor., MRN:  [id number], DOB:  [birth year], LOS: 25 ADMISSION DATE:  06/05/2023 CONSULTATION DATE:  06/05/2023 REFERRING MD:  Otelia Limes - Neuro, CHIEF COMPLAINT: Code Stroke, post-NIR   History of Present Illness:  83 year old man who presented to [facility] ED 2/5 via EMS for unresponsiveness and R-sided deficits. PMHx significant for HTN, HLD, prior CVA without residual deficits, T2DM, CKD stage IIIb, prostate CA, gout.  Patient presented to [facility] ED with significant R-sided deficits, aphasia and poor responsiveness. LKW 2200. Code Stroke initiated. On ED arrival, patient was afebrile with HR 86, BP 154/67, RR 20, SpO2 100%. Noted aphasia, R-sided hemiplegia, R-sided facial droop. Labs were notable for WBC 6.8. Hgb 11.9, Plt 181. INR 1.0. Na 142, K 3.8, CO2 20, Cr 1.59 (baseline), LFTs WNL. Ethanol < 10. COVID negative. CT Head with acute L MCA territory infarct affecting L insula/L frontal operculum, small chronic cortically-based infarcts of L frontal/L parietal/L occipital lobes. CTA Head/Neck with abrupt occlusion of proximal M2 L MCA vessel, ?2mm periophthalmic L ICA aneurysm, bilateral ICA plaque with progressive atherosclerotic plaque L carotid bifurcation with proximal L ICA severe, near-occlusive stenosis.   Taken to [facility] emergently for mechanical thrombectomy ([name]). TICI 3 revascularization achieved. L ICA stenosis and plaque rupture noted at the bulb, treated with stenting/angioplasty. Extubated post-procedure and maintained on cangrelor gtt with plan for transition to DAPT.  PCCM consulted for post-procedure medical management.  Pertinent Medical History:   Past Medical History:  Diagnosis Date   Anemia of chronic disease    Ankle fracture 02/15/2016   Cancer (HCC)    Prostate   Chronic constipation    Chronic kidney disease    stage III   Diabetes mellitus without complication (HCC)    type II    Diabetic peripheral neuropathy (HCC)    Failure to  thrive (0-17)    Fracture of left lower leg    Gout    Hyperlipidemia    Hypertension    Morbid obesity (HCC)    Unstable gait    Significant Hospital Events: Including procedures, antibiotic start and stop dates in addition to other pertinent events   2/5 - Presented to [facility] ED with poor responsiveness, aphasia, R-sided deficits. CT Head with L MCA territory infarct. CTA Head/Neck with occlusion of L MCA M2 segment and severe near-occlusive stenosis of L ICA. Taken to [facility] for MT, stent and angioplasty ([name]). PCCM consulted for post-procedure management. 2/6 transfer out of ICU 2/15 aspiration event early a.m. resulting in respiratory distress, transferred back to ICU and emergently intubated with development of severe shock requiring epi, levo, vaso. Transfused PRBC x1 for possible GI bleed 2/16: 3 pressors, weaning. Coox without cardiogenic component. Antibiotics vanc/cefepime. CT CAP with SBO  2/18: weaned off to only levo now which is likely sedation related  2/20: UOP drop overnight with ongoing poor renal function. Weaning sedation to evaluate neuro status.  2/22: started on trial of CRRT 2/23: HD catheter exchanged for longer one - flow improved. More awake by afternoon 2/24 tried to wean Precedex and pt got quite uncomfortable/agitated. Required Fentanyl infusion addition. Got hypotensive later in day, increase in NE  2/25 NE back down and almost off. Started pulling 50cc/hr from CRRT 2/26 remains on NE 11, CRRT ongoing 2/27: levo going up to 24, on vaso. CRRT net even. Made DNR today yesterday.  Received 2 units of PRBCs 3/1: CRRT turned off as blood pressure could not tolerate with increasing  pressor requirements  Interim History / Subjective:   Remains on the ventilator CRRT turned off yesterday as she was unable to tolerate  Objective:  Blood pressure (!) 121/50, pulse 95, temperature 100.2 F (37.9 C), temperature source Axillary, resp. rate 13, height 5'  10" (1.778 m), weight 134.3 kg, SpO2 100%.    Vent Mode: PRVC FiO2 (%):  [70 %-90 %] 70 % Set Rate:  [22 bmp] 22 bmp Vt Set:  [580 mL] 580 mL PEEP:  [8 cmH20] 8 cmH20 Plateau Pressure:  [18 cmH20-21 cmH20] 18 cmH20   Intake/Output Summary (Last 24 hours) at 06/30/2023 0940 Last data filed at 06/30/2023 0800 Gross per 24 hour  Intake 2158.82 ml  Output 1 ml  Net 2157.82 ml   Filed Weights   06/26/23 0500 06/27/23 0500 06/29/23 0500  Weight: 132.4 kg (!) 136.1 kg 134.3 kg   Physical Exam: Blood pressure (!) 121/50, pulse 95, temperature 100.2 F (37.9 C), temperature source Axillary, resp. rate 13, height 5\' 10"  (1.778 m), weight 134.3 kg, SpO2 100%. Gen:      No acute distress, chronically ill-appearing HEENT:  EOMI, sclera anicteric Neck:     No masses; no thyromegaly ET tube Lungs:    Clear to auscultation bilaterally; normal respiratory effort CV:         Regular rate and rhythm; no murmurs Abd:      + bowel sounds; soft, non-tender; no palpable masses, no distension Ext:    No edema; adequate peripheral perfusion Skin:      Warm and dry; no rash Neuro: Unresponsive  Lab/imaging reviewed Sodium 132, BUN/creatinine 44/2.93 WBC 11.3, hemoglobin 7.1, platelets 145  Assessment & Plan:Resolved Hospital Problem List   Acute hypoxemic respiratory failure due to aspiration Klebsiella pneumonia  - treated.  Ileus and swallowing dysfunction from stroke probably contributed.  Left MCA M2 occlusion and left ICA near occlusion now status post thrombectomy with complete revascularization Likely secondary to large vessel obstruction Heart failure with reduced ejection fraction ejection fraction of 30% with global hypokinesis, normal right ventricular function Hypertension Hyperlipidemia Type 2 diabetes Acute kidney injury superimposed on chronic kidney disease stage IIIb NSVT AM 2/25 Acute blood loss anemia, unclear etiology; may be stress ulcer related vs critical illness. No bloody  BM, or OGT output.  Goals of care - poor prognosis for meaningful recovery and could be headed towards trach and possibly ESRD requiring HD  Plan: Worsening multiorgan failure Refractory shock with increasing pressor requirements.  Not able to tolerate CRRT Continue broad antibiotic coverage Unable to wean on the ventilator due to unstable state Continue pressors  Goals of care Discussed with family, nephrology, palliative care We have stopped CRRT as he is unable to tolerate it due to hemodynamic status with no plans to resume Family understands but do not want to transition him to comfort measures yet They confirm no CPR and defib but they want him to remain on the ventilator for now.  Best Practice (right click and "Reselect all SmartList Selections" daily)   Diet/type: tubefeeds and NPO DVT prophylaxis DOAC, SCD's Pressure ulcer(s): N/A GI prophylaxis: PPI Lines: Central line and yes and it is still needed Foley:  Yes, and it is still needed Code Status:  DNR Last date of multidisciplinary goals of care discussion: ongoing palliative discussion. DNR  The patient is critically ill with multiple organ system failure and requires high complexity decision making for assessment and support, frequent evaluation and titration of therapies, advanced monitoring, review of radiographic  studies and interpretation of complex data.   Critical Care Time devoted to patient care services, exclusive of separately billable procedures, described in this note is 35 minutes.   Chilton Greathouse MD [place] [facility] See Amion for pager  If no response to pager , please call [phone] until 7pm After 7:00 pm call Elink  [phone] 06/30/2023, 9:40 AM

## 2023-06-30 NOTE — Plan of Care (Signed)
Poor prognosis.  

## 2023-07-01 DIAGNOSIS — K567 Ileus, unspecified: Secondary | ICD-10-CM | POA: Diagnosis not present

## 2023-07-01 DIAGNOSIS — I639 Cerebral infarction, unspecified: Secondary | ICD-10-CM | POA: Diagnosis not present

## 2023-07-01 DIAGNOSIS — J9601 Acute respiratory failure with hypoxia: Secondary | ICD-10-CM | POA: Diagnosis not present

## 2023-07-01 DIAGNOSIS — N1832 Chronic kidney disease, stage 3b: Secondary | ICD-10-CM | POA: Diagnosis not present

## 2023-07-01 LAB — CBC
HCT: 21.4 % — ABNORMAL LOW (ref 39.0–52.0)
HCT: 24 % — ABNORMAL LOW (ref 39.0–52.0)
Hemoglobin: 6.6 g/dL — CL (ref 13.0–17.0)
Hemoglobin: 7.4 g/dL — ABNORMAL LOW (ref 13.0–17.0)
MCH: 30.3 pg (ref 26.0–34.0)
MCH: 29 pg (ref 26.0–34.0)
MCHC: 30.8 g/dL (ref 30.0–36.0)
MCHC: 30.8 g/dL (ref 30.0–36.0)
MCV: 98.2 fL (ref 80.0–100.0)
MCV: 94.1 fL (ref 80.0–100.0)
Platelets: 122 10*3/uL — ABNORMAL LOW (ref 150–400)
Platelets: 114 10*3/uL — ABNORMAL LOW (ref 150–400)
RBC: 2.18 MIL/uL — ABNORMAL LOW (ref 4.22–5.81)
RBC: 2.55 MIL/uL — ABNORMAL LOW (ref 4.22–5.81)
RDW: 15.9 % — ABNORMAL HIGH (ref 11.5–15.5)
RDW: 17.2 % — ABNORMAL HIGH (ref 11.5–15.5)
WBC: 10.9 10*3/uL — ABNORMAL HIGH (ref 4.0–10.5)
WBC: 9.4 10*3/uL (ref 4.0–10.5)
nRBC: 0 % (ref 0.0–0.2)
nRBC: 0 % (ref 0.0–0.2)

## 2023-07-01 LAB — GLUCOSE, CAPILLARY
Glucose-Capillary: 219 mg/dL — ABNORMAL HIGH (ref 70–99)
Glucose-Capillary: 167 mg/dL — ABNORMAL HIGH (ref 70–99)
Glucose-Capillary: 174 mg/dL — ABNORMAL HIGH (ref 70–99)
Glucose-Capillary: 186 mg/dL — ABNORMAL HIGH (ref 70–99)
Glucose-Capillary: 230 mg/dL — ABNORMAL HIGH (ref 70–99)
Glucose-Capillary: 227 mg/dL — ABNORMAL HIGH (ref 70–99)

## 2023-07-01 LAB — COOXEMETRY PANEL
Carboxyhemoglobin: 3.7 % — ABNORMAL HIGH (ref 0.5–1.5)
Methemoglobin: <0.7 % (ref 0.0–1.5)
O2 Saturation: 70.2 %
Total hemoglobin: 7.4 g/dL — ABNORMAL LOW (ref 12.0–16.0)

## 2023-07-01 LAB — CALCIUM, IONIZED: Calcium, Ionized, Serum: 4.9 mg/dL (ref 4.5–5.6)

## 2023-07-01 LAB — PREPARE RBC (CROSSMATCH)

## 2023-07-01 LAB — HEMOGLOBIN AND HEMATOCRIT, BLOOD
HCT: 20.7 % — ABNORMAL LOW (ref 39.0–52.0)
HCT: 22.8 % — ABNORMAL LOW (ref 39.0–52.0)
Hemoglobin: 6.4 g/dL — CL (ref 13.0–17.0)
Hemoglobin: 7.3 g/dL — ABNORMAL LOW (ref 13.0–17.0)

## 2023-07-01 LAB — BASIC METABOLIC PANEL
Anion gap: 8 (ref 5–15)
BUN: 61 mg/dL — ABNORMAL HIGH (ref 8–23)
CO2: 22 mmol/L (ref 22–32)
Calcium: 8.9 mg/dL (ref 8.9–10.3)
Chloride: 101 mmol/L (ref 98–111)
Creatinine, Ser: 4.15 mg/dL — ABNORMAL HIGH (ref 0.61–1.24)
GFR, Estimated: 13 mL/min — ABNORMAL LOW (ref >60)
Glucose, Bld: 210 mg/dL — ABNORMAL HIGH (ref 70–99)
Potassium: 4.7 mmol/L (ref 3.5–5.1)
Sodium: 131 mmol/L — ABNORMAL LOW (ref 135–145)

## 2023-07-01 MED ORDER — INSULIN ASPART 100 UNIT/ML IJ SOLN
3.0000 [IU] | INTRAMUSCULAR | Status: DC
  Administered 2023-07-01 – 2023-07-02 (×6): 3 [IU] via SUBCUTANEOUS

## 2023-07-01 MED ORDER — SODIUM CHLORIDE 0.9% IV SOLUTION: Freq: Once | INTRAVENOUS | Status: DC

## 2023-07-01 NOTE — Progress Notes (Signed)
 NAME:  Nathan Proctor., MRN:  [id number], DOB:  [birth year], LOS: 26 ADMISSION DATE:  06/05/2023 CONSULTATION DATE:  06/05/2023 REFERRING MD:  Otelia Limes - Neuro, CHIEF COMPLAINT: Code Stroke, post-NIR   History of Present Illness:  83 year old man who presented to [facility] ED 2/5 via EMS for unresponsiveness and R-sided deficits. PMHx significant for HTN, HLD, prior CVA without residual deficits, T2DM, CKD stage IIIb, prostate CA, gout.  Patient presented to [facility] ED with significant R-sided deficits, aphasia and poor responsiveness. LKW 2200. Code Stroke initiated. On ED arrival, patient was afebrile with HR 86, BP 154/67, RR 20, SpO2 100%. Noted aphasia, R-sided hemiplegia, R-sided facial droop. Labs were notable for WBC 6.8. Hgb 11.9, Plt 181. INR 1.0. Na 142, K 3.8, CO2 20, Cr 1.59 (baseline), LFTs WNL. Ethanol < 10. COVID negative. CT Head with acute L MCA territory infarct affecting L insula/L frontal operculum, small chronic cortically-based infarcts of L frontal/L parietal/L occipital lobes. CTA Head/Neck with abrupt occlusion of proximal M2 L MCA vessel, ?2mm periophthalmic L ICA aneurysm, bilateral ICA plaque with progressive atherosclerotic plaque L carotid bifurcation with proximal L ICA severe, near-occlusive stenosis.   Taken to [facility] emergently for mechanical thrombectomy ([name]). TICI 3 revascularization achieved. L ICA stenosis and plaque rupture noted at the bulb, treated with stenting/angioplasty. Extubated post-procedure and maintained on cangrelor gtt with plan for transition to DAPT.  PCCM consulted for post-procedure medical management.  Pertinent Medical History:   Past Medical History:  Diagnosis Date   Anemia of chronic disease    Ankle fracture 02/15/2016   Cancer (HCC)    Prostate   Chronic constipation    Chronic kidney disease    stage III   Diabetes mellitus without complication (HCC)    type II    Diabetic peripheral neuropathy (HCC)    Failure to  thrive (0-17)    Fracture of left lower leg    Gout    Hyperlipidemia    Hypertension    Morbid obesity (HCC)    Unstable gait    Significant Hospital Events: Including procedures, antibiotic start and stop dates in addition to other pertinent events   2/5 - Presented to [facility] ED with poor responsiveness, aphasia, R-sided deficits. CT Head with L MCA territory infarct. CTA Head/Neck with occlusion of L MCA M2 segment and severe near-occlusive stenosis of L ICA. Taken to [facility] for MT, stent and angioplasty ([name]). PCCM consulted for post-procedure management. 2/6 transfer out of ICU 2/15 aspiration event early a.m. resulting in respiratory distress, transferred back to ICU and emergently intubated with development of severe shock requiring epi, levo, vaso. Transfused PRBC x1 for possible GI bleed 2/16: 3 pressors, weaning. Coox without cardiogenic component. Antibiotics vanc/cefepime. CT CAP with SBO  2/18: weaned off to only levo now which is likely sedation related  2/20: UOP drop overnight with ongoing poor renal function. Weaning sedation to evaluate neuro status.  2/22: started on trial of CRRT 2/23: HD catheter exchanged for longer one - flow improved. More awake by afternoon 2/24 tried to wean Precedex and pt got quite uncomfortable/agitated. Required Fentanyl infusion addition. Got hypotensive later in day, increase in NE  2/25 NE back down and almost off. Started pulling 50cc/hr from CRRT 2/26 remains on NE 11, CRRT ongoing 2/27: levo going up to 24, on vaso. CRRT net even. Made DNR today yesterday.  Received 2 units of PRBCs 3/1: CRRT turned off as blood pressure could not tolerate with increasing  pressor requirements   Interim History / Subjective:   Remains on high dose pressors with levophed and vasopressin Transfused 1 U PRBC  Objective:  Blood pressure (!) 116/55, pulse (!) 102, temperature 99.6 F (37.6 C), temperature source Axillary, resp. rate (!) 22,  height 5\' 10"  (1.778 m), weight (!) 139.8 kg, SpO2 95%.    Vent Mode: PRVC FiO2 (%):  [40 %-60 %] 40 % Set Rate:  [22 bmp] 22 bmp Vt Set:  [580 mL] 580 mL PEEP:  [8 cmH20] 8 cmH20 Plateau Pressure:  [19 cmH20-20 cmH20] 20 cmH20   Intake/Output Summary (Last 24 hours) at 07/01/2023 0920 Last data filed at 07/01/2023 0800 Gross per 24 hour  Intake 1964.21 ml  Output 35 ml  Net 1929.21 ml   Filed Weights   06/27/23 0500 06/29/23 0500 07/01/23 0500  Weight: (!) 136.1 kg 134.3 kg (!) 139.8 kg   Physical Exam: General: Critically ill-appearing, no acute distress HENT: [place], AT, ETT in place Eyes: EOMI, no scleral icterus Respiratory: Clear to auscultation bilaterally.  No crackles, wheezing or rales Cardiovascular: RRR, -M/R/G, no JVD GI: BS+, soft, nontender Extremities:-Edema,-tenderness Neuro: Sedated GU: Foley in place  Imaging, labs and test noted above have been reviewed independently by me. Na 131 BUN/Cr 61/4.15 WBC 10.9 Hg 6.4  CXR 3/1 Bilateral pleural effusions with bibasilar atelectasis, interstitial edema improved compared to prior   Assessment & Plan:Resolved Hospital Problem List   Acute hypoxemic respiratory failure due to aspiration Klebsiella pneumonia  - treated.  Ileus and swallowing dysfunction from stroke probably contributed.  Left MCA M2 occlusion and left ICA near occlusion now status post thrombectomy with complete revascularization Likely secondary to large vessel obstruction Heart failure with reduced ejection fraction ejection fraction of 30% with global hypokinesis, normal right ventricular function Hypertension Hyperlipidemia Type 2 diabetes Acute kidney injury superimposed on chronic kidney disease stage IIIb NSVT AM 2/25 Acute blood loss anemia, unclear etiology; may be stress ulcer related vs critical illness. No bloody BM, or OGT output.  Goals of care - poor prognosis for meaningful recovery and could be headed towards trach and possibly  ESRD requiring HD  Plan: Multiorgan failure and unable to tolerate CRRT due to refractory shock Continue broad antibiotic coverage Not a candidate for weaning due to unstable critical condition Continue to wean pressors for goal MAP >65 Recheck coox Minimize sedation for RASS goal -1 Holding plavix for acute blood loss anemia. S/p PRBC x 1. F/u post-transfusion H/H  Goals of care Discussed with family, nephrology, palliative care We have stopped CRRT as he is unable to tolerate it due to hemodynamic status with no plans to resume Wife expresses understanding and is considering withdrawal of care but do not want to transition him to comfort measures yet. Awaiting family to come together They confirm no CPR and defib but they want him to remain on the ventilator for now.  Best Practice (right click and "Reselect all SmartList Selections" daily)   Diet/type: tubefeeds and NPO DVT prophylaxis SCD Pressure ulcer(s): N/A GI prophylaxis: PPI Lines: Central line and yes and it is still needed Foley:  Yes, and it is still needed Code Status:  DNR Last date of multidisciplinary goals of care discussion: ongoing palliative discussion. DNR   The patient is critically ill with multiple organ systems failure and requires high complexity decision making for assessment and support, frequent evaluation and titration of therapies, application of advanced monitoring technologies and extensive interpretation of multiple databases.  Independent Critical Care  Time: 40 Minutes.   Mechele Collin, M.D. [facility] Pulmonary/Critical Care Medicine 07/01/2023 9:21 AM   Please see Amion for pager number to reach on-call Pulmonary and Critical Care Team.

## 2023-07-01 NOTE — Progress Notes (Signed)
 Nutrition Follow-up  DOCUMENTATION CODES:   Non-severe (moderate) malnutrition in context of chronic illness  INTERVENTION:   Tube feeding via cortrak tube:  Increase Osmolite 1.5 by 10 ml every 6 hours to goal rate of 50 ml/hr (1200 ml per day) Prosource TF20 60 ml BID  Provides 1960 kcal, 115 gm protein, 912 ml free water daily  Rena-vit daily   NUTRITION DIAGNOSIS:   Moderate Malnutrition related to chronic illness as evidenced by severe muscle depletion, mild fat depletion, severe fat depletion. Ongoing.   GOAL:   Patient will meet greater than or equal to 90% of their needs Progressing with TF advancement   MONITOR:   TF tolerance, Weight trends, Labs, Diet advancement  REASON FOR ASSESSMENT:   Consult Enteral/tube feeding initiation and management  ASSESSMENT:   Pt admitted for acute ischemic stroke with right sides weakness, right facial droop, and aphagia. Pt with PMH of HTN, DM, CKD III, prior CVA w/ no residual deficits.  Pt discussed during ICU rounds and with RN and MD.  TF restarted at trickle rate on Sunday, currently infusing at 10 ml/hr. Discussed with MD, ok to start advancing TF.  Medical team continues to discuss GOC with family. Per Renal no plans for further RRT after discussion with family    2/05 - s/p IR mechanical thrombectomy and carotid stent placement, Cortrak placed (tip gastric) 2/07 - s/p MBS with recommendation for dysphagia 1 diet with honey-thick liquids, Transferred to progressive unit 2/09 - Cortrak and tube feeds discontinued 2/12 - repeat MBS with recommendation for dysphagia 2 diet with thin liquids 2/15 - Transferred to ICU for respiratory distress, Intubated  2/16 -  CT abdomen concerning for either close loop obstruction or internal hernia 2/18 - start trickle TF 2/21 - s/p cortrak placement, tip gastric; TF to goal  2/28 - TF held for emesis, high pressor requirement 3/1 - CRRT stopped 3/2 - trickle TF started 3/3 -  start to advance TF to goal  Medications reviewed and include: SSI every 4 hours, rena-vit, protonix,  Fentanyl  Versed Levophed @ 15 mcg Vasopressin 0.04  Labs reviewed:  Na 131 BUN 44 -> 61 Cr 1.74 -> 2.93 ->4.15 CBG's: 174-243   UOP: 0, 1 unmeasured occurrence   Current weight: 139.8 kg Admission weight: 132.6 kg   Diet Order:   Diet Order             Diet NPO time specified  Diet effective now                   EDUCATION NEEDS:   Education needs have been addressed  Skin:  Skin Assessment: Skin Integrity Issues: Skin Integrity Issues:: Stage II Stage II: sacrum, coccyx Stage III: L scrotum Incisions: R thigh  Last BM:  3/3 small; type 7  Height:   Ht Readings from Last 1 Encounters:  06/25/23 5\' 10"  (1.778 m)    Weight:   Wt Readings from Last 1 Encounters:  07/01/23 (!) 139.8 kg    Ideal Body Weight:  68.2 kg  BMI:  Body mass index is 44.22 kg/m.  Estimated Nutritional Needs:   Kcal:  1950-2150 kcals  Protein:  115-130 grams  Fluid:  1.8 L  Mirta Mally P., RD, LDN, CNSC See AMiON for contact information

## 2023-07-01 NOTE — Progress Notes (Signed)
 [facility] Progress Note    Assessment/ Plan:   AKI on CKD 3A: Baseline creatinine 1.8.  Likely ATN in the setting of low EF and shock requiring pressors.   -CRRT start 2/22-2/23 however poor flows/catheter issues. R/s 2/23 with new catheter (appreciate CCM's assistance) -overall poor long term HD candidate. Originial plan was to do 3 day trial of CRRT which was completed on 2/26.  However, family wanted to move ahead with full scope treatment thereafter.  Palliative following, now DNR.  CRRT stopped 3/1 given worsening shock despite keeping net even. -Afther further GOC discussions, no further RRT at this time, faimly / wife in agreement.  -Continue to monitor daily Cr, Dose meds for GFR -Monitor Daily I/Os, Daily weight  -Maintain MAP>65 for optimal renal perfusion.  -Avoid nephrotoxic medications including NSAIDs -Use synthetic opioids (Fentanyl/Dilaudid) if needed   Acute hypoxic respiratory failure: Aspiration.  Ventilatory management per CCM   Klebsiella pneumonia: Antibiotics per primary team   CVA: Status post thrombectomy.  Mental status continues to be poor.  Continue to monitor   Heart failure with reduced ejection fraction: Per CCM   Uncontrolled type 2 diabetes with hyperglycemia: Management per primary team   Anemia: Multifactorial.  Transfusions per primary team  Shock: pressor support per CCM  Discussed with wife at the bedside, primary RN, and CCM. DNR.  Will sign off at this time. Call with questions or changes I nstatus. RS    Subjective:    Patient seen and examined bedside. No acute events. Remains off CRRT and without indications to restart at this time Wife at bedside, discussed. She is intending to have family arrive and then will likely transition to comfort measures She understands that futhre HD is of little meaningful value and doesn't want to pursue    Objective:   BP (!) 116/55   Pulse (!) 102   Temp 99.6 F (37.6 C)  (Axillary)   Resp (!) 22   Ht 5\' 10"  (1.778 m)   Wt (!) 139.8 kg   SpO2 95%   BMI 44.22 kg/m   Intake/Output Summary (Last 24 hours) at 07/01/2023 1016 Last data filed at 07/01/2023 0800 Gross per 24 hour  Intake 1804.93 ml  Output 35 ml  Net 1769.93 ml   Weight change:   Physical Exam: Gen: ill appearing, on vent/sedated CVS: RRR Resp: CTA B/L, intubated Abd: soft, nt/nd Ext: trace dependent edema LE Neuro: not following commands, unresp Dialysis access: right internal jugular temp HD catheter  Imaging: No results found.    Labs: BMET Recent Labs  Lab 06/26/23 0720 06/26/23 1539 06/27/23 0453 06/27/23 1537 06/28/23 0030 06/28/23 1535 06/29/23 0420 06/29/23 0617 06/30/23 0506 07/01/23 0419  NA 137 134* 136 138 137 133* 136 135 132* 131*  K 4.5 5.6* 5.1 4.9 4.9 4.8 4.3 4.2 4.7 4.7  CL 103 102 102 105 102 102 104  --  100 101  CO2 21* 24 24 24 25 23 28   --  23 22  GLUCOSE 171* 294* 148* 132* 100* 405* 131*  --  200* 210*  BUN 29* 39* 36* 31* 28* 31* 28*  --  44* 61*  CREATININE 1.97* 2.57* 2.32* 2.03* 2.00* 1.80* 1.74*  --  2.93* 4.15*  CALCIUM 8.7* 8.3* 9.0 8.5* 9.4 8.2* 9.0  --  9.2 8.9  PHOS 2.8 2.8 2.7 3.0 3.0 2.4* 2.5  --   --   --    CBC Recent Labs  Lab 06/25/23 0541 06/27/23  1610 06/29/23 0420 06/29/23 0617 06/30/23 0506 06/30/23 0551 07/01/23 0419 07/01/23 0535  WBC 14.3*   < > 14.8*  --  10.4 11.3* 10.9*  --   NEUTROABS 11.4*  --   --   --   --   --   --   --   HGB 8.0*   < > 7.9*   < > 6.7* 7.1* 6.6* 6.4*  HCT 26.3*   < > 25.3*   < > 22.0* 23.6* 21.4* 20.7*  MCV 100.8*   < > 97.7  --  99.5 99.2 98.2  --   PLT 132*   < > 153  --  130* 145* 122*  --    < > = values in this interval not displayed.    Medications:     sodium chloride   Intravenous Once   aspirin  81 mg Per Tube Daily   atorvastatin  80 mg Per Tube Daily   Chlorhexidine Gluconate Cloth  6 each Topical Daily   ezetimibe  10 mg Per Tube Daily   feeding supplement  (PROSource TF20)  60 mL Per Tube BID   heparin injection (subcutaneous)  5,000 Units Subcutaneous Q8H   insulin aspart  0-20 Units Subcutaneous Q4H   insulin aspart  3 Units Subcutaneous Q4H   leptospermum manuka honey  1 Application Topical Daily   liver oil-zinc oxide   Topical BID   multivitamin  1 tablet Per Tube QHS   mouth rinse  15 mL Mouth Rinse Q2H   pantoprazole (PROTONIX) IV  40 mg Intravenous Q12H   sodium chloride flush  3 mL Intravenous Once      Anthony Sar, MD [facility] 07/01/2023, 10:16 AM

## 2023-07-01 NOTE — Plan of Care (Signed)
 Decreased sedation medications significantly throughout the shift with no improvement on pt response. Pt does become tachycardic with much stimulation; turning or in-line suctioning. Continued to decreased Levo throughout shift. Pt did have what appeared to be urine, yellow discoloration, on washcloth by the end of shift however bladder scan still showing 0.

## 2023-07-01 NOTE — TOC Progression Note (Signed)
 Transition of Care (TOC) - Progression Note    Patient Details  Name: Nathan Proctor. MRN: [id number] Date of Birth: March 18, 1941  Transition of Care [facility]) CM/SW Contact  Mearl Latin, LCSW Phone Number: 07/01/2023, 8:27 AM  Clinical Narrative:    TOC continuing to follow for needs.    Expected Discharge Plan:  Barriers to Discharge: Continued Medical Work up  Expected Discharge Plan and Services In-house Referral: Clinical Social Work   Post Acute Care Choice: Skilled Nursing Facility Living arrangements for the past 2 months: Single Family Home                                       Social Determinants of Health (SDOH) Interventions SDOH Screenings   Food Insecurity: Patient Unable To Answer (06/07/2023)  Housing: Patient Unable To Answer (06/07/2023)  Transportation Needs: Patient Unable To Answer (06/07/2023)  Utilities: Patient Unable To Answer (06/07/2023)  Depression (PHQ2-9): Low Risk  (12/03/2019)  Social Connections: Patient Unable To Answer (06/07/2023)  Tobacco Use: Medium Risk (06/13/2023)    Readmission Risk Interventions    06/26/2023    5:05 PM  Readmission Risk Prevention Plan  Transportation Screening Complete  Medication Review (RN Care Manager) Complete  PCP or Specialist appointment within 3-5 days of discharge Complete  HRI or Home Care Consult Complete  SW Recovery Care/Counseling Consult Complete  Palliative Care Screening Complete  Skilled Nursing Facility Complete

## 2023-07-01 NOTE — Progress Notes (Addendum)
 eLink Physician-Brief Progress Note Patient Name: Nathan Proctor. DOB: 1940/05/26 MRN: [id number]   Date of Service  07/01/2023  HPI/Events of Note  Hg 6.6, no active bleeding. Off of CRRT.   eICU Interventions  Repeat Hg/hct again. Same thing happened yesterday AM. Discussed with RN.      Intervention Category Intermediate Interventions: Other:  Ranee Gosselin  07/01/2023, 4:47 AM  06:26 Repeat Hg < 6.5 1 PRBC ordered, follow Hg post transfusion at 12 noon. On protonix q12 hr already.  Get FOBT x 3.

## 2023-07-02 ENCOUNTER — Inpatient Hospital Stay (HOSPITAL_COMMUNITY)

## 2023-07-02 DIAGNOSIS — Z7189 Other specified counseling: Secondary | ICD-10-CM | POA: Diagnosis not present

## 2023-07-02 DIAGNOSIS — I63512 Cerebral infarction due to unspecified occlusion or stenosis of left middle cerebral artery: Secondary | ICD-10-CM | POA: Diagnosis not present

## 2023-07-02 DIAGNOSIS — I639 Cerebral infarction, unspecified: Secondary | ICD-10-CM | POA: Diagnosis not present

## 2023-07-02 DIAGNOSIS — K567 Ileus, unspecified: Secondary | ICD-10-CM | POA: Diagnosis not present

## 2023-07-02 DIAGNOSIS — J9601 Acute respiratory failure with hypoxia: Secondary | ICD-10-CM | POA: Diagnosis not present

## 2023-07-02 DIAGNOSIS — Z515 Encounter for palliative care: Secondary | ICD-10-CM | POA: Diagnosis not present

## 2023-07-02 DIAGNOSIS — N179 Acute kidney failure, unspecified: Secondary | ICD-10-CM | POA: Diagnosis not present

## 2023-07-02 DIAGNOSIS — E871 Hypo-osmolality and hyponatremia: Secondary | ICD-10-CM

## 2023-07-02 DIAGNOSIS — N1832 Chronic kidney disease, stage 3b: Secondary | ICD-10-CM | POA: Diagnosis not present

## 2023-07-02 LAB — COMPREHENSIVE METABOLIC PANEL
ALT: 27 U/L (ref 0–44)
AST: 33 U/L (ref 15–41)
Albumin: 1.7 g/dL — ABNORMAL LOW (ref 3.5–5.0)
Alkaline Phosphatase: 47 U/L (ref 38–126)
Anion gap: 10 (ref 5–15)
BUN: 84 mg/dL — ABNORMAL HIGH (ref 8–23)
CO2: 19 mmol/L — ABNORMAL LOW (ref 22–32)
Calcium: 9 mg/dL (ref 8.9–10.3)
Chloride: 101 mmol/L (ref 98–111)
Creatinine, Ser: 5.49 mg/dL — ABNORMAL HIGH (ref 0.61–1.24)
GFR, Estimated: 10 mL/min — ABNORMAL LOW (ref >60)
Glucose, Bld: 241 mg/dL — ABNORMAL HIGH (ref 70–99)
Potassium: 5.1 mmol/L (ref 3.5–5.1)
Sodium: 130 mmol/L — ABNORMAL LOW (ref 135–145)
Total Bilirubin: 1.3 mg/dL — ABNORMAL HIGH (ref 0.0–1.2)
Total Protein: 5.8 g/dL — ABNORMAL LOW (ref 6.5–8.1)

## 2023-07-02 LAB — TYPE AND SCREEN
ABO/RH(D): A NEG
Antibody Screen: NEGATIVE
Unit division: 0

## 2023-07-02 LAB — CBC
HCT: 23.3 % — ABNORMAL LOW (ref 39.0–52.0)
Hemoglobin: 7.1 g/dL — ABNORMAL LOW (ref 13.0–17.0)
MCH: 29.6 pg (ref 26.0–34.0)
MCHC: 30.5 g/dL (ref 30.0–36.0)
MCV: 97.1 fL (ref 80.0–100.0)
Platelets: 108 10*3/uL — ABNORMAL LOW (ref 150–400)
RBC: 2.4 MIL/uL — ABNORMAL LOW (ref 4.22–5.81)
RDW: 16.9 % — ABNORMAL HIGH (ref 11.5–15.5)
WBC: 9.5 10*3/uL (ref 4.0–10.5)
nRBC: 0 % (ref 0.0–0.2)

## 2023-07-02 LAB — BPAM RBC
Blood Product Expiration Date: 202503182359
ISSUE DATE / TIME: 202503030837
Unit Type and Rh: 600

## 2023-07-02 LAB — GLUCOSE, CAPILLARY
Glucose-Capillary: 198 mg/dL — ABNORMAL HIGH (ref 70–99)
Glucose-Capillary: 260 mg/dL — ABNORMAL HIGH (ref 70–99)
Glucose-Capillary: 307 mg/dL — ABNORMAL HIGH (ref 70–99)
Glucose-Capillary: 291 mg/dL — ABNORMAL HIGH (ref 70–99)
Glucose-Capillary: 311 mg/dL — ABNORMAL HIGH (ref 70–99)
Glucose-Capillary: 325 mg/dL — ABNORMAL HIGH (ref 70–99)

## 2023-07-02 LAB — MAGNESIUM: Magnesium: 2.5 mg/dL — ABNORMAL HIGH (ref 1.7–2.4)

## 2023-07-02 MED ORDER — INSULIN ASPART 100 UNIT/ML IJ SOLN
5.0000 [IU] | INTRAMUSCULAR | Status: DC
Start: 2023-07-02 — End: 2023-07-03
  Administered 2023-07-02 – 2023-07-03 (×6): 5 [IU] via SUBCUTANEOUS

## 2023-07-02 MED ORDER — ALTEPLASE 2 MG IJ SOLR
2.0000 mg | Freq: Once | INTRAMUSCULAR | Status: AC
  Administered 2023-07-02: 2 mg
  Filled 2023-07-02: qty 2

## 2023-07-02 NOTE — Progress Notes (Signed)
 STROKE TEAM PROGRESS NOTE   SUBJECTIVE (INTERVAL HISTORY) Granddaughter at the bedside.  RN found this morning patient pupil lack of capillary reflexes, promoted repeat CT which showed no acute changes but evolving left MCA infarcts with petechial hemorrhage and laminar necrosis.  OBJECTIVE Temp:  [98.2 F (36.8 C)-99.6 F (37.6 C)] 98.3 F (36.8 C) (03/04 1600) Pulse Rate:  [103-118] 106 (03/04 1845) Cardiac Rhythm: Sinus tachycardia (03/04 1200) Resp:  [0-37] 24 (03/04 1900) BP: (87-139)/(34-88) 122/57 (03/04 1900) SpO2:  [91 %-100 %] 100 % (03/04 1845) FiO2 (%):  [40 %] 40 % (03/04 1326) Weight:  [141.2 kg] 141.2 kg (03/04 0500)  Recent Labs  Lab 07/02/23 0325 07/02/23 0800 07/02/23 1205 07/02/23 1612 07/02/23 1910  GLUCAP 198* 260* 307* 291* 311*   Recent Labs  Lab 06/26/23 0720 06/26/23 1539 06/27/23 0453 06/27/23 1537 06/28/23 0028 06/28/23 0030 06/28/23 1535 06/29/23 0420 06/29/23 0617 06/30/23 0506 07/01/23 0419 07/02/23 0444  NA 137   < > 136 138  --  137 133* 136 135 132* 131* 130*  K 4.5   < > 5.1 4.9  --  4.9 4.8 4.3 4.2 4.7 4.7 5.1  CL 103   < > 102 105  --  102 102 104  --  100 101 101  CO2 21*   < > 24 24  --  25 23 28   --  23 22 19*  GLUCOSE 171*   < > 148* 132*  --  100* 405* 131*  --  200* 210* 241*  BUN 29*   < > 36* 31*  --  28* 31* 28*  --  44* 61* 84*  CREATININE 1.97*   < > 2.32* 2.03*  --  2.00* 1.80* 1.74*  --  2.93* 4.15* 5.49*  CALCIUM 8.7*   < > 9.0 8.5*  --  9.4 8.2* 9.0  --  9.2 8.9 9.0  MG 2.5*  --  2.4  --  2.4  --   --  2.1  --   --   --  2.5*  PHOS 2.8   < > 2.7 3.0  --  3.0 2.4* 2.5  --   --   --   --    < > = values in this interval not displayed.   Recent Labs  Lab 06/27/23 1537 06/28/23 0030 06/28/23 1535 06/29/23 0420 07/02/23 0444  AST  --  74*  --   --  33  ALT  --  36  --   --  27  ALKPHOS  --  55  --   --  47  BILITOT  --  0.9  --   --  1.3*  PROT  --  6.9  --   --  5.8*  ALBUMIN 2.3* 2.5* 1.8* 2.0* 1.7*     Recent Labs  Lab 06/30/23 0506 06/30/23 0551 07/01/23 0419 07/01/23 0535 07/01/23 1200 07/01/23 1848 07/02/23 0444  WBC 10.4 11.3* 10.9*  --   --  9.4 9.5  HGB 6.7* 7.1* 6.6* 6.4* 7.3* 7.4* 7.1*  HCT 22.0* 23.6* 21.4* 20.7* 22.8* 24.0* 23.3*  MCV 99.5 99.2 98.2  --   --  94.1 97.1  PLT 130* 145* 122*  --   --  114* 108*       Component Value Date/Time   CHOL 183 06/06/2023 0557   TRIG 109 06/19/2023 0031   HDL 60 06/06/2023 0557   CHOLHDL 3.1 06/06/2023 0557   VLDL 16 06/06/2023 0557  LDLCALC 107 (H) 06/06/2023 0557   LDLCALC 223 (H) 05/31/2021 0000   Lab Results  Component Value Date   HGBA1C 6.1 (H) 06/05/2023      Component Value Date/Time   LABOPIA NONE DETECTED 06/05/2023 1653   COCAINSCRNUR NONE DETECTED 06/05/2023 1653   LABBENZ NONE DETECTED 06/05/2023 1653   AMPHETMU NONE DETECTED 06/05/2023 1653   THCU NONE DETECTED 06/05/2023 1653   LABBARB NONE DETECTED 06/05/2023 1653    No results for input(s): "ETH" in the last 168 hours.   CT HEAD WO CONTRAST ( ) Result Date: 07/02/2023 CLINICAL DATA:  83 year old male status post code stroke presentation last month with left MCA branch occlusion, reperfusion, petechial hemorrhage. EXAM: CT HEAD WITHOUT CONTRAST TECHNIQUE: Contiguous axial images were obtained from the base of the skull through the vertex without intravenous contrast. RADIATION DOSE REDUCTION: This exam was performed according to the departmental dose-optimization program which includes automated exposure control, adjustment of the mA and/or kV according to patient size and/or use of iterative reconstruction technique. COMPARISON:  Head CTs 06/07/2023 and earlier. FINDINGS: Brain: Left MCA infarct involving the operculum and anterior division territory with heterogeneous combined developing encephalomalacia and petechial hemorrhage/laminar necrosis. Mass effect on the left lateral ventricle has regressed, no significant midline shift. Basilar  cisterns remain patent. No ventriculomegaly. Stable gray-white differentiation elsewhere, including small chronic cerebellar infarcts. Vascular: Calcified atherosclerosis at the skull base. No suspicious intracranial vascular hyperdensity. Skull: Intact, stable. Sinuses/Orbits: Progressive middle ear and mastoid opacification since last month. Left nasoenteric tube remains in place. Relatively stable paranasal sinus aeration. Other: No acute orbit or scalp soft tissue finding. IMPRESSION: 1. Left MCA territory developing encephalomalacia with superimposed petechial hemorrhage and/or laminar necrosis. 2. Regression of intracranial mass effect. No malignant hemorrhagic transformation or new intracranial abnormality. 3. Progressed middle ear and mastoid opacification since last month. Left nasoenteric tube remains in place. Electronically Signed   By: Odessa Fleming M.D.   On: 07/02/2023 09:57   DG CHEST PORT 1 VIEW Result Date: 06/29/2023 CLINICAL DATA:  Hypoxemia. EXAM: PORTABLE CHEST 1 VIEW COMPARISON:  06/27/2023 FINDINGS: ET tube tip is above the carina. There is a right IJ catheter with tip at the distal SVC. Feeding tube courses below the level of the field of view. Stable cardiomediastinal contours. Persistent bilateral pleural effusions with bibasilar atelectasis. Persistent pulmonary vascular congestion. Interstitial edema is decreased from previous exam. IMPRESSION: 1. Persistent bilateral pleural effusions with bibasilar atelectasis. 2. Decreased interstitial edema. Electronically Signed   By: Signa Kell M.D.   On: 06/29/2023 10:30   DG CHEST PORT 1 VIEW Result Date: 06/27/2023 CLINICAL DATA:  Tachypnea. EXAM: PORTABLE CHEST 1 VIEW COMPARISON:  June 24, 2023. FINDINGS: Stable cardiomediastinal silhouette is noted with interval development of central pulmonary vascular congestion and probable bilateral pulmonary edema. Bibasilar atelectasis is noted with probable pleural effusions, right greater than  left. Endotracheal and feeding tubes are in grossly good position. Bilateral internal jugular catheters are unchanged. IMPRESSION: Interval development of central pulmonary vascular congestion and probable bilateral pulmonary edema with associated bibasilar atelectasis and pleural effusions, right greater than left. Electronically Signed   By: Lupita Raider M.D.   On: 06/27/2023 11:36   DG CHEST PORT 1 VIEW Result Date: 06/25/2023 CLINICAL DATA:  ETT placement EXAM: PORTABLE CHEST 1 VIEW COMPARISON:  06/24/2023 FINDINGS: Endotracheal tube terminates 7 cm above the carina. Patchy bilateral opacities, favoring bibasilar atelectasis. Small left pleural effusion. No pneumothorax. The heart is normal in size. Right IJ venous  catheter terminates at the cavoatrial junction. Left IJ venous catheter terminates in the upper SVC. Enteric tube courses into the stomach. IMPRESSION: Endotracheal tube terminates 7 cm above the carina. Patchy bilateral opacities, favoring bibasilar atelectasis. Small left pleural effusion. Electronically Signed   By: Charline Bills M.D.   On: 06/25/2023 02:23   DG Chest Port 1 View Result Date: 06/24/2023 CLINICAL DATA:  Intubated EXAM: PORTABLE CHEST 1 VIEW COMPARISON:  06/24/2023 FINDINGS: Single frontal view of the chest image straits endotracheal tube overlying tracheal air column, tip at the level of the thoracic inlet approximately 8.5 cm above carina. Enteric catheter passes below diaphragm tip excluded by collimation. Bilateral internal jugular catheters are unchanged, distal margins projecting over the superior vena cava. Cardiac silhouette is stable. Stable bibasilar consolidation and bilateral pleural effusions. No pneumothorax. No acute bony abnormalities. IMPRESSION: 1. Endotracheal tube tip at the thoracic inlet, approximately 8.5 cm above carina. 2. Other support devices as above. 3. Persistent bibasilar consolidation and effusions. Electronically Signed   By: Sharlet Salina M.D.   On: 06/24/2023 22:43   DG CHEST PORT 1 VIEW Result Date: 06/23/2023 CLINICAL DATA:  Central line placement EXAM: PORTABLE CHEST 1 VIEW COMPARISON:  06/22/2023 FINDINGS: RIGHT central venous line with tip in distal SVC. LEFT central venous line with tip in the mid SVC. Endotracheal tube and feeding tube unchanged. No pneumothorax. Bilateral small effusions. Low lung volumes. IMPRESSION: 1. Bilateral central venous lines without complication. 2. Low lung volumes and small effusions. Electronically Signed   By: Genevive Bi M.D.   On: 06/23/2023 10:10   DG Chest Port 1 View Result Date: 06/22/2023 CLINICAL DATA:  Central line placement. EXAM: PORTABLE CHEST 1 VIEW COMPARISON:  June 19, 2023. FINDINGS: Stable cardiomediastinal silhouette. Hypoinflation of the lungs is noted with bibasilar subsegmental atelectasis and probable small pleural effusions. Endotracheal tube and feeding tube are unchanged. Left internal jugular catheter is unchanged. Interval placement of right internal jugular catheter with distal tip in expected position of the SVC. No pneumothorax. IMPRESSION: Interval placement of right internal jugular catheter with distal tip in expected position of the SVC. Electronically Signed   By: Lupita Raider M.D.   On: 06/22/2023 11:36   DG CHEST PORT 1 VIEW Result Date: 06/19/2023 CLINICAL DATA:  Respiratory distress. EXAM: PORTABLE CHEST 1 VIEW COMPARISON:  Radiograph 06/15/2023, CT 06/16/2023 FINDINGS: Tip of the endotracheal tube 4.5 cm from the carina. Enteric tube and left central line unchanged in positioning. Slight improved right lung aeration with persistent basilar opacity. Slight worsening patchy opacity at the left lung base. No pneumothorax or large pleural effusion. No pulmonary edema. Stable heart size and mediastinal contours. IMPRESSION: 1. Bibasilar opacities, improving on the right but worsening on the left. 2. Stable support apparatus. Electronically Signed    By: Narda Rutherford M.D.   On: 06/19/2023 16:02   DG Abd Portable 1V Result Date: 06/18/2023 CLINICAL DATA:  Orogastric tube placement. EXAM: PORTABLE ABDOMEN - 1 VIEW COMPARISON:  Radiographs 06/05/2023.  Abdominal CT 06/16/2023. FINDINGS: 1236 hours. Tip of the enteric tube projects over the right upper quadrant of the abdomen, likely in the distal stomach. There is a small amount contrast material within the stomach. The visualized bowel gas pattern is normal. Patient is rotated to the right with persistent right basilar airspace disease and a small right pleural effusion. IMPRESSION: Enteric tube tip projects over the distal stomach. Electronically Signed   By: Carey Bullocks M.D.   On: 06/18/2023 14:48  CT CHEST ABDOMEN PELVIS WO CONTRAST Result Date: 06/16/2023 CLINICAL DATA:  Septicemia.  Pneumonia. EXAM: CT CHEST, ABDOMEN AND PELVIS WITHOUT CONTRAST TECHNIQUE: Multidetector CT imaging of the chest, abdomen and pelvis was performed following the standard protocol without IV contrast. RADIATION DOSE REDUCTION: This exam was performed according to the departmental dose-optimization program which includes automated exposure control, adjustment of the mA and/or kV according to patient size and/or use of iterative reconstruction technique. COMPARISON:  CT angio chest from 04/04/2016. FINDINGS: CT CHEST FINDINGS Cardiovascular: Heart size is normal. Aortic atherosclerosis and multi vessel coronary artery calcifications. No pericardial effusion. Mediastinum/Nodes: Thyroid gland, trachea and esophagus appear normal. ET tube is in place with tip above the carina. There is a enteric tube with tip in the stomach.No enlarged mediastinal lymph nodes. Hilar lymph nodes are suboptimally evaluated due to lack of IV contrast. Lungs/Pleura: No pleural effusion or interstitial edema. Bilateral lower lobe airspace consolidation is identified, right greater than left. Imaging findings compatible with multifocal  pneumonia. Chronic subsegmental atelectasis this is within the right middle lobe and right lower lobe with asymmetric volume loss and elevation of the right hemidiaphragm. Musculoskeletal: No chest wall mass or suspicious bone lesions identified. CT ABDOMEN PELVIS FINDINGS Hepatobiliary: No focal liver abnormality. Gallbladder appears normal. No signs of bile duct dilatation. Pancreas: Unremarkable. No pancreatic ductal dilatation or surrounding inflammatory changes. Spleen: Normal in size without focal abnormality. Adrenals/Urinary Tract: Normal adrenal glands. Multiple stones identified within the upper pole of the left kidney which measure up to 7 mm. Bilateral exophytic kidney lesions of varying complexity are identified. These are suboptimally assessed on the current exam reflecting lack of IV contrast material. No signs of hydronephrosis. Urinary bladder is decompressed around a Foley catheter. Stomach/Bowel: Enteric tube tip is in the proximal stomach. There is retained enteric contrast material within the gastric fundus. The appendix is visualized and appears normal. There are several dilated loops of small bowel within the right hemiabdomen. The proximal small bowel loops are normal in caliber. Within the right upper quadrant of the abdomen there are multiple dilated loops of small bowel measuring up to 3.5 cm containing air-fluid levels. The bowel loops proximal and distal are normal in caliber. Within the limitations of unenhanced technique there is no significant bowel wall thickening. Distal colonic diverticulosis identified without signs of acute diverticulitis. Retained enteric contrast material identified within the colon up to the rectum Vascular/Lymphatic: Aortic atherosclerosis without aneurysm. No signs of abdominopelvic adenopathy. Reproductive: Prostate gland is either atrophic or surgically absent. Penile prosthesis identified. Other: No free fluid or fluid collections. Musculoskeletal: No  acute or significant osseous findings. IMPRESSION: 1. Bilateral lower lobe airspace consolidation, right greater than left. Imaging findings compatible with multifocal pneumonia. 2. Chronic subsegmental atelectasis within the right middle lobe and right lower lobe with asymmetric volume loss and elevation of the right hemidiaphragm. 3. Assessment of bowel pathology is limited due to lack of IV and enteric contrast material. There are multiple abnormally dilated loops of small bowel within the right hemiabdomen. Findings are compatible with a small-bowel obstruction. As there is normal caliber proximal small bowel and normal caliber distal small bowel findings are concerning for either close loop obstruction or internal hernia. 4. Distal colonic diverticulosis without signs of acute diverticulitis. 5. Nonobstructing left renal calculi. 6. Bilateral exophytic kidney lesions of varying complexity are identified. These are suboptimally assessed on the current exam reflecting lack of IV contrast material. Further evaluation with nonemergent renal ultrasound is advised. 7.  Aortic  Atherosclerosis (ICD10-I70.0). Electronically Signed   By: Signa Kell M.D.   On: 06/16/2023 15:51   ECHOCARDIOGRAM LIMITED Result Date: 06/15/2023    ECHOCARDIOGRAM LIMITED REPORT   Patient Name:   Bush Murdoch. Date of Exam: 06/15/2023 Medical Rec #:  [id number]              Height:       70.0 in Accession #:    [id number]             Weight:       263.9 lb Date of Birth:  [birth year]              BSA:          2.348 m Patient Age:    83 years               BP:           121/53 mmHg Patient Gender: M                      HR:           90 bpm. Exam Location:  Inpatient Procedure: Limited Echo, Color Doppler and Intracardiac Opacification Agent            (Both Spectral and Color Flow Doppler were utilized during            procedure). Indications:    Stroke i63.9, Shock  History:        Patient has prior history of Echocardiogram  examinations, most                 recent 06/05/2023. CHF; Risk Factors:Hypertension and                 Dyslipidemia.  Sonographer:    Irving Burton Senior RDCS Referring Phys: [phone] [facility] ELLISON  Sonographer Comments: Technically difficult due to body habitus, scanned supine on artificial repirator IMPRESSIONS  1. Very difficult to determine LVEF to limited visualization and frequent ectopy. . Left ventricular ejection fraction, by estimation, is 40 to 45%. The left ventricle has mildly decreased function. Left ventricular endocardial border not optimally defined to evaluate regional wall motion.  2. Right ventricular systolic function was not well visualized. The right ventricular size is not well visualized.  3. The inferior vena cava is normal in size with <50% respiratory variability, suggesting right atrial pressure of 8 mmHg.  4. Limited echo FINDINGS  Left Ventricle: Very difficult to determine LVEF to limited visualization and frequent ectopy. Left ventricular ejection fraction, by estimation, is 40 to 45%. The left ventricle has mildly decreased function. Left ventricular endocardial border not optimally defined to evaluate regional wall motion. Definity contrast agent was given IV to delineate the left ventricular endocardial borders. Right Ventricle: The right ventricular size is not well visualized. Right vetricular wall thickness was not well visualized. Right ventricular systolic function was not well visualized. Venous: The inferior vena cava is normal in size with less than 50% respiratory variability, suggesting right atrial pressure of 8 mmHg. Additional Comments: Color Doppler performed.  Dina Rich MD Electronically signed by Dina Rich MD Signature Date/Time: 06/15/2023/4:31:38 PM    Final    DG Abd 1 View Result Date: 06/15/2023 CLINICAL DATA:  Orogastric tube placement. EXAM: ABDOMEN - 1 VIEW COMPARISON:  Radiograph 06/08/2023 FINDINGS: Tip of the enteric tube below the diaphragm in  the stomach, the side port is just beyond the gastroesophageal junction. There is gaseous  gastric distension. Dilated bowel in the right abdomen up 4.7 cm, potentially small bowel, but incompletely included in the field of view. IMPRESSION: 1. Tip of the enteric tube below the diaphragm in the stomach, side-port just beyond the gastroesophageal junction. 2. Gaseous gastric distension. Additional dilated bowel in the right abdomen likely small bowel, although incompletely included in the field of view. Electronically Signed   By: Narda Rutherford M.D.   On: 06/15/2023 15:06   DG CHEST PORT 1 VIEW Result Date: 06/15/2023 CLINICAL DATA:  10031 Cough 10031 EXAM: PORTABLE CHEST 1 VIEW COMPARISON:  June 08, 2023 FINDINGS: The cardiomediastinal silhouette is unchanged in contour.Unchanged elevation of the RIGHT hemidiaphragm. No pleural effusion. No pneumothorax. Bibasilar platelike opacities. Atherosclerotic calcifications. Similar background of interstitial prominence. IMPRESSION: Bibasilar platelike opacities, favored to reflect atelectasis. Electronically Signed   By: Meda Klinefelter M.D.   On: 06/15/2023 12:07   DG CHEST PORT 1 VIEW Result Date: 06/15/2023 CLINICAL DATA:  Respiratory failure, intubation and central line placement. EXAM: PORTABLE CHEST 1 VIEW COMPARISON:  Film earlier today at 0734 hours FINDINGS: Interval intubation with the endotracheal tube tip approximately 3.5 cm above the carina. Left jugular central line placement with the line tip in the upper SVC. No pneumothorax. Volume loss of the right lung remains with elevation of the right hemidiaphragm. Improved expansion of the left lung. Potential underlying mild pulmonary interstitial edema. No significant pleural effusions. IMPRESSION: 1. Interval intubation and left jugular central line placement. No pneumothorax. 2. Improved expansion of the left lung. Persistent volume loss of the right lung with elevation of the right hemidiaphragm.  Potential underlying mild pulmonary interstitial edema. Electronically Signed   By: Irish Lack M.D.   On: 06/15/2023 10:42   DG Swallowing Func-Speech Pathology Result Date: 06/12/2023 Table formatting from the original result was not included. Modified Barium Swallow Study Patient Details Name: Kota Ciancio. MRN: [id number] Date of Birth: February 15, 1941 Today's Date: 06/12/2023 HPI/PMH: HPI: Pt is a 83 y.o. male who was admitted as a code stroke on 02/05 due to acute onset of right-sided weakness, right facial droop and aphasia. MRI from 02/06 displayed an evolving acute left MCA infarct, most pronounced at the left insula and left frontal operculum. Pt has intentionally lost 300 pounds in the recent past, with pre-loss weight at ~600Ib. PMHx includes hypertension, diabetes, CKD, prior CVA. Clinical Impression: Clinical Impression: Pt presents with a mild-moderate oropharyngeal dysphagia (DIGEST Score: 1) primarily characterized by silent penetration of thin liquid at level of VFs, flash penetration of nectar-thick above VFs, and diffuse pharyngeal residue. Pt's dysphagia severity level dropped from DIGEST score of 3 to 1, compared to previous MBS eval on 02/07.     Pharyngeal residue was present throughout all consistencies. Pt's posture for swallowing was consistently neutral. Pt follows commands for compensatory strategies more readily now with max multimodal cueing. Effective compensatory strategies include multiple swallows, throat clearing, and a straw when used with a verbally cued slow rate. Multiple consecutive sips of thin-liquid presented the most risk for aspiration due to silent penetration of Vfs. Unlike previous MBS study, pt tolerated solid consistency, displaying prolonged mastication but majority clearance of bolus after 2-3 swallows.     Recommendations include upgrading pt's diet to dysphagia 2 (finely chopped) and thin liquids to optimize safety and efficiency for swallowing. SLP f/u  to monitor tolerance of thin-liquids with compensatory strategies is highly recommended. Pt needs to remain upright for 30 minutes after PO intake due to remaining pharyngeal  residue to optimize airway safety. Factors that may increase risk of adverse event in presence of aspiration [name] & Clearance [name] 2021): Factors that may increase risk of adverse event in presence of aspiration [name] & Clearance [name] 2021): Dependence for feeding and/or oral hygiene Recommendations/Plan: Swallowing Evaluation Recommendations Swallowing Evaluation Recommendations Recommendations: PO diet PO Diet Recommendation: Dysphagia 2 (Finely chopped); Thin liquids (Level 0) Liquid Administration via: Straw Medication Administration: Crushed with puree Supervision: Full supervision/cueing for swallowing strategies; Full assist for feeding Swallowing strategies  : Slow rate; Small bites/sips; Multiple dry swallows after each bite/sip; Clear throat intermittently Postural changes: Position pt fully upright for meals; Stay upright 30-60 min after meals Oral care recommendations: Oral care BID (2x/day); Staff/trained caregiver to provide oral care Treatment Plan Treatment Plan Follow-up recommendations: Follow physicians's recommendations for discharge plan and follow up therapies Functional status assessment: Patient has had a recent decline in their functional status and demonstrates the ability to make significant improvements in function in a reasonable and predictable amount of time. Recommendations Recommendations for follow up therapy are one component of a multi-disciplinary discharge planning process, led by the attending physician.  Recommendations may be updated based on patient status, additional functional criteria and insurance authorization. Assessment: Orofacial Exam: Orofacial Exam Oral Cavity: Oral Hygiene: WFL Oral Cavity - Dentition: Adequate natural dentition Orofacial Anatomy: WFL Anatomy: Anatomy: WFL Boluses Administered:  Boluses Administered Boluses Administered: Thin liquids (Level 0); Mildly thick liquids (Level 2, nectar thick); Moderately thick liquids (Level 3, honey thick); Puree; Solid  Oral Impairment Domain: Oral Impairment Domain Lip Closure: Interlabial escape, no progression to anterior lip Tongue control during bolus hold: Cohesive bolus between tongue to palatal seal Bolus preparation/mastication: Slow prolonged chewing/mashing with complete recollection Bolus transport/lingual motion: Brisk tongue motion Oral residue: Residue collection on oral structures Location of oral residue : Tongue Initiation of pharyngeal swallow : Pyriform sinuses  Pharyngeal Impairment Domain: Pharyngeal Impairment Domain Soft palate elevation: No bolus between soft palate (SP)/pharyngeal wall (PW) Laryngeal elevation: Partial superior movement of thyroid cartilage/partial approximation of arytenoids to epiglottic petiole Anterior hyoid excursion: Complete anterior movement Epiglottic movement: Complete inversion Laryngeal vestibule closure: Incomplete, narrow column air/contrast in laryngeal vestibule Pharyngeal stripping wave : Present - complete Pharyngeal contraction (A/P view only): N/A Pharyngoesophageal segment opening: Complete distension and complete duration, no obstruction of flow Tongue base retraction: Trace column of contrast or air between tongue base and PPW Pharyngeal residue: Collection of residue within or on pharyngeal structures Location of pharyngeal residue: Diffuse (>3 areas)  Esophageal Impairment Domain: Esophageal Impairment Domain Esophageal clearance upright position: -- (Not tested) Pill: No data recorded Penetration/Aspiration Scale Score: Penetration/Aspiration Scale Score 1.  Material does not enter airway: Solid; Puree; Moderately thick liquids (Level 3, honey thick) 2.  Material enters airway, remains ABOVE vocal cords then ejected out: Mildly thick liquids (Level 2, nectar thick) 5.  Material enters  airway, CONTACTS cords and not ejected out: Thin liquids (Level 0) Compensatory Strategies: Compensatory Strategies Compensatory strategies: Yes Straw: Effective Effective Straw: Mildly thick liquid (Level 2, nectar thick) (Effective with slow rate applied.) Multiple swallows: Effective Effective Multiple Swallows: Thin liquid (Level 0); Mildly thick liquid (Level 2, nectar thick); Moderately thick liquid (Level 3, honey thick); Puree; Solid   General Information: Caregiver present: No  Diet Prior to this Study: Dysphagia 1 (pureed); Moderately thick liquids (Level 3, honey thick)   Temperature : Normal   Respiratory Status: WFL   Supplemental O2: None (Room air)   History of Recent Intubation: Yes  Behavior/Cognition: Alert; Cooperative; Pleasant mood; Requires cueing Self-Feeding Abilities: Needs assist with self-feeding Baseline vocal quality/speech: Normal Volitional Cough: Unable to elicit Volitional Swallow: Able to elicit Exam Limitations: No limitations Goal Planning: Prognosis for improved oropharyngeal function: Good Barriers to Reach Goals: Language deficits No data recorded No data recorded No data recorded Pain: Pain Assessment Pain Assessment: No/denies pain Pain Score: 0 End of Session: Start Time:SLP Start Time (ACUTE ONLY): 1310 Stop Time: SLP Stop Time (ACUTE ONLY): 1335 Time Calculation:SLP Time Calculation (min) (ACUTE ONLY): 25 min Charges: SLP Evaluations $ SLP Speech Visit: 1 Visit SLP Evaluations $MBS Swallow: 1 Procedure $Swallowing Treatment: 1 Procedure SLP visit diagnosis: SLP Visit Diagnosis: Dysphagia, oropharyngeal phase (R13.12) Past Medical History: Past Medical History: Diagnosis Date  Anemia of chronic disease   Ankle fracture 02/15/2016  Cancer (HCC)   Prostate  Chronic constipation   Chronic kidney disease   stage III  Diabetes mellitus without complication (HCC)   type II   Diabetic peripheral neuropathy (HCC)   Failure to thrive (0-17)   Fracture of left lower leg   Gout    Hyperlipidemia   Hypertension   Morbid obesity (HCC)   Unstable gait  Past Surgical History: Past Surgical History: Procedure Laterality Date  IR CT HEAD LTD  06/05/2023  IR INTRAVSC STENT CERV CAROTID W/O EMB-PROT MOD SED INC ANGIO  06/05/2023  IR PERCUTANEOUS ART THROMBECTOMY/INFUSION INTRACRANIAL INC DIAG ANGIO  06/05/2023  IR US GUIDE VASC ACCESS RIGHT  06/05/2023  ORIF ANKLE FRACTURE Left 02/29/2016  Procedure: OPEN REDUCTION INTERNAL FIXATION (ORIF) ANKLE FRACTURE;  Surgeon: Yolonda Kida, MD;  Location: [facility];  Service: Orthopedics;  Laterality: Left;  PROSTATE SURGERY    RADIOLOGY WITH ANESTHESIA N/A 06/05/2023  Procedure: IR WITH ANESTHESIA;  Surgeon: Radiologist, Medication, MD;  Location: [facility];  Service: Radiology;  Laterality: N/A; Claudine Mouton 06/12/2023, 3:16 PM  DG Abd 1 View Result Date: 06/09/2023 CLINICAL DATA:  [id number]. Encounter for feeding tube placement. EXAM: ABDOMEN - 1 VIEW COMPARISON:  Abdomen film 06/05/2023. FINDINGS: Dobbhoff feeding tube is well placed with the radiopaque tip in the distal stomach. The visualized bowel pattern is nonobstructive. There is barium newly seen in the flexures and transverse colon. There is no supine evidence of free air. There is atelectasis in the lung bases. Cardiomegaly. IMPRESSION: 1. Dobbhoff feeding tube well placed with the radiopaque tip in the distal stomach. 2. Nonobstructive bowel gas pattern. 3. Cardiomegaly. Electronically Signed   By: Almira Bar M.D.   On: 06/09/2023 03:41   DG CHEST PORT 1 VIEW Result Date: 06/08/2023 CLINICAL DATA:  CHF EXAM: PORTABLE CHEST 1 VIEW COMPARISON:  01/06/2023 FINDINGS: Feeding tube in place. There is cardiomegaly with vascular congestion. Chronic elevation of the right hemidiaphragm with right base atelectasis. Interstitial prominence throughout the lungs, right greater than left could reflect interstitial edema. Possible small effusions. No acute bony abnormality. IMPRESSION: Cardiomegaly with  vascular congestion and probable mild interstitial edema. Question small bilateral effusions. Chronic elevation of the right hemidiaphragm with right base atelectasis. Electronically Signed   By: Charlett Nose M.D.   On: 06/08/2023 18:20   DG Swallowing Func-Speech Pathology Result Date: 06/07/2023 Table formatting from the original result was not included. Modified Barium Swallow Study Study completed and documented by [name], SLP Student Supervised and reviewed by Harlon Ditty, MA CCC-SLP [facility] Secure Chat Preferred Office [phone] Patient Details Name: Darrly Loberg. MRN: [id number] Date of Birth: 01-12-1941 Today's Date: 06/07/2023 HPI/PMH:  HPI: Pt is a 83 y.o. male who was admitted as a code stroke on 02/05 due to acute onset of right-sided weakness, right facial droop and aphasia. MRI from 02/06 displayed an evolving acute left MCA infarct, most pronounced at the left insula and left frontal operculum. Pt has intentionally lost 300 pounds in the recent past, with pre-loss weight at ~600Ib. PMHx includes hypertension, diabetes, CKD, prior CVA. Clinical Impression: Clinical Impression: Pt presented with a moderate-severe oropharyngeal dysphagia (DIGEST Score: 3) primarily characterized by silent aspiration of thin liquid, silent penetration of nectar-thick liquid, diffuse pharyngeal residue collection during thin/nectar-thick consistencies, and posterior-escape of bolus across multiple consistencies. Pt positioning was a potential limitation for examination due to consistent posterior head position. More anterior head positioning during nectar-thick liquid displayed less pharyngeal residue, increased hyo-laryngeal excursion, and overall better airway safety. This positioning was not present throughout due to pt's receptive language deficits to understand cues. Alternating between honey-thick and puree consistencies provided more oropharyngeal bolus clearance and no  signs of aspiration. Recommend a dysphagia 1 (puree) and honey-thick liquid diet to optimize pt's airway safety and potential for meeting nutritional needs. Slow rate, small bites/sips, natural head posturing, and multiple swallows are compensatory strategies that displayed best efficiency with pt. F/u with SLP for family education, compensatory strategy training, and upgraded diet trials is recommended. Factors that may increase risk of adverse event in presence of aspiration [name] & Clearance [name] 2021): Factors that may increase risk of adverse event in presence of aspiration [name] & Clearance [name] 2021): Weak cough Recommendations/Plan: Swallowing Evaluation Recommendations Swallowing Evaluation Recommendations Recommendations: PO diet PO Diet Recommendation: Dysphagia 1 (Pureed); Moderately thick liquids (Level 3, honey thick) Liquid Administration via: Spoon Medication Administration: Crushed with puree Supervision: Full supervision/cueing for swallowing strategies; Full assist for feeding Swallowing strategies  : Slow rate; Small bites/sips; Multiple dry swallows after each bite/sip (Pt needs to be in a neutral head position (no posterior head tilt).) Postural changes: Position pt fully upright for meals; Stay upright 30-60 min after meals Oral care recommendations: Oral care BID (2x/day); Staff/trained caregiver to provide oral care Caregiver Recommendations: Have oral suction available Treatment Plan Treatment Plan Treatment recommendations: Therapy as outlined in treatment plan below Follow-up recommendations: Follow physicians's recommendations for discharge plan and follow up therapies Functional status assessment: Patient has had a recent decline in their functional status and demonstrates the ability to make significant improvements in function in a reasonable and predictable amount of time. Treatment frequency: Min 2x/week Treatment duration: 2 weeks Interventions: Aspiration precaution training; Compensatory  techniques; Patient/family education; Diet toleration management by SLP; Trials of upgraded texture/liquids Recommendations Recommendations for follow up therapy are one component of a multi-disciplinary discharge planning process, led by the attending physician.  Recommendations may be updated based on patient status, additional functional criteria and insurance authorization. Assessment: Orofacial Exam: Orofacial Exam Oral Cavity - Dentition: Adequate natural dentition Orofacial Anatomy: WFL Anatomy: Anatomy: WFL Boluses Administered: Boluses Administered Boluses Administered: Thin liquids (Level 0); Mildly thick liquids (Level 2, nectar thick); Moderately thick liquids (Level 3, honey thick); Puree; Solid  Oral Impairment Domain: Oral Impairment Domain Lip Closure: Escape progressing to mid-chin Tongue control during bolus hold: Posterior escape of greater than half of bolus Bolus preparation/mastication: -- (Pt did not chew solid and SLP had to remove it from mouth.) Bolus transport/lingual motion: Repetitive/disorganized tongue motion Oral residue: Residue collection on oral structures Location of oral residue : Tongue; Palate Initiation of pharyngeal swallow : Pyriform sinuses  Pharyngeal Impairment Domain:  Pharyngeal Impairment Domain Soft palate elevation: No bolus between soft palate (SP)/pharyngeal wall (PW) Laryngeal elevation: Partial superior movement of thyroid cartilage/partial approximation of arytenoids to epiglottic petiole Anterior hyoid excursion: Partial anterior movement Epiglottic movement: Partial inversion Laryngeal vestibule closure: Incomplete, narrow column air/contrast in laryngeal vestibule Pharyngeal stripping wave : Present - complete Pharyngeal contraction (A/P view only): N/A Pharyngoesophageal segment opening: Complete distension and complete duration, no obstruction of flow Tongue base retraction: Narrow column of contrast or air between tongue base and PPW Pharyngeal residue:  Collection of residue within or on pharyngeal structures Location of pharyngeal residue: Diffuse (>3 areas)  Esophageal Impairment Domain: Esophageal Impairment Domain Esophageal clearance upright position: -- (Not tested.) Pill: No data recorded Penetration/Aspiration Scale Score: Penetration/Aspiration Scale Score 1.  Material does not enter airway: Puree; Moderately thick liquids (Level 3, honey thick) 3.  Material enters airway, remains ABOVE vocal cords and not ejected out: Mildly thick liquids (Level 2, nectar thick) 8.  Material enters airway, passes BELOW cords without attempt by patient to eject out (silent aspiration) : Thin liquids (Level 0) Compensatory Strategies: Compensatory Strategies Compensatory strategies: Yes Straw: Ineffective Multiple swallows: Effective (Required max cueing due to pt's language.) Effective Multiple Swallows: Puree; Moderately thick liquid (Level 3, honey thick); Mildly thick liquid (Level 2, nectar thick)   General Information: Caregiver present: No  Diet Prior to this Study: NPO   Temperature : Normal   Respiratory Status: WFL   Supplemental O2: None (Room air)   No data recorded Behavior/Cognition: Alert; Cooperative; Pleasant mood; Requires cueing Self-Feeding Abilities: Needs assist with self-feeding Baseline vocal quality/speech: Dysphonic Volitional Cough: Unable to elicit Volitional Swallow: Able to elicit Exam Limitations: Poor positioning Goal Planning: Prognosis for improved oropharyngeal function: Good Barriers to Reach Goals: Language deficits No data recorded No data recorded Consulted and agree with results and recommendations: Pt unable/family or caregiver not available Pain: Pain Assessment Pain Assessment: No/denies pain Faces Pain Scale: 0 Facial Expression: 0 Body Movements: 0 Muscle Tension: 0 Compliance with ventilator (intubated pts.): N/A Vocalization (extubated pts.): 0 CPOT Total: 0 End of Session: Start Time:SLP Start Time (ACUTE ONLY): 0903 Stop  Time: SLP Stop Time (ACUTE ONLY): 0930 Time Calculation:SLP Time Calculation (min) (ACUTE ONLY): 27 min Charges: SLP Evaluations $ SLP Speech Visit: 1 Visit SLP Evaluations $BSS Swallow: 1 Procedure $MBS Swallow: 1 Procedure $ SLP EVAL LANGUAGE/SOUND PRODUCTION: 1 Procedure $Swallowing Treatment: 1 Procedure SLP visit diagnosis: SLP Visit Diagnosis: Dysphagia, oropharyngeal phase (R13.12) Past Medical History: Past Medical History: Diagnosis Date  Anemia of chronic disease   Ankle fracture 02/15/2016  Cancer (HCC)   Prostate  Chronic constipation   Chronic kidney disease   stage III  Diabetes mellitus without complication (HCC)   type II   Diabetic peripheral neuropathy (HCC)   Failure to thrive (0-17)   Fracture of left lower leg   Gout   Hyperlipidemia   Hypertension   Morbid obesity (HCC)   Unstable gait  Past Surgical History: Past Surgical History: Procedure Laterality Date  IR CT HEAD LTD  06/05/2023  IR INTRAVSC STENT CERV CAROTID W/O EMB-PROT MOD SED INC ANGIO  06/05/2023  IR PERCUTANEOUS ART THROMBECTOMY/INFUSION INTRACRANIAL INC DIAG ANGIO  06/05/2023  IR US GUIDE VASC ACCESS RIGHT  06/05/2023  ORIF ANKLE FRACTURE Left 02/29/2016  Procedure: OPEN REDUCTION INTERNAL FIXATION (ORIF) ANKLE FRACTURE;  Surgeon: Yolonda Kida, MD;  Location: [facility];  Service: Orthopedics;  Laterality: Left;  PROSTATE SURGERY    RADIOLOGY WITH ANESTHESIA N/A 06/05/2023  Procedure: IR WITH ANESTHESIA;  Surgeon: Radiologist, Medication, MD;  Location: [facility];  Service: Radiology;  Laterality: N/A; [place], Riley Nearing 06/07/2023, 12:15 PM  CT HEAD WO CONTRAST ( ) Result Date: 06/07/2023 CLINICAL DATA:  83 year old male status post code stroke presentation, left MCA M2 occlusion, with left MCA infarct with confluent petechial hemorrhage. EXAM: CT HEAD WITHOUT CONTRAST TECHNIQUE: Contiguous axial images were obtained from the base of the skull through the vertex without intravenous contrast. RADIATION DOSE REDUCTION: This exam was  performed according to the departmental dose-optimization program which includes automated exposure control, adjustment of the mA and/or kV according to patient size and/or use of iterative reconstruction technique. COMPARISON:  Brain MRI yesterday.  Head CT 06/05/2023. FINDINGS: Brain: Confluent mixed density infarct in the left MCA middle division, epicenter at the insula and operculum. Confluent petechial hemorrhage on series 3, image 16 appears stable from the MRI. Superimposed trace subarachnoid blood is difficult to exclude by CT (series 3, image 13), but was not apparent on MRI. Stable mild mass effect on the left lateral ventricle with only trace rightward midline shift (series 3, image 16). No ventriculomegaly. Elsewhere gray-white differentiation is stable from the presentation CT. Basilar cisterns remain patent. Vascular: Resolved hyperdense left MCA since presentation. Calcified atherosclerosis at the skull base. Skull: Stable, intact. Sinuses/Orbits: Visualized paranasal sinuses and mastoids are stable and well aerated. Other: Left nasoenteric tube now in place. Stable orbit and scalp soft tissues. IMPRESSION: 1. Stable by CT confluent Left MCA middle division infarct with petechial hemorrhage (Heidelberg classification 1b: HI2, confluent petechiae, no mass effect). Questionable trace superimposed SAH, but was not apparent on MRI. 2. Stable minimal intracranial mass effect. 3. No new intracranial abnormality. Electronically Signed   By: Odessa Fleming M.D.   On: 06/07/2023 05:37   MR BRAIN WO CONTRAST Result Date: 06/06/2023 CLINICAL DATA:  Follow-up examination for stroke. EXAM: MRI HEAD WITHOUT CONTRAST TECHNIQUE: Multiplanar, multiecho pulse sequences of the brain and surrounding structures were obtained without intravenous contrast. COMPARISON:  Comparison made to multiple previous exams from 06/05/2023. FINDINGS: Brain: Examination degraded by motion artifact. Cerebral volume within normal limits for  age. Patchy T2/FLAIR hyperintensity involving the periventricular deep white matter both cerebral hemispheres, consistent with chronic small vessel ischemic disease, mild in nature. Small remote infarct noted within the right cerebellum. Confluent restricted diffusion involving the left insula and left frontal operculum, consistent with evolving acute left MCA distribution infarct. Area of infarction measures up to approximately 6 cm in AP diameter. Prominent associated susceptibility artifact, consistent with petechial hemorrhage (series 7, image 61) ( Heidelberg classification 1b: HI2, confluent petechiae, no mass effect. No frank or organized hematoma evident by MRI. No significant regional mass effect. Few additional small foci of infarction noted within the left parieto-occipital region posteriorly (series 3, images 40, 29). Apparent diffusion signal along the right frontal parafalcine region felt to be consistent with artifact related to dural calcification. Note made of a few additional chronic micro hemorrhages within the left cerebellum and right thalamus. No mass lesion or midline shift. Ventricles normal size without hydrocephalus. No extra-axial fluid collection. Pituitary gland suprasellar region within normal limits. Vascular: Major intracranial vascular flow voids are maintained. Skull and upper cervical spine: Craniocervical junction within normal limits. Bone marrow signal intensity overall within normal limits. No scalp soft tissue abnormality. Sinuses/Orbits: Prior bilateral ocular lens replacement. Paranasal sinuses are clear. No mastoid effusion. Other: None. IMPRESSION: 1. Evolving acute left MCA distribution infarct, most pronounced at the left insula and  left frontal operculum. Prominent associated petechial hemorrhage without frank intraparenchymal hematoma (Heidelberg classification 1b: HI2, confluent petechiae, no mass effect). 2. Few additional small foci of acute infarction within the  left parieto-occipital region posteriorly. 3. Underlying mild chronic microvascular ischemic disease with small remote right cerebellar infarct. Electronically Signed   By: Rise Mu M.D.   On: 06/06/2023 03:12   ECHOCARDIOGRAM COMPLETE Result Date: 06/05/2023    ECHOCARDIOGRAM REPORT   Patient Name:   Jarrin Staley. Date of Exam: 06/05/2023 Medical Rec #:  [id number]              Height:       70.0 in Accession #:    [id number]             Weight:       292.3 lb Date of Birth:  02/16/1941              BSA:          2.453 m Patient Age:    82 years               BP:           129/72 mmHg Patient Gender: M                      HR:           89 bpm. Exam Location:  Inpatient Procedure: 2D Echo, Color Doppler, Cardiac Doppler and Intracardiac            Opacification Agent Indications:    Stroke I63.9  History:        Patient has prior history of Echocardiogram examinations, most                 recent 10/06/2019. Risk Factors:Diabetes, Hypertension and                 Dyslipidemia.  Sonographer:    Harriette Bouillon RDCS Referring Phys: Lynnae January IMPRESSIONS  1. Left ventricular ejection fraction, by estimation, is 30%. The left ventricle has moderately decreased function. The left ventricle demonstrates global hypokinesis. There is mild left ventricular hypertrophy.  2. Right ventricular systolic function is normal. The right ventricular size is normal.  3. Trivial mitral valve regurgitation.  4. The aortic valve is tricuspid. Aortic valve regurgitation is not visualized.  5. The inferior vena cava is normal in size with greater than 50% respiratory variability, suggesting right atrial pressure of 3 mmHg. Comparison(s): The left ventricular function is worsened. FINDINGS  Left Ventricle: Left ventricular ejection fraction, by estimation, is 30%. The left ventricle has moderately decreased function. The left ventricle demonstrates global hypokinesis. Definity contrast agent was given IV to delineate  the left ventricular endocardial borders. The left ventricular internal cavity size was normal in size. There is mild left ventricular hypertrophy. Right Ventricle: The right ventricular size is normal. Right vetricular wall thickness was not assessed. Right ventricular systolic function is normal. Left Atrium: Left atrial size was normal in size. Right Atrium: Right atrial size was normal in size. Pericardium: There is no evidence of pericardial effusion. Mitral Valve: There is mild thickening of the mitral valve leaflet(s). Mild to moderate mitral annular calcification. Trivial mitral valve regurgitation. Tricuspid Valve: The tricuspid valve is normal in structure. Tricuspid valve regurgitation is trivial. Aortic Valve: The aortic valve is tricuspid. Aortic valve regurgitation is not visualized. Pulmonic Valve: The pulmonic valve was normal in structure. Pulmonic valve regurgitation is  not visualized. Aorta: The aortic root and ascending aorta are structurally normal, with no evidence of dilitation. Venous: The inferior vena cava is normal in size with greater than 50% respiratory variability, suggesting right atrial pressure of 3 mmHg. IAS/Shunts: The interatrial septum was not assessed.  LEFT VENTRICLE PLAX 2D LVIDd:         5.00 cm   Diastology LVIDs:         4.40 cm   LV e' lateral: 5.33 cm/s LV PW:         1.20 cm LV IVS:        1.10 cm LVOT diam:     2.30 cm LV SV:         55 LV SV Index:   23 LVOT Area:     4.15 cm  RIGHT VENTRICLE RV S prime:     14.40 cm/s LEFT ATRIUM         Index LA diam:    4.60 cm 1.88 cm/m  AORTIC VALVE LVOT Vmax:   65.50 cm/s LVOT Vmean:  45.200 cm/s LVOT VTI:    0.133 m  AORTA Ao Root diam: 3.00 cm Ao Asc diam:  3.50 cm  SHUNTS Systemic VTI:  0.13 m Systemic Diam: 2.30 cm Dietrich Pates MD Electronically signed by Dietrich Pates MD Signature Date/Time: 06/05/2023/9:33:38 PM    Final    DG Abd Portable 1V Result Date: 06/05/2023 CLINICAL DATA:  Feeding tube placement. EXAM: PORTABLE  ABDOMEN - 1 VIEW COMPARISON:  None Available. FINDINGS: Distal tip of feeding tube is seen in expected position of distal stomach. IMPRESSION: Distal tip of feeding tube seen in expected position of distal stomach. Electronically Signed   By: Lupita Raider M.D.   On: 06/05/2023 15:49   CT HEAD WO CONTRAST Result Date: 06/05/2023 CLINICAL DATA:  Stroke, follow-up. Status post intracranial mechanical thrombectomy and stenting of the left ICA bifurcation. EXAM: CT HEAD WITHOUT CONTRAST TECHNIQUE: Contiguous axial images were obtained from the base of the skull through the vertex without intravenous contrast. RADIATION DOSE REDUCTION: This exam was performed according to the departmental dose-optimization program which includes automated exposure control, adjustment of the mA and/or kV according to patient size and/or use of iterative reconstruction technique. COMPARISON:  CT head without contrast and CT angio head and neck 06/05/2023. By plain CT 06/05/2023. FINDINGS: Brain: The study is mildly degraded by patient motion. The left insular and left opercular infarct is somewhat obscured by patient motion. The cortex is slightly hyperdense, potentially reflecting reperfusion. No hemorrhage is present. Basal ganglia are intact. Subcortical white matter hypoattenuation in the high left frontal lobe is new. Mild right-sided white matter disease is stable. Basal ganglia are intact. A remote lacunar infarct is again noted in the right cerebellum. The brainstem and cerebellum are otherwise within normal limits. Vascular: Atherosclerotic calcifications are present within the cavernous internal carotid arteries and at the normal origin of both vertebral arteries. No hyperdense vessel is present. Skull: Calvarium is intact. No focal lytic or blastic lesions are present. No significant extracranial soft tissue lesion is present. Sinuses/Orbits: The paranasal sinuses and mastoid air cells are clear. Bilateral lens replacements  are noted. Globes and orbits are otherwise unremarkable. IMPRESSION: 1. The left insular and left opercular infarct is somewhat obscured by patient motion. 2. The cortex is slightly hyperdense, potentially reflecting reperfusion. 3. No hemorrhage. 4. Subcortical white matter hypoattenuation in the high left frontal lobe is new. This may reflect ischemic changes or edema related to  the reperfusion. 5. Remote lacunar infarct of the right cerebellum. 6. Stable mild right-sided white matter disease. This likely reflects the sequela of chronic microvascular ischemia. Electronically Signed   By: Marin Roberts M.D.   On: 06/05/2023 14:38   IR PERCUTANEOUS ART THROMBECTOMY/INFUSION INTRACRANIAL INC DIAG ANGIO Result Date: 06/05/2023 INDICATION: 83 year old male presenting with right-sided weakness and aphasia; NIHSS 28. His last known well was 10 p.m. on 06/04/2023. His past medical history significant for prior stroke, hypertension, diabetes and chronic kidney disease; baseline modified Rankin scale 0. Head CT showed hypodensity within the left insula, basal ganglia and left frontal operculum (ASPECTS 7). No IV thrombolytic given as patient was outside the window. CT angiogram of the head and neck showed an occlusion of a left M2/MCA anterior division branch. CT perfusion showed a 41 mL core infarct with a 45 mL ischemic penumbra. She was transferred to our service for mechanical thrombectomy. EXAM: ULTRASOUND-GUIDED VASCULAR ACCESS DIAGNOSTIC CEREBRAL ANGIOGRAM MECHANICAL THROMBECTOMY FLAT PANEL HEAD CT LEFT CAROTID STENTING AND ANGIOPLASTY WITHOUT CEREBRAL PROTECTION DEVICE COMPARISON:  CT/CT angiogram of the head and neck June 05, 2023. MEDICATIONS: No antibiotics administered. ANESTHESIA/SEDATION: The procedure was performed under general anesthesia. CONTRAST:  80 mL of Omnipaque 300 milligram/mL FLUOROSCOPY: Radiation Exposure Index (as provided by the fluoroscopic device): 1315 mGy Kerma COMPLICATIONS:  None immediate. TECHNIQUE: Informed written consent was obtained from the patient's wife after a thorough discussion of the procedural risks, benefits and alternatives. All questions were addressed. Maximal Sterile Barrier Technique was utilized including caps, mask, sterile gowns, sterile gloves, sterile drape, hand hygiene and skin antiseptic. A timeout was performed prior to the initiation of the procedure. The right groin was prepped and draped in the usual sterile fashion. Using a micropuncture kit and the modified Seldinger technique, access was gained to the right common femoral artery and an 8 French sheath was placed. Real-time ultrasound guidance was utilized for vascular access including the acquisition of a permanent ultrasound image documenting patency of the accessed vessel. Under fluoroscopy, an 8 [place] Walrus balloon guide catheter was navigated over a 6 [place] VTK catheter and a 0.035" Terumo Glidewire into the aortic arch. The catheter was placed into the left common carotid artery and then advanced into the left internal carotid artery. The diagnostic catheter was removed. Frontal and lateral angiograms of the head were obtained. FINDINGS: 1. Ultrasound showed heavily calcified right common femoral artery is maintained patency and caliber. 2. Proximal occlusion of a left M2/MCA anterior division branch. 3. Atherosclerotic changes of the intracranial left ICA with mild stenosis at the distal cavernous segment. 4. 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 PROCEDURE: Using biplane roadmap guidance, a Red 62 aspiration catheter was navigated over Colossus 35 microguidewire into the cavernous segment of the left ICA. The aspiration catheter was then advanced to the level of occlusion and connected to an aspiration pump. Continuous aspiration was performed for 2 minutes. The guide catheter was  connected to a VacLok syringe and the guiding catheter balloon was inflated. The aspiration catheter was subsequently removed under constant aspiration. The guide catheter was aspirated for debris. Left internal carotid artery angiograms with frontal and lateral views of the head showed complete recanalization of the left MCA vascular tree. The guide catheter was retracted into the neck. Frontal and lateral angiograms of the neck were obtained. Improvement of the degree of stenosis in the carotid bulb compared to  prior CT angiogram. There is prominent luminal irregularity at the carotid bulb residual moderate stenosis. Increased tortuosity of the proximal/mid cervical left ICA with kinking. Left internal carotid artery angiograms with left anterior oblique views of the neck showed evidence of filling defects at the level of the carotid bulb. Flat panel CT of the head was obtained and post processed in a separate workstation with concurrent attending physician supervision. Selected images were sent to PACS. No evidence of hemorrhagic complication. There is mild contrast staining of the left insular and frontal cortex. Repeat left internal carotid artery angiograms with frontal and lateral views of the head showed Amy seen left M3/MCA branch to the left parietal region. Left common carotid artery angiograms with frontal and lateral views of the neck showed vertebra Gretchen of stenosis at the left ICA bulb with more prominent filling defect. At this point, patient was loaded on cangrelor followed by continuous drip. Using biplane roadmap guidance, a 4-7 mm Emboshield NAV6 cerebral protection device was advanced into the cervical left ICA. However, multiple attempts to advance a cerebral protection device through the left ICA kinking proved unsuccessful. The cerebral protection device was subsequently removed. Using biplane roadmap guidance, a 10-8 x 40 mm XACT carotid stent was navigated and deployed from the distal  left common carotid artery to the proximal left internal carotid artery, proximal to the vessel kinking. Suboptimal stent expansion was noted. Then, a 6 x 30 mm Viatrac balloon was navigated into the recently deployed stent. Angioplasty was performed under fluoroscopy. Left internal carotid artery angiograms with frontal and lateral views of the neck showed adequate stent positioning and expansion with brisk anterograde flow. Left internal carotid artery angiograms with frontal and lateral views of the head showed improvement of anterograde flow in the left MCA vascular tree with brisk anterograde flow. Delayed left common carotid artery angiograms with frontal and lateral views of the neck showed no evidence of clot formation within the stent. The catheter was subsequently Riddle. Right common femoral artery angiogram was obtained in right anterior oblique view. The puncture is at the level of the common femoral artery. The artery has normal caliber, adequate for closure device. The sheath was exchanged over the wire for an 8 [place] Angio-Seal which was utilized for access closure. Immediate hemostasis was achieved. IMPRESSION: 1. Successful mechanical thrombectomy for treatment of a proximal left M2/MCA anterior division branch occlusion achieving complete recanalization (TICI 3). 2. 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 PLAN: Continue cangrelor infusion until patient is transitioned to oral dual antiplatelet therapy. Electronically Signed   By: Baldemar Lenis M.D.   On: 06/05/2023 13:16   IR US Guide Vasc Access Right Result Date: 06/05/2023 INDICATION: 83 year old male presenting with right-sided weakness and aphasia; NIHSS 28. His last known well was 10 p.m. on 06/04/2023. His past medical history significant for prior stroke, hypertension,  diabetes and chronic kidney disease; baseline modified Rankin scale 0. Head CT showed hypodensity within the left insula, basal ganglia and left frontal operculum (ASPECTS 7). No IV thrombolytic given as patient was outside the window. CT angiogram of the head and neck showed an occlusion of a left M2/MCA anterior division branch. CT perfusion showed a 41 mL core infarct with a 45 mL ischemic penumbra. She was transferred to our service for mechanical thrombectomy. EXAM: ULTRASOUND-GUIDED VASCULAR ACCESS DIAGNOSTIC CEREBRAL ANGIOGRAM MECHANICAL THROMBECTOMY FLAT PANEL HEAD CT  LEFT CAROTID STENTING AND ANGIOPLASTY WITHOUT CEREBRAL PROTECTION DEVICE COMPARISON:  CT/CT angiogram of the head and neck June 05, 2023. MEDICATIONS: No antibiotics administered. ANESTHESIA/SEDATION: The procedure was performed under general anesthesia. CONTRAST:  80 mL of Omnipaque 300 milligram/mL FLUOROSCOPY: Radiation Exposure Index (as provided by the fluoroscopic device): 1315 mGy Kerma COMPLICATIONS: None immediate. TECHNIQUE: Informed written consent was obtained from the patient's wife after a thorough discussion of the procedural risks, benefits and alternatives. All questions were addressed. Maximal Sterile Barrier Technique was utilized including caps, mask, sterile gowns, sterile gloves, sterile drape, hand hygiene and skin antiseptic. A timeout was performed prior to the initiation of the procedure. The right groin was prepped and draped in the usual sterile fashion. Using a micropuncture kit and the modified Seldinger technique, access was gained to the right common femoral artery and an 8 French sheath was placed. Real-time ultrasound guidance was utilized for vascular access including the acquisition of a permanent ultrasound image documenting patency of the accessed vessel. Under fluoroscopy, an 8 [place] Walrus balloon guide catheter was navigated over a 6 [place] VTK catheter and a 0.035" Terumo Glidewire into the aortic  arch. The catheter was placed into the left common carotid artery and then advanced into the left internal carotid artery. The diagnostic catheter was removed. Frontal and lateral angiograms of the head were obtained. FINDINGS: 1. Ultrasound showed heavily calcified right common femoral artery is maintained patency and caliber. 2. Proximal occlusion of a left M2/MCA anterior division branch. 3. Atherosclerotic changes of the intracranial left ICA with mild stenosis at the distal cavernous segment. 4. 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 PROCEDURE: Using biplane roadmap guidance, a Red 62 aspiration catheter was navigated over Colossus 35 microguidewire into the cavernous segment of the left ICA. The aspiration catheter was then advanced to the level of occlusion and connected to an aspiration pump. Continuous aspiration was performed for 2 minutes. The guide catheter was connected to a VacLok syringe and the guiding catheter balloon was inflated. The aspiration catheter was subsequently removed under constant aspiration. The guide catheter was aspirated for debris. Left internal carotid artery angiograms with frontal and lateral views of the head showed complete recanalization of the left MCA vascular tree. The guide catheter was retracted into the neck. Frontal and lateral angiograms of the neck were obtained. Improvement of the degree of stenosis in the carotid bulb compared to prior CT angiogram. There is prominent luminal irregularity at the carotid bulb residual moderate stenosis. Increased tortuosity of the proximal/mid cervical left ICA with kinking. Left internal carotid artery angiograms with left anterior oblique views of the neck showed evidence of filling defects at the level of the carotid bulb. Flat panel CT of the head was obtained and post processed in a separate workstation with concurrent  attending physician supervision. Selected images were sent to PACS. No evidence of hemorrhagic complication. There is mild contrast staining of the left insular and frontal cortex. Repeat left internal carotid artery angiograms with frontal and lateral views of the head showed Amy seen left M3/MCA branch to the left parietal region. Left common carotid artery angiograms with frontal and lateral views of the neck showed vertebra Gretchen of stenosis at the left ICA bulb with more prominent filling defect. At this point, patient was loaded on cangrelor followed by continuous drip. Using biplane roadmap guidance, a 4-7 mm Emboshield NAV6 cerebral protection device was  advanced into the cervical left ICA. However, multiple attempts to advance a cerebral protection device through the left ICA kinking proved unsuccessful. The cerebral protection device was subsequently removed. Using biplane roadmap guidance, a 10-8 x 40 mm XACT carotid stent was navigated and deployed from the distal left common carotid artery to the proximal left internal carotid artery, proximal to the vessel kinking. Suboptimal stent expansion was noted. Then, a 6 x 30 mm Viatrac balloon was navigated into the recently deployed stent. Angioplasty was performed under fluoroscopy. Left internal carotid artery angiograms with frontal and lateral views of the neck showed adequate stent positioning and expansion with brisk anterograde flow. Left internal carotid artery angiograms with frontal and lateral views of the head showed improvement of anterograde flow in the left MCA vascular tree with brisk anterograde flow. Delayed left common carotid artery angiograms with frontal and lateral views of the neck showed no evidence of clot formation within the stent. The catheter was subsequently Riddle. Right common femoral artery angiogram was obtained in right anterior oblique view. The puncture is at the level of the common femoral artery. The artery has  normal caliber, adequate for closure device. The sheath was exchanged over the wire for an 8 [place] Angio-Seal which was utilized for access closure. Immediate hemostasis was achieved. IMPRESSION: 1. Successful mechanical thrombectomy for treatment of a proximal left M2/MCA anterior division branch occlusion achieving complete recanalization (TICI 3). 2. 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 PLAN: Continue cangrelor infusion until patient is transitioned to oral dual antiplatelet therapy. Electronically Signed   By: Baldemar Lenis M.D.   On: 06/05/2023 13:16   IR INTRAVSC STENT CERV CAROTID W/O EMB-PROT MOD SED Result Date: 06/05/2023 INDICATION: 83 year old male presenting with right-sided weakness and aphasia; NIHSS 28. His last known well was 10 p.m. on 06/04/2023. His past medical history significant for prior stroke, hypertension, diabetes and chronic kidney disease; baseline modified Rankin scale 0. Head CT showed hypodensity within the left insula, basal ganglia and left frontal operculum (ASPECTS 7). No IV thrombolytic given as patient was outside the window. CT angiogram of the head and neck showed an occlusion of a left M2/MCA anterior division branch. CT perfusion showed a 41 mL core infarct with a 45 mL ischemic penumbra. She was transferred to our service for mechanical thrombectomy. EXAM: ULTRASOUND-GUIDED VASCULAR ACCESS DIAGNOSTIC CEREBRAL ANGIOGRAM MECHANICAL THROMBECTOMY FLAT PANEL HEAD CT LEFT CAROTID STENTING AND ANGIOPLASTY WITHOUT CEREBRAL PROTECTION DEVICE COMPARISON:  CT/CT angiogram of the head and neck June 05, 2023. MEDICATIONS: No antibiotics administered. ANESTHESIA/SEDATION: The procedure was performed under general anesthesia. CONTRAST:  80 mL of Omnipaque 300 milligram/mL FLUOROSCOPY: Radiation Exposure Index (as  provided by the fluoroscopic device): 1315 mGy Kerma COMPLICATIONS: None immediate. TECHNIQUE: Informed written consent was obtained from the patient's wife after a thorough discussion of the procedural risks, benefits and alternatives. All questions were addressed. Maximal Sterile Barrier Technique was utilized including caps, mask, sterile gowns, sterile gloves, sterile drape, hand hygiene and skin antiseptic. A timeout was performed prior to the initiation of the procedure. The right groin was prepped and draped in the usual sterile fashion. Using a micropuncture kit and the modified Seldinger technique, access was gained to the right common femoral artery and an 8 French sheath was placed. Real-time ultrasound guidance was utilized for vascular access including the acquisition of a permanent ultrasound image documenting patency  of the accessed vessel. Under fluoroscopy, an 8 [place] Walrus balloon guide catheter was navigated over a 6 [place] VTK catheter and a 0.035" Terumo Glidewire into the aortic arch. The catheter was placed into the left common carotid artery and then advanced into the left internal carotid artery. The diagnostic catheter was removed. Frontal and lateral angiograms of the head were obtained. FINDINGS: 1. Ultrasound showed heavily calcified right common femoral artery is maintained patency and caliber. 2. Proximal occlusion of a left M2/MCA anterior division branch. 3. Atherosclerotic changes of the intracranial left ICA with mild stenosis at the distal cavernous segment. 4. 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 PROCEDURE: Using biplane roadmap guidance, a Red 62 aspiration catheter was navigated over Colossus 35 microguidewire into the cavernous segment of the left ICA. The aspiration catheter was then advanced to the level of occlusion and connected to an aspiration pump.  Continuous aspiration was performed for 2 minutes. The guide catheter was connected to a VacLok syringe and the guiding catheter balloon was inflated. The aspiration catheter was subsequently removed under constant aspiration. The guide catheter was aspirated for debris. Left internal carotid artery angiograms with frontal and lateral views of the head showed complete recanalization of the left MCA vascular tree. The guide catheter was retracted into the neck. Frontal and lateral angiograms of the neck were obtained. Improvement of the degree of stenosis in the carotid bulb compared to prior CT angiogram. There is prominent luminal irregularity at the carotid bulb residual moderate stenosis. Increased tortuosity of the proximal/mid cervical left ICA with kinking. Left internal carotid artery angiograms with left anterior oblique views of the neck showed evidence of filling defects at the level of the carotid bulb. Flat panel CT of the head was obtained and post processed in a separate workstation with concurrent attending physician supervision. Selected images were sent to PACS. No evidence of hemorrhagic complication. There is mild contrast staining of the left insular and frontal cortex. Repeat left internal carotid artery angiograms with frontal and lateral views of the head showed Amy seen left M3/MCA branch to the left parietal region. Left common carotid artery angiograms with frontal and lateral views of the neck showed vertebra Gretchen of stenosis at the left ICA bulb with more prominent filling defect. At this point, patient was loaded on cangrelor followed by continuous drip. Using biplane roadmap guidance, a 4-7 mm Emboshield NAV6 cerebral protection device was advanced into the cervical left ICA. However, multiple attempts to advance a cerebral protection device through the left ICA kinking proved unsuccessful. The cerebral protection device was subsequently removed. Using biplane roadmap guidance, a  10-8 x 40 mm XACT carotid stent was navigated and deployed from the distal left common carotid artery to the proximal left internal carotid artery, proximal to the vessel kinking. Suboptimal stent expansion was noted. Then, a 6 x 30 mm Viatrac balloon was navigated into the recently deployed stent. Angioplasty was performed under fluoroscopy. Left internal carotid artery angiograms with frontal and lateral views of the neck showed adequate stent positioning and expansion with brisk anterograde flow. Left internal carotid artery angiograms with frontal and lateral views of the head showed improvement of anterograde flow in the left MCA vascular tree with brisk anterograde flow. Delayed left common carotid artery angiograms with frontal and lateral views of the neck showed no evidence of clot formation within the stent. The catheter was subsequently Riddle.  Right common femoral artery angiogram was obtained in right anterior oblique view. The puncture is at the level of the common femoral artery. The artery has normal caliber, adequate for closure device. The sheath was exchanged over the wire for an 8 [place] Angio-Seal which was utilized for access closure. Immediate hemostasis was achieved. IMPRESSION: 1. Successful mechanical thrombectomy for treatment of a proximal left M2/MCA anterior division branch occlusion achieving complete recanalization (TICI 3). 2. 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 PLAN: Continue cangrelor infusion until patient is transitioned to oral dual antiplatelet therapy. Electronically Signed   By: Baldemar Lenis M.D.   On: 06/05/2023 13:16   IR CT Head Ltd Result Date: 06/05/2023 INDICATION: 83 year old male presenting with right-sided weakness and aphasia; NIHSS 28. His last known well was 10 p.m. on 06/04/2023. His past  medical history significant for prior stroke, hypertension, diabetes and chronic kidney disease; baseline modified Rankin scale 0. Head CT showed hypodensity within the left insula, basal ganglia and left frontal operculum (ASPECTS 7). No IV thrombolytic given as patient was outside the window. CT angiogram of the head and neck showed an occlusion of a left M2/MCA anterior division branch. CT perfusion showed a 41 mL core infarct with a 45 mL ischemic penumbra. She was transferred to our service for mechanical thrombectomy. EXAM: ULTRASOUND-GUIDED VASCULAR ACCESS DIAGNOSTIC CEREBRAL ANGIOGRAM MECHANICAL THROMBECTOMY FLAT PANEL HEAD CT LEFT CAROTID STENTING AND ANGIOPLASTY WITHOUT CEREBRAL PROTECTION DEVICE COMPARISON:  CT/CT angiogram of the head and neck June 05, 2023. MEDICATIONS: No antibiotics administered. ANESTHESIA/SEDATION: The procedure was performed under general anesthesia. CONTRAST:  80 mL of Omnipaque 300 milligram/mL FLUOROSCOPY: Radiation Exposure Index (as provided by the fluoroscopic device): 1315 mGy Kerma COMPLICATIONS: None immediate. TECHNIQUE: Informed written consent was obtained from the patient's wife after a thorough discussion of the procedural risks, benefits and alternatives. All questions were addressed. Maximal Sterile Barrier Technique was utilized including caps, mask, sterile gowns, sterile gloves, sterile drape, hand hygiene and skin antiseptic. A timeout was performed prior to the initiation of the procedure. The right groin was prepped and draped in the usual sterile fashion. Using a micropuncture kit and the modified Seldinger technique, access was gained to the right common femoral artery and an 8 French sheath was placed. Real-time ultrasound guidance was utilized for vascular access including the acquisition of a permanent ultrasound image documenting patency of the accessed vessel. Under fluoroscopy, an 8 [place] Walrus balloon guide catheter was navigated over a 6 [place]  VTK catheter and a 0.035" Terumo Glidewire into the aortic arch. The catheter was placed into the left common carotid artery and then advanced into the left internal carotid artery. The diagnostic catheter was removed. Frontal and lateral angiograms of the head were obtained. FINDINGS: 1. Ultrasound showed heavily calcified right common femoral artery is maintained patency and caliber. 2. Proximal occlusion of a left M2/MCA anterior division branch. 3. Atherosclerotic changes of the intracranial left ICA with mild stenosis at the distal cavernous segment. 4. 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 PROCEDURE: Using biplane roadmap guidance, a Red 62 aspiration catheter was navigated over Colossus 35 microguidewire into the cavernous segment of the left ICA. The aspiration catheter was then advanced to the level of occlusion and connected to an aspiration pump. Continuous aspiration was performed  for 2 minutes. The guide catheter was connected to a VacLok syringe and the guiding catheter balloon was inflated. The aspiration catheter was subsequently removed under constant aspiration. The guide catheter was aspirated for debris. Left internal carotid artery angiograms with frontal and lateral views of the head showed complete recanalization of the left MCA vascular tree. The guide catheter was retracted into the neck. Frontal and lateral angiograms of the neck were obtained. Improvement of the degree of stenosis in the carotid bulb compared to prior CT angiogram. There is prominent luminal irregularity at the carotid bulb residual moderate stenosis. Increased tortuosity of the proximal/mid cervical left ICA with kinking. Left internal carotid artery angiograms with left anterior oblique views of the neck showed evidence of filling defects at the level of the carotid bulb. Flat panel CT of the head was obtained  and post processed in a separate workstation with concurrent attending physician supervision. Selected images were sent to PACS. No evidence of hemorrhagic complication. There is mild contrast staining of the left insular and frontal cortex. Repeat left internal carotid artery angiograms with frontal and lateral views of the head showed Amy seen left M3/MCA branch to the left parietal region. Left common carotid artery angiograms with frontal and lateral views of the neck showed vertebra Gretchen of stenosis at the left ICA bulb with more prominent filling defect. At this point, patient was loaded on cangrelor followed by continuous drip. Using biplane roadmap guidance, a 4-7 mm Emboshield NAV6 cerebral protection device was advanced into the cervical left ICA. However, multiple attempts to advance a cerebral protection device through the left ICA kinking proved unsuccessful. The cerebral protection device was subsequently removed. Using biplane roadmap guidance, a 10-8 x 40 mm XACT carotid stent was navigated and deployed from the distal left common carotid artery to the proximal left internal carotid artery, proximal to the vessel kinking. Suboptimal stent expansion was noted. Then, a 6 x 30 mm Viatrac balloon was navigated into the recently deployed stent. Angioplasty was performed under fluoroscopy. Left internal carotid artery angiograms with frontal and lateral views of the neck showed adequate stent positioning and expansion with brisk anterograde flow. Left internal carotid artery angiograms with frontal and lateral views of the head showed improvement of anterograde flow in the left MCA vascular tree with brisk anterograde flow. Delayed left common carotid artery angiograms with frontal and lateral views of the neck showed no evidence of clot formation within the stent. The catheter was subsequently Riddle. Right common femoral artery angiogram was obtained in right anterior oblique view. The puncture is at  the level of the common femoral artery. The artery has normal caliber, adequate for closure device. The sheath was exchanged over the wire for an 8 [place] Angio-Seal which was utilized for access closure. Immediate hemostasis was achieved. IMPRESSION: 1. Successful mechanical thrombectomy for treatment of a proximal left M2/MCA anterior division branch occlusion achieving complete recanalization (TICI 3). 2. 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 PLAN: Continue cangrelor infusion until patient is transitioned to oral dual antiplatelet therapy. Electronically Signed   By: Baldemar Lenis M.D.   On: 06/05/2023 13:16   CT ANGIO HEAD NECK W WO CM W PERF (CODE STROKE) Addendum Date: 06/05/2023 ADDENDUM REPORT: 06/05/2023 12:25 ADDENDUM: Please note, there is a dictation error within CTA neck impression #1, which should read: The common carotid and internal carotid arteries  are patent within the neck. Atherosclerotic plaque bilaterally. Most notably, there is progressive atherosclerotic plaque about the left carotid bifurcation and within the proximal left ICA with resultant severe near occlusive stenosis of the proximal left ICA. Also of note, atherosclerotic plaque about the right carotid bifurcation results in a 40% stenosis at the right ICA origin. Electronically Signed   By: Jackey Loge D.O.   On: 06/05/2023 12:25   Result Date: 06/05/2023 CLINICAL DATA:  Provided history: Cerebrovascular accident, unspecified mechanism. Right-sided weakness. Right-sided facial droop. Altered mental status. EXAM: CT ANGIOGRAPHY HEAD AND NECK CT PERFUSION BRAIN TECHNIQUE: Multidetector CT imaging of the head and neck was performed using the standard protocol during bolus administration of intravenous contrast. Multiplanar CT image reconstructions and MIPs were obtained  to evaluate the vascular anatomy. Carotid stenosis measurements (when applicable) are obtained utilizing NASCET criteria, using the distal internal carotid diameter as the denominator. Multiphase CT imaging of the brain was performed following IV bolus contrast injection. Subsequent parametric perfusion maps were calculated using RAPID software. RADIATION DOSE REDUCTION: This exam was performed according to the departmental dose-optimization program which includes automated exposure control, adjustment of the mA and/or kV according to patient size and/or use of iterative reconstruction technique. CONTRAST:  OMNIPAQUE IOHEXOL 350 MG/ML SOLN COMPARISON:  Noncontrast head CT performed earlier today 06/05/2023. MRA head and MRA neck 10/04/2019. FINDINGS: CTA NECK FINDINGS Aortic arch: Common origin of the innominate and left common carotid arteries. Atherosclerotic plaque within the visualized thoracic aorta and proximal major branch vessels of the neck. Streak/beam hardening artifact arising from a dense right-sided contrast bolus partially obscures the right subclavian artery. Within this limitation, there is no appreciable hemodynamically significant innominate or proximal subclavian artery stenosis. Right carotid system: CCA and ICA patent within the neck. Atherosclerotic plaque, greatest about the carotid bifurcation. Resultant 40% stenosis at the ICA origin. Left carotid system: CCA and ICA patent within the neck. Atherosclerotic plaque. Most notably, there is prominent atherosclerotic plaque about the carotid bifurcation and within the proximal ICA which has progressed from the prior MRA neck of 10/04/2019. Resultant severe (near occlusive) stenosis of the proximal ICA. Tortuosity of the cervical ICA Vertebral arteries: The vertebral arteries are patent within the neck. Streak/beam hardening artifact limits evaluation of the right vertebral artery origin. At least moderate stenosis is suspected at this  site. Atherosclerotic plaque scattered elsewhere within the cervical right vertebral artery with no more than mild stenosis. Calcified atherosclerotic plaque at the left vertebral artery origin with suspected at least moderate stenosis. Nonstenotic calcified plaque elsewhere within the cervical left vertebral artery. Skeleton: Cervical spondylosis. Other neck: No neck mass or cervical lymphadenopathy. Upper chest: No consolidation within the imaged lung apices. Review of the MIP images confirms the above findings CTA HEAD FINDINGS Anterior circulation: The intracranial internal carotid arteries are patent. As sclerotic plaque within both vessels. No more than mild stenosis on the right. Up to moderate stenosis within the left cavernous segment. The M1 middle cerebral arteries are patent. Abrupt occlusion of a proximal M2 left middle cerebral artery vessel (series 11, image 22). Atherosclerotic irregularity of the M2 and more distal MCA vessels elsewhere. The anterior cerebral arteries are patent. Atherosclerotic irregularity of both vessels without high-grade proximal stenosis. A possible 2 mm periophthalmic left ICA aneurysm with better appreciated on the prior MRA head of 10/04/2019. Posterior circulation: The intracranial vertebral arteries are patent. Atherosclerotic plaque within the right V4 segment sites of mild stenosis. Non-stenotic atherosclerotic plaque within the left  V4 segment. The basilar artery is patent. The posterior cerebral arteries are patent. Posterior communicating arteries are diminutive or absent, bilaterally. Venous sinuses: Assessment for dural venous sinus thrombosis is limited due to contrast timing. Anatomic variants: As described. Review of the MIP images confirms the above findings CT Brain Perfusion Findings: ASPECTS: CBF (<30%) Volume: 41mL Perfusion (Tmax>6.0s) volume: 86mL Mismatch Volume: 45mL Infarction Location:Left MCA vascular territory CTA head impression #1, the CT  perfusion head impression and the presence of a severe stenosis of the proximal cervical left ICA called by telephone at the time of interpretation on 06/05/2023 at 8:40 am to provider ERIC [facility] , who verbally acknowledged these results. IMPRESSION: CTA neck: 1. No common carotid and internal carotid arteries are patent within the neck. Atherosclerotic plaque bilaterally. Most notably, there is progressive atherosclerotic plaque about the left carotid bifurcation and within the proximal left ICA with resultant severe, near occlusive stenosis of the proximal left ICA. Also of note, atherosclerotic plaque about the right carotid bifurcation results in 40% stenosis at the right ICA origin. 2. The vertebral arteries are patent within the neck. Atherosclerotic plaque bilaterally as described. Most notably, there is suspected at least moderate stenoses at the bilateral vertebral artery origins. 3. Aortic Atherosclerosis (ICD10-I70.0). CTA head: 1. Abrupt occlusion of a proximal M2 left middle cerebral artery vessel. 2. Background intracranial atherosclerotic disease as described. 3. A possible 2 mm periophthalmic left ICA aneurysm was better appreciated on the prior MRA head of 10/04/2019. CT perfusion head: The perfusion software identifies a 41 mL core infarct in the left MCA vascular territory. The perfusion software identifies an 86 mL region of critically hypoperfused parenchyma within the left MCA vascular territory (utilizing the Tmax>6 seconds threshold). Reported mismatch volume: 45 mL Electronically Signed: By: Jackey Loge D.O. On: 06/05/2023 09:07   CT HEAD CODE STROKE WO CONTRAST Result Date: 06/05/2023 CLINICAL DATA:  Code stroke. Neuro deficit, acute, stroke suspected. EXAM: CT HEAD WITHOUT CONTRAST TECHNIQUE: Contiguous axial images were obtained from the base of the skull through the vertex without intravenous contrast. RADIATION DOSE REDUCTION: This exam was performed according to the departmental  dose-optimization program which includes automated exposure control, adjustment of the mA and/or kV according to patient size and/or use of iterative reconstruction technique. COMPARISON:  Brain MRI 10/04/2019.  Noncontrast head CT 10/03/2019. FINDINGS: Brain: Generalized cerebral atrophy. Loss of gray-white differentiation consistent with an acute infarct within the left insula and within portions of the left frontal operculum (MCA vascular territory). Known small chronic cortically-based infarcts within the left frontal, left parietal and left occipital lobes were better appreciated on the prior brain MRI of 10/04/2019 (acute at that time). Mild patchy and ill-defined hypoattenuation within the cerebral white matter, nonspecific but compatible with chronic small vessel ischemic disease. Subcentimeter infarct within the superior right cerebellar hemisphere, new from the prior MRI but chronic in appearance. Loss of gray-white differentiation there is no acute intracranial hemorrhage. No extra-axial fluid collection. No evidence of an intracranial mass. No midline shift. Vascular: No hyperdense vessel.  Atherosclerotic calcifications. Skull: No calvarial fracture or aggressive osseous lesion. Sinuses/Orbits: No mass or acute finding within the imaged orbits. No significant paranasal sinus disease. ASPECTS [facility] Stroke Program Early CT Score) - Ganglionic level infarction (caudate, lentiform nuclei, internal capsule, insula, M1-M3 cortex): 5 - Supraganglionic infarction (M4-M6 cortex): 2 Total score (0-10 with 10 being normal): 7 Impression #1 called by telephone at the time of interpretation on 06/05/2023 at 8:40 am to provider Dr. Otelia Limes,  who verbally acknowledged these results. IMPRESSION: 1. Acute left MCA territory infarct affecting the left insula and portions of the left frontal operculum. ASPECTS is 7. 2. Known small chronic cortically-based infarcts within the left frontal, left parietal and left occipital  lobes were better appreciated on the prior brain MRI of 10/04/2019 (acute at that time). 3. Background mild cerebral white matter chronic small vessel ischemic disease. 4. Subcentimeter infarct within the right cerebellar hemisphere, new from prior MRI but chronic in appearance. 5. Generalized cerebral atrophy. Electronically Signed   By: Jackey Loge D.O.   On: 06/05/2023 08:45     PHYSICAL EXAM  Temp:  [98.2 F (36.8 C)-99.6 F (37.6 C)] 98.3 F (36.8 C) (03/04 1600) Pulse Rate:  [103-118] 106 (03/04 1845) Resp:  [0-37] 24 (03/04 1900) BP: (87-139)/(34-88) 122/57 (03/04 1900) SpO2:  [91 %-100 %] 100 % (03/04 1845) FiO2 (%):  [40 %] 40 % (03/04 1326) Weight:  [141.2 kg] 141.2 kg (03/04 0500)  General -critically ill intubated elderly patient  Cardiovascular -regular rhythm on monitor  Neuro: Still intubated and vent dependent.  On Precedex and fentanyl.  He is unresponsive and consistent with a sedated exam.  Patient does not open eyes with movement or repositioning as CT head yesterday, does not resist against forced eye opening.  No blink to threat bilaterally weak cough and gag.  No withdrawal to pain on any extremity.  ASSESSMENT/PLAN Mr. Hoyte Ziebell. is a 83 y.o. male with history of hypertension, diabetes, CKD 3, stroke admitted for right-sided weakness numbness, aphasia, right facial droop, left gaze preference. No TNK given due to outside window.  Patient underwent mechanical thrombectomy with left ICA stenting.  On 2/15, patient was intubated after episodes of coffee-ground emesis, tachypnea, tachycardia.  Transferred to ICU and CCM took over as attending.  Encephalopathy Worsening encephalopathy in the setting of worsening renal failure, continued pressor support and on sedation. Neuroexam no improvement CT repeat showed no acute changes but evolving left MCA infarcts with petechial hemorrhage and laminar necrosis. Continue palliative care involvement for GOC  discussion  Stroke:  left MCA infarct with left M2 occlusion and left ICA near occlusion s/p IR with TICI3 and left ICA stenting, likely secondary to large vessel disease source versus cardiomyopathy with low EF CT left MCA infarct CT head and neck left M2 occlusion, left ICA near occlusion, right ICA 43 stenosis, bilateral VA origin severe stenosis CTP 41/86 Status post IR with TICI3 and left ICA stenting MRI left MCA infarct at left insular and left frontal operculum, prominent and confluent petechial hemorrhagic transformation CT repeat 2/7 stable confluent petechial hemorrhage CT repeat 3/4 no acute changes but evolving left MCA infarcts with petechial hemorrhage and laminar necrosis. 2D Echo EF 30% LDL 107 HgbA1c 6.1 P2 Y12 = 73 UDS negative SCDs for VTE prophylaxis aspirin 81 mg daily and clopidogrel 75 mg daily prior to admission, now on ASA.  Plavix was DC'd 2/17 due to severe anemia, continue to hold Ongoing aggressive stroke risk factor management Therapy recommendations:  SNF Disposition: Pending   History of stroke 10/08/2019 admitted for left MCA infarct due to right upper extremity weakness.  MRA head and neck showed left ICA 30% stenosis.  EF 45 to 50%.  LDL 105, A1c 5.6.  Recommended loop recorder at that time but only got 30-day CardioNet monitoring which was no A-fib.  Discharged on DAPT and Lipitor 80.  Respiratory failure Leukocytosis On 2/15, patient had episode of emesis with probable  subsequent aspiration and went into respiratory distress with increased work of breathing unable to maintain SpO2 After discussion with family, patient intubated for airway protection and respiratory failure continue precedex and fentanyl Ventilator management per CCM, appreciate assistance Fever Tmax 102.5--101.4--afebrile  WBC 10.1--23.5--44.5--41.4--29.9--19.5--7.4--4.9--5.7-14.3>13.3--15.2->19.8--9.5 Continue cefepime Rocephin course completed Off vancomycin  GI  bleeding Acute blood loss anemia Patient had 2 episodes of coffee-ground emesis, likely stress ulcer Plavix discontinued 2/27 due to severe anemia, ASA continued Resumed ASA 2/20  Hemoglobin 11.2-> 8.4->PRBC->8.8->8.1->7.6>8.7>8.0>5.7->2u PRBC->8.1->8.8-> 6.4->1U PRBC-> 7.4-> 7.1 Continue Close monitoring in ICU  Cardiomyopathy CHF 09/2019 EF 45 to 50% Current admission: EF 30% Cardiology on board, appreciate assistance Continue ASA Agree with GDMT (losartan, entresto, metoprolol and spironolactone) once BP and Cr tolerates  Diabetes HgbA1c 6.1 goal < 7.0 Hyperglycemia improved  Off insulin drip CBG monitoring SSI  DM education and close PCP follow up  History of hypertension, now hypotensive Unstable, requiring pressor support Continued Levophed and vasopressin Long term BP goal normotensive  Hyperlipidemia Home meds: Lipitor 80 LDL 105, goal < 70 Now on lipitor 80 and Zetia  Continue statin and zetia if able to take NPO at discharge  Dysphagia Poststroke dysphagia Speech on board N.p.o.  OG tube Osmolite 1.5 @ 54ml/hr ProSource supplement BID  AKI on CKD  Cre 1.78->3.41-> 4.61->4.07-> 3.85->3.66->4.19->5.66 --> 6.92>2.35>2.5>2.32->2.00-> 4.15-> 5.49 Baseline creatinine around 1.5 Aggressively treat hypotension, on 2 pressors Nephrology on board Not tolerating CCRT  Other Stroke Risk Factors Advanced age Obesity, Body mass index is 44.67 kg/m.   Other Active Problems Presyncopal episode with mild hypotension 2/14-suspect was due to straining to have a bowel movement, will prescribe as needed suppository  Hospital day # 27  Neurology has nothing to add at this time, recommend continued palliative care involvement. Please call with any questions. Thanks for the consult.  Marvel Plan, MD PhD Stroke Neurology 07/02/2023 7:22 PM  This patient is critically ill due to stroke status post IR, respiratory failure, hypotension needing pressor, AKI, aspiration  pneumonia and GI bleeding and at significant risk of neurological worsening, death form sepsis, septic shock, severe anemia, heart failure and renal failure. This patient's care requires constant monitoring of vital signs, hemodynamics, respiratory and cardiac monitoring, review of multiple databases, neurological assessment, discussion with family, other specialists and medical decision making of high complexity. I spent 35 minutes of neurocritical care time in the care of this patient. I had long discussion with granddaughter at bedside, updated pt current condition, treatment plan and potential prognosis, and answered all the questions. She expressed understanding and appreciation.  I also discussed with CCM Dr. Everardo All

## 2023-07-02 NOTE — Progress Notes (Signed)
 Pt transported on vent to CT and back without any complications. RN at beside, RT will monitor as needed.

## 2023-07-02 NOTE — Progress Notes (Signed)
 Notified MD of low diastolic pressure and sustained tachycardia. Notified MD of persistent decreased urinary output. Spoke with CCM MD. No new orders at this time. This RN will continue to monitor and assess the pt and notify of any changes.

## 2023-07-02 NOTE — Progress Notes (Signed)
 Pt noted to have 2+ non-reactive pupil. Dr. Francine Graven and Amada Jupiter informed. Per Dr. Amada Jupiter, "Dr. Roda Shutters informed and updated".

## 2023-07-02 NOTE — Plan of Care (Signed)
   Problem: Education: Goal: Knowledge of disease or condition will improve Outcome: Progressing   Problem: Ischemic Stroke/TIA Tissue Perfusion: Goal: Complications of ischemic stroke/TIA will be minimized Outcome: Progressing

## 2023-07-02 NOTE — Progress Notes (Signed)
 Pt noted to have tooth laying in his mouth when PT went to work with pt. This tooth placed in dental container labeled with pt sticker at computer top in room. Pt's wife updated about tooth.

## 2023-07-02 NOTE — Progress Notes (Addendum)
 NAME:  Nathan Sortor., MRN:  [id number], DOB:  [birth year], LOS: 27 ADMISSION DATE:  06/05/2023 CONSULTATION DATE:  06/05/2023 REFERRING MD:  Otelia Limes - Neuro, CHIEF COMPLAINT: Code Stroke, post-NIR   History of Present Illness:  83 year old man who presented to [facility] ED 2/5 via EMS for unresponsiveness and R-sided deficits. PMHx significant for HTN, HLD, prior CVA without residual deficits, T2DM, CKD stage IIIb, prostate CA, gout.  Patient presented to [facility] ED with significant R-sided deficits, aphasia and poor responsiveness. LKW 2200. Code Stroke initiated. On ED arrival, patient was afebrile with HR 86, BP 154/67, RR 20, SpO2 100%. Noted aphasia, R-sided hemiplegia, R-sided facial droop. Labs were notable for WBC 6.8. Hgb 11.9, Plt 181. INR 1.0. Na 142, K 3.8, CO2 20, Cr 1.59 (baseline), LFTs WNL. Ethanol < 10. COVID negative. CT Head with acute L MCA territory infarct affecting L insula/L frontal operculum, small chronic cortically-based infarcts of L frontal/L parietal/L occipital lobes. CTA Head/Neck with abrupt occlusion of proximal M2 L MCA vessel, ?2mm periophthalmic L ICA aneurysm, bilateral ICA plaque with progressive atherosclerotic plaque L carotid bifurcation with proximal L ICA severe, near-occlusive stenosis.   Taken to [facility] emergently for mechanical thrombectomy ([name]). TICI 3 revascularization achieved. L ICA stenosis and plaque rupture noted at the bulb, treated with stenting/angioplasty. Extubated post-procedure and maintained on cangrelor gtt with plan for transition to DAPT.  PCCM consulted for post-procedure medical management.  Pertinent Medical History:   Past Medical History:  Diagnosis Date   Anemia of chronic disease    Ankle fracture 02/15/2016   Cancer (HCC)    Prostate   Chronic constipation    Chronic kidney disease    stage III   Diabetes mellitus without complication (HCC)    type II    Diabetic peripheral neuropathy (HCC)    Failure to  thrive (0-17)    Fracture of left lower leg    Gout    Hyperlipidemia    Hypertension    Morbid obesity (HCC)    Unstable gait    Significant Hospital Events: Including procedures, antibiotic start and stop dates in addition to other pertinent events   2/5 - Presented to [facility] ED with poor responsiveness, aphasia, R-sided deficits. CT Head with L MCA territory infarct. CTA Head/Neck with occlusion of L MCA M2 segment and severe near-occlusive stenosis of L ICA. Taken to [facility] for MT, stent and angioplasty ([name]). PCCM consulted for post-procedure management. 2/6 transfer out of ICU 2/15 aspiration event early a.m. resulting in respiratory distress, transferred back to ICU and emergently intubated with development of severe shock requiring epi, levo, vaso. Transfused PRBC x1 for possible GI bleed 2/16: 3 pressors, weaning. Coox without cardiogenic component. Antibiotics vanc/cefepime. CT CAP with SBO  2/18: weaned off to only levo now which is likely sedation related  2/20: UOP drop overnight with ongoing poor renal function. Weaning sedation to evaluate neuro status.  2/22: started on trial of CRRT 2/23: HD catheter exchanged for longer one - flow improved. More awake by afternoon 2/24 tried to wean Precedex and pt got quite uncomfortable/agitated. Required Fentanyl infusion addition. Got hypotensive later in day, increase in NE  2/25 NE back down and almost off. Started pulling 50cc/hr from CRRT 2/26 remains on NE 11, CRRT ongoing 2/27: levo going up to 24, on vaso. CRRT net even. Made DNR today yesterday.  Received 2 units of PRBCs 3/1: CRRT turned off as blood pressure could not tolerate with increasing  pressor requirements   Interim History / Subjective:   Improved vasopressor requirement with levophed 4 On minimal vent settings RN notified team of neuro change of unresponsive pupils  Objective:  Blood pressure (!) 122/55, pulse (!) 108, temperature 98.5 F (36.9 C),  temperature source Axillary, resp. rate (!) 36, height 5\' 10"  (1.778 m), weight (!) 141.2 kg, SpO2 100%.    Vent Mode: PRVC FiO2 (%):  [40 %] 40 % Set Rate:  [22 bmp] 22 bmp Vt Set:  [580 mL] 580 mL PEEP:  [8 cmH20] 8 cmH20 Plateau Pressure:  [20 cmH20-22 cmH20] 20 cmH20   Intake/Output Summary (Last 24 hours) at 07/02/2023 0945 Last data filed at 07/02/2023 0700 Gross per 24 hour  Intake 2112.4 ml  Output 135 ml  Net 1977.4 ml   Filed Weights   06/29/23 0500 07/01/23 0500 07/02/23 0500  Weight: 134.3 kg (!) 139.8 kg (!) 141.2 kg   Physical Exam: General: Well-appearing, no acute distress HENT: [place], AT, ETT in place Eyes: EOMI, no scleral icterus Respiratory: Diminished to auscultation bilaterally.  No crackles, wheezing or rales Cardiovascular: RRR, -M/R/G, no JVD GI: BS+, soft, nontender Extremities:-Edema,-tenderness Neuro: Sedated, PERRL with 2mm nonreactive pupils  Imaging, labs and test noted above have been reviewed independently by me. Na 130 BUN/Cr 84/5.49 WBC 9.5 Hg 7.1  CXR 3/1 Bilateral pleural effusions with bibasilar atelectasis, interstitial edema improved compared to prior   Assessment & Plan:Resolved Hospital Problem List   Acute hypoxemic respiratory failure due to aspiration Klebsiella pneumonia  - treated.  Ileus and swallowing dysfunction from stroke probably contributed.  Left MCA M2 occlusion and left ICA near occlusion now status post thrombectomy with complete revascularization Likely secondary to large vessel obstruction Heart failure with reduced ejection fraction ejection fraction of 30% with global hypokinesis, normal right ventricular function Hypertension Hyperlipidemia Type 2 diabetes Acute kidney injury superimposed on chronic kidney disease stage IIIb NSVT AM 2/25 Acute blood loss anemia, unclear etiology; may be stress ulcer related vs critical illness. No bloody BM, or OGT output.  Goals of care - poor prognosis for meaningful  recovery and could be headed towards trach and possibly ESRD requiring HD Hypervolemic hyponatremia  Plan: Has been multiorgan system failure and unable to tolerate CRRT due to refractory shock. Not a candidate for long term iHD. New neuro status change. STAT CT head ordered Continue broad antibiotic coverage Not a candidate for weaning due to unstable critical condition Continue to wean pressors for goal MAP >65 Minimize sedation for RASS goal -1 Holding plavix for acute blood loss anemia. S/p PRBC x 1. F/u post-transfusion H/H  Goals of care Discussed with family, nephrology, palliative care. Will continue discussions We have stopped CRRT as he is unable to tolerate it due to hemodynamic status with no plans to resume Wife expresses understanding and is considering withdrawal of care but do not want to transition him to comfort measures yet. Awaiting family to come together They confirm no CPR and defib but they want him to remain on the ventilator for now.  Best Practice (right click and "Reselect all SmartList Selections" daily)   Diet/type: tubefeeds and NPO DVT prophylaxis SCD Pressure ulcer(s): N/A GI prophylaxis: PPI Lines: Central line and yes and it is still needed Foley:  Yes, and it is still needed Code Status:  DNR Last date of multidisciplinary goals of care discussion: ongoing palliative discussion. DNR   The patient is critically ill with multiple organ systems failure and requires high complexity decision  making for assessment and support, frequent evaluation and titration of therapies, application of advanced monitoring technologies and extensive interpretation of multiple databases.  Independent Critical Care Time: 45 Minutes.   Mechele Collin, M.D. [facility] Pulmonary/Critical Care Medicine 07/02/2023 9:46 AM   Please see Amion for pager number to reach on-call Pulmonary and Critical Care Team.

## 2023-07-02 NOTE — Progress Notes (Signed)
 Palliative:  HPI: 83 y.o. male with past medical history of HTN, HLD, diabetes, CKD stage 3b, prior CVA with no residual deficits, prostate cancer, failure to thrive admitted on 06/05/2023 with unresponsiveness with right-sided deficits found to have left MCA infarct with left M2 occlusion and left ICA near occlusion s/p IR with mechanical thrombectomy and stenting 2/5. 2/15 aspiration event resulting in severe shock requiring intubation. Hospitalization also complicated by acute GIB and CHF EF 30%.    I met today at Mr. Fortson' bedside along with 2 daughters and 1 granddaughter. We briefly reviewed his status. I explained that his status is much unchanged although his kidney function continues to worsen. I gently explained that the amount of support he needs fluctuates and he needs some more days than others but the support we are providing is to keep him alive but unfortunately not helping him to improve.   Update: I met today with Mrs. Carothers along with daughter Ines Bloomer and other family that I spoke with earlier. We reviewed his status. Mrs. [place] shares that she is ready for one way extubation. We discussed expectations and how to best care for him. She wishes to keep vasopressor support so we spent time verifying the priority of comfort. We discussed medications for comfort and use of opioid. We discussed that the vasopressors would be counterproductive if he is requiring comfort meds. After discussion we agreed that comfort is our first priority and that we would not escalate vasopressor support. They will make calls and are awaiting son to return to hospital to extubate.   Daughter, Ines Bloomer, updates me that her family that was here overnight shared with them that the RN made comment about "all the urine" in the bed when cleaning him last night. Now Mrs. Warshaw is hesitant to extubate and wants to wait. I explained to Shawn that kidney function continues to worsen each day as shown in blood work.  Shawn understands. She will continue to speak with her family about path forward. I offered support to family. I will follow up again tomorrow.   Discussed with Dr. Everardo All.   Exam: Ill appearing. Sedated - not following commands. HR 90s. Vasopressors ongoing. Ongoing vent support 40% FiO2. Abd soft.   Plan: - DNR - No further CRRT - Wife considering one way extubation  55 min  Yong Channel, NP Palliative Medicine Team Pager [phone] (Please see [email or web address] for schedule) Team Phone [phone]

## 2023-07-02 NOTE — Progress Notes (Signed)
 Pt noted to have VT and non-sustained VT around 1020-1022. Pt's leads LA was displaced during this alarm. Dr. Everardo All informed.

## 2023-07-02 NOTE — Consult Note (Signed)
 WOC Nurse wound follow up Wound type: Scrotal wound and sacrum. Pressure injury stage 3. Measurement: Scrotum - 2 open wounds 2.5 x 1 cm each. Sacral wound - 3.5 cm x 1 cm. Wound bed: 100% red Drainage (amount, consistency, odor) scant amount. Periwound: Intact, cover by Desitin. Dressing procedure/placement/frequency:  Apply Medihoney daily. Cover with foam dressing. Apply Desitin on folds (buttock) daily or PRN soiling.  WOC team will follow weekly.   Please reconsult if further assistance is needed. Thank-you,  Denyse Amass BSN, RN, [employer], WOC  (Pager: [phone])

## 2023-07-03 DIAGNOSIS — E1165 Type 2 diabetes mellitus with hyperglycemia: Secondary | ICD-10-CM | POA: Diagnosis not present

## 2023-07-03 DIAGNOSIS — N179 Acute kidney failure, unspecified: Secondary | ICD-10-CM | POA: Diagnosis not present

## 2023-07-03 DIAGNOSIS — Z515 Encounter for palliative care: Secondary | ICD-10-CM | POA: Diagnosis not present

## 2023-07-03 DIAGNOSIS — G9340 Encephalopathy, unspecified: Secondary | ICD-10-CM

## 2023-07-03 DIAGNOSIS — I639 Cerebral infarction, unspecified: Secondary | ICD-10-CM | POA: Diagnosis not present

## 2023-07-03 DIAGNOSIS — N1832 Chronic kidney disease, stage 3b: Secondary | ICD-10-CM | POA: Diagnosis not present

## 2023-07-03 DIAGNOSIS — Z7189 Other specified counseling: Secondary | ICD-10-CM | POA: Diagnosis not present

## 2023-07-03 LAB — GLUCOSE, CAPILLARY
Glucose-Capillary: 331 mg/dL — ABNORMAL HIGH (ref 70–99)
Glucose-Capillary: 256 mg/dL — ABNORMAL HIGH (ref 70–99)
Glucose-Capillary: 279 mg/dL — ABNORMAL HIGH (ref 70–99)
Glucose-Capillary: 270 mg/dL — ABNORMAL HIGH (ref 70–99)
Glucose-Capillary: 271 mg/dL — ABNORMAL HIGH (ref 70–99)
Glucose-Capillary: 305 mg/dL — ABNORMAL HIGH (ref 70–99)

## 2023-07-03 LAB — CBC
HCT: 23.7 % — ABNORMAL LOW (ref 39.0–52.0)
Hemoglobin: 7.3 g/dL — ABNORMAL LOW (ref 13.0–17.0)
MCH: 29.6 pg (ref 26.0–34.0)
MCHC: 30.8 g/dL (ref 30.0–36.0)
MCV: 96 fL (ref 80.0–100.0)
Platelets: 116 10*3/uL — ABNORMAL LOW (ref 150–400)
RBC: 2.47 MIL/uL — ABNORMAL LOW (ref 4.22–5.81)
RDW: 16.5 % — ABNORMAL HIGH (ref 11.5–15.5)
WBC: 9.4 10*3/uL (ref 4.0–10.5)
nRBC: 0 % (ref 0.0–0.2)

## 2023-07-03 LAB — BASIC METABOLIC PANEL
Anion gap: 13 (ref 5–15)
BUN: 113 mg/dL — ABNORMAL HIGH (ref 8–23)
CO2: 18 mmol/L — ABNORMAL LOW (ref 22–32)
Calcium: 9.3 mg/dL (ref 8.9–10.3)
Chloride: 98 mmol/L (ref 98–111)
Creatinine, Ser: 6.44 mg/dL — ABNORMAL HIGH (ref 0.61–1.24)
GFR, Estimated: 8 mL/min — ABNORMAL LOW (ref >60)
Glucose, Bld: 302 mg/dL — ABNORMAL HIGH (ref 70–99)
Potassium: 5.6 mmol/L — ABNORMAL HIGH (ref 3.5–5.1)
Sodium: 129 mmol/L — ABNORMAL LOW (ref 135–145)

## 2023-07-03 LAB — MAGNESIUM: Magnesium: 2.6 mg/dL — ABNORMAL HIGH (ref 1.7–2.4)

## 2023-07-03 LAB — POTASSIUM: Potassium: 5.4 mmol/L — ABNORMAL HIGH (ref 3.5–5.1)

## 2023-07-03 LAB — PHOSPHORUS: Phosphorus: 5.8 mg/dL — ABNORMAL HIGH (ref 2.5–4.6)

## 2023-07-03 MED ORDER — INSULIN ASPART 100 UNIT/ML IJ SOLN
8.0000 [IU] | INTRAMUSCULAR | Status: DC
  Administered 2023-07-03 – 2023-07-04 (×5): 8 [IU] via SUBCUTANEOUS

## 2023-07-03 MED ORDER — INSULIN GLARGINE 100 UNIT/ML ~~LOC~~ SOLN
15.0000 [IU] | Freq: Every day | SUBCUTANEOUS | Status: DC
  Administered 2023-07-03: 15 [IU] via SUBCUTANEOUS
  Filled 2023-07-03 (×2): qty 0.15

## 2023-07-03 MED ORDER — SODIUM ZIRCONIUM CYCLOSILICATE 10 G PO PACK
10.0000 g | PACK | Freq: Every day | ORAL | Status: DC
  Administered 2023-07-04 – 2023-07-05 (×2): 10 g
  Filled 2023-07-03 (×2): qty 1

## 2023-07-03 MED ORDER — INSULIN ASPART 100 UNIT/ML IV SOLN
5.0000 [IU] | Freq: Once | INTRAVENOUS | Status: AC
  Administered 2023-07-03: 5 [IU] via INTRAVENOUS

## 2023-07-03 MED ORDER — ALBUTEROL SULFATE (2.5 MG/3ML) 0.083% IN NEBU
10.0000 mg | INHALATION_SOLUTION | Freq: Once | RESPIRATORY_TRACT | Status: AC
  Administered 2023-07-03: 10 mg via RESPIRATORY_TRACT
  Filled 2023-07-03: qty 12

## 2023-07-03 MED ORDER — SODIUM ZIRCONIUM CYCLOSILICATE 10 G PO PACK
10.0000 g | PACK | Freq: Once | ORAL | Status: AC
  Administered 2023-07-03: 10 g
  Filled 2023-07-03: qty 1

## 2023-07-03 NOTE — Progress Notes (Signed)
 eLink Physician-Brief Progress Note Patient Name: Nathan Proctor. DOB: 12/23/1940 MRN: [id number]   Date of Service  07/03/2023  HPI/Events of Note  potassium 5.6, creatinine 6.44, phos 5.8, sodium 129 Patient has cortrak  No reported EKG changes CBG 300s  eICU Interventions  Ordered Lokelma, Insulin, albuterol to shift Nephrology to be informed Discussed with [facility]     Intervention Category Intermediate Interventions: Electrolyte abnormality - evaluation and management  Darl Pikes 07/03/2023, 5:50 AM

## 2023-07-03 NOTE — TOC Progression Note (Signed)
 Transition of Care (TOC) - Progression Note    Patient Details  Name: Nathan Proctor. MRN: [id number] Date of Birth: June 11, 1940  Transition of Care [facility]) CM/SW Contact  Mearl Latin, LCSW Phone Number: 07/03/2023, 9:59 AM  Clinical Narrative:    TOC continuing to follow. Remains on vent.   Expected Discharge Plan: Skilled Nursing Facility Barriers to Discharge: Continued Medical Work up, English as a second language teacher  Expected Discharge Plan and Services In-house Referral: Clinical Social Work   Post Acute Care Choice: Skilled Nursing Facility Living arrangements for the past 2 months: Single Family Home                                       Social Determinants of Health (SDOH) Interventions SDOH Screenings   Food Insecurity: Patient Unable To Answer (06/07/2023)  Housing: Patient Unable To Answer (06/07/2023)  Transportation Needs: Patient Unable To Answer (06/07/2023)  Utilities: Patient Unable To Answer (06/07/2023)  Depression (PHQ2-9): Low Risk  (12/03/2019)  Social Connections: Patient Unable To Answer (06/07/2023)  Tobacco Use: Medium Risk (06/13/2023)    Readmission Risk Interventions    06/26/2023    5:05 PM  Readmission Risk Prevention Plan  Transportation Screening Complete  Medication Review (RN Care Manager) Complete  PCP or Specialist appointment within 3-5 days of discharge Complete  HRI or Home Care Consult Complete  SW Recovery Care/Counseling Consult Complete  Palliative Care Screening Complete  Skilled Nursing Facility Complete

## 2023-07-03 NOTE — Progress Notes (Signed)
 Palliative:  HPI: 83 y.o. male with past medical history of HTN, HLD, diabetes, CKD stage 3b, prior CVA with no residual deficits, prostate cancer, failure to thrive admitted on 06/05/2023 with unresponsiveness with right-sided deficits found to have left MCA infarct with left M2 occlusion and left ICA near occlusion s/p IR with mechanical thrombectomy and stenting 2/5. 2/15 aspiration event resulting in severe shock requiring intubation. Hospitalization also complicated by acute GIB and CHF EF 30%.     I discussed today with RN and Dr. Everardo All. I met today with Brooke Bonito"). I had a long discussion with Butch about his father's health status and progression. We reviewed his progression with shock and multiorgan failure since aspiration event. We reviewed efforts that have been made with multiple trials of CRRT that have prolonged Mr. Tierce but has not helped him to actually improve. We discussed the use of CRRT and the limitations that we have been unable to pull off the fluid we would desire to truly optimize him. We discussed the role of fluid overload and how this also impacts breathing. We discussed Mr. Petillo' weak heart and how this was very stressed with CRRT. We reviewed that his father's kidney function continues to decline and although he has a very small amount of urine output this is nowhere close to what is needed to optimize his heart and breathing. I explained that my understanding from the kidney team is that his father is not a candidate for further dialysis as this has not been helpful for his father and was actually further stressing his heart.   Mitzie Na asked about tracheostomy. I explained that we could consider tracheostomy but I would hate to put Mr. Mathews through a surgery and potential pain when his kidneys continue to fail. We discussed the use of dialysis and the ventilator are both tools to do the work for the kidneys and the lungs but they are not fixing anything  for his father. We discussed that the unfortunate series of circumstances with ongoing stress and injury to his body has made it very difficult for his body to best utilize all the interventions that the medical team has aggressively provided to him over the past ~2.5 weeks. Daimian shares that he and his sister were wanting to know the options moving forward. After our discussion he realizes that we have exhausted all our options to help his father improve - I expressed that I wish I had a better answer for him. Petr expresses that they will have to continue to pray acknowledging that any healing will have to come from God. Shea will continue to speak with the rest of his family.   All questions/concerns addressed to the best of my ability. Emotional support provided.   Exam:  Ill appearing. Sedated - not following commands. HR 90s. Vasopressors ongoing. Ongoing vent support 50% FiO2. Abd soft.    Plan: - DNR - No further CRRT - Wife considering one way extubation (I did not speak with her today)  65 min  Yong Channel, NP Palliative Medicine Team Pager [phone] (Please see [email or web address] for schedule) Team Phone [phone]

## 2023-07-03 NOTE — Consult Note (Addendum)
 WOC Nurse wound follow up Refer to previous WOC consult notes. Pt is critically ill with multiple systemic factors which can impair healing.  He has a high BMI and is on a low air-loss mattress to reduce pressure.  Sacrum with Stage 3 pressure injury, red and moist, small amt tan drainage, 3.5X3.5X.2cm Posterior scrotum with 2  Stage 3 pressure injuries; red and moist, small amt tan drainage. Each is approx  2.5X1 X.2cm  Present on admission: No Dressing procedure/placement/frequency: Topical treatment orders provided for bedside nurses to perform as follows: Apply foam dressing to sacrum, change Q 3 days or PRN soiling.  Apply barrier cream to posterior scrotum wounds BID and PRN when turning or cleaning. WOC team will assess weekly to determine if a change in the plan of care is indicated at that time.  [address] MSN, RN, [place], [place], CNS [phone]

## 2023-07-03 NOTE — Progress Notes (Signed)
 Pt not tolerating SBT at this time.pt placed back on full vent support, pt tolerating well at this time, RN aware, RT will monitor as needed.      07/03/23 0725  Daily Weaning Assessment  Daily Assessment of Readiness to Wean Wean protocol criteria met (SBT performed)  SBT Method CPAP 5 cm H20 and PS 5 cm H20 (12/5)  Weaning Start Time 0725  Patient response (S)  Failed SBT terminated  Reason SBT Terminated (S)  RR > 35 breaths/min or Frequency/TV ratio >105 (Low VT)

## 2023-07-03 NOTE — Progress Notes (Signed)
 NAME:  Nathan Proctor., MRN:  [id number], DOB:  05-07-1940, LOS: 28 ADMISSION DATE:  06/05/2023 CONSULTATION DATE:  06/05/2023 REFERRING MD:  Otelia Limes - Neuro, CHIEF COMPLAINT: Code Stroke, post-NIR   History of Present Illness:  83 year old man who presented to [facility] ED 2/5 via EMS for unresponsiveness and R-sided deficits. PMHx significant for HTN, HLD, prior CVA without residual deficits, T2DM, CKD stage IIIb, prostate CA, gout.  Patient presented to [facility] ED with significant R-sided deficits, aphasia and poor responsiveness. LKW 2200. Code Stroke initiated. On ED arrival, patient was afebrile with HR 86, BP 154/67, RR 20, SpO2 100%. Noted aphasia, R-sided hemiplegia, R-sided facial droop. Labs were notable for WBC 6.8. Hgb 11.9, Plt 181. INR 1.0. Na 142, K 3.8, CO2 20, Cr 1.59 (baseline), LFTs WNL. Ethanol < 10. COVID negative. CT Head with acute L MCA territory infarct affecting L insula/L frontal operculum, small chronic cortically-based infarcts of L frontal/L parietal/L occipital lobes. CTA Head/Neck with abrupt occlusion of proximal M2 L MCA vessel, ?2mm periophthalmic L ICA aneurysm, bilateral ICA plaque with progressive atherosclerotic plaque L carotid bifurcation with proximal L ICA severe, near-occlusive stenosis.   Taken to [facility] emergently for mechanical thrombectomy ([name]). TICI 3 revascularization achieved. L ICA stenosis and plaque rupture noted at the bulb, treated with stenting/angioplasty. Extubated post-procedure and maintained on cangrelor gtt with plan for transition to DAPT.  PCCM consulted for post-procedure medical management.  Pertinent Medical History:   Past Medical History:  Diagnosis Date   Anemia of chronic disease    Ankle fracture 02/15/2016   Cancer (HCC)    Prostate   Chronic constipation    Chronic kidney disease    stage III   Diabetes mellitus without complication (HCC)    type II    Diabetic peripheral neuropathy (HCC)    Failure to  thrive (0-17)    Fracture of left lower leg    Gout    Hyperlipidemia    Hypertension    Morbid obesity (HCC)    Unstable gait    Significant Hospital Events: Including procedures, antibiotic start and stop dates in addition to other pertinent events   2/5 - Presented to [facility] ED with poor responsiveness, aphasia, R-sided deficits. CT Head with L MCA territory infarct. CTA Head/Neck with occlusion of L MCA M2 segment and severe near-occlusive stenosis of L ICA. Taken to [facility] for MT, stent and angioplasty ([name]). PCCM consulted for post-procedure management. 2/6 transfer out of ICU 2/15 aspiration event early a.m. resulting in respiratory distress, transferred back to ICU and emergently intubated with development of severe shock requiring epi, levo, vaso. Transfused PRBC x1 for possible GI bleed 2/16: 3 pressors, weaning. Coox without cardiogenic component. Antibiotics vanc/cefepime. CT CAP with SBO  2/18: weaned off to only levo now which is likely sedation related  2/20: UOP drop overnight with ongoing poor renal function. Weaning sedation to evaluate neuro status.  2/22: started on trial of CRRT 2/23: HD catheter exchanged for longer one - flow improved. More awake by afternoon 2/24 tried to wean Precedex and pt got quite uncomfortable/agitated. Required Fentanyl infusion addition. Got hypotensive later in day, increase in NE  2/25 NE back down and almost off. Started pulling 50cc/hr from CRRT 2/26 remains on NE 11, CRRT ongoing 2/27: levo going up to 24, on vaso. CRRT net even. Made DNR today yesterday.  Received 2 units of PRBCs 3/1: CRRT turned off as blood pressure could not tolerate with increasing  pressor requirements 3/4 Repeat CT head with no acute changes but evolving left MCA infarcts with petechial hemorrhage and laminar necrosis  Interim History / Subjective:   Palliative held family meeting. Family wishes to continue current status for now  Urine output  continues to be poor and renal function worsens daily, now with hyperkalemia which was treated with lokelma, insulin, albuterol overnight This morning failed SBT tachypnea Remains on low dose vasopressor support  Objective:  Blood pressure (!) 110/51, pulse (!) 108, temperature 98.6 F (37 C), temperature source Axillary, resp. rate (!) 25, height 5\' 10"  (1.778 m), weight (!) 144.1 kg, SpO2 97%.    Vent Mode: PRVC FiO2 (%):  [40 %-50 %] 50 % Set Rate:  [22 bmp] 22 bmp Vt Set:  [580 mL] 580 mL PEEP:  [5 cmH20-8 cmH20] 5 cmH20 Plateau Pressure:  [18 cmH20-22 cmH20] 18 cmH20   Intake/Output Summary (Last 24 hours) at 07/03/2023 0920 Last data filed at 07/03/2023 0800 Gross per 24 hour  Intake 1661.25 ml  Output 55 ml  Net 1606.25 ml   Filed Weights   07/01/23 0500 07/02/23 0500 07/03/23 0500  Weight: (!) 139.8 kg (!) 141.2 kg (!) 144.1 kg   Physical Exam: General: Chronically ill-appearing, appears uncomfortable HENT: [place], AT, ETT in place Eyes: EOMI, no scleral icterus Respiratory: Diminished  to auscultation bilaterally.  No crackles, wheezing or rales Tachypneic Cardiovascular: RRR, -M/R/G, no JVD GI: BS+, soft, nontender Neuro: Sedated, pupils pinpoint and minimally reactive GU: External foley in place  Imaging, labs and test noted above have been reviewed independently by me. K 5.6 Na 129 CO2 18 Glucose 302 BUN/Cr 113/6.44  CXR 3/1 Bilateral pleural effusions with bibasilar atelectasis, interstitial edema improved compared to prior   Assessment & Plan:Resolved Hospital Problem List   Acute encephalopathy - multifactorial in setting of critical illness, metabolic derangements Left MCA M2 occlusion and left ICA near occlusion now status post thrombectomy with complete revascularization Acute hypoxemic respiratory failure due to aspiration Klebsiella pneumonia dx BAL 2/15- treated.  Ileus - resolved Heart failure with reduced ejection fraction ejection fraction of 30%  with global hypokinesis, normal right ventricular function Hypertension Hyperlipidemia Type 2 diabetes with hyperglycemia Acute kidney injury superimposed on chronic kidney disease stage IIIb - worsening Acute blood loss anemia, unclear etiology; may be stress ulcer related vs critical illness. No bloody BM, or OGT output.  Goals of care - poor prognosis for meaningful recovery and could be headed towards trach and possibly ESRD requiring HD Hypervolemic hyponatremia  Plan: Has been multiorgan system failure and unable to tolerate CRRT due to refractory shock. Not a candidate for long term iHD. No further dialysis per GOC and futility of treatment Start daily lokelma for hyperkalemia New neuro status change. STAT CT head ordered Complete Cefepime course today Not a candidate for weaning due to unstable critical condition Continue to wean pressors for goal MAP >65 Minimize sedation for RASS goal -1 Holding plavix for acute blood loss anemia. S/p PRBC x 1. F/u post-transfusion H/H  Goals of care Discussed with family, nephrology, palliative care. Will continue GOC discussions We have stopped CRRT as he is unable to tolerate it due to hemodynamic status with no plans to resume Wife expresses understanding and is considering withdrawal of care but do not want to transition him to comfort measures yet. Awaiting family to come together They confirm no CPR and defib but they want him to remain on the ventilator for now.  Updated Everette III in  room regarding multiorgan failure and progression of renal failure.  Best Practice (right click and "Reselect all SmartList Selections" daily)   Diet/type: tubefeeds and NPO DVT prophylaxis SCD Pressure ulcer(s): N/A GI prophylaxis: PPI Lines: Central line and yes and it is still needed Foley:  Yes, and it is still needed Code Status:  DNR Last date of multidisciplinary goals of care discussion: ongoing palliative discussion. DNR   The patient  is critically ill with multiple organ systems failure and requires high complexity decision making for assessment and support, frequent evaluation and titration of therapies, application of advanced monitoring technologies and extensive interpretation of multiple databases.  Independent Critical Care Time: 40 Minutes.   Mechele Collin, M.D. [facility] Pulmonary/Critical Care Medicine 07/03/2023 9:20 AM   Please see Amion for pager number to reach on-call Pulmonary and Critical Care Team.

## 2023-07-04 DIAGNOSIS — I639 Cerebral infarction, unspecified: Secondary | ICD-10-CM | POA: Diagnosis not present

## 2023-07-04 DIAGNOSIS — E1165 Type 2 diabetes mellitus with hyperglycemia: Secondary | ICD-10-CM | POA: Diagnosis not present

## 2023-07-04 DIAGNOSIS — G9340 Encephalopathy, unspecified: Secondary | ICD-10-CM | POA: Diagnosis not present

## 2023-07-04 DIAGNOSIS — N1832 Chronic kidney disease, stage 3b: Secondary | ICD-10-CM | POA: Diagnosis not present

## 2023-07-04 LAB — GLUCOSE, CAPILLARY
Glucose-Capillary: 333 mg/dL — ABNORMAL HIGH (ref 70–99)
Glucose-Capillary: 271 mg/dL — ABNORMAL HIGH (ref 70–99)
Glucose-Capillary: 262 mg/dL — ABNORMAL HIGH (ref 70–99)
Glucose-Capillary: 227 mg/dL — ABNORMAL HIGH (ref 70–99)
Glucose-Capillary: 232 mg/dL — ABNORMAL HIGH (ref 70–99)
Glucose-Capillary: 236 mg/dL — ABNORMAL HIGH (ref 70–99)

## 2023-07-04 LAB — POCT I-STAT 7, (LYTES, BLD GAS, ICA,H+H)
Acid-base deficit: 12 mmol/L — ABNORMAL HIGH (ref 0.0–2.0)
Bicarbonate: 16.5 mmol/L — ABNORMAL LOW (ref 20.0–28.0)
Calcium, Ion: 1.29 mmol/L (ref 1.15–1.40)
HCT: 22 % — ABNORMAL LOW (ref 39.0–52.0)
Hemoglobin: 7.5 g/dL — ABNORMAL LOW (ref 13.0–17.0)
O2 Saturation: 89 %
Patient temperature: 38
Potassium: 6.4 mmol/L (ref 3.5–5.1)
Sodium: 128 mmol/L — ABNORMAL LOW (ref 135–145)
TCO2: 18 mmol/L — ABNORMAL LOW (ref 22–32)
pCO2 arterial: 53 mmHg — ABNORMAL HIGH (ref 32–48)
pH, Arterial: 7.108 — CL (ref 7.35–7.45)
pO2, Arterial: 79 mmHg — ABNORMAL LOW (ref 83–108)

## 2023-07-04 LAB — BASIC METABOLIC PANEL
Anion gap: 7 (ref 5–15)
BUN: 131 mg/dL — ABNORMAL HIGH (ref 8–23)
CO2: 17 mmol/L — ABNORMAL LOW (ref 22–32)
Calcium: 8.3 mg/dL — ABNORMAL LOW (ref 8.9–10.3)
Chloride: 102 mmol/L (ref 98–111)
Creatinine, Ser: 7.52 mg/dL — ABNORMAL HIGH (ref 0.61–1.24)
GFR, Estimated: 7 mL/min — ABNORMAL LOW (ref >60)
Glucose, Bld: 329 mg/dL — ABNORMAL HIGH (ref 70–99)
Potassium: 6.6 mmol/L (ref 3.5–5.1)
Sodium: 126 mmol/L — ABNORMAL LOW (ref 135–145)

## 2023-07-04 LAB — CBC
HCT: 23.2 % — ABNORMAL LOW (ref 39.0–52.0)
Hemoglobin: 7 g/dL — ABNORMAL LOW (ref 13.0–17.0)
MCH: 30 pg (ref 26.0–34.0)
MCHC: 30.2 g/dL (ref 30.0–36.0)
MCV: 99.6 fL (ref 80.0–100.0)
Platelets: 190 10*3/uL (ref 150–400)
RBC: 2.33 MIL/uL — ABNORMAL LOW (ref 4.22–5.81)
RDW: 16.6 % — ABNORMAL HIGH (ref 11.5–15.5)
WBC: 12.5 10*3/uL — ABNORMAL HIGH (ref 4.0–10.5)
nRBC: 0.2 % (ref 0.0–0.2)

## 2023-07-04 MED ORDER — INSULIN ASPART 100 UNIT/ML IV SOLN
5.0000 [IU] | Freq: Once | INTRAVENOUS | Status: AC
  Administered 2023-07-04: 5 [IU] via INTRAVENOUS

## 2023-07-04 MED ORDER — SODIUM BICARBONATE 8.4 % IV SOLN
50.0000 meq | Freq: Once | INTRAVENOUS | Status: AC
  Administered 2023-07-04: 50 meq via INTRAVENOUS
  Filled 2023-07-04: qty 50

## 2023-07-04 MED ORDER — INSULIN GLARGINE 100 UNIT/ML ~~LOC~~ SOLN
25.0000 [IU] | Freq: Every day | SUBCUTANEOUS | Status: DC
  Administered 2023-07-04 – 2023-07-05 (×2): 25 [IU] via SUBCUTANEOUS
  Filled 2023-07-04 (×2): qty 0.25

## 2023-07-04 MED ORDER — ALBUTEROL SULFATE (2.5 MG/3ML) 0.083% IN NEBU
10.0000 mg | INHALATION_SOLUTION | Freq: Once | RESPIRATORY_TRACT | Status: AC
  Administered 2023-07-04: 10 mg via RESPIRATORY_TRACT
  Filled 2023-07-04: qty 12

## 2023-07-04 MED ORDER — VASOPRESSIN 20 UNITS/100 ML INFUSION FOR SHOCK
0.0000 [IU]/min | INTRAVENOUS | Status: DC
  Administered 2023-07-04 – 2023-07-05 (×3): 0.03 [IU]/min via INTRAVENOUS
  Filled 2023-07-04 (×3): qty 100

## 2023-07-04 MED ORDER — CALCIUM GLUCONATE-NACL 1-0.675 GM/50ML-% IV SOLN
1.0000 g | Freq: Once | INTRAVENOUS | Status: AC
  Administered 2023-07-04: 1000 mg via INTRAVENOUS
  Filled 2023-07-04: qty 50

## 2023-07-04 MED ORDER — INSULIN GLARGINE 100 UNIT/ML ~~LOC~~ SOLN
20.0000 [IU] | Freq: Every day | SUBCUTANEOUS | Status: DC
  Filled 2023-07-04: qty 0.2

## 2023-07-04 MED ORDER — INSULIN ASPART 100 UNIT/ML IJ SOLN
10.0000 [IU] | INTRAMUSCULAR | Status: DC
  Administered 2023-07-04 – 2023-07-05 (×5): 10 [IU] via SUBCUTANEOUS

## 2023-07-04 NOTE — Progress Notes (Signed)
 MD notified of critical ABG results. Mo changes at this time per MD.

## 2023-07-04 NOTE — Progress Notes (Deleted)
 Patient found bleeding from IR groin site. Pressure held for 20 minutes, quick clot applied, site retaped. MD made aware.   Meryl Dare, RN

## 2023-07-04 NOTE — Progress Notes (Signed)
 Palliative:  HPI: 83 y.o. male with past medical history of HTN, HLD, diabetes, CKD stage 3b, prior CVA with no residual deficits, prostate cancer, failure to thrive admitted on 06/05/2023 with unresponsiveness with right-sided deficits found to have left MCA infarct with left M2 occlusion and left ICA near occlusion s/p IR with mechanical thrombectomy and stenting 2/5. 2/15 aspiration event resulting in severe shock requiring intubation. Hospitalization also complicated by acute GIB and CHF EF 30%.      I discussed with RN and Dr. Marisue Humble. I discussed over the phone with daughter, Ines Bloomer, along with Dr. Marisue Humble. Ines Bloomer has good understanding of Mr. Newmark condition and poor prognosis. Ines Bloomer expresses that there were questions that came up about increased urine output and her mother needs to hear from nephrologist what this means and needs to know that this is not a sign of impending renal improvement prior to moving forward with extubation. Dr. Marisue Humble will try and speak with wife who is currently at bedside.   Shawn and I further discussed fluid overload and family understanding of his complicated condition. I shared that I had a long conversation with Mr. Minerd' son, Rylyn III/Butch, yesterday. They have a large family and many family members have different views of what should happen and path forward which makes this even more difficult for Mrs. Hadden to make decisions.   I met today with Dr. Marisue Humble along with wife and joined by son, Brenon III. Explanations and questions answered. Family were able to understand that minimal urine output is not enough to be any indication of kidney improvement. No plans for further dialysis. We further discussed goals of care and path forward. Wife endorses understanding that he is at end of life. She confirms DNR status. Her request is to continue current measures and allow him to die on ventilator. She does not want to make the decision to extubate.   All  questions/concerns addressed to the best of my ability. Emotional support provided. Discussed with RN and Dr. Everardo All.   Exam: Ill-appearing. Sedated on vent - not following commands. FiO2 60%. Generalized edema. Increasing vasopressor needs.   Plan: - DNR - Wife wishes to avoid having to extubate and allow death on ventilator - Wife wishes to continue current level of care  80 min  Yong Channel, NP Palliative Medicine Team Pager [phone] (Please see [email or web address] for schedule) Team Phone [phone]

## 2023-07-04 NOTE — Progress Notes (Signed)
 eLink Physician-Brief Progress Note Patient Name: Nathan Proctor. DOB: 04-13-1941 MRN: [id number]   Date of Service  07/04/2023  HPI/Events of Note  Mr Matlack condition is deteriorating.  Systolic BP in the 70's.  On max dose levophed and vasopressin.  Prognosis is poor.  Wife informed of declining status and she is coming to hospital.  eICU Interventions  Informed wife of declining status.  Family on way to hospital.     Intervention Category Major Interventions: End of life / care limitation discussion  Deanna Artis 07/04/2023, 11:37 PM

## 2023-07-04 NOTE — Progress Notes (Signed)
 NAME:  Nathan Proctor., MRN:  [id number], DOB:  07/29/1940, LOS: 29 ADMISSION DATE:  06/05/2023 CONSULTATION DATE:  06/05/2023 REFERRING MD:  Otelia Limes - Neuro, CHIEF COMPLAINT: Code Stroke, post-NIR   History of Present Illness:  83 year old man who presented to [facility] ED 2/5 via EMS for unresponsiveness and R-sided deficits. PMHx significant for HTN, HLD, prior CVA without residual deficits, T2DM, CKD stage IIIb, prostate CA, gout.  Patient presented to [facility] ED with significant R-sided deficits, aphasia and poor responsiveness. LKW 2200. Code Stroke initiated. On ED arrival, patient was afebrile with HR 86, BP 154/67, RR 20, SpO2 100%. Noted aphasia, R-sided hemiplegia, R-sided facial droop. Labs were notable for WBC 6.8. Hgb 11.9, Plt 181. INR 1.0. Na 142, K 3.8, CO2 20, Cr 1.59 (baseline), LFTs WNL. Ethanol < 10. COVID negative. CT Head with acute L MCA territory infarct affecting L insula/L frontal operculum, small chronic cortically-based infarcts of L frontal/L parietal/L occipital lobes. CTA Head/Neck with abrupt occlusion of proximal M2 L MCA vessel, ?2mm periophthalmic L ICA aneurysm, bilateral ICA plaque with progressive atherosclerotic plaque L carotid bifurcation with proximal L ICA severe, near-occlusive stenosis.   Taken to [facility] emergently for mechanical thrombectomy ([name]). TICI 3 revascularization achieved. L ICA stenosis and plaque rupture noted at the bulb, treated with stenting/angioplasty. Extubated post-procedure and maintained on cangrelor gtt with plan for transition to DAPT.  PCCM consulted for post-procedure medical management.  Pertinent Medical History:   Past Medical History:  Diagnosis Date   Anemia of chronic disease    Ankle fracture 02/15/2016   Cancer (HCC)    Prostate   Chronic constipation    Chronic kidney disease    stage III   Diabetes mellitus without complication (HCC)    type II    Diabetic peripheral neuropathy (HCC)    Failure to  thrive (0-17)    Fracture of left lower leg    Gout    Hyperlipidemia    Hypertension    Morbid obesity (HCC)    Unstable gait    Significant Hospital Events: Including procedures, antibiotic start and stop dates in addition to other pertinent events   2/5 - Presented to [facility] ED with poor responsiveness, aphasia, R-sided deficits. CT Head with L MCA territory infarct. CTA Head/Neck with occlusion of L MCA M2 segment and severe near-occlusive stenosis of L ICA. Taken to [facility] for MT, stent and angioplasty ([name]). PCCM consulted for post-procedure management. 2/6 transfer out of ICU 2/15 aspiration event early a.m. resulting in respiratory distress, transferred back to ICU and emergently intubated with development of severe shock requiring epi, levo, vaso. Transfused PRBC x1 for possible GI bleed 2/16: 3 pressors, weaning. Coox without cardiogenic component. Antibiotics vanc/cefepime. CT CAP with SBO  2/18: weaned off to only levo now which is likely sedation related  2/20: UOP drop overnight with ongoing poor renal function. Weaning sedation to evaluate neuro status.  2/22: started on trial of CRRT 2/23: HD catheter exchanged for longer one - flow improved. More awake by afternoon 2/24 tried to wean Precedex and pt got quite uncomfortable/agitated. Required Fentanyl infusion addition. Got hypotensive later in day, increase in NE  2/25 NE back down and almost off. Started pulling 50cc/hr from CRRT 2/26 remains on NE 11, CRRT ongoing 2/27: levo going up to 24, on vaso. CRRT net even. Made DNR today yesterday.  Received 2 units of PRBCs 3/1: CRRT turned off as blood pressure could not tolerate with increasing  pressor requirements 3/4 Repeat CT head with no acute changes but evolving left MCA infarcts with petechial hemorrhage and laminar necrosis 3/6 Worsening vasopressor requirements. Renal function continues to progress  Interim History / Subjective:   Significantly increased  vasopressor requirement overnight. Now on levophed 25 Palliative having on going conversations with family  Renal function continues to progress with hyperkalemia and acidosis  Objective:  Blood pressure (!) 80/41, pulse (!) 107, temperature (!) 100.4 F (38 C), temperature source Axillary, resp. rate (!) 22, height 5\' 10"  (1.778 m), weight (!) 144 kg, SpO2 95%.    Vent Mode: PRVC FiO2 (%):  [50 %-100 %] 50 % Set Rate:  [22 bmp] 22 bmp Vt Set:  [580 mL] 580 mL PEEP:  [8 cmH20] 8 cmH20 Plateau Pressure:  [22 cmH20-25 cmH20] 22 cmH20   Intake/Output Summary (Last 24 hours) at 07/04/2023 0736 Last data filed at 07/04/2023 0600 Gross per 24 hour  Intake 1867.96 ml  Output 50 ml  Net 1817.96 ml   Filed Weights   07/02/23 0500 07/03/23 0500 07/04/23 0500  Weight: (!) 141.2 kg (!) 144.1 kg (!) 144 kg   Physical Exam: General: Critically ill-appearing, no acute distress HENT: [place], AT, ETT in place Eyes: EOMI, no scleral icterus Respiratory: Diminished to auscultation bilaterally.  No crackles, wheezing or rales Cardiovascular: Tachycardic, RR, -M/R/G, no JVD GI: BS+, soft, nontender Extremities: Anasarca, -Edema,-tenderness Neuro: Sedated, pupils minimal and non-reactive GU: External foley in place  Imaging, labs and test noted above have been reviewed independently by me. K 6.6 CO2 17 BUN/Cr 131/7.52  CXR 3/1 Bilateral pleural effusions with bibasilar atelectasis, interstitial edema improved compared to prior   Assessment & Plan:Resolved Hospital Problem List   Acute encephalopathy - multifactorial in setting of critical illness, metabolic derangements Left MCA M2 occlusion and left ICA near occlusion now status post thrombectomy with complete revascularization Acute hypoxemic respiratory failure due to aspiration Klebsiella pneumonia dx BAL 2/15- treated.  Ileus - resolved Heart failure with reduced ejection fraction ejection fraction of 30% with global hypokinesis, normal  right ventricular function Hypertension Hyperlipidemia Type 2 diabetes with hyperglycemia Acute kidney injury superimposed on chronic kidney disease stage IIIb - worsening Acute blood loss anemia, unclear etiology; may be stress ulcer related vs critical illness. No bloody BM, or OGT output.  Goals of care - poor prognosis for meaningful recovery and could be headed towards trach and possibly ESRD requiring HD Hypervolemic hyponatremia  Plan: In [facility] with worsening renal failure and worsening shock. Unable to tolerate CRRT due to refractory shock. Not a candidate for long term iHD. No further dialysis per GOC and futility of treatment Insulin, albuterol, sodium bicarb ordered for hyperkalemia On lokelma daily for hyperkalemia New neuro status change. STAT CT head ordered Complete Cefepime course today Not a candidate for weaning due to unstable critical condition Continue to wean pressors for goal MAP >65. Vasopressin added to levophed Minimize sedation for RASS goal -1 Holding plavix for acute blood loss anemia. S/p PRBC x 1. F/u post-transfusion H/H  Goals of care Discussed with family, nephrology, palliative care. Will continue GOC discussions We have stopped CRRT as he is unable to tolerate it due to hemodynamic status with no plans to resume Wife expresses understanding and is considering withdrawal of care but do not want to transition him to comfort measures yet. Awaiting family to come together They confirm no CPR and defib but they want him to remain on the ventilator for now.  Updated Everette III  in room regarding multiorgan failure and progression of renal failure 3/6.  Best Practice (right click and "Reselect all SmartList Selections" daily)   Diet/type: tubefeeds and NPO DVT prophylaxis SCD Pressure ulcer(s): N/A GI prophylaxis: PPI Lines: Central line and yes and it is still needed Foley:  Yes, and it is still needed Code Status:  DNR Last date of multidisciplinary  goals of care discussion: ongoing palliative discussion. DNR  The patient is critically ill with multiple organ systems failure and requires high complexity decision making for assessment and support, frequent evaluation and titration of therapies, application of advanced monitoring technologies and extensive interpretation of multiple databases.  Independent Critical Care Time: 45 Minutes.   Mechele Collin, M.D. [facility] Pulmonary/Critical Care Medicine 07/04/2023 7:36 AM   Please see Amion for pager number to reach on-call Pulmonary and Critical Care Team.

## 2023-07-04 NOTE — Progress Notes (Signed)
 eLink Physician-Brief Progress Note Patient Name: Nathan Proctor. DOB: 1941-04-15 MRN: [id number]   Date of Service  07/04/2023  HPI/Events of Note  Potassium 6.6  eICU Interventions  Patient to receive lokelma this am     Intervention Category Intermediate Interventions: Electrolyte abnormality - evaluation and management  Henry Russel, P 07/04/2023, 6:05 AM

## 2023-07-05 DIAGNOSIS — N1832 Chronic kidney disease, stage 3b: Secondary | ICD-10-CM | POA: Diagnosis not present

## 2023-07-05 DIAGNOSIS — J9601 Acute respiratory failure with hypoxia: Secondary | ICD-10-CM | POA: Diagnosis not present

## 2023-07-05 DIAGNOSIS — I639 Cerebral infarction, unspecified: Secondary | ICD-10-CM | POA: Diagnosis not present

## 2023-07-05 LAB — CBC
HCT: 24.1 % — ABNORMAL LOW (ref 39.0–52.0)
Hemoglobin: 7.2 g/dL — ABNORMAL LOW (ref 13.0–17.0)
MCH: 30 pg (ref 26.0–34.0)
MCHC: 29.9 g/dL — ABNORMAL LOW (ref 30.0–36.0)
MCV: 100.4 fL — ABNORMAL HIGH (ref 80.0–100.0)
Platelets: 230 10*3/uL (ref 150–400)
RBC: 2.4 MIL/uL — ABNORMAL LOW (ref 4.22–5.81)
RDW: 17 % — ABNORMAL HIGH (ref 11.5–15.5)
WBC: 20.6 10*3/uL — ABNORMAL HIGH (ref 4.0–10.5)
nRBC: 0.8 % — ABNORMAL HIGH (ref 0.0–0.2)

## 2023-07-05 LAB — BASIC METABOLIC PANEL
Anion gap: 15 (ref 5–15)
BUN: 156 mg/dL — ABNORMAL HIGH (ref 8–23)
CO2: 15 mmol/L — ABNORMAL LOW (ref 22–32)
Calcium: 9 mg/dL (ref 8.9–10.3)
Chloride: 99 mmol/L (ref 98–111)
Creatinine, Ser: 8.55 mg/dL — ABNORMAL HIGH (ref 0.61–1.24)
GFR, Estimated: 6 mL/min — ABNORMAL LOW (ref >60)
Glucose, Bld: 184 mg/dL — ABNORMAL HIGH (ref 70–99)
Potassium: 6.9 mmol/L (ref 3.5–5.1)
Sodium: 129 mmol/L — ABNORMAL LOW (ref 135–145)

## 2023-07-05 LAB — GLUCOSE, CAPILLARY
Glucose-Capillary: 206 mg/dL — ABNORMAL HIGH (ref 70–99)
Glucose-Capillary: 195 mg/dL — ABNORMAL HIGH (ref 70–99)
Glucose-Capillary: 96 mg/dL (ref 70–99)

## 2023-07-05 MED ORDER — SODIUM BICARBONATE 8.4 % IV SOLN
100.0000 meq | Freq: Once | INTRAVENOUS | Status: AC
  Administered 2023-07-05: 100 meq via INTRAVENOUS
  Filled 2023-07-05: qty 50

## 2023-07-05 MED ORDER — SODIUM BICARBONATE 8.4 % IV SOLN
INTRAVENOUS | Status: AC
  Filled 2023-07-05: qty 50

## 2023-07-05 MED ORDER — INSULIN ASPART 100 UNIT/ML IV SOLN
10.0000 [IU] | Freq: Once | INTRAVENOUS | Status: AC
  Administered 2023-07-05: 10 [IU] via INTRAVENOUS

## 2023-07-05 MED ORDER — CALCIUM GLUCONATE-NACL 1-0.675 GM/50ML-% IV SOLN
1.0000 g | Freq: Once | INTRAVENOUS | Status: AC
  Administered 2023-07-05: 1000 mg via INTRAVENOUS
  Filled 2023-07-05: qty 50

## 2023-07-05 MED ORDER — DEXTROSE 50 % IV SOLN
1.0000 | Freq: Once | INTRAVENOUS | Status: AC
  Administered 2023-07-05: 50 mL via INTRAVENOUS
  Filled 2023-07-05: qty 50

## 2023-07-05 MED ORDER — SODIUM ZIRCONIUM CYCLOSILICATE 10 G PO PACK
10.0000 g | PACK | Freq: Two times a day (BID) | ORAL | Status: DC
  Administered 2023-07-05: 10 g
  Filled 2023-07-05: qty 1

## 2023-07-05 MED ORDER — SODIUM ZIRCONIUM CYCLOSILICATE 10 G PO PACK: 10.0000 g | PACK | Freq: Two times a day (BID) | ORAL | Status: DC

## 2023-07-08 LAB — GLUCOSE, CAPILLARY: Glucose-Capillary: <10 mg/dL — CL (ref 70–99)

## 2023-07-18 ENCOUNTER — Ambulatory Visit: Payer: No Typology Code available for payment source | Admitting: Nurse Practitioner

## 2023-07-30 NOTE — Progress Notes (Signed)
 PCCM Progress Note  This AM I spoke daughter regarding blood pressures with SBP at the time 70s-80s. Wife was called on speakerphone and I updated her on her husband's deteriorating condition despite max medical therapy. Wife reports that she expresses understanding and wishes for [place] to continue current care and understands that he has the potential to pass away prior to her arrival. She has a doctors appointment and plans to come to the hospital after that.  This PM SBP now in the 50s. Daughter at bedside and wife pending arrival. Will continue to medically support with current measures.

## 2023-07-30 NOTE — Progress Notes (Signed)
 eLink Physician-Brief Progress Note Patient Name: Nathan Proctor. DOB: Oct 31, 1940 MRN: [id number]   Date of Service  06/29/2023  HPI/Events of Note  Potassium 6.9 Creat 8.55  eICU Interventions  Lokelma Calcium D50/insulin ordered     Intervention Category Intermediate Interventions: Electrolyte abnormality - evaluation and management  Henry Russel, P 07/19/2023, 6:26 AM

## 2023-07-30 NOTE — Progress Notes (Signed)
   07/06/2023 1104  Spiritual Encounters  Type of Visit Follow up  Care provided to: Family  Reason for visit Routine spiritual support  OnCall Visit No   Visited with patient's spouse. Offered support via spiritual care. Spouse received phone call from relatives and asked chaplain to come back later. Will follow-up.

## 2023-07-30 NOTE — TOC Progression Note (Signed)
 Transition of Care (TOC) - Progression Note    Patient Details  Name: Nathan Proctor. MRN: [id number] Date of Birth: May 07, 1940  Transition of Care [facility]) CM/SW Contact  Mearl Latin, LCSW Phone Number: 07/22/2023, 10:07 AM  Clinical Narrative:    TOC continuing to follow.   Expected Discharge Plan: Skilled Nursing Facility Barriers to Discharge: Continued Medical Work up, English as a second language teacher  Expected Discharge Plan and Services In-house Referral: Clinical Social Work   Post Acute Care Choice: Skilled Nursing Facility Living arrangements for the past 2 months: Single Family Home                                       Social Determinants of Health (SDOH) Interventions SDOH Screenings   Food Insecurity: Patient Unable To Answer (06/07/2023)  Housing: Patient Unable To Answer (06/07/2023)  Transportation Needs: Patient Unable To Answer (06/07/2023)  Utilities: Patient Unable To Answer (06/07/2023)  Depression (PHQ2-9): Low Risk  (12/03/2019)  Social Connections: Patient Unable To Answer (06/07/2023)  Tobacco Use: Medium Risk (06/13/2023)    Readmission Risk Interventions    06/26/2023    5:05 PM  Readmission Risk Prevention Plan  Transportation Screening Complete  Medication Review (RN Care Manager) Complete  PCP or Specialist appointment within 3-5 days of discharge Complete  HRI or Home Care Consult Complete  SW Recovery Care/Counseling Consult Complete  Palliative Care Screening Complete  Skilled Nursing Facility Complete

## 2023-07-30 NOTE — Death Summary Note (Signed)
 DEATH SUMMARY   Patient Details  Name: Nathan Proctor. MRN: [id number] DOB: Jul 15, 1940  Admission/Discharge Information   Admit Date:  2023-06-25  Date of Death: Date of Death: 07/25/23  Time of Death: Time of Death: August 04, 1323  Length of Stay: 30  Referring Physician: Fleet Contras, MD   Reason(s) for Hospitalization  Acute MCA stroke  Diagnoses  Preliminary cause of death:  Secondary Diagnoses (including complications and co-morbidities):  Principal Problem:   Acute ischemic stroke [facility]) Active Problems:   Primary hypertension   Malnutrition of moderate degree   Acute systolic heart failure (HCC)   AKI (acute kidney injury) (HCC)   Acute on chronic combined systolic and diastolic CHF (congestive heart failure) [facility])   Brief Hospital Course (including significant findings, care, treatment, and services provided and events leading to death)  Nathan Proctor. is a 83 y.o. year old male with HTN, HLD, prior CVA without residual deficits, T2DM, CKD stage IIIb, prostate CA, gout initially admitted for L MCA stroke s/p emergent thrombectomy and stenting angioplasty of L ICS stenosis/plaque rupture. Returned to the ICU on 2/15 after aspiration in setting of SBO resulting in septic shock. Hospital course also complicated by renal failure requiring dialysis and cardiogenic shock. Multiple GOC discussed with primary team and palliative regarding chronic co-morbidities and clinical course. Despite dialysis, renal function did not demonstrate recovery and with his co-morbidities he was determined not to be a candidate for long term dialysis. Dialysis was discontinued once he was unable to tolerate due to increasing vasopressor requirements. Patient was transitioned to DNR on 2/27. He continued to decline despite aggressive medical management and expired on July 25, 2023 at 1325.  Pertinent Labs and Studies  Significant Diagnostic Studies CT HEAD WO CONTRAST ( ) Result Date:  07/02/2023 CLINICAL DATA:  83 year old male status post code stroke presentation last month with left MCA branch occlusion, reperfusion, petechial hemorrhage. EXAM: CT HEAD WITHOUT CONTRAST TECHNIQUE: Contiguous axial images were obtained from the base of the skull through the vertex without intravenous contrast. RADIATION DOSE REDUCTION: This exam was performed according to the departmental dose-optimization program which includes automated exposure control, adjustment of the mA and/or kV according to patient size and/or use of iterative reconstruction technique. COMPARISON:  Head CTs 06/07/2023 and earlier. FINDINGS: Brain: Left MCA infarct involving the operculum and anterior division territory with heterogeneous combined developing encephalomalacia and petechial hemorrhage/laminar necrosis. Mass effect on the left lateral ventricle has regressed, no significant midline shift. Basilar cisterns remain patent. No ventriculomegaly. Stable gray-white differentiation elsewhere, including small chronic cerebellar infarcts. Vascular: Calcified atherosclerosis at the skull base. No suspicious intracranial vascular hyperdensity. Skull: Intact, stable. Sinuses/Orbits: Progressive middle ear and mastoid opacification since last month. Left nasoenteric tube remains in place. Relatively stable paranasal sinus aeration. Other: No acute orbit or scalp soft tissue finding. IMPRESSION: 1. Left MCA territory developing encephalomalacia with superimposed petechial hemorrhage and/or laminar necrosis. 2. Regression of intracranial mass effect. No malignant hemorrhagic transformation or new intracranial abnormality. 3. Progressed middle ear and mastoid opacification since last month. Left nasoenteric tube remains in place. Electronically Signed   By: Odessa Fleming M.D.   On: 07/02/2023 09:57   DG CHEST PORT 1 VIEW Result Date: 06/29/2023 CLINICAL DATA:  Hypoxemia. EXAM: PORTABLE CHEST 1 VIEW COMPARISON:  06/27/2023 FINDINGS: ET tube tip is  above the carina. There is a right IJ catheter with tip at the distal SVC. Feeding tube courses below the level of the field of view. Stable cardiomediastinal contours. Persistent bilateral  pleural effusions with bibasilar atelectasis. Persistent pulmonary vascular congestion. Interstitial edema is decreased from previous exam. IMPRESSION: 1. Persistent bilateral pleural effusions with bibasilar atelectasis. 2. Decreased interstitial edema. Electronically Signed   By: Signa Kell M.D.   On: 06/29/2023 10:30   DG CHEST PORT 1 VIEW Result Date: 06/27/2023 CLINICAL DATA:  Tachypnea. EXAM: PORTABLE CHEST 1 VIEW COMPARISON:  June 24, 2023. FINDINGS: Stable cardiomediastinal silhouette is noted with interval development of central pulmonary vascular congestion and probable bilateral pulmonary edema. Bibasilar atelectasis is noted with probable pleural effusions, right greater than left. Endotracheal and feeding tubes are in grossly good position. Bilateral internal jugular catheters are unchanged. IMPRESSION: Interval development of central pulmonary vascular congestion and probable bilateral pulmonary edema with associated bibasilar atelectasis and pleural effusions, right greater than left. Electronically Signed   By: Lupita Raider M.D.   On: 06/27/2023 11:36   DG CHEST PORT 1 VIEW Result Date: 06/25/2023 CLINICAL DATA:  ETT placement EXAM: PORTABLE CHEST 1 VIEW COMPARISON:  06/24/2023 FINDINGS: Endotracheal tube terminates 7 cm above the carina. Patchy bilateral opacities, favoring bibasilar atelectasis. Small left pleural effusion. No pneumothorax. The heart is normal in size. Right IJ venous catheter terminates at the cavoatrial junction. Left IJ venous catheter terminates in the upper SVC. Enteric tube courses into the stomach. IMPRESSION: Endotracheal tube terminates 7 cm above the carina. Patchy bilateral opacities, favoring bibasilar atelectasis. Small left pleural effusion. Electronically Signed    By: Charline Bills M.D.   On: 06/25/2023 02:23   DG Chest Port 1 View Result Date: 06/24/2023 CLINICAL DATA:  Intubated EXAM: PORTABLE CHEST 1 VIEW COMPARISON:  06/24/2023 FINDINGS: Single frontal view of the chest image straits endotracheal tube overlying tracheal air column, tip at the level of the thoracic inlet approximately 8.5 cm above carina. Enteric catheter passes below diaphragm tip excluded by collimation. Bilateral internal jugular catheters are unchanged, distal margins projecting over the superior vena cava. Cardiac silhouette is stable. Stable bibasilar consolidation and bilateral pleural effusions. No pneumothorax. No acute bony abnormalities. IMPRESSION: 1. Endotracheal tube tip at the thoracic inlet, approximately 8.5 cm above carina. 2. Other support devices as above. 3. Persistent bibasilar consolidation and effusions. Electronically Signed   By: Sharlet Salina M.D.   On: 06/24/2023 22:43   DG CHEST PORT 1 VIEW Result Date: 06/23/2023 CLINICAL DATA:  Central line placement EXAM: PORTABLE CHEST 1 VIEW COMPARISON:  06/22/2023 FINDINGS: RIGHT central venous line with tip in distal SVC. LEFT central venous line with tip in the mid SVC. Endotracheal tube and feeding tube unchanged. No pneumothorax. Bilateral small effusions. Low lung volumes. IMPRESSION: 1. Bilateral central venous lines without complication. 2. Low lung volumes and small effusions. Electronically Signed   By: Genevive Bi M.D.   On: 06/23/2023 10:10   DG Chest Port 1 View Result Date: 06/22/2023 CLINICAL DATA:  Central line placement. EXAM: PORTABLE CHEST 1 VIEW COMPARISON:  June 19, 2023. FINDINGS: Stable cardiomediastinal silhouette. Hypoinflation of the lungs is noted with bibasilar subsegmental atelectasis and probable small pleural effusions. Endotracheal tube and feeding tube are unchanged. Left internal jugular catheter is unchanged. Interval placement of right internal jugular catheter with distal tip in  expected position of the SVC. No pneumothorax. IMPRESSION: Interval placement of right internal jugular catheter with distal tip in expected position of the SVC. Electronically Signed   By: Lupita Raider M.D.   On: 06/22/2023 11:36   DG CHEST PORT 1 VIEW Result Date: 06/19/2023 CLINICAL DATA:  Respiratory  distress. EXAM: PORTABLE CHEST 1 VIEW COMPARISON:  Radiograph 06/15/2023, CT 06/16/2023 FINDINGS: Tip of the endotracheal tube 4.5 cm from the carina. Enteric tube and left central line unchanged in positioning. Slight improved right lung aeration with persistent basilar opacity. Slight worsening patchy opacity at the left lung base. No pneumothorax or large pleural effusion. No pulmonary edema. Stable heart size and mediastinal contours. IMPRESSION: 1. Bibasilar opacities, improving on the right but worsening on the left. 2. Stable support apparatus. Electronically Signed   By: Narda Rutherford M.D.   On: 06/19/2023 16:02   DG Abd Portable 1V Result Date: 06/18/2023 CLINICAL DATA:  Orogastric tube placement. EXAM: PORTABLE ABDOMEN - 1 VIEW COMPARISON:  Radiographs 06/05/2023.  Abdominal CT 06/16/2023. FINDINGS: 1236 hours. Tip of the enteric tube projects over the right upper quadrant of the abdomen, likely in the distal stomach. There is a small amount contrast material within the stomach. The visualized bowel gas pattern is normal. Patient is rotated to the right with persistent right basilar airspace disease and a small right pleural effusion. IMPRESSION: Enteric tube tip projects over the distal stomach. Electronically Signed   By: Carey Bullocks M.D.   On: 06/18/2023 14:48   CT CHEST ABDOMEN PELVIS WO CONTRAST Result Date: 06/16/2023 CLINICAL DATA:  Septicemia.  Pneumonia. EXAM: CT CHEST, ABDOMEN AND PELVIS WITHOUT CONTRAST TECHNIQUE: Multidetector CT imaging of the chest, abdomen and pelvis was performed following the standard protocol without IV contrast. RADIATION DOSE REDUCTION: This exam was  performed according to the departmental dose-optimization program which includes automated exposure control, adjustment of the mA and/or kV according to patient size and/or use of iterative reconstruction technique. COMPARISON:  CT angio chest from 04/04/2016. FINDINGS: CT CHEST FINDINGS Cardiovascular: Heart size is normal. Aortic atherosclerosis and multi vessel coronary artery calcifications. No pericardial effusion. Mediastinum/Nodes: Thyroid gland, trachea and esophagus appear normal. ET tube is in place with tip above the carina. There is a enteric tube with tip in the stomach.No enlarged mediastinal lymph nodes. Hilar lymph nodes are suboptimally evaluated due to lack of IV contrast. Lungs/Pleura: No pleural effusion or interstitial edema. Bilateral lower lobe airspace consolidation is identified, right greater than left. Imaging findings compatible with multifocal pneumonia. Chronic subsegmental atelectasis this is within the right middle lobe and right lower lobe with asymmetric volume loss and elevation of the right hemidiaphragm. Musculoskeletal: No chest wall mass or suspicious bone lesions identified. CT ABDOMEN PELVIS FINDINGS Hepatobiliary: No focal liver abnormality. Gallbladder appears normal. No signs of bile duct dilatation. Pancreas: Unremarkable. No pancreatic ductal dilatation or surrounding inflammatory changes. Spleen: Normal in size without focal abnormality. Adrenals/Urinary Tract: Normal adrenal glands. Multiple stones identified within the upper pole of the left kidney which measure up to 7 mm. Bilateral exophytic kidney lesions of varying complexity are identified. These are suboptimally assessed on the current exam reflecting lack of IV contrast material. No signs of hydronephrosis. Urinary bladder is decompressed around a Foley catheter. Stomach/Bowel: Enteric tube tip is in the proximal stomach. There is retained enteric contrast material within the gastric fundus. The appendix is  visualized and appears normal. There are several dilated loops of small bowel within the right hemiabdomen. The proximal small bowel loops are normal in caliber. Within the right upper quadrant of the abdomen there are multiple dilated loops of small bowel measuring up to 3.5 cm containing air-fluid levels. The bowel loops proximal and distal are normal in caliber. Within the limitations of unenhanced technique there is no significant bowel wall thickening.  Distal colonic diverticulosis identified without signs of acute diverticulitis. Retained enteric contrast material identified within the colon up to the rectum Vascular/Lymphatic: Aortic atherosclerosis without aneurysm. No signs of abdominopelvic adenopathy. Reproductive: Prostate gland is either atrophic or surgically absent. Penile prosthesis identified. Other: No free fluid or fluid collections. Musculoskeletal: No acute or significant osseous findings. IMPRESSION: 1. Bilateral lower lobe airspace consolidation, right greater than left. Imaging findings compatible with multifocal pneumonia. 2. Chronic subsegmental atelectasis within the right middle lobe and right lower lobe with asymmetric volume loss and elevation of the right hemidiaphragm. 3. Assessment of bowel pathology is limited due to lack of IV and enteric contrast material. There are multiple abnormally dilated loops of small bowel within the right hemiabdomen. Findings are compatible with a small-bowel obstruction. As there is normal caliber proximal small bowel and normal caliber distal small bowel findings are concerning for either close loop obstruction or internal hernia. 4. Distal colonic diverticulosis without signs of acute diverticulitis. 5. Nonobstructing left renal calculi. 6. Bilateral exophytic kidney lesions of varying complexity are identified. These are suboptimally assessed on the current exam reflecting lack of IV contrast material. Further evaluation with nonemergent renal  ultrasound is advised. 7.  Aortic Atherosclerosis (ICD10-I70.0). Electronically Signed   By: Signa Kell M.D.   On: 06/16/2023 15:51   ECHOCARDIOGRAM LIMITED Result Date: 06/15/2023    ECHOCARDIOGRAM LIMITED REPORT   Patient Name:   Nathan Proctor. Date of Exam: 06/15/2023 Medical Rec #:  [id number]              Height:       70.0 in Accession #:    [id number]             Weight:       263.9 lb Date of Birth:  06/25/1940              BSA:          2.348 m Patient Age:    83 years               BP:           121/53 mmHg Patient Gender: M                      HR:           90 bpm. Exam Location:  Inpatient Procedure: Limited Echo, Color Doppler and Intracardiac Opacification Agent            (Both Spectral and Color Flow Doppler were utilized during            procedure). Indications:    Stroke i63.9, Shock  History:        Patient has prior history of Echocardiogram examinations, most                 recent 06/05/2023. CHF; Risk Factors:Hypertension and                 Dyslipidemia.  Sonographer:    Irving Burton Senior RDCS Referring Phys: [phone] Camella Seim JANE Rafeal Skibicki  Sonographer Comments: Technically difficult due to body habitus, scanned supine on artificial repirator IMPRESSIONS  1. Very difficult to determine LVEF to limited visualization and frequent ectopy. . Left ventricular ejection fraction, by estimation, is 40 to 45%. The left ventricle has mildly decreased function. Left ventricular endocardial border not optimally defined to evaluate regional wall motion.  2. Right ventricular systolic function was not well visualized. The right ventricular size  is not well visualized.  3. The inferior vena cava is normal in size with <50% respiratory variability, suggesting right atrial pressure of 8 mmHg.  4. Limited echo FINDINGS  Left Ventricle: Very difficult to determine LVEF to limited visualization and frequent ectopy. Left ventricular ejection fraction, by estimation, is 40 to 45%. The left ventricle has mildly  decreased function. Left ventricular endocardial border not optimally defined to evaluate regional wall motion. Definity contrast agent was given IV to delineate the left ventricular endocardial borders. Right Ventricle: The right ventricular size is not well visualized. Right vetricular wall thickness was not well visualized. Right ventricular systolic function was not well visualized. Venous: The inferior vena cava is normal in size with less than 50% respiratory variability, suggesting right atrial pressure of 8 mmHg. Additional Comments: Color Doppler performed.  Dina Rich MD Electronically signed by Dina Rich MD Signature Date/Time: 06/15/2023/4:31:38 PM    Final    DG Abd 1 View Result Date: 06/15/2023 CLINICAL DATA:  Orogastric tube placement. EXAM: ABDOMEN - 1 VIEW COMPARISON:  Radiograph 06/08/2023 FINDINGS: Tip of the enteric tube below the diaphragm in the stomach, the side port is just beyond the gastroesophageal junction. There is gaseous gastric distension. Dilated bowel in the right abdomen up 4.7 cm, potentially small bowel, but incompletely included in the field of view. IMPRESSION: 1. Tip of the enteric tube below the diaphragm in the stomach, side-port just beyond the gastroesophageal junction. 2. Gaseous gastric distension. Additional dilated bowel in the right abdomen likely small bowel, although incompletely included in the field of view. Electronically Signed   By: Narda Rutherford M.D.   On: 06/15/2023 15:06   DG CHEST PORT 1 VIEW Result Date: 06/15/2023 CLINICAL DATA:  10031 Cough 10031 EXAM: PORTABLE CHEST 1 VIEW COMPARISON:  June 08, 2023 FINDINGS: The cardiomediastinal silhouette is unchanged in contour.Unchanged elevation of the RIGHT hemidiaphragm. No pleural effusion. No pneumothorax. Bibasilar platelike opacities. Atherosclerotic calcifications. Similar background of interstitial prominence. IMPRESSION: Bibasilar platelike opacities, favored to reflect atelectasis.  Electronically Signed   By: Meda Klinefelter M.D.   On: 06/15/2023 12:07   DG CHEST PORT 1 VIEW Result Date: 06/15/2023 CLINICAL DATA:  Respiratory failure, intubation and central line placement. EXAM: PORTABLE CHEST 1 VIEW COMPARISON:  Film earlier today at 0734 hours FINDINGS: Interval intubation with the endotracheal tube tip approximately 3.5 cm above the carina. Left jugular central line placement with the line tip in the upper SVC. No pneumothorax. Volume loss of the right lung remains with elevation of the right hemidiaphragm. Improved expansion of the left lung. Potential underlying mild pulmonary interstitial edema. No significant pleural effusions. IMPRESSION: 1. Interval intubation and left jugular central line placement. No pneumothorax. 2. Improved expansion of the left lung. Persistent volume loss of the right lung with elevation of the right hemidiaphragm. Potential underlying mild pulmonary interstitial edema. Electronically Signed   By: Irish Lack M.D.   On: 06/15/2023 10:42   DG Swallowing Func-Speech Pathology Result Date: 06/12/2023 Table formatting from the original result was not included. Modified Barium Swallow Study Patient Details Name: Nathan Proctor. MRN: [id number] Date of Birth: 1940/06/27 Today's Date: 06/12/2023 HPI/PMH: HPI: Pt is a 83 y.o. male who was admitted as a code stroke on 02/05 due to acute onset of right-sided weakness, right facial droop and aphasia. MRI from 02/06 displayed an evolving acute left MCA infarct, most pronounced at the left insula and left frontal operculum. Pt has intentionally lost 300 pounds in the recent  past, with pre-loss weight at ~600Ib. PMHx includes hypertension, diabetes, CKD, prior CVA. Clinical Impression: Clinical Impression: Pt presents with a mild-moderate oropharyngeal dysphagia (DIGEST Score: 1) primarily characterized by silent penetration of thin liquid at level of VFs, flash penetration of nectar-thick above VFs, and  diffuse pharyngeal residue. Pt's dysphagia severity level dropped from DIGEST score of 3 to 1, compared to previous MBS eval on 02/07.     Pharyngeal residue was present throughout all consistencies. Pt's posture for swallowing was consistently neutral. Pt follows commands for compensatory strategies more readily now with max multimodal cueing. Effective compensatory strategies include multiple swallows, throat clearing, and a straw when used with a verbally cued slow rate. Multiple consecutive sips of thin-liquid presented the most risk for aspiration due to silent penetration of Vfs. Unlike previous MBS study, pt tolerated solid consistency, displaying prolonged mastication but majority clearance of bolus after 2-3 swallows.     Recommendations include upgrading pt's diet to dysphagia 2 (finely chopped) and thin liquids to optimize safety and efficiency for swallowing. SLP f/u to monitor tolerance of thin-liquids with compensatory strategies is highly recommended. Pt needs to remain upright for 30 minutes after PO intake due to remaining pharyngeal residue to optimize airway safety. Factors that may increase risk of adverse event in presence of aspiration [name] & Clearance [name] 2021): Factors that may increase risk of adverse event in presence of aspiration [name] & Clearance [name] 2021): Dependence for feeding and/or oral hygiene Recommendations/Plan: Swallowing Evaluation Recommendations Swallowing Evaluation Recommendations Recommendations: PO diet PO Diet Recommendation: Dysphagia 2 (Finely chopped); Thin liquids (Level 0) Liquid Administration via: Straw Medication Administration: Crushed with puree Supervision: Full supervision/cueing for swallowing strategies; Full assist for feeding Swallowing strategies  : Slow rate; Small bites/sips; Multiple dry swallows after each bite/sip; Clear throat intermittently Postural changes: Position pt fully upright for meals; Stay upright 30-60 min after meals Oral care  recommendations: Oral care BID (2x/day); Staff/trained caregiver to provide oral care Treatment Plan Treatment Plan Follow-up recommendations: Follow physicians's recommendations for discharge plan and follow up therapies Functional status assessment: Patient has had a recent decline in their functional status and demonstrates the ability to make significant improvements in function in a reasonable and predictable amount of time. Recommendations Recommendations for follow up therapy are one component of a multi-disciplinary discharge planning process, led by the attending physician.  Recommendations may be updated based on patient status, additional functional criteria and insurance authorization. Assessment: Orofacial Exam: Orofacial Exam Oral Cavity: Oral Hygiene: WFL Oral Cavity - Dentition: Adequate natural dentition Orofacial Anatomy: WFL Anatomy: Anatomy: WFL Boluses Administered: Boluses Administered Boluses Administered: Thin liquids (Level 0); Mildly thick liquids (Level 2, nectar thick); Moderately thick liquids (Level 3, honey thick); Puree; Solid  Oral Impairment Domain: Oral Impairment Domain Lip Closure: Interlabial escape, no progression to anterior lip Tongue control during bolus hold: Cohesive bolus between tongue to palatal seal Bolus preparation/mastication: Slow prolonged chewing/mashing with complete recollection Bolus transport/lingual motion: Brisk tongue motion Oral residue: Residue collection on oral structures Location of oral residue : Tongue Initiation of pharyngeal swallow : Pyriform sinuses  Pharyngeal Impairment Domain: Pharyngeal Impairment Domain Soft palate elevation: No bolus between soft palate (SP)/pharyngeal wall (PW) Laryngeal elevation: Partial superior movement of thyroid cartilage/partial approximation of arytenoids to epiglottic petiole Anterior hyoid excursion: Complete anterior movement Epiglottic movement: Complete inversion Laryngeal vestibule closure: Incomplete,  narrow column air/contrast in laryngeal vestibule Pharyngeal stripping wave : Present - complete Pharyngeal contraction (A/P view only): N/A Pharyngoesophageal segment opening: Complete distension and  complete duration, no obstruction of flow Tongue base retraction: Trace column of contrast or air between tongue base and PPW Pharyngeal residue: Collection of residue within or on pharyngeal structures Location of pharyngeal residue: Diffuse (>3 areas)  Esophageal Impairment Domain: Esophageal Impairment Domain Esophageal clearance upright position: -- (Not tested) Pill: No data recorded Penetration/Aspiration Scale Score: Penetration/Aspiration Scale Score 1.  Material does not enter airway: Solid; Puree; Moderately thick liquids (Level 3, honey thick) 2.  Material enters airway, remains ABOVE vocal cords then ejected out: Mildly thick liquids (Level 2, nectar thick) 5.  Material enters airway, CONTACTS cords and not ejected out: Thin liquids (Level 0) Compensatory Strategies: Compensatory Strategies Compensatory strategies: Yes Straw: Effective Effective Straw: Mildly thick liquid (Level 2, nectar thick) (Effective with slow rate applied.) Multiple swallows: Effective Effective Multiple Swallows: Thin liquid (Level 0); Mildly thick liquid (Level 2, nectar thick); Moderately thick liquid (Level 3, honey thick); Puree; Solid   General Information: Caregiver present: No  Diet Prior to this Study: Dysphagia 1 (pureed); Moderately thick liquids (Level 3, honey thick)   Temperature : Normal   Respiratory Status: WFL   Supplemental O2: None (Room air)   History of Recent Intubation: Yes  Behavior/Cognition: Alert; Cooperative; Pleasant mood; Requires cueing Self-Feeding Abilities: Needs assist with self-feeding Baseline vocal quality/speech: Normal Volitional Cough: Unable to elicit Volitional Swallow: Able to elicit Exam Limitations: No limitations Goal Planning: Prognosis for improved oropharyngeal function: Good  Barriers to Reach Goals: Language deficits No data recorded No data recorded No data recorded Pain: Pain Assessment Pain Assessment: No/denies pain Pain Score: 0 End of Session: Start Time:SLP Start Time (ACUTE ONLY): 1310 Stop Time: SLP Stop Time (ACUTE ONLY): 1335 Time Calculation:SLP Time Calculation (min) (ACUTE ONLY): 25 min Charges: SLP Evaluations $ SLP Speech Visit: 1 Visit SLP Evaluations $MBS Swallow: 1 Procedure $Swallowing Treatment: 1 Procedure SLP visit diagnosis: SLP Visit Diagnosis: Dysphagia, oropharyngeal phase (R13.12) Past Medical History: Past Medical History: Diagnosis Date  Anemia of chronic disease   Ankle fracture 02/15/2016  Cancer (HCC)   Prostate  Chronic constipation   Chronic kidney disease   stage III  Diabetes mellitus without complication (HCC)   type II   Diabetic peripheral neuropathy (HCC)   Failure to thrive (0-17)   Fracture of left lower leg   Gout   Hyperlipidemia   Hypertension   Morbid obesity (HCC)   Unstable gait  Past Surgical History: Past Surgical History: Procedure Laterality Date  IR CT HEAD LTD  06/05/2023  IR INTRAVSC STENT CERV CAROTID W/O EMB-PROT MOD SED INC ANGIO  06/05/2023  IR PERCUTANEOUS ART THROMBECTOMY/INFUSION INTRACRANIAL INC DIAG ANGIO  06/05/2023  IR US GUIDE VASC ACCESS RIGHT  06/05/2023  ORIF ANKLE FRACTURE Left 02/29/2016  Procedure: OPEN REDUCTION INTERNAL FIXATION (ORIF) ANKLE FRACTURE;  Surgeon: Yolonda Kida, MD;  Location: [facility];  Service: Orthopedics;  Laterality: Left;  PROSTATE SURGERY    RADIOLOGY WITH ANESTHESIA N/A 06/05/2023  Procedure: IR WITH ANESTHESIA;  Surgeon: Radiologist, Medication, MD;  Location: [facility];  Service: Radiology;  Laterality: N/A; Claudine Mouton 06/12/2023, 3:16 PM  DG Abd 1 View Result Date: 06/09/2023 CLINICAL DATA:  [id number]. Encounter for feeding tube placement. EXAM: ABDOMEN - 1 VIEW COMPARISON:  Abdomen film 06/05/2023. FINDINGS: Dobbhoff feeding tube is well placed with the radiopaque tip in the distal  stomach. The visualized bowel pattern is nonobstructive. There is barium newly seen in the flexures and transverse colon. There is no supine evidence of free air. There  is atelectasis in the lung bases. Cardiomegaly. IMPRESSION: 1. Dobbhoff feeding tube well placed with the radiopaque tip in the distal stomach. 2. Nonobstructive bowel gas pattern. 3. Cardiomegaly. Electronically Signed   By: Almira Bar M.D.   On: 06/09/2023 03:41   DG CHEST PORT 1 VIEW Result Date: 06/08/2023 CLINICAL DATA:  CHF EXAM: PORTABLE CHEST 1 VIEW COMPARISON:  01/06/2023 FINDINGS: Feeding tube in place. There is cardiomegaly with vascular congestion. Chronic elevation of the right hemidiaphragm with right base atelectasis. Interstitial prominence throughout the lungs, right greater than left could reflect interstitial edema. Possible small effusions. No acute bony abnormality. IMPRESSION: Cardiomegaly with vascular congestion and probable mild interstitial edema. Question small bilateral effusions. Chronic elevation of the right hemidiaphragm with right base atelectasis. Electronically Signed   By: Charlett Nose M.D.   On: 06/08/2023 18:20   DG Swallowing Func-Speech Pathology Result Date: 06/07/2023 Table formatting from the original result was not included. Modified Barium Swallow Study Study completed and documented by [name], SLP Student Supervised and reviewed by Harlon Ditty, MA CCC-SLP Acute Rehabilitation Services Secure Chat Preferred Office [phone] Patient Details Name: Nathan Proctor. MRN: [id number] Date of Birth: 01/31/1941 Today's Date: 06/07/2023 HPI/PMH: HPI: Pt is a 83 y.o. male who was admitted as a code stroke on 02/05 due to acute onset of right-sided weakness, right facial droop and aphasia. MRI from 02/06 displayed an evolving acute left MCA infarct, most pronounced at the left insula and left frontal operculum. Pt has intentionally lost 300 pounds in the recent past, with pre-loss weight at  ~600Ib. PMHx includes hypertension, diabetes, CKD, prior CVA. Clinical Impression: Clinical Impression: Pt presented with a moderate-severe oropharyngeal dysphagia (DIGEST Score: 3) primarily characterized by silent aspiration of thin liquid, silent penetration of nectar-thick liquid, diffuse pharyngeal residue collection during thin/nectar-thick consistencies, and posterior-escape of bolus across multiple consistencies. Pt positioning was a potential limitation for examination due to consistent posterior head position. More anterior head positioning during nectar-thick liquid displayed less pharyngeal residue, increased hyo-laryngeal excursion, and overall better airway safety. This positioning was not present throughout due to pt's receptive language deficits to understand cues. Alternating between honey-thick and puree consistencies provided more oropharyngeal bolus clearance and no signs of aspiration. Recommend a dysphagia 1 (puree) and honey-thick liquid diet to optimize pt's airway safety and potential for meeting nutritional needs. Slow rate, small bites/sips, natural head posturing, and multiple swallows are compensatory strategies that displayed best efficiency with pt. F/u with SLP for family education, compensatory strategy training, and upgraded diet trials is recommended. Factors that may increase risk of adverse event in presence of aspiration [name] & Clearance [name] 2021): Factors that may increase risk of adverse event in presence of aspiration [name] & Clearance [name] 2021): Weak cough Recommendations/Plan: Swallowing Evaluation Recommendations Swallowing Evaluation Recommendations Recommendations: PO diet PO Diet Recommendation: Dysphagia 1 (Pureed); Moderately thick liquids (Level 3, honey thick) Liquid Administration via: Spoon Medication Administration: Crushed with puree Supervision: Full supervision/cueing for swallowing strategies; Full assist for feeding Swallowing strategies  : Slow rate; Small  bites/sips; Multiple dry swallows after each bite/sip (Pt needs to be in a neutral head position (no posterior head tilt).) Postural changes: Position pt fully upright for meals; Stay upright 30-60 min after meals Oral care recommendations: Oral care BID (2x/day); Staff/trained caregiver to provide oral care Caregiver Recommendations: Have oral suction available Treatment Plan Treatment Plan Treatment recommendations: Therapy as outlined in treatment plan below Follow-up recommendations: Follow physicians's recommendations for discharge plan and follow up therapies  Functional status assessment: Patient has had a recent decline in their functional status and demonstrates the ability to make significant improvements in function in a reasonable and predictable amount of time. Treatment frequency: Min 2x/week Treatment duration: 2 weeks Interventions: Aspiration precaution training; Compensatory techniques; Patient/family education; Diet toleration management by SLP; Trials of upgraded texture/liquids Recommendations Recommendations for follow up therapy are one component of a multi-disciplinary discharge planning process, led by the attending physician.  Recommendations may be updated based on patient status, additional functional criteria and insurance authorization. Assessment: Orofacial Exam: Orofacial Exam Oral Cavity - Dentition: Adequate natural dentition Orofacial Anatomy: WFL Anatomy: Anatomy: WFL Boluses Administered: Boluses Administered Boluses Administered: Thin liquids (Level 0); Mildly thick liquids (Level 2, nectar thick); Moderately thick liquids (Level 3, honey thick); Puree; Solid  Oral Impairment Domain: Oral Impairment Domain Lip Closure: Escape progressing to mid-chin Tongue control during bolus hold: Posterior escape of greater than half of bolus Bolus preparation/mastication: -- (Pt did not chew solid and SLP had to remove it from mouth.) Bolus transport/lingual motion: Repetitive/disorganized  tongue motion Oral residue: Residue collection on oral structures Location of oral residue : Tongue; Palate Initiation of pharyngeal swallow : Pyriform sinuses  Pharyngeal Impairment Domain: Pharyngeal Impairment Domain Soft palate elevation: No bolus between soft palate (SP)/pharyngeal wall (PW) Laryngeal elevation: Partial superior movement of thyroid cartilage/partial approximation of arytenoids to epiglottic petiole Anterior hyoid excursion: Partial anterior movement Epiglottic movement: Partial inversion Laryngeal vestibule closure: Incomplete, narrow column air/contrast in laryngeal vestibule Pharyngeal stripping wave : Present - complete Pharyngeal contraction (A/P view only): N/A Pharyngoesophageal segment opening: Complete distension and complete duration, no obstruction of flow Tongue base retraction: Narrow column of contrast or air between tongue base and PPW Pharyngeal residue: Collection of residue within or on pharyngeal structures Location of pharyngeal residue: Diffuse (>3 areas)  Esophageal Impairment Domain: Esophageal Impairment Domain Esophageal clearance upright position: -- (Not tested.) Pill: No data recorded Penetration/Aspiration Scale Score: Penetration/Aspiration Scale Score 1.  Material does not enter airway: Puree; Moderately thick liquids (Level 3, honey thick) 3.  Material enters airway, remains ABOVE vocal cords and not ejected out: Mildly thick liquids (Level 2, nectar thick) 8.  Material enters airway, passes BELOW cords without attempt by patient to eject out (silent aspiration) : Thin liquids (Level 0) Compensatory Strategies: Compensatory Strategies Compensatory strategies: Yes Straw: Ineffective Multiple swallows: Effective (Required max cueing due to pt's language.) Effective Multiple Swallows: Puree; Moderately thick liquid (Level 3, honey thick); Mildly thick liquid (Level 2, nectar thick)   General Information: Caregiver present: No  Diet Prior to this Study: NPO    Temperature : Normal   Respiratory Status: WFL   Supplemental O2: None (Room air)   No data recorded Behavior/Cognition: Alert; Cooperative; Pleasant mood; Requires cueing Self-Feeding Abilities: Needs assist with self-feeding Baseline vocal quality/speech: Dysphonic Volitional Cough: Unable to elicit Volitional Swallow: Able to elicit Exam Limitations: Poor positioning Goal Planning: Prognosis for improved oropharyngeal function: Good Barriers to Reach Goals: Language deficits No data recorded No data recorded Consulted and agree with results and recommendations: Pt unable/family or caregiver not available Pain: Pain Assessment Pain Assessment: No/denies pain Faces Pain Scale: 0 Facial Expression: 0 Body Movements: 0 Muscle Tension: 0 Compliance with ventilator (intubated pts.): N/A Vocalization (extubated pts.): 0 CPOT Total: 0 End of Session: Start Time:SLP Start Time (ACUTE ONLY): 1610 Stop Time: SLP Stop Time (ACUTE ONLY): 0930 Time Calculation:SLP Time Calculation (min) (ACUTE ONLY): 27 min Charges: SLP Evaluations $ SLP Speech Visit: 1  Visit SLP Evaluations $BSS Swallow: 1 Procedure $MBS Swallow: 1 Procedure $ SLP EVAL LANGUAGE/SOUND PRODUCTION: 1 Procedure $Swallowing Treatment: 1 Procedure SLP visit diagnosis: SLP Visit Diagnosis: Dysphagia, oropharyngeal phase (R13.12) Past Medical History: Past Medical History: Diagnosis Date  Anemia of chronic disease   Ankle fracture 02/15/2016  Cancer (HCC)   Prostate  Chronic constipation   Chronic kidney disease   stage III  Diabetes mellitus without complication (HCC)   type II   Diabetic peripheral neuropathy (HCC)   Failure to thrive (0-17)   Fracture of left lower leg   Gout   Hyperlipidemia   Hypertension   Morbid obesity (HCC)   Unstable gait  Past Surgical History: Past Surgical History: Procedure Laterality Date  IR CT HEAD LTD  06/05/2023  IR INTRAVSC STENT CERV CAROTID W/O EMB-PROT MOD SED INC ANGIO  06/05/2023  IR PERCUTANEOUS ART THROMBECTOMY/INFUSION  INTRACRANIAL INC DIAG ANGIO  06/05/2023  IR US GUIDE VASC ACCESS RIGHT  06/05/2023  ORIF ANKLE FRACTURE Left 02/29/2016  Procedure: OPEN REDUCTION INTERNAL FIXATION (ORIF) ANKLE FRACTURE;  Surgeon: Yolonda Kida, MD;  Location: [facility];  Service: Orthopedics;  Laterality: Left;  PROSTATE SURGERY    RADIOLOGY WITH ANESTHESIA N/A 06/05/2023  Procedure: IR WITH ANESTHESIA;  Surgeon: Radiologist, Medication, MD;  Location: [facility];  Service: Radiology;  Laterality: N/A; [place], Riley Nearing 06/07/2023, 12:15 PM  CT HEAD WO CONTRAST ( ) Result Date: 06/07/2023 CLINICAL DATA:  84 year old male status post code stroke presentation, left MCA M2 occlusion, with left MCA infarct with confluent petechial hemorrhage. EXAM: CT HEAD WITHOUT CONTRAST TECHNIQUE: Contiguous axial images were obtained from the base of the skull through the vertex without intravenous contrast. RADIATION DOSE REDUCTION: This exam was performed according to the departmental dose-optimization program which includes automated exposure control, adjustment of the mA and/or kV according to patient size and/or use of iterative reconstruction technique. COMPARISON:  Brain MRI yesterday.  Head CT 06/05/2023. FINDINGS: Brain: Confluent mixed density infarct in the left MCA middle division, epicenter at the insula and operculum. Confluent petechial hemorrhage on series 3, image 16 appears stable from the MRI. Superimposed trace subarachnoid blood is difficult to exclude by CT (series 3, image 13), but was not apparent on MRI. Stable mild mass effect on the left lateral ventricle with only trace rightward midline shift (series 3, image 16). No ventriculomegaly. Elsewhere gray-white differentiation is stable from the presentation CT. Basilar cisterns remain patent. Vascular: Resolved hyperdense left MCA since presentation. Calcified atherosclerosis at the skull base. Skull: Stable, intact. Sinuses/Orbits: Visualized paranasal sinuses and mastoids are stable and  well aerated. Other: Left nasoenteric tube now in place. Stable orbit and scalp soft tissues. IMPRESSION: 1. Stable by CT confluent Left MCA middle division infarct with petechial hemorrhage (Heidelberg classification 1b: HI2, confluent petechiae, no mass effect). Questionable trace superimposed SAH, but was not apparent on MRI. 2. Stable minimal intracranial mass effect. 3. No new intracranial abnormality. Electronically Signed   By: Odessa Fleming M.D.   On: 06/07/2023 05:37    Microbiology Recent Results (from the past 240 hours)  Culture, Respiratory w Gram Stain     Status: None   Collection Time: 06/27/23 10:44 AM   Specimen: Tracheal Aspirate; Respiratory  Result Value Ref Range Status   Specimen Description TRACHEAL ASPIRATE  Final   Special Requests NONE  Final   Gram Stain NO WBC SEEN RARE GRAM POSITIVE COCCI   Final   Culture   Final    FEW Normal respiratory flora-no  Staph aureus or Pseudomonas seen Performed at [facility], [address], [place], [place] [address]    Report Status 06/30/2023 FINAL  Final    Lab Basic Metabolic Panel: Recent Labs  Lab 07/01/23 0419 07/02/23 0444 07/03/23 0458 07/03/23 0658 07/04/23 0410 07/04/23 0808 2023/08/01 0501  NA 131* 130* 129*  --  126* 128* 129*  K 4.7 5.1 5.6* 5.4* 6.6* 6.4* 6.9*  CL 101 101 98  --  102  --  99  CO2 22 19* 18*  --  17*  --  15*  GLUCOSE 210* 241* 302*  --  329*  --  184*  BUN 61* 84* 113*  --  131*  --  156*  CREATININE 4.15* 5.49* 6.44*  --  7.52*  --  8.55*  CALCIUM 8.9 9.0 9.3  --  8.3*  --  9.0  MG  --  2.5* 2.6*  --   --   --   --   PHOS  --   --  5.8*  --   --   --   --    Liver Function Tests: Recent Labs  Lab 07/02/23 0444  AST 33  ALT 27  ALKPHOS 47  BILITOT 1.3*  PROT 5.8*  ALBUMIN 1.7*   No results for input(s): "LIPASE", "AMYLASE" in the last 168 hours. No results for input(s): "AMMONIA" in the last 168 hours. CBC: Recent Labs  Lab 07/01/23 1848 07/02/23 0444 07/03/23 0458  07/04/23 0410 07/04/23 0808 01-Aug-2023 0501  WBC 9.4 9.5 9.4 12.5*  --  20.6*  HGB 7.4* 7.1* 7.3* 7.0* 7.5* 7.2*  HCT 24.0* 23.3* 23.7* 23.2* 22.0* 24.1*  MCV 94.1 97.1 96.0 99.6  --  100.4*  PLT 114* 108* 116* 190  --  230   Cardiac Enzymes: No results for input(s): "CKTOTAL", "CKMB", "CKMBINDEX", "TROPONINI" in the last 168 hours. Sepsis Labs: Recent Labs  Lab 07/02/23 0444 07/03/23 0458 07/04/23 0410 08/01/2023 0501  WBC 9.5 9.4 12.5* 20.6*     Justyne Roell Mechele Collin 07/06/2023, 6:51 PM

## 2023-07-30 NOTE — Progress Notes (Signed)
 0612 Pt having bradycardia with repositioning.  1 mg atropine given for HR 30s.

## 2023-07-30 NOTE — Progress Notes (Addendum)
 Nutrition Follow-up  DOCUMENTATION CODES:   Non-severe (moderate) malnutrition in context of chronic illness  INTERVENTION:    D/C EN regimen while pt is not stable. Currently maxed on pressors and MAPs remain <60. Pt is at high risk for ischemic bowel. Notified RN and Pharmacy for insulin adjustments.   Tube feeding via cortrak tube: Osmolite 1.5 by @ 50 ml/hr (1200 ml per day) Prosource TF20 60 ml BID Provides 1960 kcal, 115 gm protein, 912 ml free water daily  Rena-vit daily   NUTRITION DIAGNOSIS:   Moderate Malnutrition related to chronic illness as evidenced by severe muscle depletion, mild fat depletion, severe fat depletion. Ongoing.   GOAL:   Patient will meet greater than or equal to 90% of their needs TF at goal   MONITOR:   TF tolerance, Weight trends, Labs, Diet advancement  REASON FOR ASSESSMENT:   Consult Enteral/tube feeding initiation and management  ASSESSMENT:   Pt admitted for acute ischemic stroke with right sides weakness, right facial droop, and aphagia. Pt with PMH of HTN, DM, CKD III, prior CVA w/ no residual deficits.  Pt discussed during ICU rounds and with RN and NP.  Noted pt is now maxed out on pressors and MAPs remain 48-60. Will d/c EN due to risk of ischemic bowel.    2/05 - s/p IR mechanical thrombectomy and carotid stent placement, Cortrak placed (tip gastric) 2/07 - s/p MBS with recommendation for dysphagia 1 diet with honey-thick liquids, Transferred to progressive unit 2/09 - Cortrak and tube feeds discontinued 2/12 - repeat MBS with recommendation for dysphagia 2 diet with thin liquids 2/15 - Transferred to ICU for respiratory distress, Intubated  2/16 -  CT abdomen concerning for either close loop obstruction or internal hernia 2/18 - start trickle TF 2/21 - s/p cortrak placement, tip gastric; TF to goal  2/28 - TF held for emesis, high pressor requirement 3/1 - CRRT stopped 3/2 - trickle TF started 3/3 - start to  advance TF to goal  Medications reviewed and include: SSI every 4 hours, 10 units novolog every 4 hours, 25 units lantus daily, rena-vit, protonix, Lokelma BID Fentanyl  Versed Levophed @ 40 mcg Vasopressin 0.03  Labs reviewed:  Na 129 K 6.9 BUN 44 -> 61-> 184 Cr 1.74 -> 2.93 ->4.15 -> 8.55 CBG's: 96-195   UOP: 0 ml  Current weight: 144 kg Admission weight: 132.6 kg   Diet Order:   Diet Order             Diet NPO time specified  Diet effective now                   EDUCATION NEEDS:   Education needs have been addressed  Skin:  Skin Assessment: Skin Integrity Issues: Skin Integrity Issues:: Stage III Stage II: NA Stage III: L scrotum, sacrum Incisions: R thigh  Last BM:  3/7 via FMS  Height:   Ht Readings from Last 1 Encounters:  06/25/23 5\' 10"  (1.778 m)    Weight:   Wt Readings from Last 1 Encounters:  07/07/2023 (!) 144 kg    Ideal Body Weight:  68.2 kg  BMI:  Body mass index is 45.55 kg/m.  Estimated Nutritional Needs:   Kcal:  1950-2150 kcals  Protein:  115-130 grams  Fluid:  1.8 L  Jamaal Bernasconi P., RD, LDN, CNSC See AMiON for contact information

## 2023-07-30 NOTE — Inpatient Diabetes Management (Signed)
 Inpatient Diabetes Program Recommendations  AACE/ADA: New Consensus Statement on Inpatient Glycemic Control (2015)  Target Ranges:  Prepandial:   less than 140 mg/dL      Peak postprandial:   less than 180 mg/dL (1-2 hours)      Critically ill patients:  140 - 180 mg/dL    Latest Reference Range & Units 07/03/23 23:00 07/04/23 03:06 07/04/23 07:40 07/04/23 11:03 07/04/23 15:25 07/04/23 19:20  Glucose-Capillary 70 - 99 mg/dL 161 (H)  23 units Novolog  333 (H)  23 units Novolog  271 (H)  16 units Novolog  262 (H)  21 units Novolog  25 units Lantus  227 (H)  17 units Novolog 232 (H)  21 units Novolog   (H): Data is abnormally high  Latest Reference Range & Units 07/04/23 23:27 07/06/2023 03:12 06/30/2023 07:38  Glucose-Capillary 70 - 99 mg/dL 096 (H)  17 units Novolog  206 (H)  17 units Novolog  195 (H)  14 units Novolog   (H): Data is abnormally high    Current Orders: Lantus 25 units daily     Novolog Resistant Correction Scale/ SSI (0-20 units) Q4 hours     Novolog 10 units Q4 hours     MD- Please consider increasing the Novolog Tube Feed Coverage to 14 units Q4 hours     --Will follow patient during hospitalization--  Ambrose Finland RN, MSN, CDCES Diabetes Coordinator Inpatient Glycemic Control Team Team Pager: [phone] (8a-5p)

## 2023-07-30 NOTE — Progress Notes (Signed)
 Chaplain responded to a page from Nurse Amalia Hailey, whom referred Pt's wife, for a moment to talk and receive some emotional support from [place]. Chaplain and Pt's wife had a moment to talk. Pt's wife fears that she has to make decisions very quick and feels that she needs to discern what could be the best decisions for her husband. Chaplain offered a moment of reflective listening and Pt's wife shared about how her faith is helping her in coping with all the events in her husband. Pt's wife reflected on all these 46 years together with Pt. Chaplain offered compassionate presence, allowing Pt's wife to reflect in this difficult time for Pt's wife. Pt's wife asked for a moment to pray for her and her husband. Chaplain prayed with the family. Pt's wife was very grateful for this time to talk and share her feelings and receive emotional support.  Oneida Alar Chaplain Resident   07/18/2023 0326  Spiritual Encounters  Type of Visit Initial  Care provided to: Family;Pt not available  Conversation partners present during encounter Nurse  Referral source Clinical staff  Reason for visit Urgent spiritual support  OnCall Visit Yes  Spiritual Framework  Presenting Themes Values and beliefs;Significant life change;Coping tools  Community/Connection Family  Patient Stress Factors Exhausted;Health changes  Family Stress Factors None identified  Interventions  Spiritual Care Interventions Made Compassionate presence;Reflective listening;Decision-making support/facilitation;Explored values/beliefs/practices/strengths;Meaning making

## 2023-07-30 NOTE — Progress Notes (Signed)
 NAME:  Nathan Proctor., MRN:  [id number], DOB:  06-15-1940, LOS: 30 ADMISSION DATE:  06/05/2023 CONSULTATION DATE:  06/05/2023 REFERRING MD:  Otelia Limes - Neuro, CHIEF COMPLAINT: Code Stroke, post-NIR   History of Present Illness:  83 year old man who presented to [facility] ED 2/5 via EMS for unresponsiveness and R-sided deficits. PMHx significant for HTN, HLD, prior CVA without residual deficits, T2DM, CKD stage IIIb, prostate CA, gout.  Patient presented to [facility] ED with significant R-sided deficits, aphasia and poor responsiveness. LKW 2200. Code Stroke initiated. On ED arrival, patient was afebrile with HR 86, BP 154/67, RR 20, SpO2 100%. Noted aphasia, R-sided hemiplegia, R-sided facial droop. Labs were notable for WBC 6.8. Hgb 11.9, Plt 181. INR 1.0. Na 142, K 3.8, CO2 20, Cr 1.59 (baseline), LFTs WNL. Ethanol < 10. COVID negative. CT Head with acute L MCA territory infarct affecting L insula/L frontal operculum, small chronic cortically-based infarcts of L frontal/L parietal/L occipital lobes. CTA Head/Neck with abrupt occlusion of proximal M2 L MCA vessel, ?2mm periophthalmic L ICA aneurysm, bilateral ICA plaque with progressive atherosclerotic plaque L carotid bifurcation with proximal L ICA severe, near-occlusive stenosis.   Taken to [facility] emergently for mechanical thrombectomy ([name]). TICI 3 revascularization achieved. L ICA stenosis and plaque rupture noted at the bulb, treated with stenting/angioplasty. Extubated post-procedure and maintained on cangrelor gtt with plan for transition to DAPT.  PCCM consulted for post-procedure medical management.  Pertinent Medical History:   Past Medical History:  Diagnosis Date   Anemia of chronic disease    Ankle fracture 02/15/2016   Cancer (HCC)    Prostate   Chronic constipation    Chronic kidney disease    stage III   Diabetes mellitus without complication (HCC)    type II    Diabetic peripheral neuropathy (HCC)    Failure to  thrive (0-17)    Fracture of left lower leg    Gout    Hyperlipidemia    Hypertension    Morbid obesity (HCC)    Unstable gait    Significant Hospital Events: Including procedures, antibiotic start and stop dates in addition to other pertinent events   2/5 - Presented to [facility] ED with poor responsiveness, aphasia, R-sided deficits. CT Head with L MCA territory infarct. CTA Head/Neck with occlusion of L MCA M2 segment and severe near-occlusive stenosis of L ICA. Taken to [facility] for MT, stent and angioplasty ([name]). PCCM consulted for post-procedure management. 2/6 transfer out of ICU 2/15 aspiration event early a.m. resulting in respiratory distress, transferred back to ICU and emergently intubated with development of severe shock requiring epi, levo, vaso. Transfused PRBC x1 for possible GI bleed 2/16: 3 pressors, weaning. Coox without cardiogenic component. Antibiotics vanc/cefepime. CT CAP with SBO  2/18: weaned off to only levo now which is likely sedation related  2/20: UOP drop overnight with ongoing poor renal function. Weaning sedation to evaluate neuro status.  2/22: started on trial of CRRT 2/23: HD catheter exchanged for longer one - flow improved. More awake by afternoon 2/24 tried to wean Precedex and pt got quite uncomfortable/agitated. Required Fentanyl infusion addition. Got hypotensive later in day, increase in NE  2/25 NE back down and almost off. Started pulling 50cc/hr from CRRT 2/26 remains on NE 11, CRRT ongoing 2/27: levo going up to 24, on vaso. CRRT net even. Made DNR today yesterday.  Received 2 units of PRBCs 3/1: CRRT turned off as blood pressure could not tolerate with increasing  pressor requirements 3/4 Repeat CT head with no acute changes but evolving left MCA infarcts with petechial hemorrhage and laminar necrosis 3/6 Worsening vasopressor requirements. Renal function continues to progress  Interim History / Subjective:   Maxed on vasopressors and  remains hypotensive. Family called overnight Wife discussed care with chaplain regarding decision making  Objective:  Blood pressure (!) 82/42, pulse 87, temperature 98.4 F (36.9 C), temperature source Axillary, resp. rate (!) 22, height 5\' 10"  (1.778 m), weight (!) 144 kg, SpO2 99%.    Vent Mode: PRVC FiO2 (%):  [60 %] 60 % Set Rate:  [22 bmp] 22 bmp Vt Set:  [580 mL] 580 mL PEEP:  [8 cmH20] 8 cmH20 Plateau Pressure:  [20 cmH20-22 cmH20] 22 cmH20   Intake/Output Summary (Last 24 hours) at 07/01/2023 0911 Last data filed at 07/10/2023 0800 Gross per 24 hour  Intake 2341.58 ml  Output --  Net 2341.58 ml   Filed Weights   07/03/23 0500 07/04/23 0500 07/08/2023 0700  Weight: (!) 144.1 kg (!) 144 kg (!) 144 kg   Physical Exam: General: Critically ill-appearing, sedated, hypotensive HENT: Smoot, AT, ETT in place Eyes: EOMI, no scleral icterus Respiratory: Diminished to auscultation bilaterally.  No crackles, wheezing or rales Cardiovascular: RRR, -M/R/G, no JVD GI: BS+, soft, nontender Extremities:Anasarca,-tenderness Neuro: Sedated, PERRL GU: External foley in place  Imaging, labs and test noted above have been reviewed independently by me. K 6.9 CO2 15 BUN/Cr 156/8.55  CXR 3/1 Bilateral pleural effusions with bibasilar atelectasis, interstitial edema improved compared to prior   Assessment & Plan:Resolved Hospital Problem List   Acute encephalopathy - multifactorial in setting of critical illness, metabolic derangements Left MCA M2 occlusion and left ICA near occlusion now status post thrombectomy with complete revascularization Acute hypoxemic respiratory failure due to aspiration Klebsiella pneumonia dx BAL 2/15- treated.  Ileus - resolved Heart failure with reduced ejection fraction ejection fraction of 30% with global hypokinesis, normal right ventricular function Hypertension Hyperlipidemia Type 2 diabetes with hyperglycemia Acute kidney injury superimposed on chronic  kidney disease stage IIIb - worsening Acute blood loss anemia, unclear etiology; may be stress ulcer related vs critical illness. No bloody BM, or OGT output.  Goals of care - poor prognosis for meaningful recovery and could be headed towards trach and possibly ESRD requiring HD Hypervolemic hyponatremia  Plan: Remains in [facility] worsening renal failure and now shock on max pressors. Unable to tolerate CRRT due to refractory shock. Not a candidate for long term iHD. No further dialysis per GOC and futility of treatment Hyperkalemia protocol re-ordered: Insulin, albuterol, sodium bicarb. Give additional bicarb amps x 2 this am. Avoiding additional IVF resuscitation due to volume overload status Increased lokelma BID for hyperkalemia Not a candidate for weaning due to unstable critical condition Continue to wean pressors for goal MAP >65. Currently hypotensive on max doses Minimize sedation for RASS goal -1 Holding plavix for acute blood loss anemia. S/p PRBC x 1. F/u post-transfusion H/H  Goals of care Discussed with family, nephrology, palliative care. Will continue GOC discussions We have stopped CRRT as he is unable to tolerate it due to hemodynamic status with no plans to resume Wife expresses understanding and is considering withdrawal of care but do not want to transition him to comfort measures yet. Awaiting family to come together They confirm no CPR and defib but they want him to remain on the ventilator for now.  Overnight family was called for refractory hypotension despite maximal efforts.   Best Practice (  right click and "Reselect all SmartList Selections" daily)   Diet/type: tubefeeds and NPO DVT prophylaxis SCD Pressure ulcer(s): N/A GI prophylaxis: PPI Lines: Central line and yes and it is still needed Foley:  Yes, and it is still needed Code Status:  DNR Last date of multidisciplinary goals of care discussion: ongoing palliative discussion. DNR   The patient is  critically ill with multiple organ systems failure and requires high complexity decision making for assessment and support, frequent evaluation and titration of therapies, application of advanced monitoring technologies and extensive interpretation of multiple databases.  Independent Critical Care Time: 46 Minutes.   Mechele Collin, M.D. [facility] Pulmonary/Critical Care Medicine 07/10/2023 9:34 AM   Please see Amion for pager number to reach on-call Pulmonary and Critical Care Team.

## 2023-07-30 DEATH — deceased
